# Patient Record
Sex: Female | Born: 1945 | Race: White | Hispanic: No | Marital: Married | Smoking: Former smoker
Health system: Southern US, Community
[De-identification: ages and names within clinical notes are randomized; demographics above are authoritative.]

## PROBLEM LIST (undated history)

## (undated) ENCOUNTER — Emergency Department: Admission: EM | Discharge status: Absent without leave | Payer: Medicare Other | Source: Home / Self Care

## (undated) DIAGNOSIS — D649 Anemia, unspecified: Secondary | ICD-10-CM

## (undated) DIAGNOSIS — Z8701 Personal history of pneumonia (recurrent): Secondary | ICD-10-CM

## (undated) DIAGNOSIS — R42 Dizziness and giddiness: Secondary | ICD-10-CM

## (undated) DIAGNOSIS — R011 Cardiac murmur, unspecified: Secondary | ICD-10-CM

## (undated) DIAGNOSIS — E119 Type 2 diabetes mellitus without complications: Secondary | ICD-10-CM

## (undated) DIAGNOSIS — E7800 Pure hypercholesterolemia, unspecified: Secondary | ICD-10-CM

## (undated) DIAGNOSIS — K589 Irritable bowel syndrome without diarrhea: Secondary | ICD-10-CM

## (undated) DIAGNOSIS — R51 Headache: Secondary | ICD-10-CM

## (undated) DIAGNOSIS — Z923 Personal history of irradiation: Secondary | ICD-10-CM

## (undated) DIAGNOSIS — H8109 Meniere's disease, unspecified ear: Secondary | ICD-10-CM

## (undated) DIAGNOSIS — K449 Diaphragmatic hernia without obstruction or gangrene: Secondary | ICD-10-CM

## (undated) DIAGNOSIS — C3412 Malignant neoplasm of upper lobe, left bronchus or lung: Secondary | ICD-10-CM

## (undated) DIAGNOSIS — Z9221 Personal history of antineoplastic chemotherapy: Secondary | ICD-10-CM

## (undated) DIAGNOSIS — G4730 Sleep apnea, unspecified: Secondary | ICD-10-CM

## (undated) DIAGNOSIS — M51369 Other intervertebral disc degeneration, lumbar region without mention of lumbar back pain or lower extremity pain: Secondary | ICD-10-CM

## (undated) DIAGNOSIS — K8020 Calculus of gallbladder without cholecystitis without obstruction: Secondary | ICD-10-CM

## (undated) DIAGNOSIS — Z9889 Other specified postprocedural states: Secondary | ICD-10-CM

## (undated) DIAGNOSIS — J45909 Unspecified asthma, uncomplicated: Secondary | ICD-10-CM

## (undated) DIAGNOSIS — K219 Gastro-esophageal reflux disease without esophagitis: Secondary | ICD-10-CM

## (undated) DIAGNOSIS — K5790 Diverticulosis of intestine, part unspecified, without perforation or abscess without bleeding: Secondary | ICD-10-CM

## (undated) DIAGNOSIS — J449 Chronic obstructive pulmonary disease, unspecified: Secondary | ICD-10-CM

## (undated) DIAGNOSIS — R112 Nausea with vomiting, unspecified: Secondary | ICD-10-CM

## (undated) DIAGNOSIS — M1990 Unspecified osteoarthritis, unspecified site: Secondary | ICD-10-CM

## (undated) DIAGNOSIS — M5136 Other intervertebral disc degeneration, lumbar region: Secondary | ICD-10-CM

## (undated) DIAGNOSIS — Z8489 Family history of other specified conditions: Secondary | ICD-10-CM

## (undated) DIAGNOSIS — J479 Bronchiectasis, uncomplicated: Secondary | ICD-10-CM

## (undated) DIAGNOSIS — R531 Weakness: Secondary | ICD-10-CM

## (undated) DIAGNOSIS — R519 Headache, unspecified: Secondary | ICD-10-CM

## (undated) DIAGNOSIS — G8929 Other chronic pain: Secondary | ICD-10-CM

## (undated) DIAGNOSIS — Z87442 Personal history of urinary calculi: Secondary | ICD-10-CM

## (undated) HISTORY — DX: Diverticulosis of intestine, part unspecified, without perforation or abscess without bleeding: K57.90

## (undated) HISTORY — DX: Sleep apnea, unspecified: G47.30

## (undated) HISTORY — PX: JOINT REPLACEMENT: SHX530

## (undated) HISTORY — DX: Pure hypercholesterolemia, unspecified: E78.00

## (undated) HISTORY — DX: Other chronic pain: G89.29

## (undated) HISTORY — PX: BREAST CYST ASPIRATION: SHX578

## (undated) HISTORY — DX: Meniere's disease, unspecified ear: H81.09

## (undated) HISTORY — PX: BREAST SURGERY: SHX581

## (undated) HISTORY — DX: Unspecified asthma, uncomplicated: J45.909

## (undated) HISTORY — PX: ABDOMINAL HYSTERECTOMY: SHX81

## (undated) HISTORY — DX: Headache, unspecified: R51.9

## (undated) HISTORY — PX: EXCISIONAL HEMORRHOIDECTOMY: SHX1541

## (undated) HISTORY — DX: Gastro-esophageal reflux disease without esophagitis: K21.9

## (undated) HISTORY — PX: RIGHT OOPHORECTOMY: SHX2359

## (undated) HISTORY — DX: Unspecified osteoarthritis, unspecified site: M19.90

## (undated) HISTORY — DX: Bronchiectasis, uncomplicated: J47.9

## (undated) HISTORY — PX: CHOLECYSTECTOMY: SHX55

## (undated) HISTORY — PX: BLADDER SURGERY: SHX569

## (undated) HISTORY — PX: APPENDECTOMY: SHX54

## (undated) HISTORY — DX: Headache: R51

## (undated) HISTORY — DX: Diaphragmatic hernia without obstruction or gangrene: K44.9

## (undated) HISTORY — PX: BACK SURGERY: SHX140

## (undated) HISTORY — PX: LUMBAR LAMINECTOMY: SHX95

## (undated) HISTORY — DX: Dizziness and giddiness: R42

## (undated) MED FILL — Dexamethasone Sodium Phosphate Inj 100 MG/10ML: INTRAMUSCULAR | Qty: 1 | Status: AC

---
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
## 1986-03-14 HISTORY — PX: BACK SURGERY: SHX140

## 1988-03-14 HISTORY — PX: REDUCTION MAMMAPLASTY: SUR839

## 2004-01-16 ENCOUNTER — Ambulatory Visit: Payer: Self-pay | Admitting: Internal Medicine

## 2004-03-31 ENCOUNTER — Ambulatory Visit: Payer: Self-pay | Admitting: Urology

## 2004-05-04 ENCOUNTER — Ambulatory Visit: Payer: Self-pay | Admitting: Internal Medicine

## 2005-05-05 ENCOUNTER — Ambulatory Visit: Payer: Self-pay | Admitting: Internal Medicine

## 2005-05-20 ENCOUNTER — Ambulatory Visit: Payer: Self-pay | Admitting: Urology

## 2005-08-17 ENCOUNTER — Emergency Department: Payer: Self-pay | Admitting: Unknown Physician Specialty

## 2005-08-17 ENCOUNTER — Other Ambulatory Visit: Payer: Self-pay

## 2005-10-12 ENCOUNTER — Ambulatory Visit: Payer: Self-pay | Admitting: Pain Medicine

## 2005-10-21 ENCOUNTER — Ambulatory Visit: Payer: Self-pay | Admitting: Pain Medicine

## 2006-02-22 ENCOUNTER — Ambulatory Visit: Payer: Self-pay | Admitting: Internal Medicine

## 2006-05-09 ENCOUNTER — Ambulatory Visit: Payer: Self-pay | Admitting: Internal Medicine

## 2007-07-13 ENCOUNTER — Inpatient Hospital Stay: Payer: Self-pay | Admitting: Specialist

## 2007-07-13 ENCOUNTER — Other Ambulatory Visit: Payer: Self-pay

## 2007-08-23 ENCOUNTER — Ambulatory Visit: Payer: Self-pay | Admitting: Internal Medicine

## 2007-10-16 ENCOUNTER — Ambulatory Visit: Payer: Self-pay | Admitting: Gastroenterology

## 2007-12-13 ENCOUNTER — Ambulatory Visit: Payer: Self-pay | Admitting: Gynecologic Oncology

## 2008-01-01 ENCOUNTER — Ambulatory Visit: Payer: Self-pay | Admitting: Gynecologic Oncology

## 2008-01-04 ENCOUNTER — Ambulatory Visit: Payer: Self-pay | Admitting: General Practice

## 2008-01-09 ENCOUNTER — Ambulatory Visit: Payer: Self-pay | Admitting: Gynecologic Oncology

## 2008-01-13 ENCOUNTER — Ambulatory Visit: Payer: Self-pay | Admitting: Gynecologic Oncology

## 2008-01-16 ENCOUNTER — Ambulatory Visit: Payer: Self-pay | Admitting: Gynecologic Oncology

## 2008-01-22 ENCOUNTER — Ambulatory Visit: Payer: Self-pay | Admitting: Gynecologic Oncology

## 2008-02-12 ENCOUNTER — Ambulatory Visit: Payer: Self-pay | Admitting: Gynecologic Oncology

## 2008-02-26 ENCOUNTER — Ambulatory Visit: Payer: Self-pay | Admitting: Gynecologic Oncology

## 2008-09-09 ENCOUNTER — Ambulatory Visit: Payer: Self-pay | Admitting: Internal Medicine

## 2009-01-31 ENCOUNTER — Emergency Department: Payer: Self-pay | Admitting: Emergency Medicine

## 2009-07-15 ENCOUNTER — Ambulatory Visit: Payer: Self-pay | Admitting: Urology

## 2009-07-28 ENCOUNTER — Ambulatory Visit: Payer: Self-pay | Admitting: Unknown Physician Specialty

## 2009-09-30 ENCOUNTER — Ambulatory Visit: Payer: Self-pay | Admitting: Internal Medicine

## 2010-03-16 ENCOUNTER — Ambulatory Visit: Payer: Self-pay | Admitting: Internal Medicine

## 2010-04-14 DEATH — deceased

## 2010-09-05 ENCOUNTER — Emergency Department: Payer: Self-pay | Admitting: Internal Medicine

## 2010-10-20 ENCOUNTER — Ambulatory Visit: Payer: Self-pay | Admitting: Internal Medicine

## 2011-01-14 ENCOUNTER — Ambulatory Visit: Payer: Self-pay | Admitting: Internal Medicine

## 2011-07-05 ENCOUNTER — Ambulatory Visit: Payer: Self-pay | Admitting: Specialist

## 2011-09-21 ENCOUNTER — Ambulatory Visit: Payer: Self-pay | Admitting: Internal Medicine

## 2011-09-21 ENCOUNTER — Emergency Department: Payer: Self-pay | Admitting: Unknown Physician Specialty

## 2011-09-22 LAB — BASIC METABOLIC PANEL
Anion Gap: 9 (ref 7–16)
BUN: 20 mg/dL — ABNORMAL HIGH (ref 7–18)
Calcium, Total: 9.2 mg/dL (ref 8.5–10.1)
Chloride: 106 mmol/L (ref 98–107)
Co2: 29 mmol/L (ref 21–32)
Creatinine: 0.62 mg/dL (ref 0.60–1.30)
EGFR (African American): >60
EGFR (Non-African Amer.): >60
Glucose: 105 mg/dL — ABNORMAL HIGH (ref 65–99)
Osmolality: 290 (ref 275–301)
Potassium: 4.7 mmol/L (ref 3.5–5.1)
Sodium: 144 mmol/L (ref 136–145)

## 2011-09-22 LAB — CBC
HCT: 37 % (ref 35.0–47.0)
HGB: 12.2 g/dL (ref 12.0–16.0)
MCH: 30.6 pg (ref 26.0–34.0)
MCHC: 32.9 g/dL (ref 32.0–36.0)
MCV: 93 fL (ref 80–100)
Platelet: 241 10*3/uL (ref 150–440)
RBC: 3.98 10*6/uL (ref 3.80–5.20)
RDW: 13.4 % (ref 11.5–14.5)
WBC: 4.4 10*3/uL (ref 3.6–11.0)

## 2011-09-22 LAB — LIPASE, BLOOD: Lipase: 223 U/L (ref 73–393)

## 2011-09-22 LAB — HEPATIC FUNCTION PANEL A (ARMC)
Albumin: 3.8 g/dL (ref 3.4–5.0)
Alkaline Phosphatase: 103 U/L (ref 50–136)
Bilirubin, Direct: <0.1 mg/dL (ref 0.00–0.20)
Bilirubin,Total: 0.2 mg/dL (ref 0.2–1.0)
SGOT(AST): 22 U/L (ref 15–37)
SGPT (ALT): 20 U/L
Total Protein: 6.4 g/dL (ref 6.4–8.2)

## 2011-09-22 LAB — TROPONIN I: Troponin-I: <0.02 ng/mL

## 2011-09-22 LAB — CK TOTAL AND CKMB (NOT AT ARMC)
CK, Total: 65 U/L (ref 21–215)
CK-MB: 1.1 ng/mL (ref 0.5–3.6)

## 2011-10-17 ENCOUNTER — Ambulatory Visit: Payer: Self-pay | Admitting: Gastroenterology

## 2011-10-21 ENCOUNTER — Ambulatory Visit: Payer: Self-pay | Admitting: Internal Medicine

## 2011-12-28 ENCOUNTER — Ambulatory Visit: Payer: Self-pay | Admitting: Gastroenterology

## 2012-01-10 ENCOUNTER — Ambulatory Visit: Payer: Self-pay | Admitting: Gastroenterology

## 2012-01-11 ENCOUNTER — Telehealth: Payer: Self-pay | Admitting: Internal Medicine

## 2012-01-11 LAB — PATHOLOGY REPORT

## 2012-01-11 NOTE — Telephone Encounter (Signed)
You can schedule her for 11/10 at 12:00 to discuss.  Thanks.

## 2012-01-11 NOTE — Telephone Encounter (Signed)
Appointment should be nov 13 @ 12

## 2012-01-11 NOTE — Telephone Encounter (Signed)
Spoke with Pt and scheduled her for 01/23/12 at 12:00  because 01/22/12 is a Sunday. She was fine with that.

## 2012-01-11 NOTE — Telephone Encounter (Signed)
Pt called wanting to get appointment with dr Lorin Picket She had a kidney left ultra sound, level for stomach  Cancer were elvated (labs) colonoscopy done yesterday Dr Kathryne Hitch (at hospital [facility]) order these test Please advise

## 2012-01-23 ENCOUNTER — Encounter: Payer: Self-pay | Admitting: Internal Medicine

## 2012-01-23 ENCOUNTER — Ambulatory Visit (INDEPENDENT_AMBULATORY_CARE_PROVIDER_SITE_OTHER): Payer: Medicare Other | Admitting: Internal Medicine

## 2012-01-23 VITALS — BP 113/73 | HR 73 | Temp 98.5°F | Ht 63.0 in | Wt 143.8 lb

## 2012-01-23 DIAGNOSIS — R350 Frequency of micturition: Secondary | ICD-10-CM

## 2012-01-23 DIAGNOSIS — R51 Headache: Secondary | ICD-10-CM

## 2012-01-23 DIAGNOSIS — K219 Gastro-esophageal reflux disease without esophagitis: Secondary | ICD-10-CM

## 2012-01-23 DIAGNOSIS — J479 Bronchiectasis, uncomplicated: Secondary | ICD-10-CM

## 2012-01-23 DIAGNOSIS — G8929 Other chronic pain: Secondary | ICD-10-CM

## 2012-01-23 DIAGNOSIS — G4730 Sleep apnea, unspecified: Secondary | ICD-10-CM

## 2012-01-23 DIAGNOSIS — R519 Headache, unspecified: Secondary | ICD-10-CM

## 2012-01-23 DIAGNOSIS — E7800 Pure hypercholesterolemia, unspecified: Secondary | ICD-10-CM

## 2012-01-23 DIAGNOSIS — M5030 Other cervical disc degeneration, unspecified cervical region: Secondary | ICD-10-CM

## 2012-01-23 LAB — POCT URINALYSIS DIPSTICK
Bilirubin, UA: NEGATIVE
Blood, UA: NEGATIVE
Glucose, UA: NEGATIVE
Ketones, UA: NEGATIVE
Leukocytes, UA: NEGATIVE
Nitrite, UA: NEGATIVE
Protein, UA: NEGATIVE
Spec Grav, UA: 1.02
Urobilinogen, UA: 0.2
pH, UA: 6.5

## 2012-01-23 NOTE — Patient Instructions (Addendum)
It was nice seeing you today.  I am sorry you have not been feeling well.  I am going to talk to Dr Doylene Canning and will let you know what he says.

## 2012-01-24 ENCOUNTER — Telehealth: Payer: Self-pay | Admitting: *Deleted

## 2012-01-24 NOTE — Telephone Encounter (Signed)
Opened in error

## 2012-01-24 NOTE — Progress Notes (Signed)
Left message for patient to return call.

## 2012-01-30 NOTE — Progress Notes (Signed)
Called and gave lab results to patient.  

## 2012-02-01 ENCOUNTER — Telehealth: Payer: Self-pay | Admitting: Internal Medicine

## 2012-02-01 DIAGNOSIS — R109 Unspecified abdominal pain: Secondary | ICD-10-CM

## 2012-02-01 DIAGNOSIS — R772 Abnormality of alphafetoprotein: Secondary | ICD-10-CM

## 2012-02-01 DIAGNOSIS — Z139 Encounter for screening, unspecified: Secondary | ICD-10-CM

## 2012-02-01 DIAGNOSIS — K838 Other specified diseases of biliary tract: Secondary | ICD-10-CM

## 2012-02-01 DIAGNOSIS — R935 Abnormal findings on diagnostic imaging of other abdominal regions, including retroperitoneum: Secondary | ICD-10-CM

## 2012-02-01 NOTE — Telephone Encounter (Signed)
Called patient to let her know we could not perform one of the labs she needed and that she can come by and get the lab form from up front.

## 2012-02-01 NOTE — Telephone Encounter (Signed)
Needs ct scheduled.

## 2012-02-02 ENCOUNTER — Other Ambulatory Visit: Payer: Medicare Other

## 2012-02-05 ENCOUNTER — Encounter: Payer: Self-pay | Admitting: Internal Medicine

## 2012-02-05 DIAGNOSIS — J479 Bronchiectasis, uncomplicated: Secondary | ICD-10-CM | POA: Insufficient documentation

## 2012-02-05 DIAGNOSIS — M5030 Other cervical disc degeneration, unspecified cervical region: Secondary | ICD-10-CM | POA: Insufficient documentation

## 2012-02-05 DIAGNOSIS — K219 Gastro-esophageal reflux disease without esophagitis: Secondary | ICD-10-CM | POA: Insufficient documentation

## 2012-02-05 DIAGNOSIS — G8929 Other chronic pain: Secondary | ICD-10-CM | POA: Insufficient documentation

## 2012-02-05 DIAGNOSIS — G4730 Sleep apnea, unspecified: Secondary | ICD-10-CM | POA: Insufficient documentation

## 2012-02-05 DIAGNOSIS — E7800 Pure hypercholesterolemia, unspecified: Secondary | ICD-10-CM | POA: Insufficient documentation

## 2012-02-05 NOTE — Assessment & Plan Note (Signed)
Followed at the [facility] clinic.

## 2012-02-05 NOTE — Progress Notes (Signed)
Subjective:    Patient ID: Jennifer Hodges, female    DOB: [birth year], 66 y.o.   MRN: [id number]  HPI 66 year old female with past history of hiatal hernia, esophageal ulceration, asthma, sleep apnea and mild bronchiectasis who comes in today as a work in to discuss a recent GI evaluation.  Back in July, she was having intermittent nausea, vomiting, etc.  Abdominal ultrasound was performed and revealed dilated common bile duct.  They suggested a possible ERCP.  She was referred to GI.  Saw [employer].  MRCP 10/17/11 revealed mild distension of biliary system most likely from prior cholecystectomy.  No obstructing abnormality.  She did have an indeterminate left renal lesion.  Renal ultrasound recommended.  CEA normal.  AFP slightly elevated - 11. Had a normal renal ultrasound 10/13.  Colonoscopy 01/10/12 revealed diverticulosis in the sigmoid colon and in the descending colon - otherwise normal.  Recommended repeat colonoscopy 10 years.  10/31/11 CXR normal.  Liver panel wnl.  She was referred here for evaluation of the slightly elevated AFP.  Repeat AFP - 11.5.  Bowels are doing better.  No significant abdominal pain currently.  She has noticed some decreased energy and some fatigue.  Eating and drinking ok.    Past Medical History  Diagnosis Date  . Hypercholesterolemia   . Sleep apnea   . Bronchiectasis     mild  . Asthma   . GERD (gastroesophageal reflux disease)     EGD 8/09- non bleeding erosive gastritis, documentd esophageal ulcerations.   . Diverticulosis   . Hiatal hernia   . Osteoarthritis     lumbar disc disease, left hip  . Chronic headaches     [facility]    Review of Systems Patient denies any headache, lightheadedness or dizziness.  No significant sinus or allergy symptoms.   No chest pain, tightness or palpitations.  No increased shortness of breath, cough or congestion.  No nausea or vomiting currently.  No significant abdominal pain or cramping  currently.  Bowels vary.  No urine change.        Objective:   Physical Exam Filed Vitals:   01/23/12 1159  BP: 113/73  Pulse: 73  Temp: 98.5 F (72.48 C)   66 year old female in no acute distress.   HEENT:  Nares - clear.  OP- without lesions or erythema.  NECK:  Supple, nontender.    HEART:  Appears to be regular. LUNGS:  Without crackles or wheezing audible.  Respirations even and unlabored.   RADIAL PULSE:  Equal bilaterally.  ABDOMEN:  Soft, nontender.  No audible abdominal bruit.  GU:  Normal external genitalia.  Vaginal vault without lesions.  Could not appreciate any adnexal masses or tenderness.    EXTREMITIES:  No increased edema to be present.                    Assessment & Plan:  ELEVATED AFP.  Nonspecific finding.  CEA normal.  Recent abdominal ultrasound, MRCP and colonoscopy as outlined.  Discussed with Dr Doylene Canning.  Will obtain CT abdomen given the intermittent abdominal pain, nausea, vomiting, elevated AFP and dilated common bile duct.  Also check CA 19-9.  If normal, will recheck AFP in 6 months.  Follow.  Currently symptoms have improved.    CARDIOVASCULAR.  Recent stress test 08/03/11 - EF 62% with no evidence of ischemia.  ECHO 08/04/11 revealed EF 60%, mild RAE and mild TR.  Sees Dr Lady Gary.  Currently asymptomatic.   GU/GYN.  Pelvic today.  She is s/p surgery for vaginal prolapse.  Also had a bladder suspension.  Sees Dr Achilles Dunk and GYN.  Follow.    HEALTH MAINTENANCE.  Physical 09/20/11.  GI as outlined.  S/P hysterectomy.  Mammogram 10/20/10 - Birads II.  Obtain latest mammo.  If not done for 2013 - will need to schedule.

## 2012-02-05 NOTE — Assessment & Plan Note (Signed)
CPAP.  Nasal cannula.  Follow.    

## 2012-02-05 NOTE — Assessment & Plan Note (Signed)
Has neck, shoulder and arm pain.  C-spine MRI revealed multilevel degenerative disc disease.  Takes flexeril and tramadol prn.  Follow.    

## 2012-02-05 NOTE — Assessment & Plan Note (Signed)
On Protonix.  Symptoms controlled currently.  Follow.  EGD 8/09 revealed non bleeding erosive gastritis with gastric mucosal abnormality.   

## 2012-02-05 NOTE — Assessment & Plan Note (Signed)
Low cholesterol diet and exercise.  Check lipid panel with next fasting labs.    

## 2012-02-05 NOTE — Assessment & Plan Note (Signed)
Mild.  Sees Dr Fleming.  CXR recently - no acute abnormality.    

## 2012-02-06 ENCOUNTER — Telehealth: Payer: Self-pay | Admitting: Internal Medicine

## 2012-02-06 NOTE — Telephone Encounter (Signed)
Patient wanting results from her mammogram.

## 2012-02-06 NOTE — Telephone Encounter (Signed)
Notify pt her CA 19-9 lab test is normal.  Ok.

## 2012-02-07 ENCOUNTER — Telehealth: Payer: Self-pay | Admitting: Internal Medicine

## 2012-02-07 NOTE — Telephone Encounter (Signed)
Called and gave lab results to patient.  

## 2012-02-07 NOTE — Telephone Encounter (Signed)
Notify pt we received a copy of her mammogram that was done 10/21/11.  You had said she called wanting results.  mammo ok.

## 2012-02-07 NOTE — Telephone Encounter (Signed)
Informed patient of mammogram results

## 2012-02-13 ENCOUNTER — Encounter: Payer: Self-pay | Admitting: Internal Medicine

## 2012-02-13 ENCOUNTER — Ambulatory Visit: Payer: Self-pay | Admitting: Internal Medicine

## 2012-02-15 ENCOUNTER — Telehealth: Payer: Self-pay | Admitting: Internal Medicine

## 2012-02-15 NOTE — Telephone Encounter (Signed)
Received CT report.  Pt notified of persistent biliary distention - similar to previous findings on MRCP.  Discussed with GI.  Dr Marva Panda to review scan and they will schedule a follow up appt for pt.  Pt notified and comfortable with this plan.

## 2012-02-24 ENCOUNTER — Encounter: Payer: Self-pay | Admitting: Internal Medicine

## 2012-03-14 HISTORY — PX: CARDIAC CATHETERIZATION: SHX172

## 2012-04-10 ENCOUNTER — Other Ambulatory Visit: Payer: Self-pay | Admitting: *Deleted

## 2012-04-12 MED ORDER — CYCLOBENZAPRINE HCL 10 MG PO TABS: 10.0000 mg | ORAL_TABLET | Freq: Every evening | ORAL | Status: DC | PRN

## 2012-05-02 ENCOUNTER — Ambulatory Visit: Payer: Self-pay | Admitting: Gastroenterology

## 2012-05-09 ENCOUNTER — Other Ambulatory Visit: Payer: Self-pay

## 2012-05-09 ENCOUNTER — Telehealth: Payer: Self-pay

## 2012-05-09 ENCOUNTER — Encounter: Payer: Self-pay | Admitting: Internal Medicine

## 2012-05-09 DIAGNOSIS — K869 Disease of pancreas, unspecified: Secondary | ICD-10-CM

## 2012-05-10 ENCOUNTER — Encounter (HOSPITAL_COMMUNITY): Payer: Self-pay | Admitting: Pharmacy Technician

## 2012-05-10 NOTE — Telephone Encounter (Signed)
Pt has been notified and meds reviewed pt will call after reviewing instructions received in the mail

## 2012-05-23 ENCOUNTER — Encounter (HOSPITAL_COMMUNITY): Payer: Self-pay | Admitting: *Deleted

## 2012-05-31 ENCOUNTER — Ambulatory Visit (HOSPITAL_COMMUNITY)
Admission: RE | Admit: 2012-05-31 | Discharge: 2012-05-31 | Disposition: A | Discharge status: Home / Self Care | Payer: Medicare Other | Source: Ambulatory Visit | Attending: Gastroenterology | Admitting: Gastroenterology

## 2012-05-31 ENCOUNTER — Encounter (HOSPITAL_COMMUNITY)
Admission: RE | Disposition: A | Discharge status: Home / Self Care | Payer: Self-pay | Source: Ambulatory Visit | Attending: Gastroenterology

## 2012-05-31 ENCOUNTER — Ambulatory Visit (HOSPITAL_COMMUNITY): Payer: Medicare Other | Admitting: Anesthesiology

## 2012-05-31 ENCOUNTER — Encounter (HOSPITAL_COMMUNITY): Payer: Self-pay | Admitting: *Deleted

## 2012-05-31 ENCOUNTER — Encounter (HOSPITAL_COMMUNITY): Payer: Self-pay | Admitting: Anesthesiology

## 2012-05-31 DIAGNOSIS — R935 Abnormal findings on diagnostic imaging of other abdominal regions, including retroperitoneum: Secondary | ICD-10-CM | POA: Insufficient documentation

## 2012-05-31 DIAGNOSIS — K869 Disease of pancreas, unspecified: Secondary | ICD-10-CM

## 2012-05-31 DIAGNOSIS — Z9089 Acquired absence of other organs: Secondary | ICD-10-CM | POA: Insufficient documentation

## 2012-05-31 DIAGNOSIS — K838 Other specified diseases of biliary tract: Secondary | ICD-10-CM | POA: Insufficient documentation

## 2012-05-31 DIAGNOSIS — K219 Gastro-esophageal reflux disease without esophagitis: Secondary | ICD-10-CM | POA: Insufficient documentation

## 2012-05-31 DIAGNOSIS — G4730 Sleep apnea, unspecified: Secondary | ICD-10-CM | POA: Insufficient documentation

## 2012-05-31 DIAGNOSIS — R933 Abnormal findings on diagnostic imaging of other parts of digestive tract: Secondary | ICD-10-CM

## 2012-05-31 DIAGNOSIS — E7800 Pure hypercholesterolemia, unspecified: Secondary | ICD-10-CM | POA: Insufficient documentation

## 2012-05-31 HISTORY — PX: EUS: SHX5427

## 2012-05-31 SURGERY — UPPER ENDOSCOPIC ULTRASOUND (EUS) LINEAR
Anesthesia: Monitor Anesthesia Care

## 2012-05-31 MED ORDER — LACTATED RINGERS IV SOLN
INTRAVENOUS | Status: DC | PRN
  Administered 2012-05-31: 12:00:00 via INTRAVENOUS

## 2012-05-31 MED ORDER — SODIUM CHLORIDE 0.9 % IV SOLN: INTRAVENOUS | Status: DC

## 2012-05-31 MED ORDER — KETAMINE HCL 10 MG/ML IJ SOLN
INTRAMUSCULAR | Status: DC | PRN
  Administered 2012-05-31 (×2): 25 mg via INTRAVENOUS

## 2012-05-31 MED ORDER — PROPOFOL 10 MG/ML IV BOLUS
INTRAVENOUS | Status: DC | PRN
  Administered 2012-05-31: 50 mg via INTRAVENOUS

## 2012-05-31 MED ORDER — PROPOFOL 10 MG/ML IV EMUL
INTRAVENOUS | Status: DC | PRN
  Administered 2012-05-31: 300 ug/kg/min via INTRAVENOUS

## 2012-05-31 MED ORDER — DEXAMETHASONE SODIUM PHOSPHATE 10 MG/ML IJ SOLN
INTRAMUSCULAR | Status: DC | PRN
  Administered 2012-05-31: 10 mg via INTRAVENOUS

## 2012-05-31 MED ORDER — LACTATED RINGERS IV SOLN
INTRAVENOUS | Status: DC
  Administered 2012-05-31: 12:00:00 via INTRAVENOUS

## 2012-05-31 MED ORDER — BUTAMBEN-TETRACAINE-BENZOCAINE 2-2-14 % EX AERO
INHALATION_SPRAY | CUTANEOUS | Status: DC | PRN
  Administered 2012-05-31: 2 via TOPICAL

## 2012-05-31 NOTE — Transfer of Care (Signed)
Immediate Anesthesia Transfer of Care Note  Patient: Jennifer Hodges  Procedure(s) Performed: Procedure(s): UPPER ENDOSCOPIC ULTRASOUND (EUS) LINEAR (N/A)  Patient Location: PACU  Anesthesia Type:MAC  Level of Consciousness: awake, alert , patient cooperative and responds to stimulation  Airway & Oxygen Therapy: Patient Spontanous Breathing and Patient connected to nasal cannula oxygen  Post-op Assessment: Report given to PACU RN, Post -op Vital signs reviewed and stable and Patient moving all extremities X 4  Post vital signs: Reviewed and stable  Complications: No apparent anesthesia complications

## 2012-05-31 NOTE — Op Note (Signed)
[facility] [address] [place], [address]   ENDOSCOPIC ULTRASOUND PROCEDURE REPORT  PATIENT: Jennifer Hodges, Jennifer Hodges  MR#: [id number] BIRTHDATE: Mar 12, 1946  GENDER: Female ENDOSCOPIST: Rachael Fee, MD REFERRED BY:  Barnetta Chapel, MD  at [facility] PROCEDURE DATE:  05/31/2012 PROCEDURE:   Upper EUS ASA CLASS:      Class II INDICATIONS:   remote open cholecystectomy; elevated AFP recent; outside imaging suggests dilated CBD (normal LFTs) and "tiny soft tissue density in the pancreatic head/ampulla cannot be excluded". MEDICATIONS: MAC sedation, administered by CRNA  DESCRIPTION OF PROCEDURE:   After the risks benefits and alternatives of the procedure were  explained, informed consent was obtained. The patient was then placed in the left, lateral, decubitus postion and IV sedation was administered. Throughout the procedure, the patients blood pressure, pulse and oxygen saturations were monitored continuously.  Under direct visualization, the Pentax Radial EUS [id number]  endoscope was introduced through the mouth  and advanced to the second portion of the duodenum .  Water was used as necessary to provide an acoustic interface.  Upon completion of the imaging, water was removed and the patient was sent to the recovery room in satisfactory condition.    Endoscopic findings: 1. Normal UGI tract  EUS findings: 1. Pancreatic parenchyma was normal; no discrete masses or signs of chronic pancreatitis 2. CBD was slightly dilated (up to 8mm), contained no filling defects 3. Main pancreatic duct was normal; non-dilated 4. Pancreatic divisim was excluded on this examination 5. Gallbladder surgically absent 6. Limited views of liver, spleen, portal and splenic vessels were all normal  Impression: Normal pancreas, normal post-cholecystectomy bile duct. Will communicate these findings with Dr.  Marva Panda.  _______________________________ eSigned:  Rachael Fee, MD 05/31/2012 1:04 PM

## 2012-05-31 NOTE — Preoperative (Signed)
Beta Blockers   Reason not to administer Beta Blockers:Not Applicable, not on home BB 

## 2012-05-31 NOTE — Anesthesia Postprocedure Evaluation (Signed)
Anesthesia Post Note  Patient: Jennifer Hodges  Procedure(s) Performed: Procedure(s) (LRB): UPPER ENDOSCOPIC ULTRASOUND (EUS) LINEAR (N/A)  Anesthesia type: MAC  Patient location: PACU  Post pain: Pain level controlled  Post assessment: Post-op Vital signs reviewed  Last Vitals: BP 125/97  Pulse 74  Temp(Src) 36.4 C (Oral)  Resp 16  Ht 5\' 2"  (1.575 m)  Wt 150 lb (68.04 kg)  BMI 27.43 kg/m2  SpO2 97%  Post vital signs: Reviewed  Level of consciousness: awake  Complications: No apparent anesthesia complications.

## 2012-05-31 NOTE — Anesthesia Preprocedure Evaluation (Addendum)
Anesthesia Evaluation  Patient identified by MRN, date of birth, ID band Patient awake    Reviewed: Allergy & Precautions, H&P , NPO status , Patient's Chart, lab work & pertinent test results  Airway Mallampati: II TM Distance: >3 FB Neck ROM: Full    Dental  (+) Dental Advisory Given   Pulmonary asthma , sleep apnea , former smoker,  breath sounds clear to auscultation        Cardiovascular negative cardio ROS  Rhythm:Regular Rate:Normal     Neuro/Psych  Headaches,  Neuromuscular disease negative psych ROS   GI/Hepatic Neg liver ROS, hiatal hernia, GERD-  Medicated,  Endo/Other  negative endocrine ROS  Renal/GU negative Renal ROS     Musculoskeletal negative musculoskeletal ROS (+)   Abdominal   Peds  Hematology negative hematology ROS (+)   Anesthesia Other Findings   Reproductive/Obstetrics negative OB ROS                          Anesthesia Physical Anesthesia Plan  ASA: II  Anesthesia Plan: MAC   Post-op Pain Management:    Induction: Intravenous  Airway Management Planned: Nasal Cannula and Simple Face Mask  Additional Equipment:   Intra-op Plan:   Post-operative Plan:   Informed Consent: I have reviewed the patients History and Physical, chart, labs and discussed the procedure including the risks, benefits and alternatives for the proposed anesthesia with the patient or authorized representative who has indicated his/her understanding and acceptance.   Dental advisory given  Plan Discussed with: CRNA  Anesthesia Plan Comments:         Anesthesia Quick Evaluation

## 2012-05-31 NOTE — H&P (Signed)
  HPI: This is a woman with elevatd AFP who has undergone multiple imaging tests. Outside MRI, CT scan shows dilated CBD (normal liver tests) and "tiny soft tissue density in teh pancreatic hea/ampulla cannot be excluded"    Past Medical History  Diagnosis Date  . Hypercholesterolemia   . Bronchiectasis     mild  . Asthma   . GERD (gastroesophageal reflux disease)     EGD 8/09- non bleeding erosive gastritis, documentd esophageal ulcerations.   . Diverticulosis   . Hiatal hernia   . Sleep apnea     cpap asked to bring mask and tubing  . Chronic headaches      [facility]  . Osteoarthritis     lumbar disc disease, left hip    Past Surgical History  Procedure Laterality Date  . Appendectomy    . Cholecystectomy    . Breast surgery      bilateral cysts removed  . Abdominal hysterectomy  age 89  . Right oophorectomy    . Excisional hemorrhoidectomy    . Lumbar laminectomy    . Back surgery      Current Facility-Administered Medications  Medication Dose Route Frequency Provider Last Rate Last Dose  . 0.9 %  sodium chloride infusion   Intravenous Continuous Rachael Fee, MD      . lactated ringers infusion   Intravenous Continuous Rachael Fee, MD        Allergies as of 05/09/2012 - Review Complete 02/05/2012  Allergen Reaction Noted  . Aspirin Other (See Comments) 01/23/2012  . Clarithromycin Nausea And Vomiting 01/23/2012  . Codeine Nausea And Vomiting 01/23/2012  . Darvon (propoxyphene hcl) Nausea And Vomiting 01/23/2012  . Demerol (meperidine) Nausea And Vomiting 01/23/2012  . Morphine and related Nausea And Vomiting 01/23/2012  . Talwin (pentazocine) Nausea And Vomiting 01/23/2012    Family History  Problem Relation Age of Onset  . Heart disease Mother     s/p stent  . Hypertension Mother   . Hypercholesterolemia Mother   . Diabetes Father   . Stomach cancer      uncle  . Breast cancer Neg Hx     History   Social History   . Marital Status: Married    Spouse Name: N/A    Number of Children: N/A  . Years of Education: N/A   Occupational History  . Not on file.   Social History Main Topics  . Smoking status: Former Smoker    Quit date: 06/13/2007  . Smokeless tobacco: Never Used  . Alcohol Use: No  . Drug Use: No  . Sexually Active: Not on file   Other Topics Concern  . Not on file   Social History Narrative  . No narrative on file      Physical Exam: BP 117/67  Pulse 74  Temp(Src) 98.4 F (36.9 C) (Oral)  Resp 13  Ht 5\' 2"  (1.575 m)  Wt 150 lb (68.04 kg)  BMI 27.43 kg/m2  SpO2 99% Constitutional: generally well-appearing Psychiatric: alert and oriented x3 Abdomen: soft, nontender, nondistended, no obvious ascites, no peritoneal signs, normal bowel sounds     Assessment and plan: 66 y.o. female with dilated CBD, normal LFTs  EUS today

## 2012-06-01 ENCOUNTER — Encounter (HOSPITAL_COMMUNITY): Payer: Self-pay | Admitting: Gastroenterology

## 2012-06-07 ENCOUNTER — Other Ambulatory Visit: Payer: Self-pay | Admitting: Neurosurgery

## 2012-06-07 DIAGNOSIS — M5030 Other cervical disc degeneration, unspecified cervical region: Secondary | ICD-10-CM

## 2012-06-07 DIAGNOSIS — M5412 Radiculopathy, cervical region: Secondary | ICD-10-CM

## 2012-06-07 DIAGNOSIS — M4802 Spinal stenosis, cervical region: Secondary | ICD-10-CM

## 2012-06-11 ENCOUNTER — Ambulatory Visit
Admission: RE | Admit: 2012-06-11 | Discharge: 2012-06-11 | Disposition: A | Discharge status: Home / Self Care | Payer: Medicare Other | Source: Ambulatory Visit | Attending: Neurosurgery | Admitting: Neurosurgery

## 2012-06-11 DIAGNOSIS — M4802 Spinal stenosis, cervical region: Secondary | ICD-10-CM

## 2012-06-11 DIAGNOSIS — M5030 Other cervical disc degeneration, unspecified cervical region: Secondary | ICD-10-CM

## 2012-06-11 DIAGNOSIS — M5412 Radiculopathy, cervical region: Secondary | ICD-10-CM

## 2012-06-18 ENCOUNTER — Telehealth: Payer: Self-pay | Admitting: Internal Medicine

## 2012-06-18 DIAGNOSIS — R5383 Other fatigue: Secondary | ICD-10-CM

## 2012-06-18 DIAGNOSIS — E7800 Pure hypercholesterolemia, unspecified: Secondary | ICD-10-CM

## 2012-06-18 NOTE — Telephone Encounter (Signed)
Patient wanting thyroid blood work done. Can an order be put into the system.

## 2012-06-18 NOTE — Telephone Encounter (Signed)
Please advise 

## 2012-06-19 NOTE — Telephone Encounter (Signed)
Need to know a reason why she wants thyroid checked.  What symptoms is she having? Need to know so that I can give a diagnosis code for order.  Also notify her she needs her cholesterol checked as well.  Let me know reason and I can place order.

## 2012-06-19 NOTE — Telephone Encounter (Signed)
I have ordered the labs for this pt.  Please schedule her a lab appt time.  Will need to fast for labs.  Thanks.

## 2012-06-19 NOTE — Telephone Encounter (Signed)
Patient notified and appointment scheduled. 

## 2012-06-19 NOTE — Telephone Encounter (Signed)
Called patient and her reason for her thyroid check is that when she woke up one morning she took her temperature up under her arm and her temperature was 94 and she read in a book that if your temperature is below 96 that your thyroid needs to be checked. She also has been exercising and has gained weight in the last couple months, she was 134 and is now 154. Her symptoms are that she has no energy and her nails are splitting down the middle. Also she is going bald at the top of her head, she said her whole body is cold all the time. Patient was informed that she needs her cholesterol checked and she is fine with that lab being done to.

## 2012-06-21 ENCOUNTER — Other Ambulatory Visit (INDEPENDENT_AMBULATORY_CARE_PROVIDER_SITE_OTHER): Payer: Medicare Other

## 2012-06-21 DIAGNOSIS — R5381 Other malaise: Secondary | ICD-10-CM

## 2012-06-21 DIAGNOSIS — E7800 Pure hypercholesterolemia, unspecified: Secondary | ICD-10-CM

## 2012-06-21 DIAGNOSIS — R5383 Other fatigue: Secondary | ICD-10-CM

## 2012-06-21 LAB — TSH: TSH: 1.84 u[IU]/mL (ref 0.35–5.50)

## 2012-06-21 LAB — CBC WITH DIFFERENTIAL/PLATELET
Basophils Absolute: 0 10*3/uL (ref 0.0–0.1)
Basophils Relative: 0.6 % (ref 0.0–3.0)
Eosinophils Absolute: 0.1 10*3/uL (ref 0.0–0.7)
Eosinophils Relative: 2.4 % (ref 0.0–5.0)
HCT: 39.9 % (ref 36.0–46.0)
Hemoglobin: 13.3 g/dL (ref 12.0–15.0)
Lymphocytes Relative: 37.8 % (ref 12.0–46.0)
Lymphs Abs: 1.7 10*3/uL (ref 0.7–4.0)
MCHC: 33.3 g/dL (ref 30.0–36.0)
MCV: 91.6 fl (ref 78.0–100.0)
Monocytes Absolute: 0.2 10*3/uL (ref 0.1–1.0)
Monocytes Relative: 5.7 % (ref 3.0–12.0)
Neutro Abs: 2.3 10*3/uL (ref 1.4–7.7)
Neutrophils Relative %: 53.5 % (ref 43.0–77.0)
Platelets: 253 10*3/uL (ref 150.0–400.0)
RBC: 4.35 Mil/uL (ref 3.87–5.11)
RDW: 13.9 % (ref 11.5–14.6)
WBC: 4.4 10*3/uL — ABNORMAL LOW (ref 4.5–10.5)

## 2012-06-21 LAB — COMPREHENSIVE METABOLIC PANEL
ALT: 25 U/L (ref 0–35)
AST: 28 U/L (ref 0–37)
Albumin: 4.1 g/dL (ref 3.5–5.2)
Alkaline Phosphatase: 99 U/L (ref 39–117)
BUN: 13 mg/dL (ref 6–23)
CO2: 27 mEq/L (ref 19–32)
Calcium: 9.3 mg/dL (ref 8.4–10.5)
Chloride: 105 mEq/L (ref 96–112)
Creatinine, Ser: 0.6 mg/dL (ref 0.4–1.2)
GFR: 105.61 mL/min (ref >60.00)
Glucose, Bld: 89 mg/dL (ref 70–99)
Potassium: 3.9 mEq/L (ref 3.5–5.1)
Sodium: 140 mEq/L (ref 135–145)
Total Bilirubin: 0.6 mg/dL (ref 0.3–1.2)
Total Protein: 6.9 g/dL (ref 6.0–8.3)

## 2012-06-21 LAB — LIPID PANEL
Cholesterol: 227 mg/dL — ABNORMAL HIGH (ref 0–200)
HDL: 63.6 mg/dL (ref >39.00)
Total CHOL/HDL Ratio: 4
Triglycerides: 107 mg/dL (ref 0.0–149.0)
VLDL: 21.4 mg/dL (ref 0.0–40.0)

## 2012-06-21 LAB — LDL CHOLESTEROL, DIRECT: Direct LDL: 137 mg/dL

## 2012-06-22 ENCOUNTER — Other Ambulatory Visit: Payer: Medicare Other

## 2012-07-11 DIAGNOSIS — R339 Retention of urine, unspecified: Secondary | ICD-10-CM | POA: Insufficient documentation

## 2012-07-11 DIAGNOSIS — N993 Prolapse of vaginal vault after hysterectomy: Secondary | ICD-10-CM | POA: Insufficient documentation

## 2012-07-19 ENCOUNTER — Telehealth: Payer: Self-pay | Admitting: Internal Medicine

## 2012-07-19 NOTE — Telephone Encounter (Signed)
Pt has 2 abscessed teeth, one worse than the other.  Dentist is going to do an implant.  Pt has evaluation w/ Dentist 6/28 with Dr. Donell Beers.  Pt has osteopenia and is asking about her previous bone density, thinks Dr. Donell Beers will need to know those results.  Pt asking if she will need another bone density and needs a referral for this before her implant is performed.

## 2012-07-20 NOTE — Telephone Encounter (Signed)
Pt aware.

## 2012-07-20 NOTE — Telephone Encounter (Signed)
Notify pt that I have called [facility] to request a copy of her last bone density.

## 2012-08-13 ENCOUNTER — Other Ambulatory Visit: Payer: Self-pay | Admitting: Internal Medicine

## 2012-08-13 NOTE — Telephone Encounter (Signed)
Okay to refill? 

## 2012-09-18 ENCOUNTER — Ambulatory Visit: Payer: Self-pay | Admitting: Gastroenterology

## 2012-09-21 ENCOUNTER — Telehealth: Payer: Self-pay | Admitting: Internal Medicine

## 2012-09-21 DIAGNOSIS — E2839 Other primary ovarian failure: Secondary | ICD-10-CM

## 2012-09-21 NOTE — Telephone Encounter (Signed)
Pt informed that Amber will be contacting her with the appt for her bone density test. Pt also informed to contact [facility] to schedule Mammo after 10/20/12. And pt states that she has had Chicken pox & would like the Shingles Vaccine Rx sent to [facility].

## 2012-09-21 NOTE — Telephone Encounter (Signed)
rx sent in for zostavax

## 2012-09-21 NOTE — Telephone Encounter (Signed)
I ordered her bone density.  She can call and schedule her mammogram - just needs to be after 10/21/11.  Regarding shingles vaccine - has she ever had chicken pox.  I can write rx as long as no contraindication.

## 2012-09-21 NOTE — Telephone Encounter (Signed)
Pt is needing mammo and bone density scheduled for [facility] and is wanting to get rx for shingles vaccine to have done.

## 2012-10-01 ENCOUNTER — Other Ambulatory Visit: Payer: Self-pay | Admitting: *Deleted

## 2012-10-01 NOTE — Telephone Encounter (Signed)
Pt states she went to [facility] last week and was told they had not received anything from [place] for her shingles vaccine.  Please send rx for shingles vaccine again.  Advised pt we had sent this on 7/11.  Pt asking to please contact her to let her know that this has been again.

## 2012-10-01 NOTE — Telephone Encounter (Signed)
Ok for shingles vaccine as long as no contraindication (confirm she is not living with anyone who is immunosuppressed - i.e, receiving chemo, etc.)   If not, ok

## 2012-10-01 NOTE — Telephone Encounter (Signed)
rx sent to [facility]

## 2012-10-01 NOTE — Telephone Encounter (Signed)
I could not find where it was sent

## 2012-10-01 NOTE — Telephone Encounter (Signed)
Pt has no contraindications-needs Rx sent to [facility]

## 2012-10-23 ENCOUNTER — Ambulatory Visit: Payer: Self-pay | Admitting: Internal Medicine

## 2012-10-23 LAB — HM MAMMOGRAPHY

## 2012-10-26 ENCOUNTER — Other Ambulatory Visit: Payer: Self-pay | Admitting: Internal Medicine

## 2012-10-26 NOTE — Telephone Encounter (Signed)
Okay to refill? 

## 2012-11-07 ENCOUNTER — Ambulatory Visit (INDEPENDENT_AMBULATORY_CARE_PROVIDER_SITE_OTHER): Payer: Medicare Other | Admitting: Internal Medicine

## 2012-11-07 ENCOUNTER — Encounter: Payer: Self-pay | Admitting: Internal Medicine

## 2012-11-07 VITALS — BP 110/80 | HR 75 | Temp 98.6°F | Ht 62.5 in | Wt 161.2 lb

## 2012-11-07 DIAGNOSIS — M5030 Other cervical disc degeneration, unspecified cervical region: Secondary | ICD-10-CM

## 2012-11-07 DIAGNOSIS — M810 Age-related osteoporosis without current pathological fracture: Secondary | ICD-10-CM

## 2012-11-07 DIAGNOSIS — K219 Gastro-esophageal reflux disease without esophagitis: Secondary | ICD-10-CM

## 2012-11-07 DIAGNOSIS — R933 Abnormal findings on diagnostic imaging of other parts of digestive tract: Secondary | ICD-10-CM

## 2012-11-07 DIAGNOSIS — J479 Bronchiectasis, uncomplicated: Secondary | ICD-10-CM

## 2012-11-07 DIAGNOSIS — K5900 Constipation, unspecified: Secondary | ICD-10-CM

## 2012-11-07 DIAGNOSIS — G8929 Other chronic pain: Secondary | ICD-10-CM

## 2012-11-07 DIAGNOSIS — Z780 Asymptomatic menopausal state: Secondary | ICD-10-CM

## 2012-11-07 DIAGNOSIS — G4730 Sleep apnea, unspecified: Secondary | ICD-10-CM

## 2012-11-07 DIAGNOSIS — E7800 Pure hypercholesterolemia, unspecified: Secondary | ICD-10-CM

## 2012-11-07 DIAGNOSIS — E559 Vitamin D deficiency, unspecified: Secondary | ICD-10-CM

## 2012-11-07 DIAGNOSIS — R51 Headache: Secondary | ICD-10-CM

## 2012-11-07 LAB — CBC WITH DIFFERENTIAL/PLATELET
Basophils Absolute: 0 10*3/uL (ref 0.0–0.1)
Basophils Relative: 0.6 % (ref 0.0–3.0)
Eosinophils Absolute: 0.1 10*3/uL (ref 0.0–0.7)
Eosinophils Relative: 2.2 % (ref 0.0–5.0)
HCT: 38.2 % (ref 36.0–46.0)
Hemoglobin: 13 g/dL (ref 12.0–15.0)
Lymphocytes Relative: 38 % (ref 12.0–46.0)
Lymphs Abs: 1.5 10*3/uL (ref 0.7–4.0)
MCHC: 34.1 g/dL (ref 30.0–36.0)
MCV: 91 fl (ref 78.0–100.0)
Monocytes Absolute: 0.3 10*3/uL (ref 0.1–1.0)
Monocytes Relative: 6.9 % (ref 3.0–12.0)
Neutro Abs: 2.1 10*3/uL (ref 1.4–7.7)
Neutrophils Relative %: 52.3 % (ref 43.0–77.0)
Platelets: 246 10*3/uL (ref 150.0–400.0)
RBC: 4.2 Mil/uL (ref 3.87–5.11)
RDW: 12.8 % (ref 11.5–14.6)
WBC: 4 10*3/uL — ABNORMAL LOW (ref 4.5–10.5)

## 2012-11-08 LAB — LIPID PANEL
Cholesterol: 215 mg/dL — ABNORMAL HIGH (ref 0–200)
HDL: 59.5 mg/dL (ref >39.00)
Total CHOL/HDL Ratio: 4
Triglycerides: 72 mg/dL (ref 0.0–149.0)
VLDL: 14.4 mg/dL (ref 0.0–40.0)

## 2012-11-08 LAB — BASIC METABOLIC PANEL
BUN: 15 mg/dL (ref 6–23)
CO2: 27 mEq/L (ref 19–32)
Calcium: 9.5 mg/dL (ref 8.4–10.5)
Chloride: 107 mEq/L (ref 96–112)
Creatinine, Ser: 0.6 mg/dL (ref 0.4–1.2)
GFR: 105.49 mL/min (ref >60.00)
Glucose, Bld: 89 mg/dL (ref 70–99)
Potassium: 4.1 mEq/L (ref 3.5–5.1)
Sodium: 141 mEq/L (ref 135–145)

## 2012-11-08 LAB — HEPATIC FUNCTION PANEL
ALT: 14 U/L (ref 0–35)
AST: 27 U/L (ref 0–37)
Albumin: 4.4 g/dL (ref 3.5–5.2)
Alkaline Phosphatase: 81 U/L (ref 39–117)
Bilirubin, Direct: 0.1 mg/dL (ref 0.0–0.3)
Total Bilirubin: 0.7 mg/dL (ref 0.3–1.2)
Total Protein: 7 g/dL (ref 6.0–8.3)

## 2012-11-08 LAB — TSH: TSH: 1.99 u[IU]/mL (ref 0.35–5.50)

## 2012-11-08 LAB — VITAMIN D 25 HYDROXY (VIT D DEFICIENCY, FRACTURES): Vit D, 25-Hydroxy: 41 ng/mL (ref 30–89)

## 2012-11-09 LAB — LDL CHOLESTEROL, DIRECT: Direct LDL: 147.5 mg/dL

## 2012-11-12 ENCOUNTER — Encounter: Payer: Self-pay | Admitting: Internal Medicine

## 2012-11-12 DIAGNOSIS — M810 Age-related osteoporosis without current pathological fracture: Secondary | ICD-10-CM | POA: Insufficient documentation

## 2012-11-12 NOTE — Assessment & Plan Note (Signed)
Check bone density.  Has declined treatment in the past.  Check vitamin D level.     

## 2012-11-12 NOTE — Assessment & Plan Note (Signed)
CPAP.  Nasal cannula.  Follow.

## 2012-11-12 NOTE — Progress Notes (Signed)
Subjective:    Patient ID: Jennifer Hodges, female    DOB: [birth year], 67 y.o.   MRN: [id number]  HPI 67 year old female with past history of hiatal hernia, esophageal ulceration, asthma, sleep apnea and mild bronchiectasis who comes in today to follow up on these issues as well as for a complete physical exam. In July 2013,  she was having intermittent nausea, vomiting, etc.  Abdominal ultrasound was performed and revealed dilated common bile duct.  They suggested a possible ERCP.  She was referred to GI.  Saw [facility].  MRCP 10/17/11 revealed mild distension of biliary system most likely from prior cholecystectomy.  No obstructing abnormality.  She did have an indeterminate left renal lesion.  Renal ultrasound recommended.  CEA normal.  AFP slightly elevated - 11. Had a normal renal ultrasound 10/13.  Colonoscopy 01/10/12 revealed diverticulosis in the sigmoid colon and in the descending colon - otherwise normal.  Recommended repeat colonoscopy 10 years.  10/31/11 CXR normal.  Liver panel wnl.  Bowels are doing better.  No significant abdominal pain currently.  She has noticed some decreased energy and some fatigue.  Eating and drinking ok.  She has continued to be followed by GI for elevated AFP.  States she just had f/u ultrasound that was ok.  Recommended f/u labs in 3 months and f/u with GI in 6 months.  States she is still having spasms with her bowel movements.  Takes hyoscyamine prn.  Helps.  She also reports pain in her arms.  Saw Dr Samara Deist.  Wearing gloves.  Recommended no surgery.  Gloves helping.  Still seeing Dr Neale Burly for her headaches.  On topamax.  Helps.  Has f/u next month.  She just had a stress test and f/u with Dr Lady Gary.  Heart checked out fine.  Feels better since being off Amitiza.  Taking miralax now.  Overall she feels things are stable.    Past Medical History  Diagnosis Date  . Hypercholesterolemia   . Bronchiectasis     mild  . Asthma   . GERD (gastroesophageal reflux  disease)     EGD 8/09- non bleeding erosive gastritis, documentd esophageal ulcerations.   . Diverticulosis   . Hiatal hernia   . Sleep apnea     cpap asked to bring mask and tubing  . Chronic headaches      followed by [facility]  . Osteoarthritis     lumbar disc disease, left hip    Current Outpatient Prescriptions on File Prior to Visit  Medication Sig Dispense Refill  . ASTRAGALUS PO Take 400 mg by mouth daily.      . Biotin 1 MG CAPS Take 1 mg by mouth daily.      . Calcium Citrate (CITRACAL PO) Take 1 tablet by mouth daily.      . Coenzyme Q10 (CO Q 10) 100 MG CAPS Take 100 mg by mouth daily.      . cyclobenzaprine (FLEXERIL) 10 MG tablet TAKE 1 TABLET AT BEDTIME AS NEEDED  90 tablet  0  . fish oil-omega-3 fatty acids 1000 MG capsule Take 1 g by mouth daily.      . folic acid (FOLVITE) 400 MCG tablet Take 400 mcg by mouth daily.      . Garlic 1000 MG CAPS Take 1,000 mg by mouth daily.      . hyoscyamine (LEVSIN, ANASPAZ) 0.125 MG tablet Take 0.125 mg by mouth every 4 (four) hours as needed for cramping.      Marland Kitchen  Lysine HCl (L-FORMULA LYSINE HCL) 500 MG TABS Take 500 mg by mouth daily.      . magnesium oxide (MAG-OX) 400 MG tablet Take 750 mg by mouth daily.       . Misc Natural Products (DETOX PO) Take 1 capsule by mouth daily.      . pantoprazole (PROTONIX) 40 MG tablet Take 40 mg by mouth daily.      Marland Kitchen pyridOXINE (VITAMIN B-6) 100 MG tablet Take 100 mg by mouth daily.      . Red Yeast Rice 600 MG CAPS Take by mouth daily.      . riboflavin (VITAMIN B-2) 100 MG TABS Take 200 mg by mouth daily.      Marland Kitchen terbinafine (LAMISIL) 250 MG tablet Take 250 mg by mouth daily.      Marland Kitchen topiramate (TOPAMAX) 25 MG tablet Take 75 mg by mouth at bedtime.      . traMADol (ULTRAM) 50 MG tablet Take 50 mg by mouth every 6 (six) hours as needed for pain.      . vitamin B-12 (CYANOCOBALAMIN) 1000 MCG tablet Take 1,000 mcg by mouth daily.      . vitamin C (ASCORBIC ACID) 500 MG tablet Take  500 mg by mouth 3 (three) times daily.      . vitamin E (VITAMIN E) 400 UNIT capsule Take 400 Units by mouth daily.       No current facility-administered medications on file prior to visit.    Review of Systems Patient denies any headache, lightheadedness or dizziness.  No sinus or allergy symptoms.  No chest pain, tightness or palpitations.  No increased shortness of breath, cough or congestion.  No nausea or vomiting currently.  No acid reflux.  No significant abdominal pain or cramping currently.  Bowels vary.  No urine change.   Arm pain as outlined.  On topamax for headaches.  On miralax.       Objective:   Physical Exam  Filed Vitals:   11/07/12 0929  BP: 110/80  Pulse: 75  Temp: 98.6 F (59 C)   67 year old female in no acute distress.   HEENT:  Nares- clear.  Oropharynx - without lesions. NECK:  Supple.  Nontender.  No audible bruit.  HEART:  Appears to be regular. LUNGS:  No crackles or wheezing audible.  Respirations even and unlabored.  RADIAL PULSE:  Equal bilaterally.    BREASTS:  No nipple discharge or nipple retraction present.  Could not appreciate any distinct nodules or axillary adenopathy.  ABDOMEN:  Soft, nontender.  Bowel sounds present and normal.  No audible abdominal bruit.   EXTREMITIES:  No increased edema present.  DP pulses palpable and equal bilaterally.           Assessment & Plan:  ELEVATED AFP.  Nonspecific finding.  CEA normal.  Recent abdominal ultrasound, MRCP and colonoscopy as outlined.  Discussed with Dr Doylene Canning.  CT abdomen revealed persistent biliary duct distension.  CA 19-9 normal.  Following with GI.  They are following ultrasound and AFP.  Obtain latest records.  Currently symptoms have improved.    CARDIOVASCULAR.  Recent stress test per pt report with no evidence of ischemia.  ECHO 08/04/11 revealed EF 60%, mild RAE and mild TR.  Sees Dr Lady Gary.  Currently asymptomatic.   GU/GYN.  She is s/p surgery for vaginal prolapse.  Also had a  bladder suspension.  Sees Dr Achilles Dunk and GYN.  Follow.    HEALTH MAINTENANCE.  Physical  today.  GI as outlined.  S/P hysterectomy.  Mammogram 10/23/12 - Birads II.

## 2012-11-12 NOTE — Assessment & Plan Note (Signed)
Has neck, shoulder and arm pain.  C-spine MRI revealed multilevel degenerative disc disease.  Takes flexeril and tramadol prn.  Follow.

## 2012-11-12 NOTE — Assessment & Plan Note (Signed)
Followed at the [facility].  On topamax.

## 2012-11-12 NOTE — Assessment & Plan Note (Signed)
Mild.  Sees Dr Meredeth Ide.  CXR recently - no acute abnormality.

## 2012-11-12 NOTE — Assessment & Plan Note (Signed)
On Protonix.  Symptoms controlled currently.  Follow.  EGD 8/09 revealed non bleeding erosive gastritis with gastric mucosal abnormality.   

## 2012-11-12 NOTE — Assessment & Plan Note (Signed)
Low cholesterol diet and exercise.  Check lipid panel with next fasting labs.    

## 2012-11-13 ENCOUNTER — Encounter: Payer: Self-pay | Admitting: *Deleted

## 2012-11-26 ENCOUNTER — Ambulatory Visit: Payer: Self-pay | Admitting: Cardiology

## 2012-12-18 ENCOUNTER — Telehealth: Payer: Self-pay | Admitting: *Deleted

## 2012-12-18 NOTE — Telephone Encounter (Signed)
Had dome SOB yesterday [place]. Currently wheezing & snoring with her CPAP on because she has been lying on her back. The pain just come & go about once a day. Feel like a electrical pain. Pt okay to wait for an appt with you. Please advise when you would like to see her.

## 2012-12-18 NOTE — Telephone Encounter (Signed)
See if she can come in 12/21/12 at 11:30.  If any problems before - eval.

## 2012-12-18 NOTE — Telephone Encounter (Signed)
Need to confirm specifically what symptoms she is currently having.  Any sob or other acute problems?  If acute issues, needs eval today.  I also need Dr America Brown records and cath report to confirm normal.  If no acute issues, can schedule an appt with me.

## 2012-12-18 NOTE — Telephone Encounter (Signed)
Pt states that she had a heart cath last month by Dr. Lady Gary & was told that her arteries were all clear now. Still C/O  Chest pain & heart pain. Was told that she needed to let you know. She is scheduled to see Dr. Mayo Ao on 10/23. Please advise

## 2012-12-18 NOTE — Telephone Encounter (Signed)
Aptt scheduled & pt notified. Records requested from Dr. Lady Gary office also

## 2012-12-19 NOTE — Telephone Encounter (Signed)
Dr. Lady Gary records requested & received. Will hold for appt

## 2012-12-21 ENCOUNTER — Encounter: Payer: Self-pay | Admitting: Internal Medicine

## 2012-12-21 ENCOUNTER — Encounter (INDEPENDENT_AMBULATORY_CARE_PROVIDER_SITE_OTHER): Payer: Self-pay

## 2012-12-21 ENCOUNTER — Ambulatory Visit (INDEPENDENT_AMBULATORY_CARE_PROVIDER_SITE_OTHER): Payer: Medicare Other | Admitting: Internal Medicine

## 2012-12-21 VITALS — BP 106/58 | HR 84 | Temp 98.9°F | Resp 12 | Wt 160.0 lb

## 2012-12-21 DIAGNOSIS — M810 Age-related osteoporosis without current pathological fracture: Secondary | ICD-10-CM

## 2012-12-21 DIAGNOSIS — G4730 Sleep apnea, unspecified: Secondary | ICD-10-CM

## 2012-12-21 DIAGNOSIS — E7800 Pure hypercholesterolemia, unspecified: Secondary | ICD-10-CM

## 2012-12-21 DIAGNOSIS — K219 Gastro-esophageal reflux disease without esophagitis: Secondary | ICD-10-CM

## 2012-12-21 DIAGNOSIS — R51 Headache: Secondary | ICD-10-CM

## 2012-12-21 DIAGNOSIS — M5030 Other cervical disc degeneration, unspecified cervical region: Secondary | ICD-10-CM

## 2012-12-21 DIAGNOSIS — J479 Bronchiectasis, uncomplicated: Secondary | ICD-10-CM

## 2012-12-21 DIAGNOSIS — G8929 Other chronic pain: Secondary | ICD-10-CM

## 2012-12-24 ENCOUNTER — Encounter: Payer: Self-pay | Admitting: Internal Medicine

## 2012-12-24 NOTE — Progress Notes (Signed)
Subjective:    Patient ID: Jennifer Hodges, female    DOB: 02/25/1946, 67 y.o.   MRN: [id number]  Chest Pain   67 year old female with past history of hiatal hernia, esophageal ulceration, asthma, sleep apnea and mild bronchiectasis who comes in today as a work in to discuss her persistent chest pain.  To review her previous w/up :  An abdominal ultrasound was performed and revealed dilated common bile duct.  They suggested a possible ERCP.  She was referred to GI.  Saw [facility].  MRCP 10/17/11 revealed mild distension of biliary system most likely from prior cholecystectomy.  No obstructing abnormality.  She did have an indeterminate left renal lesion.  Renal ultrasound recommended.  CEA normal.  AFP slightly elevated - 11. Had a normal renal ultrasound 10/13.  Colonoscopy 01/10/12 revealed diverticulosis in the sigmoid colon and in the descending colon - otherwise normal.  Recommended repeat colonoscopy 10 years.  10/31/11 CXR normal.  Liver panel wnl.  Bowels are doing better.  She felt the amitiza was causing some of her problems and reported feeling better off the amitiza.  No significant abdominal pain currently.    Eating and drinking ok.  She has continued to be followed by GI for elevated AFP.  States she just had f/u ultrasound that was ok.  States she is still having spasms with her bowel movements.  Takes hyoscyamine prn.  Helps.  She also reports pain in her arms.  Saw Dr Samara Deist.  Wearing gloves.  Recommended no surgery.  Gloves helping.  Still seeing Dr Neale Burly for her headaches.  On topamax.  Helps.  She just had a stress test and f/u with Dr Lady Gary.  Was continuing to have chest discomfort and Dr Lady Gary subsequently performed a heart catheterization.  Arteries per Dr America Brown report were normal.  They did not feel her chest discomfort was coming from a cardiac standpoint.  She states the dull aching she was having in her chest has resolved.  She noticed a few days ago - an "electrical" sensation  across her chest.  Lasted for a brief period and resolved.  No pain now.  Still seeing Dr Meredeth Ide for her pulmonary issues and her sleep apnea.     Past Medical History  Diagnosis Date  . Hypercholesterolemia   . Bronchiectasis     mild  . Asthma   . GERD (gastroesophageal reflux disease)     EGD 8/09- non bleeding erosive gastritis, documentd esophageal ulcerations.   . Diverticulosis   . Hiatal hernia   . Sleep apnea     cpap asked to bring mask and tubing  . Chronic headaches      followed by [facility]  . Osteoarthritis     lumbar disc disease, left hip    Current Outpatient Prescriptions on File Prior to Visit  Medication Sig Dispense Refill  . ASTRAGALUS PO Take 400 mg by mouth daily.      . Biotin 1 MG CAPS Take 1 mg by mouth daily.      . Boric Acid CRYS 3 mg by Does not apply route.      . Calcium Citrate (CITRACAL PO) Take 1 tablet by mouth daily.      . Cholecalciferol (VITAMIN D PO) Take 800 Units by mouth.      . Coenzyme Q10 (CO Q 10) 100 MG CAPS Take 100 mg by mouth daily.      . cyclobenzaprine (FLEXERIL) 10 MG tablet TAKE 1  TABLET AT BEDTIME AS NEEDED  90 tablet  0  . fish oil-omega-3 fatty acids 1000 MG capsule Take 1 g by mouth daily.      . folic acid (FOLVITE) 400 MCG tablet Take 400 mcg by mouth daily.      . Garlic 1000 MG CAPS Take 1,000 mg by mouth daily.      . Glucos-Chondroit-Hyaluron-MSM (GLUCOSAMINE CHONDROITIN JOINT PO) Take by mouth.      . hyoscyamine (LEVSIN, ANASPAZ) 0.125 MG tablet Take 0.125 mg by mouth every 4 (four) hours as needed for cramping.      Marland Kitchen Lysine HCl (L-FORMULA LYSINE HCL) 500 MG TABS Take 500 mg by mouth daily.      . magnesium oxide (MAG-OX) 400 MG tablet Take 750 mg by mouth daily.       Marland Kitchen MELATONIN PO Take by mouth.      . Misc Natural Products (DETOX PO) Take 1 capsule by mouth daily.      . Multiple Vitamins-Minerals (ONE-A-DAY WOMENS 50 PLUS PO) Take by mouth.      . pantoprazole (PROTONIX) 40 MG tablet  Take 40 mg by mouth daily.      . promethazine (PHENERGAN) 25 MG tablet Take 25 mg by mouth every 6 (six) hours as needed for nausea.      Marland Kitchen pyridOXINE (VITAMIN B-6) 100 MG tablet Take 100 mg by mouth daily.      . Red Yeast Rice 600 MG CAPS Take by mouth daily.      . riboflavin (VITAMIN B-2) 100 MG TABS Take 200 mg by mouth daily.      Marland Kitchen topiramate (TOPAMAX) 25 MG tablet Take 75 mg by mouth at bedtime.      . traMADol (ULTRAM) 50 MG tablet Take 50 mg by mouth every 6 (six) hours as needed for pain.      . vitamin B-12 (CYANOCOBALAMIN) 1000 MCG tablet Take 1,000 mcg by mouth daily.      . vitamin C (ASCORBIC ACID) 500 MG tablet Take 500 mg by mouth 3 (three) times daily.      . vitamin E (VITAMIN E) 400 UNIT capsule Take 400 Units by mouth daily.      . Vitamin Mixture (ESTER-C PO) Take 1,000 mg by mouth 3 (three) times daily.       No current facility-administered medications on file prior to visit.    Review of Systems  Cardiovascular: Positive for chest pain.  Patient denies any lightheadedness or dizziness.  No sinus or allergy symptoms.  No chest pain, tightness or palpitations No increased sob.  Breathing stable.  Still having issues with her CPAP.  States the nasal cannula irritates.  Plans to discuss with Dr Meredeth Ide.  The chest aching has resolved.  See above.  Still with headaches.  Plans to discuss with her neurologist.  Eating and drinking well.  Her bowel symptoms appear to be better.  See above.      Objective:   Physical Exam  Filed Vitals:   12/21/12 1124  BP: 106/58  Pulse: 84  Temp: 98.9 F (37.2 C)  Resp: 12   67 year old female in no acute distress.   HEENT:  Nares- clear.  Oropharynx - without lesions. NECK:  Supple.  Nontender.  No audible bruit.  HEART:  Appears to be regular. LUNGS:  No crackles or wheezing audible.  Respirations even and unlabored.  RADIAL PULSE:  Equal bilaterally.    ABDOMEN:  Soft, nontender.  Bowel  sounds present and normal.  No  audible abdominal bruit.   EXTREMITIES:  No increased edema present.  DP pulses palpable and equal bilaterally.           Assessment & Plan:  ELEVATED AFP.  Nonspecific finding.  CEA normal.  Recent abdominal ultrasound, MRCP and colonoscopy as outlined.  Discussed with Dr Doylene Canning.  CT abdomen revealed persistent biliary duct distension.  CA 19-9 normal.  Following with GI.  They are following ultrasound and AFP.  Obtain latest records.  Currently symptoms have improved.    CARDIOVASCULAR.  Recent stress test per pt report with no evidence of ischemia.  ECHO 08/04/11 revealed EF 60%, mild RAE and mild TR.  Cardiac catheterization with normal coronaries.    GU/GYN.  She is s/p surgery for vaginal prolapse.  Also had a bladder suspension.  Sees Dr Achilles Dunk and GYN.  Follow.    HEALTH MAINTENANCE.  GI as outlined.  S/P hysterectomy.  Mammogram 10/23/12 - Birads II.   Physical last visit.    I spent 45 minutes with the patient and more than 50% of the time was spent in consultation regarding the above.

## 2012-12-30 ENCOUNTER — Encounter: Payer: Self-pay | Admitting: Internal Medicine

## 2012-12-30 NOTE — Assessment & Plan Note (Signed)
Has neck, shoulder and arm pain.  C-spine MRI revealed multilevel degenerative disc disease.  Takes flexeril and tramadol prn.  Follow.  We discussed decreasing her dose of tramadol and using tylenol.  Follow.

## 2012-12-30 NOTE — Assessment & Plan Note (Signed)
Low cholesterol diet and exercise.  Check lipid panel with next fasting labs.    

## 2012-12-30 NOTE — Assessment & Plan Note (Signed)
CPAP.  Nasal cannula - still having issues.  Plans to discuss with Dr Meredeth Ide.

## 2012-12-30 NOTE — Assessment & Plan Note (Signed)
Mild.  Sees Dr Meredeth Ide.  CXR recently - no acute abnormality.   Breathing stable.

## 2012-12-30 NOTE — Assessment & Plan Note (Signed)
On Protonix.  Symptoms controlled currently.  Follow.  EGD 8/09 revealed non bleeding erosive gastritis with gastric mucosal abnormality.   

## 2012-12-30 NOTE — Assessment & Plan Note (Signed)
Check bone density.  Has declined treatment in the past.  Check vitamin D level.     

## 2012-12-30 NOTE — Assessment & Plan Note (Signed)
Followed at the [facility].  On topamax.

## 2013-01-11 ENCOUNTER — Other Ambulatory Visit: Payer: Self-pay | Admitting: Internal Medicine

## 2013-01-11 NOTE — Telephone Encounter (Signed)
Refilled flexeril #90 with no refills.

## 2013-01-11 NOTE — Telephone Encounter (Signed)
Refill

## 2013-02-25 ENCOUNTER — Telehealth: Payer: Self-pay | Admitting: Internal Medicine

## 2013-02-25 DIAGNOSIS — M542 Cervicalgia: Secondary | ICD-10-CM

## 2013-02-25 DIAGNOSIS — M25519 Pain in unspecified shoulder: Secondary | ICD-10-CM

## 2013-02-25 NOTE — Telephone Encounter (Signed)
She has problems taking multiple pain medications.  She has flexeril.  If she does not want to take tramadol, then would suggest tylenol with the muscle relaxer.  If increased pain and problems with this, then can refer her to Dr Yves Dill for pain management to see if any other means to control her pain (i.e., injections, etc.).

## 2013-02-25 NOTE — Telephone Encounter (Signed)
Pt states she is on tramadol and she wants to come off of it due to studies showing it may cause Alzheimer's.  States she heard there is a new medication that starts with an "H" that she would like to try, or another medication that Dr. Lorin Picket recommends.  States she is almost out of tramadol.

## 2013-02-25 NOTE — Telephone Encounter (Signed)
Please advise 

## 2013-02-26 NOTE — Telephone Encounter (Signed)
Referral placed for Dr Yves Dill

## 2013-02-26 NOTE — Telephone Encounter (Signed)
LMTCB

## 2013-02-26 NOTE — Telephone Encounter (Signed)
Pt states that she has already tried taking Tylenol with her muscle relaxer & hasn't noticed any improvements. She is concerned about some mild memory issues & feels that they are related to taking the Tramadol. She would like to proceed with referral to Dr. Yves Dill (will be out of the Tramadol soon). Pt also states that she never received an appt for her bone density either (she believes that she is overdue)

## 2013-03-12 ENCOUNTER — Encounter: Payer: Self-pay | Admitting: Emergency Medicine

## 2013-03-14 HISTORY — PX: CATARACT EXTRACTION W/ INTRAOCULAR LENS IMPLANT: SHX1309

## 2013-03-15 DIAGNOSIS — IMO0002 Reserved for concepts with insufficient information to code with codable children: Secondary | ICD-10-CM | POA: Diagnosis not present

## 2013-03-15 DIAGNOSIS — M1710 Unilateral primary osteoarthritis, unspecified knee: Secondary | ICD-10-CM | POA: Diagnosis not present

## 2013-03-15 DIAGNOSIS — M25569 Pain in unspecified knee: Secondary | ICD-10-CM | POA: Diagnosis not present

## 2013-03-18 DIAGNOSIS — IMO0002 Reserved for concepts with insufficient information to code with codable children: Secondary | ICD-10-CM | POA: Diagnosis not present

## 2013-03-18 DIAGNOSIS — M1710 Unilateral primary osteoarthritis, unspecified knee: Secondary | ICD-10-CM | POA: Diagnosis not present

## 2013-03-20 ENCOUNTER — Other Ambulatory Visit: Payer: Self-pay | Admitting: Internal Medicine

## 2013-03-20 NOTE — Telephone Encounter (Signed)
LMTCB

## 2013-03-20 NOTE — Telephone Encounter (Signed)
Per last phone message, she was concerned about continuing to take tramadol.  Wanted a referral to Dr Sharlet Salina.  Is she seeing Dr Sharlet Salina now.  He takes over prescribing pain meds - if she is seeing him.  Thanks.

## 2013-03-20 NOTE — Telephone Encounter (Signed)
Pt states that her appt with Dr. Sharlet Salina was moved to 04/02/13. She does not have enough Tramadol to last that long & she is out of the Flexeril also.

## 2013-03-20 NOTE — Telephone Encounter (Signed)
Okay to refill? 

## 2013-03-20 NOTE — Telephone Encounter (Signed)
Can refill tramadol #60 with no refills and flexeril #30 with no refills.  She hs two local pharmacies.  Did not know where to send this.  Will do 30 day supply and see if he changes her medication.  This will get her through to his appt.

## 2013-03-20 NOTE — Telephone Encounter (Signed)
The patient is needing her  traMADol (ULTRAM) 50 MG tablet  and Flexeril called to [facility] in [facility]

## 2013-03-21 ENCOUNTER — Other Ambulatory Visit: Payer: Self-pay | Admitting: *Deleted

## 2013-03-21 NOTE — Telephone Encounter (Signed)
LMTCB-needing to know which pharmacy to send medications to.

## 2013-03-22 ENCOUNTER — Other Ambulatory Visit: Payer: Self-pay | Admitting: *Deleted

## 2013-03-22 ENCOUNTER — Telehealth: Payer: Self-pay | Admitting: Internal Medicine

## 2013-03-22 MED ORDER — TRAMADOL HCL 50 MG PO TABS: 50.0000 mg | ORAL_TABLET | Freq: Four times a day (QID) | ORAL | Status: DC | PRN

## 2013-03-22 MED ORDER — CYCLOBENZAPRINE HCL 10 MG PO TABS: 10.0000 mg | ORAL_TABLET | Freq: Every evening | ORAL | Status: DC | PRN

## 2013-03-22 NOTE — Telephone Encounter (Signed)
Rx were faxed after speaking with pt. I LMTCB yesterday to confirm which pharmacy she wanted Rx's sent to.

## 2013-03-22 NOTE — Telephone Encounter (Signed)
I spoke with pt after she called back to office this morning & she asked that Rx are sent to [place]. Rx's faxed

## 2013-03-22 NOTE — Telephone Encounter (Signed)
Pt states she has been calling all week to get scripts for flexeril and tramadol sent to [place].

## 2013-03-25 DIAGNOSIS — H269 Unspecified cataract: Secondary | ICD-10-CM | POA: Diagnosis not present

## 2013-03-29 ENCOUNTER — Ambulatory Visit: Payer: Self-pay | Admitting: Gastroenterology

## 2013-03-29 DIAGNOSIS — R7989 Other specified abnormal findings of blood chemistry: Secondary | ICD-10-CM | POA: Diagnosis not present

## 2013-03-29 DIAGNOSIS — Z9089 Acquired absence of other organs: Secondary | ICD-10-CM | POA: Diagnosis not present

## 2013-04-02 DIAGNOSIS — M5030 Other cervical disc degeneration, unspecified cervical region: Secondary | ICD-10-CM | POA: Diagnosis not present

## 2013-04-02 DIAGNOSIS — M5412 Radiculopathy, cervical region: Secondary | ICD-10-CM | POA: Diagnosis not present

## 2013-04-02 DIAGNOSIS — M47812 Spondylosis without myelopathy or radiculopathy, cervical region: Secondary | ICD-10-CM | POA: Diagnosis not present

## 2013-04-02 DIAGNOSIS — M4802 Spinal stenosis, cervical region: Secondary | ICD-10-CM | POA: Diagnosis not present

## 2013-04-04 DIAGNOSIS — R933 Abnormal findings on diagnostic imaging of other parts of digestive tract: Secondary | ICD-10-CM | POA: Diagnosis not present

## 2013-04-04 DIAGNOSIS — K589 Irritable bowel syndrome without diarrhea: Secondary | ICD-10-CM | POA: Diagnosis not present

## 2013-04-22 DIAGNOSIS — G43719 Chronic migraine without aura, intractable, without status migrainosus: Secondary | ICD-10-CM | POA: Diagnosis not present

## 2013-04-23 ENCOUNTER — Encounter: Payer: Self-pay | Admitting: Internal Medicine

## 2013-04-23 ENCOUNTER — Ambulatory Visit (INDEPENDENT_AMBULATORY_CARE_PROVIDER_SITE_OTHER): Payer: Medicare Other | Admitting: Internal Medicine

## 2013-04-23 ENCOUNTER — Encounter (INDEPENDENT_AMBULATORY_CARE_PROVIDER_SITE_OTHER): Payer: Self-pay

## 2013-04-23 VITALS — BP 100/60 | HR 81 | Temp 98.0°F | Ht 62.5 in | Wt 164.5 lb

## 2013-04-23 DIAGNOSIS — J479 Bronchiectasis, uncomplicated: Secondary | ICD-10-CM

## 2013-04-23 DIAGNOSIS — R772 Abnormality of alphafetoprotein: Secondary | ICD-10-CM

## 2013-04-23 DIAGNOSIS — R519 Headache, unspecified: Secondary | ICD-10-CM

## 2013-04-23 DIAGNOSIS — R51 Headache: Secondary | ICD-10-CM

## 2013-04-23 DIAGNOSIS — R799 Abnormal finding of blood chemistry, unspecified: Secondary | ICD-10-CM

## 2013-04-23 DIAGNOSIS — G8929 Other chronic pain: Secondary | ICD-10-CM

## 2013-04-23 DIAGNOSIS — M5030 Other cervical disc degeneration, unspecified cervical region: Secondary | ICD-10-CM

## 2013-04-23 DIAGNOSIS — K219 Gastro-esophageal reflux disease without esophagitis: Secondary | ICD-10-CM

## 2013-04-23 DIAGNOSIS — E7800 Pure hypercholesterolemia, unspecified: Secondary | ICD-10-CM

## 2013-04-23 DIAGNOSIS — G4730 Sleep apnea, unspecified: Secondary | ICD-10-CM | POA: Diagnosis not present

## 2013-04-23 DIAGNOSIS — M810 Age-related osteoporosis without current pathological fracture: Secondary | ICD-10-CM

## 2013-04-28 ENCOUNTER — Telehealth: Payer: Self-pay | Admitting: Internal Medicine

## 2013-04-28 ENCOUNTER — Encounter: Payer: Self-pay | Admitting: Internal Medicine

## 2013-04-28 DIAGNOSIS — R772 Abnormality of alphafetoprotein: Secondary | ICD-10-CM | POA: Insufficient documentation

## 2013-04-28 NOTE — Assessment & Plan Note (Signed)
Mild.  Sees Dr [name].  CXR recently - no acute abnormality.   Breathing stable.  Recently treated for bronchitis.  Follow.

## 2013-04-28 NOTE — Assessment & Plan Note (Signed)
CPAP.  Nasal cannula - still having issues.  Followed by Dr [name].

## 2013-04-28 NOTE — Assessment & Plan Note (Signed)
On Protonix.  Symptoms controlled currently.  Follow.  EGD 8/09 revealed non bleeding erosive gastritis with gastric mucosal abnormality.

## 2013-04-28 NOTE — Assessment & Plan Note (Signed)
Has neck, shoulder and arm pain.  C-spine MRI revealed multilevel degenerative disc disease.  Takes flexeril and tramadol prn.  Follow.  Now followed by Dr Sharlet Salina.  Did not tolerate gabapentin.  Has f/u planned with Dr Sharlet Salina next week.

## 2013-04-28 NOTE — Assessment & Plan Note (Signed)
Check bone density.  Has declined treatment in the past.  Check vitamin D level.

## 2013-04-28 NOTE — Progress Notes (Signed)
Subjective:    Patient ID: Jennifer Hodges, female    DOB: 26-Sep-1945, 68 y.o.   MRN: [id number]  HPI 68 year old female with past history of hiatal hernia, esophageal ulceration, asthma, sleep apnea and mild bronchiectasis who comes in today for a scheduled follow up.   In July 2013,  she was having intermittent nausea, vomiting, etc.  Abdominal ultrasound was performed and revealed dilated common bile duct.  They suggested a possible ERCP.  She was referred to GI.  Saw [facility].  MRCP 10/17/11 revealed mild distension of biliary system most likely from prior cholecystectomy.  No obstructing abnormality.  She did have an indeterminate left renal lesion.  Renal ultrasound recommended.  CEA normal.  AFP slightly elevated - 11. Had a normal renal ultrasound 10/13.  Colonoscopy 01/10/12 revealed diverticulosis in the sigmoid colon and in the descending colon - otherwise normal.  Recommended repeat colonoscopy 10 years.  Bowels are doing better.  No significant abdominal pain currently.  She just saw GI.  They are following her AFP.  Last check stable.  Following her every six months.   Eating and drinking ok.   She also reports pain in her arms.  Saw Dr Corrin Parker.  Wearing gloves.  Recommended no surgery.  Gloves helping.  Still seeing Dr Domingo Cocking for her headaches.  On topamax.  Helps with the headaches.  Is concerned regarding some of the side effects of the Topamax.  He just decreased her dose to two per day.  She just had a stress test and f/u with Dr Ubaldo Glassing.  Heart checked out fine.  Recently evaluated at [facility] and diagnosed with bronchitis.  States CXR ok.  Better now.  Also seeing ortho for her right knee.  States was told had gout and arthritis.  Using biofreeze and some cream topically.  Helps some.  Seeing Dr Sharlet Salina for her neck and back pain.  Did not tolerate the gabapentin.  Planning to follow up with him next week. He is now prescribing her pain meds.   He recommended physical therapy.  She has  started back riding her bike.     Past Medical History  Diagnosis Date  . Hypercholesterolemia   . Bronchiectasis     mild  . Asthma   . GERD (gastroesophageal reflux disease)     EGD 8/09- non bleeding erosive gastritis, documentd esophageal ulcerations.   . Diverticulosis   . Hiatal hernia   . Sleep apnea     cpap asked to bring mask and tubing  . Chronic headaches      followed by [facility]  . Osteoarthritis     lumbar disc disease, left hip    Current Outpatient Prescriptions on File Prior to Visit  Medication Sig Dispense Refill  . ASTRAGALUS PO Take 400 mg by mouth daily.      . Biotin 1 MG CAPS Take 1 mg by mouth daily.      . Boric Acid CRYS 3 mg by Does not apply route.      . Calcium Citrate (CITRACAL PO) Take 1 tablet by mouth daily.      . Cholecalciferol (VITAMIN D PO) Take 800 Units by mouth.      . Coenzyme Q10 (CO Q 10) 100 MG CAPS Take 100 mg by mouth daily.      . cyclobenzaprine (FLEXERIL) 10 MG tablet Take 1 tablet (10 mg total) by mouth at bedtime as needed for muscle spasms.  30 tablet  0  .  ELDERBERRY PO Take 600 mg by mouth daily.      . fish oil-omega-3 fatty acids 1000 MG capsule Take 1 g by mouth daily.      . Garlic 6063 MG CAPS Take 1,000 mg by mouth daily.      . Glucos-Chondroit-Hyaluron-MSM (GLUCOSAMINE CHONDROITIN JOINT PO) Take by mouth.      . hyoscyamine (LEVSIN, ANASPAZ) 0.125 MG tablet Take 0.125 mg by mouth every 4 (four) hours as needed for cramping.      Marland Kitchen Lysine HCl (L-FORMULA LYSINE HCL) 500 MG TABS Take 500 mg by mouth daily.      . magnesium oxide (MAG-OX) 400 MG tablet Take 750 mg by mouth daily.       Marland Kitchen MELATONIN PO Take by mouth.      . Multiple Vitamins-Minerals (ONE-A-DAY WOMENS 50 PLUS PO) Take by mouth.      Jonna Coup Leaf Extract 500 MG CAPS Take 1 capsule by mouth daily.      . pantoprazole (PROTONIX) 40 MG tablet Take 40 mg by mouth daily.      . promethazine (PHENERGAN) 25 MG tablet Take 25 mg by mouth every  6 (six) hours as needed for nausea.      Marland Kitchen pyridOXINE (VITAMIN B-6) 100 MG tablet Take 100 mg by mouth daily.      . Red Yeast Rice 600 MG CAPS Take by mouth daily.      Marland Kitchen topiramate (TOPAMAX) 25 MG tablet Take 75 mg by mouth at bedtime.      . traMADol (ULTRAM) 50 MG tablet Take 1 tablet (50 mg total) by mouth every 6 (six) hours as needed.  60 tablet  0  . vitamin B-12 (CYANOCOBALAMIN) 1000 MCG tablet Take 1,000 mcg by mouth daily.      . vitamin E (VITAMIN E) 400 UNIT capsule Take 400 Units by mouth daily.      . Vitamin Mixture (ESTER-C PO) Take 1,000 mg by mouth 3 (three) times daily.       No current facility-administered medications on file prior to visit.    Review of Systems Patient denies any lightheadedness or dizziness.  Being followed by Dr Domingo Cocking for her headaches. No sinus or allergy symptoms.  No chest pain, tightness or palpitations.  No increased shortness of breath, cough or congestion.  No nausea or vomiting currently.  No acid reflux.  No significant abdominal pain or cramping currently.  Bowels vary.  No urine change.   Arm pain as outlined.  On topamax for headaches.  Cough and congestion better.  Seeing ortho for her right knee.  Being followed by Dr Sharlet Salina for her neck and back.       Objective:   Physical Exam  Filed Vitals:   04/23/13 1106  BP: 100/60  Pulse: 81  Temp: 98 F (62.77 C)   68 year old female in no acute distress.   HEENT:  Nares- clear.  Oropharynx - without lesions. NECK:  Supple.  Nontender.  No audible bruit.  HEART:  Appears to be regular. LUNGS:  No crackles or wheezing audible.  Respirations even and unlabored.  RADIAL PULSE:  Equal bilaterally. ABDOMEN:  Soft, nontender.  Bowel sounds present and normal.  No audible abdominal bruit.   EXTREMITIES:  No increased edema present.  DP pulses palpable and equal bilaterally.           Assessment & Plan:  ELEVATED AFP.  Nonspecific finding.  CEA normal.  Had abdominal ultrasound, MRCP  and  colonoscopy as outlined.  Discussed with Dr Oliva Bustard.  CT abdomen revealed persistent biliary duct distension.  CA 19-9 normal.  Following with GI.  They are following ultrasound and AFP.  States AFP just checked and stable.  Obtain records.      CARDIOVASCULAR.  Recent stress test per pt report with no evidence of ischemia.  ECHO 08/04/11 revealed EF 60%, mild RAE and mild TR.  Sees Dr Ubaldo Glassing.  Currently asymptomatic.   GU/GYN.  She is s/p surgery for vaginal prolapse.  Also had a bladder suspension.  Sees Dr [name] and GYN.  Follow.    HEALTH MAINTENANCE.  Physical 11/07/12.  GI as outlined.  S/P hysterectomy.  Mammogram 10/23/12 - Birads II.   I spent more than 40 minutes with the patient and more than 50% of the time was spent in consultation regarding the above.

## 2013-04-28 NOTE — Assessment & Plan Note (Signed)
Being followed by GI.  Just checked.  Stable.  Following every six months.

## 2013-04-28 NOTE — Telephone Encounter (Signed)
Need the last GI note and labs from [place].  Saw [facility].  States was just evaluated within the last few weeks.   Thanks.

## 2013-04-28 NOTE — Assessment & Plan Note (Signed)
Followed at the headache clinic.  On topamax.  Concern over possible side effects of topamax.  Dr Domingo Cocking just decreased the dose of topamax.  Follow.

## 2013-04-28 NOTE — Assessment & Plan Note (Signed)
Low cholesterol diet and exercise.  Check lipid panel with next fasting labs.

## 2013-04-29 NOTE — Telephone Encounter (Signed)
RECORDS REQUESTED

## 2013-05-01 NOTE — Telephone Encounter (Signed)
Reviewed.  Up to date with AFP check and ultrasound.  Saw GI 1/15.  They are planning for repeat in 6 months.

## 2013-05-01 NOTE — Telephone Encounter (Signed)
In your folder 

## 2013-05-02 DIAGNOSIS — N951 Menopausal and female climacteric states: Secondary | ICD-10-CM | POA: Diagnosis not present

## 2013-05-05 DIAGNOSIS — J069 Acute upper respiratory infection, unspecified: Secondary | ICD-10-CM | POA: Diagnosis not present

## 2013-05-13 DIAGNOSIS — H2510 Age-related nuclear cataract, unspecified eye: Secondary | ICD-10-CM | POA: Diagnosis not present

## 2013-05-20 ENCOUNTER — Encounter: Payer: Self-pay | Admitting: *Deleted

## 2013-05-20 DIAGNOSIS — M4802 Spinal stenosis, cervical region: Secondary | ICD-10-CM | POA: Diagnosis not present

## 2013-05-20 DIAGNOSIS — M47812 Spondylosis without myelopathy or radiculopathy, cervical region: Secondary | ICD-10-CM | POA: Diagnosis not present

## 2013-05-20 DIAGNOSIS — M5412 Radiculopathy, cervical region: Secondary | ICD-10-CM | POA: Diagnosis not present

## 2013-05-21 DIAGNOSIS — H02409 Unspecified ptosis of unspecified eyelid: Secondary | ICD-10-CM | POA: Diagnosis not present

## 2013-05-24 DIAGNOSIS — J479 Bronchiectasis, uncomplicated: Secondary | ICD-10-CM | POA: Diagnosis not present

## 2013-05-24 DIAGNOSIS — G4733 Obstructive sleep apnea (adult) (pediatric): Secondary | ICD-10-CM | POA: Diagnosis not present

## 2013-05-28 DIAGNOSIS — E559 Vitamin D deficiency, unspecified: Secondary | ICD-10-CM | POA: Insufficient documentation

## 2013-05-28 DIAGNOSIS — I10 Essential (primary) hypertension: Secondary | ICD-10-CM | POA: Insufficient documentation

## 2013-05-28 DIAGNOSIS — IMO0002 Reserved for concepts with insufficient information to code with codable children: Secondary | ICD-10-CM | POA: Insufficient documentation

## 2013-07-03 DIAGNOSIS — M5137 Other intervertebral disc degeneration, lumbosacral region: Secondary | ICD-10-CM | POA: Diagnosis not present

## 2013-07-03 DIAGNOSIS — M47817 Spondylosis without myelopathy or radiculopathy, lumbosacral region: Secondary | ICD-10-CM | POA: Diagnosis not present

## 2013-07-03 DIAGNOSIS — IMO0002 Reserved for concepts with insufficient information to code with codable children: Secondary | ICD-10-CM | POA: Diagnosis not present

## 2013-07-25 ENCOUNTER — Encounter: Payer: Self-pay | Admitting: Internal Medicine

## 2013-07-25 ENCOUNTER — Ambulatory Visit (INDEPENDENT_AMBULATORY_CARE_PROVIDER_SITE_OTHER): Payer: Medicare Other | Admitting: Internal Medicine

## 2013-07-25 ENCOUNTER — Encounter: Payer: Self-pay | Admitting: *Deleted

## 2013-07-25 VITALS — BP 110/60 | HR 82 | Temp 98.5°F | Ht 62.5 in | Wt 170.2 lb

## 2013-07-25 DIAGNOSIS — E7800 Pure hypercholesterolemia, unspecified: Secondary | ICD-10-CM

## 2013-07-25 DIAGNOSIS — G8929 Other chronic pain: Secondary | ICD-10-CM

## 2013-07-25 DIAGNOSIS — R002 Palpitations: Secondary | ICD-10-CM | POA: Insufficient documentation

## 2013-07-25 DIAGNOSIS — R5383 Other fatigue: Secondary | ICD-10-CM | POA: Diagnosis not present

## 2013-07-25 DIAGNOSIS — M5030 Other cervical disc degeneration, unspecified cervical region: Secondary | ICD-10-CM

## 2013-07-25 DIAGNOSIS — R0781 Pleurodynia: Secondary | ICD-10-CM

## 2013-07-25 DIAGNOSIS — J479 Bronchiectasis, uncomplicated: Secondary | ICD-10-CM

## 2013-07-25 DIAGNOSIS — Z79899 Other long term (current) drug therapy: Secondary | ICD-10-CM | POA: Diagnosis not present

## 2013-07-25 DIAGNOSIS — G4730 Sleep apnea, unspecified: Secondary | ICD-10-CM

## 2013-07-25 DIAGNOSIS — R079 Chest pain, unspecified: Secondary | ICD-10-CM | POA: Diagnosis not present

## 2013-07-25 DIAGNOSIS — R5381 Other malaise: Secondary | ICD-10-CM | POA: Diagnosis not present

## 2013-07-25 DIAGNOSIS — M810 Age-related osteoporosis without current pathological fracture: Secondary | ICD-10-CM

## 2013-07-25 DIAGNOSIS — K219 Gastro-esophageal reflux disease without esophagitis: Secondary | ICD-10-CM

## 2013-07-25 DIAGNOSIS — Z13220 Encounter for screening for lipoid disorders: Secondary | ICD-10-CM

## 2013-07-25 DIAGNOSIS — R51 Headache: Secondary | ICD-10-CM

## 2013-07-25 DIAGNOSIS — R772 Abnormality of alphafetoprotein: Secondary | ICD-10-CM

## 2013-07-25 DIAGNOSIS — R799 Abnormal finding of blood chemistry, unspecified: Secondary | ICD-10-CM | POA: Diagnosis not present

## 2013-07-25 DIAGNOSIS — I10 Essential (primary) hypertension: Secondary | ICD-10-CM | POA: Diagnosis not present

## 2013-07-25 DIAGNOSIS — IMO0002 Reserved for concepts with insufficient information to code with codable children: Secondary | ICD-10-CM | POA: Diagnosis not present

## 2013-07-25 LAB — COMPREHENSIVE METABOLIC PANEL
ALT: 16 U/L (ref 0–35)
AST: 23 U/L (ref 0–37)
Albumin: 4.1 g/dL (ref 3.5–5.2)
Alkaline Phosphatase: 84 U/L (ref 39–117)
BUN: 12 mg/dL (ref 6–23)
CO2: 29 mEq/L (ref 19–32)
Calcium: 9.6 mg/dL (ref 8.4–10.5)
Chloride: 105 mEq/L (ref 96–112)
Creatinine, Ser: 0.6 mg/dL (ref 0.4–1.2)
GFR: 113.99 mL/min (ref >60.00)
Glucose, Bld: 80 mg/dL (ref 70–99)
Potassium: 4.5 mEq/L (ref 3.5–5.1)
Sodium: 140 mEq/L (ref 135–145)
Total Bilirubin: 0.7 mg/dL (ref 0.2–1.2)
Total Protein: 6.2 g/dL (ref 6.0–8.3)

## 2013-07-25 LAB — CBC WITH DIFFERENTIAL/PLATELET
Basophils Absolute: 0 10*3/uL (ref 0.0–0.1)
Basophils Relative: 0.5 % (ref 0.0–3.0)
Eosinophils Absolute: 0.1 10*3/uL (ref 0.0–0.7)
Eosinophils Relative: 3.1 % (ref 0.0–5.0)
HCT: 37.8 % (ref 36.0–46.0)
Hemoglobin: 12.9 g/dL (ref 12.0–15.0)
Lymphocytes Relative: 29.8 % (ref 12.0–46.0)
Lymphs Abs: 1.4 10*3/uL (ref 0.7–4.0)
MCHC: 34.1 g/dL (ref 30.0–36.0)
MCV: 92.4 fl (ref 78.0–100.0)
Monocytes Absolute: 0.3 10*3/uL (ref 0.1–1.0)
Monocytes Relative: 6.7 % (ref 3.0–12.0)
Neutro Abs: 2.8 10*3/uL (ref 1.4–7.7)
Neutrophils Relative %: 59.9 % (ref 43.0–77.0)
Platelets: 239 10*3/uL (ref 150.0–400.0)
RBC: 4.09 Mil/uL (ref 3.87–5.11)
RDW: 13.3 % (ref 11.5–15.5)
WBC: 4.7 10*3/uL (ref 4.0–10.5)

## 2013-07-25 LAB — LIPID PANEL
Cholesterol: 201 mg/dL — ABNORMAL HIGH (ref 0–200)
HDL: 56.6 mg/dL (ref >39.00)
LDL Cholesterol: 125 mg/dL — ABNORMAL HIGH (ref 0–99)
Total CHOL/HDL Ratio: 4
Triglycerides: 98 mg/dL (ref 0.0–149.0)
VLDL: 19.6 mg/dL (ref 0.0–40.0)

## 2013-07-25 LAB — CK: Total CK: 48 U/L (ref 7–177)

## 2013-07-25 LAB — SEDIMENTATION RATE: Sed Rate: 15 mm/hr (ref 0–22)

## 2013-07-25 LAB — C-REACTIVE PROTEIN: CRP: <0.5 mg/dL (ref 0.5–20.0)

## 2013-07-25 LAB — TSH: TSH: 2.46 u[IU]/mL (ref 0.35–4.50)

## 2013-07-25 NOTE — Progress Notes (Signed)
Pre visit review using our clinic review tool, if applicable. No additional management support is needed unless otherwise documented below in the visit note. 

## 2013-07-26 LAB — RHEUMATOID FACTOR: Rhuematoid fact SerPl-aCnc: <10 IU/mL (ref <=14)

## 2013-07-26 LAB — ANA: Anti Nuclear Antibody(ANA): NEGATIVE

## 2013-07-29 ENCOUNTER — Encounter: Payer: Self-pay | Admitting: *Deleted

## 2013-08-01 DIAGNOSIS — IMO0002 Reserved for concepts with insufficient information to code with codable children: Secondary | ICD-10-CM | POA: Diagnosis not present

## 2013-08-01 DIAGNOSIS — R339 Retention of urine, unspecified: Secondary | ICD-10-CM | POA: Diagnosis not present

## 2013-08-01 DIAGNOSIS — M5412 Radiculopathy, cervical region: Secondary | ICD-10-CM | POA: Diagnosis not present

## 2013-08-01 DIAGNOSIS — M5030 Other cervical disc degeneration, unspecified cervical region: Secondary | ICD-10-CM | POA: Diagnosis not present

## 2013-08-01 DIAGNOSIS — N993 Prolapse of vaginal vault after hysterectomy: Secondary | ICD-10-CM | POA: Diagnosis not present

## 2013-08-01 DIAGNOSIS — N3946 Mixed incontinence: Secondary | ICD-10-CM | POA: Diagnosis not present

## 2013-08-01 DIAGNOSIS — M5416 Radiculopathy, lumbar region: Secondary | ICD-10-CM | POA: Insufficient documentation

## 2013-08-01 DIAGNOSIS — M47817 Spondylosis without myelopathy or radiculopathy, lumbosacral region: Secondary | ICD-10-CM | POA: Diagnosis not present

## 2013-08-04 ENCOUNTER — Encounter: Payer: Self-pay | Admitting: Internal Medicine

## 2013-08-04 DIAGNOSIS — R0781 Pleurodynia: Secondary | ICD-10-CM | POA: Insufficient documentation

## 2013-08-04 NOTE — Assessment & Plan Note (Signed)
CPAP.  Nasal cannula - still having issues.  Followed by Dr [name].   Offered autotitration and further evaluation.  She declines.

## 2013-08-04 NOTE — Assessment & Plan Note (Signed)
Mild.  Sees Dr [name].  CXR recently - no acute abnormality.   Breathing stable.  Follow.

## 2013-08-04 NOTE — Assessment & Plan Note (Signed)
Being followed by GI.  Last checked - stable.  Following every six months.

## 2013-08-04 NOTE — Assessment & Plan Note (Signed)
On Protonix.  Symptoms controlled currently.  Follow.  EGD 8/09 revealed non bleeding erosive gastritis with gastric mucosal abnormality.

## 2013-08-04 NOTE — Assessment & Plan Note (Signed)
Has neck, shoulder and arm pain.  C-spine MRI revealed multilevel degenerative disc disease.  Takes flexeril and tramadol prn.  Follow.  Now followed by Dr Sharlet Salina.  Did not tolerate gabapentin.  Has f/u planned with Dr Sharlet Salina next week.

## 2013-08-04 NOTE — Assessment & Plan Note (Signed)
Followed at the headache clinic.  On topamax.   Follow.

## 2013-08-04 NOTE — Assessment & Plan Note (Addendum)
Bone density 05/02/13 - osteopenia.  Vitamin D level checked 8/14 - wnl.

## 2013-08-04 NOTE — Progress Notes (Signed)
Subjective:    Patient ID: Jennifer Hodges, female    DOB: 1945-04-02, 68 y.o.   MRN: [id number]  HPI 68 year old female with past history of hiatal hernia, esophageal ulceration, asthma, sleep apnea and mild bronchiectasis who comes in today for a scheduled follow up.   In July 2013,  she was having intermittent nausea, vomiting, etc.  Abdominal ultrasound was performed and revealed dilated common bile duct.  They suggested a possible ERCP.  She was referred to GI.  Saw [facility].  MRCP 10/17/11 revealed mild distension of biliary system most likely from prior cholecystectomy.  No obstructing abnormality.  She did have an indeterminate left renal lesion.  Renal ultrasound recommended.  CEA normal.  AFP slightly elevated - 11. Had a normal renal ultrasound 10/13.  Colonoscopy 01/10/12 revealed diverticulosis in the sigmoid colon and in the descending colon - otherwise normal.  Recommended repeat colonoscopy 10 years.  Bowels are doing better.  No significant abdominal pain currently.  Seeing GI.  They are following her AFP.  Last check stable.  Following her every six months.   Eating and drinking ok.   Still seeing Dr Domingo Cocking for her headaches.  On topamax.  Helps with the headaches.  She just had a stress test and f/u with Dr Ubaldo Glassing.  Heart checked out fine.   Seeing Dr Sharlet Salina for her neck and back pain.  Did not tolerate the gabapentin.  Is s/p injection.  Has f/u next week.  Has sleep apnea.  Using CPAP.  Discussed autotitration.  She declines.       Past Medical History  Diagnosis Date  . Hypercholesterolemia   . Bronchiectasis     mild  . Asthma   . GERD (gastroesophageal reflux disease)     EGD 8/09- non bleeding erosive gastritis, documentd esophageal ulcerations.   . Diverticulosis   . Hiatal hernia   . Sleep apnea     cpap asked to bring mask and tubing  . Chronic headaches      followed by [facility]  . Osteoarthritis     lumbar disc disease, left hip    Current  Outpatient Prescriptions on File Prior to Visit  Medication Sig Dispense Refill  . Cholecalciferol (VITAMIN D PO) Take 800 Units by mouth.      . Coenzyme Q10 (CO Q 10) 100 MG CAPS Take 100 mg by mouth daily.      . cyclobenzaprine (FLEXERIL) 10 MG tablet Take 1 tablet (10 mg total) by mouth at bedtime as needed for muscle spasms.  30 tablet  0  . fish oil-omega-3 fatty acids 1000 MG capsule Take 1 g by mouth daily.      . Garlic 9470 MG CAPS Take 1,000 mg by mouth daily.      . Glucos-Chondroit-Hyaluron-MSM (GLUCOSAMINE CHONDROITIN JOINT PO) Take by mouth.      . hyoscyamine (LEVSIN, ANASPAZ) 0.125 MG tablet Take 0.125 mg by mouth every 4 (four) hours as needed for cramping.      . Multiple Vitamins-Minerals (ONE-A-DAY WOMENS 50 PLUS PO) Take by mouth.      . pantoprazole (PROTONIX) 40 MG tablet Take 40 mg by mouth daily.      . promethazine (PHENERGAN) 25 MG tablet Take 25 mg by mouth every 6 (six) hours as needed for nausea.      Marland Kitchen pyridOXINE (VITAMIN B-6) 100 MG tablet Take 100 mg by mouth daily.      . Red Yeast Rice 600 MG  CAPS Take by mouth daily.      Marland Kitchen topiramate (TOPAMAX) 25 MG tablet Take 75 mg by mouth at bedtime.      . traMADol (ULTRAM) 50 MG tablet Take 1 tablet (50 mg total) by mouth every 6 (six) hours as needed.  60 tablet  0  . vitamin B-12 (CYANOCOBALAMIN) 1000 MCG tablet Take 1,000 mcg by mouth daily.      . vitamin E (VITAMIN E) 400 UNIT capsule Take 400 Units by mouth daily.      . Vitamin Mixture (ESTER-C PO) Take 1,000 mg by mouth 3 (three) times daily.       No current facility-administered medications on file prior to visit.    Review of Systems Patient denies any lightheadedness or dizziness.  Being followed by Dr Domingo Cocking for her headaches. No sinus or allergy symptoms.  No chest pain, tightness or palpitations.  No increased shortness of breath, cough or congestion.  No nausea or vomiting currently.  No acid reflux.  No significant abdominal pain or cramping  currently.  Bowels vary.  No urine change.  On topamax for headaches.  Being followed by Dr Sharlet Salina for her neck and back.  Using CPAP.       Objective:   Physical Exam  Filed Vitals:   07/25/13 1011  BP: 110/60  Pulse: 82  Temp: 98.5 F (36.9 C)   Blood pressure recheck:  64/33  68 year old female in no acute distress.   HEENT:  Nares- clear.  Oropharynx - without lesions. NECK:  Supple.  Nontender.  No audible bruit.  HEART:  Appears to be regular. LUNGS:  No crackles or wheezing audible.  Respirations even and unlabored.  RADIAL PULSE:  Equal bilaterally. ABDOMEN:  Soft, nontender.  Bowel sounds present and normal.  No audible abdominal bruit.   EXTREMITIES:  No increased edema present.  DP pulses palpable and equal bilaterally.           Assessment & Plan:  ELEVATED AFP.  Nonspecific finding.  CEA normal.  Had abdominal ultrasound, MRCP and colonoscopy as outlined.  Discussed with Dr Oliva Bustard.  CT abdomen revealed persistent biliary duct distension.  CA 19-9 normal.  Following with GI.  They are following ultrasound and AFP.  States AFP last checked and stable.      CARDIOVASCULAR.  Recent stress test per pt report with no evidence of ischemia.  ECHO 08/04/11 revealed EF 60%, mild RAE and mild TR.  Sees Dr Ubaldo Glassing.  Currently asymptomatic.   GU/GYN.  She is s/p surgery for vaginal prolapse.  Also had a bladder suspension.  Sees Dr [name] and GYN.  Follow.    HEALTH MAINTENANCE.  Physical 11/07/12.  GI as outlined.  S/P hysterectomy.  Mammogram 10/23/12 - Birads II.   I spent more than 25 minutes with the patient and more than 50% of the time was spent in consultation regarding the above.

## 2013-08-04 NOTE — Assessment & Plan Note (Signed)
Low cholesterol diet and exercise.  Check lipid panel with next fasting labs.

## 2013-08-04 NOTE — Assessment & Plan Note (Signed)
Described persistent rib pain.  Check esr and rheumatoid panel.

## 2013-08-15 DIAGNOSIS — M5412 Radiculopathy, cervical region: Secondary | ICD-10-CM | POA: Diagnosis not present

## 2013-08-15 DIAGNOSIS — M5030 Other cervical disc degeneration, unspecified cervical region: Secondary | ICD-10-CM | POA: Diagnosis not present

## 2013-08-20 ENCOUNTER — Encounter: Payer: Self-pay | Admitting: Internal Medicine

## 2013-09-03 DIAGNOSIS — K219 Gastro-esophageal reflux disease without esophagitis: Secondary | ICD-10-CM | POA: Diagnosis not present

## 2013-09-03 DIAGNOSIS — R799 Abnormal finding of blood chemistry, unspecified: Secondary | ICD-10-CM | POA: Diagnosis not present

## 2013-09-03 DIAGNOSIS — K589 Irritable bowel syndrome without diarrhea: Secondary | ICD-10-CM | POA: Diagnosis not present

## 2013-09-03 DIAGNOSIS — K838 Other specified diseases of biliary tract: Secondary | ICD-10-CM | POA: Diagnosis not present

## 2013-09-05 ENCOUNTER — Ambulatory Visit: Payer: Self-pay | Admitting: Gastroenterology

## 2013-09-05 ENCOUNTER — Telehealth: Payer: Self-pay | Admitting: Internal Medicine

## 2013-09-05 DIAGNOSIS — R935 Abnormal findings on diagnostic imaging of other abdominal regions, including retroperitoneum: Secondary | ICD-10-CM | POA: Diagnosis not present

## 2013-09-05 DIAGNOSIS — R799 Abnormal finding of blood chemistry, unspecified: Secondary | ICD-10-CM | POA: Diagnosis not present

## 2013-09-05 NOTE — Telephone Encounter (Signed)
Pt notified that we have not received a copy of labs & she needs to contact Dr. Gustavo Lah.

## 2013-09-05 NOTE — Telephone Encounter (Signed)
The patient had labs done at Dr. Nicki Reaper office and they were to fax the results over to Dr. Nicki Reaper .She stated that they were cancer markers and she is wanting Dr. Nicki Reaper to give her the results.

## 2013-09-19 DIAGNOSIS — H52229 Regular astigmatism, unspecified eye: Secondary | ICD-10-CM | POA: Diagnosis not present

## 2013-09-19 DIAGNOSIS — M5030 Other cervical disc degeneration, unspecified cervical region: Secondary | ICD-10-CM | POA: Diagnosis not present

## 2013-09-19 DIAGNOSIS — IMO0002 Reserved for concepts with insufficient information to code with codable children: Secondary | ICD-10-CM | POA: Diagnosis not present

## 2013-09-19 DIAGNOSIS — M1610 Unilateral primary osteoarthritis, unspecified hip: Secondary | ICD-10-CM | POA: Diagnosis not present

## 2013-09-19 DIAGNOSIS — M169 Osteoarthritis of hip, unspecified: Secondary | ICD-10-CM | POA: Diagnosis not present

## 2013-09-19 DIAGNOSIS — H2510 Age-related nuclear cataract, unspecified eye: Secondary | ICD-10-CM | POA: Diagnosis not present

## 2013-09-19 DIAGNOSIS — H524 Presbyopia: Secondary | ICD-10-CM | POA: Diagnosis not present

## 2013-09-19 DIAGNOSIS — M5412 Radiculopathy, cervical region: Secondary | ICD-10-CM | POA: Diagnosis not present

## 2013-09-19 DIAGNOSIS — H5200 Hypermetropia, unspecified eye: Secondary | ICD-10-CM | POA: Diagnosis not present

## 2013-09-19 DIAGNOSIS — M47817 Spondylosis without myelopathy or radiculopathy, lumbosacral region: Secondary | ICD-10-CM | POA: Diagnosis not present

## 2013-10-01 ENCOUNTER — Ambulatory Visit: Payer: Self-pay | Admitting: Physical Medicine and Rehabilitation

## 2013-10-01 DIAGNOSIS — M5137 Other intervertebral disc degeneration, lumbosacral region: Secondary | ICD-10-CM | POA: Diagnosis not present

## 2013-10-07 ENCOUNTER — Other Ambulatory Visit: Payer: Self-pay | Admitting: *Deleted

## 2013-10-07 ENCOUNTER — Telehealth: Payer: Self-pay | Admitting: *Deleted

## 2013-10-07 DIAGNOSIS — R51 Headache: Secondary | ICD-10-CM

## 2013-10-07 MED ORDER — PANTOPRAZOLE SODIUM 40 MG PO TBEC: 40.0000 mg | DELAYED_RELEASE_TABLET | Freq: Every day | ORAL | Status: DC

## 2013-10-07 NOTE — Telephone Encounter (Signed)
Pt.notified

## 2013-10-07 NOTE — Telephone Encounter (Signed)
Pt called requesting that Dr. Nicki Reaper would call and discontinue migraine study with Dr. Orie Rout in [place] & refer her to someone locally. Pt reports that her symptoms are not changing much, seems to be increasing recently.

## 2013-10-07 NOTE — Telephone Encounter (Signed)
I have placed the order for the referral to a local neurologist, but she will need to call and take herself out of the study.

## 2013-10-09 DIAGNOSIS — M1610 Unilateral primary osteoarthritis, unspecified hip: Secondary | ICD-10-CM | POA: Diagnosis not present

## 2013-10-09 DIAGNOSIS — M169 Osteoarthritis of hip, unspecified: Secondary | ICD-10-CM | POA: Diagnosis not present

## 2013-10-18 DIAGNOSIS — M5137 Other intervertebral disc degeneration, lumbosacral region: Secondary | ICD-10-CM | POA: Diagnosis not present

## 2013-10-18 DIAGNOSIS — M1610 Unilateral primary osteoarthritis, unspecified hip: Secondary | ICD-10-CM | POA: Diagnosis not present

## 2013-10-18 DIAGNOSIS — IMO0002 Reserved for concepts with insufficient information to code with codable children: Secondary | ICD-10-CM | POA: Diagnosis not present

## 2013-10-18 DIAGNOSIS — M5412 Radiculopathy, cervical region: Secondary | ICD-10-CM | POA: Diagnosis not present

## 2013-10-21 DIAGNOSIS — H2510 Age-related nuclear cataract, unspecified eye: Secondary | ICD-10-CM | POA: Diagnosis not present

## 2013-10-23 DIAGNOSIS — IMO0002 Reserved for concepts with insufficient information to code with codable children: Secondary | ICD-10-CM | POA: Diagnosis not present

## 2013-10-23 DIAGNOSIS — M5137 Other intervertebral disc degeneration, lumbosacral region: Secondary | ICD-10-CM | POA: Diagnosis not present

## 2013-10-29 ENCOUNTER — Ambulatory Visit: Payer: Self-pay | Admitting: Internal Medicine

## 2013-10-29 DIAGNOSIS — Z1231 Encounter for screening mammogram for malignant neoplasm of breast: Secondary | ICD-10-CM | POA: Diagnosis not present

## 2013-10-29 LAB — HM MAMMOGRAPHY: HM Mammogram: NEGATIVE

## 2013-10-30 DIAGNOSIS — G44229 Chronic tension-type headache, not intractable: Secondary | ICD-10-CM | POA: Diagnosis not present

## 2013-10-30 DIAGNOSIS — M5481 Occipital neuralgia: Secondary | ICD-10-CM | POA: Insufficient documentation

## 2013-10-30 DIAGNOSIS — G44221 Chronic tension-type headache, intractable: Secondary | ICD-10-CM | POA: Insufficient documentation

## 2013-10-30 DIAGNOSIS — G43019 Migraine without aura, intractable, without status migrainosus: Secondary | ICD-10-CM | POA: Diagnosis not present

## 2013-10-30 DIAGNOSIS — M531 Cervicobrachial syndrome: Secondary | ICD-10-CM | POA: Diagnosis not present

## 2013-10-31 ENCOUNTER — Encounter: Payer: Self-pay | Admitting: Internal Medicine

## 2013-11-03 DIAGNOSIS — J3489 Other specified disorders of nose and nasal sinuses: Secondary | ICD-10-CM | POA: Diagnosis not present

## 2013-11-05 DIAGNOSIS — J479 Bronchiectasis, uncomplicated: Secondary | ICD-10-CM | POA: Diagnosis not present

## 2013-11-05 DIAGNOSIS — G4733 Obstructive sleep apnea (adult) (pediatric): Secondary | ICD-10-CM | POA: Diagnosis not present

## 2013-11-05 DIAGNOSIS — J310 Chronic rhinitis: Secondary | ICD-10-CM | POA: Diagnosis not present

## 2013-11-05 DIAGNOSIS — R059 Cough, unspecified: Secondary | ICD-10-CM | POA: Diagnosis not present

## 2013-11-05 DIAGNOSIS — R05 Cough: Secondary | ICD-10-CM | POA: Diagnosis not present

## 2013-11-07 DIAGNOSIS — J301 Allergic rhinitis due to pollen: Secondary | ICD-10-CM | POA: Diagnosis not present

## 2013-11-07 DIAGNOSIS — J342 Deviated nasal septum: Secondary | ICD-10-CM | POA: Diagnosis not present

## 2013-11-07 DIAGNOSIS — L0201 Cutaneous abscess of face: Secondary | ICD-10-CM | POA: Diagnosis not present

## 2013-11-07 DIAGNOSIS — L03211 Cellulitis of face: Secondary | ICD-10-CM | POA: Diagnosis not present

## 2013-11-11 ENCOUNTER — Ambulatory Visit (INDEPENDENT_AMBULATORY_CARE_PROVIDER_SITE_OTHER): Payer: Medicare Other | Admitting: Internal Medicine

## 2013-11-11 ENCOUNTER — Encounter: Payer: Self-pay | Admitting: Internal Medicine

## 2013-11-11 VITALS — BP 110/70 | HR 77 | Temp 98.1°F | Ht 62.0 in | Wt 167.0 lb

## 2013-11-11 DIAGNOSIS — J479 Bronchiectasis, uncomplicated: Secondary | ICD-10-CM | POA: Diagnosis not present

## 2013-11-11 DIAGNOSIS — E7800 Pure hypercholesterolemia, unspecified: Secondary | ICD-10-CM

## 2013-11-11 DIAGNOSIS — M810 Age-related osteoporosis without current pathological fracture: Secondary | ICD-10-CM

## 2013-11-11 DIAGNOSIS — R799 Abnormal finding of blood chemistry, unspecified: Secondary | ICD-10-CM

## 2013-11-11 DIAGNOSIS — E162 Hypoglycemia, unspecified: Secondary | ICD-10-CM | POA: Diagnosis not present

## 2013-11-11 DIAGNOSIS — R51 Headache: Secondary | ICD-10-CM | POA: Diagnosis not present

## 2013-11-11 DIAGNOSIS — J3489 Other specified disorders of nose and nasal sinuses: Secondary | ICD-10-CM

## 2013-11-11 DIAGNOSIS — K219 Gastro-esophageal reflux disease without esophagitis: Secondary | ICD-10-CM

## 2013-11-11 DIAGNOSIS — R519 Headache, unspecified: Secondary | ICD-10-CM

## 2013-11-11 DIAGNOSIS — Z01810 Encounter for preprocedural cardiovascular examination: Secondary | ICD-10-CM | POA: Diagnosis not present

## 2013-11-11 DIAGNOSIS — G8929 Other chronic pain: Secondary | ICD-10-CM

## 2013-11-11 DIAGNOSIS — R772 Abnormality of alphafetoprotein: Secondary | ICD-10-CM

## 2013-11-11 DIAGNOSIS — M5030 Other cervical disc degeneration, unspecified cervical region: Secondary | ICD-10-CM

## 2013-11-11 DIAGNOSIS — G4730 Sleep apnea, unspecified: Secondary | ICD-10-CM

## 2013-11-11 LAB — LIPID PANEL
Cholesterol: 216 mg/dL — ABNORMAL HIGH (ref 0–200)
HDL: 51.9 mg/dL (ref >39.00)
LDL Cholesterol: 141 mg/dL — ABNORMAL HIGH (ref 0–99)
NonHDL: 164.1
Total CHOL/HDL Ratio: 4
Triglycerides: 114 mg/dL (ref 0.0–149.0)
VLDL: 22.8 mg/dL (ref 0.0–40.0)

## 2013-11-11 LAB — COMPREHENSIVE METABOLIC PANEL
ALT: 22 U/L (ref 0–35)
AST: 25 U/L (ref 0–37)
Albumin: 4 g/dL (ref 3.5–5.2)
Alkaline Phosphatase: 82 U/L (ref 39–117)
BUN: 14 mg/dL (ref 6–23)
CO2: 27 mEq/L (ref 19–32)
Calcium: 9.5 mg/dL (ref 8.4–10.5)
Chloride: 107 mEq/L (ref 96–112)
Creatinine, Ser: 0.8 mg/dL (ref 0.4–1.2)
GFR: 74.39 mL/min (ref >60.00)
Glucose, Bld: 83 mg/dL (ref 70–99)
Potassium: 4.6 mEq/L (ref 3.5–5.1)
Sodium: 141 mEq/L (ref 135–145)
Total Bilirubin: 0.6 mg/dL (ref 0.2–1.2)
Total Protein: 6.9 g/dL (ref 6.0–8.3)

## 2013-11-11 LAB — HEMOGLOBIN A1C: Hgb A1c MFr Bld: 5.7 % (ref 4.6–6.5)

## 2013-11-11 LAB — VITAMIN D 25 HYDROXY (VIT D DEFICIENCY, FRACTURES): VITD: 31.39 ng/mL (ref 30.00–100.00)

## 2013-11-11 NOTE — Progress Notes (Signed)
Pre visit review using our clinic review tool, if applicable. No additional management support is needed unless otherwise documented below in the visit note. 

## 2013-11-12 ENCOUNTER — Encounter: Payer: Self-pay | Admitting: *Deleted

## 2013-11-12 ENCOUNTER — Encounter: Payer: Self-pay | Admitting: Internal Medicine

## 2013-11-12 DIAGNOSIS — J3489 Other specified disorders of nose and nasal sinuses: Secondary | ICD-10-CM | POA: Insufficient documentation

## 2013-11-12 NOTE — Assessment & Plan Note (Signed)
Using CPAP.  Had suggested using humidifier.

## 2013-11-12 NOTE — Assessment & Plan Note (Signed)
On Protonix.  Symptoms controlled currently.  Follow.  EGD 8/09 revealed non bleeding erosive gastritis with gastric mucosal abnormality.

## 2013-11-12 NOTE — Assessment & Plan Note (Signed)
Low cholesterol diet and exercise.  Check lipid panel with next fasting labs.

## 2013-11-12 NOTE — Progress Notes (Addendum)
Subjective:    Patient ID: Jennifer Hodges, female    DOB: [birth year], 68 y.o.   MRN: [id number]  HPI 68 year old female with past history of hiatal hernia, esophageal ulceration, asthma, sleep apnea and mild bronchiectasis who comes in today to follow up on these issues as well as for a complete physical exam.   In July 2013,  she was having intermittent nausea, vomiting, etc.  Abdominal ultrasound was performed and revealed dilated common bile duct.  They suggested a possible ERCP.  She was referred to GI.  Saw [facility].  MRCP 10/17/11 revealed mild distension of biliary system most likely from prior cholecystectomy.  No obstructing abnormality.  She did have an indeterminate left renal lesion.  Renal ultrasound recommended.  CEA normal.  AFP slightly elevated - 11. Had a normal renal ultrasound 10/13.  Colonoscopy 01/10/12 revealed diverticulosis in the sigmoid colon and in the descending colon - otherwise normal. Recommended repeat colonoscopy 10 years.  Reports bowels are doing better.  No abdominal pain currently.  Seeing GI.  They are following her AFP.  Last check stable.  Have been following her every six months.  Per last note, since stable and [place] unrevealing (EUS negative), will repeat a f/u AFP and ultrasound. If stable, then check yearly.   Eating and drinking well  Now seeing Dr Manuella Ghazi for her headaches.  On topamax.  Helps with the headaches. Started magnesium.  She had a stress test and f/u with Dr Ubaldo Glassing.  Heart checked out fine.   Seeing Dr Sharlet Salina for her neck and back pain.  Did not tolerate the gabapentin.  Is s/p injection of her hip.  Hip is much better.   Has sleep apnea.  Using CPAP.  She has recently seen Dr Pryor Ochoa for persistent nasal lesion.  On Bactrim now.  Using bactroban.  Improved.  Planning for cataract surgery - right 12/24/13 and left 01/07/14.  Dr George Ina wants EKG today.       Past Medical History  Diagnosis Date  . Hypercholesterolemia   . Bronchiectasis      mild  . Asthma   . GERD (gastroesophageal reflux disease)     EGD 8/09- non bleeding erosive gastritis, documentd esophageal ulcerations.   . Diverticulosis   . Hiatal hernia   . Sleep apnea     cpap asked to bring mask and tubing  . Chronic headaches      followed by [facility]  . Osteoarthritis     lumbar disc disease, left hip    Current Outpatient Prescriptions on File Prior to Visit  Medication Sig Dispense Refill  . Cholecalciferol (VITAMIN D PO) Take 800 Units by mouth.      . Coenzyme Q10 (CO Q 10) 100 MG CAPS Take 100 mg by mouth daily.      . cyclobenzaprine (FLEXERIL) 10 MG tablet Take 1 tablet (10 mg total) by mouth at bedtime as needed for muscle spasms.  30 tablet  0  . fish oil-omega-3 fatty acids 1000 MG capsule Take 1 g by mouth daily.      . Garlic 3016 MG CAPS Take 2 mg by mouth daily.       . Glucos-Chondroit-Hyaluron-MSM (GLUCOSAMINE CHONDROITIN JOINT PO) Take by mouth.      . hyoscyamine (LEVSIN, ANASPAZ) 0.125 MG tablet Take 0.125 mg by mouth every 4 (four) hours as needed for cramping.      . Multiple Vitamins-Minerals (ONE-A-DAY WOMENS 50 PLUS PO) Take by mouth.      Marland Kitchen  pantoprazole (PROTONIX) 40 MG tablet Take 1 tablet (40 mg total) by mouth daily.  90 tablet  2  . promethazine (PHENERGAN) 25 MG tablet Take 25 mg by mouth every 6 (six) hours as needed for nausea.      Marland Kitchen pyridOXINE (VITAMIN B-6) 100 MG tablet Take 100 mg by mouth daily.      Marland Kitchen topiramate (TOPAMAX) 25 MG tablet Take 25 mg by mouth at bedtime.       . traMADol (ULTRAM) 50 MG tablet Take 1 tablet (50 mg total) by mouth every 6 (six) hours as needed.  60 tablet  0  . vitamin B-12 (CYANOCOBALAMIN) 1000 MCG tablet Take 1,000 mcg by mouth daily.      . vitamin E (VITAMIN E) 400 UNIT capsule Take 400 Units by mouth daily.      . Vitamin Mixture (ESTER-C PO) Take 1,000 mg by mouth 3 (three) times daily.       No current facility-administered medications on file prior to visit.     Review of Systems Patient denies any lightheadedness or dizziness.  Now seeing Dr Manuella Ghazi for her headaches. No sinus or allergy symptoms.  No chest pain, tightness or palpitations.  No increased shortness of breath, cough or congestion.  No nausea or vomiting currently.  No acid reflux.  No abdominal pain or cramping currently.  Bowels doing better.  No urine change.  On topamax for headaches.  Just started magnesium.  Being followed by Dr Sharlet Salina for her neck and back.  S/p hip injection.  Hip better. Using CPAP.  On abx now for persistent nasal lesion.       Objective:   Physical Exam  Filed Vitals:   11/11/13 1029  BP: 110/70  Pulse: 77  Temp: 98.1 F (41.83 C)   68 year old female in no acute distress.   HEENT:  Nares- clear.  Oropharynx - without lesions. NECK:  Supple.  Nontender.  No audible bruit.  HEART:  Appears to be regular. LUNGS:  No crackles or wheezing audible.  Respirations even and unlabored.  RADIAL PULSE:  Equal bilaterally.    BREASTS:  No nipple discharge or nipple retraction present.  Could not appreciate any distinct nodules or axillary adenopathy.  ABDOMEN:  Soft, nontender.  Bowel sounds present and normal.  No audible abdominal bruit.  GU:  Not performed.    EXTREMITIES:  No increased edema present.  DP pulses palpable and equal bilaterally.        Assessment & Plan:  ELEVATED AFP.  Nonspecific finding.  CEA normal.  Had abdominal ultrasound, MRCP and colonoscopy as outlined.  Discussed with Dr Oliva Bustard.  CT abdomen revealed persistent biliary duct distension.  CA 19-9 normal.  Following with GI.  EUS ok.  They are following ultrasound and AFP.  States AFP last checked and stable.  See above.       CARDIOVASCULAR.  Relatively recent stress test per pt report with no evidence of ischemia.  ECHO 08/04/11 revealed EF 60%, mild RAE and mild TR.  Sees Dr Ubaldo Glassing.  Currently asymptomatic.  Dr George Ina wanted EKG today.  EKG revealed SR with TWI in v2.  No acute ischemic  changes.  Pt currently with no cardiac symptoms.  Feels she is at low risk from cardiac standpoint to proceed with cataract surgery.  Will need close intra op and post op monitoring of heart rate and blood pressure to avoid extremes.    GU/GYN.  She is s/p surgery for vaginal  prolapse.  Also had a bladder suspension.  Sees Dr [name] and GYN.  Follow.    HEALTH MAINTENANCE.  Physical today.  GI as outlined.  S/P hysterectomy.  Mammogram 10/23/12 - Birads II.  Schedule f/u mammogram.    I spent more than 25 minutes with the patient and more than 50% of the time was spent in consultation regarding the above.    ADDENDUM:  Reviewed chart.  Pt had mammogram 10/29/13 - Birads I.

## 2013-11-12 NOTE — Assessment & Plan Note (Signed)
Being treated with bactrim.  Has had topical bactroban.  Seeing Dr Pryor Ochoa.  Is better.

## 2013-11-12 NOTE — Assessment & Plan Note (Signed)
Mild.  Sees Dr [name].  Last CXR - no acute abnormality.   Breathing stable.  Follow.

## 2013-11-12 NOTE — Assessment & Plan Note (Signed)
Was followed at the headache clinic.  On topamax.  Now seeing Dr Manuella Ghazi.  Recommend she start magnesium.  Follow.

## 2013-11-12 NOTE — Assessment & Plan Note (Signed)
Has neck, shoulder and arm pain.  C-spine MRI revealed multilevel degenerative disc disease.  Takes flexeril and tramadol prn.  Follow.  Now followed by Dr Sharlet Salina.  Did not tolerate gabapentin.

## 2013-11-12 NOTE — Assessment & Plan Note (Signed)
Being followed by GI.  Last checked - stable.  Has been ollowed every six months.  If remains stable, will change to yearly checks per GI.  Extensive w/up as outlined.

## 2013-11-19 DIAGNOSIS — J3489 Other specified disorders of nose and nasal sinuses: Secondary | ICD-10-CM | POA: Diagnosis not present

## 2013-11-25 DIAGNOSIS — M5137 Other intervertebral disc degeneration, lumbosacral region: Secondary | ICD-10-CM | POA: Diagnosis not present

## 2013-11-25 DIAGNOSIS — M5412 Radiculopathy, cervical region: Secondary | ICD-10-CM | POA: Diagnosis not present

## 2013-11-25 DIAGNOSIS — IMO0002 Reserved for concepts with insufficient information to code with codable children: Secondary | ICD-10-CM | POA: Diagnosis not present

## 2013-11-29 DIAGNOSIS — M5137 Other intervertebral disc degeneration, lumbosacral region: Secondary | ICD-10-CM | POA: Diagnosis not present

## 2013-11-29 DIAGNOSIS — IMO0002 Reserved for concepts with insufficient information to code with codable children: Secondary | ICD-10-CM | POA: Diagnosis not present

## 2013-12-12 DIAGNOSIS — H2513 Age-related nuclear cataract, bilateral: Secondary | ICD-10-CM | POA: Diagnosis not present

## 2013-12-24 ENCOUNTER — Ambulatory Visit: Payer: Self-pay | Admitting: Ophthalmology

## 2013-12-24 DIAGNOSIS — Z8711 Personal history of peptic ulcer disease: Secondary | ICD-10-CM | POA: Diagnosis not present

## 2013-12-24 DIAGNOSIS — E119 Type 2 diabetes mellitus without complications: Secondary | ICD-10-CM | POA: Diagnosis not present

## 2013-12-24 DIAGNOSIS — Z79899 Other long term (current) drug therapy: Secondary | ICD-10-CM | POA: Diagnosis not present

## 2013-12-24 DIAGNOSIS — Z881 Allergy status to other antibiotic agents status: Secondary | ICD-10-CM | POA: Diagnosis not present

## 2013-12-24 DIAGNOSIS — I499 Cardiac arrhythmia, unspecified: Secondary | ICD-10-CM | POA: Diagnosis not present

## 2013-12-24 DIAGNOSIS — G4730 Sleep apnea, unspecified: Secondary | ICD-10-CM | POA: Diagnosis not present

## 2013-12-24 DIAGNOSIS — K219 Gastro-esophageal reflux disease without esophagitis: Secondary | ICD-10-CM | POA: Diagnosis not present

## 2013-12-24 DIAGNOSIS — H2511 Age-related nuclear cataract, right eye: Secondary | ICD-10-CM | POA: Diagnosis not present

## 2013-12-24 DIAGNOSIS — Z886 Allergy status to analgesic agent status: Secondary | ICD-10-CM | POA: Diagnosis not present

## 2013-12-24 DIAGNOSIS — H269 Unspecified cataract: Secondary | ICD-10-CM | POA: Diagnosis not present

## 2013-12-24 DIAGNOSIS — Z888 Allergy status to other drugs, medicaments and biological substances status: Secondary | ICD-10-CM | POA: Diagnosis not present

## 2013-12-24 DIAGNOSIS — Z87891 Personal history of nicotine dependence: Secondary | ICD-10-CM | POA: Diagnosis not present

## 2013-12-24 DIAGNOSIS — M1990 Unspecified osteoarthritis, unspecified site: Secondary | ICD-10-CM | POA: Diagnosis not present

## 2013-12-30 DIAGNOSIS — M5136 Other intervertebral disc degeneration, lumbar region: Secondary | ICD-10-CM | POA: Diagnosis not present

## 2013-12-30 DIAGNOSIS — M4726 Other spondylosis with radiculopathy, lumbar region: Secondary | ICD-10-CM | POA: Diagnosis not present

## 2013-12-30 DIAGNOSIS — M5416 Radiculopathy, lumbar region: Secondary | ICD-10-CM | POA: Diagnosis not present

## 2014-01-02 DIAGNOSIS — H2512 Age-related nuclear cataract, left eye: Secondary | ICD-10-CM | POA: Diagnosis not present

## 2014-01-07 ENCOUNTER — Ambulatory Visit: Payer: Self-pay | Admitting: Ophthalmology

## 2014-01-07 DIAGNOSIS — E119 Type 2 diabetes mellitus without complications: Secondary | ICD-10-CM | POA: Diagnosis not present

## 2014-01-07 DIAGNOSIS — Z8711 Personal history of peptic ulcer disease: Secondary | ICD-10-CM | POA: Diagnosis not present

## 2014-01-07 DIAGNOSIS — Z885 Allergy status to narcotic agent status: Secondary | ICD-10-CM | POA: Diagnosis not present

## 2014-01-07 DIAGNOSIS — Z886 Allergy status to analgesic agent status: Secondary | ICD-10-CM | POA: Diagnosis not present

## 2014-01-07 DIAGNOSIS — G4730 Sleep apnea, unspecified: Secondary | ICD-10-CM | POA: Diagnosis not present

## 2014-01-07 DIAGNOSIS — H2512 Age-related nuclear cataract, left eye: Secondary | ICD-10-CM | POA: Diagnosis not present

## 2014-01-07 DIAGNOSIS — K219 Gastro-esophageal reflux disease without esophagitis: Secondary | ICD-10-CM | POA: Diagnosis not present

## 2014-01-07 DIAGNOSIS — H269 Unspecified cataract: Secondary | ICD-10-CM | POA: Diagnosis not present

## 2014-02-14 DIAGNOSIS — G5601 Carpal tunnel syndrome, right upper limb: Secondary | ICD-10-CM | POA: Diagnosis not present

## 2014-02-14 DIAGNOSIS — G5602 Carpal tunnel syndrome, left upper limb: Secondary | ICD-10-CM | POA: Diagnosis not present

## 2014-03-08 DIAGNOSIS — J0100 Acute maxillary sinusitis, unspecified: Secondary | ICD-10-CM | POA: Diagnosis not present

## 2014-03-10 DIAGNOSIS — J4520 Mild intermittent asthma, uncomplicated: Secondary | ICD-10-CM | POA: Diagnosis not present

## 2014-03-10 DIAGNOSIS — R05 Cough: Secondary | ICD-10-CM | POA: Diagnosis not present

## 2014-03-10 DIAGNOSIS — J310 Chronic rhinitis: Secondary | ICD-10-CM | POA: Diagnosis not present

## 2014-03-10 DIAGNOSIS — G4733 Obstructive sleep apnea (adult) (pediatric): Secondary | ICD-10-CM | POA: Diagnosis not present

## 2014-03-24 ENCOUNTER — Ambulatory Visit (INDEPENDENT_AMBULATORY_CARE_PROVIDER_SITE_OTHER): Payer: Medicare Other | Admitting: Internal Medicine

## 2014-03-24 ENCOUNTER — Encounter: Payer: Self-pay | Admitting: Internal Medicine

## 2014-03-24 VITALS — BP 112/70 | HR 80 | Temp 98.5°F | Ht 62.0 in | Wt 177.2 lb

## 2014-03-24 DIAGNOSIS — J329 Chronic sinusitis, unspecified: Secondary | ICD-10-CM | POA: Diagnosis not present

## 2014-03-24 DIAGNOSIS — M79604 Pain in right leg: Secondary | ICD-10-CM | POA: Diagnosis not present

## 2014-03-24 DIAGNOSIS — M79601 Pain in right arm: Secondary | ICD-10-CM

## 2014-03-24 NOTE — Progress Notes (Signed)
Pre visit review using our clinic review tool, if applicable. No additional management support is needed unless otherwise documented below in the visit note. 

## 2014-03-30 ENCOUNTER — Encounter: Payer: Self-pay | Admitting: Internal Medicine

## 2014-03-30 DIAGNOSIS — J329 Chronic sinusitis, unspecified: Secondary | ICD-10-CM | POA: Insufficient documentation

## 2014-03-30 DIAGNOSIS — M79601 Pain in right arm: Secondary | ICD-10-CM | POA: Insufficient documentation

## 2014-03-30 DIAGNOSIS — M79604 Pain in right leg: Secondary | ICD-10-CM | POA: Insufficient documentation

## 2014-03-30 NOTE — Progress Notes (Signed)
Subjective:    Patient ID: Jennifer Hodges, female    DOB: 08-04-1945, 69 y.o.   MRN: [id number]  Hip Pain   69 year old female with past history of hiatal hernia, esophageal ulceration, asthma, sleep apnea and mild bronchiectasis who comes in today to discuss increased hip and leg pain.  Seeing Dr Sharlet Salina for her neck and back pain.  Did not tolerate the gabapentin.  Is s/p injection of her hip.  States has bone on bone.  Has had to stop walking.  Is riding her bike.  Now seeing Rachelle Hora.  Having problems with her right hand and arm.  Due to see Dr Rudene Christians.  We discussed the need for ortho to evaluate her hip.  she also recently was diagnosed with sinus infection.  Treated with zpak.  Better.       Past Medical History  Diagnosis Date  . Hypercholesterolemia   . Bronchiectasis     mild  . Asthma   . GERD (gastroesophageal reflux disease)     EGD 8/09- non bleeding erosive gastritis, documentd esophageal ulcerations.   . Diverticulosis   . Hiatal hernia   . Sleep apnea     cpap asked to bring mask and tubing  . Chronic headaches      followed by [facility]  . Osteoarthritis     lumbar disc disease, left hip    Current Outpatient Prescriptions on File Prior to Visit  Medication Sig Dispense Refill  . bacitracin ointment Apply 1 application topically 3 (three) times daily. Apply inside nose    . Cholecalciferol (VITAMIN D PO) Take 800 Units by mouth.    . Coenzyme Q10 (CO Q 10) 100 MG CAPS Take 100 mg by mouth daily.    . cyclobenzaprine (FLEXERIL) 10 MG tablet Take 1 tablet (10 mg total) by mouth at bedtime as needed for muscle spasms. 30 tablet 0  . fish oil-omega-3 fatty acids 1000 MG capsule Take 1 g by mouth daily.    . Garlic 8127 MG CAPS Take 2 mg by mouth daily.     . Glucos-Chondroit-Hyaluron-MSM (GLUCOSAMINE CHONDROITIN JOINT PO) Take by mouth.    . hyoscyamine (LEVSIN, ANASPAZ) 0.125 MG tablet Take 0.125 mg by mouth every 4 (four) hours as needed for  cramping.    . Multiple Vitamins-Minerals (ONE-A-DAY WOMENS 50 PLUS PO) Take by mouth.    . pantoprazole (PROTONIX) 40 MG tablet Take 1 tablet (40 mg total) by mouth daily. 90 tablet 2  . promethazine (PHENERGAN) 25 MG tablet Take 25 mg by mouth every 6 (six) hours as needed for nausea.    Marland Kitchen pyridOXINE (VITAMIN B-6) 100 MG tablet Take 100 mg by mouth daily.    Marland Kitchen topiramate (TOPAMAX) 25 MG tablet Take 25 mg by mouth at bedtime.     . traMADol (ULTRAM) 50 MG tablet Take 1 tablet (50 mg total) by mouth every 6 (six) hours as needed. 60 tablet 0  . vitamin B-12 (CYANOCOBALAMIN) 1000 MCG tablet Take 1,000 mcg by mouth daily.    . vitamin E (VITAMIN E) 400 UNIT capsule Take 400 Units by mouth daily.    . Vitamin Mixture (ESTER-C PO) Take 1,000 mg by mouth 3 (three) times daily.     No current facility-administered medications on file prior to visit.    Review of Systems Recently diagnosed with sinus infection.  Treated.  Better.  No acid reflux.  Being followed by Dr Sharlet Salina for her neck and back.  S/p hip injection.  Hip bothering her more now.  Seeing ortho for her right arm pain.  Planning to f/u with Dr Rudene Christians.       Objective:   Physical Exam  Filed Vitals:   03/24/14 1409  BP: 112/70  Pulse: 80  Temp: 98.5 F (28.63 C)   69 year old female in no acute distress.  NECK:  Supple.  Nontender.  No audible bruit.  HEART:  Appears to be regular. LUNGS:  No crackles or wheezing audible.  Respirations even and unlabored.  RADIAL PULSE:  Equal bilaterally.  ABDOMEN:  Soft, nontender.  Bowel sounds present and normal.  No audible abdominal bruit.    EXTREMITIES:  No increased edema present.  DP pulses palpable and equal bilaterally.        Assessment & Plan:  1. Right leg pain Having pain in her right hip and leg.  Seeing ortho for her arm. Has bone on bone.  Has been seeing Dr Sharlet Salina.  Will f/u with ortho.    2. Right arm pain Seeing ortho.    3. Sinusitis, unspecified chronicity,  unspecified location Better after treatment.    HEALTH MAINTENANCE.  Physical 11/11/13.  GI as outlined.  S/P hysterectomy.  Mammogram 10/29/13 - Birads I.     I spent 15 minutes with the patient and more than 50% of the time was spent in consultation regarding the above.

## 2014-04-10 DIAGNOSIS — G5602 Carpal tunnel syndrome, left upper limb: Secondary | ICD-10-CM | POA: Diagnosis not present

## 2014-04-10 DIAGNOSIS — M1612 Unilateral primary osteoarthritis, left hip: Secondary | ICD-10-CM | POA: Diagnosis not present

## 2014-04-10 DIAGNOSIS — G5601 Carpal tunnel syndrome, right upper limb: Secondary | ICD-10-CM | POA: Diagnosis not present

## 2014-04-11 DIAGNOSIS — B078 Other viral warts: Secondary | ICD-10-CM | POA: Diagnosis not present

## 2014-04-11 DIAGNOSIS — L538 Other specified erythematous conditions: Secondary | ICD-10-CM | POA: Diagnosis not present

## 2014-04-11 DIAGNOSIS — L821 Other seborrheic keratosis: Secondary | ICD-10-CM | POA: Diagnosis not present

## 2014-04-21 DIAGNOSIS — Z961 Presence of intraocular lens: Secondary | ICD-10-CM | POA: Diagnosis not present

## 2014-04-21 DIAGNOSIS — M1612 Unilateral primary osteoarthritis, left hip: Secondary | ICD-10-CM | POA: Diagnosis not present

## 2014-04-21 DIAGNOSIS — M25552 Pain in left hip: Secondary | ICD-10-CM | POA: Diagnosis not present

## 2014-04-23 ENCOUNTER — Ambulatory Visit: Payer: Self-pay | Admitting: Orthopedic Surgery

## 2014-04-23 DIAGNOSIS — I10 Essential (primary) hypertension: Secondary | ICD-10-CM | POA: Diagnosis not present

## 2014-04-23 DIAGNOSIS — M79662 Pain in left lower leg: Secondary | ICD-10-CM | POA: Diagnosis not present

## 2014-04-23 DIAGNOSIS — M1612 Unilateral primary osteoarthritis, left hip: Secondary | ICD-10-CM | POA: Diagnosis not present

## 2014-04-23 DIAGNOSIS — Z01812 Encounter for preprocedural laboratory examination: Secondary | ICD-10-CM | POA: Diagnosis not present

## 2014-05-01 ENCOUNTER — Inpatient Hospital Stay: Payer: Self-pay | Admitting: Orthopedic Surgery

## 2014-05-01 DIAGNOSIS — J439 Emphysema, unspecified: Secondary | ICD-10-CM | POA: Diagnosis present

## 2014-05-01 DIAGNOSIS — I10 Essential (primary) hypertension: Secondary | ICD-10-CM | POA: Diagnosis present

## 2014-05-01 DIAGNOSIS — M5410 Radiculopathy, site unspecified: Secondary | ICD-10-CM | POA: Diagnosis present

## 2014-05-01 DIAGNOSIS — M5136 Other intervertebral disc degeneration, lumbar region: Secondary | ICD-10-CM | POA: Diagnosis present

## 2014-05-01 DIAGNOSIS — Z9049 Acquired absence of other specified parts of digestive tract: Secondary | ICD-10-CM | POA: Diagnosis present

## 2014-05-01 DIAGNOSIS — M1612 Unilateral primary osteoarthritis, left hip: Secondary | ICD-10-CM | POA: Diagnosis not present

## 2014-05-01 DIAGNOSIS — Z4732 Aftercare following explantation of hip joint prosthesis: Secondary | ICD-10-CM | POA: Diagnosis not present

## 2014-05-01 DIAGNOSIS — G4730 Sleep apnea, unspecified: Secondary | ICD-10-CM | POA: Diagnosis present

## 2014-05-01 DIAGNOSIS — Z90721 Acquired absence of ovaries, unilateral: Secondary | ICD-10-CM | POA: Diagnosis present

## 2014-05-01 DIAGNOSIS — E559 Vitamin D deficiency, unspecified: Secondary | ICD-10-CM | POA: Diagnosis present

## 2014-05-01 DIAGNOSIS — D62 Acute posthemorrhagic anemia: Secondary | ICD-10-CM | POA: Diagnosis not present

## 2014-05-01 DIAGNOSIS — E780 Pure hypercholesterolemia: Secondary | ICD-10-CM | POA: Diagnosis present

## 2014-05-01 DIAGNOSIS — I378 Other nonrheumatic pulmonary valve disorders: Secondary | ICD-10-CM | POA: Diagnosis present

## 2014-05-01 DIAGNOSIS — M169 Osteoarthritis of hip, unspecified: Secondary | ICD-10-CM | POA: Diagnosis not present

## 2014-05-01 DIAGNOSIS — Z90710 Acquired absence of both cervix and uterus: Secondary | ICD-10-CM | POA: Diagnosis not present

## 2014-05-01 DIAGNOSIS — G43909 Migraine, unspecified, not intractable, without status migrainosus: Secondary | ICD-10-CM | POA: Diagnosis present

## 2014-05-01 DIAGNOSIS — I059 Rheumatic mitral valve disease, unspecified: Secondary | ICD-10-CM | POA: Diagnosis present

## 2014-05-01 DIAGNOSIS — E119 Type 2 diabetes mellitus without complications: Secondary | ICD-10-CM | POA: Diagnosis present

## 2014-05-01 DIAGNOSIS — Z8619 Personal history of other infectious and parasitic diseases: Secondary | ICD-10-CM | POA: Diagnosis not present

## 2014-05-01 DIAGNOSIS — K219 Gastro-esophageal reflux disease without esophagitis: Secondary | ICD-10-CM | POA: Diagnosis present

## 2014-05-01 DIAGNOSIS — I209 Angina pectoris, unspecified: Secondary | ICD-10-CM | POA: Diagnosis present

## 2014-05-01 DIAGNOSIS — J45909 Unspecified asthma, uncomplicated: Secondary | ICD-10-CM | POA: Diagnosis present

## 2014-05-01 DIAGNOSIS — Z90410 Acquired total absence of pancreas: Secondary | ICD-10-CM | POA: Diagnosis present

## 2014-05-01 DIAGNOSIS — Z86010 Personal history of colonic polyps: Secondary | ICD-10-CM | POA: Diagnosis not present

## 2014-05-01 DIAGNOSIS — Z96642 Presence of left artificial hip joint: Secondary | ICD-10-CM | POA: Diagnosis not present

## 2014-05-01 DIAGNOSIS — M109 Gout, unspecified: Secondary | ICD-10-CM | POA: Diagnosis present

## 2014-05-01 DIAGNOSIS — K589 Irritable bowel syndrome without diarrhea: Secondary | ICD-10-CM | POA: Diagnosis present

## 2014-05-01 DIAGNOSIS — M792 Neuralgia and neuritis, unspecified: Secondary | ICD-10-CM | POA: Diagnosis present

## 2014-05-01 HISTORY — PX: TOTAL HIP ARTHROPLASTY: SHX124

## 2014-05-02 ENCOUNTER — Encounter: Payer: Self-pay | Admitting: *Deleted

## 2014-05-06 DIAGNOSIS — I10 Essential (primary) hypertension: Secondary | ICD-10-CM | POA: Diagnosis not present

## 2014-05-06 DIAGNOSIS — M109 Gout, unspecified: Secondary | ICD-10-CM | POA: Diagnosis not present

## 2014-05-06 DIAGNOSIS — M5136 Other intervertebral disc degeneration, lumbar region: Secondary | ICD-10-CM | POA: Diagnosis not present

## 2014-05-06 DIAGNOSIS — Z471 Aftercare following joint replacement surgery: Secondary | ICD-10-CM | POA: Diagnosis not present

## 2014-05-06 DIAGNOSIS — J45909 Unspecified asthma, uncomplicated: Secondary | ICD-10-CM | POA: Diagnosis not present

## 2014-05-06 DIAGNOSIS — M792 Neuralgia and neuritis, unspecified: Secondary | ICD-10-CM | POA: Diagnosis not present

## 2014-05-08 ENCOUNTER — Ambulatory Visit: Payer: Self-pay | Admitting: Orthopedic Surgery

## 2014-05-08 DIAGNOSIS — R2242 Localized swelling, mass and lump, left lower limb: Secondary | ICD-10-CM | POA: Diagnosis not present

## 2014-05-08 DIAGNOSIS — M5136 Other intervertebral disc degeneration, lumbar region: Secondary | ICD-10-CM | POA: Diagnosis not present

## 2014-05-08 DIAGNOSIS — Z471 Aftercare following joint replacement surgery: Secondary | ICD-10-CM | POA: Diagnosis not present

## 2014-05-08 DIAGNOSIS — M1711 Unilateral primary osteoarthritis, right knee: Secondary | ICD-10-CM | POA: Diagnosis not present

## 2014-05-08 DIAGNOSIS — M7989 Other specified soft tissue disorders: Secondary | ICD-10-CM | POA: Diagnosis not present

## 2014-05-08 DIAGNOSIS — M109 Gout, unspecified: Secondary | ICD-10-CM | POA: Diagnosis not present

## 2014-05-08 DIAGNOSIS — M792 Neuralgia and neuritis, unspecified: Secondary | ICD-10-CM | POA: Diagnosis not present

## 2014-05-08 DIAGNOSIS — I10 Essential (primary) hypertension: Secondary | ICD-10-CM | POA: Diagnosis not present

## 2014-05-08 DIAGNOSIS — J45909 Unspecified asthma, uncomplicated: Secondary | ICD-10-CM | POA: Diagnosis not present

## 2014-05-08 DIAGNOSIS — Z96642 Presence of left artificial hip joint: Secondary | ICD-10-CM | POA: Diagnosis not present

## 2014-05-08 DIAGNOSIS — M79605 Pain in left leg: Secondary | ICD-10-CM | POA: Diagnosis not present

## 2014-05-09 DIAGNOSIS — I10 Essential (primary) hypertension: Secondary | ICD-10-CM | POA: Diagnosis not present

## 2014-05-09 DIAGNOSIS — M5136 Other intervertebral disc degeneration, lumbar region: Secondary | ICD-10-CM | POA: Diagnosis not present

## 2014-05-09 DIAGNOSIS — J45909 Unspecified asthma, uncomplicated: Secondary | ICD-10-CM | POA: Diagnosis not present

## 2014-05-09 DIAGNOSIS — M792 Neuralgia and neuritis, unspecified: Secondary | ICD-10-CM | POA: Diagnosis not present

## 2014-05-09 DIAGNOSIS — Z471 Aftercare following joint replacement surgery: Secondary | ICD-10-CM | POA: Diagnosis not present

## 2014-05-09 DIAGNOSIS — M109 Gout, unspecified: Secondary | ICD-10-CM | POA: Diagnosis not present

## 2014-05-13 DIAGNOSIS — I10 Essential (primary) hypertension: Secondary | ICD-10-CM | POA: Diagnosis not present

## 2014-05-13 DIAGNOSIS — Z471 Aftercare following joint replacement surgery: Secondary | ICD-10-CM | POA: Diagnosis not present

## 2014-05-13 DIAGNOSIS — M792 Neuralgia and neuritis, unspecified: Secondary | ICD-10-CM | POA: Diagnosis not present

## 2014-05-13 DIAGNOSIS — M109 Gout, unspecified: Secondary | ICD-10-CM | POA: Diagnosis not present

## 2014-05-13 DIAGNOSIS — J45909 Unspecified asthma, uncomplicated: Secondary | ICD-10-CM | POA: Diagnosis not present

## 2014-05-13 DIAGNOSIS — M5136 Other intervertebral disc degeneration, lumbar region: Secondary | ICD-10-CM | POA: Diagnosis not present

## 2014-05-14 DIAGNOSIS — M5136 Other intervertebral disc degeneration, lumbar region: Secondary | ICD-10-CM | POA: Diagnosis not present

## 2014-05-14 DIAGNOSIS — I10 Essential (primary) hypertension: Secondary | ICD-10-CM | POA: Diagnosis not present

## 2014-05-14 DIAGNOSIS — M109 Gout, unspecified: Secondary | ICD-10-CM | POA: Diagnosis not present

## 2014-05-14 DIAGNOSIS — M792 Neuralgia and neuritis, unspecified: Secondary | ICD-10-CM | POA: Diagnosis not present

## 2014-05-14 DIAGNOSIS — J45909 Unspecified asthma, uncomplicated: Secondary | ICD-10-CM | POA: Diagnosis not present

## 2014-05-14 DIAGNOSIS — Z471 Aftercare following joint replacement surgery: Secondary | ICD-10-CM | POA: Diagnosis not present

## 2014-05-16 DIAGNOSIS — M109 Gout, unspecified: Secondary | ICD-10-CM | POA: Diagnosis not present

## 2014-05-16 DIAGNOSIS — M5136 Other intervertebral disc degeneration, lumbar region: Secondary | ICD-10-CM | POA: Diagnosis not present

## 2014-05-16 DIAGNOSIS — I10 Essential (primary) hypertension: Secondary | ICD-10-CM | POA: Diagnosis not present

## 2014-05-16 DIAGNOSIS — M792 Neuralgia and neuritis, unspecified: Secondary | ICD-10-CM | POA: Diagnosis not present

## 2014-05-16 DIAGNOSIS — M79672 Pain in left foot: Secondary | ICD-10-CM | POA: Diagnosis not present

## 2014-05-16 DIAGNOSIS — J45909 Unspecified asthma, uncomplicated: Secondary | ICD-10-CM | POA: Diagnosis not present

## 2014-05-16 DIAGNOSIS — Z471 Aftercare following joint replacement surgery: Secondary | ICD-10-CM | POA: Diagnosis not present

## 2014-05-18 ENCOUNTER — Emergency Department: Payer: Self-pay | Admitting: Emergency Medicine

## 2014-05-18 DIAGNOSIS — Z96641 Presence of right artificial hip joint: Secondary | ICD-10-CM | POA: Diagnosis not present

## 2014-05-18 DIAGNOSIS — Z79899 Other long term (current) drug therapy: Secondary | ICD-10-CM | POA: Diagnosis not present

## 2014-05-18 DIAGNOSIS — R600 Localized edema: Secondary | ICD-10-CM | POA: Diagnosis not present

## 2014-05-18 DIAGNOSIS — M7989 Other specified soft tissue disorders: Secondary | ICD-10-CM | POA: Diagnosis not present

## 2014-05-18 DIAGNOSIS — M79605 Pain in left leg: Secondary | ICD-10-CM | POA: Diagnosis not present

## 2014-05-18 DIAGNOSIS — G8918 Other acute postprocedural pain: Secondary | ICD-10-CM | POA: Diagnosis not present

## 2014-05-19 DIAGNOSIS — M5136 Other intervertebral disc degeneration, lumbar region: Secondary | ICD-10-CM | POA: Diagnosis not present

## 2014-05-19 DIAGNOSIS — I10 Essential (primary) hypertension: Secondary | ICD-10-CM | POA: Diagnosis not present

## 2014-05-19 DIAGNOSIS — M792 Neuralgia and neuritis, unspecified: Secondary | ICD-10-CM | POA: Diagnosis not present

## 2014-05-19 DIAGNOSIS — Z471 Aftercare following joint replacement surgery: Secondary | ICD-10-CM | POA: Diagnosis not present

## 2014-05-19 DIAGNOSIS — J45909 Unspecified asthma, uncomplicated: Secondary | ICD-10-CM | POA: Diagnosis not present

## 2014-05-19 DIAGNOSIS — M109 Gout, unspecified: Secondary | ICD-10-CM | POA: Diagnosis not present

## 2014-05-20 ENCOUNTER — Telehealth: Payer: Self-pay | Admitting: Internal Medicine

## 2014-05-20 NOTE — Telephone Encounter (Signed)
fyi

## 2014-05-20 NOTE — Telephone Encounter (Signed)
[facility] Patient Name: Jennifer Hodges DOB: [birth year] Initial Comment Caller states she has abdominal cramping and vomiting, concerned about food poisoning from chicken Nurse Assessment Nurse: Donalynn Furlong, RN, Myna Hidalgo Date/Time Eilene Ghazi Time): 05/20/2014 9:42:10 AM Confirm and document reason for call. If symptomatic, describe symptoms. ---Caller states she has abdominal cramping and vomiting, concerned about food poisoning from chicken Has the patient traveled out of the country within the last 30 days? ---No Does the patient require triage? ---Yes Related visit to physician within the last 2 weeks? ---Yes Does the PT have any chronic conditions? (i.e. diabetes, asthma, etc.) ---Yes List chronic conditions. ---asthma, back pain, Guidelines Guideline Title Affirmed Question Affirmed Notes Vomiting [1] SEVERE vomiting (e.g., 6 or more times/day, vomits everything) BUT [2] hydrated (all triage questions negative) Final Disposition User [place], RN, Myna Hidalgo

## 2014-05-20 NOTE — Telephone Encounter (Signed)
Please check on pt later today.  If still non stop vomiting, needs evaluation.  Either to urgent care (or ER if other acute symptoms).

## 2014-05-21 DIAGNOSIS — M792 Neuralgia and neuritis, unspecified: Secondary | ICD-10-CM | POA: Diagnosis not present

## 2014-05-21 DIAGNOSIS — Z471 Aftercare following joint replacement surgery: Secondary | ICD-10-CM | POA: Diagnosis not present

## 2014-05-21 DIAGNOSIS — J45909 Unspecified asthma, uncomplicated: Secondary | ICD-10-CM | POA: Diagnosis not present

## 2014-05-21 DIAGNOSIS — M109 Gout, unspecified: Secondary | ICD-10-CM | POA: Diagnosis not present

## 2014-05-21 DIAGNOSIS — M5136 Other intervertebral disc degeneration, lumbar region: Secondary | ICD-10-CM | POA: Diagnosis not present

## 2014-05-21 DIAGNOSIS — I10 Essential (primary) hypertension: Secondary | ICD-10-CM | POA: Diagnosis not present

## 2014-05-21 NOTE — Telephone Encounter (Signed)
Spoke with pt, she states she is not vomiting however she is nauseated.  Further states she was not seen yesterday.

## 2014-05-21 NOTE — Telephone Encounter (Signed)
Per note, it appears she was concerned about food poisoning.  [facility] [employer] apparently gave her advice.  It appears doing better now.  Just having some nausea.  No vomiting.  Bland foods.  Stay hydrated.  Let us know if symptoms persist or worsen.

## 2014-05-22 DIAGNOSIS — M722 Plantar fascial fibromatosis: Secondary | ICD-10-CM | POA: Diagnosis not present

## 2014-05-22 DIAGNOSIS — M7752 Other enthesopathy of left foot: Secondary | ICD-10-CM | POA: Diagnosis not present

## 2014-05-23 DIAGNOSIS — M109 Gout, unspecified: Secondary | ICD-10-CM | POA: Diagnosis not present

## 2014-05-23 DIAGNOSIS — J45909 Unspecified asthma, uncomplicated: Secondary | ICD-10-CM | POA: Diagnosis not present

## 2014-05-23 DIAGNOSIS — M792 Neuralgia and neuritis, unspecified: Secondary | ICD-10-CM | POA: Diagnosis not present

## 2014-05-23 DIAGNOSIS — I10 Essential (primary) hypertension: Secondary | ICD-10-CM | POA: Diagnosis not present

## 2014-05-23 DIAGNOSIS — Z471 Aftercare following joint replacement surgery: Secondary | ICD-10-CM | POA: Diagnosis not present

## 2014-05-23 DIAGNOSIS — M5136 Other intervertebral disc degeneration, lumbar region: Secondary | ICD-10-CM | POA: Diagnosis not present

## 2014-05-26 ENCOUNTER — Other Ambulatory Visit: Payer: Self-pay | Admitting: Internal Medicine

## 2014-05-27 ENCOUNTER — Encounter: Payer: Self-pay | Admitting: Internal Medicine

## 2014-05-27 ENCOUNTER — Ambulatory Visit (INDEPENDENT_AMBULATORY_CARE_PROVIDER_SITE_OTHER): Payer: Medicare Other | Admitting: Internal Medicine

## 2014-05-27 VITALS — BP 110/60 | HR 79 | Temp 98.3°F | Ht 62.0 in | Wt 170.4 lb

## 2014-05-27 DIAGNOSIS — M5030 Other cervical disc degeneration, unspecified cervical region: Secondary | ICD-10-CM | POA: Diagnosis not present

## 2014-05-27 DIAGNOSIS — Z471 Aftercare following joint replacement surgery: Secondary | ICD-10-CM | POA: Diagnosis not present

## 2014-05-27 DIAGNOSIS — I10 Essential (primary) hypertension: Secondary | ICD-10-CM | POA: Diagnosis not present

## 2014-05-27 DIAGNOSIS — M810 Age-related osteoporosis without current pathological fracture: Secondary | ICD-10-CM | POA: Diagnosis not present

## 2014-05-27 DIAGNOSIS — M109 Gout, unspecified: Secondary | ICD-10-CM | POA: Diagnosis not present

## 2014-05-27 DIAGNOSIS — E7800 Pure hypercholesterolemia, unspecified: Secondary | ICD-10-CM

## 2014-05-27 DIAGNOSIS — Z0000 Encounter for general adult medical examination without abnormal findings: Secondary | ICD-10-CM

## 2014-05-27 DIAGNOSIS — K219 Gastro-esophageal reflux disease without esophagitis: Secondary | ICD-10-CM

## 2014-05-27 DIAGNOSIS — J479 Bronchiectasis, uncomplicated: Secondary | ICD-10-CM

## 2014-05-27 DIAGNOSIS — R197 Diarrhea, unspecified: Secondary | ICD-10-CM

## 2014-05-27 DIAGNOSIS — R0781 Pleurodynia: Secondary | ICD-10-CM

## 2014-05-27 DIAGNOSIS — J45909 Unspecified asthma, uncomplicated: Secondary | ICD-10-CM | POA: Diagnosis not present

## 2014-05-27 DIAGNOSIS — M79673 Pain in unspecified foot: Secondary | ICD-10-CM

## 2014-05-27 DIAGNOSIS — M792 Neuralgia and neuritis, unspecified: Secondary | ICD-10-CM | POA: Diagnosis not present

## 2014-05-27 DIAGNOSIS — M5136 Other intervertebral disc degeneration, lumbar region: Secondary | ICD-10-CM | POA: Diagnosis not present

## 2014-05-27 MED ORDER — FIRST-DUKES MOUTHWASH MT SUSP: OROMUCOSAL | Status: DC

## 2014-05-27 NOTE — Progress Notes (Signed)
Pre visit review using our clinic review tool, if applicable. No additional management support is needed unless otherwise documented below in the visit note. 

## 2014-05-30 ENCOUNTER — Telehealth: Payer: Self-pay | Admitting: *Deleted

## 2014-05-30 DIAGNOSIS — J45909 Unspecified asthma, uncomplicated: Secondary | ICD-10-CM | POA: Diagnosis not present

## 2014-05-30 DIAGNOSIS — K219 Gastro-esophageal reflux disease without esophagitis: Secondary | ICD-10-CM

## 2014-05-30 DIAGNOSIS — M109 Gout, unspecified: Secondary | ICD-10-CM | POA: Diagnosis not present

## 2014-05-30 DIAGNOSIS — M5136 Other intervertebral disc degeneration, lumbar region: Secondary | ICD-10-CM | POA: Diagnosis not present

## 2014-05-30 DIAGNOSIS — I10 Essential (primary) hypertension: Secondary | ICD-10-CM | POA: Diagnosis not present

## 2014-05-30 DIAGNOSIS — M792 Neuralgia and neuritis, unspecified: Secondary | ICD-10-CM | POA: Diagnosis not present

## 2014-05-30 DIAGNOSIS — J479 Bronchiectasis, uncomplicated: Secondary | ICD-10-CM

## 2014-05-30 DIAGNOSIS — E7800 Pure hypercholesterolemia, unspecified: Secondary | ICD-10-CM

## 2014-05-30 DIAGNOSIS — Z471 Aftercare following joint replacement surgery: Secondary | ICD-10-CM | POA: Diagnosis not present

## 2014-05-30 NOTE — Telephone Encounter (Signed)
Pt coming in Monday what labs and dx?

## 2014-05-31 NOTE — Telephone Encounter (Signed)
Order placed for labs.

## 2014-06-01 DIAGNOSIS — M79673 Pain in unspecified foot: Secondary | ICD-10-CM | POA: Insufficient documentation

## 2014-06-01 DIAGNOSIS — R197 Diarrhea, unspecified: Secondary | ICD-10-CM | POA: Insufficient documentation

## 2014-06-01 DIAGNOSIS — Z0000 Encounter for general adult medical examination without abnormal findings: Secondary | ICD-10-CM | POA: Insufficient documentation

## 2014-06-01 NOTE — Assessment & Plan Note (Signed)
C-spine MRI revealed multilevel DDD.  Seeing Dr Sharlet Salina.

## 2014-06-01 NOTE — Assessment & Plan Note (Signed)
Better.  Appetite better.  Bowels back to normal.  Good bowel movement this am.  Follow.

## 2014-06-01 NOTE — Assessment & Plan Note (Signed)
Seeing podiatry.  Better with ice and heat.

## 2014-06-01 NOTE — Assessment & Plan Note (Signed)
Bone density 05/02/13 - osteopenia.  Recheck vitamin D level.

## 2014-06-01 NOTE — Assessment & Plan Note (Signed)
Physical 11/11/13.  mammogram 10/29/13 - Birads I.

## 2014-06-01 NOTE — Assessment & Plan Note (Signed)
On protonix.  Symptoms controlled.   

## 2014-06-01 NOTE — Assessment & Plan Note (Signed)
Low cholesterol diet and exercise.  Follow lipid panel.   

## 2014-06-01 NOTE — Assessment & Plan Note (Signed)
No pain reported.

## 2014-06-01 NOTE — Progress Notes (Signed)
Patient ID: Jennifer Hodges, female   DOB: 1945-05-14, 69 y.o.   MRN: [id number]   Subjective:    Patient ID: Jennifer Hodges, female    DOB: 10/19/1945, 69 y.o.   MRN: [id number]  HPI  Patient here for a scheduled follow up.  Recently had what she felt was food poisoning.  Bowels are back to normal now.  Good bowel movement this am.  Saw ortho.  S/p cortisone injection - right knee. Saw Dr Cleda Mccreedy - heel pain.  Doing ice water bath and alternating heat.  She is trying to watch her cholesterol.  No cardiac symptoms with increased activity or exertion.  Breathing stable.  Appetite back now.    Past Medical History  Diagnosis Date  . Hypercholesterolemia   . Bronchiectasis     mild  . Asthma   . GERD (gastroesophageal reflux disease)     EGD 8/09- non bleeding erosive gastritis, documentd esophageal ulcerations.   . Diverticulosis   . Hiatal hernia   . Sleep apnea     cpap asked to bring mask and tubing  . Chronic headaches      followed by [facility]  . Osteoarthritis     lumbar disc disease, left hip    Current Outpatient Prescriptions on File Prior to Visit  Medication Sig Dispense Refill  . Cholecalciferol (VITAMIN D PO) Take 800 Units by mouth.    . Coenzyme Q10 (CO Q 10) 100 MG CAPS Take 100 mg by mouth daily.    . cyclobenzaprine (FLEXERIL) 10 MG tablet Take 1 tablet (10 mg total) by mouth at bedtime as needed for muscle spasms. 30 tablet 0  . fish oil-omega-3 fatty acids 1000 MG capsule Take 1 g by mouth daily.    . Garlic 6789 MG CAPS Take 2 mg by mouth daily.     . Glucos-Chondroit-Hyaluron-MSM (GLUCOSAMINE CHONDROITIN JOINT PO) Take by mouth.    . hyoscyamine (LEVSIN, ANASPAZ) 0.125 MG tablet Take 0.125 mg by mouth every 4 (four) hours as needed for cramping.    . Multiple Vitamins-Minerals (ONE-A-DAY WOMENS 50 PLUS PO) Take by mouth.    . pantoprazole (PROTONIX) 40 MG tablet TAKE 1 TABLET DAILY 90 tablet 1  . promethazine (PHENERGAN) 25 MG tablet Take 25  mg by mouth every 6 (six) hours as needed for nausea.    Marland Kitchen pyridOXINE (VITAMIN B-6) 100 MG tablet Take 100 mg by mouth daily.    Marland Kitchen topiramate (TOPAMAX) 25 MG tablet Take 25 mg by mouth at bedtime.     . traMADol (ULTRAM) 50 MG tablet Take 1 tablet (50 mg total) by mouth every 6 (six) hours as needed. 60 tablet 0  . vitamin B-12 (CYANOCOBALAMIN) 1000 MCG tablet Take 1,000 mcg by mouth daily.    . vitamin E (VITAMIN E) 400 UNIT capsule Take 400 Units by mouth daily.    . Vitamin Mixture (ESTER-C PO) Take 1,000 mg by mouth 3 (three) times daily.     No current facility-administered medications on file prior to visit.    Review of Systems  Constitutional: Negative for unexpected weight change.       Appetite back now.  Feels better.    HENT: Negative for congestion and sinus pressure.   Respiratory: Negative for cough, chest tightness and shortness of breath.   Cardiovascular: Negative for chest pain, palpitations and leg swelling.  Gastrointestinal: Negative for nausea, vomiting, abdominal pain and diarrhea.  Musculoskeletal: Negative for joint swelling.  Skin: Negative for  color change and rash.  Neurological: Negative for dizziness, light-headedness and headaches.       Objective:    Physical Exam  Constitutional: She appears well-developed and well-nourished. No distress.  HENT:  Nose: Nose normal.  Mouth/Throat: Oropharynx is clear and moist.  Neck: Neck supple. No thyromegaly present.  Cardiovascular: Normal rate and regular rhythm.   Pulmonary/Chest: Breath sounds normal. No respiratory distress. She has no wheezes.  Abdominal: Soft. Bowel sounds are normal. There is no tenderness.  Musculoskeletal: She exhibits no edema or tenderness.  Lymphadenopathy:    She has no cervical adenopathy.  Skin: No rash noted. No erythema.    BP 110/60 mmHg  Pulse 79  Temp(Src) 98.3 F (36.8 C) (Oral)  Ht 5\' 2"  (1.575 m)  Wt 170 lb 6 oz (77.282 kg)  BMI 31.15 kg/m2  SpO2 95% Wt  Readings from Last 3 Encounters:  05/27/14 170 lb 6 oz (77.282 kg)  03/24/14 177 lb 4 oz (80.4 kg)  11/11/13 167 lb (75.751 kg)     Lab Results  Component Value Date   WBC 4.7 07/25/2013   HGB 12.9 07/25/2013   HCT 37.8 07/25/2013   PLT 239.0 07/25/2013   GLUCOSE 83 11/11/2013   CHOL 216* 11/11/2013   TRIG 114.0 11/11/2013   HDL 51.90 11/11/2013   LDLDIRECT 147.5 11/07/2012   LDLCALC 141* 11/11/2013   ALT 22 11/11/2013   AST 25 11/11/2013   NA 141 11/11/2013   K 4.6 11/11/2013   CL 107 11/11/2013   CREATININE 0.8 11/11/2013   BUN 14 11/11/2013   CO2 27 11/11/2013   TSH 2.46 07/25/2013   HGBA1C 5.7 11/11/2013       Assessment & Plan:   Problem List Items Addressed This Visit    Bronchiectasis    Mild.  Sees Dr [name]. Breathing stable.        Degenerative disc disease, cervical    C-spine MRI revealed multilevel DDD.  Seeing Dr Sharlet Salina.        Relevant Medications   predniSONE (DELTASONE) 5 MG tablet   oxyCODONE (OXY IR/ROXICODONE) 5 MG immediate release tablet   Diarrhea    Better.  Appetite better.  Bowels back to normal.  Good bowel movement this am.  Follow.        GERD (gastroesophageal reflux disease) - Primary    On protonix.  Symptoms controlled.        Health care maintenance    Physical 11/11/13.  mammogram 10/29/13 - Birads I.        Heel pain    Seeing podiatry.  Better with ice and heat.        Hypercholesterolemia    Low cholesterol diet and exercise.  Follow lipid panel.        Osteoporosis    Bone density 05/02/13 - osteopenia.  Recheck vitamin D level.        Rib pain    No pain reported.          I spent 25 minutes with the patient and more than 50% of the time was spent in consultation regarding the above.     Einar Pheasant, MD

## 2014-06-01 NOTE — Assessment & Plan Note (Signed)
Mild.  Sees Dr [name]. Breathing stable.

## 2014-06-02 ENCOUNTER — Other Ambulatory Visit (INDEPENDENT_AMBULATORY_CARE_PROVIDER_SITE_OTHER): Payer: Medicare Other

## 2014-06-02 DIAGNOSIS — E7800 Pure hypercholesterolemia, unspecified: Secondary | ICD-10-CM

## 2014-06-02 DIAGNOSIS — K219 Gastro-esophageal reflux disease without esophagitis: Secondary | ICD-10-CM

## 2014-06-02 DIAGNOSIS — J479 Bronchiectasis, uncomplicated: Secondary | ICD-10-CM

## 2014-06-02 LAB — LIPID PANEL
Cholesterol: 171 mg/dL (ref 0–200)
HDL: 49.7 mg/dL (ref >39.00)
LDL Cholesterol: 92 mg/dL (ref 0–99)
NonHDL: 121.3
Total CHOL/HDL Ratio: 3
Triglycerides: 145 mg/dL (ref 0.0–149.0)
VLDL: 29 mg/dL (ref 0.0–40.0)

## 2014-06-02 LAB — COMPREHENSIVE METABOLIC PANEL
ALT: 16 U/L (ref 0–35)
AST: 19 U/L (ref 0–37)
Albumin: 3.9 g/dL (ref 3.5–5.2)
Alkaline Phosphatase: 82 U/L (ref 39–117)
BUN: 12 mg/dL (ref 6–23)
CO2: 29 mEq/L (ref 19–32)
Calcium: 9.4 mg/dL (ref 8.4–10.5)
Chloride: 106 mEq/L (ref 96–112)
Creatinine, Ser: 0.6 mg/dL (ref 0.40–1.20)
GFR: 105 mL/min (ref >60.00)
Glucose, Bld: 93 mg/dL (ref 70–99)
Potassium: 4.9 mEq/L (ref 3.5–5.1)
Sodium: 141 mEq/L (ref 135–145)
Total Bilirubin: 0.5 mg/dL (ref 0.2–1.2)
Total Protein: 5.9 g/dL — ABNORMAL LOW (ref 6.0–8.3)

## 2014-06-02 LAB — CBC WITH DIFFERENTIAL/PLATELET
Basophils Absolute: 0 10*3/uL (ref 0.0–0.1)
Basophils Relative: 0.6 % (ref 0.0–3.0)
Eosinophils Absolute: 0.1 10*3/uL (ref 0.0–0.7)
Eosinophils Relative: 2.4 % (ref 0.0–5.0)
HCT: 37.4 % (ref 36.0–46.0)
Hemoglobin: 12.3 g/dL (ref 12.0–15.0)
Lymphocytes Relative: 34.7 % (ref 12.0–46.0)
Lymphs Abs: 1.6 10*3/uL (ref 0.7–4.0)
MCHC: 32.9 g/dL (ref 30.0–36.0)
MCV: 89.8 fl (ref 78.0–100.0)
Monocytes Absolute: 0.3 10*3/uL (ref 0.1–1.0)
Monocytes Relative: 6.9 % (ref 3.0–12.0)
Neutro Abs: 2.5 10*3/uL (ref 1.4–7.7)
Neutrophils Relative %: 55.4 % (ref 43.0–77.0)
Platelets: 271 10*3/uL (ref 150.0–400.0)
RBC: 4.16 Mil/uL (ref 3.87–5.11)
RDW: 14.4 % (ref 11.5–15.5)
WBC: 4.6 10*3/uL (ref 4.0–10.5)

## 2014-06-03 ENCOUNTER — Encounter: Payer: Self-pay | Admitting: *Deleted

## 2014-06-03 DIAGNOSIS — I10 Essential (primary) hypertension: Secondary | ICD-10-CM | POA: Diagnosis not present

## 2014-06-03 DIAGNOSIS — M109 Gout, unspecified: Secondary | ICD-10-CM | POA: Diagnosis not present

## 2014-06-03 DIAGNOSIS — M792 Neuralgia and neuritis, unspecified: Secondary | ICD-10-CM | POA: Diagnosis not present

## 2014-06-03 DIAGNOSIS — J45909 Unspecified asthma, uncomplicated: Secondary | ICD-10-CM | POA: Diagnosis not present

## 2014-06-03 DIAGNOSIS — Z471 Aftercare following joint replacement surgery: Secondary | ICD-10-CM | POA: Diagnosis not present

## 2014-06-03 DIAGNOSIS — M5136 Other intervertebral disc degeneration, lumbar region: Secondary | ICD-10-CM | POA: Diagnosis not present

## 2014-06-04 ENCOUNTER — Other Ambulatory Visit: Payer: Medicare Other

## 2014-06-06 DIAGNOSIS — M5136 Other intervertebral disc degeneration, lumbar region: Secondary | ICD-10-CM | POA: Diagnosis not present

## 2014-06-06 DIAGNOSIS — M792 Neuralgia and neuritis, unspecified: Secondary | ICD-10-CM | POA: Diagnosis not present

## 2014-06-06 DIAGNOSIS — M109 Gout, unspecified: Secondary | ICD-10-CM | POA: Diagnosis not present

## 2014-06-06 DIAGNOSIS — Z471 Aftercare following joint replacement surgery: Secondary | ICD-10-CM | POA: Diagnosis not present

## 2014-06-06 DIAGNOSIS — I10 Essential (primary) hypertension: Secondary | ICD-10-CM | POA: Diagnosis not present

## 2014-06-06 DIAGNOSIS — J45909 Unspecified asthma, uncomplicated: Secondary | ICD-10-CM | POA: Diagnosis not present

## 2014-06-10 DIAGNOSIS — M792 Neuralgia and neuritis, unspecified: Secondary | ICD-10-CM | POA: Diagnosis not present

## 2014-06-10 DIAGNOSIS — M5136 Other intervertebral disc degeneration, lumbar region: Secondary | ICD-10-CM | POA: Diagnosis not present

## 2014-06-10 DIAGNOSIS — M109 Gout, unspecified: Secondary | ICD-10-CM | POA: Diagnosis not present

## 2014-06-10 DIAGNOSIS — I10 Essential (primary) hypertension: Secondary | ICD-10-CM | POA: Diagnosis not present

## 2014-06-10 DIAGNOSIS — Z471 Aftercare following joint replacement surgery: Secondary | ICD-10-CM | POA: Diagnosis not present

## 2014-06-10 DIAGNOSIS — J45909 Unspecified asthma, uncomplicated: Secondary | ICD-10-CM | POA: Diagnosis not present

## 2014-06-11 DIAGNOSIS — Z96642 Presence of left artificial hip joint: Secondary | ICD-10-CM | POA: Diagnosis not present

## 2014-06-12 ENCOUNTER — Telehealth: Payer: Self-pay | Admitting: *Deleted

## 2014-06-12 DIAGNOSIS — J45909 Unspecified asthma, uncomplicated: Secondary | ICD-10-CM | POA: Diagnosis not present

## 2014-06-12 DIAGNOSIS — Z471 Aftercare following joint replacement surgery: Secondary | ICD-10-CM | POA: Diagnosis not present

## 2014-06-12 DIAGNOSIS — M109 Gout, unspecified: Secondary | ICD-10-CM | POA: Diagnosis not present

## 2014-06-12 DIAGNOSIS — M792 Neuralgia and neuritis, unspecified: Secondary | ICD-10-CM | POA: Diagnosis not present

## 2014-06-12 DIAGNOSIS — I10 Essential (primary) hypertension: Secondary | ICD-10-CM | POA: Diagnosis not present

## 2014-06-12 DIAGNOSIS — M5136 Other intervertebral disc degeneration, lumbar region: Secondary | ICD-10-CM | POA: Diagnosis not present

## 2014-06-12 NOTE — Telephone Encounter (Signed)
Pt called requesting a referral to [facility]umat[facility], Dr Jefm Bryant, for [place].  Please advise

## 2014-06-16 DIAGNOSIS — Z471 Aftercare following joint replacement surgery: Secondary | ICD-10-CM | POA: Diagnosis not present

## 2014-06-16 DIAGNOSIS — M109 Gout, unspecified: Secondary | ICD-10-CM | POA: Diagnosis not present

## 2014-06-16 DIAGNOSIS — I10 Essential (primary) hypertension: Secondary | ICD-10-CM | POA: Diagnosis not present

## 2014-06-16 DIAGNOSIS — M5136 Other intervertebral disc degeneration, lumbar region: Secondary | ICD-10-CM | POA: Diagnosis not present

## 2014-06-16 DIAGNOSIS — J45909 Unspecified asthma, uncomplicated: Secondary | ICD-10-CM | POA: Diagnosis not present

## 2014-06-16 DIAGNOSIS — M792 Neuralgia and neuritis, unspecified: Secondary | ICD-10-CM | POA: Diagnosis not present

## 2014-06-16 NOTE — Telephone Encounter (Signed)
The patient called back asking for the referral to be placed for her to see Dr.Kernodle.  I explained the msg that was on the encounter, but the patient is hoping for more information.

## 2014-06-16 NOTE — Telephone Encounter (Signed)
Spoke with pt states Dr Rudene Christians who performed her left hip replacement in Feb, 2016 says she has severe Osteoporosis and she needed to start Reclast therapy and PCP would need to refer pt to Rheumatology.  Please advise

## 2014-06-16 NOTE — Telephone Encounter (Signed)
Left message on VM to return call

## 2014-06-16 NOTE — Telephone Encounter (Signed)
Her last bone density did not reveal osteoporosis (reveals osteopenia).  Would hold on reclast at this time.  Continue weight bearing exercise (i.e., walking) and calcium with vitamin D.

## 2014-06-17 ENCOUNTER — Other Ambulatory Visit: Payer: Self-pay | Admitting: Internal Medicine

## 2014-06-17 DIAGNOSIS — M810 Age-related osteoporosis without current pathological fracture: Secondary | ICD-10-CM

## 2014-06-17 NOTE — Telephone Encounter (Signed)
I have completed as much of the prior authorization form as I can .  She will need a urinalysis for me to finish the form.  She can come in and leave a sample.  I can send this over to rheumatology for prior authorization.  Not sure if they will cover, since bone density does not show osteoporosis.  Schedule for her to come in for urinalysis to complete form.

## 2014-06-17 NOTE — Telephone Encounter (Signed)
Pt scheduled 4.7.16 at 10:45 am for UA

## 2014-06-17 NOTE — Progress Notes (Signed)
Order placed for urinalysis

## 2014-06-18 DIAGNOSIS — M792 Neuralgia and neuritis, unspecified: Secondary | ICD-10-CM | POA: Diagnosis not present

## 2014-06-18 DIAGNOSIS — Z471 Aftercare following joint replacement surgery: Secondary | ICD-10-CM | POA: Diagnosis not present

## 2014-06-18 DIAGNOSIS — M5136 Other intervertebral disc degeneration, lumbar region: Secondary | ICD-10-CM | POA: Diagnosis not present

## 2014-06-18 DIAGNOSIS — J45909 Unspecified asthma, uncomplicated: Secondary | ICD-10-CM | POA: Diagnosis not present

## 2014-06-18 DIAGNOSIS — M109 Gout, unspecified: Secondary | ICD-10-CM | POA: Diagnosis not present

## 2014-06-18 DIAGNOSIS — I10 Essential (primary) hypertension: Secondary | ICD-10-CM | POA: Diagnosis not present

## 2014-06-19 ENCOUNTER — Telehealth: Payer: Self-pay | Admitting: *Deleted

## 2014-06-19 ENCOUNTER — Other Ambulatory Visit (INDEPENDENT_AMBULATORY_CARE_PROVIDER_SITE_OTHER): Payer: Medicare Other

## 2014-06-19 DIAGNOSIS — M810 Age-related osteoporosis without current pathological fracture: Secondary | ICD-10-CM | POA: Diagnosis not present

## 2014-06-19 DIAGNOSIS — M7752 Other enthesopathy of left foot: Secondary | ICD-10-CM | POA: Diagnosis not present

## 2014-06-19 DIAGNOSIS — M722 Plantar fascial fibromatosis: Secondary | ICD-10-CM | POA: Diagnosis not present

## 2014-06-19 LAB — URINALYSIS, ROUTINE W REFLEX MICROSCOPIC
Bilirubin Urine: NEGATIVE
Hgb urine dipstick: NEGATIVE
Ketones, ur: NEGATIVE
Leukocytes, UA: NEGATIVE
Nitrite: NEGATIVE
RBC / HPF: NONE SEEN (ref 0–?)
Specific Gravity, Urine: <=1.005 — AB (ref 1.000–1.030)
Total Protein, Urine: NEGATIVE
Urine Glucose: NEGATIVE
Urobilinogen, UA: 0.2 (ref 0.0–1.0)
pH: 7.5 (ref 5.0–8.0)

## 2014-06-19 NOTE — Telephone Encounter (Signed)
Pt called states Dr Caryl Comes, Podiatry, wants ok from Dr Nicki Reaper to prescribe Lamisil for foot fungus.  Please advise

## 2014-06-19 NOTE — Telephone Encounter (Signed)
Need to confirm that she is not taking cholesterol medication.  Also, need to inform her that Lamisil can affect her liver. She will need liver panel checked regularly while on the medication.

## 2014-06-20 NOTE — Telephone Encounter (Signed)
Spoke with pt, advised of MDs message.  She states she is not on Cholesterol medication.  Pt verbalized understanding and stated she would call Dr Caryl Comes and make him aware.

## 2014-06-24 DIAGNOSIS — J45909 Unspecified asthma, uncomplicated: Secondary | ICD-10-CM | POA: Diagnosis not present

## 2014-06-24 DIAGNOSIS — M5136 Other intervertebral disc degeneration, lumbar region: Secondary | ICD-10-CM | POA: Diagnosis not present

## 2014-06-24 DIAGNOSIS — I10 Essential (primary) hypertension: Secondary | ICD-10-CM | POA: Diagnosis not present

## 2014-06-24 DIAGNOSIS — Z471 Aftercare following joint replacement surgery: Secondary | ICD-10-CM | POA: Diagnosis not present

## 2014-06-24 DIAGNOSIS — M792 Neuralgia and neuritis, unspecified: Secondary | ICD-10-CM | POA: Diagnosis not present

## 2014-06-24 DIAGNOSIS — M109 Gout, unspecified: Secondary | ICD-10-CM | POA: Diagnosis not present

## 2014-06-25 ENCOUNTER — Telehealth: Payer: Self-pay | Admitting: Internal Medicine

## 2014-06-25 NOTE — Telephone Encounter (Signed)
Completed form for reclast as requested.  Placed in your box.

## 2014-06-26 DIAGNOSIS — I10 Essential (primary) hypertension: Secondary | ICD-10-CM | POA: Diagnosis not present

## 2014-06-26 DIAGNOSIS — M5136 Other intervertebral disc degeneration, lumbar region: Secondary | ICD-10-CM | POA: Diagnosis not present

## 2014-06-26 DIAGNOSIS — J45909 Unspecified asthma, uncomplicated: Secondary | ICD-10-CM | POA: Diagnosis not present

## 2014-06-26 DIAGNOSIS — M792 Neuralgia and neuritis, unspecified: Secondary | ICD-10-CM | POA: Diagnosis not present

## 2014-06-26 DIAGNOSIS — M109 Gout, unspecified: Secondary | ICD-10-CM | POA: Diagnosis not present

## 2014-06-26 DIAGNOSIS — Z471 Aftercare following joint replacement surgery: Secondary | ICD-10-CM | POA: Diagnosis not present

## 2014-07-01 ENCOUNTER — Telehealth: Payer: Self-pay

## 2014-07-01 DIAGNOSIS — M5136 Other intervertebral disc degeneration, lumbar region: Secondary | ICD-10-CM | POA: Diagnosis not present

## 2014-07-01 DIAGNOSIS — Z471 Aftercare following joint replacement surgery: Secondary | ICD-10-CM | POA: Diagnosis not present

## 2014-07-01 DIAGNOSIS — M109 Gout, unspecified: Secondary | ICD-10-CM | POA: Diagnosis not present

## 2014-07-01 DIAGNOSIS — J45909 Unspecified asthma, uncomplicated: Secondary | ICD-10-CM | POA: Diagnosis not present

## 2014-07-01 DIAGNOSIS — M792 Neuralgia and neuritis, unspecified: Secondary | ICD-10-CM | POA: Diagnosis not present

## 2014-07-01 DIAGNOSIS — I10 Essential (primary) hypertension: Secondary | ICD-10-CM | POA: Diagnosis not present

## 2014-07-01 NOTE — Telephone Encounter (Signed)
The patient called and is hoping to get an update on her reclasp (spelling?)

## 2014-07-01 NOTE — Telephone Encounter (Signed)
Reclast referral from was faxed to [facility] on 06/25/14. I refaxed it again today asking for a status. Pt notified

## 2014-07-02 NOTE — Telephone Encounter (Signed)
Form was faxed on 4/13 & 4/19 (pt notified)

## 2014-07-03 DIAGNOSIS — M792 Neuralgia and neuritis, unspecified: Secondary | ICD-10-CM | POA: Diagnosis not present

## 2014-07-03 DIAGNOSIS — M109 Gout, unspecified: Secondary | ICD-10-CM | POA: Diagnosis not present

## 2014-07-03 DIAGNOSIS — J45909 Unspecified asthma, uncomplicated: Secondary | ICD-10-CM | POA: Diagnosis not present

## 2014-07-03 DIAGNOSIS — M5136 Other intervertebral disc degeneration, lumbar region: Secondary | ICD-10-CM | POA: Diagnosis not present

## 2014-07-03 DIAGNOSIS — I10 Essential (primary) hypertension: Secondary | ICD-10-CM | POA: Diagnosis not present

## 2014-07-03 DIAGNOSIS — Z471 Aftercare following joint replacement surgery: Secondary | ICD-10-CM | POA: Diagnosis not present

## 2014-07-05 NOTE — Op Note (Signed)
PATIENT NAME:  Jennifer Hodges, Jennifer Hodges MR#:  [id number] DATE OF BIRTH:  [birth year]  DATE OF PROCEDURE:  01/07/2014  PREOPERATIVE DIAGNOSIS:  Nuclear sclerotic cataract of the left eye.   POSTOPERATIVE DIAGNOSIS:  Nuclear sclerotic cataract of the left eye.   OPERATIVE PROCEDURE:  Cataract extraction by phacoemulsification with implant of intraocular lens to left eye.   SURGEON:  Livingston Diones. Tyeasha Ebbs, MD  ANESTHESIA:  1. Managed anesthesia care.  2. Topical tetracaine drops followed by 2% Xylocaine jelly applied in the preoperative holding area.   COMPLICATIONS:  None.   TECHNIQUE:   Stop and chop.  DESCRIPTION OF PROCEDURE:  The patient was examined and consented in the preoperative holding area where the aforementioned topical anesthesia was applied to the left eye and then brought back to the Operating Room where the left eye was prepped and draped in the usual sterile ophthalmic fashion and a lid speculum was placed. A paracentesis was created with the side port blade and the anterior chamber was filled with viscoelastic. A near clear corneal incision was performed with the steel keratome. A continuous curvilinear capsulorrhexis was performed with a cystotome followed by the capsulorrhexis forceps. Hydrodissection and hydrodelineation were carried out with BSS on a blunt cannula. The lens was removed in a stop and chop technique and the remaining cortical material was removed with the irrigation-aspiration handpiece. The capsular bag was inflated with viscoelastic and the Tecnis ZCB00 27.0-diopter lens, serial number [id number], was placed in the capsular bag without complication. The remaining viscoelastic was removed from the eye with the irrigation-aspiration handpiece. The wounds were hydrated. The anterior chamber was flushed with Miostat and the eye was inflated to physiologic pressure. 0.1 mL of cefuroxime concentration 10 mg/mL was placed in the anterior chamber. The wounds were found to be water  tight. The eye was dressed with Vigamox. The patient was given protective glasses to wear throughout the day and a shield with which to sleep tonight. The patient was also given drops with which to begin a drop regimen today and will follow up with me in one day.    ____________________________ Livingston Diones. Joydan Gretzinger, MD wlp:nb D: 01/07/2014 15:39:53 ET T: 01/08/2014 03:53:44 ET JOB#: [id number]  cc: Robbert Langlinais L. Maudine Kluesner, MD, <Dictator> Livingston Diones Roisin Mones MD ELECTRONICALLY SIGNED 01/08/2014 14:13

## 2014-07-05 NOTE — Op Note (Signed)
PATIENT NAME:  Jennifer Hodges, Jennifer Hodges I MR#:  [id number] DATE OF BIRTH:  03/02/1946  DATE OF PROCEDURE:  12/24/2013  PREOPERATIVE DIAGNOSIS: Visually significant cataract of the right eye.   POSTOPERATIVE DIAGNOSIS: Visually significant cataract of the right eye.   OPERATIVE PROCEDURE: Cataract extraction by phacoemulsification with implant of intraocular lens to right eye.   SURGEON: Birder Robson, MD.   ANESTHESIA:  1. Managed anesthesia care.  2. Topical tetracaine drops followed by 2% Xylocaine jelly applied in the preoperative holding area.   COMPLICATIONS: None.   TECHNIQUE:  Stop and chop.   DESCRIPTION OF PROCEDURE: The patient was examined and consented in the preoperative holding area where the aforementioned topical anesthesia was applied to the right eye and then brought back to the Operating Room where the right eye was prepped and draped in the usual sterile ophthalmic fashion and a lid speculum was placed. A paracentesis was created with the side port blade and the anterior chamber was filled with viscoelastic. A near clear corneal incision was performed with the steel keratome. A continuous curvilinear capsulorrhexis was performed with a cystotome followed by the capsulorrhexis forceps. Hydrodissection and hydrodelineation were carried out with BSS on a blunt cannula. The lens was removed in a stop and chop technique and the remaining cortical material was removed with the irrigation-aspiration handpiece. The capsular bag was inflated with viscoelastic and the Tecnis ZCB00 27.0-diopter lens, serial number [id number] was placed in the capsular bag without complication. The remaining viscoelastic was removed from the eye with the irrigation-aspiration handpiece. The wounds were hydrated. The anterior chamber was flushed with Miostat and the eye was inflated to physiologic pressure. 0.1 mL of cefuroxime concentration 10 mg/mL was placed in the anterior chamber. The wounds were found to be  water tight. The eye was dressed with Vigamox. The patient was given protective glasses to wear throughout the day and a shield with which to sleep tonight. The patient was also given drops with which to begin a drop regimen today and will follow-up with me in one day.    ____________________________ Livingston Diones. Leanne Sisler, MD wlp:JT D: 12/24/2013 21:42:13 ET T: 12/25/2013 00:10:06 ET JOB#: [id number]  cc: Alexey Rhoads L. Nusaiba Guallpa, MD, <Dictator> Livingston Diones Carneshia Raker MD ELECTRONICALLY SIGNED 12/25/2013 10:19

## 2014-07-06 DIAGNOSIS — M5136 Other intervertebral disc degeneration, lumbar region: Secondary | ICD-10-CM | POA: Diagnosis not present

## 2014-07-06 DIAGNOSIS — Z471 Aftercare following joint replacement surgery: Secondary | ICD-10-CM | POA: Diagnosis not present

## 2014-07-06 DIAGNOSIS — M792 Neuralgia and neuritis, unspecified: Secondary | ICD-10-CM | POA: Diagnosis not present

## 2014-07-06 DIAGNOSIS — M109 Gout, unspecified: Secondary | ICD-10-CM | POA: Diagnosis not present

## 2014-07-07 LAB — SURGICAL PATHOLOGY

## 2014-07-13 NOTE — Op Note (Signed)
PATIENT NAME:  Jennifer Hodges, Jennifer Hodges MR#:  [id number] DATE OF BIRTH:  Aug 05, 1945  DATE OF PROCEDURE:  05/01/2014  PREOPERATIVE DIAGNOSIS: Left hip severe osteoarthritis.   POSTOPERATIVE DIAGNOSIS:  Left hip severe osteoarthritis.  PROCEDURE: Left total hip replacement.   ANESTHESIA: Spinal.   SURGEON: [name], M.D.   DESCRIPTION OF PROCEDURE: The patient was brought to the Operating Room and after adequate anesthesia was obtained, the right leg was placed on a well-padded table, left leg in the Medacta attachment. After this C-arm was brought in and good visualization of the hip could be obtained. After prepping and draping in the usual sterile fashion, appropriate patient identification and timeout procedure were completed. Direct anterior approach was made centered over the TFL and greater trochanter. The incision was carried down through the skin and subcutaneous tissue with hemostasis being achieved with electrocautery.  The fascia of the tensor was identified and retracted laterally. The deep fascia was incised and the lateral femoral vessels ligated. The anterior capsule was exposed and a capsulotomy created, femoral head was removed without difficulty after femoral neck cut and it showed significant central wear consistent with preoperative findings.  The acetabulum was quite sclerotic. Reaming was carried out to 52 mm, at which point there was good bleeding bone and a 52 mm cup fit well and a 52 mm cup was impacted into place and was quite stable. The leg was externally rotated and sequentially broached to a size 4 and a size 3 cemented stem was prepared with a distal cement restrictor brushing of the canal irrigation and drying of the canal, bone cement was injected down the canal with pressurization and a three AMI cemented stem was inserted held until the cement was hard, excess cement having been removed. The trials were made off of this and an S 28 head to go with the liner for the 52 mm cup  were assembled and impacted onto the stem. The hip was reduced and was stable with appropriate leg length. The wound was thoroughly irrigated with Betadine solution and then closed with running Quill suture for the deep fascia, a subcutaneous drain, 2-0 Quill subcutaneously and skin staples with 30 mL 0.25% Sensorcaine infiltrated for postoperative analgesia.   ESTIMATED BLOOD LOSS: 350.   COMPLICATIONS: None.   SPECIMENS: Removed femoral head.     ____________________________ Laurene Footman, MD mjm:at D: 05/01/2014 20:14:39 ET T: 05/01/2014 21:02:43 ET JOB#: [id number]  cc: Laurene Footman, MD, <Dictator> Laurene Footman MD ELECTRONICALLY SIGNED 05/02/2014 7:12

## 2014-07-13 NOTE — Discharge Summary (Signed)
PATIENT NAME:  Jennifer Hodges, Jennifer Hodges MR#:  [id number] DATE OF BIRTH:  [birth year]  DATE OF ADMISSION:  05/01/2014 DATE OF DISCHARGE:  05/05/2014  ADMITTING DIAGNOSIS: Left hip severe osteoarthritis.   DISCHARGE DIAGNOSIS: Left hip severe osteoarthritis.   PROCEDURE: Left total hip replacement.   ANESTHESIA: Spinal.   SURGEON: Laurene Footman, M.D.   ESTIMATED BLOOD LOSS: 350 mL.   COMPLICATIONS: None.   SPECIMEN: Removed femoral head.   HISTORY: The patient is a 69 year old female who has had significant left hip pain for many years. She can manage socks and shoes with mild difficulty. She can sit in a chair for one half hour. She can transfer from sitting to standing with use of arms. She can manage stairs by other methods. She is unable to pick up an object from the floor. She does not require aid. She can carry with significant limits. She is able to drive a car. She does not require gait aide.   PHYSICAL EXAMINATION:  GENERAL:  No apparent distress. Normal affect. Well-developed.  EYES: Pupils were equal, round, and reactive to light.  LYMPHATIC: No adenopathy.  CARDIOVASCULAR: Regular rate and rhythm.  RESPIRATORY: Clear and nonlabored breathing.  HEENT: Normal.  VASCULAR: No edema.  NEUROLOGIC AND PSYCHIATRIC:  Normal mood.  MUSCULOSKELETAL: Normal except for left hip pain with internal rotation. Limited hip internal rotation when compared to the right hip. She has a positive Trendelenburg test. She is 1 cm short on the left leg. She has normal muscle strength in the left lower extremity.   HOSPITAL COURSE: The patient was admitted to the hospital on 05/01/2014. She had surgery that same day and was brought to the orthopedic floor from the PACU in stable condition. On postoperative day 1, the patient had an acute postoperative blood loss anemia with a hemoglobin of 11.4. Vital signs are stable. The patient had good progress throughout the first day with physical therapy. On  postoperative day 2, 3, and 4 the patient continued to progress with physical therapy and by postoperative day 4, she was stable and ready for discharge to home with home health PT.   CONDITION AT DISCHARGE: Stable.   DISCHARGE INSTRUCTIONS: The patient can increase weight-bearing activity as tolerated. Thigh-high TED hose on both legs, remove at nighttime and replace when arising the next morning. The patient may resume a regular diet as tolerated. Take a deep breath every hour. The patient needs to call [facility] for any redness, warmth, or drainage along the incision site. She is to call for any calf pain or lower extremity pain or weakness. She needs to keep incision site clean and dry. Call [place] for appointment in 2 weeks. She will continue with home health physical therapy at home. The patient will continue with postoperative medications as listed in the discharge instructions.    ____________________________ T. Rachelle Hora, PA-C tcg:mc D: 05/05/2014 14:07:57 ET T: 05/05/2014 14:33:46 ET JOB#: [id number]  cc: T. Rachelle Hora, PA-C, <Dictator> Duanne Guess [place] ELECTRONICALLY SIGNED 05/06/2014 12:18

## 2014-07-22 ENCOUNTER — Encounter
Admission: RE | Admit: 2014-07-22 | Discharge: 2014-07-22 | Disposition: A | Discharge status: Home / Self Care | Payer: Medicare Other | Source: Ambulatory Visit | Attending: Orthopedic Surgery | Admitting: Orthopedic Surgery

## 2014-07-22 DIAGNOSIS — Z01812 Encounter for preprocedural laboratory examination: Secondary | ICD-10-CM | POA: Insufficient documentation

## 2014-07-22 DIAGNOSIS — K449 Diaphragmatic hernia without obstruction or gangrene: Secondary | ICD-10-CM | POA: Diagnosis not present

## 2014-07-22 DIAGNOSIS — K219 Gastro-esophageal reflux disease without esophagitis: Secondary | ICD-10-CM | POA: Diagnosis not present

## 2014-07-22 DIAGNOSIS — E780 Pure hypercholesterolemia: Secondary | ICD-10-CM | POA: Insufficient documentation

## 2014-07-22 DIAGNOSIS — K5790 Diverticulosis of intestine, part unspecified, without perforation or abscess without bleeding: Secondary | ICD-10-CM | POA: Diagnosis not present

## 2014-07-22 LAB — BASIC METABOLIC PANEL
Anion gap: 8 (ref 5–15)
BUN: 17 mg/dL (ref 6–20)
CO2: 27 mmol/L (ref 22–32)
Calcium: 9 mg/dL (ref 8.9–10.3)
Chloride: 105 mmol/L (ref 101–111)
Creatinine, Ser: 0.44 mg/dL (ref 0.44–1.00)
GFR calc Af Amer: >60 mL/min (ref >60)
GFR calc non Af Amer: >60 mL/min (ref >60)
Glucose, Bld: 91 mg/dL (ref 65–99)
Potassium: 4.3 mmol/L (ref 3.5–5.1)
Sodium: 140 mmol/L (ref 135–145)

## 2014-07-22 LAB — URINALYSIS COMPLETE WITH MICROSCOPIC (ARMC ONLY)
Bacteria, UA: NONE SEEN
Bilirubin Urine: NEGATIVE
Glucose, UA: NEGATIVE mg/dL
Hgb urine dipstick: NEGATIVE
Ketones, ur: NEGATIVE mg/dL
Leukocytes, UA: NEGATIVE
Nitrite: NEGATIVE
Protein, ur: NEGATIVE mg/dL
RBC / HPF: NONE SEEN RBC/hpf (ref 0–5)
Specific Gravity, Urine: 1.013 (ref 1.005–1.030)
Squamous Epithelial / LPF: NONE SEEN
pH: 6 (ref 5.0–8.0)

## 2014-07-22 LAB — CBC
HCT: 37 % (ref 35.0–47.0)
Hemoglobin: 12.3 g/dL (ref 12.0–16.0)
MCH: 29.6 pg (ref 26.0–34.0)
MCHC: 33.2 g/dL (ref 32.0–36.0)
MCV: 89.3 fL (ref 80.0–100.0)
Platelets: 234 10*3/uL (ref 150–440)
RBC: 4.15 MIL/uL (ref 3.80–5.20)
RDW: 14.3 % (ref 11.5–14.5)
WBC: 4 10*3/uL (ref 3.6–11.0)

## 2014-07-22 LAB — SURGICAL PCR SCREEN
MRSA, PCR: NEGATIVE
Staphylococcus aureus: NEGATIVE

## 2014-07-22 LAB — TYPE AND SCREEN
ABO/RH(D): AB POS
Antibody Screen: NEGATIVE

## 2014-07-22 LAB — PROTIME-INR
INR: 0.92
Prothrombin Time: 12.6 seconds (ref 11.4–15.0)

## 2014-07-22 LAB — APTT: aPTT: 29 seconds (ref 24–36)

## 2014-07-22 LAB — SEDIMENTATION RATE: Sed Rate: 1 mm/hr (ref 0–30)

## 2014-07-22 NOTE — Patient Instructions (Addendum)
  Your procedure is scheduled on: Tuesday Aug 05, 2014 Report to Same Day Surgery. To find out your arrival time please call [phone] between 1PM - 3PM on Monday May 23,2016.  Remember: Instructions that are not followed completely may result in serious medical risk, up to and including death, or upon the discretion of your surgeon and anesthesiologist your surgery may need to be rescheduled.    __x__ 1. Do not eat food or drink liquids after midnight. No gum chewing or hard candies.     __x__ 2. No Alcohol for 24 hours before or after surgery.   ____ 3. Bring all medications with you on the day of surgery if instructed.    __x__ 4. Notify your doctor if there is any change in your medical condition     (cold, fever, infections).     Do not wear jewelry, make-up, hairpins, clips or nail polish.  Do not wear lotions, powders, or perfumes. You may wear deodorant.  Do not shave 48 hours prior to surgery. Men may shave face and neck.  Do not bring valuables to the hospital.    [facility] is not responsible for any belongings or valuables.               Contacts, dentures or bridgework may not be worn into surgery.  Leave your suitcase in the car. After surgery it may be brought to your room.  For patients admitted to the hospital, discharge time is determined by your                treatment team.   Patients discharged the day of surgery will not be allowed to drive home.   Please read over the following fact sheets that you were given:   [facility] Preparing for Surgery  MRSA _x___ Take these medicines the morning of surgery with A SIP OF WATER:    1. Protonix  2. Magnesium    ____ Fleet Enema (as directed)   __x__ Use CHG Soap as directed  ____ Use inhalers on the day of surgery  ____ Stop metformin 2 days prior to surgery    ____ Take 1/2 of usual insulin dose the night before surgery and none on the morning of surgery.   ____ Stop Coumadin/Plavix/aspirin on does  not apply.  __x__ Stop Anti-inflammatories on does not apply.  Pt can take tramadol or tylenol for pain.   ____ Stop supplements until after surgery.    __x__ Bring C-Pap to the hospital.

## 2014-07-28 DIAGNOSIS — B351 Tinea unguium: Secondary | ICD-10-CM | POA: Diagnosis not present

## 2014-07-29 LAB — ABO/RH: ABO/RH(D): AB POS

## 2014-07-29 MED ORDER — FENTANYL CITRATE (PF) 100 MCG/2ML IJ SOLN
INTRAMUSCULAR | Status: AC
  Filled 2014-07-29: qty 2

## 2014-08-05 ENCOUNTER — Encounter
Admission: RE | Disposition: A | Discharge status: Home / Self Care | Payer: Self-pay | Source: Ambulatory Visit | Attending: Orthopedic Surgery

## 2014-08-05 ENCOUNTER — Inpatient Hospital Stay: Payer: Medicare Other | Admitting: Anesthesiology

## 2014-08-05 ENCOUNTER — Encounter: Payer: Self-pay | Admitting: *Deleted

## 2014-08-05 ENCOUNTER — Inpatient Hospital Stay
Admission: RE | Admit: 2014-08-05 | Discharge: 2014-08-08 | DRG: 470 | Disposition: A | Discharge status: Home / Self Care | Payer: Medicare Other | Source: Ambulatory Visit | Attending: Orthopedic Surgery | Admitting: Orthopedic Surgery

## 2014-08-05 ENCOUNTER — Inpatient Hospital Stay: Payer: Medicare Other

## 2014-08-05 DIAGNOSIS — J45909 Unspecified asthma, uncomplicated: Secondary | ICD-10-CM | POA: Diagnosis present

## 2014-08-05 DIAGNOSIS — Z96641 Presence of right artificial hip joint: Secondary | ICD-10-CM | POA: Diagnosis not present

## 2014-08-05 DIAGNOSIS — M810 Age-related osteoporosis without current pathological fracture: Secondary | ICD-10-CM | POA: Diagnosis not present

## 2014-08-05 DIAGNOSIS — G4730 Sleep apnea, unspecified: Secondary | ICD-10-CM | POA: Diagnosis present

## 2014-08-05 DIAGNOSIS — E780 Pure hypercholesterolemia: Secondary | ICD-10-CM | POA: Diagnosis present

## 2014-08-05 DIAGNOSIS — Z96649 Presence of unspecified artificial hip joint: Secondary | ICD-10-CM

## 2014-08-05 DIAGNOSIS — M1611 Unilateral primary osteoarthritis, right hip: Secondary | ICD-10-CM | POA: Diagnosis not present

## 2014-08-05 DIAGNOSIS — Z419 Encounter for procedure for purposes other than remedying health state, unspecified: Secondary | ICD-10-CM

## 2014-08-05 DIAGNOSIS — M1610 Unilateral primary osteoarthritis, unspecified hip: Secondary | ICD-10-CM | POA: Diagnosis present

## 2014-08-05 DIAGNOSIS — Z471 Aftercare following joint replacement surgery: Secondary | ICD-10-CM | POA: Diagnosis not present

## 2014-08-05 DIAGNOSIS — K219 Gastro-esophageal reflux disease without esophagitis: Secondary | ICD-10-CM | POA: Diagnosis present

## 2014-08-05 HISTORY — PX: TOTAL HIP ARTHROPLASTY: SHX124

## 2014-08-05 LAB — CREATININE, SERUM
Creatinine, Ser: 0.46 mg/dL (ref 0.44–1.00)
GFR calc Af Amer: >60 mL/min (ref >60)
GFR calc non Af Amer: >60 mL/min (ref >60)

## 2014-08-05 LAB — CBC
HCT: 32.4 % — ABNORMAL LOW (ref 35.0–47.0)
Hemoglobin: 10.6 g/dL — ABNORMAL LOW (ref 12.0–16.0)
MCH: 29.2 pg (ref 26.0–34.0)
MCHC: 32.9 g/dL (ref 32.0–36.0)
MCV: 88.9 fL (ref 80.0–100.0)
Platelets: 189 10*3/uL (ref 150–440)
RBC: 3.64 MIL/uL — ABNORMAL LOW (ref 3.80–5.20)
RDW: 14.3 % (ref 11.5–14.5)
WBC: 6.5 10*3/uL (ref 3.6–11.0)

## 2014-08-05 LAB — GLUCOSE, CAPILLARY: Glucose-Capillary: 87 mg/dL (ref 65–99)

## 2014-08-05 SURGERY — ARTHROPLASTY, HIP, TOTAL, ANTERIOR APPROACH
Anesthesia: Spinal | Site: Hip | Laterality: Right | Wound class: Clean

## 2014-08-05 MED ORDER — DOCUSATE SODIUM 100 MG PO CAPS
100.0000 mg | ORAL_CAPSULE | Freq: Two times a day (BID) | ORAL | Status: DC
  Administered 2014-08-05 – 2014-08-08 (×6): 100 mg via ORAL
  Filled 2014-08-05 (×6): qty 1

## 2014-08-05 MED ORDER — NEOMYCIN-POLYMYXIN B GU 40-200000 IR SOLN
Status: DC | PRN
  Administered 2014-08-05: 4 mL

## 2014-08-05 MED ORDER — MEPERIDINE HCL 25 MG/ML IJ SOLN
25.0000 mg | INTRAMUSCULAR | Status: DC | PRN
Start: 2014-08-05 — End: 2014-08-08
  Administered 2014-08-05 – 2014-08-06 (×3): 25 mg via INTRAVENOUS
  Filled 2014-08-05 (×3): qty 1

## 2014-08-05 MED ORDER — SODIUM CHLORIDE 0.9 % IV SOLN
INTRAVENOUS | Status: DC
  Administered 2014-08-05 – 2014-08-06 (×3): via INTRAVENOUS

## 2014-08-05 MED ORDER — MAGNESIUM OXIDE 400 (241.3 MG) MG PO TABS
400.0000 mg | ORAL_TABLET | Freq: Two times a day (BID) | ORAL | Status: DC
  Administered 2014-08-05 – 2014-08-08 (×6): 400 mg via ORAL
  Filled 2014-08-05 (×6): qty 1

## 2014-08-05 MED ORDER — KETAMINE HCL 50 MG/ML IJ SOLN
INTRAMUSCULAR | Status: DC | PRN
  Administered 2014-08-05 (×2): 50 mg via INTRAMUSCULAR

## 2014-08-05 MED ORDER — PROPOFOL INFUSION 10 MG/ML OPTIME
INTRAVENOUS | Status: DC | PRN
  Administered 2014-08-05: 30 ug/kg/min via INTRAVENOUS

## 2014-08-05 MED ORDER — LACTATED RINGERS IV SOLN
INTRAVENOUS | Status: DC
  Administered 2014-08-05 (×3): via INTRAVENOUS

## 2014-08-05 MED ORDER — MAGNESIUM HYDROXIDE 400 MG/5ML PO SUSP
30.0000 mL | Freq: Every day | ORAL | Status: DC | PRN
  Administered 2014-08-07: 30 mL via ORAL
  Filled 2014-08-05 (×2): qty 30

## 2014-08-05 MED ORDER — ONDANSETRON HCL 4 MG/2ML IJ SOLN: 4.0000 mg | Freq: Once | INTRAMUSCULAR | Status: DC | PRN

## 2014-08-05 MED ORDER — MIDAZOLAM HCL 5 MG/5ML IJ SOLN
INTRAMUSCULAR | Status: DC | PRN
  Administered 2014-08-05: 2 mg via INTRAVENOUS

## 2014-08-05 MED ORDER — BUPIVACAINE-EPINEPHRINE (PF) 0.25% -1:200000 IJ SOLN
INTRAMUSCULAR | Status: AC
  Filled 2014-08-05: qty 30

## 2014-08-05 MED ORDER — FOLIC ACID 1 MG PO TABS
0.5000 mg | ORAL_TABLET | Freq: Every day | ORAL | Status: DC
  Administered 2014-08-06 – 2014-08-08 (×3): 0.5 mg via ORAL
  Filled 2014-08-05 (×3): qty 1

## 2014-08-05 MED ORDER — ONDANSETRON HCL 4 MG/2ML IJ SOLN
4.0000 mg | Freq: Four times a day (QID) | INTRAMUSCULAR | Status: DC | PRN
  Administered 2014-08-05: 4 mg via INTRAVENOUS
  Filled 2014-08-05: qty 2

## 2014-08-05 MED ORDER — FOLIC ACID 800 MCG PO TABS
400.0000 ug | ORAL_TABLET | ORAL | Status: DC
  Filled 2014-08-05: qty 0.5

## 2014-08-05 MED ORDER — FENTANYL CITRATE (PF) 100 MCG/2ML IJ SOLN
INTRAMUSCULAR | Status: AC
  Filled 2014-08-05: qty 2

## 2014-08-05 MED ORDER — MENTHOL 3 MG MT LOZG
1.0000 | LOZENGE | OROMUCOSAL | Status: DC | PRN
  Filled 2014-08-05: qty 9

## 2014-08-05 MED ORDER — VITAMIN B-12 1000 MCG PO TABS
1000.0000 ug | ORAL_TABLET | Freq: Every day | ORAL | Status: DC
  Administered 2014-08-06 – 2014-08-08 (×3): 1000 ug via ORAL
  Filled 2014-08-05 (×4): qty 1

## 2014-08-05 MED ORDER — TOPIRAMATE 100 MG PO TABS
50.0000 mg | ORAL_TABLET | Freq: Every day | ORAL | Status: DC
  Administered 2014-08-05: 100 mg via ORAL
  Administered 2014-08-06: 50 mg via ORAL
  Administered 2014-08-07: 21:00:00 via ORAL
  Filled 2014-08-05 (×3): qty 1

## 2014-08-05 MED ORDER — HYDROMORPHONE HCL 1 MG/ML IJ SOLN
INTRAMUSCULAR | Status: AC
  Filled 2014-08-05: qty 1

## 2014-08-05 MED ORDER — BISACODYL 10 MG RE SUPP: 10.0000 mg | Freq: Every day | RECTAL | Status: DC | PRN

## 2014-08-05 MED ORDER — CEFAZOLIN SODIUM-DEXTROSE 2-3 GM-% IV SOLR
INTRAVENOUS | Status: AC
  Administered 2014-08-05: 2 g via INTRAVENOUS
  Filled 2014-08-05: qty 50

## 2014-08-05 MED ORDER — PANTOPRAZOLE SODIUM 40 MG PO TBEC
40.0000 mg | DELAYED_RELEASE_TABLET | Freq: Every day | ORAL | Status: DC
  Administered 2014-08-06 – 2014-08-08 (×3): 40 mg via ORAL
  Filled 2014-08-05 (×3): qty 1

## 2014-08-05 MED ORDER — CEFAZOLIN SODIUM-DEXTROSE 2-3 GM-% IV SOLR: 2.0000 g | Freq: Once | INTRAVENOUS | Status: DC

## 2014-08-05 MED ORDER — MAGNESIUM GLUCONATE 500 MG PO TABS
500.0000 mg | ORAL_TABLET | Freq: Two times a day (BID) | ORAL | Status: DC
  Filled 2014-08-05 (×2): qty 1

## 2014-08-05 MED ORDER — VITAMIN E 180 MG (400 UNIT) PO CAPS
400.0000 [IU] | ORAL_CAPSULE | Freq: Every day | ORAL | Status: DC
  Administered 2014-08-06 – 2014-08-08 (×3): 400 [IU] via ORAL
  Filled 2014-08-05 (×4): qty 1

## 2014-08-05 MED ORDER — OXYCODONE HCL 5 MG PO TABS
5.0000 mg | ORAL_TABLET | ORAL | Status: DC | PRN
  Administered 2014-08-05 – 2014-08-06 (×4): 5 mg via ORAL
  Administered 2014-08-06: 10 mg via ORAL
  Administered 2014-08-06: 5 mg via ORAL
  Administered 2014-08-06 – 2014-08-07 (×4): 10 mg via ORAL
  Administered 2014-08-07: 5 mg via ORAL
  Administered 2014-08-07: 10 mg via ORAL
  Administered 2014-08-07 – 2014-08-08 (×2): 5 mg via ORAL
  Filled 2014-08-05 (×2): qty 1
  Filled 2014-08-05 (×2): qty 2
  Filled 2014-08-05 (×2): qty 1
  Filled 2014-08-05: qty 2
  Filled 2014-08-05: qty 1
  Filled 2014-08-05 (×3): qty 2
  Filled 2014-08-05 (×2): qty 1
  Filled 2014-08-05: qty 2
  Filled 2014-08-05: qty 1

## 2014-08-05 MED ORDER — BUPIVACAINE HCL (PF) 0.5 % IJ SOLN
INTRAMUSCULAR | Status: DC | PRN
  Administered 2014-08-05: 3 mL

## 2014-08-05 MED ORDER — ONDANSETRON HCL 4 MG PO TABS
4.0000 mg | ORAL_TABLET | Freq: Four times a day (QID) | ORAL | Status: DC | PRN
  Administered 2014-08-07: 4 mg via ORAL
  Filled 2014-08-05: qty 1

## 2014-08-05 MED ORDER — VITAMIN B-6 50 MG PO TABS
100.0000 mg | ORAL_TABLET | Freq: Every day | ORAL | Status: DC
  Administered 2014-08-06 – 2014-08-08 (×3): 100 mg via ORAL
  Filled 2014-08-05: qty 1
  Filled 2014-08-05 (×3): qty 2

## 2014-08-05 MED ORDER — OMEGA-3 FATTY ACIDS 1000 MG PO CAPS
1.0000 g | ORAL_CAPSULE | ORAL | Status: DC
  Administered 2014-08-06: 1 g via ORAL
  Filled 2014-08-05 (×6): qty 1

## 2014-08-05 MED ORDER — METOCLOPRAMIDE HCL 5 MG/ML IJ SOLN
10.0000 mg | Freq: Four times a day (QID) | INTRAMUSCULAR | Status: AC
  Administered 2014-08-05 – 2014-08-06 (×4): 10 mg via INTRAVENOUS
  Filled 2014-08-05 (×4): qty 2

## 2014-08-05 MED ORDER — PROPOFOL 10 MG/ML IV BOLUS
INTRAVENOUS | Status: DC | PRN
  Administered 2014-08-05: 20 mg via INTRAVENOUS

## 2014-08-05 MED ORDER — MORPHINE SULFATE 2 MG/ML IJ SOLN
2.0000 mg | INTRAMUSCULAR | Status: DC | PRN
  Administered 2014-08-05: 2 mg via INTRAVENOUS
  Filled 2014-08-05: qty 1

## 2014-08-05 MED ORDER — TERBINAFINE HCL 250 MG PO TABS
250.0000 mg | ORAL_TABLET | ORAL | Status: DC
  Administered 2014-08-06 – 2014-08-08 (×3): 250 mg via ORAL
  Filled 2014-08-05 (×5): qty 1

## 2014-08-05 MED ORDER — CEFAZOLIN SODIUM 1-5 GM-% IV SOLN
1.0000 g | Freq: Four times a day (QID) | INTRAVENOUS | Status: AC
  Administered 2014-08-05 (×2): 1 g via INTRAVENOUS
  Filled 2014-08-05 (×6): qty 50

## 2014-08-05 MED ORDER — CALCIUM CARBONATE ANTACID 500 MG PO CHEW
1.0000 | CHEWABLE_TABLET | ORAL | Status: DC
  Administered 2014-08-06 – 2014-08-08 (×3): 200 mg via ORAL
  Filled 2014-08-05 (×3): qty 1

## 2014-08-05 MED ORDER — PHENOL 1.4 % MT LIQD
1.0000 | OROMUCOSAL | Status: DC | PRN
  Filled 2014-08-05: qty 177

## 2014-08-05 MED ORDER — HYDROMORPHONE HCL 1 MG/ML IJ SOLN
0.2500 mg | INTRAMUSCULAR | Status: DC | PRN
  Administered 2014-08-05 (×4): 0.25 mg via INTRAVENOUS

## 2014-08-05 MED ORDER — HYDROMORPHONE HCL 1 MG/ML IJ SOLN
INTRAMUSCULAR | Status: AC
Start: 2014-08-05 — End: 2014-08-05
  Filled 2014-08-05: qty 1

## 2014-08-05 MED ORDER — SODIUM CHLORIDE 0.9 % IV SOLN
10000.0000 ug | INTRAVENOUS | Status: DC | PRN
  Administered 2014-08-05 (×5): 100 ug via INTRAVENOUS

## 2014-08-05 MED ORDER — PROMETHAZINE HCL 25 MG PO TABS
25.0000 mg | ORAL_TABLET | Freq: Four times a day (QID) | ORAL | Status: DC | PRN
  Administered 2014-08-05: 25 mg via ORAL
  Filled 2014-08-05: qty 1

## 2014-08-05 MED ORDER — HYOSCYAMINE SULFATE 0.125 MG PO TABS
0.1250 mg | ORAL_TABLET | ORAL | Status: DC | PRN
  Filled 2014-08-05: qty 1

## 2014-08-05 MED ORDER — ACETAMINOPHEN 325 MG PO TABS
650.0000 mg | ORAL_TABLET | Freq: Four times a day (QID) | ORAL | Status: DC | PRN
Start: 2014-08-05 — End: 2014-08-08

## 2014-08-05 MED ORDER — ACETAMINOPHEN 650 MG RE SUPP: 650.0000 mg | Freq: Four times a day (QID) | RECTAL | Status: DC | PRN

## 2014-08-05 MED ORDER — TRANEXAMIC ACID 1000 MG/10ML IV SOLN
1000.0000 mg | INTRAVENOUS | Status: AC
  Administered 2014-08-05: 1000 mg via INTRAVENOUS
  Filled 2014-08-05: qty 10

## 2014-08-05 MED ORDER — CYCLOBENZAPRINE HCL 10 MG PO TABS: 10.0000 mg | ORAL_TABLET | Freq: Every evening | ORAL | Status: DC | PRN

## 2014-08-05 MED ORDER — ENOXAPARIN SODIUM 40 MG/0.4ML ~~LOC~~ SOLN
40.0000 mg | SUBCUTANEOUS | Status: DC
  Administered 2014-08-06 – 2014-08-07 (×2): 40 mg via SUBCUTANEOUS
  Filled 2014-08-05 (×2): qty 0.4

## 2014-08-05 MED ORDER — BUPIVACAINE-EPINEPHRINE 0.25% -1:200000 IJ SOLN
INTRAMUSCULAR | Status: DC | PRN
  Administered 2014-08-05: 30 mL

## 2014-08-05 MED ORDER — MAGNESIUM CITRATE PO SOLN
1.0000 | Freq: Once | ORAL | Status: AC | PRN
Start: 2014-08-05 — End: 2014-08-05
  Filled 2014-08-05: qty 296

## 2014-08-05 SURGICAL SUPPLY — 53 items
BLADE SAW 1/2 (BLADE) ×2 IMPLANT
BNDG COHESIVE 6X5 TAN STRL LF (GAUZE/BANDAGES/DRESSINGS) ×4 IMPLANT
CANISTER SUCT 1200ML W/VALVE (MISCELLANEOUS) ×2 IMPLANT
CANISTER SUCT 3000ML (MISCELLANEOUS) ×2 IMPLANT
CAPT HIP TOTAL 3 ×2 IMPLANT
CATH FOL LEG HOLDER (MISCELLANEOUS) ×2 IMPLANT
CATH TRAY 16F METER LATEX (MISCELLANEOUS) ×2 IMPLANT
CEMENT BONE 1-PACK (Cement) ×4 IMPLANT
CHLORAPREP W/TINT 26ML (MISCELLANEOUS) ×2 IMPLANT
DRAPE C-ARM XRAY 36X54 (DRAPES) ×2 IMPLANT
DRAPE INCISE IOBAN 66X60 STRL (DRAPES) ×2 IMPLANT
DRAPE POUCH INSTRU U-SHP 10X18 (DRAPES) ×2 IMPLANT
DRAPE SHEET LG 3/4 BI-LAMINATE (DRAPES) ×6 IMPLANT
DRAPE TABLE BACK 80X90 (DRAPES) ×2 IMPLANT
ELECT BLADE 6.5 EXT (BLADE) ×2 IMPLANT
GAUZE PETRO XEROFOAM 1X8 (MISCELLANEOUS) ×2 IMPLANT
GAUZE SPONGE 4X4 12PLY STRL (GAUZE/BANDAGES/DRESSINGS) ×2 IMPLANT
GLOVE BIOGEL PI IND STRL 9 (GLOVE) ×2 IMPLANT
GLOVE BIOGEL PI INDICATOR 9 (GLOVE) ×2
GLOVE SURG ORTHO 9.0 STRL STRW (GLOVE) ×4 IMPLANT
GOWN SPECIALTY ULTRA XL (MISCELLANEOUS) ×2 IMPLANT
GOWN STRL REUS W/ TWL LRG LVL3 (GOWN DISPOSABLE) ×1 IMPLANT
GOWN STRL REUS W/TWL LRG LVL3 (GOWN DISPOSABLE) ×1
HEMOVAC 400CC 10FR (MISCELLANEOUS) ×2 IMPLANT
HEMOVAC 400ML (MISCELLANEOUS) ×2
HOOD PEEL AWAY FACE SHEILD DIS (HOOD) ×2 IMPLANT
KIT DRAIN HEMOVAC JP 7FR 400ML (MISCELLANEOUS) ×1 IMPLANT
MAT BLUE FLOOR 46X72 FLO (MISCELLANEOUS) ×2 IMPLANT
NDL SAFETY 18GX1.5 (NEEDLE) ×2 IMPLANT
NEEDLE SPNL 18GX3.5 QUINCKE PK (NEEDLE) ×2 IMPLANT
NS IRRIG 1000ML POUR BTL (IV SOLUTION) ×2 IMPLANT
PACK HIP COMPR (MISCELLANEOUS) ×2 IMPLANT
PAD ABD DERMACEA PRESS 5X9 (GAUZE/BANDAGES/DRESSINGS) ×4 IMPLANT
PRESSURIZER FEM CANAL M (MISCELLANEOUS) ×2 IMPLANT
RESTRICTOR CEMENT (MISCELLANEOUS) ×2 IMPLANT
STAPLER SKIN PROX 35W (STAPLE) ×2 IMPLANT
STRAP SAFETY BODY (MISCELLANEOUS) ×4 IMPLANT
SUT DVC 2 QUILL PDO  T11 36X36 (SUTURE)
SUT DVC 2 QUILL PDO T11 36X36 (SUTURE) IMPLANT
SUT DVC QUILL MONODERM 30X30 (SUTURE) ×2 IMPLANT
SUT ETHIBOND NAB CT1 #1 30IN (SUTURE) ×2 IMPLANT
SUT SILK 0 (SUTURE)
SUT SILK 0 30XBRD TIE 6 (SUTURE) IMPLANT
SUT VIC AB 1 CT1 36 (SUTURE) IMPLANT
SYR 20CC LL (SYRINGE) ×2 IMPLANT
SYR 30ML LL (SYRINGE) ×2 IMPLANT
TAPE MICROFOAM 4IN (TAPE) ×2 IMPLANT
TUBE KAMVAC SUCTION (TUBING) ×2 IMPLANT
WATER STERILE IRR 1000ML POUR (IV SOLUTION) IMPLANT
WAVEGUIDE EIGR WIDE/ANG 102521 (MISCELLANEOUS) ×2 IMPLANT
acetabular shell versafitcup dm ×2 IMPLANT
dm liner 28 mm ×2 IMPLANT
femoral head size L 28mm offset +3.5 ×2 IMPLANT

## 2014-08-05 NOTE — Anesthesia Postprocedure Evaluation (Signed)
  Anesthesia Post-op Note  Patient: Jennifer Hodges  Procedure(s) Performed: Procedure(s): TOTAL HIP ARTHROPLASTY ANTERIOR APPROACH (Right)  Anesthesia type:Spinal  Patient location: PACU  Post pain: Pain level controlled  Post assessment: Post-op Vital signs reviewed, Patient's Cardiovascular Status Stable, Respiratory Function Stable, Patent Airway and No signs of Nausea or vomiting  Post vital signs: Reviewed and stable  Last Vitals:  Filed Vitals:   08/05/14 0646  BP: 124/64  Pulse: 77  Temp: 36.7 C  Resp: 18    Level of consciousness: awake, alert  and patient cooperative  Complications: No apparent anesthesia complications

## 2014-08-05 NOTE — H&P (Signed)
Reviewed paper H+P, will be scanned into chart. No changes noted.  

## 2014-08-05 NOTE — Progress Notes (Signed)
Right TKR with Dr. Rudene Christians. POD 0. Patient has had nausea & vomiting since surgery. Spoke to Dr. Rudene Christians regarding problem and he prescribed a course of reglan.  Patient states that the pain meds that have been prescribed cause her nausea & vomiting, not a true allergy. Difficulty controlling pain most of the afternoon. Administered Demerol and patient states improvement & she is resting quietly. VSS this afternoon. Hemovac & foley in place & patent. Continue to monitor patient for pain and nausea.

## 2014-08-05 NOTE — Op Note (Signed)
08/05/2014  9:42 AM  PATIENT:  Jennifer Hodges  69 y.o. female  PRE-OPERATIVE DIAGNOSIS:  OA  POST-OPERATIVE DIAGNOSIS:  OA   PROCEDURE:  Procedure(s): TOTAL HIP ARTHROPLASTY ANTERIOR APPROACH (Right)  SURGEON: Laurene Footman, MD  ASSISTANTS: None  ANESTHESIA:   spinal  EBL:  Total I/O In: 1400 [I.V.:1400] Out: 300 [Urine:100; Blood:200]  BLOOD ADMINISTERED:none  DRAINS: (2) Hemovact drain(s) in the Subcutaneous with  Suction Open   LOCAL MEDICATIONS USED:  MARCAINE     SPECIMEN:  Source of Specimen:  Right femoral head  DISPOSITION OF SPECIMEN:  PATHOLOGY  COUNTS:  YES  TOURNIQUET:  * No tourniquets in log *  IMPLANTS: Medacta 2 cemented stem AMIS, L femoral head with 50 mm versifit DM with matching liner  DICTATION: .Dragon Dictation   The patient was brought to the operating room and after spinal anesthesia was obtained the patient was placed on the operative table with the ipsilateral foot into the Medacta attachment, contralateral leg on a well-padded table. C-arm was brought in and preop template x-ray taken. After prepping and draping in usual sterile fashion appropriate patient identification and timeout procedures were completed. Anterior approach to the hip was obtained and centered over the greater trochanter and TFL muscle. The subcutaneous tissue was incised hemostasis being achieved by electrocautery. TFL fascia was incised and the muscle retracted laterally deep retractor placed. The lateral femoral circumflex vessels were identified and ligated. The anterior capsule was exposed and a capsulotomy performed. The neck was identified and a femoral neck cut carried out with a saw. The head was removed without difficulty and showed sclerotic femoral head and acetabulum. Reaming was carried out to 50 mm and a 50 mm cup trial gave appropriate tightness to the acetabular component a 50 cup was impacted into position. The leg was then externally rotated and  ischiofemoral and patellofemoral releases carried out. The femur was sequentially broached to a size 3.. The  2 cemented stem was inserted along with a L 28 mm head and 50 mm liner. The hip was reduced and was stable the wound was thoroughly irrigated with a dilute Betadine solution. The deep fascia view. Using a heavy Quill after infiltration of 30 cc of quarter percent Sensorcaine with epinephrine. Subcutaneous drains were then inserted to a Quill to close the skin with skin staples Xeroform 4 x 4's ABDs and tape patient was sent to recovery in stable condition complications none specimen removed femoral head issue comes stable. iMplants: Medacta cemented to AMIS stem with an L 28 mm head, liner and 50 DM cup   PLAN OF CARE: Admit to inpatient

## 2014-08-05 NOTE — Anesthesia Preprocedure Evaluation (Signed)
Anesthesia Evaluation  Patient identified by MRN, date of birth, ID band Patient awake    Reviewed: Allergy & Precautions, NPO status , Patient's Chart, lab work & pertinent test results  Airway Mallampati: II  TM Distance: >3 FB Neck ROM: Full    Dental  (+) Implants   Pulmonary asthma (last inhaler 1 yr ago) , sleep apnea and Continuous Positive Airway Pressure Ventilation , former smoker (quit x 7 yrs),          Cardiovascular + Valvular Problems/Murmurs (tricsupid regurg)     Neuro/Psych  Headaches,  Neuromuscular disease (s/p fall at 69 yrs old)    GI/Hepatic hiatal hernia, GERD-  Medicated and Poorly Controlled,  Endo/Other  diabetes (no meds)  Renal/GU      Musculoskeletal   Abdominal   Peds  Hematology   Anesthesia Other Findings   Reproductive/Obstetrics                             Anesthesia Physical Anesthesia Plan  ASA: III  Anesthesia Plan: Spinal   Post-op Pain Management:    Induction: Intravenous  Airway Management Planned:   Additional Equipment:   Intra-op Plan:   Post-operative Plan:   Informed Consent: I have reviewed the patients History and Physical, chart, labs and discussed the procedure including the risks, benefits and alternatives for the proposed anesthesia with the patient or authorized representative who has indicated his/her understanding and acceptance.     Plan Discussed with:   Anesthesia Plan Comments:         Anesthesia Quick Evaluation

## 2014-08-05 NOTE — Anesthesia Procedure Notes (Addendum)
Date/Time: 08/05/2014 7:21 AM Performed by: Nelda Marseille Pre-anesthesia Checklist: Patient identified, Emergency Drugs available, Suction available, Patient being monitored and Timeout performed Patient Re-evaluated:Patient Re-evaluated prior to inductionOxygen Delivery Method: Simple face mask    Spinal Patient location during procedure: OR Start time: 08/05/2014 7:59 AM End time: 08/05/2014 7:20 AM Staffing Resident/CRNA: Nelda Marseille Performed by: resident/CRNA  Preanesthetic Checklist Completed: patient identified, site marked, surgical consent, pre-op evaluation, timeout performed, IV checked, risks and benefits discussed and monitors and equipment checked Spinal Block Patient position: sitting Prep: Betadine Patient monitoring: heart rate, continuous pulse ox and blood pressure Approach: midline Location: L3-4 Injection technique: single-shot Needle Needle type: Whitacre  Needle gauge: 24 G Needle length: 9 cm Needle insertion depth: 4 cm Catheter type: closed end flexible Assessment Sensory level: T10 Additional Notes No parathesias, no blood return, + csf return.  Inject 52m 0.5% Marcaine with epi wash.  Needle and introducer out intact.  No complaints from patient.  Block successful.  Pt claimed in pre-op that she had chronic back pain that hurts her everyday.  She wanted spinal anesthesia in this case as she had spinal anesthesia for her L hip replacement in Feb and liked it and prefers it.

## 2014-08-05 NOTE — Transfer of Care (Signed)
Immediate Anesthesia Transfer of Care Note  Patient: Jennifer Hodges  Procedure(s) Performed: Procedure(s): TOTAL HIP ARTHROPLASTY ANTERIOR APPROACH (Right)  Patient Location: PACU  Anesthesia Type:Spinal  Level of Consciousness: sedated  Airway & Oxygen Therapy: Patient Spontanous Breathing and Patient connected to face mask oxygen  Post-op Assessment: Report given to RN  Post vital signs: stable  Last Vitals:  Filed Vitals:   08/05/14 0646  BP: 124/64  Pulse: 77  Temp: 36.7 C  Resp: 18    Complications: No apparent anesthesia complications

## 2014-08-06 ENCOUNTER — Encounter: Payer: Self-pay | Admitting: Orthopedic Surgery

## 2014-08-06 MED ORDER — OMEGA-3-ACID ETHYL ESTERS 1 G PO CAPS
1.0000 g | ORAL_CAPSULE | Freq: Every day | ORAL | Status: DC
  Administered 2014-08-07 – 2014-08-08 (×2): 1 g via ORAL
  Filled 2014-08-06 (×2): qty 1

## 2014-08-06 NOTE — Progress Notes (Signed)
Pt. Alert and oriented. VSS. Pain controlled with meds per MAR. Foley patent and will be d/c'd this morning. Pills whole with water. Pt. On CLD. No c/o nausea throughout the night. Will advance diet if pt. Tolerates breakfast. Ice pack to right hip. Neurochecks WDL. Rested quietly throughout the night.

## 2014-08-06 NOTE — Progress Notes (Signed)
   Subjective: 1 Day Post-Op Procedure(s) (LRB): TOTAL HIP ARTHROPLASTY ANTERIOR APPROACH (Right) Patient reports pain as 8 on 0-10 scale.   Patient is having problems with nausea/vomiting We will start therapy today.  Plan is to go home   Objective: Vital signs in last 24 hours: Temp:  [96.1 F (35.6 C)-99.2 F (37.3 C)] 99.2 F (37.3 C) (05/25 0340) Pulse Rate:  [53-119] 87 (05/25 0340) Resp:  [3-21] 18 (05/25 0340) BP: (99-156)/(52-91) 124/59 mmHg (05/25 0340) SpO2:  [92 %-100 %] 92 % (05/25 0340) Weight:  [81.647 kg (180 lb)] 81.647 kg (180 lb) (05/24 1210)  Intake/Output from previous day: 05/24 0701 - 05/25 0700 In: 2873 [P.O.:120; I.V.:2663] Out: 8675 [Urine:3225; Emesis/NG output:675; Drains:115; Blood:200] Intake/Output this shift:     Recent Labs  08/05/14 1224  HGB 10.6*    Recent Labs  08/05/14 1224  WBC 6.5  RBC 3.64*  HCT 32.4*  PLT 189    Recent Labs  08/05/14 1224  CREATININE 0.46   No results for input(s): LABPT, INR in the last 72 hours.  EXAM General - Patient is Alert, Appropriate and Oriented Extremity - Neurologically intact Neurovascular intact Sensation intact distally Intact pulses distally Dorsiflexion/Plantar flexion intact Dressing - dressing C/D/I Motor Function - intact, moving foot and toes well on exam.   Past Medical History  Diagnosis Date  . Hypercholesterolemia   . Bronchiectasis     mild  . Asthma   . GERD (gastroesophageal reflux disease)     EGD 8/09- non bleeding erosive gastritis, documentd esophageal ulcerations.   . Diverticulosis   . Hiatal hernia   . Sleep apnea     cpap asked to bring mask and tubing  . Osteoarthritis     lumbar disc disease, left hip  . Chronic headaches      followed by Headache [facility] migraines    Assessment/Plan:   1 Day Post-Op Procedure(s) (LRB): TOTAL HIP ARTHROPLASTY ANTERIOR APPROACH (Right) Active Problems:   Primary osteoarthritis of hip  Estimated body mass  index is 32.91 kg/(m^2) as calculated from the following:   Height as of 07/22/14: '5\' 2"'$  (1.575 m).   Weight as of this encounter: 81.647 kg (180 lb). Advance diet Up with therapy  Continue with current nausea medications  DVT Prophylaxis - Lovenox Weight-Bearing as tolerated to Right leg D/C O2 and Pulse OX and try on Room Air  T. Rachelle Hora, PA-C [place] 08/06/2014, 7:28 AM

## 2014-08-06 NOTE — Evaluation (Signed)
Occupational Therapy Evaluation Patient Details Name: Jennifer Hodges MRN: [id number] DOB: 07/19/1945 Today's Date: 08/06/2014    History of Present Illness Pt is a 69 y.o. female s/p R anterior THA 08/05/14.  Pt with post-op nausea/vomiting.   Clinical Impression   Pt is 69 year old female s/p R THA(anterior)  who lives at home with her husband. Pt was independent in all ADLs prior to surgery and is eager to return to PLOF.  Pt is currently limited in functional ADLs due to pain, decreased ROM, nausea and vomiting.  Pt requires minimal assist for LB dressing and bathing skills due to pain and decreased AROM of R LE  and would benefit from continued skilled OT services for education in assistive devices, functional mobility, and education in recommendations for home modifications to increase safety and prevent falls.  Pt has a transfer tub bench in place for bathing and bedside commode over toilet.  Pt most likely will not need any further OT services after discharge.  Pt given handout on where to purchase reacher and sock aid.     Follow Up Recommendations  No OT follow up    Equipment Recommendations   (pt has a transfer tub bench and BSC over toilet at home)    Recommendations for Other Services       Precautions / Restrictions Precautions Precautions: Fall Restrictions Weight Bearing Restrictions: Yes RLE Weight Bearing: Weight bearing as tolerated      Mobility Bed Mobility Overal bed mobility: Needs Assistance Bed Mobility: Supine to Sit     Supine to sit: Min assist;Mod assist     General bed mobility comments: assist for R LE and to scoot to edge of bed  Transfers Overall transfer level: Needs assistance Equipment used: Rolling walker (2 wheeled) Transfers: Sit to/from [facility] Sit to Stand: Min assist Stand pivot transfers: Min assist (transfer bed to commode)       General transfer comment: decreased stance time R LE    Balance                                             ADL                                         General ADL Comments: Pt able to complete LB dressing skills sitting EOB with reacher and sock aid with min assist and cues wtih no LOB and able to scoot self to edge and up in bed using rails to pull herself up.  She stated that she had a home made reacher using tongs and a shower ring  and a LH shoe horn to assist with dressing after last hip surgery and never saw an OT after hip surgery in Feb.     Vision     Perception     Praxis      Pertinent Vitals/Pain Pain Assessment: 0-10 Pain Score: 8  Pain Location: R hip  Pain Descriptors / Indicators: Sharp;Sore;Shooting Pain Intervention(s): Limited activity within patient's tolerance;Monitored during session;Premedicated before session;Repositioned     Hand Dominance Right   Extremity/Trunk Assessment Upper Extremity Assessment Upper Extremity Assessment: Overall WFL for tasks assessed   Lower Extremity Assessment Lower Extremity Assessment: Defer to PT evaluation RLE Deficits / Details: R  hip flexion at least 2+/5 d/t pain; R knee flexion and extension at least 3+/5 d/t pain; R DF at least 4/5 RLE: Unable to fully assess due to pain       Communication Communication Communication: No difficulties   Cognition Arousal/Alertness: Awake/alert Behavior During Therapy: WFL for tasks assessed/performed Overall Cognitive Status: Within Functional Limits for tasks assessed                     General Comments       Exercises       Shoulder Instructions      Home Living Family/patient expects to be discharged to:: Private residence Living Arrangements: Spouse/significant other Available Help at Discharge: Family   Home Access: Stairs to enter Technical brewer of Steps: 2 Entrance Stairs-Rails:  (holds onto [employer]) Home Layout: One level     Bathroom Shower/Tub: Tub/shower  unit Shower/tub characteristics: Architectural technologist: Standard Bathroom Accessibility: No (pt states she has to walk in sideways with walker due to narrow door to bathroom)   Home Equipment: Walker - 2 wheels;Cane - single point;Bedside commode;Shower seat (home made reacher using tongs and shower curtain ring and used LH shoe horn as dressing aide)          Prior Functioning/Environment Level of Independence: Independent             OT Diagnosis: Generalized weakness;Acute pain   OT Problem List: Decreased strength;Decreased range of motion;Decreased activity tolerance;Pain;Decreased knowledge of use of DME or AE   OT Treatment/Interventions: Self-care/ADL training;DME and/or AE instruction    OT Goals(Current goals can be found in the care plan section) Acute Rehab OT Goals Patient Stated Goal: To go home OT Goal Formulation: With patient Time For Goal Achievement: 08/20/14 Potential to Achieve Goals: Good  OT Frequency: Min 1X/week   Barriers to D/C:            Co-evaluation              End of Session Equipment Utilized During Treatment:  (reacher and sock aid)  Activity Tolerance: Patient limited by pain Patient left: in bed;with call bell/phone within reach;with bed alarm set   Time: 8478-4128 OT Time Calculation (min): 33 min Charges:  OT General Charges $OT Visit: 1 Procedure OT Evaluation $Initial OT Evaluation Tier I: 1 Procedure OT Treatments $Self Care/Home Management : 23-37 mins G-Codes:    [name] Aug 23, 2014, 11:28 AM    Chrys Racer, OTR/L ascom [phone]

## 2014-08-06 NOTE — Care Management Note (Signed)
Case Management Note  Patient Details  Name: Jennifer Hodges MRN: [id number] Date of Birth: 1945-06-23  Subjective/Objective:  POD 1 following a right total hip arthroplasty. RNCM met with pt and role explained. Pt lives at home with spouse. PT recommends home with home health PT. Pt prefers [name]. Referral sent to [facility] for home PT. Patient reports she has all needed DME, including a walker from a previous surgery. She uses [facility], [place] [phone]. Prescription called in per MD order for pricing :Lovenox/enoxaparin 40 mg SQ QD x 2 weeks, no refills.              Action/Plan:   Expected Discharge Date:                  Expected Discharge Plan:  [place]  In-House Referral:     Discharge planning Services  CM Consult  Post Acute Care Choice:  Home Health Choice offered to:  Patient  DME Arranged:    DME Agency:     HH Arranged:  PT HH Agency:  [place]  Status of Service:  In process, will continue to follow  Medicare Important Message Given:  Yes Date Medicare IM Given:  08/06/14 Medicare IM give by:  Orvan July Date Additional Medicare IM Given:    Additional Medicare Important Message give by:     If discussed at [place] of Stay Meetings, dates discussed:    Additional Comments:  Jolly Mango, RN 08/06/2014, 10:56 AM

## 2014-08-06 NOTE — Evaluation (Signed)
Physical Therapy Evaluation Patient Details Name: Jennifer Hodges MRN: [id number] DOB: 08-20-1945 Today's Date: 08/06/2014   History of Present Illness  Pt is a 69 y.o. female s/p R anterior THA 08/05/14.  Pt with post-op nausea/vomiting.  Clinical Impression  Currently pt demonstrates impairments with pain, R LE strength, balance, and limitations with functional mobility.  Prior to admission, pt was independent ambulating without AD (of note, pt with h/o L anterior THA February 2016).  Pt lives with her husband in 1 level home with 2 steps to enter with banister to hold onto.  Currently pt is min to mod assist supine to sit and min assist with transfers and ambulation 10 feet with RW.  Pt would benefit from skilled PT to address above noted impairments and functional limitations.  Recommend pt discharge to home with HHPT and assist of husband as needed when medically appropriate.     Follow Up Recommendations Home health PT;Supervision - Intermittent    Equipment Recommendations       Recommendations for Other Services       Precautions / Restrictions Precautions Precautions: Fall Restrictions Weight Bearing Restrictions: Yes RLE Weight Bearing: Weight bearing as tolerated  R anterior hip precautions     Mobility  Bed Mobility Overal bed mobility: Needs Assistance Bed Mobility: Supine to Sit     Supine to sit: Min assist;Mod assist     General bed mobility comments: assist for R LE and to scoot to edge of bed  Transfers Overall transfer level: Needs assistance Equipment used: Rolling walker (2 wheeled) Transfers: Sit to/from [employer] Sit to Stand: Min assist Stand pivot transfers: Min assist (transfer bed to commode)       General transfer comment: decreased stance time R LE  Ambulation/Gait Ambulation/Gait assistance: Min assist Ambulation Distance (Feet): 10 Feet Assistive device: Rolling walker (2 wheeled)       General Gait Details:  antalgic; decreased stance time R LE  Stairs            Wheelchair Mobility    Modified Rankin (Stroke Patients Only)       Balance                                             Pertinent Vitals/Pain Pain Assessment: 0-10 Pain Score: 9  Pain Location: R hip pain Pain Descriptors / Indicators: Sharp;Shooting Pain Intervention(s): Limited activity within patient's tolerance;Monitored during session;Repositioned;RN gave pain meds during session  Vitals: at rest HR 94 bpm; O2 90% on room air; after activity HR 86 bpm; O2 92% on room air.     Home Living Family/patient expects to be discharged to:: Private residence Living Arrangements: Spouse/significant other Available Help at Discharge: Family   Home Access: Stairs to enter Entrance Stairs-Rails:  (holds onto [employer]) Technical brewer of Steps: 2 Home Layout: One level Home Equipment: Environmental consultant - 2 wheels;Cane - single point;Bedside commode;Shower seat      Prior Function Level of Independence: Independent               Hand Dominance        Extremity/Trunk Assessment   Upper Extremity Assessment: Overall WFL for tasks assessed           Lower Extremity Assessment: RLE deficits/detail (L LE WFL) RLE Deficits / Details: R hip flexion at least 2+/5 d/t pain; R knee flexion and  extension at least 3+/5 d/t pain; R DF at least 4/5       Communication   Communication: No difficulties  Cognition Arousal/Alertness: Awake/alert Behavior During Therapy: WFL for tasks assessed/performed Overall Cognitive Status: Within Functional Limits for tasks assessed                      General Comments  Pt requesting to go to the bathroom as soon as possible (foley already removed).  Pt agreeable to PT session.  Pt reports R knee has ongoing gout issues so it is painful (limiting ex's performed during session) and had post-op nausea/vomiting (pt reports d/t pain medications she was  given).  Pt also reports h/o back surgeries and back pain with certain positions/activities.    Exercises   Treatment:  Performed sitting exercises x 10 reps B LE's:  LAQ's (AROM R; AROM L); marching/hip flexion (AAROM R; AROM L).  Also performed semi-supine x10 B LE ankle pumps and glute squeezes (x3 second holds).  Pt required vc's and tactile cues for correct technique with exercises.       Assessment/Plan    PT Assessment Patient needs continued PT services  PT Diagnosis Difficulty walking;Acute pain   PT Problem List Decreased strength;Decreased activity tolerance;Decreased balance;Decreased mobility;Decreased knowledge of use of DME;Decreased knowledge of precautions;Pain  PT Treatment Interventions DME instruction;Gait training;Stair training;Functional mobility training;Therapeutic activities;Therapeutic exercise;Balance training;Patient/family education   PT Goals (Current goals can be found in the Care Plan section) Acute Rehab PT Goals Patient Stated Goal: To go home PT Goal Formulation: With patient Time For Goal Achievement: 08/20/14 Potential to Achieve Goals: Good    Frequency BID   Barriers to discharge        Co-evaluation               End of Session Equipment Utilized During Treatment: Gait belt Activity Tolerance: Patient limited by pain Patient left: in chair;with call bell/phone within reach;with chair alarm set;with nursing/sitter in room;with SCD's reapplied (B heels elevated via towel roll)           Time: 2902-1115 PT Time Calculation (min) (ACUTE ONLY): 25 min   Charges:   PT Evaluation $Initial PT Evaluation Tier I: 1 Procedure PT Treatments $Therapeutic Exercise: 8-22 mins   PT G CodesLeitha Bleak 14-Aug-2014, 9:26 AM Leitha Bleak, [place]

## 2014-08-06 NOTE — Progress Notes (Signed)
Physical Therapy Treatment Patient Details Name: Jennifer Hodges MRN: [id number] DOB: 1945/07/28 Today's Date: 08/06/2014    History of Present Illness Pt is a 69 y.o. female s/p R anterior THA 08/05/14.  Pt with post-op nausea/vomiting.    PT Comments    Pt progressing with ambulation distance with RW this afternoon.  Will continue to progress pt per pt tolerance.  Anticipate with continued progress, pt will be able to discharge home with support of her husband.  Follow Up Recommendations  Home health PT;Supervision - Intermittent     Equipment Recommendations       Recommendations for Other Services       Precautions / Restrictions Precautions Precautions: Fall;Anterior Hip Restrictions Weight Bearing Restrictions: Yes RLE Weight Bearing: Weight bearing as tolerated    Mobility  Bed Mobility Overal bed mobility: Needs Assistance Bed Mobility: Sit to Supine       Sit to supine: Min assist   General bed mobility comments: for R LE  Transfers Overall transfer level: Needs assistance Equipment used: Rolling walker (2 wheeled) Transfers: Sit to/from [facility] Sit to Stand: Min guard Stand pivot transfers: Min guard       General transfer comment: decreased stance time R LE  Ambulation/Gait Ambulation/Gait assistance: Min guard Ambulation Distance (Feet): 40 Feet Assistive device: Rolling walker (2 wheeled)       General Gait Details: antalgic; decreased stance time R LE   Stairs            Wheelchair Mobility    Modified Rankin (Stroke Patients Only)       Balance                                    Cognition Arousal/Alertness: Awake/alert Behavior During Therapy: WFL for tasks assessed/performed Overall Cognitive Status: Within Functional Limits for tasks assessed                      Exercises  Performed x10 B LE semi-supine therapeutic exercise:  Quad sets with 3 second holds (AROM R; AROM L);  hip Adduction isometrics x3 seconds (pillow between thighs) (AROM B LE's); ankle pumps (AROM B LE's); gluteal sets x3 second holds (AROM B); heelslides (AAROM R; AROM L); SAQ's (AAROM R; AROM L); hip abduction/adduction (AAROM R; AROM L).  Pt required vc's and tactile cues for correct technique with ex's.    General Comments  Pt agreeable to PT session.      Pertinent Vitals/Pain Pain Assessment: 0-10 Pain Score: 8  Pain Location: R hip Pain Descriptors / Indicators: Sharp;Shooting;Sore Pain Intervention(s): Limited activity within patient's tolerance;Monitored during session;Repositioned  Vitals: at rest HR 87 bpm; O2 93% on room air; after activity HR 91 bpm; O2 92% on room air.     Home Living Family/patient expects to be discharged to:: Private residence Living Arrangements: Spouse/significant other Available Help at Discharge: Family   Home Access: Stairs to enter   Home Layout: One level Home Equipment: Environmental consultant - 2 wheels;Cane - single point;Bedside commode;Shower seat (home made reacher using tongs and shower curtain ring and used LH shoe horn as dressing aide)      Prior Function Level of Independence: Independent          PT Goals (current goals can now be found in the care plan section) Acute Rehab PT Goals Patient Stated Goal: To go home PT Goal Formulation: With patient  Time For Goal Achievement: 08/20/14 Potential to Achieve Goals: Good Progress towards PT goals: Progressing toward goals    Frequency  BID    PT Plan Current plan remains appropriate    Co-evaluation             End of Session Equipment Utilized During Treatment: Gait belt Activity Tolerance: Patient limited by pain Patient left: in bed;with call bell/phone within reach;with bed alarm set;with SCD's reapplied (B heels elevated via pillow)     Time: 1355-1419 PT Time Calculation (min) (ACUTE ONLY): 24 min  Charges:  $Gait Training: 8-22 mins $Therapeutic Exercise: 8-22 mins                     G CodesLeitha Bleak 08-07-2014, 2:34 PM Leitha Bleak, [place]

## 2014-08-06 NOTE — Progress Notes (Signed)
Clinical Social Worker (CSW) received SNF consult. PT is recommending home health. RN Case Manager aware of above. Please reconsult if future social work needs arise. CSW signing off.   Hiliary Osorto Morgan, LCSWA [phone] 

## 2014-08-07 ENCOUNTER — Telehealth: Payer: Self-pay | Admitting: Internal Medicine

## 2014-08-07 LAB — SURGICAL PATHOLOGY

## 2014-08-07 MED ORDER — RIVAROXABAN 10 MG PO TABS
10.0000 mg | ORAL_TABLET | Freq: Every day | ORAL | Status: DC
  Administered 2014-08-08: 10 mg via ORAL
  Filled 2014-08-07: qty 1

## 2014-08-07 NOTE — Telephone Encounter (Signed)
Noted. Thanks.

## 2014-08-07 NOTE — Telephone Encounter (Signed)
I spoke with patient & could not find any documentation that anyone tried to call her, She was [place] check another office that may have tried to call her also.

## 2014-08-07 NOTE — Care Management Note (Signed)
Case Management Note  Patient Details  Name: Jennifer Hodges MRN: [id number] Date of Birth: 19-Nov-1945  Subjective/Objective:   Patient requesting home with xarelto.  Updated Rachelle Hora, PA. He will discuss with Dr. Rudene Christians and speak further with patient.                Action/Plan: It is anticipated patient will discharge tomorrow.   Expected Discharge Date:   08/07/2014               Expected Discharge Plan:  [place]  In-House Referral:     Discharge planning Services  CM Consult  Post Acute Care Choice:  Home Health Choice offered to:  Patient  DME Arranged:    DME Agency:     HH Arranged:  PT HH Agency:  [facility]  Status of Service:  In process, will continue to follow  Medicare Important Message Given:  Yes Date Medicare IM Given:  08/06/14 Medicare IM give by:  Orvan July Date Additional Medicare IM Given:    Additional Medicare Important Message give by:     If discussed at [place] of Stay Meetings, dates discussed:    Additional Comments:  Jolly Mango, RN 08/07/2014, 10:18 AM

## 2014-08-07 NOTE — Telephone Encounter (Signed)
Pt called from her hospital room stating that her husband told her that our office called her. When he told her the number to call back she realized it wasn't the correct number and is wanting to know if Dr. Nicki Reaper did try to get a hold of her. She is in room #104 at [facility] 08/07/14 maf

## 2014-08-07 NOTE — Progress Notes (Signed)
   Subjective: 2 Days Post-Op Procedure(s) (LRB): TOTAL HIP ARTHROPLASTY ANTERIOR APPROACH (Right) Patient reports pain as mild Patient is not having any problems or complaints. N/V resolved We will continue therapy today.  Plan is to go home   Objective: Vital signs in last 24 hours: Temp:  [98.2 F (36.8 C)-100.1 F (37.8 C)] 98.2 F (36.8 C) (05/26 0727) Pulse Rate:  [82-102] 102 (05/26 0727) Resp:  [17-19] 18 (05/26 0727) BP: (128-152)/(59-71) 138/67 mmHg (05/26 0727) SpO2:  [87 %-94 %] 92 % (05/26 0727)  Intake/Output from previous day: 05/25 0701 - 05/26 0700 In: 2006.7 [P.O.:240; I.V.:1766.7] Out: 1675 [Urine:1625; Drains:50] Intake/Output this shift:     Recent Labs  08/05/14 1224  HGB 10.6*    Recent Labs  08/05/14 1224  WBC 6.5  RBC 3.64*  HCT 32.4*  PLT 189    Recent Labs  08/05/14 1224  CREATININE 0.46   No results for input(s): LABPT, INR in the last 72 hours.  EXAM General - Patient is Alert, Appropriate and Oriented Extremity - Neurologically intact Neurovascular intact Sensation intact distally Intact pulses distally Dorsiflexion/Plantar flexion intact Dressing - dressing C/D/I, dressing changed to honeycomb. Hemovac removed Motor Function - intact, moving foot and toes well on exam.   Past Medical History  Diagnosis Date  . Hypercholesterolemia   . Bronchiectasis     mild  . Asthma   . GERD (gastroesophageal reflux disease)     EGD 8/09- non bleeding erosive gastritis, documentd esophageal ulcerations.   . Diverticulosis   . Hiatal hernia   . Sleep apnea     cpap asked to bring mask and tubing  . Osteoarthritis     lumbar disc disease, left hip  . Chronic headaches      followed by [facility] migraines    Assessment/Plan:   2 Days Post-Op Procedure(s) (LRB): TOTAL HIP ARTHROPLASTY ANTERIOR APPROACH (Right) Active Problems:   Primary osteoarthritis of hip  Estimated body mass index is 32.91 kg/(m^2) as calculated  from the following:   Height as of this encounter: '5\' 2"'$  (1.575 m).   Weight as of this encounter: 81.647 kg (180 lb). Advance diet Up with therapy  Plan on discharge to home with home health tomorrow  DVT Prophylaxis - Lovenox Weight-Bearing as tolerated to Right leg D/C O2 and Pulse OX and try on Room Air  T. Rachelle Hora, PA-C [place] 08/07/2014, 7:38 AM

## 2014-08-07 NOTE — Progress Notes (Signed)
Physical Therapy Treatment Patient Details Name: Jennifer Hodges MRN: [id number] DOB: 09-06-1945 Today's Date: 08/07/2014    History of Present Illness Pt is a 69 y.o. female s/p R anterior THA 08/05/14.    PT Comments    Pt progressing well with functional mobility and SBA with ambulation 240 feet with RW this AM.  Plan to trial stairs this PM.  Follow Up Recommendations  Home health PT;Supervision - Intermittent     Equipment Recommendations       Recommendations for Other Services       Precautions / Restrictions Precautions Precautions: Fall;Anterior Hip Restrictions Weight Bearing Restrictions: Yes RLE Weight Bearing: Weight bearing as tolerated    Mobility  Bed Mobility                  Transfers Overall transfer level: Needs assistance Equipment used: Rolling walker (2 wheeled) Transfers: Sit to/from [employer] Transfers Sit to Stand: Supervision Stand pivot transfers: Supervision (transfer to commode over toilet)       General transfer comment: decreased stance time R LE  Ambulation/Gait Ambulation/Gait assistance: Supervision Ambulation Distance (Feet): 240 Feet (20 feet to bathroom) Assistive device: Rolling walker (2 wheeled)       General Gait Details: antalgic; decreased stance time R LE   Stairs            Wheelchair Mobility    Modified Rankin (Stroke Patients Only)       Balance                                    Cognition Arousal/Alertness: Awake/alert Behavior During Therapy: WFL for tasks assessed/performed Overall Cognitive Status: Within Functional Limits for tasks assessed                      Exercises   Performed sitting exercises x 15 reps B LE's:  Heel/toe raises (AROM R; AROM L); LAQ's (AROM R; AROM L); marching/hip flexion (AAROM R; AROM L); also performed semi supine B LE x15 quad sets and glute squeezes x3 second holds (AROM B).  Pt required vc's and tactile cues for  correct technique with exercises.     General Comments  Pt agreeable to PT and requesting to toilet prior to ambulation.  Nursing cleared pt for participation in PT.      Pertinent Vitals/Pain Pain Assessment: 0-10 Pain Score: 7  Pain Location: R hip Pain Descriptors / Indicators: Aching;Sore;Tender Pain Intervention(s): Limited activity within patient's tolerance;Monitored during session;Premedicated before session;Repositioned    Home Living                      Prior Function            PT Goals (current goals can now be found in the care plan section) Acute Rehab PT Goals Patient Stated Goal: To go home PT Goal Formulation: With patient Time For Goal Achievement: 08/20/14 Potential to Achieve Goals: Good Progress towards PT goals: Progressing toward goals    Frequency  BID    PT Plan Current plan remains appropriate    Co-evaluation             End of Session Equipment Utilized During Treatment: Gait belt Activity Tolerance: Patient tolerated treatment well Patient left: in chair;with call bell/phone within reach;with chair alarm set;with SCD's reapplied (B heels elevated via pillow)     Time: 1010-1034 PT  Time Calculation (min) (ACUTE ONLY): 24 min  Charges:  $Gait Training: 8-22 mins $Therapeutic Exercise: 8-22 mins                    G CodesLeitha Bleak 2014-08-30, 10:45 AM Leitha Bleak, [place]

## 2014-08-07 NOTE — Progress Notes (Signed)
Physical Therapy Treatment Patient Details Name: Jennifer Hodges MRN: [id number] DOB: [birth year] Today's Date: 08/07/2014    History of Present Illness Pt is a 69 y.o. female s/p R anterior THA 08/05/14.    PT Comments    Pt continues to progress well with physical therapy and was able to navigate stairs (CGA) and ambulate 240 feet with RW (SBA) this afternoon.  Anticipate with continued progress pt will be able to discharge home with support of husband, use of RW, and HHPT.  Follow Up Recommendations  Home health PT;Supervision - Intermittent     Equipment Recommendations       Recommendations for Other Services       Precautions / Restrictions Precautions Precautions: Fall;Anterior Hip Restrictions Weight Bearing Restrictions: Yes RLE Weight Bearing: Weight bearing as tolerated    Mobility  Bed Mobility                  Transfers Overall transfer level: Needs assistance Equipment used: Rolling walker (2 wheeled) Transfers: Sit to/from [employer] Transfers Sit to Stand: Supervision Stand pivot transfers: Supervision       General transfer comment: decreased stance time R LE  Ambulation/Gait Ambulation/Gait assistance: Supervision Ambulation Distance (Feet): 240 Feet (plus 50 feet) Assistive device: Rolling walker (2 wheeled)       General Gait Details: antalgic; decreased stance time R LE; pt initially with difficulty advancing R LE/clearing floor with foot but improved with vc's and repetition; decreased cadence but steady   Stairs Stairs: Yes Stairs assistance: Min guard Stair Management: Two rails Number of Stairs: 4 General stair comments: step to; simulated rails like holding onto banisters at home  Wheelchair Mobility    Modified Rankin (Stroke Patients Only)       Balance                                    Cognition Arousal/Alertness: Awake/alert Behavior During Therapy: WFL for tasks  assessed/performed Overall Cognitive Status: Within Functional Limits for tasks assessed                      Exercises      General Comments  Pt reports only taking one tablet for pain medication prior to therapy so she would not be too sleepy for therapy session.  Pt agreeable to PT session.      Pertinent Vitals/Pain Pain Assessment: 0-10 Pain Score: 8  Pain Location: R hip Pain Descriptors / Indicators: Aching;Sore;Tender Pain Intervention(s): Limited activity within patient's tolerance;Monitored during session;Premedicated before session;Repositioned;Patient requesting pain meds-RN notified  Vitals: at rest HR 102 bpm; O2 91% on room air; after activity HR 110 bpm; O2 95% on room air.     Home Living                      Prior Function            PT Goals (current goals can now be found in the care plan section) Acute Rehab PT Goals Patient Stated Goal: To go home PT Goal Formulation: With patient Time For Goal Achievement: 08/20/14 Potential to Achieve Goals: Good Progress towards PT goals: Progressing toward goals    Frequency  BID    PT Plan Current plan remains appropriate    Co-evaluation             End of Session Equipment Utilized During Treatment:  Gait belt Activity Tolerance: Patient tolerated treatment well Patient left: in chair;with call bell/phone within reach;with chair alarm set;with SCD's reapplied (B heels elevated via pillow)     Time: 1323-1350 PT Time Calculation (min) (ACUTE ONLY): 27 min  Charges:  $Gait Training: 23-37 mins                    G CodesLeitha Bleak 08/09/14, 2:03 PM Leitha Bleak, [place]

## 2014-08-07 NOTE — Progress Notes (Signed)
Patient tolerating po pain medication.  Ambulates one assistance with minimal assistance.  Plan to discharge home tomorrow with [facility] HHPT.  Met goals of positive bowel movement and voiding.

## 2014-08-08 MED ORDER — ONDANSETRON HCL 4 MG PO TABS: 4.0000 mg | ORAL_TABLET | Freq: Four times a day (QID) | ORAL | Status: DC | PRN

## 2014-08-08 MED ORDER — RIVAROXABAN 10 MG PO TABS: 10.0000 mg | ORAL_TABLET | Freq: Every day | ORAL | Status: DC

## 2014-08-08 MED ORDER — OXYCODONE HCL 5 MG PO TABS: 5.0000 mg | ORAL_TABLET | ORAL | Status: DC | PRN

## 2014-08-08 NOTE — Progress Notes (Signed)
   Subjective: 3 Days Post-Op Procedure(s) (LRB): TOTAL HIP ARTHROPLASTY ANTERIOR APPROACH (Right) Patient reports pain as mild Patient is not having any problems or complaints. N/V resolved We will continue therapy today.  Plan is to go home today with HHPT  Objective: Vital signs in last 24 hours: Temp:  [98 F (36.7 C)-98.8 F (37.1 C)] 98 F (36.7 C) (05/26 2040) Pulse Rate:  [81-102] 81 (05/26 2040) Resp:  [18] 18 (05/26 2040) BP: (108-138)/(55-67) 108/55 mmHg (05/26 2040) SpO2:  [92 %-97 %] 96 % (05/26 2040)  Intake/Output from previous day: 05/26 0701 - 05/27 0700 In: 600 [P.O.:600] Out: 200 [Urine:200] Intake/Output this shift:     Recent Labs  08/05/14 1224  HGB 10.6*    Recent Labs  08/05/14 1224  WBC 6.5  RBC 3.64*  HCT 32.4*  PLT 189    Recent Labs  08/05/14 1224  CREATININE 0.46   No results for input(s): LABPT, INR in the last 72 hours.  EXAM General - Patient is Alert, Appropriate and Oriented Extremity - Neurologically intact Neurovascular intact Sensation intact distally Intact pulses distally Dorsiflexion/Plantar flexion intact Dressing - dressing C/D/I,  Motor Function - intact, moving foot and toes well on exam.   Past Medical History  Diagnosis Date  . Hypercholesterolemia   . Bronchiectasis     mild  . Asthma   . GERD (gastroesophageal reflux disease)     EGD 8/09- non bleeding erosive gastritis, documentd esophageal ulcerations.   . Diverticulosis   . Hiatal hernia   . Sleep apnea     cpap asked to bring mask and tubing  . Osteoarthritis     lumbar disc disease, left hip  . Chronic headaches      followed by [facility] migraines    Assessment/Plan:   3 Days Post-Op Procedure(s) (LRB): TOTAL HIP ARTHROPLASTY ANTERIOR APPROACH (Right) Active Problems:   Primary osteoarthritis of hip  Estimated body mass index is 32.91 kg/(m^2) as calculated from the following:   Height as of this encounter: '5\' 2"'$  (1.575  m).   Weight as of this encounter: 81.647 kg (180 lb). Advance diet Up with therapy  Plan on discharge to home with home health PT today  DVT Prophylaxis - xarelto Weight-Bearing as tolerated to Right leg D/C O2 and Pulse OX and try on Room Air  T. Rachelle Hora, PA-C [place] 08/08/2014, 7:22 AM

## 2014-08-08 NOTE — Progress Notes (Signed)
Patient ambulates with walker one assist supervision. Pain controlled with Oxycodone. Patient complaints of nausea/vomitting giving Zofran. VSS.

## 2014-08-08 NOTE — Progress Notes (Signed)
Physical Therapy Treatment Patient Details Name: Jennifer Hodges MRN: [id number] DOB: September 04, 1945 Today's Date: 08/08/2014    History of Present Illness Pt is a 69 y.o. female s/p R anterior THA 08/05/14.    PT Comments    Pt modified independent with transfers and gait with RW and supervision with stairs.  Pt's husband present for entire session and both pt and pt's husband report no questions or concerns for discharge home.  Pt appears safe to discharge home with RW and intermittent assist from husband (assist for stairs) and HHPT.  Follow Up Recommendations  Home health PT;Supervision - Intermittent (assist for stairs)     Equipment Recommendations       Recommendations for Other Services       Precautions / Restrictions Precautions Precautions: Fall;Anterior Hip Restrictions Weight Bearing Restrictions: No RLE Weight Bearing: Weight bearing as tolerated    Mobility  Bed Mobility               General bed mobility comments: Pt refused to get in/out of hospital bed (d/t hospital bed being too painful/uncomfortable; pt reports home bed is much more comfortable); pt reports husband can assist her as needed with bed mobility.  Transfers Overall transfer level: Modified independent Equipment used: Rolling walker (2 wheeled) Transfers: Sit to/from [facility] Sit to Stand: Modified independent (Device/Increase time) Stand pivot transfers: Modified independent (Device/Increase time)          Ambulation/Gait Ambulation/Gait assistance: Modified independent (Device/Increase time) Ambulation Distance (Feet): 260 Feet Assistive device: Rolling walker (2 wheeled)       General Gait Details: antalgic but steady   Stairs Stairs: Yes Stairs assistance: Supervision Stair Management: Two rails Number of Stairs: 4 General stair comments: step to; simulated rails like holding onto banisters at home  Wheelchair Mobility    Modified Rankin (Stroke  Patients Only)       Balance                                    Cognition Arousal/Alertness: Awake/alert Behavior During Therapy: WFL for tasks assessed/performed Overall Cognitive Status: Within Functional Limits for tasks assessed                      Exercises   Performed x10 B LE semi-supine therapeutic exercise:  Quad sets with 3 second holds (AROM R; AROM L); hip Adduction isometrics x3 seconds (pillow between thighs) (AROM B LE's); gluteal sets x3 second holds (AROM B); heelslides (AAROM R; AROM L); SAQ's (AROM R; AROM L); hip abduction/adduction (AAROM R; AROM L); also performed sitting B LE hip flexion (AAROM R; AROM L) x10 reps.  Pt required vc's and tactile cues for correct technique with ex's.     General Comments  Pt's husband present for entire session.  Pt agreeable to PT session and ready to discharge home.      Pertinent Vitals/Pain Pain Assessment: 0-10 Pain Score: 6  Pain Location: R hip Pain Descriptors / Indicators: Aching;Sore;Tender Pain Intervention(s): Limited activity within patient's tolerance;Monitored during session;Premedicated before session;Repositioned  Vitals stable and WFL throughout treatment session.     Home Living                      Prior Function            PT Goals (current goals can now be found in the care plan  section) Acute Rehab PT Goals Patient Stated Goal: To go home PT Goal Formulation: With patient Time For Goal Achievement: 08/20/14 Potential to Achieve Goals: Good Progress towards PT goals: Goals met/education completed, patient discharged from PT    Frequency  BID    PT Plan Current plan remains appropriate    Co-evaluation             End of Session Equipment Utilized During Treatment: Gait belt Activity Tolerance: Patient tolerated treatment well Patient left: in chair;with call bell/phone within reach;with family/visitor present     Time: 0920-0944 PT Time  Calculation (min) (ACUTE ONLY): 24 min  Charges:  $Gait Training: 8-22 mins $Therapeutic Exercise: 8-22 mins                    G CodesLeitha Bleak 08-22-14, 11:08 AM Leitha Bleak, [place]

## 2014-08-08 NOTE — Discharge Summary (Signed)
Physician Discharge Summary  Patient ID: Jennifer Hodges MRN: [id number] DOB/AGE: [birth year] 69 y.o.  Admit date: 08/05/2014 Discharge date: 08/08/2014  Admission Diagnoses:  OA   Discharge Diagnoses: Patient Active Problem List   Diagnosis Date Noted  . Primary osteoarthritis of hip 08/05/2014  . Heel pain 06/01/2014  . Diarrhea 06/01/2014  . Health care maintenance 06/01/2014  . Right leg pain 03/30/2014  . Right arm pain 03/30/2014  . Sinusitis 03/30/2014  . Internal nasal lesion 11/12/2013  . Rib pain 08/04/2013  . Elevated AFP 04/28/2013  . Osteoporosis 11/12/2012  . Nonspecific (abnormal) findings on radiological and other examination of gastrointestinal tract 05/31/2012  . Bronchiectasis 02/05/2012  . Sleep apnea 02/05/2012  . Degenerative disc disease, cervical 02/05/2012  . Hypercholesterolemia 02/05/2012  . GERD (gastroesophageal reflux disease) 02/05/2012  . Chronic headaches 02/05/2012    Past Medical History  Diagnosis Date  . Hypercholesterolemia   . Bronchiectasis     mild  . Asthma   . GERD (gastroesophageal reflux disease)     EGD 8/09- non bleeding erosive gastritis, documentd esophageal ulcerations.   . Diverticulosis   . Hiatal hernia   . Sleep apnea     cpap asked to bring mask and tubing  . Osteoarthritis     lumbar disc disease, left hip  . Chronic headaches      followed by [facility] migraines     Transfusion: None   Consultants (if any):   none  Discharged Condition: Improved  Hospital Course: Jennifer Hodges is an 69 y.o. female who was admitted 08/05/2014 with a diagnosis of right hip osteoarthritis and went to the operating room on 08/05/2014 and underwent the above named procedures. Patient tolerated the procedure well. She was brought to the orthopedic floor for the PACU in stable condition. On postop day 1 patient was having moderate nausea and moderate pain. Pain was improved with oxycodone and nausea was improved with  Zofran. On postop day 2 and 3 patient made Progress in physical therapy. She had no nausea or vomiting. Pain was well controlled. She was stable and ready for discharge to home with home health physical therapy by postoperative day 3.    Surgeries: Procedure(s): TOTAL HIP ARTHROPLASTY ANTERIOR APPROACH on 08/05/2014 Patient tolerated the surgery well. Taken to PACU where she was stabilized and then transferred to the orthopedic floor.  Started on Lovenox  30 mg every 12 hours Foot pumps applied bilaterally at 80 mm. Heels elevated on bed with rolled towels. No evidence of DVT. Negative Homan. Physical therapy started on day #1 for gait training and transfer. OT started day #1 for ADL and assisted devices.  Patient's IV , foley and hemovac was d/c on day #1.  On postop day 3 patient was switched from Lovenox to xarelto at patient's request  Implants: IMPLANTS: Medacta 2 cemented stem AMIS, L femoral head with 50 mm versifit DM with matching liner   She was given perioperative antibiotics:  Anti-infectives    Start     Dose/Rate Route Frequency Ordered Stop   08/05/14 1200  ceFAZolin (ANCEF) IVPB 1 g/50 mL premix     1 g 100 mL/hr over 30 Minutes Intravenous Every 6 hours 08/05/14 1130 08/06/14 0559   08/05/14 1145  terbinafine (LAMISIL) tablet 250 mg     250 mg Oral BH-each morning 08/05/14 1130     08/05/14 0615  ceFAZolin (ANCEF) IVPB 2 g/50 mL premix  Status:  Discontinued     2 g  100 mL/hr over 30 Minutes Intravenous  Once 08/05/14 0607 08/05/14 1128   08/05/14 0600  ceFAZolin (ANCEF) 2-3 GM-% IVPB SOLR    Comments:  LEWIS, CINDY: cabinet override      08/05/14 0600 08/05/14 0724    .  She was given sequential compression devices, early ambulation, and Lovenox followed by Xarelto for DVT prophylaxis.  She benefited maximally from the hospital stay and there were no complications.    Recent vital signs:  Filed Vitals:   08/07/14 2040  BP: 108/55  Pulse: 81  Temp: 98 F  (36.7 C)  Resp: 18    Recent laboratory studies:  Lab Results  Component Value Date   HGB 10.6* 08/05/2014   HGB 12.3 07/22/2014   HGB 12.3 06/02/2014   Lab Results  Component Value Date   WBC 6.5 08/05/2014   PLT 189 08/05/2014   Lab Results  Component Value Date   INR 0.92 07/22/2014   Lab Results  Component Value Date   NA 140 07/22/2014   K 4.3 07/22/2014   CL 105 07/22/2014   CO2 27 07/22/2014   BUN 17 07/22/2014   CREATININE 0.46 08/05/2014   GLUCOSE 91 07/22/2014    Discharge Medications:     Medication List    TAKE these medications        ASTRAGALUS PO  Take 400 mg by mouth every morning.     calcium carbonate 500 MG chewable tablet  Commonly known as:  TUMS - dosed in mg elemental calcium  Chew 1 tablet by mouth every morning.     CHROMIUM PICOLATE PO  Take 1 tablet by mouth every morning.     Co Q 10 100 MG Caps  Take 100 mg by mouth every morning.     cyclobenzaprine 10 MG tablet  Commonly known as:  FLEXERIL  Take 1 tablet (10 mg total) by mouth at bedtime as needed for muscle spasms.     ESTER-C PO  Take 1,000 mg by mouth every morning.     FIRST-DUKES MOUTHWASH Susp  5-10cc's swish and spit tid     fish oil-omega-3 fatty acids 1000 MG capsule  Take 1 g by mouth every morning.     folic acid 003 MCG tablet  Commonly known as:  FOLVITE  Take 400 mcg by mouth every morning.     Garlic 7048 MG Caps  Take 2 mg by mouth every morning.     GLUCOSAMINE CHONDROITIN JOINT PO  Take by mouth.     hyoscyamine 0.125 MG tablet  Commonly known as:  LEVSIN, ANASPAZ  Take 0.125 mg by mouth every 4 (four) hours as needed for cramping.     magnesium gluconate 500 MG tablet  Commonly known as:  MAGONATE  Take 500 mg by mouth 2 (two) times daily.     Olive Leaf Extract 250 MG Caps  Take by mouth every morning.     ondansetron 4 MG tablet  Commonly known as:  ZOFRAN  Take 1 tablet (4 mg total) by mouth every 6 (six) hours as needed for  nausea.     ONE-A-DAY WOMENS 50 PLUS PO  Take by mouth.     oxyCODONE 5 MG immediate release tablet  Commonly known as:  Oxy IR/ROXICODONE     oxyCODONE 5 MG immediate release tablet  Commonly known as:  Oxy IR/ROXICODONE  Take 1-2 tablets (5-10 mg total) by mouth every 4 (four) hours as needed for breakthrough pain.  pantoprazole 40 MG tablet  Commonly known as:  PROTONIX  TAKE 1 TABLET DAILY     predniSONE 5 MG tablet  Commonly known as:  DELTASONE  12 day 5 mg taper [id number]     promethazine 25 MG tablet  Commonly known as:  PHENERGAN  Take 25 mg by mouth every 6 (six) hours as needed for nausea.     pyridOXINE 100 MG tablet  Commonly known as:  VITAMIN B-6  Take 100 mg by mouth daily.     rivaroxaban 10 MG Tabs tablet  Commonly known as:  XARELTO  Take 1 tablet (10 mg total) by mouth daily.     terbinafine 250 MG tablet  Commonly known as:  LAMISIL  Take 250 mg by mouth every morning.     topiramate 25 MG tablet  Commonly known as:  TOPAMAX  Take 50 mg by mouth at bedtime.     traMADol 50 MG tablet  Commonly known as:  ULTRAM  Take 1 tablet (50 mg total) by mouth every 6 (six) hours as needed.     vitamin B-12 1000 MCG tablet  Commonly known as:  CYANOCOBALAMIN  Take 1,000 mcg by mouth daily.     VITAMIN D PO  Take 800 Units by mouth.     vitamin E 400 UNIT capsule  Generic drug:  vitamin E  Take 400 Units by mouth daily.        Diagnostic Studies: Dg Hip Operative Unilat With Pelvis Right  08/05/2014   EXAM: OPERATIVE right HIP (WITH PELVIS IF PERFORMED)  VIEWS  TECHNIQUE: Fluoroscopic spot image(s) were submitted for interpretation post-operatively.  FLUOROSCOPY TIME:  Fluoroscopy Time:  0 minutes 25 seconds  Number of Acquired Images:  1  COMPARISON:  None.  FINDINGS: Right hip replacement with good anatomic alignment on one view.  IMPRESSION: Right hip replacement.   Electronically Signed   By: Marcello Moores  Register   On: 08/05/2014 09:36   Dg  Hip Unilat With Pelvis 2-3 Views Right  08/05/2014   CLINICAL DATA:  Postop right hip replacement  EXAM: RIGHT HIP (WITH PELVIS) 2-3 VIEWS  COMPARISON:  Left hip films of 05/01/2014  FINDINGS: A new right hip replacement is present with the femoral and acetabular components in good position. No complicating features are seen. Some air is noted in the soft tissues postoperatively.  IMPRESSION: Right total hip replacement in good position. No complicating features.   Electronically Signed   By: Ivar Drape M.D.   On: 08/05/2014 10:01    Disposition:       Discharge Instructions    Diet - low sodium heart healthy    Complete by:  As directed      Increase activity slowly    Complete by:  As directed            Follow-up Information    Follow up with MENZ,MICHAEL, MD In 2 weeks.   Specialty:  Orthopedic Surgery   Why:  For staple removal and skin check   Contact information:   [place] [id number] [phone]        Signed: Feliberto Gottron 08/08/2014, 7:30 AM

## 2014-08-08 NOTE — Plan of Care (Signed)
Problem: Acute Rehab PT Goals(only PT should resolve) Goal: Pt Will Go Supine/Side To Sit Outcome: Adequate for Discharge Pt's husband reports he can assist pt (pt declining to perform bed mobility in hospital bed).

## 2014-08-08 NOTE — Care Management Note (Signed)
Case Management Note  Patient Details  Name: Jennifer Hodges MRN: [id number] Date of Birth: 07/25/1945  Subjective/Objective:    Discharging today to home with home PT through [facility]. [facility] notified of discharge. [facility] updated. Pt has previously been on Xarelto and denies issues obtaining prescription.                Action/Plan:   Expected Discharge Date:   08/08/2014               Expected Discharge Plan:  [place]  In-House Referral:     Discharge planning Services  CM Consult  Post Acute Care Choice:  Home Health Choice offered to:  Patient  DME Arranged:    DME Agency:     HH Arranged:  PT HH Agency:  [facility]  Status of Service:  In process, will continue to follow  Medicare Important Message Given:  Yes Date Medicare IM Given:  08/06/14 Medicare IM give by:  [place] July Date Additional Medicare IM Given:    Additional Medicare Important Message give by:     If discussed at [place] of Stay Meetings, dates discussed:    Additional Comments:  Jolly Mango, RN 08/08/2014, 8:53 AM

## 2014-08-08 NOTE — Discharge Instructions (Signed)
°ANTERIOR APPROACH TOTAL HIP REPLACEMENT POSTOPERATIVE DIRECTIONS ° ° °Hip Rehabilitation, Guidelines Following Surgery  °The results of a hip operation are greatly improved after range of motion and muscle strengthening exercises. Follow all safety measures which are given to protect your hip. If any of these exercises cause increased pain or swelling in your joint, decrease the amount until you are comfortable again. Then slowly increase the exercises. Call your caregiver if you have problems or questions.  ° °HOME CARE INSTRUCTIONS  °Remove items at home which could result in a fall. This includes throw rugs or furniture in walking pathways.  °· ICE to the affected hip every three hours for 30 minutes at a time and then as needed for pain and swelling.  Continue to use ice on the hip for pain and swelling from surgery. You may notice swelling that will progress down to the foot and ankle.  This is normal after surgery.  Elevate the leg when you are not up walking on it.   °· Continue to use the breathing machine which will help keep your temperature down.  It is common for your temperature to cycle up and down following surgery, especially at night when you are not up moving around and exerting yourself.  The breathing machine keeps your lungs expanded and your temperature down. °· Do not place pillow under knee, focus on keeping the knee straight while resting ° °DIET °You may resume your previous home diet once your are discharged from the hospital. ° °DRESSING / WOUND CARE / SHOWERING °Keep dressing clean and dry. Change dressing only as needed. You may shower after your first follow up appointment with [facility]. ° °ACTIVITY °Walk with your walker as instructed. °Use walker as long as suggested by your caregivers. °Avoid periods of inactivity such as sitting longer than an hour when not asleep. This helps prevent blood clots.  °You may resume a sexual relationship in one month or when given the OK  by your doctor.  °You may return to work once you are cleared by your doctor.  °Do not drive a car for 6 weeks or until released by you surgeon.  °Do not drive while taking narcotics. ° °WEIGHT BEARING °Weight bearing as tolerated. Use walker/cane as needed for at least 4 weeks post op. ° °POSTOPERATIVE CONSTIPATION PROTOCOL °Constipation - defined medically as fewer than three stools per week and severe constipation as less than one stool per week. ° °One of the most common issues patients have following surgery is constipation.  Even if you have a regular bowel pattern at home, your normal regimen is likely to be disrupted due to multiple reasons following surgery.  Combination of anesthesia, postoperative narcotics, change in appetite and fluid intake all can affect your bowels.  In order to avoid complications following surgery, here are some recommendations in order to help you during your recovery period. ° °Colace (docusate) - Pick up an over-the-counter form of Colace or another stool softener and take twice a day as long as you are requiring postoperative pain medications.  Take with a full glass of water daily.  If you experience loose stools or diarrhea, hold the colace until you stool forms back up.  If your symptoms do not get better within 1 week or if they get worse, check with your doctor. ° °Dulcolax (bisacodyl) - Pick up over-the-counter and take as directed by the product packaging as needed to assist with the movement of your bowels.  Take with a   full glass of water.  Use this product as needed if not relieved by Colace only.   MiraLax (polyethylene glycol) - Pick up over-the-counter to have on hand.  MiraLax is a solution that will increase the amount of water in your bowels to assist with bowel movements.  Take as directed and can mix with a glass of water, juice, soda, coffee, or tea.  Take if you go more than two days without a movement. Do not use MiraLax more than once per day. Call your  doctor if you are still constipated or irregular after using this medication for 7 days in a row.  If you continue to have problems with postoperative constipation, please contact the office for further assistance and recommendations.  If you experience "the worst abdominal pain ever" or develop nausea or vomiting, please contact the office immediatly for further recommendations for treatment.  ITCHING  If you experience itching with your medications, try taking only a single pain pill, or even half a pain pill at a time.  You can also use Benadryl over the counter for itching or also to help with sleep.   TED HOSE STOCKINGS Wear the elastic stockings on both legs for six weeks following surgery during the day but you may remove then at night for sleeping.  MEDICATIONS See your medication summary on the After Visit Summary that the nursing staff will review with you prior to discharge.  You may have some home medications which will be placed on hold until you complete the course of blood thinner medication.  It is important for you to complete the blood thinner medication as prescribed by your surgeon.  Continue your approved medications as instructed at time of discharge.  PRECAUTIONS If you experience chest pain or shortness of breath - call 911 immediately for transfer to the hospital emergency department.  If you develop a fever greater that 101 F, purulent drainage from wound, increased redness or drainage from wound, foul odor from the wound/dressing, or calf pain - CONTACT YOUR SURGEON.                                                   FOLLOW-UP APPOINTMENTS Make sure you keep all of your appointments after your operation with your surgeon and caregivers. You should call the office at the above phone number and make an appointment for approximately two weeks after the date of your surgery or on the date instructed by your surgeon outlined in the "After Visit Summary".  RANGE OF MOTION  AND STRENGTHENING EXERCISES  These exercises are designed to help you keep full movement of your hip joint. Follow your caregiver's or physical therapist's instructions. Perform all exercises about fifteen times, three times per day or as directed. Exercise both hips, even if you have had only one joint replacement. These exercises can be done on a training (exercise) mat, on the floor, on a table or on a bed. Use whatever works the best and is most comfortable for you. Use music or television while you are exercising so that the exercises are a pleasant break in your day. This will make your life better with the exercises acting as a break in routine you can look forward to.  Lying on your back, slowly slide your foot toward your buttocks, raising your knee up off the floor. Then  slowly slide your foot back down until your leg is straight again.  Lying on your back spread your legs as far apart as you can without causing discomfort.  Lying on your side, raise your upper leg and foot straight up from the floor as far as is comfortable. Slowly lower the leg and repeat.  Lying on your back, tighten up the muscle in the front of your thigh (quadriceps muscles). You can do this by keeping your leg straight and trying to raise your heel off the floor. This helps strengthen the largest muscle supporting your knee.  Lying on your back, tighten up the muscles of your buttocks both with the legs straight and with the knee bent at a comfortable angle while keeping your heel on the floor.   IF YOU ARE TRANSFERRED TO A SKILLED REHAB FACILITY If the patient is transferred to a skilled rehab facility following release from the hospital, a list of the current medications will be sent to the facility for the patient to continue.  When discharged from the skilled rehab facility, please have the facility set up the patient's [place] prior to being released. Also, the skilled facility will be responsible  for providing the patient with their medications at time of release from the facility to include their pain medication, the muscle relaxants, and their blood thinner medication. If the patient is still at the rehab facility at time of the two week follow up appointment, the skilled rehab facility will also need to assist the patient in arranging follow up appointment in our office and any transportation needs.  MAKE SURE YOU:  Understand these instructions.  Get help right away if you are not doing well or get worse.    Pick up stool softner and laxative for home use following surgery while on pain medications. Continue to use ice for pain and swelling after surgery. Do not use any lotions or creams on the incision until instructed by your surgeon.

## 2014-08-08 NOTE — Progress Notes (Signed)
Patient discharged home today with [facility]. Patient discharge & rx instructions given. Belongings packed. VSS at time of discharge.

## 2014-08-09 DIAGNOSIS — Z87891 Personal history of nicotine dependence: Secondary | ICD-10-CM | POA: Diagnosis not present

## 2014-08-09 DIAGNOSIS — Z471 Aftercare following joint replacement surgery: Secondary | ICD-10-CM | POA: Diagnosis not present

## 2014-08-09 DIAGNOSIS — Z6832 Body mass index (BMI) 32.0-32.9, adult: Secondary | ICD-10-CM | POA: Diagnosis not present

## 2014-08-09 DIAGNOSIS — J45909 Unspecified asthma, uncomplicated: Secondary | ICD-10-CM | POA: Diagnosis not present

## 2014-08-09 DIAGNOSIS — M5186 Other intervertebral disc disorders, lumbar region: Secondary | ICD-10-CM | POA: Diagnosis not present

## 2014-08-09 DIAGNOSIS — Z96643 Presence of artificial hip joint, bilateral: Secondary | ICD-10-CM | POA: Diagnosis not present

## 2014-08-13 ENCOUNTER — Other Ambulatory Visit: Payer: Self-pay | Admitting: Internal Medicine

## 2014-08-13 ENCOUNTER — Telehealth: Payer: Self-pay | Admitting: Internal Medicine

## 2014-08-13 DIAGNOSIS — Z6832 Body mass index (BMI) 32.0-32.9, adult: Secondary | ICD-10-CM | POA: Diagnosis not present

## 2014-08-13 DIAGNOSIS — Z87891 Personal history of nicotine dependence: Secondary | ICD-10-CM | POA: Diagnosis not present

## 2014-08-13 DIAGNOSIS — M1711 Unilateral primary osteoarthritis, right knee: Secondary | ICD-10-CM | POA: Diagnosis not present

## 2014-08-13 DIAGNOSIS — Z471 Aftercare following joint replacement surgery: Secondary | ICD-10-CM | POA: Diagnosis not present

## 2014-08-13 DIAGNOSIS — J45909 Unspecified asthma, uncomplicated: Secondary | ICD-10-CM | POA: Diagnosis not present

## 2014-08-13 DIAGNOSIS — M5186 Other intervertebral disc disorders, lumbar region: Secondary | ICD-10-CM | POA: Diagnosis not present

## 2014-08-13 DIAGNOSIS — Z96643 Presence of artificial hip joint, bilateral: Secondary | ICD-10-CM | POA: Diagnosis not present

## 2014-08-13 MED ORDER — NYSTATIN 100000 UNIT/ML MT SUSP: OROMUCOSAL | Status: DC

## 2014-08-13 MED ORDER — FLUCONAZOLE 150 MG PO TABS: 150.0000 mg | ORAL_TABLET | Freq: Once | ORAL | Status: DC

## 2014-08-13 NOTE — Telephone Encounter (Signed)
Pt request rx for yeast infection. Pt has had recent bilateral hip surg. Pt has tried OTC meds with no success. Please advise pt/ac

## 2014-08-13 NOTE — Progress Notes (Signed)
Order placed for nystatin suspension as directed and diflucan '150mg'$  x 1 - per phone note request.

## 2014-08-13 NOTE — Telephone Encounter (Signed)
Pt came in asking for rx for yeast infection. Pt recently had bilateral hip surg and meds have given her a yeast infection. Pt. Has tried over the counter treatment with no success.Marland KitchenAC

## 2014-08-13 NOTE — Telephone Encounter (Signed)
I have sent in rx for diflucan '150mg'$  x 1 for the vaginal infection and nystatin suspension for her mouth.

## 2014-08-13 NOTE — Telephone Encounter (Signed)
Pt states that it is vaginal yeast infection (cottage cheese type), and she has tried [employer] OTC with no relief. And she has noticed a white patch/film on tongue too.

## 2014-08-13 NOTE — Telephone Encounter (Signed)
Pt.notified

## 2014-08-13 NOTE — Telephone Encounter (Signed)
Where is she having problems with yeast?  Vaginal yeast infection, under her breast, inguinal area, etc?

## 2014-08-13 NOTE — Telephone Encounter (Signed)
LMTCB for additional information

## 2014-08-15 DIAGNOSIS — M5186 Other intervertebral disc disorders, lumbar region: Secondary | ICD-10-CM | POA: Diagnosis not present

## 2014-08-15 DIAGNOSIS — Z6832 Body mass index (BMI) 32.0-32.9, adult: Secondary | ICD-10-CM | POA: Diagnosis not present

## 2014-08-15 DIAGNOSIS — J45909 Unspecified asthma, uncomplicated: Secondary | ICD-10-CM | POA: Diagnosis not present

## 2014-08-15 DIAGNOSIS — Z471 Aftercare following joint replacement surgery: Secondary | ICD-10-CM | POA: Diagnosis not present

## 2014-08-15 DIAGNOSIS — Z87891 Personal history of nicotine dependence: Secondary | ICD-10-CM | POA: Diagnosis not present

## 2014-08-15 DIAGNOSIS — Z96643 Presence of artificial hip joint, bilateral: Secondary | ICD-10-CM | POA: Diagnosis not present

## 2014-08-16 DIAGNOSIS — J45909 Unspecified asthma, uncomplicated: Secondary | ICD-10-CM | POA: Diagnosis not present

## 2014-08-16 DIAGNOSIS — Z6832 Body mass index (BMI) 32.0-32.9, adult: Secondary | ICD-10-CM | POA: Diagnosis not present

## 2014-08-16 DIAGNOSIS — Z96643 Presence of artificial hip joint, bilateral: Secondary | ICD-10-CM | POA: Diagnosis not present

## 2014-08-16 DIAGNOSIS — Z87891 Personal history of nicotine dependence: Secondary | ICD-10-CM | POA: Diagnosis not present

## 2014-08-16 DIAGNOSIS — Z471 Aftercare following joint replacement surgery: Secondary | ICD-10-CM | POA: Diagnosis not present

## 2014-08-16 DIAGNOSIS — M5186 Other intervertebral disc disorders, lumbar region: Secondary | ICD-10-CM | POA: Diagnosis not present

## 2014-08-18 DIAGNOSIS — Z471 Aftercare following joint replacement surgery: Secondary | ICD-10-CM | POA: Diagnosis not present

## 2014-08-18 DIAGNOSIS — Z6832 Body mass index (BMI) 32.0-32.9, adult: Secondary | ICD-10-CM | POA: Diagnosis not present

## 2014-08-18 DIAGNOSIS — Z87891 Personal history of nicotine dependence: Secondary | ICD-10-CM | POA: Diagnosis not present

## 2014-08-18 DIAGNOSIS — Z96643 Presence of artificial hip joint, bilateral: Secondary | ICD-10-CM | POA: Diagnosis not present

## 2014-08-18 DIAGNOSIS — J45909 Unspecified asthma, uncomplicated: Secondary | ICD-10-CM | POA: Diagnosis not present

## 2014-08-18 DIAGNOSIS — M5186 Other intervertebral disc disorders, lumbar region: Secondary | ICD-10-CM | POA: Diagnosis not present

## 2014-08-20 DIAGNOSIS — Z87891 Personal history of nicotine dependence: Secondary | ICD-10-CM | POA: Diagnosis not present

## 2014-08-20 DIAGNOSIS — Z96643 Presence of artificial hip joint, bilateral: Secondary | ICD-10-CM | POA: Diagnosis not present

## 2014-08-20 DIAGNOSIS — M5186 Other intervertebral disc disorders, lumbar region: Secondary | ICD-10-CM | POA: Diagnosis not present

## 2014-08-20 DIAGNOSIS — Z6832 Body mass index (BMI) 32.0-32.9, adult: Secondary | ICD-10-CM | POA: Diagnosis not present

## 2014-08-20 DIAGNOSIS — Z471 Aftercare following joint replacement surgery: Secondary | ICD-10-CM | POA: Diagnosis not present

## 2014-08-20 DIAGNOSIS — J45909 Unspecified asthma, uncomplicated: Secondary | ICD-10-CM | POA: Diagnosis not present

## 2014-08-22 DIAGNOSIS — Z6832 Body mass index (BMI) 32.0-32.9, adult: Secondary | ICD-10-CM | POA: Diagnosis not present

## 2014-08-22 DIAGNOSIS — J45909 Unspecified asthma, uncomplicated: Secondary | ICD-10-CM | POA: Diagnosis not present

## 2014-08-22 DIAGNOSIS — Z96643 Presence of artificial hip joint, bilateral: Secondary | ICD-10-CM | POA: Diagnosis not present

## 2014-08-22 DIAGNOSIS — Z87891 Personal history of nicotine dependence: Secondary | ICD-10-CM | POA: Diagnosis not present

## 2014-08-22 DIAGNOSIS — Z471 Aftercare following joint replacement surgery: Secondary | ICD-10-CM | POA: Diagnosis not present

## 2014-08-22 DIAGNOSIS — M5186 Other intervertebral disc disorders, lumbar region: Secondary | ICD-10-CM | POA: Diagnosis not present

## 2014-08-25 DIAGNOSIS — Z96643 Presence of artificial hip joint, bilateral: Secondary | ICD-10-CM | POA: Diagnosis not present

## 2014-08-25 DIAGNOSIS — Z87891 Personal history of nicotine dependence: Secondary | ICD-10-CM | POA: Diagnosis not present

## 2014-08-25 DIAGNOSIS — Z6832 Body mass index (BMI) 32.0-32.9, adult: Secondary | ICD-10-CM | POA: Diagnosis not present

## 2014-08-25 DIAGNOSIS — M5186 Other intervertebral disc disorders, lumbar region: Secondary | ICD-10-CM | POA: Diagnosis not present

## 2014-08-25 DIAGNOSIS — J45909 Unspecified asthma, uncomplicated: Secondary | ICD-10-CM | POA: Diagnosis not present

## 2014-08-25 DIAGNOSIS — Z471 Aftercare following joint replacement surgery: Secondary | ICD-10-CM | POA: Diagnosis not present

## 2014-08-26 DIAGNOSIS — Z471 Aftercare following joint replacement surgery: Secondary | ICD-10-CM | POA: Diagnosis not present

## 2014-08-26 DIAGNOSIS — J45909 Unspecified asthma, uncomplicated: Secondary | ICD-10-CM | POA: Diagnosis not present

## 2014-08-26 DIAGNOSIS — M5186 Other intervertebral disc disorders, lumbar region: Secondary | ICD-10-CM | POA: Diagnosis not present

## 2014-08-26 DIAGNOSIS — Z6832 Body mass index (BMI) 32.0-32.9, adult: Secondary | ICD-10-CM | POA: Diagnosis not present

## 2014-08-26 DIAGNOSIS — Z96643 Presence of artificial hip joint, bilateral: Secondary | ICD-10-CM | POA: Diagnosis not present

## 2014-08-26 DIAGNOSIS — Z87891 Personal history of nicotine dependence: Secondary | ICD-10-CM | POA: Diagnosis not present

## 2014-08-27 DIAGNOSIS — Z87891 Personal history of nicotine dependence: Secondary | ICD-10-CM | POA: Diagnosis not present

## 2014-08-27 DIAGNOSIS — Z6832 Body mass index (BMI) 32.0-32.9, adult: Secondary | ICD-10-CM | POA: Diagnosis not present

## 2014-08-27 DIAGNOSIS — Z471 Aftercare following joint replacement surgery: Secondary | ICD-10-CM | POA: Diagnosis not present

## 2014-08-27 DIAGNOSIS — J45909 Unspecified asthma, uncomplicated: Secondary | ICD-10-CM | POA: Diagnosis not present

## 2014-08-27 DIAGNOSIS — M5186 Other intervertebral disc disorders, lumbar region: Secondary | ICD-10-CM | POA: Diagnosis not present

## 2014-08-27 DIAGNOSIS — Z96643 Presence of artificial hip joint, bilateral: Secondary | ICD-10-CM | POA: Diagnosis not present

## 2014-09-01 DIAGNOSIS — Z96643 Presence of artificial hip joint, bilateral: Secondary | ICD-10-CM | POA: Diagnosis not present

## 2014-09-01 DIAGNOSIS — J45909 Unspecified asthma, uncomplicated: Secondary | ICD-10-CM | POA: Diagnosis not present

## 2014-09-01 DIAGNOSIS — M5186 Other intervertebral disc disorders, lumbar region: Secondary | ICD-10-CM | POA: Diagnosis not present

## 2014-09-01 DIAGNOSIS — Z6832 Body mass index (BMI) 32.0-32.9, adult: Secondary | ICD-10-CM | POA: Diagnosis not present

## 2014-09-01 DIAGNOSIS — Z87891 Personal history of nicotine dependence: Secondary | ICD-10-CM | POA: Diagnosis not present

## 2014-09-01 DIAGNOSIS — Z471 Aftercare following joint replacement surgery: Secondary | ICD-10-CM | POA: Diagnosis not present

## 2014-09-03 DIAGNOSIS — M5186 Other intervertebral disc disorders, lumbar region: Secondary | ICD-10-CM | POA: Diagnosis not present

## 2014-09-03 DIAGNOSIS — Z471 Aftercare following joint replacement surgery: Secondary | ICD-10-CM | POA: Diagnosis not present

## 2014-09-03 DIAGNOSIS — Z6832 Body mass index (BMI) 32.0-32.9, adult: Secondary | ICD-10-CM | POA: Diagnosis not present

## 2014-09-03 DIAGNOSIS — J45909 Unspecified asthma, uncomplicated: Secondary | ICD-10-CM | POA: Diagnosis not present

## 2014-09-03 DIAGNOSIS — Z96643 Presence of artificial hip joint, bilateral: Secondary | ICD-10-CM | POA: Diagnosis not present

## 2014-09-03 DIAGNOSIS — Z87891 Personal history of nicotine dependence: Secondary | ICD-10-CM | POA: Diagnosis not present

## 2014-09-05 DIAGNOSIS — Z6832 Body mass index (BMI) 32.0-32.9, adult: Secondary | ICD-10-CM | POA: Diagnosis not present

## 2014-09-05 DIAGNOSIS — Z96643 Presence of artificial hip joint, bilateral: Secondary | ICD-10-CM | POA: Diagnosis not present

## 2014-09-05 DIAGNOSIS — M5186 Other intervertebral disc disorders, lumbar region: Secondary | ICD-10-CM | POA: Diagnosis not present

## 2014-09-05 DIAGNOSIS — J45909 Unspecified asthma, uncomplicated: Secondary | ICD-10-CM | POA: Diagnosis not present

## 2014-09-05 DIAGNOSIS — Z87891 Personal history of nicotine dependence: Secondary | ICD-10-CM | POA: Diagnosis not present

## 2014-09-05 DIAGNOSIS — Z471 Aftercare following joint replacement surgery: Secondary | ICD-10-CM | POA: Diagnosis not present

## 2014-09-09 DIAGNOSIS — J310 Chronic rhinitis: Secondary | ICD-10-CM | POA: Diagnosis not present

## 2014-09-09 DIAGNOSIS — J479 Bronchiectasis, uncomplicated: Secondary | ICD-10-CM | POA: Diagnosis not present

## 2014-09-10 DIAGNOSIS — N993 Prolapse of vaginal vault after hysterectomy: Secondary | ICD-10-CM | POA: Diagnosis not present

## 2014-09-10 DIAGNOSIS — N3946 Mixed incontinence: Secondary | ICD-10-CM | POA: Diagnosis not present

## 2014-09-10 DIAGNOSIS — R339 Retention of urine, unspecified: Secondary | ICD-10-CM | POA: Diagnosis not present

## 2014-09-23 DIAGNOSIS — M8580 Other specified disorders of bone density and structure, unspecified site: Secondary | ICD-10-CM | POA: Diagnosis not present

## 2014-09-23 DIAGNOSIS — M150 Primary generalized (osteo)arthritis: Secondary | ICD-10-CM | POA: Diagnosis not present

## 2014-09-24 DIAGNOSIS — Z96641 Presence of right artificial hip joint: Secondary | ICD-10-CM | POA: Diagnosis not present

## 2014-10-28 DIAGNOSIS — B351 Tinea unguium: Secondary | ICD-10-CM | POA: Diagnosis not present

## 2014-10-30 ENCOUNTER — Telehealth: Payer: Self-pay | Admitting: Internal Medicine

## 2014-10-30 NOTE — Telephone Encounter (Signed)
This is a new complaint for her.  I would not order an MRI without seeing pt.  If she is having this much of a change, needs evaluation before 11/14/14.  I would recommend evaluation today - acute care, [facility] or ER (pending complaints)

## 2014-10-30 NOTE — Telephone Encounter (Signed)
Pt is having pain waist down to spine. Pt states she's never had that type of pain before it feels like she's going down to the floor. Pt wants to know should she have an MRI done before Monday? Thank You!

## 2014-10-30 NOTE — Telephone Encounter (Signed)
Please advise?  I don't see an order for an MRI, has a follow up appt with you on 9/2.

## 2014-10-31 NOTE — Telephone Encounter (Signed)
Left a message to have patient return my call.

## 2014-11-14 ENCOUNTER — Encounter: Payer: Self-pay | Admitting: Internal Medicine

## 2014-11-14 ENCOUNTER — Ambulatory Visit (INDEPENDENT_AMBULATORY_CARE_PROVIDER_SITE_OTHER): Payer: Medicare Other | Admitting: Internal Medicine

## 2014-11-14 VITALS — BP 100/62 | HR 75 | Temp 98.1°F | Ht 62.0 in | Wt 171.2 lb

## 2014-11-14 DIAGNOSIS — M810 Age-related osteoporosis without current pathological fracture: Secondary | ICD-10-CM

## 2014-11-14 DIAGNOSIS — G4730 Sleep apnea, unspecified: Secondary | ICD-10-CM

## 2014-11-14 DIAGNOSIS — R102 Pelvic and perineal pain: Secondary | ICD-10-CM

## 2014-11-14 DIAGNOSIS — E7800 Pure hypercholesterolemia, unspecified: Secondary | ICD-10-CM

## 2014-11-14 DIAGNOSIS — Z0000 Encounter for general adult medical examination without abnormal findings: Secondary | ICD-10-CM

## 2014-11-14 DIAGNOSIS — Z1239 Encounter for other screening for malignant neoplasm of breast: Secondary | ICD-10-CM | POA: Diagnosis not present

## 2014-11-14 DIAGNOSIS — R772 Abnormality of alphafetoprotein: Secondary | ICD-10-CM

## 2014-11-14 DIAGNOSIS — J479 Bronchiectasis, uncomplicated: Secondary | ICD-10-CM

## 2014-11-14 DIAGNOSIS — K219 Gastro-esophageal reflux disease without esophagitis: Secondary | ICD-10-CM

## 2014-11-14 LAB — BASIC METABOLIC PANEL
BUN: 11 mg/dL (ref 6–23)
CO2: 30 mEq/L (ref 19–32)
Calcium: 9.8 mg/dL (ref 8.4–10.5)
Chloride: 105 mEq/L (ref 96–112)
Creatinine, Ser: 0.54 mg/dL (ref 0.40–1.20)
GFR: 118.43 mL/min (ref >60.00)
Glucose, Bld: 83 mg/dL (ref 70–99)
Potassium: 5.1 mEq/L (ref 3.5–5.1)
Sodium: 144 mEq/L (ref 135–145)

## 2014-11-14 LAB — CBC WITH DIFFERENTIAL/PLATELET
Basophils Absolute: 0 10*3/uL (ref 0.0–0.1)
Basophils Relative: 0.5 % (ref 0.0–3.0)
Eosinophils Absolute: 0.1 10*3/uL (ref 0.0–0.7)
Eosinophils Relative: 1.7 % (ref 0.0–5.0)
HCT: 41.4 % (ref 36.0–46.0)
Hemoglobin: 13.8 g/dL (ref 12.0–15.0)
Lymphocytes Relative: 40.2 % (ref 12.0–46.0)
Lymphs Abs: 1.5 10*3/uL (ref 0.7–4.0)
MCHC: 33.3 g/dL (ref 30.0–36.0)
MCV: 89.2 fl (ref 78.0–100.0)
Monocytes Absolute: 0.3 10*3/uL (ref 0.1–1.0)
Monocytes Relative: 7.1 % (ref 3.0–12.0)
Neutro Abs: 1.9 10*3/uL (ref 1.4–7.7)
Neutrophils Relative %: 50.5 % (ref 43.0–77.0)
Platelets: 259 10*3/uL (ref 150.0–400.0)
RBC: 4.64 Mil/uL (ref 3.87–5.11)
RDW: 14.8 % (ref 11.5–15.5)
WBC: 3.8 10*3/uL — ABNORMAL LOW (ref 4.0–10.5)

## 2014-11-14 LAB — LIPID PANEL
Cholesterol: 191 mg/dL (ref 0–200)
HDL: 49.8 mg/dL (ref >39.00)
LDL Cholesterol: 117 mg/dL — ABNORMAL HIGH (ref 0–99)
NonHDL: 140.96
Total CHOL/HDL Ratio: 4
Triglycerides: 120 mg/dL (ref 0.0–149.0)
VLDL: 24 mg/dL (ref 0.0–40.0)

## 2014-11-14 LAB — HEPATIC FUNCTION PANEL
ALT: 12 U/L (ref 0–35)
AST: 19 U/L (ref 0–37)
Albumin: 4.5 g/dL (ref 3.5–5.2)
Alkaline Phosphatase: 120 U/L — ABNORMAL HIGH (ref 39–117)
Bilirubin, Direct: 0.1 mg/dL (ref 0.0–0.3)
Total Bilirubin: 0.5 mg/dL (ref 0.2–1.2)
Total Protein: 6.7 g/dL (ref 6.0–8.3)

## 2014-11-14 LAB — VITAMIN D 25 HYDROXY (VIT D DEFICIENCY, FRACTURES): VITD: 25.53 ng/mL — ABNORMAL LOW (ref 30.00–100.00)

## 2014-11-14 LAB — TSH: TSH: 2.53 u[IU]/mL (ref 0.35–4.50)

## 2014-11-14 NOTE — Progress Notes (Signed)
Pre-visit discussion using our clinic review tool. No additional management support is needed unless otherwise documented below in the visit note.  

## 2014-11-14 NOTE — Progress Notes (Signed)
Patient ID: Jennifer Hodges, female   DOB: 06/18/1945, 69 y.o.   MRN: [id number]   Subjective:    Patient ID: Jennifer Hodges, female    DOB: Feb 16, 1946, 69 y.o.   MRN: [id number]  HPI  Patient here to follow up on her current medical issues and for her exam.  She had right knee surgery 08/05/14.  Following with Dr Rudene Christians.  Is riding her bike one mile and walking on the treadmill one mile when she exercises.  Has f/u in 12/2014.  Has stenosis of her back - per pt.  May need to f/u with Dr Sharlet Salina.  Ortho to evaluate.  No cardiac symptoms with increased activity or exertion.  No sob.  Eating and drinking well.  Bowels stable.  Some intermittent pelvic pain.  Flares intermittently.  Only has occurred a few times.  No pain now.  Discussed further w/up.  She wants to monitor for now.     Past Medical History  Diagnosis Date  . Hypercholesterolemia   . Bronchiectasis     mild  . Asthma   . GERD (gastroesophageal reflux disease)     EGD 8/09- non bleeding erosive gastritis, documentd esophageal ulcerations.   . Diverticulosis   . Hiatal hernia   . Sleep apnea     cpap asked to bring mask and tubing  . Osteoarthritis     lumbar disc disease, left hip  . Chronic headaches      followed by [facility]   Past Surgical History  Procedure Laterality Date  . Appendectomy    . Cholecystectomy    . Abdominal hysterectomy  age 80  . Right oophorectomy    . Excisional hemorrhoidectomy    . Lumbar laminectomy    . Eus N/A 05/31/2012    Procedure: UPPER ENDOSCOPIC ULTRASOUND (EUS) LINEAR;  Surgeon: [name], MD;  Location: [facility];  Service: Endoscopy;  Laterality: N/A;  . Total hip arthroplasty Left 05/01/2014    Dr. [name]  . Breast surgery Bilateral     cyst removed and reduction  . Back surgery      4th lumbar fusion  . Bladder surgery N/A     with vaginal wall repair  . Cataract extraction w/ intraocular lens implant Bilateral 2015  . Total hip arthroplasty  Right 08/05/2014    Procedure: TOTAL HIP ARTHROPLASTY ANTERIOR APPROACH;  Surgeon: [name], MD;  Location: [facility];  Service: Orthopedics;  Laterality: Right;   Family History  Problem Relation Age of Onset  . Heart disease Mother     s/p stent  . Hypertension Mother   . Hypercholesterolemia Mother   . Diabetes Father   . Stomach cancer      uncle  . Breast cancer Neg Hx    Social History   Social History  . Marital Status: Married    Spouse Name: N/A  . Number of Children: N/A  . Years of Education: N/A   Social History Main Topics  . Smoking status: Former Smoker    Quit date: 06/13/2007  . Smokeless tobacco: Never Used  . Alcohol Use: No  . Drug Use: No  . Sexual Activity: Yes    Birth Control/ Protection: Surgical   Other Topics Concern  . None   Social History Narrative    Outpatient Encounter Prescriptions as of 11/14/2014  Medication Sig  . ASTRAGALUS PO Take 400 mg by mouth every morning.  . cetirizine (ZYRTEC) 10 MG tablet Take 10 mg  by mouth daily.  . Cholecalciferol (VITAMIN D PO) Take 800 Units by mouth.  . Chromium Picolinate (CHROMIUM PICOLATE PO) Take 1 tablet by mouth every morning.  . Coenzyme Q10 (CO Q 10) 100 MG CAPS Take 100 mg by mouth every morning.   . cyclobenzaprine (FLEXERIL) 10 MG tablet Take 1 tablet (10 mg total) by mouth at bedtime as needed for muscle spasms.  . fish oil-omega-3 fatty acids 1000 MG capsule Take 1 g by mouth every morning.   . folic acid (FOLVITE) 676 MCG tablet Take 400 mcg by mouth every morning.  . Garlic 1950 MG CAPS Take 2 mg by mouth every morning.   . hyoscyamine (LEVSIN, ANASPAZ) 0.125 MG tablet Take 0.125 mg by mouth every 4 (four) hours as needed for cramping.  . magnesium gluconate (MAGONATE) 500 MG tablet Take 500 mg by mouth 2 (two) times daily.  . Multiple Vitamins-Minerals (ONE-A-DAY WOMENS 50 PLUS PO) Take by mouth.  Jonna Coup Leaf Extract 250 MG CAPS Take by mouth every morning.  . pantoprazole  (PROTONIX) 40 MG tablet TAKE 1 TABLET DAILY (Patient taking differently: daily prn)  . pyridOXINE (VITAMIN B-6) 100 MG tablet Take 100 mg by mouth daily.  Marland Kitchen topiramate (TOPAMAX) 25 MG tablet Take 50 mg by mouth at bedtime.   . traMADol (ULTRAM) 50 MG tablet Take 1 tablet (50 mg total) by mouth every 6 (six) hours as needed.  . vitamin B-12 (CYANOCOBALAMIN) 1000 MCG tablet Take 1,000 mcg by mouth daily.  . vitamin E (VITAMIN E) 400 UNIT capsule Take 400 Units by mouth daily.  . Vitamin Mixture (ESTER-C PO) Take 1,000 mg by mouth every morning.   . rivaroxaban (XARELTO) 10 MG TABS tablet Take 1 tablet (10 mg total) by mouth daily.  . [DISCONTINUED] calcium carbonate (TUMS - DOSED IN MG ELEMENTAL CALCIUM) 500 MG chewable tablet Chew 1 tablet by mouth every morning.  . [DISCONTINUED] Diphenhyd-Hydrocort-Nystatin (FIRST-DUKES MOUTHWASH) SUSP 5-10cc's swish and spit tid (Patient not taking: Reported on 08/05/2014)  . [DISCONTINUED] fluconazole (DIFLUCAN) 150 MG tablet Take 1 tablet (150 mg total) by mouth once.  . [DISCONTINUED] Glucos-Chondroit-Hyaluron-MSM (GLUCOSAMINE CHONDROITIN JOINT PO) Take by mouth.  . [DISCONTINUED] nystatin (MYCOSTATIN) 100000 UNIT/ML suspension 5 mls - Swish and spit tid  . [DISCONTINUED] ondansetron (ZOFRAN) 4 MG tablet Take 1 tablet (4 mg total) by mouth every 6 (six) hours as needed for nausea.  . [DISCONTINUED] oxyCODONE (OXY IR/ROXICODONE) 5 MG immediate release tablet   . [DISCONTINUED] oxyCODONE (OXY IR/ROXICODONE) 5 MG immediate release tablet Take 1-2 tablets (5-10 mg total) by mouth every 4 (four) hours as needed for breakthrough pain.  . [DISCONTINUED] predniSONE (DELTASONE) 5 MG tablet 12 day 5 mg taper [id number]  . [DISCONTINUED] promethazine (PHENERGAN) 25 MG tablet Take 25 mg by mouth every 6 (six) hours as needed for nausea.  . [DISCONTINUED] terbinafine (LAMISIL) 250 MG tablet Take 250 mg by mouth every morning.   No facility-administered encounter  medications on file as of 11/14/2014.    Review of Systems  Constitutional: Negative for appetite change and unexpected weight change.  HENT: Negative for congestion and sinus pressure.   Eyes: Negative for pain and visual disturbance.  Respiratory: Negative for cough, chest tightness and shortness of breath.   Cardiovascular: Negative for chest pain, palpitations and leg swelling.  Gastrointestinal: Negative for nausea, vomiting, abdominal pain and diarrhea.  Genitourinary: Negative for dysuria and difficulty urinating.  Musculoskeletal:       S/p knee surgery.  Seeing  Dr Rudene Christians.    Skin: Negative for color change and rash.  Neurological: Negative for dizziness, light-headedness and headaches.  Hematological: Negative for adenopathy. Does not bruise/bleed easily.  Psychiatric/Behavioral: Negative for dysphoric mood and agitation.       Objective:    Physical Exam  Constitutional: She is oriented to person, place, and time. She appears well-developed and well-nourished. No distress.  HENT:  Nose: Nose normal.  Mouth/Throat: Oropharynx is clear and moist.  Eyes: Right eye exhibits no discharge. Left eye exhibits no discharge. No scleral icterus.  Neck: Neck supple. No thyromegaly present.  Cardiovascular: Normal rate and regular rhythm.   Pulmonary/Chest: Breath sounds normal. No accessory muscle usage. No tachypnea. No respiratory distress. She has no decreased breath sounds. She has no wheezes. She has no rhonchi. Right breast exhibits no inverted nipple, no mass, no nipple discharge and no tenderness (no axillary adenopathy). Left breast exhibits no inverted nipple, no mass, no nipple discharge and no tenderness (no axilarry adenopathy).  Abdominal: Soft. Bowel sounds are normal. There is no tenderness.  Musculoskeletal: She exhibits no edema or tenderness.  Lymphadenopathy:    She has no cervical adenopathy.  Neurological: She is alert and oriented to person, place, and time.    Skin: Skin is warm. No rash noted. No erythema.  Psychiatric: She has a normal mood and affect. Her behavior is normal.    BP 100/62 mmHg  Pulse 75  Temp(Src) 98.1 F (36.7 C) (Oral)  Ht '5\' 2"'$  (1.575 m)  Wt 171 lb 4 oz (77.678 kg)  BMI 31.31 kg/m2  SpO2 96% Wt Readings from Last 3 Encounters:  11/14/14 171 lb 4 oz (77.678 kg)  08/05/14 180 lb (81.647 kg)  07/22/14 170 lb (77.111 kg)     Lab Results  Component Value Date   WBC 3.8* 11/14/2014   HGB 13.8 11/14/2014   HCT 41.4 11/14/2014   PLT 259.0 11/14/2014   GLUCOSE 83 11/14/2014   CHOL 191 11/14/2014   TRIG 120.0 11/14/2014   HDL 49.80 11/14/2014   LDLDIRECT 147.5 11/07/2012   LDLCALC 117* 11/14/2014   ALT 12 11/14/2014   AST 19 11/14/2014   NA 144 11/14/2014   K 5.1 11/14/2014   CL 105 11/14/2014   CREATININE 0.54 11/14/2014   BUN 11 11/14/2014   CO2 30 11/14/2014   TSH 2.53 11/14/2014   INR 0.92 07/22/2014   HGBA1C 5.7 11/11/2013       Assessment & Plan:   Problem List Items Addressed This Visit    Bronchiectasis    Mild.  Breathing stable.  Sees Dr [name].        Elevated AFP    Being followed by GI.  Last check - stable.  Has been followed regularly.  Has had extensive w/up.        GERD (gastroesophageal reflux disease)    Symptoms controlled on protonix.       Health care maintenance    Physical today 11/14/14.  Mammogram 10/29/13 - Birads I.  Schedule f/u mammogram.  Colonoscopy 01/10/12 - diverticulosis.        Hypercholesterolemia    Low cholesterol diet and exercise.  Follow lipid panel.       Relevant Orders   Lipid panel (Completed)   Osteoporosis   Relevant Orders   Vit D  25 hydroxy (rtn osteoporosis monitoring) (Completed)   Pelvic pain in female    Previous intermittent pelvic pain.  No pain now.  Discussed further w/up.  She  wants to monitor.  Will notify me if persistent.        Sleep apnea    Using CPAP.       Relevant Orders   CBC with Differential/Platelet  (Completed)   TSH (Completed)   Hepatic function panel (Completed)   Basic metabolic panel (Completed)    Other Visit Diagnoses    Screening breast examination    -  Primary    Relevant Orders    MM DIGITAL SCREENING BILATERAL        Einar Pheasant, MD

## 2014-11-15 ENCOUNTER — Other Ambulatory Visit: Payer: Self-pay | Admitting: Internal Medicine

## 2014-11-15 DIAGNOSIS — R748 Abnormal levels of other serum enzymes: Secondary | ICD-10-CM

## 2014-11-15 DIAGNOSIS — D72819 Decreased white blood cell count, unspecified: Secondary | ICD-10-CM

## 2014-11-15 NOTE — Progress Notes (Signed)
Order placed for f/u labs.  

## 2014-11-16 DIAGNOSIS — R102 Pelvic and perineal pain: Secondary | ICD-10-CM | POA: Insufficient documentation

## 2014-11-16 NOTE — Assessment & Plan Note (Signed)
Being followed by GI.  Last check - stable.  Has been followed regularly.  Has had extensive w/up.

## 2014-11-16 NOTE — Assessment & Plan Note (Signed)
Using CPAP 

## 2014-11-16 NOTE — Assessment & Plan Note (Signed)
Mild.  Breathing stable.  Sees Dr [name].

## 2014-11-16 NOTE — Assessment & Plan Note (Signed)
Previous intermittent pelvic pain.  No pain now.  Discussed further w/up.  She wants to monitor.  Will notify me if persistent.

## 2014-11-16 NOTE — Assessment & Plan Note (Signed)
Physical today 11/14/14.  Mammogram 10/29/13 - Birads I.  Schedule f/u mammogram.  Colonoscopy 01/10/12 - diverticulosis.

## 2014-11-16 NOTE — Assessment & Plan Note (Signed)
Symptoms controlled on protonix.

## 2014-11-16 NOTE — Assessment & Plan Note (Signed)
Low cholesterol diet and exercise.  Follow lipid panel.   

## 2014-11-20 ENCOUNTER — Encounter: Payer: Self-pay | Admitting: *Deleted

## 2014-11-20 ENCOUNTER — Other Ambulatory Visit: Payer: Self-pay | Admitting: Internal Medicine

## 2014-11-24 ENCOUNTER — Ambulatory Visit
Admission: RE | Admit: 2014-11-24 | Discharge: 2014-11-24 | Disposition: A | Discharge status: Home / Self Care | Payer: Medicare Other | Source: Ambulatory Visit | Attending: Internal Medicine | Admitting: Internal Medicine

## 2014-11-24 ENCOUNTER — Other Ambulatory Visit: Payer: Self-pay | Admitting: Internal Medicine

## 2014-11-24 DIAGNOSIS — Z1239 Encounter for other screening for malignant neoplasm of breast: Secondary | ICD-10-CM

## 2014-11-24 DIAGNOSIS — Z1231 Encounter for screening mammogram for malignant neoplasm of breast: Secondary | ICD-10-CM | POA: Insufficient documentation

## 2014-12-16 ENCOUNTER — Other Ambulatory Visit (INDEPENDENT_AMBULATORY_CARE_PROVIDER_SITE_OTHER): Payer: Medicare Other

## 2014-12-16 DIAGNOSIS — R748 Abnormal levels of other serum enzymes: Secondary | ICD-10-CM

## 2014-12-16 DIAGNOSIS — D72819 Decreased white blood cell count, unspecified: Secondary | ICD-10-CM | POA: Diagnosis not present

## 2014-12-16 LAB — CBC WITH DIFFERENTIAL/PLATELET
Basophils Absolute: 0 10*3/uL (ref 0.0–0.1)
Basophils Relative: 0.6 % (ref 0.0–3.0)
Eosinophils Absolute: 0.1 10*3/uL (ref 0.0–0.7)
Eosinophils Relative: 2.1 % (ref 0.0–5.0)
HCT: 39.1 % (ref 36.0–46.0)
Hemoglobin: 13.2 g/dL (ref 12.0–15.0)
Lymphocytes Relative: 44.3 % (ref 12.0–46.0)
Lymphs Abs: 1.8 10*3/uL (ref 0.7–4.0)
MCHC: 33.8 g/dL (ref 30.0–36.0)
MCV: 89 fl (ref 78.0–100.0)
Monocytes Absolute: 0.3 10*3/uL (ref 0.1–1.0)
Monocytes Relative: 7.5 % (ref 3.0–12.0)
Neutro Abs: 1.8 10*3/uL (ref 1.4–7.7)
Neutrophils Relative %: 45.5 % (ref 43.0–77.0)
Platelets: 235 10*3/uL (ref 150.0–400.0)
RBC: 4.39 Mil/uL (ref 3.87–5.11)
RDW: 13.9 % (ref 11.5–15.5)
WBC: 4 10*3/uL (ref 4.0–10.5)

## 2014-12-16 LAB — POTASSIUM: Potassium: 4.3 mEq/L (ref 3.5–5.1)

## 2014-12-16 LAB — ALKALINE PHOSPHATASE: Alkaline Phosphatase: 106 U/L (ref 39–117)

## 2014-12-22 ENCOUNTER — Encounter: Payer: Self-pay | Admitting: Internal Medicine

## 2014-12-22 ENCOUNTER — Ambulatory Visit (INDEPENDENT_AMBULATORY_CARE_PROVIDER_SITE_OTHER): Payer: Medicare Other | Admitting: Internal Medicine

## 2014-12-22 VITALS — BP 110/70 | HR 102 | Temp 98.5°F | Resp 18 | Ht 62.0 in | Wt 168.4 lb

## 2014-12-22 DIAGNOSIS — M542 Cervicalgia: Secondary | ICD-10-CM | POA: Diagnosis not present

## 2014-12-22 DIAGNOSIS — M5030 Other cervical disc degeneration, unspecified cervical region: Secondary | ICD-10-CM

## 2014-12-22 DIAGNOSIS — G4730 Sleep apnea, unspecified: Secondary | ICD-10-CM | POA: Diagnosis not present

## 2014-12-22 DIAGNOSIS — R51 Headache: Secondary | ICD-10-CM

## 2014-12-22 DIAGNOSIS — K219 Gastro-esophageal reflux disease without esophagitis: Secondary | ICD-10-CM

## 2014-12-22 DIAGNOSIS — G8929 Other chronic pain: Secondary | ICD-10-CM

## 2014-12-22 NOTE — Progress Notes (Signed)
Patient ID: Jennifer Hodges, female   DOB: [birth year], 69 y.o.   MRN: [id number]   Subjective:    Patient ID: Jennifer Hodges, female    DOB: June 05, 1945, 69 y.o.   MRN: [id number]  HPI  Patient with past history of hypercholesterolemia, GERD (with esophagitis) and recent bone density revealing osteopenia.  She comes in today as a work in with concerns regarding increased neck pain.  States has discussed with Dr Rudene Christians.  Has been told she has spinal stenosis.  Has limited rom in her neck.  Increased pain in her shoulders and back.  Using a walker for support.  She is aching.  Hard for her to concentrate.  Also reports pain in her knees.  Right knee worse than the left.  States has discussed with Dr Rudene Christians.  Reports he had wanted her to hold on seeing Dr Sharlet Salina until the f/u with Dr Rudene Christians.  Has f/u 01/05/15.  Has seen Dr Luiz Ochoa for her neck previously.     Past Medical History  Diagnosis Date  . Hypercholesterolemia   . Bronchiectasis (HCC)     mild  . Asthma   . GERD (gastroesophageal reflux disease)     EGD 8/09- non bleeding erosive gastritis, documentd esophageal ulcerations.   . Diverticulosis   . Hiatal hernia   . Sleep apnea     cpap asked to bring mask and tubing  . Osteoarthritis     lumbar disc disease, left hip  . Chronic headaches      followed by [facility]   Past Surgical History  Procedure Laterality Date  . Appendectomy    . Cholecystectomy    . Abdominal hysterectomy  age 35  . Right oophorectomy    . Excisional hemorrhoidectomy    . Lumbar laminectomy    . Eus N/A 05/31/2012    Procedure: UPPER ENDOSCOPIC ULTRASOUND (EUS) LINEAR;  Surgeon: [name], MD;  Location: [facility];  Service: Endoscopy;  Laterality: N/A;  . Total hip arthroplasty Left 05/01/2014    Dr. [name]  . Breast surgery Bilateral     cyst removed and reduction  . Back surgery      4th lumbar fusion  . Bladder surgery N/A     with vaginal wall repair  . Cataract  extraction w/ intraocular lens implant Bilateral 2015  . Total hip arthroplasty Right 08/05/2014    Procedure: TOTAL HIP ARTHROPLASTY ANTERIOR APPROACH;  Surgeon: [name], MD;  Location: [facility];  Service: Orthopedics;  Laterality: Right;  . Reduction mammaplasty  1990   Family History  Problem Relation Age of Onset  . Heart disease Mother     s/p stent  . Hypertension Mother   . Hypercholesterolemia Mother   . Diabetes Father   . Stomach cancer      uncle  . Breast cancer Maternal Aunt    Social History   Social History  . Marital Status: Married    Spouse Name: N/A  . Number of Children: N/A  . Years of Education: N/A   Social History Main Topics  . Smoking status: Former Smoker    Quit date: 06/13/2007  . Smokeless tobacco: Never Used  . Alcohol Use: No  . Drug Use: No  . Sexual Activity: Yes    Birth Control/ Protection: Surgical   Other Topics Concern  . None   Social History Narrative    Outpatient Encounter Prescriptions as of 12/22/2014  Medication Sig  . ASTRAGALUS PO Take  400 mg by mouth every morning.  . cetirizine (ZYRTEC) 10 MG tablet Take 10 mg by mouth daily.  . Cholecalciferol (VITAMIN D PO) Take 800 Units by mouth.  . Chromium Picolinate (CHROMIUM PICOLATE PO) Take 1 tablet by mouth every morning.  . Coenzyme Q10 (CO Q 10) 100 MG CAPS Take 100 mg by mouth every morning.   . cyclobenzaprine (FLEXERIL) 10 MG tablet Take 1 tablet (10 mg total) by mouth at bedtime as needed for muscle spasms.  . fish oil-omega-3 fatty acids 1000 MG capsule Take 1 g by mouth every morning.   . folic acid (FOLVITE) 161 MCG tablet Take 400 mcg by mouth every morning.  . Garlic 0960 MG CAPS Take 2 mg by mouth every morning.   . hyoscyamine (LEVSIN, ANASPAZ) 0.125 MG tablet Take 0.125 mg by mouth every 4 (four) hours as needed for cramping.  . magnesium gluconate (MAGONATE) 500 MG tablet Take 500 mg by mouth 2 (two) times daily.  . Multiple Vitamins-Minerals (ONE-A-DAY  WOMENS 50 PLUS PO) Take by mouth.  Jonna Coup Leaf Extract 250 MG CAPS Take by mouth every morning.  . pantoprazole (PROTONIX) 40 MG tablet TAKE 1 TABLET DAILY  . pyridOXINE (VITAMIN B-6) 100 MG tablet Take 100 mg by mouth daily.  Marland Kitchen topiramate (TOPAMAX) 25 MG tablet Take 50 mg by mouth at bedtime.   . traMADol (ULTRAM) 50 MG tablet Take 1 tablet (50 mg total) by mouth every 6 (six) hours as needed.  . vitamin B-12 (CYANOCOBALAMIN) 1000 MCG tablet Take 1,000 mcg by mouth daily.  . vitamin E (VITAMIN E) 400 UNIT capsule Take 400 Units by mouth daily.  . Vitamin Mixture (ESTER-C PO) Take 1,000 mg by mouth every morning.   . rivaroxaban (XARELTO) 10 MG TABS tablet Take 1 tablet (10 mg total) by mouth daily.   No facility-administered encounter medications on file as of 12/22/2014.    Review of Systems  Constitutional: Negative for appetite change and unexpected weight change.  HENT: Negative for congestion and sinus pressure.   Respiratory: Negative for cough, chest tightness and shortness of breath.   Cardiovascular: Negative for chest pain, palpitations and leg swelling.  Gastrointestinal: Negative for nausea, vomiting, abdominal pain and diarrhea.  Genitourinary: Negative for dysuria and difficulty urinating.  Musculoskeletal:       Increased neck and back pain.  Increased pain in her neck that extends into her back and shoulders.  Some arm pain.  Limited rom.  Pain with rotation of her head.  Not driving.    Skin: Negative for color change and rash.  Neurological: Negative for tremors and syncope.       Neck pain extends up to the base of her skull.    Psychiatric/Behavioral: Negative for dysphoric mood.       Increased frustration with the pain.        Objective:    Physical Exam  Constitutional: She appears well-developed and well-nourished. No distress.  HENT:  Nose: Nose normal.  Mouth/Throat: Oropharynx is clear and moist.  Neck: No thyromegaly present.  Increased pain to  palpation over the base of the neck.  Increased pain with rotation of her head - looking to the right and left.  Also with looking up and down.  Limited rom.    Cardiovascular: Normal rate and regular rhythm.   Pulmonary/Chest: Breath sounds normal. No respiratory distress. She has no wheezes.  Abdominal: Soft. Bowel sounds are normal. There is no tenderness.  Musculoskeletal: She exhibits  no edema or tenderness.  Motor strength appears to be equal in both arms.    Lymphadenopathy:    She has no cervical adenopathy.  Skin: No rash noted. No erythema.  Psychiatric: She has a normal mood and affect. Her behavior is normal.    BP 110/70 mmHg  Pulse 102  Temp(Src) 98.5 F (36.9 C) (Oral)  Resp 18  Ht '5\' 2"'$  (1.575 m)  Wt 168 lb 6 oz (76.374 kg)  BMI 30.79 kg/m2  SpO2 95% Wt Readings from Last 3 Encounters:  12/22/14 168 lb 6 oz (76.374 kg)  11/14/14 171 lb 4 oz (77.678 kg)  08/05/14 180 lb (81.647 kg)     Lab Results  Component Value Date   WBC 4.0 12/16/2014   HGB 13.2 12/16/2014   HCT 39.1 12/16/2014   PLT 235.0 12/16/2014   GLUCOSE 83 11/14/2014   CHOL 191 11/14/2014   TRIG 120.0 11/14/2014   HDL 49.80 11/14/2014   LDLDIRECT 147.5 11/07/2012   LDLCALC 117* 11/14/2014   ALT 12 11/14/2014   AST 19 11/14/2014   NA 144 11/14/2014   K 4.3 12/16/2014   CL 105 11/14/2014   CREATININE 0.54 11/14/2014   BUN 11 11/14/2014   CO2 30 11/14/2014   TSH 2.53 11/14/2014   INR 0.92 07/22/2014   HGBA1C 5.7 11/11/2013       Assessment & Plan:   Problem List Items Addressed This Visit    Chronic headaches    Headaches have been evaluated.  Saw Dr Manuella Ghazi.  See his note for details.  On topamax.  No acute change in headache.  Feel some of her issues now related to her neck.  Treat as outlined.        Degenerative disc disease, cervical    C-spine MRI revealed multilevel DDD.  Has seen Dr Sharlet Salina.  With increased neck, shoulder pain now.  Increased pain.  I feel this is affecting  her in other ways (her concentration, etc).  Discussed at length with her today.  We discussed referral back to Dr Sharlet Salina or Dr Luiz Ochoa.  She reports Dr Rudene Christians wanted to see her back prior to referral.  Called Dr Rudene Christians office to discuss treatment and earlier appt.  Medrol dose pack given to pt.       GERD (gastroesophageal reflux disease)    Controlled on protonix.  Avoid antiinflammatories.       Neck pain - Primary    Neck pain as outlined.  Radiates into her shoulders.  Increased pain.  States has spinal stenosis.  Will give her a trial of medrol dose pack - 6 day taper.  Called Dr Rudene Christians office.  Left message.  Needs further w/up.  Discuss with them regarding f/u and possible f/u scan.  May need to see Dr Luiz Ochoa again.  Pt agrees with the plan.        Sleep apnea    CPAP.  Avoid sedating medications.            Einar Pheasant, MD

## 2014-12-22 NOTE — Progress Notes (Signed)
Pre-visit discussion using our clinic review tool. No additional management support is needed unless otherwise documented below in the visit note.  

## 2014-12-25 ENCOUNTER — Encounter: Payer: Self-pay | Admitting: Internal Medicine

## 2014-12-25 DIAGNOSIS — M542 Cervicalgia: Secondary | ICD-10-CM | POA: Insufficient documentation

## 2014-12-25 NOTE — Assessment & Plan Note (Signed)
Neck pain as outlined.  Radiates into her shoulders.  Increased pain.  States has spinal stenosis.  Will give her a trial of medrol dose pack - 6 day taper.  Called Dr Rudene Christians office.  Left message.  Needs further w/up.  Discuss with them regarding f/u and possible f/u scan.  May need to see Dr Luiz Ochoa again.  Pt agrees with the plan.

## 2014-12-26 ENCOUNTER — Telehealth: Payer: Self-pay | Admitting: Internal Medicine

## 2014-12-26 NOTE — Telephone Encounter (Signed)
Notify pt that I did call Dr Rudene Christians office and he wants to see her Monday 12/29/14 at 10:45.  Let me know if any problems.    Dr Nicki Reaper

## 2014-12-26 NOTE — Telephone Encounter (Signed)
Pt.notified

## 2014-12-28 ENCOUNTER — Encounter: Payer: Self-pay | Admitting: Internal Medicine

## 2014-12-28 NOTE — Assessment & Plan Note (Signed)
Headaches have been evaluated.  Saw Dr Manuella Ghazi.  See his note for details.  On topamax.  No acute change in headache.  Feel some of her issues now related to her neck.  Treat as outlined.

## 2014-12-28 NOTE — Assessment & Plan Note (Signed)
C-spine MRI revealed multilevel DDD.  Has seen Dr Sharlet Salina.  With increased neck, shoulder pain now.  Increased pain.  I feel this is affecting her in other ways (her concentration, etc).  Discussed at length with her today.  We discussed referral back to Dr Sharlet Salina or Dr Luiz Ochoa.  She reports Dr Rudene Christians wanted to see her back prior to referral.  Called Dr Rudene Christians office to discuss treatment and earlier appt.  Medrol dose pack given to pt.

## 2014-12-28 NOTE — Assessment & Plan Note (Signed)
Controlled on protonix.  Avoid antiinflammatories.

## 2014-12-28 NOTE — Assessment & Plan Note (Signed)
CPAP.  Avoid sedating medications.

## 2014-12-30 ENCOUNTER — Telehealth: Payer: Self-pay | Admitting: Internal Medicine

## 2014-12-30 DIAGNOSIS — M542 Cervicalgia: Secondary | ICD-10-CM

## 2014-12-30 NOTE — Telephone Encounter (Signed)
I have placed the order for the referral.  Pt with increased neck pain.  Has been told - spinal stenosis.  Increased pain and problems now.  Needs appt asap.  Did she say why she did not see Dr Rudene Christians.

## 2014-12-30 NOTE — Telephone Encounter (Signed)
Order placed for neurosurgery referral 

## 2015-01-01 DIAGNOSIS — Z6830 Body mass index (BMI) 30.0-30.9, adult: Secondary | ICD-10-CM | POA: Diagnosis not present

## 2015-01-01 DIAGNOSIS — M4712 Other spondylosis with myelopathy, cervical region: Secondary | ICD-10-CM | POA: Diagnosis not present

## 2015-01-02 ENCOUNTER — Other Ambulatory Visit: Payer: Self-pay | Admitting: Neurosurgery

## 2015-01-02 DIAGNOSIS — M4712 Other spondylosis with myelopathy, cervical region: Secondary | ICD-10-CM

## 2015-01-13 ENCOUNTER — Ambulatory Visit
Admission: RE | Admit: 2015-01-13 | Discharge: 2015-01-13 | Disposition: A | Discharge status: Home / Self Care | Payer: Medicare Other | Source: Ambulatory Visit | Attending: Neurosurgery | Admitting: Neurosurgery

## 2015-01-13 DIAGNOSIS — M4802 Spinal stenosis, cervical region: Secondary | ICD-10-CM | POA: Diagnosis not present

## 2015-01-13 DIAGNOSIS — M4712 Other spondylosis with myelopathy, cervical region: Secondary | ICD-10-CM | POA: Insufficient documentation

## 2015-01-19 DIAGNOSIS — M4712 Other spondylosis with myelopathy, cervical region: Secondary | ICD-10-CM | POA: Diagnosis not present

## 2015-01-19 DIAGNOSIS — Z6831 Body mass index (BMI) 31.0-31.9, adult: Secondary | ICD-10-CM | POA: Diagnosis not present

## 2015-01-22 DIAGNOSIS — M1711 Unilateral primary osteoarthritis, right knee: Secondary | ICD-10-CM | POA: Diagnosis not present

## 2015-02-02 ENCOUNTER — Other Ambulatory Visit: Payer: Self-pay | Admitting: Neurosurgery

## 2015-02-10 ENCOUNTER — Telehealth: Payer: Self-pay | Admitting: *Deleted

## 2015-02-10 NOTE — Telephone Encounter (Signed)
Patient stated that she needed a approval from Dr. Nicki Reaper to have her neck surgery at [facility]. Surgery Date will be 02/24/16.

## 2015-02-11 NOTE — Telephone Encounter (Signed)
Spoke with patient , she will come at 1130.

## 2015-02-11 NOTE — Telephone Encounter (Signed)
I can see her at 10:30 tomorrow (02/12/15) - block 30 minute spot.  See if can come in then.

## 2015-02-12 ENCOUNTER — Encounter: Payer: Self-pay | Admitting: Internal Medicine

## 2015-02-12 ENCOUNTER — Ambulatory Visit (INDEPENDENT_AMBULATORY_CARE_PROVIDER_SITE_OTHER): Payer: Medicare Other | Admitting: Internal Medicine

## 2015-02-12 VITALS — BP 110/70 | HR 83 | Temp 98.4°F | Resp 18 | Ht 62.0 in | Wt 172.2 lb

## 2015-02-12 DIAGNOSIS — K219 Gastro-esophageal reflux disease without esophagitis: Secondary | ICD-10-CM | POA: Diagnosis not present

## 2015-02-12 DIAGNOSIS — J479 Bronchiectasis, uncomplicated: Secondary | ICD-10-CM | POA: Diagnosis not present

## 2015-02-12 DIAGNOSIS — R772 Abnormality of alphafetoprotein: Secondary | ICD-10-CM

## 2015-02-12 DIAGNOSIS — Z01818 Encounter for other preprocedural examination: Secondary | ICD-10-CM

## 2015-02-12 DIAGNOSIS — G4730 Sleep apnea, unspecified: Secondary | ICD-10-CM

## 2015-02-12 DIAGNOSIS — E7800 Pure hypercholesterolemia, unspecified: Secondary | ICD-10-CM

## 2015-02-12 DIAGNOSIS — R0789 Other chest pain: Secondary | ICD-10-CM

## 2015-02-12 DIAGNOSIS — M5030 Other cervical disc degeneration, unspecified cervical region: Secondary | ICD-10-CM

## 2015-02-12 DIAGNOSIS — J069 Acute upper respiratory infection, unspecified: Secondary | ICD-10-CM

## 2015-02-12 MED ORDER — AMOXICILLIN 875 MG PO TABS: 875.0000 mg | ORAL_TABLET | Freq: Two times a day (BID) | ORAL | Status: DC

## 2015-02-12 NOTE — Patient Instructions (Signed)
Saline nasal spray - flush nose at least 2-3x/day  mucinex in the am and robitussin in the evening.

## 2015-02-12 NOTE — Progress Notes (Signed)
Patient ID: Jennifer Hodges, female   DOB: 1945-07-29, 69 y.o.   MRN: [id number]   Subjective:    Patient ID: Jennifer Hodges, female    DOB: 01/29/1946, 69 y.o.   MRN: [id number]  HPI  Patient with past history of hypercholesterolemia, GERD, bronchiectasis and OA.  She comes in today to discuss upcoming neck surgery.  Her for pre op evaluation.  Has had persistent issues with her neck.  Surgery is planned for 02/24/15 at [facility].  She has noticed over the last few days some increased cough and minimal chest congestion. No sinus pressure.  No fever.  Some minimal nausea.  No vomiting.  Bowels stable.  Taking tylenol.  Has noticed some tightness in her chest since this started.  No other chest pain.  Sees cardiology - Dr Ubaldo Glassing.     Past Medical History  Diagnosis Date  . Hypercholesterolemia   . Bronchiectasis (HCC)     mild  . Asthma   . GERD (gastroesophageal reflux disease)     EGD 8/09- non bleeding erosive gastritis, documentd esophageal ulcerations.   . Diverticulosis   . Hiatal hernia   . Sleep apnea     cpap asked to bring mask and tubing  . Osteoarthritis     lumbar disc disease, left hip  . Chronic headaches      followed by [facility] migraines   Past Surgical History  Procedure Laterality Date  . Appendectomy    . Cholecystectomy    . Abdominal hysterectomy  age 63  . Right oophorectomy    . Excisional hemorrhoidectomy    . Lumbar laminectomy    . Eus N/A 05/31/2012    Procedure: UPPER ENDOSCOPIC ULTRASOUND (EUS) LINEAR;  Surgeon: [name], MD;  Location: [facility];  Service: Endoscopy;  Laterality: N/A;  . Total hip arthroplasty Left 05/01/2014    Dr. [name]  . Breast surgery Bilateral     cyst removed and reduction  . Back surgery      4th lumbar fusion  . Bladder surgery N/A     with vaginal wall repair  . Cataract extraction w/ intraocular lens implant Bilateral 2015  . Total hip arthroplasty Right 08/05/2014    Procedure: TOTAL HIP  ARTHROPLASTY ANTERIOR APPROACH;  Surgeon: [name], MD;  Location: [facility] ORS;  Service: Orthopedics;  Laterality: Right;  . Reduction mammaplasty  1990   Family History  Problem Relation Age of Onset  . Heart disease Mother     s/p stent  . Hypertension Mother   . Hypercholesterolemia Mother   . Diabetes Father   . Stomach cancer      uncle  . Breast cancer Maternal Aunt    Social History   Social History  . Marital Status: Married    Spouse Name: N/A  . Number of Children: N/A  . Years of Education: N/A   Social History Main Topics  . Smoking status: Former Smoker    Quit date: 06/13/2007  . Smokeless tobacco: Never Used  . Alcohol Use: No  . Drug Use: No  . Sexual Activity: Yes    Birth Control/ Protection: Surgical   Other Topics Concern  . None   Social History Narrative    Outpatient Encounter Prescriptions as of 02/12/2015  Medication Sig  . ASTRAGALUS PO Take 400 mg by mouth every morning.  Marland Kitchen CALCIUM-IRON-VIT D-VIT K PO Take by mouth.  . cetirizine (ZYRTEC) 10 MG tablet Take 10 mg by mouth daily.  Marland Kitchen  Cholecalciferol (VITAMIN D PO) Take 800 Units by mouth.  . Chromium Picolinate (CHROMIUM PICOLATE PO) Take 1 tablet by mouth every morning.  . Coenzyme Q10 (CO Q 10) 100 MG CAPS Take 100 mg by mouth every morning.   . cyclobenzaprine (FLEXERIL) 10 MG tablet Take 1 tablet (10 mg total) by mouth at bedtime as needed for muscle spasms.  . fish oil-omega-3 fatty acids 1000 MG capsule Take 1 g by mouth every morning.   . folic acid (FOLVITE) 427 MCG tablet Take 400 mcg by mouth every morning.  . Garlic 0623 MG CAPS Take 2 mg by mouth every morning.   . hyoscyamine (LEVSIN, ANASPAZ) 0.125 MG tablet Take 0.125 mg by mouth every 4 (four) hours as needed for cramping.  Marland Kitchen levocarnitine (CARNITOR) 250 MG capsule Take 250 mg by mouth daily.  . magnesium gluconate (MAGONATE) 500 MG tablet Take 500 mg by mouth 2 (two) times daily.  . Multiple Vitamins-Minerals (ONE-A-DAY  WOMENS 50 PLUS PO) Take by mouth.  Jonna Coup Leaf Extract 250 MG CAPS Take by mouth every morning.  . pantoprazole (PROTONIX) 40 MG tablet TAKE 1 TABLET DAILY  . pyridOXINE (VITAMIN B-6) 100 MG tablet Take 100 mg by mouth daily.  Marland Kitchen topiramate (TOPAMAX) 25 MG tablet Take 50 mg by mouth at bedtime.   . traMADol (ULTRAM) 50 MG tablet Take 1 tablet (50 mg total) by mouth every 6 (six) hours as needed.  . vitamin B-12 (CYANOCOBALAMIN) 1000 MCG tablet Take 1,000 mcg by mouth daily.  . vitamin E (VITAMIN E) 400 UNIT capsule Take 400 Units by mouth daily.  . Vitamin Mixture (ESTER-C PO) Take 1,000 mg by mouth every morning.   Marland Kitchen amoxicillin (AMOXIL) 875 MG tablet Take 1 tablet (875 mg total) by mouth 2 (two) times daily.  . [DISCONTINUED] rivaroxaban (XARELTO) 10 MG TABS tablet Take 1 tablet (10 mg total) by mouth daily.   No facility-administered encounter medications on file as of 02/12/2015.    Review of Systems  Constitutional: Negative for appetite change and unexpected weight change.  HENT: Positive for congestion. Negative for sinus pressure.   Eyes: Negative for pain and discharge.  Respiratory: Positive for cough and chest tightness. Negative for shortness of breath.   Cardiovascular: Negative for palpitations and leg swelling.  Gastrointestinal: Negative for nausea, abdominal pain and diarrhea.  Genitourinary: Negative for dysuria and difficulty urinating.  Musculoskeletal: Positive for neck pain.       Chronic right knee pain.   Skin: Negative for color change and rash.  Neurological: Negative for dizziness, light-headedness and headaches.  Hematological: Negative for adenopathy. Does not bruise/bleed easily.  Psychiatric/Behavioral: Negative for dysphoric mood and agitation.       Objective:    Physical Exam  Constitutional: She appears well-developed and well-nourished. No distress.  HENT:  Nose: Nose normal.  Mouth/Throat: Oropharynx is clear and moist.  Eyes: Conjunctivae  are normal. Right eye exhibits no discharge. Left eye exhibits no discharge.  Neck: Neck supple. No thyromegaly present.  Cardiovascular: Normal rate and regular rhythm.   Pulmonary/Chest: Breath sounds normal. No respiratory distress. She has no wheezes.  Abdominal: Soft. Bowel sounds are normal. There is no tenderness.  Musculoskeletal: She exhibits no edema or tenderness.  Lymphadenopathy:    She has no cervical adenopathy.  Skin: No rash noted. No erythema.  Psychiatric: She has a normal mood and affect. Her behavior is normal.    BP 110/70 mmHg  Pulse 83  Temp(Src) 98.4 F (36.9  C) (Oral)  Resp 18  Ht '5\' 2"'$  (1.575 m)  Wt 172 lb 4 oz (78.132 kg)  BMI 31.50 kg/m2  SpO2 95% Wt Readings from Last 3 Encounters:  02/12/15 172 lb 4 oz (78.132 kg)  01/13/15 167 lb (75.751 kg)  12/22/14 168 lb 6 oz (76.374 kg)     Lab Results  Component Value Date   WBC 4.0 12/16/2014   HGB 13.2 12/16/2014   HCT 39.1 12/16/2014   PLT 235.0 12/16/2014   GLUCOSE 83 11/14/2014   CHOL 191 11/14/2014   TRIG 120.0 11/14/2014   HDL 49.80 11/14/2014   LDLDIRECT 147.5 11/07/2012   LDLCALC 117* 11/14/2014   ALT 12 11/14/2014   AST 19 11/14/2014   NA 144 11/14/2014   K 4.3 12/16/2014   CL 105 11/14/2014   CREATININE 0.54 11/14/2014   BUN 11 11/14/2014   CO2 30 11/14/2014   TSH 2.53 11/14/2014   INR 0.92 07/22/2014   HGBA1C 5.7 11/11/2013    Mr Cervical Spine Wo Contrast  01/13/2015  CLINICAL DATA:  No known injury.  Neck pain. EXAM: MRI CERVICAL SPINE WITHOUT CONTRAST TECHNIQUE: Multiplanar, multisequence MR imaging of the cervical spine was performed. No intravenous contrast was administered. COMPARISON:  None. FINDINGS: The cervical cord is normal in size and signal. Vertebral body heights are maintained. There is severe degenerative disc disease with disc height loss at C4-5, C5-6 and C6-7. The cervical spine is normal in lordotic alignment. No static listhesis. Bone marrow signal is normal.  Cerebellar tonsils are normal in position. C2-3: No significant disc bulge. No neural foraminal stenosis. No central canal stenosis. Mild right facet arthropathy. C3-4: Mild broad-based disc bulge. Severe left facet arthropathy and left uncovertebral degenerative changes resulting in moderate left foraminal stenosis. No central canal stenosis. C4-5: Mild broad-based disc osteophyte complex. Bilateral uncovertebral degenerative changes resulting in moderate right and mild left foraminal stenosis. Mild spinal stenosis. C5-6: Mild broad-based disc osteophyte complex. Left uncovertebral degenerative change resulting in severe left foraminal stenosis. No right foraminal stenosis. Mild spinal stenosis. C6-7: Mild broad-based disc osteophyte complex with a small central disc protrusion. Moderate left foraminal stenosis. No central canal stenosis. C7-T1: No significant disc bulge. No neural foraminal stenosis. No central canal stenosis. IMPRESSION: 1. Evaluation is somewhat limited secondary to patient motion degrading image quality. Cervical spine spondylosis most significant at C3-4, C4-5, C5-6 and C6-7 as described above. Electronically Signed   By: Kathreen Devoid   On: 01/13/2015 13:09       Assessment & Plan:   Problem List Items Addressed This Visit    Bronchiectasis ([place])    Mild.  Some increased cough and congestion as outlined.  Treat with saline nasal spray as outlined.  Robitussin as directed.  Given history and upcoming surgery, treat infection.  Amoxicillin as directed.  Take probiotic.  Follow.        Degenerative disc disease, cervical    C-spine MRI as outlined in previous note.  Has seen Dr Sharlet Salina.  Now seeing neurosurgery.  Surgery planned for 02/24/15.  Will need to see cardiology prior to surgery.  Sees Dr Ubaldo Glassing.  Arrange appointment.  EKG today revealed SR with no acute ischemic changes.  Will need close intraop and post op monitoring of heart rate and blood pressure to avoid extremes.  Will  also need to have congestion/infection clear prior to surgery.       Elevated AFP    Followed by GI.  GERD (gastroesophageal reflux disease)    Continue protonix.  Upper symptoms controlled.  Avoid antiiflammatories.        Hypercholesterolemia    Low cholesterol diet and exercise.  Follow lipid panel.       Sleep apnea    Continue CPAP.        URI (upper respiratory infection)    Cough and congestion as outlined.  Has history of mild bronchiectasis.  Upcoming surgery.  Ongoing symptoms.  Treat with saline nasal spray and robitussin as directed.  Amoxicillin as outlined.  Follow.  Will need clear prior to surgery.        Other Visit Diagnoses    Pre-operative clearance    -  Primary    Relevant Orders    EKG 12-Lead (Completed)    Ambulatory referral to Cardiology    Chest tightness        Relevant Orders    EKG 12-Lead (Completed)    Ambulatory referral to Cardiology        Einar Pheasant, MD

## 2015-02-12 NOTE — Progress Notes (Signed)
Pre-visit discussion using our clinic review tool. No additional management support is needed unless otherwise documented below in the visit note.  

## 2015-02-13 ENCOUNTER — Telehealth: Payer: Self-pay | Admitting: Internal Medicine

## 2015-02-13 NOTE — Telephone Encounter (Signed)
Pt called to say that she is now using [place]  In [place].Marland Kitchen

## 2015-02-13 NOTE — Telephone Encounter (Signed)
Changed.

## 2015-02-15 ENCOUNTER — Encounter: Payer: Self-pay | Admitting: Internal Medicine

## 2015-02-15 DIAGNOSIS — J069 Acute upper respiratory infection, unspecified: Secondary | ICD-10-CM | POA: Insufficient documentation

## 2015-02-15 NOTE — Assessment & Plan Note (Signed)
Continue protonix.  Upper symptoms controlled.  Avoid antiiflammatories.

## 2015-02-15 NOTE — Assessment & Plan Note (Signed)
Followed by GI

## 2015-02-15 NOTE — Assessment & Plan Note (Signed)
C-spine MRI as outlined in previous note.  Has seen Dr Sharlet Salina.  Now seeing neurosurgery.  Surgery planned for 02/24/15.  Will need to see cardiology prior to surgery.  Sees Dr Ubaldo Glassing.  Arrange appointment.  EKG today revealed SR with no acute ischemic changes.  Will need close intraop and post op monitoring of heart rate and blood pressure to avoid extremes.  Will also need to have congestion/infection clear prior to surgery.

## 2015-02-15 NOTE — Assessment & Plan Note (Signed)
Mild.  Some increased cough and congestion as outlined.  Treat with saline nasal spray as outlined.  Robitussin as directed.  Given history and upcoming surgery, treat infection.  Amoxicillin as directed.  Take probiotic.  Follow.

## 2015-02-15 NOTE — Assessment & Plan Note (Signed)
Cough and congestion as outlined.  Has history of mild bronchiectasis.  Upcoming surgery.  Ongoing symptoms.  Treat with saline nasal spray and robitussin as directed.  Amoxicillin as outlined.  Follow.  Will need clear prior to surgery.

## 2015-02-15 NOTE — Assessment & Plan Note (Signed)
Low cholesterol diet and exercise.  Follow lipid panel.   

## 2015-02-15 NOTE — Assessment & Plan Note (Signed)
Continue CPAP.  

## 2015-02-16 ENCOUNTER — Other Ambulatory Visit: Payer: Self-pay | Admitting: Internal Medicine

## 2015-02-17 DIAGNOSIS — I10 Essential (primary) hypertension: Secondary | ICD-10-CM | POA: Diagnosis not present

## 2015-02-17 DIAGNOSIS — E7800 Pure hypercholesterolemia, unspecified: Secondary | ICD-10-CM | POA: Diagnosis not present

## 2015-02-17 DIAGNOSIS — R0789 Other chest pain: Secondary | ICD-10-CM | POA: Diagnosis not present

## 2015-02-18 ENCOUNTER — Encounter (HOSPITAL_COMMUNITY)
Admission: RE | Admit: 2015-02-18 | Discharge: 2015-02-18 | Disposition: A | Discharge status: Home / Self Care | Payer: Medicare Other | Source: Ambulatory Visit | Attending: Neurosurgery | Admitting: Neurosurgery

## 2015-02-18 ENCOUNTER — Encounter (HOSPITAL_COMMUNITY): Payer: Self-pay

## 2015-02-18 ENCOUNTER — Encounter (HOSPITAL_COMMUNITY)
Admission: RE | Admit: 2015-02-18 | Discharge: 2015-02-18 | Disposition: A | Discharge status: Home / Self Care | Payer: Medicare Other | Source: Ambulatory Visit | Attending: Anesthesiology | Admitting: Anesthesiology

## 2015-02-18 DIAGNOSIS — J479 Bronchiectasis, uncomplicated: Secondary | ICD-10-CM | POA: Insufficient documentation

## 2015-02-18 DIAGNOSIS — Z79899 Other long term (current) drug therapy: Secondary | ICD-10-CM | POA: Diagnosis not present

## 2015-02-18 DIAGNOSIS — Z01818 Encounter for other preprocedural examination: Secondary | ICD-10-CM | POA: Diagnosis not present

## 2015-02-18 DIAGNOSIS — R51 Headache: Secondary | ICD-10-CM | POA: Diagnosis not present

## 2015-02-18 DIAGNOSIS — J449 Chronic obstructive pulmonary disease, unspecified: Secondary | ICD-10-CM | POA: Diagnosis not present

## 2015-02-18 DIAGNOSIS — K219 Gastro-esophageal reflux disease without esophagitis: Secondary | ICD-10-CM | POA: Diagnosis not present

## 2015-02-18 DIAGNOSIS — R059 Cough, unspecified: Secondary | ICD-10-CM

## 2015-02-18 DIAGNOSIS — J45909 Unspecified asthma, uncomplicated: Secondary | ICD-10-CM | POA: Diagnosis not present

## 2015-02-18 DIAGNOSIS — G4733 Obstructive sleep apnea (adult) (pediatric): Secondary | ICD-10-CM | POA: Diagnosis not present

## 2015-02-18 DIAGNOSIS — R05 Cough: Secondary | ICD-10-CM | POA: Diagnosis not present

## 2015-02-18 DIAGNOSIS — Z01812 Encounter for preprocedural laboratory examination: Secondary | ICD-10-CM | POA: Insufficient documentation

## 2015-02-18 DIAGNOSIS — Z87891 Personal history of nicotine dependence: Secondary | ICD-10-CM | POA: Insufficient documentation

## 2015-02-18 DIAGNOSIS — M4722 Other spondylosis with radiculopathy, cervical region: Secondary | ICD-10-CM | POA: Diagnosis not present

## 2015-02-18 DIAGNOSIS — E7800 Pure hypercholesterolemia, unspecified: Secondary | ICD-10-CM | POA: Insufficient documentation

## 2015-02-18 DIAGNOSIS — R509 Fever, unspecified: Secondary | ICD-10-CM | POA: Insufficient documentation

## 2015-02-18 DIAGNOSIS — E119 Type 2 diabetes mellitus without complications: Secondary | ICD-10-CM | POA: Insufficient documentation

## 2015-02-18 HISTORY — DX: Weakness: R53.1

## 2015-02-18 HISTORY — DX: Nausea with vomiting, unspecified: R11.2

## 2015-02-18 HISTORY — DX: Chronic obstructive pulmonary disease, unspecified: J44.9

## 2015-02-18 HISTORY — DX: Other specified postprocedural states: Z98.890

## 2015-02-18 HISTORY — DX: Calculus of gallbladder without cholecystitis without obstruction: K80.20

## 2015-02-18 HISTORY — DX: Family history of other specified conditions: Z84.89

## 2015-02-18 HISTORY — DX: Type 2 diabetes mellitus without complications: E11.9

## 2015-02-18 HISTORY — DX: Personal history of pneumonia (recurrent): Z87.01

## 2015-02-18 LAB — CBC
HCT: 41.8 % (ref 36.0–46.0)
Hemoglobin: 13.8 g/dL (ref 12.0–15.0)
MCH: 30.6 pg (ref 26.0–34.0)
MCHC: 33 g/dL (ref 30.0–36.0)
MCV: 92.7 fL (ref 78.0–100.0)
Platelets: 264 10*3/uL (ref 150–400)
RBC: 4.51 MIL/uL (ref 3.87–5.11)
RDW: 13.4 % (ref 11.5–15.5)
WBC: 6.5 10*3/uL (ref 4.0–10.5)

## 2015-02-18 LAB — BASIC METABOLIC PANEL
Anion gap: 8 (ref 5–15)
BUN: 8 mg/dL (ref 6–20)
CO2: 29 mmol/L (ref 22–32)
Calcium: 9.5 mg/dL (ref 8.9–10.3)
Chloride: 103 mmol/L (ref 101–111)
Creatinine, Ser: 0.55 mg/dL (ref 0.44–1.00)
GFR calc Af Amer: >60 mL/min (ref >60)
GFR calc non Af Amer: >60 mL/min (ref >60)
Glucose, Bld: 104 mg/dL — ABNORMAL HIGH (ref 65–99)
Potassium: 4.3 mmol/L (ref 3.5–5.1)
Sodium: 140 mmol/L (ref 135–145)

## 2015-02-18 LAB — SURGICAL PCR SCREEN
MRSA, PCR: NEGATIVE
Staphylococcus aureus: NEGATIVE

## 2015-02-18 NOTE — Pre-Procedure Instructions (Signed)
Jennifer Hodges  02/18/2015      [facility] - [place], [place] [address] [place] [address] Phone: [phone] Fax: [phone]  [id number] - [place], [place] - [facility] [address] AT [place] [facility] [place] [address] Phone: [phone] Fax: [phone]    Your procedure is scheduled on Tues, Dec 13 @ 2:30 PM  Report to [facility] Admitting at 11:30 AM  Call this number if you have problems the morning of surgery:  [phone]   Remember:  Do not eat food or drink liquids after midnight.  Take these medicines the morning of surgery with A SIP OF WATER Mucinex(Guaifenesin),Protonix(Pantoprazole),and Tramadol(Ultram-if needed)               Stop taking your CO Q 10 along with any Vitamins or Herbal Medications. No Goody's,BC's,Aleve,Aspirin,Ibuprofen,Motrin,Advil,or Fish Oil.   Do not wear jewelry, make-up or nail polish.  Do not wear lotions, powders, or perfumes.  You may wear deodorant.  Do not shave 48 hours prior to surgery.    Do not bring valuables to the hospital.  [facility] is not responsible for any belongings or valuables.  Contacts, dentures or bridgework may not be worn into surgery.  Leave your suitcase in the car.  After surgery it may be brought to your room.  For patients admitted to the hospital, discharge time will be determined by your treatment team.  Patients discharged the day of surgery will not be allowed to drive home.   Special instructions:  [place] - Preparing for Surgery  Before surgery, you can play an important role.  Because skin is not sterile, your skin needs to be as free of germs as possible.  You can reduce the number of germs on you skin by washing with CHG (chlorahexidine gluconate) soap before surgery.  CHG is an antiseptic cleaner which kills germs and bonds with the skin to continue killing germs even after  washing.  Please DO NOT use if you have an allergy to CHG or antibacterial soaps.  If your skin becomes reddened/irritated stop using the CHG and inform your nurse when you arrive at Short Stay.  Do not shave (including legs and underarms) for at least 48 hours prior to the first CHG shower.  You may shave your face.  Please follow these instructions carefully:   1.  Shower with CHG Soap the night before surgery and the                                morning of Surgery.  2.  If you choose to wash your hair, wash your hair first as usual with your       normal shampoo.  3.  After you shampoo, rinse your hair and body thoroughly to remove the                      Shampoo.  4.  Use CHG as you would any other liquid soap.  You can apply chg directly       to the skin and wash gently with scrungie or a clean washcloth.  5.  Apply the CHG Soap to your body ONLY FROM THE NECK DOWN.        Do not use on open wounds or open sores.  Avoid contact with your eyes,       ears, mouth and genitals (private parts).  Wash genitals (private parts)       with your normal soap.  6.  Wash thoroughly, paying special attention to the area where your surgery        will be performed.  7.  Thoroughly rinse your body with warm water from the neck down.  8.  DO NOT shower/wash with your normal soap after using and rinsing off       the CHG Soap.  9.  Pat yourself dry with a clean towel.            10.  Wear clean pajamas.            11.  Place clean sheets on your bed the night of your first shower and do not        sleep with pets.  Day of Surgery  Do not apply any lotions/deoderants the morning of surgery.  Please wear clean clothes to the hospital/surgery center.    Please read over the following fact sheets that you were given. Pain Booklet, Coughing and Deep Breathing, MRSA Information and Surgical Site Infection Prevention

## 2015-02-18 NOTE — Progress Notes (Signed)
PCP - Dr. Einar Pheasant Cardiologist - Dr. Ubaldo Glassing  EKG - 02/12/15 CXR - 02/18/15  Echo - 2014 Stress test - 2014 Cardiac Cath - 2014  Patient states that she has recently had a cough with a fever and was started on Amoxicillin on 12/1 for a 10 day course.    Patient denies chest pain and shortness of breath at PAT appointment.

## 2015-02-19 ENCOUNTER — Telehealth: Payer: Self-pay | Admitting: Internal Medicine

## 2015-02-19 ENCOUNTER — Encounter: Payer: Self-pay | Admitting: Nurse Practitioner

## 2015-02-19 ENCOUNTER — Ambulatory Visit (INDEPENDENT_AMBULATORY_CARE_PROVIDER_SITE_OTHER): Payer: Medicare Other | Admitting: Nurse Practitioner

## 2015-02-19 VITALS — BP 116/72 | HR 76 | Temp 97.6°F | Resp 14 | Ht 62.0 in | Wt 175.2 lb

## 2015-02-19 DIAGNOSIS — J479 Bronchiectasis, uncomplicated: Secondary | ICD-10-CM

## 2015-02-19 DIAGNOSIS — R55 Syncope and collapse: Secondary | ICD-10-CM

## 2015-02-19 LAB — HEMOGLOBIN A1C
Hgb A1c MFr Bld: 5.7 % — ABNORMAL HIGH (ref 4.8–5.6)
Mean Plasma Glucose: 117 mg/dL

## 2015-02-19 NOTE — Assessment & Plan Note (Signed)
New onset. Quick onset with quick resolution. Informed Dr. Bethanne Ginger nurse Jeannene Patella and she would let him know. The symptoms were not reproduced on exam today and her neuro exam is stable. She is using her rolling walker just in case of another episode. EKG was NSR and reviewed all available office notes since 02/12/15. She does report worsening radiculopathy of left side. Will forward notes to Myra Gianotti, PA-C for consideration pre-surgery.   Asked pt to not repeat the motions of neck extension and forward reaching and to let us know if pre-syncope happens again.

## 2015-02-19 NOTE — Assessment & Plan Note (Signed)
Improved

## 2015-02-19 NOTE — Progress Notes (Signed)
Anesthesia Chart Review: Patient is a 68 year old female scheduled for C4-5, C5-6, C6-7 ACDF on 02/24/15 by Dr. Kathyrn Sheriff.   History includes former smoker (quit '09), post-operative N/V (Morphine in particular), hypercholesterolemia, bronchiectasis, asthma, GERD, hiatal hernia, chronic headaches, weakness (right side), DM2 (no longer requiring meds; A1C normal), COPD, OSA with CPAP, cholecystectomy, appendectomy, hysterectomy, reduction mammaplasty, back surgery, bilateral THA '16. BMI is consistent with obesity. She was started on Amoxicillin for URI on 02/12/15.   PCP is Dr. Einar Pheasant. She had a work-in appointment today with Lorane Gell, NP for evaluation of syncope/near syncope this morning. She looked up to place a dish on the top shelf, felt a wave of pressure, nausea, had occipital headache and tunnel vision. Symptoms came on fast and went away quickly without intervention. Neuro exam was stable. Symptoms were not reproducible on her visit. Morey Hummingbird did notify Dr. Bethanne Ginger nurse of today's episode in case he felt like he needed to re-evaluate her again prior to surgery. Patient was asked not to repeat motions of neck extension and forward reaching for now and to call if recurrent symptoms. She did report that her cold symptoms were improving.  Cardiologist is Dr. Ubaldo Glassing, seen 02/17/15 for pre-operative evaluation. He felt patient was low risk from a cardiac standpoint. Recommend routine cardiac monitoring.   Meds include Amoxicillin X 10 days (started 02/12/15), Levsin, magnesium gluconate, Protonix, Topamax, tramadol.   02/12/15 EKG: NSR.  11/26/12 Cardiac cath [facility]): Normal coronaries. No LV global or regional wall motion abnormalities.   10/16/12 Stress echo:  Normal treadmill ECG without evidence of ischemia or dysrhythmia. Excellent exercise tolerance for age. Normal stress echocardiographic images without evidence of ischemia.  01/13/15 C-spine MRI: IMPRESSION: 1. Evaluation is somewhat  limited secondary to patient motion degrading image quality. Cervical spine spondylosis most significant at C3-4, C4-5, C5-6 and C6-7  02/18/15 CXR: IMPRESSION: No edema or consolidation.  Preoperative labs noted. A1C 5.7.   Reviewed above with anesthesiologist Dr. Kalman Shan including this morning's evaluation for near syncope. Dr. Bethanne Ginger office has also been notified by Flonnie Overman, NP. If no new/recurrent issues and URI symptoms continue to improve then would anticipate that she could proceed as planned.  George Hugh [facility] Short Stay Center/Anesthesiology Phone [phone] 02/19/2015 3:52 PM

## 2015-02-19 NOTE — Patient Instructions (Signed)
We will contact you if Dr. Ubaldo Glassing wants to see you before next week. Don't look up or reach up in the meantime and call us back if you have the symptoms again.

## 2015-02-19 NOTE — Telephone Encounter (Signed)
Patient has appointment with you at 1130. thanks

## 2015-02-19 NOTE — Progress Notes (Signed)
Patient ID: Jennifer Hodges, female    DOB: 20-Apr-1945  Age: 69 y.o. MRN: [id number]  CC: Loss of Consciousness   HPI JAIMI Hodges presents for CC of syncopal episode this morning.   1) Just before 9:30 am this morning  Fixed breakfast then was unloading dishes and placing some on the top shelf of her pantry.  Looked up and wash reaching up- felt a wave of pressure, vision went into tunnel vision, denies hearing changes. The headache was occipital then went to front of head and then went away. She reports this was fast and went away quickly without intervention. This did concern her and she did have some nausea which has now resolved. She has been using her walker today for balance in case of recurrence.   Nerve pain woke her up around 5 am with radiculopathy to fingers. Pt reports all fingers and down her arm. Pain has been worsening in the left arm over the past 3 days and is intermittent.   2) Cold symptoms- she saw Dr. Nicki Reaper on 02/12/15 with a cough and chest tightness. Today she feels improved.   History Laverle has a past medical history of Hypercholesterolemia; Bronchiectasis ([place]); Asthma; GERD (gastroesophageal reflux disease); Diverticulosis; Hiatal hernia; Osteoarthritis; Chronic headaches; PONV (postoperative nausea and vomiting); Family history of adverse reaction to anesthesia; COPD (chronic obstructive pulmonary disease) ([place]); History of pneumonia; Diabetes mellitus without complication ([place]); Sleep apnea; Weakness of right side of body; and Gall stones.   She has past surgical history that includes Appendectomy; Cholecystectomy; Abdominal hysterectomy (age 26); Right oophorectomy; Excisional hemorrhoidectomy; Lumbar laminectomy; EUS (N/A, 05/31/2012); Total hip arthroplasty (Left, 05/01/2014); Breast surgery (Bilateral); Back surgery; Bladder surgery (N/A); Cataract extraction w/ intraocular lens implant (Bilateral, 2015); Total hip arthroplasty (Right, 08/05/2014); Reduction  mammaplasty (1990); and Cardiac catheterization (2014).   Her family history includes Breast cancer in her maternal aunt; Diabetes in her father; Heart disease in her mother; Hypercholesterolemia in her mother; Hypertension in her mother; Stomach cancer in an other family member.She reports that she quit smoking about 7 years ago. She has never used smokeless tobacco. She reports that she does not drink alcohol or use illicit drugs.  Outpatient Prescriptions Prior to Visit  Medication Sig Dispense Refill  . acetaminophen (TYLENOL) 500 MG tablet Take 500 mg by mouth every 8 (eight) hours as needed for mild pain or moderate pain.    Marland Kitchen amoxicillin (AMOXIL) 875 MG tablet Take 1 tablet (875 mg total) by mouth 2 (two) times daily. 20 tablet 0  . Cholecalciferol (VITAMIN D PO) Take 800 Units by mouth.    . Chromium Picolinate (CHROMIUM PICOLATE PO) Take 1 tablet by mouth every morning.    . Coenzyme Q10 (CO Q 10) 100 MG CAPS Take 100 mg by mouth every morning.     . Garlic 1856 MG CAPS Take 2 capsules by mouth every morning.     Marland Kitchen guaiFENesin (MUCINEX) 600 MG 12 hr tablet Take 600 mg by mouth daily.    . hyoscyamine (LEVSIN, ANASPAZ) 0.125 MG tablet Take 0.125 mg by mouth every 4 (four) hours as needed for cramping.    . magnesium gluconate (MAGONATE) 500 MG tablet Take 500 mg by mouth 2 (two) times daily.    . pantoprazole (PROTONIX) 40 MG tablet TAKE 1 TABLET DAILY 90 tablet 3  . topiramate (TOPAMAX) 25 MG tablet Take 25 mg by mouth at bedtime.     . traMADol (ULTRAM) 50 MG tablet Take 1 tablet (50  mg total) by mouth every 6 (six) hours as needed. 60 tablet 0   No facility-administered medications prior to visit.    ROS Review of Systems  Constitutional: Negative for fever, chills, diaphoresis and fatigue.  Respiratory: Negative for chest tightness, shortness of breath and wheezing.   Cardiovascular: Negative for chest pain, palpitations and leg swelling.  Gastrointestinal: Negative for nausea,  vomiting and diarrhea.  Skin: Negative for rash.  Neurological: Negative for dizziness, weakness, numbness and headaches.       Pre-syncope  Psychiatric/Behavioral: The patient is not nervous/anxious.     Objective:  BP 116/72 mmHg  Pulse 76  Temp(Src) 97.6 F (36.4 C)  Resp 14  Ht '5\' 2"'$  (1.575 m)  Wt 175 lb 3.2 oz (79.47 kg)  BMI 32.04 kg/m2  SpO2 95%  Physical Exam  Constitutional: She is oriented to person, place, and time. She appears well-developed and well-nourished. No distress.  HENT:  Head: Normocephalic and atraumatic.  Right Ear: External ear normal.  Left Ear: External ear normal.  Cardiovascular: Normal rate, regular rhythm and normal heart sounds.  Exam reveals no gallop and no friction rub.   No murmur heard. Pulmonary/Chest: Effort normal and breath sounds normal. No respiratory distress. She has no wheezes. She has no rales. She exhibits no tenderness.  Abdominal: There is no CVA tenderness.  Neurological: She is alert and oriented to person, place, and time. No cranial nerve deficit. She exhibits normal muscle tone. Coordination normal.  Symptoms were not reproducible with neck extension and forward reaching  Skin: Skin is warm and dry. No rash noted. She is not diaphoretic.  Psychiatric: She has a normal mood and affect. Her behavior is normal. Judgment and thought content normal.    Assessment & Plan:   Maycee was seen today for loss of consciousness.  Diagnoses and all orders for this visit:  Bronchiectasis without complication ([place])  I am having Ms. Yakubov maintain her hyoscyamine, topiramate, Garlic, Co Q 10, Cholecalciferol (VITAMIN D PO), traMADol, Chromium Picolinate (CHROMIUM PICOLATE PO), magnesium gluconate, amoxicillin, pantoprazole, acetaminophen, and guaiFENesin.  No orders of the defined types were placed in this encounter.     Follow-up: Return if symptoms worsen or fail to improve.

## 2015-02-19 NOTE — Telephone Encounter (Signed)
Guidelines    Guideline Title Affirmed Question Affirmed Notes  Fainting [1] Age > 50 years AND [2] now alert and feels fine    Final Disposition User   Go to ED Now (or PCP triage) Orvan Seen, RN, [place]    Referrals  REFERRED TO PCP OFFICE   Disagree/Comply: Comply

## 2015-02-19 NOTE — Progress Notes (Signed)
Pre visit review using our clinic review tool, if applicable. No additional management support is needed unless otherwise documented below in the visit note. 

## 2015-02-24 ENCOUNTER — Telehealth: Payer: Self-pay | Admitting: *Deleted

## 2015-02-24 ENCOUNTER — Inpatient Hospital Stay (HOSPITAL_COMMUNITY): Payer: Medicare Other | Admitting: Certified Registered"

## 2015-02-24 ENCOUNTER — Encounter (HOSPITAL_COMMUNITY)
Admission: RE | Disposition: A | Discharge status: Home / Self Care | Payer: Self-pay | Source: Ambulatory Visit | Attending: Neurosurgery

## 2015-02-24 ENCOUNTER — Inpatient Hospital Stay (HOSPITAL_COMMUNITY): Payer: Medicare Other

## 2015-02-24 ENCOUNTER — Inpatient Hospital Stay (HOSPITAL_COMMUNITY)
Admission: RE | Admit: 2015-02-24 | Discharge: 2015-02-25 | DRG: 473 | Disposition: A | Discharge status: Home / Self Care | Payer: Medicare Other | Source: Ambulatory Visit | Attending: Neurosurgery | Admitting: Neurosurgery

## 2015-02-24 ENCOUNTER — Inpatient Hospital Stay (HOSPITAL_COMMUNITY): Payer: Medicare Other | Admitting: Vascular Surgery

## 2015-02-24 ENCOUNTER — Encounter (HOSPITAL_COMMUNITY): Payer: Self-pay | Admitting: Certified Registered"

## 2015-02-24 DIAGNOSIS — Z87891 Personal history of nicotine dependence: Secondary | ICD-10-CM

## 2015-02-24 DIAGNOSIS — E119 Type 2 diabetes mellitus without complications: Secondary | ICD-10-CM | POA: Diagnosis present

## 2015-02-24 DIAGNOSIS — Z419 Encounter for procedure for purposes other than remedying health state, unspecified: Secondary | ICD-10-CM

## 2015-02-24 DIAGNOSIS — M1990 Unspecified osteoarthritis, unspecified site: Secondary | ICD-10-CM | POA: Diagnosis present

## 2015-02-24 DIAGNOSIS — E7800 Pure hypercholesterolemia, unspecified: Secondary | ICD-10-CM | POA: Diagnosis present

## 2015-02-24 DIAGNOSIS — K219 Gastro-esophageal reflux disease without esophagitis: Secondary | ICD-10-CM | POA: Diagnosis present

## 2015-02-24 DIAGNOSIS — M4322 Fusion of spine, cervical region: Secondary | ICD-10-CM | POA: Diagnosis not present

## 2015-02-24 DIAGNOSIS — J449 Chronic obstructive pulmonary disease, unspecified: Secondary | ICD-10-CM | POA: Diagnosis present

## 2015-02-24 DIAGNOSIS — M4802 Spinal stenosis, cervical region: Secondary | ICD-10-CM | POA: Diagnosis present

## 2015-02-24 DIAGNOSIS — M4722 Other spondylosis with radiculopathy, cervical region: Principal | ICD-10-CM | POA: Diagnosis present

## 2015-02-24 DIAGNOSIS — G4730 Sleep apnea, unspecified: Secondary | ICD-10-CM | POA: Diagnosis present

## 2015-02-24 DIAGNOSIS — Z96643 Presence of artificial hip joint, bilateral: Secondary | ICD-10-CM | POA: Diagnosis present

## 2015-02-24 HISTORY — PX: ANTERIOR CERVICAL DECOMP/DISCECTOMY FUSION: SHX1161

## 2015-02-24 LAB — GLUCOSE, CAPILLARY
Glucose-Capillary: 145 mg/dL — ABNORMAL HIGH (ref 65–99)
Glucose-Capillary: 186 mg/dL — ABNORMAL HIGH (ref 65–99)

## 2015-02-24 SURGERY — ANTERIOR CERVICAL DECOMPRESSION/DISCECTOMY FUSION 3 LEVELS
Anesthesia: General | Site: Spine Cervical

## 2015-02-24 MED ORDER — ACETAMINOPHEN 160 MG/5ML PO SOLN
325.0000 mg | ORAL | Status: DC | PRN
  Filled 2015-02-24: qty 20.3

## 2015-02-24 MED ORDER — SODIUM CHLORIDE 0.9 % IR SOLN
Status: DC | PRN
  Administered 2015-02-24: 12:00:00

## 2015-02-24 MED ORDER — SUFENTANIL CITRATE 50 MCG/ML IV SOLN
INTRAVENOUS | Status: AC
  Filled 2015-02-24: qty 1

## 2015-02-24 MED ORDER — PHENOL 1.4 % MT LIQD: 1.0000 | OROMUCOSAL | Status: DC | PRN

## 2015-02-24 MED ORDER — SODIUM CHLORIDE 0.9 % IV SOLN: 250.0000 mL | INTRAVENOUS | Status: DC

## 2015-02-24 MED ORDER — MAGNESIUM GLUCONATE 500 MG PO TABS: 500.0000 mg | ORAL_TABLET | Freq: Two times a day (BID) | ORAL | Status: DC

## 2015-02-24 MED ORDER — PHENYLEPHRINE 40 MCG/ML (10ML) SYRINGE FOR IV PUSH (FOR BLOOD PRESSURE SUPPORT)
PREFILLED_SYRINGE | INTRAVENOUS | Status: AC
  Filled 2015-02-24: qty 10

## 2015-02-24 MED ORDER — ROCURONIUM BROMIDE 100 MG/10ML IV SOLN
INTRAVENOUS | Status: DC | PRN
  Administered 2015-02-24: 40 mg via INTRAVENOUS

## 2015-02-24 MED ORDER — FENTANYL CITRATE (PF) 100 MCG/2ML IJ SOLN
25.0000 ug | INTRAMUSCULAR | Status: DC | PRN
  Administered 2015-02-24: 50 ug via INTRAVENOUS
  Administered 2015-02-24: 25 ug via INTRAVENOUS

## 2015-02-24 MED ORDER — SODIUM CHLORIDE 0.9 % IJ SOLN
3.0000 mL | Freq: Two times a day (BID) | INTRAMUSCULAR | Status: DC
  Administered 2015-02-24: 3 mL via INTRAVENOUS

## 2015-02-24 MED ORDER — SODIUM CHLORIDE 0.9 % IV SOLN
INTRAVENOUS | Status: DC
  Administered 2015-02-25: 07:00:00 via INTRAVENOUS

## 2015-02-24 MED ORDER — DEXAMETHASONE SODIUM PHOSPHATE 10 MG/ML IJ SOLN
INTRAMUSCULAR | Status: AC
  Administered 2015-02-24: 10 mg via INTRAVENOUS
  Filled 2015-02-24: qty 1

## 2015-02-24 MED ORDER — PANTOPRAZOLE SODIUM 40 MG PO TBEC
40.0000 mg | DELAYED_RELEASE_TABLET | Freq: Every day | ORAL | Status: DC
  Administered 2015-02-25: 40 mg via ORAL
  Filled 2015-02-24: qty 1

## 2015-02-24 MED ORDER — LIDOCAINE HCL (CARDIAC) 20 MG/ML IV SOLN
INTRAVENOUS | Status: DC | PRN
  Administered 2015-02-24: 70 mg via INTRAVENOUS

## 2015-02-24 MED ORDER — ACETAMINOPHEN 325 MG PO TABS
650.0000 mg | ORAL_TABLET | ORAL | Status: DC | PRN
  Administered 2015-02-25: 650 mg via ORAL
  Filled 2015-02-24: qty 2

## 2015-02-24 MED ORDER — DIPHENHYDRAMINE HCL 50 MG/ML IJ SOLN
INTRAMUSCULAR | Status: DC | PRN
  Administered 2015-02-24: 10 mg via INTRAVENOUS

## 2015-02-24 MED ORDER — MIDAZOLAM HCL 2 MG/2ML IJ SOLN
INTRAMUSCULAR | Status: AC
  Filled 2015-02-24: qty 2

## 2015-02-24 MED ORDER — SUFENTANIL CITRATE 50 MCG/ML IV SOLN
INTRAVENOUS | Status: DC | PRN
  Administered 2015-02-24: 10 ug via INTRAVENOUS
  Administered 2015-02-24: 5 ug via INTRAVENOUS
  Administered 2015-02-24 (×2): 10 ug via INTRAVENOUS

## 2015-02-24 MED ORDER — OXYCODONE-ACETAMINOPHEN 5-325 MG PO TABS
1.0000 | ORAL_TABLET | ORAL | Status: DC | PRN
  Filled 2015-02-24: qty 2

## 2015-02-24 MED ORDER — TOPIRAMATE 25 MG PO TABS
25.0000 mg | ORAL_TABLET | Freq: Every day | ORAL | Status: DC
  Filled 2015-02-24: qty 1

## 2015-02-24 MED ORDER — ACETAMINOPHEN 650 MG RE SUPP: 650.0000 mg | RECTAL | Status: DC | PRN

## 2015-02-24 MED ORDER — CEFAZOLIN SODIUM-DEXTROSE 2-3 GM-% IV SOLR
2.0000 g | Freq: Three times a day (TID) | INTRAVENOUS | Status: AC
Start: 2015-02-24 — End: 2015-02-25
  Administered 2015-02-24 – 2015-02-25 (×2): 2 g via INTRAVENOUS
  Filled 2015-02-24 (×2): qty 50

## 2015-02-24 MED ORDER — PROMETHAZINE HCL 25 MG/ML IJ SOLN: 6.2500 mg | Freq: Once | INTRAMUSCULAR | Status: DC

## 2015-02-24 MED ORDER — FENTANYL CITRATE (PF) 100 MCG/2ML IJ SOLN
INTRAMUSCULAR | Status: AC
  Administered 2015-02-24: 25 ug via INTRAVENOUS
  Filled 2015-02-24: qty 2

## 2015-02-24 MED ORDER — HYOSCYAMINE SULFATE 0.125 MG PO TABS
0.1250 mg | ORAL_TABLET | ORAL | Status: DC | PRN
  Filled 2015-02-24: qty 1

## 2015-02-24 MED ORDER — PROMETHAZINE HCL 25 MG/ML IJ SOLN
INTRAMUSCULAR | Status: AC
  Administered 2015-02-24: 6.25 mg via INTRAVENOUS
  Filled 2015-02-24: qty 1

## 2015-02-24 MED ORDER — MENTHOL 3 MG MT LOZG: 1.0000 | LOZENGE | OROMUCOSAL | Status: DC | PRN

## 2015-02-24 MED ORDER — SURGIFOAM 100 EX MISC
CUTANEOUS | Status: DC | PRN
  Administered 2015-02-24: 13:00:00 via TOPICAL

## 2015-02-24 MED ORDER — SENNA 8.6 MG PO TABS
1.0000 | ORAL_TABLET | Freq: Two times a day (BID) | ORAL | Status: DC
  Administered 2015-02-25: 8.6 mg via ORAL
  Filled 2015-02-24 (×2): qty 1

## 2015-02-24 MED ORDER — SODIUM CHLORIDE 0.9 % IJ SOLN: 3.0000 mL | INTRAMUSCULAR | Status: DC | PRN

## 2015-02-24 MED ORDER — HYDROMORPHONE HCL 1 MG/ML IJ SOLN
0.5000 mg | INTRAMUSCULAR | Status: DC | PRN
  Administered 2015-02-24 – 2015-02-25 (×5): 1 mg via INTRAVENOUS
  Filled 2015-02-24 (×4): qty 1

## 2015-02-24 MED ORDER — ROCURONIUM BROMIDE 50 MG/5ML IV SOLN
INTRAVENOUS | Status: AC
  Filled 2015-02-24: qty 2

## 2015-02-24 MED ORDER — ACETAMINOPHEN 10 MG/ML IV SOLN
INTRAVENOUS | Status: AC
  Administered 2015-02-24: 1000 mg via INTRAVENOUS
  Filled 2015-02-24: qty 100

## 2015-02-24 MED ORDER — CEFAZOLIN SODIUM-DEXTROSE 2-3 GM-% IV SOLR
2.0000 g | INTRAVENOUS | Status: AC
  Administered 2015-02-24: 2 g via INTRAVENOUS
  Filled 2015-02-24: qty 50

## 2015-02-24 MED ORDER — LIDOCAINE-EPINEPHRINE 1 %-1:100000 IJ SOLN
INTRAMUSCULAR | Status: DC | PRN
  Administered 2015-02-24: 3 mL

## 2015-02-24 MED ORDER — PHENYLEPHRINE HCL 10 MG/ML IJ SOLN
INTRAMUSCULAR | Status: DC | PRN
  Administered 2015-02-24: 40 ug via INTRAVENOUS

## 2015-02-24 MED ORDER — VITAMIN D 1000 UNITS PO TABS: 1000.0000 [IU] | ORAL_TABLET | Freq: Every day | ORAL | Status: DC

## 2015-02-24 MED ORDER — THROMBIN 5000 UNITS EX SOLR
OROMUCOSAL | Status: DC | PRN
  Administered 2015-02-24: 13:00:00 via TOPICAL

## 2015-02-24 MED ORDER — PROMETHAZINE HCL 25 MG/ML IJ SOLN
6.2500 mg | Freq: Once | INTRAMUSCULAR | Status: AC
  Administered 2015-02-24: 6.25 mg via INTRAVENOUS

## 2015-02-24 MED ORDER — ACETAMINOPHEN 325 MG PO TABS: 325.0000 mg | ORAL_TABLET | ORAL | Status: DC | PRN

## 2015-02-24 MED ORDER — GUAIFENESIN ER 600 MG PO TB12
600.0000 mg | ORAL_TABLET | Freq: Every day | ORAL | Status: DC
  Filled 2015-02-24: qty 1

## 2015-02-24 MED ORDER — GARLIC 1000 MG PO CAPS: 2.0000 | ORAL_CAPSULE | ORAL | Status: DC

## 2015-02-24 MED ORDER — OXYCODONE HCL 5 MG PO TABS: 5.0000 mg | ORAL_TABLET | Freq: Once | ORAL | Status: DC | PRN

## 2015-02-24 MED ORDER — CO Q 10 100 MG PO CAPS: 100.0000 mg | ORAL_CAPSULE | ORAL | Status: DC

## 2015-02-24 MED ORDER — PROPOFOL 10 MG/ML IV BOLUS
INTRAVENOUS | Status: AC
  Filled 2015-02-24: qty 20

## 2015-02-24 MED ORDER — BUPIVACAINE HCL 0.5 % IJ SOLN
INTRAMUSCULAR | Status: DC | PRN
  Administered 2015-02-24: 3 mL

## 2015-02-24 MED ORDER — CHROMIUM PICOLATE 1000 MCG PO TABS: ORAL_TABLET | ORAL | Status: DC

## 2015-02-24 MED ORDER — LACTATED RINGERS IV SOLN
INTRAVENOUS | Status: DC
  Administered 2015-02-24 (×2): via INTRAVENOUS

## 2015-02-24 MED ORDER — PROPOFOL 10 MG/ML IV BOLUS
INTRAVENOUS | Status: DC | PRN
  Administered 2015-02-24: 140 mg via INTRAVENOUS

## 2015-02-24 MED ORDER — ONDANSETRON HCL 4 MG/2ML IJ SOLN
4.0000 mg | INTRAMUSCULAR | Status: DC | PRN
  Administered 2015-02-24 – 2015-02-25 (×3): 4 mg via INTRAVENOUS
  Filled 2015-02-24 (×3): qty 2

## 2015-02-24 MED ORDER — DOCUSATE SODIUM 100 MG PO CAPS
100.0000 mg | ORAL_CAPSULE | Freq: Two times a day (BID) | ORAL | Status: DC
  Filled 2015-02-24 (×2): qty 1

## 2015-02-24 MED ORDER — ONDANSETRON HCL 4 MG/2ML IJ SOLN
INTRAMUSCULAR | Status: AC
  Filled 2015-02-24: qty 2

## 2015-02-24 MED ORDER — ONDANSETRON HCL 4 MG/2ML IJ SOLN
INTRAMUSCULAR | Status: DC | PRN
  Administered 2015-02-24: 4 mg via INTRAVENOUS

## 2015-02-24 MED ORDER — BISACODYL 10 MG RE SUPP: 10.0000 mg | Freq: Every day | RECTAL | Status: DC | PRN

## 2015-02-24 MED ORDER — MIDAZOLAM HCL 5 MG/5ML IJ SOLN
INTRAMUSCULAR | Status: DC | PRN
  Administered 2015-02-24: 2 mg via INTRAVENOUS

## 2015-02-24 MED ORDER — DIAZEPAM 5 MG PO TABS
5.0000 mg | ORAL_TABLET | Freq: Four times a day (QID) | ORAL | Status: DC | PRN
  Administered 2015-02-24 – 2015-02-25 (×2): 5 mg via ORAL
  Filled 2015-02-24 (×2): qty 1

## 2015-02-24 MED ORDER — OXYCODONE HCL 5 MG/5ML PO SOLN: 5.0000 mg | Freq: Once | ORAL | Status: DC | PRN

## 2015-02-24 SURGICAL SUPPLY — 62 items
BAG DECANTER FOR FLEXI CONT (MISCELLANEOUS) ×2 IMPLANT
BENZOIN TINCTURE PRP APPL 2/3 (GAUZE/BANDAGES/DRESSINGS) IMPLANT
BLADE CLIPPER SURG (BLADE) IMPLANT
BLADE SURG 11 STRL SS (BLADE) ×2 IMPLANT
BUR MATCHSTICK NEURO 3.0 LAGG (BURR) ×2 IMPLANT
CAGE EXPANSE 8X6X6 (Cage) ×6 IMPLANT
CAGE PEEK 5X14X11 (Cage) ×1 IMPLANT
CAGE PEEK 6X14X11 (Cage) ×4 IMPLANT
CAGE SPNL 11X14X5XRADOPQ (Cage) ×1 IMPLANT
CANISTER SUCT 3000ML PPV (MISCELLANEOUS) ×2 IMPLANT
DECANTER SPIKE VIAL GLASS SM (MISCELLANEOUS) ×2 IMPLANT
DERMABOND ADVANCED (GAUZE/BANDAGES/DRESSINGS) ×1
DERMABOND ADVANCED .7 DNX12 (GAUZE/BANDAGES/DRESSINGS) ×1 IMPLANT
DRAIN CHANNEL 10M FLAT 3/4 FLT (DRAIN) IMPLANT
DRAPE C-ARM 42X72 X-RAY (DRAPES) ×4 IMPLANT
DRAPE LAPAROTOMY 100X72 PEDS (DRAPES) ×2 IMPLANT
DRAPE MICROSCOPE LEICA (MISCELLANEOUS) ×2 IMPLANT
DRAPE POUCH INSTRU U-SHP 10X18 (DRAPES) ×2 IMPLANT
DRAPE PROXIMA HALF (DRAPES) IMPLANT
DRSG OPSITE POSTOP 3X4 (GAUZE/BANDAGES/DRESSINGS) ×2 IMPLANT
DRSG TEGADERM 4X4.75 (GAUZE/BANDAGES/DRESSINGS) ×8 IMPLANT
DURAPREP 6ML APPLICATOR 50/CS (WOUND CARE) ×2 IMPLANT
ELECT COATED BLADE 2.86 ST (ELECTRODE) ×2 IMPLANT
ELECT REM PT RETURN 9FT ADLT (ELECTROSURGICAL) ×2
ELECTRODE REM PT RTRN 9FT ADLT (ELECTROSURGICAL) ×1 IMPLANT
EVACUATOR SILICONE 100CC (DRAIN) IMPLANT
GAUZE SPONGE 4X4 16PLY XRAY LF (GAUZE/BANDAGES/DRESSINGS) IMPLANT
GLOVE BIOGEL PI IND STRL 7.5 (GLOVE) ×1 IMPLANT
GLOVE BIOGEL PI INDICATOR 7.5 (GLOVE) ×1
GLOVE ECLIPSE 7.0 STRL STRAW (GLOVE) ×2 IMPLANT
GLOVE EXAM NITRILE LRG STRL (GLOVE) IMPLANT
GLOVE EXAM NITRILE MD LF STRL (GLOVE) IMPLANT
GLOVE EXAM NITRILE XL STR (GLOVE) IMPLANT
GLOVE EXAM NITRILE XS STR PU (GLOVE) IMPLANT
GOWN STRL REUS W/ TWL LRG LVL3 (GOWN DISPOSABLE) ×2 IMPLANT
GOWN STRL REUS W/ TWL XL LVL3 (GOWN DISPOSABLE) IMPLANT
GOWN STRL REUS W/TWL 2XL LVL3 (GOWN DISPOSABLE) IMPLANT
GOWN STRL REUS W/TWL LRG LVL3 (GOWN DISPOSABLE) ×2
GOWN STRL REUS W/TWL XL LVL3 (GOWN DISPOSABLE)
HEMOSTAT POWDER KIT SURGIFOAM (HEMOSTASIS) ×2 IMPLANT
KIT BASIN OR (CUSTOM PROCEDURE TRAY) ×2 IMPLANT
KIT ROOM TURNOVER OR (KITS) ×2 IMPLANT
LIQUID BAND (GAUZE/BANDAGES/DRESSINGS) ×2 IMPLANT
NEEDLE HYPO 25X1 1.5 SAFETY (NEEDLE) ×2 IMPLANT
NEEDLE SPNL 22GX3.5 QUINCKE BK (NEEDLE) ×2 IMPLANT
NS IRRIG 1000ML POUR BTL (IV SOLUTION) ×2 IMPLANT
PACK LAMINECTOMY NEURO (CUSTOM PROCEDURE TRAY) ×2 IMPLANT
PAD ARMBOARD 7.5X6 YLW CONV (MISCELLANEOUS) ×6 IMPLANT
PLATE 3 60XNS SPNE CVD ANT T (Plate) ×1 IMPLANT
PLATE 3 ATLANTIS TRANS (Plate) ×1 IMPLANT
RUBBERBAND STERILE (MISCELLANEOUS) ×4 IMPLANT
SCREW SELF TAP VAR 4.0X13 (Screw) ×16 IMPLANT
SPONGE INTESTINAL PEANUT (DISPOSABLE) ×2 IMPLANT
SPONGE SURGIFOAM ABS GEL 100 (HEMOSTASIS) ×2 IMPLANT
STRIP CLOSURE SKIN 1/2X4 (GAUZE/BANDAGES/DRESSINGS) IMPLANT
SUT ETHILON 3 0 FSL (SUTURE) IMPLANT
SUT VIC AB 3-0 SH 8-18 (SUTURE) ×2 IMPLANT
SUT VICRYL 3-0 RB1 18 ABS (SUTURE) ×2 IMPLANT
TAPE CLOTH 2X10 TAN LF (GAUZE/BANDAGES/DRESSINGS) ×2 IMPLANT
TOWEL OR 17X24 6PK STRL BLUE (TOWEL DISPOSABLE) ×2 IMPLANT
TOWEL OR 17X26 10 PK STRL BLUE (TOWEL DISPOSABLE) ×2 IMPLANT
WATER STERILE IRR 1000ML POUR (IV SOLUTION) ×2 IMPLANT

## 2015-02-24 NOTE — Progress Notes (Signed)
Utilization review completed.  

## 2015-02-24 NOTE — Anesthesia Procedure Notes (Signed)
Procedure Name: Intubation Date/Time: 02/24/2015 11:07 AM Performed by: Melina Copa, Deepika Decatur R Pre-anesthesia Checklist: Patient identified, Emergency Drugs available, Suction available, Patient being monitored and Timeout performed Patient Re-evaluated:Patient Re-evaluated prior to inductionOxygen Delivery Method: Circle system utilized Preoxygenation: Pre-oxygenation with 100% oxygen Intubation Type: IV induction Ventilation: Mask ventilation without difficulty Laryngoscope Size: Mac and 3 Grade View: Grade II Tube type: Oral Tube size: 7.5 mm Number of attempts: 1 Airway Equipment and Method: Stylet Placement Confirmation: ETT inserted through vocal cords under direct vision and positive ETCO2 Secured at: 21 cm Tube secured with: Tape Dental Injury: Teeth and Oropharynx as per pre-operative assessment

## 2015-02-24 NOTE — Telephone Encounter (Signed)
Thank you.  Keep [place] posted.

## 2015-02-24 NOTE — Telephone Encounter (Signed)
Patient's husband wanted to [facility] Dr.Scott that his wife's neck surgery went well today, she's currently in recovery doing well.

## 2015-02-24 NOTE — Progress Notes (Signed)
RN offered pt to get up and walk, but pt refused stating "I hurt too much". Will continue to monitor.  Fredrich Romans, RN

## 2015-02-24 NOTE — Op Note (Signed)
PREOP DIAGNOSIS: Cervical Spondylosis with radiculopathy, C4-5, C5-6, C6-7  POSTOP DIAGNOSIS: Same  PROCEDURE: 1. Discectomy at C4-5, C5-6, C6-7 for decompression of spinal cord and exiting nerve roots  2. Placement of intervertebral biomechanical device, Medtronic PTC graft, 29m @ C4-5, 561m@ C5-6, 30m71m C6-7 3. Placement of anterior instrumentation consisting of interbody plate and screws spanning C4-C7 with Atlantis translational plate, 33m64m Use of non-structural bone allograft - Medtronic Expanse 5. Arthrodesis C4-5, C5-6, C6-7, anterior interbody technique  6. Use of intraoperative microscope  SURGEON: Dr. NeelConsuella Lose  ASSISTANT: Dr. KyleAshok Pall  ANESTHESIA: General Endotracheal  EBL: 100cc  SPECIMENS: None  DRAINS: None  COMPLICATIONS: None immediate  CONDITION: Hemodynamically stable to PACU  HISTORY: Jennifer Hodges 69 y57. woman initially seen in the outpatient neurosurgery clinic complaining of neck and right greater than left arm pain and weakness. MRI was done which demonstrated multilevel cervical spondylosis including foraminal and mild central stenosis at C4-5, C5-6, and C6-7. She attempted a reasonable course of conservative treatments which was unsuccessful. She therefore elected to proceed with surgical decompression and fusion at the affected levels. The risks and benefits of the surgery were explained in detail the patient and her family. After all questions were answered informed consent was obtained.  PROCEDURE IN DETAIL: The patient was brought to the operating room and transferred to the operative table. After induction of general anesthesia, the patient was positioned on the operative table in the supine position with all pressure points meticulously padded. The skin of the neck was then prepped and draped in the usual sterile fashion.  After timeout was conducted, the skin was infiltrated with local anesthetic. Skin incision was then  made sharply and Bovie electrocautery was used to dissect the subcutaneous tissue until the platysma was identified. The platysma was then divided and undermined. The sternocleidomastoid muscle was then identified and, utilizing natural fascial planes in the neck, the prevertebral fascia was identified and the carotid sheath was retracted laterally and the trachea and esophagus retracted medially. Again using Xray, the correct disc spaces were identified. Bovie electrocautery was used to dissect in the subperiosteal plane and elevate the bilateral longus coli muscles. Self-retaining retractors were then placed. At this point, the microscope was draped and brought into the field, and the remainder of the case was done under the microscope using microdissecting technique.  The C6-7 disc space was incised sharply and rongeurs were use to initially complete a discectomy. The disc space was noted to be significantly collapsed. The high-speed drill was then used to complete discectomy until the posterior annulus was identified and removed and the posterior longitudinal ligament was identified. Using a nerve hook, the PLL was elevated, and Kerrison rongeurs were used to remove the posterior longitudinal ligament and the ventral thecal sac was identified. Using a combination of curettes and rongeurs, complete decompression of the thecal sac and exiting nerve roots at this level was completed, and verified using micro-nerve hook.  At this point, a 30mm 75merbody cage was sized and packed with morcellized bone allograft. This was then inserted and tapped into place.  Attention was then turned to the C5-6 level. In a similar fashion, discectomy was completed initially with curettes and rongeurs, and completed with the drill. This space was also significantly collapsed. The PLL was again identified, elevated and incised. Using Kerrison rongeurs, decompression of the spinal cord and exiting roots at the C5-6 level was  completed and confirmed with a dissector.  A 35m interbody cage was then sized and filled with bone allograft, and tapped into place. Position of the interbody devices was then confirmed with fluoroscopy.  Attention was then turned to the C4-5 level. In a similar fashion, discectomy was completed initially with curettes and rongeurs, and completed with the drill. The PLL was again identified, elevated and incised. Using Kerrison rongeurs, decompression of the spinal cord and exiting roots at the C4-5 level was completed and confirmed with a dissector. A 652minterbody cage was then sized and filled with bone allograft, and tapped into place.  After placement of the intervertebral devices, the 6041mnterior cervical plate was selected, and placed across the interspaces. Using a high-speed drill, the cortex of the cervical vertebral bodies was punctured, and screws inserted in the C4, C5, C6, C7 levels. Final fluoroscopic images in AP and lateral projections were taken to confirm good hardware placement.  At this point, after all counts were verified to be correct, meticulous hemostasis was secured using a combination of bipolar electrocautery and passive hemostatics. The platysma muscle was then closed using interrupted 3-0 Vicryl sutures, and the skin was closed with a interrupted subcuticular stitch and dermabond. Sterile dressings were then applied and the drapes removed.  The patient tolerated the procedure well and was extubated in the room and taken to the postanesthesia care unit in stable condition. At the end of the case all sponge, needle, cottonoid, and instrument counts were correct.

## 2015-02-24 NOTE — Telephone Encounter (Signed)
FYI

## 2015-02-24 NOTE — Transfer of Care (Signed)
Immediate Anesthesia Transfer of Care Note  Patient: Jennifer Hodges  Procedure(s) Performed: Procedure(s) with comments: CERVICAL FOUR-FIVE, CERVICAL FIVE-SIX, CERVICAL SIX-SEVEN ANTERIOR CERVICAL DECOMPRESSION/DISCECTOMY FUSION  (N/A) - C45 C56 C67 anterior cervical decompression with fusion interbody prosthesis plating and bonegraft  Patient Location: PACU  Anesthesia Type:General  Level of Consciousness: awake and patient cooperative  Airway & Oxygen Therapy: Patient Spontanous Breathing and Patient connected to nasal cannula oxygen  Post-op Assessment: Report given to RN, Post -op Vital signs reviewed and stable and Patient moving all extremities  Post vital signs: Reviewed and stable  Last Vitals:  Filed Vitals:   02/24/15 0841  BP: 127/66  Pulse: 69  Temp: 36.3 C  Resp: 16    Complications: No apparent anesthesia complications

## 2015-02-24 NOTE — Anesthesia Preprocedure Evaluation (Signed)
Anesthesia Evaluation  Patient identified by MRN, date of birth, ID band Patient awake    Reviewed: Allergy & Precautions, NPO status , Patient's Chart, lab work & pertinent test results  History of Anesthesia Complications (+) PONV and history of anesthetic complications  Airway Mallampati: II  TM Distance: >3 FB Neck ROM: Full    Dental  (+) Teeth Intact   Pulmonary neg shortness of breath, asthma , sleep apnea and Continuous Positive Airway Pressure Ventilation , COPD,  COPD inhaler, Recent URI , Resolved, former smoker,    breath sounds clear to auscultation       Cardiovascular negative cardio ROS   Rhythm:Regular     Neuro/Psych  Headaches,  Neuromuscular disease negative psych ROS   GI/Hepatic Neg liver ROS, hiatal hernia, GERD  Medicated and Controlled,  Endo/Other  diabetes  Renal/GU negative Renal ROS     Musculoskeletal  (+) Arthritis ,   Abdominal   Peds  Hematology negative hematology ROS (+)   Anesthesia Other Findings   Reproductive/Obstetrics                             Anesthesia Physical Anesthesia Plan  ASA: III  Anesthesia Plan: General   Post-op Pain Management:    Induction: Intravenous  Airway Management Planned: Oral ETT  Additional Equipment: None  Intra-op Plan:   Post-operative Plan: Extubation in OR  Informed Consent: I have reviewed the patients History and Physical, chart, labs and discussed the procedure including the risks, benefits and alternatives for the proposed anesthesia with the patient or authorized representative who has indicated his/her understanding and acceptance.   Dental advisory given  Plan Discussed with: CRNA and Surgeon  Anesthesia Plan Comments:         Anesthesia Quick Evaluation

## 2015-02-24 NOTE — H&P (Signed)
CC:  Neck and arm pain and weakness  HPI: Jennifer Hodges is a 69 year old woman I'm seeing for neck and arm pain. She has seen Dr. Luiz Ochoa about 2 years ago. At that time she was seen for neck pain with an MRI demonstrating multilevel cervical spondylosis and stenosis which was managed conservatively. She says that over the last 2 years, her symptoms have been progressively worsening in terms of pain in the neck. She does have some radiation of the pain up into the back of her head, as well as down into her lower back. She also describes pain which runs down into her arms. She describes some weakness on the right more than the left, including grip strength weakness, and dropping things that she is holding. She does also complain of imbalance although she has not fallen. She is unsure whether the imbalance is related to her hip osteoarthritis (status post bilateral hip replacements). She does also have a history of bladder symptoms, and has apparently seen urology. These symptoms have not changed recently.   Of note, the patient has had a course of physical therapy for 12 weeks, including compliance with a developed home exercise program. She says unfortunately this has not really helped with her pain, and it has continued to progress. She has also undergone cervical epidural steroid injections with Dr. Sharlet Salina, but says that the pain in her neck was only relieved for about a month or so before it returned.   PMH: Past Medical History  Diagnosis Date  . Hypercholesterolemia   . Bronchiectasis (H[facility])     mild  . Asthma   . GERD (gastroesophageal reflux disease)     EGD 8/09- non bleeding erosive gastritis, documentd esophageal ulcerations.   . Diverticulosis   . Hiatal hernia   . Osteoarthritis     lumbar disc disease, left hip  . Chronic headaches      followed by [facility] migraines  . PONV (postoperative nausea and vomiting)     "Only with last hip and I believe it was due to the morphine"    . Family history of adverse reaction to anesthesia     PONV  . COPD (chronic obstructive pulmonary disease) ([place])   . History of pneumonia   . Diabetes mellitus without complication [facility])     "doctor says I no longer have diabetes"    . Sleep apnea     cpap asked to bring mask and tubing  . Weakness of right side of body   . Gall stones     history of    PSH: Past Surgical History  Procedure Laterality Date  . Appendectomy    . Cholecystectomy    . Abdominal hysterectomy  age 37  . Right oophorectomy    . Excisional hemorrhoidectomy    . Lumbar laminectomy    . Eus N/A 05/31/2012    Procedure: UPPER ENDOSCOPIC ULTRASOUND (EUS) LINEAR;  Surgeon: [name], MD;  Location: [facility] END[facility];  Service: Endoscopy;  Laterality: N/A;  . Total hip arthroplasty Left 05/01/2014    Dr. [name]  . Breast surgery Bilateral     cyst removed and reduction  . Back surgery      4th lumbar fusion  . Bladder surgery N/A     with vaginal wall repair  . Cataract extraction w/ intraocular lens implant Bilateral 2015  . Total hip arthroplasty Right 08/05/2014    Procedure: TOTAL HIP ARTHROPLASTY ANTERIOR APPROACH;  Surgeon: [name], MD;  Location:  [facility] ORS;  Service: Orthopedics;  Laterality: Right;  . Reduction mammaplasty  1990  . Cardiac catheterization  2014    SH: Social History  Substance Use Topics  . Smoking status: Former Smoker    Quit date: 06/13/2007  . Smokeless tobacco: Never Used  . Alcohol Use: No    MEDS: Prior to Admission medications   Medication Sig Start Date End Date Taking? Authorizing Provider  acetaminophen (TYLENOL) 500 MG tablet Take 500 mg by mouth every 8 (eight) hours as needed for mild pain or moderate pain.   Yes Historical Provider, MD  amoxicillin (AMOXIL) 875 MG tablet Take 1 tablet (875 mg total) by mouth 2 (two) times daily. 02/12/15  Yes Einar Pheasant, MD  Cholecalciferol (VITAMIN D PO) Take 800 Units by mouth.   Yes Historical Provider,  MD  Chromium Picolinate (CHROMIUM PICOLATE PO) Take 1 tablet by mouth every morning.   Yes Historical Provider, MD  Coenzyme Q10 (CO Q 10) 100 MG CAPS Take 100 mg by mouth every morning.    Yes Historical Provider, MD  Garlic 3976 MG CAPS Take 2 capsules by mouth every morning.    Yes Historical Provider, MD  guaiFENesin (MUCINEX) 600 MG 12 hr tablet Take 600 mg by mouth daily.   Yes Historical Provider, MD  hyoscyamine (LEVSIN, ANASPAZ) 0.125 MG tablet Take 0.125 mg by mouth every 4 (four) hours as needed for cramping.   Yes Historical Provider, MD  magnesium gluconate (MAGONATE) 500 MG tablet Take 500 mg by mouth 2 (two) times daily.   Yes Historical Provider, MD  pantoprazole (PROTONIX) 40 MG tablet TAKE 1 TABLET DAILY 02/16/15  Yes Einar Pheasant, MD  topiramate (TOPAMAX) 25 MG tablet Take 25 mg by mouth at bedtime.    Yes Historical Provider, MD  traMADol (ULTRAM) 50 MG tablet Take 1 tablet (50 mg total) by mouth every 6 (six) hours as needed. 03/20/13  Yes Einar Pheasant, MD    ALLERGY: Allergies  Allergen Reactions  . Aspirin Hives and Other (See Comments)    Difficulty breathing  . Morphine And Related Nausea And Vomiting and Swelling  . Adhesive [Tape] Other (See Comments)    Took top layer of skin off when removed.  . Clarithromycin Nausea And Vomiting  . Codeine Nausea And Vomiting  . Darvon [Propoxyphene Hcl] Nausea And Vomiting  . Demerol [Meperidine] Nausea And Vomiting  . Talwin [Pentazocine] Nausea And Vomiting    ROS: ROS  NEUROLOGIC EXAM: Awake, alert, oriented Memory and concentration grossly intact Speech fluent, appropriate CN grossly intact Motor exam: Upper Extremities Deltoid Bicep Tricep Grip  Right 5/5 5/5 4+/5 4+/5  Left 5/5 5/5 5/5 4+/5    4+/5 bilateral finger abduction  Lower Extremity IP Quad PF DF EHL  Right 5/5 5/5 5/5 5/5 5/5  Left 5/5 5/5 5/5 5/5 5/5   Sensation grossly intact to LT  IMGAING: MRI of the cervical spine was reviewed. This  demonstrates straightening of the normal cervical lordosis. Primary findings are seen at C4 C5 where there is mild central stenosis, with moderate right and mild left foraminal stenosis. At C5 C6 there is also seen mild central stenosis, and severe left-sided foraminal stenosis. At C6 C7 there is moderate to severe left-sided foraminal stenosis with minimal central stenosis.  IMPRESSION: 69 year old woman with continued neck and bilateral arm pain despite an attempt at conservative treatments. She has stenosis from C4 C5 down to C6 C7.  PLAN: We will plan on proceeding with ACDF at  C4 C5, C5 C6, C6 C7   The risks of surgery were discussed in detail with the patient which include but are not limited to spinal cord injury which may result in hand, leg, and bowel dysfunction, postoperative dysphagia, dysphonia, neck hematoma, or subsequent surgery for epidural hematoma. The risk of CSF leak was also discussed. In addition, I explained to him that after spinal fusion surgery, there is a risk of adjacent level disease requiring future surgical intervention. The patient understood our discussion as well as the risks of the surgery and is willing to proceed. All questions were answered.

## 2015-02-24 NOTE — Progress Notes (Signed)
Pt arrived to room 5c09 from PACU.  Pt has nausea and vomiting, Zofran given.  Husband is at bedside.  Safety measures in place.  Will continue to monitor.   Fredrich Romans, RN

## 2015-02-24 NOTE — Progress Notes (Signed)
Pt had a syncope episode this past week where she almost blacked out.  Pt saw Dr Nicki Reaper [facility]) after this and said it had to do with her looking up and reaching up.  None since.

## 2015-02-25 LAB — GLUCOSE, CAPILLARY: Glucose-Capillary: 138 mg/dL — ABNORMAL HIGH (ref 65–99)

## 2015-02-25 MED ORDER — ONDANSETRON HCL 4 MG PO TABS: 4.0000 mg | ORAL_TABLET | Freq: Three times a day (TID) | ORAL | Status: DC | PRN

## 2015-02-25 MED ORDER — OXYCODONE-ACETAMINOPHEN 5-325 MG PO TABS: 1.0000 | ORAL_TABLET | ORAL | Status: DC | PRN

## 2015-02-25 MED ORDER — DIAZEPAM 5 MG PO TABS: 5.0000 mg | ORAL_TABLET | Freq: Four times a day (QID) | ORAL | Status: DC | PRN

## 2015-02-25 NOTE — Progress Notes (Signed)
Pt ambulated to Bedside commode, tolerated well. Will continue to monitor.   Fredrich Romans, RN

## 2015-02-25 NOTE — Progress Notes (Signed)
Patient requested percocet prior to discharge, RN took percocet out of packaging in room, patient then stated she wanted tylenol instead. RN wasted 2 percocet with Vilinda Blanks, RN.

## 2015-02-25 NOTE — Progress Notes (Signed)
RN discussed discharge instructions with patient. RN discussed hydration r/t clearing throat of phelgm, new medications including percocet, valium, zofran, RN also discussed advancing diet, s/sx of infection, symptoms to report to MD, constipation prevention. MD aware of patient's clear liquid diet d/t sore throat and nausea.  Patient and husband vocalized understanding of all instructions. IV removed, patient is now getting dressed with nurse tech [place] in room. Patient states all narcotics cause her stomach to be uncomfortable, patient educated on protonix (patient states she takes this medication at home), taking medication with full meal. Patient states she would like to use percocet medication at home. Patient to be escorted by nurse tech to husband's car. Will continue to monitor until discharge.

## 2015-02-25 NOTE — Progress Notes (Signed)
Patient stated to RN that she did not feel comfortable going home. RN discussed discharge with patient, offered to contact MD to cancel discharge until tomorrow. Patient stated she did not want MD contacted, stated she wants to go home to rest in her own bed, then she will be fine. RN notified charge Therapist, sports. Patient states she wants to be discharged. Patient discharged home with husband. Patient got in car with minimal assistance from nurse tech per nurse tech.

## 2015-02-25 NOTE — Discharge Summary (Addendum)
  Physician Discharge Summary  Patient ID: Jennifer Hodges MRN: [id number] DOB/AGE: [birth year] 69 y.o.  Admit date: 02/24/2015 Discharge date: 02/25/2015  Admission Diagnoses: Cervical spondylosis with radiculopathy, C4-5, C5-6, C6-7  Discharge Diagnoses: Same Active Problems:   Cervical spondylosis with radiculopathy   Discharged Condition: Stable  Hospital Course:  Mrs. Jennifer Hodges is a 69 y.o. female electively admitted after ACDF. Postoperatively, the patient was at neurologic baseline, reporting relief of bilateral arm pain and improvement in posterior neck pain. She was tolerating diet, ambulating independently, voiding normally, with pain controlled with oral medication.  Treatments: Surgery - ACDF C4-5, C5-6, C6-7  Discharge Exam: Blood pressure 118/50, pulse 84, temperature 98.3 F (36.8 C), temperature source Oral, resp. rate 20, height '5\' 2"'$  (1.575 m), weight 84.732 kg (186 lb 12.8 oz), SpO2 91 %. Awake, alert, oriented Speech fluent, appropriate CN grossly intact Good strength, minimal bilateral tricep/right grip weakness. Stable from preop Wound c/d/i  Disposition: 01-Home or Self Care     Medication List    STOP taking these medications        traMADol 50 MG tablet  Commonly known as:  ULTRAM      TAKE these medications        acetaminophen 500 MG tablet  Commonly known as:  TYLENOL  Take 500 mg by mouth every 8 (eight) hours as needed for mild pain or moderate pain.     amoxicillin 875 MG tablet  Commonly known as:  AMOXIL  Take 1 tablet (875 mg total) by mouth 2 (two) times daily.     CHROMIUM PICOLATE PO  Take 1 tablet by mouth every morning.     Co Q 10 100 MG Caps  Take 100 mg by mouth every morning.     diazepam 5 MG tablet  Commonly known as:  VALIUM  Take 1 tablet (5 mg total) by mouth every 6 (six) hours as needed for muscle spasms.     Garlic 7829 MG Caps  Take 2 capsules by mouth every morning.     guaiFENesin 600 MG 12 hr  tablet  Commonly known as:  MUCINEX  Take 600 mg by mouth daily.     hyoscyamine 0.125 MG tablet  Commonly known as:  LEVSIN, ANASPAZ  Take 0.125 mg by mouth every 4 (four) hours as needed for cramping.     magnesium gluconate 500 MG tablet  Commonly known as:  MAGONATE  Take 500 mg by mouth 2 (two) times daily.     ondansetron 4 MG tablet  Commonly known as:  ZOFRAN  Take 1 tablet (4 mg total) by mouth every 8 (eight) hours as needed for nausea or vomiting.     oxyCODONE-acetaminophen 5-325 MG tablet  Commonly known as:  PERCOCET/ROXICET  Take 1 tablet by mouth every 4 (four) hours as needed for moderate pain.     pantoprazole 40 MG tablet  Commonly known as:  PROTONIX  TAKE 1 TABLET DAILY     topiramate 25 MG tablet  Commonly known as:  TOPAMAX  Take 25 mg by mouth at bedtime.     VITAMIN D PO  Take 800 Units by mouth.       Follow-up Information    Follow up with [facility], Isaul Landi, C, MD In 2 weeks.   Specialty:  Neurosurgery   Contact information:   [address] [place] [address] [phone]       Signed: [name], Loletha Grayer 02/25/2015, 11:26 AM

## 2015-02-25 NOTE — Progress Notes (Signed)
Pt ambulated 30 feet from bed to hallway and in the room, pt tolerated well.  Will continue to monitor.   Fredrich Romans, RN

## 2015-02-25 NOTE — Care Management Note (Signed)
Case Management Note  Patient Details  Name: Jennifer Hodges MRN: [id number] Date of Birth: 08-06-1945  Subjective/Objective:  Patient admitted for a C4-7 ACDF. Patient is from home with her spouse.                  Action/Plan: Plan is to discharge home today with self care. No further needs per CM.  Expected Discharge Date:                  Expected Discharge Plan:  Home/Self Care  In-House Referral:     Discharge planning Services     Post Acute Care Choice:    Choice offered to:     DME Arranged:    DME Agency:     HH Arranged:    [place] Agency:     Status of Service:  Completed, signed off  Medicare Important Message Given:    Date Medicare IM Given:    Medicare IM give by:    Date Additional Medicare IM Given:    Additional Medicare Important Message give by:     If discussed at [place] of Stay Meetings, dates discussed:    Additional Comments:  Pollie Friar, RN 02/25/2015, 11:52 AM

## 2015-02-25 NOTE — Discharge Instructions (Signed)
Can shower starting tomorrow. Remove the transparent dressing tomorrow. Walk as much as tolerated at home. No lifting > 10lbs, no lifting over the shoulders.  Anterior Cervical Diskectomy and Fusion Anterior cervical diskectomy and fusion is a surgery that is done on the neck (cervical spine) to take pressure off of the nerves or the spinal cord. It is performed through the front (anterior) part of the neck. During this surgery, the damaged disk that is causing pain, numbness, or weakness is removed. The area where the disk was removed is filled with a plastic spacer implant, a bone graft, or both. These implants and bone grafts take pressure off of the nerves and spinal cord by making more room for the nerves to leave the spine. LET [facility] CARE PROVIDER KNOW ABOUT:  Any allergies you have.  All medicines you are taking, including vitamins, herbs, eye drops, creams, and over-the-counter medicines.  Previous problems you or members of your family have had with the use of anesthetics.  Any blood disorders you have.  Previous surgeries you have had.  Any medical conditions you may have. RISKS AND COMPLICATIONS Generally, this is a safe procedure. However, problems may occur, including:  Infection.  Bleeding with the possible need for blood transfusion.  Injury to surrounding structures, including nerves.  Leakage of fluid from the brain or spinal cord (cerebrospinal fluid).  Blood clots.  Temporary breathing difficulties after surgery. BEFORE THE PROCEDURE  Follow your health care provider's instructions about eating or drinking restrictions.  Ask your health care provider about:  Changing or stopping your regular medicines. This is especially important if you are taking diabetes medicines or blood thinners.  Taking medicines such as aspirin and ibuprofen. These medicines can thin your blood. Do not take these medicines before your procedure if your health care provider  instructs you not to.  You may be given antibiotic medicines to help prevent infection.  Your incision site may be marked on your neck. PROCEDURE  An IV tube will be inserted into one of your veins.  You will be given one or more of the following:  A medicine that helps you relax (sedative).  A medicine that makes you fall asleep (general anesthetic).  A breathing tube will be placed.  Your neck will be cleaned with a germ-killing solution (antiseptic).  Your surgeon will make an incision on the front of your neck, usually within a skinfold line.  Your neck muscles will be spread apart, and the damaged disk and bone spurs will be removed.  The area where the disk was removed will be filled with a small plastic spacer implant, a bone graft, or both.  Your surgeon may put metal plates and screws (hardware) in your neck. This helps to stabilize the surgical site and keep implants and bone grafts in place. The hardware reduces motion at the surgical site so your bones can grow together (fuse). This provides extra support to your neck.  The incision will be closed with stitches (sutures).  A bandage (dressing) will be applied to cover the incision. The procedure may vary among health care providers and hospitals. AFTER THE PROCEDURE  Your blood pressure, heart rate, breathing rate, and blood oxygen level will be monitored often until the medicines you were given have worn off.  You will be monitored for any signs of complications from the procedure, such as:  Too much bleeding from the incision site.  A buildup of blood under your skin at the surgical site.  Difficulty breathing.  You may continue to receive antibiotics.  You can start to eat as soon as you feel comfortable.  You may be given a neck brace to wear after surgery. This brace limits your neck movement while your bones are fusing together. Follow your health care provider's instructions about how often and how  long you need to wear this.   This information is not intended to replace advice given to you by your health care provider. Make sure you discuss any questions you have with your health care provider.   Document Released: 02/16/2009 Document Revised: 03/21/2014 Document Reviewed: 02/16/2009 Elsevier Interactive Patient Education [employer].

## 2015-02-26 ENCOUNTER — Encounter (HOSPITAL_COMMUNITY): Payer: Self-pay | Admitting: Neurosurgery

## 2015-02-26 NOTE — Anesthesia Postprocedure Evaluation (Signed)
Anesthesia Post Note  Patient: Jennifer Hodges  Procedure(s) Performed: Procedure(s) (LRB): CERVICAL FOUR-FIVE, CERVICAL FIVE-SIX, CERVICAL SIX-SEVEN ANTERIOR CERVICAL DECOMPRESSION/DISCECTOMY FUSION  (N/A)  Patient location during evaluation: PACU Anesthesia Type: General Level of consciousness: awake Pain management: pain level controlled Vital Signs Assessment: post-procedure vital signs reviewed and stable Respiratory status: spontaneous breathing Cardiovascular status: stable Postop Assessment: no signs of nausea or vomiting Anesthetic complications: no    Last Vitals:  Filed Vitals:   02/25/15 0502 02/25/15 1016  BP: 128/65 118/50  Pulse: 93 84  Temp: 36.8 C 36.8 C  Resp: 20 20    Last Pain:  Filed Vitals:   02/25/15 1143  PainSc: 7                  Lemont Sitzmann

## 2015-03-02 DIAGNOSIS — G4733 Obstructive sleep apnea (adult) (pediatric): Secondary | ICD-10-CM | POA: Diagnosis not present

## 2015-03-02 DIAGNOSIS — J479 Bronchiectasis, uncomplicated: Secondary | ICD-10-CM | POA: Diagnosis not present

## 2015-03-03 ENCOUNTER — Other Ambulatory Visit: Payer: Self-pay | Admitting: Internal Medicine

## 2015-03-04 ENCOUNTER — Encounter: Payer: Self-pay | Admitting: Family Medicine

## 2015-03-04 ENCOUNTER — Ambulatory Visit (INDEPENDENT_AMBULATORY_CARE_PROVIDER_SITE_OTHER): Payer: Medicare Other | Admitting: Family Medicine

## 2015-03-04 VITALS — BP 126/82 | HR 86 | Temp 98.5°F | Ht 62.0 in | Wt 170.6 lb

## 2015-03-04 DIAGNOSIS — J029 Acute pharyngitis, unspecified: Secondary | ICD-10-CM | POA: Diagnosis not present

## 2015-03-04 DIAGNOSIS — J479 Bronchiectasis, uncomplicated: Secondary | ICD-10-CM

## 2015-03-04 DIAGNOSIS — R350 Frequency of micturition: Secondary | ICD-10-CM

## 2015-03-04 DIAGNOSIS — R197 Diarrhea, unspecified: Secondary | ICD-10-CM | POA: Diagnosis not present

## 2015-03-04 DIAGNOSIS — G8929 Other chronic pain: Secondary | ICD-10-CM

## 2015-03-04 DIAGNOSIS — R059 Cough, unspecified: Secondary | ICD-10-CM

## 2015-03-04 DIAGNOSIS — R05 Cough: Secondary | ICD-10-CM | POA: Diagnosis not present

## 2015-03-04 DIAGNOSIS — R51 Headache: Secondary | ICD-10-CM

## 2015-03-04 DIAGNOSIS — R519 Headache, unspecified: Secondary | ICD-10-CM

## 2015-03-04 LAB — POCT URINALYSIS DIPSTICK
Bilirubin, UA: NEGATIVE
Glucose, UA: NEGATIVE
Ketones, UA: NEGATIVE
Nitrite, UA: NEGATIVE
Protein, UA: NEGATIVE
Spec Grav, UA: 1.01
Urobilinogen, UA: 1
pH, UA: 5.5

## 2015-03-04 LAB — URINALYSIS, MICROSCOPIC ONLY: RBC / HPF: NONE SEEN (ref 0–?)

## 2015-03-04 LAB — POCT RAPID STREP A (OFFICE): Rapid Strep A Screen: NEGATIVE

## 2015-03-04 MED ORDER — FLUTICASONE PROPIONATE 50 MCG/ACT NA SUSP: 2.0000 | Freq: Every day | NASAL | Status: DC

## 2015-03-04 NOTE — Assessment & Plan Note (Signed)
Patient with urinary frequency and urgency. UA does have findings consistent with UTI. Benign abdominal exam. Patient just started on amoxicillin. We'll send urine culture and micro-. She is given return precautions.

## 2015-03-04 NOTE — Assessment & Plan Note (Addendum)
Patient with chronic history of headaches. Current headache was gradual in onset and has no associated neurological symptoms. Suspect this is related to her chronic headaches. Could potentially also be related to her neck and the recent surgery that she had. She does not have any midline discomfort on exam. She has no fevers. She is neurologically intact. She appears to be well healing from her surgery. Discussed treatment of her headaches and neck pain with tramadol as she's been taking this since his surgery. Advised that if her neck pain got worse she should call the surgeon's office. Advised that if she developed any worsening headache, sudden onset headache, or neurological deficit she should be evaluated immediately. Given return precautions.

## 2015-03-04 NOTE — Assessment & Plan Note (Addendum)
Patient with persistent cough and postnasal drip. Suspect most of this is likely related to allergic symptoms, though with cough productive of sputum and mild shortness of breath at night while wearing CPAP machine due to postnasal drip and mucus accumulation the question is, is this related to her bronchiectasis. We will obtain a chest x-ray to evaluate. She has no chest pain or exertional symptoms. Vital signs are stable with normal oxygenation and heart rate making PE unlikely cause. Her lung sounds are clear. Oxygenation is normal. Her vital signs are stable. Doubt PNA or bronchitis as cause. She is breathing normally in the office. Had a long discussion about options for treatment of her postnasal drip. She's currently on amoxicillin. I advised Flonase, antihistamine, or Singulair and she declined all of these. She will continue nasal saline. She needs to eliminate the allergen in the home that is causing her symptoms.She is already on antibiotics for potential infection.  She's given return precautions.

## 2015-03-04 NOTE — Progress Notes (Signed)
Patient ID: DAMALI BROADFOOT, female   DOB: 08-05-1945, 69 y.o.   MRN: [id number]  Tommi Rumps, MD Phone: [phone]  AMICA HARRON is a 69 y.o. female who presents today for same-day visit.  Patient presents today with a variety of complaints. She notes she has had postnasal drainage since the beginning of this month. She notes some sinus pressure as well. And sore throat. She was started on amoxicillin at the beginning of the month for 10 days. She finished this just prior to her cervical spine surgery. Her symptoms have not improved. She was prescribed amoxicillin yesterday as well by her PCP to cover for possible infection. She notes she has had allergies and been on multiple nasal steroids, oral antihistamines, and Singulair. Nothing has worked. She additionally notes that she has so much drainage that she has been vomiting up a small amount of phlegm. She also notes she is coughing up green mucus and has to clear out her throat persistently. She notes that the phlegm collects in her CPAP machine and makes it hard to breathe at night. She notes she does have minimal shortness of breath with that. She is not short of breath with exertion. Denies orthopnea. She has no chest pain. She notes she only has trouble breathing when her CPAP gets clogged. She additionally notes posterior headaches. These are chronic in nature and have recurred in the last 3 days. They're gradual in onset. She has not had any numbness, weakness, or vision changes. She has not had any fevers. She notes her neck hurts when she tries to lay back and she has to support her head since the surgery, it does not bother her at other times. She also notes that she has had some urinary frequency and urgency. She denies dysuria. She has not had any vaginal discharge. She has also been having diarrhea since surgery. She notes she gets a cramping discomfort in her central abdomen with this that improves with a bowel movement. She has no  radiation of her abdominal cramping. She she's had a hysterectomy and an appendectomy and a cholecystectomy.  PMH: Former smoker.   ROS see history of present illness  Objective  Physical Exam Filed Vitals:   03/04/15 1305  BP: 126/82  Pulse: 86  Temp: 98.5 F (36.9 C)    Physical Exam  Constitutional: She is well-developed, well-nourished, and in no distress.  HENT:  Head: Normocephalic and atraumatic.  Right Ear: External ear normal.  Left Ear: External ear normal.  Normal TMs bilaterally, mild posterior oropharyngeal erythema, no exudates, no swelling of the throat  Eyes: Conjunctivae are normal. Pupils are equal, round, and reactive to light.  Neck: Neck supple.  No midline neck tenderness, there is muscular spasm and tenderness in her neck, there is no erythema, she has a well-healing anterior scar from recent surgery  Cardiovascular: Normal rate, regular rhythm and normal heart sounds.  Exam reveals no gallop and no friction rub.   No murmur heard. Pulmonary/Chest: Effort normal and breath sounds normal. No respiratory distress. She has no wheezes. She has no rales.  Abdominal: Soft. Bowel sounds are normal. She exhibits no distension. There is no tenderness. There is no rebound and no guarding.  Lymphadenopathy:    She has no cervical adenopathy.  Neurological: She is alert.  CN 2-12 intact, 5/5 strength in bilateral biceps, triceps, grip, quads, hamstrings, plantar and dorsiflexion, sensation to light touch intact in bilateral UE and LE, normal gait, 2+ patellar reflexes  Skin: Skin is warm and dry. She is not diaphoretic.     Assessment/Plan: Please see individual problem list.  Bronchiectasis Patient with persistent cough and postnasal drip. Suspect most of this is likely related to allergic symptoms, though with cough productive of sputum and mild shortness of breath at night while wearing CPAP machine due to postnasal drip and mucus accumulation the question is,  is this related to her bronchiectasis. We will obtain a chest x-ray to evaluate. She has no chest pain or exertional symptoms. Vital signs are stable with normal oxygenation and heart rate making PE unlikely cause. Her lung sounds are clear. Oxygenation is normal. Her vital signs are stable. Doubt PNA or bronchitis as cause. She is breathing normally in the office. Had a long discussion about options for treatment of her postnasal drip. She's currently on amoxicillin. I advised Flonase, antihistamine, or Singulair and she declined all of these. She will continue nasal saline. She needs to eliminate the allergen in the home that is causing her symptoms.She is already on antibiotics for potential infection.  She's given return precautions.  Diarrhea Patient has had persistent diarrhea and abdominal cramping since she was hospitalized for neck surgery. She has benign abdominal exam today. She is afebrile. Appears well hydrated.  She does not have an appendix, uterus, or gallbladder. Given that she was in the hospital we will obtain C. difficile stool studies to evaluate for this. Advised stay well hydrated. Will continue to monitor. Given return precautions.  Urinary frequency Patient with urinary frequency and urgency. UA does have findings consistent with UTI. Benign abdominal exam. Patient just started on amoxicillin. We'll send urine culture and micro-. She is given return precautions.  Chronic headaches Patient with chronic history of headaches. Current headache was gradual in onset and has no associated neurological symptoms. Suspect this is related to her chronic headaches. Could potentially also be related to her neck and the recent surgery that she had. She does not have any midline discomfort on exam. She has no fevers. She is neurologically intact. She appears to be well healing from her surgery. Discussed treatment of her headaches and neck pain with tramadol as she's been taking this since his  surgery. Advised that if her neck pain got worse she should call the surgeon's office. Advised that if she developed any worsening headache, sudden onset headache, or neurological deficit she should be evaluated immediately. Given return precautions.    Orders Placed This Encounter  Procedures  . Stool C-Diff Toxin Assay  . Urine culture  . DG Chest 2 View    Standing Status: Future     Number of Occurrences:      Standing Expiration Date: 05/04/2016    Order Specific Question:  Reason for Exam (SYMPTOM  OR DIAGNOSIS REQUIRED)    Answer:  cough productive of sputum, mild dyspnea    Order Specific Question:  Preferred imaging location?    Answer:  [facility]  . Urine Microscopic Only  . POCT Urinalysis Dipstick  . POCT rapid strep A    Meds ordered this encounter  Medications  . DISCONTD: fluticasone (FLONASE) 50 MCG/ACT nasal spray    Sig: Place 2 sprays into both nostrils daily.    Dispense:  16 g    Refill:  6    Dragon voice recognition software was used during the dictation process of this note. If any phrases or words seem inappropriate it is likely secondary to the translation process being inefficient.  Tommi Rumps

## 2015-03-04 NOTE — Patient Instructions (Signed)
Nice to meet you. Her drainage is likely related to an allergic component. You have declined treatment for this. He needed to eliminate the allergens from your living area. We'll obtain a chest x-ray to evaluate your cough. We will send her urine for culture. Please continue the amoxicillin. Please continue the tramadol for your headache. If your pain gets worse he'll let the surgeon know. If you develop fever, sudden onset headache, worsening headache, numbness, weakness, vision changes, abdominal pain, blood in her stool, nausea, vomiting, fever, or any new or changing symptoms please seek medical attention.

## 2015-03-04 NOTE — Progress Notes (Signed)
Pre visit review using our clinic review tool, if applicable. No additional management support is needed unless otherwise documented below in the visit note. 

## 2015-03-04 NOTE — Assessment & Plan Note (Addendum)
Patient has had persistent diarrhea and abdominal cramping since she was hospitalized for neck surgery. She has benign abdominal exam today. She is afebrile. Appears well hydrated.  She does not have an appendix, uterus, or gallbladder. Given that she was in the hospital we will obtain C. difficile stool studies to evaluate for this. Advised stay well hydrated. Will continue to monitor. Given return precautions.

## 2015-03-05 LAB — URINE CULTURE
Colony Count: NO GROWTH
Organism ID, Bacteria: NO GROWTH

## 2015-03-09 ENCOUNTER — Encounter: Payer: Self-pay | Admitting: Emergency Medicine

## 2015-03-09 ENCOUNTER — Emergency Department: Payer: Medicare Other

## 2015-03-09 ENCOUNTER — Emergency Department
Admission: EM | Admit: 2015-03-09 | Discharge: 2015-03-09 | Disposition: A | Discharge status: Home / Self Care | Payer: Medicare Other | Attending: Emergency Medicine | Admitting: Emergency Medicine

## 2015-03-09 DIAGNOSIS — Z792 Long term (current) use of antibiotics: Secondary | ICD-10-CM | POA: Diagnosis not present

## 2015-03-09 DIAGNOSIS — F419 Anxiety disorder, unspecified: Secondary | ICD-10-CM | POA: Diagnosis not present

## 2015-03-09 DIAGNOSIS — R51 Headache: Secondary | ICD-10-CM | POA: Insufficient documentation

## 2015-03-09 DIAGNOSIS — E119 Type 2 diabetes mellitus without complications: Secondary | ICD-10-CM | POA: Insufficient documentation

## 2015-03-09 DIAGNOSIS — R300 Dysuria: Secondary | ICD-10-CM | POA: Insufficient documentation

## 2015-03-09 DIAGNOSIS — Z87891 Personal history of nicotine dependence: Secondary | ICD-10-CM | POA: Diagnosis not present

## 2015-03-09 DIAGNOSIS — Z79899 Other long term (current) drug therapy: Secondary | ICD-10-CM | POA: Insufficient documentation

## 2015-03-09 DIAGNOSIS — R935 Abnormal findings on diagnostic imaging of other abdominal regions, including retroperitoneum: Secondary | ICD-10-CM | POA: Diagnosis not present

## 2015-03-09 LAB — BASIC METABOLIC PANEL
Anion gap: 7 (ref 5–15)
BUN: 8 mg/dL (ref 6–20)
CO2: 28 mmol/L (ref 22–32)
Calcium: 9.3 mg/dL (ref 8.9–10.3)
Chloride: 104 mmol/L (ref 101–111)
Creatinine, Ser: 0.44 mg/dL (ref 0.44–1.00)
GFR calc Af Amer: >60 mL/min (ref >60)
GFR calc non Af Amer: >60 mL/min (ref >60)
Glucose, Bld: 106 mg/dL — ABNORMAL HIGH (ref 65–99)
Potassium: 4 mmol/L (ref 3.5–5.1)
Sodium: 139 mmol/L (ref 135–145)

## 2015-03-09 LAB — CBC
HCT: 37.3 % (ref 35.0–47.0)
Hemoglobin: 12.4 g/dL (ref 12.0–16.0)
MCH: 30.2 pg (ref 26.0–34.0)
MCHC: 33.2 g/dL (ref 32.0–36.0)
MCV: 91 fL (ref 80.0–100.0)
Platelets: 397 10*3/uL (ref 150–440)
RBC: 4.09 MIL/uL (ref 3.80–5.20)
RDW: 13.7 % (ref 11.5–14.5)
WBC: 7.2 10*3/uL (ref 3.6–11.0)

## 2015-03-09 LAB — URINALYSIS COMPLETE WITH MICROSCOPIC (ARMC ONLY)
Bacteria, UA: NONE SEEN
Bilirubin Urine: NEGATIVE
Glucose, UA: NEGATIVE mg/dL
Hgb urine dipstick: NEGATIVE
Ketones, ur: NEGATIVE mg/dL
Leukocytes, UA: NEGATIVE
Nitrite: NEGATIVE
Protein, ur: NEGATIVE mg/dL
RBC / HPF: NONE SEEN RBC/hpf (ref 0–5)
Specific Gravity, Urine: 1.005 (ref 1.005–1.030)
pH: 6 (ref 5.0–8.0)

## 2015-03-09 MED ORDER — IOHEXOL 300 MG/ML  SOLN
100.0000 mL | Freq: Once | INTRAMUSCULAR | Status: AC | PRN
  Administered 2015-03-09: 100 mL via INTRAVENOUS

## 2015-03-09 MED ORDER — IOHEXOL 240 MG/ML SOLN
25.0000 mL | Freq: Once | INTRAMUSCULAR | Status: AC | PRN
  Administered 2015-03-09: 25 mL via ORAL

## 2015-03-09 MED ORDER — ACETAMINOPHEN 325 MG PO TABS
650.0000 mg | ORAL_TABLET | Freq: Once | ORAL | Status: AC
  Administered 2015-03-09: 650 mg via ORAL

## 2015-03-09 MED ORDER — ONDANSETRON HCL 4 MG/2ML IJ SOLN
4.0000 mg | Freq: Once | INTRAMUSCULAR | Status: AC
  Administered 2015-03-09: 4 mg via INTRAVENOUS

## 2015-03-09 NOTE — Discharge Instructions (Signed)

## 2015-03-09 NOTE — ED Notes (Signed)
Pt states she is having bowel movements from her urethra , had prolapse surgery 6 years ago. Burning and pain with urination, pt also c/o headache.

## 2015-03-09 NOTE — ED Provider Notes (Signed)
[facility] Emergency Department Provider Note  ____________________________________________  Time seen: On arrival  I have reviewed the triage vital signs and the nursing notes.   HISTORY  Chief Complaint Dysuria and Headache    HPI Jennifer Hodges is a 69 y.o. female who presents with concerns aboutpossibly having stool come out of her vagina. She reports that after she urinates she wipes and sometimes notes some stool. She has a history of bladder surgery which she is unable to provide significant details on. She denies fevers chills. She had a urinalysis by her PCP which was unremarkable and did not grow any bacteria. She feels well otherwise     Past Medical History  Diagnosis Date  . Hypercholesterolemia   . Bronchiectasis (HCC)     mild  . Asthma   . GERD (gastroesophageal reflux disease)     EGD 8/09- non bleeding erosive gastritis, documentd esophageal ulcerations.   . Diverticulosis   . Hiatal hernia   . Osteoarthritis     lumbar disc disease, left hip  . Chronic headaches      followed by [facility] migraines  . PONV (postoperative nausea and vomiting)     "Only with last hip and I believe it was due to the morphine"   . Family history of adverse reaction to anesthesia     PONV  . COPD (chronic obstructive pulmonary disease) ([place])   . History of pneumonia   . Diabetes mellitus without complication [facility])     "doctor says I no longer have diabetes"    . Sleep apnea     cpap asked to bring mask and tubing  . Weakness of right side of body   . Gall stones     history of    Patient Active Problem List   Diagnosis Date Noted  . Urinary frequency 03/04/2015  . Cervical spondylosis with radiculopathy 02/24/2015  . Pre-syncope 02/19/2015  . URI (upper respiratory infection) 02/15/2015  . Neck pain 12/25/2014  . Pelvic pain in female 11/16/2014  . Primary osteoarthritis of hip 08/05/2014  . Heel pain 06/01/2014  . Diarrhea  06/01/2014  . Health care maintenance 06/01/2014  . Right leg pain 03/30/2014  . Right arm pain 03/30/2014  . Sinusitis 03/30/2014  . Internal nasal lesion 11/12/2013  . Rib pain 08/04/2013  . Elevated AFP 04/28/2013  . Osteoporosis 11/12/2012  . Nonspecific (abnormal) findings on radiological and other examination of gastrointestinal tract 05/31/2012  . Bronchiectasis ([place]) 02/05/2012  . Sleep apnea 02/05/2012  . Degenerative disc disease, cervical 02/05/2012  . Hypercholesterolemia 02/05/2012  . GERD (gastroesophageal reflux disease) 02/05/2012  . Chronic headaches 02/05/2012    Past Surgical History  Procedure Laterality Date  . Appendectomy    . Cholecystectomy    . Abdominal hysterectomy  age 54  . Right oophorectomy    . Excisional hemorrhoidectomy    . Lumbar laminectomy    . Eus N/A 05/31/2012    Procedure: UPPER ENDOSCOPIC ULTRASOUND (EUS) LINEAR;  Surgeon: [name], MD;  Location: [facility];  Service: Endoscopy;  Laterality: N/A;  . Total hip arthroplasty Left 05/01/2014    Dr. [name]  . Breast surgery Bilateral     cyst removed and reduction  . Back surgery      4th lumbar fusion  . Bladder surgery N/A     with vaginal wall repair  . Cataract extraction w/ intraocular lens implant Bilateral 2015  . Total hip arthroplasty Right 08/05/2014  Procedure: TOTAL HIP ARTHROPLASTY ANTERIOR APPROACH;  Surgeon: [name], MD;  Location: [facility] ORS;  Service: Orthopedics;  Laterality: Right;  . Reduction mammaplasty  1990  . Cardiac catheterization  2014  . Anterior cervical decomp/discectomy fusion N/A 02/24/2015    Procedure: CERVICAL FOUR-FIVE, CERVICAL FIVE-SIX, CERVICAL SIX-SEVEN ANTERIOR CERVICAL DECOMPRESSION/DISCECTOMY FUSION ;  Surgeon: [name], MD;  Location: [facility];  Service: Neurosurgery;  Laterality: N/A;  C45 C56 C67 anterior cervical decompression with fusion interbody prosthesis plating and bonegraft    Current Outpatient  Rx  Name  Route  Sig  Dispense  Refill  . acetaminophen (TYLENOL) 500 MG tablet   Oral   Take 500 mg by mouth every 8 (eight) hours as needed for mild pain or moderate pain.         Marland Kitchen amoxicillin (AMOXIL) 875 MG tablet      TAKE 1 TABLET BY MOUTH TWICE DAILY   20 tablet   0   . Cholecalciferol (VITAMIN D PO)   Oral   Take 800 Units by mouth.         . Coenzyme Q10 (CO Q 10) 100 MG CAPS   Oral   Take 100 mg by mouth every morning.          . diazepam (VALIUM) 5 MG tablet   Oral   Take 1 tablet (5 mg total) by mouth every 6 (six) hours as needed for muscle spasms.   30 tablet   0   . Garlic 2563 MG CAPS   Oral   Take 2 capsules by mouth every morning.          Marland Kitchen guaiFENesin (MUCINEX) 600 MG 12 hr tablet   Oral   Take 600 mg by mouth daily.         . ondansetron (ZOFRAN) 4 MG tablet   Oral   Take 1 tablet (4 mg total) by mouth every 8 (eight) hours as needed for nausea or vomiting.   20 tablet   0   . pantoprazole (PROTONIX) 40 MG tablet      TAKE 1 TABLET DAILY   90 tablet   3   . topiramate (TOPAMAX) 25 MG tablet   Oral   Take 25 mg by mouth at bedtime.            Allergies Aspirin; Morphine and related; Adhesive; Clarithromycin; Codeine; Darvon; Demerol; and Talwin  Family History  Problem Relation Age of Onset  . Heart disease Mother     s/p stent  . Hypertension Mother   . Hypercholesterolemia Mother   . Diabetes Father   . Stomach cancer      uncle  . Breast cancer Maternal Aunt     Social History Social History  Substance Use Topics  . Smoking status: Former Smoker    Quit date: 06/13/2007  . Smokeless tobacco: Never Used  . Alcohol Use: No    Review of Systems  Constitutional: Negative for fever. Eyes: Negative for visual changes. ENT: Negative for sore throat Cardiovascular: Negative for chest pain. Respiratory: Negative for shortness of breath. Gastrointestinal: Negative for abdominal pain, vomiting and  diarrhea. Genitourinary: No dysuria Musculoskeletal: Negative for back pain. Skin: Negative for rash. Neurological: Negative for headaches or focal weakness Psychiatric: Mild anxiety    ____________________________________________   PHYSICAL EXAM:  VITAL SIGNS: ED Triage Vitals  Enc Vitals Group     BP 03/09/15 1020 134/50 mmHg     Pulse Rate 03/09/15 1020 72  Resp 03/09/15 1020 18     Temp 03/09/15 1020 98.3 F (36.8 C)     Temp Source 03/09/15 1020 Oral     SpO2 03/09/15 1020 98 %     Weight 03/09/15 1020 170 lb (77.111 kg)     Height 03/09/15 1020 '5\' 2"'$  (1.575 m)     Head Cir --      Peak Flow --      Pain Score 03/09/15 1021 8     Pain Loc --      Pain Edu? --      Excl. in [place]? --     Constitutional: Alert and oriented. Well appearing and in no distress. Eyes: Conjunctivae are normal.  ENT   Head: Normocephalic and atraumatic.   Mouth/Throat: Mucous membranes are moist. Cardiovascular: Normal rate, regular rhythm. Normal and symmetric distal pulses are present in all extremities.  Respiratory: Normal respiratory effort without tachypnea nor retractions.  Gastrointestinal: Soft and non-tender in all quadrants. No distention. There is no CVA tenderness. Genitourinary: No evidence of fistula, no evidence of stool in the vaginal canal no abnormalities noted Musculoskeletal: Nontender with normal range of motion in all extremities. . Neurologic:  Normal speech and language. No gross focal neurologic deficits are appreciated. Skin:  Skin is warm, dry and intact. No rash noted. Psychiatric: Mood and affect are normal. Patient exhibits appropriate insight and judgment.  ____________________________________________    LABS (pertinent positives/negatives)  Labs Reviewed  BASIC METABOLIC PANEL - Abnormal; Notable for the following:    Glucose, Bld 106 (*)    All other components within normal limits  URINALYSIS COMPLETEWITH MICROSCOPIC ([facility] ONLY) -  Abnormal; Notable for the following:    Color, Urine STRAW (*)    APPearance CLEAR (*)    Squamous Epithelial / LPF 0-5 (*)    All other components within normal limits  CBC    ____________________________________________   EKG  None  ____________________________________________    RADIOLOGY I have personally reviewed any xrays that were ordered on this patient: CT abdomen and pelvis limited by artifact from hip replacements although no acute abdomen on a noted  ____________________________________________   PROCEDURES  Procedure(s) performed: none  Critical Care performed: none  ____________________________________________   INITIAL IMPRESSION / ASSESSMENT AND PLAN / ED COURSE  Pertinent labs & imaging results that were available during my care of the patient were reviewed by me and considered in my medical decision making (see chart for details).  No evidence of abnormalities on pelvic exam, CT abdomen pelvis is unremarkable. Labs are benign. All vitals are normal. The patient follow-up with gynecology as I have not found any abnormalities today. Return precautions discussed with patient  ____________________________________________   FINAL CLINICAL IMPRESSION(S) / ED DIAGNOSES  Final diagnoses:  Dysuria     Lavonia Drafts, MD 03/10/15 1515

## 2015-03-10 ENCOUNTER — Telehealth: Payer: Self-pay | Admitting: *Deleted

## 2015-03-10 NOTE — Telephone Encounter (Signed)
Patient was seen in the emergency department on 03/09/15. Patient had bowel coming from her vagina. Patient was advised to follow up with her PCP within a week. Please advise a place on Dr. Bary Leriche schedule.

## 2015-03-10 NOTE — Telephone Encounter (Signed)
Patient can only be seen on mondays and due to holiday i scheduled on 03/23/15 at 845

## 2015-03-11 ENCOUNTER — Other Ambulatory Visit: Payer: Self-pay | Admitting: Family Medicine

## 2015-03-11 DIAGNOSIS — R197 Diarrhea, unspecified: Secondary | ICD-10-CM | POA: Diagnosis not present

## 2015-03-12 LAB — C. DIFFICILE GDH AND TOXIN A/B
C. difficile GDH: NOT DETECTED
C. difficile Toxin A/B: NOT DETECTED

## 2015-03-18 DIAGNOSIS — Z6831 Body mass index (BMI) 31.0-31.9, adult: Secondary | ICD-10-CM | POA: Diagnosis not present

## 2015-03-18 DIAGNOSIS — M4712 Other spondylosis with myelopathy, cervical region: Secondary | ICD-10-CM | POA: Diagnosis not present

## 2015-03-23 ENCOUNTER — Encounter: Payer: Self-pay | Admitting: Internal Medicine

## 2015-03-23 ENCOUNTER — Ambulatory Visit (INDEPENDENT_AMBULATORY_CARE_PROVIDER_SITE_OTHER): Payer: Medicare Other | Admitting: Internal Medicine

## 2015-03-23 ENCOUNTER — Ambulatory Visit: Payer: Medicare Other | Admitting: Family Medicine

## 2015-03-23 VITALS — BP 120/69 | HR 77 | Temp 98.3°F | Resp 18 | Ht 62.0 in | Wt 173.2 lb

## 2015-03-23 DIAGNOSIS — R0789 Other chest pain: Secondary | ICD-10-CM | POA: Diagnosis not present

## 2015-03-23 DIAGNOSIS — R159 Full incontinence of feces: Secondary | ICD-10-CM

## 2015-03-23 DIAGNOSIS — M4722 Other spondylosis with radiculopathy, cervical region: Secondary | ICD-10-CM

## 2015-03-23 DIAGNOSIS — K219 Gastro-esophageal reflux disease without esophagitis: Secondary | ICD-10-CM | POA: Diagnosis not present

## 2015-03-23 NOTE — Progress Notes (Signed)
Pre-visit discussion using our clinic review tool. No additional management support is needed unless otherwise documented below in the visit note.  

## 2015-03-23 NOTE — Progress Notes (Signed)
Patient ID: Jennifer Hodges, female   DOB: 31-Jan-1946, 70 y.o.   MRN: [id number]   Subjective:    Patient ID: Jennifer Hodges, female    DOB: 12-Dec-1945, 70 y.o.   MRN: [id number]  HPI  Patient with past history of hypercholesterolemia, GERD and recent neck surgery.  She comes in today for an ER follow up.  She was recently seen with concerns regarding noticing stool passage with urination.  See ER note for details.  She is still noticing this intermittently.  Last noticed last week.  Had small bowel movement this am.  Has been having some issues with some constipation.   Regulates this with some home remedies.  No abdominal pain or cramping.  She has previously had vaginal wall surgery and bladder surgery (By Dr Rayford Halsted and Dr [name]).  She also reports persistent increased acid reflux.  Taking protonix.  This has worsened.  Is eating.  No vomiting.  Notices some chest pressure.  She relates to GERD and to her constipation.     Past Medical History  Diagnosis Date  . Hypercholesterolemia   . Bronchiectasis (HCC)     mild  . Asthma   . GERD (gastroesophageal reflux disease)     EGD 8/09- non bleeding erosive gastritis, documentd esophageal ulcerations.   . Diverticulosis   . Hiatal hernia   . Osteoarthritis     lumbar disc disease, left hip  . Chronic headaches      followed by [facility] migraines  . PONV (postoperative nausea and vomiting)     "Only with last hip and I believe it was due to the morphine"   . Family history of adverse reaction to anesthesia     PONV  . COPD (chronic obstructive pulmonary disease) ([place])   . History of pneumonia   . Diabetes mellitus without complication [facility])     "doctor says I no longer have diabetes"    . Sleep apnea     cpap asked to bring mask and tubing  . Weakness of right side of body   . Gall stones     history of   Past Surgical History  Procedure Laterality Date  . Appendectomy    . Cholecystectomy    . Abdominal hysterectomy  age 39    . Right oophorectomy    . Excisional hemorrhoidectomy    . Lumbar laminectomy    . Eus N/A 05/31/2012    Procedure: UPPER ENDOSCOPIC ULTRASOUND (EUS) LINEAR;  Surgeon: [name], MD;  Location: [facility];  Service: Endoscopy;  Laterality: N/A;  . Total hip arthroplasty Left 05/01/2014    Dr. [name]  . Breast surgery Bilateral     cyst removed and reduction  . Back surgery      4th lumbar fusion  . Bladder surgery N/A     with vaginal wall repair  . Cataract extraction w/ intraocular lens implant Bilateral 2015  . Total hip arthroplasty Right 08/05/2014    Procedure: TOTAL HIP ARTHROPLASTY ANTERIOR APPROACH;  Surgeon: [name], MD;  Location: [facility] ORS;  Service: Orthopedics;  Laterality: Right;  . Reduction mammaplasty  1990  . Cardiac catheterization  2014  . Anterior cervical decomp/discectomy fusion N/A 02/24/2015    Procedure: CERVICAL FOUR-FIVE, CERVICAL FIVE-SIX, CERVICAL SIX-SEVEN ANTERIOR CERVICAL DECOMPRESSION/DISCECTOMY FUSION ;  Surgeon: [name], MD;  Location: [facility] [facility];  Service: Neurosurgery;  Laterality: N/A;  C45 C56 C67 anterior cervical decompression with fusion interbody prosthesis plating and bonegraft  Family History  Problem Relation Age of Onset  . Heart disease Mother     s/p stent  . Hypertension Mother   . Hypercholesterolemia Mother   . Diabetes Father   . Stomach cancer      uncle  . Breast cancer Maternal Aunt    Social History   Social History  . Marital Status: Married    Spouse Name: N/A  . Number of Children: N/A  . Years of Education: N/A   Social History Main Topics  . Smoking status: Former Smoker    Quit date: 06/13/2007  . Smokeless tobacco: Never Used  . Alcohol Use: No  . Drug Use: No  . Sexual Activity: Yes    Birth Control/ Protection: Surgical   Other Topics Concern  . None   Social History Narrative    Outpatient Encounter Prescriptions as of 03/23/2015  Medication Sig  . acetaminophen  (TYLENOL) 500 MG tablet Take 500 mg by mouth every 8 (eight) hours as needed for mild pain or moderate pain.  . Cholecalciferol (VITAMIN D PO) Take 800 Units by mouth.  . Coenzyme Q10 (CO Q 10) 100 MG CAPS Take 100 mg by mouth every morning.   . Garlic 2542 MG CAPS Take 2 capsules by mouth every morning.   Marland Kitchen guaiFENesin (MUCINEX) 600 MG 12 hr tablet Take 600 mg by mouth daily.  . ondansetron (ZOFRAN) 4 MG tablet Take 1 tablet (4 mg total) by mouth every 8 (eight) hours as needed for nausea or vomiting.  . pantoprazole (PROTONIX) 40 MG tablet Take 1 tablet (40 mg total) by mouth 2 (two) times daily before a meal.  . [DISCONTINUED] pantoprazole (PROTONIX) 40 MG tablet TAKE 1 TABLET DAILY  . [DISCONTINUED] amoxicillin (AMOXIL) 875 MG tablet TAKE 1 TABLET BY MOUTH TWICE DAILY  . [DISCONTINUED] diazepam (VALIUM) 5 MG tablet Take 1 tablet (5 mg total) by mouth every 6 (six) hours as needed for muscle spasms.  . [DISCONTINUED] topiramate (TOPAMAX) 25 MG tablet Take 25 mg by mouth at bedtime.    No facility-administered encounter medications on file as of 03/23/2015.    Review of Systems  Constitutional: Negative for appetite change and unexpected weight change.  HENT: Negative for congestion and sinus pressure.   Respiratory: Positive for chest tightness. Negative for cough and shortness of breath.   Gastrointestinal: Positive for constipation. Negative for nausea, vomiting, abdominal pain and diarrhea.       Increased acid reflux as outlined.    Genitourinary: Negative for dysuria.       Noticed passage of stool with urination.    Musculoskeletal: Negative for back pain and joint swelling.       Doing well s/p neck surgery.    Skin: Negative for color change and rash.  Neurological: Negative for dizziness, light-headedness and headaches.  Psychiatric/Behavioral: Negative for dysphoric mood and agitation.       Objective:    Physical Exam  Constitutional: She appears well-developed and  well-nourished. No distress.  Neck: Neck supple.  Well healed incision site.   Cardiovascular: Normal rate and regular rhythm.   Pulmonary/Chest: Breath sounds normal. No respiratory distress. She has no wheezes.  Abdominal: Soft. Bowel sounds are normal. There is no tenderness.  Genitourinary:  Had pelvic exam in ER.  Desires not to repeat today.   Musculoskeletal: She exhibits no edema or tenderness.  Lymphadenopathy:    She has no cervical adenopathy.  Skin: No rash noted. No erythema.  Psychiatric: She has a  normal mood and affect. Her behavior is normal.    BP 120/69 mmHg  Pulse 77  Temp(Src) 98.3 F (36.8 C) (Oral)  Resp 18  Ht '5\' 2"'$  (1.575 m)  Wt 173 lb 4 oz (78.586 kg)  BMI 31.68 kg/m2  SpO2 96% Wt Readings from Last 3 Encounters:  03/23/15 173 lb 4 oz (78.586 kg)  03/09/15 170 lb (77.111 kg)  03/04/15 170 lb 9.6 oz (77.384 kg)     Lab Results  Component Value Date   WBC 7.2 03/09/2015   HGB 12.4 03/09/2015   HCT 37.3 03/09/2015   PLT 397 03/09/2015   GLUCOSE 106* 03/09/2015   CHOL 191 11/14/2014   TRIG 120.0 11/14/2014   HDL 49.80 11/14/2014   LDLDIRECT 147.5 11/07/2012   LDLCALC 117* 11/14/2014   ALT 12 11/14/2014   AST 19 11/14/2014   NA 139 03/09/2015   K 4.0 03/09/2015   CL 104 03/09/2015   CREATININE 0.44 03/09/2015   BUN 8 03/09/2015   CO2 28 03/09/2015   TSH 2.53 11/14/2014   INR 0.92 07/22/2014   HGBA1C 5.7* 02/18/2015    Ct Abdomen Pelvis W Contrast  03/09/2015  CLINICAL DATA:  Patient states is having bowel movement from urethra history of prolapse surgery 6 years ago EXAM: CT ABDOMEN AND PELVIS WITH CONTRAST TECHNIQUE: Multidetector CT imaging of the abdomen and pelvis was performed using the standard protocol following bolus administration of intravenous contrast. CONTRAST:  117m OMNIPAQUE IOHEXOL 300 MG/ML  SOLN COMPARISON:  CT scan 02/13/2012 FINDINGS: Sagittal images of the spine shows diffuse osteopenia. Disc space flattening with  vacuum disc phenomenon at L4-L5 level. Mild intrahepatic biliary ductal dilatation probable postcholecystectomy. The lung bases are unremarkable. Small hiatal hernia. No focal hepatic mass. The pancreas, spleen and adrenal glands are unremarkable. Kidneys are symmetrical in size and enhancement. No hydronephrosis or hydroureter. Atherosclerotic calcifications of abdominal aorta and iliac arteries. No aortic aneurysm. No small bowel obstruction. No ascites or free air. No adenopathy. Some colonic stool noted in transverse colon descending colon and rectosigmoid colon. Degenerative changes are noted pubic symphysis. Extensive metallic artifact are noted within pelvis from bilateral hip prosthesis. There is limited visualization of the bladder and urethra. The urinary bladder and base of the urethra are obscured. No pelvic ascites or adenopathy. Degenerative changes pubic symphysis. Mild degenerative changes bilateral SI joints. There is no pericecal inflammation. The patient is status post appendectomy. No mesenteric or pelvic fluid collection. Delayed renal images shows bilateral renal symmetrical excretion. Bilateral visualized proximal ureter is unremarkable. The patient is status post hysterectomy. IMPRESSION: 1. Extensive metallic artifacts from bilateral hip prosthesis limits evaluation of the pelvis. The urinary bladder and base of urethra are obscured by metallic artifacts. 2. No small bowel or colonic obstruction. No mesenteric or pelvic fluid collection. 3. No hydronephrosis or hydroureter. 4. No pericecal inflammation.  Status post appendectomy. 5. Status post hysterectomy. Electronically Signed   By: LLahoma CrockerM.D.   On: 03/09/2015 15:00       Assessment & Plan:   Problem List Items Addressed This Visit    Cervical spondylosis with radiculopathy    S/p neck surgery.  Doing well.        Chest tightness    Occurs when she is sitting.  She associates with increased reflux and constipation.  Does  not feel from cardiac source.  Declines EKG.  Increase protonix as outlined.  Refer to GI.        GERD (gastroesophageal reflux  disease) - Primary    Persistent increased reflux.  On protonix.  Increased protonix to bid.  Refer to GI given persistent worsening symptoms despite medication for question of need for EGD.        Relevant Medications   pantoprazole (PROTONIX) 40 MG tablet   Other Relevant Orders   Ambulatory referral to Gastroenterology   Stool incontinence    Has noticed stool passage through her vagina when she urinates.  See ER note for details.  They performed pelvic exam and per her report found no stool in the vaginal vault.  CT as outlined.  Declined pelvic exam today.  Request referral to gyn.  Has a remote history of vaginal wall surgery and bladder surgery.        Relevant Orders   Ambulatory referral to Gynecology       Einar Pheasant, MD

## 2015-03-23 NOTE — Patient Instructions (Signed)
Take protonix twice a day.  Take one 30 minutes before breakfast and 30 minutes before your evening meal.

## 2015-03-24 ENCOUNTER — Encounter: Payer: Self-pay | Admitting: Internal Medicine

## 2015-03-24 DIAGNOSIS — R0789 Other chest pain: Secondary | ICD-10-CM | POA: Insufficient documentation

## 2015-03-24 DIAGNOSIS — R159 Full incontinence of feces: Secondary | ICD-10-CM | POA: Insufficient documentation

## 2015-03-24 MED ORDER — PANTOPRAZOLE SODIUM 40 MG PO TBEC: 40.0000 mg | DELAYED_RELEASE_TABLET | Freq: Two times a day (BID) | ORAL | Status: DC

## 2015-03-24 NOTE — Assessment & Plan Note (Signed)
Has noticed stool passage through her vagina when she urinates.  See ER note for details.  They performed pelvic exam and per her report found no stool in the vaginal vault.  CT as outlined.  Declined pelvic exam today.  Request referral to gyn.  Has a remote history of vaginal wall surgery and bladder surgery.

## 2015-03-24 NOTE — Assessment & Plan Note (Signed)
Occurs when she is sitting.  She associates with increased reflux and constipation.  Does not feel from cardiac source.  Declines EKG.  Increase protonix as outlined.  Refer to GI.

## 2015-03-24 NOTE — Assessment & Plan Note (Signed)
S/p neck surgery.  Doing well.

## 2015-03-24 NOTE — Assessment & Plan Note (Signed)
Persistent increased reflux.  On protonix.  Increased protonix to bid.  Refer to GI given persistent worsening symptoms despite medication for question of need for EGD.

## 2015-03-26 DIAGNOSIS — K581 Irritable bowel syndrome with constipation: Secondary | ICD-10-CM | POA: Diagnosis not present

## 2015-03-30 ENCOUNTER — Ambulatory Visit: Payer: Medicare Other | Admitting: Internal Medicine

## 2015-04-02 DIAGNOSIS — N828 Other female genital tract fistulae: Secondary | ICD-10-CM | POA: Diagnosis not present

## 2015-04-06 ENCOUNTER — Ambulatory Visit (INDEPENDENT_AMBULATORY_CARE_PROVIDER_SITE_OTHER): Payer: Medicare Other

## 2015-04-06 VITALS — BP 118/66 | HR 93 | Temp 98.1°F | Resp 14 | Ht 62.0 in | Wt 174.8 lb

## 2015-04-06 DIAGNOSIS — Z0000 Encounter for general adult medical examination without abnormal findings: Secondary | ICD-10-CM | POA: Diagnosis not present

## 2015-04-06 DIAGNOSIS — Z23 Encounter for immunization: Secondary | ICD-10-CM

## 2015-04-06 DIAGNOSIS — Z1159 Encounter for screening for other viral diseases: Secondary | ICD-10-CM | POA: Diagnosis not present

## 2015-04-06 NOTE — Progress Notes (Signed)
Subjective:   Jennifer Hodges is a 70 y.o. female who presents for an Initial Medicare Annual Wellness Visit.  Review of Systems    No ROS.  Medicare Wellness Visit.  Cardiac Risk Factors include: advanced age (>69mn, >>45women)     Objective:    Today's Vitals   04/06/15 1124  BP: 118/66  Pulse: 93  Temp: 98.1 F (36.7 C)  TempSrc: Oral  Resp: 14  Height: '5\' 2"'$  (1.575 m)  Weight: 174 lb 12.8 oz (79.289 kg)  SpO2: 97%    Current Medications (verified) Outpatient Encounter Prescriptions as of 04/06/2015  Medication Sig  . Cholecalciferol (VITAMIN D PO) Take 800 Units by mouth.  . Coenzyme Q10 (CO Q 10) 100 MG CAPS Take 100 mg by mouth every morning.   . Garlic 11638MG CAPS Take 2 capsules by mouth every morning.   . pantoprazole (PROTONIX) 40 MG tablet Take 1 tablet (40 mg total) by mouth 2 (two) times daily before a meal.  . acetaminophen (TYLENOL) 500 MG tablet Take 500 mg by mouth every 8 (eight) hours as needed for mild pain or moderate pain. Reported on 04/06/2015  . guaiFENesin (MUCINEX) 600 MG 12 hr tablet Take 600 mg by mouth daily. Reported on 04/06/2015  . ondansetron (ZOFRAN) 4 MG tablet Take 1 tablet (4 mg total) by mouth every 8 (eight) hours as needed for nausea or vomiting. (Patient not taking: Reported on 04/06/2015)   No facility-administered encounter medications on file as of 04/06/2015.    Allergies (verified) Aspirin; Morphine and related; Adhesive; Clarithromycin; Codeine; Darvon; Demerol; and Talwin   History: Past Medical History  Diagnosis Date  . Hypercholesterolemia   . Bronchiectasis (HCC)     mild  . Asthma   . GERD (gastroesophageal reflux disease)     EGD 8/09- non bleeding erosive gastritis, documentd esophageal ulcerations.   . Diverticulosis   . Hiatal hernia   . Osteoarthritis     lumbar disc disease, left hip  . Chronic headaches      followed by [facility] migraines  . PONV (postoperative nausea and vomiting)     "Only  with last hip and I believe it was due to the morphine"   . Family history of adverse reaction to anesthesia     PONV  . COPD (chronic obstructive pulmonary disease) ([facility]   . History of pneumonia   . Diabetes mellitus without complication ([facility]     "doctor says I no longer have diabetes"    . Sleep apnea     cpap asked to bring mask and tubing  . Weakness of right side of body   . Gall stones     history of   Past Surgical History  Procedure Laterality Date  . Appendectomy    . Cholecystectomy    . Abdominal hysterectomy  age 70 . Right oophorectomy    . Excisional hemorrhoidectomy    . Lumbar laminectomy    . Eus N/A 05/31/2012    Procedure: UPPER ENDOSCOPIC ULTRASOUND (EUS) LINEAR;  Surgeon: DMilus Banister MD;  Location: [facility];  Service: Endoscopy;  Laterality: N/A;  . Total hip arthroplasty Left 05/01/2014    Dr. MRevonda Humphrey . Breast surgery Bilateral     cyst removed and reduction  . Back surgery      4th lumbar fusion  . Bladder surgery N/A     with vaginal wall repair  . Cataract extraction w/ intraocular lens implant Bilateral  2015  . Total hip arthroplasty Right 08/05/2014    Procedure: TOTAL HIP ARTHROPLASTY ANTERIOR APPROACH;  Surgeon: [name], MD;  Location: [facility] ORS;  Service: Orthopedics;  Laterality: Right;  . Reduction mammaplasty  1990  . Cardiac catheterization  2014  . Anterior cervical decomp/discectomy fusion N/A 02/24/2015    Procedure: CERVICAL FOUR-FIVE, CERVICAL FIVE-SIX, CERVICAL SIX-SEVEN ANTERIOR CERVICAL DECOMPRESSION/DISCECTOMY FUSION ;  Surgeon: [name], MD;  Location: [place] [facility];  Service: Neurosurgery;  Laterality: N/A;  C45 C56 C67 anterior cervical decompression with fusion interbody prosthesis plating and bonegraft   Family History  Problem Relation Age of Onset  . Heart disease Mother     s/p stent  . Hypertension Mother   . Hypercholesterolemia Mother   . Diabetes Father   . Stomach cancer      uncle  .  Breast cancer Maternal Aunt    Social History   Occupational History  . Not on file.   Social History Main Topics  . Smoking status: Former Smoker    Quit date: 06/13/2007  . Smokeless tobacco: Never Used  . Alcohol Use: No  . Drug Use: No  . Sexual Activity: No    Tobacco Counseling Counseling given: Not Answered   Activities of Daily Living In your present state of health, do you have any difficulty performing the following activities: 04/06/2015 02/24/2015  Hearing? N N  Vision? N N  Difficulty concentrating or making decisions? N N  Walking or climbing stairs? Y Y  Dressing or bathing? N Y  Doing errands, shopping? N N  Preparing Food and eating ? N -  Using the Toilet? N -  In the past six months, have you accidently leaked urine? Y -  Do you have problems with loss of bowel control? N -  Managing your Medications? N -  Managing your Finances? N -  Housekeeping or managing your Housekeeping? N -    Immunizations and Health Maintenance Immunization History  Administered Date(s) Administered  . Pneumococcal Conjugate-13 04/06/2015   Health Maintenance Due  Topic Date Due  . Samul Dada  04/15/1964    Patient Care Team: Einar Pheasant, MD as PCP - General (Internal Medicine)  Indicate any recent Medical Services you may have received from other than [facility] providers in the past year (date may be approximate).     Assessment:   This is a routine wellness examination for Jennifer Hodges.  The goal of the wellness visit is to assist the patient how to close the gaps in care and create a preventative care plan for the patient.   Osteoporosis risk reviewed.  Medications reviewed; taking without issues or barriers.   Safety issues reviewed; smoke detectors in the home. No firearms in the home. Wears seatbelts when driving or riding with others. No violence in the home.  No identified risk were noted; The patient was oriented x 3; appropriate in dress and manner  and no objective failures at ADL's or IADL's.   CPAP; wears every night as appropriate.    Bronchiectas w/o ac-stable and followed by Dr. [name] Brochiectasis, unspecified-stable and followed by Dr. [name]  Patient Concerns:  None at this time.  Follow up with PCP as needed.   Hearing/Vision screen Hearing Screening Comments: Passes the whisper test Vision Screening Comments: Followed by [facility], Dr. Stephenie Acres Wears glasses Bilateral cataracts removed Annual visits Last OV 08/2014  Dietary issues and exercise activities discussed: Current Exercise Habits:: The patient does not participate in regular exercise at  present  Goals    . Increase physical activity     Do standing exercises for the hip, as demonstrated, as tolerated.  Education provided. Walk and use the exercise bike as often as possible.      Depression Screen PHQ 2/9 Scores 04/06/2015 11/14/2014 05/27/2014 12/21/2012  PHQ - 2 Score 0 0 0 0    Fall Risk Fall Risk  04/06/2015 11/14/2014 05/27/2014 12/21/2012  Falls in the past year? Yes No No No  Number falls in past yr: 1 - - -  Injury with Fall? No - - -  Follow up Education provided;Falls prevention discussed - - -    Cognitive Function: MMSE - Mini Mental State Exam 04/06/2015  Orientation to time 5  Orientation to Place 5  Registration 3  Attention/ Calculation 5  Recall 3  Language- name 2 objects 2  Language- repeat 1  Language- follow 3 step command 3  Language- read & follow direction 1  Write a sentence 1  Copy design 1  Total score 30    Screening Tests Health Maintenance  Topic Date Due  . TETANUS/TDAP  04/15/1964  . INFLUENZA VACCINE  06/11/2105 (Originally 10/13/2014)  . PNA vac Low Risk Adult (2 of 2 - PPSV23) 04/05/2016  . MAMMOGRAM  11/23/2016  . COLONOSCOPY  01/09/2022  . DEXA SCAN  Completed  . ZOSTAVAX  Completed  . Hepatitis C Screening  Completed      Plan:   End of life planning; Advance aging; Advanced  directives discussed. No Copy of HCPOA/Living.  Educational material provided.  Plans to discuss with husband and follow up with PCP.  During the course of the visit, Jennifer Hodges was educated and counseled about the following appropriate screening and preventive services:   Vaccines to include Pneumoccal, Influenza, Hepatitis B, Td, Zostavax, HCV  Electrocardiogram  Cardiovascular disease screening  Colorectal cancer screening  Bone density screening  Diabetes screening  Glaucoma screening  Mammography/PAP  Nutrition counseling  Smoking cessation counseling  Patient Instructions (the written plan) were given to the patient.    Varney Biles, LPN   1/62/4469    Reviewed above information.  Agree with plan.   Dr Nicki Reaper

## 2015-04-06 NOTE — Patient Instructions (Addendum)
Jennifer Hodges,  Thank you for taking time to come for your Medicare Wellness Visit.  I appreciate your ongoing commitment to your health goals. Please review the following plan we discussed and let me know if I can assist you in the future.  Follow up with Dr. Nicki Reaper as needed.  Fall Prevention in the Home  Falls can cause injuries. They can happen to people of all ages. There are many things you can do to make your home safe and to help prevent falls.  WHAT CAN I DO ON THE OUTSIDE OF MY HOME?  Regularly fix the edges of walkways and driveways and fix any cracks.  Remove anything that might make you trip as you walk through a door, such as a raised step or threshold.  Trim any bushes or trees on the path to your home.  Use bright outdoor lighting.  Clear any walking paths of anything that might make someone trip, such as rocks or tools.  Regularly check to see if handrails are loose or broken. Make sure that both sides of any steps have handrails.  Any raised decks and porches should have guardrails on the edges.  Have any leaves, snow, or ice cleared regularly.  Use sand or salt on walking paths during winter.  Clean up any spills in your garage right away. This includes oil or grease spills. WHAT CAN I DO IN THE BATHROOM?   Use night lights.  Install grab bars by the toilet and in the tub and shower. Do not use towel bars as grab bars.  Use non-skid mats or decals in the tub or shower.  If you need to sit down in the shower, use a plastic, non-slip stool.  Keep the floor dry. Clean up any water that spills on the floor as soon as it happens.  Remove soap buildup in the tub or shower regularly.  Attach bath mats securely with double-sided non-slip rug tape.  Do not have throw rugs and other things on the floor that can make you trip. WHAT CAN I DO IN THE BEDROOM?  Use night lights.  Make sure that you have a light by your bed that is easy to reach.  Do not use any  sheets or blankets that are too big for your bed. They should not hang down onto the floor.  Have a firm chair that has side arms. You can use this for support while you get dressed.  Do not have throw rugs and other things on the floor that can make you trip. WHAT CAN I DO IN THE KITCHEN?  Clean up any spills right away.  Avoid walking on wet floors.  Keep items that you use a lot in easy-to-reach places.  If you need to reach something above you, use a strong step stool that has a grab bar.  Keep electrical cords out of the way.  Do not use floor polish or wax that makes floors slippery. If you must use wax, use non-skid floor wax.  Do not have throw rugs and other things on the floor that can make you trip. WHAT CAN I DO WITH MY STAIRS?  Do not leave any items on the stairs.  Make sure that there are handrails on both sides of the stairs and use them. Fix handrails that are broken or loose. Make sure that handrails are as long as the stairways.  Check any carpeting to make sure that it is firmly attached to the stairs. Fix any carpet  that is loose or worn.  Avoid having throw rugs at the top or bottom of the stairs. If you do have throw rugs, attach them to the floor with carpet tape.  Make sure that you have a light switch at the top of the stairs and the bottom of the stairs. If you do not have them, ask someone to add them for you. WHAT ELSE CAN I DO TO HELP PREVENT FALLS?  Wear shoes that:  Do not have high heels.  Have rubber bottoms.  Are comfortable and fit you well.  Are closed at the toe. Do not wear sandals.  If you use a stepladder:  Make sure that it is fully opened. Do not climb a closed stepladder.  Make sure that both sides of the stepladder are locked into place.  Ask someone to hold it for you, if possible.  Clearly mark and make sure that you can see:  Any grab bars or handrails.  First and last steps.  Where the edge of each step  is.  Use tools that help you move around (mobility aids) if they are needed. These include:  Canes.  Walkers.  Scooters.  Crutches.  Turn on the lights when you go into a dark area. Replace any light bulbs as soon as they burn out.  Set up your furniture so you have a clear path. Avoid moving your furniture around.  If any of your floors are uneven, fix them.  If there are any pets around you, be aware of where they are.  Review your medicines with your doctor. Some medicines can make you feel dizzy. This can increase your chance of falling. Ask your doctor what other things that you can do to help prevent falls.   This information is not intended to replace advice given to you by your health care provider. Make sure you discuss any questions you have with your health care provider.   Document Released: 12/25/2008 Document Revised: 07/15/2014 Document Reviewed: 04/04/2014 Elsevier Interactive Patient Education [employer].

## 2015-04-07 ENCOUNTER — Encounter: Payer: Self-pay | Admitting: *Deleted

## 2015-04-07 ENCOUNTER — Encounter: Payer: Self-pay | Admitting: Obstetrics and Gynecology

## 2015-04-07 LAB — HEPATITIS C ANTIBODY: HCV Ab: NEGATIVE

## 2015-04-10 ENCOUNTER — Telehealth: Payer: Self-pay | Admitting: *Deleted

## 2015-04-10 NOTE — Telephone Encounter (Signed)
Patient requested a call from Dr. Nicki Reaper, she would not list reason for call. She stated "this is between the doctor and no one else". Contact  [phone]

## 2015-04-10 NOTE — Telephone Encounter (Signed)
Please let her know that I am seeing pts and need information prior to calling.  Need to confirm nothing acute going on.

## 2015-04-10 NOTE — Telephone Encounter (Signed)
Called pt no able to leave voicemail

## 2015-04-13 ENCOUNTER — Telehealth: Payer: Self-pay

## 2015-04-13 NOTE — Telephone Encounter (Signed)
Called pt No Voicemail Setup

## 2015-04-13 NOTE — Telephone Encounter (Signed)
FYI: Pt states that she received a pneumonia vaccine in Right arm near shoulder, pt states that its sore at the bone x1week, and cannot lift arm. Pt states that she had to take oxycodone to help with the pain. Please advise

## 2015-04-13 NOTE — Telephone Encounter (Signed)
LMOMTCB

## 2015-04-13 NOTE — Telephone Encounter (Signed)
If she is still having problems, we can look at her arm.  Is it red, swollen, ?  I can see her on Thursday in an open spot.  If feel needs evaluation earlier, can see if anyone else could look at her arm.

## 2015-04-14 NOTE — Telephone Encounter (Signed)
LMOMTCB

## 2015-04-16 ENCOUNTER — Encounter: Payer: Self-pay | Admitting: Internal Medicine

## 2015-04-16 ENCOUNTER — Ambulatory Visit (INDEPENDENT_AMBULATORY_CARE_PROVIDER_SITE_OTHER): Payer: Medicare Other | Admitting: Internal Medicine

## 2015-04-16 VITALS — BP 147/69 | HR 84 | Temp 98.5°F | Resp 18 | Ht 62.0 in | Wt 176.8 lb

## 2015-04-16 DIAGNOSIS — M79601 Pain in right arm: Secondary | ICD-10-CM | POA: Diagnosis not present

## 2015-04-16 MED ORDER — OXYCODONE-ACETAMINOPHEN 5-325 MG PO TABS: 1.0000 | ORAL_TABLET | Freq: Every evening | ORAL | Status: DC | PRN

## 2015-04-16 NOTE — Progress Notes (Signed)
Patient ID: Jennifer Hodges, female   DOB: [birth year], 70 y.o.   MRN: [id number]   Subjective:    Patient ID: Jennifer Hodges, female    DOB: [birth year], 70 y.o.   MRN: [id number]  HPI  Patient here as a work in with persistent arm soreness s/p pneumonia injection.  States received pneumonia shot two weeks ago.  States was concerned when shot was given about location.  Also reported increased pain with injection.  Her arm has been sore since the injection.  States she is unable to lift her arm.  Has been taking tylenol.  Had some oxycodone left over from her previous surgery.  Taking this at night, to try and get sleep.  States was swollen.  Swelling is better now.  No increased erythema.     Past Medical History  Diagnosis Date  . Hypercholesterolemia   . Bronchiectasis (HCC)     mild  . Asthma   . GERD (gastroesophageal reflux disease)     EGD 8/09- non bleeding erosive gastritis, documentd esophageal ulcerations.   . Diverticulosis   . Hiatal hernia   . Osteoarthritis     lumbar disc disease, left hip  . Chronic headaches      followed by [facility] migraines  . PONV (postoperative nausea and vomiting)     "Only with last hip and I believe it was due to the morphine"   . Family history of adverse reaction to anesthesia     PONV  . COPD (chronic obstructive pulmonary disease) ([place])   . History of pneumonia   . Diabetes mellitus without complication [facility])     "doctor says I no longer have diabetes"    . Sleep apnea     cpap asked to bring mask and tubing  . Weakness of right side of body   . Gall stones     history of   Past Surgical History  Procedure Laterality Date  . Appendectomy    . Cholecystectomy    . Abdominal hysterectomy  age 71  . Right oophorectomy    . Excisional hemorrhoidectomy    . Lumbar laminectomy    . Eus N/A 05/31/2012    Procedure: UPPER ENDOSCOPIC ULTRASOUND (EUS) LINEAR;  Surgeon: [name], MD;  Location: [facility];  Service: Endoscopy;   Laterality: N/A;  . Total hip arthroplasty Left 05/01/2014    Dr. [name]  . Breast surgery Bilateral     cyst removed and reduction  . Back surgery      4th lumbar fusion  . Bladder surgery N/A     with vaginal wall repair  . Cataract extraction w/ intraocular lens implant Bilateral 2015  . Total hip arthroplasty Right 08/05/2014    Procedure: TOTAL HIP ARTHROPLASTY ANTERIOR APPROACH;  Surgeon: [name], MD;  Location: [facility] ORS;  Service: Orthopedics;  Laterality: Right;  . Reduction mammaplasty  1990  . Cardiac catheterization  2014  . Anterior cervical decomp/discectomy fusion N/A 02/24/2015    Procedure: CERVICAL FOUR-FIVE, CERVICAL FIVE-SIX, CERVICAL SIX-SEVEN ANTERIOR CERVICAL DECOMPRESSION/DISCECTOMY FUSION ;  Surgeon: [name], MD;  Location: [place] [facility];  Service: Neurosurgery;  Laterality: N/A;  C45 C56 C67 anterior cervical decompression with fusion interbody prosthesis plating and bonegraft   Family History  Problem Relation Age of Onset  . Heart disease Mother     s/p stent  . Hypertension Mother   . Hypercholesterolemia Mother   . Diabetes Father   . Stomach cancer  uncle  . Breast cancer Maternal Aunt    Social History   Social History  . Marital Status: Married    Spouse Name: N/A  . Number of Children: N/A  . Years of Education: N/A   Social History Main Topics  . Smoking status: Former Smoker    Quit date: 06/13/2007  . Smokeless tobacco: Never Used  . Alcohol Use: No  . Drug Use: No  . Sexual Activity: No   Other Topics Concern  . None   Social History Narrative    Outpatient Encounter Prescriptions as of 04/16/2015  Medication Sig  . acetaminophen (TYLENOL) 500 MG tablet Take 500 mg by mouth every 8 (eight) hours as needed for mild pain or moderate pain. Reported on 04/06/2015  . Cholecalciferol (VITAMIN D PO) Take 800 Units by mouth.  . Coenzyme Q10 (CO Q 10) 100 MG CAPS Take 100 mg by mouth every morning.   . Garlic 2992  MG CAPS Take 2 capsules by mouth every morning.   Marland Kitchen guaiFENesin (MUCINEX) 600 MG 12 hr tablet Take 600 mg by mouth daily. Reported on 04/06/2015  . ondansetron (ZOFRAN) 4 MG tablet Take 1 tablet (4 mg total) by mouth every 8 (eight) hours as needed for nausea or vomiting.  . pantoprazole (PROTONIX) 40 MG tablet Take 1 tablet (40 mg total) by mouth 2 (two) times daily before a meal.  . oxyCODONE-acetaminophen (ROXICET) 5-325 MG tablet Take 1 tablet by mouth at bedtime as needed for severe pain.   No facility-administered encounter medications on file as of 04/16/2015.    Review of Systems  Constitutional: Negative for fever and appetite change.  HENT: Negative for congestion and sinus pressure.   Respiratory: Negative for cough and shortness of breath.   Musculoskeletal:       Increased shoulder pain as outlined.  Right shoulder.  Unable to lift arm.  Increased pain with lifting arm.  Unable to left arm.    Skin: Negative for color change and rash.       Swelling has improved.         Objective:    Physical Exam  Constitutional: She appears well-developed and well-nourished. No distress.  Neck: Neck supple.  Cardiovascular: Normal rate and regular rhythm.   Pulmonary/Chest: Breath sounds normal. No respiratory distress. She has no wheezes.  Abdominal: Bowel sounds are normal.  Musculoskeletal:  Increased pain to palpation - just above deltoid - right shoulder.  No increased soft tissue swelling.  Increased pain with attempts at abduction and lifting the arm. No increased erythema.    Lymphadenopathy:    She has no cervical adenopathy.  Skin: No rash noted. No erythema.    BP 147/69 mmHg  Pulse 84  Temp(Src) 98.5 F (36.9 C) (Oral)  Resp 18  Ht '5\' 2"'$  (1.575 m)  Wt 176 lb 12 oz (80.173 kg)  BMI 32.32 kg/m2  SpO2 96% Wt Readings from Last 3 Encounters:  04/18/15 176 lb (79.833 kg)  04/16/15 176 lb 12 oz (80.173 kg)  04/06/15 174 lb 12.8 oz (79.289 kg)     Lab Results    Component Value Date   WBC 7.2 03/09/2015   HGB 12.4 03/09/2015   HCT 37.3 03/09/2015   PLT 397 03/09/2015   GLUCOSE 106* 03/09/2015   CHOL 191 11/14/2014   TRIG 120.0 11/14/2014   HDL 49.80 11/14/2014   LDLDIRECT 147.5 11/07/2012   LDLCALC 117* 11/14/2014   ALT 12 11/14/2014   AST 19 11/14/2014  NA 139 03/09/2015   K 4.0 03/09/2015   CL 104 03/09/2015   CREATININE 0.44 03/09/2015   BUN 8 03/09/2015   CO2 28 03/09/2015   TSH 2.53 11/14/2014   INR 0.92 07/22/2014   HGBA1C 5.7* 02/18/2015        Assessment & Plan:   Problem List Items Addressed This Visit    Right arm pain - Primary    Right arm and shoulder pain - persistent and worsening.  Unable to lift arm.  Unable to take antiinflammatories.  Took some oxycodone left over from her previous surgery.  Tolerated this.  Just takes at night to help her sleep.  She is out now.  Will refill short term, just to help with pain to help her sleep.  Use tylenol as directed twice a day.  Refer to ortho for evaluation and further treatment.        Relevant Orders   Ambulatory referral to Orthopedic Surgery       Einar Pheasant, MD

## 2015-04-16 NOTE — Progress Notes (Signed)
Pre-visit discussion using our clinic review tool. No additional management support is needed unless otherwise documented below in the visit note.  

## 2015-04-18 ENCOUNTER — Emergency Department: Payer: Medicare Other

## 2015-04-18 ENCOUNTER — Encounter: Payer: Self-pay | Admitting: Emergency Medicine

## 2015-04-18 ENCOUNTER — Emergency Department
Admission: EM | Admit: 2015-04-18 | Discharge: 2015-04-18 | Disposition: A | Discharge status: Home / Self Care | Payer: Medicare Other | Attending: Emergency Medicine | Admitting: Emergency Medicine

## 2015-04-18 DIAGNOSIS — E785 Hyperlipidemia, unspecified: Secondary | ICD-10-CM | POA: Diagnosis not present

## 2015-04-18 DIAGNOSIS — Z79899 Other long term (current) drug therapy: Secondary | ICD-10-CM | POA: Insufficient documentation

## 2015-04-18 DIAGNOSIS — M79621 Pain in right upper arm: Secondary | ICD-10-CM | POA: Diagnosis not present

## 2015-04-18 DIAGNOSIS — M7551 Bursitis of right shoulder: Secondary | ICD-10-CM | POA: Diagnosis not present

## 2015-04-18 DIAGNOSIS — E119 Type 2 diabetes mellitus without complications: Secondary | ICD-10-CM | POA: Insufficient documentation

## 2015-04-18 DIAGNOSIS — Z87891 Personal history of nicotine dependence: Secondary | ICD-10-CM | POA: Diagnosis not present

## 2015-04-18 DIAGNOSIS — M19011 Primary osteoarthritis, right shoulder: Secondary | ICD-10-CM | POA: Diagnosis not present

## 2015-04-18 DIAGNOSIS — M25511 Pain in right shoulder: Secondary | ICD-10-CM | POA: Diagnosis present

## 2015-04-18 MED ORDER — MELOXICAM 7.5 MG PO TABS: 7.5000 mg | ORAL_TABLET | Freq: Every day | ORAL | Status: DC

## 2015-04-18 NOTE — ED Provider Notes (Signed)
[facility] Emergency Department Provider Note  ____________________________________________  Time seen: Approximately 11:48 AM  I have reviewed the triage vital signs and the nursing notes.   HISTORY  Chief Complaint Arm Pain    HPI Jennifer Hodges is a 70 y.o. female patient complain of superior lateral humeral head pain secondary to injections given 3 weeks ago. Patient states she's taking Tylenol and recently started Percocets with no relief. Patient states she has decreased range of motion with abduction and overhead reaching. She is rating her pain as a 10 over 10. Patient denies any erythema. Patient points to the lateral humeral head as source of pain and also state this is the injection site. Patient denies any fever. Patient state her PCP twice for this complaint since the injection. Patient is also using heating pad with some mild relief.   Past Medical History  Diagnosis Date  . Hypercholesterolemia   . Bronchiectasis (HCC)     mild  . Asthma   . GERD (gastroesophageal reflux disease)     EGD 8/09- non bleeding erosive gastritis, documentd esophageal ulcerations.   . Diverticulosis   . Hiatal hernia   . Osteoarthritis     lumbar disc disease, left hip  . Chronic headaches      followed by [facility] migraines  . PONV (postoperative nausea and vomiting)     "Only with last hip and I believe it was due to the morphine"   . Family history of adverse reaction to anesthesia     PONV  . COPD (chronic obstructive pulmonary disease) ([place])   . History of pneumonia   . Diabetes mellitus without complication [facility])     "doctor says I no longer have diabetes"    . Sleep apnea     cpap asked to bring mask and tubing  . Weakness of right side of body   . Gall stones     history of    Patient Active Problem List   Diagnosis Date Noted  . Chest tightness 03/24/2015  . Stool incontinence 03/24/2015  . Urinary frequency 03/04/2015  . Cervical  spondylosis with radiculopathy 02/24/2015  . Pre-syncope 02/19/2015  . URI (upper respiratory infection) 02/15/2015  . Neck pain 12/25/2014  . Pelvic pain in female 11/16/2014  . Primary osteoarthritis of hip 08/05/2014  . Heel pain 06/01/2014  . Diarrhea 06/01/2014  . Health care maintenance 06/01/2014  . Right leg pain 03/30/2014  . Right arm pain 03/30/2014  . Sinusitis 03/30/2014  . Internal nasal lesion 11/12/2013  . Rib pain 08/04/2013  . Elevated AFP 04/28/2013  . Osteoporosis 11/12/2012  . Nonspecific (abnormal) findings on radiological and other examination of gastrointestinal tract 05/31/2012  . Bronchiectasis ([place]) 02/05/2012  . Sleep apnea 02/05/2012  . Degenerative disc disease, cervical 02/05/2012  . Hypercholesterolemia 02/05/2012  . GERD (gastroesophageal reflux disease) 02/05/2012  . Chronic headaches 02/05/2012    Past Surgical History  Procedure Laterality Date  . Appendectomy    . Cholecystectomy    . Abdominal hysterectomy  age 40  . Right oophorectomy    . Excisional hemorrhoidectomy    . Lumbar laminectomy    . Eus N/A 05/31/2012    Procedure: UPPER ENDOSCOPIC ULTRASOUND (EUS) LINEAR;  Surgeon: [name], MD;  Location: [facility] ENDOSCOPY;  Service: Endoscopy;  Laterality: N/A;  . Total hip arthroplasty Left 05/01/2014    Dr. [name]  . Breast surgery Bilateral     cyst removed and reduction  .  Back surgery      4th lumbar fusion  . Bladder surgery N/A     with vaginal wall repair  . Cataract extraction w/ intraocular lens implant Bilateral 2015  . Total hip arthroplasty Right 08/05/2014    Procedure: TOTAL HIP ARTHROPLASTY ANTERIOR APPROACH;  Surgeon: [name], MD;  Location: [facility] ORS;  Service: Orthopedics;  Laterality: Right;  . Reduction mammaplasty  1990  . Cardiac catheterization  2014  . Anterior cervical decomp/discectomy fusion N/A 02/24/2015    Procedure: CERVICAL FOUR-FIVE, CERVICAL FIVE-SIX, CERVICAL SIX-SEVEN ANTERIOR  CERVICAL DECOMPRESSION/DISCECTOMY FUSION ;  Surgeon: [name], MD;  Location: [place] [facility]UR[facility] ORS;  Service: Neurosurgery;  Laterality: N/A;  C45 C56 C67 anterior cervical decompression with fusion interbody prosthesis plating and bonegraft    Current Outpatient Rx  Name  Route  Sig  Dispense  Refill  . acetaminophen (TYLENOL) 500 MG tablet   Oral   Take 500 mg by mouth every 8 (eight) hours as needed for mild pain or moderate pain. Reported on 04/06/2015         . Cholecalciferol (VITAMIN D PO)   Oral   Take 800 Units by mouth.         . Coenzyme Q10 (CO Q 10) 100 MG CAPS   Oral   Take 100 mg by mouth every morning.          . Garlic 6433 MG CAPS   Oral   Take 2 capsules by mouth every morning.          Marland Kitchen guaiFENesin (MUCINEX) 600 MG 12 hr tablet   Oral   Take 600 mg by mouth daily. Reported on 04/06/2015         . meloxicam (MOBIC) 7.5 MG tablet   Oral   Take 1 tablet (7.5 mg total) by mouth daily.   10 tablet   0   . ondansetron (ZOFRAN) 4 MG tablet   Oral   Take 1 tablet (4 mg total) by mouth every 8 (eight) hours as needed for nausea or vomiting.   20 tablet   0   . oxyCODONE-acetaminophen (ROXICET) 5-325 MG tablet   Oral   Take 1 tablet by mouth at bedtime as needed for severe pain.   15 tablet   0   . pantoprazole (PROTONIX) 40 MG tablet   Oral   Take 1 tablet (40 mg total) by mouth 2 (two) times daily before a meal.   60 tablet   1     Allergies Aspirin; Morphine and related; Adhesive; Clarithromycin; Codeine; Darvon; Demerol; and Talwin  Family History  Problem Relation Age of Onset  . Heart disease Mother     s/p stent  . Hypertension Mother   . Hypercholesterolemia Mother   . Diabetes Father   . Stomach cancer      uncle  . Breast cancer Maternal Aunt     Social History Social History  Substance Use Topics  . Smoking status: Former Smoker    Quit date: 06/13/2007  . Smokeless tobacco: Never Used  . Alcohol Use: No     Review of Systems Constitutional: No fever/chills Eyes: No visual changes. ENT: No sore throat. Cardiovascular: Denies chest pain. Respiratory: Denies shortness of breath. Gastrointestinal: No abdominal pain.  No nausea, no vomiting.  No diarrhea.  No constipation. Genitourinary: Negative for dysuria. Musculoskeletal: Right arm pain Skin: Negative for rash. Neurological: Negative for headaches, focal weakness or numbness. Endocrine:Hyperlipidemia Allergic/Immunilogical: The medication list 10-point ROS otherwise negative.  ____________________________________________   PHYSICAL EXAM:  VITAL SIGNS: ED Triage Vitals  Enc Vitals Group     BP 04/18/15 1133 125/3 mmHg     Pulse Rate 04/18/15 1133 88     Resp 04/18/15 1133 18     Temp 04/18/15 1133 98 F (36.7 C)     Temp Source 04/18/15 1133 Oral     SpO2 04/18/15 1133 97 %     Weight 04/18/15 1133 176 lb (79.833 kg)     Height 04/18/15 1133 '5\' 2"'$  (1.575 m)     Head Cir --      Peak Flow --      Pain Score 04/18/15 1135 10     Pain Loc --      Pain Edu? --      Excl. in [place]? --     Constitutional: Alert and oriented. Well appearing and in no acute distress. Eyes: Conjunctivae are normal. PERRL. EOMI. Head: Atraumatic. Nose: No congestion/rhinnorhea. Mouth/Throat: Mucous membranes are moist.  Oropharynx non-erythematous. Neck: No stridor.  No cervical spine tenderness to palpation. Hematological/Lymphatic/Immunilogical: No cervical lymphadenopathy. Cardiovascular: Normal rate, regular rhythm. Grossly normal heart sounds.  Good peripheral circulation. Respiratory: Normal respiratory effort.  No retractions. Lungs CTAB. Gastrointestinal: Soft and nontender. No distention. No abdominal bruits. No CVA tenderness. Musculoskeletal: No obvious deformity of the right upper extremity. No erythema or edema. Patient has some moderate guarding palpation of the lateral humeral head.  No joint effusions. Neurologic:  Normal  speech and language. No gross focal neurologic deficits are appreciated. No gait instability. Skin:  Skin is warm, dry and intact. No rash noted. Psychiatric: Mood and affect are normal. Speech and behavior are normal.  ____________________________________________   LABS (all labs ordered are listed, but only abnormal results are displayed)  Labs Reviewed - No data to display ____________________________________________  EKG   ____________________________________________  RADIOLOGY  X-rays show fusion of the before meals joint. I, Sable Feil, personally viewed and evaluated these images (plain radiographs) as part of my medical decision making, as well as reviewing the written report by the radiologist.  ____________________________________________   PROCEDURES  Procedure(s) performed: None  Critical Care performed: No  ____________________________________________   INITIAL IMPRESSION / ASSESSMENT AND PLAN / ED COURSE  Pertinent labs & imaging results that were available during my care of the patient were reviewed by me and considered in my medical decision making (see chart for details).  Right shoulder bursitis and arthritis. Patient placed in this arm sling for comfort. Patient given prescription for Mobic and advised follow-up with scheduled appointment with Dr. Mack Guise orthopedic surgeon. ____________________________________________   FINAL CLINICAL IMPRESSION(S) / ED DIAGNOSES  Final diagnoses:  Bursitis of right shoulder  Primary osteoarthritis of right shoulder      Sable Feil, PA-C 04/18/15 1245  Lisa Roca, MD 04/18/15 [phone]

## 2015-04-18 NOTE — ED Notes (Signed)
Placed on pain meds, and not working

## 2015-04-18 NOTE — Discharge Instructions (Signed)
Bursitis Bursitis is inflammation and irritation of a bursa, which is one of the small, fluid-filled sacs that cushion and protect the moving parts of your body. These sacs are located between bones and muscles, muscle attachments, or skin areas next to bones. A bursa protects these structures from the wear and tear that results from frequent movement. An inflamed bursa causes pain and swelling. Fluid may build up inside the sac. Bursitis is most common near joints, especially the knees, elbows, hips, and shoulders. CAUSES Bursitis can be caused by:   Injury from:  A direct blow, like falling on your knee or elbow.  Overuse of a joint (repetitive stress).  Infection. This can happen if bacteria gets into a bursa through a cut or scrape near a joint.  Diseases that cause joint inflammation, such as gout and rheumatoid arthritis. RISK FACTORS You may be at risk for bursitis if you:   Have a job or hobby that involves a lot of repetitive stress on your joints.  Have a condition that weakens your body's defense system (immune system), such as diabetes, cancer, or HIV.  Lift and reach overhead often.  Kneel or lean on hard surfaces often.  Run or walk often. SIGNS AND SYMPTOMS The most common signs and symptoms of bursitis are:  Pain that gets worse when you move the affected body part or put weight on it.  Inflammation.  Stiffness. Other signs and symptoms may include:  Redness.  Tenderness.  Warmth.  Pain that continues after rest.  Fever and chills. This may occur in bursitis caused by infection. DIAGNOSIS Bursitis may be diagnosed by:   Medical history and physical exam.  MRI.  A procedure to drain fluid from the bursa with a needle (aspiration). The fluid may be checked for signs of infection or gout.  Blood tests to rule out other causes of inflammation. TREATMENT  Bursitis can usually be treated at home with rest, ice, compression, and elevation (RICE). For  mild bursitis, RICE treatment may be all you need. Other treatments may include:  Nonsteroidal anti-inflammatory drugs (NSAIDs) to treat pain and inflammation.  Corticosteroids to fight inflammation. You may have these drugs injected into and around the area of bursitis.  Aspiration of bursitis fluid to relieve pain and improve movement.  Antibiotic medicine to treat an infected bursa.  A splint, brace, or walking aid.  Physical therapy if you continue to have pain or limited movement.  Surgery to remove a damaged or infected bursa. This may be needed if you have a very bad case of bursitis or if other treatments have not worked. HOME CARE INSTRUCTIONS   Take medicines only as directed by your health care provider.  If you were prescribed an antibiotic medicine, finish it all even if you start to feel better.  Rest the affected area as directed by your health care provider.  Keep the area elevated.  Avoid activities that make pain worse.  Apply ice to the injured area:  Place ice in a plastic bag.  Place a towel between your skin and the bag.  Leave the ice on for 20 minutes, 2-3 times a day.  Use splints, braces, pads, or walking aids as directed by your health care provider.  Keep all follow-up visits as directed by your health care provider. This is important. PREVENTION   Wear knee pads if you kneel often.  Wear sturdy running or walking shoes that fit you well.  Take regular breaks from repetitive activity.  Warm  up by stretching before doing any strenuous activity.  Maintain a healthy weight or lose weight as recommended by your health care provider. Ask your health care provider if you need help.  Exercise regularly. Start any new physical activity gradually. SEEK MEDICAL CARE IF:   Your bursitis is not responding to treatment or home care.  You have a fever.  You have chills.   This information is not intended to replace advice given to you by your  health care provider. Make sure you discuss any questions you have with your health care provider.   Document Released: 02/26/2000 Document Revised: 11/19/2014 Document Reviewed: 05/20/2013 Elsevier Interactive Patient Education 2016 Elsevier Inc.  Osteoarthritis Osteoarthritis is a disease that causes soreness and inflammation of a joint. It occurs when the cartilage at the affected joint wears down. Cartilage acts as a cushion, covering the ends of bones where they meet to form a joint. Osteoarthritis is the most common form of arthritis. It often occurs in older people. The joints affected most often by this condition include those in the:  Ends of the fingers.  Thumbs.  Neck.  Lower back.  Knees.  Hips. CAUSES  Over time, the cartilage that covers the ends of bones begins to wear away. This causes bone to rub on bone, producing pain and stiffness in the affected joints.  RISK FACTORS Certain factors can increase your chances of having osteoarthritis, including:  Older age.  Excessive body weight.  Overuse of joints.  Previous joint injury. SIGNS AND SYMPTOMS   Pain, swelling, and stiffness in the joint.  Over time, the joint may lose its normal shape.  Small deposits of bone (osteophytes) may grow on the edges of the joint.  Bits of bone or cartilage can break off and float inside the joint space. This may cause more pain and damage. DIAGNOSIS  Your health care provider will do a physical exam and ask about your symptoms. Various tests may be ordered, such as:  X-rays of the affected joint.  Blood tests to rule out other types of arthritis. Additional tests may be used to diagnose your condition. TREATMENT  Goals of treatment are to control pain and improve joint function. Treatment plans may include:  A prescribed exercise program that allows for rest and joint relief.  A weight control plan.  Pain relief techniques, such as:  Properly applied heat and  cold.  Electric pulses delivered to nerve endings under the skin (transcutaneous electrical nerve stimulation [TENS]).  Massage.  Certain nutritional supplements.  Medicines to control pain, such as:  Acetaminophen.  Nonsteroidal anti-inflammatory drugs (NSAIDs), such as naproxen.  Narcotic or central-acting agents, such as tramadol.  Corticosteroids. These can be given orally or as an injection.  Surgery to reposition the bones and relieve pain (osteotomy) or to remove loose pieces of bone and cartilage. Joint replacement may be needed in advanced states of osteoarthritis. HOME CARE INSTRUCTIONS   Take medicines only as directed by your health care provider.  Maintain a healthy weight. Follow your health care provider's instructions for weight control. This may include dietary instructions.  Exercise as directed. Your health care provider can recommend specific types of exercise. These may include:  Strengthening exercises. These are done to strengthen the muscles that support joints affected by arthritis. They can be performed with weights or with exercise bands to add resistance.  Aerobic activities. These are exercises, such as brisk walking or low-impact aerobics, that get your heart pumping.  Range-of-motion activities. These  keep your joints limber.  Balance and agility exercises. These help you maintain daily living skills.  Rest your affected joints as directed by your health care provider.  Keep all follow-up visits as directed by your health care provider. SEEK MEDICAL CARE IF:   Your skin turns red.  You develop a rash in addition to your joint pain.  You have worsening joint pain.  You have a fever along with joint or muscle aches. SEEK IMMEDIATE MEDICAL CARE IF:  You have a significant loss of weight or appetite.  You have night sweats. [place] of Arthritis and Musculoskeletal and Skin Diseases:  www.niams.SouthExposed.es  [employer] on Aging: http://kim-miller.com/  American College of Rheumatology: www.rheumatology.org   This information is not intended to replace advice given to you by your health care provider. Make sure you discuss any questions you have with your health care provider.   Document Released: 02/28/2005 Document Revised: 03/21/2014 Document Reviewed: 11/05/2012 Elsevier Interactive Patient Education [employer].

## 2015-04-19 ENCOUNTER — Encounter: Payer: Self-pay | Admitting: Internal Medicine

## 2015-04-19 NOTE — Assessment & Plan Note (Signed)
Right arm and shoulder pain - persistent and worsening.  Unable to lift arm.  Unable to take antiinflammatories.  Took some oxycodone left over from her previous surgery.  Tolerated this.  Just takes at night to help her sleep.  She is out now.  Will refill short term, just to help with pain to help her sleep.  Use tylenol as directed twice a day.  Refer to ortho for evaluation and further treatment.

## 2015-04-22 DIAGNOSIS — M5412 Radiculopathy, cervical region: Secondary | ICD-10-CM | POA: Diagnosis not present

## 2015-04-22 DIAGNOSIS — M7541 Impingement syndrome of right shoulder: Secondary | ICD-10-CM | POA: Diagnosis not present

## 2015-04-23 ENCOUNTER — Ambulatory Visit: Payer: Medicare Other | Attending: Orthopedic Surgery | Admitting: Physical Therapy

## 2015-04-23 ENCOUNTER — Encounter: Payer: Self-pay | Admitting: Physical Therapy

## 2015-04-23 DIAGNOSIS — M25511 Pain in right shoulder: Secondary | ICD-10-CM | POA: Diagnosis not present

## 2015-04-23 DIAGNOSIS — M25619 Stiffness of unspecified shoulder, not elsewhere classified: Secondary | ICD-10-CM

## 2015-04-23 DIAGNOSIS — M7580 Other shoulder lesions, unspecified shoulder: Secondary | ICD-10-CM | POA: Insufficient documentation

## 2015-04-23 DIAGNOSIS — M6281 Muscle weakness (generalized): Secondary | ICD-10-CM

## 2015-04-23 DIAGNOSIS — M25611 Stiffness of right shoulder, not elsewhere classified: Secondary | ICD-10-CM | POA: Insufficient documentation

## 2015-04-23 NOTE — Therapy (Signed)
[facility] [address] [place], [place], [address] Phone: [phone]   Fax:  [phone]  Physical Therapy Evaluation  Patient Details  Name: Jennifer Hodges MRN: [id number] Date of Birth: 04-Feb-1946 Referring Provider: Dr. Mack Guise  Encounter Date: 04/23/2015      PT End of Session - 04/23/15 1207    Visit Number 1   Number of Visits 8   Authorization - Visit Number 1   Authorization - Number of Visits 10   PT Start Time 9735   PT Stop Time 0945   PT Time Calculation (min) 64 min   Activity Tolerance Patient tolerated treatment well;Patient limited by pain   Behavior During Therapy [facility] for tasks assessed/performed      Past Medical History  Diagnosis Date  . Hypercholesterolemia   . Bronchiectasis (HCC)     mild  . Asthma   . GERD (gastroesophageal reflux disease)     EGD 8/09- non bleeding erosive gastritis, documentd esophageal ulcerations.   . Diverticulosis   . Hiatal hernia   . Osteoarthritis     lumbar disc disease, left hip  . Chronic headaches      followed by [facility] migraines  . PONV (postoperative nausea and vomiting)     "Only with last hip and I believe it was due to the morphine"   . Family history of adverse reaction to anesthesia     PONV  . COPD (chronic obstructive pulmonary disease) ([place])   . History of pneumonia   . Diabetes mellitus without complication [facility])     "doctor says I no longer have diabetes"    . Sleep apnea     cpap asked to bring mask and tubing  . Weakness of right side of body   . Gall stones     history of    Past Surgical History  Procedure Laterality Date  . Appendectomy    . Cholecystectomy    . Abdominal hysterectomy  age 25  . Right oophorectomy    . Excisional hemorrhoidectomy    . Lumbar laminectomy    . Eus N/A 05/31/2012    Procedure: UPPER ENDOSCOPIC ULTRASOUND (EUS) LINEAR;  Surgeon: [name], MD;  Location: [facility];  Service: Endoscopy;   Laterality: N/A;  . Total hip arthroplasty Left 05/01/2014    Dr. [name]  . Breast surgery Bilateral     cyst removed and reduction  . Back surgery      4th lumbar fusion  . Bladder surgery N/A     with vaginal wall repair  . Cataract extraction w/ intraocular lens implant Bilateral 2015  . Total hip arthroplasty Right 08/05/2014    Procedure: TOTAL HIP ARTHROPLASTY ANTERIOR APPROACH;  Surgeon: [name], MD;  Location: [facility] ORS;  Service: Orthopedics;  Laterality: Right;  . Reduction mammaplasty  1990  . Cardiac catheterization  2014  . Anterior cervical decomp/discectomy fusion N/A 02/24/2015    Procedure: CERVICAL FOUR-FIVE, CERVICAL FIVE-SIX, CERVICAL SIX-SEVEN ANTERIOR CERVICAL DECOMPRESSION/DISCECTOMY FUSION ;  Surgeon: [name], MD;  Location: [place] [facility];  Service: Neurosurgery;  Laterality: N/A;  C45 C56 C67 anterior cervical decompression with fusion interbody prosthesis plating and bonegraft    There were no vitals filed for this visit.  Visit Diagnosis:  Pain in joint of right shoulder  Decreased range of motion (ROM) of shoulder  Muscle weakness  Shoulder joint stiffness, right      Subjective Assessment - 04/23/15 1148  Subjective Pt presents to PT with R shoulder pain currently at 5/10. Pt describes pain as a sore feelnig with pain radiating down to biceps region with flexion/abd. Pt states she just started driving today and has started sleeping better with new meds..   Pertinent History Pt has had B THA   Limitations House hold activities   Diagnostic tests XRAY   Patient Stated Goals decrease pain and be able to complete household activities.   Currently in Pain? Yes   Pain Score 5    Pain Location Shoulder   Pain Orientation Right   Pain Descriptors / Indicators Sore;Radiating   Pain Type Acute pain   Pain Radiating Towards Biceps region   Pain Onset 1 to 4 weeks ago   Pain Frequency Constant          Objectives: Grade I  AP/Inf Mobilizations to R shoulder 2 x 30 sec. Distraction of R shoulder 4 x 20 sec.    Shoudler MMT L flex: 4/5. R flex: 3/5 with pain under arm. L abd: 4/5 with L neck pain. R abd: 3/5.  Shoulder AROM:  L flex: 150 deg. R flex: 114 deg. L abd: 157 deg. R abd: 94 deg.  PROM shoulder (Pt was guarded with PROM):  R flex: 110 deg. R abd: 70 deg.  Pt had positive Neer's and Hawkin's Kennedy.      PT Education - 04/23/15 1205    Education provided Yes   Education Details see above handout. Pt was also given pulleys for AAROM flexion and abduction.   Person(s) Educated Patient   Methods Explanation;Demonstration;Handout   Comprehension Verbalized understanding;Returned demonstration             PT Long Term Goals - 04/23/15 1229    PT LONG TERM GOAL #1   Title Pt will decrease her QuickDash score to <50% in order to decrease pain to sleep through the night.   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   Baseline AROM 114 deg   Time 4   Period Weeks   Status New   PT LONG TERM GOAL #3   Title Pt will increase R shoulder flex to 4/5 in order to perform household tasks which require repetitive motions like sweeping.    Baseline R shoulder flex 3/5   Time 4   Period Weeks   Status New   PT LONG TERM GOAL #4   Title Pt. able to sleep on R shoulder with no c/o pain to improve rest/ pain-free mobility.    Time 4   Period Weeks   Status New            Plan - 04/23/15 1208    Clinical Impression Statement Pt is a pleasant 70 y/o female who presents to PT with R shoulder pain. Pt explained that on 1/23 she went to get a pneumonia shot and she has been experiencing that same pain since then. Pt states MD told her she has bursitis in R shoulder which is what is causing impingment symptoms. Pt states she was in severe pain on Sunday, 2/4,  and went to the emergency room where she was given a sling (she did not bring to session). Pt currently describes pain as sore and radiating down biceps regions with movement. Pt  reports she was not able to sleep last week but sleeping has improved with meloxicam. Pt is able to sleep on L side/back but avoids sleepnig on R side due to increase in pain. Pt states she is still able to wash dishes and take a bath with increased use of L UE. Pt reports trouble with dusting and sweeping floor. Shoudler MMT: L flex: 4/5. R flex: 3/5 with pain under arm. L abd: 4/5 with L neck pain. R abd: 3/5. Shoulder AROM: L flex: 150 deg. R flex: 114 deg. L abd: 157 deg. R abd: 94 deg. PROM shoulder (Pt was guarded with PROM): R flex: 110 deg. R abd: 70 deg. Pt had positive Neer's and Hawkin's Kennedy. Pt will benefit from skilled PT to increase ROM and strength in order to complete household tasks and return to prior level of function.   Pt will benefit from skilled therapeutic intervention in order to improve on the following deficits Decreased endurance;Decreased mobility;Decreased strength;Decreased range of motion;Hypomobility;Impaired flexibility;Impaired UE functional use;Improper body mechanics;Postural dysfunction;Pain   Rehab Potential Good   PT Frequency 2x / week   PT Duration 4 weeks   PT Treatment/Interventions ADLs/Self Care Home Management;Cryotherapy;Electrical Stimulation;Moist Heat;Ultrasound;Functional mobility training;Therapeutic activities;Therapeutic exercise;Patient/family education;Manual techniques;Passive range of motion;Dry needling;Iontophoresis '4mg'$ /ml Dexamethasone   PT Next Visit Plan increase ROM/strengthening.   PT Home Exercise Plan see above handout   Consulted and Agree with Plan of Care Patient          G-Codes - 05/02/15 1320    Functional Assessment Tool Used QuickDASH/ clinical impression/ pain/ ROM   Functional Limitation Carrying, moving and handling objects   Carrying, Moving  and Handling Objects Current Status (X5284) At least 40 percent but less than 60 percent impaired, limited or restricted   Carrying, Moving and Handling Objects Goal Status (X3244) At least 1 percent but less than 20 percent impaired, limited or restricted       Problem List Patient Active Problem List   Diagnosis Date Noted  . Chest tightness 03/24/2015  . Stool incontinence 03/24/2015  . Urinary frequency 03/04/2015  . Cervical spondylosis with radiculopathy 02/24/2015  . Pre-syncope 02/19/2015  . URI (upper respiratory infection) 02/15/2015  . Neck pain 12/25/2014  . Pelvic pain in female 11/16/2014  . Primary osteoarthritis of hip 08/05/2014  . Heel pain 06/01/2014  . Diarrhea 06/01/2014  . Health care maintenance 06/01/2014  . Right leg pain 03/30/2014  . Right arm pain 03/30/2014  . Sinusitis 03/30/2014  . Internal nasal lesion 11/12/2013  . Rib pain 08/04/2013  . Elevated AFP 04/28/2013  . Osteoporosis 11/12/2012  . Nonspecific (abnormal) findings on radiological and other examination of gastrointestinal tract 05/31/2012  . Bronchiectasis ([place]) 02/05/2012  . Sleep apnea 02/05/2012  . Degenerative disc disease, cervical 02/05/2012  . Hypercholesterolemia 02/05/2012  . GERD (gastroesophageal reflux disease) 02/05/2012  . Chronic headaches 02/05/2012   Pura Spice, PT, DPT # [id number]   04/24/2015, 1:21 PM  [place] [facility] [address] [place], [place], [address] Phone: [phone]   Fax:  [phone]  Name: Jennifer Hodges MRN: [id number] Date of Birth: 1945-10-31

## 2015-04-27 ENCOUNTER — Telehealth: Payer: Self-pay | Admitting: Internal Medicine

## 2015-04-27 DIAGNOSIS — M25551 Pain in right hip: Secondary | ICD-10-CM

## 2015-04-27 NOTE — Telephone Encounter (Signed)
Please advise, she already has a referral in place due to right arm, would this be for the same doctor? Or needs separate referral?

## 2015-04-27 NOTE — Telephone Encounter (Signed)
Order placed for referral.  Someone will be contacting her with an appt date and time.

## 2015-04-27 NOTE — Telephone Encounter (Signed)
My understanding is that this would be a separate appt.  Need to know if she has seen ortho already for her arm.  Per ER note was to see Dr Mack Guise.  Per note, wants to see Dr Marry Guan for her hip.  Need more info.  Is she able to walk, increased pain, etc.  I can refer to Dr Clydell Hakim office.  Is she ok to see Dr  Clydell Hakim PA.  Will be able to get a faster appt with PA.

## 2015-04-27 NOTE — Telephone Encounter (Signed)
Pt called about needing a referral to Dr Marry Guan. Pt almost fell last night right knee and hip was going another way it was like it was out of socket. Call pt @ [phone]. Pt has an appt on 05/04/15 with Dr Nicki Reaper. Thank you!

## 2015-04-27 NOTE — Telephone Encounter (Signed)
Spoke with Mrs. Jennifer Hodges, She has seen Dr. Delton Coombes her right arm, they have set her up with Physical therapy and she has gone once already and will go again tomorrow.   She needs the referral for Dr. Marry Guan as Dr. Rudene Christians, when he did her hip replacement in the past said that her leg may have issues in the future where the lower bone will go one way and then the upper bone goes the other direction and it will come out of socket.  That is what happened last night per the patient.  She is able to walk, just hurt when it happened.  She is willing to see Dr. Clydell Hakim PA.   Please advise.

## 2015-04-28 ENCOUNTER — Ambulatory Visit: Payer: Medicare Other | Admitting: Physical Therapy

## 2015-04-28 DIAGNOSIS — M25511 Pain in right shoulder: Secondary | ICD-10-CM | POA: Diagnosis not present

## 2015-04-28 DIAGNOSIS — M6281 Muscle weakness (generalized): Secondary | ICD-10-CM | POA: Diagnosis not present

## 2015-04-28 DIAGNOSIS — M7580 Other shoulder lesions, unspecified shoulder: Secondary | ICD-10-CM | POA: Diagnosis not present

## 2015-04-28 DIAGNOSIS — M25611 Stiffness of right shoulder, not elsewhere classified: Secondary | ICD-10-CM | POA: Diagnosis not present

## 2015-04-28 DIAGNOSIS — M25619 Stiffness of unspecified shoulder, not elsewhere classified: Secondary | ICD-10-CM

## 2015-04-29 ENCOUNTER — Encounter: Payer: Self-pay | Admitting: Physical Therapy

## 2015-04-29 NOTE — Therapy (Signed)
[place] [facility] [address] [place], [place], [address] Phone: [phone]   Fax:  [phone]  Physical Therapy Treatment  Patient Details  Name: Jennifer Hodges MRN: [id number] Date of Birth: Jul 29, 1945 Referring Provider: Dr. Mack Guise  Encounter Date: 04/28/2015      PT End of Session - 04/29/15 0909    Visit Number 2   Number of Visits 8   Authorization - Visit Number 2   Authorization - Number of Visits 10   PT Start Time 7106   PT Stop Time 1541   PT Time Calculation (min) 70 min   Activity Tolerance Patient tolerated treatment well   Behavior During Therapy [facility] for tasks assessed/performed      Past Medical History  Diagnosis Date  . Hypercholesterolemia   . Bronchiectasis (HCC)     mild  . Asthma   . GERD (gastroesophageal reflux disease)     EGD 8/09- non bleeding erosive gastritis, documentd esophageal ulcerations.   . Diverticulosis   . Hiatal hernia   . Osteoarthritis     lumbar disc disease, left hip  . Chronic headaches      followed by [facility] migraines  . PONV (postoperative nausea and vomiting)     "Only with last hip and I believe it was due to the morphine"   . Family history of adverse reaction to anesthesia     PONV  . COPD (chronic obstructive pulmonary disease) ([place])   . History of pneumonia   . Diabetes mellitus without complication [facility])     "doctor says I no longer have diabetes"    . Sleep apnea     cpap asked to bring mask and tubing  . Weakness of right side of body   . Gall stones     history of    Past Surgical History  Procedure Laterality Date  . Appendectomy    . Cholecystectomy    . Abdominal hysterectomy  age 25  . Right oophorectomy    . Excisional hemorrhoidectomy    . Lumbar laminectomy    . Eus N/A 05/31/2012    Procedure: UPPER ENDOSCOPIC ULTRASOUND (EUS) LINEAR;  Surgeon: [name], MD;  Location: [facility];  Service: Endoscopy;  Laterality: N/A;  .  Total hip arthroplasty Left 05/01/2014    Dr. [name]  . Breast surgery Bilateral     cyst removed and reduction  . Back surgery      4th lumbar fusion  . Bladder surgery N/A     with vaginal wall repair  . Cataract extraction w/ intraocular lens implant Bilateral 2015  . Total hip arthroplasty Right 08/05/2014    Procedure: TOTAL HIP ARTHROPLASTY ANTERIOR APPROACH;  Surgeon: [name], MD;  Location: [facility] ORS;  Service: Orthopedics;  Laterality: Right;  . Reduction mammaplasty  1990  . Cardiac catheterization  2014  . Anterior cervical decomp/discectomy fusion N/A 02/24/2015    Procedure: CERVICAL FOUR-FIVE, CERVICAL FIVE-SIX, CERVICAL SIX-SEVEN ANTERIOR CERVICAL DECOMPRESSION/DISCECTOMY FUSION ;  Surgeon: [name], MD;  Location: [place] [facility];  Service: Neurosurgery;  Laterality: N/A;  C45 C56 C67 anterior cervical decompression with fusion interbody prosthesis plating and bonegraft    There were no vitals filed for this visit.  Visit Diagnosis:  Pain in joint of right shoulder  Decreased range of motion (ROM) of shoulder  Muscle weakness  Shoulder joint stiffness, right      Subjective Assessment - 04/29/15 0905    Subjective Pt  reports 2/10 R shoulder pain but states she is feeling a lot better today. Pt also reports 7/10 knee pain.   Pertinent History Pt has had B THA   Limitations House hold activities   Diagnostic tests XRAY   Patient Stated Goals decrease pain and be able to complete household activities.   Currently in Pain? Yes   Pain Score 2    Pain Location Shoulder   Pain Orientation Right   Pain Type Acute pain   Pain Radiating Towards Biceps   Pain Onset 1 to 4 weeks ago   Pain Frequency Constant   Multiple Pain Sites Yes   Pain Score 7   Pain Location Knee   Pain Orientation Right;Left   Pain Frequency Intermittent      Objectives: UBE 2 mins forward/2 mins backwards (warm-up/no charge). Manual: STM to R bicep/UT(tender and tightness  noted here). AP/PA/Inf mobs (grade II/III) x 2 for 20 seconds each, pt was able to tolerate mobs better today. Distraction x 4 for 30 seconds. PROM R shoulder flexion and abduction x 4 for 30 seconds. Ther ex: Shoulder flexion and abduction with 1# 2 x 10 with verbal and tactile cueing to relax shoulders and keep elbows straight. Bicep curls with 2# 2 x 10. Nautilus: tricep extension/scapular retraction with 10# 2 x 10 with verbal and tactile cueing for good form.  Pt response to treatment for medical necessity: Pt benefits from skilled PT to increase strength in order to improve functional mobility and complete daily tasks with ease. Pt benefits from manual techniques to increase pain free R shoulder ROM and return to prior level of function.        PT Education - 04/29/15 0908    Education provided Yes   Education Details Pt was re-educated on how to perform scapular retraction, sidelying ER, and pendulum.   Person(s) Educated Patient   Methods Explanation;Demonstration;Handout   Comprehension Verbalized understanding;Returned demonstration             PT Long Term Goals - 04/23/15 1229    PT LONG TERM GOAL #1   Title Pt will decrease her QuickDash score to <50% in order to decrease pain to sleep through the night.   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   Baseline AROM 114 deg   Time 4   Period Weeks   Status New   PT LONG TERM GOAL #3   Title Pt will increase R shoulder flex to 4/5 in order to perform household tasks which require repetitive motions like sweeping.    Baseline R shoulder flex 3/5   Time 4   Period Weeks   Status New   PT LONG TERM GOAL #4   Title Pt. able to sleep on R shoulder with no c/o pain to improve rest/ pain-free mobility.    Time 4   Period Weeks   Status New            Plan - 04/29/15 0909     Clinical Impression Statement Pt shows progress towards goal of decreasing R shoulder pain. Pt was having trouble with certain exercises on HEP and was re-educated on scapular retraction, sidelying ER, and pendulum. Tenderness noted at R biceps region with STM. Pt reports severe knee pain with pendulum and was instructed  on keeps the knee behind the toes and to sway body back and forth instead of bending at the knees. Pt demonstrated good posture with shoulder flex and abd with cueing to keep shoulder/UT relaxed.    Pt will benefit from skilled therapeutic intervention in order to improve on the following deficits Decreased endurance;Decreased mobility;Decreased strength;Decreased range of motion;Hypomobility;Impaired flexibility;Impaired UE functional use;Improper body mechanics;Postural dysfunction;Pain   Rehab Potential Good   PT Frequency 2x / week   PT Duration 4 weeks   PT Treatment/Interventions ADLs/Self Care Home Management;Cryotherapy;Electrical Stimulation;Moist Heat;Ultrasound;Functional mobility training;Therapeutic activities;Therapeutic exercise;Patient/family education;Manual techniques;Passive range of motion;Dry needling;Iontophoresis '4mg'$ /ml Dexamethasone   PT Next Visit Plan increase ROM/strengthening   PT Home Exercise Plan see above handout   Consulted and Agree with Plan of Care Patient        Problem List Patient Active Problem List   Diagnosis Date Noted  . Chest tightness 03/24/2015  . Stool incontinence 03/24/2015  . Urinary frequency 03/04/2015  . Cervical spondylosis with radiculopathy 02/24/2015  . Pre-syncope 02/19/2015  . URI (upper respiratory infection) 02/15/2015  . Neck pain 12/25/2014  . Pelvic pain in female 11/16/2014  . Primary osteoarthritis of hip 08/05/2014  . Heel pain 06/01/2014  . Diarrhea 06/01/2014  . Health care maintenance 06/01/2014  . Right leg pain 03/30/2014  . Right arm pain 03/30/2014  . Sinusitis 03/30/2014  . Internal nasal  lesion 11/12/2013  . Rib pain 08/04/2013  . Elevated AFP 04/28/2013  . Osteoporosis 11/12/2012  . Nonspecific (abnormal) findings on radiological and other examination of gastrointestinal tract 05/31/2012  . Bronchiectasis ([place]) 02/05/2012  . Sleep apnea 02/05/2012  . Degenerative disc disease, cervical 02/05/2012  . Hypercholesterolemia 02/05/2012  . GERD (gastroesophageal reflux disease) 02/05/2012  . Chronic headaches 02/05/2012    Georgina Pillion, SPT 04/29/2015, 9:20 AM  [place] [facility] [address] [place], [place], [address] Phone: [phone]   Fax:  [phone]  Name: Jennifer Hodges MRN: [id number] Date of Birth: 1945/05/08

## 2015-04-30 ENCOUNTER — Ambulatory Visit: Payer: Medicare Other | Admitting: Physical Therapy

## 2015-04-30 ENCOUNTER — Encounter: Payer: Self-pay | Admitting: Physical Therapy

## 2015-04-30 DIAGNOSIS — M25511 Pain in right shoulder: Secondary | ICD-10-CM | POA: Diagnosis not present

## 2015-04-30 DIAGNOSIS — M6281 Muscle weakness (generalized): Secondary | ICD-10-CM

## 2015-04-30 DIAGNOSIS — M25619 Stiffness of unspecified shoulder, not elsewhere classified: Secondary | ICD-10-CM

## 2015-04-30 DIAGNOSIS — M7580 Other shoulder lesions, unspecified shoulder: Secondary | ICD-10-CM | POA: Diagnosis not present

## 2015-04-30 DIAGNOSIS — M25611 Stiffness of right shoulder, not elsewhere classified: Secondary | ICD-10-CM | POA: Diagnosis not present

## 2015-04-30 NOTE — Therapy (Signed)
[facility] [address] [place], [place], [address] Phone: [phone]   Fax:  [phone]  Physical Therapy Treatment  Patient Details  Name: Jennifer Hodges MRN: [id number] Date of Birth: 1945-12-18 Referring Provider: Dr. Mack Guise  Encounter Date: 04/30/2015      PT End of Session - 04/30/15 1511    Visit Number 3   Number of Visits 8   Authorization - Visit Number 3   Authorization - Number of Visits 10   PT Start Time 5638   Activity Tolerance Patient tolerated treatment well   Behavior During Therapy [facility] for tasks assessed/performed      Past Medical History  Diagnosis Date  . Hypercholesterolemia   . Bronchiectasis (HCC)     mild  . Asthma   . GERD (gastroesophageal reflux disease)     EGD 8/09- non bleeding erosive gastritis, documentd esophageal ulcerations.   . Diverticulosis   . Hiatal hernia   . Osteoarthritis     lumbar disc disease, left hip  . Chronic headaches      followed by [facility] migraines  . PONV (postoperative nausea and vomiting)     "Only with last hip and I believe it was due to the morphine"   . Family history of adverse reaction to anesthesia     PONV  . COPD (chronic obstructive pulmonary disease) ([place])   . History of pneumonia   . Diabetes mellitus without complication [facility])     "doctor says I no longer have diabetes"    . Sleep apnea     cpap asked to bring mask and tubing  . Weakness of right side of body   . Gall stones     history of    Past Surgical History  Procedure Laterality Date  . Appendectomy    . Cholecystectomy    . Abdominal hysterectomy  age 34  . Right oophorectomy    . Excisional hemorrhoidectomy    . Lumbar laminectomy    . Eus N/A 05/31/2012    Procedure: UPPER ENDOSCOPIC ULTRASOUND (EUS) LINEAR;  Surgeon: [name], MD;  Location: [facility];  Service: Endoscopy;  Laterality: N/A;  . Total hip arthroplasty Left 05/01/2014    Dr. [name]  . Breast surgery Bilateral     cyst removed and reduction  . Back surgery      4th lumbar fusion  . Bladder surgery N/A     with vaginal wall repair  . Cataract extraction w/ intraocular lens implant Bilateral 2015  . Total hip arthroplasty Right 08/05/2014    Procedure: TOTAL HIP ARTHROPLASTY ANTERIOR APPROACH;  Surgeon: [name], MD;  Location: [facility] ORS;  Service: Orthopedics;  Laterality: Right;  . Reduction mammaplasty  1990  . Cardiac catheterization  2014  . Anterior cervical decomp/discectomy fusion N/A 02/24/2015    Procedure: CERVICAL FOUR-FIVE, CERVICAL FIVE-SIX, CERVICAL SIX-SEVEN ANTERIOR CERVICAL DECOMPRESSION/DISCECTOMY FUSION ;  Surgeon: [name], MD;  Location: [place] [facility];  Service: Neurosurgery;  Laterality: N/A;  C45 C56 C67 anterior cervical decompression with fusion interbody prosthesis plating and bonegraft    There were no vitals filed for this visit.  Visit Diagnosis:  No diagnosis found.      Subjective Assessment - 04/30/15 1500    Subjective Pt reports 4/10 shoulder pain.    Pertinent History Pt has had B THA   Limitations House hold activities   Diagnostic tests XRAY   Patient Stated Goals decrease pain and  be able to complete household activities.   Pain Onset 1 to 4 weeks ago       Objective: UBE: 2 mins forward and 2 mins backwards. (no charge/warm-up) Manual: STM to R shoulder and UT. AP/PA/Inf mobilizations of R shoulder (grade II/III) x 2 for 30 seconds. Distraction x 4 for 30 seconds. Ther ex: Nautilus: Tricep extension #10-2 x 10/Shoulder extension #20- 2 x 8/Scapular retraction #10-2 x 10/Bicep curls #10-2 x 15. Overhead reaching on shelf with 8 cones x 2. ER wit yellow tb x 15 with verbal cueing to decrease wrist compensation and keep elbow at side.   Pt response to treatment for medical necessity: Pt will benefit from skilled PT to increase shoulder strength in order to complete daily tasks with ease such as putting dishes  away. 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       PT Education - 04/29/15 0908    Education provided Yes   Education Details Pt was re-educated on how to perform scapular retraction, sidelying ER, and pendulum.   Person(s) Educated Patient   Methods Explanation;Demonstration;Handout   Comprehension Verbalized understanding;Returned demonstration             PT Long Term Goals - 04/23/15 1229    PT LONG TERM GOAL #1   Title Pt will decrease her QuickDash score to <50% in order to decrease pain to sleep through the night.   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   Baseline AROM 114 deg   Time 4   Period Weeks   Status New   PT LONG TERM GOAL #3   Title Pt will increase R shoulder flex to 4/5 in order to perform household tasks which require repetitive motions like sweeping.    Baseline R shoulder flex 3/5   Time 4   Period Weeks   Status New   PT LONG TERM GOAL #4   Title Pt. able to sleep on R shoulder with no c/o pain to improve rest/ pain-free mobility.    Time 4   Period Weeks   Status New               Plan - 04/29/15 0909    Clinical Impression Statement Pt shows progress towards goal of decreasing R shoulder pain. Pt was having trouble with certain exercises on HEP and was re-educated on scapular retraction, sidelying ER, and pendulum. Tenderness noted at R biceps region with STM. Pt reports severe knee pain with pendulum and was instructed on keeps the knee behind the toes and to sway body back and forth instead of bending at the knees. Pt demonstrated good posture with shoulder flex and abd with cueing to keep shoulder/UT relaxed.    Pt will benefit from skilled therapeutic intervention in order to improve on the following deficits Decreased endurance;Decreased  mobility;Decreased strength;Decreased range of motion;Hypomobility;Impaired flexibility;Impaired UE functional use;Improper body mechanics;Postural dysfunction;Pain   Rehab Potential Good   PT Frequency 2x / week   PT Duration 4 weeks   PT Treatment/Interventions ADLs/Self Care Home Management;Cryotherapy;Electrical Stimulation;Moist Heat;Ultrasound;Functional mobility training;Therapeutic activities;Therapeutic exercise;Patient/family education;Manual techniques;Passive range of motion;Dry needling;Iontophoresis '4mg'$ /ml Dexamethasone   PT Next Visit Plan increase ROM/strengthening   PT Home Exercise Plan see above handout  Consulted and Agree with Plan of Care Patient        Problem List Patient Active Problem List   Diagnosis Date Noted  . Chest tightness 03/24/2015  . Stool incontinence 03/24/2015  . Urinary frequency 03/04/2015  . Cervical spondylosis with radiculopathy 02/24/2015  . Pre-syncope 02/19/2015  . URI (upper respiratory infection) 02/15/2015  . Neck pain 12/25/2014  . Pelvic pain in female 11/16/2014  . Primary osteoarthritis of hip 08/05/2014  . Heel pain 06/01/2014  . Diarrhea 06/01/2014  . Health care maintenance 06/01/2014  . Right leg pain 03/30/2014  . Right arm pain 03/30/2014  . Sinusitis 03/30/2014  . Internal nasal lesion 11/12/2013  . Rib pain 08/04/2013  . Elevated AFP 04/28/2013  . Osteoporosis 11/12/2012  . Nonspecific (abnormal) findings on radiological and other examination of gastrointestinal tract 05/31/2012  . Bronchiectasis ([place]) 02/05/2012  . Sleep apnea 02/05/2012  . Degenerative disc disease, cervical 02/05/2012  . Hypercholesterolemia 02/05/2012  . GERD (gastroesophageal reflux disease) 02/05/2012  . Chronic headaches 02/05/2012    Georgina Pillion, SPT 04/30/2015, 3:13 PM  [place] [facility] [address]. [place], [place], [address] Phone: [phone]   Fax:  [phone]  Name: Jennifer Hodges MRN: [id number] Date of Birth: 12-Jan-1946

## 2015-05-04 ENCOUNTER — Encounter: Payer: Self-pay | Admitting: Internal Medicine

## 2015-05-04 ENCOUNTER — Ambulatory Visit (INDEPENDENT_AMBULATORY_CARE_PROVIDER_SITE_OTHER): Payer: Medicare Other | Admitting: Internal Medicine

## 2015-05-04 VITALS — BP 120/70 | HR 82 | Temp 98.6°F | Resp 18 | Ht 62.0 in | Wt 176.5 lb

## 2015-05-04 DIAGNOSIS — Z96641 Presence of right artificial hip joint: Secondary | ICD-10-CM | POA: Diagnosis not present

## 2015-05-04 DIAGNOSIS — K219 Gastro-esophageal reflux disease without esophagitis: Secondary | ICD-10-CM | POA: Diagnosis not present

## 2015-05-04 DIAGNOSIS — R159 Full incontinence of feces: Secondary | ICD-10-CM | POA: Diagnosis not present

## 2015-05-04 DIAGNOSIS — M1711 Unilateral primary osteoarthritis, right knee: Secondary | ICD-10-CM | POA: Diagnosis not present

## 2015-05-04 DIAGNOSIS — G4730 Sleep apnea, unspecified: Secondary | ICD-10-CM

## 2015-05-04 DIAGNOSIS — M25561 Pain in right knee: Secondary | ICD-10-CM | POA: Diagnosis not present

## 2015-05-04 DIAGNOSIS — E7800 Pure hypercholesterolemia, unspecified: Secondary | ICD-10-CM

## 2015-05-04 DIAGNOSIS — R739 Hyperglycemia, unspecified: Secondary | ICD-10-CM

## 2015-05-04 DIAGNOSIS — Z0000 Encounter for general adult medical examination without abnormal findings: Secondary | ICD-10-CM

## 2015-05-04 DIAGNOSIS — M4722 Other spondylosis with radiculopathy, cervical region: Secondary | ICD-10-CM

## 2015-05-04 DIAGNOSIS — R0789 Other chest pain: Secondary | ICD-10-CM

## 2015-05-04 DIAGNOSIS — M25551 Pain in right hip: Secondary | ICD-10-CM | POA: Diagnosis not present

## 2015-05-04 DIAGNOSIS — M25562 Pain in left knee: Secondary | ICD-10-CM

## 2015-05-04 NOTE — Progress Notes (Signed)
Pre-visit discussion using our clinic review tool. No additional management support is needed unless otherwise documented below in the visit note.  

## 2015-05-04 NOTE — Progress Notes (Signed)
Patient ID: Jennifer Hodges, female   DOB: 1945/04/10, 70 y.o.   MRN: [id number]   Subjective:    Patient ID: Jennifer Hodges, female    DOB: [birth year], 70 y.o.   MRN: [id number]  HPI  Patient with past history of hypercholesterolemia, GERD and OA.  She comes in today for a scheduled follow up.  Since her visit to the ER, she has not had any further episodes of stool from her vagina.  She saw Dr Kenton Kingfisher.  He referred her to urogyn.  She is scheduled to see them next week.  She is walking.  Also rides her bike.  Her knees have been an issue.  She is due to see Dr Rudene Christians today.  We discussed importance of staying active.  No cardiac symptoms reported with increased activity or exertion.  She does report some chest discomfort since starting mobic.  She has cut back to one a day and has noticed this is better.  Discussed cardiac evaluation.  She declined.  States she knows is coming from the mobic.  Breathing stable.  Shoulder is better.  Saw ortho.  Going to PT.  Increased rom - right shoulder.  Some constipation.  On no narcotic medication now.  She is adjusting her bowel regimen - which helps.  Desires no further intervention at this time.     Past Medical History  Diagnosis Date  . Hypercholesterolemia   . Bronchiectasis (HCC)     mild  . Asthma   . GERD (gastroesophageal reflux disease)     EGD 8/09- non bleeding erosive gastritis, documentd esophageal ulcerations.   . Diverticulosis   . Hiatal hernia   . Osteoarthritis     lumbar disc disease, left hip  . Chronic headaches      followed by [facility] migraines  . PONV (postoperative nausea and vomiting)     "Only with last hip and I believe it was due to the morphine"   . Family history of adverse reaction to anesthesia     PONV  . COPD (chronic obstructive pulmonary disease) ([place])   . History of pneumonia   . Diabetes mellitus without complication [facility])     "doctor says I no longer have diabetes"    . Sleep apnea     cpap asked to  bring mask and tubing  . Weakness of right side of body   . Gall stones     history of   Past Surgical History  Procedure Laterality Date  . Appendectomy    . Cholecystectomy    . Abdominal hysterectomy  age 85  . Right oophorectomy    . Excisional hemorrhoidectomy    . Lumbar laminectomy    . Eus N/A 05/31/2012    Procedure: UPPER ENDOSCOPIC ULTRASOUND (EUS) LINEAR;  Surgeon: [name], MD;  Location: [facility];  Service: Endoscopy;  Laterality: N/A;  . Total hip arthroplasty Left 05/01/2014    Dr. [name]  . Breast surgery Bilateral     cyst removed and reduction  . Back surgery      4th lumbar fusion  . Bladder surgery N/A     with vaginal wall repair  . Cataract extraction w/ intraocular lens implant Bilateral 2015  . Total hip arthroplasty Right 08/05/2014    Procedure: TOTAL HIP ARTHROPLASTY ANTERIOR APPROACH;  Surgeon: [name], MD;  Location: [facility] ORS;  Service: Orthopedics;  Laterality: Right;  . Reduction mammaplasty  1990  . Cardiac catheterization  2014  .  Anterior cervical decomp/discectomy fusion N/A 02/24/2015    Procedure: CERVICAL FOUR-FIVE, CERVICAL FIVE-SIX, CERVICAL SIX-SEVEN ANTERIOR CERVICAL DECOMPRESSION/DISCECTOMY FUSION ;  Surgeon: [name], MD;  Location: [place] [facility];  Service: Neurosurgery;  Laterality: N/A;  C45 C56 C67 anterior cervical decompression with fusion interbody prosthesis plating and bonegraft   Family History  Problem Relation Age of Onset  . Heart disease Mother     s/p stent  . Hypertension Mother   . Hypercholesterolemia Mother   . Diabetes Father   . Stomach cancer      uncle  . Breast cancer Maternal Aunt    Social History   Social History  . Marital Status: Married    Spouse Name: N/A  . Number of Children: N/A  . Years of Education: N/A   Social History Main Topics  . Smoking status: Former Smoker    Quit date: 06/13/2007  . Smokeless tobacco: Never Used  . Alcohol Use: No  . Drug Use: No   . Sexual Activity: No   Other Topics Concern  . None   Social History Narrative    Outpatient Encounter Prescriptions as of 05/04/2015  Medication Sig  . acetaminophen (TYLENOL) 500 MG tablet Take 500 mg by mouth every 8 (eight) hours as needed for mild pain or moderate pain. Reported on 04/06/2015  . Cholecalciferol (VITAMIN D PO) Take 800 Units by mouth.  . Coenzyme Q10 (CO Q 10) 100 MG CAPS Take 100 mg by mouth every morning.   . Garlic 0973 MG CAPS Take 2 capsules by mouth every morning.   Marland Kitchen guaiFENesin (MUCINEX) 600 MG 12 hr tablet Take 600 mg by mouth daily. Reported on 04/06/2015  . meloxicam (MOBIC) 7.5 MG tablet Take 1 tablet (7.5 mg total) by mouth daily.  . ondansetron (ZOFRAN) 4 MG tablet Take 1 tablet (4 mg total) by mouth every 8 (eight) hours as needed for nausea or vomiting.  Marland Kitchen oxyCODONE-acetaminophen (ROXICET) 5-325 MG tablet Take 1 tablet by mouth at bedtime as needed for severe pain.  . pantoprazole (PROTONIX) 40 MG tablet Take 1 tablet (40 mg total) by mouth 2 (two) times daily before a meal.  . traMADol (ULTRAM) 50 MG tablet Take 40 mg by mouth once.  . [DISCONTINUED] LINZESS 145 MCG CAPS capsule    No facility-administered encounter medications on file as of 05/04/2015.    Review of Systems  Constitutional: Negative for appetite change and unexpected weight change.  HENT: Negative for congestion and sinus pressure.   Respiratory: Negative for cough, chest tightness and shortness of breath.   Cardiovascular: Negative for chest pain, palpitations and leg swelling.  Gastrointestinal: Positive for constipation. Negative for nausea, vomiting, abdominal pain and diarrhea.  Genitourinary: Negative for dysuria and difficulty urinating.  Musculoskeletal:       Shoulder is better.  Going to PT.  Increased knee pain.   Skin: Negative for color change and rash.  Neurological: Negative for dizziness, light-headedness and headaches.  Psychiatric/Behavioral: Negative for  dysphoric mood and agitation.       Objective:    Physical Exam  Constitutional: She appears well-developed and well-nourished. No distress.  HENT:  Nose: Nose normal.  Mouth/Throat: Oropharynx is clear and moist.  Eyes: Conjunctivae are normal. Right eye exhibits no discharge. Left eye exhibits no discharge.  Neck: Neck supple. No thyromegaly present.  Cardiovascular: Normal rate and regular rhythm.   Pulmonary/Chest: Breath sounds normal. No respiratory distress. She has no wheezes.  Abdominal: Soft. Bowel sounds are normal. There  is no tenderness.  Musculoskeletal: She exhibits no edema or tenderness.  Lymphadenopathy:    She has no cervical adenopathy.  Skin: No rash noted. No erythema.  Psychiatric: She has a normal mood and affect. Her behavior is normal.    BP 120/70 mmHg  Pulse 82  Temp(Src) 98.6 F (37 C) (Oral)  Resp 18  Ht '5\' 2"'$  (1.575 m)  Wt 176 lb 8 oz (80.06 kg)  BMI 32.27 kg/m2  SpO2 96% Wt Readings from Last 3 Encounters:  05/04/15 176 lb 8 oz (80.06 kg)  04/18/15 176 lb (79.833 kg)  04/16/15 176 lb 12 oz (80.173 kg)     Lab Results  Component Value Date   WBC 7.2 03/09/2015   HGB 12.4 03/09/2015   HCT 37.3 03/09/2015   PLT 397 03/09/2015   GLUCOSE 106* 03/09/2015   CHOL 191 11/14/2014   TRIG 120.0 11/14/2014   HDL 49.80 11/14/2014   LDLDIRECT 147.5 11/07/2012   LDLCALC 117* 11/14/2014   ALT 12 11/14/2014   AST 19 11/14/2014   NA 139 03/09/2015   K 4.0 03/09/2015   CL 104 03/09/2015   CREATININE 0.44 03/09/2015   BUN 8 03/09/2015   CO2 28 03/09/2015   TSH 2.53 11/14/2014   INR 0.92 07/22/2014   HGBA1C 5.7* 02/18/2015    Dg Shoulder Right  04/18/2015  CLINICAL DATA:  Right upper arm pain for 3 weeks. No definite injury. EXAM: RIGHT SHOULDER - 2+ VIEW COMPARISON:  None. FINDINGS: There is no evidence of fracture or dislocation. There appears to be fusion of the right acromioclavicular joint which may be due to degenerative change. Soft  tissues are unremarkable. IMPRESSION: Probable fusion of the right acromioclavicular joint due to degenerative disease. No acute abnormality seen in the right shoulder. Electronically Signed   By: Marijo Conception, M.D.   On: 04/18/2015 12:24       Assessment & Plan:   Problem List Items Addressed This Visit    Cervical spondylosis with radiculopathy    S/p surgery.  Doing well.        Chest tightness    She relates to taking the mobic.  Discussed with her regarding further cardiac w/up.  She declines.  States is better since decreasing mobic.  Follow.        GERD (gastroesophageal reflux disease)    On protonix.  Taking mobic.  Since cutting back chest discomfort better.  Would try to avoid antiinflammatories.        Health care maintenance - Primary   Hypercholesterolemia    Low cholesterol diet and exercise.  Follow lipid panel.        Relevant Orders   Hepatic function panel   Lipid panel   Knee pain    Following with Dr Rudene Christians.        Sleep apnea    Continue CPAP.        Stool incontinence    Had noticed passage of stool through her vagina.  See previous note for details.  Saw gyn - Dr Kenton Kingfisher.  Was referred to urogyn.  Has appt next week.  No further episodes.         Other Visit Diagnoses    Hyperglycemia        Relevant Orders    Hemoglobin V7C    Basic metabolic panel        Einar Pheasant, MD

## 2015-05-05 ENCOUNTER — Ambulatory Visit: Payer: Medicare Other | Admitting: Physical Therapy

## 2015-05-05 ENCOUNTER — Encounter: Payer: Self-pay | Admitting: Physical Therapy

## 2015-05-05 DIAGNOSIS — M25511 Pain in right shoulder: Secondary | ICD-10-CM | POA: Diagnosis not present

## 2015-05-05 DIAGNOSIS — M6281 Muscle weakness (generalized): Secondary | ICD-10-CM | POA: Diagnosis not present

## 2015-05-05 DIAGNOSIS — M25611 Stiffness of right shoulder, not elsewhere classified: Secondary | ICD-10-CM

## 2015-05-05 DIAGNOSIS — M25619 Stiffness of unspecified shoulder, not elsewhere classified: Secondary | ICD-10-CM

## 2015-05-05 DIAGNOSIS — M7580 Other shoulder lesions, unspecified shoulder: Secondary | ICD-10-CM | POA: Diagnosis not present

## 2015-05-05 NOTE — Therapy (Cosign Needed)
[place] [facility] [address] [place], [place], [address] Phone: [phone]   Fax:  [phone]  Physical Therapy Treatment  Patient Details  Name: Jennifer Hodges MRN: [id number] Date of Birth: 02/23/1946 Referring Provider: Dr. Mack Guise  Encounter Date: 05/05/2015      PT End of Session - 05/05/15 1035    Visit Number 4   Number of Visits 8   Authorization - Visit Number 4   Authorization - Number of Visits 10   PT Start Time 8099   PT Stop Time 1038   PT Time Calculation (min) 52 min   Activity Tolerance Patient tolerated treatment well   Behavior During Therapy [facility] for tasks assessed/performed      Past Medical History  Diagnosis Date  . Hypercholesterolemia   . Bronchiectasis (HCC)     mild  . Asthma   . GERD (gastroesophageal reflux disease)     EGD 8/09- non bleeding erosive gastritis, documentd esophageal ulcerations.   . Diverticulosis   . Hiatal hernia   . Osteoarthritis     lumbar disc disease, left hip  . Chronic headaches      followed by [facility] migraines  . PONV (postoperative nausea and vomiting)     "Only with last hip and I believe it was due to the morphine"   . Family history of adverse reaction to anesthesia     PONV  . COPD (chronic obstructive pulmonary disease) ([place])   . History of pneumonia   . Diabetes mellitus without complication [facility])     "doctor says I no longer have diabetes"    . Sleep apnea     cpap asked to bring mask and tubing  . Weakness of right side of body   . Gall stones     history of    Past Surgical History  Procedure Laterality Date  . Appendectomy    . Cholecystectomy    . Abdominal hysterectomy  age 70  . Right oophorectomy    . Excisional hemorrhoidectomy    . Lumbar laminectomy    . Eus N/A 05/31/2012    Procedure: UPPER ENDOSCOPIC ULTRASOUND (EUS) LINEAR;  Surgeon: [name], MD;  Location: [facility];  Service: Endoscopy;  Laterality: N/A;  .  Total hip arthroplasty Left 05/01/2014    Dr. [name]  . Breast surgery Bilateral     cyst removed and reduction  . Back surgery      4th lumbar fusion  . Bladder surgery N/A     with vaginal wall repair  . Cataract extraction w/ intraocular lens implant Bilateral 2015  . Total hip arthroplasty Right 08/05/2014    Procedure: TOTAL HIP ARTHROPLASTY ANTERIOR APPROACH;  Surgeon: [name], MD;  Location: [facility] ORS;  Service: Orthopedics;  Laterality: Right;  . Reduction mammaplasty  1990  . Cardiac catheterization  2014  . Anterior cervical decomp/discectomy fusion N/A 02/24/2015    Procedure: CERVICAL FOUR-FIVE, CERVICAL FIVE-SIX, CERVICAL SIX-SEVEN ANTERIOR CERVICAL DECOMPRESSION/DISCECTOMY FUSION ;  Surgeon: [name], MD;  Location: [place] [facility];  Service: Neurosurgery;  Laterality: N/A;  C45 C56 C67 anterior cervical decompression with fusion interbody prosthesis plating and bonegraft    There were no vitals filed for this visit.  Visit Diagnosis:  Pain in joint of right shoulder  Decreased range of motion (ROM) of shoulder  Muscle weakness  Shoulder joint stiffness, right      Subjective Assessment - 05/05/15 1729    Subjective Pt  reports 5/10 R shoulder pain today and has not been able to sleep well the past couple of nights. Pt reports she has been following HEP.   Pertinent History Pt has had B THA   Limitations House hold activities   Diagnostic tests XRAY   Patient Stated Goals decrease pain and be able to complete household activities.   Currently in Pain? Yes   Pain Score 5    Pain Location Shoulder   Pain Orientation Right   Pain Descriptors / Indicators Sore   Pain Type Acute pain   Pain Onset 1 to 4 weeks ago      Objective: UBE: 4 mins forwards and 4 mins backwards (no charge/warm-up). Manual: Distraction x 4 for 30 seconds. AP/Inferior mobilization Grade III x 3 for 30 seconds. STM to R biceps area, pt is tender to touch. Ther ex: Shoulder  flexion/abduction with 2#- 2 x 10 with no UT compensation noted. Bicep curls with 2#- 2 x 10. Nautilus: lat pulldown 20#/tricep extension #10/ER #10/IR #10 all 2 x 10. Verbal and tactile cueing required for IR/ER for proper form. Rhythmic stabilizations in standing with shoulder in 90 flexion x 3 for 30 seconds.  Pt response to treatment for medical necessity: Pt reports decrease in pain after treatment session. Pt will benefit from skilled PT to increase shoulder strength in order to complete daily tasks with ease such as putting dishes away. 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     PT Long Term Goals - 04/23/15 1229    PT LONG TERM GOAL #1   Title Pt will decrease her QuickDash score to <50% in order to decrease pain to sleep through the night.   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   Baseline AROM 114 deg   Time 4   Period Weeks   Status New   PT LONG TERM GOAL #3   Title Pt will increase R shoulder flex to 4/5 in order to perform household tasks which require repetitive motions like sweeping.    Baseline R shoulder flex 3/5   Time 4   Period Weeks   Status New   PT LONG TERM GOAL #4   Title Pt. able to sleep on R shoulder with no c/o pain to improve rest/ pain-free mobility.    Time 4   Period Weeks   Status New            Plan - 05/05/15 1735    Clinical Impression Statement Hypomobility noted with AP/Inf mobilizations of R shoulder and pt is tender to palpation around R shoulder joint/biceps region. Pt reports relief with manual distraction. Pt demostrates good endurance with rhythmic stabilizations (and throughout treatment session) and no reports of increased pain. Pt requires verbal and tactile cueing with Nautilus exercises : triceps extension, IR/ER to keep elbow close to body and to  decrease wrist extension compensation. Pt demonstrates good form with shoulder flexion and abduction with no upper trap compensation noted. Pt continues to progress towards goals of increasing range of motion and strength.     Pt will benefit from skilled therapeutic intervention in order to improve on the following deficits Decreased endurance;Decreased mobility;Decreased strength;Decreased range of motion;Hypomobility;Impaired flexibility;Impaired UE functional use;Improper body mechanics;Postural dysfunction;Pain  Rehab Potential Good   PT Frequency 2x / week   PT Duration 4 weeks   PT Treatment/Interventions ADLs/Self Care Home Management;Cryotherapy;Electrical Stimulation;Moist Heat;Ultrasound;Functional mobility training;Therapeutic activities;Therapeutic exercise;Patient/family education;Manual techniques;Passive range of motion;Dry needling;Iontophoresis '4mg'$ /ml Dexamethasone   PT Next Visit Plan increase ROM/strengthening   PT Home Exercise Plan see above handout   Consulted and Agree with Plan of Care Patient        Problem List Patient Active Problem List   Diagnosis Date Noted  . Chest tightness 03/24/2015  . Stool incontinence 03/24/2015  . Urinary frequency 03/04/2015  . Cervical spondylosis with radiculopathy 02/24/2015  . Pre-syncope 02/19/2015  . URI (upper respiratory infection) 02/15/2015  . Neck pain 12/25/2014  . Pelvic pain in female 11/16/2014  . Primary osteoarthritis of hip 08/05/2014  . Heel pain 06/01/2014  . Diarrhea 06/01/2014  . Health care maintenance 06/01/2014  . Right leg pain 03/30/2014  . Right arm pain 03/30/2014  . Sinusitis 03/30/2014  . Internal nasal lesion 11/12/2013  . Rib pain 08/04/2013  . Elevated AFP 04/28/2013  . Osteoporosis 11/12/2012  . Nonspecific (abnormal) findings on radiological and other examination of gastrointestinal tract 05/31/2012  . Bronchiectasis ([place]) 02/05/2012  . Sleep apnea 02/05/2012  . Degenerative disc  disease, cervical 02/05/2012  . Hypercholesterolemia 02/05/2012  . GERD (gastroesophageal reflux disease) 02/05/2012  . Chronic headaches 02/05/2012   Pura Spice, PT, DPT # [phone]   05/06/2015, 2:15 PM   [facility] [address] [place], [place], [address] Phone: [phone]   Fax:  [phone]  Name: ALEDA MADL MRN: [id number] Date of Birth: 1945-08-25

## 2015-05-07 ENCOUNTER — Ambulatory Visit: Payer: Medicare Other | Admitting: Physical Therapy

## 2015-05-07 ENCOUNTER — Encounter: Payer: Self-pay | Admitting: Physical Therapy

## 2015-05-07 DIAGNOSIS — M6281 Muscle weakness (generalized): Secondary | ICD-10-CM | POA: Diagnosis not present

## 2015-05-07 DIAGNOSIS — M25611 Stiffness of right shoulder, not elsewhere classified: Secondary | ICD-10-CM | POA: Diagnosis not present

## 2015-05-07 DIAGNOSIS — M25511 Pain in right shoulder: Secondary | ICD-10-CM

## 2015-05-07 DIAGNOSIS — M25619 Stiffness of unspecified shoulder, not elsewhere classified: Secondary | ICD-10-CM

## 2015-05-07 DIAGNOSIS — M7580 Other shoulder lesions, unspecified shoulder: Secondary | ICD-10-CM | POA: Diagnosis not present

## 2015-05-07 NOTE — Therapy (Signed)
[place] [facility] [address] [place], [place], [address] Phone: [phone]   Fax:  [phone]  Physical Therapy Treatment  Patient Details  Name: Jennifer Hodges MRN: [id number] Date of Birth: 02/03/1946 Referring Provider: Dr. Mack Guise  Encounter Date: 05/07/2015      PT End of Session - 05/07/15 1133    Visit Number 5   Number of Visits 8   Date for PT Re-Evaluation 05/21/15   Authorization - Visit Number 5   Authorization - Number of Visits 10   PT Start Time 0945   PT Stop Time 1050   PT Time Calculation (min) 65 min   Activity Tolerance Patient tolerated treatment well   Behavior During Therapy [facility] for tasks assessed/performed      Past Medical History  Diagnosis Date  . Hypercholesterolemia   . Bronchiectasis (HCC)     mild  . Asthma   . GERD (gastroesophageal reflux disease)     EGD 8/09- non bleeding erosive gastritis, documentd esophageal ulcerations.   . Diverticulosis   . Hiatal hernia   . Osteoarthritis     lumbar disc disease, left hip  . Chronic headaches      followed by [facility] migraines  . PONV (postoperative nausea and vomiting)     "Only with last hip and I believe it was due to the morphine"   . Family history of adverse reaction to anesthesia     PONV  . COPD (chronic obstructive pulmonary disease) ([place])   . History of pneumonia   . Diabetes mellitus without complication [facility])     "doctor says I no longer have diabetes"    . Sleep apnea     cpap asked to bring mask and tubing  . Weakness of right side of body   . Gall stones     history of    Past Surgical History  Procedure Laterality Date  . Appendectomy    . Cholecystectomy    . Abdominal hysterectomy  age 70  . Right oophorectomy    . Excisional hemorrhoidectomy    . Lumbar laminectomy    . Eus N/A 05/31/2012    Procedure: UPPER ENDOSCOPIC ULTRASOUND (EUS) LINEAR;  Surgeon: [name], MD;  Location: [facility];   Service: Endoscopy;  Laterality: N/A;  . Total hip arthroplasty Left 05/01/2014    Dr. [name]  . Breast surgery Bilateral     cyst removed and reduction  . Back surgery      4th lumbar fusion  . Bladder surgery N/A     with vaginal wall repair  . Cataract extraction w/ intraocular lens implant Bilateral 2015  . Total hip arthroplasty Right 08/05/2014    Procedure: TOTAL HIP ARTHROPLASTY ANTERIOR APPROACH;  Surgeon: [name], MD;  Location: [facility] ORS;  Service: Orthopedics;  Laterality: Right;  . Reduction mammaplasty  1990  . Cardiac catheterization  2014  . Anterior cervical decomp/discectomy fusion N/A 02/24/2015    Procedure: CERVICAL FOUR-FIVE, CERVICAL FIVE-SIX, CERVICAL SIX-SEVEN ANTERIOR CERVICAL DECOMPRESSION/DISCECTOMY FUSION ;  Surgeon: [name], MD;  Location: [place] [facility];  Service: Neurosurgery;  Laterality: N/A;  C45 C56 C67 anterior cervical decompression with fusion interbody prosthesis plating and bonegraft    There were no vitals filed for this visit.  Visit Diagnosis:  Pain in joint of right shoulder  Decreased range of motion (ROM) of shoulder  Muscle weakness  Shoulder joint stiffness, right      Subjective Assessment -  05/07/15 0949    Subjective Pt reports 4/10 R shoulder pain today and feels that she is able to lift her shoulder higher today. Pt reports she is having less trouble with overhead reaching into high cabinets and with ADLs like washing her hair. Pt reports she has been following HEP.   Pertinent History Pt has had B THA   Limitations House hold activities   Diagnostic tests XRAY   Patient Stated Goals decrease pain and be able to complete household activities.   Currently in Pain? Yes   Pain Score 4    Pain Location Shoulder   Pain Onset 1 to 4 weeks ago   Multiple Pain Sites No      Objective: UBE: 4 mins forwards and 4 mins backwards (no charge/warm-up). Manual: Distraction x 3 for 30 seconds. AP/Inferior mobilization  Grade III/IV x 3 for 30 seconds.Ther ex: Shoulder flexion/abduction with 2#- 2 x 10 with no UT compensation noted. Bicep curls with 2#- 2 x 10. Nautilus: lat pulldown 20#/ tricep extension 20#/ rows 20#/ bicep curls 20#/ shoulder extension 20# all 2 x 10. Verbal and tactile cueing required for IR/ER for proper form. Rhythmic stabilizations in standing with shoulder in 90 flexion x 3 for 30 seconds with ball on wall. Pain noted with hand placement on posterior arm. Standing PNF D1 pattern with 1# 2 x 10. Standing PNF D2 pattern 2 x 10. Bilateral ER with yellow TB 2 x 10. PNF patterns and ER with Yellow TB added to HEP and pt given copy.   Pt response to treatment for medical necessity: Pt reports decrease in pain after treatment session. Pt will benefit from skilled PT to increase shoulder strength in order to complete daily tasks with ease such as putting dishes away. Pt will increase shoulder motion in order to be able to reach into high cabinets in the kitchen.       PT Education - 05/07/15 1131    Education provided Yes   Education Details Pt given shoulder PNF D1/D2 patterns and ER with yellow theraband and instructed to make sure to maintain good posture and form of thera ex. With ER with theraband, pt was instructed to keep the elbows to the side.     Person(s) Educated Patient   Methods Explanation;Demonstration;Tactile cues;Verbal cues;Handout   Comprehension Returned demonstration;Verbalized understanding             PT Long Term Goals - 04/23/15 1229    PT LONG TERM GOAL #1   Title Pt will decrease her QuickDash score to <50% in order to decrease pain to sleep through the night.   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   Baseline AROM 114 deg   Time 4   Period Weeks   Status New   PT LONG TERM GOAL #3   Title Pt will increase R  shoulder flex to 4/5 in order to perform household tasks which require repetitive motions like sweeping.    Baseline R shoulder flex 3/5   Time 4   Period Weeks   Status New   PT LONG TERM GOAL #4   Title Pt. able to sleep on R shoulder with no c/o pain to improve rest/ pain-free mobility.    Time 4   Period Weeks  Status New               Plan - 05/07/15 1135    Clinical Impression Statement Hypomobility evident with AP grade IV mobilizations, but no pain noted. Pt performed shoulder PNF D1 and D2 patterns with smooth movements and was able to perform the whole motions. Pt required verbal and tactile cues to keep elbows to her side during rows and tricep extension exercises. Pt had more increased pain with end-range PNF D2 pattern, especially the abduction and ER components. Pt needed verbal cues to decrease her speed in the PNF patterns and bicep curls.    Pt will benefit from skilled therapeutic intervention in order to improve on the following deficits Decreased endurance;Decreased mobility;Decreased strength;Decreased range of motion;Hypomobility;Impaired flexibility;Impaired UE functional use;Improper body mechanics;Postural dysfunction;Pain   Rehab Potential Good   PT Frequency 2x / week   PT Duration 4 weeks   PT Treatment/Interventions ADLs/Self Care Home Management;Cryotherapy;Electrical Stimulation;Moist Heat;Ultrasound;Functional mobility training;Therapeutic activities;Therapeutic exercise;Patient/family education;Manual techniques;Passive range of motion;Dry needling;Iontophoresis '4mg'$ /ml Dexamethasone   PT Next Visit Plan increase ROM/strengthening   PT Home Exercise Plan see above handout   Consulted and Agree with Plan of Care Patient        Problem List Patient Active Problem List   Diagnosis Date Noted  . Chest tightness 03/24/2015  . Stool incontinence 03/24/2015  . Urinary frequency 03/04/2015  . Cervical spondylosis with radiculopathy 02/24/2015  .  Pre-syncope 02/19/2015  . URI (upper respiratory infection) 02/15/2015  . Neck pain 12/25/2014  . Pelvic pain in female 11/16/2014  . Primary osteoarthritis of hip 08/05/2014  . Heel pain 06/01/2014  . Diarrhea 06/01/2014  . Health care maintenance 06/01/2014  . Right leg pain 03/30/2014  . Right arm pain 03/30/2014  . Sinusitis 03/30/2014  . Internal nasal lesion 11/12/2013  . Rib pain 08/04/2013  . Elevated AFP 04/28/2013  . Osteoporosis 11/12/2012  . Nonspecific (abnormal) findings on radiological and other examination of gastrointestinal tract 05/31/2012  . Bronchiectasis ([place]) 02/05/2012  . Sleep apnea 02/05/2012  . Degenerative disc disease, cervical 02/05/2012  . Hypercholesterolemia 02/05/2012  . GERD (gastroesophageal reflux disease) 02/05/2012  . Chronic headaches 02/05/2012    Georgina Pillion, SPT and Dorice Lamas, SPT 05/07/2015, 1:51 PM  [place] [facility] [address] [place], [place], [address] Phone: [phone]   Fax:  [phone]  Name: CYANI KALLSTROM MRN: [id number] Date of Birth: [birth year]

## 2015-05-08 ENCOUNTER — Telehealth: Payer: Self-pay | Admitting: *Deleted

## 2015-05-08 NOTE — Telephone Encounter (Signed)
Faxed request asking for OSCIMIN SL TABS 0.125 MG, this is not on active nor historic med list. Please advise

## 2015-05-08 NOTE — Telephone Encounter (Signed)
I have never prescribed this medication for pt.  Notify pharmacy to contact previous ordering MD.  Thanks.

## 2015-05-11 ENCOUNTER — Encounter: Payer: Self-pay | Admitting: Internal Medicine

## 2015-05-11 DIAGNOSIS — N828 Other female genital tract fistulae: Secondary | ICD-10-CM | POA: Diagnosis not present

## 2015-05-11 DIAGNOSIS — N952 Postmenopausal atrophic vaginitis: Secondary | ICD-10-CM | POA: Diagnosis not present

## 2015-05-11 DIAGNOSIS — M25569 Pain in unspecified knee: Secondary | ICD-10-CM | POA: Insufficient documentation

## 2015-05-11 DIAGNOSIS — R151 Fecal smearing: Secondary | ICD-10-CM | POA: Diagnosis not present

## 2015-05-11 NOTE — Assessment & Plan Note (Signed)
Had noticed passage of stool through her vagina.  See previous note for details.  Saw gyn - Dr Kenton Kingfisher.  Was referred to urogyn.  Has appt next week.  No further episodes.

## 2015-05-11 NOTE — Telephone Encounter (Signed)
We where just working on refills Friday to help [place] [facility], I'm at [employer], that's why I'm forwarding this to you

## 2015-05-11 NOTE — Assessment & Plan Note (Signed)
Low cholesterol diet and exercise.  Follow lipid panel.   

## 2015-05-11 NOTE — Telephone Encounter (Signed)
Thanks,   Spoke with the patient, her GI doctor at the hospital had prescribed it, but she has changed her mind and doesn't want to take it.  thanks

## 2015-05-11 NOTE — Assessment & Plan Note (Signed)
Continue CPAP.  

## 2015-05-11 NOTE — Assessment & Plan Note (Signed)
She relates to taking the mobic.  Discussed with her regarding further cardiac w/up.  She declines.  States is better since decreasing mobic.  Follow.

## 2015-05-11 NOTE — Assessment & Plan Note (Signed)
S/p surgery.  Doing well.

## 2015-05-11 NOTE — Assessment & Plan Note (Signed)
On protonix.  Taking mobic.  Since cutting back chest discomfort better.  Would try to avoid antiinflammatories.

## 2015-05-11 NOTE — Assessment & Plan Note (Signed)
Following with Dr Rudene Christians.

## 2015-05-12 ENCOUNTER — Ambulatory Visit: Payer: Medicare Other | Admitting: Physical Therapy

## 2015-05-12 DIAGNOSIS — M25619 Stiffness of unspecified shoulder, not elsewhere classified: Secondary | ICD-10-CM

## 2015-05-12 DIAGNOSIS — M25611 Stiffness of right shoulder, not elsewhere classified: Secondary | ICD-10-CM | POA: Diagnosis not present

## 2015-05-12 DIAGNOSIS — M7580 Other shoulder lesions, unspecified shoulder: Secondary | ICD-10-CM | POA: Diagnosis not present

## 2015-05-12 DIAGNOSIS — M6281 Muscle weakness (generalized): Secondary | ICD-10-CM

## 2015-05-12 DIAGNOSIS — M25511 Pain in right shoulder: Secondary | ICD-10-CM

## 2015-05-12 NOTE — Therapy (Signed)
[facility] [address] [place], [place], [address] Phone: [phone]   Fax:  [phone]  Physical Therapy Treatment  Patient Details  Name: Jennifer Hodges MRN: [id number] Date of Birth: 06/27/1945 Referring Provider: Dr. Mack Guise  Encounter Date: 05/12/2015      PT End of Session - 05/12/15 0955    Visit Number 6   Number of Visits 8   Date for PT Re-Evaluation 05/21/15   Authorization - Visit Number 6   Authorization - Number of Visits 10   PT Start Time 3220   PT Stop Time 1035   PT Time Calculation (min) 51 min   Activity Tolerance Patient tolerated treatment well   Behavior During Therapy [facility] for tasks assessed/performed      Past Medical History  Diagnosis Date  . Hypercholesterolemia   . Bronchiectasis (HCC)     mild  . Asthma   . GERD (gastroesophageal reflux disease)     EGD 8/09- non bleeding erosive gastritis, documentd esophageal ulcerations.   . Diverticulosis   . Hiatal hernia   . Osteoarthritis     lumbar disc disease, left hip  . Chronic headaches      followed by [facility] migraines  . PONV (postoperative nausea and vomiting)     "Only with last hip and I believe it was due to the morphine"   . Family history of adverse reaction to anesthesia     PONV  . COPD (chronic obstructive pulmonary disease) ([place])   . History of pneumonia   . Diabetes mellitus without complication [facility])     "doctor says I no longer have diabetes"    . Sleep apnea     cpap asked to bring mask and tubing  . Weakness of right side of body   . Gall stones     history of    Past Surgical History  Procedure Laterality Date  . Appendectomy    . Cholecystectomy    . Abdominal hysterectomy  age 27  . Right oophorectomy    . Excisional hemorrhoidectomy    . Lumbar laminectomy    . Eus N/A 05/31/2012    Procedure: UPPER ENDOSCOPIC ULTRASOUND (EUS) LINEAR;  Surgeon: [name], MD;  Location: [facility];   Service: Endoscopy;  Laterality: N/A;  . Total hip arthroplasty Left 05/01/2014    Dr. [name]  . Breast surgery Bilateral     cyst removed and reduction  . Back surgery      4th lumbar fusion  . Bladder surgery N/A     with vaginal wall repair  . Cataract extraction w/ intraocular lens implant Bilateral 2015  . Total hip arthroplasty Right 08/05/2014    Procedure: TOTAL HIP ARTHROPLASTY ANTERIOR APPROACH;  Surgeon: [name], MD;  Location: [facility] ORS;  Service: Orthopedics;  Laterality: Right;  . Reduction mammaplasty  1990  . Cardiac catheterization  2014  . Anterior cervical decomp/discectomy fusion N/A 02/24/2015    Procedure: CERVICAL FOUR-FIVE, CERVICAL FIVE-SIX, CERVICAL SIX-SEVEN ANTERIOR CERVICAL DECOMPRESSION/DISCECTOMY FUSION ;  Surgeon: [name], MD;  Location: [place] [facility]UR[facility] ORS;  Service: Neurosurgery;  Laterality: N/A;  C45 C56 C67 anterior cervical decompression with fusion interbody prosthesis plating and bonegraft    There were no vitals filed for this visit.  Visit Diagnosis:  Pain in joint of right shoulder  Decreased range of motion (ROM) of shoulder  Muscle weakness  Shoulder joint stiffness, right      Subjective Assessment -  05/12/15 0954    Subjective Pt. reports 2/10 R shoulder pain currently and 5/10 R shoulder pain at night time.  Pt. states she is doing better everyday.  Pt. has been up since 6AM to walk on TM at 1.8 mph for 1 hour.     Pertinent History Pt has had B THA   Limitations House hold activities   Diagnostic tests XRAY   Patient Stated Goals decrease pain and be able to complete household activities.   Currently in Pain? Yes   Pain Score 2    Pain Location Shoulder   Pain Orientation Right          Objective: UBE: 4 mins forwards and 4 mins backwards (no charge/warm-up).  Ther ex: Shoulder flexion/abduction with 2# (2 x 10) with no UT compensation noted.  Standing serratus punches 1# 20x each (cuing for posture/ core  activation).  Bicep curls with 3# 25x.  Nautilus: lat pulldown 20#/ tricep extension 20#/ sh. Adduction 20#/ rows 20#/ shoulder extension 20# all 2 x 10.  Rhythmic stabilizations in supine position 3x30 sec. (min. To mod. Resistance).  Supine wt. Wand shoulder flexion/ press-ups/ abd. AAROM 20x each.  Manual:  Supine R shoulder AP/PA/Inferior mobilization Grade III x 3 for 30 seconds each.  STM to R UT/ deltoid/ triceps/ biceps region.   Pt response to treatment for medical necessity: Pt reports decrease in pain after treatment session. Pt will benefit from skilled PT to increase shoulder strength in order to complete daily tasks with ease such as putting dishes away. Pt will increase shoulder motion in order to be able to reach into high cabinets in the kitchen.         PT Long Term Goals - 04/23/15 1229    PT LONG TERM GOAL #1   Title Pt will decrease her QuickDash score to <50% in order to decrease pain to sleep through the night.   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   Baseline AROM 114 deg   Time 4   Period Weeks   Status New   PT LONG TERM GOAL #3   Title Pt will increase R shoulder flex to 4/5 in order to perform household tasks which require repetitive motions like sweeping.    Baseline R shoulder flex 3/5   Time 4   Period Weeks   Status New   PT LONG TERM GOAL #4   Title Pt. able to sleep on R shoulder with no c/o pain to improve rest/ pain-free mobility.    Time 4   Period Weeks   Status New               Plan - 05/12/15 1344    Clinical Impression Statement Minimal c/o R shoulder discomfort with ROM/ strength progression.  L shoulder crepitus and R knee/hip discomfort with prolonged standing and walking tasks in clinic.  R shoulder AROM WFL (all planes).  Good technique with R/L shoulder PNF patterns with 1# wts. in  sitting posture.  Pt. tends to rush through ther.ex. and requires cuing to slow down to promote eccentric muscle control/ strengthening.     Pt will benefit from skilled therapeutic intervention in order to improve on the following deficits Decreased endurance;Decreased mobility;Decreased strength;Decreased range of motion;Hypomobility;Impaired flexibility;Impaired UE functional use;Improper  body mechanics;Postural dysfunction;Pain   Rehab Potential Good   PT Frequency 2x / week   PT Duration 4 weeks   PT Treatment/Interventions ADLs/Self Care Home Management;Cryotherapy;Electrical Stimulation;Moist Heat;Ultrasound;Functional mobility training;Therapeutic activities;Therapeutic exercise;Patient/family education;Manual techniques;Passive range of motion;Dry needling;Iontophoresis '4mg'$ /ml Dexamethasone   PT Next Visit Plan increase ROM/strengthening   PT Home Exercise Plan see above handout   Consulted and Agree with Plan of Care Patient        Problem List Patient Active Problem List   Diagnosis Date Noted  . Knee pain 05/11/2015  . Chest tightness 03/24/2015  . Stool incontinence 03/24/2015  . Urinary frequency 03/04/2015  . Cervical spondylosis with radiculopathy 02/24/2015  . Pre-syncope 02/19/2015  . URI (upper respiratory infection) 02/15/2015  . Neck pain 12/25/2014  . Pelvic pain in female 11/16/2014  . Primary osteoarthritis of hip 08/05/2014  . Heel pain 06/01/2014  . Diarrhea 06/01/2014  . Health care maintenance 06/01/2014  . Right leg pain 03/30/2014  . Right arm pain 03/30/2014  . Sinusitis 03/30/2014  . Internal nasal lesion 11/12/2013  . Rib pain 08/04/2013  . Elevated AFP 04/28/2013  . Osteoporosis 11/12/2012  . Nonspecific (abnormal) findings on radiological and other examination of gastrointestinal tract 05/31/2012  . Bronchiectasis ([place]) 02/05/2012  . Sleep apnea 02/05/2012  . Degenerative disc disease, cervical 02/05/2012  . Hypercholesterolemia 02/05/2012   . GERD (gastroesophageal reflux disease) 02/05/2012  . Chronic headaches 02/05/2012   Pura Spice, PT, DPT # [phone]   05/12/2015, 1:50 PM  [facility] [address] [place], [place], [address] Phone: [phone]   Fax:  [phone]  Name: Jennifer Hodges MRN: [id number] Date of Birth: 1945-04-19

## 2015-05-14 ENCOUNTER — Ambulatory Visit: Payer: Medicare Other | Attending: Orthopedic Surgery | Admitting: Physical Therapy

## 2015-05-14 ENCOUNTER — Encounter: Payer: Self-pay | Admitting: Physical Therapy

## 2015-05-14 DIAGNOSIS — M25511 Pain in right shoulder: Secondary | ICD-10-CM | POA: Diagnosis not present

## 2015-05-14 DIAGNOSIS — M25611 Stiffness of right shoulder, not elsewhere classified: Secondary | ICD-10-CM | POA: Diagnosis not present

## 2015-05-14 DIAGNOSIS — M6281 Muscle weakness (generalized): Secondary | ICD-10-CM | POA: Insufficient documentation

## 2015-05-14 DIAGNOSIS — M25619 Stiffness of unspecified shoulder, not elsewhere classified: Secondary | ICD-10-CM

## 2015-05-14 DIAGNOSIS — M7580 Other shoulder lesions, unspecified shoulder: Secondary | ICD-10-CM | POA: Insufficient documentation

## 2015-05-14 NOTE — Therapy (Signed)
[facility] [address] [place], [place], [address] Phone: [phone]   Fax:  [phone]  Physical Therapy Treatment  Patient Details  Name: Jennifer Hodges MRN: [id number] Date of Birth: 1946/01/02 Referring Provider: Dr. Mack Guise  Encounter Date: 05/14/2015      PT End of Session - 05/14/15 1032    Visit Number 7   Number of Visits 8   Date for PT Re-Evaluation 05/21/15   Authorization - Visit Number 7   Authorization - Number of Visits 10   PT Start Time 0941   PT Stop Time 1032   PT Time Calculation (min) 51 min   Activity Tolerance Patient tolerated treatment well   Behavior During Therapy [facility] for tasks assessed/performed      Past Medical History  Diagnosis Date  . Hypercholesterolemia   . Bronchiectasis (HCC)     mild  . Asthma   . GERD (gastroesophageal reflux disease)     EGD 8/09- non bleeding erosive gastritis, documentd esophageal ulcerations.   . Diverticulosis   . Hiatal hernia   . Osteoarthritis     lumbar disc disease, left hip  . Chronic headaches      followed by [facility] migraines  . PONV (postoperative nausea and vomiting)     "Only with last hip and I believe it was due to the morphine"   . Family history of adverse reaction to anesthesia     PONV  . COPD (chronic obstructive pulmonary disease) ([place])   . History of pneumonia   . Diabetes mellitus without complication [facility])     "doctor says I no longer have diabetes"    . Sleep apnea     cpap asked to bring mask and tubing  . Weakness of right side of body   . Gall stones     history of    Past Surgical History  Procedure Laterality Date  . Appendectomy    . Cholecystectomy    . Abdominal hysterectomy  age 1  . Right oophorectomy    . Excisional hemorrhoidectomy    . Lumbar laminectomy    . Eus N/A 05/31/2012    Procedure: UPPER ENDOSCOPIC ULTRASOUND (EUS) LINEAR;  Surgeon: [name], MD;  Location: [facility];   Service: Endoscopy;  Laterality: N/A;  . Total hip arthroplasty Left 05/01/2014    Dr. [name]  . Breast surgery Bilateral     cyst removed and reduction  . Back surgery      4th lumbar fusion  . Bladder surgery N/A     with vaginal wall repair  . Cataract extraction w/ intraocular lens implant Bilateral 2015  . Total hip arthroplasty Right 08/05/2014    Procedure: TOTAL HIP ARTHROPLASTY ANTERIOR APPROACH;  Surgeon: [name], MD;  Location: [facility] ORS;  Service: Orthopedics;  Laterality: Right;  . Reduction mammaplasty  1990  . Cardiac catheterization  2014  . Anterior cervical decomp/discectomy fusion N/A 02/24/2015    Procedure: CERVICAL FOUR-FIVE, CERVICAL FIVE-SIX, CERVICAL SIX-SEVEN ANTERIOR CERVICAL DECOMPRESSION/DISCECTOMY FUSION ;  Surgeon: [name], MD;  Location: [place] NEURO ORS;  Service: Neurosurgery;  Laterality: N/A;  C45 C56 C67 anterior cervical decompression with fusion interbody prosthesis plating and bonegraft    There were no vitals filed for this visit.  Visit Diagnosis:  Pain in joint of right shoulder  Decreased range of motion (ROM) of shoulder  Muscle weakness  Shoulder joint stiffness, right      Subjective Assessment -  05/14/15 1033    Subjective Pt reports 4/10 upon entering clinic today. Pt reports she is not having difficulty with any ADLs or IADLs. Pt had no questioning concerning HEP and agrees with discharge from PT at this time. .   Pertinent History Pt has had B THA   Limitations House hold activities   Diagnostic tests XRAY   Patient Stated Goals decrease pain and be able to complete household activities.   Currently in Pain? Yes   Pain Score 4    Pain Location Shoulder   Pain Orientation Right   Pain Descriptors / Indicators Dull;Aching   Multiple Pain Sites No         Objective: UBE: 4 mins forwards and 4 mins backwards (no charge/warm-up). Ther ex: Shoulder flexion/abduction with 2# (2 x 10) with no UT compensation  noted. Standing serratus punches 2# 20x each (cuing for posture/ core activation). Bicep curls with 3# 25x. PNF (D1/D2) Patterns in standing 1# Nautilus: lat pulldown 20#/ tricep extension 20#/ sh. Adduction 20#/ rows 20#/ shoulder extension 20# all 2 x 10. Rhythmic stabilizations in supine position 3x30 sec. (min. To mod. Resistance). Supine wt. Wand shoulder flexion/ press-ups/ abd. AAROM 20x each. Reviewed HEP in depth.    Pt response to treatment for medical necessity: Pt reports decrease in pain after treatment session. Pt will benefit from skilled PT to increase shoulder strength in order to complete daily tasks with ease such as putting dishes away. Pt. Will continue with HEP on a consistent basis and instructed to contact PT if any further issues persist.           PT Long Term Goals - 05/14/15 1024    PT LONG TERM GOAL #1   Title Pt will decrease her QuickDash score to <50% in order to decrease pain to sleep through the night.   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   Baseline R shoulder AROM WNL all planes.   Time 4   Period Weeks   Status Achieved   PT LONG TERM GOAL #3   Title Pt will increase R shoulder flex to 4/5 in order to perform household tasks which require repetitive motions like sweeping.    Baseline R shoulder flexion 4/5 MMT, biceps/triceps 4+/5 MMT.   Grip strength: L=48#, R=47#.    Time 4   Period Weeks   Status Achieved   PT LONG TERM GOAL #4   Title Pt. able to sleep on R shoulder with no c/o pain to improve rest/ pain-free mobility.    Time 4   Period Weeks   Status Achieved               Plan - 05/14/15 1036    Clinical Impression Statement Pt demonstrates full AROM of bilateral shoulders. Pt also has 4/5 MMT shoulder strength bilaterally, and demonstrates good form and posture  in exercises such as ER in standing. Pt performs PNF patterns (D1/D2) with good control and is able to perform motions to the full ranges. Pt demonstrates good control with tricep extension exercises and requires minimal cues to keep elbow at her side. All PT goals were met at this time.     Pt will benefit from skilled therapeutic intervention in order to improve on the following deficits Decreased  endurance;Decreased mobility;Decreased strength;Decreased range of motion;Hypomobility;Impaired flexibility;Impaired UE functional use;Improper body mechanics;Postural dysfunction;Pain   Rehab Potential Good   PT Frequency 2x / week   PT Duration 4 weeks   PT Treatment/Interventions ADLs/Self Care Home Management;Cryotherapy;Electrical Stimulation;Moist Heat;Ultrasound;Functional mobility training;Therapeutic activities;Therapeutic exercise;Patient/family education;Manual techniques;Passive range of motion;Dry needling;Iontophoresis '4mg'$ /ml Dexamethasone   PT Next Visit Plan Discharge from PT at this time.    PT Home Exercise Plan see above handout          G-Codes - 05/29/2015 1402    Functional Assessment Tool Used QuickDASH/ clinical impression/ pain/ ROM   Functional Limitation Carrying, moving and handling objects   Carrying, Moving and Handling Objects Current Status [id number]) At least 1 percent but less than 20 percent impaired, limited or restricted   Carrying, Moving and Handling Objects Goal Status ([id number]) At least 1 percent but less than 20 percent impaired, limited or restricted   Carrying, Moving and Handling Objects Discharge Status [phone]) At least 1 percent but less than 20 percent impaired, limited or restricted      Problem List Patient Active Problem List   Diagnosis Date Noted  . Knee pain 05/11/2015  . Chest tightness 03/24/2015  . Stool incontinence 03/24/2015  . Urinary frequency 03/04/2015  . Cervical spondylosis with radiculopathy 02/24/2015  . Pre-syncope 02/19/2015  .  URI (upper respiratory infection) 02/15/2015  . Neck pain 12/25/2014  . Pelvic pain in female 11/16/2014  . Primary osteoarthritis of hip 08/05/2014  . Heel pain 06/01/2014  . Diarrhea 06/01/2014  . Health care maintenance 06/01/2014  . Right leg pain 03/30/2014  . Right arm pain 03/30/2014  . Sinusitis 03/30/2014  . Internal nasal lesion 11/12/2013  . Rib pain 08/04/2013  . Elevated AFP 04/28/2013  . Osteoporosis 11/12/2012  . Nonspecific (abnormal) findings on radiological and other examination of gastrointestinal tract 05/31/2012  . Bronchiectasis ([place]) 02/05/2012  . Sleep apnea 02/05/2012  . Degenerative disc disease, cervical 02/05/2012  . Hypercholesterolemia 02/05/2012  . GERD (gastroesophageal reflux disease) 02/05/2012  . Chronic headaches 02/05/2012   Pura Spice, PT, DPT # [phone]   05/15/2015, 2:04 PM  [facility] [address] [place], [place], [address] Phone: [phone]   Fax:  [phone]  Name: Jennifer Hodges MRN: [id number] Date of Birth: 1945-03-26

## 2015-05-15 ENCOUNTER — Telehealth: Payer: Self-pay | Admitting: *Deleted

## 2015-05-15 NOTE — Telephone Encounter (Signed)
Patient has requested to have a referral to see Dr Rudene Christians for her knee again.  She stated that , after receiving treatment, she still has issues, and was advised by Dr. Rudene Christians to return to him, if her leg was not better in ten days.  Please advise [phone]

## 2015-05-19 ENCOUNTER — Other Ambulatory Visit: Payer: Self-pay

## 2015-05-21 ENCOUNTER — Other Ambulatory Visit: Payer: Self-pay | Admitting: Orthopedic Surgery

## 2015-05-21 DIAGNOSIS — M175 Other unilateral secondary osteoarthritis of knee: Secondary | ICD-10-CM

## 2015-05-25 DIAGNOSIS — Z961 Presence of intraocular lens: Secondary | ICD-10-CM | POA: Diagnosis not present

## 2015-06-01 DIAGNOSIS — J069 Acute upper respiratory infection, unspecified: Secondary | ICD-10-CM | POA: Diagnosis not present

## 2015-06-01 DIAGNOSIS — J3489 Other specified disorders of nose and nasal sinuses: Secondary | ICD-10-CM | POA: Diagnosis not present

## 2015-06-01 DIAGNOSIS — R05 Cough: Secondary | ICD-10-CM | POA: Diagnosis not present

## 2015-06-02 DIAGNOSIS — M7541 Impingement syndrome of right shoulder: Secondary | ICD-10-CM | POA: Diagnosis not present

## 2015-06-02 DIAGNOSIS — M5412 Radiculopathy, cervical region: Secondary | ICD-10-CM | POA: Diagnosis not present

## 2015-06-08 ENCOUNTER — Telehealth: Payer: Self-pay | Admitting: Internal Medicine

## 2015-06-08 NOTE — Telephone Encounter (Signed)
Pt is requesting to stop taking Zofran for stomach cramps. Please advise, thanks

## 2015-06-08 NOTE — Telephone Encounter (Signed)
Pt called about wanting to stop taking the medication of Osciminsi Tab 0.125 mg. Pill that goes under the tough for stomach cramps.  I advised pt that it's not listed on her medication list. Thank you!

## 2015-06-09 NOTE — Telephone Encounter (Signed)
Notified pt. 

## 2015-06-09 NOTE — Telephone Encounter (Signed)
Since GI prescribed and she sees them for her bowel issues, I recommend calling GI and letting them know of the problems she is having.  Let us know if any problems.

## 2015-06-09 NOTE — Telephone Encounter (Signed)
Dr. Nicki Reaper I called Jennifer Hodges to get clarity on what she is currently taking and she stated that she is no longer taking Zofran she stopped it months ago, the drug that she stopped using recently is Hyoscyamine 0.'125mg'$  for stomach cramps. Pt states that it is not working anymore. Please advise, thanks

## 2015-06-09 NOTE — Telephone Encounter (Signed)
Please make sure she is definitely talking about zofran.  zofran is a medication that is usually taken as needed to help with nausea.  This is not a medication that is taken regularly.  I am not sure that I understand the request because I would not think this is something she is taking regularly anyway.   I am ok if she holds zofran.  She also needs to let MD who prescribed know of issues.

## 2015-06-12 ENCOUNTER — Ambulatory Visit
Admission: RE | Admit: 2015-06-12 | Discharge: 2015-06-12 | Disposition: A | Discharge status: Home / Self Care | Payer: Medicare Other | Source: Ambulatory Visit | Attending: Orthopedic Surgery | Admitting: Orthopedic Surgery

## 2015-06-12 DIAGNOSIS — S83281A Other tear of lateral meniscus, current injury, right knee, initial encounter: Secondary | ICD-10-CM | POA: Diagnosis not present

## 2015-06-12 DIAGNOSIS — S83271A Complex tear of lateral meniscus, current injury, right knee, initial encounter: Secondary | ICD-10-CM | POA: Insufficient documentation

## 2015-06-12 DIAGNOSIS — X58XXXA Exposure to other specified factors, initial encounter: Secondary | ICD-10-CM | POA: Insufficient documentation

## 2015-06-12 DIAGNOSIS — M25461 Effusion, right knee: Secondary | ICD-10-CM | POA: Insufficient documentation

## 2015-06-12 DIAGNOSIS — M7121 Synovial cyst of popliteal space [Baker], right knee: Secondary | ICD-10-CM | POA: Diagnosis not present

## 2015-06-12 DIAGNOSIS — M175 Other unilateral secondary osteoarthritis of knee: Secondary | ICD-10-CM

## 2015-06-15 DIAGNOSIS — Z6832 Body mass index (BMI) 32.0-32.9, adult: Secondary | ICD-10-CM | POA: Diagnosis not present

## 2015-06-15 DIAGNOSIS — M4712 Other spondylosis with myelopathy, cervical region: Secondary | ICD-10-CM | POA: Diagnosis not present

## 2015-06-16 DIAGNOSIS — J310 Chronic rhinitis: Secondary | ICD-10-CM | POA: Diagnosis not present

## 2015-06-16 DIAGNOSIS — R05 Cough: Secondary | ICD-10-CM | POA: Diagnosis not present

## 2015-06-16 DIAGNOSIS — J479 Bronchiectasis, uncomplicated: Secondary | ICD-10-CM | POA: Diagnosis not present

## 2015-06-19 DIAGNOSIS — S83271D Complex tear of lateral meniscus, current injury, right knee, subsequent encounter: Secondary | ICD-10-CM | POA: Diagnosis not present

## 2015-06-22 ENCOUNTER — Telehealth: Payer: Self-pay | Admitting: *Deleted

## 2015-06-22 NOTE — Telephone Encounter (Signed)
I do not understand this message.  Does she need medical clearance?  If so, will need an appt.  Also, she sees cardiology and pulmonary and will need their medical clearance/recommendations prior to surgery.

## 2015-06-22 NOTE — Telephone Encounter (Signed)
I have left a VM for patient to return my call.

## 2015-06-22 NOTE — Telephone Encounter (Signed)
Please advise 

## 2015-06-22 NOTE — Telephone Encounter (Signed)
Patient called needing medical clearance for orthoscopic surgery on right knee before April 24 th.

## 2015-06-22 NOTE — Telephone Encounter (Signed)
Voicemail: patient stated that she had her MRI, and was wanting Dr. Nicki Reaper to give the surgeon the okay to give the surgery.  Pt Contact [phone]

## 2015-06-22 NOTE — Telephone Encounter (Signed)
Patient needs an appt for medical clearance.  Can you please schedule prior to her surgery date. thanks

## 2015-06-25 ENCOUNTER — Inpatient Hospital Stay: Admission: RE | Admit: 2015-06-25 | Payer: Medicare Other | Source: Ambulatory Visit

## 2015-06-29 ENCOUNTER — Encounter
Admission: RE | Admit: 2015-06-29 | Discharge: 2015-06-29 | Disposition: A | Discharge status: Home / Self Care | Payer: Medicare Other | Source: Ambulatory Visit | Attending: Orthopedic Surgery | Admitting: Orthopedic Surgery

## 2015-06-29 DIAGNOSIS — X58XXXA Exposure to other specified factors, initial encounter: Secondary | ICD-10-CM | POA: Diagnosis not present

## 2015-06-29 DIAGNOSIS — Z01812 Encounter for preprocedural laboratory examination: Secondary | ICD-10-CM | POA: Diagnosis not present

## 2015-06-29 DIAGNOSIS — M1711 Unilateral primary osteoarthritis, right knee: Secondary | ICD-10-CM | POA: Diagnosis not present

## 2015-06-29 DIAGNOSIS — S83281A Other tear of lateral meniscus, current injury, right knee, initial encounter: Secondary | ICD-10-CM | POA: Insufficient documentation

## 2015-06-29 HISTORY — DX: Cardiac murmur, unspecified: R01.1

## 2015-06-29 LAB — CBC
HCT: 37.5 % (ref 35.0–47.0)
Hemoglobin: 12.8 g/dL (ref 12.0–16.0)
MCH: 30 pg (ref 26.0–34.0)
MCHC: 34 g/dL (ref 32.0–36.0)
MCV: 88.4 fL (ref 80.0–100.0)
Platelets: 250 10*3/uL (ref 150–440)
RBC: 4.25 MIL/uL (ref 3.80–5.20)
RDW: 14.7 % — ABNORMAL HIGH (ref 11.5–14.5)
WBC: 4.1 10*3/uL (ref 3.6–11.0)

## 2015-06-29 LAB — BASIC METABOLIC PANEL
Anion gap: 3 — ABNORMAL LOW (ref 5–15)
BUN: 12 mg/dL (ref 6–20)
CO2: 30 mmol/L (ref 22–32)
Calcium: 9.5 mg/dL (ref 8.9–10.3)
Chloride: 105 mmol/L (ref 101–111)
Creatinine, Ser: 0.56 mg/dL (ref 0.44–1.00)
GFR calc Af Amer: >60 mL/min (ref >60)
GFR calc non Af Amer: >60 mL/min (ref >60)
Glucose, Bld: 87 mg/dL (ref 65–99)
Potassium: 4.2 mmol/L (ref 3.5–5.1)
Sodium: 138 mmol/L (ref 135–145)

## 2015-06-29 NOTE — Patient Instructions (Addendum)
  Your procedure is scheduled on: 07/07/15 Tues Report to Day Surgery.2nd floor medical mall To find out your arrival time please call [phone] between 1PM - 3PM on 07/06/15 mon.  Remember: Instructions that are not followed completely may result in serious medical risk, up to and including death, or upon the discretion of your surgeon and anesthesiologist your surgery may need to be rescheduled.    __x__ 1. Do not eat food or drink liquids after midnight. No gum chewing or hard candies.     ____ 2. No Alcohol for 24 hours before or after surgery.   ____ 3. Bring all medications with you on the day of surgery if instructed.    _x___ 4. Notify your doctor if there is any change in your medical condition     (cold, fever, infections).     Do not wear jewelry, make-up, hairpins, clips or nail polish.  Do not wear lotions, powders, or perfumes. You may wear deodorant.  Do not shave 48 hours prior to surgery. Men may shave face and neck.  Do not bring valuables to the hospital.    [facility] is not responsible for any belongings or valuables.               Contacts, dentures or bridgework may not be worn into surgery.  Leave your suitcase in the car. After surgery it may be brought to your room.  For patients admitted to the hospital, discharge time is determined by your                treatment team.   Patients discharged the day of surgery will not be allowed to drive home.   Please read over the following fact sheets that you were given:      _x___ Take these medicines the morning of surgery with A SIP OF WATER:    1. pantoprazole (PROTONIX) 40 MG tablet  2. traMADol (ULTRAM) 50 MG tablet  3.   4.  5.  6.  ____ Fleet Enema (as directed)   __x__ Use CHG Soap as directed  ____ Use inhalers on the day of surgery  ____ Stop metformin 2 days prior to surgery    ____ Take 1/2 of usual insulin dose the night before surgery and none on the morning of surgery.   ____ Stop  Coumadin/Plavix/aspirin on   _x___ Stop Anti-inflammatories on 06/30/15 may take tylenol as needed   ____ Stop supplements until after surgery.    __x__ Bring C-Pap to the hospital.

## 2015-07-03 ENCOUNTER — Ambulatory Visit: Payer: Medicare Other | Admitting: Internal Medicine

## 2015-07-07 ENCOUNTER — Ambulatory Visit: Payer: Medicare Other | Admitting: Anesthesiology

## 2015-07-07 ENCOUNTER — Ambulatory Visit
Admission: RE | Admit: 2015-07-07 | Discharge: 2015-07-07 | Disposition: A | Discharge status: Home / Self Care | Payer: Medicare Other | Source: Ambulatory Visit | Attending: Orthopedic Surgery | Admitting: Orthopedic Surgery

## 2015-07-07 ENCOUNTER — Encounter: Payer: Self-pay | Admitting: *Deleted

## 2015-07-07 ENCOUNTER — Encounter
Admission: RE | Disposition: A | Discharge status: Home / Self Care | Payer: Self-pay | Source: Ambulatory Visit | Attending: Orthopedic Surgery

## 2015-07-07 DIAGNOSIS — Z818 Family history of other mental and behavioral disorders: Secondary | ICD-10-CM | POA: Insufficient documentation

## 2015-07-07 DIAGNOSIS — M1632 Unilateral osteoarthritis resulting from hip dysplasia, left hip: Secondary | ICD-10-CM | POA: Diagnosis not present

## 2015-07-07 DIAGNOSIS — Z8379 Family history of other diseases of the digestive system: Secondary | ICD-10-CM | POA: Insufficient documentation

## 2015-07-07 DIAGNOSIS — Z888 Allergy status to other drugs, medicaments and biological substances status: Secondary | ICD-10-CM | POA: Diagnosis not present

## 2015-07-07 DIAGNOSIS — E7800 Pure hypercholesterolemia, unspecified: Secondary | ICD-10-CM | POA: Insufficient documentation

## 2015-07-07 DIAGNOSIS — Z90710 Acquired absence of both cervix and uterus: Secondary | ICD-10-CM | POA: Insufficient documentation

## 2015-07-07 DIAGNOSIS — Z9049 Acquired absence of other specified parts of digestive tract: Secondary | ICD-10-CM | POA: Insufficient documentation

## 2015-07-07 DIAGNOSIS — Z96642 Presence of left artificial hip joint: Secondary | ICD-10-CM | POA: Diagnosis not present

## 2015-07-07 DIAGNOSIS — M23251 Derangement of posterior horn of lateral meniscus due to old tear or injury, right knee: Secondary | ICD-10-CM | POA: Diagnosis not present

## 2015-07-07 DIAGNOSIS — Z79891 Long term (current) use of opiate analgesic: Secondary | ICD-10-CM | POA: Diagnosis not present

## 2015-07-07 DIAGNOSIS — Z91048 Other nonmedicinal substance allergy status: Secondary | ICD-10-CM | POA: Diagnosis not present

## 2015-07-07 DIAGNOSIS — M792 Neuralgia and neuritis, unspecified: Secondary | ICD-10-CM | POA: Insufficient documentation

## 2015-07-07 DIAGNOSIS — S83281A Other tear of lateral meniscus, current injury, right knee, initial encounter: Secondary | ICD-10-CM | POA: Diagnosis not present

## 2015-07-07 DIAGNOSIS — Z79899 Other long term (current) drug therapy: Secondary | ICD-10-CM | POA: Insufficient documentation

## 2015-07-07 DIAGNOSIS — Z833 Family history of diabetes mellitus: Secondary | ICD-10-CM | POA: Diagnosis not present

## 2015-07-07 DIAGNOSIS — J45909 Unspecified asthma, uncomplicated: Secondary | ICD-10-CM | POA: Insufficient documentation

## 2015-07-07 DIAGNOSIS — Z821 Family history of blindness and visual loss: Secondary | ICD-10-CM | POA: Insufficient documentation

## 2015-07-07 DIAGNOSIS — M659 Synovitis and tenosynovitis, unspecified: Secondary | ICD-10-CM | POA: Diagnosis not present

## 2015-07-07 DIAGNOSIS — I10 Essential (primary) hypertension: Secondary | ICD-10-CM | POA: Diagnosis not present

## 2015-07-07 DIAGNOSIS — Z8249 Family history of ischemic heart disease and other diseases of the circulatory system: Secondary | ICD-10-CM | POA: Diagnosis not present

## 2015-07-07 DIAGNOSIS — X58XXXA Exposure to other specified factors, initial encounter: Secondary | ICD-10-CM | POA: Insufficient documentation

## 2015-07-07 DIAGNOSIS — K219 Gastro-esophageal reflux disease without esophagitis: Secondary | ICD-10-CM | POA: Diagnosis not present

## 2015-07-07 DIAGNOSIS — Z79890 Hormone replacement therapy (postmenopausal): Secondary | ICD-10-CM | POA: Insufficient documentation

## 2015-07-07 DIAGNOSIS — E559 Vitamin D deficiency, unspecified: Secondary | ICD-10-CM | POA: Insufficient documentation

## 2015-07-07 DIAGNOSIS — M1711 Unilateral primary osteoarthritis, right knee: Secondary | ICD-10-CM | POA: Diagnosis not present

## 2015-07-07 DIAGNOSIS — M179 Osteoarthritis of knee, unspecified: Secondary | ICD-10-CM | POA: Diagnosis not present

## 2015-07-07 DIAGNOSIS — E119 Type 2 diabetes mellitus without complications: Secondary | ICD-10-CM | POA: Diagnosis not present

## 2015-07-07 DIAGNOSIS — Z886 Allergy status to analgesic agent status: Secondary | ICD-10-CM | POA: Diagnosis not present

## 2015-07-07 DIAGNOSIS — S83251A Bucket-handle tear of lateral meniscus, current injury, right knee, initial encounter: Secondary | ICD-10-CM | POA: Diagnosis not present

## 2015-07-07 DIAGNOSIS — M5136 Other intervertebral disc degeneration, lumbar region: Secondary | ICD-10-CM | POA: Insufficient documentation

## 2015-07-07 DIAGNOSIS — Z885 Allergy status to narcotic agent status: Secondary | ICD-10-CM | POA: Insufficient documentation

## 2015-07-07 DIAGNOSIS — Z823 Family history of stroke: Secondary | ICD-10-CM | POA: Diagnosis not present

## 2015-07-07 DIAGNOSIS — M810 Age-related osteoporosis without current pathological fracture: Secondary | ICD-10-CM | POA: Insufficient documentation

## 2015-07-07 DIAGNOSIS — K589 Irritable bowel syndrome without diarrhea: Secondary | ICD-10-CM | POA: Diagnosis not present

## 2015-07-07 DIAGNOSIS — M23241 Derangement of anterior horn of lateral meniscus due to old tear or injury, right knee: Secondary | ICD-10-CM | POA: Diagnosis not present

## 2015-07-07 DIAGNOSIS — J984 Other disorders of lung: Secondary | ICD-10-CM | POA: Insufficient documentation

## 2015-07-07 DIAGNOSIS — M65861 Other synovitis and tenosynovitis, right lower leg: Secondary | ICD-10-CM | POA: Diagnosis not present

## 2015-07-07 DIAGNOSIS — J449 Chronic obstructive pulmonary disease, unspecified: Secondary | ICD-10-CM | POA: Diagnosis not present

## 2015-07-07 DIAGNOSIS — G4730 Sleep apnea, unspecified: Secondary | ICD-10-CM | POA: Diagnosis not present

## 2015-07-07 DIAGNOSIS — Z800 Family history of malignant neoplasm of digestive organs: Secondary | ICD-10-CM | POA: Diagnosis not present

## 2015-07-07 DIAGNOSIS — Z86010 Personal history of colonic polyps: Secondary | ICD-10-CM | POA: Insufficient documentation

## 2015-07-07 HISTORY — PX: KNEE ARTHROSCOPY WITH LATERAL MENISECTOMY: SHX6193

## 2015-07-07 LAB — GLUCOSE, CAPILLARY
Glucose-Capillary: 85 mg/dL (ref 65–99)
Glucose-Capillary: 75 mg/dL (ref 65–99)

## 2015-07-07 SURGERY — ARTHROSCOPY, KNEE, WITH LATERAL MENISCECTOMY
Anesthesia: General | Laterality: Right | Wound class: Clean

## 2015-07-07 MED ORDER — ONDANSETRON HCL 4 MG/2ML IJ SOLN
INTRAMUSCULAR | Status: DC | PRN
  Administered 2015-07-07: 4 mg via INTRAVENOUS

## 2015-07-07 MED ORDER — OXYCODONE HCL 5 MG PO TABS: 5.0000 mg | ORAL_TABLET | Freq: Once | ORAL | Status: DC | PRN

## 2015-07-07 MED ORDER — HYDROCODONE-ACETAMINOPHEN 5-325 MG PO TABS: 1.0000 | ORAL_TABLET | Freq: Four times a day (QID) | ORAL | Status: DC | PRN

## 2015-07-07 MED ORDER — METOCLOPRAMIDE HCL 10 MG PO TABS: 5.0000 mg | ORAL_TABLET | Freq: Three times a day (TID) | ORAL | Status: DC | PRN

## 2015-07-07 MED ORDER — LACTATED RINGERS IV SOLN
INTRAVENOUS | Status: DC
  Administered 2015-07-07: 14:00:00 via INTRAVENOUS

## 2015-07-07 MED ORDER — BUPIVACAINE-EPINEPHRINE (PF) 0.5% -1:200000 IJ SOLN
INTRAMUSCULAR | Status: AC
  Filled 2015-07-07: qty 30

## 2015-07-07 MED ORDER — ONDANSETRON HCL 4 MG/2ML IJ SOLN: 4.0000 mg | Freq: Four times a day (QID) | INTRAMUSCULAR | Status: DC | PRN

## 2015-07-07 MED ORDER — FENTANYL CITRATE (PF) 100 MCG/2ML IJ SOLN: 25.0000 ug | INTRAMUSCULAR | Status: DC | PRN

## 2015-07-07 MED ORDER — HYDROCODONE-ACETAMINOPHEN 5-325 MG PO TABS: 1.0000 | ORAL_TABLET | ORAL | Status: DC | PRN

## 2015-07-07 MED ORDER — OXYCODONE HCL 5 MG/5ML PO SOLN: 5.0000 mg | Freq: Once | ORAL | Status: DC | PRN

## 2015-07-07 MED ORDER — METOCLOPRAMIDE HCL 5 MG/ML IJ SOLN: 5.0000 mg | Freq: Three times a day (TID) | INTRAMUSCULAR | Status: DC | PRN

## 2015-07-07 MED ORDER — SODIUM CHLORIDE 0.9 % IV SOLN: INTRAVENOUS | Status: DC

## 2015-07-07 MED ORDER — GLYCOPYRROLATE 0.2 MG/ML IJ SOLN
INTRAMUSCULAR | Status: DC | PRN
  Administered 2015-07-07: 0.2 mg via INTRAVENOUS

## 2015-07-07 MED ORDER — PROPOFOL 10 MG/ML IV BOLUS
INTRAVENOUS | Status: DC | PRN
  Administered 2015-07-07: 120 mg via INTRAVENOUS

## 2015-07-07 MED ORDER — DEXAMETHASONE SODIUM PHOSPHATE 10 MG/ML IJ SOLN
INTRAMUSCULAR | Status: DC | PRN
  Administered 2015-07-07: 10 mg via INTRAVENOUS

## 2015-07-07 MED ORDER — FENTANYL CITRATE (PF) 100 MCG/2ML IJ SOLN
INTRAMUSCULAR | Status: DC | PRN
  Administered 2015-07-07 (×2): 25 ug via INTRAVENOUS
  Administered 2015-07-07: 50 ug via INTRAVENOUS

## 2015-07-07 MED ORDER — MIDAZOLAM HCL 5 MG/5ML IJ SOLN
INTRAMUSCULAR | Status: DC | PRN
  Administered 2015-07-07: 1 mg via INTRAVENOUS

## 2015-07-07 MED ORDER — ONDANSETRON HCL 4 MG PO TABS: 4.0000 mg | ORAL_TABLET | Freq: Four times a day (QID) | ORAL | Status: DC | PRN

## 2015-07-07 MED ORDER — BUPIVACAINE-EPINEPHRINE (PF) 0.5% -1:200000 IJ SOLN
INTRAMUSCULAR | Status: DC | PRN
Start: 2015-07-07 — End: 2015-07-07
  Administered 2015-07-07: 30 mL

## 2015-07-07 SURGICAL SUPPLY — 25 items
BANDAGE ACE 4X5 VEL STRL LF (GAUZE/BANDAGES/DRESSINGS) ×2 IMPLANT
BANDAGE ELASTIC 4 LF NS (GAUZE/BANDAGES/DRESSINGS) ×2 IMPLANT
BLADE FULL RADIUS 3.5 (BLADE) IMPLANT
BLADE INCISOR PLUS 4.5 (BLADE) IMPLANT
BLADE SHAVER 4.5 DBL SERAT CV (CUTTER) IMPLANT
BLADE SHAVER 4.5X7 STR FR (MISCELLANEOUS) IMPLANT
CHLORAPREP W/TINT 26ML (MISCELLANEOUS) ×2 IMPLANT
CUTTER AGGRESSIVE+ 3.5 (CUTTER) ×2 IMPLANT
GAUZE SPONGE 4X4 12PLY STRL (GAUZE/BANDAGES/DRESSINGS) ×2 IMPLANT
GLOVE SURG ORTHO 9.0 STRL STRW (GLOVE) ×2 IMPLANT
GOWN STRL REUS W/ TWL LRG LVL3 (GOWN DISPOSABLE) ×1 IMPLANT
GOWN STRL REUS W/TWL LRG LVL3 (GOWN DISPOSABLE) ×1
GOWN SURG XXL (GOWNS) ×2 IMPLANT
IV LACTATED RINGER IRRG 3000ML (IV SOLUTION) ×4
IV LR IRRIG 3000ML ARTHROMATIC (IV SOLUTION) ×4 IMPLANT
KIT RM TURNOVER STRD PROC AR (KITS) ×2 IMPLANT
MANIFOLD NEPTUNE II (INSTRUMENTS) ×2 IMPLANT
PACK ARTHROSCOPY KNEE (MISCELLANEOUS) ×2 IMPLANT
SET TUBE SUCT SHAVER OUTFL 24K (TUBING) ×2 IMPLANT
SET TUBE TIP INTRA-ARTICULAR (MISCELLANEOUS) ×2 IMPLANT
SUT ETHILON 4-0 (SUTURE) ×1
SUT ETHILON 4-0 FS2 18XMFL BLK (SUTURE) ×1
SUTURE ETHLN 4-0 FS2 18XMF BLK (SUTURE) ×1 IMPLANT
TUBING ARTHRO INFLOW-ONLY STRL (TUBING) ×2 IMPLANT
WAND HAND CNTRL MULTIVAC 50 (MISCELLANEOUS) ×2 IMPLANT

## 2015-07-07 NOTE — Discharge Instructions (Addendum)
Keep bandage clean and dry. Weightbearing as tolerated on right leg. Aspirin 1 a day 81 mg until walking normally. Pain medicine as directed.   Weight bearing as tolerated to your operative leg using walker. Ice pack x 48 hrs, longer as desired. Keep operative leg elevated 2-3 pillows  AMBULATORY SURGERY  DISCHARGE INSTRUCTIONS   1) The drugs that you were given will stay in your system until tomorrow so for the next 24 hours you should not:  A) Drive an automobile B) Make any legal decisions C) Drink any alcoholic beverage   2) You may resume regular meals tomorrow.  Today it is better to start with liquids and gradually work up to solid foods.  You may eat anything you prefer, but it is better to start with liquids, then soup and crackers, and gradually work up to solid foods.   3) Please notify your doctor immediately if you have any unusual bleeding, trouble breathing, redness and pain at the surgery site, drainage, fever, or pain not relieved by medication.    4) Additional Instructions:        Please contact your physician with any problems or Same Day Surgery at [phone], Monday through Friday 6 am to 4 pm, or [place] [facility] number at [phone].

## 2015-07-07 NOTE — H&P (Signed)
Reviewed paper H+P, will be scanned into chart. No changes noted.  

## 2015-07-07 NOTE — Anesthesia Preprocedure Evaluation (Addendum)
Anesthesia Evaluation  Patient identified by MRN, date of birth, ID band Patient awake    Reviewed: Allergy & Precautions, H&P , NPO status , Patient's Chart, lab work & pertinent test results  History of Anesthesia Complications (+) PONV, Family history of anesthesia reaction and history of anesthetic complications  Airway Mallampati: III  TM Distance: >3 FB Neck ROM: limited    Dental  (+) Poor Dentition, Chipped, Caps   Pulmonary neg shortness of breath, asthma , sleep apnea , COPD, former smoker,    Pulmonary exam normal breath sounds clear to auscultation       Cardiovascular Exercise Tolerance: Good (-) angina(-) Past MI and (-) DOE Normal cardiovascular exam+ Valvular Problems/Murmurs MR  Rhythm:regular Rate:Normal     Neuro/Psych  Headaches,  Neuromuscular disease negative psych ROS   GI/Hepatic Neg liver ROS, hiatal hernia, GERD  Controlled and Medicated,  Endo/Other  diabetes, Type 2  Renal/GU negative Renal ROS  negative genitourinary   Musculoskeletal  (+) Arthritis ,   Abdominal   Peds  Hematology negative hematology ROS (+)   Anesthesia Other Findings Past Medical History:   Hypercholesterolemia                                         Bronchiectasis (HCC)                                           Comment:mild   Asthma                                                       GERD (gastroesophageal reflux disease)                         Comment:EGD 8/09- non bleeding erosive gastritis,               documented esophageal ulcerations.    Diverticulosis                                               Hiatal hernia                                                Osteoarthritis                                                 Comment:lumbar disc disease, left hip   Chronic headaches                                              Comment: followed by Headache Clinc migraines   Family history  of adverse reaction  to anesthes*                Comment:PONV   COPD (chronic obstructive pulmonary disease) (*              History of pneumonia                                         Diabetes mellitus without complication ([place])                   Comment:"doctor says I no longer have diabetes"     Sleep apnea                                                    Comment:cpap asked to bring mask and tubing   Weakness of right side of body                               Gall stones                                                    Comment:history of   Murmur                                                       PONV (postoperative nausea and vomiting)                       Comment:"Only with last hip and I believe it was due to              the morphine"   Past Surgical History:   APPENDECTOMY                                                  CHOLECYSTECTOMY                                               ABDOMINAL HYSTERECTOMY                           age 9       RIGHT OOPHORECTOMY                                            EXCISIONAL HEMORRHOIDECTOMY  LUMBAR LAMINECTOMY                                            EUS                                             N/A 05/31/2012      Comment:Procedure: UPPER ENDOSCOPIC ULTRASOUND (EUS)               LINEAR;  Surgeon: [name], MD;                Location: [facility];  Service: Endoscopy;                Laterality: N/A;   TOTAL HIP ARTHROPLASTY                          Left 05/01/2014     Comment:Dr. [name]   BLADDER SURGERY                                 N/A                Comment:with vaginal wall repair   CATARACT EXTRACTION W/ INTRAOCULAR LENS IMPLANT Bilateral 2015         TOTAL HIP ARTHROPLASTY                          Right 08/05/2014      Comment:Procedure: TOTAL HIP ARTHROPLASTY ANTERIOR               APPROACH;  Surgeon: [name], MD;                Location: [facility] ORS;  Service: Orthopedics;                 Laterality: Right;   CARDIAC CATHETERIZATION                          2014         ANTERIOR CERVICAL DECOMP/DISCECTOMY FUSION      N/A 02/24/2015     Comment:Procedure: CERVICAL FOUR-FIVE, CERVICAL               FIVE-SIX, CERVICAL SIX-SEVEN ANTERIOR CERVICAL               DECOMPRESSION/DISCECTOMY FUSION ;  Surgeon:               [name], MD;  Location: [place] [facility];              Service: Neurosurgery;  Laterality: N/A;  C45               C56 C67 anterior cervical decompression with               fusion interbody prosthesis plating and               bonegraft   BACK SURGERY  Comment:4th lumbar fusion   BREAST SURGERY                                  Bilateral                Comment:cyst removed and reduction   REDUCTION MAMMAPLASTY                            1990         JOINT REPLACEMENT                               Bilateral             BMI    Body Mass Index   31.45 kg/m 2    Patient reports not problems with oxycodone, fentanyl or dilaudid.   Reproductive/Obstetrics negative OB ROS                            Anesthesia Physical Anesthesia Plan  ASA: III  Anesthesia Plan: General LMA   Post-op Pain Management:    Induction:   Airway Management Planned:   Additional Equipment:   Intra-op Plan:   Post-operative Plan:   Informed Consent: I have reviewed the patients History and Physical, chart, labs and discussed the procedure including the risks, benefits and alternatives for the proposed anesthesia with the patient or authorized representative who has indicated his/her understanding and acceptance.   Dental Advisory Given  Plan Discussed with: Anesthesiologist, CRNA and Surgeon  Anesthesia Plan Comments:         Anesthesia Quick Evaluation

## 2015-07-07 NOTE — Progress Notes (Signed)
Removed take aspirin daily from d/c instructions (marked off patient's printed sheet) rx given for [facility] - pt already has tramadol and oxycodone at home, advises she understands not to take but one at the time, advises "I either take oxycodone or tramadol".

## 2015-07-07 NOTE — Anesthesia Procedure Notes (Signed)
Procedure Name: LMA Insertion Performed by: Rolla Plate Pre-anesthesia Checklist: Patient identified, Patient being monitored, Timeout performed, Emergency Drugs available and Suction available Patient Re-evaluated:Patient Re-evaluated prior to inductionOxygen Delivery Method: Circle system utilized Preoxygenation: Pre-oxygenation with 100% oxygen Intubation Type: IV induction Ventilation: Mask ventilation without difficulty LMA: LMA inserted LMA Size: 3.5 Tube type: Oral Number of attempts: 1 Placement Confirmation: positive ETCO2 and breath sounds checked- equal and bilateral Tube secured with: Tape Dental Injury: Teeth and Oropharynx as per pre-operative assessment

## 2015-07-07 NOTE — Transfer of Care (Signed)
Immediate Anesthesia Transfer of Care Note  Patient: Jennifer Hodges  Procedure(s) Performed: Procedure(s): KNEE ARTHROSCOPY WITH LATERAL MENISECTOMY, PARTIAL SYNOVECTOMY (Right)  Patient Location: PACU  Anesthesia Type:General  Level of Consciousness: awake  Airway & Oxygen Therapy: Patient Spontanous Breathing and Patient connected to face mask oxygen  Post-op Assessment: Report given to RN  Post vital signs: Reviewed  Last Vitals:  Filed Vitals:   07/07/15 1314 07/07/15 1507  BP: 133/65 132/77  Pulse: 75 85  Temp: 36.8 C 36.6 C  Resp: 16 14    Complications: No apparent anesthesia complications

## 2015-07-07 NOTE — Op Note (Signed)
07/07/2015  3:03 PM  PATIENT:  Jennifer Hodges  70 y.o. female  PRE-OPERATIVE DIAGNOSIS:  LATERAL MENISCUS TEAR,OSTEOARTHRITIS  POST-OPERATIVE DIAGNOSIS:  LATERAL MENISCUS TEAR,OSTEOARTHRITIS synovitis  PROCEDURE:  Procedure(s): KNEE ARTHROSCOPY WITH LATERAL MENISECTOMY, PARTIAL SYNOVECTOMY (Right)  SURGEON: Laurene Footman, MD  ASSISTANTS: None  ANESTHESIA:   general  EBL:  Total I/O In: 600 [I.V.:600] Out: -   BLOOD ADMINISTERED:none  DRAINS: none   LOCAL MEDICATIONS USED:  MARCAINE     SPECIMEN:  No Specimen  DISPOSITION OF SPECIMEN:  N/A  COUNTS:  YES  TOURNIQUET:    IMPLANTS: None  DICTATION: .Dragon Dictation patient was brought the operating room and after adequate general anesthesia was obtained, the right leg was prepped and draped in the usual sterile fashion after placing a tourniquet arthroscopic leg holder. After patient identification and timeout procedure were completed, an inferior lateral portal was made. Initial inspection revealed significant suprapatellar synovitis with extensive synovitis on the entire area of the suprapatellar region. The patellofemoral joint showed significant degenerative changes with most of the central patella having exposed bone. Coming around medially and inferior medial portal was made and the medial compartment was relatively normal with just mild degenerative change meniscus intact. Centrally anterior cruciate ligament intact. Lateral there is extensive lateral meniscus tear essentially a bucket-handle tear of the anterior horn that was detached laterally and was flipped up towards the notch this was accompanied by a additional tear in the posterior horn of the meniscus meniscal Punch was used then a shaver followed by an ArthroCare wand to remove all the abnormal tear torn lateral meniscus getting back to a stable margin. There was exposed bone in both femoral and tibial condyles. The gutters were free of any loose bodies and after  thorough irrigation of the joint after completing the synovectomy with the motorized shaver all instrumentation was withdrawn. Wounds closed with simple interrupted 4-0 nylon. 20 cc half percent Sensorcaine with epinephrine infiltrated in the areas of portals for postop analgesia. Xeroform 4 x 4 web roll and Ace wrap applied  PLAN OF CARE: Discharge to home after PACU  PATIENT DISPOSITION:  PACU - hemodynamically stable.

## 2015-07-08 ENCOUNTER — Encounter: Payer: Self-pay | Admitting: Orthopedic Surgery

## 2015-07-08 NOTE — Anesthesia Postprocedure Evaluation (Signed)
Anesthesia Post Note  Patient: Jennifer Hodges  Procedure(s) Performed: Procedure(s) (LRB): KNEE ARTHROSCOPY WITH LATERAL MENISECTOMY, PARTIAL SYNOVECTOMY (Right)  Patient location during evaluation: PACU Anesthesia Type: General Level of consciousness: awake and alert Pain management: pain level controlled Vital Signs Assessment: post-procedure vital signs reviewed and stable Respiratory status: spontaneous breathing, nonlabored ventilation, respiratory function stable and patient connected to nasal cannula oxygen Cardiovascular status: blood pressure returned to baseline and stable Postop Assessment: no signs of nausea or vomiting Anesthetic complications: no    Last Vitals:  Filed Vitals:   07/07/15 1622 07/07/15 1646  BP: 130/62 136/62  Pulse: 76 84  Temp: 36.4 C   Resp: 16 16    Last Pain:  Filed Vitals:   07/08/15 0826  PainSc: 5                  [name]

## 2015-08-03 ENCOUNTER — Other Ambulatory Visit: Payer: Medicare Other

## 2015-08-05 ENCOUNTER — Ambulatory Visit: Payer: Medicare Other | Admitting: Internal Medicine

## 2015-08-07 ENCOUNTER — Other Ambulatory Visit: Payer: Self-pay | Admitting: Internal Medicine

## 2015-08-07 DIAGNOSIS — Z1231 Encounter for screening mammogram for malignant neoplasm of breast: Secondary | ICD-10-CM

## 2015-08-24 DIAGNOSIS — R0602 Shortness of breath: Secondary | ICD-10-CM | POA: Diagnosis not present

## 2015-08-24 DIAGNOSIS — I10 Essential (primary) hypertension: Secondary | ICD-10-CM | POA: Diagnosis not present

## 2015-08-24 DIAGNOSIS — R079 Chest pain, unspecified: Secondary | ICD-10-CM | POA: Diagnosis not present

## 2015-08-24 DIAGNOSIS — R42 Dizziness and giddiness: Secondary | ICD-10-CM | POA: Diagnosis not present

## 2015-08-31 DIAGNOSIS — J479 Bronchiectasis, uncomplicated: Secondary | ICD-10-CM | POA: Diagnosis not present

## 2015-08-31 DIAGNOSIS — J310 Chronic rhinitis: Secondary | ICD-10-CM | POA: Diagnosis not present

## 2015-08-31 DIAGNOSIS — G4733 Obstructive sleep apnea (adult) (pediatric): Secondary | ICD-10-CM | POA: Diagnosis not present

## 2015-09-06 DIAGNOSIS — J3489 Other specified disorders of nose and nasal sinuses: Secondary | ICD-10-CM | POA: Diagnosis not present

## 2015-09-21 DIAGNOSIS — R42 Dizziness and giddiness: Secondary | ICD-10-CM | POA: Diagnosis not present

## 2015-09-21 DIAGNOSIS — R0602 Shortness of breath: Secondary | ICD-10-CM | POA: Diagnosis not present

## 2015-09-21 DIAGNOSIS — I6523 Occlusion and stenosis of bilateral carotid arteries: Secondary | ICD-10-CM | POA: Diagnosis not present

## 2015-09-28 DIAGNOSIS — R7303 Prediabetes: Secondary | ICD-10-CM | POA: Diagnosis not present

## 2015-09-28 DIAGNOSIS — G4733 Obstructive sleep apnea (adult) (pediatric): Secondary | ICD-10-CM | POA: Diagnosis not present

## 2015-09-28 DIAGNOSIS — M47812 Spondylosis without myelopathy or radiculopathy, cervical region: Secondary | ICD-10-CM | POA: Diagnosis not present

## 2015-09-28 DIAGNOSIS — M150 Primary generalized (osteo)arthritis: Secondary | ICD-10-CM | POA: Insufficient documentation

## 2015-09-28 DIAGNOSIS — M8949 Other hypertrophic osteoarthropathy, multiple sites: Secondary | ICD-10-CM | POA: Insufficient documentation

## 2015-09-28 DIAGNOSIS — Z8639 Personal history of other endocrine, nutritional and metabolic disease: Secondary | ICD-10-CM | POA: Diagnosis not present

## 2015-09-28 DIAGNOSIS — M159 Polyosteoarthritis, unspecified: Secondary | ICD-10-CM | POA: Insufficient documentation

## 2015-09-28 DIAGNOSIS — Z9989 Dependence on other enabling machines and devices: Secondary | ICD-10-CM | POA: Diagnosis not present

## 2015-09-28 DIAGNOSIS — K219 Gastro-esophageal reflux disease without esophagitis: Secondary | ICD-10-CM | POA: Diagnosis not present

## 2015-09-28 DIAGNOSIS — M47816 Spondylosis without myelopathy or radiculopathy, lumbar region: Secondary | ICD-10-CM | POA: Diagnosis not present

## 2015-10-29 DIAGNOSIS — R339 Retention of urine, unspecified: Secondary | ICD-10-CM | POA: Diagnosis not present

## 2015-10-29 DIAGNOSIS — N3946 Mixed incontinence: Secondary | ICD-10-CM | POA: Diagnosis not present

## 2015-10-29 DIAGNOSIS — N993 Prolapse of vaginal vault after hysterectomy: Secondary | ICD-10-CM | POA: Diagnosis not present

## 2015-11-19 DIAGNOSIS — J340 Abscess, furuncle and carbuncle of nose: Secondary | ICD-10-CM | POA: Diagnosis not present

## 2015-11-24 DIAGNOSIS — J340 Abscess, furuncle and carbuncle of nose: Secondary | ICD-10-CM | POA: Diagnosis not present

## 2015-11-24 DIAGNOSIS — J3489 Other specified disorders of nose and nasal sinuses: Secondary | ICD-10-CM | POA: Diagnosis not present

## 2015-11-24 DIAGNOSIS — G4733 Obstructive sleep apnea (adult) (pediatric): Secondary | ICD-10-CM | POA: Diagnosis not present

## 2015-11-24 DIAGNOSIS — M26609 Unspecified temporomandibular joint disorder, unspecified side: Secondary | ICD-10-CM | POA: Diagnosis not present

## 2015-11-25 ENCOUNTER — Ambulatory Visit: Payer: Medicare Other

## 2015-12-14 DIAGNOSIS — Z6831 Body mass index (BMI) 31.0-31.9, adult: Secondary | ICD-10-CM | POA: Diagnosis not present

## 2015-12-14 DIAGNOSIS — M4712 Other spondylosis with myelopathy, cervical region: Secondary | ICD-10-CM | POA: Diagnosis not present

## 2015-12-16 ENCOUNTER — Other Ambulatory Visit: Payer: Self-pay | Admitting: Internal Medicine

## 2015-12-16 ENCOUNTER — Ambulatory Visit
Admission: RE | Admit: 2015-12-16 | Discharge: 2015-12-16 | Disposition: A | Discharge status: Home / Self Care | Payer: Medicare Other | Source: Ambulatory Visit | Attending: Internal Medicine | Admitting: Internal Medicine

## 2015-12-16 DIAGNOSIS — Z1231 Encounter for screening mammogram for malignant neoplasm of breast: Secondary | ICD-10-CM | POA: Diagnosis not present

## 2015-12-22 DIAGNOSIS — K219 Gastro-esophageal reflux disease without esophagitis: Secondary | ICD-10-CM | POA: Diagnosis not present

## 2015-12-22 DIAGNOSIS — Z8639 Personal history of other endocrine, nutritional and metabolic disease: Secondary | ICD-10-CM | POA: Diagnosis not present

## 2015-12-22 DIAGNOSIS — R7303 Prediabetes: Secondary | ICD-10-CM | POA: Diagnosis not present

## 2015-12-25 DIAGNOSIS — G5601 Carpal tunnel syndrome, right upper limb: Secondary | ICD-10-CM | POA: Diagnosis not present

## 2015-12-25 DIAGNOSIS — G5602 Carpal tunnel syndrome, left upper limb: Secondary | ICD-10-CM | POA: Diagnosis not present

## 2015-12-29 DIAGNOSIS — I6523 Occlusion and stenosis of bilateral carotid arteries: Secondary | ICD-10-CM | POA: Diagnosis not present

## 2015-12-29 DIAGNOSIS — M25511 Pain in right shoulder: Secondary | ICD-10-CM | POA: Diagnosis not present

## 2015-12-29 DIAGNOSIS — G8929 Other chronic pain: Secondary | ICD-10-CM | POA: Diagnosis not present

## 2015-12-29 DIAGNOSIS — K219 Gastro-esophageal reflux disease without esophagitis: Secondary | ICD-10-CM | POA: Diagnosis not present

## 2015-12-29 DIAGNOSIS — E7800 Pure hypercholesterolemia, unspecified: Secondary | ICD-10-CM | POA: Diagnosis not present

## 2016-01-03 DIAGNOSIS — H04123 Dry eye syndrome of bilateral lacrimal glands: Secondary | ICD-10-CM | POA: Diagnosis not present

## 2016-01-03 DIAGNOSIS — K121 Other forms of stomatitis: Secondary | ICD-10-CM | POA: Diagnosis not present

## 2016-01-05 DIAGNOSIS — H10503 Unspecified blepharoconjunctivitis, bilateral: Secondary | ICD-10-CM | POA: Diagnosis not present

## 2016-01-13 DIAGNOSIS — G5601 Carpal tunnel syndrome, right upper limb: Secondary | ICD-10-CM | POA: Diagnosis not present

## 2016-01-25 DIAGNOSIS — G5601 Carpal tunnel syndrome, right upper limb: Secondary | ICD-10-CM | POA: Diagnosis not present

## 2016-01-25 DIAGNOSIS — G5621 Lesion of ulnar nerve, right upper limb: Secondary | ICD-10-CM | POA: Diagnosis not present

## 2016-02-02 ENCOUNTER — Encounter
Admission: RE | Admit: 2016-02-02 | Discharge: 2016-02-02 | Disposition: A | Discharge status: Home / Self Care | Payer: Medicare Other | Source: Ambulatory Visit | Attending: Orthopedic Surgery | Admitting: Orthopedic Surgery

## 2016-02-02 DIAGNOSIS — Z01818 Encounter for other preprocedural examination: Secondary | ICD-10-CM | POA: Diagnosis not present

## 2016-02-02 DIAGNOSIS — G5601 Carpal tunnel syndrome, right upper limb: Secondary | ICD-10-CM | POA: Diagnosis not present

## 2016-02-02 DIAGNOSIS — G5602 Carpal tunnel syndrome, left upper limb: Secondary | ICD-10-CM | POA: Insufficient documentation

## 2016-02-02 HISTORY — DX: Other intervertebral disc degeneration, lumbar region without mention of lumbar back pain or lower extremity pain: M51.369

## 2016-02-02 HISTORY — DX: Irritable bowel syndrome, unspecified: K58.9

## 2016-02-02 HISTORY — DX: Other intervertebral disc degeneration, lumbar region: M51.36

## 2016-02-02 HISTORY — DX: Anemia, unspecified: D64.9

## 2016-02-02 LAB — CBC
HCT: 38.3 % (ref 35.0–47.0)
Hemoglobin: 13 g/dL (ref 12.0–16.0)
MCH: 30.9 pg (ref 26.0–34.0)
MCHC: 33.8 g/dL (ref 32.0–36.0)
MCV: 91.3 fL (ref 80.0–100.0)
Platelets: 214 10*3/uL (ref 150–440)
RBC: 4.2 MIL/uL (ref 3.80–5.20)
RDW: 13.6 % (ref 11.5–14.5)
WBC: 5.1 10*3/uL (ref 3.6–11.0)

## 2016-02-02 LAB — DIFFERENTIAL
Basophils Absolute: 0.1 10*3/uL (ref 0–0.1)
Basophils Relative: 1 %
Eosinophils Absolute: 0.1 10*3/uL (ref 0–0.7)
Eosinophils Relative: 3 %
Lymphocytes Relative: 35 %
Lymphs Abs: 1.8 10*3/uL (ref 1.0–3.6)
Monocytes Absolute: 0.3 10*3/uL (ref 0.2–0.9)
Monocytes Relative: 6 %
Neutro Abs: 2.8 10*3/uL (ref 1.4–6.5)
Neutrophils Relative %: 55 %

## 2016-02-02 NOTE — Patient Instructions (Addendum)
  Your procedure is scheduled on: February 11, 2016 (Thursday) Report to Same Day Surgery 2nd floor [facility] To find out your arrival time please call [phone] between 1PM - 3PM on February 10, 2016 (Wednesday)  Remember: Instructions that are not followed completely may result in serious medical risk, up to and including death, or upon the discretion of your surgeon and anesthesiologist your surgery may need to be rescheduled.    _x___ 1. Do not eat food or drink liquids after midnight. No gum chewing or hard candies.     __x__ 2. No Alcohol for 24 hours before or after surgery.   __x__3. No Smoking for 24 prior to surgery.   ____  4. Bring all medications with you on the day of surgery if instructed.    __x__ 5. Notify your doctor if there is any change in your medical condition     (cold, fever, infections).     Do not wear jewelry, make-up, hairpins, clips or nail polish.  Do not wear lotions, powders, or perfumes. You may wear deodorant.  Do not shave 48 hours prior to surgery. Men may shave face and neck.  Do not bring valuables to the hospital.    [facility] is not responsible for any belongings or valuables.               Contacts, dentures or bridgework may not be worn into surgery.  Leave your suitcase in the car. After surgery it may be brought to your room.  For patients admitted to the hospital, discharge time is determined by your treatment team.   Patients discharged the day of surgery will not be allowed to drive home.    Please read over the following fact sheets that you were given:   [facility] Preparing for Surgery and or MRSA Information   _x___ Take these medicines the morning of surgery with A SIP OF WATER:    1. Pantoprazole  2.  3.  4.  5.  6.  ____Fleets enema or Magnesium Citrate as directed.   _x___ Use CHG Soap or sage wipes as directed on instruction sheet   ____ Use inhalers on the day of surgery and bring to hospital day of  surgery  ____ Stop metformin 2 days prior to surgery    ____ Take 1/2 of usual insulin dose the night before surgery and none on the morning of surgery           __x__ Stop aspirin or coumadin, or plavix (NO ASPIRIN)  x__ Stop Anti-inflammatories such as Advil, Aleve, Ibuprofen, Motrin, Naproxen,          Naprosyn, Goodies powders or aspirin products. Ok to take Tylenol. (Stop Meloxicam now)   _x___ Stop supplements until after surgery.  (Stop Vit E, Vit B-6, Vit S-13, Garlic, Olive Leaf Extract, Omega-3, CO Q 10, and Chromium  Picolinate now)  _x___ Bring C-Pap to the hospital.

## 2016-02-02 NOTE — Pre-Procedure Instructions (Signed)
  Component Name Value Ref Range  Vent Rate (bpm) 82   PR Interval (msec) 128   QRS Interval (msec) 76   QT Interval (msec) 390   QTc (msec) 455   Result Narrative  Normal sinus rhythm Nonspecific T wave abnormalities Abnormal ECG No previous ECGs available I reviewed and concur with this report. Electronically signed DY:NXGZ MD, KEN [phone]) on 08/25/2015 9:27:24 PM  Status Results Details    Office Visit on 08/24/2015 [place]")' [email or web address]/">Encounter Summary

## 2016-02-10 MED ORDER — CEFAZOLIN SODIUM-DEXTROSE 2-4 GM/100ML-% IV SOLN
2.0000 g | Freq: Once | INTRAVENOUS | Status: AC
  Administered 2016-02-11: 2 g via INTRAVENOUS

## 2016-02-11 ENCOUNTER — Ambulatory Visit: Payer: Medicare Other | Admitting: Anesthesiology

## 2016-02-11 ENCOUNTER — Encounter: Payer: Self-pay | Admitting: *Deleted

## 2016-02-11 ENCOUNTER — Encounter
Admission: RE | Disposition: A | Discharge status: Home / Self Care | Payer: Self-pay | Source: Ambulatory Visit | Attending: Orthopedic Surgery

## 2016-02-11 ENCOUNTER — Ambulatory Visit
Admission: RE | Admit: 2016-02-11 | Discharge: 2016-02-11 | Disposition: A | Discharge status: Home / Self Care | Payer: Medicare Other | Source: Ambulatory Visit | Attending: Orthopedic Surgery | Admitting: Orthopedic Surgery

## 2016-02-11 DIAGNOSIS — I10 Essential (primary) hypertension: Secondary | ICD-10-CM | POA: Insufficient documentation

## 2016-02-11 DIAGNOSIS — Z79890 Hormone replacement therapy (postmenopausal): Secondary | ICD-10-CM | POA: Diagnosis not present

## 2016-02-11 DIAGNOSIS — G5621 Lesion of ulnar nerve, right upper limb: Secondary | ICD-10-CM | POA: Diagnosis not present

## 2016-02-11 DIAGNOSIS — E559 Vitamin D deficiency, unspecified: Secondary | ICD-10-CM | POA: Diagnosis not present

## 2016-02-11 DIAGNOSIS — E7800 Pure hypercholesterolemia, unspecified: Secondary | ICD-10-CM | POA: Insufficient documentation

## 2016-02-11 DIAGNOSIS — Z6832 Body mass index (BMI) 32.0-32.9, adult: Secondary | ICD-10-CM | POA: Diagnosis not present

## 2016-02-11 DIAGNOSIS — K219 Gastro-esophageal reflux disease without esophagitis: Secondary | ICD-10-CM | POA: Diagnosis not present

## 2016-02-11 DIAGNOSIS — Z79899 Other long term (current) drug therapy: Secondary | ICD-10-CM | POA: Diagnosis not present

## 2016-02-11 DIAGNOSIS — D649 Anemia, unspecified: Secondary | ICD-10-CM | POA: Diagnosis not present

## 2016-02-11 DIAGNOSIS — G5601 Carpal tunnel syndrome, right upper limb: Secondary | ICD-10-CM | POA: Diagnosis not present

## 2016-02-11 DIAGNOSIS — Z791 Long term (current) use of non-steroidal anti-inflammatories (NSAID): Secondary | ICD-10-CM | POA: Insufficient documentation

## 2016-02-11 DIAGNOSIS — E669 Obesity, unspecified: Secondary | ICD-10-CM | POA: Diagnosis not present

## 2016-02-11 HISTORY — PX: ULNAR NERVE TRANSPOSITION: SHX2595

## 2016-02-11 HISTORY — PX: CARPAL TUNNEL RELEASE: SHX101

## 2016-02-11 SURGERY — CARPAL TUNNEL RELEASE
Anesthesia: General | Site: Wrist | Laterality: Right | Wound class: Clean

## 2016-02-11 MED ORDER — PROMETHAZINE HCL 25 MG/ML IJ SOLN
6.2500 mg | INTRAMUSCULAR | Status: DC | PRN
Start: 2016-02-11 — End: 2016-02-11
  Administered 2016-02-11: 12.5 mg via INTRAVENOUS

## 2016-02-11 MED ORDER — CEFAZOLIN SODIUM-DEXTROSE 2-4 GM/100ML-% IV SOLN
INTRAVENOUS | Status: AC
  Filled 2016-02-11: qty 100

## 2016-02-11 MED ORDER — DEXAMETHASONE SODIUM PHOSPHATE 10 MG/ML IJ SOLN
INTRAMUSCULAR | Status: DC | PRN
  Administered 2016-02-11: 4 mg via INTRAVENOUS

## 2016-02-11 MED ORDER — OXYCODONE HCL 5 MG PO TABS
ORAL_TABLET | ORAL | Status: AC
Start: 2016-02-11 — End: 2016-02-11
  Administered 2016-02-11: 5 mg via ORAL
  Filled 2016-02-11: qty 1

## 2016-02-11 MED ORDER — FENTANYL CITRATE (PF) 100 MCG/2ML IJ SOLN
INTRAMUSCULAR | Status: DC | PRN
  Administered 2016-02-11 (×2): 50 ug via INTRAVENOUS

## 2016-02-11 MED ORDER — OXYCODONE HCL 5 MG PO TABS: 5.0000 mg | ORAL_TABLET | ORAL | 0 refills | Status: DC | PRN

## 2016-02-11 MED ORDER — NEOMYCIN-POLYMYXIN B GU 40-200000 IR SOLN
Status: DC | PRN
  Administered 2016-02-11: 2 mL

## 2016-02-11 MED ORDER — KETAMINE HCL 50 MG/ML IJ SOLN
INTRAMUSCULAR | Status: DC | PRN
  Administered 2016-02-11: 25 mg via INTRAVENOUS

## 2016-02-11 MED ORDER — ONDANSETRON HCL 4 MG/2ML IJ SOLN
INTRAMUSCULAR | Status: DC | PRN
  Administered 2016-02-11: 4 mg via INTRAVENOUS

## 2016-02-11 MED ORDER — PROMETHAZINE HCL 25 MG/ML IJ SOLN
INTRAMUSCULAR | Status: AC
  Administered 2016-02-11: 12.5 mg via INTRAVENOUS
  Filled 2016-02-11: qty 1

## 2016-02-11 MED ORDER — LIDOCAINE HCL (PF) 2 % IJ SOLN
INTRAMUSCULAR | Status: DC | PRN
  Administered 2016-02-11: 50 mg

## 2016-02-11 MED ORDER — FENTANYL CITRATE (PF) 100 MCG/2ML IJ SOLN
25.0000 ug | INTRAMUSCULAR | Status: DC | PRN
  Administered 2016-02-11: 50 ug via INTRAVENOUS
  Administered 2016-02-11 (×2): 25 ug via INTRAVENOUS
  Administered 2016-02-11: 50 ug via INTRAVENOUS

## 2016-02-11 MED ORDER — MIDAZOLAM HCL 5 MG/5ML IJ SOLN
INTRAMUSCULAR | Status: DC | PRN
  Administered 2016-02-11: 1 mg via INTRAVENOUS

## 2016-02-11 MED ORDER — LACTATED RINGERS IV SOLN
INTRAVENOUS | Status: DC
  Administered 2016-02-11: 09:00:00 via INTRAVENOUS

## 2016-02-11 MED ORDER — OXYCODONE HCL 5 MG PO TABS
5.0000 mg | ORAL_TABLET | Freq: Once | ORAL | Status: AC | PRN
  Administered 2016-02-11: 5 mg via ORAL

## 2016-02-11 MED ORDER — PROPOFOL 10 MG/ML IV BOLUS
INTRAVENOUS | Status: DC | PRN
  Administered 2016-02-11: 130 mg via INTRAVENOUS

## 2016-02-11 MED ORDER — NEOMYCIN-POLYMYXIN B GU 40-200000 IR SOLN
Status: AC
  Filled 2016-02-11: qty 2

## 2016-02-11 MED ORDER — BUPIVACAINE HCL 0.5 % IJ SOLN
INTRAMUSCULAR | Status: DC | PRN
  Administered 2016-02-11: 10 mL

## 2016-02-11 MED ORDER — GLYCOPYRROLATE 0.2 MG/ML IJ SOLN
INTRAMUSCULAR | Status: DC | PRN
  Administered 2016-02-11: 0.2 mg via INTRAVENOUS

## 2016-02-11 MED ORDER — FENTANYL CITRATE (PF) 100 MCG/2ML IJ SOLN
INTRAMUSCULAR | Status: AC
  Administered 2016-02-11: 50 ug via INTRAVENOUS
  Filled 2016-02-11: qty 2

## 2016-02-11 MED ORDER — OXYCODONE HCL 5 MG/5ML PO SOLN: 5.0000 mg | Freq: Once | ORAL | Status: AC | PRN

## 2016-02-11 MED ORDER — MEPERIDINE HCL 25 MG/ML IJ SOLN: 6.2500 mg | INTRAMUSCULAR | Status: DC | PRN

## 2016-02-11 MED ORDER — BUPIVACAINE HCL (PF) 0.5 % IJ SOLN
INTRAMUSCULAR | Status: AC
  Filled 2016-02-11: qty 30

## 2016-02-11 MED ORDER — FENTANYL CITRATE (PF) 100 MCG/2ML IJ SOLN
INTRAMUSCULAR | Status: AC
  Administered 2016-02-11: 25 ug via INTRAVENOUS
  Filled 2016-02-11: qty 2

## 2016-02-11 MED ORDER — SODIUM CHLORIDE 0.9 % IJ SOLN
INTRAMUSCULAR | Status: AC
  Filled 2016-02-11: qty 10

## 2016-02-11 SURGICAL SUPPLY — 42 items
BANDAGE ACE 3X5.8 VEL STRL LF (GAUZE/BANDAGES/DRESSINGS) ×3 IMPLANT
BNDG COHESIVE 4X5 TAN STRL (GAUZE/BANDAGES/DRESSINGS) ×3 IMPLANT
BNDG ESMARK 4X12 TAN STRL LF (GAUZE/BANDAGES/DRESSINGS) ×3 IMPLANT
CANISTER SUCT 1200ML W/VALVE (MISCELLANEOUS) ×3 IMPLANT
CHLORAPREP W/TINT 26ML (MISCELLANEOUS) ×3 IMPLANT
CUFF TOURN 18 STER (MISCELLANEOUS) ×3 IMPLANT
CUFF TOURN 24 STER (MISCELLANEOUS) ×3 IMPLANT
ELECT CAUTERY BLADE 6.4 (BLADE) ×3 IMPLANT
ELECT CAUTERY NEEDLE 2.0 MIC (NEEDLE) IMPLANT
ELECT CAUTERY NEEDLE TIP 1.0 (MISCELLANEOUS)
ELECT REM PT RETURN 9FT ADLT (ELECTROSURGICAL) ×3
ELECTRODE CAUTERY NEDL TIP 1.0 (MISCELLANEOUS) IMPLANT
ELECTRODE REM PT RTRN 9FT ADLT (ELECTROSURGICAL) ×2 IMPLANT
GAUZE PETRO XEROFOAM 1X8 (MISCELLANEOUS) ×3 IMPLANT
GAUZE SPONGE 4X4 12PLY STRL (GAUZE/BANDAGES/DRESSINGS) ×3 IMPLANT
GLOVE BIOGEL PI IND STRL 9 (GLOVE) ×2 IMPLANT
GLOVE BIOGEL PI INDICATOR 9 (GLOVE) ×1
GLOVE SURG SYN 9.0  PF PI (GLOVE) ×1
GLOVE SURG SYN 9.0 PF PI (GLOVE) ×2 IMPLANT
GOWN SRG 2XL LVL 4 RGLN SLV (GOWNS) ×2 IMPLANT
GOWN STRL NON-REIN 2XL LVL4 (GOWNS) ×1
GOWN STRL REUS W/ TWL LRG LVL3 (GOWN DISPOSABLE) ×2 IMPLANT
GOWN STRL REUS W/TWL 2XL LVL3 (GOWN DISPOSABLE) ×3 IMPLANT
GOWN STRL REUS W/TWL LRG LVL3 (GOWN DISPOSABLE) ×1
KIT RM TURNOVER STRD PROC AR (KITS) ×3 IMPLANT
NEEDLE FILTER BLUNT 18X 1/2SAF (NEEDLE) ×1
NEEDLE FILTER BLUNT 18X1 1/2 (NEEDLE) ×2 IMPLANT
NS IRRIG 500ML POUR BTL (IV SOLUTION) ×3 IMPLANT
PACK EXTREMITY ARMC (MISCELLANEOUS) ×3 IMPLANT
PAD ABD DERMACEA PRESS 5X9 (GAUZE/BANDAGES/DRESSINGS) ×3 IMPLANT
PAD CAST CTTN 4X4 STRL (SOFTGOODS) ×4 IMPLANT
PADDING CAST COTTON 4X4 STRL (SOFTGOODS) ×2
SPONGE LAP 18X18 5 PK (GAUZE/BANDAGES/DRESSINGS) ×3 IMPLANT
STOCKINETTE IMPERVIOUS 9X36 MD (GAUZE/BANDAGES/DRESSINGS) ×3 IMPLANT
STOCKINETTE STRL 4IN 9604848 (GAUZE/BANDAGES/DRESSINGS) ×3 IMPLANT
STRIP CLOSURE SKIN 1/2X4 (GAUZE/BANDAGES/DRESSINGS) ×3 IMPLANT
SUT ETHILON 4-0 (SUTURE) ×1
SUT ETHILON 4-0 FS2 18XMFL BLK (SUTURE) ×2
SUT VIC AB 2-0 SH 27 (SUTURE) ×1
SUT VIC AB 2-0 SH 27XBRD (SUTURE) ×2 IMPLANT
SUT VIC AB 3-0 PS2 18 (SUTURE) ×3 IMPLANT
SUTURE ETHLN 4-0 FS2 18XMF BLK (SUTURE) ×2 IMPLANT

## 2016-02-11 NOTE — Anesthesia Preprocedure Evaluation (Signed)
Anesthesia Evaluation  Patient identified by MRN, date of birth, ID band Patient awake    Reviewed: Allergy & Precautions, NPO status , Patient's Chart, lab work & pertinent test results  History of Anesthesia Complications Negative for: history of anesthetic complications (severe nausea with morphine)  Airway Mallampati: II  TM Distance: >3 FB Neck ROM: Full    Dental  (+) Implants   Pulmonary asthma , sleep apnea and Continuous Positive Airway Pressure Ventilation , COPD,  COPD inhaler, former smoker,    breath sounds clear to auscultation- rhonchi (-) wheezing  (-) rales    Cardiovascular Exercise Tolerance: Good (-) hypertension(-) CAD and (-) Past MI  Rhythm:Regular Rate:Normal - Systolic murmurs and - Diastolic murmurs    Neuro/Psych  Headaches, negative psych ROS   GI/Hepatic Neg liver ROS, hiatal hernia, GERD  ,  Endo/Other  diabetes  Renal/GU negative Renal ROS     Musculoskeletal  (+) Arthritis ,   Abdominal (+) + obese,   Peds  Hematology  (+) anemia ,   Anesthesia Other Findings Past Medical History: No date: Anemia No date: Asthma     Comment: No Inhalers--Dr. [name] will order as needed No date: Bronchiectasis ([place])     Comment: mild No date: Chronic headaches     Comment:  followed by Headache Clinc migraines No date: COPD (chronic obstructive pulmonary disease) (* No date: DDD (degenerative disc disease), lumbar No date: Diabetes mellitus without complication ([place])     Comment: "doctor says I no longer have diabetes"   No date: Diverticulosis No date: Family history of adverse reaction to anesthes*     Comment: PONV No date: Gall stones     Comment: history of No date: GERD (gastroesophageal reflux disease)     Comment: EGD 8/09- non bleeding erosive gastritis,               documentd esophageal ulcerations.  No date: Hiatal hernia No date: History of pneumonia No date:  Hypercholesterolemia No date: IBS (irritable bowel syndrome) No date: Murmur No date: Osteoarthritis     Comment: lumbar disc disease, left hip No date: PONV (postoperative nausea and vomiting)     Comment: "Only with last hip and I believe it was due               to the morphine"  No date: Sleep apnea     Comment: cpap asked to bring mask and tubing No date: Weakness of right side of body   Reproductive/Obstetrics                             Anesthesia Physical Anesthesia Plan  ASA: III  Anesthesia Plan: General   Post-op Pain Management:    Induction: Intravenous  Airway Management Planned: LMA  Additional Equipment:   Intra-op Plan:   Post-operative Plan:   Informed Consent: I have reviewed the patients History and Physical, chart, labs and discussed the procedure including the risks, benefits and alternatives for the proposed anesthesia with the patient or authorized representative who has indicated his/her understanding and acceptance.   Dental advisory given  Plan Discussed with: Anesthesiologist and CRNA  Anesthesia Plan Comments:         Anesthesia Quick Evaluation

## 2016-02-11 NOTE — Anesthesia Procedure Notes (Signed)
Procedure Name: LMA Insertion Performed by: Rolla Plate Pre-anesthesia Checklist: Patient identified, Patient being monitored, Timeout performed, Emergency Drugs available and Suction available Patient Re-evaluated:Patient Re-evaluated prior to inductionOxygen Delivery Method: Circle system utilized Preoxygenation: Pre-oxygenation with 100% oxygen Intubation Type: IV induction Ventilation: Mask ventilation without difficulty LMA: LMA inserted LMA Size: 3.5 Tube type: Oral Number of attempts: 1 Placement Confirmation: positive ETCO2 and breath sounds checked- equal and bilateral Tube secured with: Tape Dental Injury: Teeth and Oropharynx as per pre-operative assessment

## 2016-02-11 NOTE — Discharge Instructions (Addendum)
Keep dressing clean and dry. Work fingers is much as possible without trying to move elbow pain medicine as directed  AMBULATORY SURGERY  DISCHARGE INSTRUCTIONS   1) The drugs that you were given will stay in your system until tomorrow so for the next 24 hours you should not:  A) Drive an automobile B) Make any legal decisions C) Drink any alcoholic beverage   2) You may resume regular meals tomorrow.  Today it is better to start with liquids and gradually work up to solid foods.  You may eat anything you prefer, but it is better to start with liquids, then soup and crackers, and gradually work up to solid foods.   3) Please notify your doctor immediately if you have any unusual bleeding, trouble breathing, redness and pain at the surgery site, drainage, fever, or pain not relieved by medication.    4) Additional Instructions:        Please contact your physician with any problems or Same Day Surgery at [phone], Monday through Friday 6 am to 4 pm, or [place] [facility] number at [phone].

## 2016-02-11 NOTE — Transfer of Care (Signed)
Immediate Anesthesia Transfer of Care Note  Patient: Jennifer Hodges  Procedure(s) Performed: Procedure(s): CARPAL TUNNEL RELEASE (Right) ULNAR NERVE DECOMPRESSION/TRANSPOSITION (Right)  Patient Location: PACU  Anesthesia Type:General  Level of Consciousness: awake  Airway & Oxygen Therapy: Patient Spontanous Breathing and Patient connected to face mask oxygen  Post-op Assessment: Report given to RN and Post -op Vital signs reviewed and stable  Post vital signs: Reviewed  Last Vitals:  Vitals:   02/11/16 0857 02/11/16 1114  BP: (!) 130/97 (!) 151/87  Pulse: 92 81  Resp: 16 15  Temp: 36.7 C 36.1 C    Last Pain:  Vitals:   02/11/16 0857  TempSrc: Oral         Complications: No apparent anesthesia complications

## 2016-02-11 NOTE — Anesthesia Postprocedure Evaluation (Signed)
Anesthesia Post Note  Patient: Jennifer Hodges  Procedure(s) Performed: Procedure(s) (LRB): CARPAL TUNNEL RELEASE (Right) ULNAR NERVE DECOMPRESSION/TRANSPOSITION (Right)  Patient location during evaluation: PACU Anesthesia Type: General Level of consciousness: awake and alert and oriented Pain management: pain level controlled Vital Signs Assessment: post-procedure vital signs reviewed and stable Respiratory status: spontaneous breathing, nonlabored ventilation and respiratory function stable Cardiovascular status: blood pressure returned to baseline and stable Postop Assessment: no signs of nausea or vomiting Anesthetic complications: no    Last Vitals:  Vitals:   02/11/16 1242 02/11/16 1300  BP: (!) 143/69 132/63  Pulse: 77 84  Resp: 16 16  Temp: 36.3 C     Last Pain:  Vitals:   02/11/16 1315  TempSrc:   PainSc: 5                  Zyree Traynham

## 2016-02-11 NOTE — Op Note (Signed)
02/11/2016  11:18 AM  PATIENT:  Jennifer Hodges  70 y.o. 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  POST-OPERATIVE DIAGNOSIS:  carpal tunnel syndrome of right wris right cubital tunnel  PROCEDURE:  Procedure(s): CARPAL TUNNEL RELEASE (Right) ULNAR NERVE DECOMPRESSION/TRANSPOSITION (Right) subcutaneous transposition  SURGEON: Laurene Footman, MD  ASSISTANTS: None  ANESTHESIA:   general  EBL:  Total I/O In: 500 [I.V.:500] Out: -   BLOOD ADMINISTERED:none  DRAINS: none   LOCAL MEDICATIONS USED:  MARCAINE     SPECIMEN:  No Specimen  DISPOSITION OF SPECIMEN:  N/A  COUNTS:  YES  TOURNIQUET:   47 minutes at 250 mms mercury  IMPLANTS: None  DICTATION: .Dragon Dictation patient brought the operating room and after adequate general anesthesia was obtained, the right arm was prepped and draped in sterile fashion. Appropriate patient identification and timeout procedures were completed and tourniquet was raised. Incision was made for the carpal tunnel first with an incision in line with ring metacarpal subcutaneous tissue spread and the transverse carpal ligament identified and opened. The underlying structures were protected with a hemostat releases carried out proximally and distally to the level of the wrist flexion crease. The median nerve appeared to be decompressed with good vascular blush but without any hourglass constriction present there is mild flexor tenosynovitis present. The wound was irrigated and then closed with simple interrupted 4-0 nylon skin sutures after infiltration of 10 cc half percent Sensorcaine without epinephrine into the subcutaneous tissue. Going to the elbow a Learmonth incision was made on the medial aspect of the elbow. The subcutaneous tissue was spread and the medial epicondyle identified and the ulnar nerve exposed. The nerve was exposed proximally to the level of the intermuscular septum and the septum  was released for subsequent transposition going distally the compression appeared to be just distal to the medial condyle where the nerve went into the FCU muscle is quite large at this level area and appeared to be the cause of compression. The fascia was released distally and a portion of the FCU muscular fascia was released to allow for transposition of the nerve after this was performed the nerve stayed anteriorly through a range of motion the subcutaneous tissues tissue had been elevated off the muscle and 3-0 Vicryl sutures used to hold the fascia to the subcutaneous layer with the nerve transposed anteriorly again checking through range of motion and there is no kinking or pressure at the level of the suture. The wound was then closed with simple interrupted 3-0 Vicryl subcutaneous and cutaneous sleeve followed by 4-0 nylon for the skin. Xeroform 4 x 4's web roll and Ace wrap and splints applied after letting down tourniquet.  PLAN OF CARE: Discharge to home after PACU  PATIENT DISPOSITION:  PACU - hemodynamically stable.

## 2016-02-11 NOTE — H&P (Signed)
Reviewed paper H+P, will be scanned into chart. No changes noted.  

## 2016-02-15 DIAGNOSIS — Z9889 Other specified postprocedural states: Secondary | ICD-10-CM | POA: Diagnosis not present

## 2016-02-29 DIAGNOSIS — Z9989 Dependence on other enabling machines and devices: Secondary | ICD-10-CM | POA: Diagnosis not present

## 2016-02-29 DIAGNOSIS — G4733 Obstructive sleep apnea (adult) (pediatric): Secondary | ICD-10-CM | POA: Diagnosis not present

## 2016-02-29 DIAGNOSIS — J479 Bronchiectasis, uncomplicated: Secondary | ICD-10-CM | POA: Diagnosis not present

## 2016-03-08 DIAGNOSIS — L03012 Cellulitis of left finger: Secondary | ICD-10-CM | POA: Diagnosis not present

## 2016-04-05 ENCOUNTER — Ambulatory Visit: Payer: Medicare Other

## 2016-04-20 DIAGNOSIS — M25561 Pain in right knee: Secondary | ICD-10-CM | POA: Diagnosis not present

## 2016-04-20 DIAGNOSIS — M1711 Unilateral primary osteoarthritis, right knee: Secondary | ICD-10-CM | POA: Diagnosis not present

## 2016-04-20 DIAGNOSIS — G8929 Other chronic pain: Secondary | ICD-10-CM | POA: Diagnosis not present

## 2016-04-27 DIAGNOSIS — Z0000 Encounter for general adult medical examination without abnormal findings: Secondary | ICD-10-CM | POA: Diagnosis not present

## 2016-04-27 DIAGNOSIS — M8589 Other specified disorders of bone density and structure, multiple sites: Secondary | ICD-10-CM | POA: Insufficient documentation

## 2016-05-03 DIAGNOSIS — M810 Age-related osteoporosis without current pathological fracture: Secondary | ICD-10-CM | POA: Diagnosis not present

## 2016-05-06 DIAGNOSIS — M5136 Other intervertebral disc degeneration, lumbar region: Secondary | ICD-10-CM | POA: Diagnosis not present

## 2016-05-06 DIAGNOSIS — M545 Low back pain: Secondary | ICD-10-CM | POA: Diagnosis not present

## 2016-05-09 ENCOUNTER — Other Ambulatory Visit: Payer: Self-pay | Admitting: Orthopedic Surgery

## 2016-05-09 DIAGNOSIS — M9933 Osseous stenosis of neural canal of lumbar region: Secondary | ICD-10-CM

## 2016-05-09 DIAGNOSIS — M1711 Unilateral primary osteoarthritis, right knee: Secondary | ICD-10-CM | POA: Diagnosis not present

## 2016-05-23 ENCOUNTER — Ambulatory Visit
Admission: RE | Admit: 2016-05-23 | Discharge: 2016-05-23 | Disposition: A | Discharge status: Home / Self Care | Payer: Medicare Other | Source: Ambulatory Visit | Attending: Orthopedic Surgery | Admitting: Orthopedic Surgery

## 2016-05-23 DIAGNOSIS — M9933 Osseous stenosis of neural canal of lumbar region: Secondary | ICD-10-CM | POA: Diagnosis present

## 2016-05-23 DIAGNOSIS — M47816 Spondylosis without myelopathy or radiculopathy, lumbar region: Secondary | ICD-10-CM | POA: Diagnosis not present

## 2016-05-23 DIAGNOSIS — M47817 Spondylosis without myelopathy or radiculopathy, lumbosacral region: Secondary | ICD-10-CM | POA: Insufficient documentation

## 2016-05-23 DIAGNOSIS — M5126 Other intervertebral disc displacement, lumbar region: Secondary | ICD-10-CM | POA: Diagnosis not present

## 2016-05-23 DIAGNOSIS — H04123 Dry eye syndrome of bilateral lacrimal glands: Secondary | ICD-10-CM | POA: Diagnosis not present

## 2016-06-08 DIAGNOSIS — M5136 Other intervertebral disc degeneration, lumbar region: Secondary | ICD-10-CM | POA: Diagnosis not present

## 2016-06-08 DIAGNOSIS — M6281 Muscle weakness (generalized): Secondary | ICD-10-CM | POA: Diagnosis not present

## 2016-06-14 DIAGNOSIS — M6281 Muscle weakness (generalized): Secondary | ICD-10-CM | POA: Diagnosis not present

## 2016-06-14 DIAGNOSIS — M5136 Other intervertebral disc degeneration, lumbar region: Secondary | ICD-10-CM | POA: Diagnosis not present

## 2016-06-21 DIAGNOSIS — M5136 Other intervertebral disc degeneration, lumbar region: Secondary | ICD-10-CM | POA: Diagnosis not present

## 2016-06-23 DIAGNOSIS — M25511 Pain in right shoulder: Secondary | ICD-10-CM | POA: Diagnosis not present

## 2016-06-27 ENCOUNTER — Other Ambulatory Visit: Payer: Self-pay | Admitting: Orthopedic Surgery

## 2016-06-27 DIAGNOSIS — M25511 Pain in right shoulder: Secondary | ICD-10-CM

## 2016-07-04 DIAGNOSIS — M1711 Unilateral primary osteoarthritis, right knee: Secondary | ICD-10-CM | POA: Diagnosis not present

## 2016-07-11 ENCOUNTER — Ambulatory Visit
Admission: RE | Admit: 2016-07-11 | Discharge: 2016-07-11 | Disposition: A | Discharge status: Home / Self Care | Payer: Medicare Other | Source: Ambulatory Visit | Attending: Orthopedic Surgery | Admitting: Orthopedic Surgery

## 2016-07-11 DIAGNOSIS — M25411 Effusion, right shoulder: Secondary | ICD-10-CM | POA: Diagnosis not present

## 2016-07-11 DIAGNOSIS — M75121 Complete rotator cuff tear or rupture of right shoulder, not specified as traumatic: Secondary | ICD-10-CM | POA: Insufficient documentation

## 2016-07-11 DIAGNOSIS — M25511 Pain in right shoulder: Secondary | ICD-10-CM

## 2016-07-11 DIAGNOSIS — M19011 Primary osteoarthritis, right shoulder: Secondary | ICD-10-CM | POA: Insufficient documentation

## 2016-07-18 DIAGNOSIS — E7800 Pure hypercholesterolemia, unspecified: Secondary | ICD-10-CM | POA: Diagnosis not present

## 2016-07-19 DIAGNOSIS — M5412 Radiculopathy, cervical region: Secondary | ICD-10-CM | POA: Diagnosis not present

## 2016-07-19 DIAGNOSIS — M75121 Complete rotator cuff tear or rupture of right shoulder, not specified as traumatic: Secondary | ICD-10-CM | POA: Diagnosis not present

## 2016-07-25 DIAGNOSIS — K219 Gastro-esophageal reflux disease without esophagitis: Secondary | ICD-10-CM | POA: Diagnosis not present

## 2016-07-25 DIAGNOSIS — M810 Age-related osteoporosis without current pathological fracture: Secondary | ICD-10-CM | POA: Diagnosis not present

## 2016-07-25 DIAGNOSIS — M150 Primary generalized (osteo)arthritis: Secondary | ICD-10-CM | POA: Diagnosis not present

## 2016-07-25 DIAGNOSIS — R61 Generalized hyperhidrosis: Secondary | ICD-10-CM | POA: Diagnosis not present

## 2016-08-07 DIAGNOSIS — M810 Age-related osteoporosis without current pathological fracture: Secondary | ICD-10-CM | POA: Insufficient documentation

## 2016-08-07 DIAGNOSIS — R61 Generalized hyperhidrosis: Secondary | ICD-10-CM | POA: Insufficient documentation

## 2016-08-12 DIAGNOSIS — B356 Tinea cruris: Secondary | ICD-10-CM | POA: Diagnosis not present

## 2016-08-29 DIAGNOSIS — G4733 Obstructive sleep apnea (adult) (pediatric): Secondary | ICD-10-CM | POA: Diagnosis not present

## 2016-09-09 DIAGNOSIS — I6523 Occlusion and stenosis of bilateral carotid arteries: Secondary | ICD-10-CM | POA: Diagnosis not present

## 2016-09-09 DIAGNOSIS — E7800 Pure hypercholesterolemia, unspecified: Secondary | ICD-10-CM | POA: Diagnosis not present

## 2016-09-09 DIAGNOSIS — I10 Essential (primary) hypertension: Secondary | ICD-10-CM | POA: Diagnosis not present

## 2016-10-06 DIAGNOSIS — N993 Prolapse of vaginal vault after hysterectomy: Secondary | ICD-10-CM | POA: Diagnosis not present

## 2016-10-06 DIAGNOSIS — R339 Retention of urine, unspecified: Secondary | ICD-10-CM | POA: Diagnosis not present

## 2016-10-06 DIAGNOSIS — N3946 Mixed incontinence: Secondary | ICD-10-CM | POA: Diagnosis not present

## 2016-10-24 DIAGNOSIS — M810 Age-related osteoporosis without current pathological fracture: Secondary | ICD-10-CM | POA: Diagnosis not present

## 2016-11-03 ENCOUNTER — Other Ambulatory Visit: Payer: Self-pay | Admitting: Internal Medicine

## 2016-11-03 DIAGNOSIS — J310 Chronic rhinitis: Secondary | ICD-10-CM | POA: Diagnosis not present

## 2016-11-03 DIAGNOSIS — R05 Cough: Secondary | ICD-10-CM | POA: Diagnosis not present

## 2016-11-03 DIAGNOSIS — J479 Bronchiectasis, uncomplicated: Secondary | ICD-10-CM | POA: Diagnosis not present

## 2016-11-03 DIAGNOSIS — G4733 Obstructive sleep apnea (adult) (pediatric): Secondary | ICD-10-CM | POA: Diagnosis not present

## 2016-11-03 DIAGNOSIS — Z1231 Encounter for screening mammogram for malignant neoplasm of breast: Secondary | ICD-10-CM

## 2016-12-05 DIAGNOSIS — M810 Age-related osteoporosis without current pathological fracture: Secondary | ICD-10-CM | POA: Diagnosis not present

## 2016-12-07 DIAGNOSIS — K5909 Other constipation: Secondary | ICD-10-CM | POA: Diagnosis not present

## 2016-12-07 DIAGNOSIS — N393 Stress incontinence (female) (male): Secondary | ICD-10-CM | POA: Diagnosis not present

## 2016-12-07 DIAGNOSIS — N8183 Incompetence or weakening of rectovaginal tissue: Secondary | ICD-10-CM | POA: Diagnosis not present

## 2016-12-07 DIAGNOSIS — R3915 Urgency of urination: Secondary | ICD-10-CM | POA: Diagnosis not present

## 2016-12-19 ENCOUNTER — Ambulatory Visit
Admission: RE | Admit: 2016-12-19 | Discharge: 2016-12-19 | Disposition: A | Discharge status: Home / Self Care | Payer: Medicare Other | Source: Ambulatory Visit | Attending: Internal Medicine | Admitting: Internal Medicine

## 2016-12-19 DIAGNOSIS — Z1231 Encounter for screening mammogram for malignant neoplasm of breast: Secondary | ICD-10-CM | POA: Diagnosis not present

## 2016-12-28 DIAGNOSIS — N3946 Mixed incontinence: Secondary | ICD-10-CM | POA: Diagnosis not present

## 2017-01-04 DIAGNOSIS — L821 Other seborrheic keratosis: Secondary | ICD-10-CM | POA: Diagnosis not present

## 2017-01-04 DIAGNOSIS — L820 Inflamed seborrheic keratosis: Secondary | ICD-10-CM | POA: Diagnosis not present

## 2017-01-04 DIAGNOSIS — X32XXXA Exposure to sunlight, initial encounter: Secondary | ICD-10-CM | POA: Diagnosis not present

## 2017-01-04 DIAGNOSIS — D485 Neoplasm of uncertain behavior of skin: Secondary | ICD-10-CM | POA: Diagnosis not present

## 2017-01-04 DIAGNOSIS — L570 Actinic keratosis: Secondary | ICD-10-CM | POA: Diagnosis not present

## 2017-09-25 ENCOUNTER — Other Ambulatory Visit: Payer: Self-pay | Admitting: Gastroenterology

## 2017-09-25 DIAGNOSIS — R1314 Dysphagia, pharyngoesophageal phase: Secondary | ICD-10-CM

## 2017-09-29 ENCOUNTER — Ambulatory Visit
Admission: RE | Admit: 2017-09-29 | Discharge: 2017-09-29 | Disposition: A | Discharge status: Home / Self Care | Payer: Medicare Other | Source: Ambulatory Visit | Attending: Gastroenterology | Admitting: Gastroenterology

## 2017-09-29 DIAGNOSIS — K219 Gastro-esophageal reflux disease without esophagitis: Secondary | ICD-10-CM | POA: Diagnosis not present

## 2017-09-29 DIAGNOSIS — R1314 Dysphagia, pharyngoesophageal phase: Secondary | ICD-10-CM

## 2017-11-14 ENCOUNTER — Other Ambulatory Visit: Payer: Self-pay | Admitting: Internal Medicine

## 2017-11-14 DIAGNOSIS — Z1231 Encounter for screening mammogram for malignant neoplasm of breast: Secondary | ICD-10-CM

## 2017-12-20 ENCOUNTER — Ambulatory Visit
Admission: RE | Admit: 2017-12-20 | Discharge: 2017-12-20 | Disposition: A | Discharge status: Home / Self Care | Payer: Medicare Other | Source: Ambulatory Visit | Attending: Internal Medicine | Admitting: Internal Medicine

## 2017-12-20 DIAGNOSIS — Z1231 Encounter for screening mammogram for malignant neoplasm of breast: Secondary | ICD-10-CM | POA: Insufficient documentation

## 2018-02-23 ENCOUNTER — Other Ambulatory Visit: Payer: Self-pay

## 2018-02-23 ENCOUNTER — Ambulatory Visit
Admission: EM | Admit: 2018-02-23 | Discharge: 2018-02-23 | Disposition: A | Discharge status: Home / Self Care | Payer: Medicare Other | Attending: Family Medicine | Admitting: Family Medicine

## 2018-02-23 DIAGNOSIS — J069 Acute upper respiratory infection, unspecified: Secondary | ICD-10-CM | POA: Diagnosis not present

## 2018-02-23 DIAGNOSIS — B9789 Other viral agents as the cause of diseases classified elsewhere: Secondary | ICD-10-CM

## 2018-02-23 DIAGNOSIS — R197 Diarrhea, unspecified: Secondary | ICD-10-CM

## 2018-02-23 DIAGNOSIS — B349 Viral infection, unspecified: Secondary | ICD-10-CM | POA: Insufficient documentation

## 2018-02-23 MED ORDER — IPRATROPIUM BROMIDE 0.06 % NA SOLN: 2.0000 | Freq: Four times a day (QID) | NASAL | 0 refills | Status: DC | PRN

## 2018-02-23 MED ORDER — BENZONATATE 100 MG PO CAPS: 100.0000 mg | ORAL_CAPSULE | Freq: Three times a day (TID) | ORAL | 0 refills | Status: DC | PRN

## 2018-02-23 NOTE — Discharge Instructions (Signed)
This is viral.  Your exam is normal.  Rest. Fluids.  Medication as prescribed.  Take care  Dr. Lacinda Axon

## 2018-02-23 NOTE — ED Provider Notes (Signed)
[facility]    CSN: [id number] Arrival date & time: 02/23/18  0865  History   Chief Complaint Chief Complaint  Patient presents with  . Cough   HPI  72 year old female with a complicated history presents with multiple complaints.  Patient reports that she began to not feel well on Saturday.  She states that she was at a party and was sneezed on.  States that she has had rhinorrhea and "low-grade fever".  She has not taken her temperature at home.  She states that she has felt "hot and cold".  Has had some congestion as well but none currently.  Her primary complaint is rhinorrhea.  She is used lozenges with improvement.  Additionally, patient states that she has developed diarrhea today.  Mild cough.  No known exacerbating factors.  No other associated symptoms.  No other complaints.  PMH, Surgical Hx, Family Hx, Social History reviewed and updated as below.  Past Medical History:  Diagnosis Date  . Anemia   . Asthma    No Inhalers--Dr. [name] will order as needed  . Bronchiectasis (HCC)    mild  . Chronic headaches     followed by [facility] migraines  . COPD (chronic obstructive pulmonary disease) ([place])   . DDD (degenerative disc disease), lumbar   . Diabetes mellitus without complication [facility])    "doctor says I no longer have diabetes"    . Diverticulosis   . Family history of adverse reaction to anesthesia    PONV  . Gall stones    history of  . GERD (gastroesophageal reflux disease)    EGD 8/09- non bleeding erosive gastritis, documentd esophageal ulcerations.   . Hiatal hernia   . History of pneumonia   . Hypercholesterolemia   . IBS (irritable bowel syndrome)   . Murmur   . Osteoarthritis    lumbar disc disease, left hip  . PONV (postoperative nausea and vomiting)    "Only with last hip and I believe it was due to the morphine"   . Sleep apnea    cpap asked to bring mask and tubing  . Weakness of right side of body     Patient Active  Problem List   Diagnosis Date Noted  . Knee pain 05/11/2015  . Chest tightness 03/24/2015  . Stool incontinence 03/24/2015  . Urinary frequency 03/04/2015  . Cervical spondylosis with radiculopathy 02/24/2015  . Pre-syncope 02/19/2015  . URI (upper respiratory infection) 02/15/2015  . Neck pain 12/25/2014  . Pelvic pain in female 11/16/2014  . Primary osteoarthritis of hip 08/05/2014  . Heel pain 06/01/2014  . Diarrhea 06/01/2014  . Health care maintenance 06/01/2014  . Right leg pain 03/30/2014  . Right arm pain 03/30/2014  . Sinusitis 03/30/2014  . Internal nasal lesion 11/12/2013  . Rib pain 08/04/2013  . Elevated AFP 04/28/2013  . Osteoporosis 11/12/2012  . Nonspecific (abnormal) findings on radiological and other examination of gastrointestinal tract 05/31/2012  . Bronchiectasis ([place]) 02/05/2012  . Sleep apnea 02/05/2012  . Degenerative disc disease, cervical 02/05/2012  . Hypercholesterolemia 02/05/2012  . GERD (gastroesophageal reflux disease) 02/05/2012  . Chronic headaches 02/05/2012    Past Surgical History:  Procedure Laterality Date  . ABDOMINAL HYSTERECTOMY  age 44  . ANTERIOR CERVICAL DECOMP/DISCECTOMY FUSION N/A 02/24/2015   Procedure: CERVICAL FOUR-FIVE, CERVICAL FIVE-SIX, CERVICAL SIX-SEVEN ANTERIOR CERVICAL DECOMPRESSION/DISCECTOMY FUSION ;  Surgeon: [name], MD;  Location: [place] [facility];  Service: Neurosurgery;  Laterality: N/A;  C45 C56 C67 anterior cervical  decompression with fusion interbody prosthesis plating and bonegraft  . APPENDECTOMY    . BACK SURGERY     4th lumbar fusion  . BLADDER SURGERY N/A    with vaginal wall repair  . BREAST CYST ASPIRATION Bilateral    neg  . BREAST SURGERY Bilateral    cyst removed and reduction  . CARDIAC CATHETERIZATION  2014  . CARPAL TUNNEL RELEASE Right 02/11/2016   Procedure: CARPAL TUNNEL RELEASE;  Surgeon: [name], MD;  Location: [facility] ORS;  Service: Orthopedics;  Laterality: Right;  . CATARACT  EXTRACTION W/ INTRAOCULAR LENS IMPLANT Bilateral 2015  . CHOLECYSTECTOMY    . EUS N/A 05/31/2012   Procedure: UPPER ENDOSCOPIC ULTRASOUND (EUS) LINEAR;  Surgeon: [name], MD;  Location: [facility];  Service: Endoscopy;  Laterality: N/A;  . EXCISIONAL HEMORRHOIDECTOMY    . JOINT REPLACEMENT Bilateral   . KNEE ARTHROSCOPY WITH LATERAL MENISECTOMY Right 07/07/2015   Procedure: KNEE ARTHROSCOPY WITH LATERAL MENISECTOMY, PARTIAL SYNOVECTOMY;  Surgeon: [name], MD;  Location: [facility] ORS;  Service: Orthopedics;  Laterality: Right;  . LUMBAR LAMINECTOMY    . REDUCTION MAMMAPLASTY  1990  . RIGHT OOPHORECTOMY    . TOTAL HIP ARTHROPLASTY Left 05/01/2014   Dr. [name]  . TOTAL HIP ARTHROPLASTY Right 08/05/2014   Procedure: TOTAL HIP ARTHROPLASTY ANTERIOR APPROACH;  Surgeon: [name], MD;  Location: [facility] ORS;  Service: Orthopedics;  Laterality: Right;  . ULNAR NERVE TRANSPOSITION Right 02/11/2016   Procedure: ULNAR NERVE DECOMPRESSION/TRANSPOSITION;  Surgeon: [name], MD;  Location: [facility] ORS;  Service: Orthopedics;  Laterality: Right;    OB History   No obstetric history on file.      Home Medications    Prior to Admission medications   Medication Sig Start Date End Date Taking? Authorizing Provider  acetaminophen (TYLENOL) 500 MG tablet Take 500 mg by mouth every 6 (six) hours as needed for mild pain or moderate pain. Reported on 04/06/2015   Yes [provider]  Chromium Picolinate 200 MCG CAPS Take 1 capsule by mouth daily.    Yes [provider]  Coenzyme Q10 (CO Q 10) 100 MG CAPS Take 100 mg by mouth every morning.    Yes [provider]  cyclobenzaprine (FLEXERIL) 10 MG tablet Take 10 mg by mouth at bedtime.    Yes [provider]  estradiol (ESTRACE) 0.1 MG/GM vaginal cream Place 1 Applicatorful vaginally 2 (two) times a week.    Yes [provider]  folic acid (FOLVITE) 263 MCG tablet Take 400 mcg by mouth daily.   Yes  [provider]  Garlic 7858 MG TBEC Take 2,000 mg by mouth daily.   Yes [provider]  Magnesium 250 MG TABS Take 1 tablet by mouth 2 (two) times daily.   Yes [provider]  Olive Leaf Extract 250 MG CAPS Take 2 capsules by mouth daily.   Yes [provider]  pantoprazole (PROTONIX) 40 MG tablet Take 1 tablet (40 mg total) by mouth 2 (two) times daily before a meal. 03/24/15  Yes Einar Pheasant, MD  Polyethyl Glycol-Propyl Glycol (SYSTANE OP) Apply 1 drop to eye at bedtime.    Yes [provider]  pyridOXINE (VITAMIN B-6) 100 MG tablet Take 100 mg by mouth daily.   Yes [provider]  vitamin B-12 (CYANOCOBALAMIN) 1000 MCG tablet Take 1,000 mcg by mouth daily.   Yes [provider]  vitamin E 400 UNIT capsule Take 400 Units by mouth daily.  Yes [provider]  benzonatate (TESSALON) 100 MG capsule Take 1 capsule (100 mg total) by mouth 3 (three) times daily as needed for cough. 02/23/18   Thersa Salt G, DO  ipratropium (ATROVENT) 0.06 % nasal spray Place 2 sprays into both nostrils 4 (four) times daily as needed for rhinitis. 02/23/18   Coral Spikes, DO    Family History Family History  Problem Relation Age of Onset  . Heart disease Mother        s/p stent  . Hypertension Mother   . Hypercholesterolemia Mother   . Diabetes Father   . Stomach cancer Other        uncle  . Breast cancer Cousin   . Breast cancer Paternal Aunt     Social History Social History   Tobacco Use  . Smoking status: Former Smoker    Last attempt to quit: 06/13/2007    Years since quitting: 10.7  . Smokeless tobacco: Never Used  Substance Use Topics  . Alcohol use: No    Alcohol/week: 0.0 standard drinks  . Drug use: No     Allergies   Aspirin; Morphine and related; Adhesive [tape]; Clarithromycin; Codeine; Darvon [propoxyphene hcl]; Demerol [meperidine]; Flonase [fluticasone propionate]; Simvastatin; and Talwin  [pentazocine]   Review of Systems Review of Systems  Constitutional: Positive for chills and fever.  HENT: Positive for rhinorrhea.   Gastrointestinal: Positive for diarrhea.   Physical Exam Triage Vital Signs ED Triage Vitals  Enc Vitals Group     BP 02/23/18 0958 136/76     Pulse Rate 02/23/18 0958 79     Resp 02/23/18 0958 18     Temp 02/23/18 0958 98.6 F (37 C)     Temp Source 02/23/18 0958 Oral     SpO2 02/23/18 0958 100 %     Weight 02/23/18 0954 170 lb (77.1 kg)     Height 02/23/18 0954 5\' 1"  (1.549 m)     Head Circumference --      Peak Flow --      Pain Score 02/23/18 0954 7     Pain Loc --      Pain Edu? --      Excl. in [place]? --    No data found.  Updated Vital Signs BP 136/76 (BP Location: Left Arm)   Pulse 79   Temp 98.6 F (37 C) (Oral)   Resp 18   Ht 5\' 1"  (1.549 m)   Wt 77.1 kg   SpO2 100%   BMI 32.12 kg/m   Visual Acuity Right Eye Distance:   Left Eye Distance:   Bilateral Distance:    Right Eye Near:   Left Eye Near:    Bilateral Near:     Physical Exam Vitals signs and nursing note reviewed.  Constitutional:      General: She is not in acute distress.    Appearance: Normal appearance. She is not ill-appearing.  HENT:     Head: Normocephalic and atraumatic.     Right Ear: Tympanic membrane normal.     Left Ear: Tympanic membrane normal.     Mouth/Throat:     Pharynx: Oropharynx is clear.  Eyes:     General: No scleral icterus.    Conjunctiva/sclera: Conjunctivae normal.  Cardiovascular:     Rate and Rhythm: Normal rate and regular rhythm.  Pulmonary:     Effort: Pulmonary effort is normal. No respiratory distress.     Breath sounds: No wheezing, rhonchi or rales.  Neurological:  Mental Status: She is alert.  Psychiatric:        Mood and Affect: Mood normal.        Behavior: Behavior normal.    UC Treatments / Results  Labs (all labs ordered are listed, but only abnormal results are displayed) Labs Reviewed - No data  to display  EKG None  Radiology No results found.  Procedures Procedures (including critical care time)  Medications Ordered in UC Medications - No data to display  Initial Impression / Assessment and Plan / UC Course  I have reviewed the triage vital signs and the nursing notes.  Pertinent labs & imaging results that were available during my care of the patient were reviewed by me and considered in my medical decision making (see chart for details).    72 year old female presents with viral URI.  Treating with Atrovent nasal spray and Tessalon Perles.  Final Clinical Impressions(s) / UC Diagnoses   Final diagnoses:  Viral illness     Discharge Instructions     This is viral.  Your exam is normal.  Rest. Fluids.  Medication as prescribed.  Take care  Dr. Lacinda Axon    ED Prescriptions    Medication Sig Dispense Auth. Provider   ipratropium (ATROVENT) 0.06 % nasal spray Place 2 sprays into both nostrils 4 (four) times daily as needed for rhinitis. 15 mL Leni Pankonin G, DO   benzonatate (TESSALON) 100 MG capsule Take 1 capsule (100 mg total) by mouth 3 (three) times daily as needed for cough. 30 capsule Coral Spikes, DO     Controlled Substance Prescriptions [place] Controlled Substance Registry consulted? Not Applicable   Coral Spikes, DO 02/23/18 1044

## 2018-02-23 NOTE — ED Triage Notes (Signed)
Patient complains of cough, congestion, diarrhea. Patient states that she has been having symptoms since Saturday when someone sneezed in her direction.

## 2018-02-28 ENCOUNTER — Ambulatory Visit
Admission: EM | Admit: 2018-02-28 | Discharge: 2018-02-28 | Disposition: A | Discharge status: Home / Self Care | Payer: Medicare Other | Attending: Family Medicine | Admitting: Family Medicine

## 2018-02-28 ENCOUNTER — Encounter: Payer: Self-pay | Admitting: Emergency Medicine

## 2018-02-28 ENCOUNTER — Other Ambulatory Visit: Payer: Self-pay

## 2018-02-28 DIAGNOSIS — H6502 Acute serous otitis media, left ear: Secondary | ICD-10-CM | POA: Insufficient documentation

## 2018-02-28 DIAGNOSIS — R42 Dizziness and giddiness: Secondary | ICD-10-CM

## 2018-02-28 DIAGNOSIS — H6122 Impacted cerumen, left ear: Secondary | ICD-10-CM | POA: Insufficient documentation

## 2018-02-28 MED ORDER — AMOXICILLIN 875 MG PO TABS: 875.0000 mg | ORAL_TABLET | Freq: Two times a day (BID) | ORAL | 0 refills | Status: DC

## 2018-02-28 NOTE — ED Triage Notes (Signed)
Pt c/o left ear pain and dizziness. Started about 3 days ago. Pt has had chills and sweats.

## 2018-02-28 NOTE — ED Provider Notes (Addendum)
[facility]    CSN: [id number] Arrival date & time: 02/28/18  1252     History   Chief Complaint Chief Complaint  Patient presents with  . Otalgia    left    HPI Jennifer Hodges is a 72 y.o. female.   72 yo female with a c/o left ear pain and intermittent dizziness for the past 3 days. Denies any fevers or drainage.   The history is provided by the patient.  Otalgia    Past Medical History:  Diagnosis Date  . Anemia   . Asthma    No Inhalers--Dr. [name] will order as needed  . Bronchiectasis (HCC)    mild  . Chronic headaches     followed by [facility] migraines  . COPD (chronic obstructive pulmonary disease) ([place])   . DDD (degenerative disc disease), lumbar   . Diabetes mellitus without complication [facility])    "doctor says I no longer have diabetes"    . Diverticulosis   . Family history of adverse reaction to anesthesia    PONV  . Gall stones    history of  . GERD (gastroesophageal reflux disease)    EGD 8/09- non bleeding erosive gastritis, documentd esophageal ulcerations.   . Hiatal hernia   . History of pneumonia   . Hypercholesterolemia   . IBS (irritable bowel syndrome)   . Murmur   . Osteoarthritis    lumbar disc disease, left hip  . PONV (postoperative nausea and vomiting)    "Only with last hip and I believe it was due to the morphine"   . Sleep apnea    cpap asked to bring mask and tubing  . Weakness of right side of body     Patient Active Problem List   Diagnosis Date Noted  . Knee pain 05/11/2015  . Chest tightness 03/24/2015  . Stool incontinence 03/24/2015  . Urinary frequency 03/04/2015  . Cervical spondylosis with radiculopathy 02/24/2015  . Pre-syncope 02/19/2015  . URI (upper respiratory infection) 02/15/2015  . Neck pain 12/25/2014  . Pelvic pain in female 11/16/2014  . Primary osteoarthritis of hip 08/05/2014  . Heel pain 06/01/2014  . Diarrhea 06/01/2014  . Health care maintenance 06/01/2014  . Right leg  pain 03/30/2014  . Right arm pain 03/30/2014  . Sinusitis 03/30/2014  . Internal nasal lesion 11/12/2013  . Rib pain 08/04/2013  . Elevated AFP 04/28/2013  . Osteoporosis 11/12/2012  . Nonspecific (abnormal) findings on radiological and other examination of gastrointestinal tract 05/31/2012  . Bronchiectasis ([place]) 02/05/2012  . Sleep apnea 02/05/2012  . Degenerative disc disease, cervical 02/05/2012  . Hypercholesterolemia 02/05/2012  . GERD (gastroesophageal reflux disease) 02/05/2012  . Chronic headaches 02/05/2012    Past Surgical History:  Procedure Laterality Date  . ABDOMINAL HYSTERECTOMY  age 66  . ANTERIOR CERVICAL DECOMP/DISCECTOMY FUSION N/A 02/24/2015   Procedure: CERVICAL FOUR-FIVE, CERVICAL FIVE-SIX, CERVICAL SIX-SEVEN ANTERIOR CERVICAL DECOMPRESSION/DISCECTOMY FUSION ;  Surgeon: [name], MD;  Location: [place] [facility];  Service: Neurosurgery;  Laterality: N/A;  C45 C56 C67 anterior cervical decompression with fusion interbody prosthesis plating and bonegraft  . APPENDECTOMY    . BACK SURGERY     4th lumbar fusion  . BLADDER SURGERY N/A    with vaginal wall repair  . BREAST CYST ASPIRATION Bilateral    neg  . BREAST SURGERY Bilateral    cyst removed and reduction  . CARDIAC CATHETERIZATION  2014  . CARPAL TUNNEL RELEASE Right 02/11/2016   Procedure: CARPAL  TUNNEL RELEASE;  Surgeon: [name], MD;  Location: [facility] ORS;  Service: Orthopedics;  Laterality: Right;  . CATARACT EXTRACTION W/ INTRAOCULAR LENS IMPLANT Bilateral 2015  . CHOLECYSTECTOMY    . EUS N/A 05/31/2012   Procedure: UPPER ENDOSCOPIC ULTRASOUND (EUS) LINEAR;  Surgeon: [name], MD;  Location: [facility];  Service: Endoscopy;  Laterality: N/A;  . EXCISIONAL HEMORRHOIDECTOMY    . JOINT REPLACEMENT Bilateral   . KNEE ARTHROSCOPY WITH LATERAL MENISECTOMY Right 07/07/2015   Procedure: KNEE ARTHROSCOPY WITH LATERAL MENISECTOMY, PARTIAL SYNOVECTOMY;  Surgeon: [name], MD;  Location: [facility]  ORS;  Service: Orthopedics;  Laterality: Right;  . LUMBAR LAMINECTOMY    . REDUCTION MAMMAPLASTY  1990  . RIGHT OOPHORECTOMY    . TOTAL HIP ARTHROPLASTY Left 05/01/2014   Dr. [name]  . TOTAL HIP ARTHROPLASTY Right 08/05/2014   Procedure: TOTAL HIP ARTHROPLASTY ANTERIOR APPROACH;  Surgeon: [name], MD;  Location: [facility] ORS;  Service: Orthopedics;  Laterality: Right;  . ULNAR NERVE TRANSPOSITION Right 02/11/2016   Procedure: ULNAR NERVE DECOMPRESSION/TRANSPOSITION;  Surgeon: [name], MD;  Location: [facility] ORS;  Service: Orthopedics;  Laterality: Right;    OB History   No obstetric history on file.      Home Medications    Prior to Admission medications   Medication Sig Start Date End Date Taking? Authorizing Provider  acetaminophen (TYLENOL) 500 MG tablet Take 500 mg by mouth every 6 (six) hours as needed for mild pain or moderate pain. Reported on 04/06/2015   Yes [provider]  Coenzyme Q10 (CO Q 10) 100 MG CAPS Take 100 mg by mouth every morning.    Yes [provider]  cyclobenzaprine (FLEXERIL) 10 MG tablet Take 10 mg by mouth at bedtime.    Yes [provider]  estradiol (ESTRACE) 0.1 MG/GM vaginal cream Place 1 Applicatorful vaginally 2 (two) times a week.    Yes [provider]  folic acid (FOLVITE) 301 MCG tablet Take 400 mcg by mouth daily.   Yes [provider]  Garlic 6010 MG TBEC Take 2,000 mg by mouth daily.   Yes [provider]  ipratropium (ATROVENT) 0.06 % nasal spray Place 2 sprays into both nostrils 4 (four) times daily as needed for rhinitis. 02/23/18  Yes Cook, Jayce G, DO  Magnesium 250 MG TABS Take 1 tablet by mouth 2 (two) times daily.   Yes [provider]  Olive Leaf Extract 250 MG CAPS Take 2 capsules by mouth daily.   Yes [provider]  pantoprazole (PROTONIX) 40 MG tablet Take 1 tablet (40 mg total) by mouth 2 (two) times daily before a meal. 03/24/15  Yes Einar Pheasant,  MD  Polyethyl Glycol-Propyl Glycol (SYSTANE OP) Apply 1 drop to eye at bedtime.    Yes [provider]  pyridOXINE (VITAMIN B-6) 100 MG tablet Take 100 mg by mouth daily.   Yes [provider]  vitamin B-12 (CYANOCOBALAMIN) 1000 MCG tablet Take 1,000 mcg by mouth daily.   Yes [provider]  vitamin E 400 UNIT capsule Take 400 Units by mouth daily.   Yes [provider]  amoxicillin (AMOXIL) 875 MG tablet Take 1 tablet (875 mg total) by mouth 2 (two) times daily. 02/28/18   Norval Gable, MD  benzonatate (TESSALON) 100 MG capsule Take 1 capsule (100 mg total) by mouth 3 (three) times daily as needed for cough. 02/23/18   Coral Spikes, DO  Chromium Picolinate 200 MCG CAPS Take 1 capsule  by mouth daily.     [provider]  oxycodone (OXY-IR) 5 MG capsule oxycodone 5 mg tablet  1 PO qday    [provider]    Family History Family History  Problem Relation Age of Onset  . Heart disease Mother        s/p stent  . Hypertension Mother   . Hypercholesterolemia Mother   . Diabetes Father   . Stomach cancer Other        uncle  . Breast cancer Cousin   . Breast cancer Paternal Aunt     Social History Social History   Tobacco Use  . Smoking status: Former Smoker    Last attempt to quit: 06/13/2007    Years since quitting: 10.7  . Smokeless tobacco: Never Used  Substance Use Topics  . Alcohol use: No    Alcohol/week: 0.0 standard drinks  . Drug use: No     Allergies   Aspirin; Morphine and related; Adhesive [tape]; Clarithromycin; Codeine; Darvon [propoxyphene hcl]; Demerol [meperidine]; Flonase [fluticasone propionate]; Simvastatin; and Talwin [pentazocine]   Review of Systems Review of Systems  HENT: Positive for ear pain.      Physical Exam Triage Vital Signs ED Triage Vitals [02/28/18 1302]  Enc Vitals Group     BP (!) 164/73     Pulse Rate 81     Resp 18     Temp 98.2 F (36.8 C)     Temp Source Oral      SpO2 98 %     Weight 170 lb (77.1 kg)     Height 5\' 1"  (1.549 m)     Head Circumference      Peak Flow      Pain Score 7     Pain Loc      Pain Edu?      Excl. in [place]?    No data found.  Updated Vital Signs BP (!) 164/73 (BP Location: Left Arm)   Pulse 81   Temp 98.2 F (36.8 C) (Oral)   Resp 18   Ht 5\' 1"  (1.549 m)   Wt 77.1 kg   SpO2 98%   BMI 32.12 kg/m   Visual Acuity Right Eye Distance:   Left Eye Distance:   Bilateral Distance:    Right Eye Near:   Left Eye Near:    Bilateral Near:     Physical Exam Vitals signs and nursing note reviewed.  Constitutional:      General: She is not in acute distress.    Appearance: Normal appearance. She is not ill-appearing, toxic-appearing or diaphoretic.  HENT:     Left Ear: A middle ear effusion is present. There is impacted cerumen. Tympanic membrane is injected and bulging.     Ears:     Comments: TM visualized after cerumen disimpaction Neurological:     Mental Status: She is alert.      UC Treatments / Results  Labs (all labs ordered are listed, but only abnormal results are displayed) Labs Reviewed - No data to display  EKG None  Radiology No results found.  Procedures Ear Cerumen Removal Date/Time: 02/28/2018 2:30 PM Performed by: Norval Gable, MD Authorized by: Norval Gable, MD   Consent:    Consent obtained:  Verbal   Consent given by:  Patient   Risks discussed:  Bleeding, infection, pain, TM perforation, incomplete removal and dizziness   Alternatives discussed:  No treatment Procedure details:    Location:  L ear  Procedure type: irrigation   Post-procedure details:    Inspection:  TM intact   Hearing quality:  Improved   Patient tolerance of procedure:  Tolerated well, no immediate complications   (including critical care time)  Medications Ordered in UC Medications - No data to display  Initial Impression / Assessment and Plan / UC Course  I have reviewed the triage vital  signs and the nursing notes.  Pertinent labs & imaging results that were available during my care of the patient were reviewed by me and considered in my medical decision making (see chart for details).      Final Clinical Impressions(s) / UC Diagnoses   Final diagnoses:  Acute serous otitis media of left ear, recurrence not specified  Impacted cerumen of left ear    ED Prescriptions    Medication Sig Dispense Auth. Provider   amoxicillin (AMOXIL) 875 MG tablet Take 1 tablet (875 mg total) by mouth 2 (two) times daily. 20 tablet Norval Gable, MD     1. diagnosis reviewed with patient 2. rx as per orders above; reviewed possible side effects, interactions, risks and benefits  3. Recommend supportive treatment with otc analgesics prn 4. Follow-up prn if symptoms worsen or don't improve   Controlled Substance Prescriptions [place] Controlled Substance Registry consulted? Not Applicable   Norval Gable, MD 02/28/18 [place], [place], MD 02/28/18 [phone]

## 2018-04-17 ENCOUNTER — Other Ambulatory Visit: Payer: Self-pay

## 2018-04-17 ENCOUNTER — Ambulatory Visit
Admission: EM | Admit: 2018-04-17 | Discharge: 2018-04-17 | Disposition: A | Discharge status: Home / Self Care | Payer: Medicare Other | Attending: Family Medicine | Admitting: Family Medicine

## 2018-04-17 ENCOUNTER — Encounter: Payer: Self-pay | Admitting: Emergency Medicine

## 2018-04-17 DIAGNOSIS — J00 Acute nasopharyngitis [common cold]: Secondary | ICD-10-CM

## 2018-04-17 MED ORDER — CETIRIZINE HCL 5 MG PO TABS: 5.0000 mg | ORAL_TABLET | Freq: Every day | ORAL | 0 refills | Status: DC

## 2018-04-17 MED ORDER — IPRATROPIUM BROMIDE 0.06 % NA SOLN: 2.0000 | Freq: Four times a day (QID) | NASAL | 0 refills | Status: DC | PRN

## 2018-04-17 NOTE — ED Provider Notes (Signed)
[facility]    CSN: [id number] Arrival date & time: 04/17/18  1217  History   Chief Complaint Chief Complaint  Patient presents with  . Cough  . Otalgia  . Sore Throat   HPI  73 year old female presents with the above complaints.  Patient reports that she has had runny nose for the past 2 weeks.  Associated ear pain.  She is had some chills and reported fever.  No documented fever here.  She also reports sore throat.  She has used Tylenol and salt water gargles without resolution.  No known exacerbating factors.  No other associated symptoms.  No other complaints.  PMH, Surgical Hx, Family Hx, Social History reviewed and updated as below.  Past Medical History:  Diagnosis Date  . Anemia   . Asthma    No Inhalers--Dr. [name] will order as needed  . Bronchiectasis (HCC)    mild  . Chronic headaches     followed by [facility] migra[facility]  . COPD (chronic obstructive pulmonary disease) ([place])   . DDD (degenerative disc disease), lumbar   . Diabetes mellitus without complication [facility])    "doctor says I no longer have diabetes"    . Diverticulosis   . Family history of adverse reaction to anesthesia    PONV  . Gall stones    history of  . GERD (gastroesophageal reflux disease)    EGD 8/09- non bleeding erosive gastritis, documentd esophageal ulcerations.   . Hiatal hernia   . History of pneumonia   . Hypercholesterolemia   . IBS (irritable bowel syndrome)   . Murmur   . Osteoarthritis    lumbar disc disease, left hip  . PONV (postoperative nausea and vomiting)    "Only with last hip and I believe it was due to the morphine"   . Sleep apnea    cpap asked to bring mask and tubing  . Weakness of right side of body     Patient Active Problem List   Diagnosis Date Noted  . Knee pain 05/11/2015  . Chest tightness 03/24/2015  . Stool incontinence 03/24/2015  . Urinary frequency 03/04/2015  . Cervical spondylosis with radiculopathy 02/24/2015  .  Pre-syncope 02/19/2015  . URI (upper respiratory infection) 02/15/2015  . Neck pain 12/25/2014  . Pelvic pain in female 11/16/2014  . Primary osteoarthritis of hip 08/05/2014  . Heel pain 06/01/2014  . Diarrhea 06/01/2014  . Health care maintenance 06/01/2014  . Right leg pain 03/30/2014  . Right arm pain 03/30/2014  . Sinusitis 03/30/2014  . Internal nasal lesion 11/12/2013  . Rib pain 08/04/2013  . Elevated AFP 04/28/2013  . Osteoporosis 11/12/2012  . Nonspecific (abnormal) findings on radiological and other examination of gastrointestinal tract 05/31/2012  . Bronchiectasis ([place]) 02/05/2012  . Sleep apnea 02/05/2012  . Degenerative disc disease, cervical 02/05/2012  . Hypercholesterolemia 02/05/2012  . GERD (gastroesophageal reflux disease) 02/05/2012  . Chronic headaches 02/05/2012    Past Surgical History:  Procedure Laterality Date  . ABDOMINAL HYSTERECTOMY  age 68  . ANTERIOR CERVICAL DECOMP/DISCECTOMY FUSION N/A 02/24/2015   Procedure: CERVICAL FOUR-FIVE, CERVICAL FIVE-SIX, CERVICAL SIX-SEVEN ANTERIOR CERVICAL DECOMPRESSION/DISCECTOMY FUSION ;  Surgeon: [name], MD;  Location: [facility];  Service: Neurosurgery;  Laterality: N/A;  C45 C56 C67 anterior cervical decompression with fusion interbody prosthesis plating and bonegraft  . APPENDECTOMY    . BACK SURGERY     4th lumbar fusion  . BLADDER SURGERY N/A    with vaginal wall repair  .  BREAST CYST ASPIRATION Bilateral    neg  . BREAST SURGERY Bilateral    cyst removed and reduction  . CARDIAC CATHETERIZATION  2014  . CARPAL TUNNEL RELEASE Right 02/11/2016   Procedure: CARPAL TUNNEL RELEASE;  Surgeon: [name], MD;  Location: [facility];  Service: Orthopedics;  Laterality: Right;  . CATARACT EXTRACTION W/ INTRAOCULAR LENS IMPLANT Bilateral 2015  . CHOLECYSTECTOMY    . EUS N/A 05/31/2012   Procedure: UPPER ENDOSCOPIC ULTRASOUND (EUS) LINEAR;  Surgeon: [name], MD;  Location: [facility];   Service: Endoscopy;  Laterality: N/A;  . EXCISIONAL HEMORRHOIDECTOMY    . JOINT REPLACEMENT Bilateral   . KNEE ARTHROSCOPY WITH LATERAL MENISECTOMY Right 07/07/2015   Procedure: KNEE ARTHROSCOPY WITH LATERAL MENISECTOMY, PARTIAL SYNOVECTOMY;  Surgeon: [name], MD;  Location: [facility];  Service: Orthopedics;  Laterality: Right;  . LUMBAR LAMINECTOMY    . REDUCTION MAMMAPLASTY  1990  . RIGHT OOPHORECTOMY    . TOTAL HIP ARTHROPLASTY Left 05/01/2014   Dr. [name]  . TOTAL HIP ARTHROPLASTY Right 08/05/2014   Procedure: TOTAL HIP ARTHROPLASTY ANTERIOR APPROACH;  Surgeon: [name], MD;  Location: [facility];  Service: Orthopedics;  Laterality: Right;  . ULNAR NERVE TRANSPOSITION Right 02/11/2016   Procedure: ULNAR NERVE DECOMPRESSION/TRANSPOSITION;  Surgeon: [name], MD;  Location: [facility];  Service: Orthopedics;  Laterality: Right;    OB History   No obstetric history on file.      Home Medications    Prior to Admission medications   Medication Sig Start Date End Date Taking? Authorizing Provider  HYDROcodone-acetaminophen (NORCO/VICODIN) 5-325 MG tablet TK 1 T PO QD 04/04/18  Yes [provider]  acetaminophen (TYLENOL) 500 MG tablet Take 500 mg by mouth every 6 (six) hours as needed for mild pain or moderate pain. Reported on 04/06/2015    [provider]  cetirizine (ZYRTEC) 5 MG tablet Take 1 tablet (5 mg total) by mouth daily. 04/17/18   Coral Spikes, DO  Chromium Picolinate 200 MCG CAPS Take 1 capsule by mouth daily.     [provider]  Coenzyme Q10 (CO Q 10) 100 MG CAPS Take 100 mg by mouth every morning.     [provider]  cyclobenzaprine (FLEXERIL) 10 MG tablet Take 10 mg by mouth at bedtime.     [provider]  estradiol (ESTRACE) 0.1 MG/GM vaginal cream Place 1 Applicatorful vaginally 2 (two) times a week.     [provider]  folic acid (FOLVITE) 664 MCG tablet Take 400 mcg by mouth daily.    [provider]  Garlic 4034 MG TBEC Take 2,000 mg by mouth daily.    [provider]  ipratropium (ATROVENT) 0.06 % nasal spray Place 2 sprays into both nostrils 4 (four) times daily as needed for rhinitis. 04/17/18   Coral Spikes, DO  Magnesium 250 MG TABS Take 1 tablet by mouth 2 (two) times daily.    [provider]  Olive Leaf Extract 250 MG CAPS Take 2 capsules by mouth daily.    [provider]  pantoprazole (PROTONIX) 40 MG tablet Take 1 tablet (40 mg total) by mouth 2 (two) times daily before a meal. 03/24/15   Einar Pheasant, MD  Polyethyl Glycol-Propyl Glycol (SYSTANE OP) Apply 1 drop to eye at bedtime.     [provider]  pyridOXINE (VITAMIN B-6) 100 MG tablet Take 100 mg by mouth daily.    [provider]  vitamin B-12 (CYANOCOBALAMIN)  1000 MCG tablet Take 1,000 mcg by mouth daily.    [provider]  vitamin E 400 UNIT capsule Take 400 Units by mouth daily.    [provider]    Family History Family History  Problem Relation Age of Onset  . Heart disease Mother        s/p stent  . Hypertension Mother   . Hypercholesterolemia Mother   . Diabetes Father   . Stomach cancer Other        uncle  . Breast cancer Cousin   . Breast cancer Paternal Aunt     Social History Social History   Tobacco Use  . Smoking status: Former Smoker    Last attempt to quit: 06/13/2007    Years since quitting: 10.8  . Smokeless tobacco: Never Used  Substance Use Topics  . Alcohol use: No    Alcohol/week: 0.0 standard drinks  . Drug use: No     Allergies   Aspirin; Morphine and related; Adhesive [tape]; Clarithromycin; Codeine; Darvon [propoxyphene hcl]; Demerol [meperidine]; Flonase [fluticasone propionate]; Simvastatin; and Talwin [pentazocine]   Review of Systems Review of Systems Per HPI  Physical Exam Triage Vital Signs ED Triage Vitals  Enc Vitals Group     BP 04/17/18 1237 135/67     Pulse Rate 04/17/18 1237 70      Resp 04/17/18 1237 16     Temp 04/17/18 1237 98.6 F (37 C)     Temp Source 04/17/18 1237 Oral     SpO2 04/17/18 1237 97 %     Weight 04/17/18 1235 180 lb (81.6 kg)     Height 04/17/18 1235 5\' 1"  (1.549 m)     Head Circumference --      Peak Flow --      Pain Score 04/17/18 1235 7     Pain Loc --      Pain Edu? --      Excl. in [place]? --    Updated Vital Signs BP 135/67 (BP Location: Left Arm)   Pulse 70   Temp 98.6 F (37 C) (Oral)   Resp 16   Ht 5\' 1"  (1.549 m)   Wt 81.6 kg   SpO2 97%   BMI 34.01 kg/m   Visual Acuity Right Eye Distance:   Left Eye Distance:   Bilateral Distance:    Right Eye Near:   Left Eye Near:    Bilateral Near:     Physical Exam Vitals signs and nursing note reviewed.  Constitutional:      General: She is not in acute distress.    Appearance: She is well-developed.  HENT:     Head: Normocephalic and atraumatic.     Right Ear: Tympanic membrane normal.     Left Ear: Tympanic membrane normal.     Nose: No rhinorrhea.     Mouth/Throat:     Pharynx: Oropharynx is clear.  Eyes:     General:        Right eye: No discharge.        Left eye: No discharge.     Conjunctiva/sclera: Conjunctivae normal.  Cardiovascular:     Rate and Rhythm: Normal rate and regular rhythm.  Pulmonary:     Effort: Pulmonary effort is normal.     Breath sounds: Normal breath sounds.  Neurological:     Mental Status: She is alert.  Psychiatric:        Mood and Affect: Mood normal.  Behavior: Behavior normal.    UC Treatments / Results  Labs (all labs ordered are listed, but only abnormal results are displayed) Labs Reviewed - No data to display  EKG None  Radiology No results found.  Procedures Procedures (including critical care time)  Medications Ordered in UC Medications - No data to display  Initial Impression / Assessment and Plan / UC Course  I have reviewed the triage vital signs and the nursing notes.  Pertinent labs & imaging  results that were available during my care of the patient were reviewed by me and considered in my medical decision making (see chart for details).    73 year old female presents with rhinitis.  Her exam is essentially unremarkable.  She is well-appearing.  There is no evidence of bacterial infection.  Atrovent nasal spray and Zyrtec as directed.   Final Clinical Impressions(s) / UC Diagnoses   Final diagnoses:  Acute rhinitis     Discharge Instructions     Rest.  Fluids.  Medications as prescribed.  Take care  Dr. Lacinda Axon    ED Prescriptions    Medication Sig Dispense Auth. Provider   ipratropium (ATROVENT) 0.06 % nasal spray Place 2 sprays into both nostrils 4 (four) times daily as needed for rhinitis. 15 mL Romeo Zielinski G, DO   cetirizine (ZYRTEC) 5 MG tablet Take 1 tablet (5 mg total) by mouth daily. 30 tablet Coral Spikes, DO     Controlled Substance Prescriptions [place] Controlled Substance Registry consulted? Not Applicable   Coral Spikes, DO 04/17/18 1745

## 2018-04-17 NOTE — ED Triage Notes (Signed)
Patient c/o runny nose, cough, sore throat and bilateral ear pain for 3 weeks.

## 2018-04-17 NOTE — Discharge Instructions (Signed)
Rest.  Fluids.  Medications as prescribed.  Take care  Dr. Lacinda Axon

## 2018-05-09 ENCOUNTER — Other Ambulatory Visit (HOSPITAL_COMMUNITY): Payer: Self-pay | Admitting: Physical Medicine and Rehabilitation

## 2018-05-09 ENCOUNTER — Other Ambulatory Visit: Payer: Self-pay | Admitting: Physical Medicine and Rehabilitation

## 2018-05-09 ENCOUNTER — Ambulatory Visit
Admission: RE | Admit: 2018-05-09 | Discharge: 2018-05-09 | Disposition: A | Discharge status: Home / Self Care | Payer: Medicare Other | Source: Ambulatory Visit | Attending: Physical Medicine and Rehabilitation | Admitting: Physical Medicine and Rehabilitation

## 2018-05-09 DIAGNOSIS — L539 Erythematous condition, unspecified: Secondary | ICD-10-CM | POA: Diagnosis present

## 2018-05-09 DIAGNOSIS — R600 Localized edema: Secondary | ICD-10-CM | POA: Insufficient documentation

## 2018-05-09 DIAGNOSIS — R609 Edema, unspecified: Secondary | ICD-10-CM

## 2018-09-12 ENCOUNTER — Ambulatory Visit: Admit: 2018-09-12 | Payer: Medicare Other | Admitting: Internal Medicine

## 2018-09-12 SURGERY — ESOPHAGOGASTRODUODENOSCOPY (EGD) WITH PROPOFOL
Anesthesia: General

## 2018-10-02 ENCOUNTER — Ambulatory Visit
Admission: EM | Admit: 2018-10-02 | Discharge: 2018-10-02 | Disposition: A | Discharge status: Home / Self Care | Payer: Medicare Other | Attending: Urgent Care | Admitting: Urgent Care

## 2018-10-02 ENCOUNTER — Encounter: Payer: Self-pay | Admitting: Emergency Medicine

## 2018-10-02 ENCOUNTER — Other Ambulatory Visit: Payer: Self-pay

## 2018-10-02 DIAGNOSIS — B370 Candidal stomatitis: Secondary | ICD-10-CM

## 2018-10-02 DIAGNOSIS — K1370 Unspecified lesions of oral mucosa: Secondary | ICD-10-CM | POA: Diagnosis not present

## 2018-10-02 DIAGNOSIS — A059 Bacterial foodborne intoxication, unspecified: Secondary | ICD-10-CM

## 2018-10-02 MED ORDER — MAGIC MOUTHWASH W/LIDOCAINE: 5.0000 mL | Freq: Four times a day (QID) | ORAL | 0 refills | Status: DC | PRN

## 2018-10-02 NOTE — ED Provider Notes (Signed)
[place], [place]   Name: Jennifer Hodges DOB: 11/21/1945 MRN: [id number] CSN: [id number] PCP: Patient, No Pcp Per  Arrival date and time:  10/02/18 0856  Chief Complaint:  Mouth Lesions and Diarrhea   NOTE: Prior to seeing the patient today, I have reviewed the triage nursing documentation and vital signs. Clinical staff has updated patient's PMH/PSHx, current medication list, and drug allergies/intolerances to ensure comprehensive history available to assist in medical decision making.   History:   HPI: Jennifer Hodges is a 73 y.o. female who presents today with complaints of abdominal pain and mouth sores. She notes that symptoms began after eating bad pork last Monday (09/24/2018). Patient notes that the meat smelled fine, but the color was off. She and her husband decided to eat it anyway. She states, "I ate about half of mine, and he ate the whole chop, but it just didn't taste right". After eating, the patient notes that she developed a "souch stomach" and some nausea. The next day, both she and her husband developed minor abdominal pain and diarrhea. Abdominal pain is described as a "soreness". Stool color described as normal with no observed mucous or blood. Patient denies vomiting. She has been able to eat and drink normally despite her symptoms. She notes minor associated fatigue. She denies any vertiginous symptoms or feelings of presyncope.   Patient last experienced an episode of diarrhea x 4 days ago. Since then, she has been "stopped up". Patient taking MiraLax and stool softeners at this point. 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 During the course of her acute illness, patient developed a "thick bad tasting snail slime" coating inside of her mouth, in addition to pain to the roof of her mouth. She has been using warm salt water and hydrogen peroxide mouth rinses to "toughen the mouth skin", which has minimally improved her symptoms.    Past Medical History:  Diagnosis  Date  . Anemia   . Asthma    No Inhalers--Dr. [name] will order as needed  . Bronchiectasis (HCC)    mild  . Chronic headaches     followed by [facility] migraines  . COPD (chronic obstructive pulmonary disease) ([place])   . DDD (degenerative disc disease), lumbar   . Diabetes mellitus without complication [facility])    "doctor says I no longer have diabetes"    . Diverticulosis   . Family history of adverse reaction to anesthesia    PONV  . Gall stones    history of  . GERD (gastroesophageal reflux disease)    EGD 8/09- non bleeding erosive gastritis, documentd esophageal ulcerations.   . Hiatal hernia   . History of pneumonia   . Hypercholesterolemia   . IBS (irritable bowel syndrome)   . Murmur   . Osteoarthritis    lumbar disc disease, left hip  . PONV (postoperative nausea and vomiting)    "Only with last hip and I believe it was due to the morphine"   . Sleep apnea    cpap asked to bring mask and tubing  . Weakness of right side of body     Past Surgical History:  Procedure Laterality Date  . ABDOMINAL HYSTERECTOMY  age 41  . ANTERIOR CERVICAL DECOMP/DISCECTOMY FUSION N/A 02/24/2015   Procedure: CERVICAL FOUR-FIVE, CERVICAL FIVE-SIX, CERVICAL SIX-SEVEN ANTERIOR CERVICAL DECOMPRESSION/DISCECTOMY FUSION ;  Surgeon: [name], MD;  Location: [facility]O ORS;  Service: Neurosurgery;  Laterality: N/A;  C45 C56 C67 anterior cervical decompression with fusion  interbody prosthesis plating and bonegraft  . APPENDECTOMY    . BACK SURGERY     4th lumbar fusion  . BLADDER SURGERY N/A    with vaginal wall repair  . BREAST CYST ASPIRATION Bilateral    neg  . BREAST SURGERY Bilateral    cyst removed and reduction  . CARDIAC CATHETERIZATION  2014  . CARPAL TUNNEL RELEASE Right 02/11/2016   Procedure: CARPAL TUNNEL RELEASE;  Surgeon: [name], MD;  Location: [facility] ORS;  Service: Orthopedics;  Laterality: Right;  . CATARACT EXTRACTION W/ INTRAOCULAR LENS IMPLANT Bilateral  2015  . CHOLECYSTECTOMY    . EUS N/A 05/31/2012   Procedure: UPPER ENDOSCOPIC ULTRASOUND (EUS) LINEAR;  Surgeon: [name], MD;  Location: [facility] ENDOSCOPY;  Service: Endoscopy;  Laterality: N/A;  . EXCISIONAL HEMORRHOIDECTOMY    . JOINT REPLACEMENT Bilateral   . KNEE ARTHROSCOPY WITH LATERAL MENISECTOMY Right 07/07/2015   Procedure: KNEE ARTHROSCOPY WITH LATERAL MENISECTOMY, PARTIAL SYNOVECTOMY;  Surgeon: [name], MD;  Location: [facility] ORS;  Service: Orthopedics;  Laterality: Right;  . LUMBAR LAMINECTOMY    . REDUCTION MAMMAPLASTY  1990  . RIGHT OOPHORECTOMY    . TOTAL HIP ARTHROPLASTY Left 05/01/2014   Dr. [name]  . TOTAL HIP ARTHROPLASTY Right 08/05/2014   Procedure: TOTAL HIP ARTHROPLASTY ANTERIOR APPROACH;  Surgeon: [name], MD;  Location: [facility] ORS;  Service: Orthopedics;  Laterality: Right;  . ULNAR NERVE TRANSPOSITION Right 02/11/2016   Procedure: ULNAR NERVE DECOMPRESSION/TRANSPOSITION;  Surgeon: [name], MD;  Location: [facility] ORS;  Service: Orthopedics;  Laterality: Right;    Family History  Problem Relation Age of Onset  . Heart disease Mother        s/p stent  . Hypertension Mother   . Hypercholesterolemia Mother   . Diabetes Father   . Stomach cancer Other        uncle  . Breast cancer Cousin   . Breast cancer Paternal Aunt     Social History   Tobacco Use  . Smoking status: Former Smoker    Quit date: 06/13/2007    Years since quitting: 11.3  . Smokeless tobacco: Never Used  Substance Use Topics  . Alcohol use: No    Alcohol/week: 0.0 standard drinks  . Drug use: No    Patient Active Problem List   Diagnosis Date Noted  . Knee pain 05/11/2015  . Chest tightness 03/24/2015  . Stool incontinence 03/24/2015  . Urinary frequency 03/04/2015  . Cervical spondylosis with radiculopathy 02/24/2015  . Pre-syncope 02/19/2015  . URI (upper respiratory infection) 02/15/2015  . Neck pain 12/25/2014  . Pelvic pain in female 11/16/2014  . Primary  osteoarthritis of hip 08/05/2014  . Heel pain 06/01/2014  . Diarrhea 06/01/2014  . Health care maintenance 06/01/2014  . Right leg pain 03/30/2014  . Right arm pain 03/30/2014  . Sinusitis 03/30/2014  . Internal nasal lesion 11/12/2013  . Rib pain 08/04/2013  . Elevated AFP 04/28/2013  . Osteoporosis 11/12/2012  . Nonspecific (abnormal) findings on radiological and other examination of gastrointestinal tract 05/31/2012  . Bronchiectasis ([place]) 02/05/2012  . Sleep apnea 02/05/2012  . Degenerative disc disease, cervical 02/05/2012  . Hypercholesterolemia 02/05/2012  . GERD (gastroesophageal reflux disease) 02/05/2012  . Chronic headaches 02/05/2012    Home Medications:    Current Meds  Medication Sig  . cetirizine (ZYRTEC) 5 MG tablet Take 1 tablet (5 mg total) by mouth daily.  . Coenzyme Q10 (CO Q 10) 100 MG CAPS Take 100 mg by  mouth every morning.   . cyclobenzaprine (FLEXERIL) 10 MG tablet Take 10 mg by mouth at bedtime.   Marland Kitchen estradiol (ESTRACE) 0.1 MG/GM vaginal cream Place 1 Applicatorful vaginally 2 (two) times a week.   . folic acid (FOLVITE) 595 MCG tablet Take 400 mcg by mouth daily.  . Garlic 6387 MG TBEC Take 2,000 mg by mouth daily.  Marland Kitchen HYDROcodone-acetaminophen (NORCO/VICODIN) 5-325 MG tablet TK 1 T PO QD  . ipratropium (ATROVENT) 0.06 % nasal spray Place 2 sprays into both nostrils 4 (four) times daily as needed for rhinitis.  . Magnesium 250 MG TABS Take 1 tablet by mouth 2 (two) times daily.  Jonna Coup Leaf Extract 250 MG CAPS Take 2 capsules by mouth daily.  . pantoprazole (PROTONIX) 40 MG tablet Take 1 tablet (40 mg total) by mouth 2 (two) times daily before a meal.  . pyridOXINE (VITAMIN B-6) 100 MG tablet Take 100 mg by mouth daily.  . vitamin B-12 (CYANOCOBALAMIN) 1000 MCG tablet Take 1,000 mcg by mouth daily.  . vitamin E 400 UNIT capsule Take 400 Units by mouth daily.    Allergies:   Aspirin, Morphine and related, Adhesive [tape], Clarithromycin, Codeine,  Darvon [propoxyphene hcl], Demerol [meperidine], Flonase [fluticasone propionate], Simvastatin, and Talwin [pentazocine]  Review of Systems (ROS): Review of Systems  Constitutional: Positive for fatigue. Negative for chills and fever.  HENT: Positive for mouth sores. Negative for congestion, rhinorrhea, sinus pressure, sinus pain and sore throat.   Respiratory: Negative for cough and shortness of breath.   Cardiovascular: Negative for chest pain and palpitations.  Gastrointestinal: Negative for abdominal pain.       Alternating bowel habits  Musculoskeletal: Negative for back pain, myalgias and neck pain.  Skin: Negative for color change, pallor and rash.  Neurological: Negative for dizziness, syncope, weakness and light-headedness.  All other systems reviewed and are negative.    Vital Signs: Today's Vitals   10/02/18 0916 10/02/18 0920 10/02/18 0942  BP:  125/68   Pulse:  81   Resp:  16   Temp:  98.4 F (36.9 C)   TempSrc:  Oral   SpO2:  96%   Weight: 182 lb (82.6 kg)    Height: 5' 1.5" (1.562 m)    PainSc: 0-No pain  0-No pain    Physical Exam: Physical Exam  Constitutional: She is oriented to person, place, and time and well-developed, well-nourished, and in no distress. She appears not lethargic and not dehydrated.  Non-toxic appearance.  HENT:  Head: Normocephalic and atraumatic.  Mouth/Throat: Mucous membranes are normal. No posterior oropharyngeal edema or posterior oropharyngeal erythema.    Buccal mucosa and tongue with white patches/coating.   Eyes: Pupils are equal, round, and reactive to light. EOM are normal.  Neck: Normal range of motion. Neck supple. No tracheal deviation present.  Cardiovascular: Normal rate, regular rhythm, normal heart sounds and intact distal pulses. Exam reveals no gallop and no friction rub.  No murmur heard. Pulmonary/Chest: Effort normal and breath sounds normal. No respiratory distress. She has no wheezes. She has no rales.   Abdominal: Soft. Bowel sounds are normal. She exhibits no distension. There is generalized abdominal tenderness ("soreness" from "going back and forth between diarrhea and being stopped up").  Lymphadenopathy:    She has no cervical adenopathy.  Neurological: She is alert and oriented to person, place, and time. She appears not lethargic. Gait normal. GCS score is 15.  Skin: Skin is warm and dry. No rash noted. She is not  diaphoretic.  Psychiatric: Mood, memory, affect and judgment normal.  Nursing note and vitals reviewed.   Urgent Care Treatments / Results:   LABS: PLEASE NOTE: all labs that were ordered this encounter are listed, however only abnormal results are displayed. Labs Reviewed - No data to display  EKG: -None  RADIOLOGY: No results found.  PROCEDURES: Procedures  MEDICATIONS RECEIVED THIS VISIT: Medications - No data to display  PERTINENT CLINICAL COURSE NOTES/UPDATES:   Initial Impression / Assessment and Plan / Urgent Care Course:  Pertinent labs & imaging results that were available during my care of the patient were personally reviewed by me and considered in my medical decision making (see lab/imaging section of note for values and interpretations).  Jennifer Hodges is a 73 y.o. female who presents to [facility] today with complaints of mouth sores, abdominal tenderness, and varying bowel habits.   Patient is well appearing overall in clinic today. She does not appear to be in any acute distress. Presenting symptoms (see HPI) and exam as documented above. Bowel habits are variable at this point. No diarrhea in 4 days. Abdomen "sore" from alternating bowel habits; no focal areas of pain noted on exam. Patient now taking MiraLax and stool softeners. Patient encouraged to avoid laxatives unless she has not had a bowel movement in 3 days. Reminded her of diarrhea just a few days ago. Current symptomology consistent with a foodborne gastroenteritis that is  seemingly resolving in this patient. Any recurrence of diarrhea, or the development of fevers, hematochezia, or mucous containing stools in this patient would warrant stool studies to assess for diarrhea of infectious etiology. With symptoms that are resolving, will defer stool testing at this juncture. Patient already on COT (Norco), thus no further interventions for pain needed, as she will continue her current prescriptions. Patient encouraged to ensure adequate hydration using ORS to replace electrolyte losses resulting from acute illness. Husband continues to have diarrhea since. Suggested that he be seen for evaluation and possible stool testing.   Her most distressing symptom today is her mouth sores and coating. Symptoms and appearance consistent with fungal overgrowth (thick white coating). With the associated sore to the roof of her mouth on the RIGHT side, suspect candidal stomatitis. Will treat with magic mouthwash with lidocaine QID PRN x 1 week, then PRN. Patient instructed on use; swish and spit.   Discussed follow up with primary care physician in 1 week for re-evaluation. I have reviewed the follow up and strict return precautions for any new or worsening symptoms. Patient is aware of symptoms that would be deemed urgent/emergent, and would thus require further evaluation either here or in the emergency department. At the time of discharge, she verbalized understanding and consent with the discharge plan as it was reviewed with her. All questions were fielded by provider and/or clinic staff prior to patient discharge.    Final Clinical Impressions / Urgent Care Diagnoses:   Final diagnoses:  Candidal stomatitis  Foodborne gastroenteritis    New Prescriptions:  [place] Controlled Substance Registry consulted? Not Applicable  Meds ordered this encounter  Medications  . magic mouthwash w/lidocaine SOLN    Sig: Take 5 mLs by mouth 4 (four) times daily as needed for mouth pain. SWISH AND  SPIT    Dispense:  150 mL    Refill:  0    Recommended Follow up Care:  Patient encouraged to follow up with the following provider within the specified time frame, or sooner as dictated by  the severity of her symptoms. As always, she was instructed that for any urgent/emergent care needs, she should seek care either here or in the emergency department for more immediate evaluation.  Follow-up Information    PCP In 1 week.   Why: General reassessment of symptoms if not improving        NOTE: This note was prepared using Lobbyist along with smaller Company secretary. Despite my best ability to proofread, there is the potential that transcriptional errors may still occur from this process, and are completely unintentional.     Karen Kitchens, NP 10/03/18 1205

## 2018-10-02 NOTE — Discharge Instructions (Signed)
It was very nice seeing you today in clinic. Thank you for entrusting me with your care.   Please utilize the medications that we discussed. Your prescriptions have been called in to your pharmacy. Increase fluid intake as much as possible. Water is always best, as sugar and caffeine containing fluids can cause you to become dehydrated. Try to incorporate electrolyte enriched fluids, such as Gatorade or Pedialyte, into your daily fluid intake.   Make arrangements to follow up with your regular doctor in 1 week for re-evaluation if not improving. If your symptoms/condition worsens, please seek follow up care either here or in the ER. Please remember, our [place] providers are "right here with you" when you need [place].   Again, it was my pleasure to take care of you today. Thank you for choosing our clinic. I hope that you start to feel better quickly.   Honor Loh, MSN, APRN, FNP-C, CEN Advanced Practice Provider [facility]

## 2018-10-02 NOTE — ED Triage Notes (Signed)
Patient reports eating possibly a bad batch of pork chops two weeks ago.  Patient reports stomach discomfort and diarrhea and mouth sores for 2 weeks.  Patient denies fevers.

## 2018-10-18 ENCOUNTER — Encounter: Payer: Self-pay | Admitting: Emergency Medicine

## 2018-10-18 ENCOUNTER — Other Ambulatory Visit: Payer: Self-pay

## 2018-10-18 ENCOUNTER — Ambulatory Visit
Admission: EM | Admit: 2018-10-18 | Discharge: 2018-10-18 | Disposition: A | Discharge status: Home / Self Care | Payer: Medicare Other | Attending: Family | Admitting: Family

## 2018-10-18 DIAGNOSIS — M4322 Fusion of spine, cervical region: Secondary | ICD-10-CM | POA: Diagnosis not present

## 2018-10-18 DIAGNOSIS — M5136 Other intervertebral disc degeneration, lumbar region: Secondary | ICD-10-CM | POA: Diagnosis not present

## 2018-10-18 DIAGNOSIS — Z8349 Family history of other endocrine, nutritional and metabolic diseases: Secondary | ICD-10-CM | POA: Insufficient documentation

## 2018-10-18 DIAGNOSIS — Z803 Family history of malignant neoplasm of breast: Secondary | ICD-10-CM | POA: Insufficient documentation

## 2018-10-18 DIAGNOSIS — D649 Anemia, unspecified: Secondary | ICD-10-CM | POA: Diagnosis not present

## 2018-10-18 DIAGNOSIS — G4730 Sleep apnea, unspecified: Secondary | ICD-10-CM | POA: Insufficient documentation

## 2018-10-18 DIAGNOSIS — M810 Age-related osteoporosis without current pathological fracture: Secondary | ICD-10-CM | POA: Diagnosis not present

## 2018-10-18 DIAGNOSIS — Z800 Family history of malignant neoplasm of digestive organs: Secondary | ICD-10-CM | POA: Insufficient documentation

## 2018-10-18 DIAGNOSIS — R05 Cough: Secondary | ICD-10-CM | POA: Insufficient documentation

## 2018-10-18 DIAGNOSIS — J209 Acute bronchitis, unspecified: Secondary | ICD-10-CM | POA: Insufficient documentation

## 2018-10-18 DIAGNOSIS — E119 Type 2 diabetes mellitus without complications: Secondary | ICD-10-CM | POA: Diagnosis not present

## 2018-10-18 DIAGNOSIS — Z87891 Personal history of nicotine dependence: Secondary | ICD-10-CM

## 2018-10-18 DIAGNOSIS — Z885 Allergy status to narcotic agent status: Secondary | ICD-10-CM | POA: Insufficient documentation

## 2018-10-18 DIAGNOSIS — H6122 Impacted cerumen, left ear: Secondary | ICD-10-CM | POA: Diagnosis not present

## 2018-10-18 DIAGNOSIS — J029 Acute pharyngitis, unspecified: Secondary | ICD-10-CM

## 2018-10-18 DIAGNOSIS — Z8249 Family history of ischemic heart disease and other diseases of the circulatory system: Secondary | ICD-10-CM | POA: Diagnosis not present

## 2018-10-18 DIAGNOSIS — Z833 Family history of diabetes mellitus: Secondary | ICD-10-CM | POA: Insufficient documentation

## 2018-10-18 DIAGNOSIS — R059 Cough, unspecified: Secondary | ICD-10-CM

## 2018-10-18 DIAGNOSIS — Z9049 Acquired absence of other specified parts of digestive tract: Secondary | ICD-10-CM | POA: Insufficient documentation

## 2018-10-18 DIAGNOSIS — M1610 Unilateral primary osteoarthritis, unspecified hip: Secondary | ICD-10-CM | POA: Diagnosis not present

## 2018-10-18 DIAGNOSIS — Z96643 Presence of artificial hip joint, bilateral: Secondary | ICD-10-CM | POA: Insufficient documentation

## 2018-10-18 DIAGNOSIS — Z888 Allergy status to other drugs, medicaments and biological substances status: Secondary | ICD-10-CM | POA: Insufficient documentation

## 2018-10-18 DIAGNOSIS — K219 Gastro-esophageal reflux disease without esophagitis: Secondary | ICD-10-CM | POA: Diagnosis not present

## 2018-10-18 DIAGNOSIS — M5030 Other cervical disc degeneration, unspecified cervical region: Secondary | ICD-10-CM | POA: Insufficient documentation

## 2018-10-18 DIAGNOSIS — J440 Chronic obstructive pulmonary disease with acute lower respiratory infection: Secondary | ICD-10-CM

## 2018-10-18 DIAGNOSIS — E7800 Pure hypercholesterolemia, unspecified: Secondary | ICD-10-CM | POA: Insufficient documentation

## 2018-10-18 DIAGNOSIS — J0110 Acute frontal sinusitis, unspecified: Secondary | ICD-10-CM | POA: Diagnosis present

## 2018-10-18 DIAGNOSIS — Z886 Allergy status to analgesic agent status: Secondary | ICD-10-CM | POA: Insufficient documentation

## 2018-10-18 DIAGNOSIS — Z20828 Contact with and (suspected) exposure to other viral communicable diseases: Secondary | ICD-10-CM | POA: Diagnosis not present

## 2018-10-18 DIAGNOSIS — Z79899 Other long term (current) drug therapy: Secondary | ICD-10-CM | POA: Insufficient documentation

## 2018-10-18 DIAGNOSIS — Z881 Allergy status to other antibiotic agents status: Secondary | ICD-10-CM | POA: Insufficient documentation

## 2018-10-18 LAB — RAPID STREP SCREEN (MED CTR MEBANE ONLY): Streptococcus, Group A Screen (Direct): NEGATIVE

## 2018-10-18 MED ORDER — DOXYCYCLINE HYCLATE 100 MG PO CAPS: 100.0000 mg | ORAL_CAPSULE | Freq: Two times a day (BID) | ORAL | 0 refills | Status: AC

## 2018-10-18 MED ORDER — BENZONATATE 100 MG PO CAPS: 100.0000 mg | ORAL_CAPSULE | Freq: Three times a day (TID) | ORAL | 0 refills | Status: DC | PRN

## 2018-10-18 MED ORDER — ALBUTEROL SULFATE HFA 108 (90 BASE) MCG/ACT IN AERS: 2.0000 | INHALATION_SPRAY | Freq: Four times a day (QID) | RESPIRATORY_TRACT | 0 refills | Status: DC | PRN

## 2018-10-18 NOTE — ED Triage Notes (Addendum)
Patient reports productive cough and sore throat x 3-4 days. Patient unsure if she has had a fever as she has not checked her temp. Patient also reports left ear pain.

## 2018-10-18 NOTE — Discharge Instructions (Addendum)
Recommend start Doxycycline 100mg  twice a day as directed. May take Tessalon cough pills 1 every 8 hours as needed. May also use Albuterol inhaler 2 puffs every 6 hours as needed for wheezing or shortness of breath. May use Debrox or similar OTC ear wax treatment to help remove wax from left ear canal. Continue to push fluids. Rest. Avoid people and stay home until COVID results are available. If increased difficulty breathing, cough, fever or chest pain occurs, go to the ER ASAP. Otherwise, follow-up pending lab results.

## 2018-10-18 NOTE — ED Provider Notes (Signed)
[facility]    CSN: [id number] Arrival date & time: 10/18/18  1309     History   Chief Complaint Chief Complaint  Patient presents with  . Cough  . Sore Throat    HPI Jennifer Hodges is a 73 y.o. female.   73 year old female presents with sore throat, productive cough, slight nasal congestion and frontal sinus headache and tenderness for the past 4 days. Uncertain if she has had a fever. Also feels fatigued and having shortness of breath. Coughing up green mucus. Denies any GI symptoms. Has history of COPD, asthma, and chronic bronchitis - currently not on any inhalers. Husband also has slight cough but no known exposure to COVID 19. Has been using OTC cough drops with some relief. Other chronic health issues include GERD, irritable bowel, anemia, Osteoporosis, arthritis, hyperlipidemia, sleep apnea and chronic headaches. Currently on Protonix, Flexeril, Tylenol, Reclast IV, Zyrtec, Atrovent nasal spray, B12, Folate, and Estrace cream along with various herbal supplements.   The history is provided by the patient.    Past Medical History:  Diagnosis Date  . Anemia   . Asthma    No Inhalers--Dr. [name] will order as needed  . Bronchiectasis (HCC)    mild  . Chronic headaches     followed by [facility] migraines  . COPD (chronic obstructive pulmonary disease) ([place])   . DDD (degenerative disc disease), lumbar   . Diabetes mellitus without complication [facility])    "doctor says I no longer have diabetes"    . Diverticulosis   . Family history of adverse reaction to anesthesia    PONV  . Gall stones    history of  . GERD (gastroesophageal reflux disease)    EGD 8/09- non bleeding erosive gastritis, documentd esophageal ulcerations.   . Hiatal hernia   . History of pneumonia   . Hypercholesterolemia   . IBS (irritable bowel syndrome)   . Murmur   . Osteoarthritis    lumbar disc disease, left hip  . PONV (postoperative nausea and vomiting)    "Only with last  hip and I believe it was due to the morphine"   . Sleep apnea    cpap asked to bring mask and tubing  . Weakness of right side of body     Patient Active Problem List   Diagnosis Date Noted  . Knee pain 05/11/2015  . Chest tightness 03/24/2015  . Stool incontinence 03/24/2015  . Urinary frequency 03/04/2015  . Cervical spondylosis with radiculopathy 02/24/2015  . Pre-syncope 02/19/2015  . URI (upper respiratory infection) 02/15/2015  . Neck pain 12/25/2014  . Pelvic pain in female 11/16/2014  . Primary osteoarthritis of hip 08/05/2014  . Heel pain 06/01/2014  . Diarrhea 06/01/2014  . Health care maintenance 06/01/2014  . Right leg pain 03/30/2014  . Right arm pain 03/30/2014  . Sinusitis 03/30/2014  . Internal nasal lesion 11/12/2013  . Rib pain 08/04/2013  . Elevated AFP 04/28/2013  . Osteoporosis 11/12/2012  . Nonspecific (abnormal) findings on radiological and other examination of gastrointestinal tract 05/31/2012  . Bronchiectasis ([place]) 02/05/2012  . Sleep apnea 02/05/2012  . Degenerative disc disease, cervical 02/05/2012  . Hypercholesterolemia 02/05/2012  . GERD (gastroesophageal reflux disease) 02/05/2012  . Chronic headaches 02/05/2012    Past Surgical History:  Procedure Laterality Date  . ABDOMINAL HYSTERECTOMY  age 48  . ANTERIOR CERVICAL DECOMP/DISCECTOMY FUSION N/A 02/24/2015   Procedure: CERVICAL FOUR-FIVE, CERVICAL FIVE-SIX, CERVICAL SIX-SEVEN ANTERIOR CERVICAL DECOMPRESSION/DISCECTOMY FUSION ;  Surgeon: [name], MD;  Location: [facility];  Service: Neurosurgery;  Laterality: N/A;  C45 C56 C67 anterior cervical decompression with fusion interbody prosthesis plating and bonegraft  . APPENDECTOMY    . BACK SURGERY     4th lumbar fusion  . BLADDER SURGERY N/A    with vaginal wall repair  . BREAST CYST ASPIRATION Bilateral    neg  . BREAST SURGERY Bilateral    cyst removed and reduction  . CARDIAC CATHETERIZATION  2014  . CARPAL TUNNEL  RELEASE Right 02/11/2016   Procedure: CARPAL TUNNEL RELEASE;  Surgeon: [name], MD;  Location: [facility];  Service: Orthopedics;  Laterality: Right;  . CATARACT EXTRACTION W/ INTRAOCULAR LENS IMPLANT Bilateral 2015  . CHOLECYSTECTOMY    . EUS N/A 05/31/2012   Procedure: UPPER ENDOSCOPIC ULTRASOUND (EUS) LINEAR;  Surgeon: [name], MD;  Location: [facility];  Service: Endoscopy;  Laterality: N/A;  . EXCISIONAL HEMORRHOIDECTOMY    . JOINT REPLACEMENT Bilateral   . KNEE ARTHROSCOPY WITH LATERAL MENISECTOMY Right 07/07/2015   Procedure: KNEE ARTHROSCOPY WITH LATERAL MENISECTOMY, PARTIAL SYNOVECTOMY;  Surgeon: [name], MD;  Location: [facility];  Service: Orthopedics;  Laterality: Right;  . LUMBAR LAMINECTOMY    . REDUCTION MAMMAPLASTY  1990  . RIGHT OOPHORECTOMY    . TOTAL HIP ARTHROPLASTY Left 05/01/2014   Dr. [name]  . TOTAL HIP ARTHROPLASTY Right 08/05/2014   Procedure: TOTAL HIP ARTHROPLASTY ANTERIOR APPROACH;  Surgeon: [name], MD;  Location: [facility];  Service: Orthopedics;  Laterality: Right;  . ULNAR NERVE TRANSPOSITION Right 02/11/2016   Procedure: ULNAR NERVE DECOMPRESSION/TRANSPOSITION;  Surgeon: [name], MD;  Location: [facility];  Service: Orthopedics;  Laterality: Right;    OB History   No obstetric history on file.      Home Medications    Prior to Admission medications   Medication Sig Start Date End Date Taking? Authorizing Provider  acetaminophen (TYLENOL) 500 MG tablet Take 500 mg by mouth every 6 (six) hours as needed for mild pain or moderate pain. Reported on 04/06/2015   Yes [provider]  cetirizine (ZYRTEC) 5 MG tablet Take 1 tablet (5 mg total) by mouth daily. 04/17/18  Yes Cook, Jayce G, DO  Chromium Picolinate 200 MCG CAPS Take 1 capsule by mouth daily.    Yes [provider]  Coenzyme Q10 (CO Q 10) 100 MG CAPS Take 100 mg by mouth every morning.    Yes [provider]  cyclobenzaprine (FLEXERIL) 10 MG  tablet Take 10 mg by mouth at bedtime.    Yes [provider]  estradiol (ESTRACE) 0.1 MG/GM vaginal cream Place 1 Applicatorful vaginally 2 (two) times a week.    Yes [provider]  folic acid (FOLVITE) 161 MCG tablet Take 400 mcg by mouth daily.   Yes [provider]  Garlic 0960 MG TBEC Take 2,000 mg by mouth daily.   Yes [provider]  ipratropium (ATROVENT) 0.06 % nasal spray Place 2 sprays into both nostrils 4 (four) times daily as needed for rhinitis. 04/17/18  Yes Cook, Jayce G, DO  Magnesium 250 MG TABS Take 1 tablet by mouth 2 (two) times daily.   Yes [provider]  pantoprazole (PROTONIX) 40 MG tablet Take 1 tablet (40 mg total) by mouth 2 (two) times daily before a meal. 03/24/15  Yes Einar Pheasant, MD  pyridOXINE (VITAMIN B-6) 100 MG tablet Take 100 mg by mouth daily.   Yes [provider]  vitamin B-12 (CYANOCOBALAMIN) 1000 MCG tablet Take 1,000 mcg by mouth daily.   Yes [provider]  vitamin E 400 UNIT capsule Take 400 Units by mouth daily.   Yes [provider]  Zoledronic Acid (RECLAST IV) Inject into the vein.   Yes [provider]  albuterol (VENTOLIN HFA) 108 (90 Base) MCG/ACT inhaler Inhale 2 puffs into the lungs every 6 (six) hours as needed for wheezing or shortness of breath. 10/18/18   Katy Apo, NP  benzonatate (TESSALON) 100 MG capsule Take 1 capsule (100 mg total) by mouth 3 (three) times daily as needed for cough. 10/18/18   Katy Apo, NP  doxycycline (VIBRAMYCIN) 100 MG capsule Take 1 capsule (100 mg total) by mouth 2 (two) times daily for 7 days. 10/18/18 10/25/18  Katy Apo, NP    Family History Family History  Problem Relation Age of Onset  . Heart disease Mother        s/p stent  . Hypertension Mother   . Hypercholesterolemia Mother   . Diabetes Father   . Stomach cancer Other        uncle  . Breast cancer Cousin   . Breast cancer Paternal Aunt      Social History Social History   Tobacco Use  . Smoking status: Former Smoker    Quit date: 06/13/2007    Years since quitting: 11.3  . Smokeless tobacco: Never Used  Substance Use Topics  . Alcohol use: No    Alcohol/week: 0.0 standard drinks  . Drug use: No     Allergies   Aspirin, Morphine and related, Adhesive [tape], Clarithromycin, Codeine, Darvon [propoxyphene hcl], Demerol [meperidine], Flonase [fluticasone propionate], Simvastatin, and Talwin [pentazocine]   Review of Systems Review of Systems  Constitutional: Positive for fatigue. Negative for activity change, appetite change, chills and diaphoresis.  HENT: Positive for congestion, ear pain (left ear discomfort), postnasal drip, sinus pressure, sinus pain and sore throat. Negative for ear discharge, facial swelling, mouth sores, nosebleeds, rhinorrhea, sneezing and trouble swallowing.   Eyes: Negative for pain, discharge, redness and itching.  Respiratory: Positive for cough, chest tightness and shortness of breath. Negative for wheezing.   Cardiovascular: Negative for chest pain.  Gastrointestinal: Negative for abdominal pain, nausea and vomiting.  Musculoskeletal: Positive for arthralgias, joint swelling and myalgias. Negative for neck pain and neck stiffness.  Skin: Negative for color change, rash and wound.  Allergic/Immunologic: Positive for environmental allergies and food allergies.  Neurological: Positive for light-headedness and headaches. Negative for dizziness, tremors, seizures, syncope, facial asymmetry, weakness and numbness.  Hematological: Negative for adenopathy. Does not bruise/bleed easily.  Psychiatric/Behavioral: Positive for sleep disturbance.     Physical Exam Triage Vital Signs ED Triage Vitals  Enc Vitals Group     BP 10/18/18 1341 132/66     Pulse Rate 10/18/18 1341 74     Resp 10/18/18 1341 18     Temp 10/18/18 1341 98.1 F (36.7 C)     Temp Source 10/18/18 1341 Oral     SpO2 10/18/18  1341 98 %     Weight 10/18/18 1335 179 lb (81.2 kg)     Height 10/18/18 1335 5\' 1"  (1.549 m)     Head Circumference --      Peak Flow --      Pain Score 10/18/18 1335 6     Pain Loc --      Pain Edu? --      Excl. in [place]? --  No data found.  Updated Vital Signs BP 132/66 (BP Location: Right Arm)   Pulse 74   Temp 98.1 F (36.7 C) (Oral)   Resp 18   Ht 5\' 1"  (1.549 m)   Wt 179 lb (81.2 kg)   SpO2 98%   BMI 33.82 kg/m   Visual Acuity Right Eye Distance:   Left Eye Distance:   Bilateral Distance:    Right Eye Near:   Left Eye Near:    Bilateral Near:     Physical Exam Vitals signs and nursing note reviewed.  Constitutional:      General: She is awake. She is not in acute distress.    Appearance: She is well-developed and well-groomed. She is ill-appearing.     Comments: Patient sitting in exam chair in no acute distress but appears ill and tired (slight swelling and redness around her eyes).   HENT:     Head: Normocephalic and atraumatic.     Right Ear: Hearing, tympanic membrane, ear canal and external ear normal. Tympanic membrane is not injected.     Left Ear: Hearing and external ear normal. There is impacted cerumen. Tympanic membrane is not injected.     Ears:     Comments: Some soft yellow cerumen present deep within left ear canal. Still able to visualize TM- no redness.     Nose: Mucosal edema and congestion present. No rhinorrhea.     Right Turbinates: Swollen.     Left Turbinates: Swollen.     Right Sinus: Frontal sinus tenderness present. No maxillary sinus tenderness.     Left Sinus: Frontal sinus tenderness present. No maxillary sinus tenderness.     Mouth/Throat:     Lips: Pink.     Mouth: Mucous membranes are moist.     Pharynx: Uvula midline. Posterior oropharyngeal erythema present. No pharyngeal swelling or oropharyngeal exudate.  Eyes:     General: Allergic shiner present.     Extraocular Movements: Extraocular movements intact.      Conjunctiva/sclera: Conjunctivae normal.  Neck:     Musculoskeletal: Normal range of motion and neck supple. No muscular tenderness.  Cardiovascular:     Rate and Rhythm: Normal rate and regular rhythm.     Heart sounds: Normal heart sounds.  Pulmonary:     Effort: Pulmonary effort is normal. No tachypnea or respiratory distress.     Breath sounds: Normal air entry. Examination of the right-upper field reveals decreased breath sounds and wheezing. Examination of the left-upper field reveals decreased breath sounds and wheezing. Examination of the right-lower field reveals decreased breath sounds. Examination of the left-lower field reveals decreased breath sounds. Decreased breath sounds and wheezing present. No rhonchi or rales.     Comments: Coarse breath sounds in both upper lobes when coughing.  Lymphadenopathy:     Cervical: Cervical adenopathy present.     Right cervical: Superficial cervical adenopathy present.     Left cervical: Superficial cervical adenopathy present.  Skin:    General: Skin is warm and dry.     Capillary Refill: Capillary refill takes less than 2 seconds.     Findings: No rash.  Neurological:     General: No focal deficit present.     Mental Status: She is alert and oriented to person, place, and time.  Psychiatric:        Mood and Affect: Mood normal.        Behavior: Behavior normal. Behavior is cooperative.      UC Treatments / Results  Labs (all labs ordered are listed, but only abnormal results are displayed) Labs Reviewed  RAPID STREP SCREEN ([facility] ONLY)  NOVEL CORONAVIRUS, NAA (HOSPITAL ORDER, SEND-OUT TO REF LAB)  CULTURE, GROUP A STREP [facility])    EKG   Radiology No results found.  Procedures Procedures (including critical care time)  Medications Ordered in UC Medications - No data to display  Initial Impression / Assessment and Plan / UC Course  I have reviewed the triage vital signs and the nursing notes.  Pertinent labs  & imaging results that were available during my care of the patient were reviewed by me and considered in my medical decision making (see chart for details).     Reviewed negative rapid strep test with patient. Discussed possibility of COVID 19. However, with history of recurrent bronchitis with chronic COPD and frequent sinus infections, will treat for possible early sinusitis and bronchitis. May start Doxycycline 100mg  twice a day as directed. May take Tessalon cough pills 1 every 8 hours as needed. May use Albuterol inhaler 2 puffs every 6 hours as needed. Continue to monitor symptoms and push fluids. May use Debrox or similar OTC ear wax treatment to help remove wax from left ear canal. Will have patient return for cerumen removal after patient feels better and COVID test results are negative. Rest. Avoid people and stay home until COVID 19 results are available. If any increased difficulty breathing, chest pain, cough or fever develop, go to the ER ASAP. Otherwise, follow-up pending lab results.  Final Clinical Impressions(s) / UC Diagnoses   Final diagnoses:  Acute non-recurrent frontal sinusitis  COPD with acute bronchitis (HCC)  Cough  Pharyngitis, unspecified etiology  Excessive cerumen in left ear canal     Discharge Instructions     Recommend start Doxycycline 100mg  twice a day as directed. May take Tessalon cough pills 1 every 8 hours as needed. May also use Albuterol inhaler 2 puffs every 6 hours as needed for wheezing or shortness of breath. May use Debrox or similar OTC ear wax treatment to help remove wax from left ear canal. Continue to push fluids. Rest. Avoid people and stay home until COVID results are available. If increased difficulty breathing, cough, fever or chest pain occurs, go to the ER ASAP. Otherwise, follow-up pending lab results.     ED Prescriptions    Medication Sig Dispense Auth. Provider   doxycycline (VIBRAMYCIN) 100 MG capsule Take 1 capsule (100 mg  total) by mouth 2 (two) times daily for 7 days. 14 capsule Katy Apo, NP   benzonatate (TESSALON) 100 MG capsule Take 1 capsule (100 mg total) by mouth 3 (three) times daily as needed for cough. 21 capsule Katy Apo, NP   albuterol (VENTOLIN HFA) 108 (90 Base) MCG/ACT inhaler Inhale 2 puffs into the lungs every 6 (six) hours as needed for wheezing or shortness of breath. 18 g Katy Apo, NP     Controlled Substance Prescriptions [place] Controlled Substance Registry consulted? Not Applicable   Katy Apo, NP 10/18/18 2102

## 2018-10-19 LAB — NOVEL CORONAVIRUS, NAA (HOSP ORDER, SEND-OUT TO REF LAB; TAT 18-24 HRS): SARS-CoV-2, NAA: NOT DETECTED

## 2018-10-21 LAB — CULTURE, GROUP A STREP (THRC)

## 2018-11-08 ENCOUNTER — Other Ambulatory Visit: Payer: Self-pay | Admitting: Specialist

## 2018-11-08 ENCOUNTER — Other Ambulatory Visit (HOSPITAL_COMMUNITY): Payer: Self-pay | Admitting: Specialist

## 2018-11-08 DIAGNOSIS — R911 Solitary pulmonary nodule: Secondary | ICD-10-CM

## 2018-11-20 ENCOUNTER — Other Ambulatory Visit: Payer: Self-pay

## 2018-11-20 ENCOUNTER — Ambulatory Visit
Admission: RE | Admit: 2018-11-20 | Discharge: 2018-11-20 | Disposition: A | Discharge status: Home / Self Care | Payer: Medicare Other | Source: Ambulatory Visit | Attending: Specialist | Admitting: Specialist

## 2018-11-20 DIAGNOSIS — R911 Solitary pulmonary nodule: Secondary | ICD-10-CM | POA: Diagnosis not present

## 2018-11-22 ENCOUNTER — Other Ambulatory Visit (HOSPITAL_COMMUNITY): Payer: Self-pay | Admitting: Specialist

## 2018-11-22 ENCOUNTER — Other Ambulatory Visit: Payer: Self-pay | Admitting: Specialist

## 2018-11-22 DIAGNOSIS — R05 Cough: Secondary | ICD-10-CM

## 2018-11-22 DIAGNOSIS — R911 Solitary pulmonary nodule: Secondary | ICD-10-CM

## 2018-11-22 DIAGNOSIS — R058 Other specified cough: Secondary | ICD-10-CM

## 2018-12-03 ENCOUNTER — Encounter
Admission: RE | Admit: 2018-12-03 | Discharge: 2018-12-03 | Disposition: A | Discharge status: Home / Self Care | Payer: Medicare Other | Source: Ambulatory Visit | Attending: Specialist | Admitting: Specialist

## 2018-12-03 ENCOUNTER — Other Ambulatory Visit: Payer: Self-pay

## 2018-12-03 DIAGNOSIS — R590 Localized enlarged lymph nodes: Secondary | ICD-10-CM | POA: Insufficient documentation

## 2018-12-03 DIAGNOSIS — R05 Cough: Secondary | ICD-10-CM | POA: Diagnosis present

## 2018-12-03 DIAGNOSIS — R911 Solitary pulmonary nodule: Secondary | ICD-10-CM | POA: Diagnosis not present

## 2018-12-03 DIAGNOSIS — J439 Emphysema, unspecified: Secondary | ICD-10-CM | POA: Diagnosis not present

## 2018-12-03 DIAGNOSIS — R058 Other specified cough: Secondary | ICD-10-CM

## 2018-12-03 DIAGNOSIS — I2510 Atherosclerotic heart disease of native coronary artery without angina pectoris: Secondary | ICD-10-CM | POA: Diagnosis not present

## 2018-12-03 DIAGNOSIS — R918 Other nonspecific abnormal finding of lung field: Secondary | ICD-10-CM | POA: Insufficient documentation

## 2018-12-03 LAB — GLUCOSE, CAPILLARY: Glucose-Capillary: 100 mg/dL — ABNORMAL HIGH (ref 70–99)

## 2018-12-03 MED ORDER — FLUDEOXYGLUCOSE F - 18 (FDG) INJECTION
9.0000 | Freq: Once | INTRAVENOUS | Status: AC | PRN
  Administered 2018-12-03: 09:00:00 9.63 via INTRAVENOUS

## 2018-12-04 ENCOUNTER — Other Ambulatory Visit: Payer: Self-pay

## 2018-12-04 ENCOUNTER — Ambulatory Visit
Admission: EM | Admit: 2018-12-04 | Discharge: 2018-12-04 | Disposition: A | Discharge status: Home / Self Care | Payer: Medicare Other | Attending: Family Medicine | Admitting: Family Medicine

## 2018-12-04 DIAGNOSIS — J029 Acute pharyngitis, unspecified: Secondary | ICD-10-CM

## 2018-12-04 DIAGNOSIS — R05 Cough: Secondary | ICD-10-CM

## 2018-12-04 DIAGNOSIS — R053 Chronic cough: Secondary | ICD-10-CM

## 2018-12-04 DIAGNOSIS — H9203 Otalgia, bilateral: Secondary | ICD-10-CM | POA: Diagnosis not present

## 2018-12-04 DIAGNOSIS — H6123 Impacted cerumen, bilateral: Secondary | ICD-10-CM | POA: Diagnosis present

## 2018-12-04 DIAGNOSIS — R490 Dysphonia: Secondary | ICD-10-CM | POA: Diagnosis present

## 2018-12-04 LAB — RAPID STREP SCREEN (MED CTR MEBANE ONLY): Streptococcus, Group A Screen (Direct): NEGATIVE

## 2018-12-04 MED ORDER — CHLORASEPTIC MOUTH PAIN 1.4 % MT LIQD: 2.0000 | OROMUCOSAL | 0 refills | Status: DC | PRN

## 2018-12-04 MED ORDER — AMOXICILLIN-POT CLAVULANATE 875-125 MG PO TABS: 1.0000 | ORAL_TABLET | Freq: Two times a day (BID) | ORAL | 0 refills | Status: AC

## 2018-12-04 NOTE — Discharge Instructions (Addendum)
It was very nice seeing you today in clinic. Thank you for entrusting me with your care.   Rest and make sure you are staying hydrated. May use Tylenol and/or Ibuprofen as needed for pain/fever. Use the antibiotics and throat spray as prescribed.   Follow up with Dr. [name] as already scheduled. If your symptoms/condition worsens, please seek follow up care either here or in the ER. Please remember, our [place] providers are "right here with you" when you need [place].   Again, it was my pleasure to take care of you today. Thank you for choosing our clinic. I hope that you start to feel better quickly.   Honor Loh, MSN, APRN, FNP-C, CEN Advanced Practice Provider [facility]

## 2018-12-04 NOTE — ED Triage Notes (Signed)
Patient complains of sore throat that started 5 days. Patient states that she feels like she has a fever blister on the back of her throat. Reports that she had a pet scan yesterday for possible lung cancer.

## 2018-12-05 NOTE — ED Provider Notes (Signed)
[place], [place]   Name: Jennifer Hodges DOB: [birth year] MRN: [id number] CSN: [id number] PCP: [name], MD  Arrival date and time:  12/04/18 1645  Chief Complaint:  Sore Throat   NOTE: Prior to seeing the patient today, I have reviewed the triage nursing documentation and vital signs. Clinical staff has updated patient's PMH/PSHx, current medication list, and drug allergies/intolerances to ensure comprehensive history available to assist in medical decision making.   History:   HPI: Jennifer Hodges is a 73 y.o. female who presents today with complaints of a month month history of sore throat. Patient states, "I just can't get rid of it". She reports an acute exacerbation over the course of the last 5 days. She has been running low grade temperatures that have been in the 99 range. Yesterday, patient appreciated "spots" on her throat and increasing pain in her BILATERAL ears. Patient denies nausea, vomiting, diarrhea, and abdominal pain. She is able to eat and drink normally; denies dysphagia. She denies headaches and facial pain/pressure. Patient has a chronic cough with associated shortness of breath and wheezing. Patient has been unable to utilize her nocturnal PAP therapy due to cough and wheezing.  She had a PET scan earlier this week to assess for underlying malignancy. Patient will meet with pulmonologist [name], MD) on 12/06/2018 to discuss the results of her imaging. She has not been in close contact with anyone known to be ill. She was tested for SARS-CoV-2 (novel coronavirus) last month. Patient has been using APAP and warm salt water gargles, which intermittently improve symptoms.   Past Medical History:  Diagnosis Date   Anemia    Asthma    No Inhalers--Dr. [name] will order as needed   Bronchiectasis ([place])    mild   Chronic headaches     followed by [facility] migraines   COPD (chronic obstructive pulmonary disease) (HCC)    DDD (degenerative disc disease),  lumbar    Diabetes mellitus without complication [facility])    "doctor says I no longer have diabetes"     Diverticulosis    Family history of adverse reaction to anesthesia    PONV   Gall stones    history of   GERD (gastroesophageal reflux disease)    EGD 8/09- non bleeding erosive gastritis, documentd esophageal ulcerations.    Hiatal hernia    History of pneumonia    Hypercholesterolemia    IBS (irritable bowel syndrome)    Murmur    Osteoarthritis    lumbar disc disease, left hip   PONV (postoperative nausea and vomiting)    "Only with last hip and I believe it was due to the morphine"    Sleep apnea    cpap asked to bring mask and tubing   Weakness of right side of body     Past Surgical History:  Procedure Laterality Date   ABDOMINAL HYSTERECTOMY  age 39   ANTERIOR CERVICAL DECOMP/DISCECTOMY FUSION N/A 02/24/2015   Procedure: CERVICAL FOUR-FIVE, CERVICAL FIVE-SIX, CERVICAL SIX-SEVEN ANTERIOR CERVICAL DECOMPRESSION/DISCECTOMY FUSION ;  Surgeon: [name], MD;  Location: [place] [facility];  Service: Neurosurgery;  Laterality: N/A;  C45 C56 C67 anterior cervical decompression with fusion interbody prosthesis plating and bonegraft   APPENDECTOMY     BACK SURGERY     4th lumbar fusion   BLADDER SURGERY N/A    with vaginal wall repair   BREAST CYST ASPIRATION Bilateral    neg   BREAST SURGERY Bilateral    cyst removed and  reduction   CARDIAC CATHETERIZATION  2014   CARPAL TUNNEL RELEASE Right 02/11/2016   Procedure: CARPAL TUNNEL RELEASE;  Surgeon: [name], MD;  Location: [facility];  Service: Orthopedics;  Laterality: Right;   CATARACT EXTRACTION W/ INTRAOCULAR LENS IMPLANT Bilateral 2015   CHOLECYSTECTOMY     EUS N/A 05/31/2012   Procedure: UPPER ENDOSCOPIC ULTRASOUND (EUS) LINEAR;  Surgeon: [name], MD;  Location: [facility];  Service: Endoscopy;  Laterality: N/A;   EXCISIONAL HEMORRHOIDECTOMY     JOINT REPLACEMENT Bilateral     KNEE ARTHROSCOPY WITH LATERAL MENISECTOMY Right 07/07/2015   Procedure: KNEE ARTHROSCOPY WITH LATERAL MENISECTOMY, PARTIAL SYNOVECTOMY;  Surgeon: [name], MD;  Location: [facility];  Service: Orthopedics;  Laterality: Right;   LUMBAR LAMINECTOMY     REDUCTION MAMMAPLASTY  1990   RIGHT OOPHORECTOMY     TOTAL HIP ARTHROPLASTY Left 05/01/2014   Dr. [name]   TOTAL HIP ARTHROPLASTY Right 08/05/2014   Procedure: TOTAL HIP ARTHROPLASTY ANTERIOR APPROACH;  Surgeon: [name], MD;  Location: [facility];  Service: Orthopedics;  Laterality: Right;   ULNAR NERVE TRANSPOSITION Right 02/11/2016   Procedure: ULNAR NERVE DECOMPRESSION/TRANSPOSITION;  Surgeon: [name], MD;  Location: [facility];  Service: Orthopedics;  Laterality: Right;    Family History  Problem Relation Age of Onset   Heart disease Mother        s/p stent   Hypertension Mother    Hypercholesterolemia Mother    Diabetes Father    Stomach cancer Other        uncle   Breast cancer Cousin    Breast cancer Paternal Aunt     Social History   Tobacco Use   Smoking status: Former Smoker    Quit date: 06/13/2007    Years since quitting: 11.4   Smokeless tobacco: Never Used  Substance Use Topics   Alcohol use: No    Alcohol/week: 0.0 standard drinks   Drug use: No    Patient Active Problem List   Diagnosis Date Noted   Knee pain 05/11/2015   Chest tightness 03/24/2015   Stool incontinence 03/24/2015   Urinary frequency 03/04/2015   Cervical spondylosis with radiculopathy 02/24/2015   Pre-syncope 02/19/2015   URI (upper respiratory infection) 02/15/2015   Neck pain 12/25/2014   Pelvic pain in female 11/16/2014   Primary osteoarthritis of hip 08/05/2014   Heel pain 06/01/2014   Diarrhea 06/01/2014   Health care maintenance 06/01/2014   Right leg pain 03/30/2014   Right arm pain 03/30/2014   Sinusitis 03/30/2014   Internal nasal lesion 11/12/2013   Rib pain 08/04/2013    Elevated AFP 04/28/2013   Osteoporosis 11/12/2012   Nonspecific (abnormal) findings on radiological and other examination of gastrointestinal tract 05/31/2012   Bronchiectasis ([place]) 02/05/2012   Sleep apnea 02/05/2012   Degenerative disc disease, cervical 02/05/2012   Hypercholesterolemia 02/05/2012   GERD (gastroesophageal reflux disease) 02/05/2012   Chronic headaches 02/05/2012    Home Medications:    Current Meds  Medication Sig   acetaminophen (TYLENOL) 500 MG tablet Take 500 mg by mouth every 6 (six) hours as needed for mild pain or moderate pain. Reported on 04/06/2015   albuterol (VENTOLIN HFA) 108 (90 Base) MCG/ACT inhaler Inhale 2 puffs into the lungs every 6 (six) hours as needed for wheezing or shortness of breath.   benzonatate (TESSALON) 100 MG capsule Take 1 capsule (100 mg total) by mouth 3 (three) times daily as needed for cough.   cetirizine (ZYRTEC) 5 MG  tablet Take 1 tablet (5 mg total) by mouth daily.   Chromium Picolinate 200 MCG CAPS Take 1 capsule by mouth daily.    Coenzyme Q10 (CO Q 10) 100 MG CAPS Take 100 mg by mouth every morning.    cyclobenzaprine (FLEXERIL) 10 MG tablet Take 10 mg by mouth at bedtime.    estradiol (ESTRACE) 0.1 MG/GM vaginal cream Place 1 Applicatorful vaginally 2 (two) times a week.    folic acid (FOLVITE) 315 MCG tablet Take 400 mcg by mouth daily.   Garlic 1761 MG TBEC Take 2,000 mg by mouth daily.   ipratropium (ATROVENT) 0.06 % nasal spray Place 2 sprays into both nostrils 4 (four) times daily as needed for rhinitis.   Magnesium 250 MG TABS Take 1 tablet by mouth 2 (two) times daily.   pantoprazole (PROTONIX) 40 MG tablet Take 1 tablet (40 mg total) by mouth 2 (two) times daily before a meal.   pyridOXINE (VITAMIN B-6) 100 MG tablet Take 100 mg by mouth daily.   vitamin B-12 (CYANOCOBALAMIN) 1000 MCG tablet Take 1,000 mcg by mouth daily.   vitamin E 400 UNIT capsule Take 400 Units by mouth daily.   Zoledronic  Acid (RECLAST IV) Inject into the vein.    Allergies:   Aspirin, Morphine and related, Adhesive [tape], Clarithromycin, Codeine, Darvon [propoxyphene hcl], Demerol [meperidine], Flonase [fluticasone propionate], Simvastatin, and Talwin [pentazocine]  Review of Systems (ROS): Review of Systems  Constitutional: Positive for fatigue and fever (low grade; running in 99 range). Negative for chills.  HENT: Positive for ear pain, sore throat and voice change (more hoarse). Negative for congestion, postnasal drip, rhinorrhea, sinus pressure, sinus pain, sneezing and trouble swallowing.   Eyes: Negative for pain, discharge and redness.  Respiratory: Positive for cough (chronic), shortness of breath (chronic) and wheezing (chronic). Negative for chest tightness.        PMH (+) COPD  Cardiovascular: Negative for chest pain and palpitations.  Gastrointestinal: Negative for abdominal pain, diarrhea, nausea and vomiting.  Musculoskeletal: Negative for arthralgias, back pain, myalgias and neck pain.  Skin: Negative for color change, pallor and rash.  Neurological: Negative for dizziness, syncope, weakness and headaches.  Hematological: Positive for adenopathy.     Vital Signs: Today's Vitals   12/04/18 1654 12/04/18 1655 12/04/18 1658 12/04/18 1754  BP:   (!) 142/68   Pulse:   91   Resp:   16   Temp:   98.4 F (36.9 C)   TempSrc:   Oral   SpO2:   95%   Weight:  171 lb 15.3 oz (78 kg)    PainSc: 9    9     Physical Exam: Physical Exam  Constitutional: She is oriented to person, place, and time and well-developed, well-nourished, and in no distress.  Non-toxic appearance. No distress.  HENT:  Head: Normocephalic and atraumatic.  Nose: Nose normal.  Mouth/Throat: Uvula is midline and mucous membranes are normal. Oropharyngeal exudate (white and yellow exudative patches) and posterior oropharyngeal erythema present. No posterior oropharyngeal edema.  BILATERAL ears with EACs occluded by  cerumen. Using docusate and Debrox at home without ability to remove. Has been to ENT several times to have ears disimpacted. Unable to assess TMs.   Eyes: Pupils are equal, round, and reactive to light. Conjunctivae and EOM are normal.  Neck: Normal range of motion. Neck supple. No tracheal deviation present.  Cardiovascular: Normal rate, regular rhythm, normal heart sounds and intact distal pulses. Exam reveals no gallop and no  friction rub.  Pulmonary/Chest: Effort normal. No respiratory distress. She has wheezes (scattered expiratory). She has no rales.  Abdominal: Soft. Bowel sounds are normal. She exhibits no distension. There is no abdominal tenderness.  Musculoskeletal: Normal range of motion.  Lymphadenopathy:       Head (right side): Submandibular adenopathy present.       Head (left side): Submandibular adenopathy present.    She has no cervical adenopathy.       Left: Supraclavicular adenopathy present.  Neurological: She is alert and oriented to person, place, and time. Gait normal.  Skin: Skin is warm and dry. No rash noted. She is not diaphoretic.  Psychiatric: Mood, memory, affect and judgment normal.  Nursing note and vitals reviewed.   Urgent Care Treatments / Results:   LABS: PLEASE NOTE: all labs that were ordered this encounter are listed, however only abnormal results are displayed. Labs Reviewed  RAPID STREP SCREEN ([facility] ONLY)  CULTURE, GROUP A STREP [facility])    EKG: -None  RADIOLOGY: No results found.  PROCEDURES: Procedures  MEDICATIONS RECEIVED THIS VISIT: Medications - No data to display  PERTINENT CLINICAL COURSE NOTES/UPDATES:   Initial Impression / Assessment and Plan / Urgent Care Course:  Pertinent labs & imaging results that were available during my care of the patient were personally reviewed by me and considered in my medical decision making (see lab/imaging section of note for values and interpretations).  Jennifer Hodges is a 73  y.o. female who presents to [facility] today with complaints of Sore Throat   Patient is well appearing overall in clinic today. She does not appear to be in any acute distress. Presenting symptoms (see HPI) and exam as documented above. Cough is chronic. She has been tested recently for SARS-CoV-2 (novel coronavirus) per her report. 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 She meets with PCCM later this week to discuss. She remains unaware of the results at this time. Will defer discussion regarding malignancy to Dr [name], however patient states, "he already told me that he is pretty sure that I have cancer".  Regarding her sore throat, there are exudative patches notes. Rapid streptococcal throat swab (-); reflex culture sent. Pain has acutely worsened over the last 2 weeks. She is having pain in her ears as well.   We attempted cerumen disimpaction. We were able to remove a large amount of impacted cerumen from the LEFT ear, however none from the RIGHT despite the efforts with what we have in the office to use (currettes bending due to hard wax). Patient uses docusate and Debrox at home, thus these interventions were deferred in office as they were likely to be unsuccessful. Discussed follow up with ENT for further evaluation and treatment.   Given sore throat, ear pain, and cough will treat with a 10 day course of Augmentin. Will also send in a prescription for phenol 1.4% spray to help with the associated pain. Discussed supportive care measures at home. Patient to rest as much as possible. She was encouraged to ensure adequate hydration (water and ORS) to prevent dehydration and electrolyte derangements. Patient may use APAP and/or IBU on an as needed basis for pain/fever.   I have reviewed the follow up and strict return precautions for any new or worsening symptoms. Patient is aware of symptoms that would be deemed urgent/emergent, and would thus  require further evaluation either here or in the emergency department. At the time of  discharge, she verbalized understanding and consent with the discharge plan as it was reviewed with her. All questions were fielded by provider and/or clinic staff prior to patient discharge.    Final Clinical Impressions / Urgent Care Diagnoses:   Final diagnoses:  Exudative pharyngitis  Bilateral impacted cerumen  Cough    New Prescriptions:  [place] Controlled Substance Registry consulted? Not Applicable  Meds ordered this encounter  Medications   amoxicillin-clavulanate (AUGMENTIN) 875-125 MG tablet    Sig: Take 1 tablet by mouth 2 (two) times daily for 10 days.    Dispense:  20 tablet    Refill:  0   phenol (CHLORASEPTIC MOUTH PAIN) 1.4 % LIQD    Sig: Use as directed 2 sprays in the mouth or throat as needed for throat irritation / pain.    Dispense:  177 mL    Refill:  0    Recommended Follow up Care:  Patient encouraged to follow up with the following provider within the specified time frame, or sooner as dictated by the severity of her symptoms. As always, she was instructed that for any urgent/emergent care needs, she should seek care either here or in the emergency department for more immediate evaluation.  NOTE: This note was prepared using Lobbyist along with smaller Company secretary. Despite my best ability to proofread, there is the potential that transcriptional errors may still occur from this process, and are completely unintentional.    Karen Kitchens, NP 12/05/18 1419

## 2018-12-07 LAB — CULTURE, GROUP A STREP (THRC)

## 2018-12-09 ENCOUNTER — Encounter: Payer: Self-pay | Admitting: Emergency Medicine

## 2018-12-09 ENCOUNTER — Ambulatory Visit
Admission: EM | Admit: 2018-12-09 | Discharge: 2018-12-09 | Disposition: A | Discharge status: Home / Self Care | Payer: Medicare Other | Attending: Family Medicine | Admitting: Family Medicine

## 2018-12-09 ENCOUNTER — Other Ambulatory Visit: Payer: Self-pay

## 2018-12-09 DIAGNOSIS — J039 Acute tonsillitis, unspecified: Secondary | ICD-10-CM | POA: Diagnosis not present

## 2018-12-09 MED ORDER — HYDROCODONE-ACETAMINOPHEN 5-325 MG PO TABS: ORAL_TABLET | ORAL | 0 refills | Status: DC

## 2018-12-09 NOTE — ED Provider Notes (Signed)
[facility]    CSN: [id number] Arrival date & time: 12/09/18  0856      History   Chief Complaint Chief Complaint  Patient presents with  . Sore Throat    HPI Jennifer Hodges is a 73 y.o. female.   73 yo female seen here 5 days ago and had testing done for sore throat and cough (patient informed of all test results including chest x-ray), presents for follow up on her sore throat. States that overall she seems to be getting better on the antibiotic (augmentin), however last night felt "like my throat was swollen" and still having pain.  Denies any fevers, chills, trouble swallowing or shortness of breath.    Sore Throat    Past Medical History:  Diagnosis Date  . Anemia   . Asthma    No Inhalers--Dr. [name] will order as needed  . Bronchiectasis (HCC)    mild  . Chronic headaches     followed by [facility] migraines  . COPD (chronic obstructive pulmonary disease) ([place])   . DDD (degenerative disc disease), lumbar   . Diabetes mellitus without complication [facility])    "doctor says I no longer have diabetes"    . Diverticulosis   . Family history of adverse reaction to anesthesia    PONV  . Gall stones    history of  . GERD (gastroesophageal reflux disease)    EGD 8/09- non bleeding erosive gastritis, documentd esophageal ulcerations.   . Hiatal hernia   . History of pneumonia   . Hypercholesterolemia   . IBS (irritable bowel syndrome)   . Murmur   . Osteoarthritis    lumbar disc disease, left hip  . PONV (postoperative nausea and vomiting)    "Only with last hip and I believe it was due to the morphine"   . Sleep apnea    cpap asked to bring mask and tubing  . Weakness of right side of body     Patient Active Problem List   Diagnosis Date Noted  . Knee pain 05/11/2015  . Chest tightness 03/24/2015  . Stool incontinence 03/24/2015  . Urinary frequency 03/04/2015  . Cervical spondylosis with radiculopathy 02/24/2015  . Pre-syncope 02/19/2015   . URI (upper respiratory infection) 02/15/2015  . Neck pain 12/25/2014  . Pelvic pain in female 11/16/2014  . Primary osteoarthritis of hip 08/05/2014  . Heel pain 06/01/2014  . Diarrhea 06/01/2014  . Health care maintenance 06/01/2014  . Right leg pain 03/30/2014  . Right arm pain 03/30/2014  . Sinusitis 03/30/2014  . Internal nasal lesion 11/12/2013  . Rib pain 08/04/2013  . Elevated AFP 04/28/2013  . Osteoporosis 11/12/2012  . Nonspecific (abnormal) findings on radiological and other examination of gastrointestinal tract 05/31/2012  . Bronchiectasis ([place]) 02/05/2012  . Sleep apnea 02/05/2012  . Degenerative disc disease, cervical 02/05/2012  . Hypercholesterolemia 02/05/2012  . GERD (gastroesophageal reflux disease) 02/05/2012  . Chronic headaches 02/05/2012    Past Surgical History:  Procedure Laterality Date  . ABDOMINAL HYSTERECTOMY  age 48  . ANTERIOR CERVICAL DECOMP/DISCECTOMY FUSION N/A 02/24/2015   Procedure: CERVICAL FOUR-FIVE, CERVICAL FIVE-SIX, CERVICAL SIX-SEVEN ANTERIOR CERVICAL DECOMPRESSION/DISCECTOMY FUSION ;  Surgeon: [name], MD;  Location: [facility];  Service: Neurosurgery;  Laterality: N/A;  C45 C56 C67 anterior cervical decompression with fusion interbody prosthesis plating and bonegraft  . APPENDECTOMY    . BACK SURGERY     4th lumbar fusion  . BLADDER SURGERY N/A    with vaginal  wall repair  . BREAST CYST ASPIRATION Bilateral    neg  . BREAST SURGERY Bilateral    cyst removed and reduction  . CARDIAC CATHETERIZATION  2014  . CARPAL TUNNEL RELEASE Right 02/11/2016   Procedure: CARPAL TUNNEL RELEASE;  Surgeon: [name], MD;  Location: [facility];  Service: Orthopedics;  Laterality: Right;  . CATARACT EXTRACTION W/ INTRAOCULAR LENS IMPLANT Bilateral 2015  . CHOLECYSTECTOMY    . EUS N/A 05/31/2012   Procedure: UPPER ENDOSCOPIC ULTRASOUND (EUS) LINEAR;  Surgeon: [name], MD;  Location: [facility];  Service: Endoscopy;   Laterality: N/A;  . EXCISIONAL HEMORRHOIDECTOMY    . JOINT REPLACEMENT Bilateral   . KNEE ARTHROSCOPY WITH LATERAL MENISECTOMY Right 07/07/2015   Procedure: KNEE ARTHROSCOPY WITH LATERAL MENISECTOMY, PARTIAL SYNOVECTOMY;  Surgeon: [name], MD;  Location: [facility];  Service: Orthopedics;  Laterality: Right;  . LUMBAR LAMINECTOMY    . REDUCTION MAMMAPLASTY  1990  . RIGHT OOPHORECTOMY    . TOTAL HIP ARTHROPLASTY Left 05/01/2014   Dr. [name]  . TOTAL HIP ARTHROPLASTY Right 08/05/2014   Procedure: TOTAL HIP ARTHROPLASTY ANTERIOR APPROACH;  Surgeon: [name], MD;  Location: [facility];  Service: Orthopedics;  Laterality: Right;  . ULNAR NERVE TRANSPOSITION Right 02/11/2016   Procedure: ULNAR NERVE DECOMPRESSION/TRANSPOSITION;  Surgeon: [name], MD;  Location: [facility];  Service: Orthopedics;  Laterality: Right;    OB History   No obstetric history on file.      Home Medications    Prior to Admission medications   Medication Sig Start Date End Date Taking? Authorizing Provider  acetaminophen (TYLENOL) 500 MG tablet Take 500 mg by mouth every 6 (six) hours as needed for mild pain or moderate pain. Reported on 04/06/2015   Yes [provider]  albuterol (VENTOLIN HFA) 108 (90 Base) MCG/ACT inhaler Inhale 2 puffs into the lungs every 6 (six) hours as needed for wheezing or shortness of breath. 10/18/18  Yes Katy Apo, NP  amoxicillin-clavulanate (AUGMENTIN) 875-125 MG tablet Take 1 tablet by mouth 2 (two) times daily for 10 days. 12/04/18 12/14/18 Yes Karen Kitchens, NP  benzonatate (TESSALON) 100 MG capsule Take 1 capsule (100 mg total) by mouth 3 (three) times daily as needed for cough. 10/18/18  Yes Amyot, Nicholes Stairs, NP  cetirizine (ZYRTEC) 5 MG tablet Take 1 tablet (5 mg total) by mouth daily. 04/17/18  Yes Cook, Jayce G, DO  Chromium Picolinate 200 MCG CAPS Take 1 capsule by mouth daily.    Yes [provider]  Coenzyme Q10 (CO Q 10) 100 MG CAPS Take 100 mg  by mouth every morning.    Yes [provider]  cyclobenzaprine (FLEXERIL) 10 MG tablet Take 10 mg by mouth at bedtime.    Yes [provider]  estradiol (ESTRACE) 0.1 MG/GM vaginal cream Place 1 Applicatorful vaginally 2 (two) times a week.    Yes [provider]  folic acid (FOLVITE) 631 MCG tablet Take 400 mcg by mouth daily.   Yes [provider]  Garlic 4970 MG TBEC Take 2,000 mg by mouth daily.   Yes [provider]  ipratropium (ATROVENT) 0.06 % nasal spray Place 2 sprays into both nostrils 4 (four) times daily as needed for rhinitis. 04/17/18  Yes Cook, Jayce G, DO  Magnesium 250 MG TABS Take 1 tablet by mouth 2 (two) times daily.   Yes [provider]  montelukast (SINGULAIR) 10 MG tablet  11/22/18  Yes [provider]  pantoprazole (PROTONIX) 40 MG tablet Take 1 tablet (40 mg total) by mouth 2 (two) times daily before a meal. 03/24/15  Yes Einar Pheasant, MD  phenol (CHLORASEPTIC MOUTH PAIN) 1.4 % LIQD Use as directed 2 sprays in the mouth or throat as needed for throat irritation / pain. 12/04/18  Yes Karen Kitchens, NP  pyridOXINE (VITAMIN B-6) 100 MG tablet Take 100 mg by mouth daily.   Yes [provider]  vitamin B-12 (CYANOCOBALAMIN) 1000 MCG tablet Take 1,000 mcg by mouth daily.   Yes [provider]  vitamin E 400 UNIT capsule Take 400 Units by mouth daily.   Yes [provider]  Zoledronic Acid (RECLAST IV) Inject into the vein.   Yes [provider]  HYDROcodone-acetaminophen (NORCO/VICODIN) 5-325 MG tablet 1-2 tabs po bid prn 12/09/18   Norval Gable, MD    Family History Family History  Problem Relation Age of Onset  . Heart disease Mother        s/p stent  . Hypertension Mother   . Hypercholesterolemia Mother   . Diabetes Father   . Stomach cancer Other        uncle  . Breast cancer Cousin   . Breast cancer Paternal Aunt     Social History Social History   Tobacco Use   . Smoking status: Former Smoker    Quit date: 06/13/2007    Years since quitting: 11.4  . Smokeless tobacco: Never Used  Substance Use Topics  . Alcohol use: No    Alcohol/week: 0.0 standard drinks  . Drug use: No     Allergies   Aspirin, Morphine and related, Adhesive [tape], Clarithromycin, Codeine, Darvon [propoxyphene hcl], Demerol [meperidine], Flonase [fluticasone propionate], Simvastatin, and Talwin [pentazocine]   Review of Systems Review of Systems   Physical Exam Triage Vital Signs ED Triage Vitals  Enc Vitals Group     BP 12/09/18 0925 115/60     Pulse Rate 12/09/18 0925 85     Resp 12/09/18 0925 20     Temp 12/09/18 0925 98.4 F (36.9 C)     Temp Source 12/09/18 0925 Oral     SpO2 12/09/18 0925 95 %     Weight 12/09/18 0922 171 lb 15.3 oz (78 kg)     Height --      Head Circumference --      Peak Flow --      Pain Score 12/09/18 0922 7     Pain Loc --      Pain Edu? --      Excl. in [place]? --    No data found.  Updated Vital Signs BP 115/60 (BP Location: Left Arm)   Pulse 85   Temp 98.4 F (36.9 C) (Oral)   Resp 20   Wt 78 kg   SpO2 95%   BMI 32.49 kg/m   Visual Acuity Right Eye Distance:   Left Eye Distance:   Bilateral Distance:    Right Eye Near:   Left Eye Near:    Bilateral Near:     Physical Exam Vitals signs and nursing note reviewed.  Constitutional:      General: She is not in acute distress.    Appearance: She is not toxic-appearing or diaphoretic.  HENT:     Mouth/Throat:     Pharynx: Posterior oropharyngeal erythema present. No oropharyngeal exudate.     Tonsils: No tonsillar exudate or tonsillar abscesses. 1+ on the right. 1+ on the left.  Cardiovascular:  Rate and Rhythm: Normal rate.     Pulses: Normal pulses.  Pulmonary:     Effort: Pulmonary effort is normal. No respiratory distress.     Breath sounds: Normal breath sounds. No stridor. No wheezing, rhonchi or rales.  Neurological:     Mental Status: She is  alert.      UC Treatments / Results  Labs (all labs ordered are listed, but only abnormal results are displayed) Labs Reviewed - No data to display  EKG   Radiology No results found.  Procedures Procedures (including critical care time)  Medications Ordered in UC Medications - No data to display  Initial Impression / Assessment and Plan / UC Course  I have reviewed the triage vital signs and the nursing notes.  Pertinent labs & imaging results that were available during my care of the patient were reviewed by me and considered in my medical decision making (see chart for details).      Final Clinical Impressions(s) / UC Diagnoses   Final diagnoses:  Tonsillitis  (improving)   Discharge Instructions     Continue current antibiotic Continue salt water gargles    ED Prescriptions    Medication Sig Dispense Auth. Provider   HYDROcodone-acetaminophen (NORCO/VICODIN) 5-325 MG tablet 1-2 tabs po bid prn 10 tablet Norval Gable, MD      1. diagnosis reviewed with patient 2. rx as per orders above; reviewed possible side effects, interactions, risks and benefits  3. Recommend continue other current treatment  4. Follow-up prn if symptoms worsen or don't improve  I have reviewed the PDMP during this encounter.   Norval Gable, MD 12/09/18 [phone]

## 2018-12-09 NOTE — ED Triage Notes (Signed)
Pt c/o sore throat. She was seen on 12/04/18 for this and states that her throat is more swollen than it was, but the white patches are better. She is still taking the augmentin. She states that her throat kept her up last night and it is hard to breath. Strep, strep culture and COVID testing all negative.

## 2018-12-09 NOTE — Discharge Instructions (Signed)
Continue current antibiotic Continue salt water gargles

## 2018-12-14 ENCOUNTER — Other Ambulatory Visit: Payer: Self-pay

## 2018-12-14 ENCOUNTER — Other Ambulatory Visit
Admission: RE | Admit: 2018-12-14 | Discharge: 2018-12-14 | Disposition: A | Discharge status: Home / Self Care | Payer: Medicare Other | Source: Ambulatory Visit | Attending: Pulmonary Disease | Admitting: Pulmonary Disease

## 2018-12-14 DIAGNOSIS — Z20828 Contact with and (suspected) exposure to other viral communicable diseases: Secondary | ICD-10-CM | POA: Insufficient documentation

## 2018-12-14 DIAGNOSIS — Z01812 Encounter for preprocedural laboratory examination: Secondary | ICD-10-CM | POA: Diagnosis present

## 2018-12-14 LAB — SARS CORONAVIRUS 2 (TAT 6-24 HRS): SARS Coronavirus 2: NEGATIVE

## 2018-12-19 ENCOUNTER — Encounter
Admission: RE | Disposition: A | Discharge status: Home / Self Care | Payer: Self-pay | Source: Home / Self Care | Attending: Pulmonary Disease

## 2018-12-19 ENCOUNTER — Ambulatory Visit: Payer: Medicare Other | Admitting: Anesthesiology

## 2018-12-19 ENCOUNTER — Ambulatory Visit
Admission: RE | Admit: 2018-12-19 | Discharge: 2018-12-19 | Disposition: A | Discharge status: Home / Self Care | Payer: Medicare Other | Attending: Pulmonary Disease | Admitting: Pulmonary Disease

## 2018-12-19 ENCOUNTER — Other Ambulatory Visit (HOSPITAL_COMMUNITY): Payer: Self-pay | Admitting: Pulmonary Disease

## 2018-12-19 ENCOUNTER — Ambulatory Visit
Admission: RE | Admit: 2018-12-19 | Discharge: 2018-12-19 | Disposition: A | Discharge status: Home / Self Care | Payer: Medicare Other | Source: Ambulatory Visit | Attending: Pulmonary Disease | Admitting: Pulmonary Disease

## 2018-12-19 ENCOUNTER — Other Ambulatory Visit: Payer: Self-pay | Admitting: Pulmonary Disease

## 2018-12-19 ENCOUNTER — Other Ambulatory Visit: Payer: Self-pay

## 2018-12-19 ENCOUNTER — Ambulatory Visit: Payer: Medicare Other

## 2018-12-19 DIAGNOSIS — M1612 Unilateral primary osteoarthritis, left hip: Secondary | ICD-10-CM | POA: Diagnosis not present

## 2018-12-19 DIAGNOSIS — Z961 Presence of intraocular lens: Secondary | ICD-10-CM | POA: Insufficient documentation

## 2018-12-19 DIAGNOSIS — I2510 Atherosclerotic heart disease of native coronary artery without angina pectoris: Secondary | ICD-10-CM | POA: Diagnosis not present

## 2018-12-19 DIAGNOSIS — Z800 Family history of malignant neoplasm of digestive organs: Secondary | ICD-10-CM | POA: Insufficient documentation

## 2018-12-19 DIAGNOSIS — C3412 Malignant neoplasm of upper lobe, left bronchus or lung: Secondary | ICD-10-CM | POA: Insufficient documentation

## 2018-12-19 DIAGNOSIS — Z885 Allergy status to narcotic agent status: Secondary | ICD-10-CM | POA: Insufficient documentation

## 2018-12-19 DIAGNOSIS — K449 Diaphragmatic hernia without obstruction or gangrene: Secondary | ICD-10-CM | POA: Insufficient documentation

## 2018-12-19 DIAGNOSIS — R918 Other nonspecific abnormal finding of lung field: Secondary | ICD-10-CM

## 2018-12-19 DIAGNOSIS — Z9842 Cataract extraction status, left eye: Secondary | ICD-10-CM | POA: Diagnosis not present

## 2018-12-19 DIAGNOSIS — Z886 Allergy status to analgesic agent status: Secondary | ICD-10-CM | POA: Insufficient documentation

## 2018-12-19 DIAGNOSIS — Z87891 Personal history of nicotine dependence: Secondary | ICD-10-CM | POA: Insufficient documentation

## 2018-12-19 DIAGNOSIS — Z9841 Cataract extraction status, right eye: Secondary | ICD-10-CM | POA: Insufficient documentation

## 2018-12-19 DIAGNOSIS — G894 Chronic pain syndrome: Secondary | ICD-10-CM | POA: Diagnosis not present

## 2018-12-19 DIAGNOSIS — D649 Anemia, unspecified: Secondary | ICD-10-CM | POA: Diagnosis not present

## 2018-12-19 DIAGNOSIS — Z803 Family history of malignant neoplasm of breast: Secondary | ICD-10-CM | POA: Insufficient documentation

## 2018-12-19 DIAGNOSIS — K219 Gastro-esophageal reflux disease without esophagitis: Secondary | ICD-10-CM | POA: Insufficient documentation

## 2018-12-19 DIAGNOSIS — G4730 Sleep apnea, unspecified: Secondary | ICD-10-CM | POA: Insufficient documentation

## 2018-12-19 DIAGNOSIS — E7800 Pure hypercholesterolemia, unspecified: Secondary | ICD-10-CM | POA: Insufficient documentation

## 2018-12-19 DIAGNOSIS — Z90710 Acquired absence of both cervix and uterus: Secondary | ICD-10-CM | POA: Diagnosis not present

## 2018-12-19 DIAGNOSIS — R519 Headache, unspecified: Secondary | ICD-10-CM | POA: Diagnosis not present

## 2018-12-19 DIAGNOSIS — K589 Irritable bowel syndrome without diarrhea: Secondary | ICD-10-CM | POA: Insufficient documentation

## 2018-12-19 DIAGNOSIS — Z981 Arthrodesis status: Secondary | ICD-10-CM | POA: Insufficient documentation

## 2018-12-19 DIAGNOSIS — M5136 Other intervertebral disc degeneration, lumbar region: Secondary | ICD-10-CM | POA: Insufficient documentation

## 2018-12-19 DIAGNOSIS — I700 Atherosclerosis of aorta: Secondary | ICD-10-CM | POA: Diagnosis not present

## 2018-12-19 DIAGNOSIS — R011 Cardiac murmur, unspecified: Secondary | ICD-10-CM | POA: Diagnosis not present

## 2018-12-19 DIAGNOSIS — Z8249 Family history of ischemic heart disease and other diseases of the circulatory system: Secondary | ICD-10-CM | POA: Insufficient documentation

## 2018-12-19 DIAGNOSIS — Z833 Family history of diabetes mellitus: Secondary | ICD-10-CM | POA: Insufficient documentation

## 2018-12-19 DIAGNOSIS — J449 Chronic obstructive pulmonary disease, unspecified: Secondary | ICD-10-CM | POA: Insufficient documentation

## 2018-12-19 DIAGNOSIS — Z91048 Other nonmedicinal substance allergy status: Secondary | ICD-10-CM | POA: Insufficient documentation

## 2018-12-19 DIAGNOSIS — Z96643 Presence of artificial hip joint, bilateral: Secondary | ICD-10-CM | POA: Diagnosis not present

## 2018-12-19 DIAGNOSIS — J45909 Unspecified asthma, uncomplicated: Secondary | ICD-10-CM | POA: Diagnosis not present

## 2018-12-19 DIAGNOSIS — R590 Localized enlarged lymph nodes: Secondary | ICD-10-CM | POA: Diagnosis not present

## 2018-12-19 DIAGNOSIS — Z888 Allergy status to other drugs, medicaments and biological substances status: Secondary | ICD-10-CM | POA: Insufficient documentation

## 2018-12-19 HISTORY — PX: VIDEO BRONCHOSCOPY WITH ENDOBRONCHIAL ULTRASOUND: SHX6177

## 2018-12-19 LAB — BASIC METABOLIC PANEL
Anion gap: 9 (ref 5–15)
BUN: 8 mg/dL (ref 8–23)
CO2: 26 mmol/L (ref 22–32)
Calcium: 9.2 mg/dL (ref 8.9–10.3)
Chloride: 105 mmol/L (ref 98–111)
Creatinine, Ser: 0.51 mg/dL (ref 0.44–1.00)
GFR calc Af Amer: >60 mL/min (ref >60)
GFR calc non Af Amer: >60 mL/min (ref >60)
Glucose, Bld: 103 mg/dL — ABNORMAL HIGH (ref 70–99)
Potassium: 4.3 mmol/L (ref 3.5–5.1)
Sodium: 140 mmol/L (ref 135–145)

## 2018-12-19 LAB — CBC
HCT: 39.7 % (ref 36.0–46.0)
Hemoglobin: 13.4 g/dL (ref 12.0–15.0)
MCH: 30.5 pg (ref 26.0–34.0)
MCHC: 33.8 g/dL (ref 30.0–36.0)
MCV: 90.2 fL (ref 80.0–100.0)
Platelets: 242 10*3/uL (ref 150–400)
RBC: 4.4 MIL/uL (ref 3.87–5.11)
RDW: 12.4 % (ref 11.5–15.5)
WBC: 9.1 10*3/uL (ref 4.0–10.5)
nRBC: 0 % (ref 0.0–0.2)

## 2018-12-19 LAB — GLUCOSE, CAPILLARY: Glucose-Capillary: 89 mg/dL (ref 70–99)

## 2018-12-19 LAB — PROTIME-INR
INR: 1 (ref 0.8–1.2)
Prothrombin Time: 13 seconds (ref 11.4–15.2)

## 2018-12-19 SURGERY — BRONCHOSCOPY, WITH EBUS
Anesthesia: General | Laterality: Left

## 2018-12-19 MED ORDER — FENTANYL CITRATE (PF) 100 MCG/2ML IJ SOLN: 25.0000 ug | INTRAMUSCULAR | Status: DC | PRN

## 2018-12-19 MED ORDER — SEVOFLURANE IN SOLN
RESPIRATORY_TRACT | Status: AC
  Filled 2018-12-19: qty 250

## 2018-12-19 MED ORDER — SUCCINYLCHOLINE CHLORIDE 20 MG/ML IJ SOLN
INTRAMUSCULAR | Status: DC | PRN
  Administered 2018-12-19: 100 mg via INTRAVENOUS

## 2018-12-19 MED ORDER — FENTANYL CITRATE (PF) 100 MCG/2ML IJ SOLN
INTRAMUSCULAR | Status: AC
  Filled 2018-12-19: qty 2

## 2018-12-19 MED ORDER — PHENYLEPHRINE HCL 0.25 % NA SOLN
1.0000 | Freq: Four times a day (QID) | NASAL | Status: DC | PRN
  Filled 2018-12-19: qty 15

## 2018-12-19 MED ORDER — LIDOCAINE HCL (PF) 2 % IJ SOLN
INTRAMUSCULAR | Status: AC
  Filled 2018-12-19: qty 10

## 2018-12-19 MED ORDER — PROPOFOL 10 MG/ML IV BOLUS
INTRAVENOUS | Status: DC | PRN
  Administered 2018-12-19: 150 mg via INTRAVENOUS

## 2018-12-19 MED ORDER — BUTAMBEN-TETRACAINE-BENZOCAINE 2-2-14 % EX AERO
1.0000 | INHALATION_SPRAY | Freq: Once | CUTANEOUS | Status: DC
  Filled 2018-12-19: qty 20

## 2018-12-19 MED ORDER — FENTANYL CITRATE (PF) 100 MCG/2ML IJ SOLN
INTRAMUSCULAR | Status: DC | PRN
  Administered 2018-12-19 (×2): 25 ug via INTRAVENOUS

## 2018-12-19 MED ORDER — SUCCINYLCHOLINE CHLORIDE 20 MG/ML IJ SOLN
INTRAMUSCULAR | Status: AC
  Filled 2018-12-19: qty 1

## 2018-12-19 MED ORDER — LIDOCAINE HCL URETHRAL/MUCOSAL 2 % EX GEL
1.0000 "application " | Freq: Once | CUTANEOUS | Status: DC
  Filled 2018-12-19: qty 5

## 2018-12-19 MED ORDER — SUGAMMADEX SODIUM 200 MG/2ML IV SOLN
INTRAVENOUS | Status: DC | PRN
  Administered 2018-12-19: 160 mg via INTRAVENOUS

## 2018-12-19 MED ORDER — ROCURONIUM BROMIDE 100 MG/10ML IV SOLN
INTRAVENOUS | Status: DC | PRN
  Administered 2018-12-19: 5 mg via INTRAVENOUS
  Administered 2018-12-19: 15 mg via INTRAVENOUS

## 2018-12-19 MED ORDER — ONDANSETRON HCL 4 MG/2ML IJ SOLN
INTRAMUSCULAR | Status: DC | PRN
  Administered 2018-12-19: 4 mg via INTRAVENOUS

## 2018-12-19 MED ORDER — PROPOFOL 10 MG/ML IV BOLUS
INTRAVENOUS | Status: AC
  Filled 2018-12-19: qty 20

## 2018-12-19 MED ORDER — LACTATED RINGERS IV SOLN
INTRAVENOUS | Status: DC
  Administered 2018-12-19: 50 mL/h via INTRAVENOUS

## 2018-12-19 MED ORDER — ROCURONIUM BROMIDE 50 MG/5ML IV SOLN
INTRAVENOUS | Status: AC
  Filled 2018-12-19: qty 1

## 2018-12-19 MED ORDER — ONDANSETRON HCL 4 MG/2ML IJ SOLN: 4.0000 mg | Freq: Once | INTRAMUSCULAR | Status: DC | PRN

## 2018-12-19 MED ORDER — SUGAMMADEX SODIUM 200 MG/2ML IV SOLN
INTRAVENOUS | Status: AC
  Filled 2018-12-19: qty 2

## 2018-12-19 MED ORDER — ONDANSETRON HCL 4 MG/2ML IJ SOLN
INTRAMUSCULAR | Status: AC
  Filled 2018-12-19: qty 2

## 2018-12-19 MED ORDER — LIDOCAINE HCL (CARDIAC) PF 100 MG/5ML IV SOSY
PREFILLED_SYRINGE | INTRAVENOUS | Status: DC | PRN
  Administered 2018-12-19: 60 mg via INTRAVENOUS

## 2018-12-19 MED ORDER — DEXAMETHASONE SODIUM PHOSPHATE 10 MG/ML IJ SOLN
INTRAMUSCULAR | Status: AC
  Filled 2018-12-19: qty 1

## 2018-12-19 MED ORDER — DEXAMETHASONE SODIUM PHOSPHATE 10 MG/ML IJ SOLN
INTRAMUSCULAR | Status: DC | PRN
  Administered 2018-12-19: 4 mg via INTRAVENOUS

## 2018-12-19 NOTE — H&P (Signed)
Pulmonary Medicine          Date: 12/19/2018,   MRN# [id number] ARIZONA SORN 1946/03/08     AdmissionWeight: 78 kg                 CurrentWeight: 78 kg      CHIEF COMPLAINT:   Lingular spiculated nodule with hilar lymphadenopathy   HISTORY OF PRESENT ILLNESS   This is a pleasant 73 year old female with a history of asthma and bronchiectasis as well as COPD, degenerative disc disease, diabetes, chronic pain syndrome reports chronic pain with migraines as well as joint pains abdominal pain and lower extremity pain, history of dyslipidemia, IBS and sleep apnea, who has been followed by Dr. [name] for the past 20 years in pulmonary clinic and [facility].  She reports that over the last year her dyspnea is gotten worse and had undergone CT chest showing multiple bilateral nodules with spiculated over 2 cm lingular nodule as well as hilar lymphadenopathy and status post PET CT showing avid right paratracheal and hilar lymphadenopathy as well as lingular avid lesion concerning for primary bronchogenic carcinoma.  Patient was referred to me for tissue diagnosis.  We had discussed this with patient and she is agreeable, due to peripheral location of lesion we had decided to undergo electromagnetic navigational bronchoscopy as well as EBUS assisted lymph node biopsies for staging.  Patient has not had any antiplatelet agents or anticoagulation in the last 10 days.  All questions regarding procedure have been answered and complications are outlined as below.   PAST MEDICAL HISTORY   Past Medical History:  Diagnosis Date   Anemia    Asthma    No Inhalers--Dr. [name] will order as needed   Bronchiectasis ([place])    mild   Chronic headaches     followed by [facility] migraines   COPD (chronic obstructive pulmonary disease) (HCC)    DDD (degenerative disc disease), lumbar    Diabetes mellitus without complication [facility])    "doctor says I no longer have diabetes"      Diverticulosis    Family history of adverse reaction to anesthesia    PONV   Gall stones    history of   GERD (gastroesophageal reflux disease)    EGD 8/09- non bleeding erosive gastritis, documentd esophageal ulcerations.    Hiatal hernia    History of pneumonia    Hypercholesterolemia    IBS (irritable bowel syndrome)    Murmur    Osteoarthritis    lumbar disc disease, left hip   PONV (postoperative nausea and vomiting)    "Only with last hip and I believe it was due to the morphine"    Sleep apnea    cpap asked to bring mask and tubing   Weakness of right side of body      SURGICAL HISTORY   Past Surgical History:  Procedure Laterality Date   ABDOMINAL HYSTERECTOMY  age 36   ANTERIOR CERVICAL DECOMP/DISCECTOMY FUSION N/A 02/24/2015   Procedure: CERVICAL FOUR-FIVE, CERVICAL FIVE-SIX, CERVICAL SIX-SEVEN ANTERIOR CERVICAL DECOMPRESSION/DISCECTOMY FUSION ;  Surgeon: [name], MD;  Location: [facility];  Service: Neurosurgery;  Laterality: N/A;  C45 C56 C67 anterior cervical decompression with fusion interbody prosthesis plating and bonegraft   APPENDECTOMY     BACK SURGERY     4th lumbar fusion   BLADDER SURGERY N/A    with vaginal wall repair   BREAST CYST ASPIRATION Bilateral    neg   BREAST SURGERY  Bilateral    cyst removed and reduction   CARDIAC CATHETERIZATION  2014   CARPAL TUNNEL RELEASE Right 02/11/2016   Procedure: CARPAL TUNNEL RELEASE;  Surgeon: [name], MD;  Location: [facility];  Service: Orthopedics;  Laterality: Right;   CATARACT EXTRACTION W/ INTRAOCULAR LENS IMPLANT Bilateral 2015   CHOLECYSTECTOMY     EUS N/A 05/31/2012   Procedure: UPPER ENDOSCOPIC ULTRASOUND (EUS) LINEAR;  Surgeon: [name], MD;  Location: [facility];  Service: Endoscopy;  Laterality: N/A;   EXCISIONAL HEMORRHOIDECTOMY     JOINT REPLACEMENT Bilateral    KNEE ARTHROSCOPY WITH LATERAL MENISECTOMY Right 07/07/2015   Procedure: KNEE  ARTHROSCOPY WITH LATERAL MENISECTOMY, PARTIAL SYNOVECTOMY;  Surgeon: [name], MD;  Location: [facility];  Service: Orthopedics;  Laterality: Right;   LUMBAR LAMINECTOMY     REDUCTION MAMMAPLASTY  1990   RIGHT OOPHORECTOMY     TOTAL HIP ARTHROPLASTY Left 05/01/2014   Dr. [name]   TOTAL HIP ARTHROPLASTY Right 08/05/2014   Procedure: TOTAL HIP ARTHROPLASTY ANTERIOR APPROACH;  Surgeon: [name], MD;  Location: [facility];  Service: Orthopedics;  Laterality: Right;   ULNAR NERVE TRANSPOSITION Right 02/11/2016   Procedure: ULNAR NERVE DECOMPRESSION/TRANSPOSITION;  Surgeon: [name], MD;  Location: [facility];  Service: Orthopedics;  Laterality: Right;     FAMILY HISTORY   Family History  Problem Relation Age of Onset   Heart disease Mother        s/p stent   Hypertension Mother    Hypercholesterolemia Mother    Diabetes Father    Stomach cancer Other        uncle   Breast cancer Cousin    Breast cancer Paternal Aunt      SOCIAL HISTORY   Social History   Tobacco Use   Smoking status: Former Smoker    Quit date: 06/13/2007    Years since quitting: 11.5   Smokeless tobacco: Never Used  Substance Use Topics   Alcohol use: No    Alcohol/week: 0.0 standard drinks   Drug use: No     MEDICATIONS    Home Medication:    Current Medication:  Current Facility-Administered Medications:    butamben-tetracaine-benzocaine (CETACAINE) spray 1 spray, 1 spray, Topical, Once, Atarah Cadogan, MD   lactated ringers infusion, , Intravenous, Continuous, Martha Clan, MD   lidocaine (XYLOCAINE) 2 % jelly 1 application, 1 application, Topical, Once, Mithcell Schumpert, MD   phenylephrine (NEO-SYNEPHRINE) 0.25 % nasal spray 1 spray, 1 spray, Each Nare, Q6H PRN, [name], MD    ALLERGIES   Aspirin, Morphine and related, Adhesive [tape], Clarithromycin, Codeine, Darvon [propoxyphene hcl], Demerol [meperidine], Flonase [fluticasone propionate],  Simvastatin, and Talwin [pentazocine]     REVIEW OF SYSTEMS    Review of Systems:  Gen:  Denies  fever, sweats, chills weigh loss  HEENT: Denies blurred vision, double vision, ear pain, eye pain, hearing loss, nose bleeds, sore throat Cardiac:  No dizziness, chest pain or heaviness, chest tightness,edema Resp:   Denies cough or sputum porduction, shortness of breath,wheezing, hemoptysis,  Gi: Denies swallowing difficulty, stomach pain, nausea or vomiting, diarrhea, constipation, bowel incontinence Gu:  Denies bladder incontinence, burning urine Ext:   Denies Joint pain, stiffness or swelling Skin: Denies  skin rash, easy bruising or bleeding or hives Endoc:  Denies polyuria, polydipsia , polyphagia or weight change Psych:   Denies depression, insomnia or hallucinations   Other:  All other systems negative   VS: BP 126/68    Pulse 84  Temp 98.8 F (37.1 C) (Tympanic)    Resp 16    Ht 5\' 1"  (1.549 m)    Wt 78 kg    SpO2 95%    BMI 32.49 kg/m      PHYSICAL EXAM    GENERAL:NAD, no fevers, chills, no weakness no fatigue HEAD: Normocephalic, atraumatic.  EYES: Pupils equal, round, reactive to light. Extraocular muscles intact. No scleral icterus.  MOUTH: Moist mucosal membrane. Dentition intact. No abscess noted.  EAR, NOSE, THROAT: Clear without exudates. No external lesions.  NECK: Supple. No thyromegaly. No nodules. No JVD.  PULMONARY: Mild bilateral rhonchorous breath sounds CARDIOVASCULAR: S1 and S2. Regular rate and rhythm. No murmurs, rubs, or gallops. No edema. Pedal pulses 2+ bilaterally.  GASTROINTESTINAL: Soft, nontender, nondistended. No masses. Positive bowel sounds. No hepatosplenomegaly.  MUSCULOSKELETAL: No swelling, clubbing, or edema. Range of motion full in all extremities.  NEUROLOGIC: Cranial nerves II through XII are intact. No gross focal neurological deficits. Sensation intact. Reflexes intact.  SKIN: No ulceration, lesions, rashes, or cyanosis. Skin  warm and dry. Turgor intact.  PSYCHIATRIC: Mood, affect within normal limits. The patient is awake, alert and oriented x 3. Insight, judgment intact.       IMAGING    Ct Chest Wo Contrast  Result Date: 11/20/2018 CLINICAL DATA:  73 year old female with history of pneumonia and bronchitis with hemoptysis and productive cough. Wheezing and congestion for the past 1-2 months. EXAM: CT CHEST WITHOUT CONTRAST TECHNIQUE: Multidetector CT imaging of the chest was performed following the standard protocol without IV contrast. COMPARISON:  Chest CT 07/16/2007. FINDINGS: Cardiovascular: Heart size is normal. There is no significant pericardial fluid, thickening or pericardial calcification. There is aortic atherosclerosis, as well as atherosclerosis of the great vessels of the mediastinum and the coronary arteries, including calcified atherosclerotic plaque in the left anterior descending and left circumflex coronary arteries. Mediastinum/Nodes: Enlarged AP window lymph node measuring 1.8 cm in short axis. Enlarged prevascular lymph nodes measuring up to 1.9 cm in short axis. No definite hilar lymphadenopathy confidently identified on today's noncontrast CT examination. Esophagus is unremarkable in appearance. No axillary lymphadenopathy. Lungs/Pleura: In the medial aspect of the left upper lobe (axial image 50 of series 3 and coronal image 60 of series 5) there is a 2.0 x 1.6 x 1.8 cm macrolobulated nodule with slightly spiculated margins in contact with the pleura medially, highly concerning for primary bronchogenic neoplasm. Multiple other small pulmonary nodules are noted throughout the lungs bilaterally, concerning for metastatic disease, largest of which is in the right lower lobe (axial image 97 of series 3) measuring 8 mm. No acute consolidative airspace disease. No pleural effusions. Mild diffuse bronchial wall thickening with mild centrilobular and paraseptal emphysema. Upper Abdomen: Aortic  atherosclerosis. Musculoskeletal: Orthopedic fixation hardware in the lower cervical spine incidentally noted. There are no aggressive appearing lytic or blastic lesions noted in the visualized portions of the skeleton. IMPRESSION: 1. Multiple pulmonary nodules in the lungs bilaterally. Findings could reflect a primary left upper lobe bronchogenic carcinoma with metastatic disease to the lungs, or findings may simply all be reflective of metastasis. This is associated with mediastinal lymphadenopathy, as above. Further evaluation with PET-CT is suggested in the near future for additional diagnostic evaluation and staging purposes. 2. Aortic atherosclerosis, in addition to 2 vessel coronary artery disease. Assessment for potential risk factor modification, dietary therapy or pharmacologic therapy may be warranted, if clinically indicated. 3. Mild diffuse bronchial wall thickening with mild centrilobular and paraseptal emphysema; imaging findings  suggestive of underlying COPD. These results will be called to the ordering clinician or representative by the Radiologist Assistant, and communication documented in the PACS or zVision Dashboard. Aortic Atherosclerosis (ICD10-I70.0) and Emphysema (ICD10-J43.9). Electronically Signed   By: Vinnie Langton M.D.   On: 11/20/2018 08:41   Nm Pet Image Initial (pi) Skull Base To Thigh  Result Date: 12/03/2018 CLINICAL DATA:  Initial treatment strategy for pulmonary nodule. EXAM: NUCLEAR MEDICINE PET SKULL BASE TO THIGH TECHNIQUE: 9.6 mCi F-18 FDG was injected intravenously. Full-ring PET imaging was performed from the skull base to thigh after the radiotracer. CT data was obtained and used for attenuation correction and anatomic localization. Fasting blood glucose: 100 mg/dl COMPARISON:  Chest CT from 11/20/2018 FINDINGS: Mediastinal blood pool activity: SUV max 2.6 Liver activity: SUV max 3.8 NECK: Physiologic muscular activity along the right deltoid muscle left lower neck  strap musculature. Incidental CT findings: Bilateral common carotid atherosclerotic calcification. CHEST: A left upper lobe nodule abutting the mediastinum measures 1.9 by 1.2 cm on image 84/3 with maximum SUV 10.7. Conglomerate AP window adenopathy measuring 5.8 by 2.1 cm on image 80/3, maximum SUV 14.2. Left hilar adenopathy noted with a posterior left hilar lymph node measuring SUV max 12.8, and a smaller anterior hilar node with maximum SUV of 7.6. Left supraclavicular node 0.7 cm in short axis on image 58/3, maximum SUV 4.6. Faintly hypermetabolic right lower paratracheal right pericardial lymph nodes. Subtle A 7 mm pulmonary nodule in the posterior basal segment right lower lobe on image 104/3 is faintly accentuated metabolic activity, maximum SUV 2.7. Other nodules in this vicinity likely also show minimal accentuated metabolic activity. Some of the nodules in the lungs are not clearly hypermetabolic but are below sensitive PET-CT size thresholds. Incidental CT findings: Coronary, aortic arch, and branch vessel atherosclerotic vascular disease. Centrilobular emphysema. ABDOMEN/PELVIS: Minimal accentuation of activity lower rectum, probably physiologic. Mild focal accentuation of activity the hepatic flexure without associated CT abnormality, maximum SUV 5.1. Mild accentuation of activity in the descending colon focally, maximum SUV 5.0. Incidental CT findings: Aortoiliac atherosclerotic vascular disease. SKELETON: No significant abnormal hypermetabolic activity in this region. Incidental CT findings: Bilateral total hip prostheses. Lower cervical plate and screw fixator. IMPRESSION: 1. Hypermetabolic dominant left upper lobe nodule with hypermetabolic left hilar and hypermetabolic conglomerate AP window adenopathy compatible with active malignancy. 2. Low-grade hypermetabolic activity in a left supraclavicular lymph node and in right lower paratracheal and right pericardial lymph nodes, suspicious for but not  absolutely diagnostic of metastatic activity. 3. One of the larger right lower lobe pulmonary nodules has some faintly accentuated metabolic activity, maximum SUV of 2.7. Some of the lung nodules are technically too small to characterize and not measurably hypermetabolic. Mildly accentuated activity focally in the hepatic flexure and distal descending colon, probably physiologic. Correlate with colon screening history in determining whether colonoscopy is warranted. 4. Other imaging findings of potential clinical significance: Aortic Atherosclerosis (ICD10-I70.0) and Emphysema (ICD10-J43.9). Coronary atherosclerosis. Electronically Signed   By: Van Clines M.D.   On: 12/03/2018 10:49   Ct Super D Chest Wo Contrast  Result Date: 12/19/2018 CLINICAL DATA:  73 year old female with history of cough and shortness of breath for the past 2-3 months. EXAM: CT CHEST WITHOUT CONTRAST TECHNIQUE: Multidetector CT imaging of the chest was performed using thin slice collimation for electromagnetic bronchoscopy planning purposes, without intravenous contrast. COMPARISON:  Chest CT 11/20/2018.  PET-CT 12/03/2018. FINDINGS: Cardiovascular: Heart size is normal. There is no significant pericardial fluid, thickening or  pericardial calcification. There is aortic atherosclerosis, as well as atherosclerosis of the great vessels of the mediastinum and the coronary arteries, including calcified atherosclerotic plaque in the left anterior descending, left circumflex and right coronary arteries. Mediastinum/Nodes: Worsening mediastinal lymphadenopathy. Specific examples include a 1.9 cm short axis AP window lymph node (axial image 20 of series 2), previously 1.8 cm. Bulky prevascular lymph nodes measuring 2.0 x 5.9 cm (axial image 18 of series 2), previously 1.9 x 5.0 cm. Borderline enlarged low right paratracheal lymph node measuring 1.2 cm in short axis (previously only 7 mm). Esophagus is unremarkable in appearance. No axillary  lymphadenopathy Lungs/Pleura: Multiple pulmonary nodules are noted in the lungs bilaterally, most of which appear increased in size compared to the prior examination. The largest of these is in the medial aspect of the left upper lobe (axial image 20 of series 2 and coronal image 32 of series 6) measuring 2.1 x 1.7 x 2.3 cm, previously 2.0 x 1.7 x 1.8 cm. Clustered pulmonary nodules in the right lower lobe generally appear increased in size compared to the prior examination, best demonstrated by 9 mm right lower lobe nodule (axial image 40 of series 3), which previously measured 8 mm, and 8 mm right lower lobe nodule (axial image 45 of series 3), previously 6 mm). No acute consolidative airspace disease. No pleural effusions. Upper Abdomen: Aortic atherosclerosis. Musculoskeletal: Orthopedic fixation hardware in the lower cervical spine incidentally noted. There are no aggressive appearing lytic or blastic lesions noted in the visualized portions of the skeleton. IMPRESSION: 1. Progression of disease as evidenced by enlargement of previously noted pulmonary nodules in the lungs bilaterally, as well as progressive mediastinal lymphadenopathy, as detailed above. 2. Aortic atherosclerosis, in addition to two vessel coronary artery disease. Assessment for potential risk factor modification, dietary therapy or pharmacologic therapy may be warranted, if clinically indicated. Aortic Atherosclerosis (ICD10-I70.0). Electronically Signed   By: Vinnie Langton M.D.   On: 12/19/2018 10:18      ASSESSMENT/PLAN   Left lung PET avid spiculated lesion concerning for bronchogenic carcinoma with hilar adenopathy         -Patient evaluated bedside in preoperative area         -Laterality of procedure is been marked on both sides         -Complications of procedure include, pneumothorax/pneumomediastinum that may require chest tube placement and mechanical ventilation, infection, bleeding, and rarely death patient agrees and  would like to proceed         -Details of procedure been discussed with patient all questions have been answered         -Reviewed CT chest as above.     Thank you for allowing me to participate in the care of this patient.   Patient/Family are satisfied with care plan and all questions have been answered.  This document was prepared using Dragon voice recognition software and may include unintentional dictation errors.     [name], M.D.  Division of [place]

## 2018-12-19 NOTE — Anesthesia Procedure Notes (Signed)
Procedure Name: Intubation Date/Time: 12/19/2018 1:25 PM Performed by: Dionne Bucy, CRNA Pre-anesthesia Checklist: Patient identified, Patient being monitored, Timeout performed, Emergency Drugs available and Suction available Patient Re-evaluated:Patient Re-evaluated prior to induction Oxygen Delivery Method: Circle system utilized Preoxygenation: Pre-oxygenation with 100% oxygen Induction Type: IV induction Ventilation: Mask ventilation without difficulty Laryngoscope Size: 3 and McGraph Grade View: Grade I Tube type: Oral Tube size: 8.5 mm Number of attempts: 1 Airway Equipment and Method: Stylet and Video-laryngoscopy Placement Confirmation: ETT inserted through vocal cords under direct vision,  positive ETCO2 and breath sounds checked- equal and bilateral Secured at: 21 cm Tube secured with: Tape Dental Injury: Teeth and Oropharynx as per pre-operative assessment  Difficulty Due To: Difficulty was anticipated and Difficult Airway- due to reduced neck mobility

## 2018-12-19 NOTE — Anesthesia Post-op Follow-up Note (Signed)
Anesthesia QCDR form completed.        

## 2018-12-19 NOTE — Anesthesia Preprocedure Evaluation (Signed)
Anesthesia Evaluation  Patient identified by MRN, date of birth, ID band Patient awake    Reviewed: Allergy & Precautions, H&P , NPO status , Patient's Chart, lab work & pertinent test results, reviewed documented beta blocker date and time   History of Anesthesia Complications (+) PONV, Family history of anesthesia reaction and history of anesthetic complications  Airway Mallampati: III   Neck ROM: full    Dental  (+) Teeth Intact   Pulmonary neg pulmonary ROS, asthma , sleep apnea , COPD,  COPD inhaler, former smoker,      + stridor     Cardiovascular Exercise Tolerance: Poor negative cardio ROS Normal cardiovascular exam+ Valvular Problems/Murmurs AI  Rhythm:regular Rate:Normal     Neuro/Psych  Headaches, negative neurological ROS  negative psych ROS   GI/Hepatic negative GI ROS, Neg liver ROS, hiatal hernia, GERD  Medicated,  Endo/Other  negative endocrine ROSdiabetes, Well Controlled, Type 2, Oral Hypoglycemic Agents  Renal/GU negative Renal ROS  negative genitourinary   Musculoskeletal   Abdominal   Peds  Hematology negative hematology ROS (+) Blood dyscrasia, anemia ,   Anesthesia Other Findings Past Medical History: No date: Anemia No date: Asthma     Comment:  No Inhalers--Dr. [name] will order as needed No date: Bronchiectasis ([place])     Comment:  mild No date: Chronic headaches     Comment:   followed by [facility] migraines No date: COPD (chronic obstructive pulmonary disease) (HCC) No date: DDD (degenerative disc disease), lumbar No date: Diabetes mellitus without complication ([place])     Comment:  "doctor says I no longer have diabetes"   No date: Diverticulosis No date: Family history of adverse reaction to anesthesia     Comment:  PONV No date: Gall stones     Comment:  history of No date: GERD (gastroesophageal reflux disease)     Comment:  EGD 8/09- non bleeding erosive gastritis,  documentd               esophageal ulcerations.  No date: Hiatal hernia No date: History of pneumonia No date: Hypercholesterolemia No date: IBS (irritable bowel syndrome) No date: Murmur No date: Osteoarthritis     Comment:  lumbar disc disease, left hip No date: PONV (postoperative nausea and vomiting)     Comment:  "Only with last hip and I believe it was due to the               morphine"  No date: Sleep apnea     Comment:  cpap asked to bring mask and tubing No date: Weakness of right side of body Past Surgical History: age 73: ABDOMINAL HYSTERECTOMY 02/24/2015: ANTERIOR CERVICAL DECOMP/DISCECTOMY FUSION; N/A     Comment:  Procedure: CERVICAL FOUR-FIVE, CERVICAL FIVE-SIX,               CERVICAL SIX-SEVEN ANTERIOR CERVICAL               DECOMPRESSION/DISCECTOMY FUSION ;  Surgeon: [name], MD;  Location: [facility];  Service:               Neurosurgery;  Laterality: N/A;  C45 C56 C67 anterior               cervical decompression with fusion interbody prosthesis               plating and bonegraft No date: APPENDECTOMY No date: BACK SURGERY  Comment:  4th lumbar fusion No date: BLADDER SURGERY; N/A     Comment:  with vaginal wall repair No date: BREAST CYST ASPIRATION; Bilateral     Comment:  neg No date: BREAST SURGERY; Bilateral     Comment:  cyst removed and reduction 2014: CARDIAC CATHETERIZATION 02/11/2016: CARPAL TUNNEL RELEASE; Right     Comment:  Procedure: CARPAL TUNNEL RELEASE;  Surgeon: [name], MD;  Location: [facility];  Service: Orthopedics;                Laterality: Right; 2015: CATARACT EXTRACTION W/ INTRAOCULAR LENS IMPLANT; Bilateral No date: CHOLECYSTECTOMY 05/31/2012: EUS; N/A     Comment:  Procedure: UPPER ENDOSCOPIC ULTRASOUND (EUS) LINEAR;                Surgeon: [name], MD;  Location: [facility];                Service: Endoscopy;  Laterality: N/A; No date: EXCISIONAL HEMORRHOIDECTOMY No  date: JOINT REPLACEMENT; Bilateral 07/07/2015: KNEE ARTHROSCOPY WITH LATERAL MENISECTOMY; Right     Comment:  Procedure: KNEE ARTHROSCOPY WITH LATERAL MENISECTOMY,               PARTIAL SYNOVECTOMY;  Surgeon: [name], MD;                Location: [facility];  Service: Orthopedics;  Laterality:               Right; No date: LUMBAR LAMINECTOMY 1990: REDUCTION MAMMAPLASTY No date: RIGHT OOPHORECTOMY 05/01/2014: TOTAL HIP ARTHROPLASTY; Left     Comment:  Dr. [name] 08/05/2014: TOTAL HIP ARTHROPLASTY; Right     Comment:  Procedure: TOTAL HIP ARTHROPLASTY ANTERIOR APPROACH;                Surgeon: [name], MD;  Location: [facility];  Service:              Orthopedics;  Laterality: Right; 02/11/2016: ULNAR NERVE TRANSPOSITION; Right     Comment:  Procedure: ULNAR NERVE DECOMPRESSION/TRANSPOSITION;                Surgeon: [name], MD;  Location: [facility];  Service:              Orthopedics;  Laterality: Right; BMI    Body Mass Index: 32.49 kg/m     Reproductive/Obstetrics negative OB ROS                             Anesthesia Physical Anesthesia Plan  ASA: III  Anesthesia Plan: General and General ETT   Post-op Pain Management:    Induction:   PONV Risk Score and Plan:   Airway Management Planned:   Additional Equipment:   Intra-op Plan:   Post-operative Plan:   Informed Consent: I have reviewed the patients History and Physical, chart, labs and discussed the procedure including the risks, benefits and alternatives for the proposed anesthesia with the patient or authorized representative who has indicated his/her understanding and acceptance.     Dental Advisory Given  Plan Discussed with: CRNA  Anesthesia Plan Comments: (Potential need for postop ventilatory support discussed with pt. And accepted.  JA)        Anesthesia Quick Evaluation

## 2018-12-19 NOTE — Procedures (Addendum)
Endobronchial ultrasound procedure note  Electromagnetic navigational bronchoscopy procedure note  Fiberoptic bronchoscopy with bronchoalveolar lavage procedure note  Flexible bronchoscopy was performed on 12/19/18  by : Jennifer Gins MD  assistance by : 1) Jennifer Hodges RT and 2) Jennifer Hodges [facility] staff 3) anesthesia team 4) [employer] staff Jennifer Hodges   Indication for the procedure was :  Pre-procedural H&P. The following assessment was performed on the day of the procedure prior to initiating sedation History:  Chest pain n Dyspnea y Hemoptysis n Cough y Fever n Other pertinent items y - chronic pain syndrome  Examination Vital signs -reviewed as per nursing documentation today Cardiac    Murmurs: n  Rubs : n  Gallop: n Lungs Wheezing: y Rales : y Rhonchi {:y  Other pertinent findings: n   Pre-procedural assessment for Procedural Sedation included: Depth of sedation: As per anesthesia team  ASA Classification:  3 Mallampati airway assessment: 3    Medication list reviewed: y  The patient's interval history was taken and revealed: no new complaints The pre- procedure physical examination revealed: No new findings Refer to prior clinic note for details.  Informed Consent: Informed consent was obtained from:  patient after explanation of procedure and risks, benefits, as well as alternative procedures available.  Explanation of level of sedation and possible transfusion was also provided.    Procedural Preparation: Time out was performed and patient was identified by name and birthdate and procedure to be performed and side for sampling, if any, was specified. Pt was intubated by anesthesia.  The patient was appropriately draped.  Procedure Findings: Bronchoscope was inserted via ETT  without difficulty.  Posterior oropharynx, epiglottis, arytenoids, false cords and vocal cords were not visualized as these were bypassed by endotracheal tube.   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  The distal trachea was normal in circumference and appearance without mucosal, cartilaginous or branching abnormalities.  The main carina was mildly splayed.All right and left lobar airways were visualized to the Subsegmental level.  Sub- sub segmental carinae were identified in all the distal airways.  Secretions were visible in the following airways and appeared to be thickened clearish phlegm.  The mucosa was : Mildly friable  Airways were notable for:        exophytic lesions :no       extrinsic compression in the following distributions: none.       Friable mucosa: Yes worse at left upper lobe       Anthrocotic material /pigmentation: None   Pictorial documentation attached: N/a  Fiberoptic bronchoscopy with BAL   -Full airway inspection was performed with multiple segments noted to have impaction with mucoid phlegm which was evacuated via suctioning.  Left upper lobe BAL was performed with approximately 90 mL infusion and 40 mL's aspirated return with serosanguineous appearance.   Electro magnetic navigational bronchoscopy Post planning and registration phase Jennifer Hodges was advanced to left upper lobe approximately 1.5 cm away from lesion and multiple forcep biopsies were obtained.  Lesional cells were not definitively visualized by [facility] staff however some atypical cells were noted.   Endobronchial ultrasound assisted lymph node biopsies The fiberoptic bronchoscope was removed and the EBUS scope was introduced. Examination began to evaluate for pathologically enlarged lymph nodes starting on the right side progressing to the left.  Station 10 R was initially evaluated to be 0.8 cm and was not biopsied, next station 4R was evaluated and was noted to be 1.9 cm this  was biopsied 4 times, next station 7 was evaluated under ultrasound and found to be 1.4 cm and multiple biopsies were obtained, with lesional cells being noted by [facility]  staff, next station 4 L was evaluated and was also found to be 0.8 cm which was not biopsied, next station 10 L was evaluated under ultrasound and found to be approximately 1.8 cm this was biopsied 4 times and noted to have lesional cells present by [facility] staff.  All lymph node biopsies performed with 21g needle. Lymph node biopsies were sent in cytolite for all stations stations.   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   Specimens obtained included:  Broncho-alveolar lavage site: Left upper lobe sent for cytology                             72m volume infused 447mvolume returned with serosanguineous appearance  Endobronchial biopsy site: Left upper lobe; sent for cytology            Fluoroscopy Used: 19m79m        Pictorial documentation attached: None              Immediate sampling complications included: None  Epinephrine 0 ml was used topically  The bronchoscopy was terminated due to completion of the planned procedure and the bronchoscope was removed.   Total dosage of Lidocaine was 0 mg Total fluoroscopy time was 6 minutes   Estimated Blood loss: 2cc.  Complications included: None immediate    Disposition: Home with family  Follow up with Jennifer Hodges 5 days for result discussion.     Jennifer Hodges  [facility]ennifer Hodges of Pulmonary [facility] Critical Care Medicine

## 2018-12-19 NOTE — Transfer of Care (Signed)
Immediate Anesthesia Transfer of Care Note  Patient: Jennifer Hodges  Procedure(s) Performed: VIDEO BRONCHOSCOPY WITH ENDOBRONCHIAL ULTRASOUND, LEFT, SLEEP APNEA (Left )  Patient Location: PACU  Anesthesia Type:General  Level of Consciousness: sedated  Airway & Oxygen Therapy: Patient Spontanous Breathing and Patient connected to face mask oxygen  Post-op Assessment: Report given to RN and Post -op Vital signs reviewed and stable  Post vital signs: Reviewed and stable  Last Vitals:  Vitals Value Taken Time  BP 147/74 12/19/18 1550  Temp 35.6 C 12/19/18 1549  Pulse 92 12/19/18 1552  Resp 20 12/19/18 1552  SpO2 100 % 12/19/18 1552  Vitals shown include unvalidated device data.  Last Pain:  Vitals:   12/19/18 1549  TempSrc:   PainSc: 0-No pain         Complications: No apparent anesthesia complications

## 2018-12-19 NOTE — Discharge Instructions (Addendum)
° ° ° ° ° ° °  Flexible Bronchoscopy, Care After This sheet gives you information about how to care for yourself after your test. Your doctor may also give you more specific instructions. If you have problems or questions, contact your doctor. Follow these instructions at home: Eating and drinking  Do not eat or drink anything (not even water) for 2 hours after your test, or until your numbing medicine (local anesthetic) wears off.  When your numbness is gone and your cough and gag reflexes have come back, you may: ? Eat only soft foods. ? Slowly drink liquids.  The day after the test, go back to your normal diet. Driving  Do not drive for 24 hours if you were given a medicine to help you relax (sedative).  Do not drive or use heavy machinery while taking prescription pain medicine. General instructions   Take over-the-counter and prescription medicines only as told by your doctor.  Return to your normal activities as told. Ask what activities are safe for you.  Do not use any products that have nicotine or tobacco in them. This includes cigarettes and e-cigarettes. If you need help quitting, ask your doctor.  Keep all follow-up visits as told by your doctor. This is important. It is very important if you had a tissue sample (biopsy) taken. Get help right away if:  You have shortness of breath that gets worse.  You get light-headed.  You feel like you are going to pass out (faint).  You have chest pain.  You cough up: ? More than a little blood. ? More blood than before. Summary  Do not eat or drink anything (not even water) for 2 hours after your test, or until your numbing medicine wears off.  Do not use cigarettes. Do not use e-cigarettes.  Get help right away if you have chest pain. This information is not intended to replace advice given to you by your health care provider. Make sure you discuss any questions you have with your health care provider. Document  Released: 12/26/2008 Document Revised: 02/10/2017 Document Reviewed: 03/18/2016 Elsevier Patient Education  2020 [place]   1) The drugs that you were given will stay in your system until tomorrow so for the next 24 hours you should not:  A) Drive an automobile B) Make any legal decisions C) Drink any alcoholic beverage   2) You may resume regular meals tomorrow.  Today it is better to start with liquids and gradually work up to solid foods.  You may eat anything you prefer, but it is better to start with liquids, then soup and crackers, and gradually work up to solid foods.   3) Please notify your doctor immediately if you have any unusual bleeding, trouble breathing, redness and pain at the surgery site, drainage, fever, or pain not relieved by medication.    4) Additional Instructions:        Please contact your physician with any problems or Same Day Surgery at [phone], Monday through Friday 6 am to 4 pm, or [place] at [facility] number at [phone].

## 2018-12-20 ENCOUNTER — Encounter: Payer: Self-pay | Admitting: Pulmonary Disease

## 2018-12-24 ENCOUNTER — Encounter: Payer: Self-pay | Admitting: *Deleted

## 2018-12-24 ENCOUNTER — Other Ambulatory Visit: Payer: Self-pay | Admitting: Pathology

## 2018-12-24 LAB — SURGICAL PATHOLOGY

## 2018-12-24 LAB — CYTOLOGY - NON PAP

## 2018-12-25 NOTE — Progress Notes (Signed)
  Oncology Nurse Navigator Documentation  Navigator Location: [facility] (12/25/18 1400) Referral Date to RadOnc/MedOnc: 12/24/18 (12/25/18 1400) )Navigator Encounter Type: Introductory Phone Call (12/25/18 1400)   Abnormal Finding Date: 11/20/18 (12/25/18 1400) Confirmed Diagnosis Date: 12/24/18 (12/25/18 1400)                 Treatment Phase: Pre-Tx/Tx Discussion (12/25/18 1400) Barriers/Navigation Needs: Coordination of Care (12/25/18 1400)   Interventions: Coordination of Care (12/25/18 1400)   Coordination of Care: Appts (12/25/18 1400)        Acuity: Level 2-Minimal Needs (1-2 Barriers Identified) (12/25/18 1400)    phone call made to patient to introduce to navigator services and review upcoming appts. Biopsy results reviewed with patient and informed at her appt with Dr. Janese Banks treatment options will be discussed. All questions answered during phone call. Pt verbalized understanding. Nothing further needed at this time.     Time Spent with Patient: 30 (12/25/18 1400)

## 2018-12-26 ENCOUNTER — Other Ambulatory Visit: Payer: Self-pay

## 2018-12-26 NOTE — Anesthesia Postprocedure Evaluation (Signed)
Anesthesia Post Note  Patient: Jennifer Hodges  Procedure(s) Performed: VIDEO BRONCHOSCOPY WITH ENDOBRONCHIAL ULTRASOUND, LEFT, SLEEP APNEA (Left )  Patient location during evaluation: PACU Anesthesia Type: General Level of consciousness: awake and alert Pain management: pain level controlled Vital Signs Assessment: post-procedure vital signs reviewed and stable Respiratory status: spontaneous breathing, nonlabored ventilation, respiratory function stable and patient connected to nasal cannula oxygen Cardiovascular status: blood pressure returned to baseline and stable Postop Assessment: no apparent nausea or vomiting Anesthetic complications: no     Last Vitals:  Vitals:   12/19/18 1619 12/19/18 1631  BP: (!) 136/59 (!) 145/68  Pulse: 93 93  Resp: 15 16  Temp: (!) 36.2 C 36.7 C  SpO2: 92% 93%    Last Pain:  Vitals:   12/21/18 0758  TempSrc:   PainSc: 2                  Molli Barrows

## 2018-12-27 ENCOUNTER — Other Ambulatory Visit: Payer: Self-pay

## 2018-12-27 ENCOUNTER — Inpatient Hospital Stay: Payer: Medicare Other

## 2018-12-27 ENCOUNTER — Inpatient Hospital Stay (HOSPITAL_BASED_OUTPATIENT_CLINIC_OR_DEPARTMENT_OTHER): Payer: Medicare Other | Admitting: Oncology

## 2018-12-27 ENCOUNTER — Encounter: Payer: Self-pay | Admitting: *Deleted

## 2018-12-27 ENCOUNTER — Encounter: Payer: Self-pay | Admitting: Oncology

## 2018-12-27 ENCOUNTER — Inpatient Hospital Stay: Payer: Medicare Other | Attending: Oncology

## 2018-12-27 VITALS — BP 149/72 | HR 93 | Temp 97.6°F | Resp 20 | Ht 62.0 in | Wt 171.5 lb

## 2018-12-27 DIAGNOSIS — R490 Dysphonia: Secondary | ICD-10-CM

## 2018-12-27 DIAGNOSIS — J449 Chronic obstructive pulmonary disease, unspecified: Secondary | ICD-10-CM | POA: Diagnosis not present

## 2018-12-27 DIAGNOSIS — E119 Type 2 diabetes mellitus without complications: Secondary | ICD-10-CM

## 2018-12-27 DIAGNOSIS — Z7189 Other specified counseling: Secondary | ICD-10-CM

## 2018-12-27 DIAGNOSIS — C3412 Malignant neoplasm of upper lobe, left bronchus or lung: Secondary | ICD-10-CM

## 2018-12-27 HISTORY — DX: Malignant neoplasm of upper lobe, left bronchus or lung: C34.12

## 2018-12-27 LAB — COMPREHENSIVE METABOLIC PANEL
ALT: 17 U/L (ref 0–44)
AST: 15 U/L (ref 15–41)
Albumin: 4.5 g/dL (ref 3.5–5.0)
Alkaline Phosphatase: 79 U/L (ref 38–126)
Anion gap: 8 (ref 5–15)
BUN: 8 mg/dL (ref 8–23)
CO2: 27 mmol/L (ref 22–32)
Calcium: 9.4 mg/dL (ref 8.9–10.3)
Chloride: 104 mmol/L (ref 98–111)
Creatinine, Ser: 0.45 mg/dL (ref 0.44–1.00)
GFR calc Af Amer: >60 mL/min (ref >60)
GFR calc non Af Amer: >60 mL/min (ref >60)
Glucose, Bld: 105 mg/dL — ABNORMAL HIGH (ref 70–99)
Potassium: 4.4 mmol/L (ref 3.5–5.1)
Sodium: 139 mmol/L (ref 135–145)
Total Bilirubin: 0.4 mg/dL (ref 0.3–1.2)
Total Protein: 7 g/dL (ref 6.5–8.1)

## 2018-12-27 LAB — CBC WITH DIFFERENTIAL/PLATELET
Abs Immature Granulocytes: 0.04 10*3/uL (ref 0.00–0.07)
Basophils Absolute: 0 10*3/uL (ref 0.0–0.1)
Basophils Relative: 0 %
Eosinophils Absolute: 0.8 10*3/uL — ABNORMAL HIGH (ref 0.0–0.5)
Eosinophils Relative: 8 %
HCT: 40.9 % (ref 36.0–46.0)
Hemoglobin: 13.4 g/dL (ref 12.0–15.0)
Immature Granulocytes: 0 %
Lymphocytes Relative: 16 %
Lymphs Abs: 1.5 10*3/uL (ref 0.7–4.0)
MCH: 29.9 pg (ref 26.0–34.0)
MCHC: 32.8 g/dL (ref 30.0–36.0)
MCV: 91.3 fL (ref 80.0–100.0)
Monocytes Absolute: 0.5 10*3/uL (ref 0.1–1.0)
Monocytes Relative: 5 %
Neutro Abs: 6.5 10*3/uL (ref 1.7–7.7)
Neutrophils Relative %: 71 %
Platelets: 245 10*3/uL (ref 150–400)
RBC: 4.48 MIL/uL (ref 3.87–5.11)
RDW: 12.4 % (ref 11.5–15.5)
WBC: 9.4 10*3/uL (ref 4.0–10.5)
nRBC: 0 % (ref 0.0–0.2)

## 2018-12-27 NOTE — Progress Notes (Signed)
  Oncology Nurse Navigator Documentation  Navigator Location: [facility] (12/27/18 1300)   )Navigator Encounter Type: Initial MedOnc (12/27/18 1300)                         Barriers/Navigation Needs: Coordination of Care;Education (12/27/18 1300) Education: Newly Diagnosed Cancer Education;Understanding Cancer/ Treatment Options (12/27/18 1300) Interventions: Coordination of Care;Education (12/27/18 1300)   Coordination of Care: Appts;Chemo;Radiology (12/27/18 1300) Education Method: Verbal;Written (12/27/18 1300)     met with patient during initial med-onc consultation with Dr. Janese Banks. All questions answered during visit. Pt given resources regarding diagnosis/treatment options as well as supportive services that are available. Reviewed upcoming appts. Contact info given and instructed to call with any further questions or needs. Pt verbalized understanding. Nothing further needed at this time.           Time Spent with Patient: 60 (12/27/18 1300)

## 2018-12-27 NOTE — Progress Notes (Signed)
Tumor Board Documentation  Jennifer Hodges was presented by Dr Janese Banks at our Tumor Board on 12/27/2018, which included representatives from medical oncology, radiation oncology, pathology, radiology, surgical, internal medicine, navigation, pharmacy, genetics, pulmonology, palliative care, research.  Jennifer Hodges currently presents as a new patient, for Jennifer Hodges, for new positive pathology with history of the following treatments: surgical intervention(s), active survellience.  Additionally, we reviewed previous medical and familial history, history of present illness, and recent lab results along with all available histopathologic and imaging studies. The tumor board considered available treatment options and made the following recommendations: Active surveillance, Concurrent chemo-radiation therapy    The following procedures/referrals were also placed: No orders of the defined types were placed in this encounter.   Clinical Trial Status: not discussed   Staging used: Pathologic Stage  AJCC Staging: T: 1 N: 2 M: 0 Group: Stage 3 Adenocarcinoma of lung   National site-specific guidelines NCCN were discussed with respect to the case.  Tumor board is a meeting of clinicians from various specialty areas who evaluate and discuss patients for whom a multidisciplinary approach is being considered. Final determinations in the plan of care are those of the provider(s). The responsibility for follow up of recommendations given during tumor board is that of the provider.   Today's extended care, comprehensive team conference, Jennifer Hodges was not present for the discussion and was not examined.   Multidisciplinary Tumor Board is a multidisciplinary case peer review process.  Decisions discussed in the Multidisciplinary Tumor Board reflect the opinions of the specialists present at the conference without having examined the patient.  Ultimately, treatment and diagnostic decisions rest with the primary provider(s)  and the patient.

## 2018-12-28 ENCOUNTER — Encounter: Payer: Self-pay | Admitting: Oncology

## 2018-12-28 MED ORDER — LORAZEPAM 0.5 MG PO TABS: 0.5000 mg | ORAL_TABLET | Freq: Four times a day (QID) | ORAL | 0 refills | Status: DC | PRN

## 2018-12-28 MED ORDER — LIDOCAINE-PRILOCAINE 2.5-2.5 % EX CREA: TOPICAL_CREAM | CUTANEOUS | 3 refills | Status: DC

## 2018-12-28 MED ORDER — PROCHLORPERAZINE MALEATE 10 MG PO TABS: 10.0000 mg | ORAL_TABLET | Freq: Four times a day (QID) | ORAL | 1 refills | Status: DC | PRN

## 2018-12-28 MED ORDER — DEXAMETHASONE 4 MG PO TABS: 8.0000 mg | ORAL_TABLET | Freq: Every day | ORAL | 1 refills | Status: DC

## 2018-12-28 MED ORDER — ONDANSETRON HCL 8 MG PO TABS: 8.0000 mg | ORAL_TABLET | Freq: Two times a day (BID) | ORAL | 1 refills | Status: DC | PRN

## 2018-12-28 NOTE — Progress Notes (Signed)
START ON PATHWAY REGIMEN - Non-Small Cell Lung     Administer weekly:     Paclitaxel      Carboplatin   **Always confirm dose/schedule in your pharmacy ordering system**  Patient Characteristics: Stage III - Unresectable, PS = 0, 1 AJCC T Category: T1b Current Disease Status: No Distant Mets or Local Recurrence AJCC N Category: N2 AJCC M Category: M0 AJCC 8 Stage Grouping: IIIA ECOG Performance Status: 1 Intent of Therapy: Curative Intent, Discussed with Patient

## 2018-12-28 NOTE — Progress Notes (Signed)
Hematology/Oncology Consult note [facility] [phone] [phone]  Patient Care Team: [name], MD as PCP - General (Family Medicine) Telford Nab, RN as Registered Nurse   Name of the patient: Jennifer Hodges  [id number]  [birth year]    Reason for referral-new diagnosis of lung cancer   Referring physician-Dr. Laural Roes  Date of visit: 12/28/18   History of presenting illness-patient is a 73 year old female who was seen by pulmonary and ENT for ongoing symptoms of bronchitis and possible laryngitis and received antibiotics for the same. She underwent CT chest in September 2020 which showed multiple bilateral lung nodules with associated mediastinal adenopathy and possible primary left upper lobe lung mass.  This was followed by a PET CT scan which showed a left upper lobe lung mass measuring 1.9 x 1.2 cm with an SUV of 10.7.  Conglomerate AP window adenopathy measuring 5.8 x 2.1 cm with an SUV of 14.  She was also noted to have hypermetabolic left hilar adenopathy and left supraclavicular 0.7 cm lymph node with an SUV of 4.6.  Noted to have a 7 mm right lower lobe lung nodule with faint metabolic activity.  No evidence of other distant metastatic disease.  This was followed by a CT super D chest without contrast.  The right lower lobe lung nodules were somewhat larger as compared to September 2020.  Bronchoscopy of the left upper lobe lung mass and the lymph node showed non-small cell lung carcinoma favoring adenocarcinoma.  She has been referred to [place] for further management.  Patient reports that she was a former smoker but quit smoking in 2009.  She does not require any baseline oxygen at home.  She does have a history of sleep apnea and uses CPAP.  She has had ongoing hoarseness of voice over the last couple of months which has not improved.  Also reports on and off cough  ECOG PS- 1  Pain scale- 0   Review of systems- Review of  Systems  Constitutional: Positive for malaise/fatigue. Negative for chills, fever and weight loss.  HENT: Negative for congestion, ear discharge and nosebleeds.        Hoarseness of voice  Eyes: Negative for blurred vision.  Respiratory: Positive for cough. Negative for hemoptysis, sputum production, shortness of breath and wheezing.   Cardiovascular: Negative for chest pain, palpitations, orthopnea and claudication.  Gastrointestinal: Negative for abdominal pain, blood in stool, constipation, diarrhea, heartburn, melena, nausea and vomiting.  Genitourinary: Negative for dysuria, flank pain, frequency, hematuria and urgency.  Musculoskeletal: Negative for back pain, joint pain and myalgias.  Skin: Negative for rash.  Neurological: Negative for dizziness, tingling, focal weakness, seizures, weakness and headaches.  Endo/Heme/Allergies: Does not bruise/bleed easily.  Psychiatric/Behavioral: Negative for depression and suicidal ideas. The patient does not have insomnia.     Allergies  Allergen Reactions   Aspirin Hives and Other (See Comments)    Difficulty breathing   Celebrex [Celecoxib] Shortness Of Breath   Morphine And Related Nausea And Vomiting and Swelling   Adhesive [Tape] Other (See Comments)    Took top layer of skin off when removed.   Clarithromycin Nausea And Vomiting   Codeine Nausea And Vomiting   Darvon [Propoxyphene Hcl] Nausea And Vomiting   Demerol [Meperidine] Nausea And Vomiting   Flonase [Fluticasone Propionate] Other (See Comments)    Fungal infection   Simvastatin Other (See Comments)    "caused ulcers in mouth, and fever"   Talwin [Pentazocine] Nausea And Vomiting  Patient Active Problem List   Diagnosis Date Noted   Goals of care, counseling/discussion 12/27/2018   Malignant neoplasm of upper lobe of left lung ([place]) 12/27/2018   Knee pain 05/11/2015   Chest tightness 03/24/2015   Stool incontinence 03/24/2015   Urinary frequency  03/04/2015   Cervical spondylosis with radiculopathy 02/24/2015   Pre-syncope 02/19/2015   URI (upper respiratory infection) 02/15/2015   Neck pain 12/25/2014   Pelvic pain in female 11/16/2014   Primary osteoarthritis of hip 08/05/2014   Heel pain 06/01/2014   Diarrhea 06/01/2014   Health care maintenance 06/01/2014   Right leg pain 03/30/2014   Right arm pain 03/30/2014   Sinusitis 03/30/2014   Internal nasal lesion 11/12/2013   Rib pain 08/04/2013   Elevated AFP 04/28/2013   Osteoporosis 11/12/2012   Nonspecific (abnormal) findings on radiological and other examination of gastrointestinal tract 05/31/2012   Bronchiectasis ([place]) 02/05/2012   Sleep apnea 02/05/2012   Degenerative disc disease, cervical 02/05/2012   Hypercholesterolemia 02/05/2012   GERD (gastroesophageal reflux disease) 02/05/2012   Chronic headaches 02/05/2012     Past Medical History:  Diagnosis Date   Anemia    Asthma    No Inhalers--Dr. [name] will order as needed   Bronchiectasis ([place])    mild   Chronic headaches     followed by [facility] migraines   COPD (chronic obstructive pulmonary disease) ([place])    DDD (degenerative disc disease), lumbar    Diabetes mellitus without complication ([place])    "doctor says I no longer have diabetes"     Diverticulosis    Family history of adverse reaction to anesthesia    PONV   Gall stones    history of   GERD (gastroesophageal reflux disease)    EGD 8/09- non bleeding erosive gastritis, documentd esophageal ulcerations.    Hiatal hernia    History of pneumonia    Hypercholesterolemia    IBS (irritable bowel syndrome)    Malignant neoplasm of upper lobe of left lung ([place]) 12/27/2018   Murmur    Osteoarthritis    lumbar disc disease, left hip   PONV (postoperative nausea and vomiting)    "Only with last hip and I believe it was due to the morphine"    Sleep apnea    cpap asked to bring mask and tubing    Weakness of right side of body      Past Surgical History:  Procedure Laterality Date   ABDOMINAL HYSTERECTOMY  age 52   ANTERIOR CERVICAL DECOMP/DISCECTOMY FUSION N/A 02/24/2015   Procedure: CERVICAL FOUR-FIVE, CERVICAL FIVE-SIX, CERVICAL SIX-SEVEN ANTERIOR CERVICAL DECOMPRESSION/DISCECTOMY FUSION ;  Surgeon: [name], MD;  Location: [place] [facility];  Service: Neurosurgery;  Laterality: N/A;  C45 C56 C67 anterior cervical decompression with fusion interbody prosthesis plating and bonegraft   APPENDECTOMY     BACK SURGERY     4th lumbar fusion   BLADDER SURGERY N/A    with vaginal wall repair   BREAST CYST ASPIRATION Bilateral    neg   BREAST SURGERY Bilateral    cyst removed and reduction   CARDIAC CATHETERIZATION  2014   CARPAL TUNNEL RELEASE Right 02/11/2016   Procedure: CARPAL TUNNEL RELEASE;  Surgeon: [name], MD;  Location: [facility];  Service: Orthopedics;  Laterality: Right;   CATARACT EXTRACTION W/ INTRAOCULAR LENS IMPLANT Bilateral 2015   CHOLECYSTECTOMY     EUS N/A 05/31/2012   Procedure: UPPER ENDOSCOPIC ULTRASOUND (EUS) LINEAR;  Surgeon: [name], MD;  Location:  [facility];  Service: Endoscopy;  Laterality: N/A;   EXCISIONAL HEMORRHOIDECTOMY     JOINT REPLACEMENT Bilateral    KNEE ARTHROSCOPY WITH LATERAL MENISECTOMY Right 07/07/2015   Procedure: KNEE ARTHROSCOPY WITH LATERAL MENISECTOMY, PARTIAL SYNOVECTOMY;  Surgeon: [name], MD;  Location: [facility];  Service: Orthopedics;  Laterality: Right;   LUMBAR LAMINECTOMY     REDUCTION MAMMAPLASTY  1990   RIGHT OOPHORECTOMY     TOTAL HIP ARTHROPLASTY Left 05/01/2014   Dr. [name]   TOTAL HIP ARTHROPLASTY Right 08/05/2014   Procedure: TOTAL HIP ARTHROPLASTY ANTERIOR APPROACH;  Surgeon: [name], MD;  Location: [facility];  Service: Orthopedics;  Laterality: Right;   ULNAR NERVE TRANSPOSITION Right 02/11/2016   Procedure: ULNAR NERVE DECOMPRESSION/TRANSPOSITION;  Surgeon:  [name], MD;  Location: [facility];  Service: Orthopedics;  Laterality: Right;   VIDEO BRONCHOSCOPY WITH ENDOBRONCHIAL ULTRASOUND Left 12/19/2018   Procedure: VIDEO BRONCHOSCOPY WITH ENDOBRONCHIAL ULTRASOUND, LEFT, SLEEP APNEA;  Surgeon: [name], MD;  Location: [facility];  Service: Thoracic;  Laterality: Left;    Social History   Socioeconomic History   Marital status: Married    Spouse name: Not on file   Number of children: Not on file   Years of education: Not on file   Highest education level: Not on file  Occupational History   Not on file  Social Needs   Financial resource strain: Not on file   Food insecurity    Worry: Not on file    Inability: Not on file   Transportation needs    Medical: Not on file    Non-medical: Not on file  Tobacco Use   Smoking status: Former Smoker    Quit date: 06/13/2007    Years since quitting: 11.5   Smokeless tobacco: Never Used  Substance and Sexual Activity   Alcohol use: No    Alcohol/week: 0.0 standard drinks   Drug use: No   Sexual activity: Never  Lifestyle   Physical activity    Days per week: Not on file    Minutes per session: Not on file   Stress: Not on file  Relationships   Social connections    Talks on phone: Not on file    Gets together: Not on file    Attends religious service: Not on file    Active member of club or organization: Not on file    Attends meetings of clubs or organizations: Not on file    Relationship status: Not on file   Intimate partner violence    Fear of current or ex partner: Not on file    Emotionally abused: Not on file    Physically abused: Not on file    Forced sexual activity: Not on file  Other Topics Concern   Not on file  Social History Narrative   Not on file     Family History  Problem Relation Age of Onset   Heart disease Mother        s/p stent   Hypertension Mother    Hypercholesterolemia Mother    Diabetes Father    Stomach cancer  Other        uncle   Breast cancer Cousin    Breast cancer Paternal Aunt      Current Outpatient Medications:    acetaminophen (TYLENOL) 500 MG tablet, Take 500 mg by mouth every 6 (six) hours as needed for mild pain or moderate pain. Reported on 04/06/2015, Disp: , Rfl:    albuterol (PROVENTIL) (2.5  MG/3ML) 0.083% nebulizer solution, Take 2.5 mg by nebulization every 6 (six) hours as needed for wheezing., Disp: , Rfl:    albuterol (VENTOLIN HFA) 108 (90 Base) MCG/ACT inhaler, Inhale 2 puffs into the lungs every 6 (six) hours as needed for wheezing or shortness of breath., Disp: 18 g, Rfl: 0   benzonatate (TESSALON) 100 MG capsule, Take 1 capsule (100 mg total) by mouth 3 (three) times daily as needed for cough., Disp: 21 capsule, Rfl: 0   Biotin 10000 MCG TABS, Take 10,000 mcg by mouth daily., Disp: , Rfl:    Calcium-Magnesium-Zinc (CAL-MAG-ZINC PO), Take 1 tablet by mouth daily., Disp: , Rfl:    cetirizine (ZYRTEC) 5 MG tablet, Take 1 tablet (5 mg total) by mouth daily. (Patient taking differently: Take 5 mg by mouth daily as needed (runny nose.). ), Disp: 30 tablet, Rfl: 0   CHLORASEPTIC MAX SORE THROAT 1.5-33 % LIQD, Use as directed 1 spray in the mouth or throat as needed (throat irritation.)., Disp: , Rfl:    Cholecalciferol (VITAMIN D3) 25 MCG (1000 UT) CHEW, Chew 5,000 Units by mouth daily., Disp: , Rfl:    Coenzyme Q10 (CO Q 10) 100 MG CAPS, Take 100 mg by mouth daily. , Disp: , Rfl:    Cyanocobalamin (VITAMIN B-12) 2500 MCG SUBL, Place 2,500 mcg under the tongue daily., Disp: , Rfl:    cyclobenzaprine (FLEXERIL) 10 MG tablet, Take 10 mg by mouth at bedtime. , Disp: , Rfl:    DHEA 25 MG CAPS, Take 25 mg by mouth daily., Disp: , Rfl:    ezetimibe (ZETIA) 10 MG tablet, Take 10 mg by mouth at bedtime., Disp: , Rfl:    folic acid (FOLVITE) 401 MCG tablet, Take 400 mcg by mouth 2 (two) times daily. , Disp: , Rfl:    Garlic 0272 MG CAPS, Take 1,000 mg by mouth daily., Disp:  , Rfl:    Histamine Dihydrochloride (AUSTRALIAN DREAM ARTHRITIS EX), Apply 1 application topically 4 (four) times daily as needed (pain.)., Disp: , Rfl:    HYDROcodone-acetaminophen (NORCO/VICODIN) 5-325 MG tablet, 1-2 tabs po bid prn (Patient taking differently: Take 1 tablet by mouth at bedtime. ), Disp: 10 tablet, Rfl: 0   ipratropium (ATROVENT) 0.06 % nasal spray, Place 2 sprays into both nostrils 4 (four) times daily as needed for rhinitis., Disp: 15 mL, Rfl: 0   montelukast (SINGULAIR) 10 MG tablet, Take 10 mg by mouth at bedtime. , Disp: , Rfl:    ondansetron (ZOFRAN-ODT) 4 MG disintegrating tablet, Take 4 mg by mouth every 8 (eight) hours as needed for nausea/vomiting., Disp: , Rfl:    pantoprazole (PROTONIX) 40 MG tablet, Take 1 tablet (40 mg total) by mouth 2 (two) times daily before a meal., Disp: 60 tablet, Rfl: 1   Polyethyl Glycol-Propyl Glycol (SYSTANE) 0.4-0.3 % SOLN, Place 1 drop into both eyes 5 (five) times daily as needed (dry/irritated eyes). , Disp: , Rfl:    pyridOXINE (VITAMIN B-6) 100 MG tablet, Take 100 mg by mouth daily., Disp: , Rfl:    umeclidinium-vilanterol (ANORO ELLIPTA) 62.5-25 MCG/INH AEPB, Inhale into the lungs., Disp: , Rfl:    vitamin E 400 UNIT capsule, Take 400 Units by mouth daily., Disp: , Rfl:    Zoledronic Acid (RECLAST IV), Inject 1 Dose into the vein as directed. ONCE A YEAR, Disp: , Rfl:    Bioflavonoid Products (ESTER C PO), Take 1 tablet by mouth 3 (three) times daily., Disp: , Rfl:    Physical exam:  Vitals:   12/27/18 1120  BP: (!) 149/72  Pulse: 93  Resp: 20  Temp: 97.6 F (36.4 C)  TempSrc: Tympanic  Weight: 171 lb 8 oz (77.8 kg)  Height: 5\' 2"  (1.575 m)   Physical Exam Constitutional:      Comments: Voice appears high-pitched and hoarse.  Patient appears in no acute distress  HENT:     Head: Normocephalic and atraumatic.  Eyes:     Pupils: Pupils are equal, round, and reactive to light.  Neck:     Musculoskeletal:  Normal range of motion.  Cardiovascular:     Rate and Rhythm: Normal rate and regular rhythm.     Heart sounds: Normal heart sounds.  Pulmonary:     Effort: Pulmonary effort is normal.     Breath sounds: Normal breath sounds.  Abdominal:     General: Bowel sounds are normal.     Palpations: Abdomen is soft.  Skin:    General: Skin is warm and dry.  Neurological:     Mental Status: She is alert and oriented to person, place, and time.        CMP Latest Ref Rng & Units 12/27/2018  Glucose 70 - 99 mg/dL 105(H)  BUN 8 - 23 mg/dL 8  Creatinine 0.44 - 1.00 mg/dL 0.45  Sodium 135 - 145 mmol/L 139  Potassium 3.5 - 5.1 mmol/L 4.4  Chloride 98 - 111 mmol/L 104  CO2 22 - 32 mmol/L 27  Calcium 8.9 - 10.3 mg/dL 9.4  Total Protein 6.5 - 8.1 g/dL 7.0  Total Bilirubin 0.3 - 1.2 mg/dL 0.4  Alkaline Phos 38 - 126 U/L 79  AST 15 - 41 U/L 15  ALT 0 - 44 U/L 17   CBC Latest Ref Rng & Units 12/27/2018  WBC 4.0 - 10.5 K/uL 9.4  Hemoglobin 12.0 - 15.0 g/dL 13.4  Hematocrit 36.0 - 46.0 % 40.9  Platelets 150 - 400 K/uL 245    No images are attached to the encounter.  Nm Pet Image Initial (pi) Skull Base To Thigh  Result Date: 12/03/2018 CLINICAL DATA:  Initial treatment strategy for pulmonary nodule. EXAM: NUCLEAR MEDICINE PET SKULL BASE TO THIGH TECHNIQUE: 9.6 mCi F-18 FDG was injected intravenously. Full-ring PET imaging was performed from the skull base to thigh after the radiotracer. CT data was obtained and used for attenuation correction and anatomic localization. Fasting blood glucose: 100 mg/dl COMPARISON:  Chest CT from 11/20/2018 FINDINGS: Mediastinal blood pool activity: SUV max 2.6 Liver activity: SUV max 3.8 NECK: Physiologic muscular activity along the right deltoid muscle left lower neck strap musculature. Incidental CT findings: Bilateral common carotid atherosclerotic calcification. CHEST: A left upper lobe nodule abutting the mediastinum measures 1.9 by 1.2 cm on image 84/3 with  maximum SUV 10.7. Conglomerate AP window adenopathy measuring 5.8 by 2.1 cm on image 80/3, maximum SUV 14.2. Left hilar adenopathy noted with a posterior left hilar lymph node measuring SUV max 12.8, and a smaller anterior hilar node with maximum SUV of 7.6. Left supraclavicular node 0.7 cm in short axis on image 58/3, maximum SUV 4.6. Faintly hypermetabolic right lower paratracheal right pericardial lymph nodes. Subtle A 7 mm pulmonary nodule in the posterior basal segment right lower lobe on image 104/3 is faintly accentuated metabolic activity, maximum SUV 2.7. Other nodules in this vicinity likely also show minimal accentuated metabolic activity. Some of the nodules in the lungs are not clearly hypermetabolic but are below sensitive PET-CT size thresholds. Incidental CT findings: Coronary,  aortic arch, and branch vessel atherosclerotic vascular disease. Centrilobular emphysema. ABDOMEN/PELVIS: Minimal accentuation of activity lower rectum, probably physiologic. Mild focal accentuation of activity the hepatic flexure without associated CT abnormality, maximum SUV 5.1. Mild accentuation of activity in the descending colon focally, maximum SUV 5.0. Incidental CT findings: Aortoiliac atherosclerotic vascular disease. SKELETON: No significant abnormal hypermetabolic activity in this region. Incidental CT findings: Bilateral total hip prostheses. Lower cervical plate and screw fixator. IMPRESSION: 1. Hypermetabolic dominant left upper lobe nodule with hypermetabolic left hilar and hypermetabolic conglomerate AP window adenopathy compatible with active malignancy. 2. Low-grade hypermetabolic activity in a left supraclavicular lymph node and in right lower paratracheal and right pericardial lymph nodes, suspicious for but not absolutely diagnostic of metastatic activity. 3. One of the larger right lower lobe pulmonary nodules has some faintly accentuated metabolic activity, maximum SUV of 2.7. Some of the lung nodules  are technically too small to characterize and not measurably hypermetabolic. Mildly accentuated activity focally in the hepatic flexure and distal descending colon, probably physiologic. Correlate with colon screening history in determining whether colonoscopy is warranted. 4. Other imaging findings of potential clinical significance: Aortic Atherosclerosis (ICD10-I70.0) and Emphysema (ICD10-J43.9). Coronary atherosclerosis. Electronically Signed   By: Van Clines M.D.   On: 12/03/2018 10:49   Dg C-arm 1-60 Min-no Report  Result Date: 12/19/2018 Fluoroscopy was utilized by the requesting physician.  No radiographic interpretation.   Ct Super D Chest Wo Contrast  Result Date: 12/19/2018 CLINICAL DATA:  73 year old female with history of cough and shortness of breath for the past 2-3 months. EXAM: CT CHEST WITHOUT CONTRAST TECHNIQUE: Multidetector CT imaging of the chest was performed using thin slice collimation for electromagnetic bronchoscopy planning purposes, without intravenous contrast. COMPARISON:  Chest CT 11/20/2018.  PET-CT 12/03/2018. FINDINGS: Cardiovascular: Heart size is normal. There is no significant pericardial fluid, thickening or pericardial calcification. There is aortic atherosclerosis, as well as atherosclerosis of the great vessels of the mediastinum and the coronary arteries, including calcified atherosclerotic plaque in the left anterior descending, left circumflex and right coronary arteries. Mediastinum/Nodes: Worsening mediastinal lymphadenopathy. Specific examples include a 1.9 cm short axis AP window lymph node (axial image 20 of series 2), previously 1.8 cm. Bulky prevascular lymph nodes measuring 2.0 x 5.9 cm (axial image 18 of series 2), previously 1.9 x 5.0 cm. Borderline enlarged low right paratracheal lymph node measuring 1.2 cm in short axis (previously only 7 mm). Esophagus is unremarkable in appearance. No axillary lymphadenopathy Lungs/Pleura: Multiple pulmonary  nodules are noted in the lungs bilaterally, most of which appear increased in size compared to the prior examination. The largest of these is in the medial aspect of the left upper lobe (axial image 20 of series 2 and coronal image 32 of series 6) measuring 2.1 x 1.7 x 2.3 cm, previously 2.0 x 1.7 x 1.8 cm. Clustered pulmonary nodules in the right lower lobe generally appear increased in size compared to the prior examination, best demonstrated by 9 mm right lower lobe nodule (axial image 40 of series 3), which previously measured 8 mm, and 8 mm right lower lobe nodule (axial image 45 of series 3), previously 6 mm). No acute consolidative airspace disease. No pleural effusions. Upper Abdomen: Aortic atherosclerosis. Musculoskeletal: Orthopedic fixation hardware in the lower cervical spine incidentally noted. There are no aggressive appearing lytic or blastic lesions noted in the visualized portions of the skeleton. IMPRESSION: 1. Progression of disease as evidenced by enlargement of previously noted pulmonary nodules in the lungs bilaterally, as  well as progressive mediastinal lymphadenopathy, as detailed above. 2. Aortic atherosclerosis, in addition to two vessel coronary artery disease. Assessment for potential risk factor modification, dietary therapy or pharmacologic therapy may be warranted, if clinically indicated. Aortic Atherosclerosis (ICD10-I70.0). Electronically Signed   By: Vinnie Langton M.D.   On: 12/19/2018 10:18    Assessment and plan- Patient is a 73 y.o. female with newly diagnosed adenocarcinoma of the lung stage III T1b N2 M0  I have reviewed PET/CT scan images independently as well as CT scans and discussed findings with the patient.  We reviewed her images at the tumor board as well.  Patient has a left upper lobe primary lung lesion which is about 1.9 cm in size along with hilar and mediastinal adenopathy.  She does have subcentimeter right lower lobe lung lesions which do not have  significant PET uptake likely due to their small size which are suspicious.  However I will continue to watch these nodules closely and treat her with concurrent chemoradiation and stage III lung cancer.  Dr. Donella Stade from radiation oncology was also present at the tumor board and agrees with the treatment plan.  She will be seen him soon to discuss radiation treatment.  I recommend weekly chemotherapy with carbotaxol AUC 2 and Taxol at 45 mg per metered square weekly IV with radiation treatment.  Discussed risks and benefits of chemotherapy including all but not limited to nausea, vomiting, low blood counts, risk of infections and hospitalization.  Risk of infusion reaction peripheral neuropathy associated with Taxol.  Treatment will be given with the potential curative intent.  Plan to repeat CT scans after concurrent chemoradiation is done and if she has partial or stable disease we will move onto maintenance durvalumab at that time.  Hoarseness of voice: Etiology is unclear if patient has laryngeal nerve involvement.  I will touch base with Dr. Richardson Landry and obtain his notes as well.  Continue to monitor  Patient will need to attend chemo class and will need port placement for chemotherapy.  I will tentatively see her back in 2 weeks time to start chemotherapy.  She will need MRI brain to complete her staging work-up.  I will obtain baseline labs today  Cancer Staging Malignant neoplasm of upper lobe of left lung [facility]) Staging form: Lung, AJCC 8th Edition - Clinical stage from 12/27/2018: Stage IIIA (cT1b, cN2, cM0) - Signed by Sindy Guadeloupe, MD on 12/27/2018     Total face to face encounter time for this patient visit was 60 min. >50% of the time was  spent in counseling and coordination of care.     Thank you for this kind referral and the opportunity to participate in the care of this patient   Visit Diagnosis 1. Malignant neoplasm of upper lobe of left lung ([place])   2. Goals of care,  counseling/discussion     Dr. Randa Evens, MD, MPH [facility] [phone] 12/28/2018  4:01 PM

## 2018-12-31 ENCOUNTER — Encounter: Payer: Self-pay | Admitting: Radiation Oncology

## 2018-12-31 ENCOUNTER — Telehealth (INDEPENDENT_AMBULATORY_CARE_PROVIDER_SITE_OTHER): Payer: Self-pay

## 2018-12-31 NOTE — Telephone Encounter (Signed)
Spoke with the patient and she is now scheduled with Dr. Lucky Cowboy for port placement on 01/07/2019 with a 9:30 am arrival time to the [facility]. Patient will do her Covid testing on 01/04/2019 between 12:30-2:30 pm at the [facility]. Pre-procedure instructions were discussed and will be mailed to the patient.

## 2019-01-01 ENCOUNTER — Other Ambulatory Visit: Payer: Self-pay | Admitting: *Deleted

## 2019-01-01 ENCOUNTER — Encounter: Payer: Self-pay | Admitting: Radiation Oncology

## 2019-01-01 ENCOUNTER — Encounter: Payer: Self-pay | Admitting: *Deleted

## 2019-01-01 ENCOUNTER — Ambulatory Visit
Admission: RE | Admit: 2019-01-01 | Discharge: 2019-01-01 | Disposition: A | Discharge status: Home / Self Care | Payer: Medicare Other | Source: Ambulatory Visit | Attending: Radiation Oncology | Admitting: Radiation Oncology

## 2019-01-01 ENCOUNTER — Other Ambulatory Visit: Payer: Self-pay

## 2019-01-01 VITALS — BP 140/74 | HR 90 | Temp 98.0°F | Resp 18 | Wt 159.3 lb

## 2019-01-01 DIAGNOSIS — E119 Type 2 diabetes mellitus without complications: Secondary | ICD-10-CM | POA: Diagnosis not present

## 2019-01-01 DIAGNOSIS — Z79899 Other long term (current) drug therapy: Secondary | ICD-10-CM | POA: Diagnosis not present

## 2019-01-01 DIAGNOSIS — K219 Gastro-esophageal reflux disease without esophagitis: Secondary | ICD-10-CM | POA: Diagnosis not present

## 2019-01-01 DIAGNOSIS — C3412 Malignant neoplasm of upper lobe, left bronchus or lung: Secondary | ICD-10-CM | POA: Diagnosis present

## 2019-01-01 DIAGNOSIS — Z87891 Personal history of nicotine dependence: Secondary | ICD-10-CM | POA: Diagnosis not present

## 2019-01-01 DIAGNOSIS — M5136 Other intervertebral disc degeneration, lumbar region: Secondary | ICD-10-CM | POA: Insufficient documentation

## 2019-01-01 DIAGNOSIS — R011 Cardiac murmur, unspecified: Secondary | ICD-10-CM | POA: Diagnosis not present

## 2019-01-01 DIAGNOSIS — D649 Anemia, unspecified: Secondary | ICD-10-CM | POA: Diagnosis not present

## 2019-01-01 DIAGNOSIS — J45909 Unspecified asthma, uncomplicated: Secondary | ICD-10-CM | POA: Diagnosis not present

## 2019-01-01 DIAGNOSIS — J449 Chronic obstructive pulmonary disease, unspecified: Secondary | ICD-10-CM | POA: Insufficient documentation

## 2019-01-01 DIAGNOSIS — G4730 Sleep apnea, unspecified: Secondary | ICD-10-CM | POA: Diagnosis not present

## 2019-01-01 DIAGNOSIS — E7800 Pure hypercholesterolemia, unspecified: Secondary | ICD-10-CM | POA: Diagnosis not present

## 2019-01-01 DIAGNOSIS — K449 Diaphragmatic hernia without obstruction or gangrene: Secondary | ICD-10-CM | POA: Insufficient documentation

## 2019-01-01 DIAGNOSIS — M1990 Unspecified osteoarthritis, unspecified site: Secondary | ICD-10-CM | POA: Diagnosis not present

## 2019-01-01 MED ORDER — HYDROCODONE-HOMATROPINE 5-1.5 MG/5ML PO SYRP: 5.0000 mL | ORAL_SOLUTION | Freq: Three times a day (TID) | ORAL | 0 refills | Status: DC | PRN

## 2019-01-01 NOTE — Progress Notes (Signed)
  Oncology Nurse Navigator Documentation  Navigator Location: [facility] (01/01/19 1100)   )Navigator Encounter Type: Initial RadOnc (01/01/19 1100)                         Barriers/Navigation Needs: Family Concerns (01/01/19 1100)   Interventions: Coordination of Care (01/01/19 1100)   Coordination of Care: Pathology (01/01/19 1100)         met with patient during initial rad-onc consultation. All questions answered during visit. Reviewed upcoming appts with patient. Also, met pt's daughter in parking lot to provide clarity to treatment plan and answer any remaining questions. Pt's daughter stated that pt stays up at night with cough and was wondering if anything could be called in to help relieve her cough during the night. Per Dr. Janese Banks, pt can try hycodan. Pt has allergy to codeine and confirmed with pt that can take hycodan. Pt states that she only gets nauseated with vomiting when taking higher dosages of codeine. She states that she will be able to tolerate the smaller dosage and being to go to sleep after taking. Pt informed that prescription will be sent into pharmacy. Discussed molecular testing with pt and her daughter. Informed that testing is usually ordered for stage 4 lung cancers and not typically with stage 3 since results will most likely not affect her current treatment plan. Pt's daughter requested that testing be sent to potentially have more options in the future if her cancer progresses. Per Dr. Janese Banks, okay to send limited testing at this time. Encouraged pt's daughter to attend chemo education on Thursday with patient to further clarify pt's chemotherapy regimen. Pt and her daughter verbalized understanding. Nothing further needed at this time.         Time Spent with Patient: 60 (01/01/19 1100)

## 2019-01-01 NOTE — Consult Note (Signed)
NEW PATIENT EVALUATION  Name: Jennifer Hodges  MRN: [id number]  Date:   01/01/2019     DOB: 1946/02/28   This 73 y.o. female patient presents to the clinic for initial evaluation of stage IIIa (.  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 Lung Cancer    Initial Eval    DIAGNOSIS: The encounter diagnosis was Malignant neoplasm of upper lobe of left lung ([place]).   PREVIOUS INVESTIGATIONS:  Pathology report reviewed Imaging reviewed Clinical notes reviewed Case presented at tumor board  HPI: Patient is a 73 year old female heavy smoking history who presented to pulmonology with increasing pulmonary symptoms including increased cough symptoms of bronchitis and possible laryngitis.  She was treated empirically with antibiotic therapy without benefit underwent CT scan of her chest in September 2020 showing bilateral pulmonary nodules and mediastinal adenopathy.  PET/CT confirmed dominant left upper lobe nodule with hypermetabolic activity left hilar hypermetabolic activity and adenopathy in the AP window compatible with malignancy.  She had low-grade hypermetabolic [place] in left supraclavicular lymph node and right lower paratracheal and right pericardial lymph node suspicious but not diagnostic of metastatic disease.  Patient went bronchoscopy left upper lobe lung mass and lymph node showing non-small cell lung cancer favoring adenocarcinoma.  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  She has been seen by medical oncology and was presented at tumor board with recommendation for concurrent chemoradiation therapy.  She still complains of some air hunger also some dysphagia  secondary to neck surgery she had years ago.  PLANNED TREATMENT REGIMEN: Concurrent chemoradiation treating patient has a stage III  PAST MEDICAL HISTORY:  has a past medical history of Anemia, Asthma, Bronchiectasis ([place]), Chronic headaches, COPD (chronic obstructive pulmonary disease) ([place]), DDD (degenerative disc disease), lumbar, Diabetes mellitus without complication ([place]), Diverticulosis, Family history of adverse reaction to anesthesia, Gall stones, GERD (gastroesophageal reflux disease), Hiatal hernia, History of pneumonia, Hypercholesterolemia, IBS (irritable bowel syndrome), Malignant neoplasm of upper lobe of left lung ([place]) (12/27/2018), Murmur, Osteoarthritis, PONV (postoperative nausea and vomiting), Sleep apnea, and Weakness of right side of body.    PAST SURGICAL HISTORY:  Past Surgical History:  Procedure Laterality Date  . ABDOMINAL HYSTERECTOMY  age 31  . ANTERIOR CERVICAL DECOMP/DISCECTOMY FUSION N/A 02/24/2015   Procedure: CERVICAL FOUR-FIVE, CERVICAL FIVE-SIX, CERVICAL SIX-SEVEN ANTERIOR CERVICAL DECOMPRESSION/DISCECTOMY FUSION ;  Surgeon: [name], MD;  Location: [facility];  Service: Neurosurgery;  Laterality: N/A;  C45 C56 C67 anterior cervical decompression with fusion interbody prosthesis plating and bonegraft  . APPENDECTOMY    . BACK SURGERY     4th lumbar fusion  . BLADDER SURGERY N/A    with vaginal wall repair  . BREAST CYST ASPIRATION Bilateral    neg  . BREAST SURGERY Bilateral    cyst removed and reduction  . CARDIAC CATHETERIZATION  2014  . CARPAL TUNNEL RELEASE Right 02/11/2016   Procedure: CARPAL TUNNEL RELEASE;  Surgeon: [name], MD;  Location: [facility];  Service: Orthopedics;  Laterality: Right;  . CATARACT EXTRACTION W/ INTRAOCULAR LENS IMPLANT Bilateral 2015  . CHOLECYSTECTOMY    . EUS N/A 05/31/2012   Procedure: UPPER ENDOSCOPIC ULTRASOUND (EUS) LINEAR;  Surgeon: [name], MD;  Location: [facility];  Service: Endoscopy;   Laterality: N/A;  . EXCISIONAL HEMORRHOIDECTOMY    . JOINT REPLACEMENT Bilateral   . KNEE ARTHROSCOPY WITH LATERAL MENISECTOMY Right 07/07/2015   Procedure: KNEE ARTHROSCOPY WITH LATERAL MENISECTOMY, PARTIAL SYNOVECTOMY;  Surgeon: [name], MD;  Location: [facility];  Service: Orthopedics;  Laterality: Right;  . LUMBAR LAMINECTOMY    . REDUCTION MAMMAPLASTY  1990  . RIGHT OOPHORECTOMY    . TOTAL HIP ARTHROPLASTY Left 05/01/2014   Dr. [name]  . TOTAL HIP ARTHROPLASTY Right 08/05/2014   Procedure: TOTAL HIP ARTHROPLASTY ANTERIOR APPROACH;  Surgeon: [name], MD;  Location: [facility];  Service: Orthopedics;  Laterality: Right;  . ULNAR NERVE TRANSPOSITION Right 02/11/2016   Procedure: ULNAR NERVE DECOMPRESSION/TRANSPOSITION;  Surgeon: [name], MD;  Location: [facility];  Service: Orthopedics;  Laterality: Right;  Marland Kitchen VIDEO BRONCHOSCOPY WITH ENDOBRONCHIAL ULTRASOUND Left 12/19/2018   Procedure: VIDEO BRONCHOSCOPY WITH ENDOBRONCHIAL ULTRASOUND, LEFT, SLEEP APNEA;  Surgeon: [name], MD;  Location: [facility];  Service: Thoracic;  Laterality: Left;    FAMILY HISTORY: family history includes Breast cancer in her cousin and paternal aunt; Diabetes in her father; Heart disease in her mother; Hypercholesterolemia in her mother; Hypertension in her mother; Stomach cancer in an other family member.  SOCIAL HISTORY:  reports that she quit smoking about 11 years ago. She has never used smokeless tobacco. She reports that she does not drink alcohol or use drugs.  ALLERGIES: Aspirin, Celebrex [celecoxib], Morphine and related, Adhesive [tape], Clarithromycin, Codeine, Darvon [propoxyphene hcl], Demerol [meperidine], Flonase [fluticasone propionate], Simvastatin, and Talwin [pentazocine]  MEDICATIONS:  Current Outpatient Medications  Medication Sig Dispense Refill  . acetaminophen (TYLENOL) 500 MG tablet Take 500 mg by mouth every 6 (six) hours as needed for mild pain or moderate pain.  Reported on 04/06/2015    . albuterol (PROVENTIL) (2.5 MG/3ML) 0.083% nebulizer solution Take 2.5 mg by nebulization every 6 (six) hours as needed for wheezing.    Marland Kitchen albuterol (VENTOLIN HFA) 108 (90 Base) MCG/ACT inhaler Inhale 2 puffs into the lungs every 6 (six) hours as needed for wheezing or shortness of breath. 18 g 0  . benzonatate (TESSALON) 100 MG capsule Take 1 capsule (100 mg total) by mouth 3 (three) times daily as needed for cough. 21 capsule 0  . Bioflavonoid Products (ESTER C PO) Take 1 tablet by mouth 3 (three) times daily.    . Biotin 10000 MCG TABS Take 10,000 mcg by mouth daily.    . Calcium-Magnesium-Zinc (CAL-MAG-ZINC PO) Take 1 tablet by mouth daily.    . cetirizine (ZYRTEC) 5 MG tablet Take 1 tablet (5 mg total) by mouth daily. (Patient taking differently: Take 5 mg by mouth daily as needed (runny nose.). ) 30 tablet 0  . CHLORASEPTIC MAX SORE THROAT 1.5-33 % LIQD Use as directed 1 spray in the mouth or throat as needed (throat irritation.).    Marland Kitchen Cholecalciferol (VITAMIN D3) 25 MCG (1000 UT) CHEW Chew 5,000 Units by mouth daily.    . Coenzyme Q10 (CO Q 10) 100 MG CAPS Take 100 mg by mouth daily.     . Cyanocobalamin (VITAMIN B-12) 2500 MCG SUBL Place 2,500 mcg under the tongue daily.    . cyclobenzaprine (FLEXERIL) 10 MG tablet Take 10 mg by mouth at bedtime.     Marland Kitchen dexamethasone (DECADRON) 4 MG tablet Take 2 tablets (8 mg total) by mouth daily. Start the day after chemotherapy for 2 days. 30 tablet 1  . DHEA 25  MG CAPS Take 25 mg by mouth daily.    Marland Kitchen ezetimibe (ZETIA) 10 MG tablet Take 10 mg by mouth at bedtime.    . folic acid (FOLVITE) 347 MCG tablet Take 400 mcg by mouth 2 (two) times daily.     . Garlic 4259 MG CAPS Take 1,000 mg by mouth daily.    Marland Kitchen Histamine Dihydrochloride (AUSTRALIAN DREAM ARTHRITIS EX) Apply 1 application topically 4 (four) times daily as needed (pain.).    Marland Kitchen HYDROcodone-acetaminophen (NORCO/VICODIN) 5-325 MG tablet 1-2 tabs po bid prn (Patient taking  differently: Take 1 tablet by mouth at bedtime. ) 10 tablet 0  . ipratropium (ATROVENT) 0.06 % nasal spray Place 2 sprays into both nostrils 4 (four) times daily as needed for rhinitis. 15 mL 0  . lidocaine-prilocaine (EMLA) cream Apply to affected area once 30 g 3  . LORazepam (ATIVAN) 0.5 MG tablet Take 1 tablet (0.5 mg total) by mouth every 6 (six) hours as needed (Nausea or vomiting). 30 tablet 0  . montelukast (SINGULAIR) 10 MG tablet Take 10 mg by mouth at bedtime.     . ondansetron (ZOFRAN) 8 MG tablet Take 1 tablet (8 mg total) by mouth 2 (two) times daily as needed for refractory nausea / vomiting. Start on day 3 after chemo. 30 tablet 1  . ondansetron (ZOFRAN-ODT) 4 MG disintegrating tablet Take 4 mg by mouth every 8 (eight) hours as needed for nausea/vomiting.    . pantoprazole (PROTONIX) 40 MG tablet Take 1 tablet (40 mg total) by mouth 2 (two) times daily before a meal. 60 tablet 1  . Polyethyl Glycol-Propyl Glycol (SYSTANE) 0.4-0.3 % SOLN Place 1 drop into both eyes 5 (five) times daily as needed (dry/irritated eyes).     . prochlorperazine (COMPAZINE) 10 MG tablet Take 1 tablet (10 mg total) by mouth every 6 (six) hours as needed (Nausea or vomiting). 30 tablet 1  . pyridOXINE (VITAMIN B-6) 100 MG tablet Take 100 mg by mouth daily.    Marland Kitchen umeclidinium-vilanterol (ANORO ELLIPTA) 62.5-25 MCG/INH AEPB Inhale into the lungs.    . vitamin E 400 UNIT capsule Take 400 Units by mouth daily.    . Zoledronic Acid (RECLAST IV) Inject 1 Dose into the vein as directed. ONCE A YEAR     No current facility-administered medications for this encounter.     ECOG PERFORMANCE STATUS:  1 - Symptomatic but completely ambulatory  REVIEW OF SYSTEMS: Except for the minor dysphagia hoarseness Patient denies any weight loss, fatigue, weakness, fever, chills or night sweats. Patient denies any loss of vision, blurred vision. Patient denies any ringing  of the ears or hearing loss. No irregular heartbeat.  Patient denies heart murmur or history of fainting. Patient denies any chest pain or pain radiating to her upper extremities. Patient denies any shortness of breath, difficulty breathing at night, cough or hemoptysis. Patient denies any swelling in the lower legs. Patient denies any nausea vomiting, vomiting of blood, or coffee ground material in the vomitus. Patient denies any stomach pain. Patient states has had normal bowel movements no significant constipation or diarrhea. Patient denies any dysuria, hematuria or significant nocturia. Patient denies any problems walking, swelling in the joints or loss of balance. Patient denies any skin changes, loss of hair or loss of weight. Patient denies any excessive worrying or anxiety or significant depression. Patient denies any problems with insomnia. Patient denies excessive thirst, polyuria, polydipsia. Patient denies any swollen glands, patient denies easy bruising or easy bleeding. Patient denies  any recent infections, allergies or URI. Patient "s visual fields have not changed significantly in recent time.   PHYSICAL EXAM: BP 140/74   Pulse 90   Temp 98 F (36.7 C)   Resp 18   Wt 159 lb 4.8 oz (72.3 kg)   BMI 29.14 kg/m  Well-developed well-nourished patient in NAD. HEENT reveals PERLA, EOMI, discs not visualized.  Oral cavity is clear. No oral mucosal lesions are identified. Neck is clear without evidence of cervical or supraclavicular adenopathy. Lungs are clear to A&P. Cardiac examination is essentially unremarkable with regular rate and rhythm without murmur rub or thrill. Abdomen is benign with no organomegaly or masses noted. Motor sensory and DTR levels are equal and symmetric in the upper and lower extremities. Cranial nerves II through XII are grossly intact. Proprioception is intact. No peripheral adenopathy or edema is identified. No motor or sensory levels are noted. Crude visual fields are within normal range.  LABORATORY DATA: Pathology  report reviewed    RADIOLOGY RESULTS: CT scans PET CT scans reviewed MRI of brain for next week to complete staging   IMPRESSION: Stage IIIa adenocarcinoma of the left lung in 73 year old female for concurrent chemoradiation.  PLAN: At this time I have recommended concurrent chemoradiation therapy would plan on delivering 7000 cGy to areas of hypermetabolic activity in her left chest using IMRT treatment planning and delivery.  I would choose IMRT based on the ability to treat all areas of hypermetabolic activity which are in close proximity to her esophagus heart and spinal cord with dose constraints to minimize toxicity to those regions.  Risks and benefits of treatment including increased cough fatigue skin reaction alteration of blood counts and possible radiation esophagitis all were discussed in detail with the patient.  There will be extra effort by both professional staff as well as technical staff to coordinate and manage concurrent chemoradiation and ensuing side effects during her treatments.  I have personally set up and ordered CT simulation for later this week.  Prior to treatment will review her brain scan to make sure we are dealing with stage III disease rather than stage IV disease.  Patient comprehends my treatment plan well.  Also personally discussed the case with her daughter who also comprehends my treatment plan well.  I would like to take this opportunity to thank you for allowing me to participate in the care of your patient.Noreene Filbert, MD

## 2019-01-02 ENCOUNTER — Other Ambulatory Visit: Payer: Self-pay

## 2019-01-02 ENCOUNTER — Ambulatory Visit
Admission: RE | Admit: 2019-01-02 | Discharge: 2019-01-02 | Disposition: A | Discharge status: Home / Self Care | Payer: Medicare Other | Source: Ambulatory Visit | Attending: Radiation Oncology | Admitting: Radiation Oncology

## 2019-01-02 DIAGNOSIS — C3412 Malignant neoplasm of upper lobe, left bronchus or lung: Secondary | ICD-10-CM | POA: Diagnosis present

## 2019-01-02 NOTE — Patient Instructions (Signed)
Paclitaxel injection What is this medicine? PACLITAXEL (PAK li TAX el) is a chemotherapy drug. It targets fast dividing cells, like cancer cells, and causes these cells to die. This medicine is used to treat ovarian cancer, breast cancer, lung cancer, Kaposi's sarcoma, and other cancers. This medicine may be used for other purposes; ask your health care provider or pharmacist if you have questions. COMMON BRAND NAME(S): Onxol, Taxol What should I tell my health care provider before I take this medicine? They need to know if you have any of these conditions:  history of irregular heartbeat  liver disease  low blood counts, like low white cell, platelet, or red cell counts  lung or breathing disease, like asthma  tingling of the fingers or toes, or other nerve disorder  an unusual or allergic reaction to paclitaxel, alcohol, polyoxyethylated castor oil, other chemotherapy, other medicines, foods, dyes, or preservatives  pregnant or trying to get pregnant  breast-feeding How should I use this medicine? This drug is given as an infusion into a vein. It is administered in a hospital or clinic by a specially trained health care professional. Talk to your pediatrician regarding the use of this medicine in children. Special care may be needed. Overdosage: If you think you have taken too much of this medicine contact a poison control center or emergency room at once. NOTE: This medicine is only for you. Do not share this medicine with others. What if I miss a dose? It is important not to miss your dose. Call your doctor or health care professional if you are unable to keep an appointment. What may interact with this medicine? Do not take this medicine with any of the following medications:  disulfiram  metronidazole This medicine may also interact with the following medications:  antiviral medicines for hepatitis, HIV or AIDS  certain antibiotics like erythromycin and  clarithromycin  certain medicines for fungal infections like ketoconazole and itraconazole  certain medicines for seizures like carbamazepine, phenobarbital, phenytoin  gemfibrozil  nefazodone  rifampin  St. John's wort This list may not describe all possible interactions. Give your health care provider a list of all the medicines, herbs, non-prescription drugs, or dietary supplements you use. Also tell them if you smoke, drink alcohol, or use illegal drugs. Some items may interact with your medicine. What should I watch for while using this medicine? Your condition will be monitored carefully while you are receiving this medicine. You will need important blood work done while you are taking this medicine. This medicine can cause serious allergic reactions. To reduce your risk you will need to take other medicine(s) before treatment with this medicine. If you experience allergic reactions like skin rash, itching or hives, swelling of the face, lips, or tongue, tell your doctor or health care professional right away. In some cases, you may be given additional medicines to help with side effects. Follow all directions for their use. This drug may make you feel generally unwell. This is not uncommon, as chemotherapy can affect healthy cells as well as cancer cells. Report any side effects. Continue your course of treatment even though you feel ill unless your doctor tells you to stop. Call your doctor or health care professional for advice if you get a fever, chills or sore throat, or other symptoms of a cold or flu. Do not treat yourself. This drug decreases your body's ability to fight infections. Try to avoid being around people who are sick. This medicine may increase your risk to bruise  or bleed. Call your doctor or health care professional if you notice any unusual bleeding. Be careful brushing and flossing your teeth or using a toothpick because you may get an infection or bleed more easily.  If you have any dental work done, tell your dentist you are receiving this medicine. Avoid taking products that contain aspirin, acetaminophen, ibuprofen, naproxen, or ketoprofen unless instructed by your doctor. These medicines may hide a fever. Do not become pregnant while taking this medicine. Women should inform their doctor if they wish to become pregnant or think they might be pregnant. There is a potential for serious side effects to an unborn child. Talk to your health care professional or pharmacist for more information. Do not breast-feed an infant while taking this medicine. Men are advised not to father a child while receiving this medicine. This product may contain alcohol. Ask your pharmacist or healthcare provider if this medicine contains alcohol. Be sure to tell all healthcare providers you are taking this medicine. Certain medicines, like metronidazole and disulfiram, can cause an unpleasant reaction when taken with alcohol. The reaction includes flushing, headache, nausea, vomiting, sweating, and increased thirst. The reaction can last from 30 minutes to several hours. What side effects may I notice from receiving this medicine? Side effects that you should report to your doctor or health care professional as soon as possible:  allergic reactions like skin rash, itching or hives, swelling of the face, lips, or tongue  breathing problems  changes in vision  fast, irregular heartbeat  high or low blood pressure  mouth sores  pain, tingling, numbness in the hands or feet  signs of decreased platelets or bleeding - bruising, pinpoint red spots on the skin, black, tarry stools, blood in the urine  signs of decreased red blood cells - unusually weak or tired, feeling faint or lightheaded, falls  signs of infection - fever or chills, cough, sore throat, pain or difficulty passing urine  signs and symptoms of liver injury like dark yellow or brown urine; general ill feeling or  flu-like symptoms; light-colored stools; loss of appetite; nausea; right upper belly pain; unusually weak or tired; yellowing of the eyes or skin  swelling of the ankles, feet, hands  unusually slow heartbeat Side effects that usually do not require medical attention (report to your doctor or health care professional if they continue or are bothersome):  diarrhea  hair loss  loss of appetite  muscle or joint pain  nausea, vomiting  pain, redness, or irritation at site where injected  tiredness This list may not describe all possible side effects. Call your doctor for medical advice about side effects. You may report side effects to FDA at 1-800-FDA-1088. Where should I keep my medicine? This drug is given in a hospital or clinic and will not be stored at home. NOTE: This sheet is a summary. It may not cover all possible information. If you have questions about this medicine, talk to your doctor, pharmacist, or health care provider.  2020 [employer] Standard (2016-11-01 13:14:55) Carboplatin injection What is this medicine? CARBOPLATIN (KAR boe pla tin) is a chemotherapy drug. It targets fast dividing cells, like cancer cells, and causes these cells to die. This medicine is used to treat ovarian cancer and many other cancers. This medicine may be used for other purposes; ask your health care provider or pharmacist if you have questions. COMMON BRAND NAME(S): Paraplatin What should I tell my health care provider before I take this medicine? They need to  know if you have any of these conditions:  blood disorders  hearing problems  kidney disease  recent or ongoing radiation therapy  an unusual or allergic reaction to carboplatin, cisplatin, other chemotherapy, other medicines, foods, dyes, or preservatives  pregnant or trying to get pregnant  breast-feeding How should I use this medicine? This drug is usually given as an infusion into a vein. It is administered in a  hospital or clinic by a specially trained health care professional. Talk to your pediatrician regarding the use of this medicine in children. Special care may be needed. Overdosage: If you think you have taken too much of this medicine contact a poison control center or emergency room at once. NOTE: This medicine is only for you. Do not share this medicine with others. What if I miss a dose? It is important not to miss a dose. Call your doctor or health care professional if you are unable to keep an appointment. What may interact with this medicine?  medicines for seizures  medicines to increase blood counts like filgrastim, pegfilgrastim, sargramostim  some antibiotics like amikacin, gentamicin, neomycin, streptomycin, tobramycin  vaccines Talk to your doctor or health care professional before taking any of these medicines:  acetaminophen  aspirin  ibuprofen  ketoprofen  naproxen This list may not describe all possible interactions. Give your health care provider a list of all the medicines, herbs, non-prescription drugs, or dietary supplements you use. Also tell them if you smoke, drink alcohol, or use illegal drugs. Some items may interact with your medicine. What should I watch for while using this medicine? Your condition will be monitored carefully while you are receiving this medicine. You will need important blood work done while you are taking this medicine. This drug may make you feel generally unwell. This is not uncommon, as chemotherapy can affect healthy cells as well as cancer cells. Report any side effects. Continue your course of treatment even though you feel ill unless your doctor tells you to stop. In some cases, you may be given additional medicines to help with side effects. Follow all directions for their use. Call your doctor or health care professional for advice if you get a fever, chills or sore throat, or other symptoms of a cold or flu. Do not treat  yourself. This drug decreases your body's ability to fight infections. Try to avoid being around people who are sick. This medicine may increase your risk to bruise or bleed. Call your doctor or health care professional if you notice any unusual bleeding. Be careful brushing and flossing your teeth or using a toothpick because you may get an infection or bleed more easily. If you have any dental work done, tell your dentist you are receiving this medicine. Avoid taking products that contain aspirin, acetaminophen, ibuprofen, naproxen, or ketoprofen unless instructed by your doctor. These medicines may hide a fever. Do not become pregnant while taking this medicine. Women should inform their doctor if they wish to become pregnant or think they might be pregnant. There is a potential for serious side effects to an unborn child. Talk to your health care professional or pharmacist for more information. Do not breast-feed an infant while taking this medicine. What side effects may I notice from receiving this medicine? Side effects that you should report to your doctor or health care professional as soon as possible:  allergic reactions like skin rash, itching or hives, swelling of the face, lips, or tongue  signs of infection - fever or  chills, cough, sore throat, pain or difficulty passing urine  signs of decreased platelets or bleeding - bruising, pinpoint red spots on the skin, black, tarry stools, nosebleeds  signs of decreased red blood cells - unusually weak or tired, fainting spells, lightheadedness  breathing problems  changes in hearing  changes in vision  chest pain  high blood pressure  low blood counts - This drug may decrease the number of white blood cells, red blood cells and platelets. You may be at increased risk for infections and bleeding.  nausea and vomiting  pain, swelling, redness or irritation at the injection site  pain, tingling, numbness in the hands or  feet  problems with balance, talking, walking  trouble passing urine or change in the amount of urine Side effects that usually do not require medical attention (report to your doctor or health care professional if they continue or are bothersome):  hair loss  loss of appetite  metallic taste in the mouth or changes in taste This list may not describe all possible side effects. Call your doctor for medical advice about side effects. You may report side effects to FDA at 1-800-FDA-1088. Where should I keep my medicine? This drug is given in a hospital or clinic and will not be stored at home. NOTE: This sheet is a summary. It may not cover all possible information. If you have questions about this medicine, talk to your doctor, pharmacist, or health care provider.  2020 [employer] Standard (2007-06-05 14:38:05)

## 2019-01-03 ENCOUNTER — Inpatient Hospital Stay: Payer: Medicare Other

## 2019-01-03 ENCOUNTER — Inpatient Hospital Stay (HOSPITAL_BASED_OUTPATIENT_CLINIC_OR_DEPARTMENT_OTHER): Payer: Medicare Other | Admitting: Nurse Practitioner

## 2019-01-03 ENCOUNTER — Encounter: Payer: Self-pay | Admitting: *Deleted

## 2019-01-03 ENCOUNTER — Other Ambulatory Visit: Payer: Self-pay

## 2019-01-03 ENCOUNTER — Encounter: Payer: Self-pay | Admitting: Nurse Practitioner

## 2019-01-03 DIAGNOSIS — C3412 Malignant neoplasm of upper lobe, left bronchus or lung: Secondary | ICD-10-CM

## 2019-01-03 NOTE — Progress Notes (Signed)
[place]  [phone] [phone]  Patient Care Team: [name], MD as PCP - General (Family Medicine) Telford Nab, RN as Registered Nurse   Name of the patient: Jennifer Hodges  [id number]  [birth year]   Date of visit: 01/03/19  Diagnosis-stage III non-small cell lung cancer favoring adenocarcinoma of the left lung  Chief complaint/Reason for visit- Initial Meeting for [facility], preparing for starting chemotherapy  Heme/Onc history:  Oncology History  Malignant neoplasm of upper lobe of left lung ([place])  12/27/2018 Initial Diagnosis   Malignant neoplasm of upper lobe of left lung ([place])   12/27/2018 Cancer Staging   Staging form: Lung, AJCC 8th Edition - Clinical stage from 12/27/2018: Stage IIIA (cT1b, cN2, cM0) - Signed by Sindy Guadeloupe, MD on 12/27/2018   12/29/2018 -  Chemotherapy   The patient had PALONOSETRON HCL INJECTION 0.25 MG/5ML, 0.25 mg, Intravenous,  Once, 0 of 4 cycles CARBOplatin (PARAPLATIN) in sodium chloride 0.9 % 100 mL chemo infusion, , Intravenous,  Once, 0 of 4 cycles PACLitaxel (TAXOL) 84 mg in sodium chloride 0.9 % 250 mL chemo infusion (</= 80mg /m2), 45 mg/m2, Intravenous,  Once, 0 of 4 cycles  for chemotherapy treatment.      Interval history-Jennifer Hodges, 73 year old female, newly diagnosed with stage IIIa non-small cell lung cancer, who presents to chemo care clinic today for initial meeting in preparation for starting chemotherapy. I introduced the chemo care clinic and we discussed that the role of the clinic is to assist those who are at an increased risk of emergency room visits and/or complications during the course of chemotherapy treatment. We discussed that the increased risk takes into account factors such as age, performance status, and co-morbidities. We also discussed that for some, this might include barriers to care such as not having a primary care  provider, lack of insurance/transportation, or not being able to afford medications. We discussed that the goal of the program is to help prevent unplanned ER visits and help reduce complications during chemotherapy. We do this by discussing specific risk factors to each individual and identifying ways that we can help improve these risk factors and reduce barriers to care.   We discussed that she was identified as moderate risk of hospitalization and ER usage based on prior hospitalizations and ER visits, Medicare status, history of COPD, and history of diabetes.  She is accompanied today by her daughter who lives in [place].  ECOG FS:1 - Symptomatic but completely ambulatory  Review of systems- Review of Systems  Constitutional: Positive for chills and malaise/fatigue. Negative for fever and weight loss.  HENT: Negative for hearing loss, nosebleeds, sore throat and tinnitus.        Voice changes  Eyes: Negative for blurred vision and double vision.  Respiratory: Negative for cough, hemoptysis, shortness of breath and wheezing.   Cardiovascular: Negative for chest pain, palpitations and leg swelling.  Gastrointestinal: Negative for abdominal pain, blood in stool, constipation, diarrhea, melena, nausea and vomiting.  Genitourinary: Negative for dysuria and urgency.  Musculoskeletal: Negative for back pain, falls, joint pain and myalgias.  Skin: Negative for itching and rash.  Neurological: Negative for dizziness, tingling, sensory change, loss of consciousness, weakness and headaches.  Endo/Heme/Allergies: Negative for environmental allergies. Does not bruise/bleed easily.  Psychiatric/Behavioral: Negative for depression. The patient is not nervous/anxious and does not have insomnia.       Allergies  Allergen Reactions   Aspirin Hives and  Other (See Comments)    Difficulty breathing   Celebrex [Celecoxib] Shortness Of Breath   Morphine And Related Nausea And Vomiting and Swelling    Adhesive [Tape] Other (See Comments)    Took top layer of skin off when removed.   Clarithromycin Nausea And Vomiting   Codeine Nausea And Vomiting   Darvon [Propoxyphene Hcl] Nausea And Vomiting   Demerol [Meperidine] Nausea And Vomiting   Flonase [Fluticasone Propionate] Other (See Comments)    Fungal infection   Simvastatin Other (See Comments)    "caused ulcers in mouth, and fever"   Talwin [Pentazocine] Nausea And Vomiting    Past Medical History:  Diagnosis Date   Anemia    Asthma    No Inhalers--Dr. [name] will order as needed   Bronchiectasis ([place])    mild   Chronic headaches     followed by [facility] migraines   COPD (chronic obstructive pulmonary disease) ([place])    DDD (degenerative disc disease), lumbar    Diabetes mellitus without complication ([place])    "doctor says I no longer have diabetes"     Diverticulosis    Family history of adverse reaction to anesthesia    PONV   Gall stones    history of   GERD (gastroesophageal reflux disease)    EGD 8/09- non bleeding erosive gastritis, documentd esophageal ulcerations.    Hiatal hernia    History of pneumonia    Hypercholesterolemia    IBS (irritable bowel syndrome)    Malignant neoplasm of upper lobe of left lung ([place]) 12/27/2018   Murmur    Osteoarthritis    lumbar disc disease, left hip   PONV (postoperative nausea and vomiting)    "Only with last hip and I believe it was due to the morphine"    Sleep apnea    cpap asked to bring mask and tubing   Weakness of right side of body     Past Surgical History:  Procedure Laterality Date   ABDOMINAL HYSTERECTOMY  age 39   ANTERIOR CERVICAL DECOMP/DISCECTOMY FUSION N/A 02/24/2015   Procedure: CERVICAL FOUR-FIVE, CERVICAL FIVE-SIX, CERVICAL SIX-SEVEN ANTERIOR CERVICAL DECOMPRESSION/DISCECTOMY FUSION ;  Surgeon: [name], MD;  Location: [place] [facility];  Service: Neurosurgery;  Laterality: N/A;  C45 C56 C67 anterior cervical  decompression with fusion interbody prosthesis plating and bonegraft   APPENDECTOMY     BACK SURGERY     4th lumbar fusion   BLADDER SURGERY N/A    with vaginal wall repair   BREAST CYST ASPIRATION Bilateral    neg   BREAST SURGERY Bilateral    cyst removed and reduction   CARDIAC CATHETERIZATION  2014   CARPAL TUNNEL RELEASE Right 02/11/2016   Procedure: CARPAL TUNNEL RELEASE;  Surgeon: [name], MD;  Location: [facility] ORS;  Service: Orthopedics;  Laterality: Right;   CATARACT EXTRACTION W/ INTRAOCULAR LENS IMPLANT Bilateral 2015   CHOLECYSTECTOMY     EUS N/A 05/31/2012   Procedure: UPPER ENDOSCOPIC ULTRASOUND (EUS) LINEAR;  Surgeon: [name], MD;  Location: [facility];  Service: Endoscopy;  Laterality: N/A;   EXCISIONAL HEMORRHOIDECTOMY     JOINT REPLACEMENT Bilateral    KNEE ARTHROSCOPY WITH LATERAL MENISECTOMY Right 07/07/2015   Procedure: KNEE ARTHROSCOPY WITH LATERAL MENISECTOMY, PARTIAL SYNOVECTOMY;  Surgeon: [name], MD;  Location: [facility] ORS;  Service: Orthopedics;  Laterality: Right;   LUMBAR LAMINECTOMY     REDUCTION MAMMAPLASTY  1990   RIGHT OOPHORECTOMY     TOTAL HIP ARTHROPLASTY Left 05/01/2014  Dr. [name]   TOTAL HIP ARTHROPLASTY Right 08/05/2014   Procedure: TOTAL HIP ARTHROPLASTY ANTERIOR APPROACH;  Surgeon: [name], MD;  Location: [facility] ORS;  Service: Orthopedics;  Laterality: Right;   ULNAR NERVE TRANSPOSITION Right 02/11/2016   Procedure: ULNAR NERVE DECOMPRESSION/TRANSPOSITION;  Surgeon: [name], MD;  Location: [facility] ORS;  Service: Orthopedics;  Laterality: Right;   VIDEO BRONCHOSCOPY WITH ENDOBRONCHIAL ULTRASOUND Left 12/19/2018   Procedure: VIDEO BRONCHOSCOPY WITH ENDOBRONCHIAL ULTRASOUND, LEFT, SLEEP APNEA;  Surgeon: [name], MD;  Location: [facility] ORS;  Service: Thoracic;  Laterality: Left;    Social History   Socioeconomic History   Marital status: Married    Spouse name: Not on file   Number of  children: Not on file   Years of education: Not on file   Highest education level: Not on file  Occupational History   Not on file  Social Needs   Financial resource strain: Not on file   Food insecurity    Worry: Not on file    Inability: Not on file   Transportation needs    Medical: Not on file    Non-medical: Not on file  Tobacco Use   Smoking status: Former Smoker    Quit date: 06/13/2007    Years since quitting: 11.5   Smokeless tobacco: Never Used  Substance and Sexual Activity   Alcohol use: No    Alcohol/week: 0.0 standard drinks   Drug use: No   Sexual activity: Never  Lifestyle   Physical activity    Days per week: Not on file    Minutes per session: Not on file   Stress: Not on file  Relationships   Social connections    Talks on phone: Not on file    Gets together: Not on file    Attends religious service: Not on file    Active member of club or organization: Not on file    Attends meetings of clubs or organizations: Not on file    Relationship status: Not on file   Intimate partner violence    Fear of current or ex partner: Not on file    Emotionally abused: Not on file    Physically abused: Not on file    Forced sexual activity: Not on file  Other Topics Concern   Not on file  Social History Narrative   Not on file    Family History  Problem Relation Age of Onset   Heart disease Mother        s/p stent   Hypertension Mother    Hypercholesterolemia Mother    Diabetes Father    Stomach cancer Other        uncle   Breast cancer Cousin    Breast cancer Paternal Aunt      Current Outpatient Medications:    acetaminophen (TYLENOL) 500 MG tablet, Take 500 mg by mouth every 6 (six) hours as needed for mild pain or moderate pain. Reported on 04/06/2015, Disp: , Rfl:    albuterol (PROVENTIL) (2.5 MG/3ML) 0.083% nebulizer solution, Take 2.5 mg by nebulization every 6 (six) hours as needed for wheezing., Disp: , Rfl:     albuterol (VENTOLIN HFA) 108 (90 Base) MCG/ACT inhaler, Inhale 2 puffs into the lungs every 6 (six) hours as needed for wheezing or shortness of breath., Disp: 18 g, Rfl: 0   benzonatate (TESSALON) 100 MG capsule, Take 1 capsule (100 mg total) by mouth 3 (three) times daily as needed for cough., Disp: 21 capsule, Rfl: 0  Bioflavonoid Products (ESTER C PO), Take 1 tablet by mouth 3 (three) times daily., Disp: , Rfl:    Biotin 10000 MCG TABS, Take 10,000 mcg by mouth daily., Disp: , Rfl:    Calcium-Magnesium-Zinc (CAL-MAG-ZINC PO), Take 1 tablet by mouth daily., Disp: , Rfl:    cetirizine (ZYRTEC) 5 MG tablet, Take 1 tablet (5 mg total) by mouth daily. (Patient taking differently: Take 5 mg by mouth daily as needed (runny nose.). ), Disp: 30 tablet, Rfl: 0   CHLORASEPTIC MAX SORE THROAT 1.5-33 % LIQD, Use as directed 1 spray in the mouth or throat as needed (throat irritation.)., Disp: , Rfl:    Cholecalciferol (VITAMIN D3) 25 MCG (1000 UT) CHEW, Chew 5,000 Units by mouth daily., Disp: , Rfl:    Coenzyme Q10 (CO Q 10) 100 MG CAPS, Take 100 mg by mouth daily. , Disp: , Rfl:    Cyanocobalamin (VITAMIN B-12) 2500 MCG SUBL, Place 2,500 mcg under the tongue daily., Disp: , Rfl:    cyclobenzaprine (FLEXERIL) 10 MG tablet, Take 10 mg by mouth at bedtime. , Disp: , Rfl:    dexamethasone (DECADRON) 4 MG tablet, Take 2 tablets (8 mg total) by mouth daily. Start the day after chemotherapy for 2 days., Disp: 30 tablet, Rfl: 1   DHEA 25 MG CAPS, Take 25 mg by mouth daily., Disp: , Rfl:    ezetimibe (ZETIA) 10 MG tablet, Take 10 mg by mouth at bedtime., Disp: , Rfl:    folic acid (FOLVITE) 093 MCG tablet, Take 400 mcg by mouth 2 (two) times daily. , Disp: , Rfl:    Garlic 2355 MG CAPS, Take 1,000 mg by mouth daily., Disp: , Rfl:    Histamine Dihydrochloride (AUSTRALIAN DREAM ARTHRITIS EX), Apply 1 application topically 4 (four) times daily as needed (pain.)., Disp: , Rfl:     HYDROcodone-acetaminophen (NORCO/VICODIN) 5-325 MG tablet, 1-2 tabs po bid prn (Patient taking differently: Take 1 tablet by mouth at bedtime. ), Disp: 10 tablet, Rfl: 0   HYDROcodone-homatropine (HYCODAN) 5-1.5 MG/5ML syrup, Take 5 mLs by mouth 3 (three) times daily as needed for cough., Disp: 120 mL, Rfl: 0   ipratropium (ATROVENT) 0.06 % nasal spray, Place 2 sprays into both nostrils 4 (four) times daily as needed for rhinitis., Disp: 15 mL, Rfl: 0   lidocaine-prilocaine (EMLA) cream, Apply to affected area once, Disp: 30 g, Rfl: 3   LORazepam (ATIVAN) 0.5 MG tablet, Take 1 tablet (0.5 mg total) by mouth every 6 (six) hours as needed (Nausea or vomiting)., Disp: 30 tablet, Rfl: 0   montelukast (SINGULAIR) 10 MG tablet, Take 10 mg by mouth at bedtime. , Disp: , Rfl:    ondansetron (ZOFRAN) 8 MG tablet, Take 1 tablet (8 mg total) by mouth 2 (two) times daily as needed for refractory nausea / vomiting. Start on day 3 after chemo., Disp: 30 tablet, Rfl: 1   ondansetron (ZOFRAN-ODT) 4 MG disintegrating tablet, Take 4 mg by mouth every 8 (eight) hours as needed for nausea/vomiting., Disp: , Rfl:    pantoprazole (PROTONIX) 40 MG tablet, Take 1 tablet (40 mg total) by mouth 2 (two) times daily before a meal., Disp: 60 tablet, Rfl: 1   Polyethyl Glycol-Propyl Glycol (SYSTANE) 0.4-0.3 % SOLN, Place 1 drop into both eyes 5 (five) times daily as needed (dry/irritated eyes). , Disp: , Rfl:    prochlorperazine (COMPAZINE) 10 MG tablet, Take 1 tablet (10 mg total) by mouth every 6 (six) hours as needed (Nausea or vomiting)., Disp:  30 tablet, Rfl: 1   pyridOXINE (VITAMIN B-6) 100 MG tablet, Take 100 mg by mouth daily., Disp: , Rfl:    umeclidinium-vilanterol (ANORO ELLIPTA) 62.5-25 MCG/INH AEPB, Inhale into the lungs., Disp: , Rfl:    vitamin E 400 UNIT capsule, Take 400 Units by mouth daily., Disp: , Rfl:    Zoledronic Acid (RECLAST IV), Inject 1 Dose into the vein as directed. ONCE A YEAR, Disp: ,  Rfl:   Physical exam: There were no vitals filed for this visit. Physical Exam Constitutional:      General: She is not in acute distress.    Comments: Accompanied by daughter  HENT:     Head: Normocephalic and atraumatic.     Mouth/Throat:     Comments: Hoarseness Eyes:     General: No scleral icterus.    Conjunctiva/sclera: Conjunctivae normal.  Pulmonary:     Effort: No respiratory distress.  Abdominal:     Tenderness: There is no guarding.  Musculoskeletal:        General: No swelling or deformity.  Skin:    General: Skin is dry.     Coloration: Skin is not pale.  Neurological:     Mental Status: She is alert and oriented to person, place, and time. Mental status is at baseline.  Psychiatric:        Mood and Affect: Mood normal.        Judgment: Judgment normal.      CMP Latest Ref Rng & Units 12/27/2018  Glucose 70 - 99 mg/dL 105(H)  BUN 8 - 23 mg/dL 8  Creatinine 0.44 - 1.00 mg/dL 0.45  Sodium 135 - 145 mmol/L 139  Potassium 3.5 - 5.1 mmol/L 4.4  Chloride 98 - 111 mmol/L 104  CO2 22 - 32 mmol/L 27  Calcium 8.9 - 10.3 mg/dL 9.4  Total Protein 6.5 - 8.1 g/dL 7.0  Total Bilirubin 0.3 - 1.2 mg/dL 0.4  Alkaline Phos 38 - 126 U/L 79  AST 15 - 41 U/L 15  ALT 0 - 44 U/L 17   CBC Latest Ref Rng & Units 12/27/2018  WBC 4.0 - 10.5 K/uL 9.4  Hemoglobin 12.0 - 15.0 g/dL 13.4  Hematocrit 36.0 - 46.0 % 40.9  Platelets 150 - 400 K/uL 245    No images are attached to the encounter.  Dg C-arm 1-60 Min-no Report  Result Date: 12/19/2018 Fluoroscopy was utilized by the requesting physician.  No radiographic interpretation.   Ct Super D Chest Wo Contrast  Result Date: 12/19/2018 CLINICAL DATA:  73 year old female with history of cough and shortness of breath for the past 2-3 months. EXAM: CT CHEST WITHOUT CONTRAST TECHNIQUE: Multidetector CT imaging of the chest was performed using thin slice collimation for electromagnetic bronchoscopy planning purposes, without  intravenous contrast. COMPARISON:  Chest CT 11/20/2018.  PET-CT 12/03/2018. FINDINGS: Cardiovascular: Heart size is normal. There is no significant pericardial fluid, thickening or pericardial calcification. There is aortic atherosclerosis, as well as atherosclerosis of the great vessels of the mediastinum and the coronary arteries, including calcified atherosclerotic plaque in the left anterior descending, left circumflex and right coronary arteries. Mediastinum/Nodes: Worsening mediastinal lymphadenopathy. Specific examples include a 1.9 cm short axis AP window lymph node (axial image 20 of series 2), previously 1.8 cm. Bulky prevascular lymph nodes measuring 2.0 x 5.9 cm (axial image 18 of series 2), previously 1.9 x 5.0 cm. Borderline enlarged low right paratracheal lymph node measuring 1.2 cm in short axis (previously only 7 mm). Esophagus is unremarkable  in appearance. No axillary lymphadenopathy Lungs/Pleura: Multiple pulmonary nodules are noted in the lungs bilaterally, most of which appear increased in size compared to the prior examination. The largest of these is in the medial aspect of the left upper lobe (axial image 20 of series 2 and coronal image 32 of series 6) measuring 2.1 x 1.7 x 2.3 cm, previously 2.0 x 1.7 x 1.8 cm. Clustered pulmonary nodules in the right lower lobe generally appear increased in size compared to the prior examination, best demonstrated by 9 mm right lower lobe nodule (axial image 40 of series 3), which previously measured 8 mm, and 8 mm right lower lobe nodule (axial image 45 of series 3), previously 6 mm). No acute consolidative airspace disease. No pleural effusions. Upper Abdomen: Aortic atherosclerosis. Musculoskeletal: Orthopedic fixation hardware in the lower cervical spine incidentally noted. There are no aggressive appearing lytic or blastic lesions noted in the visualized portions of the skeleton. IMPRESSION: 1. Progression of disease as evidenced by enlargement of  previously noted pulmonary nodules in the lungs bilaterally, as well as progressive mediastinal lymphadenopathy, as detailed above. 2. Aortic atherosclerosis, in addition to two vessel coronary artery disease. Assessment for potential risk factor modification, dietary therapy or pharmacologic therapy may be warranted, if clinically indicated. Aortic Atherosclerosis (ICD10-I70.0). Electronically Signed   By: Vinnie Langton M.D.   On: 12/19/2018 10:18     Assessment and plan- Patient is a 73 y.o. female who presents to [facility] for initial meeting in preparation for starting chemotherapy for the treatment of lung cancer.   1. Stage IIIa non-small cell lung cancer, favoring adenocarcinoma of the left lung- Managed by Dr. Janese Banks. Plan for concurrent chemo (carbo AUC 2 and taxol 45 mg/m2) with radiation.   2. Chemo Care Clinic/High Risk for ER/Hospitalization during chemotherapy- We discussed the role of the chemo care clinic and identified patient specific risk factors. I discussed that patient was identified as high risk primarily based on: Previous hospitalizations and ER visits, Medicare status, history of COPD and diabetes.  She has a PCP whom she sees regularly.  COPD managed by Dr. [name].  She has a nebulizer at home and denies needing any refills today.  Says that her diabetes has been diet controlled.  Last A1c was 5.7.  We discussed options for accessing care including symptom management clinic, primary care, urgent care, ER.  3. Social Determinants of Health- we discussed that social determinants of health may have significant impacts on health and outcomes for cancer patients.  Today we discussed specific social determinants of performance status, alcohol use, depression, financial needs, food insecurity, housing, interpersonal violence, social connections, stress, tobacco use, and transportation.  After lengthy discussion she declined any specific needs today. We discussed programs  available at the Cancer center including option assistance with performance status, depression, financial insecurity, food insecurity, housing insecurity, lack of social connections, interpersonal violence, alcohol or tobacco use, stress management, and need for transportation.   4. Palliative Care- based on stage of cancer and/or identified needs today, I will refer patient to palliative care for goals of care and advanced care planning.  We also discussed the role of the Symptom Management Clinic at [facility] for acute issues and methods of contacting clinic/provider. She denies needing specific assistance at this time and She will be followed by Telford Nab, RN (Nurse Navigator).   Visit Diagnosis 1. Malignant neoplasm of upper lobe of left lung [facility])    Patient expressed understanding and was in agreement  with this plan. She also understands that She can call clinic at any time with any questions, concerns, or complaints.   A total of (15) minutes of face-to-face time was spent with this patient with greater than 50% of that time in counseling and care-coordination.  Beckey Rutter, DNP, AGNP-C [facility] (work cell) [phone] (office)  CC: Billey Chang, NP

## 2019-01-04 ENCOUNTER — Other Ambulatory Visit
Admission: RE | Admit: 2019-01-04 | Discharge: 2019-01-04 | Disposition: A | Discharge status: Home / Self Care | Payer: Medicare Other | Source: Ambulatory Visit | Attending: Vascular Surgery | Admitting: Vascular Surgery

## 2019-01-04 DIAGNOSIS — Z01812 Encounter for preprocedural laboratory examination: Secondary | ICD-10-CM | POA: Insufficient documentation

## 2019-01-04 DIAGNOSIS — Z20828 Contact with and (suspected) exposure to other viral communicable diseases: Secondary | ICD-10-CM | POA: Diagnosis not present

## 2019-01-04 LAB — SARS CORONAVIRUS 2 (TAT 6-24 HRS): SARS Coronavirus 2: NEGATIVE

## 2019-01-05 ENCOUNTER — Other Ambulatory Visit: Payer: Self-pay | Admitting: Oncology

## 2019-01-05 DIAGNOSIS — C3412 Malignant neoplasm of upper lobe, left bronchus or lung: Secondary | ICD-10-CM

## 2019-01-06 ENCOUNTER — Other Ambulatory Visit (INDEPENDENT_AMBULATORY_CARE_PROVIDER_SITE_OTHER): Payer: Self-pay | Admitting: Nurse Practitioner

## 2019-01-06 MED ORDER — CEFAZOLIN SODIUM-DEXTROSE 2-4 GM/100ML-% IV SOLN
2.0000 g | Freq: Once | INTRAVENOUS | Status: AC
  Administered 2019-01-07: 10:00:00 2 g via INTRAVENOUS

## 2019-01-07 ENCOUNTER — Other Ambulatory Visit: Payer: Self-pay

## 2019-01-07 ENCOUNTER — Encounter
Admission: RE | Disposition: A | Discharge status: Home / Self Care | Payer: Self-pay | Source: Home / Self Care | Attending: Vascular Surgery

## 2019-01-07 ENCOUNTER — Ambulatory Visit
Admission: RE | Admit: 2019-01-07 | Discharge: 2019-01-07 | Disposition: A | Discharge status: Home / Self Care | Payer: Medicare Other | Attending: Vascular Surgery | Admitting: Vascular Surgery

## 2019-01-07 ENCOUNTER — Encounter: Payer: Self-pay | Admitting: Oncology

## 2019-01-07 DIAGNOSIS — E7800 Pure hypercholesterolemia, unspecified: Secondary | ICD-10-CM | POA: Insufficient documentation

## 2019-01-07 DIAGNOSIS — Z885 Allergy status to narcotic agent status: Secondary | ICD-10-CM | POA: Diagnosis not present

## 2019-01-07 DIAGNOSIS — Z881 Allergy status to other antibiotic agents status: Secondary | ICD-10-CM | POA: Insufficient documentation

## 2019-01-07 DIAGNOSIS — Z800 Family history of malignant neoplasm of digestive organs: Secondary | ICD-10-CM | POA: Insufficient documentation

## 2019-01-07 DIAGNOSIS — Z96643 Presence of artificial hip joint, bilateral: Secondary | ICD-10-CM | POA: Insufficient documentation

## 2019-01-07 DIAGNOSIS — Z886 Allergy status to analgesic agent status: Secondary | ICD-10-CM | POA: Insufficient documentation

## 2019-01-07 DIAGNOSIS — Z888 Allergy status to other drugs, medicaments and biological substances status: Secondary | ICD-10-CM | POA: Diagnosis not present

## 2019-01-07 DIAGNOSIS — M1990 Unspecified osteoarthritis, unspecified site: Secondary | ICD-10-CM | POA: Insufficient documentation

## 2019-01-07 DIAGNOSIS — Z803 Family history of malignant neoplasm of breast: Secondary | ICD-10-CM | POA: Insufficient documentation

## 2019-01-07 DIAGNOSIS — Z833 Family history of diabetes mellitus: Secondary | ICD-10-CM | POA: Insufficient documentation

## 2019-01-07 DIAGNOSIS — Z79899 Other long term (current) drug therapy: Secondary | ICD-10-CM | POA: Diagnosis not present

## 2019-01-07 DIAGNOSIS — K589 Irritable bowel syndrome without diarrhea: Secondary | ICD-10-CM | POA: Insufficient documentation

## 2019-01-07 DIAGNOSIS — G4730 Sleep apnea, unspecified: Secondary | ICD-10-CM | POA: Diagnosis not present

## 2019-01-07 DIAGNOSIS — J449 Chronic obstructive pulmonary disease, unspecified: Secondary | ICD-10-CM | POA: Insufficient documentation

## 2019-01-07 DIAGNOSIS — Z87891 Personal history of nicotine dependence: Secondary | ICD-10-CM | POA: Insufficient documentation

## 2019-01-07 DIAGNOSIS — C3490 Malignant neoplasm of unspecified part of unspecified bronchus or lung: Secondary | ICD-10-CM

## 2019-01-07 DIAGNOSIS — Z8249 Family history of ischemic heart disease and other diseases of the circulatory system: Secondary | ICD-10-CM | POA: Diagnosis not present

## 2019-01-07 DIAGNOSIS — K219 Gastro-esophageal reflux disease without esophagitis: Secondary | ICD-10-CM | POA: Diagnosis not present

## 2019-01-07 DIAGNOSIS — C3412 Malignant neoplasm of upper lobe, left bronchus or lung: Secondary | ICD-10-CM | POA: Diagnosis present

## 2019-01-07 HISTORY — PX: PORTA CATH INSERTION: CATH118285

## 2019-01-07 SURGERY — PORTA CATH INSERTION
Anesthesia: Moderate Sedation

## 2019-01-07 MED ORDER — FENTANYL CITRATE (PF) 100 MCG/2ML IJ SOLN: 12.5000 ug | Freq: Once | INTRAMUSCULAR | Status: DC | PRN

## 2019-01-07 MED ORDER — DIPHENHYDRAMINE HCL 50 MG/ML IJ SOLN: 50.0000 mg | Freq: Once | INTRAMUSCULAR | Status: DC | PRN

## 2019-01-07 MED ORDER — MIDAZOLAM HCL 2 MG/ML PO SYRP: 8.0000 mg | ORAL_SOLUTION | Freq: Once | ORAL | Status: DC | PRN

## 2019-01-07 MED ORDER — FENTANYL CITRATE (PF) 100 MCG/2ML IJ SOLN
INTRAMUSCULAR | Status: AC
  Filled 2019-01-07: qty 2

## 2019-01-07 MED ORDER — SODIUM CHLORIDE 0.9 % IV SOLN
Freq: Once | INTRAVENOUS | Status: AC
  Administered 2019-01-07: 11:00:00
  Filled 2019-01-07: qty 80

## 2019-01-07 MED ORDER — MIDAZOLAM HCL 5 MG/5ML IJ SOLN
INTRAMUSCULAR | Status: AC
  Filled 2019-01-07: qty 5

## 2019-01-07 MED ORDER — ONDANSETRON HCL 4 MG/2ML IJ SOLN: 4.0000 mg | Freq: Four times a day (QID) | INTRAMUSCULAR | Status: DC | PRN

## 2019-01-07 MED ORDER — CEFAZOLIN SODIUM-DEXTROSE 2-4 GM/100ML-% IV SOLN
INTRAVENOUS | Status: AC
  Administered 2019-01-07: 10:00:00 2 g via INTRAVENOUS
  Filled 2019-01-07: qty 100

## 2019-01-07 MED ORDER — METHYLPREDNISOLONE SODIUM SUCC 125 MG IJ SOLR: 125.0000 mg | Freq: Once | INTRAMUSCULAR | Status: DC | PRN

## 2019-01-07 MED ORDER — FENTANYL CITRATE (PF) 100 MCG/2ML IJ SOLN
INTRAMUSCULAR | Status: DC | PRN
  Administered 2019-01-07 (×2): 50 ug via INTRAVENOUS

## 2019-01-07 MED ORDER — MIDAZOLAM HCL 2 MG/2ML IJ SOLN
INTRAMUSCULAR | Status: DC | PRN
  Administered 2019-01-07: 1 mg via INTRAVENOUS
  Administered 2019-01-07: 2 mg via INTRAVENOUS

## 2019-01-07 MED ORDER — FAMOTIDINE 20 MG PO TABS: 40.0000 mg | ORAL_TABLET | Freq: Once | ORAL | Status: DC | PRN

## 2019-01-07 MED ORDER — SODIUM CHLORIDE 0.9 % IV SOLN: INTRAVENOUS | Status: DC

## 2019-01-07 SURGICAL SUPPLY — 12 items
DERMABOND ADVANCED (GAUZE/BANDAGES/DRESSINGS) ×1
DERMABOND ADVANCED .7 DNX12 (GAUZE/BANDAGES/DRESSINGS) ×1 IMPLANT
GLIDEWIRE STIFF .35X180X3 HYDR (WIRE) ×2 IMPLANT
KIT PORT POWER 8FR ISP CVUE (Port) ×2 IMPLANT
PACK ANGIOGRAPHY (CUSTOM PROCEDURE TRAY) ×2 IMPLANT
PAD GROUND ADULT SPLIT (MISCELLANEOUS) ×2 IMPLANT
PENCIL ELECTRO HAND CTR (MISCELLANEOUS) ×2 IMPLANT
SHEATH PEEL AWAY 10FRX38 (SHEATH) ×2 IMPLANT
SUT MNCRL AB 4-0 PS2 18 (SUTURE) ×2 IMPLANT
SUT PROLENE 0 CT 1 30 (SUTURE) ×4 IMPLANT
SUT VIC AB 3-0 SH 27 (SUTURE) ×1
SUT VIC AB 3-0 SH 27X BRD (SUTURE) ×1 IMPLANT

## 2019-01-07 NOTE — H&P (Signed)
[facility] History & Physical Update  The patient was interviewed and re-examined.  The patient's previous History and Physical has been reviewed and is unchanged.  There is no change in the plan of care. We plan to proceed with the scheduled procedure.  Leotis Pain, MD  01/07/2019, 8:52 AM

## 2019-01-07 NOTE — Op Note (Signed)
      [facility]       Operative Note  Date: 01/07/2019  Preoperative diagnosis:  1. Lung cancer  Postoperative diagnosis:  Same as above  Procedures: #1. Ultrasound guidance for vascular access to the right internal jugular vein. #2. Fluoroscopic guidance for placement of catheter. #3. Placement of CT compatible Port-A-Cath, right internal jugular vein.  Surgeon: Leotis Pain, MD.   Anesthesia: Local with moderate conscious sedation for approximately 35  minutes using 3 mg of Versed and 100 mcg of Fentanyl  Fluoroscopy time: less than 1 minute  Contrast used: 0  Estimated blood loss: 5 cc  Indication for the procedure:  The patient is a 73 y.o.female with lung cancwer.  The patient needs a Port-A-Cath for durable venous access, chemotherapy, lab draws, and CT scans. We are asked to place this. Risks and benefits were discussed and informed consent was obtained.  Description of procedure: The patient was brought to the vascular and interventional radiology suite.  Moderate conscious sedation was administered throughout the procedure during a face to face encounter with the patient with my supervision of the RN administering medicines and monitoring the patient's vital signs, pulse oximetry, telemetry and mental status throughout from the start of the procedure until the patient was taken to the recovery room. The right neck chest and shoulder were sterilely prepped and draped, and a sterile surgical field was created. Ultrasound was used to help visualize a patent right internal jugular vein. This was then accessed under direct ultrasound guidance without difficulty with the Seldinger needle and a permanent image was recorded. A J-wire was placed. After skin nick and dilatation, the peel-away sheath was then placed over the wire. I then anesthetized an area under the clavicle approximately 1-2 fingerbreadths. A transverse incision was created and an inferior pocket was  created with electrocautery and blunt dissection. The port was then brought onto the field, placed into the pocket and secured to the chest wall with 2 Prolene sutures. The catheter was connected to the port and tunneled from the subclavicular incision to the access site. Fluoroscopic guidance was then used to cut the catheter to an appropriate length. The catheter was then placed through the peel-away sheath and the peel-away sheath was removed. The catheter tip was parked in excellent location under fluorocoscopic guidance in the cavoatrial junction. The pocket was then irrigated with antibiotic impregnated saline and the wound was closed with a running 3-0 Vicryl and a 4-0 Monocryl. The access incision was closed with a single 4-0 Monocryl. The Huber needle was used to withdraw blood and flush the port with heparinized saline. Dermabond was then placed as a dressing. The patient tolerated the procedure well and was taken to the recovery room in stable condition.   Leotis Pain 01/07/2019 11:12 AM   This note was created with Dragon Medical transcription system. Any errors in dictation are purely unintentional.

## 2019-01-08 ENCOUNTER — Encounter: Payer: Self-pay | Admitting: Oncology

## 2019-01-09 ENCOUNTER — Other Ambulatory Visit: Payer: Self-pay

## 2019-01-09 ENCOUNTER — Ambulatory Visit
Admission: RE | Admit: 2019-01-09 | Discharge: 2019-01-09 | Disposition: A | Discharge status: Home / Self Care | Payer: Medicare Other | Source: Ambulatory Visit | Attending: Oncology | Admitting: Oncology

## 2019-01-09 DIAGNOSIS — C3412 Malignant neoplasm of upper lobe, left bronchus or lung: Secondary | ICD-10-CM | POA: Diagnosis present

## 2019-01-09 MED ORDER — GADOBUTROL 1 MMOL/ML IV SOLN
7.5000 mL | Freq: Once | INTRAVENOUS | Status: AC | PRN
  Administered 2019-01-09: 7.5 mL via INTRAVENOUS

## 2019-01-10 ENCOUNTER — Other Ambulatory Visit: Payer: Medicare Other

## 2019-01-10 ENCOUNTER — Other Ambulatory Visit: Payer: Self-pay

## 2019-01-10 ENCOUNTER — Ambulatory Visit: Payer: Medicare Other | Admitting: Oncology

## 2019-01-10 ENCOUNTER — Ambulatory Visit: Payer: Medicare Other

## 2019-01-10 ENCOUNTER — Ambulatory Visit
Admission: RE | Admit: 2019-01-10 | Discharge: 2019-01-10 | Disposition: A | Discharge status: Home / Self Care | Payer: Medicare Other | Source: Ambulatory Visit | Attending: Radiation Oncology | Admitting: Radiation Oncology

## 2019-01-11 ENCOUNTER — Other Ambulatory Visit: Payer: Self-pay

## 2019-01-11 ENCOUNTER — Encounter: Payer: Self-pay | Admitting: Oncology

## 2019-01-11 DIAGNOSIS — G43909 Migraine, unspecified, not intractable, without status migrainosus: Secondary | ICD-10-CM | POA: Insufficient documentation

## 2019-01-11 DIAGNOSIS — J984 Other disorders of lung: Secondary | ICD-10-CM | POA: Insufficient documentation

## 2019-01-11 DIAGNOSIS — L509 Urticaria, unspecified: Secondary | ICD-10-CM | POA: Insufficient documentation

## 2019-01-11 DIAGNOSIS — N63 Unspecified lump in unspecified breast: Secondary | ICD-10-CM | POA: Insufficient documentation

## 2019-01-11 DIAGNOSIS — M109 Gout, unspecified: Secondary | ICD-10-CM | POA: Insufficient documentation

## 2019-01-11 NOTE — Progress Notes (Signed)
Patient stated that she has a question for the physician in reference to her chemo.

## 2019-01-14 ENCOUNTER — Other Ambulatory Visit: Payer: Self-pay | Admitting: Otolaryngology

## 2019-01-14 ENCOUNTER — Ambulatory Visit
Admission: RE | Admit: 2019-01-14 | Discharge: 2019-01-14 | Disposition: A | Discharge status: Home / Self Care | Payer: Medicare Other | Source: Ambulatory Visit | Attending: Radiation Oncology | Admitting: Radiation Oncology

## 2019-01-14 ENCOUNTER — Encounter: Payer: Self-pay | Admitting: *Deleted

## 2019-01-14 ENCOUNTER — Inpatient Hospital Stay: Payer: Medicare Other | Attending: Oncology

## 2019-01-14 ENCOUNTER — Other Ambulatory Visit: Payer: Self-pay

## 2019-01-14 ENCOUNTER — Inpatient Hospital Stay (HOSPITAL_BASED_OUTPATIENT_CLINIC_OR_DEPARTMENT_OTHER): Payer: Medicare Other | Admitting: Oncology

## 2019-01-14 ENCOUNTER — Inpatient Hospital Stay: Payer: Medicare Other

## 2019-01-14 VITALS — BP 119/67 | HR 83 | Temp 98.2°F | Resp 16 | Ht 62.0 in | Wt 168.1 lb

## 2019-01-14 VITALS — BP 135/63 | HR 74 | Temp 98.5°F | Resp 18

## 2019-01-14 DIAGNOSIS — C3412 Malignant neoplasm of upper lobe, left bronchus or lung: Secondary | ICD-10-CM | POA: Diagnosis present

## 2019-01-14 DIAGNOSIS — R309 Painful micturition, unspecified: Secondary | ICD-10-CM | POA: Insufficient documentation

## 2019-01-14 DIAGNOSIS — Z5111 Encounter for antineoplastic chemotherapy: Secondary | ICD-10-CM

## 2019-01-14 DIAGNOSIS — R490 Dysphonia: Secondary | ICD-10-CM

## 2019-01-14 DIAGNOSIS — J3801 Paralysis of vocal cords and larynx, unilateral: Secondary | ICD-10-CM

## 2019-01-14 LAB — CBC WITH DIFFERENTIAL/PLATELET
Abs Immature Granulocytes: 0.05 10*3/uL (ref 0.00–0.07)
Basophils Absolute: 0.1 10*3/uL (ref 0.0–0.1)
Basophils Relative: 1 %
Eosinophils Absolute: 1.5 10*3/uL — ABNORMAL HIGH (ref 0.0–0.5)
Eosinophils Relative: 12 %
HCT: 38.6 % (ref 36.0–46.0)
Hemoglobin: 12.8 g/dL (ref 12.0–15.0)
Immature Granulocytes: 0 %
Lymphocytes Relative: 15 %
Lymphs Abs: 1.8 10*3/uL (ref 0.7–4.0)
MCH: 30.3 pg (ref 26.0–34.0)
MCHC: 33.2 g/dL (ref 30.0–36.0)
MCV: 91.3 fL (ref 80.0–100.0)
Monocytes Absolute: 0.8 10*3/uL (ref 0.1–1.0)
Monocytes Relative: 6 %
Neutro Abs: 7.9 10*3/uL — ABNORMAL HIGH (ref 1.7–7.7)
Neutrophils Relative %: 66 %
Platelets: 238 10*3/uL (ref 150–400)
RBC: 4.23 MIL/uL (ref 3.87–5.11)
RDW: 12.2 % (ref 11.5–15.5)
WBC: 12.1 10*3/uL — ABNORMAL HIGH (ref 4.0–10.5)
nRBC: 0 % (ref 0.0–0.2)

## 2019-01-14 LAB — COMPREHENSIVE METABOLIC PANEL
ALT: 17 U/L (ref 0–44)
AST: 14 U/L — ABNORMAL LOW (ref 15–41)
Albumin: 4 g/dL (ref 3.5–5.0)
Alkaline Phosphatase: 79 U/L (ref 38–126)
Anion gap: 10 (ref 5–15)
BUN: 12 mg/dL (ref 8–23)
CO2: 25 mmol/L (ref 22–32)
Calcium: 8.8 mg/dL — ABNORMAL LOW (ref 8.9–10.3)
Chloride: 104 mmol/L (ref 98–111)
Creatinine, Ser: 0.54 mg/dL (ref 0.44–1.00)
GFR calc Af Amer: >60 mL/min (ref >60)
GFR calc non Af Amer: >60 mL/min (ref >60)
Glucose, Bld: 103 mg/dL — ABNORMAL HIGH (ref 70–99)
Potassium: 4.2 mmol/L (ref 3.5–5.1)
Sodium: 139 mmol/L (ref 135–145)
Total Bilirubin: 0.5 mg/dL (ref 0.3–1.2)
Total Protein: 6.9 g/dL (ref 6.5–8.1)

## 2019-01-14 MED ORDER — DIPHENHYDRAMINE HCL 50 MG/ML IJ SOLN
50.0000 mg | Freq: Once | INTRAMUSCULAR | Status: AC
  Administered 2019-01-14: 50 mg via INTRAVENOUS
  Filled 2019-01-14: qty 1

## 2019-01-14 MED ORDER — HEPARIN SOD (PORK) LOCK FLUSH 100 UNIT/ML IV SOLN
500.0000 [IU] | Freq: Once | INTRAVENOUS | Status: AC
  Administered 2019-01-14: 500 [IU] via INTRAVENOUS
  Filled 2019-01-14: qty 5

## 2019-01-14 MED ORDER — SODIUM CHLORIDE 0.9% FLUSH
10.0000 mL | INTRAVENOUS | Status: DC | PRN
  Administered 2019-01-14: 10 mL via INTRAVENOUS
  Filled 2019-01-14: qty 10

## 2019-01-14 MED ORDER — SODIUM CHLORIDE 0.9 % IV SOLN
20.0000 mg | Freq: Once | INTRAVENOUS | Status: AC
  Administered 2019-01-14: 20 mg via INTRAVENOUS
  Filled 2019-01-14: qty 2

## 2019-01-14 MED ORDER — HEPARIN SOD (PORK) LOCK FLUSH 100 UNIT/ML IV SOLN
500.0000 [IU] | Freq: Once | INTRAVENOUS | Status: DC | PRN
  Filled 2019-01-14: qty 5

## 2019-01-14 MED ORDER — SODIUM CHLORIDE 0.9 % IV SOLN
171.2000 mg | Freq: Once | INTRAVENOUS | Status: AC
  Administered 2019-01-14: 170 mg via INTRAVENOUS
  Filled 2019-01-14: qty 17

## 2019-01-14 MED ORDER — FAMOTIDINE IN NACL 20-0.9 MG/50ML-% IV SOLN
20.0000 mg | Freq: Once | INTRAVENOUS | Status: AC
  Administered 2019-01-14: 20 mg via INTRAVENOUS
  Filled 2019-01-14: qty 50

## 2019-01-14 MED ORDER — PALONOSETRON HCL INJECTION 0.25 MG/5ML
0.2500 mg | Freq: Once | INTRAVENOUS | Status: AC
  Administered 2019-01-14: 0.25 mg via INTRAVENOUS
  Filled 2019-01-14: qty 5

## 2019-01-14 MED ORDER — SODIUM CHLORIDE 0.9 % IV SOLN
45.0000 mg/m2 | Freq: Once | INTRAVENOUS | Status: AC
  Administered 2019-01-14: 84 mg via INTRAVENOUS
  Filled 2019-01-14: qty 14

## 2019-01-14 MED ORDER — SODIUM CHLORIDE 0.9 % IV SOLN
Freq: Once | INTRAVENOUS | Status: AC
  Administered 2019-01-14: 10:00:00 via INTRAVENOUS
  Filled 2019-01-14: qty 250

## 2019-01-14 NOTE — Progress Notes (Signed)
Pt tolerated infusion well. Pt stable at discharge. 

## 2019-01-14 NOTE — Progress Notes (Signed)
  Oncology Nurse Navigator Documentation  Navigator Location: [facility] (01/14/19 1100)   )Navigator Encounter Type: Treatment (01/14/19 1100)                   Treatment Initiated Date: 01/14/19 (01/14/19 1100) Patient Visit Type: MedOnc (01/14/19 1100) Treatment Phase: First Chemo Tx;First Radiation Tx (01/14/19 1100) Barriers/Navigation Needs: No Barriers At This Time;No Needs (01/14/19 1100)   Interventions: None Required (01/14/19 1100)           met with patient prior to receiving first chemo treatment today. All questions answered during visit. Pt made aware that will send most recent results of molecular studies to her daughter via email. Copy of results have been given to patient as well. Pt verbalized understanding. Nothing further needed at this time.           Time Spent with Patient: 30 (01/14/19 1100)

## 2019-01-15 ENCOUNTER — Telehealth: Payer: Self-pay

## 2019-01-15 ENCOUNTER — Other Ambulatory Visit: Payer: Self-pay

## 2019-01-15 ENCOUNTER — Ambulatory Visit
Admission: RE | Admit: 2019-01-15 | Discharge: 2019-01-15 | Disposition: A | Discharge status: Home / Self Care | Payer: Medicare Other | Source: Ambulatory Visit | Attending: Radiation Oncology | Admitting: Radiation Oncology

## 2019-01-15 DIAGNOSIS — C3412 Malignant neoplasm of upper lobe, left bronchus or lung: Secondary | ICD-10-CM | POA: Diagnosis not present

## 2019-01-15 NOTE — Telephone Encounter (Signed)
Telephone call to patient for follow up after receiving first chemo infusion yesterday.  Patient states she did "great".  States feeling good and eating well and drinking plenty of fluids.   Encouraged patient to call for any questions or concerns.

## 2019-01-15 NOTE — Progress Notes (Signed)
Hematology/Oncology Consult note [facility]  [phone] [phone]  Patient Care Team: [name], MD as PCP - General (Family Medicine) Telford Nab, RN as Registered Nurse   Name of the patient: Jennifer Hodges  [id number]  Sep 26, 1945   Date of visit: 01/15/19  Diagnosis- Adenocarcinoma of the lung stage III T1b N2 M0  Chief complaint/ Reason for visit-on treatment assessment prior to cycle 1 of concurrent carbotaxol chemotherapy with radiation weekly  Heme/Onc history: patient is a 73 year old female who was seen by pulmonary and ENT for ongoing symptoms of bronchitis and possible laryngitis and received antibiotics for the same. She underwent CT chest in September 2020 which showed multiple bilateral lung nodules with associated mediastinal adenopathy and possible primary left upper lobe lung mass.  This was followed by a PET CT scan which showed a left upper lobe lung mass measuring 1.9 x 1.2 cm with an SUV of 10.7.  Conglomerate AP window adenopathy measuring 5.8 x 2.1 cm with an SUV of 14.  She was also noted to have hypermetabolic left hilar adenopathy and left supraclavicular 0.7 cm lymph node with an SUV of 4.6.  Noted to have a 7 mm right lower lobe lung nodule with faint metabolic activity.  No evidence of other distant metastatic disease.  This was followed by a CT super D chest without contrast.  The right lower lobe lung nodules were somewhat larger as compared to September 2020.  Bronchoscopy of the left upper lobe lung mass and the lymph node showed non-small cell lung carcinoma favoring adenocarcinoma.  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  EGFR and Ros as well as MET testing is pending.  PD-L1 was 50%  Interval history-patient  reports chronic exertional shortness of breath and nonproductive cough which is essentially remained unchanged.  Continues to have a high-pitched voice.  ECOG PS- 1 Pain scale- 0 Opioid associated constipation- Nno  Review of systems- Review of Systems  Constitutional: Negative for chills, fever, malaise/fatigue and weight loss.  HENT: Negative for congestion, ear discharge and nosebleeds.   Eyes: Negative for blurred vision.  Respiratory: Positive for shortness of breath. Negative for cough, hemoptysis, sputum production and wheezing.   Cardiovascular: Negative for chest pain, palpitations, orthopnea and claudication.  Gastrointestinal: Negative for abdominal pain, blood in stool, constipation, diarrhea, heartburn, melena, nausea and vomiting.  Genitourinary: Negative for dysuria, flank pain, frequency, hematuria and urgency.  Musculoskeletal: Negative for back pain, joint pain and myalgias.  Skin: Negative for rash.  Neurological: Negative for dizziness, tingling, focal weakness, seizures, weakness and headaches.  Endo/Heme/Allergies: Does not bruise/bleed easily.  Psychiatric/Behavioral: Negative for depression and suicidal ideas. The patient does not have insomnia.        Allergies  Allergen Reactions   Aspirin Hives and Other (See Comments)    Difficulty breathing   Celebrex [Celecoxib] Shortness Of Breath   Morphine And Related Nausea And Vomiting and Swelling   Adhesive [Tape] Other (See Comments)    Took top layer of skin off when removed.   Clarithromycin Nausea And Vomiting   Codeine Nausea And Vomiting   Darvon [Propoxyphene Hcl] Nausea And Vomiting   Demerol [Meperidine] Nausea And Vomiting   Flonase [Fluticasone Propionate] Other (See Comments)    Fungal infection   Simvastatin Other (See Comments)    "caused ulcers in  mouth, and fever"   Talwin [Pentazocine] Nausea And Vomiting     Past Medical History:  Diagnosis Date   Anemia    Asthma    No  Inhalers--Dr. [name] will order as needed   Bronchiectasis ([place])    mild   Chronic headaches     followed by [facility] migraines   COPD (chronic obstructive pulmonary disease) (HCC)    DDD (degenerative disc disease), lumbar    Diabetes mellitus without complication [facility])    "doctor says I no longer have diabetes"     Diverticulosis    Family history of adverse reaction to anesthesia    PONV   Gall stones    history of   GERD (gastroesophageal reflux disease)    EGD 8/09- non bleeding erosive gastritis, documentd esophageal ulcerations.    Hiatal hernia    History of pneumonia    Hypercholesterolemia    IBS (irritable bowel syndrome)    Malignant neoplasm of upper lobe of left lung ([place]) 12/27/2018   Murmur    Osteoarthritis    lumbar disc disease, left hip   PONV (postoperative nausea and vomiting)    "Only with last hip and I believe it was due to the morphine"    Sleep apnea    cpap asked to bring mask and tubing   Weakness of right side of body      Past Surgical History:  Procedure Laterality Date   ABDOMINAL HYSTERECTOMY  age 89   ANTERIOR CERVICAL DECOMP/DISCECTOMY FUSION N/A 02/24/2015   Procedure: CERVICAL FOUR-FIVE, CERVICAL FIVE-SIX, CERVICAL SIX-SEVEN ANTERIOR CERVICAL DECOMPRESSION/DISCECTOMY FUSION ;  Surgeon: [name], MD;  Location: [facility];  Service: Neurosurgery;  Laterality: N/A;  C45 C56 C67 anterior cervical decompression with fusion interbody prosthesis plating and bonegraft   APPENDECTOMY     BACK SURGERY     4th lumbar fusion   BLADDER SURGERY N/A    with vaginal wall repair   BREAST CYST ASPIRATION Bilateral    neg   BREAST SURGERY Bilateral    cyst removed and reduction   CARDIAC CATHETERIZATION  2014   CARPAL TUNNEL RELEASE Right 02/11/2016   Procedure: CARPAL TUNNEL RELEASE;  Surgeon: [name], MD;  Location: [facility];  Service: Orthopedics;  Laterality: Right;   CATARACT EXTRACTION W/  INTRAOCULAR LENS IMPLANT Bilateral 2015   CHOLECYSTECTOMY     EUS N/A 05/31/2012   Procedure: UPPER ENDOSCOPIC ULTRASOUND (EUS) LINEAR;  Surgeon: [name], MD;  Location: [facility];  Service: Endoscopy;  Laterality: N/A;   EXCISIONAL HEMORRHOIDECTOMY     JOINT REPLACEMENT Bilateral    KNEE ARTHROSCOPY WITH LATERAL MENISECTOMY Right 07/07/2015   Procedure: KNEE ARTHROSCOPY WITH LATERAL MENISECTOMY, PARTIAL SYNOVECTOMY;  Surgeon: [name], MD;  Location: [facility];  Service: Orthopedics;  Laterality: Right;   LUMBAR LAMINECTOMY     PORTA CATH INSERTION N/A 01/07/2019   Procedure: PORTA CATH INSERTION;  Surgeon: [name], MD;  Location: [facility];  Service: Cardiovascular;  Laterality: N/A;   REDUCTION MAMMAPLASTY  1990   RIGHT OOPHORECTOMY     TOTAL HIP ARTHROPLASTY Left 05/01/2014   Dr. [name]   TOTAL HIP ARTHROPLASTY Right 08/05/2014   Procedure: TOTAL HIP ARTHROPLASTY ANTERIOR APPROACH;  Surgeon: [name], MD;  Location: [facility];  Service: Orthopedics;  Laterality: Right;   ULNAR NERVE TRANSPOSITION Right 02/11/2016   Procedure: ULNAR NERVE DECOMPRESSION/TRANSPOSITION;  Surgeon: [name], MD;  Location: [facility];  Service: Orthopedics;  Laterality: Right;  VIDEO BRONCHOSCOPY WITH ENDOBRONCHIAL ULTRASOUND Left 12/19/2018   Procedure: VIDEO BRONCHOSCOPY WITH ENDOBRONCHIAL ULTRASOUND, LEFT, SLEEP APNEA;  Surgeon: [name], MD;  Location: [facility];  Service: Thoracic;  Laterality: Left;    Social History   Socioeconomic History   Marital status: Married    Spouse name: Not on file   Number of children: Not on file   Years of education: Not on file   Highest education level: Not on file  Occupational History   Not on file  Social Needs   Financial resource strain: Not on file   Food insecurity    Worry: Not on file    Inability: Not on file   Transportation needs    Medical: Not on file    Non-medical: Not on  file  Tobacco Use   Smoking status: Former Smoker    Quit date: 06/13/2007    Years since quitting: 11.6   Smokeless tobacco: Never Used  Substance and Sexual Activity   Alcohol use: No    Alcohol/week: 0.0 standard drinks   Drug use: No   Sexual activity: Never  Lifestyle   Physical activity    Days per week: Not on file    Minutes per session: Not on file   Stress: Not on file  Relationships   Social connections    Talks on phone: Not on file    Gets together: Not on file    Attends religious service: Not on file    Active member of club or organization: Not on file    Attends meetings of clubs or organizations: Not on file    Relationship status: Not on file   Intimate partner violence    Fear of current or ex partner: Not on file    Emotionally abused: Not on file    Physically abused: Not on file    Forced sexual activity: Not on file  Other Topics Concern   Not on file  Social History Narrative   Not on file    Family History  Problem Relation Age of Onset   Heart disease Mother        s/p stent   Hypertension Mother    Hypercholesterolemia Mother    Diabetes Father    Stomach cancer Other        uncle   Breast cancer Cousin    Breast cancer Paternal Aunt      Current Outpatient Medications:    albuterol (PROVENTIL) (2.5 MG/3ML) 0.083% nebulizer solution, Take 2.5 mg by nebulization every 6 (six) hours as needed for wheezing., Disp: , Rfl:    albuterol (VENTOLIN HFA) 108 (90 Base) MCG/ACT inhaler, Inhale 2 puffs into the lungs every 6 (six) hours as needed for wheezing or shortness of breath., Disp: 18 g, Rfl: 0   Bioflavonoid Products (ESTER C PO), Take 1 tablet by mouth 3 (three) times daily., Disp: , Rfl:    Calcium-Magnesium-Zinc (CAL-MAG-ZINC PO), Take 1 tablet by mouth daily., Disp: , Rfl:    cetirizine (ZYRTEC) 5 MG tablet, Take 1 tablet (5 mg total) by mouth daily. (Patient taking differently: Take 5 mg by mouth daily as needed  (runny nose.). ), Disp: 30 tablet, Rfl: 0   CHLORASEPTIC MAX SORE THROAT 1.5-33 % LIQD, Use as directed 1 spray in the mouth or throat as needed (throat irritation.)., Disp: , Rfl:    Cholecalciferol (VITAMIN D3) 25 MCG (1000 UT) CHEW, Chew 5,000 Units by mouth daily., Disp: , Rfl:    Coenzyme Q10 (CO  Q 10) 100 MG CAPS, Take 100 mg by mouth daily. , Disp: , Rfl:    Cyanocobalamin (VITAMIN B-12) 2500 MCG SUBL, Place 2,500 mcg under the tongue daily., Disp: , Rfl:    cyclobenzaprine (FLEXERIL) 10 MG tablet, Take 10 mg by mouth at bedtime. , Disp: , Rfl:    dexamethasone (DECADRON) 4 MG tablet, Take 2 tablets (8 mg total) by mouth daily. Start the day after chemotherapy for 2 days., Disp: 30 tablet, Rfl: 1   DHEA 25 MG CAPS, Take 25 mg by mouth daily., Disp: , Rfl:    ezetimibe (ZETIA) 10 MG tablet, Take 10 mg by mouth at bedtime., Disp: , Rfl:    folic acid (FOLVITE) 683 MCG tablet, Take 400 mcg by mouth 2 (two) times daily. , Disp: , Rfl:    Garlic 4196 MG CAPS, Take 1,000 mg by mouth daily., Disp: , Rfl:    Histamine Dihydrochloride (AUSTRALIAN DREAM ARTHRITIS EX), Apply 1 application topically 4 (four) times daily as needed (pain.)., Disp: , Rfl:    HYDROcodone-acetaminophen (NORCO/VICODIN) 5-325 MG tablet, 1-2 tabs po bid prn (Patient taking differently: Take 1 tablet by mouth at bedtime. ), Disp: 10 tablet, Rfl: 0   HYDROcodone-homatropine (HYCODAN) 5-1.5 MG/5ML syrup, Take 5 mLs by mouth 3 (three) times daily as needed for cough., Disp: 120 mL, Rfl: 0   ipratropium (ATROVENT) 0.06 % nasal spray, Place 2 sprays into both nostrils 4 (four) times daily as needed for rhinitis., Disp: 15 mL, Rfl: 0   lidocaine-prilocaine (EMLA) cream, Apply to affected area once, Disp: 30 g, Rfl: 3   montelukast (SINGULAIR) 10 MG tablet, Take 10 mg by mouth at bedtime. , Disp: , Rfl:    pantoprazole (PROTONIX) 40 MG tablet, Take 1 tablet (40 mg total) by mouth 2 (two) times daily before a meal.,  Disp: 60 tablet, Rfl: 1   Polyethyl Glycol-Propyl Glycol (SYSTANE) 0.4-0.3 % SOLN, Place 1 drop into both eyes 5 (five) times daily as needed (dry/irritated eyes). , Disp: , Rfl:    prochlorperazine (COMPAZINE) 10 MG tablet, TAKE 1 TABLET(10 MG) BY MOUTH EVERY 6 HOURS AS NEEDED FOR NAUSEA OR VOMITING, Disp: 30 tablet, Rfl: 1   pyridOXINE (VITAMIN B-6) 100 MG tablet, Take 100 mg by mouth daily., Disp: , Rfl:    umeclidinium-vilanterol (ANORO ELLIPTA) 62.5-25 MCG/INH AEPB, Inhale into the lungs., Disp: , Rfl:    vitamin E 400 UNIT capsule, Take 400 Units by mouth daily., Disp: , Rfl:    Zoledronic Acid (RECLAST IV), Inject 1 Dose into the vein as directed. ONCE A YEAR, Disp: , Rfl:    LORazepam (ATIVAN) 0.5 MG tablet, Take 1 tablet (0.5 mg total) by mouth every 6 (six) hours as needed (Nausea or vomiting). (Patient not taking: Reported on 01/11/2019), Disp: 30 tablet, Rfl: 0   ondansetron (ZOFRAN) 8 MG tablet, Take 1 tablet (8 mg total) by mouth 2 (two) times daily as needed for refractory nausea / vomiting. Start on day 3 after chemo. (Patient not taking: Reported on 01/11/2019), Disp: 30 tablet, Rfl: 1   ondansetron (ZOFRAN-ODT) 4 MG disintegrating tablet, Take 4 mg by mouth every 8 (eight) hours as needed for nausea/vomiting., Disp: , Rfl:   Physical exam:  Vitals:   01/14/19 0851  BP: 119/67  Pulse: 83  Resp: 16  Temp: 98.2 F (36.8 C)  TempSrc: Tympanic  Weight: 168 lb 1.6 oz (76.2 kg)  Height: _0  (1.575 m)   Physical Exam Constitutional:  General: She is not in acute distress. HENT:     Head: Normocephalic and atraumatic.  Eyes:     Pupils: Pupils are equal, round, and reactive to light.  Neck:     Musculoskeletal: Normal range of motion.  Cardiovascular:     Rate and Rhythm: Normal rate and regular rhythm.     Heart sounds: Normal heart sounds.  Pulmonary:     Effort: Pulmonary effort is normal.     Breath sounds: Normal breath sounds.  Abdominal:      General: Bowel sounds are normal.     Palpations: Abdomen is soft.  Skin:    General: Skin is warm and dry.  Neurological:     Mental Status: She is alert and oriented to person, place, and time.      CMP Latest Ref Rng & Units 01/14/2019  Glucose 70 - 99 mg/dL 103(H)  BUN 8 - 23 mg/dL 12  Creatinine 0.44 - 1.00 mg/dL 0.54  Sodium 135 - 145 mmol/L 139  Potassium 3.5 - 5.1 mmol/L 4.2  Chloride 98 - 111 mmol/L 104  CO2 22 - 32 mmol/L 25  Calcium 8.9 - 10.3 mg/dL 8.8(L)  Total Protein 6.5 - 8.1 g/dL 6.9  Total Bilirubin 0.3 - 1.2 mg/dL 0.5  Alkaline Phos 38 - 126 U/L 79  AST 15 - 41 U/L 14(L)  ALT 0 - 44 U/L 17   CBC Latest Ref Rng & Units 01/14/2019  WBC 4.0 - 10.5 K/uL 12.1(H)  Hemoglobin 12.0 - 15.0 g/dL 12.8  Hematocrit 36.0 - 46.0 % 38.6  Platelets 150 - 400 K/uL 238    No images are attached to the encounter.  Mr Jeri Cos Wo Contrast  Result Date: 01/09/2019 CLINICAL DATA:  Malignant neoplasm of upper lobe of left lung. Additional history provided: Lung cancer diagnosis August 2020, migraines EXAM: MRI HEAD WITHOUT AND WITH CONTRAST TECHNIQUE: Multiplanar, multiecho pulse sequences of the brain and surrounding structures were obtained without and with intravenous contrast. CONTRAST:  7.27m GADAVIST GADOBUTROL 1 MMOL/ML IV SOLN COMPARISON:  Whole-body PET-CT 12/03/2018, brain MRI 01/14/2011 FINDINGS: Brain: There is no convincing evidence of acute infarct. No evidence of intracranial mass. No midline shift or extra-axial fluid collection. No chronic intracranial blood products. No significant white matter disease for age. Cerebral volume is normal for age. No abnormal intracranial enhancement is identified. Vascular: Flow voids maintained within the proximal large arterial vessels. Skull and upper cervical spine: No focal marrow lesion. Partially visualized cervical spinal fusion hardware. Sinuses/Orbits: Visualized orbits demonstrate no acute abnormality. Small right maxillary  sinus mucous retention cyst. No significant mastoid effusion. IMPRESSION: No evidence of acute intracranial abnormality or intracranial metastatic disease. Electronically Signed   By: KKellie SimmeringDO   On: 01/09/2019 20:10   Dg C-arm 1-60 Min-no Report  Result Date: 12/19/2018 Fluoroscopy was utilized by the requesting physician.  No radiographic interpretation.   Ct Super D Chest Wo Contrast  Result Date: 12/19/2018 CLINICAL DATA:  73year old female with history of cough and shortness of breath for the past 2-3 months. EXAM: CT CHEST WITHOUT CONTRAST TECHNIQUE: Multidetector CT imaging of the chest was performed using thin slice collimation for electromagnetic bronchoscopy planning purposes, without intravenous contrast. COMPARISON:  Chest CT 11/20/2018.  PET-CT 12/03/2018. FINDINGS: Cardiovascular: Heart size is normal. There is no significant pericardial fluid, thickening or pericardial calcification. There is aortic atherosclerosis, as well as atherosclerosis of the great vessels of the mediastinum and the coronary arteries, including calcified atherosclerotic plaque in  the left anterior descending, left circumflex and right coronary arteries. Mediastinum/Nodes: Worsening mediastinal lymphadenopathy. Specific examples include a 1.9 cm short axis AP window lymph node (axial image 20 of series 2), previously 1.8 cm. Bulky prevascular lymph nodes measuring 2.0 x 5.9 cm (axial image 18 of series 2), previously 1.9 x 5.0 cm. Borderline enlarged low right paratracheal lymph node measuring 1.2 cm in short axis (previously only 7 mm). Esophagus is unremarkable in appearance. No axillary lymphadenopathy Lungs/Pleura: Multiple pulmonary nodules are noted in the lungs bilaterally, most of which appear increased in size compared to the prior examination. The largest of these is in the medial aspect of the left upper lobe (axial image 20 of series 2 and coronal image 32 of series 6) measuring 2.1 x 1.7 x 2.3 cm,  previously 2.0 x 1.7 x 1.8 cm. Clustered pulmonary nodules in the right lower lobe generally appear increased in size compared to the prior examination, best demonstrated by 9 mm right lower lobe nodule (axial image 40 of series 3), which previously measured 8 mm, and 8 mm right lower lobe nodule (axial image 45 of series 3), previously 6 mm). No acute consolidative airspace disease. No pleural effusions. Upper Abdomen: Aortic atherosclerosis. Musculoskeletal: Orthopedic fixation hardware in the lower cervical spine incidentally noted. There are no aggressive appearing lytic or blastic lesions noted in the visualized portions of the skeleton. IMPRESSION: 1. Progression of disease as evidenced by enlargement of previously noted pulmonary nodules in the lungs bilaterally, as well as progressive mediastinal lymphadenopathy, as detailed above. 2. Aortic atherosclerosis, in addition to two vessel coronary artery disease. Assessment for potential risk factor modification, dietary therapy or pharmacologic therapy may be warranted, if clinically indicated. Aortic Atherosclerosis (ICD10-I70.0). Electronically Signed   By: Vinnie Langton M.D.   On: 12/19/2018 10:18     Assessment and plan- Patient is a 73 y.o. female with stage III adenocarcinoma of the lung T1b N2 M0 here for on treatment assessment prior to cycle 1 of weekly carbotaxol chemotherapy concurrent with radiation  Counts okay to proceed with cycle 1 of weekly carbotaxol chemotherapy today.  Discussed with the patient that she will be getting carboplatin and Taxol on a weekly basis for 7 doses with 7 weeks of radiation.  Discussed risks and benefits of chemotherapy including all but not limited to nausea, vomiting, low blood counts, risk of infections and hospitalization.  Risk of peripheral neuropathy and hair loss associated with Taxol as well as infusion reaction.  Treatment is being given with a curative intent.    Also discussed the results of MRI  brain did not show any evidence of metastatic disease.  I will see her back in 1 week's time with CBC with differential, CMP for cycle 2 and I will be seeing her every alternate cycle following that.   Visit Diagnosis 1. Encounter for antineoplastic chemotherapy   2. Malignant neoplasm of upper lobe of left lung ([place])      Dr. Randa Evens, MD, MPH [facility] [phone] 01/15/2019 8:16 AM

## 2019-01-16 ENCOUNTER — Ambulatory Visit
Admission: RE | Admit: 2019-01-16 | Discharge: 2019-01-16 | Disposition: A | Discharge status: Home / Self Care | Payer: Medicare Other | Source: Ambulatory Visit | Attending: Radiation Oncology | Admitting: Radiation Oncology

## 2019-01-16 ENCOUNTER — Other Ambulatory Visit: Payer: Self-pay

## 2019-01-16 DIAGNOSIS — C3412 Malignant neoplasm of upper lobe, left bronchus or lung: Secondary | ICD-10-CM | POA: Diagnosis not present

## 2019-01-17 ENCOUNTER — Other Ambulatory Visit: Payer: Self-pay

## 2019-01-17 ENCOUNTER — Ambulatory Visit
Admission: RE | Admit: 2019-01-17 | Discharge: 2019-01-17 | Disposition: A | Discharge status: Home / Self Care | Payer: Medicare Other | Source: Ambulatory Visit | Attending: Radiation Oncology | Admitting: Radiation Oncology

## 2019-01-17 DIAGNOSIS — C3412 Malignant neoplasm of upper lobe, left bronchus or lung: Secondary | ICD-10-CM | POA: Diagnosis not present

## 2019-01-18 ENCOUNTER — Ambulatory Visit
Admission: RE | Admit: 2019-01-18 | Discharge: 2019-01-18 | Disposition: A | Discharge status: Home / Self Care | Payer: Medicare Other | Source: Ambulatory Visit | Attending: Radiation Oncology | Admitting: Radiation Oncology

## 2019-01-18 ENCOUNTER — Other Ambulatory Visit: Payer: Self-pay

## 2019-01-18 DIAGNOSIS — C3412 Malignant neoplasm of upper lobe, left bronchus or lung: Secondary | ICD-10-CM | POA: Diagnosis not present

## 2019-01-18 NOTE — Progress Notes (Signed)
Patient pre screened for office appointment, no questions or concerns today. Reminded patient of appointment date and time.

## 2019-01-21 ENCOUNTER — Ambulatory Visit
Admission: RE | Admit: 2019-01-21 | Discharge: 2019-01-21 | Disposition: A | Discharge status: Home / Self Care | Payer: Medicare Other | Source: Ambulatory Visit | Attending: Radiation Oncology | Admitting: Radiation Oncology

## 2019-01-21 ENCOUNTER — Ambulatory Visit
Admission: RE | Admit: 2019-01-21 | Discharge: 2019-01-21 | Disposition: A | Discharge status: Home / Self Care | Payer: Medicare Other | Source: Ambulatory Visit | Attending: Oncology | Admitting: Oncology

## 2019-01-21 ENCOUNTER — Encounter: Payer: Self-pay | Admitting: *Deleted

## 2019-01-21 ENCOUNTER — Inpatient Hospital Stay: Payer: Medicare Other

## 2019-01-21 ENCOUNTER — Other Ambulatory Visit: Payer: Self-pay

## 2019-01-21 ENCOUNTER — Telehealth: Payer: Self-pay | Admitting: *Deleted

## 2019-01-21 ENCOUNTER — Inpatient Hospital Stay (HOSPITAL_BASED_OUTPATIENT_CLINIC_OR_DEPARTMENT_OTHER): Payer: Medicare Other | Admitting: Oncology

## 2019-01-21 VITALS — BP 136/56 | HR 89 | Temp 97.8°F

## 2019-01-21 DIAGNOSIS — R109 Unspecified abdominal pain: Secondary | ICD-10-CM | POA: Insufficient documentation

## 2019-01-21 DIAGNOSIS — C3412 Malignant neoplasm of upper lobe, left bronchus or lung: Secondary | ICD-10-CM

## 2019-01-21 DIAGNOSIS — Z5111 Encounter for antineoplastic chemotherapy: Secondary | ICD-10-CM

## 2019-01-21 LAB — CBC WITH DIFFERENTIAL/PLATELET
Abs Immature Granulocytes: 0.04 10*3/uL (ref 0.00–0.07)
Basophils Absolute: 0 10*3/uL (ref 0.0–0.1)
Basophils Relative: 1 %
Eosinophils Absolute: 1 10*3/uL — ABNORMAL HIGH (ref 0.0–0.5)
Eosinophils Relative: 11 %
HCT: 36.2 % (ref 36.0–46.0)
Hemoglobin: 11.9 g/dL — ABNORMAL LOW (ref 12.0–15.0)
Immature Granulocytes: 1 %
Lymphocytes Relative: 13 %
Lymphs Abs: 1.1 10*3/uL (ref 0.7–4.0)
MCH: 29.8 pg (ref 26.0–34.0)
MCHC: 32.9 g/dL (ref 30.0–36.0)
MCV: 90.7 fL (ref 80.0–100.0)
Monocytes Absolute: 0.4 10*3/uL (ref 0.1–1.0)
Monocytes Relative: 5 %
Neutro Abs: 6 10*3/uL (ref 1.7–7.7)
Neutrophils Relative %: 69 %
Platelets: 288 10*3/uL (ref 150–400)
RBC: 3.99 MIL/uL (ref 3.87–5.11)
RDW: 12.2 % (ref 11.5–15.5)
WBC: 8.5 10*3/uL (ref 4.0–10.5)
nRBC: 0 % (ref 0.0–0.2)

## 2019-01-21 LAB — COMPREHENSIVE METABOLIC PANEL
ALT: 15 U/L (ref 0–44)
AST: 13 U/L — ABNORMAL LOW (ref 15–41)
Albumin: 3.7 g/dL (ref 3.5–5.0)
Alkaline Phosphatase: 65 U/L (ref 38–126)
Anion gap: 7 (ref 5–15)
BUN: 11 mg/dL (ref 8–23)
CO2: 25 mmol/L (ref 22–32)
Calcium: 8.7 mg/dL — ABNORMAL LOW (ref 8.9–10.3)
Chloride: 104 mmol/L (ref 98–111)
Creatinine, Ser: 0.53 mg/dL (ref 0.44–1.00)
GFR calc Af Amer: >60 mL/min (ref >60)
GFR calc non Af Amer: >60 mL/min (ref >60)
Glucose, Bld: 107 mg/dL — ABNORMAL HIGH (ref 70–99)
Potassium: 4.1 mmol/L (ref 3.5–5.1)
Sodium: 136 mmol/L (ref 135–145)
Total Bilirubin: 0.3 mg/dL (ref 0.3–1.2)
Total Protein: 6.7 g/dL (ref 6.5–8.1)

## 2019-01-21 MED ORDER — SODIUM CHLORIDE 0.9 % IV SOLN
171.2000 mg | Freq: Once | INTRAVENOUS | Status: AC
  Administered 2019-01-21: 170 mg via INTRAVENOUS
  Filled 2019-01-21: qty 17

## 2019-01-21 MED ORDER — SODIUM CHLORIDE 0.9 % IV SOLN
45.0000 mg/m2 | Freq: Once | INTRAVENOUS | Status: AC
  Administered 2019-01-21: 84 mg via INTRAVENOUS
  Filled 2019-01-21: qty 14

## 2019-01-21 MED ORDER — HEPARIN SOD (PORK) LOCK FLUSH 100 UNIT/ML IV SOLN
500.0000 [IU] | Freq: Once | INTRAVENOUS | Status: AC
  Administered 2019-01-21: 500 [IU] via INTRAVENOUS
  Filled 2019-01-21: qty 5

## 2019-01-21 MED ORDER — PALONOSETRON HCL INJECTION 0.25 MG/5ML
0.2500 mg | Freq: Once | INTRAVENOUS | Status: AC
  Administered 2019-01-21: 0.25 mg via INTRAVENOUS
  Filled 2019-01-21: qty 5

## 2019-01-21 MED ORDER — SODIUM CHLORIDE 0.9 % IV SOLN
Freq: Once | INTRAVENOUS | Status: AC
  Administered 2019-01-21: 11:00:00 via INTRAVENOUS
  Filled 2019-01-21: qty 250

## 2019-01-21 MED ORDER — SODIUM CHLORIDE 0.9% FLUSH
10.0000 mL | INTRAVENOUS | Status: DC | PRN
  Administered 2019-01-21: 10 mL via INTRAVENOUS
  Filled 2019-01-21: qty 10

## 2019-01-21 MED ORDER — IOHEXOL 300 MG/ML  SOLN
100.0000 mL | Freq: Once | INTRAMUSCULAR | Status: AC | PRN
  Administered 2019-01-21: 100 mL via INTRAVENOUS

## 2019-01-21 MED ORDER — SODIUM CHLORIDE 0.9 % IV SOLN
20.0000 mg | Freq: Once | INTRAVENOUS | Status: AC
  Administered 2019-01-21: 20 mg via INTRAVENOUS
  Filled 2019-01-21: qty 2

## 2019-01-21 MED ORDER — DIPHENHYDRAMINE HCL 50 MG/ML IJ SOLN
50.0000 mg | Freq: Once | INTRAMUSCULAR | Status: AC
  Administered 2019-01-21: 50 mg via INTRAVENOUS
  Filled 2019-01-21: qty 1

## 2019-01-21 MED ORDER — FAMOTIDINE IN NACL 20-0.9 MG/50ML-% IV SOLN
20.0000 mg | Freq: Once | INTRAVENOUS | Status: AC
  Administered 2019-01-21: 20 mg via INTRAVENOUS
  Filled 2019-01-21: qty 50

## 2019-01-21 NOTE — Progress Notes (Signed)
Port left accessed for CT use. Pt educated to report back to clinic to have port deaccessed after CT scan. Pt verbalizes understanding. Pt stable at discharge.

## 2019-01-21 NOTE — Progress Notes (Signed)
  Oncology Nurse Navigator Documentation  Navigator Location: [facility] (01/21/19 1300)   )Navigator Encounter Type: Treatment (01/21/19 1300)                     Patient Visit Type: MedOnc (01/21/19 1300) Treatment Phase: Treatment (01/21/19 1300) Barriers/Navigation Needs: Coordination of Care (01/21/19 1300)   Interventions: Coordination of Care;Referrals (01/21/19 1300) Referrals: (GI) (01/21/19 1300) Coordination of Care: Appts;Radiology (01/21/19 1300)         met with patient during follow up visit with Dr. Janese Banks. All questions answered during visit. Reviewed upcoming appts for STAT abdominal CT today and follow up visit with Stephens November at [facility] tomorrow at 3:30pm. Instructed pt to call with any further questions or needs. Pt verbalized understanding. Nothing further needed at this time.         Time Spent with Patient: 60 (01/21/19 1300)

## 2019-01-21 NOTE — Telephone Encounter (Signed)
Pt was contacted and let her know that the ct scan did not show any diverticulitis so therefore she does need atb. She states that the atb she was talking about is for when she has dental procedures. Dr. Nicola Girt gave her a rx

## 2019-01-22 ENCOUNTER — Encounter: Payer: Self-pay | Admitting: Oncology

## 2019-01-22 ENCOUNTER — Other Ambulatory Visit: Payer: Self-pay

## 2019-01-22 ENCOUNTER — Ambulatory Visit
Admission: RE | Admit: 2019-01-22 | Discharge: 2019-01-22 | Disposition: A | Discharge status: Home / Self Care | Payer: Medicare Other | Source: Ambulatory Visit | Attending: Radiation Oncology | Admitting: Radiation Oncology

## 2019-01-22 DIAGNOSIS — C3412 Malignant neoplasm of upper lobe, left bronchus or lung: Secondary | ICD-10-CM | POA: Diagnosis not present

## 2019-01-22 NOTE — Progress Notes (Signed)
Hematology/Oncology Consult note [facility]  [phone] [phone]  Patient Care Team: [name], MD as PCP - General (Family Medicine) Telford Nab, RN as Registered Nurse   Name of the patient: Jennifer Hodges  [id number]  May 13, 1945   Date of visit: 01/22/19  Diagnosis- Adenocarcinoma of the lung stage III T1b N2 M0  Chief complaint/ Reason for visit-on treatment assessment prior to cycle 2 of weekly carbotaxol chemotherapy concurrent with radiation  Heme/Onc history:  patient is a 73 year old female who was seen by pulmonary and ENT for ongoing symptoms of bronchitis and possible laryngitis and received antibiotics for the same.She underwent CT chest in September 2020 which showed multiple bilateral lung nodules with associated mediastinal adenopathy and possible primary left upper lobe lung mass. This was followed by a PET CT scan which showed a left upper lobe lung mass measuring 1.9 x 1.2 cm with an SUV of 10.7. Conglomerate AP window adenopathy measuring 5.8 x 2.1 cm with an SUV of 14. She was also noted to have hypermetabolic left hilar adenopathy and left supraclavicular 0.7 cm lymph node with an SUV of 4.6. Noted to have a 7 mm right lower lobe lung nodule with faint metabolic activity. No evidence of other distant metastatic disease. This was followed by a CT super D chest without contrast. The right lower lobe lung nodules were somewhat larger as compared to September 2020. Bronchoscopy of the left upper lobe lung mass and the lymph node showed non-small cell lung carcinoma favoring adenocarcinoma.  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  EGFR and Ros as well as MET testing is pending.  PD-L1 was 50%  Interval history-patient  reports having cramping abdominal pain and bloody bowel movements over the last 2 days.  Denies any fever.  ECOG PS- 1 Pain scale- 0 Opioid associated constipation- no  Review of systems- Review of Systems  Constitutional: Positive for malaise/fatigue. Negative for chills, fever and weight loss.  HENT: Negative for congestion, ear discharge and nosebleeds.   Eyes: Negative for blurred vision.  Respiratory: Negative for cough, hemoptysis, sputum production, shortness of breath and wheezing.   Cardiovascular: Negative for chest pain, palpitations, orthopnea and claudication.  Gastrointestinal: Positive for abdominal pain and blood in stool. Negative for constipation, heartburn, melena, nausea and vomiting.  Genitourinary: Negative for dysuria, flank pain, frequency, hematuria and urgency.  Musculoskeletal: Negative for back pain, joint pain and myalgias.  Skin: Negative for rash.  Neurological: Negative for dizziness, tingling, focal weakness, seizures, weakness and headaches.  Endo/Heme/Allergies: Does not bruise/bleed easily.  Psychiatric/Behavioral: Negative for depression and suicidal ideas. The patient does not have insomnia.       Allergies  Allergen Reactions   Aspirin Hives and Other (See Comments)    Difficulty breathing   Celebrex [Celecoxib] Shortness Of Breath   Morphine And Related Nausea And Vomiting and Swelling   Adhesive [Tape] Other (See Comments)    Took top layer of skin off when removed.   Clarithromycin Nausea And Vomiting   Codeine Nausea And Vomiting   Darvon [Propoxyphene Hcl] Nausea And Vomiting   Demerol [Meperidine] Nausea And Vomiting   Flonase [Fluticasone Propionate] Other (See Comments)    Fungal infection   Simvastatin Other (See Comments)    "caused ulcers in mouth, and fever"   Talwin [Pentazocine] Nausea And Vomiting  Past Medical History:  Diagnosis Date   Anemia    Asthma    No Inhalers--Dr. [name] will order as needed    Bronchiectasis ([place])    mild   Chronic headaches     followed by [facility] migraines   COPD (chronic obstructive pulmonary disease) (HCC)    DDD (degenerative disc disease), lumbar    Diabetes mellitus without complication [facility])    "doctor says I no longer have diabetes"     Diverticulosis    Family history of adverse reaction to anesthesia    PONV   Gall stones    history of   GERD (gastroesophageal reflux disease)    EGD 8/09- non bleeding erosive gastritis, documentd esophageal ulcerations.    Hiatal hernia    History of pneumonia    Hypercholesterolemia    IBS (irritable bowel syndrome)    Malignant neoplasm of upper lobe of left lung ([place]) 12/27/2018   Murmur    Osteoarthritis    lumbar disc disease, left hip   PONV (postoperative nausea and vomiting)    "Only with last hip and I believe it was due to the morphine"    Sleep apnea    cpap asked to bring mask and tubing   Weakness of right side of body      Past Surgical History:  Procedure Laterality Date   ABDOMINAL HYSTERECTOMY  age 34   ANTERIOR CERVICAL DECOMP/DISCECTOMY FUSION N/A 02/24/2015   Procedure: CERVICAL FOUR-FIVE, CERVICAL FIVE-SIX, CERVICAL SIX-SEVEN ANTERIOR CERVICAL DECOMPRESSION/DISCECTOMY FUSION ;  Surgeon: [name], MD;  Location: [place] [facility];  Service: Neurosurgery;  Laterality: N/A;  C45 C56 C67 anterior cervical decompression with fusion interbody prosthesis plating and bonegraft   APPENDECTOMY     BACK SURGERY     4th lumbar fusion   BLADDER SURGERY N/A    with vaginal wall repair   BREAST CYST ASPIRATION Bilateral    neg   BREAST SURGERY Bilateral    cyst removed and reduction   CARDIAC CATHETERIZATION  2014   CARPAL TUNNEL RELEASE Right 02/11/2016   Procedure: CARPAL TUNNEL RELEASE;  Surgeon: [name], MD;  Location: [facility];  Service: Orthopedics;  Laterality: Right;   CATARACT EXTRACTION W/ INTRAOCULAR LENS IMPLANT Bilateral 2015    CHOLECYSTECTOMY     EUS N/A 05/31/2012   Procedure: UPPER ENDOSCOPIC ULTRASOUND (EUS) LINEAR;  Surgeon: [name], MD;  Location: [facility];  Service: Endoscopy;  Laterality: N/A;   EXCISIONAL HEMORRHOIDECTOMY     JOINT REPLACEMENT Bilateral    KNEE ARTHROSCOPY WITH LATERAL MENISECTOMY Right 07/07/2015   Procedure: KNEE ARTHROSCOPY WITH LATERAL MENISECTOMY, PARTIAL SYNOVECTOMY;  Surgeon: [name], MD;  Location: [facility];  Service: Orthopedics;  Laterality: Right;   LUMBAR LAMINECTOMY     PORTA CATH INSERTION N/A 01/07/2019   Procedure: PORTA CATH INSERTION;  Surgeon: [name], MD;  Location: [facility];  Service: Cardiovascular;  Laterality: N/A;   REDUCTION MAMMAPLASTY  1990   RIGHT OOPHORECTOMY     TOTAL HIP ARTHROPLASTY Left 05/01/2014   Dr. [name]   TOTAL HIP ARTHROPLASTY Right 08/05/2014   Procedure: TOTAL HIP ARTHROPLASTY ANTERIOR APPROACH;  Surgeon: [name], MD;  Location: [facility];  Service: Orthopedics;  Laterality: Right;   ULNAR NERVE TRANSPOSITION Right 02/11/2016   Procedure: ULNAR NERVE DECOMPRESSION/TRANSPOSITION;  Surgeon: [name], MD;  Location: [facility];  Service: Orthopedics;  Laterality: Right;   VIDEO BRONCHOSCOPY WITH ENDOBRONCHIAL ULTRASOUND Left 12/19/2018   Procedure: VIDEO BRONCHOSCOPY  WITH ENDOBRONCHIAL ULTRASOUND, LEFT, SLEEP APNEA;  Surgeon: [name], MD;  Location: [facility];  Service: Thoracic;  Laterality: Left;    Social History   Socioeconomic History   Marital status: Married    Spouse name: Not on file   Number of children: Not on file   Years of education: Not on file   Highest education level: Not on file  Occupational History   Not on file  Social Needs   Financial resource strain: Not on file   Food insecurity    Worry: Not on file    Inability: Not on file   Transportation needs    Medical: Not on file    Non-medical: Not on file  Tobacco Use   Smoking status: Former  Smoker    Quit date: 06/13/2007    Years since quitting: 11.6   Smokeless tobacco: Never Used  Substance and Sexual Activity   Alcohol use: No    Alcohol/week: 0.0 standard drinks   Drug use: No   Sexual activity: Never  Lifestyle   Physical activity    Days per week: Not on file    Minutes per session: Not on file   Stress: Not on file  Relationships   Social connections    Talks on phone: Not on file    Gets together: Not on file    Attends religious service: Not on file    Active member of club or organization: Not on file    Attends meetings of clubs or organizations: Not on file    Relationship status: Not on file   Intimate partner violence    Fear of current or ex partner: Not on file    Emotionally abused: Not on file    Physically abused: Not on file    Forced sexual activity: Not on file  Other Topics Concern   Not on file  Social History Narrative   Not on file    Family History  Problem Relation Age of Onset   Heart disease Mother        s/p stent   Hypertension Mother    Hypercholesterolemia Mother    Diabetes Father    Stomach cancer Other        uncle   Breast cancer Cousin    Breast cancer Paternal Aunt      Current Outpatient Medications:    albuterol (PROVENTIL) (2.5 MG/3ML) 0.083% nebulizer solution, Take 2.5 mg by nebulization every 6 (six) hours as needed for wheezing., Disp: , Rfl:    albuterol (VENTOLIN HFA) 108 (90 Base) MCG/ACT inhaler, Inhale 2 puffs into the lungs every 6 (six) hours as needed for wheezing or shortness of breath., Disp: 18 g, Rfl: 0   Bioflavonoid Products (ESTER C PO), Take 1 tablet by mouth 3 (three) times daily., Disp: , Rfl:    Calcium-Magnesium-Zinc (CAL-MAG-ZINC PO), Take 1 tablet by mouth daily., Disp: , Rfl:    cetirizine (ZYRTEC) 5 MG tablet, Take 1 tablet (5 mg total) by mouth daily. (Patient taking differently: Take 5 mg by mouth daily as needed (runny nose.). ), Disp: 30 tablet, Rfl: 0    CHLORASEPTIC MAX SORE THROAT 1.5-33 % LIQD, Use as directed 1 spray in the mouth or throat as needed (throat irritation.)., Disp: , Rfl:    Cholecalciferol (VITAMIN D3) 25 MCG (1000 UT) CHEW, Chew 5,000 Units by mouth daily., Disp: , Rfl:    Coenzyme Q10 (CO Q 10) 100 MG CAPS, Take 100 mg by mouth daily. ,  Disp: , Rfl:    Cyanocobalamin (VITAMIN B-12) 2500 MCG SUBL, Place 2,500 mcg under the tongue daily., Disp: , Rfl:    cyclobenzaprine (FLEXERIL) 10 MG tablet, Take 10 mg by mouth at bedtime. , Disp: , Rfl:    dexamethasone (DECADRON) 4 MG tablet, Take 2 tablets (8 mg total) by mouth daily. Start the day after chemotherapy for 2 days., Disp: 30 tablet, Rfl: 1   DHEA 25 MG CAPS, Take 25 mg by mouth daily., Disp: , Rfl:    ezetimibe (ZETIA) 10 MG tablet, Take 10 mg by mouth at bedtime., Disp: , Rfl:    folic acid (FOLVITE) 767 MCG tablet, Take 400 mcg by mouth 2 (two) times daily. , Disp: , Rfl:    Garlic 3419 MG CAPS, Take 1,000 mg by mouth daily., Disp: , Rfl:    Histamine Dihydrochloride (AUSTRALIAN DREAM ARTHRITIS EX), Apply 1 application topically 4 (four) times daily as needed (pain.)., Disp: , Rfl:    HYDROcodone-acetaminophen (NORCO/VICODIN) 5-325 MG tablet, 1-2 tabs po bid prn (Patient taking differently: Take 1 tablet by mouth at bedtime. ), Disp: 10 tablet, Rfl: 0   HYDROcodone-homatropine (HYCODAN) 5-1.5 MG/5ML syrup, Take 5 mLs by mouth 3 (three) times daily as needed for cough., Disp: 120 mL, Rfl: 0   ipratropium (ATROVENT) 0.06 % nasal spray, Place 2 sprays into both nostrils 4 (four) times daily as needed for rhinitis., Disp: 15 mL, Rfl: 0   lidocaine-prilocaine (EMLA) cream, Apply to affected area once, Disp: 30 g, Rfl: 3   LORazepam (ATIVAN) 0.5 MG tablet, Take 1 tablet (0.5 mg total) by mouth every 6 (six) hours as needed (Nausea or vomiting)., Disp: 30 tablet, Rfl: 0   montelukast (SINGULAIR) 10 MG tablet, Take 10 mg by mouth at bedtime. , Disp: , Rfl:     ondansetron (ZOFRAN) 8 MG tablet, Take 1 tablet (8 mg total) by mouth 2 (two) times daily as needed for refractory nausea / vomiting. Start on day 3 after chemo., Disp: 30 tablet, Rfl: 1   ondansetron (ZOFRAN-ODT) 4 MG disintegrating tablet, Take 4 mg by mouth every 8 (eight) hours as needed for nausea/vomiting., Disp: , Rfl:    pantoprazole (PROTONIX) 40 MG tablet, Take 1 tablet (40 mg total) by mouth 2 (two) times daily before a meal., Disp: 60 tablet, Rfl: 1   Polyethyl Glycol-Propyl Glycol (SYSTANE) 0.4-0.3 % SOLN, Place 1 drop into both eyes 5 (five) times daily as needed (dry/irritated eyes). , Disp: , Rfl:    prochlorperazine (COMPAZINE) 10 MG tablet, TAKE 1 TABLET(10 MG) BY MOUTH EVERY 6 HOURS AS NEEDED FOR NAUSEA OR VOMITING, Disp: 30 tablet, Rfl: 1   pyridOXINE (VITAMIN B-6) 100 MG tablet, Take 100 mg by mouth daily., Disp: , Rfl:    umeclidinium-vilanterol (ANORO ELLIPTA) 62.5-25 MCG/INH AEPB, Inhale into the lungs., Disp: , Rfl:    vitamin E 400 UNIT capsule, Take 400 Units by mouth daily., Disp: , Rfl:    Zoledronic Acid (RECLAST IV), Inject 1 Dose into the vein as directed. ONCE A YEAR, Disp: , Rfl:   Physical exam:  Vitals:   01/21/19 0949  BP: (!) 136/56  Pulse: 89  Temp: 97.8 F (36.6 C)  TempSrc: Tympanic   Physical Exam Constitutional:      General: She is not in acute distress. HENT:     Head: Normocephalic and atraumatic.  Eyes:     Pupils: Pupils are equal, round, and reactive to light.  Neck:     Musculoskeletal: Normal  range of motion.  Cardiovascular:     Rate and Rhythm: Normal rate and regular rhythm.     Heart sounds: Normal heart sounds.  Pulmonary:     Effort: Pulmonary effort is normal.     Breath sounds: Normal breath sounds.  Abdominal:     General: Bowel sounds are normal.     Palpations: Abdomen is soft.     Comments: TTP epigastrium and LLQ  Skin:    General: Skin is warm and dry.  Neurological:     Mental Status: She is alert and  oriented to person, place, and time.      CMP Latest Ref Rng & Units 01/21/2019  Glucose 70 - 99 mg/dL 107(H)  BUN 8 - 23 mg/dL 11  Creatinine 0.44 - 1.00 mg/dL 0.53  Sodium 135 - 145 mmol/L 136  Potassium 3.5 - 5.1 mmol/L 4.1  Chloride 98 - 111 mmol/L 104  CO2 22 - 32 mmol/L 25  Calcium 8.9 - 10.3 mg/dL 8.7(L)  Total Protein 6.5 - 8.1 g/dL 6.7  Total Bilirubin 0.3 - 1.2 mg/dL 0.3  Alkaline Phos 38 - 126 U/L 65  AST 15 - 41 U/L 13(L)  ALT 0 - 44 U/L 15   CBC Latest Ref Rng & Units 01/21/2019  WBC 4.0 - 10.5 K/uL 8.5  Hemoglobin 12.0 - 15.0 g/dL 11.9(L)  Hematocrit 36.0 - 46.0 % 36.2  Platelets 150 - 400 K/uL 288    No images are attached to the encounter.  Mr Jeri Cos Wo Contrast  Result Date: 01/09/2019 CLINICAL DATA:  Malignant neoplasm of upper lobe of left lung. Additional history provided: Lung cancer diagnosis August 2020, migraines EXAM: MRI HEAD WITHOUT AND WITH CONTRAST TECHNIQUE: Multiplanar, multiecho pulse sequences of the brain and surrounding structures were obtained without and with intravenous contrast. CONTRAST:  7.70m GADAVIST GADOBUTROL 1 MMOL/ML IV SOLN COMPARISON:  Whole-body PET-CT 12/03/2018, brain MRI 01/14/2011 FINDINGS: Brain: There is no convincing evidence of acute infarct. No evidence of intracranial mass. No midline shift or extra-axial fluid collection. No chronic intracranial blood products. No significant white matter disease for age. Cerebral volume is normal for age. No abnormal intracranial enhancement is identified. Vascular: Flow voids maintained within the proximal large arterial vessels. Skull and upper cervical spine: No focal marrow lesion. Partially visualized cervical spinal fusion hardware. Sinuses/Orbits: Visualized orbits demonstrate no acute abnormality. Small right maxillary sinus mucous retention cyst. No significant mastoid effusion. IMPRESSION: No evidence of acute intracranial abnormality or intracranial metastatic disease.  Electronically Signed   By: KKellie SimmeringDO   On: 01/09/2019 20:10   Ct Abdomen Pelvis W Contrast  Result Date: 01/21/2019 CLINICAL DATA:  Abdominal pain, lung cancer, on chemotherapy EXAM: CT ABDOMEN AND PELVIS WITH CONTRAST TECHNIQUE: Multidetector CT imaging of the abdomen and pelvis was performed using the standard protocol following bolus administration of intravenous contrast. CONTRAST:  1036mOMNIPAQUE IOHEXOL 300 MG/ML  SOLN COMPARISON:  PET-CT dated 12/03/2018 FINDINGS: Lower chest: Pulmonary nodules/metastases in the bilateral lower lobes, measuring up to 8 mm, favored to be mildly progressive. Hepatobiliary: Liver is within normal limits. Status post cholecystectomy. No intrahepatic or extrahepatic ductal dilatation. Pancreas: Within normal limits. Spleen: Within normal limits. Adrenals/Urinary Tract: Adrenal glands are within normal limits. Subcentimeter bilateral renal cysts. Kidneys are otherwise within normal limits. No hydronephrosis. Bladder is within normal limits. Stomach/Bowel: Stomach is within normal limits. No evidence of bowel obstruction. Appendix is not discretely visualized. Mild cecal wall thickening (series 2/image 49), equivocal but possibly  reflecting infectious/inflammatory colitis. Lower sigmoid colonic diverticulosis, without evidence of diverticulitis. Vascular/Lymphatic: No evidence of abdominal aortic aneurysm. Atherosclerotic calcifications of the abdominal aorta and branch vessels. No suspicious abdominopelvic lymphadenopathy. Reproductive: Status post hysterectomy. No adnexal masses. Other: No abdominopelvic ascites. Musculoskeletal: Mild degenerative changes at L1-2 and L4-5. Bilateral hip arthroplasties, without evidence of complication. IMPRESSION: Mild cecal wall thickening, possibly reflecting infectious/inflammatory colitis. No evidence of bowel obstruction. Appendix not discretely visualized. Sigmoid diverticulosis, without evidence of diverticulitis. Pulmonary  nodules/metastases at the lung bases, measuring up to 8 mm, favored to be mildly progressive. Electronically Signed   By: Julian Hy M.D.   On: 01/21/2019 15:07     Assessment and plan- Patient is a 73 y.o. female with stage III adenocarcinoma of the lung T1b N2 M0.  She is here for on treatment assessment prior to cycle 2 of weekly carbotaxol chemotherapy concurrent with radiation  Counts okay to proceed with cycle 2 of weekly carbotaxol chemotherapy today.  She will directly proceed for cycle 3 next week and I will see her back in 2 weeks for cycle 4  Given her symptoms of cramping abdominal pain and occasional blood in her stools I did obtain a CT abdomen pelvis with contrast which did not show any features of acute diverticulitis.  I will therefore hold off on giving her any antibiotics at this time.  CT did show possible cecal inflammation/colitis and I will refer her to [facility] GI for the same.   Patient will be seeing dentist tomorrow for her symptoms of tooth pain and concerns with her crown   Visit Diagnosis 1. Abdominal pain, unspecified abdominal location   2. Encounter for antineoplastic chemotherapy   3. Malignant neoplasm of upper lobe of left lung ([place])      Dr. Randa Evens, MD, MPH [facility] [phone] 01/22/2019 9:05 AM

## 2019-01-23 ENCOUNTER — Other Ambulatory Visit: Payer: Self-pay

## 2019-01-23 ENCOUNTER — Ambulatory Visit
Admission: RE | Admit: 2019-01-23 | Discharge: 2019-01-23 | Disposition: A | Discharge status: Home / Self Care | Payer: Medicare Other | Source: Ambulatory Visit | Attending: Radiation Oncology | Admitting: Radiation Oncology

## 2019-01-23 DIAGNOSIS — C3412 Malignant neoplasm of upper lobe, left bronchus or lung: Secondary | ICD-10-CM | POA: Diagnosis not present

## 2019-01-24 ENCOUNTER — Ambulatory Visit: Payer: Medicare Other | Admitting: Oncology

## 2019-01-24 ENCOUNTER — Other Ambulatory Visit: Payer: Medicare Other

## 2019-01-24 ENCOUNTER — Other Ambulatory Visit: Payer: Self-pay

## 2019-01-24 ENCOUNTER — Ambulatory Visit
Admission: RE | Admit: 2019-01-24 | Discharge: 2019-01-24 | Disposition: A | Discharge status: Home / Self Care | Payer: Medicare Other | Source: Ambulatory Visit | Attending: Radiation Oncology | Admitting: Radiation Oncology

## 2019-01-24 ENCOUNTER — Ambulatory Visit: Payer: Medicare Other

## 2019-01-24 DIAGNOSIS — C3412 Malignant neoplasm of upper lobe, left bronchus or lung: Secondary | ICD-10-CM | POA: Diagnosis not present

## 2019-01-25 ENCOUNTER — Other Ambulatory Visit: Payer: Self-pay

## 2019-01-25 ENCOUNTER — Ambulatory Visit
Admission: RE | Admit: 2019-01-25 | Discharge: 2019-01-25 | Disposition: A | Discharge status: Home / Self Care | Payer: Medicare Other | Source: Ambulatory Visit | Attending: Radiation Oncology | Admitting: Radiation Oncology

## 2019-01-25 DIAGNOSIS — C3412 Malignant neoplasm of upper lobe, left bronchus or lung: Secondary | ICD-10-CM | POA: Diagnosis not present

## 2019-01-28 ENCOUNTER — Ambulatory Visit
Admission: RE | Admit: 2019-01-28 | Discharge: 2019-01-28 | Disposition: A | Discharge status: Home / Self Care | Payer: Medicare Other | Source: Ambulatory Visit | Attending: Radiation Oncology | Admitting: Radiation Oncology

## 2019-01-28 ENCOUNTER — Inpatient Hospital Stay: Payer: Medicare Other

## 2019-01-28 ENCOUNTER — Encounter: Payer: Self-pay | Admitting: Oncology

## 2019-01-28 ENCOUNTER — Encounter: Payer: Self-pay | Admitting: *Deleted

## 2019-01-28 ENCOUNTER — Inpatient Hospital Stay (HOSPITAL_BASED_OUTPATIENT_CLINIC_OR_DEPARTMENT_OTHER): Payer: Medicare Other | Admitting: Oncology

## 2019-01-28 ENCOUNTER — Other Ambulatory Visit: Payer: Self-pay

## 2019-01-28 VITALS — BP 130/55 | HR 73 | Temp 97.5°F | Wt 166.6 lb

## 2019-01-28 VITALS — Resp 18

## 2019-01-28 DIAGNOSIS — C3412 Malignant neoplasm of upper lobe, left bronchus or lung: Secondary | ICD-10-CM | POA: Diagnosis not present

## 2019-01-28 DIAGNOSIS — Z5111 Encounter for antineoplastic chemotherapy: Secondary | ICD-10-CM

## 2019-01-28 LAB — CBC WITH DIFFERENTIAL/PLATELET
Abs Immature Granulocytes: 0.03 10*3/uL (ref 0.00–0.07)
Basophils Absolute: 0 10*3/uL (ref 0.0–0.1)
Basophils Relative: 1 %
Eosinophils Absolute: 0.3 10*3/uL (ref 0.0–0.5)
Eosinophils Relative: 6 %
HCT: 33.7 % — ABNORMAL LOW (ref 36.0–46.0)
Hemoglobin: 11.1 g/dL — ABNORMAL LOW (ref 12.0–15.0)
Immature Granulocytes: 1 %
Lymphocytes Relative: 16 %
Lymphs Abs: 0.9 10*3/uL (ref 0.7–4.0)
MCH: 29.7 pg (ref 26.0–34.0)
MCHC: 32.9 g/dL (ref 30.0–36.0)
MCV: 90.1 fL (ref 80.0–100.0)
Monocytes Absolute: 0.4 10*3/uL (ref 0.1–1.0)
Monocytes Relative: 7 %
Neutro Abs: 3.8 10*3/uL (ref 1.7–7.7)
Neutrophils Relative %: 69 %
Platelets: 246 10*3/uL (ref 150–400)
RBC: 3.74 MIL/uL — ABNORMAL LOW (ref 3.87–5.11)
RDW: 12.5 % (ref 11.5–15.5)
WBC: 5.4 10*3/uL (ref 4.0–10.5)
nRBC: 0 % (ref 0.0–0.2)

## 2019-01-28 LAB — COMPREHENSIVE METABOLIC PANEL
ALT: 17 U/L (ref 0–44)
AST: 14 U/L — ABNORMAL LOW (ref 15–41)
Albumin: 3.6 g/dL (ref 3.5–5.0)
Alkaline Phosphatase: 66 U/L (ref 38–126)
Anion gap: 7 (ref 5–15)
BUN: 14 mg/dL (ref 8–23)
CO2: 23 mmol/L (ref 22–32)
Calcium: 8.7 mg/dL — ABNORMAL LOW (ref 8.9–10.3)
Chloride: 103 mmol/L (ref 98–111)
Creatinine, Ser: 0.31 mg/dL — ABNORMAL LOW (ref 0.44–1.00)
GFR calc Af Amer: >60 mL/min (ref >60)
GFR calc non Af Amer: >60 mL/min (ref >60)
Glucose, Bld: 102 mg/dL — ABNORMAL HIGH (ref 70–99)
Potassium: 4 mmol/L (ref 3.5–5.1)
Sodium: 133 mmol/L — ABNORMAL LOW (ref 135–145)
Total Bilirubin: 0.4 mg/dL (ref 0.3–1.2)
Total Protein: 6.1 g/dL — ABNORMAL LOW (ref 6.5–8.1)

## 2019-01-28 MED ORDER — HEPARIN SOD (PORK) LOCK FLUSH 100 UNIT/ML IV SOLN
500.0000 [IU] | Freq: Once | INTRAVENOUS | Status: AC
  Administered 2019-01-28: 500 [IU] via INTRAVENOUS
  Filled 2019-01-28: qty 5

## 2019-01-28 MED ORDER — FAMOTIDINE IN NACL 20-0.9 MG/50ML-% IV SOLN
20.0000 mg | Freq: Once | INTRAVENOUS | Status: AC
  Administered 2019-01-28: 20 mg via INTRAVENOUS
  Filled 2019-01-28: qty 50

## 2019-01-28 MED ORDER — DIPHENHYDRAMINE HCL 50 MG/ML IJ SOLN
50.0000 mg | Freq: Once | INTRAMUSCULAR | Status: AC
  Administered 2019-01-28: 12:00:00 50 mg via INTRAVENOUS
  Filled 2019-01-28: qty 1

## 2019-01-28 MED ORDER — SODIUM CHLORIDE 0.9 % IV SOLN
20.0000 mg | Freq: Once | INTRAVENOUS | Status: AC
  Administered 2019-01-28: 20 mg via INTRAVENOUS
  Filled 2019-01-28: qty 2

## 2019-01-28 MED ORDER — SODIUM CHLORIDE 0.9% FLUSH
10.0000 mL | INTRAVENOUS | Status: DC | PRN
  Administered 2019-01-28: 11:00:00 10 mL via INTRAVENOUS
  Filled 2019-01-28: qty 10

## 2019-01-28 MED ORDER — PALONOSETRON HCL INJECTION 0.25 MG/5ML
0.2500 mg | Freq: Once | INTRAVENOUS | Status: AC
  Administered 2019-01-28: 12:00:00 0.25 mg via INTRAVENOUS
  Filled 2019-01-28: qty 5

## 2019-01-28 MED ORDER — SODIUM CHLORIDE 0.9 % IV SOLN
171.2000 mg | Freq: Once | INTRAVENOUS | Status: AC
  Administered 2019-01-28: 15:00:00 170 mg via INTRAVENOUS
  Filled 2019-01-28: qty 17

## 2019-01-28 MED ORDER — SODIUM CHLORIDE 0.9 % IV SOLN
45.0000 mg/m2 | Freq: Once | INTRAVENOUS | Status: AC
  Administered 2019-01-28: 84 mg via INTRAVENOUS
  Filled 2019-01-28: qty 14

## 2019-01-28 MED ORDER — HEPARIN SOD (PORK) LOCK FLUSH 100 UNIT/ML IV SOLN: 500.0000 [IU] | Freq: Once | INTRAVENOUS | Status: DC | PRN

## 2019-01-28 MED ORDER — SODIUM CHLORIDE 0.9 % IV SOLN
Freq: Once | INTRAVENOUS | Status: AC
  Administered 2019-01-28: 12:00:00 via INTRAVENOUS
  Filled 2019-01-28: qty 250

## 2019-01-28 NOTE — Progress Notes (Signed)
Patient stated that she had been having some burning when she urinating and a headache. Patient stated that she took Keflex 6 days ago and she thinks this caused for her to have a possible UTI.

## 2019-01-29 ENCOUNTER — Telehealth: Payer: Self-pay

## 2019-01-29 ENCOUNTER — Ambulatory Visit
Admission: RE | Admit: 2019-01-29 | Discharge: 2019-01-29 | Disposition: A | Discharge status: Home / Self Care | Payer: Medicare Other | Source: Ambulatory Visit | Attending: Radiation Oncology | Admitting: Radiation Oncology

## 2019-01-29 ENCOUNTER — Other Ambulatory Visit: Payer: Self-pay

## 2019-01-29 DIAGNOSIS — C3412 Malignant neoplasm of upper lobe, left bronchus or lung: Secondary | ICD-10-CM | POA: Diagnosis not present

## 2019-01-29 NOTE — Progress Notes (Signed)
Hematology/Oncology Consult note [facility]  [phone] [phone]  Patient Care Team: [name], MD as PCP - General (Family Medicine) Telford Nab, RN as Registered Nurse   Name of the patient: Jennifer Hodges  [id number]  [birth year]   Date of visit: 01/29/19  Diagnosis- Adenocarcinoma of the lung stage III T1b N2 M0  Chief complaint/ Reason for visit-on treatment assessment prior to cycle 4 of weekly carbotaxol chemotherapy concurrent with radiation  Heme/Onc history: patient is a 73 year old female who was seen by pulmonary and ENT for ongoing symptoms of bronchitis and possible laryngitis and received antibiotics for the same.She underwent CT chest in September 2020 which showed multiple bilateral lung nodules with associated mediastinal adenopathy and possible primary left upper lobe lung mass. This was followed by a PET CT scan which showed a left upper lobe lung mass measuring 1.9 x 1.2 cm with an SUV of 10.7. Conglomerate AP window adenopathy measuring 5.8 x 2.1 cm with an SUV of 14. She was also noted to have hypermetabolic left hilar adenopathy and left supraclavicular 0.7 cm lymph node with an SUV of 4.6. Noted to have a 7 mm right lower lobe lung nodule with faint metabolic activity. No evidence of other distant metastatic disease. This was followed by a CT super D chest without contrast. The right lower lobe lung nodules were somewhat larger as compared to September 2020. 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 PD-L1 was 50%   Interval history-reports  that bright red blood per rectum as well as diarrhea has stopped.  Has an occasional headache.  Reports having burning urination over the last 1 day.  Denies any fever.  ECOG PS- 1 Pain scale- 0 Opioid associated constipation- no  Review of systems- Review of Systems  Constitutional: Positive for malaise/fatigue. Negative for chills, fever and weight loss.  HENT: Negative for congestion, ear discharge and nosebleeds.   Eyes: Negative for blurred vision.  Respiratory: Negative for cough, hemoptysis, sputum production, shortness of breath and wheezing.   Cardiovascular: Negative for chest pain, palpitations, orthopnea and claudication.  Gastrointestinal: Negative for abdominal pain, blood in stool, constipation, diarrhea, heartburn, melena, nausea and vomiting.  Genitourinary: Negative for dysuria, flank pain, frequency, hematuria and urgency.       Burning urination  Musculoskeletal: Negative for back pain, joint pain and myalgias.  Skin: Negative for rash.  Neurological: Negative for dizziness, tingling, focal weakness, seizures, weakness and headaches.  Endo/Heme/Allergies: Does not bruise/bleed easily.  Psychiatric/Behavioral: Negative for depression and suicidal ideas. The patient does not have insomnia.       Allergies  Allergen Reactions   Aspirin Hives and Other (See Comments)    Difficulty breathing   Celebrex [Celecoxib] Shortness Of Breath   Morphine And Related Nausea And Vomiting and Swelling   Adhesive [Tape] Other (See Comments)    Took top layer of skin off when removed.   Clarithromycin Nausea And Vomiting   Codeine Nausea And Vomiting   Darvon [Propoxyphene Hcl] Nausea And Vomiting   Demerol [Meperidine] Nausea And Vomiting   Flonase [Fluticasone Propionate] Other (See Comments)    Fungal infection   Simvastatin Other (See Comments)    "caused  ulcers in mouth, and fever"   Talwin [Pentazocine] Nausea And Vomiting     Past Medical History:    Diagnosis Date   Anemia    Asthma    No Inhalers--Dr. [name] will order as needed   Bronchiectasis ([place])    mild   Chronic headaches     followed by [facility] migraines   COPD (chronic obstructive pulmonary disease) (HCC)    DDD (degenerative disc disease), lumbar    Diabetes mellitus without complication [facility])    "doctor says I no longer have diabetes"     Diverticulosis    Family history of adverse reaction to anesthesia    PONV   Gall stones    history of   GERD (gastroesophageal reflux disease)    EGD 8/09- non bleeding erosive gastritis, documentd esophageal ulcerations.    Hiatal hernia    History of pneumonia    Hypercholesterolemia    IBS (irritable bowel syndrome)    Malignant neoplasm of upper lobe of left lung ([place]) 12/27/2018   Murmur    Osteoarthritis    lumbar disc disease, left hip   PONV (postoperative nausea and vomiting)    "Only with last hip and I believe it was due to the morphine"    Sleep apnea    cpap asked to bring mask and tubing   Weakness of right side of body      Past Surgical History:  Procedure Laterality Date   ABDOMINAL HYSTERECTOMY  age 23   ANTERIOR CERVICAL DECOMP/DISCECTOMY FUSION N/A 02/24/2015   Procedure: CERVICAL FOUR-FIVE, CERVICAL FIVE-SIX, CERVICAL SIX-SEVEN ANTERIOR CERVICAL DECOMPRESSION/DISCECTOMY FUSION ;  Surgeon: [name], MD;  Location: [facility];  Service: Neurosurgery;  Laterality: N/A;  C45 C56 C67 anterior cervical decompression with fusion interbody prosthesis plating and bonegraft   APPENDECTOMY     BACK SURGERY     4th lumbar fusion   BLADDER SURGERY N/A    with vaginal wall repair   BREAST CYST ASPIRATION Bilateral    neg   BREAST SURGERY Bilateral    cyst removed and reduction   CARDIAC CATHETERIZATION  2014   CARPAL TUNNEL RELEASE Right 02/11/2016   Procedure: CARPAL TUNNEL RELEASE;  Surgeon: [name], MD;  Location: [facility];  Service: Orthopedics;   Laterality: Right;   CATARACT EXTRACTION W/ INTRAOCULAR LENS IMPLANT Bilateral 2015   CHOLECYSTECTOMY     EUS N/A 05/31/2012   Procedure: UPPER ENDOSCOPIC ULTRASOUND (EUS) LINEAR;  Surgeon: [name], MD;  Location: [facility];  Service: Endoscopy;  Laterality: N/A;   EXCISIONAL HEMORRHOIDECTOMY     JOINT REPLACEMENT Bilateral    KNEE ARTHROSCOPY WITH LATERAL MENISECTOMY Right 07/07/2015   Procedure: KNEE ARTHROSCOPY WITH LATERAL MENISECTOMY, PARTIAL SYNOVECTOMY;  Surgeon: [name], MD;  Location: [facility];  Service: Orthopedics;  Laterality: Right;   LUMBAR LAMINECTOMY     PORTA CATH INSERTION N/A 01/07/2019   Procedure: PORTA CATH INSERTION;  Surgeon: [name], MD;  Location: [facility];  Service: Cardiovascular;  Laterality: N/A;   REDUCTION MAMMAPLASTY  1990   RIGHT OOPHORECTOMY     TOTAL HIP ARTHROPLASTY Left 05/01/2014   Dr. [name]   TOTAL HIP ARTHROPLASTY Right 08/05/2014   Procedure: TOTAL HIP ARTHROPLASTY ANTERIOR APPROACH;  Surgeon: [name], MD;  Location: [facility];  Service: Orthopedics;  Laterality: Right;   ULNAR NERVE TRANSPOSITION Right 02/11/2016   Procedure: ULNAR NERVE DECOMPRESSION/TRANSPOSITION;  Surgeon: [name], MD;  Location: [facility];  Service: Orthopedics;  Laterality: Right;   VIDEO BRONCHOSCOPY WITH ENDOBRONCHIAL ULTRASOUND Left 12/19/2018   Procedure: VIDEO BRONCHOSCOPY WITH ENDOBRONCHIAL ULTRASOUND, LEFT, SLEEP APNEA;  Surgeon: [name], MD;  Location: [facility];  Service: Thoracic;  Laterality: Left;    Social History   Socioeconomic History   Marital status: Married    Spouse name: Not on file   Number of children: Not on file   Years of education: Not on file   Highest education level: Not on file  Occupational History   Not on file  Social Needs   Financial resource strain: Not on file   Food insecurity    Worry: Not on file    Inability: Not on file   Transportation needs     Medical: Not on file    Non-medical: Not on file  Tobacco Use   Smoking status: Former Smoker    Quit date: 06/13/2007    Years since quitting: 11.6   Smokeless tobacco: Never Used  Substance and Sexual Activity   Alcohol use: No    Alcohol/week: 0.0 standard drinks   Drug use: No   Sexual activity: Never  Lifestyle   Physical activity    Days per week: Not on file    Minutes per session: Not on file   Stress: Not on file  Relationships   Social connections    Talks on phone: Not on file    Gets together: Not on file    Attends religious service: Not on file    Active member of club or organization: Not on file    Attends meetings of clubs or organizations: Not on file    Relationship status: Not on file   Intimate partner violence    Fear of current or ex partner: Not on file    Emotionally abused: Not on file    Physically abused: Not on file    Forced sexual activity: Not on file  Other Topics Concern   Not on file  Social History Narrative   Not on file    Family History  Problem Relation Age of Onset   Heart disease Mother        s/p stent   Hypertension Mother    Hypercholesterolemia Mother    Diabetes Father    Stomach cancer Other        uncle   Breast cancer Cousin    Breast cancer Paternal Aunt      Current Outpatient Medications:    albuterol (PROVENTIL) (2.5 MG/3ML) 0.083% nebulizer solution, Take 2.5 mg by nebulization every 6 (six) hours as needed for wheezing., Disp: , Rfl:    albuterol (VENTOLIN HFA) 108 (90 Base) MCG/ACT inhaler, Inhale 2 puffs into the lungs every 6 (six) hours as needed for wheezing or shortness of breath., Disp: 18 g, Rfl: 0   Bioflavonoid Products (ESTER C PO), Take 1 tablet by mouth 3 (three) times daily., Disp: , Rfl:    Calcium-Magnesium-Zinc (CAL-MAG-ZINC PO), Take 1 tablet by mouth daily., Disp: , Rfl:    cetirizine (ZYRTEC) 5 MG tablet, Take 1 tablet (5 mg total) by mouth daily. (Patient taking  differently: Take 5 mg by mouth daily as needed (runny nose.). ), Disp: 30 tablet, Rfl: 0   CHLORASEPTIC MAX SORE THROAT 1.5-33 % LIQD, Use as directed 1 spray in the mouth or throat as needed (throat irritation.)., Disp: , Rfl:    Cholecalciferol (VITAMIN D3) 25 MCG (1000 UT) CHEW, Chew 5,000 Units by mouth daily., Disp: , Rfl:  Coenzyme Q10 (CO Q 10) 100 MG CAPS, Take 100 mg by mouth daily. , Disp: , Rfl:    Cyanocobalamin (VITAMIN B-12) 2500 MCG SUBL, Place 2,500 mcg under the tongue daily., Disp: , Rfl:    cyclobenzaprine (FLEXERIL) 10 MG tablet, Take 10 mg by mouth at bedtime. , Disp: , Rfl:    dexamethasone (DECADRON) 4 MG tablet, Take 2 tablets (8 mg total) by mouth daily. Start the day after chemotherapy for 2 days., Disp: 30 tablet, Rfl: 1   DHEA 25 MG CAPS, Take 25 mg by mouth daily., Disp: , Rfl:    ezetimibe (ZETIA) 10 MG tablet, Take 10 mg by mouth at bedtime., Disp: , Rfl:    folic acid (FOLVITE) 016 MCG tablet, Take 400 mcg by mouth 2 (two) times daily. , Disp: , Rfl:    Garlic 5537 MG CAPS, Take 1,000 mg by mouth daily., Disp: , Rfl:    Histamine Dihydrochloride (AUSTRALIAN DREAM ARTHRITIS EX), Apply 1 application topically 4 (four) times daily as needed (pain.)., Disp: , Rfl:    HYDROcodone-acetaminophen (NORCO/VICODIN) 5-325 MG tablet, 1-2 tabs po bid prn (Patient taking differently: Take 1 tablet by mouth at bedtime. ), Disp: 10 tablet, Rfl: 0   HYDROcodone-homatropine (HYCODAN) 5-1.5 MG/5ML syrup, Take 5 mLs by mouth 3 (three) times daily as needed for cough., Disp: 120 mL, Rfl: 0   ipratropium (ATROVENT) 0.06 % nasal spray, Place 2 sprays into both nostrils 4 (four) times daily as needed for rhinitis., Disp: 15 mL, Rfl: 0   lidocaine-prilocaine (EMLA) cream, Apply to affected area once, Disp: 30 g, Rfl: 3   LORazepam (ATIVAN) 0.5 MG tablet, Take 1 tablet (0.5 mg total) by mouth every 6 (six) hours as needed (Nausea or vomiting)., Disp: 30 tablet, Rfl: 0    montelukast (SINGULAIR) 10 MG tablet, Take 10 mg by mouth at bedtime. , Disp: , Rfl:    ondansetron (ZOFRAN) 8 MG tablet, Take 1 tablet (8 mg total) by mouth 2 (two) times daily as needed for refractory nausea / vomiting. Start on day 3 after chemo., Disp: 30 tablet, Rfl: 1   ondansetron (ZOFRAN-ODT) 4 MG disintegrating tablet, Take 4 mg by mouth every 8 (eight) hours as needed for nausea/vomiting., Disp: , Rfl:    pantoprazole (PROTONIX) 40 MG tablet, Take 1 tablet (40 mg total) by mouth 2 (two) times daily before a meal., Disp: 60 tablet, Rfl: 1   Polyethyl Glycol-Propyl Glycol (SYSTANE) 0.4-0.3 % SOLN, Place 1 drop into both eyes 5 (five) times daily as needed (dry/irritated eyes). , Disp: , Rfl:    prochlorperazine (COMPAZINE) 10 MG tablet, TAKE 1 TABLET(10 MG) BY MOUTH EVERY 6 HOURS AS NEEDED FOR NAUSEA OR VOMITING, Disp: 30 tablet, Rfl: 1   pyridOXINE (VITAMIN B-6) 100 MG tablet, Take 100 mg by mouth daily., Disp: , Rfl:    umeclidinium-vilanterol (ANORO ELLIPTA) 62.5-25 MCG/INH AEPB, Inhale into the lungs., Disp: , Rfl:    vitamin E 400 UNIT capsule, Take 400 Units by mouth daily., Disp: , Rfl:    Zoledronic Acid (RECLAST IV), Inject 1 Dose into the vein as directed. ONCE A YEAR, Disp: , Rfl:   Physical exam:  Vitals:   01/28/19 1147  BP: (!) 130/55  Pulse: 73  Temp: (!) 97.5 F (36.4 C)  TempSrc: Tympanic  Weight: 166 lb 9.6 oz (75.6 kg)   Physical Exam Constitutional:      General: She is not in acute distress. HENT:     Head: Normocephalic and  atraumatic.  Eyes:     Pupils: Pupils are equal, round, and reactive to light.  Neck:     Musculoskeletal: Normal range of motion.  Cardiovascular:     Rate and Rhythm: Normal rate and regular rhythm.     Heart sounds: Normal heart sounds.  Pulmonary:     Effort: Pulmonary effort is normal.     Breath sounds: Normal breath sounds.  Abdominal:     General: Bowel sounds are normal. There is no distension.     Palpations:  Abdomen is soft.     Tenderness: There is no abdominal tenderness.  Skin:    General: Skin is warm and dry.  Neurological:     Mental Status: She is alert and oriented to person, place, and time.      CMP Latest Ref Rng & Units 01/28/2019  Glucose 70 - 99 mg/dL 102(H)  BUN 8 - 23 mg/dL 14  Creatinine 0.44 - 1.00 mg/dL 0.31(L)  Sodium 135 - 145 mmol/L 133(L)  Potassium 3.5 - 5.1 mmol/L 4.0  Chloride 98 - 111 mmol/L 103  CO2 22 - 32 mmol/L 23  Calcium 8.9 - 10.3 mg/dL 8.7(L)  Total Protein 6.5 - 8.1 g/dL 6.1(L)  Total Bilirubin 0.3 - 1.2 mg/dL 0.4  Alkaline Phos 38 - 126 U/L 66  AST 15 - 41 U/L 14(L)  ALT 0 - 44 U/L 17   CBC Latest Ref Rng & Units 01/28/2019  WBC 4.0 - 10.5 K/uL 5.4  Hemoglobin 12.0 - 15.0 g/dL 11.1(L)  Hematocrit 36.0 - 46.0 % 33.7(L)  Platelets 150 - 400 K/uL 246    No images are attached to the encounter.  Mr Jeri Cos Wo Contrast  Result Date: 01/09/2019 CLINICAL DATA:  Malignant neoplasm of upper lobe of left lung. Additional history provided: Lung cancer diagnosis August 2020, migraines EXAM: MRI HEAD WITHOUT AND WITH CONTRAST TECHNIQUE: Multiplanar, multiecho pulse sequences of the brain and surrounding structures were obtained without and with intravenous contrast. CONTRAST:  7.36m GADAVIST GADOBUTROL 1 MMOL/ML IV SOLN COMPARISON:  Whole-body PET-CT 12/03/2018, brain MRI 01/14/2011 FINDINGS: Brain: There is no convincing evidence of acute infarct. No evidence of intracranial mass. No midline shift or extra-axial fluid collection. No chronic intracranial blood products. No significant white matter disease for age. Cerebral volume is normal for age. No abnormal intracranial enhancement is identified. Vascular: Flow voids maintained within the proximal large arterial vessels. Skull and upper cervical spine: No focal marrow lesion. Partially visualized cervical spinal fusion hardware. Sinuses/Orbits: Visualized orbits demonstrate no acute abnormality. Small right  maxillary sinus mucous retention cyst. No significant mastoid effusion. IMPRESSION: No evidence of acute intracranial abnormality or intracranial metastatic disease. Electronically Signed   By: KKellie SimmeringDO   On: 01/09/2019 20:10   Ct Abdomen Pelvis W Contrast  Result Date: 01/21/2019 CLINICAL DATA:  Abdominal pain, lung cancer, on chemotherapy EXAM: CT ABDOMEN AND PELVIS WITH CONTRAST TECHNIQUE: Multidetector CT imaging of the abdomen and pelvis was performed using the standard protocol following bolus administration of intravenous contrast. CONTRAST:  1049mOMNIPAQUE IOHEXOL 300 MG/ML  SOLN COMPARISON:  PET-CT dated 12/03/2018 FINDINGS: Lower chest: Pulmonary nodules/metastases in the bilateral lower lobes, measuring up to 8 mm, favored to be mildly progressive. Hepatobiliary: Liver is within normal limits. Status post cholecystectomy. No intrahepatic or extrahepatic ductal dilatation. Pancreas: Within normal limits. Spleen: Within normal limits. Adrenals/Urinary Tract: Adrenal glands are within normal limits. Subcentimeter bilateral renal cysts. Kidneys are otherwise within normal limits. No hydronephrosis. Bladder is  within normal limits. Stomach/Bowel: Stomach is within normal limits. No evidence of bowel obstruction. Appendix is not discretely visualized. Mild cecal wall thickening (series 2/image 49), equivocal but possibly reflecting infectious/inflammatory colitis. Lower sigmoid colonic diverticulosis, without evidence of diverticulitis. Vascular/Lymphatic: No evidence of abdominal aortic aneurysm. Atherosclerotic calcifications of the abdominal aorta and branch vessels. No suspicious abdominopelvic lymphadenopathy. Reproductive: Status post hysterectomy. No adnexal masses. Other: No abdominopelvic ascites. Musculoskeletal: Mild degenerative changes at L1-2 and L4-5. Bilateral hip arthroplasties, without evidence of complication. IMPRESSION: Mild cecal wall thickening, possibly reflecting  infectious/inflammatory colitis. No evidence of bowel obstruction. Appendix not discretely visualized. Sigmoid diverticulosis, without evidence of diverticulitis. Pulmonary nodules/metastases at the lung bases, measuring up to 8 mm, favored to be mildly progressive. Electronically Signed   By: Julian Hy M.D.   On: 01/21/2019 15:07     Assessment and plan- Patient is a 73 y.o. female with stage III adenocarcinoma of the lung T1b N2 M0.   She is here for on treatment assessment prior to cycle 4 of weekly carbotaxol chemotherapy concurrent with radiation  Counts okay to proceed with cycle 4 of weekly carbotaxol chemotherapy today.  She will directly proceed for cycle 5 next week and I will see her back in 2 weeks for cycle 6.  Plan is to do 7 weekly cycles of chemotherapy followed by repeat scans.  Patient was having symptoms of abdominal cramping as well as bloody diarrhea 2 weeks ago.  CT abdomen did not reveal any acute diverticulitis.  She was seen by GI and has been given peppermint oil for possible IBS.  Symptoms have resolved since then.  Continue to monitor  Burning urination: I have asked her to give [place] a sample for urinalysis.  Hold off on empiric antibiotics until then   Visit Diagnosis 1. Encounter for antineoplastic chemotherapy   2. Malignant neoplasm of upper lobe of left lung ([place])      Dr. Randa Evens, MD, MPH [facility] at [facility] [phone] 01/29/2019 1:27 PM

## 2019-01-29 NOTE — Telephone Encounter (Signed)
Called patient to ask her how she was feeling. Patient stated that she was feeling better with her urine. I told patient to let us know if her symptoms got worse so we could collect an urine sample. Patient understood and had no further questions.

## 2019-01-30 ENCOUNTER — Other Ambulatory Visit: Payer: Self-pay | Admitting: *Deleted

## 2019-01-30 ENCOUNTER — Other Ambulatory Visit: Payer: Self-pay

## 2019-01-30 ENCOUNTER — Ambulatory Visit
Admission: RE | Admit: 2019-01-30 | Discharge: 2019-01-30 | Disposition: A | Discharge status: Home / Self Care | Payer: Medicare Other | Source: Ambulatory Visit | Attending: Radiation Oncology | Admitting: Radiation Oncology

## 2019-01-30 DIAGNOSIS — Z5111 Encounter for antineoplastic chemotherapy: Secondary | ICD-10-CM | POA: Diagnosis not present

## 2019-01-30 DIAGNOSIS — C3412 Malignant neoplasm of upper lobe, left bronchus or lung: Secondary | ICD-10-CM | POA: Diagnosis not present

## 2019-01-30 DIAGNOSIS — R300 Dysuria: Secondary | ICD-10-CM

## 2019-01-30 LAB — URINALYSIS, COMPLETE (UACMP) WITH MICROSCOPIC
Bacteria, UA: NONE SEEN
Bilirubin Urine: NEGATIVE
Glucose, UA: NEGATIVE mg/dL
Hgb urine dipstick: NEGATIVE
Ketones, ur: NEGATIVE mg/dL
Leukocytes,Ua: NEGATIVE
Nitrite: NEGATIVE
Protein, ur: NEGATIVE mg/dL
Specific Gravity, Urine: 1.012 (ref 1.005–1.030)
pH: 6 (ref 5.0–8.0)

## 2019-01-31 ENCOUNTER — Telehealth: Payer: Self-pay | Admitting: *Deleted

## 2019-01-31 ENCOUNTER — Ambulatory Visit
Admission: RE | Admit: 2019-01-31 | Discharge: 2019-01-31 | Disposition: A | Discharge status: Home / Self Care | Payer: Medicare Other | Source: Ambulatory Visit | Attending: Radiation Oncology | Admitting: Radiation Oncology

## 2019-01-31 ENCOUNTER — Other Ambulatory Visit: Payer: Self-pay

## 2019-01-31 DIAGNOSIS — C3412 Malignant neoplasm of upper lobe, left bronchus or lung: Secondary | ICD-10-CM | POA: Diagnosis not present

## 2019-01-31 LAB — URINE CULTURE: Culture: <10000 — AB

## 2019-01-31 NOTE — Telephone Encounter (Signed)
Pt found me the lobby prior to her radiation treatment today. Pt asked if she needed an antibiotic to help with her urinary symptoms. Per Dr. Elroy Channel result note, UA was negative and pt can try AZO or pyridium OTC. Pt has been made aware of MD recommendations and advised to call back if symptoms persist. Informed that can repeat UA if needed for persistent symptoms. Pt verbalized understanding.

## 2019-02-01 ENCOUNTER — Other Ambulatory Visit: Payer: Self-pay

## 2019-02-01 ENCOUNTER — Ambulatory Visit
Admission: RE | Admit: 2019-02-01 | Discharge: 2019-02-01 | Disposition: A | Discharge status: Home / Self Care | Payer: Medicare Other | Source: Ambulatory Visit | Attending: Radiation Oncology | Admitting: Radiation Oncology

## 2019-02-01 DIAGNOSIS — C3412 Malignant neoplasm of upper lobe, left bronchus or lung: Secondary | ICD-10-CM | POA: Diagnosis not present

## 2019-02-04 ENCOUNTER — Inpatient Hospital Stay: Payer: Medicare Other

## 2019-02-04 ENCOUNTER — Other Ambulatory Visit: Payer: Self-pay

## 2019-02-04 ENCOUNTER — Ambulatory Visit
Admission: RE | Admit: 2019-02-04 | Discharge: 2019-02-04 | Disposition: A | Discharge status: Home / Self Care | Payer: Medicare Other | Source: Ambulatory Visit | Attending: Radiation Oncology | Admitting: Radiation Oncology

## 2019-02-04 VITALS — BP 121/72 | HR 90 | Temp 97.5°F | Wt 166.4 lb

## 2019-02-04 DIAGNOSIS — Z5111 Encounter for antineoplastic chemotherapy: Secondary | ICD-10-CM | POA: Diagnosis not present

## 2019-02-04 DIAGNOSIS — C3412 Malignant neoplasm of upper lobe, left bronchus or lung: Secondary | ICD-10-CM | POA: Diagnosis not present

## 2019-02-04 DIAGNOSIS — Z95828 Presence of other vascular implants and grafts: Secondary | ICD-10-CM

## 2019-02-04 LAB — CBC WITH DIFFERENTIAL/PLATELET
Abs Immature Granulocytes: 0.02 10*3/uL (ref 0.00–0.07)
Basophils Absolute: 0 10*3/uL (ref 0.0–0.1)
Basophils Relative: 1 %
Eosinophils Absolute: 0.2 10*3/uL (ref 0.0–0.5)
Eosinophils Relative: 4 %
HCT: 34.2 % — ABNORMAL LOW (ref 36.0–46.0)
Hemoglobin: 10.9 g/dL — ABNORMAL LOW (ref 12.0–15.0)
Immature Granulocytes: 1 %
Lymphocytes Relative: 18 %
Lymphs Abs: 0.7 10*3/uL (ref 0.7–4.0)
MCH: 29.5 pg (ref 26.0–34.0)
MCHC: 31.9 g/dL (ref 30.0–36.0)
MCV: 92.4 fL (ref 80.0–100.0)
Monocytes Absolute: 0.3 10*3/uL (ref 0.1–1.0)
Monocytes Relative: 9 %
Neutro Abs: 2.5 10*3/uL (ref 1.7–7.7)
Neutrophils Relative %: 67 %
Platelets: 204 10*3/uL (ref 150–400)
RBC: 3.7 MIL/uL — ABNORMAL LOW (ref 3.87–5.11)
RDW: 13.2 % (ref 11.5–15.5)
WBC: 3.7 10*3/uL — ABNORMAL LOW (ref 4.0–10.5)
nRBC: 0 % (ref 0.0–0.2)

## 2019-02-04 LAB — COMPREHENSIVE METABOLIC PANEL
ALT: 17 U/L (ref 0–44)
AST: 14 U/L — ABNORMAL LOW (ref 15–41)
Albumin: 3.9 g/dL (ref 3.5–5.0)
Alkaline Phosphatase: 65 U/L (ref 38–126)
Anion gap: 4 — ABNORMAL LOW (ref 5–15)
BUN: 14 mg/dL (ref 8–23)
CO2: 27 mmol/L (ref 22–32)
Calcium: 8.7 mg/dL — ABNORMAL LOW (ref 8.9–10.3)
Chloride: 104 mmol/L (ref 98–111)
Creatinine, Ser: 0.51 mg/dL (ref 0.44–1.00)
GFR calc Af Amer: >60 mL/min (ref >60)
GFR calc non Af Amer: >60 mL/min (ref >60)
Glucose, Bld: 124 mg/dL — ABNORMAL HIGH (ref 70–99)
Potassium: 4.2 mmol/L (ref 3.5–5.1)
Sodium: 135 mmol/L (ref 135–145)
Total Bilirubin: 0.5 mg/dL (ref 0.3–1.2)
Total Protein: 6.5 g/dL (ref 6.5–8.1)

## 2019-02-04 MED ORDER — PALONOSETRON HCL INJECTION 0.25 MG/5ML
0.2500 mg | Freq: Once | INTRAVENOUS | Status: AC
  Administered 2019-02-04: 0.25 mg via INTRAVENOUS
  Filled 2019-02-04: qty 5

## 2019-02-04 MED ORDER — SODIUM CHLORIDE 0.9% FLUSH
10.0000 mL | Freq: Once | INTRAVENOUS | Status: AC
  Administered 2019-02-04: 10 mL via INTRAVENOUS
  Filled 2019-02-04: qty 10

## 2019-02-04 MED ORDER — SODIUM CHLORIDE 0.9 % IV SOLN
20.0000 mg | Freq: Once | INTRAVENOUS | Status: AC
  Administered 2019-02-04: 20 mg via INTRAVENOUS
  Filled 2019-02-04: qty 2

## 2019-02-04 MED ORDER — SODIUM CHLORIDE 0.9 % IV SOLN
171.2000 mg | Freq: Once | INTRAVENOUS | Status: AC
  Administered 2019-02-04: 170 mg via INTRAVENOUS
  Filled 2019-02-04: qty 17

## 2019-02-04 MED ORDER — SODIUM CHLORIDE 0.9 % IV SOLN
45.0000 mg/m2 | Freq: Once | INTRAVENOUS | Status: AC
  Administered 2019-02-04: 84 mg via INTRAVENOUS
  Filled 2019-02-04: qty 14

## 2019-02-04 MED ORDER — SODIUM CHLORIDE 0.9 % IV SOLN
Freq: Once | INTRAVENOUS | Status: AC
  Administered 2019-02-04: 11:00:00 via INTRAVENOUS
  Filled 2019-02-04: qty 250

## 2019-02-04 MED ORDER — DIPHENHYDRAMINE HCL 50 MG/ML IJ SOLN
50.0000 mg | Freq: Once | INTRAMUSCULAR | Status: AC
  Administered 2019-02-04: 50 mg via INTRAVENOUS
  Filled 2019-02-04: qty 1

## 2019-02-04 MED ORDER — FAMOTIDINE IN NACL 20-0.9 MG/50ML-% IV SOLN
20.0000 mg | Freq: Once | INTRAVENOUS | Status: AC
  Administered 2019-02-04: 20 mg via INTRAVENOUS
  Filled 2019-02-04: qty 50

## 2019-02-04 MED ORDER — HEPARIN SOD (PORK) LOCK FLUSH 100 UNIT/ML IV SOLN
500.0000 [IU] | Freq: Once | INTRAVENOUS | Status: AC | PRN
  Administered 2019-02-04: 500 [IU]
  Filled 2019-02-04: qty 5

## 2019-02-05 ENCOUNTER — Ambulatory Visit
Admission: RE | Admit: 2019-02-05 | Discharge: 2019-02-05 | Disposition: A | Discharge status: Home / Self Care | Payer: Medicare Other | Source: Ambulatory Visit | Attending: Radiation Oncology | Admitting: Radiation Oncology

## 2019-02-05 ENCOUNTER — Other Ambulatory Visit: Payer: Self-pay | Admitting: Family Medicine

## 2019-02-05 ENCOUNTER — Other Ambulatory Visit: Payer: Self-pay

## 2019-02-05 DIAGNOSIS — Z1231 Encounter for screening mammogram for malignant neoplasm of breast: Secondary | ICD-10-CM

## 2019-02-05 DIAGNOSIS — C3412 Malignant neoplasm of upper lobe, left bronchus or lung: Secondary | ICD-10-CM | POA: Diagnosis not present

## 2019-02-06 ENCOUNTER — Ambulatory Visit
Admission: RE | Admit: 2019-02-06 | Discharge: 2019-02-06 | Disposition: A | Discharge status: Home / Self Care | Payer: Medicare Other | Source: Ambulatory Visit | Attending: Radiation Oncology | Admitting: Radiation Oncology

## 2019-02-06 ENCOUNTER — Other Ambulatory Visit: Payer: Self-pay

## 2019-02-06 DIAGNOSIS — C3412 Malignant neoplasm of upper lobe, left bronchus or lung: Secondary | ICD-10-CM | POA: Diagnosis not present

## 2019-02-11 ENCOUNTER — Ambulatory Visit
Admission: RE | Admit: 2019-02-11 | Discharge: 2019-02-11 | Disposition: A | Discharge status: Home / Self Care | Payer: Medicare Other | Source: Ambulatory Visit | Attending: Radiation Oncology | Admitting: Radiation Oncology

## 2019-02-11 ENCOUNTER — Other Ambulatory Visit: Payer: Self-pay

## 2019-02-11 DIAGNOSIS — C3412 Malignant neoplasm of upper lobe, left bronchus or lung: Secondary | ICD-10-CM | POA: Diagnosis not present

## 2019-02-12 ENCOUNTER — Ambulatory Visit
Admission: RE | Admit: 2019-02-12 | Discharge: 2019-02-12 | Disposition: A | Discharge status: Home / Self Care | Payer: Medicare Other | Source: Ambulatory Visit | Attending: Radiation Oncology | Admitting: Radiation Oncology

## 2019-02-12 ENCOUNTER — Inpatient Hospital Stay (HOSPITAL_BASED_OUTPATIENT_CLINIC_OR_DEPARTMENT_OTHER): Payer: Medicare Other | Admitting: Oncology

## 2019-02-12 ENCOUNTER — Other Ambulatory Visit: Payer: Medicare Other

## 2019-02-12 ENCOUNTER — Other Ambulatory Visit: Payer: Self-pay

## 2019-02-12 ENCOUNTER — Ambulatory Visit: Payer: Medicare Other | Admitting: Oncology

## 2019-02-12 ENCOUNTER — Inpatient Hospital Stay: Payer: Medicare Other

## 2019-02-12 ENCOUNTER — Inpatient Hospital Stay: Payer: Medicare Other | Attending: Oncology

## 2019-02-12 ENCOUNTER — Encounter: Payer: Self-pay | Admitting: Oncology

## 2019-02-12 VITALS — BP 135/69 | HR 93 | Temp 97.3°F | Wt 166.9 lb

## 2019-02-12 DIAGNOSIS — D61810 Antineoplastic chemotherapy induced pancytopenia: Secondary | ICD-10-CM

## 2019-02-12 DIAGNOSIS — C3412 Malignant neoplasm of upper lobe, left bronchus or lung: Secondary | ICD-10-CM

## 2019-02-12 DIAGNOSIS — Z5111 Encounter for antineoplastic chemotherapy: Secondary | ICD-10-CM | POA: Diagnosis not present

## 2019-02-12 LAB — CBC WITH DIFFERENTIAL/PLATELET
Abs Immature Granulocytes: 0.01 10*3/uL (ref 0.00–0.07)
Basophils Absolute: 0 10*3/uL (ref 0.0–0.1)
Basophils Relative: 1 %
Eosinophils Absolute: 0.1 10*3/uL (ref 0.0–0.5)
Eosinophils Relative: 3 %
HCT: 33.8 % — ABNORMAL LOW (ref 36.0–46.0)
Hemoglobin: 10.9 g/dL — ABNORMAL LOW (ref 12.0–15.0)
Immature Granulocytes: 0 %
Lymphocytes Relative: 13 %
Lymphs Abs: 0.4 10*3/uL — ABNORMAL LOW (ref 0.7–4.0)
MCH: 30 pg (ref 26.0–34.0)
MCHC: 32.2 g/dL (ref 30.0–36.0)
MCV: 93.1 fL (ref 80.0–100.0)
Monocytes Absolute: 0.3 10*3/uL (ref 0.1–1.0)
Monocytes Relative: 10 %
Neutro Abs: 2.5 10*3/uL (ref 1.7–7.7)
Neutrophils Relative %: 73 %
Platelets: 143 10*3/uL — ABNORMAL LOW (ref 150–400)
RBC: 3.63 MIL/uL — ABNORMAL LOW (ref 3.87–5.11)
RDW: 14.1 % (ref 11.5–15.5)
WBC: 3.4 10*3/uL — ABNORMAL LOW (ref 4.0–10.5)
nRBC: 0 % (ref 0.0–0.2)

## 2019-02-12 LAB — COMPREHENSIVE METABOLIC PANEL
ALT: 16 U/L (ref 0–44)
AST: 14 U/L — ABNORMAL LOW (ref 15–41)
Albumin: 3.6 g/dL (ref 3.5–5.0)
Alkaline Phosphatase: 63 U/L (ref 38–126)
Anion gap: 5 (ref 5–15)
BUN: 13 mg/dL (ref 8–23)
CO2: 26 mmol/L (ref 22–32)
Calcium: 8.4 mg/dL — ABNORMAL LOW (ref 8.9–10.3)
Chloride: 106 mmol/L (ref 98–111)
Creatinine, Ser: 0.53 mg/dL (ref 0.44–1.00)
GFR calc Af Amer: >60 mL/min (ref >60)
GFR calc non Af Amer: >60 mL/min (ref >60)
Glucose, Bld: 108 mg/dL — ABNORMAL HIGH (ref 70–99)
Potassium: 3.7 mmol/L (ref 3.5–5.1)
Sodium: 137 mmol/L (ref 135–145)
Total Bilirubin: 0.5 mg/dL (ref 0.3–1.2)
Total Protein: 6 g/dL — ABNORMAL LOW (ref 6.5–8.1)

## 2019-02-12 MED ORDER — SODIUM CHLORIDE 0.9 % IV SOLN
45.0000 mg/m2 | Freq: Once | INTRAVENOUS | Status: AC
  Administered 2019-02-12: 84 mg via INTRAVENOUS
  Filled 2019-02-12: qty 14

## 2019-02-12 MED ORDER — FAMOTIDINE IN NACL 20-0.9 MG/50ML-% IV SOLN
20.0000 mg | Freq: Once | INTRAVENOUS | Status: AC
  Administered 2019-02-12: 20 mg via INTRAVENOUS
  Filled 2019-02-12: qty 50

## 2019-02-12 MED ORDER — HEPARIN SOD (PORK) LOCK FLUSH 100 UNIT/ML IV SOLN
500.0000 [IU] | Freq: Once | INTRAVENOUS | Status: DC
  Filled 2019-02-12: qty 5

## 2019-02-12 MED ORDER — SODIUM CHLORIDE 0.9 % IV SOLN
Freq: Once | INTRAVENOUS | Status: AC
  Administered 2019-02-12: 11:00:00 via INTRAVENOUS
  Filled 2019-02-12: qty 250

## 2019-02-12 MED ORDER — DIPHENHYDRAMINE HCL 50 MG/ML IJ SOLN
50.0000 mg | Freq: Once | INTRAMUSCULAR | Status: AC
  Administered 2019-02-12: 50 mg via INTRAVENOUS
  Filled 2019-02-12: qty 1

## 2019-02-12 MED ORDER — SODIUM CHLORIDE 0.9 % IV SOLN
20.0000 mg | Freq: Once | INTRAVENOUS | Status: AC
  Administered 2019-02-12: 20 mg via INTRAVENOUS
  Filled 2019-02-12: qty 2

## 2019-02-12 MED ORDER — SODIUM CHLORIDE 0.9% FLUSH
10.0000 mL | Freq: Once | INTRAVENOUS | Status: AC
  Administered 2019-02-12: 10 mL via INTRAVENOUS
  Filled 2019-02-12: qty 10

## 2019-02-12 MED ORDER — PALONOSETRON HCL INJECTION 0.25 MG/5ML
0.2500 mg | Freq: Once | INTRAVENOUS | Status: AC
  Administered 2019-02-12: 0.25 mg via INTRAVENOUS
  Filled 2019-02-12: qty 5

## 2019-02-12 MED ORDER — HEPARIN SOD (PORK) LOCK FLUSH 100 UNIT/ML IV SOLN
500.0000 [IU] | Freq: Once | INTRAVENOUS | Status: AC | PRN
  Administered 2019-02-12: 500 [IU]

## 2019-02-12 MED ORDER — SODIUM CHLORIDE 0.9 % IV SOLN
171.2000 mg | Freq: Once | INTRAVENOUS | Status: AC
  Administered 2019-02-12: 170 mg via INTRAVENOUS
  Filled 2019-02-12: qty 17

## 2019-02-12 NOTE — Progress Notes (Signed)
Patient stated that she will start therapy for her voice. Her ENT physician will send that referral.

## 2019-02-13 ENCOUNTER — Other Ambulatory Visit (HOSPITAL_COMMUNITY): Payer: Self-pay | Admitting: Gastroenterology

## 2019-02-13 ENCOUNTER — Ambulatory Visit
Admission: RE | Admit: 2019-02-13 | Discharge: 2019-02-13 | Disposition: A | Discharge status: Home / Self Care | Payer: Medicare Other | Source: Ambulatory Visit | Attending: Radiation Oncology | Admitting: Radiation Oncology

## 2019-02-13 ENCOUNTER — Other Ambulatory Visit: Payer: Self-pay

## 2019-02-13 ENCOUNTER — Ambulatory Visit
Admission: RE | Admit: 2019-02-13 | Discharge: 2019-02-13 | Disposition: A | Discharge status: Home / Self Care | Payer: Medicare Other | Source: Ambulatory Visit | Attending: Family Medicine | Admitting: Family Medicine

## 2019-02-13 ENCOUNTER — Other Ambulatory Visit: Payer: Self-pay | Admitting: Gastroenterology

## 2019-02-13 DIAGNOSIS — Z1231 Encounter for screening mammogram for malignant neoplasm of breast: Secondary | ICD-10-CM | POA: Diagnosis not present

## 2019-02-13 DIAGNOSIS — C3412 Malignant neoplasm of upper lobe, left bronchus or lung: Secondary | ICD-10-CM | POA: Diagnosis not present

## 2019-02-13 DIAGNOSIS — R1310 Dysphagia, unspecified: Secondary | ICD-10-CM

## 2019-02-14 ENCOUNTER — Other Ambulatory Visit: Payer: Self-pay

## 2019-02-14 ENCOUNTER — Ambulatory Visit: Payer: Medicare Other | Attending: Otolaryngology | Admitting: Speech Pathology

## 2019-02-14 ENCOUNTER — Ambulatory Visit
Admission: RE | Admit: 2019-02-14 | Discharge: 2019-02-14 | Disposition: A | Discharge status: Home / Self Care | Payer: Medicare Other | Source: Ambulatory Visit | Attending: Radiation Oncology | Admitting: Radiation Oncology

## 2019-02-14 DIAGNOSIS — R490 Dysphonia: Secondary | ICD-10-CM | POA: Diagnosis not present

## 2019-02-14 DIAGNOSIS — C3412 Malignant neoplasm of upper lobe, left bronchus or lung: Secondary | ICD-10-CM | POA: Diagnosis not present

## 2019-02-14 NOTE — Progress Notes (Signed)
Hematology/Oncology Consult note [facility]  [phone] Fax:([phone]  Patient Care Team: [name], MD as PCP - General (Family Medicine) Telford Nab, RN as Registered Nurse   Name of the patient: Jennifer Hodges  [id number]  [birth year]   Date of visit: 02/14/19  Diagnosis- Adenocarcinoma of the lung stage III T1b N2 M0   Chief complaint/ Reason for visit-on treatment assessment prior to cycle 5 of weekly carbotaxol chemotherapy concurrent with radiation  Heme/Onc history: patient is a 73 year old female who was seen by pulmonary and ENT for ongoing symptoms of bronchitis and possible laryngitis and received antibiotics for the same.She underwent CT chest in September 2020 which showed multiple bilateral lung nodules with associated mediastinal adenopathy and possible primary left upper lobe lung mass. This was followed by a PET CT scan which showed a left upper lobe lung mass measuring 1.9 x 1.2 cm with an SUV of 10.7. Conglomerate AP window adenopathy measuring 5.8 x 2.1 cm with an SUV of 14. She was also noted to have hypermetabolic left hilar adenopathy and left supraclavicular 0.7 cm lymph node with an SUV of 4.6. Noted to have a 7 mm right lower lobe lung nodule with faint metabolic activity. No evidence of other distant metastatic disease. This was followed by a CT super D chest without contrast. The right lower lobe lung nodules were somewhat larger as compared to September 2020. 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 PD-L1 was 50%   Interval  history-reports that her bowel movements are regular.  She has not noticed any blood in her stools.  She reports fatigue.  Burning urination has resolved with Azo.  ECOG PS- 1 Pain scale- 0   Review of systems- Review of Systems  Constitutional: Positive for malaise/fatigue. Negative for chills, fever and weight loss.  HENT: Negative for congestion, ear discharge and nosebleeds.   Eyes: Negative for blurred vision.  Respiratory: Negative for cough, hemoptysis, sputum production, shortness of breath and wheezing.   Cardiovascular: Negative for chest pain, palpitations, orthopnea and claudication.  Gastrointestinal: Negative for abdominal pain, blood in stool, constipation, diarrhea, heartburn, melena, nausea and vomiting.  Genitourinary: Negative for dysuria, flank pain, frequency, hematuria and urgency.  Musculoskeletal: Negative for back pain, joint pain and myalgias.  Skin: Negative for rash.  Neurological: Negative for dizziness, tingling, focal weakness, seizures, weakness and headaches.  Endo/Heme/Allergies: Does not bruise/bleed easily.  Psychiatric/Behavioral: Negative for depression and suicidal ideas. The patient does not have insomnia.       Allergies  Allergen Reactions  . Aspirin Hives and Other (See Comments)    Difficulty breathing  . Celebrex [Celecoxib] Shortness Of Breath  . Morphine And Related Nausea And Vomiting and Swelling  . Adhesive [Tape] Other (See Comments)    Took top layer of skin off when removed.  . Clarithromycin Nausea And Vomiting  . Codeine Nausea And Vomiting  . Darvon [Propoxyphene Hcl] Nausea And Vomiting  . Demerol [Meperidine] Nausea And Vomiting  . Flonase [Fluticasone Propionate] Other (See Comments)    Fungal infection  . Simvastatin Other (See Comments)    "caused ulcers in mouth, and fever"  . Talwin [Pentazocine] Nausea And Vomiting  Past Medical History:  Diagnosis Date  . Anemia   . Asthma    No Inhalers--Dr. [name]  will order as needed  . Bronchiectasis (HCC)    mild  . Chronic headaches     followed by [facility] migraines  . COPD (chronic obstructive pulmonary disease) ([place])   . DDD (degenerative disc disease), lumbar   . Diabetes mellitus without complication [facility])    "doctor says I no longer have diabetes"    . Diverticulosis   . Family history of adverse reaction to anesthesia    PONV  . Gall stones    history of  . GERD (gastroesophageal reflux disease)    EGD 8/09- non bleeding erosive gastritis, documentd esophageal ulcerations.   . Hiatal hernia   . History of pneumonia   . Hypercholesterolemia   . IBS (irritable bowel syndrome)   . Malignant neoplasm of upper lobe of left lung ([place]) 12/27/2018  . Murmur   . Osteoarthritis    lumbar disc disease, left hip  . PONV (postoperative nausea and vomiting)    "Only with last hip and I believe it was due to the morphine"   . Sleep apnea    cpap asked to bring mask and tubing  . Weakness of right side of body      Past Surgical History:  Procedure Laterality Date  . ABDOMINAL HYSTERECTOMY  age 30  . ANTERIOR CERVICAL DECOMP/DISCECTOMY FUSION N/A 02/24/2015   Procedure: CERVICAL FOUR-FIVE, CERVICAL FIVE-SIX, CERVICAL SIX-SEVEN ANTERIOR CERVICAL DECOMPRESSION/DISCECTOMY FUSION ;  Surgeon: [name], MD;  Location: [place] [facility];  Service: Neurosurgery;  Laterality: N/A;  C45 C56 C67 anterior cervical decompression with fusion interbody prosthesis plating and bonegraft  . APPENDECTOMY    . BACK SURGERY     4th lumbar fusion  . BLADDER SURGERY N/A    with vaginal wall repair  . BREAST CYST ASPIRATION Bilateral    neg  . BREAST SURGERY Bilateral    cyst removed and reduction  . CARDIAC CATHETERIZATION  2014  . CARPAL TUNNEL RELEASE Right 02/11/2016   Procedure: CARPAL TUNNEL RELEASE;  Surgeon: [name], MD;  Location: [facility];  Service: Orthopedics;  Laterality: Right;  . CATARACT EXTRACTION W/ INTRAOCULAR LENS IMPLANT  Bilateral 2015  . CHOLECYSTECTOMY    . EUS N/A 05/31/2012   Procedure: UPPER ENDOSCOPIC ULTRASOUND (EUS) LINEAR;  Surgeon: [name], MD;  Location: [facility];  Service: Endoscopy;  Laterality: N/A;  . EXCISIONAL HEMORRHOIDECTOMY    . JOINT REPLACEMENT Bilateral   . KNEE ARTHROSCOPY WITH LATERAL MENISECTOMY Right 07/07/2015   Procedure: KNEE ARTHROSCOPY WITH LATERAL MENISECTOMY, PARTIAL SYNOVECTOMY;  Surgeon: [name], MD;  Location: [facility];  Service: Orthopedics;  Laterality: Right;  . LUMBAR LAMINECTOMY    . PORTA CATH INSERTION N/A 01/07/2019   Procedure: PORTA CATH INSERTION;  Surgeon: [name], MD;  Location: [place] [facility];  Service: Cardiovascular;  Laterality: N/A;  . REDUCTION MAMMAPLASTY  1990  . RIGHT OOPHORECTOMY    . TOTAL HIP ARTHROPLASTY Left 05/01/2014   Dr. [name]  . TOTAL HIP ARTHROPLASTY Right 08/05/2014   Procedure: TOTAL HIP ARTHROPLASTY ANTERIOR APPROACH;  Surgeon: [name], MD;  Location: [facility];  Service: Orthopedics;  Laterality: Right;  . ULNAR NERVE TRANSPOSITION Right 02/11/2016   Procedure: ULNAR NERVE DECOMPRESSION/TRANSPOSITION;  Surgeon: [name], MD;  Location: [facility];  Service: Orthopedics;  Laterality: Right;  Marland Kitchen VIDEO BRONCHOSCOPY WITH ENDOBRONCHIAL ULTRASOUND Left 12/19/2018   Procedure: VIDEO BRONCHOSCOPY  WITH ENDOBRONCHIAL ULTRASOUND, LEFT, SLEEP APNEA;  Surgeon: [name], MD;  Location: [facility];  Service: Thoracic;  Laterality: Left;    Social History   Socioeconomic History  . Marital status: Married    Spouse name: Not on file  . Number of children: Not on file  . Years of education: Not on file  . Highest education level: Not on file  Occupational History  . Not on file  Social Needs  . Financial resource strain: Not on file  . Food insecurity    Worry: Not on file    Inability: Not on file  . Transportation needs    Medical: Not on file    Non-medical: Not on file  Tobacco Use  .  Smoking status: Former Smoker    Quit date: 06/13/2007    Years since quitting: 11.6  . Smokeless tobacco: Never Used  Substance and Sexual Activity  . Alcohol use: No    Alcohol/week: 0.0 standard drinks  . Drug use: No  . Sexual activity: Never  Lifestyle  . Physical activity    Days per week: Not on file    Minutes per session: Not on file  . Stress: Not on file  Relationships  . Social Herbalist on phone: Not on file    Gets together: Not on file    Attends religious service: Not on file    Active member of club or organization: Not on file    Attends meetings of clubs or organizations: Not on file    Relationship status: Not on file  . Intimate partner violence    Fear of current or ex partner: Not on file    Emotionally abused: Not on file    Physically abused: Not on file    Forced sexual activity: Not on file  Other Topics Concern  . Not on file  Social History Narrative  . Not on file    Family History  Problem Relation Age of Onset  . Heart disease Mother        s/p stent  . Hypertension Mother   . Hypercholesterolemia Mother   . Diabetes Father   . Stomach cancer Other        uncle  . Breast cancer Cousin   . Breast cancer Paternal Aunt      Current Outpatient Medications:  .  albuterol (PROVENTIL) (2.5 MG/3ML) 0.083% nebulizer solution, Take 2.5 mg by nebulization every 6 (six) hours as needed for wheezing., Disp: , Rfl:  .  albuterol (VENTOLIN HFA) 108 (90 Base) MCG/ACT inhaler, Inhale 2 puffs into the lungs every 6 (six) hours as needed for wheezing or shortness of breath., Disp: 18 g, Rfl: 0 .  Bioflavonoid Products (ESTER C PO), Take 1 tablet by mouth 3 (three) times daily., Disp: , Rfl:  .  Calcium-Magnesium-Zinc (CAL-MAG-ZINC PO), Take 1 tablet by mouth daily., Disp: , Rfl:  .  cetirizine (ZYRTEC) 5 MG tablet, Take 1 tablet (5 mg total) by mouth daily. (Patient taking differently: Take 5 mg by mouth daily as needed (runny nose.). ), Disp:  30 tablet, Rfl: 0 .  CHLORASEPTIC MAX SORE THROAT 1.5-33 % LIQD, Use as directed 1 spray in the mouth or throat as needed (throat irritation.)., Disp: , Rfl:  .  Cholecalciferol (VITAMIN D3) 25 MCG (1000 UT) CHEW, Chew 5,000 Units by mouth daily., Disp: , Rfl:  .  Coenzyme Q10 (CO Q 10) 100 MG CAPS, Take 100 mg by mouth daily. ,  Disp: , Rfl:  .  Cyanocobalamin (VITAMIN B-12) 2500 MCG SUBL, Place 2,500 mcg under the tongue daily., Disp: , Rfl:  .  cyclobenzaprine (FLEXERIL) 10 MG tablet, Take 10 mg by mouth at bedtime. , Disp: , Rfl:  .  dexamethasone (DECADRON) 4 MG tablet, Take 2 tablets (8 mg total) by mouth daily. Start the day after chemotherapy for 2 days., Disp: 30 tablet, Rfl: 1 .  DHEA 25 MG CAPS, Take 25 mg by mouth daily., Disp: , Rfl:  .  ezetimibe (ZETIA) 10 MG tablet, Take 10 mg by mouth at bedtime., Disp: , Rfl:  .  folic acid (FOLVITE) 407 MCG tablet, Take 400 mcg by mouth 2 (two) times daily. , Disp: , Rfl:  .  Garlic 6808 MG CAPS, Take 1,000 mg by mouth daily., Disp: , Rfl:  .  Histamine Dihydrochloride (AUSTRALIAN DREAM ARTHRITIS EX), Apply 1 application topically 4 (four) times daily as needed (pain.)., Disp: , Rfl:  .  HYDROcodone-acetaminophen (NORCO/VICODIN) 5-325 MG tablet, 1-2 tabs po bid prn (Patient taking differently: Take 1 tablet by mouth at bedtime. ), Disp: 10 tablet, Rfl: 0 .  HYDROcodone-homatropine (HYCODAN) 5-1.5 MG/5ML syrup, Take 5 mLs by mouth 3 (three) times daily as needed for cough., Disp: 120 mL, Rfl: 0 .  ipratropium (ATROVENT) 0.06 % nasal spray, Place 2 sprays into both nostrils 4 (four) times daily as needed for rhinitis., Disp: 15 mL, Rfl: 0 .  lidocaine-prilocaine (EMLA) cream, Apply to affected area once, Disp: 30 g, Rfl: 3 .  LORazepam (ATIVAN) 0.5 MG tablet, Take 1 tablet (0.5 mg total) by mouth every 6 (six) hours as needed (Nausea or vomiting)., Disp: 30 tablet, Rfl: 0 .  montelukast (SINGULAIR) 10 MG tablet, Take 10 mg by mouth at bedtime. ,  Disp: , Rfl:  .  ondansetron (ZOFRAN) 8 MG tablet, Take 1 tablet (8 mg total) by mouth 2 (two) times daily as needed for refractory nausea / vomiting. Start on day 3 after chemo., Disp: 30 tablet, Rfl: 1 .  ondansetron (ZOFRAN-ODT) 4 MG disintegrating tablet, Take 4 mg by mouth every 8 (eight) hours as needed for nausea/vomiting., Disp: , Rfl:  .  pantoprazole (PROTONIX) 40 MG tablet, Take 1 tablet (40 mg total) by mouth 2 (two) times daily before a meal., Disp: 60 tablet, Rfl: 1 .  Polyethyl Glycol-Propyl Glycol (SYSTANE) 0.4-0.3 % SOLN, Place 1 drop into both eyes 5 (five) times daily as needed (dry/irritated eyes). , Disp: , Rfl:  .  prochlorperazine (COMPAZINE) 10 MG tablet, TAKE 1 TABLET(10 MG) BY MOUTH EVERY 6 HOURS AS NEEDED FOR NAUSEA OR VOMITING, Disp: 30 tablet, Rfl: 1 .  pyridOXINE (VITAMIN B-6) 100 MG tablet, Take 100 mg by mouth daily., Disp: , Rfl:  .  umeclidinium-vilanterol (ANORO ELLIPTA) 62.5-25 MCG/INH AEPB, Inhale into the lungs., Disp: , Rfl:  .  vitamin E 400 UNIT capsule, Take 400 Units by mouth daily., Disp: , Rfl:  .  Zoledronic Acid (RECLAST IV), Inject 1 Dose into the vein as directed. ONCE A YEAR, Disp: , Rfl:   Physical exam:  Vitals:   02/12/19 1022  BP: 135/69  Pulse: 93  Temp: (!) 97.3 F (36.3 C)  TempSrc: Tympanic  Weight: 166 lb 14.4 oz (75.7 kg)   Physical Exam Constitutional:      General: She is not in acute distress. HENT:     Head: Normocephalic and atraumatic.  Eyes:     Pupils: Pupils are equal, round, and reactive to light.  Neck:     Musculoskeletal: Normal range of motion.  Cardiovascular:     Rate and Rhythm: Normal rate and regular rhythm.     Heart sounds: Normal heart sounds.  Pulmonary:     Effort: Pulmonary effort is normal.     Breath sounds: Normal breath sounds.  Abdominal:     General: Bowel sounds are normal.     Palpations: Abdomen is soft.  Skin:    General: Skin is warm and dry.  Neurological:     Mental Status: She  is alert and oriented to person, place, and time.      CMP Latest Ref Rng & Units 02/12/2019  Glucose 70 - 99 mg/dL 108(H)  BUN 8 - 23 mg/dL 13  Creatinine 0.44 - 1.00 mg/dL 0.53  Sodium 135 - 145 mmol/L 137  Potassium 3.5 - 5.1 mmol/L 3.7  Chloride 98 - 111 mmol/L 106  CO2 22 - 32 mmol/L 26  Calcium 8.9 - 10.3 mg/dL 8.4(L)  Total Protein 6.5 - 8.1 g/dL 6.0(L)  Total Bilirubin 0.3 - 1.2 mg/dL 0.5  Alkaline Phos 38 - 126 U/L 63  AST 15 - 41 U/L 14(L)  ALT 0 - 44 U/L 16   CBC Latest Ref Rng & Units 02/12/2019  WBC 4.0 - 10.5 K/uL 3.4(L)  Hemoglobin 12.0 - 15.0 g/dL 10.9(L)  Hematocrit 36.0 - 46.0 % 33.8(L)  Platelets 150 - 400 K/uL 143(L)    No images are attached to the encounter.  Ct Abdomen Pelvis W Contrast  Result Date: 01/21/2019 CLINICAL DATA:  Abdominal pain, lung cancer, on chemotherapy EXAM: CT ABDOMEN AND PELVIS WITH CONTRAST TECHNIQUE: Multidetector CT imaging of the abdomen and pelvis was performed using the standard protocol following bolus administration of intravenous contrast. CONTRAST:  161m OMNIPAQUE IOHEXOL 300 MG/ML  SOLN COMPARISON:  PET-CT dated 12/03/2018 FINDINGS: Lower chest: Pulmonary nodules/metastases in the bilateral lower lobes, measuring up to 8 mm, favored to be mildly progressive. Hepatobiliary: Liver is within normal limits. Status post cholecystectomy. No intrahepatic or extrahepatic ductal dilatation. Pancreas: Within normal limits. Spleen: Within normal limits. Adrenals/Urinary Tract: Adrenal glands are within normal limits. Subcentimeter bilateral renal cysts. Kidneys are otherwise within normal limits. No hydronephrosis. Bladder is within normal limits. Stomach/Bowel: Stomach is within normal limits. No evidence of bowel obstruction. Appendix is not discretely visualized. Mild cecal wall thickening (series 2/image 49), equivocal but possibly reflecting infectious/inflammatory colitis. Lower sigmoid colonic diverticulosis, without evidence of  diverticulitis. Vascular/Lymphatic: No evidence of abdominal aortic aneurysm. Atherosclerotic calcifications of the abdominal aorta and branch vessels. No suspicious abdominopelvic lymphadenopathy. Reproductive: Status post hysterectomy. No adnexal masses. Other: No abdominopelvic ascites. Musculoskeletal: Mild degenerative changes at L1-2 and L4-5. Bilateral hip arthroplasties, without evidence of complication. IMPRESSION: Mild cecal wall thickening, possibly reflecting infectious/inflammatory colitis. No evidence of bowel obstruction. Appendix not discretely visualized. Sigmoid diverticulosis, without evidence of diverticulitis. Pulmonary nodules/metastases at the lung bases, measuring up to 8 mm, favored to be mildly progressive. Electronically Signed   By: SJulian HyM.D.   On: 01/21/2019 15:07   Mm 3d Screen Breast Bilateral  Result Date: 02/13/2019 CLINICAL DATA:  Screening. EXAM: DIGITAL SCREENING BILATERAL MAMMOGRAM WITH TOMO AND CAD COMPARISON:  Previous exam(s). ACR Breast Density Category b: There are scattered areas of fibroglandular density. FINDINGS: There are no findings suspicious for malignancy. Images were processed with CAD. IMPRESSION: No mammographic evidence of malignancy. A result letter of this screening mammogram will be mailed directly to the patient. RECOMMENDATION: Screening mammogram in one  year. (Code:SM-B-01Y) BI-RADS CATEGORY  1: Negative. Electronically Signed   By: Margarette Canada M.D.   On: 02/13/2019 15:11     Assessment and plan- Patient is a 73 y.o. female  with stage III adenocarcinoma of the lung T1b N2 M0.   She is here for treatment assessment prior to cycle 5 of weekly carbotaxol chemotherapy with radiation  Counts are okay to proceed with cycle 5 of weekly carbotaxol chemotherapy today.  She will directly proceed for cycle 6 next week and I will see her back in 2 weeks for cycle 7.  Plan to repeat scans after concurrent chemoradiation is done and if she has  partial response or stable disease she will proceed with maintenance durvalumab at that time  Mild pancytopenia likely secondary to chemotherapy.  Continue to monitor     Visit Diagnosis 1. Malignant neoplasm of upper lobe of left lung ([place])   2. Encounter for antineoplastic chemotherapy   3. Antineoplastic chemotherapy induced pancytopenia (CODE) (HCC)      Dr. Randa Evens, MD, MPH [facility] [phone] 02/14/2019 11:16 AM

## 2019-02-15 ENCOUNTER — Ambulatory Visit
Admission: RE | Admit: 2019-02-15 | Discharge: 2019-02-15 | Disposition: A | Discharge status: Home / Self Care | Payer: Medicare Other | Source: Ambulatory Visit | Attending: Radiation Oncology | Admitting: Radiation Oncology

## 2019-02-15 ENCOUNTER — Other Ambulatory Visit: Payer: Self-pay

## 2019-02-15 ENCOUNTER — Ambulatory Visit: Payer: Medicare Other | Admitting: Oncology

## 2019-02-15 ENCOUNTER — Encounter: Payer: Self-pay | Admitting: Speech Pathology

## 2019-02-15 ENCOUNTER — Other Ambulatory Visit: Payer: Medicare Other

## 2019-02-15 DIAGNOSIS — C3412 Malignant neoplasm of upper lobe, left bronchus or lung: Secondary | ICD-10-CM | POA: Diagnosis not present

## 2019-02-15 NOTE — Therapy (Signed)
[place] MAIN [facility] SERVICES [address] [place], [place], [address] Phone: [phone]   Fax:  [phone]  Speech Language Pathology Evaluation  Patient Details  Name: Jennifer Hodges MRN: [id number] Date of Birth: 1945-07-02 Referring Provider (SLP): Dr. Pryor Ochoa   Encounter Date: 02/14/2019  End of Session - 02/15/19 1408    Visit Number  1    Number of Visits  17    Date for SLP Re-Evaluation  04/12/19    Authorization Type  Medicare    Authorization Time Period  Start 02/14/2019    Authorization - Visit Number  1    Authorization - Number of Visits  10    SLP Start Time  1300    SLP Stop Time   1400    SLP Time Calculation (min)  60 min    Activity Tolerance  Patient tolerated treatment well       Past Medical History:  Diagnosis Date   Anemia    Asthma    No Inhalers--Dr. [name] will order as needed   Bronchiectasis ([place])    mild   Chronic headaches     followed by [facility] migraines   COPD (chronic obstructive pulmonary disease) ([place])    DDD (degenerative disc disease), lumbar    Diabetes mellitus without complication ([place])    "doctor says I no longer have diabetes"     Diverticulosis    Family history of adverse reaction to anesthesia    PONV   Gall stones    history of   GERD (gastroesophageal reflux disease)    EGD 8/09- non bleeding erosive gastritis, documentd esophageal ulcerations.    Hiatal hernia    History of pneumonia    Hypercholesterolemia    IBS (irritable bowel syndrome)    Malignant neoplasm of upper lobe of left lung ([place]) 12/27/2018   Murmur    Osteoarthritis    lumbar disc disease, left hip   PONV (postoperative nausea and vomiting)    "Only with last hip and I believe it was due to the morphine"    Sleep apnea    cpap asked to bring mask and tubing   Weakness of right side of body     Past Surgical History:  Procedure Laterality Date   ABDOMINAL HYSTERECTOMY   age 73   ANTERIOR CERVICAL DECOMP/DISCECTOMY FUSION N/A 02/24/2015   Procedure: CERVICAL FOUR-FIVE, CERVICAL FIVE-SIX, CERVICAL SIX-SEVEN ANTERIOR CERVICAL DECOMPRESSION/DISCECTOMY FUSION ;  Surgeon: [name], MD;  Location: [place] [facility];  Service: Neurosurgery;  Laterality: N/A;  C45 C56 C67 anterior cervical decompression with fusion interbody prosthesis plating and bonegraft   APPENDECTOMY     BACK SURGERY     4th lumbar fusion   BLADDER SURGERY N/A    with vaginal wall repair   BREAST CYST ASPIRATION Bilateral    neg   BREAST SURGERY Bilateral    cyst removed and reduction   CARDIAC CATHETERIZATION  2014   CARPAL TUNNEL RELEASE Right 02/11/2016   Procedure: CARPAL TUNNEL RELEASE;  Surgeon: [name], MD;  Location: [facility] ORS;  Service: Orthopedics;  Laterality: Right;   CATARACT EXTRACTION W/ INTRAOCULAR LENS IMPLANT Bilateral 2015   CHOLECYSTECTOMY     EUS N/A 05/31/2012   Procedure: UPPER ENDOSCOPIC ULTRASOUND (EUS) LINEAR;  Surgeon: [name], MD;  Location: [facility];  Service: Endoscopy;  Laterality: N/A;   EXCISIONAL HEMORRHOIDECTOMY     JOINT REPLACEMENT Bilateral    KNEE ARTHROSCOPY WITH LATERAL MENISECTOMY Right  07/07/2015   Procedure: KNEE ARTHROSCOPY WITH LATERAL MENISECTOMY, PARTIAL SYNOVECTOMY;  Surgeon: [name], MD;  Location: [facility] ORS;  Service: Orthopedics;  Laterality: Right;   LUMBAR LAMINECTOMY     PORTA CATH INSERTION N/A 01/07/2019   Procedure: PORTA CATH INSERTION;  Surgeon: [name], MD;  Location: [place] [facility];  Service: Cardiovascular;  Laterality: N/A;   REDUCTION MAMMAPLASTY  1990   RIGHT OOPHORECTOMY     TOTAL HIP ARTHROPLASTY Left 05/01/2014   Dr. [name]   TOTAL HIP ARTHROPLASTY Right 08/05/2014   Procedure: TOTAL HIP ARTHROPLASTY ANTERIOR APPROACH;  Surgeon: [name], MD;  Location: [facility] ORS;  Service: Orthopedics;  Laterality: Right;   ULNAR NERVE TRANSPOSITION Right 02/11/2016    Procedure: ULNAR NERVE DECOMPRESSION/TRANSPOSITION;  Surgeon: [name], MD;  Location: [facility];  Service: Orthopedics;  Laterality: Right;   VIDEO BRONCHOSCOPY WITH ENDOBRONCHIAL ULTRASOUND Left 12/19/2018   Procedure: VIDEO BRONCHOSCOPY WITH ENDOBRONCHIAL ULTRASOUND, LEFT, SLEEP APNEA;  Surgeon: [name], MD;  Location: [facility] ORS;  Service: Thoracic;  Laterality: Left;    There were no vitals filed for this visit.      SLP Evaluation [facility] - 02/15/19 0001      SLP Visit Information   SLP Received On  02/14/19    Referring Provider (SLP)  Dr. Pryor Ochoa    Onset Date  01/11/2019    Medical Diagnosis  Dysphonia      Subjective   Subjective   "It's tiring to talk"    Patient/Family Stated Goal  Stronger voice with better vocal quality      Pain Assessment   Currently in Pain?  No/denies      General Information   HPI  Jennifer Hodges is 73 year old woman with vocal quality change since May 2020.  The patient has a history of COPD and recent diagnosis of lung cancer, currently undergoing chemoradiation.  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      Prior Functional Status   Cognitive/Linguistic Baseline  Within functional limits      Oral Motor/Sensory Function   Overall Oral Motor/Sensory Function  Appears within functional limits for tasks assessed      Motor Speech   Overall Motor Speech  Impaired    Respiration  Impaired    Phonation  Low vocal intensity;Hoarse    Resonance  Within functional limits    Articulation  Within functional limitis    Intelligibility  Intelligible    Phonation  Impaired    Vocal Abuses  Habitual Hyperphonia;Habitual Cough/Throat Clear;Vocal Fold Dehydration    Tension Present  Jaw;Neck;Shoulder    Volume  Soft    Pitch  High      Standardized Assessments   Standardized Assessments   Other Assessment   Perceptual Voice Evaluation       Perceptual Voice Evaluation Voice checklist:  Health risks: COPD, lung  cancer, chemoradiation treatment for lung cancer, GERD, allergies, xerostomia   Characteristic voice use: patient is fairly talkative  Environmental risks: dry environment  Misuse: excessive talking, speaking without good breath support  Abuse: throat clearing  Vocal characteristics: breathy, limited voice range, poor vocal projection, excessive pharyngeal resonance, habitual high pitch Maximum phonation time for sustained ah: 5 seconds Average fundamental frequency during sustained ah: 478 Hz (8 STD above average for gender) Habitual pitch: 478 hz Highest dynamic pitch when altering pitch from a low note to a high note: Patient cannot appreciably change pitch Lowest dynamic pitch when altering from a high note  to a low note: Patient cannot appreciably change pitch Highest dynamic pitch in conversational speech: monotone at high Hz Lowest dynamic pitch in conversational speech: monotone at high  Hz Average time patient was able to sustain /s/: 5 seconds Average time patient was able to sustain /z/: 4 seconds s/z ratio : 1.25 Visi-Pitch: Multi-Dimensional Voice Program (MDVP)  MDVP extracts objective quantitative values (Relative Average Perturbation, Shimmer, Voice Turbulence Index, and Noise to Harmonic Ratio) on sustained phonation, which are displayed graphically and numerically in comparison to a built-in normative database.  The patient exhibited values outside the norm for Relative Average Perturbation, Shimmer, Voice Turbulence Index, and Noise to Harmonic Ratio.  Average fundamental frequency was 8 STD above average for age and gender.    SLP Education - 02/15/19 1407    Education Details  Patient instructed in abdominal breathing and supplement vocal tract relaxation exercises (trill).    Person(s) Educated  Patient    Methods  Explanation;Demonstration;Handout    Comprehension  Verbalized understanding;Need further instruction         SLP Long Term Goals - 02/15/19  1411      SLP [place] #1   Title  The patient will be independent for abdominal breathing and breath support exercises.    Time  8    Period  Weeks    Status  New    Target Date  04/12/19      SLP LONG TERM GOAL #2   Title  The patient will minimize vocal tension via resonant voice therapy (or comparable technique) with min SLP cues with 80% accuracy.    Time  8    Period  Weeks    Status  New    Target Date  04/12/19      SLP LONG TERM GOAL #3   Title  The patient will maintain relaxed phonation / oral resonance for paragraph length recitation with 80% accuracy.    Time  8    Period  Weeks    Status  New    Target Date  04/12/19       Plan - 02/15/19 1409    Clinical Impression Statement  This 74 year old woman under the care of Dr. Pryor Ochoa, with vocal cord paralysis in the setting of COPD and lung cancer, is presenting with moderate-severe dysphonia.  The patient demonstrates hoarse vocal quality, clavicular breathing, reduced breath control for speech, excessive pharyngeal resonance, strained/tense phonation, limited pitch range, vocal fatigue, and laryngeal tension. She will benefit from voice therapy for education, to improve breath support, improve tone focus, promote easy flow phonation, and learn techniques to increase loudness and pitch range without strain.    Speech Therapy Frequency  2x / week    Duration  Other (comment)   8 weeks   Treatment/Interventions  Patient/family education;Other (comment)   Voice therapy   Potential to Achieve Goals  Fair    Potential Considerations  Ability to learn/carryover information;Previous level of function;Co-morbidities;Severity of impairments;Cooperation/participation level;Medical prognosis;Family/community support    SLP Home Exercise Plan  Provided    Consulted and Agree with Plan of Care  Patient       Patient will benefit from skilled therapeutic intervention in order to improve the following deficits and impairments:    Dysphonia - Plan: SLP plan of care cert/re-cert    Problem List Patient Active Problem List   Diagnosis Date Noted   Benign breast lumps 01/11/2019   Gout 01/11/2019   Hives 01/11/2019   Lung disease  01/11/2019   Migraine headache 01/11/2019   Goals of care, counseling/discussion 12/27/2018   Malignant neoplasm of upper lobe of left lung ([place]) 12/27/2018   Night sweats 08/07/2016   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   Osteopenia of multiple sites 04/27/2016   Left carpal tunnel syndrome 02/02/2016   Carotid stenosis, asymptomatic, bilateral 12/29/2015   Prediabetes 09/28/2015   Primary osteoarthritis involving multiple joints 09/28/2015   Knee pain 05/11/2015   Chest tightness 03/24/2015   Stool incontinence 03/24/2015   Urinary frequency 03/04/2015   Cervical spondylosis with radiculopathy 02/24/2015   Pre-syncope 02/19/2015   URI (upper respiratory infection) 02/15/2015   Neck pain 12/25/2014   Pelvic pain in female 11/16/2014   Primary osteoarthritis of hip 08/05/2014   Heel pain 06/01/2014   Diarrhea 06/01/2014   Health care maintenance 06/01/2014   Right leg pain 03/30/2014   Right arm pain 03/30/2014   Sinusitis 03/30/2014   Internal nasal lesion 11/12/2013   Other specified disorders of nose and nasal sinuses 11/12/2013   Chronic tension-type headache, intractable 10/30/2013   Intractable migraine without aura and without status migrainosus 10/30/2013   Occipital neuralgia of left side 10/30/2013   IBS (irritable bowel syndrome) 09/03/2013   Rib pain 08/04/2013   Cervical radiculitis 08/01/2013   Lumbar radiculitis 08/01/2013   Palpitations 07/25/2013   Benign essential hypertension 05/28/2013   Neuralgia, neuritis, and radiculitis, unspecified 05/28/2013   Vitamin D deficiency 05/28/2013   Elevated AFP 04/28/2013   Abnormality of alpha-fetoprotein 04/28/2013   Osteoporosis 11/12/2012   Incomplete  emptying of bladder 07/11/2012   Prolapse of vaginal vault after hysterectomy 07/11/2012   Nonspecific (abnormal) findings on radiological and other examination of gastrointestinal tract 05/31/2012   Bronchiectasis ([place]) 02/05/2012   Sleep apnea 02/05/2012   Degenerative disc disease, cervical 02/05/2012   Hypercholesterolemia 02/05/2012   GERD (gastroesophageal reflux disease) 02/05/2012   Chronic headaches 02/05/2012   Leroy Sea, MS/CCC- SLP  Lou Miner 02/15/2019, 2:15 PM  [place] [facility] MAIN [facility] SERVICES [address] [place], [place], [address] Phone: [phone]   Fax:  [phone]  Name: RUFUS CYPERT MRN: [id number] Date of Birth: [birth year]

## 2019-02-16 ENCOUNTER — Encounter: Payer: Self-pay | Admitting: Internal Medicine

## 2019-02-16 ENCOUNTER — Telehealth: Payer: Self-pay | Admitting: Internal Medicine

## 2019-02-16 NOTE — Telephone Encounter (Signed)
Patient called about worsening pain with swallowing-symptom suggestive of radiation esophagitis.  Given instructions-of how to take Carafate; 4 times a day.  Also will prescribe Magic mouthwash.  If not improved recommend call us back on 12/07-for possible evaluation the clinic.   GB

## 2019-02-18 ENCOUNTER — Other Ambulatory Visit: Payer: Self-pay

## 2019-02-18 ENCOUNTER — Ambulatory Visit
Admission: RE | Admit: 2019-02-18 | Discharge: 2019-02-18 | Disposition: A | Discharge status: Home / Self Care | Payer: Medicare Other | Source: Ambulatory Visit | Attending: Radiation Oncology | Admitting: Radiation Oncology

## 2019-02-18 ENCOUNTER — Encounter: Payer: Self-pay | Admitting: *Deleted

## 2019-02-18 DIAGNOSIS — C3412 Malignant neoplasm of upper lobe, left bronchus or lung: Secondary | ICD-10-CM | POA: Diagnosis not present

## 2019-02-19 ENCOUNTER — Inpatient Hospital Stay: Payer: Medicare Other

## 2019-02-19 ENCOUNTER — Ambulatory Visit
Admission: RE | Admit: 2019-02-19 | Discharge: 2019-02-19 | Disposition: A | Discharge status: Home / Self Care | Payer: Medicare Other | Source: Ambulatory Visit | Attending: Radiation Oncology | Admitting: Radiation Oncology

## 2019-02-19 ENCOUNTER — Encounter: Payer: Medicare Other | Admitting: Speech Pathology

## 2019-02-19 ENCOUNTER — Other Ambulatory Visit: Payer: Self-pay

## 2019-02-19 VITALS — BP 113/68 | HR 92 | Temp 97.5°F | Resp 20 | Wt 167.0 lb

## 2019-02-19 DIAGNOSIS — Z5111 Encounter for antineoplastic chemotherapy: Secondary | ICD-10-CM | POA: Diagnosis not present

## 2019-02-19 DIAGNOSIS — C3412 Malignant neoplasm of upper lobe, left bronchus or lung: Secondary | ICD-10-CM

## 2019-02-19 LAB — CBC WITH DIFFERENTIAL/PLATELET
Abs Immature Granulocytes: 0.01 10*3/uL (ref 0.00–0.07)
Basophils Absolute: 0 10*3/uL (ref 0.0–0.1)
Basophils Relative: 0 %
Eosinophils Absolute: 0.1 10*3/uL (ref 0.0–0.5)
Eosinophils Relative: 2 %
HCT: 31.1 % — ABNORMAL LOW (ref 36.0–46.0)
Hemoglobin: 10.4 g/dL — ABNORMAL LOW (ref 12.0–15.0)
Immature Granulocytes: 0 %
Lymphocytes Relative: 18 %
Lymphs Abs: 0.5 10*3/uL — ABNORMAL LOW (ref 0.7–4.0)
MCH: 30.8 pg (ref 26.0–34.0)
MCHC: 33.4 g/dL (ref 30.0–36.0)
MCV: 92 fL (ref 80.0–100.0)
Monocytes Absolute: 0.3 10*3/uL (ref 0.1–1.0)
Monocytes Relative: 9 %
Neutro Abs: 2 10*3/uL (ref 1.7–7.7)
Neutrophils Relative %: 71 %
Platelets: 143 10*3/uL — ABNORMAL LOW (ref 150–400)
RBC: 3.38 MIL/uL — ABNORMAL LOW (ref 3.87–5.11)
RDW: 14.6 % (ref 11.5–15.5)
WBC: 2.9 10*3/uL — ABNORMAL LOW (ref 4.0–10.5)
nRBC: 0 % (ref 0.0–0.2)

## 2019-02-19 LAB — COMPREHENSIVE METABOLIC PANEL
ALT: 16 U/L (ref 0–44)
AST: 14 U/L — ABNORMAL LOW (ref 15–41)
Albumin: 3.8 g/dL (ref 3.5–5.0)
Alkaline Phosphatase: 69 U/L (ref 38–126)
Anion gap: 8 (ref 5–15)
BUN: 16 mg/dL (ref 8–23)
CO2: 25 mmol/L (ref 22–32)
Calcium: 8.8 mg/dL — ABNORMAL LOW (ref 8.9–10.3)
Chloride: 104 mmol/L (ref 98–111)
Creatinine, Ser: 0.37 mg/dL — ABNORMAL LOW (ref 0.44–1.00)
GFR calc Af Amer: >60 mL/min (ref >60)
GFR calc non Af Amer: >60 mL/min (ref >60)
Glucose, Bld: 119 mg/dL — ABNORMAL HIGH (ref 70–99)
Potassium: 3.4 mmol/L — ABNORMAL LOW (ref 3.5–5.1)
Sodium: 137 mmol/L (ref 135–145)
Total Bilirubin: 0.5 mg/dL (ref 0.3–1.2)
Total Protein: 6.3 g/dL — ABNORMAL LOW (ref 6.5–8.1)

## 2019-02-19 MED ORDER — HEPARIN SOD (PORK) LOCK FLUSH 100 UNIT/ML IV SOLN
500.0000 [IU] | Freq: Once | INTRAVENOUS | Status: AC | PRN
  Administered 2019-02-19: 500 [IU]
  Filled 2019-02-19: qty 5

## 2019-02-19 MED ORDER — SODIUM CHLORIDE 0.9 % IV SOLN
45.0000 mg/m2 | Freq: Once | INTRAVENOUS | Status: AC
  Administered 2019-02-19: 84 mg via INTRAVENOUS
  Filled 2019-02-19: qty 14

## 2019-02-19 MED ORDER — SODIUM CHLORIDE 0.9 % IV SOLN
Freq: Once | INTRAVENOUS | Status: AC
  Administered 2019-02-19: 09:00:00 via INTRAVENOUS
  Filled 2019-02-19: qty 250

## 2019-02-19 MED ORDER — SODIUM CHLORIDE 0.9 % IV SOLN
171.2000 mg | Freq: Once | INTRAVENOUS | Status: AC
  Administered 2019-02-19: 11:00:00 170 mg via INTRAVENOUS
  Filled 2019-02-19: qty 17

## 2019-02-19 MED ORDER — SODIUM CHLORIDE 0.9% FLUSH
10.0000 mL | INTRAVENOUS | Status: DC | PRN
  Administered 2019-02-19: 09:00:00 10 mL
  Filled 2019-02-19: qty 10

## 2019-02-19 MED ORDER — PALONOSETRON HCL INJECTION 0.25 MG/5ML
0.2500 mg | Freq: Once | INTRAVENOUS | Status: AC
  Administered 2019-02-19: 0.25 mg via INTRAVENOUS
  Filled 2019-02-19: qty 5

## 2019-02-19 MED ORDER — SODIUM CHLORIDE 0.9 % IV SOLN
20.0000 mg | Freq: Once | INTRAVENOUS | Status: AC
  Administered 2019-02-19: 10:00:00 20 mg via INTRAVENOUS
  Filled 2019-02-19: qty 2

## 2019-02-19 MED ORDER — FAMOTIDINE IN NACL 20-0.9 MG/50ML-% IV SOLN
20.0000 mg | Freq: Once | INTRAVENOUS | Status: AC
  Administered 2019-02-19: 09:00:00 20 mg via INTRAVENOUS
  Filled 2019-02-19: qty 50

## 2019-02-19 MED ORDER — DIPHENHYDRAMINE HCL 50 MG/ML IJ SOLN
50.0000 mg | Freq: Once | INTRAMUSCULAR | Status: AC
  Administered 2019-02-19: 09:00:00 50 mg via INTRAVENOUS
  Filled 2019-02-19: qty 1

## 2019-02-20 ENCOUNTER — Ambulatory Visit
Admission: RE | Admit: 2019-02-20 | Discharge: 2019-02-20 | Disposition: A | Discharge status: Home / Self Care | Payer: Medicare Other | Source: Ambulatory Visit | Attending: Radiation Oncology | Admitting: Radiation Oncology

## 2019-02-20 ENCOUNTER — Other Ambulatory Visit: Payer: Self-pay

## 2019-02-20 ENCOUNTER — Other Ambulatory Visit: Payer: Self-pay | Admitting: Otolaryngology

## 2019-02-20 DIAGNOSIS — R633 Feeding difficulties, unspecified: Secondary | ICD-10-CM

## 2019-02-20 DIAGNOSIS — C3412 Malignant neoplasm of upper lobe, left bronchus or lung: Secondary | ICD-10-CM | POA: Diagnosis not present

## 2019-02-20 DIAGNOSIS — R1312 Dysphagia, oropharyngeal phase: Secondary | ICD-10-CM

## 2019-02-20 DIAGNOSIS — R490 Dysphonia: Secondary | ICD-10-CM

## 2019-02-21 ENCOUNTER — Ambulatory Visit: Payer: Medicare Other | Admitting: Speech Pathology

## 2019-02-21 ENCOUNTER — Ambulatory Visit
Admission: RE | Admit: 2019-02-21 | Discharge: 2019-02-21 | Disposition: A | Discharge status: Home / Self Care | Payer: Medicare Other | Source: Ambulatory Visit | Attending: Radiation Oncology | Admitting: Radiation Oncology

## 2019-02-21 ENCOUNTER — Other Ambulatory Visit: Payer: Self-pay

## 2019-02-21 DIAGNOSIS — R490 Dysphonia: Secondary | ICD-10-CM

## 2019-02-21 DIAGNOSIS — C3412 Malignant neoplasm of upper lobe, left bronchus or lung: Secondary | ICD-10-CM | POA: Diagnosis not present

## 2019-02-22 ENCOUNTER — Encounter: Payer: Self-pay | Admitting: Speech Pathology

## 2019-02-22 ENCOUNTER — Other Ambulatory Visit: Payer: Self-pay

## 2019-02-22 ENCOUNTER — Ambulatory Visit
Admission: RE | Admit: 2019-02-22 | Discharge: 2019-02-22 | Disposition: A | Discharge status: Home / Self Care | Payer: Medicare Other | Source: Ambulatory Visit | Attending: Radiation Oncology | Admitting: Radiation Oncology

## 2019-02-22 DIAGNOSIS — C3412 Malignant neoplasm of upper lobe, left bronchus or lung: Secondary | ICD-10-CM | POA: Diagnosis not present

## 2019-02-22 NOTE — Therapy (Signed)
[place] MAIN [place] [facility] SERVICES [address] [place], [place], [address] Phone: [phone]   Fax:  [phone]  Speech Language Pathology Treatment  Patient Details  Name: Jennifer Hodges MRN: [id number] Date of Birth: 1945/11/14 Referring Provider (SLP): Dr. Pryor Ochoa   Encounter Date: 02/21/2019  End of Session - 02/22/19 1523    Visit Number  2    Number of Visits  17    Date for SLP Re-Evaluation  04/12/19    Authorization Type  Medicare    Authorization Time Period  Start 02/14/2019    Authorization - Visit Number  2    Authorization - Number of Visits  10    SLP Start Time  1300    SLP Stop Time   1350    SLP Time Calculation (min)  50 min    Activity Tolerance  Patient tolerated treatment well       Past Medical History:  Diagnosis Date  . Anemia   . Asthma    No Inhalers--Dr. [name] will order as needed  . Bronchiectasis ([facility])    mild  . Chronic headaches     followed by [facility] migraines  . COPD (chronic obstructive pulmonary disease) ([place])   . DDD (degenerative disc disease), lumbar   . Diabetes mellitus without complication [facility])    "doctor says I no longer have diabetes"    . Diverticulosis   . Family history of adverse reaction to anesthesia    PONV  . Gall stones    history of  . GERD (gastroesophageal reflux disease)    EGD 8/09- non bleeding erosive gastritis, documentd esophageal ulcerations.   . Hiatal hernia   . History of pneumonia   . Hypercholesterolemia   . IBS (irritable bowel syndrome)   . Malignant neoplasm of upper lobe of left lung ([place]) 12/27/2018  . Murmur   . Osteoarthritis    lumbar disc disease, left hip  . PONV (postoperative nausea and vomiting)    "Only with last hip and I believe it was due to the morphine"   . Sleep apnea    cpap asked to bring mask and tubing  . Weakness of right side of body     Past Surgical History:  Procedure Laterality Date  . ABDOMINAL HYSTERECTOMY   age 48  . ANTERIOR CERVICAL DECOMP/DISCECTOMY FUSION N/A 02/24/2015   Procedure: CERVICAL FOUR-FIVE, CERVICAL FIVE-SIX, CERVICAL SIX-SEVEN ANTERIOR CERVICAL DECOMPRESSION/DISCECTOMY FUSION ;  Surgeon: [name], MD;  Location: [place] [facility];  Service: Neurosurgery;  Laterality: N/A;  C45 C56 C67 anterior cervical decompression with fusion interbody prosthesis plating and bonegraft  . APPENDECTOMY    . BACK SURGERY     4th lumbar fusion  . BLADDER SURGERY N/A    with vaginal wall repair  . BREAST CYST ASPIRATION Bilateral    neg  . BREAST SURGERY Bilateral    cyst removed and reduction  . CARDIAC CATHETERIZATION  2014  . CARPAL TUNNEL RELEASE Right 02/11/2016   Procedure: CARPAL TUNNEL RELEASE;  Surgeon: [name], MD;  Location: [facility] OR[facility];  Service: Orthopedics;  Laterality: Right;  . CATARACT EXTRACTION W/ INTRAOCULAR LENS IMPLANT Bilateral 2015  . CHOLECYSTECTOMY    . EUS N/A 05/31/2012   Procedure: UPPER ENDOSCOPIC ULTRASOUND (EUS) LINEAR;  Surgeon: [name], MD;  Location: [facility];  Service: Endoscopy;  Laterality: N/A;  . EXCISIONAL HEMORRHOIDECTOMY    . JOINT REPLACEMENT Bilateral   . KNEE ARTHROSCOPY WITH LATERAL MENISECTOMY Right  07/07/2015   Procedure: KNEE ARTHROSCOPY WITH LATERAL MENISECTOMY, PARTIAL SYNOVECTOMY;  Surgeon: [name], MD;  Location: [facility] OR[facility];  Service: Orthopedics;  Laterality: Right;  . LUMBAR LAMINECTOMY    . PORTA CATH INSERTION N/A 01/07/2019   Procedure: PORTA CATH INSERTION;  Surgeon: [name], MD;  Location: [place] [facility] LAB;  Service: Cardiovascular;  Laterality: N/A;  . REDUCTION MAMMAPLASTY  1990  . RIGHT OOPHORECTOMY    . TOTAL HIP ARTHROPLASTY Left 05/01/2014   Dr. [name]  . TOTAL HIP ARTHROPLASTY Right 08/05/2014   Procedure: TOTAL HIP ARTHROPLASTY ANTERIOR APPROACH;  Surgeon: [name], MD;  Location: [facility];  Service: Orthopedics;  Laterality: Right;  . ULNAR NERVE TRANSPOSITION Right 02/11/2016    Procedure: ULNAR NERVE DECOMPRESSION/TRANSPOSITION;  Surgeon: [name], MD;  Location: [facility];  Service: Orthopedics;  Laterality: Right;  Marland Kitchen VIDEO BRONCHOSCOPY WITH ENDOBRONCHIAL ULTRASOUND Left 12/19/2018   Procedure: VIDEO BRONCHOSCOPY WITH ENDOBRONCHIAL ULTRASOUND, LEFT, SLEEP APNEA;  Surgeon: [name], MD;  Location: [facility] OR[facility];  Service: Thoracic;  Laterality: Left;    There were no vitals filed for this visit.  Subjective Assessment - 02/22/19 1522    Subjective  "I just get so tired talking"            ADULT SLP TREATMENT - 02/22/19 0001      General Information   Behavior/Cognition  Alert;Cooperative;Pleasant mood    HPI  Albirta Rhinehart is 73 year old woman with vocal quality change since May 2020.  The patient has a history of COPD and recent diagnosis of lung cancer, currently undergoing chemoradiation.  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       Treatment Provided   Treatment provided  Cognitive-Linquistic      Pain Assessment   Pain Assessment  No/denies pain      Cognitive-Linquistic Treatment   Treatment focused on  Voice    Skilled Treatment  The patient was provided with written and verbal teaching regarding vocal hygiene.  The patient was provided with written and verbal teaching regarding neck, shoulder, tongue, and throat stretches exercises to promote relaxed phonation. The patient states that talking leaves her exhausted and sore. The patient was provided with written and verbal teaching regarding breath support exercises.  She demonstrates much less clavicular breathing and improved airflow.  Patient instructed in flow phonation through skill level 1: establish airflow release: Unarticulated: significant improvement in duration of airflow.  Articulated (unvoiced): patient demonstrates emerging mastery of maintaining airflow while moving articulators.      Assessment / Recommendations / Plan   Plan  Continue with current plan of  care      Progression Toward Goals   Progression toward goals  Progressing toward goals       SLP Education - 02/22/19 1522    Education Details  Flow phonation, neck, throat, tongue stretches    Person(s) Educated  Patient    Methods  Explanation    Comprehension  Verbalized understanding         SLP Long Term Goals - 02/15/19 1411      SLP LONG TERM GOAL #1   Title  The patient will be independent for abdominal breathing and breath support exercises.    Time  8    Period  Weeks    Status  New    Target Date  04/12/19      SLP LONG TERM GOAL #2   Title  The patient will minimize vocal tension via resonant voice  therapy (or comparable technique) with min SLP cues with 80% accuracy.    Time  8    Period  Weeks    Status  New    Target Date  04/12/19      SLP LONG TERM GOAL #3   Title  The patient will maintain relaxed phonation / oral resonance for paragraph length recitation with 80% accuracy.    Time  8    Period  Weeks    Status  New    Target Date  04/12/19       Plan - 02/22/19 1524    Clinical Impression Statement  The patient is responding well to initial step of flow phonation therapy.  She has improved her duration for unarticulated airflow and demonstrates emerging mastery of maintaining airflow while moving articulators.  The patient has ingrained habit of breath holding while trying to talk, resulting tight hoarse voice and vocal fatigue.  Next week, will introduce airflow with voicing.    Speech Therapy Frequency  2x / week    Duration  Other (comment)    Treatment/Interventions  Patient/family education;Other (comment)    Potential to Achieve Goals  Fair    Potential Considerations  Ability to learn/carryover information;Previous level of function;Co-morbidities;Severity of impairments;Cooperation/participation level;Medical prognosis;Family/community support    SLP Home Exercise Plan  Provided    Consulted and Agree with Plan of Care  Patient        Patient will benefit from skilled therapeutic intervention in order to improve the following deficits and impairments:   Dysphonia    Problem List Patient Active Problem List   Diagnosis Date Noted  . Benign breast lumps 01/11/2019  . Gout 01/11/2019  . Hives 01/11/2019  . Lung disease 01/11/2019  . Migraine headache 01/11/2019  . Goals of care, counseling/discussion 12/27/2018  . Malignant neoplasm of upper lobe of left lung ([place]) 12/27/2018  . Night sweats 08/07/2016  . 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 Osteopenia of multiple sites 04/27/2016  . Left carpal tunnel syndrome 02/02/2016  . Carotid stenosis, asymptomatic, bilateral 12/29/2015  . Prediabetes 09/28/2015  . Primary osteoarthritis involving multiple joints 09/28/2015  . Knee pain 05/11/2015  . Chest tightness 03/24/2015  . Stool incontinence 03/24/2015  . Urinary frequency 03/04/2015  . Cervical spondylosis with radiculopathy 02/24/2015  . Pre-syncope 02/19/2015  . URI (upper respiratory infection) 02/15/2015  . Neck pain 12/25/2014  . Pelvic pain in female 11/16/2014  . Primary osteoarthritis of hip 08/05/2014  . Heel pain 06/01/2014  . Diarrhea 06/01/2014  . Health care maintenance 06/01/2014  . Right leg pain 03/30/2014  . Right arm pain 03/30/2014  . Sinusitis 03/30/2014  . Internal nasal lesion 11/12/2013  . Other specified disorders of nose and nasal sinuses 11/12/2013  . Chronic tension-type headache, intractable 10/30/2013  . Intractable migraine without aura and without status migrainosus 10/30/2013  . Occipital neuralgia of left side 10/30/2013  . IBS (irritable bowel syndrome) 09/03/2013  . Rib pain 08/04/2013  . Cervical radiculitis 08/01/2013  . Lumbar radiculitis 08/01/2013  . Palpitations 07/25/2013  . Benign essential hypertension 05/28/2013  . Neuralgia, neuritis, and radiculitis, unspecified 05/28/2013  . Vitamin D deficiency 05/28/2013  . Elevated AFP 04/28/2013  .  Abnormality of alpha-fetoprotein 04/28/2013  . Osteoporosis 11/12/2012  . Incomplete emptying of bladder 07/11/2012  . Prolapse of vaginal vault after hysterectomy 07/11/2012  . Nonspecific (abnormal) findings on radiological and other examination of gastrointestinal tract 05/31/2012  . Bronchiectasis ([place]) 02/05/2012  . Sleep apnea 02/05/2012  .  Degenerative disc disease, cervical 02/05/2012  . Hypercholesterolemia 02/05/2012  . GERD (gastroesophageal reflux disease) 02/05/2012  . Chronic headaches 02/05/2012   Leroy Sea, MS/CCC- SLP  Lou Miner 02/22/2019, 3:26 PM  [place] MAIN [facility] SERVICES [address] [place], [place], [address] Phone: [phone]   Fax:  [phone]   Name: NIAYA HICKOK MRN: [id number] Date of Birth: 19-Nov-1945

## 2019-02-25 ENCOUNTER — Ambulatory Visit: Admission: RE | Admit: 2019-02-25 | Payer: Medicare Other | Source: Ambulatory Visit

## 2019-02-25 ENCOUNTER — Other Ambulatory Visit: Payer: Self-pay | Admitting: *Deleted

## 2019-02-25 ENCOUNTER — Other Ambulatory Visit: Payer: Self-pay

## 2019-02-25 MED ORDER — AZITHROMYCIN 250 MG PO TABS: ORAL_TABLET | ORAL | 0 refills | Status: DC

## 2019-02-26 ENCOUNTER — Inpatient Hospital Stay: Payer: Medicare Other

## 2019-02-26 ENCOUNTER — Ambulatory Visit: Payer: Medicare Other | Admitting: Oncology

## 2019-02-26 ENCOUNTER — Ambulatory Visit: Payer: Medicare Other

## 2019-02-26 ENCOUNTER — Other Ambulatory Visit: Payer: Medicare Other

## 2019-02-26 ENCOUNTER — Ambulatory Visit: Payer: Medicare Other | Admitting: Speech Pathology

## 2019-02-26 ENCOUNTER — Inpatient Hospital Stay: Payer: Medicare Other | Admitting: Oncology

## 2019-02-26 NOTE — Telephone Encounter (Signed)
Called Mrs. Sferrazza regarding her missed appt this morning. She states that due to a low grade fever of 99-101, a cough, and stomach pains, she was instructed to start taking erythromycin and to stay home a few days from the radiation or infusion team. Dr. Elroy Channel team was not aware.

## 2019-02-27 ENCOUNTER — Ambulatory Visit: Payer: Medicare Other

## 2019-02-28 ENCOUNTER — Ambulatory Visit
Admission: RE | Admit: 2019-02-28 | Discharge: 2019-02-28 | Disposition: A | Discharge status: Home / Self Care | Payer: Medicare Other | Source: Ambulatory Visit | Attending: Radiation Oncology | Admitting: Radiation Oncology

## 2019-02-28 ENCOUNTER — Other Ambulatory Visit: Payer: Medicare Other

## 2019-02-28 ENCOUNTER — Ambulatory Visit: Payer: Medicare Other | Admitting: Speech Pathology

## 2019-02-28 ENCOUNTER — Other Ambulatory Visit: Payer: Self-pay

## 2019-02-28 ENCOUNTER — Ambulatory Visit: Payer: Medicare Other | Admitting: Oncology

## 2019-02-28 DIAGNOSIS — C3412 Malignant neoplasm of upper lobe, left bronchus or lung: Secondary | ICD-10-CM | POA: Diagnosis not present

## 2019-03-01 ENCOUNTER — Ambulatory Visit
Admission: RE | Admit: 2019-03-01 | Discharge: 2019-03-01 | Disposition: A | Discharge status: Home / Self Care | Payer: Medicare Other | Source: Ambulatory Visit | Attending: Radiation Oncology | Admitting: Radiation Oncology

## 2019-03-01 ENCOUNTER — Other Ambulatory Visit: Payer: Self-pay

## 2019-03-01 DIAGNOSIS — C3412 Malignant neoplasm of upper lobe, left bronchus or lung: Secondary | ICD-10-CM | POA: Diagnosis not present

## 2019-03-04 ENCOUNTER — Ambulatory Visit
Admission: RE | Admit: 2019-03-04 | Discharge: 2019-03-04 | Disposition: A | Discharge status: Home / Self Care | Payer: Medicare Other | Source: Ambulatory Visit | Attending: Radiation Oncology | Admitting: Radiation Oncology

## 2019-03-04 ENCOUNTER — Other Ambulatory Visit: Payer: Self-pay

## 2019-03-04 ENCOUNTER — Ambulatory Visit: Payer: Medicare Other | Admitting: Speech Pathology

## 2019-03-04 DIAGNOSIS — R490 Dysphonia: Secondary | ICD-10-CM

## 2019-03-04 DIAGNOSIS — C3412 Malignant neoplasm of upper lobe, left bronchus or lung: Secondary | ICD-10-CM | POA: Diagnosis not present

## 2019-03-05 ENCOUNTER — Ambulatory Visit: Payer: Medicare Other

## 2019-03-05 ENCOUNTER — Ambulatory Visit
Admission: RE | Admit: 2019-03-05 | Discharge: 2019-03-05 | Disposition: A | Discharge status: Home / Self Care | Payer: Medicare Other | Source: Ambulatory Visit | Attending: Radiation Oncology | Admitting: Radiation Oncology

## 2019-03-05 ENCOUNTER — Other Ambulatory Visit: Payer: Self-pay

## 2019-03-05 ENCOUNTER — Encounter: Payer: Self-pay | Admitting: Speech Pathology

## 2019-03-05 DIAGNOSIS — C3412 Malignant neoplasm of upper lobe, left bronchus or lung: Secondary | ICD-10-CM | POA: Diagnosis not present

## 2019-03-05 NOTE — Therapy (Signed)
[place] MAIN [facility] SERVICES [address] [place], [place], [address] Phone: [phone]   Fax:  [phone]  Speech Language Pathology Treatment  Patient Details  Name: Jennifer Hodges MRN: [id number] Date of Birth: 1945-11-29 Referring Provider (SLP): Dr. Pryor Ochoa   Encounter Date: 03/04/2019  End of Session - 03/05/19 1201    Visit Number  3    Number of Visits  17    Date for SLP Re-Evaluation  04/12/19    Authorization Type  Medicare    Authorization Time Period  Start 02/14/2019    Authorization - Visit Number  3    Authorization - Number of Visits  10    SLP Start Time  1400    SLP Stop Time   1450    SLP Time Calculation (min)  50 min    Activity Tolerance  Patient tolerated treatment well       Past Medical History:  Diagnosis Date  . Anemia   . Asthma    No Inhalers--Dr. [name] will order as needed  . Bronchiectasis (HCC)    mild  . Chronic headaches     followed by [facility]ache [facility] migraines  . COPD (chronic obstructive pulmonary disease) ([place])   . DDD (degenerative disc disease), lumbar   . Diabetes mellitus without complication [facility])    "doctor says I no longer have diabetes"    . Diverticulosis   . Family history of adverse reaction to anesthesia    PONV  . Gall stones    history of  . GERD (gastroesophageal reflux disease)    EGD 8/09- non bleeding erosive gastritis, documentd esophageal ulcerations.   . Hiatal hernia   . History of pneumonia   . Hypercholesterolemia   . IBS (irritable bowel syndrome)   . Malignant neoplasm of upper lobe of left lung ([place]) 12/27/2018  . Murmur   . Osteoarthritis    lumbar disc disease, left hip  . PONV (postoperative nausea and vomiting)    "Only with last hip and I believe it was due to the morphine"   . Sleep apnea    cpap asked to bring mask and tubing  . Weakness of right side of body     Past Surgical History:  Procedure Laterality Date  . ABDOMINAL HYSTERECTOMY   age 67  . ANTERIOR CERVICAL DECOMP/DISCECTOMY FUSION N/A 02/24/2015   Procedure: CERVICAL FOUR-FIVE, CERVICAL FIVE-SIX, CERVICAL SIX-SEVEN ANTERIOR CERVICAL DECOMPRESSION/DISCECTOMY FUSION ;  Surgeon: [name], MD;  Location: [place] [facility];  Service: Neurosurgery;  Laterality: N/A;  C45 C56 C67 anterior cervical decompression with fusion interbody prosthesis plating and bonegraft  . APPENDECTOMY    . BACK SURGERY     4th lumbar fusion  . BLADDER SURGERY N/A    with vaginal wall repair  . BREAST CYST ASPIRATION Bilateral    neg  . BREAST SURGERY Bilateral    cyst removed and reduction  . CARDIAC CATHETERIZATION  2014  . CARPAL TUNNEL RELEASE Right 02/11/2016   Procedure: CARPAL TUNNEL RELEASE;  Surgeon: [name], MD;  Location: [facility] ORS;  Service: Orthopedics;  Laterality: Right;  . CATARACT EXTRACTION W/ INTRAOCULAR LENS IMPLANT Bilateral 2015  . CHOLECYSTECTOMY    . EUS N/A 05/31/2012   Procedure: UPPER ENDOSCOPIC ULTRASOUND (EUS) LINEAR;  Surgeon: [name], MD;  [facility]: [facility];  Service: Endoscopy;  Laterality: N/A;  . EXCISIONAL HEMORRHOIDECTOMY    . JOINT REPLACEMENT Bilateral   . KNEE ARTHROSCOPY WITH LATERAL MENISECTOMY Right  07/07/2015   Procedure: KNEE ARTHROSCOPY WITH LATERAL MENISECTOMY, PARTIAL SYNOVECTOMY;  Surgeon: [name], MD;  Location: [facility] ORS;  Service: Orthopedics;  Laterality: Right;  . LUMBAR LAMINECTOMY    . PORTA CATH INSERTION N/A 01/07/2019   Procedure: PORTA CATH INSERTION;  Surgeon: [name], MD;  Location: [place] CV LAB;  Service: Cardiovascular;  Laterality: N/A;  . REDUCTION MAMMAPLASTY  1990  . RIGHT OOPHORECTOMY    . TOTAL HIP ARTHROPLASTY Left 05/01/2014   Dr. [name]  . TOTAL HIP ARTHROPLASTY Right 08/05/2014   Procedure: TOTAL HIP ARTHROPLASTY ANTERIOR APPROACH;  Surgeon: [name], MD;  Location: [facility] ORS;  Service: Orthopedics;  Laterality: Right;  . ULNAR NERVE TRANSPOSITION Right 02/11/2016    Procedure: ULNAR NERVE DECOMPRESSION/TRANSPOSITION;  Surgeon: [name], MD;  Location: [facility] ORS;  Service: Orthopedics;  Laterality: Right;  Marland Kitchen VIDEO BRONCHOSCOPY WITH ENDOBRONCHIAL ULTRASOUND Left 12/19/2018   Procedure: VIDEO BRONCHOSCOPY WITH ENDOBRONCHIAL ULTRASOUND, LEFT, SLEEP APNEA;  Surgeon: [name], MD;  Location: [facility] ORS;  Service: Thoracic;  Laterality: Left;    There were no vitals filed for this visit.  Subjective Assessment - 03/05/19 1200    Subjective  Patient reports that her sisters say "I sound better"            ADULT SLP TREATMENT - 03/05/19 0001      General Information   Behavior/Cognition  Alert;Cooperative;Pleasant mood    HPI  Jennifer Hodges is 73 year old woman with vocal quality change since May 2020.  The patient has a history of COPD and recent diagnosis of lung cancer, currently undergoing chemoradiation.  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       Treatment Provided   Treatment provided  Cognitive-Linquistic      Pain Assessment   Pain Assessment  No/denies pain      Cognitive-Linquistic Treatment   Treatment focused on  Voice    Skilled Treatment  The patient was provided with written and verbal teaching regarding vocal hygiene.  The patient was provided with written and verbal teaching regarding neck, shoulder, tongue, and throat stretches exercises to promote relaxed phonation. The patient states that talking leaves her exhausted and sore. The patient was provided with written and verbal teaching regarding breath support exercises.  She demonstrates much less clavicular breathing and improved airflow.  Patient instructed in flow phonation through skill level 1: establish airflow release: Unarticulated: significant improvement in duration of airflow.  Articulated (unvoiced): patient demonstrates improving mastery of maintaining airflow while moving articulators.  Initiated skill level 2-  Airflow + Voicing:  Unarticulated:  having best response to voiced trill.  Articulated:  having best response to imitating therapist with meaningful phrases.      Assessment / Recommendations / Plan   Plan  Continue with current plan of care      Progression Toward Goals   Progression toward goals  Progressing toward goals       SLP Education - 03/05/19 1201    Education Details  flow phonation         SLP Long Term Goals - 02/15/19 1411      SLP LONG TERM GOAL #1   Title  The patient will be independent for abdominal breathing and breath support exercises.    Time  8    Period  Weeks    Status  New    Target Date  04/12/19      SLP LONG TERM GOAL #2   Title  The  patient will minimize vocal tension via resonant voice therapy (or comparable technique) with min SLP cues with 80% accuracy.    Time  8    Period  Weeks    Status  New    Target Date  04/12/19      SLP LONG TERM GOAL #3   Title  The patient will maintain relaxed phonation / oral resonance for paragraph length recitation with 80% accuracy.    Time  8    Period  Weeks    Status  New    Target Date  04/12/19       Plan - 03/05/19 1202    Clinical Impression Statement  The patient is responding well to flow phonation therapy.  She has improved her duration for unarticulated airflow and demonstrates improving mastery of maintaining airflow while moving articulators.  The patient has ingrained habit of breath holding while trying to talk, resulting tight hoarse voice and vocal fatigue.  Patient demonstrates emerging mastery of voiced airflow.    Speech Therapy Frequency  2x / week    Duration  Other (comment)    Treatment/Interventions  Patient/family education;Other (comment)    Potential to Achieve Goals  Fair    Potential Considerations  Ability to learn/carryover information;Previous level of function;Co-morbidities;Severity of impairments;Cooperation/participation level;Medical prognosis;Family/community support    SLP Home  Exercise Plan  Provided    Consulted and Agree with Plan of Care  Patient       Patient will benefit from skilled therapeutic intervention in order to improve the following deficits and impairments:   Dysphonia    Problem List Patient Active Problem List   Diagnosis Date Noted  . Benign breast lumps 01/11/2019  . Gout 01/11/2019  . Hives 01/11/2019  . Lung disease 01/11/2019  . Migraine headache 01/11/2019  . Goals of care, counseling/discussion 12/27/2018  . Malignant neoplasm of upper lobe of left lung ([place]) 12/27/2018  . Night sweats 08/07/2016  . 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 Osteopenia of multiple sites 04/27/2016  . Left carpal tunnel syndrome 02/02/2016  . Carotid stenosis, asymptomatic, bilateral 12/29/2015  . Prediabetes 09/28/2015  . Primary osteoarthritis involving multiple joints 09/28/2015  . Knee pain 05/11/2015  . Chest tightness 03/24/2015  . Stool incontinence 03/24/2015  . Urinary frequency 03/04/2015  . Cervical spondylosis with radiculopathy 02/24/2015  . Pre-syncope 02/19/2015  . URI (upper respiratory infection) 02/15/2015  . Neck pain 12/25/2014  . Pelvic pain in female 11/16/2014  . Primary osteoarthritis of hip 08/05/2014  . Heel pain 06/01/2014  . Diarrhea 06/01/2014  . Health care maintenance 06/01/2014  . Right leg pain 03/30/2014  . Right arm pain 03/30/2014  . Sinusitis 03/30/2014  . Internal nasal lesion 11/12/2013  . Other specified disorders of nose and nasal sinuses 11/12/2013  . Chronic tension-type headache, intractable 10/30/2013  . Intractable migraine without aura and without status migrainosus 10/30/2013  . Occipital neuralgia of left side 10/30/2013  . IBS (irritable bowel syndrome) 09/03/2013  . Rib pain 08/04/2013  . Cervical radiculitis 08/01/2013  . Lumbar radiculitis 08/01/2013  . Palpitations 07/25/2013  . Benign essential hypertension 05/28/2013  . Neuralgia, neuritis, and radiculitis, unspecified  05/28/2013  . Vitamin D deficiency 05/28/2013  . Elevated AFP 04/28/2013  . Abnormality of alpha-fetoprotein 04/28/2013  . Osteoporosis 11/12/2012  . Incomplete emptying of bladder 07/11/2012  . Prolapse of vaginal vault after hysterectomy 07/11/2012  . Nonspecific (abnormal) findings on radiological and other examination of gastrointestinal tract 05/31/2012  . Bronchiectasis ([place]) 02/05/2012  .  Sleep apnea 02/05/2012  . Degenerative disc disease, cervical 02/05/2012  . Hypercholesterolemia 02/05/2012  . GERD (gastroesophageal reflux disease) 02/05/2012  . Chronic headaches 02/05/2012   Leroy Sea, MS/CCC- SLP  Lou Miner 03/05/2019, 12:03 PM  [place] MAIN [facility] SERVICES [address] [place], [place], [address] Phone: [phone]   Fax:  [phone]   Name: THARON KITCH MRN: [id number] Date of Birth: 1945-09-21

## 2019-03-06 ENCOUNTER — Ambulatory Visit: Payer: Medicare Other | Admitting: Speech Pathology

## 2019-03-06 ENCOUNTER — Ambulatory Visit
Admission: RE | Admit: 2019-03-06 | Discharge: 2019-03-06 | Disposition: A | Discharge status: Home / Self Care | Payer: Medicare Other | Source: Ambulatory Visit | Attending: Gastroenterology | Admitting: Gastroenterology

## 2019-03-06 ENCOUNTER — Ambulatory Visit
Admission: RE | Admit: 2019-03-06 | Discharge: 2019-03-06 | Disposition: A | Discharge status: Home / Self Care | Payer: Medicare Other | Source: Ambulatory Visit | Attending: Radiation Oncology | Admitting: Radiation Oncology

## 2019-03-06 ENCOUNTER — Other Ambulatory Visit: Payer: Self-pay

## 2019-03-06 DIAGNOSIS — R1310 Dysphagia, unspecified: Secondary | ICD-10-CM | POA: Insufficient documentation

## 2019-03-06 DIAGNOSIS — R490 Dysphonia: Secondary | ICD-10-CM | POA: Diagnosis not present

## 2019-03-06 DIAGNOSIS — C3412 Malignant neoplasm of upper lobe, left bronchus or lung: Secondary | ICD-10-CM | POA: Diagnosis not present

## 2019-03-07 ENCOUNTER — Encounter: Payer: Self-pay | Admitting: Speech Pathology

## 2019-03-07 ENCOUNTER — Ambulatory Visit: Payer: Medicare Other

## 2019-03-07 NOTE — Therapy (Signed)
[place] MAIN [facility] SERVICES [address] [place], [place], [address] Phone: [phone]   Fax:  [phone]  Speech Language Pathology Treatment  Patient Details  Name: Jennifer Hodges MRN: [id number] Date of Birth: [birth year] Referring Provider (SLP): Dr. Pryor Ochoa   Encounter Date: 03/06/2019  End of Session - 03/07/19 0823    Visit Number  4    Number of Visits  17    Date for SLP Re-Evaluation  04/12/19    Authorization Type  Medicare    Authorization Time Period  Start 02/14/2019    Authorization - Visit Number  4    Authorization - Number of Visits  10    SLP Start Time  1400    SLP Stop Time   1450    SLP Time Calculation (min)  50 min    Activity Tolerance  Patient tolerated treatment well       Past Medical History:  Diagnosis Date  . Anemia   . Asthma    No Inhalers--Dr. [name] will order as needed  . Bronchiectasis (HCC)    mild  . Chronic headaches     followed by Headache [facility] migraines  . COPD (chronic obstructive pulmonary disease) ([place])   . DDD (degenerative disc disease), lumbar   . Diabetes mellitus without complication [facility])    "doctor says I no longer have diabetes"    . Diverticulosis   . Family history of adverse reaction to anesthesia    PONV  . Gall stones    history of  . GERD (gastroesophageal reflux disease)    EGD 8/09- non bleeding erosive gastritis, documentd esophageal ulcerations.   . Hiatal hernia   . History of pneumonia   . Hypercholesterolemia   . IBS (irritable bowel syndrome)   . Malignant neoplasm of upper lobe of left lung ([place]) 12/27/2018  . Murmur   . Osteoarthritis    lumbar disc disease, left hip  . PONV (postoperative nausea and vomiting)    "Only with last hip and I believe it was due to the morphine"   . Sleep apnea    cpap asked to bring mask and tubing  . Weakness of right side of body     Past Surgical History:  Procedure Laterality Date  . ABDOMINAL HYSTERECTOMY   age 73  . ANTERIOR CERVICAL DECOMP/DISCECTOMY FUSION N/A 02/24/2015   Procedure: CERVICAL FOUR-FIVE, CERVICAL FIVE-SIX, CERVICAL SIX-SEVEN ANTERIOR CERVICAL DECOMPRESSION/DISCECTOMY FUSION ;  Surgeon: [name], MD;  Location: [place] [facility]S;  Service: Neurosurgery;  Laterality: N/A;  C45 C56 C67 anterior cervical decompression with fusion interbody prosthesis plating and bonegraft  . APPENDECTOMY    . BACK SURGERY     4th lumbar fusion  . BLADDER SURGERY N/A    with vaginal wall repair  . BREAST CYST ASPIRATION Bilateral    neg  . BREAST SURGERY Bilateral    cyst removed and reduction  . CARDIAC CATHETERIZATION  2014  . CARPAL TUNNEL RELEASE Right 02/11/2016   Procedure: CARPAL TUNNEL RELEASE;  Surgeon: [name], MD;  Location: [facility] ORS;  Service: Orthopedics;  Laterality: Right;  . CATARACT EXTRACTION W/ INTRAOCULAR LENS IMPLANT Bilateral 2015  . CHOLECYSTECTOMY    . EUS N/A 05/31/2012   Procedure: UPPER ENDOSCOPIC ULTRASOUND (EUS) LINEAR;  Surgeon: [name], MD;  [facility]: [facility];  Service: Endoscopy;  Laterality: N/A;  . EXCISIONAL HEMORRHOIDECTOMY    . JOINT REPLACEMENT Bilateral   . KNEE ARTHROSCOPY WITH LATERAL MENISECTOMY Right  07/07/2015   Procedure: KNEE ARTHROSCOPY WITH LATERAL MENISECTOMY, PARTIAL SYNOVECTOMY;  Surgeon: [name], MD;  Location: [facility] ORS;  Service: Orthopedics;  Laterality: Right;  . LUMBAR LAMINECTOMY    . PORTA CATH INSERTION N/A 01/07/2019   Procedure: PORTA CATH INSERTION;  Surgeon: [name], MD;  Location: [place] CV LAB;  Service: Cardiovascular;  Laterality: N/A;  . REDUCTION MAMMAPLASTY  1990  . RIGHT OOPHORECTOMY    . TOTAL HIP ARTHROPLASTY Left 05/01/2014   Dr. [name]  . TOTAL HIP ARTHROPLASTY Right 08/05/2014   Procedure: TOTAL HIP ARTHROPLASTY ANTERIOR APPROACH;  Surgeon: [name], MD;  Location: [facility]S;  Service: Orthopedics;  Laterality: Right;  . ULNAR NERVE TRANSPOSITION Right 02/11/2016    Procedure: ULNAR NERVE DECOMPRESSION/TRANSPOSITION;  Surgeon: [name], MD;  Location: [facility];  Service: Orthopedics;  Laterality: Right;  Marland Kitchen VIDEO BRONCHOSCOPY WITH ENDOBRONCHIAL ULTRASOUND Left 12/19/2018   Procedure: VIDEO BRONCHOSCOPY WITH ENDOBRONCHIAL ULTRASOUND, LEFT, SLEEP APNEA;  Surgeon: [name], MD;  Location: [facility] ORS;  Service: Thoracic;  Laterality: Left;    There were no vitals filed for this visit.  Subjective Assessment - 03/07/19 0821    Subjective  Patient reports that her sisters say "I sound better"            ADULT SLP TREATMENT - 03/07/19 0001      General Information   Behavior/Cognition  Alert;Cooperative;Pleasant mood    HPI  [name] is 73 year old woman with vocal quality change since May 2020.  The patient has a history of COPD and recent diagnosis of lung cancer, currently undergoing chemoradiation.  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       Treatment Provided   Treatment provided  Cognitive-Linquistic      Pain Assessment   Pain Assessment  No/denies pain      Cognitive-Linquistic Treatment   Treatment focused on  Voice    Skilled Treatment  The patient was provided with written and verbal teaching regarding vocal hygiene.  The patient was provided with written and verbal teaching regarding neck, shoulder, tongue, and throat stretches exercises to promote relaxed phonation. The patient states that talking leaves her exhausted and sore. The patient was provided with written and verbal teaching regarding breath support exercises.  She demonstrates much less clavicular breathing and improved airflow.  Patient instructed in flow phonation through skill level 1: establish airflow release: Unarticulated: significant improvement in duration of airflow.  Articulated (unvoiced): patient demonstrates improving mastery of maintaining airflow while moving articulators.  Initiated skill level 2-  Airflow + Voicing:  Unarticulated:  having best response to voiced trill.  Articulated:  having best response to imitating therapist with meaningful phrases.  Patient instructed in initial steps of resonant voice therapy, doing well with hum and initial /m/ words.      Assessment / Recommendations / Plan   Plan  Continue with current plan of care      Progression Toward Goals   Progression toward goals  Progressing toward goals       SLP Education - 03/07/19 0822    Education Details  flow phonation / resonant voice    Person(s) Educated  Patient    Methods  Explanation;Demonstration;Handout    Comprehension  Verbalized understanding         SLP Long Term Goals - 02/15/19 1411      SLP LONG TERM GOAL #1   Title  The patient will be independent for abdominal breathing and breath support  exercises.    Time  8    Period  Weeks    Status  New    Target Date  04/12/19      SLP LONG TERM GOAL #2   Title  The patient will minimize vocal tension via resonant voice therapy (or comparable technique) with min SLP cues with 80% accuracy.    Time  8    Period  Weeks    Status  New    Target Date  04/12/19      SLP LONG TERM GOAL #3   Title  The patient will maintain relaxed phonation / oral resonance for paragraph length recitation with 80% accuracy.    Time  8    Period  Weeks    Status  New    Target Date  04/12/19       Plan - 03/07/19 1696    Clinical Impression Statement  The patient is responding well to flow phonation therapy and resonant voice therapy.  She has improved her duration for unarticulated airflow and demonstrates improving mastery of maintaining airflow while moving articulators.  The patient has ingrained habit of breath holding while trying to talk, resulting tight hoarse voice and vocal fatigue.  Patient demonstrates emerging mastery of voiced airflow.    Speech Therapy Frequency  2x / week    Duration  Other (comment)    Treatment/Interventions  Patient/family education;Other  (comment)    Potential to Achieve Goals  Fair    Potential Considerations  Ability to learn/carryover information;Previous level of function;Co-morbidities;Severity of impairments;Cooperation/participation level;Medical prognosis;Family/community support    SLP Home Exercise Plan  Provided    Consulted and Agree with Plan of Care  Patient       Patient will benefit from skilled therapeutic intervention in order to improve the following deficits and impairments:   Dysphonia    Problem List Patient Active Problem List   Diagnosis Date Noted  . Benign breast lumps 01/11/2019  . Gout 01/11/2019  . Hives 01/11/2019  . Lung disease 01/11/2019  . Migraine headache 01/11/2019  . Goals of care, counseling/discussion 12/27/2018  . Malignant neoplasm of upper lobe of left lung ([place]) 12/27/2018  . Night sweats 08/07/2016  . 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 Osteopenia of multiple sites 04/27/2016  . Left carpal tunnel syndrome 02/02/2016  . Carotid stenosis, asymptomatic, bilateral 12/29/2015  . Prediabetes 09/28/2015  . Primary osteoarthritis involving multiple joints 09/28/2015  . Knee pain 05/11/2015  . Chest tightness 03/24/2015  . Stool incontinence 03/24/2015  . Urinary frequency 03/04/2015  . Cervical spondylosis with radiculopathy 02/24/2015  . Pre-syncope 02/19/2015  . URI (upper respiratory infection) 02/15/2015  . Neck pain 12/25/2014  . Pelvic pain in female 11/16/2014  . Primary osteoarthritis of hip 08/05/2014  . Heel pain 06/01/2014  . Diarrhea 06/01/2014  . Health care maintenance 06/01/2014  . Right leg pain 03/30/2014  . Right arm pain 03/30/2014  . Sinusitis 03/30/2014  . Internal nasal lesion 11/12/2013  . Other specified disorders of nose and nasal sinuses 11/12/2013  . Chronic tension-type headache, intractable 10/30/2013  . Intractable migraine without aura and without status migrainosus 10/30/2013  . Occipital neuralgia of left side 10/30/2013  .  IBS (irritable bowel syndrome) 09/03/2013  . Rib pain 08/04/2013  . Cervical radiculitis 08/01/2013  . Lumbar radiculitis 08/01/2013  . Palpitations 07/25/2013  . Benign essential hypertension 05/28/2013  . Neuralgia, neuritis, and radiculitis, unspecified 05/28/2013  . Vitamin D deficiency 05/28/2013  . Elevated AFP 04/28/2013  .  Abnormality of alpha-fetoprotein 04/28/2013  . Osteoporosis 11/12/2012  . Incomplete emptying of bladder 07/11/2012  . Prolapse of vaginal vault after hysterectomy 07/11/2012  . Nonspecific (abnormal) findings on radiological and other examination of gastrointestinal tract 05/31/2012  . Bronchiectasis ([place]) 02/05/2012  . Sleep apnea 02/05/2012  . Degenerative disc disease, cervical 02/05/2012  . Hypercholesterolemia 02/05/2012  . GERD (gastroesophageal reflux disease) 02/05/2012  . Chronic headaches 02/05/2012   Leroy Sea, MS/CCC- SLP  Lou Miner 03/07/2019, 8:24 AM  [place] MAIN [facility] SERVICES [address] [place], [place], [address] Phone: [phone]   Fax:  [phone]   Name: STAYSHA TRUBY MRN: [id number] Date of Birth: Aug 06, 1945

## 2019-03-11 ENCOUNTER — Ambulatory Visit: Payer: Medicare Other

## 2019-03-11 ENCOUNTER — Ambulatory Visit
Admission: RE | Admit: 2019-03-11 | Discharge: 2019-03-11 | Disposition: A | Discharge status: Home / Self Care | Payer: Medicare Other | Source: Ambulatory Visit | Attending: Radiation Oncology | Admitting: Radiation Oncology

## 2019-03-11 ENCOUNTER — Other Ambulatory Visit: Payer: Self-pay

## 2019-03-11 DIAGNOSIS — C3412 Malignant neoplasm of upper lobe, left bronchus or lung: Secondary | ICD-10-CM | POA: Diagnosis not present

## 2019-03-12 ENCOUNTER — Other Ambulatory Visit: Payer: Self-pay

## 2019-03-12 ENCOUNTER — Ambulatory Visit
Admission: RE | Admit: 2019-03-12 | Discharge: 2019-03-12 | Disposition: A | Discharge status: Home / Self Care | Payer: Medicare Other | Source: Ambulatory Visit | Attending: Radiation Oncology | Admitting: Radiation Oncology

## 2019-03-12 ENCOUNTER — Encounter: Payer: Self-pay | Admitting: Oncology

## 2019-03-12 ENCOUNTER — Inpatient Hospital Stay (HOSPITAL_BASED_OUTPATIENT_CLINIC_OR_DEPARTMENT_OTHER): Payer: Medicare Other | Admitting: Oncology

## 2019-03-12 ENCOUNTER — Encounter: Payer: Medicare Other | Admitting: Speech Pathology

## 2019-03-12 ENCOUNTER — Inpatient Hospital Stay: Payer: Medicare Other

## 2019-03-12 VITALS — BP 131/80 | HR 70 | Temp 97.1°F | Wt 165.0 lb

## 2019-03-12 DIAGNOSIS — Z95828 Presence of other vascular implants and grafts: Secondary | ICD-10-CM

## 2019-03-12 DIAGNOSIS — C3412 Malignant neoplasm of upper lobe, left bronchus or lung: Secondary | ICD-10-CM

## 2019-03-12 DIAGNOSIS — Z5111 Encounter for antineoplastic chemotherapy: Secondary | ICD-10-CM

## 2019-03-12 DIAGNOSIS — D701 Agranulocytosis secondary to cancer chemotherapy: Secondary | ICD-10-CM | POA: Diagnosis not present

## 2019-03-12 DIAGNOSIS — K208 Other esophagitis without bleeding: Secondary | ICD-10-CM

## 2019-03-12 DIAGNOSIS — T66XXXA Radiation sickness, unspecified, initial encounter: Secondary | ICD-10-CM

## 2019-03-12 DIAGNOSIS — T451X5A Adverse effect of antineoplastic and immunosuppressive drugs, initial encounter: Secondary | ICD-10-CM

## 2019-03-12 LAB — COMPREHENSIVE METABOLIC PANEL
ALT: 17 U/L (ref 0–44)
AST: 17 U/L (ref 15–41)
Albumin: 3.9 g/dL (ref 3.5–5.0)
Alkaline Phosphatase: 77 U/L (ref 38–126)
Anion gap: 7 (ref 5–15)
BUN: 10 mg/dL (ref 8–23)
CO2: 27 mmol/L (ref 22–32)
Calcium: 8.9 mg/dL (ref 8.9–10.3)
Chloride: 107 mmol/L (ref 98–111)
Creatinine, Ser: 0.43 mg/dL — ABNORMAL LOW (ref 0.44–1.00)
GFR calc Af Amer: >60 mL/min (ref >60)
GFR calc non Af Amer: >60 mL/min (ref >60)
Glucose, Bld: 93 mg/dL (ref 70–99)
Potassium: 4.2 mmol/L (ref 3.5–5.1)
Sodium: 141 mmol/L (ref 135–145)
Total Bilirubin: 0.4 mg/dL (ref 0.3–1.2)
Total Protein: 6.4 g/dL — ABNORMAL LOW (ref 6.5–8.1)

## 2019-03-12 LAB — CBC WITH DIFFERENTIAL/PLATELET
Abs Immature Granulocytes: 0.01 10*3/uL (ref 0.00–0.07)
Basophils Absolute: 0 10*3/uL (ref 0.0–0.1)
Basophils Relative: 0 %
Eosinophils Absolute: 0.1 10*3/uL (ref 0.0–0.5)
Eosinophils Relative: 2 %
HCT: 31.4 % — ABNORMAL LOW (ref 36.0–46.0)
Hemoglobin: 10 g/dL — ABNORMAL LOW (ref 12.0–15.0)
Immature Granulocytes: 0 %
Lymphocytes Relative: 21 %
Lymphs Abs: 0.5 10*3/uL — ABNORMAL LOW (ref 0.7–4.0)
MCH: 31.1 pg (ref 26.0–34.0)
MCHC: 31.8 g/dL (ref 30.0–36.0)
MCV: 97.5 fL (ref 80.0–100.0)
Monocytes Absolute: 0.5 10*3/uL (ref 0.1–1.0)
Monocytes Relative: 18 %
Neutro Abs: 1.5 10*3/uL — ABNORMAL LOW (ref 1.7–7.7)
Neutrophils Relative %: 59 %
Platelets: 276 10*3/uL (ref 150–400)
RBC: 3.22 MIL/uL — ABNORMAL LOW (ref 3.87–5.11)
RDW: 18.4 % — ABNORMAL HIGH (ref 11.5–15.5)
WBC: 2.5 10*3/uL — ABNORMAL LOW (ref 4.0–10.5)
nRBC: 0 % (ref 0.0–0.2)

## 2019-03-12 MED ORDER — HEPARIN SOD (PORK) LOCK FLUSH 100 UNIT/ML IV SOLN
500.0000 [IU] | Freq: Once | INTRAVENOUS | Status: AC | PRN
  Administered 2019-03-12: 500 [IU]
  Filled 2019-03-12: qty 5

## 2019-03-12 MED ORDER — SODIUM CHLORIDE 0.9 % IV SOLN
20.0000 mg | Freq: Once | INTRAVENOUS | Status: AC
  Administered 2019-03-12: 12:00:00 20 mg via INTRAVENOUS
  Filled 2019-03-12: qty 2

## 2019-03-12 MED ORDER — PALONOSETRON HCL INJECTION 0.25 MG/5ML
0.2500 mg | Freq: Once | INTRAVENOUS | Status: AC
  Administered 2019-03-12: 0.25 mg via INTRAVENOUS
  Filled 2019-03-12: qty 5

## 2019-03-12 MED ORDER — SODIUM CHLORIDE 0.9% FLUSH
10.0000 mL | Freq: Once | INTRAVENOUS | Status: AC
  Administered 2019-03-12: 10 mL via INTRAVENOUS
  Filled 2019-03-12: qty 10

## 2019-03-12 MED ORDER — SODIUM CHLORIDE 0.9 % IV SOLN
45.0000 mg/m2 | Freq: Once | INTRAVENOUS | Status: AC
  Administered 2019-03-12: 84 mg via INTRAVENOUS
  Filled 2019-03-12: qty 14

## 2019-03-12 MED ORDER — SODIUM CHLORIDE 0.9 % IV SOLN
128.4000 mg | Freq: Once | INTRAVENOUS | Status: AC
  Administered 2019-03-12: 14:00:00 130 mg via INTRAVENOUS
  Filled 2019-03-12: qty 13

## 2019-03-12 MED ORDER — DIPHENHYDRAMINE HCL 50 MG/ML IJ SOLN
50.0000 mg | Freq: Once | INTRAMUSCULAR | Status: AC
  Administered 2019-03-12: 50 mg via INTRAVENOUS
  Filled 2019-03-12: qty 1

## 2019-03-12 MED ORDER — SODIUM CHLORIDE 0.9 % IV SOLN
Freq: Once | INTRAVENOUS | Status: AC
  Filled 2019-03-12: qty 250

## 2019-03-12 MED ORDER — HEPARIN SOD (PORK) LOCK FLUSH 100 UNIT/ML IV SOLN
INTRAVENOUS | Status: AC
  Filled 2019-03-12: qty 5

## 2019-03-12 MED ORDER — FAMOTIDINE IN NACL 20-0.9 MG/50ML-% IV SOLN
20.0000 mg | Freq: Once | INTRAVENOUS | Status: AC
  Administered 2019-03-12: 12:00:00 20 mg via INTRAVENOUS
  Filled 2019-03-12: qty 50

## 2019-03-12 NOTE — Progress Notes (Signed)
Carbo dose decreased from AUC 2 to 1.5

## 2019-03-12 NOTE — Progress Notes (Signed)
Patient stated that she had been having sore throat, therefore, difficult to swallow. Patient stated that she mentioned this to Dr. Baruch Gouty and told her that this was normal due to chemo and radiation.

## 2019-03-14 ENCOUNTER — Ambulatory Visit: Payer: Medicare Other | Admitting: Speech Pathology

## 2019-03-17 NOTE — Progress Notes (Signed)
Hematology/Oncology Consult note [facility]  [phone] [phone]  Patient Care Team: [name], MD as PCP - General (Family Medicine) Telford Nab, RN as Registered Nurse   Name of the patient: Jennifer Hodges  [id number]  07-May-1945   Date of visit: 03/17/19  Diagnosis- Adenocarcinoma of the lung stage III T1b N2 M0  Chief complaint/ Reason for visit-on treatment assessment prior to cycle 7 of weekly carbotaxol chemotherapy concurrent with radiation  Heme/Onc history: patient is a 74 year old female who was seen by pulmonary and ENT for ongoing symptoms of bronchitis and possible laryngitis and received antibiotics for the same.She underwent CT chest in September 2020 which showed multiple bilateral lung nodules with associated mediastinal adenopathy and possible primary left upper lobe lung mass. This was followed by a PET CT scan which showed a left upper lobe lung mass measuring 1.9 x 1.2 cm with an SUV of 10.7. Conglomerate AP window adenopathy measuring 5.8 x 2.1 cm with an SUV of 14. She was also noted to have hypermetabolic left hilar adenopathy and left supraclavicular 0.7 cm lymph node with an SUV of 4.6. Noted to have a 7 mm right lower lobe lung nodule with faint metabolic activity. No evidence of other distant metastatic disease. This was followed by a CT super D chest without contrast. The right lower lobe lung nodules were somewhat larger as compared to September 2020. 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  She was also having symptoms of radiation esophagitis and had a break from radiation treatment for a week.  Today patient does feel better but still continues to have ongoing fatigue.  Reports some 20 swallowing  ECOG PS- 1 Pain scale- 2   Review of systems- Review of Systems  Constitutional: Positive for malaise/fatigue. Negative for chills, fever and weight loss.  HENT: Negative for congestion, ear discharge and nosebleeds.   Eyes: Negative for blurred vision.  Respiratory: Negative for cough, hemoptysis, sputum production, shortness of breath and wheezing.   Cardiovascular: Negative for chest pain, palpitations, orthopnea and claudication.  Gastrointestinal: Negative for abdominal pain, blood in stool, constipation, diarrhea, heartburn, melena, nausea and vomiting.       Difficulty swallowing  Genitourinary: Negative for dysuria, flank pain, frequency, hematuria and urgency.  Musculoskeletal: Negative for back pain, joint pain and myalgias.  Skin: Negative for rash.  Neurological: Negative for dizziness, tingling, focal weakness, seizures, weakness and headaches.  Endo/Heme/Allergies: Does not bruise/bleed easily.  Psychiatric/Behavioral: Negative for depression and suicidal ideas. The patient does not have insomnia.       Allergies  Allergen Reactions  . Aspirin Hives and Other (See Comments)    Difficulty breathing  . Celebrex [Celecoxib] Shortness Of Breath  . Morphine And Related Nausea And Vomiting and Swelling  . Adhesive [Tape] Other (See Comments)    Took top layer of skin off when removed.  . Clarithromycin Nausea And Vomiting  . Codeine Nausea And Vomiting  . Darvon [Propoxyphene Hcl] Nausea And Vomiting  . Demerol [Meperidine] Nausea And Vomiting  . Flonase [Fluticasone Propionate] Other (See Comments)    Fungal  infection  . Simvastatin Other (See Comments)    "caused ulcers in mouth, and fever"  . Talwin  [Pentazocine] Nausea And Vomiting     Past Medical History:  Diagnosis Date  . Anemia   . Asthma    No Inhalers--Dr. [name] will order as needed  . Bronchiectasis (HCC)    mild  . Chronic headaches     followed by [facility] migraines  . COPD (chronic obstructive pulmonary disease) ([place])   . DDD (degenerative disc disease), lumbar   . Diabetes mellitus without complication [facility])    "doctor says I no longer have diabetes"    . Diverticulosis   . Family history of adverse reaction to anesthesia    PONV  . Gall stones    history of  . GERD (gastroesophageal reflux disease)    EGD 8/09- non bleeding erosive gastritis, documentd esophageal ulcerations.   . Hiatal hernia   . History of pneumonia   . Hypercholesterolemia   . IBS (irritable bowel syndrome)   . Malignant neoplasm of upper lobe of left lung ([place]) 12/27/2018  . Murmur   . Osteoarthritis    lumbar disc disease, left hip  . PONV (postoperative nausea and vomiting)    "Only with last hip and I believe it was due to the morphine"   . Sleep apnea    cpap asked to bring mask and tubing  . Weakness of right side of body      Past Surgical History:  Procedure Laterality Date  . ABDOMINAL HYSTERECTOMY  age 59  . ANTERIOR CERVICAL DECOMP/DISCECTOMY FUSION N/A 02/24/2015   Procedure: CERVICAL FOUR-FIVE, CERVICAL FIVE-SIX, CERVICAL SIX-SEVEN ANTERIOR CERVICAL DECOMPRESSION/DISCECTOMY FUSION ;  Surgeon: [name], MD;  Location: [place] [facility];  Service: Neurosurgery;  Laterality: N/A;  C45 C56 C67 anterior cervical decompression with fusion interbody prosthesis plating and bonegraft  . APPENDECTOMY    . BACK SURGERY     4th lumbar fusion  . BLADDER SURGERY N/A    with vaginal wall repair  . BREAST CYST ASPIRATION Bilateral    neg  . BREAST SURGERY Bilateral    cyst removed and reduction  . CARDIAC CATHETERIZATION  2014  . CARPAL TUNNEL RELEASE Right 02/11/2016   Procedure: CARPAL TUNNEL RELEASE;  Surgeon:  [name], MD;  Location: [facility] ORS;  Service: Orthopedics;  Laterality: Right;  . CATARACT EXTRACTION W/ INTRAOCULAR LENS IMPLANT Bilateral 2015  . CHOLECYSTECTOMY    . EUS N/A 05/31/2012   Procedure: UPPER ENDOSCOPIC ULTRASOUND (EUS) LINEAR;  Surgeon: [name], MD;  Location: [facility];  Service: Endoscopy;  Laterality: N/A;  . EXCISIONAL HEMORRHOIDECTOMY    . JOINT REPLACEMENT Bilateral   . KNEE ARTHROSCOPY WITH LATERAL MENISECTOMY Right 07/07/2015   Procedure: KNEE ARTHROSCOPY WITH LATERAL MENISECTOMY, PARTIAL SYNOVECTOMY;  Surgeon: [name], MD;  Location: [facility] ORS;  Service: Orthopedics;  Laterality: Right;  . LUMBAR LAMINECTOMY    . PORTA CATH INSERTION N/A 01/07/2019   Procedure: PORTA CATH INSERTION;  Surgeon: [name], MD;  Location: [place] CV LAB;  Service: Cardiovascular;  Laterality: N/A;  . REDUCTION MAMMAPLASTY  1990  . RIGHT OOPHORECTOMY    . TOTAL HIP ARTHROPLASTY Left 05/01/2014   Dr. [name]  . TOTAL HIP ARTHROPLASTY Right 08/05/2014   Procedure: TOTAL HIP ARTHROPLASTY ANTERIOR APPROACH;  Surgeon: [name], MD;  Location: [facility] ORS;  Service: Orthopedics;  Laterality: Right;  . ULNAR NERVE TRANSPOSITION Right 02/11/2016   Procedure: ULNAR NERVE DECOMPRESSION/TRANSPOSITION;  Surgeon:  [name], MD;  Location: [facility] ORS;  Service: Orthopedics;  Laterality: Right;  Marland Kitchen VIDEO BRONCHOSCOPY WITH ENDOBRONCHIAL ULTRASOUND Left 12/19/2018   Procedure: VIDEO BRONCHOSCOPY WITH ENDOBRONCHIAL ULTRASOUND, LEFT, SLEEP APNEA;  Surgeon: [name], MD;  Location: [facility] ORS;  Service: Thoracic;  Laterality: Left;    Social History   Socioeconomic History  . Marital status: Married    Spouse name: Not on file  . Number of children: Not on file  . Years of education: Not on file  . Highest education level: Not on file  Occupational History  . Not on file  Tobacco Use  . Smoking status: Former Smoker    Quit date: 06/13/2007    Years since  quitting: 11.7  . Smokeless tobacco: Never Used  Substance and Sexual Activity  . Alcohol use: No    Alcohol/week: 0.0 standard drinks  . Drug use: No  . Sexual activity: Never  Other Topics Concern  . Not on file  Social History Narrative  . Not on file   Social Determinants of Health   Financial Resource Strain:   . Difficulty of Paying Living Expenses: Not on file  Food Insecurity:   . Worried About Charity fundraiser in the Last Year: Not on file  . Ran Out of Food in the Last Year: Not on file  Transportation Needs:   . Lack of Transportation (Medical): Not on file  . Lack of Transportation (Non-Medical): Not on file  Physical Activity:   . Days of Exercise per Week: Not on file  . Minutes of Exercise per Session: Not on file  Stress:   . Feeling of Stress : Not on file  Social Connections:   . Frequency of Communication with Friends and Family: Not on file  . Frequency of Social Gatherings with Friends and Family: Not on file  . Attends Religious Services: Not on file  . Active Member of Clubs or Organizations: Not on file  . Attends Archivist Meetings: Not on file  . Marital Status: Not on file  Intimate Partner Violence:   . Fear of Current or Ex-Partner: Not on file  . Emotionally Abused: Not on file  . Physically Abused: Not on file  . Sexually Abused: Not on file    Family History  Problem Relation Age of Onset  . Heart disease Mother        s/p stent  . Hypertension Mother   . Hypercholesterolemia Mother   . Diabetes Father   . Stomach cancer Other        uncle  . Breast cancer Cousin   . Breast cancer Paternal Aunt      Current Outpatient Medications:  .  albuterol (PROVENTIL) (2.5 MG/3ML) 0.083% nebulizer solution, Take 2.5 mg by nebulization every 6 (six) hours as needed for wheezing., Disp: , Rfl:  .  albuterol (VENTOLIN HFA) 108 (90 Base) MCG/ACT inhaler, Inhale 2 puffs into the lungs every 6 (six) hours as needed for wheezing or  shortness of breath., Disp: 18 g, Rfl: 0 .  azithromycin (ZITHROMAX Z-PAK) 250 MG tablet, Follow  Package instructions, Disp: 6 each, Rfl: 0 .  Bioflavonoid Products (ESTER C PO), Take 1 tablet by mouth 3 (three) times daily., Disp: , Rfl:  .  Calcium-Magnesium-Zinc (CAL-MAG-ZINC PO), Take 1 tablet by mouth daily., Disp: , Rfl:  .  cetirizine (ZYRTEC) 5 MG tablet, Take 1 tablet (5 mg total) by mouth daily. (Patient taking differently: Take 5 mg by mouth daily  as needed (runny nose.). ), Disp: 30 tablet, Rfl: 0 .  CHLORASEPTIC MAX SORE THROAT 1.5-33 % LIQD, Use as directed 1 spray in the mouth or throat as needed (throat irritation.)., Disp: , Rfl:  .  Cholecalciferol (VITAMIN D3) 25 MCG (1000 UT) CHEW, Chew 5,000 Units by mouth daily., Disp: , Rfl:  .  Coenzyme Q10 (CO Q 10) 100 MG CAPS, Take 100 mg by mouth daily. , Disp: , Rfl:  .  Cyanocobalamin (VITAMIN B-12) 2500 MCG SUBL, Place 2,500 mcg under the tongue daily., Disp: , Rfl:  .  cyclobenzaprine (FLEXERIL) 10 MG tablet, Take 10 mg by mouth at bedtime. , Disp: , Rfl:  .  dexamethasone (DECADRON) 4 MG tablet, Take 2 tablets (8 mg total) by mouth daily. Start the day after chemotherapy for 2 days., Disp: 30 tablet, Rfl: 1 .  DHEA 25 MG CAPS, Take 25 mg by mouth daily., Disp: , Rfl:  .  ezetimibe (ZETIA) 10 MG tablet, Take 10 mg by mouth at bedtime., Disp: , Rfl:  .  folic acid (FOLVITE) 700 MCG tablet, Take 400 mcg by mouth 2 (two) times daily. , Disp: , Rfl:  .  Garlic 1749 MG CAPS, Take 1,000 mg by mouth daily., Disp: , Rfl:  .  Histamine Dihydrochloride (AUSTRALIAN DREAM ARTHRITIS EX), Apply 1 application topically 4 (four) times daily as needed (pain.)., Disp: , Rfl:  .  HYDROcodone-acetaminophen (NORCO/VICODIN) 5-325 MG tablet, 1-2 tabs po bid prn (Patient taking differently: Take 1 tablet by mouth at bedtime. ), Disp: 10 tablet, Rfl: 0 .  HYDROcodone-homatropine (HYCODAN) 5-1.5 MG/5ML syrup, Take 5 mLs by mouth 3 (three) times daily as  needed for cough., Disp: 120 mL, Rfl: 0 .  ipratropium (ATROVENT) 0.06 % nasal spray, Place 2 sprays into both nostrils 4 (four) times daily as needed for rhinitis., Disp: 15 mL, Rfl: 0 .  lidocaine-prilocaine (EMLA) cream, Apply to affected area once, Disp: 30 g, Rfl: 3 .  LORazepam (ATIVAN) 0.5 MG tablet, Take 1 tablet (0.5 mg total) by mouth every 6 (six) hours as needed (Nausea or vomiting)., Disp: 30 tablet, Rfl: 0 .  magic mouthwash w/lidocaine SOLN, [employer] magic mthwsh  SWISH AND SWALLOW ACUTE OPIOID THERAPY ML BY MOUTH EVERY 6 HOURS AS NEEDED, Disp: , Rfl:  .  Magnesium (V-R MAGNESIUM) 250 MG TABS, Take 1 tablet by mouth daily., Disp: , Rfl:  .  montelukast (SINGULAIR) 10 MG tablet, Take 10 mg by mouth at bedtime. , Disp: , Rfl:  .  Omega-3 Fatty Acids (FISH OIL) 1000 MG CAPS, Take 1 tablet by mouth 1 day or 1 dose., Disp: , Rfl:  .  ondansetron (ZOFRAN) 8 MG tablet, Take 1 tablet (8 mg total) by mouth 2 (two) times daily as needed for refractory nausea / vomiting. Start on day 3 after chemo., Disp: 30 tablet, Rfl: 1 .  ondansetron (ZOFRAN-ODT) 4 MG disintegrating tablet, Take 4 mg by mouth every 8 (eight) hours as needed for nausea/vomiting., Disp: , Rfl:  .  pantoprazole (PROTONIX) 40 MG tablet, Take 1 tablet (40 mg total) by mouth 2 (two) times daily before a meal., Disp: 60 tablet, Rfl: 1 .  Polyethyl Glycol-Propyl Glycol (SYSTANE) 0.4-0.3 % SOLN, Place 1 drop into both eyes 5 (five) times daily as needed (dry/irritated eyes). , Disp: , Rfl:  .  prochlorperazine (COMPAZINE) 10 MG tablet, TAKE 1 TABLET(10 MG) BY MOUTH EVERY 6 HOURS AS NEEDED FOR NAUSEA OR VOMITING, Disp: 30 tablet, Rfl: 1 .  pyridOXINE (VITAMIN B-6) 100 MG tablet, Take 100 mg by mouth daily., Disp: , Rfl:  .  sucralfate (CARAFATE) 1 g tablet, 1 tablet daily., Disp: , Rfl:  .  umeclidinium-vilanterol (ANORO ELLIPTA) 62.5-25 MCG/INH AEPB, Inhale into the lungs., Disp: , Rfl:  .  vitamin E 400 UNIT capsule, Take 400 Units by  mouth daily., Disp: , Rfl:  .  Zoledronic Acid (RECLAST IV), Inject 1 Dose into the vein as directed. ONCE A YEAR, Disp: , Rfl:   Physical exam:  Vitals:   03/12/19 1034  BP: 131/80  Pulse: 70  Temp: (!) 97.1 F (36.2 C)  TempSrc: Tympanic  Weight: 165 lb (74.8 kg)   Physical Exam HENT:     Head: Normocephalic and atraumatic.     Mouth/Throat:     Mouth: Mucous membranes are moist.     Pharynx: Oropharynx is clear.  Eyes:     Pupils: Pupils are equal, round, and reactive to light.  Cardiovascular:     Rate and Rhythm: Normal rate and regular rhythm.     Heart sounds: Normal heart sounds.  Pulmonary:     Effort: Pulmonary effort is normal.     Breath sounds: Normal breath sounds.  Abdominal:     General: Bowel sounds are normal.     Palpations: Abdomen is soft.  Musculoskeletal:     Cervical back: Normal range of motion.  Skin:    General: Skin is warm and dry.  Neurological:     Mental Status: She is alert and oriented to person, place, and time.      CMP Latest Ref Rng & Units 03/12/2019  Glucose 70 - 99 mg/dL 93  BUN 8 - 23 mg/dL 10  Creatinine 0.44 - 1.00 mg/dL 0.43(L)  Sodium 135 - 145 mmol/L 141  Potassium 3.5 - 5.1 mmol/L 4.2  Chloride 98 - 111 mmol/L 107  CO2 22 - 32 mmol/L 27  Calcium 8.9 - 10.3 mg/dL 8.9  Total Protein 6.5 - 8.1 g/dL 6.4(L)  Total Bilirubin 0.3 - 1.2 mg/dL 0.4  Alkaline Phos 38 - 126 U/L 77  AST 15 - 41 U/L 17  ALT 0 - 44 U/L 17   CBC Latest Ref Rng & Units 03/12/2019  WBC 4.0 - 10.5 K/uL 2.5(L)  Hemoglobin 12.0 - 15.0 g/dL 10.0(L)  Hematocrit 36.0 - 46.0 % 31.4(L)  Platelets 150 - 400 K/uL 276    No images are attached to the encounter.  DG ESOPHAGUS W DOUBLE CM (HD)  Result Date: 03/06/2019 CLINICAL DATA:  Dysphagia. EXAM: ESOPHOGRAM/BARIUM SWALLOW TECHNIQUE: Combined double contrast and single contrast examination performed using effervescent crystals, thick barium liquid, and thin barium liquid. FLUOROSCOPY TIME:   Fluoroscopy Time:  3 minutes 6 seconds Radiation Exposure Index (if provided by the fluoroscopic device): 102.5 Number of Acquired Spot Images: 38 COMPARISON:  CT 01/21/2019 in 12/19/2018. FINDINGS: PowerPort catheter noted. Prior cervical spine fusion. Cervical and thoracic esophagus is widely patent. No aspiration. Peristalsis normal. Small sliding hiatal hernia. No reflux. Barium tablet not administered as the patient became slightly nauseous. IMPRESSION: No significant abnormality identified. Electronically Signed   By: Marcello Moores  Register   On: 03/06/2019 08:45     Assessment and plan- Patient is a 74 y.o. female with stage III adenocarcinoma of the lung here for on treatment assessment prior to cycle 7 of weekly carbotaxol chemotherapy   1.  Patient has mild leukopenia with a white count of 2.5 and ANC of 1.5.  She will proceed  with cycle 7 of weekly carbotaxol chemotherapy today which will be her last cycle.  Her counts should improve after chemotherapy is completed.  2.  We will get CT chest abdomen and pelvis with contrast in 2 weeks time and I will see her thereafter to discuss results CT scan start first cycle of maintenance durvalumab  3.  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  Continue to monitor   Visit Diagnosis 1. Malignant neoplasm of upper lobe of left lung ([place])   2. Encounter for antineoplastic chemotherapy   3. Radiation esophagitis      Dr. Randa Evens, MD, MPH [facility] [phone] 03/17/2019 8:03 AM

## 2019-03-18 ENCOUNTER — Telehealth: Payer: Self-pay | Admitting: *Deleted

## 2019-03-18 NOTE — Telephone Encounter (Signed)
Patient called and states tha there MyChart says she needs to have a COVID test done, but no one had discussed this with her. I do not see any documentation regarding this nor any order for it. Please return her call if she is to have COVID testing done

## 2019-03-18 NOTE — Telephone Encounter (Signed)
Called patient back to ask her why and who requested a COVID-19 test on her. Patient stated that she had no idea but that her husband had received a notification for her to get tested. However, Dr. Rao did not request one. Patient stated that her PCP did not request it either since she gave them a call and ask. I told her that what I saw upcoming in her chart was a CT Scan for tomorrow and that she needed to pick up her prep kit at the [place] center since she lives near by. Patient agreed and had no further questions. 

## 2019-03-19 ENCOUNTER — Ambulatory Visit: Admission: RE | Admit: 2019-03-19 | Payer: Medicare Other | Source: Ambulatory Visit

## 2019-03-21 ENCOUNTER — Inpatient Hospital Stay: Payer: Medicare Other | Admitting: Oncology

## 2019-03-21 ENCOUNTER — Inpatient Hospital Stay: Payer: Medicare Other

## 2019-03-21 ENCOUNTER — Encounter: Payer: Self-pay | Admitting: Speech Pathology

## 2019-03-21 ENCOUNTER — Other Ambulatory Visit: Payer: Self-pay

## 2019-03-21 ENCOUNTER — Ambulatory Visit: Payer: Medicare Other | Attending: Otolaryngology | Admitting: Speech Pathology

## 2019-03-21 DIAGNOSIS — R490 Dysphonia: Secondary | ICD-10-CM

## 2019-03-21 NOTE — Therapy (Signed)
[place] MAIN [facility] SERVICES [address] [place], [place], [address] Phone: [phone]   Fax:  [phone]  Speech Language Pathology Treatment  Patient Details  Name: Jennifer Hodges MRN: [id number] Date of Birth: Jun 05, 1945 Referring Provider (SLP): Dr. Pryor Ochoa   Encounter Date: 03/21/2019  End of Session - 03/21/19 1606    Visit Number  5    Number of Visits  17    Date for SLP Re-Evaluation  04/12/19    Authorization Type  Medicare    Authorization Time Period  Start 02/14/2019    Authorization - Visit Number  5    Authorization - Number of Visits  10    SLP Start Time  1400    SLP Stop Time   1450    SLP Time Calculation (min)  50 min    Activity Tolerance  Patient tolerated treatment well       Past Medical History:  Diagnosis Date  . Anemia   . Asthma    No Inhalers--Dr. [name] will order as needed  . Bronchiectasis (HCC)    mild  . Chronic headaches     followed by [facility] migraines  . COPD (chronic obstructive pulmonary disease) ([place])   . DDD (degenerative disc disease), lumbar   . Diabetes mellitus without complication [facility])    "doctor says I no longer have diabetes"    . Diverticulosis   . Family history of adverse reaction to anesthesia    PONV  . Gall stones    history of  . GERD (gastroesophageal reflux disease)    EGD 8/09- non bleeding erosive gastritis, documentd esophageal ulcerations.   . Hiatal hernia   . History of pneumonia   . Hypercholesterolemia   . IBS (irritable bowel syndrome)   . Malignant neoplasm of upper lobe of left lung ([place]) 12/27/2018  . Murmur   . Osteoarthritis    lumbar disc disease, left hip  . PONV (postoperative nausea and vomiting)    "Only with last hip and I believe it was due to the morphine"   . Sleep apnea    cpap asked to bring mask and tubing  . Weakness of right side of body     Past Surgical History:  Procedure Laterality Date  . ABDOMINAL HYSTERECTOMY   age 42  . ANTERIOR CERVICAL DECOMP/DISCECTOMY FUSION N/A 02/24/2015   Procedure: CERVICAL FOUR-FIVE, CERVICAL FIVE-SIX, CERVICAL SIX-SEVEN ANTERIOR CERVICAL DECOMPRESSION/DISCECTOMY FUSION ;  Surgeon: [name], MD;  Location: [place] NEURO ORS;  Service: Neurosurgery;  Laterality: N/A;  C45 C56 C67 anterior cervical decompression with fusion interbody prosthesis plating and bonegraft  . APPENDECTOMY    . BACK SURGERY     4th lumbar fusion  . BLADDER SURGERY N/A    with vaginal wall repair  . BREAST CYST ASPIRATION Bilateral    neg  . BREAST SURGERY Bilateral    cyst removed and reduction  . CARDIAC CATHETERIZATION  2014  . CARPAL TUNNEL RELEASE Right 02/11/2016   Procedure: CARPAL TUNNEL RELEASE;  Surgeon: [name], MD;  Location: [facility] ORS;  Service: Orthopedics;  Laterality: Right;  . CATARACT EXTRACTION W/ INTRAOCULAR LENS IMPLANT Bilateral 2015  . CHOLECYSTECTOMY    . EUS N/A 05/31/2012   Procedure: UPPER ENDOSCOPIC ULTRASOUND (EUS) LINEAR;  Surgeon: [name], MD;  Location: [facility] ENDOSCOPY;  Service: Endoscopy;  Laterality: N/A;  . EXCISIONAL HEMORRHOIDECTOMY    . JOINT REPLACEMENT Bilateral   . KNEE ARTHROSCOPY WITH LATERAL MENISECTOMY Right  07/07/2015   Procedure: KNEE ARTHROSCOPY WITH LATERAL MENISECTOMY, PARTIAL SYNOVECTOMY;  Surgeon: [name], MD;  Location: [facility] ORS;  Service: Orthopedics;  Laterality: Right;  . LUMBAR LAMINECTOMY    . PORTA CATH INSERTION N/A 01/07/2019   Procedure: PORTA CATH INSERTION;  Surgeon: [name], MD;  Location: [place] CV LAB;  Service: Cardiovascular;  Laterality: N/A;  . REDUCTION MAMMAPLASTY  1990  . RIGHT OOPHORECTOMY    . TOTAL HIP ARTHROPLASTY Left 05/01/2014   Dr. [name]  . TOTAL HIP ARTHROPLASTY Right 08/05/2014   Procedure: TOTAL HIP ARTHROPLASTY ANTERIOR APPROACH;  Surgeon: [name], MD;  Location: [facility] ORS;  Service: Orthopedics;  Laterality: Right;  . ULNAR NERVE TRANSPOSITION Right 02/11/2016    Procedure: ULNAR NERVE DECOMPRESSION/TRANSPOSITION;  Surgeon: [name], MD;  Location: [facility] ORS;  Service: Orthopedics;  Laterality: Right;  Marland Kitchen VIDEO BRONCHOSCOPY WITH ENDOBRONCHIAL ULTRASOUND Left 12/19/2018   Procedure: VIDEO BRONCHOSCOPY WITH ENDOBRONCHIAL ULTRASOUND, LEFT, SLEEP APNEA;  Surgeon: [name], MD;  Location: [facility] ORS;  Service: Thoracic;  Laterality: Left;    There were no vitals filed for this visit.  Subjective Assessment - 03/21/19 1605    Subjective  Patient reports that her sisters say "I sound better"            ADULT SLP TREATMENT - 03/21/19 0001      General Information   Behavior/Cognition  Alert;Cooperative;Pleasant mood    HPI  Jennifer Hodges is 74 year old woman with vocal quality change since May 2020.  The patient has a history of COPD and recent diagnosis of lung cancer, currently undergoing chemoradiation.  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       Treatment Provided   Treatment provided  Cognitive-Linquistic      Pain Assessment   Pain Assessment  No/denies pain      Cognitive-Linquistic Treatment   Treatment focused on  Voice    Skilled Treatment  The patient was provided with written and verbal teaching regarding vocal hygiene.  The patient was provided with written and verbal teaching regarding neck, shoulder, tongue, and throat stretches exercises to promote relaxed phonation. The patient states that talking leaves her exhausted and sore. The patient was provided with written and verbal teaching regarding breath support exercises.  She demonstrates much less clavicular breathing and improved airflow.  Patient instructed in flow phonation through skill level 1: establish airflow release: Unarticulated: significant improvement in duration of airflow.  Articulated (unvoiced): patient demonstrates improving mastery of maintaining airflow while moving articulators.  Initiated skill level 2-  Airflow + Voicing:  Unarticulated:  having best response to voiced trill.  Articulated:  having best response to imitating therapist with meaningful phrases.  Patient instructed in initial steps of resonant voice therapy, doing well with hum and initial /m/ words.  Patient able to generate clear vocal quality with "oo" and in imitation of phrases and sentences; partial success in spontaneous setting.      Assessment / Recommendations / Plan   Plan  Continue with current plan of care      Progression Toward Goals   Progression toward goals  Progressing toward goals       SLP Education - 03/21/19 1606    Education Details  flow phonation/resonant voice    Person(s) Educated  Patient    Methods  Explanation    Comprehension  Verbalized understanding         SLP Long Term Goals - 02/15/19 1411  SLP LONG TERM GOAL #1   Title  The patient will be independent for abdominal breathing and breath support exercises.    Time  8    Period  Weeks    Status  New    Target Date  04/12/19      SLP LONG TERM GOAL #2   Title  The patient will minimize vocal tension via resonant voice therapy (or comparable technique) with min SLP cues with 80% accuracy.    Time  8    Period  Weeks    Status  New    Target Date  04/12/19      SLP LONG TERM GOAL #3   Title  The patient will maintain relaxed phonation / oral resonance for paragraph length recitation with 80% accuracy.    Time  8    Period  Weeks    Status  New    Target Date  04/12/19       Plan - 03/21/19 1607    Clinical Impression Statement  The patient is responding well to flow phonation therapy and resonant voice therapy.  She has improved her duration for unarticulated airflow and demonstrates improving mastery of maintaining airflow while moving articulators.  The patient has ingrained habit of breath holding while trying to talk, resulting tight hoarse voice and vocal fatigue.  Patient demonstrates improving mastery of voiced airflow.    Speech  Therapy Frequency  2x / week    Duration  Other (comment)    Treatment/Interventions  Patient/family education;Other (comment)    Potential to Achieve Goals  Fair    Potential Considerations  Ability to learn/carryover information;Previous level of function;Co-morbidities;Severity of impairments;Cooperation/participation level;Medical prognosis;Family/community support    SLP Home Exercise Plan  Provided    Consulted and Agree with Plan of Care  Patient       Patient will benefit from skilled therapeutic intervention in order to improve the following deficits and impairments:   Dysphonia    Problem List Patient Active Problem List   Diagnosis Date Noted  . Benign breast lumps 01/11/2019  . Gout 01/11/2019  . Hives 01/11/2019  . Lung disease 01/11/2019  . Migraine headache 01/11/2019  . Goals of care, counseling/discussion 12/27/2018  . Malignant neoplasm of upper lobe of left lung ([place]) 12/27/2018  . Night sweats 08/07/2016  . 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 Osteopenia of multiple sites 04/27/2016  . Left carpal tunnel syndrome 02/02/2016  . Carotid stenosis, asymptomatic, bilateral 12/29/2015  . Prediabetes 09/28/2015  . Primary osteoarthritis involving multiple joints 09/28/2015  . Knee pain 05/11/2015  . Chest tightness 03/24/2015  . Stool incontinence 03/24/2015  . Urinary frequency 03/04/2015  . Cervical spondylosis with radiculopathy 02/24/2015  . Pre-syncope 02/19/2015  . URI (upper respiratory infection) 02/15/2015  . Neck pain 12/25/2014  . Pelvic pain in female 11/16/2014  . Primary osteoarthritis of hip 08/05/2014  . Heel pain 06/01/2014  . Diarrhea 06/01/2014  . Health care maintenance 06/01/2014  . Right leg pain 03/30/2014  . Right arm pain 03/30/2014  . Sinusitis 03/30/2014  . Internal nasal lesion 11/12/2013  . Other specified disorders of nose and nasal sinuses 11/12/2013  . Chronic tension-type headache, intractable 10/30/2013  .  Intractable migraine without aura and without status migrainosus 10/30/2013  . Occipital neuralgia of left side 10/30/2013  . IBS (irritable bowel syndrome) 09/03/2013  . Rib pain 08/04/2013  . Cervical radiculitis 08/01/2013  . Lumbar radiculitis 08/01/2013  . Palpitations 07/25/2013  . Benign essential hypertension 05/28/2013  .  Neuralgia, neuritis, and radiculitis, unspecified 05/28/2013  . Vitamin D deficiency 05/28/2013  . Elevated AFP 04/28/2013  . Abnormality of alpha-fetoprotein 04/28/2013  . Osteoporosis 11/12/2012  . Incomplete emptying of bladder 07/11/2012  . Prolapse of vaginal vault after hysterectomy 07/11/2012  . Nonspecific (abnormal) findings on radiological and other examination of gastrointestinal tract 05/31/2012  . Bronchiectasis ([place]) 02/05/2012  . Sleep apnea 02/05/2012  . Degenerative disc disease, cervical 02/05/2012  . Hypercholesterolemia 02/05/2012  . GERD (gastroesophageal reflux disease) 02/05/2012  . Chronic headaches 02/05/2012   Leroy Sea, MS/CCC- SLP  Lou Miner 03/21/2019, 4:07 PM  [place] MAIN [facility] SERVICES [address] [place], [place], [address] Phone: [phone]   Fax:  [phone]   Name: Jennifer Hodges MRN: [id number] Date of Birth: 1945/08/20

## 2019-03-22 ENCOUNTER — Other Ambulatory Visit: Payer: Self-pay

## 2019-03-22 ENCOUNTER — Other Ambulatory Visit: Payer: Self-pay | Admitting: Oncology

## 2019-03-22 ENCOUNTER — Ambulatory Visit
Admission: RE | Admit: 2019-03-22 | Discharge: 2019-03-22 | Disposition: A | Discharge status: Home / Self Care | Payer: Medicare Other | Source: Ambulatory Visit | Attending: Oncology | Admitting: Oncology

## 2019-03-22 DIAGNOSIS — C3412 Malignant neoplasm of upper lobe, left bronchus or lung: Secondary | ICD-10-CM | POA: Insufficient documentation

## 2019-03-22 MED ORDER — IOHEXOL 300 MG/ML  SOLN
100.0000 mL | Freq: Once | INTRAMUSCULAR | Status: AC | PRN
  Administered 2019-03-22: 10:00:00 100 mL via INTRAVENOUS

## 2019-03-22 NOTE — Progress Notes (Signed)
Patient is coming in for follow up, she mentions she appetite is not all that great but she still eats. She mentions having trouble coughing she is using her nebulizer says she feels like she is trying to cough up glue and has nasal drainage.

## 2019-03-22 NOTE — Progress Notes (Signed)
DISCONTINUE ON PATHWAY REGIMEN - Non-Small Cell Lung     Administer weekly:     Paclitaxel      Carboplatin   **Always confirm dose/schedule in your pharmacy ordering system**  REASON: Continuation Of Treatment PRIOR TREATMENT: [id number]: Carboplatin AUC=2 + Paclitaxel 45 mg/m2 Weekly During Radiation TREATMENT RESPONSE: Partial Response (PR)  START ON PATHWAY REGIMEN - Non-Small Cell Lung     A cycle is every 14 days:     Durvalumab   **Always confirm dose/schedule in your pharmacy ordering system**  Patient Characteristics: Stage III - Unresectable, PS = 0, 1 AJCC T Category: T1b Current Disease Status: No Distant Mets or Local Recurrence AJCC N Category: N2 AJCC M Category: M0 AJCC 8 Stage Grouping: IIIA ECOG Performance Status: 1 Intent of Therapy: Curative Intent, Discussed with Patient

## 2019-03-25 ENCOUNTER — Inpatient Hospital Stay (HOSPITAL_BASED_OUTPATIENT_CLINIC_OR_DEPARTMENT_OTHER): Payer: Medicare Other | Admitting: Oncology

## 2019-03-25 ENCOUNTER — Inpatient Hospital Stay: Payer: Medicare Other

## 2019-03-25 ENCOUNTER — Other Ambulatory Visit: Payer: Self-pay

## 2019-03-25 ENCOUNTER — Inpatient Hospital Stay: Payer: Medicare Other | Attending: Oncology

## 2019-03-25 VITALS — BP 116/51 | HR 81 | Temp 98.1°F | Resp 16 | Wt 157.0 lb

## 2019-03-25 DIAGNOSIS — C3412 Malignant neoplasm of upper lobe, left bronchus or lung: Secondary | ICD-10-CM

## 2019-03-25 DIAGNOSIS — Z7189 Other specified counseling: Secondary | ICD-10-CM | POA: Diagnosis not present

## 2019-03-25 DIAGNOSIS — Z5112 Encounter for antineoplastic immunotherapy: Secondary | ICD-10-CM | POA: Diagnosis present

## 2019-03-25 DIAGNOSIS — Z95828 Presence of other vascular implants and grafts: Secondary | ICD-10-CM

## 2019-03-25 DIAGNOSIS — Z79899 Other long term (current) drug therapy: Secondary | ICD-10-CM | POA: Insufficient documentation

## 2019-03-25 DIAGNOSIS — Z452 Encounter for adjustment and management of vascular access device: Secondary | ICD-10-CM | POA: Insufficient documentation

## 2019-03-25 LAB — COMPREHENSIVE METABOLIC PANEL
ALT: 15 U/L (ref 0–44)
AST: 16 U/L (ref 15–41)
Albumin: 4 g/dL (ref 3.5–5.0)
Alkaline Phosphatase: 65 U/L (ref 38–126)
Anion gap: 8 (ref 5–15)
BUN: 12 mg/dL (ref 8–23)
CO2: 24 mmol/L (ref 22–32)
Calcium: 8.8 mg/dL — ABNORMAL LOW (ref 8.9–10.3)
Chloride: 105 mmol/L (ref 98–111)
Creatinine, Ser: 0.39 mg/dL — ABNORMAL LOW (ref 0.44–1.00)
GFR calc Af Amer: >60 mL/min (ref >60)
GFR calc non Af Amer: >60 mL/min (ref >60)
Glucose, Bld: 95 mg/dL (ref 70–99)
Potassium: 4 mmol/L (ref 3.5–5.1)
Sodium: 137 mmol/L (ref 135–145)
Total Bilirubin: 0.5 mg/dL (ref 0.3–1.2)
Total Protein: 6.4 g/dL — ABNORMAL LOW (ref 6.5–8.1)

## 2019-03-25 LAB — CBC WITH DIFFERENTIAL/PLATELET
Abs Immature Granulocytes: 0.01 10*3/uL (ref 0.00–0.07)
Basophils Absolute: 0 10*3/uL (ref 0.0–0.1)
Basophils Relative: 1 %
Eosinophils Absolute: 0.1 10*3/uL (ref 0.0–0.5)
Eosinophils Relative: 4 %
HCT: 32.3 % — ABNORMAL LOW (ref 36.0–46.0)
Hemoglobin: 10.3 g/dL — ABNORMAL LOW (ref 12.0–15.0)
Immature Granulocytes: 0 %
Lymphocytes Relative: 18 %
Lymphs Abs: 0.6 10*3/uL — ABNORMAL LOW (ref 0.7–4.0)
MCH: 31.4 pg (ref 26.0–34.0)
MCHC: 31.9 g/dL (ref 30.0–36.0)
MCV: 98.5 fL (ref 80.0–100.0)
Monocytes Absolute: 0.4 10*3/uL (ref 0.1–1.0)
Monocytes Relative: 11 %
Neutro Abs: 2.3 10*3/uL (ref 1.7–7.7)
Neutrophils Relative %: 66 %
Platelets: 235 10*3/uL (ref 150–400)
RBC: 3.28 MIL/uL — ABNORMAL LOW (ref 3.87–5.11)
RDW: 17.8 % — ABNORMAL HIGH (ref 11.5–15.5)
WBC: 3.5 10*3/uL — ABNORMAL LOW (ref 4.0–10.5)
nRBC: 0 % (ref 0.0–0.2)

## 2019-03-25 MED ORDER — SODIUM CHLORIDE 0.9% FLUSH
10.0000 mL | Freq: Once | INTRAVENOUS | Status: AC
  Administered 2019-03-25: 10:00:00 10 mL via INTRAVENOUS
  Filled 2019-03-25: qty 10

## 2019-03-25 MED ORDER — HEPARIN SOD (PORK) LOCK FLUSH 100 UNIT/ML IV SOLN
500.0000 [IU] | Freq: Once | INTRAVENOUS | Status: AC
  Administered 2019-03-25: 500 [IU] via INTRAVENOUS
  Filled 2019-03-25: qty 5

## 2019-03-25 NOTE — Progress Notes (Signed)
Pt states that dr Baruch Gouty said that her lungs are irritated from radiation. She has a lot of wheezing, does cough up clear to light yellow . Makes it hard to sleep.  She has a spot in left groin that she had before in 2019 and has anti fungal cream that helps but needs refill. She thinks she got at urgent care. I suggested that she look and see what med it was and call her PCP for refill

## 2019-03-26 ENCOUNTER — Encounter: Payer: Self-pay | Admitting: Oncology

## 2019-03-26 ENCOUNTER — Other Ambulatory Visit: Payer: Medicare Other

## 2019-03-26 ENCOUNTER — Ambulatory Visit: Payer: Medicare Other

## 2019-03-26 ENCOUNTER — Ambulatory Visit: Payer: Medicare Other | Admitting: Oncology

## 2019-03-26 NOTE — Progress Notes (Signed)
Hematology/Oncology Consult note [facility]  Telephone:([phone] [phone]  Patient Care Team: [name], MD as PCP - General (Family Medicine) Telford Nab, RN as Registered Nurse   Name of the patient: Jennifer Hodges  [id number]  1945/05/22   Date of visit: 03/26/19  Diagnosis- Adenocarcinoma of the lung stage III T1b N2 M0  Chief complaint/ Reason for visit-discuss CT scan results  Heme/Onc history: patient is a 74 year old female who was seen by pulmonary and ENT for ongoing symptoms of bronchitis and possible laryngitis and received antibiotics for the same.She underwent CT chest in September 2020 which showed multiple bilateral lung nodules with associated mediastinal adenopathy and possible primary left upper lobe lung mass. This was followed by a PET CT scan which showed a left upper lobe lung mass measuring 1.9 x 1.2 cm with an SUV of 10.7. Conglomerate AP window adenopathy measuring 5.8 x 2.1 cm with an SUV of 14. She was also noted to have hypermetabolic left hilar adenopathy and left supraclavicular 0.7 cm lymph node with an SUV of 4.6. Noted to have a 7 mm right lower lobe lung nodule with faint metabolic activity. No evidence of other distant metastatic disease. This was followed by a CT super D chest without contrast. The right lower lobe lung nodules were somewhat larger as compared to September 2020. 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  Was also seen by pulmonary today and they are recommending changing her tubing of the CPAP machine to allow for warm air to blow when as cold air seems to trigger more episodes of cough.  She has also been using Mucinex which has been helping her.  Denies any fever.  ECOG PS- 1 Pain scale- 0   Review of systems- Review of Systems  Constitutional: Positive for malaise/fatigue. Negative for chills, fever and weight loss.  HENT: Negative for congestion, ear discharge and nosebleeds.   Eyes: Negative for blurred vision.  Respiratory: Positive for cough. Negative for hemoptysis, sputum production, shortness of breath and wheezing.   Cardiovascular: Negative for chest pain, palpitations, orthopnea and claudication.  Gastrointestinal: Negative for abdominal pain, blood in stool, constipation, diarrhea, heartburn, melena, nausea and vomiting.  Genitourinary: Negative for dysuria, flank pain, frequency, hematuria and urgency.  Musculoskeletal: Negative for back pain, joint pain and myalgias.  Skin: Negative for rash.  Neurological: Negative for dizziness, tingling, focal weakness, seizures, weakness and headaches.  Endo/Heme/Allergies: Does not bruise/bleed easily.  Psychiatric/Behavioral: Negative for depression and suicidal ideas. The patient does not have insomnia.        Allergies  Allergen Reactions  . Aspirin Hives and Other (See Comments)    Difficulty breathing  . Celebrex [Celecoxib] Shortness Of Breath  . Morphine And Related Nausea And Vomiting and Swelling  . Adhesive [Tape] Other (See Comments)    Took top layer of skin off when removed.  . Clarithromycin Nausea And Vomiting  . Codeine Nausea And Vomiting  . Darvon [Propoxyphene Hcl] Nausea And Vomiting  . Demerol [Meperidine] Nausea And Vomiting  . Flonase [Fluticasone Propionate] Other (See Comments)  Fungal infection  . Simvastatin Other (See Comments)    "caused ulcers in mouth, and fever"  .  Talwin [Pentazocine] Nausea And Vomiting     Past Medical History:  Diagnosis Date  . Anemia   . Asthma    No Inhalers--Dr. [name] will order as needed  . Bronchiectasis (HCC)    mild  . Chronic headaches     followed by [facility] migraines  . COPD (chronic obstructive pulmonary disease) ([place])   . DDD (degenerative disc disease), lumbar   . Diabetes mellitus without complication [facility])    "doctor says I no longer have diabetes"    . Diverticulosis   . Family history of adverse reaction to anesthesia    PONV  . Gall stones    history of  . GERD (gastroesophageal reflux disease)    EGD 8/09- non bleeding erosive gastritis, documentd esophageal ulcerations.   . Hiatal hernia   . History of pneumonia   . Hypercholesterolemia   . IBS (irritable bowel syndrome)   . Malignant neoplasm of upper lobe of left lung ([place]) 12/27/2018  . Murmur   . Osteoarthritis    lumbar disc disease, left hip  . PONV (postoperative nausea and vomiting)    "Only with last hip and I believe it was due to the morphine"   . Sleep apnea    cpap asked to bring mask and tubing  . Weakness of right side of body      Past Surgical History:  Procedure Laterality Date  . ABDOMINAL HYSTERECTOMY  age 33  . ANTERIOR CERVICAL DECOMP/DISCECTOMY FUSION N/A 02/24/2015   Procedure: CERVICAL FOUR-FIVE, CERVICAL FIVE-SIX, CERVICAL SIX-SEVEN ANTERIOR CERVICAL DECOMPRESSION/DISCECTOMY FUSION ;  Surgeon: [name], MD;  Location: [facility]S;  Service: Neurosurgery;  Laterality: N/A;  C45 C56 C67 anterior cervical decompression with fusion interbody prosthesis plating and bonegraft  . APPENDECTOMY    . BACK SURGERY     4th lumbar fusion  . BLADDER SURGERY N/A    with vaginal wall repair  . BREAST CYST ASPIRATION Bilateral    neg  . BREAST SURGERY Bilateral    cyst removed and reduction  . CARDIAC CATHETERIZATION  2014  . CARPAL TUNNEL RELEASE Right 02/11/2016   Procedure: CARPAL TUNNEL RELEASE;   Surgeon: [name], MD;  Location: [facility] ORS;  Service: Orthopedics;  Laterality: Right;  . CATARACT EXTRACTION W/ INTRAOCULAR LENS IMPLANT Bilateral 2015  . CHOLECYSTECTOMY    . EUS N/A 05/31/2012   Procedure: UPPER ENDOSCOPIC ULTRASOUND (EUS) LINEAR;  Surgeon: [name], MD;  Location: [facility];  Service: Endoscopy;  Laterality: N/A;  . EXCISIONAL HEMORRHOIDECTOMY    . JOINT REPLACEMENT Bilateral   . KNEE ARTHROSCOPY WITH LATERAL MENISECTOMY Right 07/07/2015   Procedure: KNEE ARTHROSCOPY WITH LATERAL MENISECTOMY, PARTIAL SYNOVECTOMY;  Surgeon: [name], MD;  Location: [facility] ORS;  Service: Orthopedics;  Laterality: Right;  . LUMBAR LAMINECTOMY    . PORTA CATH INSERTION N/A 01/07/2019   Procedure: PORTA CATH INSERTION;  Surgeon: [name], MD;  Location: [facility];  Service: Cardiovascular;  Laterality: N/A;  . REDUCTION MAMMAPLASTY  1990  . RIGHT OOPHORECTOMY    . TOTAL HIP ARTHROPLASTY Left 05/01/2014   Dr. [name]  . TOTAL HIP ARTHROPLASTY Right 08/05/2014   Procedure: TOTAL HIP ARTHROPLASTY ANTERIOR APPROACH;  Surgeon: [name], MD;  Location: [facility] ORS;  Service: Orthopedics;  Laterality: Right;  . ULNAR NERVE TRANSPOSITION Right 02/11/2016   Procedure: ULNAR NERVE DECOMPRESSION/TRANSPOSITION;  Surgeon: [name], MD;  Location: [facility] ORS;  Service: Orthopedics;  Laterality: Right;  Marland Kitchen VIDEO BRONCHOSCOPY WITH ENDOBRONCHIAL ULTRASOUND Left 12/19/2018   Procedure: VIDEO BRONCHOSCOPY WITH ENDOBRONCHIAL ULTRASOUND, LEFT, SLEEP APNEA;  Surgeon: [name], MD;  Location: [facility] ORS;  Service: Thoracic;  Laterality: Left;    Social History   Socioeconomic History  . Marital status: Married    Spouse name: Not on file  . Number of children: Not on file  . Years of education: Not on file  . Highest education level: Not on file  Occupational History  . Not on file  Tobacco Use  . Smoking status: Former Smoker    Quit date: 06/13/2007    Years since  quitting: 11.7  . Smokeless tobacco: Never Used  Substance and Sexual Activity  . Alcohol use: No    Alcohol/week: 0.0 standard drinks  . Drug use: No  . Sexual activity: Never  Other Topics Concern  . Not on file  Social History Narrative  . Not on file   Social Determinants of Health   Financial Resource Strain:   . Difficulty of Paying Living Expenses: Not on file  Food Insecurity:   . Worried About Charity fundraiser in the Last Year: Not on file  . Ran Out of Food in the Last Year: Not on file  Transportation Needs:   . Lack of Transportation (Medical): Not on file  . Lack of Transportation (Non-Medical): Not on file  Physical Activity:   . Days of Exercise per Week: Not on file  . Minutes of Exercise per Session: Not on file  Stress:   . Feeling of Stress : Not on file  Social Connections:   . Frequency of Communication with Friends and Family: Not on file  . Frequency of Social Gatherings with Friends and Family: Not on file  . Attends Religious Services: Not on file  . Active Member of Clubs or Organizations: Not on file  . Attends Archivist Meetings: Not on file  . Marital Status: Not on file  Intimate Partner Violence:   . Fear of Current or Ex-Partner: Not on file  . Emotionally Abused: Not on file  . Physically Abused: Not on file  . Sexually Abused: Not on file    Family History  Problem Relation Age of Onset  . Heart disease Mother        s/p stent  . Hypertension Mother   . Hypercholesterolemia Mother   . Diabetes Father   . Stomach cancer Other        uncle  . Breast cancer Cousin   . Breast cancer Paternal Aunt      Current Outpatient Medications:  .  albuterol (PROVENTIL) (2.5 MG/3ML) 0.083% nebulizer solution, Take 2.5 mg by nebulization every 6 (six) hours as needed for wheezing., Disp: , Rfl:  .  albuterol (VENTOLIN HFA) 108 (90 Base) MCG/ACT inhaler, Inhale 2 puffs into the lungs every 6 (six) hours as needed for wheezing or  shortness of breath., Disp: 18 g, Rfl: 0 .  azithromycin (ZITHROMAX Z-PAK) 250 MG tablet, Follow  Package instructions, Disp: 6 each, Rfl: 0 .  Bioflavonoid Products (ESTER C PO), Take 1 tablet by mouth 3 (three) times daily., Disp: , Rfl:  .  Calcium-Magnesium-Zinc (CAL-MAG-ZINC PO), Take 1 tablet by mouth daily., Disp: , Rfl:  .  cetirizine (ZYRTEC) 5 MG tablet, Take 1 tablet (5 mg total) by mouth daily. (Patient taking differently: Take 5 mg by mouth  daily as needed (runny nose.). ), Disp: 30 tablet, Rfl: 0 .  CHLORASEPTIC MAX SORE THROAT 1.5-33 % LIQD, Use as directed 1 spray in the mouth or throat as needed (throat irritation.)., Disp: , Rfl:  .  Cholecalciferol (VITAMIN D3) 25 MCG (1000 UT) CHEW, Chew 5,000 Units by mouth daily., Disp: , Rfl:  .  Coenzyme Q10 (CO Q 10) 100 MG CAPS, Take 100 mg by mouth daily. , Disp: , Rfl:  .  Cyanocobalamin (VITAMIN B-12) 2500 MCG SUBL, Place 2,500 mcg under the tongue daily., Disp: , Rfl:  .  cyclobenzaprine (FLEXERIL) 10 MG tablet, Take 10 mg by mouth at bedtime. , Disp: , Rfl:  .  DHEA 25 MG CAPS, Take 25 mg by mouth daily., Disp: , Rfl:  .  ezetimibe (ZETIA) 10 MG tablet, Take 10 mg by mouth at bedtime., Disp: , Rfl:  .  folic acid (FOLVITE) 244 MCG tablet, Take 400 mcg by mouth 2 (two) times daily. , Disp: , Rfl:  .  Garlic 0102 MG CAPS, Take 1,000 mg by mouth daily., Disp: , Rfl:  .  Histamine Dihydrochloride (AUSTRALIAN DREAM ARTHRITIS EX), Apply 1 application topically 4 (four) times daily as needed (pain.)., Disp: , Rfl:  .  HYDROcodone-acetaminophen (NORCO/VICODIN) 5-325 MG tablet, 1-2 tabs po bid prn (Patient taking differently: Take 1 tablet by mouth at bedtime. ), Disp: 10 tablet, Rfl: 0 .  HYDROcodone-homatropine (HYCODAN) 5-1.5 MG/5ML syrup, Take 5 mLs by mouth 3 (three) times daily as needed for cough., Disp: 120 mL, Rfl: 0 .  ipratropium (ATROVENT) 0.06 % nasal spray, Place 2 sprays into both nostrils 4 (four) times daily as needed for  rhinitis., Disp: 15 mL, Rfl: 0 .  magic mouthwash w/lidocaine SOLN, [employer] magic mthwsh  SWISH AND SWALLOW ACUTE OPIOID THERAPY ML BY MOUTH EVERY 6 HOURS AS NEEDED, Disp: , Rfl:  .  Magnesium (V-R MAGNESIUM) 250 MG TABS, Take 1 tablet by mouth daily., Disp: , Rfl:  .  montelukast (SINGULAIR) 10 MG tablet, Take 10 mg by mouth at bedtime. , Disp: , Rfl:  .  Omega-3 Fatty Acids (FISH OIL) 1000 MG CAPS, Take 1 tablet by mouth 1 day or 1 dose., Disp: , Rfl:  .  ondansetron (ZOFRAN-ODT) 4 MG disintegrating tablet, Take 4 mg by mouth every 8 (eight) hours as needed for nausea/vomiting., Disp: , Rfl:  .  pantoprazole (PROTONIX) 40 MG tablet, Take 1 tablet (40 mg total) by mouth 2 (two) times daily before a meal., Disp: 60 tablet, Rfl: 1 .  Polyethyl Glycol-Propyl Glycol (SYSTANE) 0.4-0.3 % SOLN, Place 1 drop into both eyes 5 (five) times daily as needed (dry/irritated eyes). , Disp: , Rfl:  .  pyridOXINE (VITAMIN B-6) 100 MG tablet, Take 100 mg by mouth daily., Disp: , Rfl:  .  sucralfate (CARAFATE) 1 g tablet, 1 tablet daily., Disp: , Rfl:  .  umeclidinium-vilanterol (ANORO ELLIPTA) 62.5-25 MCG/INH AEPB, Inhale into the lungs., Disp: , Rfl:  .  vitamin E 400 UNIT capsule, Take 400 Units by mouth daily., Disp: , Rfl:  .  Zoledronic Acid (RECLAST IV), Inject 1 Dose into the vein as directed. ONCE A YEAR, Disp: , Rfl:   Physical exam:  Vitals:   03/25/19 1222  BP: (!) 116/51  Pulse: 81  Resp: 16  Temp: 98.1 F (36.7 C)  TempSrc: Tympanic  Weight: 157 lb (71.2 kg)   Physical Exam Constitutional:      General: She is not in acute distress. HENT:  Head: Normocephalic and atraumatic.  Eyes:     Pupils: Pupils are equal, round, and reactive to light.  Cardiovascular:     Rate and Rhythm: Normal rate and regular rhythm.     Heart sounds: Normal heart sounds.  Pulmonary:     Effort: Pulmonary effort is normal. No respiratory distress.     Breath sounds: Normal breath sounds. No wheezing.    Abdominal:     General: Bowel sounds are normal.     Palpations: Abdomen is soft.  Musculoskeletal:     Cervical back: Normal range of motion.  Skin:    General: Skin is warm and dry.  Neurological:     Mental Status: She is alert and oriented to person, place, and time.      CMP Latest Ref Rng & Units 03/25/2019  Glucose 70 - 99 mg/dL 95  BUN 8 - 23 mg/dL 12  Creatinine 0.44 - 1.00 mg/dL 0.39(L)  Sodium 135 - 145 mmol/L 137  Potassium 3.5 - 5.1 mmol/L 4.0  Chloride 98 - 111 mmol/L 105  CO2 22 - 32 mmol/L 24  Calcium 8.9 - 10.3 mg/dL 8.8(L)  Total Protein 6.5 - 8.1 g/dL 6.4(L)  Total Bilirubin 0.3 - 1.2 mg/dL 0.5  Alkaline Phos 38 - 126 U/L 65  AST 15 - 41 U/L 16  ALT 0 - 44 U/L 15   CBC Latest Ref Rng & Units 03/25/2019  WBC 4.0 - 10.5 K/uL 3.5(L)  Hemoglobin 12.0 - 15.0 g/dL 10.3(L)  Hematocrit 36.0 - 46.0 % 32.3(L)  Platelets 150 - 400 K/uL 235    No images are attached to the encounter.  CT Chest W Contrast  Result Date: 03/22/2019 CLINICAL DATA:  Stage III left-sided lung cancer, treated with radiation therapy and chemotherapy. Pre immunotherapy. Productive cough shortness of breath. COPD. Gastroesophageal reflux disease. EXAM: CT CHEST, ABDOMEN, AND PELVIS WITH CONTRAST TECHNIQUE: Multidetector CT imaging of the chest, abdomen and pelvis was performed following the standard protocol during bolus administration of intravenous contrast. CONTRAST:  132m OMNIPAQUE IOHEXOL 300 MG/ML  SOLN COMPARISON:  Chest CT of 12/19/2018. Most recent abdominopelvic CT of 01/21/2019. FINDINGS: CT CHEST FINDINGS Cardiovascular: Right Port-A-Cath tip high right atrium. Aortic atherosclerosis. Tortuous thoracic aorta. Normal heart size, without pericardial effusion. Multivessel coronary artery atherosclerosis. No central pulmonary embolism, on this non-dedicated study. Mediastinum/Nodes: No supraclavicular adenopathy. Hypoattenuating left thyroid nodule of 8 mm is nonspecific. Low right  paratracheal node of 11 mm on 21/5 103, 1.2 cm on the prior. Nodal versus central left upper lobe lung mass in the region of the AP window measures 4.1 x 2.4 cm on 19/5 103. Compare 4.5 x 2.6 cm on the prior exam (when remeasured). A necrotic left infrahilar node of 1.3 cm is decreased from 1.8 cm on the prior exam (when remeasured). Contrast level in the distal esophagus. Lungs/Pleura: No pleural fluid. Moderate centrilobular emphysema. Lower lobe predominant bronchial wall thickening. Bilateral pulmonary nodules. These are decreased in size. For example: 5 mm right upper lobe pulmonary nodule on 32/504, 7 mm on the prior exam (when remeasured). Anterior left upper lobe pulmonary nodule of 1.6 cm on 49/504, decreased from 1.9 cm on the prior exam (when remeasured). Right lower lobe 5 mm nodule on 99/504, 8 mm on 01/21/2019. Musculoskeletal: No acute osseous abnormality. CT ABDOMEN PELVIS FINDINGS Hepatobiliary: Normal liver. Cholecystectomy. Upper normal common duct caliber at 1.0 cm. Pancreas: Suspect pancreas divisum, with a prominent dorsal duct entering the duodenum on 68/3. No acute inflammation  or duct dilatation. Spleen: Normal in size, without focal abnormality. Adrenals/Urinary Tract: Normal adrenal glands. Too small to characterize lesions in both kidneys. No hydronephrosis. Degraded evaluation of the pelvis, secondary to beam hardening artifact from bilateral hip arthroplasty. Grossly normal urinary bladder. Stomach/Bowel: Normal stomach, without wall thickening. Colonic stool burden suggests constipation. Normal terminal ileum. Normal small bowel. Vascular/Lymphatic: Aortic and branch vessel atherosclerosis. No abdominal and no gross pelvic sidewall adenopathy. Reproductive: Hysterectomy.  No adnexal mass. Other: Moderate pelvic floor laxity. Musculoskeletal: Bilateral hip arthroplasty. Lumbosacral spondylosis with disc bulges including at L5-S1 and L3-4. IMPRESSION: 1. Response to therapy of pulmonary  lesions and thoracic adenopathy. 2. No new or progressive disease. 3. No acute process or evidence of metastatic disease in the abdomen or pelvis. 4. Aortic atherosclerosis (ICD10-I70.0), coronary artery atherosclerosis and emphysema (ICD10-J43.9). 5. Esophageal air fluid level suggests dysmotility or gastroesophageal reflux. 6.  Possible constipation. Electronically Signed   By: Abigail Miyamoto M.D.   On: 03/22/2019 10:24   CT ABDOMEN PELVIS W CONTRAST  Result Date: 03/22/2019 CLINICAL DATA:  Stage III left-sided lung cancer, treated with radiation therapy and chemotherapy. Pre immunotherapy. Productive cough shortness of breath. COPD. Gastroesophageal reflux disease. EXAM: CT CHEST, ABDOMEN, AND PELVIS WITH CONTRAST TECHNIQUE: Multidetector CT imaging of the chest, abdomen and pelvis was performed following the standard protocol during bolus administration of intravenous contrast. CONTRAST:  125m OMNIPAQUE IOHEXOL 300 MG/ML  SOLN COMPARISON:  Chest CT of 12/19/2018. Most recent abdominopelvic CT of 01/21/2019. FINDINGS: CT CHEST FINDINGS Cardiovascular: Right Port-A-Cath tip high right atrium. Aortic atherosclerosis. Tortuous thoracic aorta. Normal heart size, without pericardial effusion. Multivessel coronary artery atherosclerosis. No central pulmonary embolism, on this non-dedicated study. Mediastinum/Nodes: No supraclavicular adenopathy. Hypoattenuating left thyroid nodule of 8 mm is nonspecific. Low right paratracheal node of 11 mm on 21/5 103, 1.2 cm on the prior. Nodal versus central left upper lobe lung mass in the region of the AP window measures 4.1 x 2.4 cm on 19/5 103. Compare 4.5 x 2.6 cm on the prior exam (when remeasured). A necrotic left infrahilar node of 1.3 cm is decreased from 1.8 cm on the prior exam (when remeasured). Contrast level in the distal esophagus. Lungs/Pleura: No pleural fluid. Moderate centrilobular emphysema. Lower lobe predominant bronchial wall thickening. Bilateral pulmonary  nodules. These are decreased in size. For example: 5 mm right upper lobe pulmonary nodule on 32/504, 7 mm on the prior exam (when remeasured). Anterior left upper lobe pulmonary nodule of 1.6 cm on 49/504, decreased from 1.9 cm on the prior exam (when remeasured). Right lower lobe 5 mm nodule on 99/504, 8 mm on 01/21/2019. Musculoskeletal: No acute osseous abnormality. CT ABDOMEN PELVIS FINDINGS Hepatobiliary: Normal liver. Cholecystectomy. Upper normal common duct caliber at 1.0 cm. Pancreas: Suspect pancreas divisum, with a prominent dorsal duct entering the duodenum on 68/3. No acute inflammation or duct dilatation. Spleen: Normal in size, without focal abnormality. Adrenals/Urinary Tract: Normal adrenal glands. Too small to characterize lesions in both kidneys. No hydronephrosis. Degraded evaluation of the pelvis, secondary to beam hardening artifact from bilateral hip arthroplasty. Grossly normal urinary bladder. Stomach/Bowel: Normal stomach, without wall thickening. Colonic stool burden suggests constipation. Normal terminal ileum. Normal small bowel. Vascular/Lymphatic: Aortic and branch vessel atherosclerosis. No abdominal and no gross pelvic sidewall adenopathy. Reproductive: Hysterectomy.  No adnexal mass. Other: Moderate pelvic floor laxity. Musculoskeletal: Bilateral hip arthroplasty. Lumbosacral spondylosis with disc bulges including at L5-S1 and L3-4. IMPRESSION: 1. Response to therapy of pulmonary lesions and thoracic adenopathy. 2.  No new or progressive disease. 3. No acute process or evidence of metastatic disease in the abdomen or pelvis. 4. Aortic atherosclerosis (ICD10-I70.0), coronary artery atherosclerosis and emphysema (ICD10-J43.9). 5. Esophageal air fluid level suggests dysmotility or gastroesophageal reflux. 6.  Possible constipation. Electronically Signed   By: Abigail Miyamoto M.D.   On: 03/22/2019 10:24   DG ESOPHAGUS W DOUBLE CM (HD)  Result Date: 03/06/2019 CLINICAL DATA:  Dysphagia.  EXAM: ESOPHOGRAM/BARIUM SWALLOW TECHNIQUE: Combined double contrast and single contrast examination performed using effervescent crystals, thick barium liquid, and thin barium liquid. FLUOROSCOPY TIME:  Fluoroscopy Time:  3 minutes 6 seconds Radiation Exposure Index (if provided by the fluoroscopic device): 102.5 Number of Acquired Spot Images: 38 COMPARISON:  CT 01/21/2019 in 12/19/2018. FINDINGS: PowerPort catheter noted. Prior cervical spine fusion. Cervical and thoracic esophagus is widely patent. No aspiration. Peristalsis normal. Small sliding hiatal hernia. No reflux. Barium tablet not administered as the patient became slightly nauseous. IMPRESSION: No significant abnormality identified. Electronically Signed   By: Marcello Moores  Register   On: 03/06/2019 08:45     Assessment and plan- Patient is a 74 y.o. female with stage III adenocarcinoma of the lung here to discuss CT scan results and further management  I have reviewed CT chest abdomen pelvis images independently and discussed findings with the patient.  Overall there has been reduction in the size of the primary left lung mass as well as mediastinal and hilar adenopathy.  No new findings of progressive disease.  She has achieved partial response with chemoradiation. I would like to proceed with maintenance durvalumab given every 2 weeks for 1 year at this time.  However given that patient is still having some ongoing side effects from radiation treatment and possible bronchitis I will allow her to recover from this at this time and tentatively see her back in 10 days time to start the first cycle of durvalumab.  Treatment will be given with a curative intent   Visit Diagnosis 1. Malignant neoplasm of upper lobe of left lung ([place])   2. Goals of care, counseling/discussion      Dr. Randa Evens, MD, MPH [facility] [phone] 03/26/2019 1:19 PM

## 2019-03-27 ENCOUNTER — Other Ambulatory Visit: Payer: Self-pay

## 2019-03-27 ENCOUNTER — Ambulatory Visit: Payer: Medicare Other | Admitting: Speech Pathology

## 2019-03-27 ENCOUNTER — Encounter: Payer: Self-pay | Admitting: Speech Pathology

## 2019-03-27 DIAGNOSIS — R490 Dysphonia: Secondary | ICD-10-CM | POA: Diagnosis not present

## 2019-03-27 NOTE — Therapy (Signed)
[place] MAIN [facility] SERVICES [address] [place], [place], [address] Phone: [phone]   Fax:  [phone]  Speech Language Pathology Treatment  Patient Details  Name: Jennifer Hodges MRN: [id number] Date of Birth: 02-18-1946 Referring Provider (SLP): Dr. Pryor Ochoa   Encounter Date: 03/27/2019  End of Session - 03/27/19 1558    Visit Number  6    Number of Visits  17    Date for SLP Re-Evaluation  04/12/19    Authorization Type  Medicare    Authorization Time Period  Start 02/14/2019    Authorization - Visit Number  6    Authorization - Number of Visits  10    SLP Start Time  1100    SLP Stop Time   1150    SLP Time Calculation (min)  50 min    Activity Tolerance  Patient tolerated treatment well       Past Medical History:  Diagnosis Date  . Anemia   . Asthma    No Inhalers--Dr. [name] will order as needed  . Bronchiectasis (HCC)    mild  . Chronic headaches     followed by [facility] migraines  . COPD (chronic obstructive pulmonary disease) ([place])   . DDD (degenerative disc disease), lumbar   . Diabetes mellitus without complication [facility])    "doctor says I no longer have diabetes"    . Diverticulosis   . Family history of adverse reaction to anesthesia    PONV  . Gall stones    history of  . GERD (gastroesophageal reflux disease)    EGD 8/09- non bleeding erosive gastritis, documentd esophageal ulcerations.   . Hiatal hernia   . History of pneumonia   . Hypercholesterolemia   . IBS (irritable bowel syndrome)   . Malignant neoplasm of upper lobe of left lung ([place]) 12/27/2018  . Murmur   . Osteoarthritis    lumbar disc disease, left hip  . PONV (postoperative nausea and vomiting)    "Only with last hip and I believe it was due to the morphine"   . Sleep apnea    cpap asked to bring mask and tubing  . Weakness of right side of body     Past Surgical History:  Procedure Laterality Date  . ABDOMINAL HYSTERECTOMY   age 63  . ANTERIOR CERVICAL DECOMP/DISCECTOMY FUSION N/A 02/24/2015   Procedure: CERVICAL FOUR-FIVE, CERVICAL FIVE-SIX, CERVICAL SIX-SEVEN ANTERIOR CERVICAL DECOMPRESSION/DISCECTOMY FUSION ;  Surgeon: [name], MD;  Location: [place] NEURO ORS;  Service: Neurosurgery;  Laterality: N/A;  C45 C56 C67 anterior cervical decompression with fusion interbody prosthesis plating and bonegraft  . APPENDECTOMY    . BACK SURGERY     4th lumbar fusion  . BLADDER SURGERY N/A    with vaginal wall repair  . BREAST CYST ASPIRATION Bilateral    neg  . BREAST SURGERY Bilateral    cyst removed and reduction  . CARDIAC CATHETERIZATION  2014  . CARPAL TUNNEL RELEASE Right 02/11/2016   Procedure: CARPAL TUNNEL RELEASE;  Surgeon: [name], MD;  Location: [facility] ORS;  Service: Orthopedics;  Laterality: Right;  . CATARACT EXTRACTION W/ INTRAOCULAR LENS IMPLANT Bilateral 2015  . CHOLECYSTECTOMY    . EUS N/A 05/31/2012   Procedure: UPPER ENDOSCOPIC ULTRASOUND (EUS) LINEAR;  Surgeon: [name], MD;  Location: [facility];  Service: Endoscopy;  Laterality: N/A;  . EXCISIONAL HEMORRHOIDECTOMY    . JOINT REPLACEMENT Bilateral   . KNEE ARTHROSCOPY WITH LATERAL MENISECTOMY Right  07/07/2015   Procedure: KNEE ARTHROSCOPY WITH LATERAL MENISECTOMY, PARTIAL SYNOVECTOMY;  Surgeon: [name], MD;  Location: [facility] ORS;  Service: Orthopedics;  Laterality: Right;  . LUMBAR LAMINECTOMY    . PORTA CATH INSERTION N/A 01/07/2019   Procedure: PORTA CATH INSERTION;  Surgeon: [name], MD;  Location: [place] CV LAB;  Service: Cardiovascular;  Laterality: N/A;  . REDUCTION MAMMAPLASTY  1990  . RIGHT OOPHORECTOMY    . TOTAL HIP ARTHROPLASTY Left 05/01/2014   Dr. [name]  . TOTAL HIP ARTHROPLASTY Right 08/05/2014   Procedure: TOTAL HIP ARTHROPLASTY ANTERIOR APPROACH;  Surgeon: [name], MD;  Location: [facility] ORS;  Service: Orthopedics;  Laterality: Right;  . ULNAR NERVE TRANSPOSITION Right 02/11/2016    Procedure: ULNAR NERVE DECOMPRESSION/TRANSPOSITION;  Surgeon: [name], MD;  Location: [facility] ORS;  Service: Orthopedics;  Laterality: Right;  Marland Kitchen VIDEO BRONCHOSCOPY WITH ENDOBRONCHIAL ULTRASOUND Left 12/19/2018   Procedure: VIDEO BRONCHOSCOPY WITH ENDOBRONCHIAL ULTRASOUND, LEFT, SLEEP APNEA;  Surgeon: [name], MD;  Location: [facility] ORS;  Service: Thoracic;  Laterality: Left;    There were no vitals filed for this visit.  Subjective Assessment - 03/27/19 1558    Subjective  Patient reports that her sisters say "I sound better"            ADULT SLP TREATMENT - 03/27/19 0001      General Information   Behavior/Cognition  Alert;Cooperative;Pleasant mood    HPI  Jennifer Hodges is 74 year old woman with vocal quality change since May 2020.  The patient has a history of COPD and recent diagnosis of lung cancer, currently undergoing chemoradiation.  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       Treatment Provided   Treatment provided  Cognitive-Linquistic      Pain Assessment   Pain Assessment  No/denies pain      Cognitive-Linquistic Treatment   Treatment focused on  Voice    Skilled Treatment  The patient was provided with written and verbal teaching regarding vocal hygiene.  The patient was provided with written and verbal teaching regarding neck, shoulder, tongue, and throat stretches exercises to promote relaxed phonation. The patient states that talking leaves her exhausted and sore. The patient was provided with written and verbal teaching regarding breath support exercises.  She demonstrates much less clavicular breathing and improved airflow.  Patient instructed in flow phonation through skill level 1: establish airflow release: Unarticulated: significant improvement in duration of airflow.  Articulated (unvoiced): patient demonstrates improving mastery of maintaining airflow while moving articulators.  Skill level 2-  Airflow + Voicing: Unarticulated:   successful .  Articulated:  having best response to imitating therapist with meaningful phrases.  Patient instructed in initial steps of resonant voice therapy, doing well with hum and initial /m/ words.  Patient able to generate clear vocal quality with "oo" and in imitation of phrases and sentences; improved success in spontaneous setting.      Assessment / Recommendations / Plan   Plan  Continue with current plan of care      Progression Toward Goals   Progression toward goals  Progressing toward goals       SLP Education - 03/27/19 1558    Education Details  flow phonation/resonant voice    Person(s) Educated  Patient    Methods  Explanation    Comprehension  Verbalized understanding         SLP Long Term Goals - 02/15/19 1411      SLP LONG TERM GOAL #1  Title  The patient will be independent for abdominal breathing and breath support exercises.    Time  8    Period  Weeks    Status  New    Target Date  04/12/19      SLP LONG TERM GOAL #2   Title  The patient will minimize vocal tension via resonant voice therapy (or comparable technique) with min SLP cues with 80% accuracy.    Time  8    Period  Weeks    Status  New    Target Date  04/12/19      SLP LONG TERM GOAL #3   Title  The patient will maintain relaxed phonation / oral resonance for paragraph length recitation with 80% accuracy.    Time  8    Period  Weeks    Status  New    Target Date  04/12/19       Plan - 03/27/19 1559    Clinical Impression Statement  The patient is responding well to flow phonation therapy and resonant voice therapy.  She has improved her duration for unarticulated airflow and demonstrates improving mastery of maintaining airflow while moving articulators.  The patient has ingrained habit of breath holding while trying to talk, resulting tight hoarse voice and vocal fatigue.  Patient demonstrates improving mastery of voiced airflow with carryover into speech.    Speech Therapy  Frequency  2x / week    Duration  Other (comment)    Treatment/Interventions  Patient/family education;Other (comment)    Potential to Achieve Goals  Fair    Potential Considerations  Ability to learn/carryover information;Previous level of function;Co-morbidities;Severity of impairments;Cooperation/participation level;Medical prognosis;Family/community support    Consulted and Agree with Plan of Care  Patient       Patient will benefit from skilled therapeutic intervention in order to improve the following deficits and impairments:   Dysphonia    Problem List Patient Active Problem List   Diagnosis Date Noted  . Benign breast lumps 01/11/2019  . Gout 01/11/2019  . Hives 01/11/2019  . Lung disease 01/11/2019  . Migraine headache 01/11/2019  . Goals of care, counseling/discussion 12/27/2018  . Malignant neoplasm of upper lobe of left lung ([place]) 12/27/2018  . Night sweats 08/07/2016  . 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 Osteopenia of multiple sites 04/27/2016  . Left carpal tunnel syndrome 02/02/2016  . Carotid stenosis, asymptomatic, bilateral 12/29/2015  . Prediabetes 09/28/2015  . Primary osteoarthritis involving multiple joints 09/28/2015  . Knee pain 05/11/2015  . Chest tightness 03/24/2015  . Stool incontinence 03/24/2015  . Urinary frequency 03/04/2015  . Cervical spondylosis with radiculopathy 02/24/2015  . Pre-syncope 02/19/2015  . URI (upper respiratory infection) 02/15/2015  . Neck pain 12/25/2014  . Pelvic pain in female 11/16/2014  . Primary osteoarthritis of hip 08/05/2014  . Heel pain 06/01/2014  . Diarrhea 06/01/2014  . Health care maintenance 06/01/2014  . Right leg pain 03/30/2014  . Right arm pain 03/30/2014  . Sinusitis 03/30/2014  . Internal nasal lesion 11/12/2013  . Other specified disorders of nose and nasal sinuses 11/12/2013  . Chronic tension-type headache, intractable 10/30/2013  . Intractable migraine without aura and without status  migrainosus 10/30/2013  . Occipital neuralgia of left side 10/30/2013  . IBS (irritable bowel syndrome) 09/03/2013  . Rib pain 08/04/2013  . Cervical radiculitis 08/01/2013  . Lumbar radiculitis 08/01/2013  . Palpitations 07/25/2013  . Benign essential hypertension 05/28/2013  . Neuralgia, neuritis, and radiculitis, unspecified 05/28/2013  . Vitamin D  deficiency 05/28/2013  . Elevated AFP 04/28/2013  . Abnormality of alpha-fetoprotein 04/28/2013  . Osteoporosis 11/12/2012  . Incomplete emptying of bladder 07/11/2012  . Prolapse of vaginal vault after hysterectomy 07/11/2012  . Nonspecific (abnormal) findings on radiological and other examination of gastrointestinal tract 05/31/2012  . Bronchiectasis ([place]) 02/05/2012  . Sleep apnea 02/05/2012  . Degenerative disc disease, cervical 02/05/2012  . Hypercholesterolemia 02/05/2012  . GERD (gastroesophageal reflux disease) 02/05/2012  . Chronic headaches 02/05/2012   Leroy Sea, MS/CCC- SLP  Lou Miner 03/27/2019, 4:00 PM  [place] MAIN [facility] SERVICES [address] [place], [place], [address] Phone: [phone]   Fax:  [phone]   Name: Jennifer Hodges MRN: [id number] Date of Birth: Oct 01, 1945

## 2019-03-29 ENCOUNTER — Ambulatory Visit: Payer: Medicare Other | Admitting: Speech Pathology

## 2019-03-29 ENCOUNTER — Other Ambulatory Visit: Payer: Self-pay

## 2019-03-29 ENCOUNTER — Encounter: Payer: Self-pay | Admitting: Speech Pathology

## 2019-03-29 DIAGNOSIS — R490 Dysphonia: Secondary | ICD-10-CM

## 2019-03-29 NOTE — Therapy (Signed)
[place] MAIN [facility] SERVICES [address] [place], [place], [address] Phone: [phone]   Fax:  [phone]  Speech Language Pathology Treatment  Patient Details  Name: Jennifer Hodges MRN: [id number] Date of Birth: 1945/12/02 Referring Provider (SLP): Dr. Pryor Ochoa   Encounter Date: 03/29/2019  End of Session - 03/29/19 1155    Visit Number  7    Number of Visits  17    Date for SLP Re-Evaluation  04/12/19    Authorization Type  Medicare    Authorization Time Period  Start 02/14/2019    Authorization - Visit Number  7    Authorization - Number of Visits  10    SLP Start Time  0900    SLP Stop Time   0950    SLP Time Calculation (min)  50 min    Activity Tolerance  Patient tolerated treatment well       Past Medical History:  Diagnosis Date  . Anemia   . Asthma    No Inhalers--Dr. [name] will order as needed  . Bronchiectasis (HCC)    mild  . Chronic headaches     followed by [facility] migraines  . COPD (chronic obstructive pulmonary disease) ([place])   . DDD (degenerative disc disease), lumbar   . Diabetes mellitus without complication [facility])    "doctor says I no longer have diabetes"    . Diverticulosis   . Family history of adverse reaction to anesthesia    PONV  . Gall stones    history of  . GERD (gastroesophageal reflux disease)    EGD 8/09- non bleeding erosive gastritis, documentd esophageal ulcerations.   . Hiatal hernia   . History of pneumonia   . Hypercholesterolemia   . IBS (irritable bowel syndrome)   . Malignant neoplasm of upper lobe of left lung ([place]) 12/27/2018  . Murmur   . Osteoarthritis    lumbar disc disease, left hip  . PONV (postoperative nausea and vomiting)    "Only with last hip and I believe it was due to the morphine"   . Sleep apnea    cpap asked to bring mask and tubing  . Weakness of right side of body     Past Surgical History:  Procedure Laterality Date  . ABDOMINAL HYSTERECTOMY   age 54  . ANTERIOR CERVICAL DECOMP/DISCECTOMY FUSION N/A 02/24/2015   Procedure: CERVICAL FOUR-FIVE, CERVICAL FIVE-SIX, CERVICAL SIX-SEVEN ANTERIOR CERVICAL DECOMPRESSION/DISCECTOMY FUSION ;  Surgeon: [name], MD;  Location: [place] NEURO ORS;  Service: Neurosurgery;  Laterality: N/A;  C45 C56 C67 anterior cervical decompression with fusion interbody prosthesis plating and bonegraft  . APPENDECTOMY    . BACK SURGERY     4th lumbar fusion  . BLADDER SURGERY N/A    with vaginal wall repair  . BREAST CYST ASPIRATION Bilateral    neg  . BREAST SURGERY Bilateral    cyst removed and reduction  . CARDIAC CATHETERIZATION  2014  . CARPAL TUNNEL RELEASE Right 02/11/2016   Procedure: CARPAL TUNNEL RELEASE;  Surgeon: [name], MD;  Location: [facility] ORS;  Service: Orthopedics;  Laterality: Right;  . CATARACT EXTRACTION W/ INTRAOCULAR LENS IMPLANT Bilateral 2015  . CHOLECYSTECTOMY    . EUS N/A 05/31/2012   Procedure: UPPER ENDOSCOPIC ULTRASOUND (EUS) LINEAR;  Surgeon: [name], MD;  Location: [facility];  Service: Endoscopy;  Laterality: N/A;  . EXCISIONAL HEMORRHOIDECTOMY    . JOINT REPLACEMENT Bilateral   . KNEE ARTHROSCOPY WITH LATERAL MENISECTOMY Right  07/07/2015   Procedure: KNEE ARTHROSCOPY WITH LATERAL MENISECTOMY, PARTIAL SYNOVECTOMY;  Surgeon: [name], MD;  Location: [facility] ORS;  Service: Orthopedics;  Laterality: Right;  . LUMBAR LAMINECTOMY    . PORTA CATH INSERTION N/A 01/07/2019   Procedure: PORTA CATH INSERTION;  Surgeon: [name], MD;  Location: [place] CV LAB;  Service: Cardiovascular;  Laterality: N/A;  . REDUCTION MAMMAPLASTY  1990  . RIGHT OOPHORECTOMY    . TOTAL HIP ARTHROPLASTY Left 05/01/2014   Dr. [name]  . TOTAL HIP ARTHROPLASTY Right 08/05/2014   Procedure: TOTAL HIP ARTHROPLASTY ANTERIOR APPROACH;  Surgeon: [name], MD;  Location: [facility] ORS;  Service: Orthopedics;  Laterality: Right;  . ULNAR NERVE TRANSPOSITION Right 02/11/2016    Procedure: ULNAR NERVE DECOMPRESSION/TRANSPOSITION;  Surgeon: [name], MD;  Location: [facility] ORS;  Service: Orthopedics;  Laterality: Right;  Marland Kitchen VIDEO BRONCHOSCOPY WITH ENDOBRONCHIAL ULTRASOUND Left 12/19/2018   Procedure: VIDEO BRONCHOSCOPY WITH ENDOBRONCHIAL ULTRASOUND, LEFT, SLEEP APNEA;  Surgeon: [name], MD;  Location: [facility] ORS;  Service: Thoracic;  Laterality: Left;    There were no vitals filed for this visit.  Subjective Assessment - 03/29/19 1155    Subjective  Patient reports that her sisters say "I sound better"            ADULT SLP TREATMENT - 03/29/19 0001      General Information   Behavior/Cognition  Alert;Cooperative;Pleasant mood    HPI  Jennifer Hodges is 74 year old woman with vocal quality change since May 2020.  The patient has a history of COPD and recent diagnosis of lung cancer, currently undergoing chemoradiation.  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       Treatment Provided   Treatment provided  Cognitive-Linquistic      Pain Assessment   Pain Assessment  No/denies pain      Cognitive-Linquistic Treatment   Treatment focused on  Voice    Skilled Treatment  VOICE THERAPY:  The patient read sentences of increased length to begin the session. Patient was prompted by clinician on one trial (f=27) to use her deep voice, on a longer sentence. Patient utilized self-cueing skills throughout the task and consistently monitored her volume and pitch. On a task with visual cues to elicit language, patient was engaged and required minimal cueing from the clinician to utilize proper airflow. Patient demonstrated an increase in pitch and a decrease in volume during unstructured speech, however, her performance was improved from prior sessions. Patient required more cueing from the clinician during the unstructured speech task. She benefited from producing a low and loud "oo" with clinician model. Patient was reminded the importance of  breathing from the diaphragm and avoiding clavicular breathing.       Assessment / Recommendations / Plan   Plan  Continue with current plan of care      Progression Toward Goals   Progression toward goals  Progressing toward goals       SLP Education - 03/29/19 1155    Education Details  flow phonation/resonant voice, self-monitor    Person(s) Educated  Patient    Methods  Explanation    Comprehension  Verbalized understanding         SLP Long Term Goals - 02/15/19 1411      SLP LONG TERM GOAL #1   Title  The patient will be independent for abdominal breathing and breath support exercises.    Time  8    Period  Weeks    Status  New    Target Date  04/12/19      SLP LONG TERM GOAL #2   Title  The patient will minimize vocal tension via resonant voice therapy (or comparable technique) with min SLP cues with 80% accuracy.    Time  8    Period  Weeks    Status  New    Target Date  04/12/19      SLP LONG TERM GOAL #3   Title  The patient will maintain relaxed phonation / oral resonance for paragraph length recitation with 80% accuracy.    Time  8    Period  Weeks    Status  New    Target Date  04/12/19       Plan - 03/29/19 1156    Clinical Impression Statement  The patient is responding well to flow phonation therapy and resonant voice therapy.  She has improved her duration for unarticulated airflow and demonstrates improving mastery of maintaining airflow while moving articulators.  The patient has ingrained habit of breath holding while trying to talk, resulting tight hoarse voice and vocal fatigue.  Patient demonstrates improving mastery of voiced airflow with carryover into speech.    Speech Therapy Frequency  2x / week    Duration  Other (comment)    Treatment/Interventions  Patient/family education;Other (comment)    Potential to Achieve Goals  Good    Potential Considerations  Ability to learn/carryover information;Previous level of  function;Co-morbidities;Severity of impairments;Cooperation/participation level;Medical prognosis;Family/community support    SLP Home Exercise Plan  Provided    Consulted and Agree with Plan of Care  Patient       Patient will benefit from skilled therapeutic intervention in order to improve the following deficits and impairments:   Dysphonia    Problem List Patient Active Problem List   Diagnosis Date Noted  . Benign breast lumps 01/11/2019  . Gout 01/11/2019  . Hives 01/11/2019  . Lung disease 01/11/2019  . Migraine headache 01/11/2019  . Goals of care, counseling/discussion 12/27/2018  . Malignant neoplasm of upper lobe of left lung ([place]) 12/27/2018  . Night sweats 08/07/2016  . 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 Osteopenia of multiple sites 04/27/2016  . Left carpal tunnel syndrome 02/02/2016  . Carotid stenosis, asymptomatic, bilateral 12/29/2015  . Prediabetes 09/28/2015  . Primary osteoarthritis involving multiple joints 09/28/2015  . Knee pain 05/11/2015  . Chest tightness 03/24/2015  . Stool incontinence 03/24/2015  . Urinary frequency 03/04/2015  . Cervical spondylosis with radiculopathy 02/24/2015  . Pre-syncope 02/19/2015  . URI (upper respiratory infection) 02/15/2015  . Neck pain 12/25/2014  . Pelvic pain in female 11/16/2014  . Primary osteoarthritis of hip 08/05/2014  . Heel pain 06/01/2014  . Diarrhea 06/01/2014  . Health care maintenance 06/01/2014  . Right leg pain 03/30/2014  . Right arm pain 03/30/2014  . Sinusitis 03/30/2014  . Internal nasal lesion 11/12/2013  . Other specified disorders of nose and nasal sinuses 11/12/2013  . Chronic tension-type headache, intractable 10/30/2013  . Intractable migraine without aura and without status migrainosus 10/30/2013  . Occipital neuralgia of left side 10/30/2013  . IBS (irritable bowel syndrome) 09/03/2013  . Rib pain 08/04/2013  . Cervical radiculitis 08/01/2013  . Lumbar radiculitis  08/01/2013  . Palpitations 07/25/2013  . Benign essential hypertension 05/28/2013  . Neuralgia, neuritis, and radiculitis, unspecified 05/28/2013  . Vitamin D deficiency 05/28/2013  . Elevated AFP 04/28/2013  . Abnormality of alpha-fetoprotein 04/28/2013  . Osteoporosis 11/12/2012  . Incomplete emptying of  bladder 07/11/2012  . Prolapse of vaginal vault after hysterectomy 07/11/2012  . Nonspecific (abnormal) findings on radiological and other examination of gastrointestinal tract 05/31/2012  . Bronchiectasis ([place]) 02/05/2012  . Sleep apnea 02/05/2012  . Degenerative disc disease, cervical 02/05/2012  . Hypercholesterolemia 02/05/2012  . GERD (gastroesophageal reflux disease) 02/05/2012  . Chronic headaches 02/05/2012   Leroy Sea, MS/CCC- SLP  Lou Miner 03/29/2019, 11:57 AM  [place] MAIN [facility] SERVICES [address] [place], [place], [address] Phone: [phone]   Fax:  [phone]   Name: ENZA SHONE MRN: [id number] Date of Birth: Jun 12, 1945

## 2019-04-01 ENCOUNTER — Ambulatory Visit: Payer: Medicare Other | Admitting: Speech Pathology

## 2019-04-03 ENCOUNTER — Encounter: Payer: Self-pay | Admitting: Oncology

## 2019-04-03 ENCOUNTER — Other Ambulatory Visit: Payer: Self-pay

## 2019-04-03 NOTE — Progress Notes (Signed)
Patient stated that at this time she is able to cough out what she has on her chest. Patient also stated that she had mentioned to physician about her thyroid and being checked with labs. Patient also stated that she had been dizzy for the past two days and with no energy.

## 2019-04-04 ENCOUNTER — Ambulatory Visit: Payer: Medicare Other | Admitting: Speech Pathology

## 2019-04-04 ENCOUNTER — Other Ambulatory Visit: Payer: Self-pay

## 2019-04-04 ENCOUNTER — Inpatient Hospital Stay: Payer: Medicare Other

## 2019-04-04 ENCOUNTER — Inpatient Hospital Stay (HOSPITAL_BASED_OUTPATIENT_CLINIC_OR_DEPARTMENT_OTHER): Payer: Medicare Other | Admitting: Oncology

## 2019-04-04 VITALS — BP 111/70 | HR 86 | Temp 97.7°F | Wt 166.1 lb

## 2019-04-04 DIAGNOSIS — Z5112 Encounter for antineoplastic immunotherapy: Secondary | ICD-10-CM

## 2019-04-04 DIAGNOSIS — R490 Dysphonia: Secondary | ICD-10-CM | POA: Diagnosis not present

## 2019-04-04 DIAGNOSIS — C3412 Malignant neoplasm of upper lobe, left bronchus or lung: Secondary | ICD-10-CM

## 2019-04-04 LAB — COMPREHENSIVE METABOLIC PANEL
ALT: 18 U/L (ref 0–44)
AST: 20 U/L (ref 15–41)
Albumin: 4 g/dL (ref 3.5–5.0)
Alkaline Phosphatase: 61 U/L (ref 38–126)
Anion gap: 7 (ref 5–15)
BUN: 16 mg/dL (ref 8–23)
CO2: 27 mmol/L (ref 22–32)
Calcium: 9.5 mg/dL (ref 8.9–10.3)
Chloride: 105 mmol/L (ref 98–111)
Creatinine, Ser: 0.51 mg/dL (ref 0.44–1.00)
GFR calc Af Amer: >60 mL/min (ref >60)
GFR calc non Af Amer: >60 mL/min (ref >60)
Glucose, Bld: 94 mg/dL (ref 70–99)
Potassium: 4.1 mmol/L (ref 3.5–5.1)
Sodium: 139 mmol/L (ref 135–145)
Total Bilirubin: 0.5 mg/dL (ref 0.3–1.2)
Total Protein: 6.6 g/dL (ref 6.5–8.1)

## 2019-04-04 LAB — CBC WITH DIFFERENTIAL/PLATELET
Abs Immature Granulocytes: 0 10*3/uL (ref 0.00–0.07)
Basophils Absolute: 0 10*3/uL (ref 0.0–0.1)
Basophils Relative: 1 %
Eosinophils Absolute: 0.1 10*3/uL (ref 0.0–0.5)
Eosinophils Relative: 4 %
HCT: 34.9 % — ABNORMAL LOW (ref 36.0–46.0)
Hemoglobin: 11.3 g/dL — ABNORMAL LOW (ref 12.0–15.0)
Immature Granulocytes: 0 %
Lymphocytes Relative: 18 %
Lymphs Abs: 0.6 10*3/uL — ABNORMAL LOW (ref 0.7–4.0)
MCH: 31.7 pg (ref 26.0–34.0)
MCHC: 32.4 g/dL (ref 30.0–36.0)
MCV: 98 fL (ref 80.0–100.0)
Monocytes Absolute: 0.4 10*3/uL (ref 0.1–1.0)
Monocytes Relative: 11 %
Neutro Abs: 2.4 10*3/uL (ref 1.7–7.7)
Neutrophils Relative %: 66 %
Platelets: 219 10*3/uL (ref 150–400)
RBC: 3.56 MIL/uL — ABNORMAL LOW (ref 3.87–5.11)
RDW: 16.3 % — ABNORMAL HIGH (ref 11.5–15.5)
WBC: 3.6 10*3/uL — ABNORMAL LOW (ref 4.0–10.5)
nRBC: 0 % (ref 0.0–0.2)

## 2019-04-04 LAB — TSH: TSH: 2.227 u[IU]/mL (ref 0.350–4.500)

## 2019-04-04 MED ORDER — SODIUM CHLORIDE 0.9% FLUSH
10.0000 mL | INTRAVENOUS | Status: DC | PRN
  Administered 2019-04-04: 10 mL via INTRAVENOUS
  Filled 2019-04-04: qty 10

## 2019-04-04 MED ORDER — SODIUM CHLORIDE 0.9 % IV SOLN
10.0000 mg/kg | Freq: Once | INTRAVENOUS | Status: AC
  Administered 2019-04-04: 740 mg via INTRAVENOUS
  Filled 2019-04-04: qty 4.8

## 2019-04-04 MED ORDER — HEPARIN SOD (PORK) LOCK FLUSH 100 UNIT/ML IV SOLN
INTRAVENOUS | Status: AC
  Filled 2019-04-04: qty 5

## 2019-04-04 MED ORDER — SODIUM CHLORIDE 0.9 % IV SOLN
Freq: Once | INTRAVENOUS | Status: AC
  Filled 2019-04-04: qty 250

## 2019-04-04 MED ORDER — HEPARIN SOD (PORK) LOCK FLUSH 100 UNIT/ML IV SOLN
500.0000 [IU] | Freq: Once | INTRAVENOUS | Status: AC
  Administered 2019-04-04: 500 [IU] via INTRAVENOUS
  Filled 2019-04-04: qty 5

## 2019-04-05 ENCOUNTER — Encounter: Payer: Self-pay | Admitting: Speech Pathology

## 2019-04-05 NOTE — Progress Notes (Signed)
Hematology/Oncology Consult note [facility]  Telephone:([phone] [phone]  Patient Care Team: [name], MD as PCP - General (Family Medicine) Telford Nab, RN as Registered Nurse   Name of the patient: Jennifer Hodges  [id number]  09-26-1945   Date of visit: 04/05/19  Diagnosis- Adenocarcinoma of the lung stage III T1b N2 M0  Chief complaint/ Reason for visit-on treatment assessment prior to cycle 1 of durvalumab  Heme/Onc history: patient is a 74 year old female who was seen by pulmonary and ENT for ongoing symptoms of bronchitis and possible laryngitis and received antibiotics for the same.She underwent CT chest in September 2020 which showed multiple bilateral lung nodules with associated mediastinal adenopathy and possible primary left upper lobe lung mass. This was followed by a PET CT scan which showed a left upper lobe lung mass measuring 1.9 x 1.2 cm with an SUV of 10.7. Conglomerate AP window adenopathy measuring 5.8 x 2.1 cm with an SUV of 14. She was also noted to have hypermetabolic left hilar adenopathy and left supraclavicular 0.7 cm lymph node with an SUV of 4.6. Noted to have a 7 mm right lower lobe lung nodule with faint metabolic activity. No evidence of other distant metastatic disease. This was followed by a CT super D chest without contrast. The right lower lobe lung nodules were somewhat larger as compared to September 2020. 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  Energy levels are a little better.  ECOG PS- 1 Pain scale- 0   Review of systems- Review of Systems  Constitutional: Positive for malaise/fatigue. Negative for chills, fever and weight loss.  HENT: Negative for congestion, ear discharge and nosebleeds.   Eyes: Negative for blurred vision.  Respiratory: Positive for cough and sputum production. Negative for hemoptysis, shortness of breath and wheezing.   Cardiovascular: Negative for chest pain, palpitations, orthopnea and claudication.  Gastrointestinal: Negative for abdominal pain, blood in stool, constipation, diarrhea, heartburn, melena, nausea and vomiting.  Genitourinary: Negative for dysuria, flank pain, frequency, hematuria and urgency.  Musculoskeletal: Negative for back pain, joint pain and myalgias.  Skin: Negative for rash.  Neurological: Negative for dizziness, tingling, focal weakness, seizures, weakness and headaches.  Endo/Heme/Allergies: Does not bruise/bleed easily.  Psychiatric/Behavioral: Negative for depression and suicidal ideas. The patient does not have insomnia.       Allergies  Allergen Reactions  . Aspirin Hives and Other (See Comments)    Difficulty breathing  . Celebrex [Celecoxib] Shortness Of Breath  . Morphine And Related Nausea And Vomiting and Swelling  . Adhesive [Tape] Other (See Comments)    Took top layer of skin off when removed.  . Clarithromycin Nausea And Vomiting  . Codeine Nausea And Vomiting  . Darvon [Propoxyphene Hcl] Nausea And Vomiting  . Demerol [Meperidine] Nausea And Vomiting  . Flonase [Fluticasone Propionate] Other (See Comments)    Fungal infection  . Simvastatin Other (See Comments)    "caused ulcers in mouth, and fever"  . Talwin [Pentazocine] Nausea And Vomiting     Past Medical History:  Diagnosis Date  . Anemia   .  Asthma    No Inhalers--Dr. [name] will order as needed  . Bronchiectasis (HCC)    mild  . Chronic headaches     followed by  [facility] migraines  . COPD (chronic obstructive pulmonary disease) ([place])   . DDD (degenerative disc disease), lumbar   . Diabetes mellitus without complication [facility])    "doctor says I no longer have diabetes"    . Diverticulosis   . Family history of adverse reaction to anesthesia    PONV  . Gall stones    history of  . GERD (gastroesophageal reflux disease)    EGD 8/09- non bleeding erosive gastritis, documentd esophageal ulcerations.   . Hiatal hernia   . History of pneumonia   . Hypercholesterolemia   . IBS (irritable bowel syndrome)   . Malignant neoplasm of upper lobe of left lung ([place]) 12/27/2018  . Murmur   . Osteoarthritis    lumbar disc disease, left hip  . PONV (postoperative nausea and vomiting)    "Only with last hip and I believe it was due to the morphine"   . Sleep apnea    cpap asked to bring mask and tubing  . Weakness of right side of body      Past Surgical History:  Procedure Laterality Date  . ABDOMINAL HYSTERECTOMY  age 42  . ANTERIOR CERVICAL DECOMP/DISCECTOMY FUSION N/A 02/24/2015   Procedure: CERVICAL FOUR-FIVE, CERVICAL FIVE-SIX, CERVICAL SIX-SEVEN ANTERIOR CERVICAL DECOMPRESSION/DISCECTOMY FUSION ;  Surgeon: [name], MD;  Location: [place];  Service: Neurosurgery;  Laterality: N/A;  C45 C56 C67 anterior cervical decompression with fusion interbody prosthesis plating and bonegraft  . APPENDECTOMY    . BACK SURGERY     4th lumbar fusion  . BLADDER SURGERY N/A    with vaginal wall repair  . BREAST CYST ASPIRATION Bilateral    neg  . BREAST SURGERY Bilateral    cyst removed and reduction  . CARDIAC CATHETERIZATION  2014  . CARPAL TUNNEL RELEASE Right 02/11/2016   Procedure: CARPAL TUNNEL RELEASE;  Surgeon: [name], MD;  Location: [facility];  Service: Orthopedics;  Laterality: Right;  . CATARACT EXTRACTION W/ INTRAOCULAR LENS IMPLANT Bilateral 2015  . CHOLECYSTECTOMY    . EUS N/A 05/31/2012   Procedure: UPPER ENDOSCOPIC  ULTRASOUND (EUS) LINEAR;  Surgeon: [name], MD;  Location: [facility];  Service: Endoscopy;  Laterality: N/A;  . EXCISIONAL HEMORRHOIDECTOMY    . JOINT REPLACEMENT Bilateral   . KNEE ARTHROSCOPY WITH LATERAL MENISECTOMY Right 07/07/2015   Procedure: KNEE ARTHROSCOPY WITH LATERAL MENISECTOMY, PARTIAL SYNOVECTOMY;  Surgeon: [name], MD;  Location: [facility];  Service: Orthopedics;  Laterality: Right;  . LUMBAR LAMINECTOMY    . PORTA CATH INSERTION N/A 01/07/2019   Procedure: PORTA CATH INSERTION;  Surgeon: [name], MD;  Location: [facility];  Service: Cardiovascular;  Laterality: N/A;  . REDUCTION MAMMAPLASTY  1990  . RIGHT OOPHORECTOMY    . TOTAL HIP ARTHROPLASTY Left 05/01/2014   Dr. [name]  . TOTAL HIP ARTHROPLASTY Right 08/05/2014   Procedure: TOTAL HIP ARTHROPLASTY ANTERIOR APPROACH;  Surgeon: [name], MD;  Location: [facility];  Service: Orthopedics;  Laterality: Right;  . ULNAR NERVE TRANSPOSITION Right 02/11/2016   Procedure: ULNAR NERVE DECOMPRESSION/TRANSPOSITION;  Surgeon: [name], MD;  Location: [facility];  Service: Orthopedics;  Laterality: Right;  Marland Kitchen VIDEO BRONCHOSCOPY WITH ENDOBRONCHIAL ULTRASOUND Left 12/19/2018   Procedure: VIDEO BRONCHOSCOPY WITH ENDOBRONCHIAL ULTRASOUND, LEFT, SLEEP APNEA;  Surgeon: [name], MD;  Location: [facility];  Service: Thoracic;  Laterality: Left;    Social History   Socioeconomic History  . Marital status: Married    Spouse name: Not on file  . Number of children: Not on file  . Years of education: Not on file  . Highest education level: Not on file  Occupational History  . Not on file  Tobacco Use  . Smoking status: Former Smoker    Quit date: 06/13/2007    Years since quitting: 11.8  . Smokeless tobacco: Never Used  Substance and Sexual Activity  . Alcohol use: No    Alcohol/week: 0.0 standard drinks  . Drug use: No  . Sexual activity: Never  Other Topics Concern  . Not on file  Social  History Narrative  . Not on file   Social Determinants of Health   Financial Resource Strain:   . Difficulty of Paying Living Expenses: Not on file  Food Insecurity:   . Worried About Charity fundraiser in the Last Year: Not on file  . Ran Out of Food in the Last Year: Not on file  Transportation Needs:   . Lack of Transportation (Medical): Not on file  . Lack of Transportation (Non-Medical): Not on file  Physical Activity:   . Days of Exercise per Week: Not on file  . Minutes of Exercise per Session: Not on file  Stress:   . Feeling of Stress : Not on file  Social Connections:   . Frequency of Communication with Friends and Family: Not on file  . Frequency of Social Gatherings with Friends and Family: Not on file  . Attends Religious Services: Not on file  . Active Member of Clubs or Organizations: Not on file  . Attends Archivist Meetings: Not on file  . Marital Status: Not on file  Intimate Partner Violence:   . Fear of Current or Ex-Partner: Not on file  . Emotionally Abused: Not on file  . Physically Abused: Not on file  . Sexually Abused: Not on file    Family History  Problem Relation Age of Onset  . Heart disease Mother        s/p stent  . Hypertension Mother   . Hypercholesterolemia Mother   . Diabetes Father   . Stomach cancer Other        uncle  . Breast cancer Cousin   . Breast cancer Paternal Aunt      Current Outpatient Medications:  .  Bioflavonoid Products (ESTER C PO), Take 1 tablet by mouth 3 (three) times daily., Disp: , Rfl:  .  Calcium-Magnesium-Zinc (CAL-MAG-ZINC PO), Take 1 tablet by mouth daily., Disp: , Rfl:  .  cetirizine (ZYRTEC) 5 MG tablet, Take 1 tablet (5 mg total) by mouth daily. (Patient taking differently: Take 5 mg by mouth daily as needed (runny nose.). ), Disp: 30 tablet, Rfl: 0 .  CHLORASEPTIC MAX SORE THROAT 1.5-33 % LIQD, Use as directed 1 spray in the mouth or throat as needed (throat irritation.)., Disp: , Rfl:    .  Cholecalciferol (VITAMIN D3) 25 MCG (1000 UT) CHEW, Chew 5,000 Units by mouth daily., Disp: , Rfl:  .  Coenzyme Q10 (CO Q 10) 100 MG CAPS, Take 100 mg by mouth daily. , Disp: , Rfl:  .  Cyanocobalamin (VITAMIN B-12) 2500 MCG SUBL, Place 2,500 mcg under the tongue daily., Disp: , Rfl:  .  cyclobenzaprine (FLEXERIL) 10 MG tablet, Take 10 mg by mouth at bedtime. , Disp: ,  Rfl:  .  DHEA 25 MG CAPS, Take 25 mg by mouth daily., Disp: , Rfl:  .  ezetimibe (ZETIA) 10 MG tablet, Take 10 mg by mouth at bedtime., Disp: , Rfl:  .  folic acid (FOLVITE) 621 MCG tablet, Take 400 mcg by mouth 2 (two) times daily. , Disp: , Rfl:  .  Garlic 3086 MG CAPS, Take 1,000 mg by mouth daily., Disp: , Rfl:  .  Histamine Dihydrochloride (AUSTRALIAN DREAM ARTHRITIS EX), Apply 1 application topically 4 (four) times daily as needed (pain.)., Disp: , Rfl:  .  HYDROcodone-acetaminophen (NORCO/VICODIN) 5-325 MG tablet, 1-2 tabs po bid prn (Patient taking differently: Take 1 tablet by mouth at bedtime. ), Disp: 10 tablet, Rfl: 0 .  HYDROcodone-homatropine (HYCODAN) 5-1.5 MG/5ML syrup, Take 5 mLs by mouth 3 (three) times daily as needed for cough., Disp: 120 mL, Rfl: 0 .  Polyethyl Glycol-Propyl Glycol (SYSTANE) 0.4-0.3 % SOLN, Place 1 drop into both eyes 5 (five) times daily as needed (dry/irritated eyes). , Disp: , Rfl:  .  sucralfate (CARAFATE) 1 g tablet, 1 tablet daily., Disp: , Rfl:  .  vitamin E 400 UNIT capsule, Take 400 Units by mouth daily., Disp: , Rfl:  .  albuterol (PROVENTIL) (2.5 MG/3ML) 0.083% nebulizer solution, Take 2.5 mg by nebulization every 6 (six) hours as needed for wheezing., Disp: , Rfl:  .  albuterol (VENTOLIN HFA) 108 (90 Base) MCG/ACT inhaler, Inhale 2 puffs into the lungs every 6 (six) hours as needed for wheezing or shortness of breath. (Patient not taking: Reported on 04/03/2019), Disp: 18 g, Rfl: 0 .  azithromycin (ZITHROMAX Z-PAK) 250 MG tablet, Follow  Package instructions (Patient not taking:  Reported on 04/03/2019), Disp: 6 each, Rfl: 0 .  ipratropium (ATROVENT) 0.06 % nasal spray, Place 2 sprays into both nostrils 4 (four) times daily as needed for rhinitis. (Patient not taking: Reported on 04/03/2019), Disp: 15 mL, Rfl: 0 .  montelukast (SINGULAIR) 10 MG tablet, Take 10 mg by mouth at bedtime. , Disp: , Rfl:  .  ondansetron (ZOFRAN-ODT) 4 MG disintegrating tablet, Take 4 mg by mouth every 8 (eight) hours as needed for nausea/vomiting., Disp: , Rfl:  .  pantoprazole (PROTONIX) 40 MG tablet, Take 1 tablet (40 mg total) by mouth 2 (two) times daily before a meal. (Patient not taking: Reported on 04/03/2019), Disp: 60 tablet, Rfl: 1 .  pyridOXINE (VITAMIN B-6) 100 MG tablet, Take 100 mg by mouth daily., Disp: , Rfl:  .  umeclidinium-vilanterol (ANORO ELLIPTA) 62.5-25 MCG/INH AEPB, Inhale into the lungs., Disp: , Rfl:  .  Zoledronic Acid (RECLAST IV), Inject 1 Dose into the vein as directed. ONCE A YEAR, Disp: , Rfl:   Physical exam:  Vitals:   04/04/19 0929  BP: 111/70  Pulse: 86  Temp: 97.7 F (36.5 C)  TempSrc: Tympanic  SpO2: 97%  Weight: 166 lb 1.6 oz (75.3 kg)   Physical Exam HENT:     Head: Normocephalic and atraumatic.  Eyes:     Pupils: Pupils are equal, round, and reactive to light.  Cardiovascular:     Rate and Rhythm: Normal rate and regular rhythm.     Heart sounds: Normal heart sounds.  Pulmonary:     Effort: Pulmonary effort is normal.     Breath sounds: Normal breath sounds.  Abdominal:     General: Bowel sounds are normal.     Palpations: Abdomen is soft.  Musculoskeletal:     Cervical back: Normal range of motion.  Skin:    General: Skin is warm and dry.  Neurological:     Mental Status: She is alert and oriented to person, place, and time.      CMP Latest Ref Rng & Units 04/04/2019  Glucose 70 - 99 mg/dL 94  BUN 8 - 23 mg/dL 16  Creatinine 0.44 - 1.00 mg/dL 0.51  Sodium 135 - 145 mmol/L 139  Potassium 3.5 - 5.1 mmol/L 4.1  Chloride 98 - 111  mmol/L 105  CO2 22 - 32 mmol/L 27  Calcium 8.9 - 10.3 mg/dL 9.5  Total Protein 6.5 - 8.1 g/dL 6.6  Total Bilirubin 0.3 - 1.2 mg/dL 0.5  Alkaline Phos 38 - 126 U/L 61  AST 15 - 41 U/L 20  ALT 0 - 44 U/L 18   CBC Latest Ref Rng & Units 04/04/2019  WBC 4.0 - 10.5 K/uL 3.6(L)  Hemoglobin 12.0 - 15.0 g/dL 11.3(L)  Hematocrit 36.0 - 46.0 % 34.9(L)  Platelets 150 - 400 K/uL 219    No images are attached to the encounter.  CT Chest W Contrast  Result Date: 03/22/2019 CLINICAL DATA:  Stage III left-sided lung cancer, treated with radiation therapy and chemotherapy. Pre immunotherapy. Productive cough shortness of breath. COPD. Gastroesophageal reflux disease. EXAM: CT CHEST, ABDOMEN, AND PELVIS WITH CONTRAST TECHNIQUE: Multidetector CT imaging of the chest, abdomen and pelvis was performed following the standard protocol during bolus administration of intravenous contrast. CONTRAST:  140m OMNIPAQUE IOHEXOL 300 MG/ML  SOLN COMPARISON:  Chest CT of 12/19/2018. Most recent abdominopelvic CT of 01/21/2019. FINDINGS: CT CHEST FINDINGS Cardiovascular: Right Port-A-Cath tip high right atrium. Aortic atherosclerosis. Tortuous thoracic aorta. Normal heart size, without pericardial effusion. Multivessel coronary artery atherosclerosis. No central pulmonary embolism, on this non-dedicated study. Mediastinum/Nodes: No supraclavicular adenopathy. Hypoattenuating left thyroid nodule of 8 mm is nonspecific. Low right paratracheal node of 11 mm on 21/5 103, 1.2 cm on the prior. Nodal versus central left upper lobe lung mass in the region of the AP window measures 4.1 x 2.4 cm on 19/5 103. Compare 4.5 x 2.6 cm on the prior exam (when remeasured). A necrotic left infrahilar node of 1.3 cm is decreased from 1.8 cm on the prior exam (when remeasured). Contrast level in the distal esophagus. Lungs/Pleura: No pleural fluid. Moderate centrilobular emphysema. Lower lobe predominant bronchial wall thickening. Bilateral pulmonary  nodules. These are decreased in size. For example: 5 mm right upper lobe pulmonary nodule on 32/504, 7 mm on the prior exam (when remeasured). Anterior left upper lobe pulmonary nodule of 1.6 cm on 49/504, decreased from 1.9 cm on the prior exam (when remeasured). Right lower lobe 5 mm nodule on 99/504, 8 mm on 01/21/2019. Musculoskeletal: No acute osseous abnormality. CT ABDOMEN PELVIS FINDINGS Hepatobiliary: Normal liver. Cholecystectomy. Upper normal common duct caliber at 1.0 cm. Pancreas: Suspect pancreas divisum, with a prominent dorsal duct entering the duodenum on 68/3. No acute inflammation or duct dilatation. Spleen: Normal in size, without focal abnormality. Adrenals/Urinary Tract: Normal adrenal glands. Too small to characterize lesions in both kidneys. No hydronephrosis. Degraded evaluation of the pelvis, secondary to beam hardening artifact from bilateral hip arthroplasty. Grossly normal urinary bladder. Stomach/Bowel: Normal stomach, without wall thickening. Colonic stool burden suggests constipation. Normal terminal ileum. Normal small bowel. Vascular/Lymphatic: Aortic and branch vessel atherosclerosis. No abdominal and no gross pelvic sidewall adenopathy. Reproductive: Hysterectomy.  No adnexal mass. Other: Moderate pelvic floor laxity. Musculoskeletal: Bilateral hip arthroplasty. Lumbosacral spondylosis with disc bulges including at L5-S1 and L3-4. IMPRESSION:  1. Response to therapy of pulmonary lesions and thoracic adenopathy. 2. No new or progressive disease. 3. No acute process or evidence of metastatic disease in the abdomen or pelvis. 4. Aortic atherosclerosis (ICD10-I70.0), coronary artery atherosclerosis and emphysema (ICD10-J43.9). 5. Esophageal air fluid level suggests dysmotility or gastroesophageal reflux. 6.  Possible constipation. Electronically Signed   By: Abigail Miyamoto M.D.   On: 03/22/2019 10:24   CT ABDOMEN PELVIS W CONTRAST  Result Date: 03/22/2019 CLINICAL DATA:  Stage III  left-sided lung cancer, treated with radiation therapy and chemotherapy. Pre immunotherapy. Productive cough shortness of breath. COPD. Gastroesophageal reflux disease. EXAM: CT CHEST, ABDOMEN, AND PELVIS WITH CONTRAST TECHNIQUE: Multidetector CT imaging of the chest, abdomen and pelvis was performed following the standard protocol during bolus administration of intravenous contrast. CONTRAST:  146m OMNIPAQUE IOHEXOL 300 MG/ML  SOLN COMPARISON:  Chest CT of 12/19/2018. Most recent abdominopelvic CT of 01/21/2019. FINDINGS: CT CHEST FINDINGS Cardiovascular: Right Port-A-Cath tip high right atrium. Aortic atherosclerosis. Tortuous thoracic aorta. Normal heart size, without pericardial effusion. Multivessel coronary artery atherosclerosis. No central pulmonary embolism, on this non-dedicated study. Mediastinum/Nodes: No supraclavicular adenopathy. Hypoattenuating left thyroid nodule of 8 mm is nonspecific. Low right paratracheal node of 11 mm on 21/5 103, 1.2 cm on the prior. Nodal versus central left upper lobe lung mass in the region of the AP window measures 4.1 x 2.4 cm on 19/5 103. Compare 4.5 x 2.6 cm on the prior exam (when remeasured). A necrotic left infrahilar node of 1.3 cm is decreased from 1.8 cm on the prior exam (when remeasured). Contrast level in the distal esophagus. Lungs/Pleura: No pleural fluid. Moderate centrilobular emphysema. Lower lobe predominant bronchial wall thickening. Bilateral pulmonary nodules. These are decreased in size. For example: 5 mm right upper lobe pulmonary nodule on 32/504, 7 mm on the prior exam (when remeasured). Anterior left upper lobe pulmonary nodule of 1.6 cm on 49/504, decreased from 1.9 cm on the prior exam (when remeasured). Right lower lobe 5 mm nodule on 99/504, 8 mm on 01/21/2019. Musculoskeletal: No acute osseous abnormality. CT ABDOMEN PELVIS FINDINGS Hepatobiliary: Normal liver. Cholecystectomy. Upper normal common duct caliber at 1.0 cm. Pancreas: Suspect  pancreas divisum, with a prominent dorsal duct entering the duodenum on 68/3. No acute inflammation or duct dilatation. Spleen: Normal in size, without focal abnormality. Adrenals/Urinary Tract: Normal adrenal glands. Too small to characterize lesions in both kidneys. No hydronephrosis. Degraded evaluation of the pelvis, secondary to beam hardening artifact from bilateral hip arthroplasty. Grossly normal urinary bladder. Stomach/Bowel: Normal stomach, without wall thickening. Colonic stool burden suggests constipation. Normal terminal ileum. Normal small bowel. Vascular/Lymphatic: Aortic and branch vessel atherosclerosis. No abdominal and no gross pelvic sidewall adenopathy. Reproductive: Hysterectomy.  No adnexal mass. Other: Moderate pelvic floor laxity. Musculoskeletal: Bilateral hip arthroplasty. Lumbosacral spondylosis with disc bulges including at L5-S1 and L3-4. IMPRESSION: 1. Response to therapy of pulmonary lesions and thoracic adenopathy. 2. No new or progressive disease. 3. No acute process or evidence of metastatic disease in the abdomen or pelvis. 4. Aortic atherosclerosis (ICD10-I70.0), coronary artery atherosclerosis and emphysema (ICD10-J43.9). 5. Esophageal air fluid level suggests dysmotility or gastroesophageal reflux. 6.  Possible constipation. Electronically Signed   By: KAbigail MiyamotoM.D.   On: 03/22/2019 10:24     Assessment and plan- Patient is a 74y.o. female with stage III adenocarcinoma of the lung here for on treatment assessment prior to cycle 1 of maintenance durvalumab  Scans after concurrent chemoradiation showed partial response.  Counts are  okay to proceed with cycle 1 of maintenance durvalumab today.  Her hemoglobin is improved from 10.3-11.3.  She has mild leukopenia which we will continue to monitor.  Her baseline TSH is normal.  Discussed that she will need durvalumab IV every 2 weeks for a year.  Discussed risks and benefits of durvalumab including all but not limited to  autoimmune side effect such as autoimmune hepatitis, colitis, thyroiditis and need to monitor kidney and liver functions as well as skin rash.  Patient understands and agrees to proceed as planned.  Treatment is being given with a curative intent.  I will see her back in 2 weeks time for cycle 2.  Cough/sputum production: Symptomatically better with conservative measures and suspect this is secondary to recent radiation.  Continue to monitor   Visit Diagnosis 1. Encounter for antineoplastic immunotherapy   2. Malignant neoplasm of upper lobe of left lung ([place])      Dr. Randa Evens, MD, MPH [facility] [phone] 04/05/2019 12:08 PM

## 2019-04-05 NOTE — Therapy (Signed)
[place] MAIN [facility] Main SERVICES [address] [place], [place], [address] Phone: [phone]   Fax:  [phone]  Speech Language Pathology Treatment  Patient Details  Name: Jennifer Hodges MRN: [id number] Date of Birth: 11-05-1945 Referring Provider (SLP): Dr. Pryor Ochoa   Encounter Date: 04/04/2019  End of Session - 04/05/19 0853    Visit Number  8    Number of Visits  17    Date for SLP Re-Evaluation  04/12/19    Authorization Type  Medicare    Authorization Time Period  Start 02/14/2019    Authorization - Visit Number  8    Authorization - Number of Visits  10    SLP Start Time  1400    SLP Stop Time   1450    SLP Time Calculation (min)  50 min    Activity Tolerance  Patient tolerated treatment well       Past Medical History:  Diagnosis Date  . Anemia   . Asthma    No Inhalers--Dr. [name] will order as needed  . Bronchiectasis (HCC)    mild  . Chronic headaches     followed by [facility] migraines  . COPD (chronic obstructive pulmonary disease) ([place])   . DDD (degenerative disc disease), lumbar   . Diabetes mellitus without complication [facility])    "doctor says I no longer have diabetes"    . Diverticulosis   . Family history of adverse reaction to anesthesia    PONV  . Gall stones    history of  . GERD (gastroesophageal reflux disease)    EGD 8/09- non bleeding erosive gastritis, documentd esophageal ulcerations.   . Hiatal hernia   . History of pneumonia   . Hypercholesterolemia   . IBS (irritable bowel syndrome)   . Malignant neoplasm of upper lobe of left lung ([place]) 12/27/2018  . Murmur   . Osteoarthritis    lumbar disc disease, left hip  . PONV (postoperative nausea and vomiting)    "Only with last hip and I believe it was due to the morphine"   . Sleep apnea    cpap asked to bring mask and tubing  . Weakness of right side of body     Past Surgical History:  Procedure Laterality Date  . ABDOMINAL HYSTERECTOMY   age 48  . ANTERIOR CERVICAL DECOMP/DISCECTOMY FUSION N/A 02/24/2015   Procedure: CERVICAL FOUR-FIVE, CERVICAL FIVE-SIX, CERVICAL SIX-SEVEN ANTERIOR CERVICAL DECOMPRESSION/DISCECTOMY FUSION ;  Surgeon: [name], MD;  Location: [place] NEURO ORS;  Service: Neurosurgery;  Laterality: N/A;  C45 C56 C67 anterior cervical decompression with fusion interbody prosthesis plating and bonegraft  . APPENDECTOMY    . BACK SURGERY     4th lumbar fusion  . BLADDER SURGERY N/A    with vaginal wall repair  . BREAST CYST ASPIRATION Bilateral    neg  . BREAST SURGERY Bilateral    cyst removed and reduction  . CARDIAC CATHETERIZATION  2014  . CARPAL TUNNEL RELEASE Right 02/11/2016   Procedure: CARPAL TUNNEL RELEASE;  Surgeon: [name], MD;  Location: [facility] ORS;  Service: Orthopedics;  Laterality: Right;  . CATARACT EXTRACTION W/ INTRAOCULAR LENS IMPLANT Bilateral 2015  . CHOLECYSTECTOMY    . EUS N/A 05/31/2012   Procedure: UPPER ENDOSCOPIC ULTRASOUND (EUS) LINEAR;  Surgeon: [name], MD;  Location: [facility];  Service: Endoscopy;  Laterality: N/A;  . EXCISIONAL HEMORRHOIDECTOMY    . JOINT REPLACEMENT Bilateral   . KNEE ARTHROSCOPY WITH LATERAL MENISECTOMY Right  07/07/2015   Procedure: KNEE ARTHROSCOPY WITH LATERAL MENISECTOMY, PARTIAL SYNOVECTOMY;  Surgeon: [name], MD;  Location: [facility] ORS;  Service: Orthopedics;  Laterality: Right;  . LUMBAR LAMINECTOMY    . PORTA CATH INSERTION N/A 01/07/2019   Procedure: PORTA CATH INSERTION;  Surgeon: [name], MD;  Location: [place] CV LAB;  Service: Cardiovascular;  Laterality: N/A;  . REDUCTION MAMMAPLASTY  1990  . RIGHT OOPHORECTOMY    . TOTAL HIP ARTHROPLASTY Left 05/01/2014   Dr. [name]  . TOTAL HIP ARTHROPLASTY Right 08/05/2014   Procedure: TOTAL HIP ARTHROPLASTY ANTERIOR APPROACH;  Surgeon: [name], MD;  Location: [facility] ORS;  Service: Orthopedics;  Laterality: Right;  . ULNAR NERVE TRANSPOSITION Right 02/11/2016    Procedure: ULNAR NERVE DECOMPRESSION/TRANSPOSITION;  Surgeon: [name], MD;  Location: [facility] ORS;  Service: Orthopedics;  Laterality: Right;  Marland Kitchen VIDEO BRONCHOSCOPY WITH ENDOBRONCHIAL ULTRASOUND Left 12/19/2018   Procedure: VIDEO BRONCHOSCOPY WITH ENDOBRONCHIAL ULTRASOUND, LEFT, SLEEP APNEA;  Surgeon: [name], MD;  Location: [facility] ORS;  Service: Thoracic;  Laterality: Left;    There were no vitals filed for this visit.  Subjective Assessment - 04/05/19 0852    Subjective  Patient was engaged in the session.            ADULT SLP TREATMENT - 04/05/19 0001      General Information   Behavior/Cognition  Alert;Cooperative;Pleasant mood    HPI  Jennifer Hodges is 74 year old woman with vocal quality change since May 2020.  The patient has a history of COPD and recent diagnosis of lung cancer, currently undergoing chemoradiation.  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       Treatment Provided   Treatment provided  Cognitive-Linquistic      Pain Assessment   Pain Assessment  No/denies pain      Cognitive-Linquistic Treatment   Treatment focused on  Voice    Skilled Treatment  Patient was asked to read 50 sentences with alternating word emphasis and required multiple cues to decrease pitch and increase volume. Patient was educated on difference between emphasizing a word and raising pitch. Patient was asked to name 3 items in a category and prompted to discuss her choices. Patient complained multiple times that there was something blocking her throat during this task, however, it was resolved after several deep abdominal breathing exercises and patient did not bring up the issue again. Lastly, patient was asked questions designed to evoke emotional response to practice maintenance of an appropriate pitch and volume. Noted mild diplophonia on 50% of evoked responses. Using the flow phonation technique to produce a voiced vowel "oo", patient was able to improve vocal  quality across tasks.       Assessment / Recommendations / Plan   Plan  Continue with current plan of care      Progression Toward Goals   Progression toward goals  Progressing toward goals       SLP Education - 04/05/19 0852    Education Details  Flow phonation/resonant voice, self-monitor    Person(s) Educated  Patient    Methods  Explanation    Comprehension  Verbalized understanding         SLP Long Term Goals - 02/15/19 1411      SLP LONG TERM GOAL #1   Title  The patient will be independent for abdominal breathing and breath support exercises.    Time  8    Period  Weeks    Status  New  Target Date  04/12/19      SLP LONG TERM GOAL #2   Title  The patient will minimize vocal tension via resonant voice therapy (or comparable technique) with min SLP cues with 80% accuracy.    Time  8    Period  Weeks    Status  New    Target Date  04/12/19      SLP LONG TERM GOAL #3   Title  The patient will maintain relaxed phonation / oral resonance for paragraph length recitation with 80% accuracy.    Time  8    Period  Weeks    Status  New    Target Date  04/12/19       Plan - 04/05/19 0853    Clinical Impression Statement  Patient is responding well to therapy. Patient demonstrated improvement in maintenance of airflow today during speech tasks. Patient demonstrated mild breath holding but responded to reminders to relax and use abdominal breathing techniques. In spontaneous speech, the patient demonstrated a general increase in volume compared to previous sessions. Patient demonstrated more self-awareness of the sound of her voice, especially vocal fatigue.    Speech Therapy Frequency  2x / week    Duration  Other (comment)    Treatment/Interventions  Patient/family education;Other (comment)    Potential to Achieve Goals  Good    Potential Considerations  Ability to learn/carryover information;Previous level of function;Co-morbidities;Severity of  impairments;Cooperation/participation level;Medical prognosis;Family/community support    SLP Home Exercise Plan  Provided    Consulted and Agree with Plan of Care  Patient       Patient will benefit from skilled therapeutic intervention in order to improve the following deficits and impairments:   Dysphonia    Problem List Patient Active Problem List   Diagnosis Date Noted  . Benign breast lumps 01/11/2019  . Gout 01/11/2019  . Hives 01/11/2019  . Lung disease 01/11/2019  . Migraine headache 01/11/2019  . Goals of care, counseling/discussion 12/27/2018  . Malignant neoplasm of upper lobe of left lung ([place]) 12/27/2018  . Night sweats 08/07/2016  . 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 Osteopenia of multiple sites 04/27/2016  . Left carpal tunnel syndrome 02/02/2016  . Carotid stenosis, asymptomatic, bilateral 12/29/2015  . Prediabetes 09/28/2015  . Primary osteoarthritis involving multiple joints 09/28/2015  . Knee pain 05/11/2015  . Chest tightness 03/24/2015  . Stool incontinence 03/24/2015  . Urinary frequency 03/04/2015  . Cervical spondylosis with radiculopathy 02/24/2015  . Pre-syncope 02/19/2015  . URI (upper respiratory infection) 02/15/2015  . Neck pain 12/25/2014  . Pelvic pain in female 11/16/2014  . Primary osteoarthritis of hip 08/05/2014  . Heel pain 06/01/2014  . Diarrhea 06/01/2014  . Health care maintenance 06/01/2014  . Right leg pain 03/30/2014  . Right arm pain 03/30/2014  . Sinusitis 03/30/2014  . Internal nasal lesion 11/12/2013  . Other specified disorders of nose and nasal sinuses 11/12/2013  . Chronic tension-type headache, intractable 10/30/2013  . Intractable migraine without aura and without status migrainosus 10/30/2013  . Occipital neuralgia of left side 10/30/2013  . IBS (irritable bowel syndrome) 09/03/2013  . Rib pain 08/04/2013  . Cervical radiculitis 08/01/2013  . Lumbar radiculitis 08/01/2013  . Palpitations 07/25/2013  .  Benign essential hypertension 05/28/2013  . Neuralgia, neuritis, and radiculitis, unspecified 05/28/2013  . Vitamin D deficiency 05/28/2013  . Elevated AFP 04/28/2013  . Abnormality of alpha-fetoprotein 04/28/2013  . Osteoporosis 11/12/2012  . Incomplete emptying of bladder 07/11/2012  . Prolapse of vaginal  vault after hysterectomy 07/11/2012  . Nonspecific (abnormal) findings on radiological and other examination of gastrointestinal tract 05/31/2012  . Bronchiectasis ([place]) 02/05/2012  . Sleep apnea 02/05/2012  . Degenerative disc disease, cervical 02/05/2012  . Hypercholesterolemia 02/05/2012  . GERD (gastroesophageal reflux disease) 02/05/2012  . Chronic headaches 02/05/2012   Leroy Sea, MS/CCC- SLP  Lou Miner 04/05/2019, 8:54 AM  [place] MAIN [address] [facility] SERVICES [address] [place], [place], [address] Phone: [phone]   Fax:  [phone]   Name: Jennifer Hodges MRN: [id number] Date of Birth: 25-Feb-1946

## 2019-04-08 ENCOUNTER — Ambulatory Visit: Payer: Medicare Other | Admitting: Speech Pathology

## 2019-04-08 ENCOUNTER — Other Ambulatory Visit: Payer: Self-pay

## 2019-04-08 DIAGNOSIS — R490 Dysphonia: Secondary | ICD-10-CM | POA: Diagnosis not present

## 2019-04-09 ENCOUNTER — Encounter: Payer: Self-pay | Admitting: Speech Pathology

## 2019-04-09 NOTE — Therapy (Signed)
[place] MAIN [facility] SERVICES [address] [place], [place], [address] Phone: [phone]   Fax:  [phone]  Speech Language Pathology Treatment  Patient Details  Name: Jennifer Hodges MRN: [id number] Date of Birth: Nov 03, 1945 Referring Provider (SLP): Dr. Pryor Ochoa   Encounter Date: 04/08/2019  End of Session - 04/09/19 1003    Visit Number  9    Number of Visits  17    Date for SLP Re-Evaluation  04/12/19    Authorization Type  Medicare    Authorization Time Period  Start 02/14/2019    Authorization - Visit Number  9    Authorization - Number of Visits  10    SLP Start Time  1500    SLP Stop Time   1550    SLP Time Calculation (min)  50 min    Activity Tolerance  Patient tolerated treatment well       Past Medical History:  Diagnosis Date  . Anemia   . Asthma    No Inhalers--Dr. [name] will order as needed  . Bronchiectasis (HCC)    mild  . Chronic headaches     followed by [facility] migraines  . COPD (chronic obstructive pulmonary disease) ([place])   . DDD (degenerative disc disease), lumbar   . Diabetes mellitus without complication [facility])    "doctor says I no longer have diabetes"    . Diverticulosis   . Family history of adverse reaction to anesthesia    PONV  . Gall stones    history of  . GERD (gastroesophageal reflux disease)    EGD 8/09- non bleeding erosive gastritis, documentd esophageal ulcerations.   . Hiatal hernia   . History of pneumonia   . Hypercholesterolemia   . IBS (irritable bowel syndrome)   . Malignant neoplasm of upper lobe of left lung ([place]) 12/27/2018  . Murmur   . Osteoarthritis    lumbar disc disease, left hip  . PONV (postoperative nausea and vomiting)    "Only with last hip and I believe it was due to the morphine"   . Sleep apnea    cpap asked to bring mask and tubing  . Weakness of right side of body     Past Surgical History:  Procedure Laterality Date  . ABDOMINAL HYSTERECTOMY   age 73  . ANTERIOR CERVICAL DECOMP/DISCECTOMY FUSION N/A 02/24/2015   Procedure: CERVICAL FOUR-FIVE, CERVICAL FIVE-SIX, CERVICAL SIX-SEVEN ANTERIOR CERVICAL DECOMPRESSION/DISCECTOMY FUSION ;  Surgeon: [name], MD;  Location: [place] NEURO ORS;  Service: Neurosurgery;  Laterality: N/A;  C45 C56 C67 anterior cervical decompression with fusion interbody prosthesis plating and bonegraft  . APPENDECTOMY    . BACK SURGERY     4th lumbar fusion  . BLADDER SURGERY N/A    with vaginal wall repair  . BREAST CYST ASPIRATION Bilateral    neg  . BREAST SURGERY Bilateral    cyst removed and reduction  . CARDIAC CATHETERIZATION  2014  . CARPAL TUNNEL RELEASE Right 02/11/2016   Procedure: CARPAL TUNNEL RELEASE;  Surgeon: [name], MD;  Location: [facility] ORS;  Service: Orthopedics;  Laterality: Right;  . CATARACT EXTRACTION W/ INTRAOCULAR LENS IMPLANT Bilateral 2015  . CHOLECYSTECTOMY    . EUS N/A 05/31/2012   Procedure: UPPER ENDOSCOPIC ULTRASOUND (EUS) LINEAR;  Surgeon: [name], MD;  [facility]: [facility];  Service: Endoscopy;  Laterality: N/A;  . EXCISIONAL HEMORRHOIDECTOMY    . JOINT REPLACEMENT Bilateral   . KNEE ARTHROSCOPY WITH LATERAL MENISECTOMY Right  07/07/2015   Procedure: KNEE ARTHROSCOPY WITH LATERAL MENISECTOMY, PARTIAL SYNOVECTOMY;  Surgeon: [name], MD;  Location: [facility] ORS;  Service: Orthopedics;  Laterality: Right;  . LUMBAR LAMINECTOMY    . PORTA CATH INSERTION N/A 01/07/2019   Procedure: PORTA CATH INSERTION;  Surgeon: [name], MD;  Location: [place] CV LAB;  Service: Cardiovascular;  Laterality: N/A;  . REDUCTION MAMMAPLASTY  1990  . RIGHT OOPHORECTOMY    . TOTAL HIP ARTHROPLASTY Left 05/01/2014   Dr. [name]  . TOTAL HIP ARTHROPLASTY Right 08/05/2014   Procedure: TOTAL HIP ARTHROPLASTY ANTERIOR APPROACH;  Surgeon: [name], MD;  Location: [facility] ORS;  Service: Orthopedics;  Laterality: Right;  . ULNAR NERVE TRANSPOSITION Right 02/11/2016    Procedure: ULNAR NERVE DECOMPRESSION/TRANSPOSITION;  Surgeon: [name], MD;  Location: [facility] ORS;  Service: Orthopedics;  Laterality: Right;  Marland Kitchen VIDEO BRONCHOSCOPY WITH ENDOBRONCHIAL ULTRASOUND Left 12/19/2018   Procedure: VIDEO BRONCHOSCOPY WITH ENDOBRONCHIAL ULTRASOUND, LEFT, SLEEP APNEA;  Surgeon: [name], MD;  Location: [facility] ORS;  Service: Thoracic;  Laterality: Left;    There were no vitals filed for this visit.  Subjective Assessment - 04/09/19 1002    Subjective  Patient was engaged in the session.            ADULT SLP TREATMENT - 04/09/19 0001      General Information   Behavior/Cognition  Alert;Cooperative;Pleasant mood    HPI  Jennifer Hodges is 74 year old woman with vocal quality change since May 2020.  The patient has a history of COPD and recent diagnosis of lung cancer, currently undergoing chemoradiation.  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       Treatment Provided   Treatment provided  Cognitive-Linquistic      Pain Assessment   Pain Assessment  No/denies pain      Cognitive-Linquistic Treatment   Treatment focused on  Voice    Skilled Treatment  Patient was educated on the importance on relaxation in the larynx. Patient again complained of residue in the vocal tract and feels she is unable to clear it. When reminded to relax the extrinsic/intrinsic laryngeal musculature, patient no longer complains of this "full" feeling but does continue to clear her throat. Given maximal visual and verbal cues to relax the laryngeal musculature and speak with a strong voice on short sentences, patient maintained a strong voice without diplophonia on 70% of utterances during a spontaneous speech task. On a reading task, patient read 10 paragraphs with minimal cues to use her loud voice. Diplophonia noted on 20% of reading paragraphs. Patient reported laughing helps relax her throat.       Assessment / Recommendations / Plan   Plan  Continue with  current plan of care      Progression Toward Goals   Progression toward goals  Progressing toward goals       SLP Education - 04/09/19 1002    Education Details  Relaxed phonation    Person(s) Educated  Patient    Methods  Explanation    Comprehension  Verbalized understanding         SLP Long Term Goals - 02/15/19 1411      SLP LONG TERM GOAL #1   Title  The patient will be independent for abdominal breathing and breath support exercises.    Time  8    Period  Weeks    Status  New    Target Date  04/12/19      SLP LONG TERM GOAL #  2   Title  The patient will minimize vocal tension via resonant voice therapy (or comparable technique) with min SLP cues with 80% accuracy.    Time  8    Period  Weeks    Status  New    Target Date  04/12/19      SLP LONG TERM GOAL #3   Title  The patient will maintain relaxed phonation / oral resonance for paragraph length recitation with 80% accuracy.    Time  8    Period  Weeks    Status  New    Target Date  04/12/19       Plan - 04/09/19 1004    Clinical Impression Statement  Patient is responding well to therapy and reports that family members and friends are telling her that her voice is louder. Patient demonstrated improvement in maintenance of airflow today during speech tasks. In spontaneous speech, the patient demonstrated a general increase in volume compared to previous sessions. Patient demonstrated more self-awareness of the sound of her voice, especially vocal fatigue.    Speech Therapy Frequency  2x / week    Duration  Other (comment)    Treatment/Interventions  Patient/family education;Other (comment)    Potential Considerations  Ability to learn/carryover information;Previous level of function;Co-morbidities;Severity of impairments;Cooperation/participation level;Medical prognosis;Family/community support    SLP Home Exercise Plan  Provided    Consulted and Agree with Plan of Care  Patient       Patient will benefit  from skilled therapeutic intervention in order to improve the following deficits and impairments:   Dysphonia    Problem List Patient Active Problem List   Diagnosis Date Noted  . Benign breast lumps 01/11/2019  . Gout 01/11/2019  . Hives 01/11/2019  . Lung disease 01/11/2019  . Migraine headache 01/11/2019  . Goals of care, counseling/discussion 12/27/2018  . Malignant neoplasm of upper lobe of left lung ([place]) 12/27/2018  . Night sweats 08/07/2016  . 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 Osteopenia of multiple sites 04/27/2016  . Left carpal tunnel syndrome 02/02/2016  . Carotid stenosis, asymptomatic, bilateral 12/29/2015  . Prediabetes 09/28/2015  . Primary osteoarthritis involving multiple joints 09/28/2015  . Knee pain 05/11/2015  . Chest tightness 03/24/2015  . Stool incontinence 03/24/2015  . Urinary frequency 03/04/2015  . Cervical spondylosis with radiculopathy 02/24/2015  . Pre-syncope 02/19/2015  . URI (upper respiratory infection) 02/15/2015  . Neck pain 12/25/2014  . Pelvic pain in female 11/16/2014  . Primary osteoarthritis of hip 08/05/2014  . Heel pain 06/01/2014  . Diarrhea 06/01/2014  . Health care maintenance 06/01/2014  . Right leg pain 03/30/2014  . Right arm pain 03/30/2014  . Sinusitis 03/30/2014  . Internal nasal lesion 11/12/2013  . Other specified disorders of nose and nasal sinuses 11/12/2013  . Chronic tension-type headache, intractable 10/30/2013  . Intractable migraine without aura and without status migrainosus 10/30/2013  . Occipital neuralgia of left side 10/30/2013  . IBS (irritable bowel syndrome) 09/03/2013  . Rib pain 08/04/2013  . Cervical radiculitis 08/01/2013  . Lumbar radiculitis 08/01/2013  . Palpitations 07/25/2013  . Benign essential hypertension 05/28/2013  . Neuralgia, neuritis, and radiculitis, unspecified 05/28/2013  . Vitamin D deficiency 05/28/2013  . Elevated AFP 04/28/2013  . Abnormality of alpha-fetoprotein  04/28/2013  . Osteoporosis 11/12/2012  . Incomplete emptying of bladder 07/11/2012  . Prolapse of vaginal vault after hysterectomy 07/11/2012  . Nonspecific (abnormal) findings on radiological and other examination of gastrointestinal tract 05/31/2012  . Bronchiectasis ([place]) 02/05/2012  .  Sleep apnea 02/05/2012  . Degenerative disc disease, cervical 02/05/2012  . Hypercholesterolemia 02/05/2012  . GERD (gastroesophageal reflux disease) 02/05/2012  . Chronic headaches 02/05/2012   Leroy Sea, MS/CCC- SLP  Lou Miner 04/09/2019, 10:05 AM  [place] MAIN [facility] SERVICES [address] [place], [place], [address] Phone: [phone]   Fax:  [phone]   Name: Jennifer Hodges MRN: [id number] Date of Birth: 10-11-1945

## 2019-04-11 ENCOUNTER — Ambulatory Visit: Payer: Medicare Other | Admitting: Speech Pathology

## 2019-04-11 ENCOUNTER — Other Ambulatory Visit: Payer: Self-pay

## 2019-04-11 DIAGNOSIS — R490 Dysphonia: Secondary | ICD-10-CM | POA: Diagnosis not present

## 2019-04-12 ENCOUNTER — Telehealth: Payer: Self-pay | Admitting: *Deleted

## 2019-04-12 ENCOUNTER — Encounter: Payer: Self-pay | Admitting: Speech Pathology

## 2019-04-12 NOTE — Therapy (Signed)
[place] MAIN [facility] SERVICES [address] [place], [place], [address] Phone: [phone]   Fax:  [phone]  Speech Language Pathology Treatment/Progress Note  Speech Therapy Progress Note   Dates of reporting period  02/14/2019   to   04/11/2019   Patient Details  Name: Jennifer Hodges MRN: [id number] Date of Birth: 10-05-1945 Referring Provider (SLP): Dr. Pryor Ochoa   Encounter Date: 04/11/2019  End of Session - 04/12/19 1129    Visit Number  10    Number of Visits  17    Date for SLP Re-Evaluation  04/12/19    Authorization Type  Medicare    Authorization Time Period  Start 02/14/2019    Authorization - Visit Number  10    Authorization - Number of Visits  10    SLP Start Time  1400    SLP Stop Time   1450    SLP Time Calculation (min)  50 min    Activity Tolerance  Patient tolerated treatment well       Past Medical History:  Diagnosis Date  . Anemia   . Asthma    No Inhalers--Dr. [name] will order as needed  . Bronchiectasis (HCC)    mild  . Chronic headaches     followed by [facility] migraines  . COPD (chronic obstructive pulmonary disease) ([place])   . DDD (degenerative disc disease), lumbar   . Diabetes mellitus without complication [facility])    "doctor says I no longer have diabetes"    . Diverticulosis   . Family history of adverse reaction to anesthesia    PONV  . Gall stones    history of  . GERD (gastroesophageal reflux disease)    EGD 8/09- non bleeding erosive gastritis, documentd esophageal ulcerations.   . Hiatal hernia   . History of pneumonia   . Hypercholesterolemia   . IBS (irritable bowel syndrome)   . Malignant neoplasm of upper lobe of left lung ([place]) 12/27/2018  . Murmur   . Osteoarthritis    lumbar disc disease, left hip  . PONV (postoperative nausea and vomiting)    "Only with last hip and I believe it was due to the morphine"   . Sleep apnea    cpap asked to bring mask and tubing  . Weakness  of right side of body     Past Surgical History:  Procedure Laterality Date  . ABDOMINAL HYSTERECTOMY  age 62  . ANTERIOR CERVICAL DECOMP/DISCECTOMY FUSION N/A 02/24/2015   Procedure: CERVICAL FOUR-FIVE, CERVICAL FIVE-SIX, CERVICAL SIX-SEVEN ANTERIOR CERVICAL DECOMPRESSION/DISCECTOMY FUSION ;  Surgeon: [name], MD;  Location: [place] [facility]URO ORS;  Service: Neurosurgery;  Laterality: N/A;  C45 C56 C67 anterior cervical decompression with fusion interbody prosthesis plating and bonegraft  . APPENDECTOMY    . BACK SURGERY     4th lumbar fusion  . BLADDER SURGERY N/A    with vaginal wall repair  . BREAST CYST ASPIRATION Bilateral    neg  . BREAST SURGERY Bilateral    cyst removed and reduction  . CARDIAC CATHETERIZATION  2014  . CARPAL TUNNEL RELEASE Right 02/11/2016   Procedure: CARPAL TUNNEL RELEASE;  Surgeon: [name], MD;  Location: [facility] ORS;  Service: Orthopedics;  Laterality: Right;  . CATARACT EXTRACTION W/ INTRAOCULAR LENS IMPLANT Bilateral 2015  . CHOLECYSTECTOMY    . EUS N/A 05/31/2012   Procedure: UPPER ENDOSCOPIC ULTRASOUND (EUS) LINEAR;  Surgeon: [name], MD;  [facility]: [facility];  Service: Endoscopy;  Laterality:  N/A;  . EXCISIONAL HEMORRHOIDECTOMY    . JOINT REPLACEMENT Bilateral   . KNEE ARTHROSCOPY WITH LATERAL MENISECTOMY Right 07/07/2015   Procedure: KNEE ARTHROSCOPY WITH LATERAL MENISECTOMY, PARTIAL SYNOVECTOMY;  Surgeon: [name], MD;  Location: [facility] ORS;  Service: Orthopedics;  Laterality: Right;  . LUMBAR LAMINECTOMY    . PORTA CATH INSERTION N/A 01/07/2019   Procedure: PORTA CATH INSERTION;  Surgeon: [name], MD;  Location: [place] CV LAB;  Service: Cardiovascular;  Laterality: N/A;  . REDUCTION MAMMAPLASTY  1990  . RIGHT OOPHORECTOMY    . TOTAL HIP ARTHROPLASTY Left 05/01/2014   Dr. [name]  . TOTAL HIP ARTHROPLASTY Right 08/05/2014   Procedure: TOTAL HIP ARTHROPLASTY ANTERIOR APPROACH;  Surgeon: [name], MD;  Location:  [facility] ORS;  Service: Orthopedics;  Laterality: Right;  . ULNAR NERVE TRANSPOSITION Right 02/11/2016   Procedure: ULNAR NERVE DECOMPRESSION/TRANSPOSITION;  Surgeon: [name], MD;  Location: [facility] ORS;  Service: Orthopedics;  Laterality: Right;  Marland Kitchen VIDEO BRONCHOSCOPY WITH ENDOBRONCHIAL ULTRASOUND Left 12/19/2018   Procedure: VIDEO BRONCHOSCOPY WITH ENDOBRONCHIAL ULTRASOUND, LEFT, SLEEP APNEA;  Surgeon: [name], MD;  Location: [facility] ORS;  Service: Thoracic;  Laterality: Left;    There were no vitals filed for this visit.  Subjective Assessment - 04/12/19 1129    Subjective  Patient was engaged in the session.            ADULT SLP TREATMENT - 04/12/19 0001      General Information   Behavior/Cognition  Alert;Cooperative;Pleasant mood    HPI  Jennifer Hodges is 74 year old woman with vocal quality change since May 2020.  The patient has a history of COPD and recent diagnosis of lung cancer, currently undergoing chemoradiation.  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       Treatment Provided   Treatment provided  Cognitive-Linquistic      Pain Assessment   Pain Assessment  No/denies pain      Cognitive-Linquistic Treatment   Treatment focused on  Voice    Skilled Treatment  The patient was provided with written and verbal teaching regarding vocal hygiene.  The patient was provided with written and verbal teaching regarding neck, shoulder, tongue, and throat stretches exercises to promote relaxed phonation. The patient states that talking leaves her exhausted and sore. The patient was provided with written and verbal teaching regarding breath support exercises.  She demonstrates much less clavicular breathing and improved airflow.  Patient instructed in flow phonation through skill level 1: establish airflow release: Unarticulated: significant improvement in duration of airflow.  Articulated (unvoiced): patient demonstrates improving mastery of maintaining airflow  while moving articulators.  Skill level 2-  Airflow + Voicing: Unarticulated:  successful .  Articulated:  having best response to imitating therapist with meaningful phrases.  Patient instructed in initial steps of resonant voice therapy, doing well with hum and initial /m/ words.  Patient able to generate lower pitched/clear vocal quality with "oo" and in reading with 50% accuracy and conversation with 35% accuracy.  The patient felt she was having a more difficult time lowering her pitch.      Assessment / Recommendations / Plan   Plan  Continue with current plan of care      Progression Toward Goals   Progression toward goals  Progressing toward goals       SLP Education - 04/12/19 1129    Education Details  Relaxed phonation, lower pitch    Person(s) Educated  Patient    Methods  Explanation    Comprehension  Verbalized understanding         SLP Long Term Goals - 04/12/19 1132      SLP LONG TERM GOAL #1   Title  The patient will be independent for abdominal breathing and breath support exercises.    Status  Partially Met      SLP LONG TERM GOAL #2   Title  The patient will minimize vocal tension via resonant voice therapy (or comparable technique) with min SLP cues with 80% accuracy.    Status  Partially Met      SLP LONG TERM GOAL #3   Title  The patient will maintain relaxed phonation / oral resonance for paragraph length recitation with 80% accuracy.    Status  Partially Met       Plan - 04/12/19 1130    Clinical Impression Statement  Patient is responding well to therapy and reports that family members and friends are telling her that her voice is louder. Patient demonstrated improvement in maintenance of airflow today during speech tasks. She had more difficulty lowering her pitch for optimal vocal quality.  In spontaneous speech, the patient demonstrated a general increase in volume compared to previous sessions. Patient demonstrated more self-awareness of the sound of  her voice, especially vocal fatigue.    Speech Therapy Frequency  2x / week    Duration  Other (comment)    Treatment/Interventions  Patient/family education;Other (comment)    Potential Considerations  Ability to learn/carryover information;Previous level of function;Co-morbidities;Severity of impairments;Cooperation/participation level;Medical prognosis;Family/community support    SLP Home Exercise Plan  Provided    Consulted and Agree with Plan of Care  Patient       Patient will benefit from skilled therapeutic intervention in order to improve the following deficits and impairments:   Dysphonia    Problem List Patient Active Problem List   Diagnosis Date Noted  . Benign breast lumps 01/11/2019  . Gout 01/11/2019  . Hives 01/11/2019  . Lung disease 01/11/2019  . Migraine headache 01/11/2019  . Goals of care, counseling/discussion 12/27/2018  . Malignant neoplasm of upper lobe of left lung ([place]) 12/27/2018  . Night sweats 08/07/2016  . 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 Osteopenia of multiple sites 04/27/2016  . Left carpal tunnel syndrome 02/02/2016  . Carotid stenosis, asymptomatic, bilateral 12/29/2015  . Prediabetes 09/28/2015  . Primary osteoarthritis involving multiple joints 09/28/2015  . Knee pain 05/11/2015  . Chest tightness 03/24/2015  . Stool incontinence 03/24/2015  . Urinary frequency 03/04/2015  . Cervical spondylosis with radiculopathy 02/24/2015  . Pre-syncope 02/19/2015  . URI (upper respiratory infection) 02/15/2015  . Neck pain 12/25/2014  . Pelvic pain in female 11/16/2014  . Primary osteoarthritis of hip 08/05/2014  . Heel pain 06/01/2014  . Diarrhea 06/01/2014  . Health care maintenance 06/01/2014  . Right leg pain 03/30/2014  . Right arm pain 03/30/2014  . Sinusitis 03/30/2014  . Internal nasal lesion 11/12/2013  . Other specified disorders of nose and nasal sinuses 11/12/2013  . Chronic tension-type headache, intractable 10/30/2013   . Intractable migraine without aura and without status migrainosus 10/30/2013  . Occipital neuralgia of left side 10/30/2013  . IBS (irritable bowel syndrome) 09/03/2013  . Rib pain 08/04/2013  . Cervical radiculitis 08/01/2013  . Lumbar radiculitis 08/01/2013  . Palpitations 07/25/2013  . Benign essential hypertension 05/28/2013  . Neuralgia, neuritis, and radiculitis, unspecified 05/28/2013  . Vitamin D deficiency 05/28/2013  . Elevated AFP 04/28/2013  . Abnormality of  alpha-fetoprotein 04/28/2013  . Osteoporosis 11/12/2012  . Incomplete emptying of bladder 07/11/2012  . Prolapse of vaginal vault after hysterectomy 07/11/2012  . Nonspecific (abnormal) findings on radiological and other examination of gastrointestinal tract 05/31/2012  . Bronchiectasis ([place]) 02/05/2012  . Sleep apnea 02/05/2012  . Degenerative disc disease, cervical 02/05/2012  . Hypercholesterolemia 02/05/2012  . GERD (gastroesophageal reflux disease) 02/05/2012  . Chronic headaches 02/05/2012   Leroy Sea, MS/CCC- SLP  Lou Miner 04/12/2019, 11:33 AM  [place] MAIN [facility] SERVICES [address] [place], [place], [address] Phone: [phone]   Fax:  [phone]   Name: Jennifer Hodges MRN: [id number] Date of Birth: January 12, 1946

## 2019-04-12 NOTE — Telephone Encounter (Signed)
Patient went to see Dr Ellison Hughs today and was told that her lungs are clear, but she is coughing up green phlegm and he told her to let Dr Janese Banks know this.

## 2019-04-13 ENCOUNTER — Other Ambulatory Visit: Payer: Self-pay | Admitting: Oncology

## 2019-04-13 MED ORDER — AMOXICILLIN-POT CLAVULANATE 875-125 MG PO TABS: 1.0000 | ORAL_TABLET | Freq: Two times a day (BID) | ORAL | 0 refills | Status: DC

## 2019-04-16 ENCOUNTER — Encounter: Payer: Self-pay | Admitting: Speech Pathology

## 2019-04-16 ENCOUNTER — Ambulatory Visit: Payer: Medicare Other | Attending: Otolaryngology | Admitting: Speech Pathology

## 2019-04-16 ENCOUNTER — Other Ambulatory Visit: Payer: Self-pay

## 2019-04-16 DIAGNOSIS — R490 Dysphonia: Secondary | ICD-10-CM | POA: Diagnosis not present

## 2019-04-16 NOTE — Therapy (Signed)
[place] MAIN [facility] SERVICES [address] [place], [place], [address] Phone: [phone]   Fax:  [phone]  Speech Language Pathology Treatment  Patient Details  Name: Jennifer Hodges MRN: [id number] Date of Birth: 15-Sep-1945 Referring Provider (SLP): Dr. Pryor Ochoa   Encounter Date: 04/16/2019  End of Session - 04/16/19 1618    Visit Number  11    Number of Visits  17    Date for SLP Re-Evaluation  05/14/19    Authorization Type  Medicare    Authorization Time Period  Start 04/16/2019    Authorization - Visit Number  1    Authorization - Number of Visits  10    SLP Start Time  0200    SLP Stop Time   0250    SLP Time Calculation (min)  50 min    Activity Tolerance  Patient tolerated treatment well       Past Medical History:  Diagnosis Date  . Anemia   . Asthma    No Inhalers--Dr. [name] will order as needed  . Bronchiectasis (HCC)    mild  . Chronic headaches     followed by [facility] migraines  . COPD (chronic obstructive pulmonary disease) ([place])   . DDD (degenerative disc disease), lumbar   . Diabetes mellitus without complication [facility])    "doctor says I no longer have diabetes"    . Diverticulosis   . Family history of adverse reaction to anesthesia    PONV  . Gall stones    history of  . GERD (gastroesophageal reflux disease)    EGD 8/09- non bleeding erosive gastritis, documentd esophageal ulcerations.   . Hiatal hernia   . History of pneumonia   . Hypercholesterolemia   . IBS (irritable bowel syndrome)   . Malignant neoplasm of upper lobe of left lung ([place]) 12/27/2018  . Murmur   . Osteoarthritis    lumbar disc disease, left hip  . PONV (postoperative nausea and vomiting)    "Only with last hip and I believe it was due to the morphine"   . Sleep apnea    cpap asked to bring mask and tubing  . Weakness of right side of body     Past Surgical History:  Procedure Laterality Date  . ABDOMINAL HYSTERECTOMY   age 1  . ANTERIOR CERVICAL DECOMP/DISCECTOMY FUSION N/A 02/24/2015   Procedure: CERVICAL FOUR-FIVE, CERVICAL FIVE-SIX, CERVICAL SIX-SEVEN ANTERIOR CERVICAL DECOMPRESSION/DISCECTOMY FUSION ;  Surgeon: [name], MD;  Location: [place] NEURO ORS;  Service: Neurosurgery;  Laterality: N/A;  C45 C56 C67 anterior cervical decompression with fusion interbody prosthesis plating and bonegraft  . APPENDECTOMY    . BACK SURGERY     4th lumbar fusion  . BLADDER SURGERY N/A    with vaginal wall repair  . BREAST CYST ASPIRATION Bilateral    neg  . BREAST SURGERY Bilateral    cyst removed and reduction  . CARDIAC CATHETERIZATION  2014  . CARPAL TUNNEL RELEASE Right 02/11/2016   Procedure: CARPAL TUNNEL RELEASE;  Surgeon: [name], MD;  Location: [facility] ORS;  Service: Orthopedics;  Laterality: Right;  . CATARACT EXTRACTION W/ INTRAOCULAR LENS IMPLANT Bilateral 2015  . CHOLECYSTECTOMY    . EUS N/A 05/31/2012   Procedure: UPPER ENDOSCOPIC ULTRASOUND (EUS) LINEAR;  Surgeon: [name], MD;  Location: [facility];  Service: Endoscopy;  Laterality: N/A;  . EXCISIONAL HEMORRHOIDECTOMY    . JOINT REPLACEMENT Bilateral   . KNEE ARTHROSCOPY WITH LATERAL MENISECTOMY Right  07/07/2015   Procedure: KNEE ARTHROSCOPY WITH LATERAL MENISECTOMY, PARTIAL SYNOVECTOMY;  Surgeon: [name], MD;  Location: [facility] ORS;  Service: Orthopedics;  Laterality: Right;  . LUMBAR LAMINECTOMY    . PORTA CATH INSERTION N/A 01/07/2019   Procedure: PORTA CATH INSERTION;  Surgeon: [name], MD;  Location: [place] CV LAB;  Service: Cardiovascular;  Laterality: N/A;  . REDUCTION MAMMAPLASTY  1990  . RIGHT OOPHORECTOMY    . TOTAL HIP ARTHROPLASTY Left 05/01/2014   Dr. [name]  . TOTAL HIP ARTHROPLASTY Right 08/05/2014   Procedure: TOTAL HIP ARTHROPLASTY ANTERIOR APPROACH;  Surgeon: [name], MD;  Location: [facility] ORS;  Service: Orthopedics;  Laterality: Right;  . ULNAR NERVE TRANSPOSITION Right 02/11/2016    Procedure: ULNAR NERVE DECOMPRESSION/TRANSPOSITION;  Surgeon: [name], MD;  Location: [facility] ORS;  Service: Orthopedics;  Laterality: Right;  Marland Kitchen VIDEO BRONCHOSCOPY WITH ENDOBRONCHIAL ULTRASOUND Left 12/19/2018   Procedure: VIDEO BRONCHOSCOPY WITH ENDOBRONCHIAL ULTRASOUND, LEFT, SLEEP APNEA;  Surgeon: [name], MD;  Location: [facility] ORS;  Service: Thoracic;  Laterality: Left;    There were no vitals filed for this visit.  Subjective Assessment - 04/16/19 1615    Subjective  Patient was engaged in the session but complained of exhaustion.             SLP Education - 04/16/19 1617    Education Details  Relaxed phonation    Person(s) Educated  Patient    Methods  Explanation    Comprehension  Verbalized understanding         SLP Long Term Goals - 04/17/19 4098      SLP LONG TERM GOAL #1   Title  The patient will be independent for abdominal breathing and breath support exercises.    Time  4    Period  Weeks    Status  Partially Met    Target Date  05/14/19      SLP LONG TERM GOAL #2   Title  The patient will minimize vocal tension via resonant voice therapy (or comparable technique) with min SLP cues with 80% accuracy.    Time  4    Period  Weeks    Status  Partially Met    Target Date  05/14/19      SLP LONG TERM GOAL #3   Title  The patient will maintain relaxed phonation / oral resonance for paragraph length recitation with 80% accuracy.    Time  4    Period  Weeks    Status  Partially Met    Target Date  05/14/19       Plan - 04/16/19 1619    Clinical Impression Statement  Patient is responding well to therapy and reports that family members and friends are telling her that her voice is louder. Patient demonstrated improvement in maintenance of airflow today during speech tasks. She had more difficulty lowering her pitch for optimal vocal quality.  In spontaneous speech, the patient demonstrated a general increase in volume compared to previous sessions.  Patient demonstrated more self-awareness of the sound of her voice, especially vocal fatigue.Patient reported a bout of bronchitis which is causing her to have difficulty with speaking loudly/causing fatigue.    Speech Therapy Frequency  2x / week    Duration  Other (comment)    Treatment/Interventions  Patient/family education;Other (comment)    Potential Considerations  Ability to learn/carryover information;Previous level of function;Co-morbidities;Severity of impairments;Cooperation/participation level;Medical prognosis;Family/community support    SLP Home Exercise Plan  Provided  Consulted and Agree with Plan of Care  Patient       Patient will benefit from skilled therapeutic intervention in order to improve the following deficits and impairments:   Dysphonia - Plan: SLP plan of care cert/re-cert    Problem List Patient Active Problem List   Diagnosis Date Noted  . Benign breast lumps 01/11/2019  . Gout 01/11/2019  . Hives 01/11/2019  . Lung disease 01/11/2019  . Migraine headache 01/11/2019  . Goals of care, counseling/discussion 12/27/2018  . Malignant neoplasm of upper lobe of left lung ([place]) 12/27/2018  . Night sweats 08/07/2016  . 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 Osteopenia of multiple sites 04/27/2016  . Left carpal tunnel syndrome 02/02/2016  . Carotid stenosis, asymptomatic, bilateral 12/29/2015  . Prediabetes 09/28/2015  . Primary osteoarthritis involving multiple joints 09/28/2015  . Knee pain 05/11/2015  . Chest tightness 03/24/2015  . Stool incontinence 03/24/2015  . Urinary frequency 03/04/2015  . Cervical spondylosis with radiculopathy 02/24/2015  . Pre-syncope 02/19/2015  . URI (upper respiratory infection) 02/15/2015  . Neck pain 12/25/2014  . Pelvic pain in female 11/16/2014  . Primary osteoarthritis of hip 08/05/2014  . Heel pain 06/01/2014  . Diarrhea 06/01/2014  . Health care maintenance 06/01/2014  . Right leg pain 03/30/2014  .  Right arm pain 03/30/2014  . Sinusitis 03/30/2014  . Internal nasal lesion 11/12/2013  . Other specified disorders of nose and nasal sinuses 11/12/2013  . Chronic tension-type headache, intractable 10/30/2013  . Intractable migraine without aura and without status migrainosus 10/30/2013  . Occipital neuralgia of left side 10/30/2013  . IBS (irritable bowel syndrome) 09/03/2013  . Rib pain 08/04/2013  . Cervical radiculitis 08/01/2013  . Lumbar radiculitis 08/01/2013  . Palpitations 07/25/2013  . Benign essential hypertension 05/28/2013  . Neuralgia, neuritis, and radiculitis, unspecified 05/28/2013  . Vitamin D deficiency 05/28/2013  . Elevated AFP 04/28/2013  . Abnormality of alpha-fetoprotein 04/28/2013  . Osteoporosis 11/12/2012  . Incomplete emptying of bladder 07/11/2012  . Prolapse of vaginal vault after hysterectomy 07/11/2012  . Nonspecific (abnormal) findings on radiological and other examination of gastrointestinal tract 05/31/2012  . Bronchiectasis ([place]) 02/05/2012  . Sleep apnea 02/05/2012  . Degenerative disc disease, cervical 02/05/2012  . Hypercholesterolemia 02/05/2012  . GERD (gastroesophageal reflux disease) 02/05/2012  . Chronic headaches 02/05/2012    Lou Miner 04/17/2019, 8:29 AM  [place] MAIN [facility] SERVICES [address] [place], [place], [address] Phone: [phone]   Fax:  [phone]   Name: SARAHJANE MATHERLY MRN: [id number] Date of Birth: [birth year]

## 2019-04-17 ENCOUNTER — Encounter: Payer: Self-pay | Admitting: Radiation Oncology

## 2019-04-17 ENCOUNTER — Ambulatory Visit
Admission: RE | Admit: 2019-04-17 | Discharge: 2019-04-17 | Disposition: A | Discharge status: Home / Self Care | Payer: Medicare Other | Source: Ambulatory Visit | Attending: Radiation Oncology | Admitting: Radiation Oncology

## 2019-04-17 ENCOUNTER — Other Ambulatory Visit: Payer: Self-pay

## 2019-04-17 VITALS — BP 125/67 | HR 81 | Temp 96.9°F | Resp 18 | Wt 167.7 lb

## 2019-04-17 DIAGNOSIS — C3412 Malignant neoplasm of upper lobe, left bronchus or lung: Secondary | ICD-10-CM | POA: Insufficient documentation

## 2019-04-17 DIAGNOSIS — Z923 Personal history of irradiation: Secondary | ICD-10-CM | POA: Diagnosis not present

## 2019-04-17 NOTE — Progress Notes (Signed)
Radiation Oncology Follow up Note  Name: Jennifer Hodges   Date:   04/17/2019 MRN:  [id number] DOB: 22-Mar-1945    This 74 y.o. female presents to the clinic today for 1 month follow-up status post concurrent chemoradiation therapy for stage IIIa (T1b N2 M0) non-small cell lung cancer favoring adenocarcinoma.Marland Kitchen  REFERRING PROVIDER: [name], MD  HPI: Patient is a 74 year old female now at 1 month having completed concurrent chemoradiation therapy for stage IIIa non-small cell lung cancer seen today in routine follow-up she is improving she states she is having no problems with breathing cough hemoptysis or chest tightness..  She is currently onmaintenance durvalumab and tolerating that well.  She had a recent CT scan showing good response to therapy with reduction pulmonary lesions and thoracic adenopathy.  COMPLICATIONS OF TREATMENT: none  FOLLOW UP COMPLIANCE: keeps appointments   PHYSICAL EXAM:  BP 125/67 (BP Location: Left Arm, Patient Position: Sitting)   Pulse 81   Temp (!) 96.9 F (36.1 C) (Tympanic)   Resp 18   Wt 167 lb 11.2 oz (76.1 kg)   BMI 30.67 kg/m  Well-developed well-nourished patient in NAD. HEENT reveals PERLA, EOMI, discs not visualized.  Oral cavity is clear. No oral mucosal lesions are identified. Neck is clear without evidence of cervical or supraclavicular adenopathy. Lungs are clear to A&P. Cardiac examination is essentially unremarkable with regular rate and rhythm without murmur rub or thrill. Abdomen is benign with no organomegaly or masses noted. Motor sensory and DTR levels are equal and symmetric in the upper and lower extremities. Cranial nerves II through XII are grossly intact. Proprioception is intact. No peripheral adenopathy or edema is identified. No motor or sensory levels are noted. Crude visual fields are within normal range.  RADIOLOGY RESULTS: CT scan reviewed compatible with above-stated findings  PLAN: Present time patient is doing well.   She is currently onmaintenance durvalumab and tolerating that well.  She is complaining of some side pain not sure the etiology that nothing on CT scan would account for that.  She is also asking questions about some alternative therapy have asked her to redirect that to Dr. Janese Banks.  I have asked to see her back in 3 to 4 months for follow-up.  Patient knows to call with any concerns.  I would like to take this opportunity to thank you for allowing me to participate in the care of your patient.Noreene Filbert, MD

## 2019-04-18 ENCOUNTER — Inpatient Hospital Stay: Payer: Medicare Other | Attending: Oncology

## 2019-04-18 ENCOUNTER — Encounter: Payer: Self-pay | Admitting: Oncology

## 2019-04-18 ENCOUNTER — Inpatient Hospital Stay: Payer: Medicare Other

## 2019-04-18 ENCOUNTER — Inpatient Hospital Stay (HOSPITAL_BASED_OUTPATIENT_CLINIC_OR_DEPARTMENT_OTHER): Payer: Medicare Other | Admitting: Oncology

## 2019-04-18 ENCOUNTER — Other Ambulatory Visit: Payer: Self-pay

## 2019-04-18 VITALS — BP 126/56 | HR 84 | Temp 98.2°F | Wt 168.0 lb

## 2019-04-18 DIAGNOSIS — D72819 Decreased white blood cell count, unspecified: Secondary | ICD-10-CM | POA: Diagnosis not present

## 2019-04-18 DIAGNOSIS — T451X5A Adverse effect of antineoplastic and immunosuppressive drugs, initial encounter: Secondary | ICD-10-CM | POA: Diagnosis not present

## 2019-04-18 DIAGNOSIS — Z95828 Presence of other vascular implants and grafts: Secondary | ICD-10-CM

## 2019-04-18 DIAGNOSIS — D6481 Anemia due to antineoplastic chemotherapy: Secondary | ICD-10-CM | POA: Diagnosis not present

## 2019-04-18 DIAGNOSIS — J209 Acute bronchitis, unspecified: Secondary | ICD-10-CM | POA: Insufficient documentation

## 2019-04-18 DIAGNOSIS — C3412 Malignant neoplasm of upper lobe, left bronchus or lung: Secondary | ICD-10-CM | POA: Insufficient documentation

## 2019-04-18 DIAGNOSIS — Z5112 Encounter for antineoplastic immunotherapy: Secondary | ICD-10-CM

## 2019-04-18 LAB — CBC WITH DIFFERENTIAL/PLATELET
Abs Immature Granulocytes: 0.01 10*3/uL (ref 0.00–0.07)
Basophils Absolute: 0 10*3/uL (ref 0.0–0.1)
Basophils Relative: 0 %
Eosinophils Absolute: 0.2 10*3/uL (ref 0.0–0.5)
Eosinophils Relative: 4 %
HCT: 32.8 % — ABNORMAL LOW (ref 36.0–46.0)
Hemoglobin: 10.7 g/dL — ABNORMAL LOW (ref 12.0–15.0)
Immature Granulocytes: 0 %
Lymphocytes Relative: 22 %
Lymphs Abs: 0.8 10*3/uL (ref 0.7–4.0)
MCH: 32.3 pg (ref 26.0–34.0)
MCHC: 32.6 g/dL (ref 30.0–36.0)
MCV: 99.1 fL (ref 80.0–100.0)
Monocytes Absolute: 0.3 10*3/uL (ref 0.1–1.0)
Monocytes Relative: 10 %
Neutro Abs: 2.2 10*3/uL (ref 1.7–7.7)
Neutrophils Relative %: 64 %
Platelets: 165 10*3/uL (ref 150–400)
RBC: 3.31 MIL/uL — ABNORMAL LOW (ref 3.87–5.11)
RDW: 14.2 % (ref 11.5–15.5)
WBC: 3.5 10*3/uL — ABNORMAL LOW (ref 4.0–10.5)
nRBC: 0 % (ref 0.0–0.2)

## 2019-04-18 LAB — COMPREHENSIVE METABOLIC PANEL
ALT: 17 U/L (ref 0–44)
AST: 19 U/L (ref 15–41)
Albumin: 4.1 g/dL (ref 3.5–5.0)
Alkaline Phosphatase: 60 U/L (ref 38–126)
Anion gap: 8 (ref 5–15)
BUN: 13 mg/dL (ref 8–23)
CO2: 26 mmol/L (ref 22–32)
Calcium: 9.2 mg/dL (ref 8.9–10.3)
Chloride: 103 mmol/L (ref 98–111)
Creatinine, Ser: 0.48 mg/dL (ref 0.44–1.00)
GFR calc Af Amer: >60 mL/min (ref >60)
GFR calc non Af Amer: >60 mL/min (ref >60)
Glucose, Bld: 95 mg/dL (ref 70–99)
Potassium: 4 mmol/L (ref 3.5–5.1)
Sodium: 137 mmol/L (ref 135–145)
Total Bilirubin: 0.5 mg/dL (ref 0.3–1.2)
Total Protein: 6.4 g/dL — ABNORMAL LOW (ref 6.5–8.1)

## 2019-04-18 MED ORDER — SODIUM CHLORIDE 0.9% FLUSH
10.0000 mL | Freq: Once | INTRAVENOUS | Status: AC
  Administered 2019-04-18: 10 mL via INTRAVENOUS
  Filled 2019-04-18: qty 10

## 2019-04-18 MED ORDER — SODIUM CHLORIDE 0.9 % IV SOLN
Freq: Once | INTRAVENOUS | Status: AC
  Filled 2019-04-18: qty 250

## 2019-04-18 MED ORDER — HEPARIN SOD (PORK) LOCK FLUSH 100 UNIT/ML IV SOLN
500.0000 [IU] | Freq: Once | INTRAVENOUS | Status: AC | PRN
  Administered 2019-04-18: 500 [IU]
  Filled 2019-04-18: qty 5

## 2019-04-18 MED ORDER — HEPARIN SOD (PORK) LOCK FLUSH 100 UNIT/ML IV SOLN
INTRAVENOUS | Status: AC
  Filled 2019-04-18: qty 5

## 2019-04-18 MED ORDER — SODIUM CHLORIDE 0.9 % IV SOLN
10.0000 mg/kg | Freq: Once | INTRAVENOUS | Status: AC
  Administered 2019-04-18: 740 mg via INTRAVENOUS
  Filled 2019-04-18: qty 10

## 2019-04-18 NOTE — Progress Notes (Signed)
Patient was diagnosed with bronchitis on 04/13/2019 and was given antibiotic. Patient stated that she continues to have a productive cough with phlegm. Patient also stated that she has had body aches and low grade fever.

## 2019-04-22 NOTE — Progress Notes (Signed)
Hematology/Oncology Consult note [facility]  [phone] [phone]  Patient Care Team: [name], MD as PCP - General (Family Medicine) Telford Nab, RN as Registered Nurse   Name of the patient: Jennifer Hodges  [id number]  06-07-1945   Date of visit: 04/22/19  Diagnosis- Adenocarcinoma of the lung stage III T1b N2 M0  Chief complaint/ Reason for visit-on treatment assessment prior to cycle 2 of durvalumab  Heme/Onc history: patient is a 74 year old female who was seen by pulmonary and ENT for ongoing symptoms of bronchitis and possible laryngitis and received antibiotics for the same.She underwent CT chest in September 2020 which showed multiple bilateral lung nodules with associated mediastinal adenopathy and possible primary left upper lobe lung mass. This was followed by a PET CT scan which showed a left upper lobe lung mass measuring 1.9 x 1.2 cm with an SUV of 10.7. Conglomerate AP window adenopathy measuring 5.8 x 2.1 cm with an SUV of 14. She was also noted to have hypermetabolic left hilar adenopathy and left supraclavicular 0.7 cm lymph node with an SUV of 4.6. Noted to have a 7 mm right lower lobe lung nodule with faint metabolic activity. No evidence of other distant metastatic disease. This was followed by a CT super D chest without contrast. The right lower lobe lung nodules were somewhat larger as compared to September 2020. 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  Reports ongoing fatigue denies other complaints  ECOG PS- 1 Pain scale- 0 Opioid associated constipation- no  Review of systems- Review of Systems  Constitutional: Positive for malaise/fatigue. Negative for chills, fever and weight loss.  HENT: Negative for congestion, ear discharge and nosebleeds.   Eyes: Negative for blurred vision.  Respiratory: Positive for cough and sputum production. Negative for hemoptysis, shortness of breath and wheezing.   Cardiovascular: Negative for chest pain, palpitations, orthopnea and claudication.  Gastrointestinal: Negative for abdominal pain, blood in stool, constipation, diarrhea, heartburn, melena, nausea and vomiting.  Genitourinary: Negative for dysuria, flank pain, frequency, hematuria and urgency.  Musculoskeletal: Negative for back pain, joint pain and myalgias.  Skin: Negative for rash.  Neurological: Negative for dizziness, tingling, focal weakness, seizures, weakness and headaches.  Endo/Heme/Allergies: Does not bruise/bleed easily.  Psychiatric/Behavioral: Negative for depression and suicidal ideas. The patient does not have insomnia.        Allergies  Allergen Reactions  . Aspirin Hives and Other (See Comments)    Difficulty breathing  . Celebrex [Celecoxib] Shortness Of Breath  . Morphine And Related Nausea And Vomiting and Swelling  . Adhesive [Tape] Other (See Comments)    Took top layer of skin off when removed.  . Clarithromycin Nausea And Vomiting  . Codeine Nausea And Vomiting  . Darvon [Propoxyphene Hcl] Nausea And Vomiting  . Demerol [Meperidine] Nausea And Vomiting  . Flonase [Fluticasone Propionate] Other (See Comments)    Fungal infection  . Simvastatin Other (See Comments)    "caused ulcers in mouth, and fever"  . Talwin [Pentazocine] Nausea And Vomiting  Past Medical History:  Diagnosis Date  . Anemia   . Asthma    No  Inhalers--Dr. [name] will order as needed  . Bronchiectasis (HCC)    mild  . Chronic headaches     followed by [facility] migraines  . COPD (chronic obstructive pulmonary disease) ([place])   . DDD (degenerative disc disease), lumbar   . Diabetes mellitus without complication [facility])    "doctor says I no longer have diabetes"    . Diverticulosis   . Family history of adverse reaction to anesthesia    PONV  . Gall stones    history of  . GERD (gastroesophageal reflux disease)    EGD 8/09- non bleeding erosive gastritis, documentd esophageal ulcerations.   . Hiatal hernia   . History of pneumonia   . Hypercholesterolemia   . IBS (irritable bowel syndrome)   . Malignant neoplasm of upper lobe of left lung ([place]) 12/27/2018  . Murmur   . Osteoarthritis    lumbar disc disease, left hip  . PONV (postoperative nausea and vomiting)    "Only with last hip and I believe it was due to the morphine"   . Sleep apnea    cpap asked to bring mask and tubing  . Weakness of right side of body      Past Surgical History:  Procedure Laterality Date  . ABDOMINAL HYSTERECTOMY  age 43  . ANTERIOR CERVICAL DECOMP/DISCECTOMY FUSION N/A 02/24/2015   Procedure: CERVICAL FOUR-FIVE, CERVICAL FIVE-SIX, CERVICAL SIX-SEVEN ANTERIOR CERVICAL DECOMPRESSION/DISCECTOMY FUSION ;  Surgeon: [name], MD;  Location: [place] [facility];  Service: Neurosurgery;  Laterality: N/A;  C45 C56 C67 anterior cervical decompression with fusion interbody prosthesis plating and bonegraft  . APPENDECTOMY    . BACK SURGERY     4th lumbar fusion  . BLADDER SURGERY N/A    with vaginal wall repair  . BREAST CYST ASPIRATION Bilateral    neg  . BREAST SURGERY Bilateral    cyst removed and reduction  . CARDIAC CATHETERIZATION  2014  . CARPAL TUNNEL RELEASE Right 02/11/2016   Procedure: CARPAL TUNNEL RELEASE;  Surgeon: [name], MD;  Location: [facility];  Service: Orthopedics;  Laterality: Right;  . CATARACT EXTRACTION W/  INTRAOCULAR LENS IMPLANT Bilateral 2015  . CHOLECYSTECTOMY    . EUS N/A 05/31/2012   Procedure: UPPER ENDOSCOPIC ULTRASOUND (EUS) LINEAR;  Surgeon: [name], MD;  Location: [facility];  Service: Endoscopy;  Laterality: N/A;  . EXCISIONAL HEMORRHOIDECTOMY    . JOINT REPLACEMENT Bilateral   . KNEE ARTHROSCOPY WITH LATERAL MENISECTOMY Right 07/07/2015   Procedure: KNEE ARTHROSCOPY WITH LATERAL MENISECTOMY, PARTIAL SYNOVECTOMY;  Surgeon: [name], MD;  Location: [facility];  Service: Orthopedics;  Laterality: Right;  . LUMBAR LAMINECTOMY    . PORTA CATH INSERTION N/A 01/07/2019   Procedure: PORTA CATH INSERTION;  Surgeon: [name], MD;  Location: [place] [facility];  Service: Cardiovascular;  Laterality: N/A;  . REDUCTION MAMMAPLASTY  1990  . RIGHT OOPHORECTOMY    . TOTAL HIP ARTHROPLASTY Left 05/01/2014   Dr. [name]  . TOTAL HIP ARTHROPLASTY Right 08/05/2014   Procedure: TOTAL HIP ARTHROPLASTY ANTERIOR APPROACH;  Surgeon: [name], MD;  Location: [facility];  Service: Orthopedics;  Laterality: Right;  . ULNAR NERVE TRANSPOSITION Right 02/11/2016   Procedure: ULNAR NERVE DECOMPRESSION/TRANSPOSITION;  Surgeon: [name], MD;  Location: [facility];  Service: Orthopedics;  Laterality: Right;  Marland Kitchen VIDEO BRONCHOSCOPY WITH ENDOBRONCHIAL ULTRASOUND Left 12/19/2018   Procedure: VIDEO BRONCHOSCOPY  WITH ENDOBRONCHIAL ULTRASOUND, LEFT, SLEEP APNEA;  Surgeon: [name], MD;  Location: [facility];  Service: Thoracic;  Laterality: Left;    Social History   Socioeconomic History  . Marital status: Married    Spouse name: Not on file  . Number of children: Not on file  . Years of education: Not on file  . Highest education level: Not on file  Occupational History  . Not on file  Tobacco Use  . Smoking status: Former Smoker    Quit date: 06/13/2007    Years since quitting: 11.8  . Smokeless tobacco: Never Used  Substance and Sexual Activity  . Alcohol use: No     Alcohol/week: 0.0 standard drinks  . Drug use: No  . Sexual activity: Never  Other Topics Concern  . Not on file  Social History Narrative  . Not on file   Social Determinants of Health   Financial Resource Strain:   . Difficulty of Paying Living Expenses: Not on file  Food Insecurity:   . Worried About Charity fundraiser in the Last Year: Not on file  . Ran Out of Food in the Last Year: Not on file  Transportation Needs:   . Lack of Transportation (Medical): Not on file  . Lack of Transportation (Non-Medical): Not on file  Physical Activity:   . Days of Exercise per Week: Not on file  . Minutes of Exercise per Session: Not on file  Stress:   . Feeling of Stress : Not on file  Social Connections:   . Frequency of Communication with Friends and Family: Not on file  . Frequency of Social Gatherings with Friends and Family: Not on file  . Attends Religious Services: Not on file  . Active Member of Clubs or Organizations: Not on file  . Attends Archivist Meetings: Not on file  . Marital Status: Not on file  Intimate Partner Violence:   . Fear of Current or Ex-Partner: Not on file  . Emotionally Abused: Not on file  . Physically Abused: Not on file  . Sexually Abused: Not on file    Family History  Problem Relation Age of Onset  . Heart disease Mother        s/p stent  . Hypertension Mother   . Hypercholesterolemia Mother   . Diabetes Father   . Stomach cancer Other        uncle  . Breast cancer Cousin   . Breast cancer Paternal Aunt      Current Outpatient Medications:  .  albuterol (PROVENTIL) (2.5 MG/3ML) 0.083% nebulizer solution, Take 2.5 mg by nebulization every 6 (six) hours as needed for wheezing., Disp: , Rfl:  .  albuterol (VENTOLIN HFA) 108 (90 Base) MCG/ACT inhaler, Inhale 2 puffs into the lungs every 6 (six) hours as needed for wheezing or shortness of breath., Disp: 18 g, Rfl: 0 .  Bioflavonoid Products (ESTER C PO), Take 1 tablet by mouth 3  (three) times daily., Disp: , Rfl:  .  Calcium-Magnesium-Zinc (CAL-MAG-ZINC PO), Take 1 tablet by mouth daily., Disp: , Rfl:  .  cetirizine (ZYRTEC) 5 MG tablet, Take 1 tablet (5 mg total) by mouth daily. (Patient taking differently: Take 5 mg by mouth daily as needed (runny nose.). ), Disp: 30 tablet, Rfl: 0 .  CHLORASEPTIC MAX SORE THROAT 1.5-33 % LIQD, Use as directed 1 spray in the mouth or throat as needed (throat irritation.)., Disp: , Rfl:  .  Cholecalciferol (VITAMIN D3) 25 MCG (  1000 UT) CHEW, Chew 5,000 Units by mouth daily., Disp: , Rfl:  .  Coenzyme Q10 (CO Q 10) 100 MG CAPS, Take 100 mg by mouth daily. , Disp: , Rfl:  .  Cyanocobalamin (VITAMIN B-12) 2500 MCG SUBL, Place 2,500 mcg under the tongue daily., Disp: , Rfl:  .  cyclobenzaprine (FLEXERIL) 10 MG tablet, Take 10 mg by mouth at bedtime. , Disp: , Rfl:  .  DHEA 25 MG CAPS, Take 25 mg by mouth daily., Disp: , Rfl:  .  ezetimibe (ZETIA) 10 MG tablet, Take 10 mg by mouth at bedtime., Disp: , Rfl:  .  folic acid (FOLVITE) 694 MCG tablet, Take 400 mcg by mouth 2 (two) times daily. , Disp: , Rfl:  .  Garlic 8546 MG CAPS, Take 1,000 mg by mouth daily., Disp: , Rfl:  .  Histamine Dihydrochloride (AUSTRALIAN DREAM ARTHRITIS EX), Apply 1 application topically 4 (four) times daily as needed (pain.)., Disp: , Rfl:  .  HYDROcodone-acetaminophen (NORCO/VICODIN) 5-325 MG tablet, 1-2 tabs po bid prn (Patient taking differently: Take 1 tablet by mouth at bedtime. ), Disp: 10 tablet, Rfl: 0 .  HYDROcodone-homatropine (HYCODAN) 5-1.5 MG/5ML syrup, Take 5 mLs by mouth 3 (three) times daily as needed for cough., Disp: 120 mL, Rfl: 0 .  ipratropium (ATROVENT) 0.06 % nasal spray, Place 2 sprays into both nostrils 4 (four) times daily as needed for rhinitis., Disp: 15 mL, Rfl: 0 .  montelukast (SINGULAIR) 10 MG tablet, Take 10 mg by mouth at bedtime. , Disp: , Rfl:  .  ondansetron (ZOFRAN-ODT) 4 MG disintegrating tablet, Take 4 mg by mouth every 8 (eight)  hours as needed for nausea/vomiting., Disp: , Rfl:  .  pantoprazole (PROTONIX) 40 MG tablet, Take 1 tablet (40 mg total) by mouth 2 (two) times daily before a meal., Disp: 60 tablet, Rfl: 1 .  Polyethyl Glycol-Propyl Glycol (SYSTANE) 0.4-0.3 % SOLN, Place 1 drop into both eyes 5 (five) times daily as needed (dry/irritated eyes). , Disp: , Rfl:  .  pyridOXINE (VITAMIN B-6) 100 MG tablet, Take 100 mg by mouth daily., Disp: , Rfl:  .  sucralfate (CARAFATE) 1 g tablet, 1 tablet daily., Disp: , Rfl:  .  umeclidinium-vilanterol (ANORO ELLIPTA) 62.5-25 MCG/INH AEPB, Inhale into the lungs., Disp: , Rfl:  .  vitamin E 400 UNIT capsule, Take 400 Units by mouth daily., Disp: , Rfl:  .  Zoledronic Acid (RECLAST IV), Inject 1 Dose into the vein as directed. ONCE A YEAR, Disp: , Rfl:   Physical exam:  Vitals:   04/18/19 1109  BP: (!) 126/56  Pulse: 84  Temp: 98.2 F (36.8 C)  TempSrc: Tympanic  SpO2: 98%  Weight: 168 lb (76.2 kg)   Physical Exam HENT:     Head: Normocephalic and atraumatic.  Eyes:     Pupils: Pupils are equal, round, and reactive to light.  Cardiovascular:     Rate and Rhythm: Normal rate and regular rhythm.     Heart sounds: Normal heart sounds.  Pulmonary:     Effort: Pulmonary effort is normal.     Breath sounds: Normal breath sounds. No wheezing or rhonchi.  Abdominal:     General: Bowel sounds are normal.     Palpations: Abdomen is soft.  Musculoskeletal:     Cervical back: Normal range of motion.  Skin:    General: Skin is warm and dry.  Neurological:     Mental Status: She is alert and oriented to person, place,  and time.      CMP Latest Ref Rng & Units 04/18/2019  Glucose 70 - 99 mg/dL 95  BUN 8 - 23 mg/dL 13  Creatinine 0.44 - 1.00 mg/dL 0.48  Sodium 135 - 145 mmol/L 137  Potassium 3.5 - 5.1 mmol/L 4.0  Chloride 98 - 111 mmol/L 103  CO2 22 - 32 mmol/L 26  Calcium 8.9 - 10.3 mg/dL 9.2  Total Protein 6.5 - 8.1 g/dL 6.4(L)  Total Bilirubin 0.3 - 1.2 mg/dL  0.5  Alkaline Phos 38 - 126 U/L 60  AST 15 - 41 U/L 19  ALT 0 - 44 U/L 17   CBC Latest Ref Rng & Units 04/18/2019  WBC 4.0 - 10.5 K/uL 3.5(L)  Hemoglobin 12.0 - 15.0 g/dL 10.7(L)  Hematocrit 36.0 - 46.0 % 32.8(L)  Platelets 150 - 400 K/uL 165      Assessment and plan- Patient is a 74 y.o. female with stage III adenocarcinoma of the lung here for on treatment assessment prior to cycle 2 of maintenance durvalumab  Counts okay to proceed with cycle 2 of maintenance durvalumab today.  Her scans after concurrent chemoradiation showed partial response.  She will be receiving durvalumab every 2 years for up to 1 year.  I will plan to repeat scans after about 6 cycles.  I will see her back in 2 weeks time for cycle 3  Anemia and leukopenia: Likely secondary to prior chemotherapy.  Stable continue to monitor  Acute bronchitis: Patient seems to be having a protracted course of bronchitis with productive sputum production.  She is seeing Dr. [name] from pulmonary as well.  Continue to monitor   Visit Diagnosis 1. Malignant neoplasm of upper lobe of left lung ([place])   2. Encounter for antineoplastic immunotherapy   3. Antineoplastic chemotherapy induced anemia      Dr. Randa Evens, MD, MPH [facility] [phone] 04/22/2019 8:06 AM

## 2019-04-23 ENCOUNTER — Encounter: Payer: Self-pay | Admitting: Speech Pathology

## 2019-04-23 ENCOUNTER — Ambulatory Visit: Payer: Medicare Other | Admitting: Speech Pathology

## 2019-04-23 ENCOUNTER — Other Ambulatory Visit: Payer: Self-pay

## 2019-04-23 DIAGNOSIS — R490 Dysphonia: Secondary | ICD-10-CM

## 2019-04-23 NOTE — Therapy (Signed)
[place] MAIN [facility] SERVICES [address] [place], [place], [address] Phone: [phone]   Fax:  [phone]  Speech Language Pathology Treatment/Discharge Summary  Patient Details  Name: Jennifer Hodges MRN: [id number] Date of Birth: 1946/02/07 Referring Provider (SLP): Dr. Pryor Ochoa   Encounter Date: 04/23/2019  End of Session - 04/23/19 1628    Visit Number  12    Number of Visits  17    Date for SLP Re-Evaluation  05/14/19    Authorization Type  Medicare    Authorization Time Period  Start 04/16/2019    Authorization - Visit Number  2    Authorization - Number of Visits  10    SLP Start Time  0230    SLP Stop Time   0300    SLP Time Calculation (min)  30 min    Activity Tolerance  Patient tolerated treatment well       Past Medical History:  Diagnosis Date  . Anemia   . Asthma    No Inhalers--Dr. [name] will order as needed  . Bronchiectasis (HCC)    mild  . Chronic headaches     followed by [facility] migraines  . COPD (chronic obstructive pulmonary disease) ([place])   . DDD (degenerative disc disease), lumbar   . Diabetes mellitus without complication [facility])    "doctor says I no longer have diabetes"    . Diverticulosis   . Family history of adverse reaction to anesthesia    PONV  . Gall stones    history of  . GERD (gastroesophageal reflux disease)    EGD 8/09- non bleeding erosive gastritis, documentd esophageal ulcerations.   . Hiatal hernia   . History of pneumonia   . Hypercholesterolemia   . IBS (irritable bowel syndrome)   . Malignant neoplasm of upper lobe of left lung ([place]) 12/27/2018  . Murmur   . Osteoarthritis    lumbar disc disease, left hip  . PONV (postoperative nausea and vomiting)    "Only with last hip and I believe it was due to the morphine"   . Sleep apnea    cpap asked to bring mask and tubing  . Weakness of right side of body     Past Surgical History:  Procedure Laterality Date  .  ABDOMINAL HYSTERECTOMY  age 31  . ANTERIOR CERVICAL DECOMP/DISCECTOMY FUSION N/A 02/24/2015   Procedure: CERVICAL FOUR-FIVE, CERVICAL FIVE-SIX, CERVICAL SIX-SEVEN ANTERIOR CERVICAL DECOMPRESSION/DISCECTOMY FUSION ;  Surgeon: [name], MD;  Location: [place];  Service: Neurosurgery;  Laterality: N/A;  C45 C56 C67 anterior cervical decompression with fusion interbody prosthesis plating and bonegraft  . APPENDECTOMY    . BACK SURGERY     4th lumbar fusion  . BLADDER SURGERY N/A    with vaginal wall repair  . BREAST CYST ASPIRATION Bilateral    neg  . BREAST SURGERY Bilateral    cyst removed and reduction  . CARDIAC CATHETERIZATION  2014  . CARPAL TUNNEL RELEASE Right 02/11/2016   Procedure: CARPAL TUNNEL RELEASE;  Surgeon: [name], MD;  Location: [facility] ORS;  Service: Orthopedics;  Laterality: Right;  . CATARACT EXTRACTION W/ INTRAOCULAR LENS IMPLANT Bilateral 2015  . CHOLECYSTECTOMY    . EUS N/A 05/31/2012   Procedure: UPPER ENDOSCOPIC ULTRASOUND (EUS) LINEAR;  Surgeon: [name], MD;  Location: [facility];  Service: Endoscopy;  Laterality: N/A;  . EXCISIONAL HEMORRHOIDECTOMY    . JOINT REPLACEMENT Bilateral   . KNEE ARTHROSCOPY WITH LATERAL MENISECTOMY  Right 07/07/2015   Procedure: KNEE ARTHROSCOPY WITH LATERAL MENISECTOMY, PARTIAL SYNOVECTOMY;  Surgeon: [name], MD;  Location: [facility] ORS;  Service: Orthopedics;  Laterality: Right;  . LUMBAR LAMINECTOMY    . PORTA CATH INSERTION N/A 01/07/2019   Procedure: PORTA CATH INSERTION;  Surgeon: [name], MD;  Location: [place] CV LAB;  Service: Cardiovascular;  Laterality: N/A;  . REDUCTION MAMMAPLASTY  1990  . RIGHT OOPHORECTOMY    . TOTAL HIP ARTHROPLASTY Left 05/01/2014   Dr. [name]  . TOTAL HIP ARTHROPLASTY Right 08/05/2014   Procedure: TOTAL HIP ARTHROPLASTY ANTERIOR APPROACH;  Surgeon: [name], MD;  Location: [facility] ORS;  Service: Orthopedics;  Laterality: Right;  . ULNAR NERVE TRANSPOSITION  Right 02/11/2016   Procedure: ULNAR NERVE DECOMPRESSION/TRANSPOSITION;  Surgeon: [name], MD;  Location: [facility] ORS;  Service: Orthopedics;  Laterality: Right;  Marland Kitchen VIDEO BRONCHOSCOPY WITH ENDOBRONCHIAL ULTRASOUND Left 12/19/2018   Procedure: VIDEO BRONCHOSCOPY WITH ENDOBRONCHIAL ULTRASOUND, LEFT, SLEEP APNEA;  Surgeon: [name], MD;  Location: [facility] ORS;  Service: Thoracic;  Laterality: Left;    There were no vitals filed for this visit.  Subjective Assessment - 04/23/19 1625    Subjective  Patient complains of a cough secondary to bronchitis with green mucus, costochondritis.    Currently in Pain?  Yes            ADULT SLP TREATMENT - 04/23/19 0001      Treatment Provided   Treatment provided  Cognitive-Linquistic      Cognitive-Linquistic Treatment   Treatment focused on  Voice    Skilled Treatment The patient attended 11 sessions for treatment and management of dysphonia. Patient was educated on flow phonation, non-voiced speech, proper breath support for speech tasks, etc. As a result of this treatment, patient is able to identify when her voice is straining and when she is using proper breath techniques. Patient is also aware of when she is holding her breath. Due to current medical condition(s), patient is unable to consistently provide enough breath support to phonate at a louder volume, however, patient was educated on the value and importance of vocal rest. Patient performs exceedingly well on structured speech tasks but still struggles to maintain appropriate volume on unstructured speech tasks. During the final session, patient and clinician reviewed airflow techniques and the patient received feedback on accuracy of non-voiced phrases of increased length. Patient was advised to rest her voice as much as possible, and avoid yelling and other vocally abusive behaviors.      Assessment / Recommendations / Plan   Plan  Discharge SLP treatment due to medical issues.      Progression Toward Goals   Progression toward goals  Goals met, education completed, patient discharged from SLP       SLP Education - 04/23/19 1627    Education Details  relaxed phonation. avoiding straining. advised vocal rest whe possible.    Person(s) Educated  Patient    Methods  Explanation    Comprehension  Verbalized understanding         SLP Long Term Goals - 04/24/19 0909      SLP LONG TERM GOAL #1   Title  The patient will be independent for abdominal breathing and breath support exercises.    Status  Achieved      SLP LONG TERM GOAL #2   Title  The patient will minimize vocal tension via resonant voice therapy (or comparable technique) with min SLP cues with 80% accuracy.  Status  Partially Met      SLP LONG TERM GOAL #3   Title  The patient will maintain relaxed phonation / oral resonance for paragraph length recitation with 80% accuracy.    Status  Partially Met       Plan - 04/23/19 1629    Clinical Impression Statement Patient reports that family members can hear her better than they used to. However, due to patient's current condition, speech therapy is exhausting. Patient has been noted to feel fatigued and exhausted during recent sessions. Due to the patient's current course of treatment for cancer, in addition to her bronchitis and the cold weather, patient is having a very difficult time breathing. Creating an ample amount of airflow to project her voice is extremely difficult for the patient. Patient was in agreement with clinician on decision to discharge, and advised to come back when she is feeling up to it.   Speech Therapy Frequency  Other (comment)   discharge   Duration  --   discharge   Treatment/Interventions  Patient/family education;Other (comment)    Potential to Achieve Goals  Good    Potential Considerations  Ability to learn/carryover information;Previous level of function;Co-morbidities;Severity of impairments;Cooperation/participation  level;Medical prognosis;Family/community support    SLP Home Exercise Plan  Provided    Consulted and Agree with Plan of Care  Patient       Patient will benefit from skilled therapeutic intervention in order to improve the following deficits and impairments:   Dysphonia    Problem List Patient Active Problem List   Diagnosis Date Noted  . Benign breast lumps 01/11/2019  . Gout 01/11/2019  . Hives 01/11/2019  . Lung disease 01/11/2019  . Migraine headache 01/11/2019  . Goals of care, counseling/discussion 12/27/2018  . Malignant neoplasm of upper lobe of left lung ([place]) 12/27/2018  . Night sweats 08/07/2016  . 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 Osteopenia of multiple sites 04/27/2016  . Left carpal tunnel syndrome 02/02/2016  . Carotid stenosis, asymptomatic, bilateral 12/29/2015  . Prediabetes 09/28/2015  . Primary osteoarthritis involving multiple joints 09/28/2015  . Knee pain 05/11/2015  . Chest tightness 03/24/2015  . Stool incontinence 03/24/2015  . Urinary frequency 03/04/2015  . Cervical spondylosis with radiculopathy 02/24/2015  . Pre-syncope 02/19/2015  . URI (upper respiratory infection) 02/15/2015  . Neck pain 12/25/2014  . Pelvic pain in female 11/16/2014  . Primary osteoarthritis of hip 08/05/2014  . Heel pain 06/01/2014  . Diarrhea 06/01/2014  . Health care maintenance 06/01/2014  . Right leg pain 03/30/2014  . Right arm pain 03/30/2014  . Sinusitis 03/30/2014  . Internal nasal lesion 11/12/2013  . Other specified disorders of nose and nasal sinuses 11/12/2013  . Chronic tension-type headache, intractable 10/30/2013  . Intractable migraine without aura and without status migrainosus 10/30/2013  . Occipital neuralgia of left side 10/30/2013  . IBS (irritable bowel syndrome) 09/03/2013  . Rib pain 08/04/2013  . Cervical radiculitis 08/01/2013  . Lumbar radiculitis 08/01/2013  . Palpitations 07/25/2013  . Benign essential hypertension  05/28/2013  . Neuralgia, neuritis, and radiculitis, unspecified 05/28/2013  . Vitamin D deficiency 05/28/2013  . Elevated AFP 04/28/2013  . Abnormality of alpha-fetoprotein 04/28/2013  . Osteoporosis 11/12/2012  . Incomplete emptying of bladder 07/11/2012  . Prolapse of vaginal vault after hysterectomy 07/11/2012  . Nonspecific (abnormal) findings on radiological and other examination of gastrointestinal tract 05/31/2012  . Bronchiectasis ([place]) 02/05/2012  . Sleep apnea 02/05/2012  . Degenerative disc disease, cervical 02/05/2012  . Hypercholesterolemia  02/05/2012  . GERD (gastroesophageal reflux disease) 02/05/2012  . Chronic headaches 02/05/2012    Lou Miner 04/24/2019, 9:10 AM  [place] MAIN [facility] SERVICES [address] [place], [place], [address] Phone: [phone]   Fax:  [phone]   Name: HANI PATNODE MRN: [id number] Date of Birth: Apr 21, 1945

## 2019-04-25 ENCOUNTER — Ambulatory Visit: Payer: Medicare Other | Admitting: Speech Pathology

## 2019-04-25 ENCOUNTER — Telehealth: Payer: Self-pay | Admitting: *Deleted

## 2019-04-25 NOTE — Telephone Encounter (Signed)
Can you get in touch with Dr. Leane Platt office from pulmonary and ask if they have any recommendations first?

## 2019-04-25 NOTE — Telephone Encounter (Signed)
Patient called and reports that her cough is back and getting worse, she is coughing up green sputum and it is getting darker green every day. She is asking if she should have another prescription sent in. Please advise

## 2019-04-25 NOTE — Telephone Encounter (Signed)
Called Dr. Gust Brooms office and had to leave a voicemail to his nurse Caryl Pina.

## 2019-04-26 NOTE — Telephone Encounter (Signed)
Caryl Pina from Dr. Gust Brooms office stated that they had contacted the patient and prescribed her antibiotics.

## 2019-04-30 ENCOUNTER — Telehealth: Payer: Self-pay | Admitting: Oncology

## 2019-04-30 ENCOUNTER — Encounter: Payer: Medicare Other | Admitting: Speech Pathology

## 2019-04-30 NOTE — Telephone Encounter (Signed)
Patient phoned on this date stating that she would like to reschedule her appt for 05-02-19 as there was the possibility of bad weather on 05-02-19. Appts rescheduled for 05-07-19.

## 2019-05-02 ENCOUNTER — Inpatient Hospital Stay: Payer: Medicare Other

## 2019-05-02 ENCOUNTER — Inpatient Hospital Stay: Payer: Medicare Other | Admitting: Oncology

## 2019-05-02 ENCOUNTER — Encounter: Payer: Medicare Other | Admitting: Speech Pathology

## 2019-05-06 ENCOUNTER — Telehealth: Payer: Self-pay | Admitting: *Deleted

## 2019-05-06 NOTE — Telephone Encounter (Signed)
Called pt to let her know that we did not get shipment of drug to be able to give her the treatment of durvalumab. We will need to r.s to her next week. She is agreeable. She states that she has been having HA and when she gets up-head feels like it is spinning. Seh sw dr Ubaldo Glassing and he has ordered several labs to be done, and scheduling her for carotid arteries to see if it is worse with blockage. She last had it done  2017and it was some blockage and trying to work up with her balance issues and Ha and things spinning. I told her scheduler would call tom. To get new appt

## 2019-05-07 ENCOUNTER — Inpatient Hospital Stay: Payer: Medicare Other

## 2019-05-07 ENCOUNTER — Telehealth: Payer: Self-pay | Admitting: Oncology

## 2019-05-07 ENCOUNTER — Inpatient Hospital Stay: Payer: Medicare Other | Admitting: Oncology

## 2019-05-07 ENCOUNTER — Encounter: Payer: Medicare Other | Admitting: Speech Pathology

## 2019-05-07 NOTE — Telephone Encounter (Signed)
Writer was informed that the lab did not have patient's medication and that patient is aware of this. Appts for 05-07-19 have been cancelled and rescheduled for 05-14-19. Writer phoned patient and left voice mail informing of new appts.

## 2019-05-08 ENCOUNTER — Ambulatory Visit
Admission: RE | Admit: 2019-05-08 | Discharge: 2019-05-08 | Disposition: A | Discharge status: Home / Self Care | Payer: Medicare Other | Source: Ambulatory Visit | Attending: Oncology | Admitting: Oncology

## 2019-05-08 ENCOUNTER — Telehealth: Payer: Self-pay | Admitting: *Deleted

## 2019-05-08 ENCOUNTER — Other Ambulatory Visit: Payer: Self-pay

## 2019-05-08 DIAGNOSIS — C3490 Malignant neoplasm of unspecified part of unspecified bronchus or lung: Secondary | ICD-10-CM

## 2019-05-08 DIAGNOSIS — R42 Dizziness and giddiness: Secondary | ICD-10-CM

## 2019-05-08 MED ORDER — GADOBUTROL 1 MMOL/ML IV SOLN
7.0000 mL | Freq: Once | INTRAVENOUS | Status: AC | PRN
  Administered 2019-05-08: 18:00:00 7 mL via INTRAVENOUS

## 2019-05-08 NOTE — Telephone Encounter (Signed)
MRI scheduled for 05/08/19 at 5pm. Pt made [facility]. Informed that will be notified with results once available. Informed to keep appt as scheduled with Dr. Janese Banks on 05/14/19. Pt verbalized understanding. Nothing further needed.

## 2019-05-08 NOTE — Telephone Encounter (Signed)
Orders entered and will notify pt with appts once scheduled.

## 2019-05-08 NOTE — Telephone Encounter (Signed)
MRI brain first and then she can be seen by me or at [facility]

## 2019-05-08 NOTE — Telephone Encounter (Signed)
Patient called stating that her dizziness and headaches are worse and it has been going on for 2 weeks now. She feels she is going to fall when she gets up. She states that Dr Ubaldo Glassing did some labs and said he was going to do some other tests, but she has not heard from his office about it. She is asking for something to be done. Please advise.

## 2019-05-09 ENCOUNTER — Encounter: Payer: Medicare Other | Admitting: Speech Pathology

## 2019-05-10 ENCOUNTER — Telehealth: Payer: Self-pay | Admitting: *Deleted

## 2019-05-10 MED ORDER — MECLIZINE HCL 25 MG PO TABS: 25.0000 mg | ORAL_TABLET | Freq: Four times a day (QID) | ORAL | 0 refills | Status: DC | PRN

## 2019-05-10 NOTE — Telephone Encounter (Signed)
Pt called in to report that dizziness is getting worse and wants to know if Dr. Janese Banks has any further recommendations. States that she is concerned it may related to meniere's disease since her mom and other relatives have been diagnosed with it.   Per Dr. Janese Banks, pt can start meclizine 25mg  q6hours PRN and to follow up with Dr. Richardson Landry for further management. Pt made aware that MRI brain was negative.   Pt verbalized understanding. Nothing further needed at this time.

## 2019-05-13 ENCOUNTER — Encounter: Payer: Self-pay | Admitting: Oncology

## 2019-05-13 ENCOUNTER — Other Ambulatory Visit: Payer: Self-pay

## 2019-05-13 NOTE — Progress Notes (Signed)
Patient stated that she has had headaches, nausea, dizziness and that she would see Dr. Pryor Ochoa on Wednesday. Patient denied vomiting, fever, chills, diarrhea or constipation.

## 2019-05-14 ENCOUNTER — Inpatient Hospital Stay (HOSPITAL_BASED_OUTPATIENT_CLINIC_OR_DEPARTMENT_OTHER): Payer: Medicare Other | Admitting: Oncology

## 2019-05-14 ENCOUNTER — Inpatient Hospital Stay: Payer: Medicare Other

## 2019-05-14 ENCOUNTER — Other Ambulatory Visit: Payer: Self-pay

## 2019-05-14 ENCOUNTER — Inpatient Hospital Stay: Payer: Medicare Other | Attending: Oncology

## 2019-05-14 VITALS — BP 127/76 | HR 72 | Temp 97.7°F | Wt 166.5 lb

## 2019-05-14 DIAGNOSIS — C3490 Malignant neoplasm of unspecified part of unspecified bronchus or lung: Secondary | ICD-10-CM

## 2019-05-14 DIAGNOSIS — Z5112 Encounter for antineoplastic immunotherapy: Secondary | ICD-10-CM

## 2019-05-14 DIAGNOSIS — C3412 Malignant neoplasm of upper lobe, left bronchus or lung: Secondary | ICD-10-CM | POA: Diagnosis present

## 2019-05-14 DIAGNOSIS — R42 Dizziness and giddiness: Secondary | ICD-10-CM

## 2019-05-14 DIAGNOSIS — Z79899 Other long term (current) drug therapy: Secondary | ICD-10-CM | POA: Insufficient documentation

## 2019-05-14 DIAGNOSIS — Z95828 Presence of other vascular implants and grafts: Secondary | ICD-10-CM

## 2019-05-14 LAB — CBC WITH DIFFERENTIAL/PLATELET
Abs Immature Granulocytes: 0.01 10*3/uL (ref 0.00–0.07)
Basophils Absolute: 0 10*3/uL (ref 0.0–0.1)
Basophils Relative: 0 %
Eosinophils Absolute: 0.1 10*3/uL (ref 0.0–0.5)
Eosinophils Relative: 2 %
HCT: 35.5 % — ABNORMAL LOW (ref 36.0–46.0)
Hemoglobin: 11.4 g/dL — ABNORMAL LOW (ref 12.0–15.0)
Immature Granulocytes: 0 %
Lymphocytes Relative: 19 %
Lymphs Abs: 0.6 10*3/uL — ABNORMAL LOW (ref 0.7–4.0)
MCH: 31 pg (ref 26.0–34.0)
MCHC: 32.1 g/dL (ref 30.0–36.0)
MCV: 96.5 fL (ref 80.0–100.0)
Monocytes Absolute: 0.3 10*3/uL (ref 0.1–1.0)
Monocytes Relative: 10 %
Neutro Abs: 2.2 10*3/uL (ref 1.7–7.7)
Neutrophils Relative %: 69 %
Platelets: 209 10*3/uL (ref 150–400)
RBC: 3.68 MIL/uL — ABNORMAL LOW (ref 3.87–5.11)
RDW: 12.3 % (ref 11.5–15.5)
WBC: 3.2 10*3/uL — ABNORMAL LOW (ref 4.0–10.5)
nRBC: 0 % (ref 0.0–0.2)

## 2019-05-14 LAB — CORTISOL: Cortisol, Plasma: 8.6 ug/dL

## 2019-05-14 LAB — COMPREHENSIVE METABOLIC PANEL
ALT: 14 U/L (ref 0–44)
AST: 17 U/L (ref 15–41)
Albumin: 4 g/dL (ref 3.5–5.0)
Alkaline Phosphatase: 69 U/L (ref 38–126)
Anion gap: 10 (ref 5–15)
BUN: 12 mg/dL (ref 8–23)
CO2: 25 mmol/L (ref 22–32)
Calcium: 8.8 mg/dL — ABNORMAL LOW (ref 8.9–10.3)
Chloride: 104 mmol/L (ref 98–111)
Creatinine, Ser: 0.41 mg/dL — ABNORMAL LOW (ref 0.44–1.00)
GFR calc Af Amer: >60 mL/min (ref >60)
GFR calc non Af Amer: >60 mL/min (ref >60)
Glucose, Bld: 97 mg/dL (ref 70–99)
Potassium: 4.1 mmol/L (ref 3.5–5.1)
Sodium: 139 mmol/L (ref 135–145)
Total Bilirubin: 0.5 mg/dL (ref 0.3–1.2)
Total Protein: 6.5 g/dL (ref 6.5–8.1)

## 2019-05-14 LAB — TSH: TSH: 2.447 u[IU]/mL (ref 0.350–4.500)

## 2019-05-14 MED ORDER — SODIUM CHLORIDE 0.9% FLUSH
10.0000 mL | Freq: Once | INTRAVENOUS | Status: AC
  Administered 2019-05-14: 10 mL via INTRAVENOUS
  Filled 2019-05-14: qty 10

## 2019-05-14 MED ORDER — HEPARIN SOD (PORK) LOCK FLUSH 100 UNIT/ML IV SOLN
500.0000 [IU] | Freq: Once | INTRAVENOUS | Status: AC
  Administered 2019-05-14: 500 [IU] via INTRAVENOUS
  Filled 2019-05-14: qty 5

## 2019-05-14 MED ORDER — SODIUM CHLORIDE 0.9 % IV SOLN
Freq: Once | INTRAVENOUS | Status: AC
  Filled 2019-05-14: qty 250

## 2019-05-14 MED ORDER — HEPARIN SOD (PORK) LOCK FLUSH 100 UNIT/ML IV SOLN
500.0000 [IU] | Freq: Once | INTRAVENOUS | Status: DC | PRN
  Filled 2019-05-14: qty 5

## 2019-05-14 MED ORDER — SODIUM CHLORIDE 0.9 % IV SOLN
10.0000 mg/kg | Freq: Once | INTRAVENOUS | Status: AC
  Administered 2019-05-14: 740 mg via INTRAVENOUS
  Filled 2019-05-14: qty 10

## 2019-05-15 LAB — ACTH: C206 ACTH: 11.7 pg/mL (ref 7.2–63.3)

## 2019-05-15 NOTE — Progress Notes (Signed)
Hematology/Oncology Consult note [facility]  [phone] [phone]  Patient Care Team: [name], MD as PCP - General (Family Medicine) Telford Nab, RN as Registered Nurse   Name of the patient: Jennifer Hodges  [id number]  [birth year]   Date of visit: 05/15/19  Diagnosis- Adenocarcinoma of the lung stage III T1b N2 M0   Chief complaint/ Reason for visit-on treatment assessment prior to cycle 3 of maintenance durvalumab  Heme/Onc history: patient is a 74 year old female who was seen by pulmonary and ENT for ongoing symptoms of bronchitis and possible laryngitis and received antibiotics for the same.She underwent CT chest in September 2020 which showed multiple bilateral lung nodules with associated mediastinal adenopathy and possible primary left upper lobe lung mass. This was followed by a PET CT scan which showed a left upper lobe lung mass measuring 1.9 x 1.2 cm with an SUV of 10.7. Conglomerate AP window adenopathy measuring 5.8 x 2.1 cm with an SUV of 14. She was also noted to have hypermetabolic left hilar adenopathy and left supraclavicular 0.7 cm lymph node with an SUV of 4.6. Noted to have a 7 mm right lower lobe lung nodule with faint metabolic activity. No evidence of other distant metastatic disease. This was followed by a CT super D chest without contrast. The right lower lobe lung nodules were somewhat larger as compared to September 2020. 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  She has been started on meclizine and will be seeing ENT soon.  Reports that cough and sputum production is overall better  ECOG PS- 1 Pain scale- 0   Review of systems- Review of Systems  Constitutional: Positive for malaise/fatigue. Negative for chills, fever and weight loss.  HENT: Negative for congestion, ear discharge and nosebleeds.   Eyes: Negative for blurred vision.  Respiratory: Negative for cough, hemoptysis, sputum production, shortness of breath and wheezing.   Cardiovascular: Negative for chest pain, palpitations, orthopnea and claudication.  Gastrointestinal: Negative for abdominal pain, blood in stool, constipation, diarrhea, heartburn, melena, nausea and vomiting.  Genitourinary: Negative for dysuria, flank pain, frequency, hematuria and urgency.  Musculoskeletal: Negative for back pain, joint pain and myalgias.  Skin: Negative for rash.  Neurological: Positive for dizziness and headaches. Negative for tingling, focal weakness, seizures and weakness.  Endo/Heme/Allergies: Does not bruise/bleed easily.  Psychiatric/Behavioral: Negative for depression and suicidal ideas. The patient does not have insomnia.        Allergies  Allergen Reactions  . Aspirin Hives and Other (See Comments)    Difficulty breathing  . Celebrex [Celecoxib] Shortness Of Breath  . Morphine And Related Nausea And Vomiting and Swelling  . Adhesive [Tape] Other (See Comments)    Took top layer of skin off when removed.  . Clarithromycin Nausea And Vomiting  . Codeine Nausea And Vomiting  . Darvon [Propoxyphene Hcl] Nausea And Vomiting  . Demerol [Meperidine] Nausea And Vomiting  . Flonase [Fluticasone Propionate] Other (See Comments)    Fungal infection  . Simvastatin Other (See Comments)    "caused ulcers in mouth, and  fever"  . Talwin [Pentazocine] Nausea And Vomiting     Past Medical History:  Diagnosis  Date  . Anemia   . Asthma    No Inhalers--Dr. [name] will order as needed  . Bronchiectasis (HCC)    mild  . Chronic headaches     followed by [facility] migraines  . COPD (chronic obstructive pulmonary disease) ([place])   . DDD (degenerative disc disease), lumbar   . Diabetes mellitus without complication [facility])    "doctor says I no longer have diabetes"    . Diverticulosis   . Family history of adverse reaction to anesthesia    PONV  . Gall stones    history of  . GERD (gastroesophageal reflux disease)    EGD 8/09- non bleeding erosive gastritis, documentd esophageal ulcerations.   . Hiatal hernia   . History of pneumonia   . Hypercholesterolemia   . IBS (irritable bowel syndrome)   . Malignant neoplasm of upper lobe of left lung ([place]) 12/27/2018  . Murmur   . Osteoarthritis    lumbar disc disease, left hip  . PONV (postoperative nausea and vomiting)    "Only with last hip and I believe it was due to the morphine"   . Sleep apnea    cpap asked to bring mask and tubing  . Weakness of right side of body      Past Surgical History:  Procedure Laterality Date  . ABDOMINAL HYSTERECTOMY  age 73  . ANTERIOR CERVICAL DECOMP/DISCECTOMY FUSION N/A 02/24/2015   Procedure: CERVICAL FOUR-FIVE, CERVICAL FIVE-SIX, CERVICAL SIX-SEVEN ANTERIOR CERVICAL DECOMPRESSION/DISCECTOMY FUSION ;  Surgeon: [name], MD;  Location: [place] NEURO ORS;  Service: Neurosurgery;  Laterality: N/A;  C45 C56 C67 anterior cervical decompression with fusion interbody prosthesis plating and bonegraft  . APPENDECTOMY    . BACK SURGERY     4th lumbar fusion  . BLADDER SURGERY N/A    with vaginal wall repair  . BREAST CYST ASPIRATION Bilateral    neg  . BREAST SURGERY Bilateral    cyst removed and reduction  . CARDIAC CATHETERIZATION  2014  . CARPAL TUNNEL RELEASE Right 02/11/2016   Procedure: CARPAL TUNNEL RELEASE;  Surgeon: [name], MD;  Location: [facility] ORS;  Service: Orthopedics;  Laterality:  Right;  . CATARACT EXTRACTION W/ INTRAOCULAR LENS IMPLANT Bilateral 2015  . CHOLECYSTECTOMY    . EUS N/A 05/31/2012   Procedure: UPPER ENDOSCOPIC ULTRASOUND (EUS) LINEAR;  Surgeon: [name], MD;  Location: [facility];  Service: Endoscopy;  Laterality: N/A;  . EXCISIONAL HEMORRHOIDECTOMY    . JOINT REPLACEMENT Bilateral   . KNEE ARTHROSCOPY WITH LATERAL MENISECTOMY Right 07/07/2015   Procedure: KNEE ARTHROSCOPY WITH LATERAL MENISECTOMY, PARTIAL SYNOVECTOMY;  Surgeon: [name], MD;  Location: [facility] ORS;  Service: Orthopedics;  Laterality: Right;  . LUMBAR LAMINECTOMY    . PORTA CATH INSERTION N/A 01/07/2019   Procedure: PORTA CATH INSERTION;  Surgeon: [name], MD;  Location: [place] CV LAB;  Service: Cardiovascular;  Laterality: N/A;  . REDUCTION MAMMAPLASTY  1990  . RIGHT OOPHORECTOMY    . TOTAL HIP ARTHROPLASTY Left 05/01/2014   Dr. [name]  . TOTAL HIP ARTHROPLASTY Right 08/05/2014   Procedure: TOTAL HIP ARTHROPLASTY ANTERIOR APPROACH;  Surgeon: [name], MD;  Location: [facility] ORS;  Service: Orthopedics;  Laterality: Right;  . ULNAR NERVE TRANSPOSITION Right 02/11/2016   Procedure: ULNAR NERVE DECOMPRESSION/TRANSPOSITION;  Surgeon: [name], MD;  Location: [facility] ORS;  Service: Orthopedics;  Laterality: Right;  .  VIDEO BRONCHOSCOPY WITH ENDOBRONCHIAL ULTRASOUND Left 12/19/2018   Procedure: VIDEO BRONCHOSCOPY WITH ENDOBRONCHIAL ULTRASOUND, LEFT, SLEEP APNEA;  Surgeon: [name], MD;  Location: [facility] ORS;  Service: Thoracic;  Laterality: Left;    Social History   Socioeconomic History  . Marital status: Married    Spouse name: Not on file  . Number of children: Not on file  . Years of education: Not on file  . Highest education level: Not on file  Occupational History  . Not on file  Tobacco Use  . Smoking status: Former Smoker    Quit date: 06/13/2007    Years since quitting: 11.9  . Smokeless tobacco: Never Used  Substance and Sexual Activity    . Alcohol use: No    Alcohol/week: 0.0 standard drinks  . Drug use: No  . Sexual activity: Never  Other Topics Concern  . Not on file  Social History Narrative  . Not on file   Social Determinants of Health   Financial Resource Strain:   . Difficulty of Paying Living Expenses: Not on file  Food Insecurity:   . Worried About Charity fundraiser in the Last Year: Not on file  . Ran Out of Food in the Last Year: Not on file  Transportation Needs:   . Lack of Transportation (Medical): Not on file  . Lack of Transportation (Non-Medical): Not on file  Physical Activity:   . Days of Exercise per Week: Not on file  . Minutes of Exercise per Session: Not on file  Stress:   . Feeling of Stress : Not on file  Social Connections:   . Frequency of Communication with Friends and Family: Not on file  . Frequency of Social Gatherings with Friends and Family: Not on file  . Attends Religious Services: Not on file  . Active Member of Clubs or Organizations: Not on file  . Attends Archivist Meetings: Not on file  . Marital Status: Not on file  Intimate Partner Violence:   . Fear of Current or Ex-Partner: Not on file  . Emotionally Abused: Not on file  . Physically Abused: Not on file  . Sexually Abused: Not on file    Family History  Problem Relation Age of Onset  . Heart disease Mother        s/p stent  . Hypertension Mother   . Hypercholesterolemia Mother   . Diabetes Father   . Stomach cancer Other        uncle  . Breast cancer Cousin   . Breast cancer Paternal Aunt      Current Outpatient Medications:  .  Bioflavonoid Products (ESTER C PO), Take 1 tablet by mouth 3 (three) times daily., Disp: , Rfl:  .  Calcium-Magnesium-Zinc (CAL-MAG-ZINC PO), Take 1 tablet by mouth daily., Disp: , Rfl:  .  cetirizine (ZYRTEC) 5 MG tablet, Take 1 tablet (5 mg total) by mouth daily. (Patient taking differently: Take 5 mg by mouth daily as needed (runny nose.). ), Disp: 30 tablet,  Rfl: 0 .  CHLORASEPTIC MAX SORE THROAT 1.5-33 % LIQD, Use as directed 1 spray in the mouth or throat as needed (throat irritation.)., Disp: , Rfl:  .  Cholecalciferol (VITAMIN D3) 25 MCG (1000 UT) CHEW, Chew 5,000 Units by mouth daily., Disp: , Rfl:  .  Coenzyme Q10 (CO Q 10) 100 MG CAPS, Take 100 mg by mouth daily. , Disp: , Rfl:  .  Cyanocobalamin (VITAMIN B-12) 2500 MCG SUBL, Place 2,500 mcg under  the tongue daily., Disp: , Rfl:  .  cyclobenzaprine (FLEXERIL) 10 MG tablet, Take 10 mg by mouth at bedtime. , Disp: , Rfl:  .  Ensure (ENSURE), Take 237 mLs by mouth 2 (two) times daily between meals., Disp: , Rfl:  .  ezetimibe (ZETIA) 10 MG tablet, Take 10 mg by mouth at bedtime., Disp: , Rfl:  .  folic acid (FOLVITE) 749 MCG tablet, Take 400 mcg by mouth 2 (two) times daily. , Disp: , Rfl:  .  Garlic 4496 MG CAPS, Take 1,000 mg by mouth daily., Disp: , Rfl:  .  Histamine Dihydrochloride (AUSTRALIAN DREAM ARTHRITIS EX), Apply 1 application topically 4 (four) times daily as needed (pain.)., Disp: , Rfl:  .  HYDROcodone-acetaminophen (NORCO/VICODIN) 5-325 MG tablet, 1-2 tabs po bid prn (Patient taking differently: Take 1 tablet by mouth at bedtime. ), Disp: 10 tablet, Rfl: 0 .  HYDROcodone-homatropine (HYCODAN) 5-1.5 MG/5ML syrup, Take 5 mLs by mouth 3 (three) times daily as needed for cough., Disp: 120 mL, Rfl: 0 .  montelukast (SINGULAIR) 10 MG tablet, Take 10 mg by mouth at bedtime. , Disp: , Rfl:  .  Polyethyl Glycol-Propyl Glycol (SYSTANE) 0.4-0.3 % SOLN, Place 1 drop into both eyes 5 (five) times daily as needed (dry/irritated eyes). , Disp: , Rfl:  .  pyridOXINE (VITAMIN B-6) 100 MG tablet, Take 100 mg by mouth daily., Disp: , Rfl:  .  sucralfate (CARAFATE) 1 g tablet, 1 tablet daily., Disp: , Rfl:  .  vitamin E 400 UNIT capsule, Take 400 Units by mouth daily., Disp: , Rfl:  .  albuterol (PROVENTIL) (2.5 MG/3ML) 0.083% nebulizer solution, Take 2.5 mg by nebulization every 6 (six) hours as  needed for wheezing., Disp: , Rfl:  .  albuterol (VENTOLIN HFA) 108 (90 Base) MCG/ACT inhaler, Inhale 2 puffs into the lungs every 6 (six) hours as needed for wheezing or shortness of breath. (Patient not taking: Reported on 05/13/2019), Disp: 18 g, Rfl: 0 .  DHEA 25 MG CAPS, Take 25 mg by mouth daily., Disp: , Rfl:  .  ipratropium (ATROVENT) 0.06 % nasal spray, Place 2 sprays into both nostrils 4 (four) times daily as needed for rhinitis. (Patient not taking: Reported on 05/13/2019), Disp: 15 mL, Rfl: 0 .  meclizine (ANTIVERT) 25 MG tablet, Take 1 tablet (25 mg total) by mouth every 6 (six) hours as needed for dizziness. (Patient not taking: Reported on 05/13/2019), Disp: 30 tablet, Rfl: 0 .  ondansetron (ZOFRAN-ODT) 4 MG disintegrating tablet, Take 4 mg by mouth every 8 (eight) hours as needed for nausea/vomiting., Disp: , Rfl:  .  umeclidinium-vilanterol (ANORO ELLIPTA) 62.5-25 MCG/INH AEPB, Inhale into the lungs., Disp: , Rfl:  .  Zoledronic Acid (RECLAST IV), Inject 1 Dose into the vein as directed. ONCE A YEAR, Disp: , Rfl:   Physical exam:  Vitals:   05/14/19 0921  BP: 127/76  Pulse: 72  Temp: 97.7 F (36.5 C)  TempSrc: Tympanic  Weight: 166 lb 8 oz (75.5 kg)   Physical Exam Constitutional:      General: She is not in acute distress.    Comments: Sitting in a wheelchair  HENT:     Head: Normocephalic and atraumatic.  Eyes:     Pupils: Pupils are equal, round, and reactive to light.  Cardiovascular:     Rate and Rhythm: Normal rate and regular rhythm.     Heart sounds: Normal heart sounds.  Pulmonary:     Effort: Pulmonary effort is normal.  Breath sounds: Normal breath sounds.  Abdominal:     General: Bowel sounds are normal.     Palpations: Abdomen is soft.  Musculoskeletal:     Cervical back: Normal range of motion.  Skin:    General: Skin is warm and dry.  Neurological:     Mental Status: She is alert and oriented to person, place, and time.      CMP Latest Ref Rng  & Units 05/14/2019  Glucose 70 - 99 mg/dL 97  BUN 8 - 23 mg/dL 12  Creatinine 0.44 - 1.00 mg/dL 0.41(L)  Sodium 135 - 145 mmol/L 139  Potassium 3.5 - 5.1 mmol/L 4.1  Chloride 98 - 111 mmol/L 104  CO2 22 - 32 mmol/L 25  Calcium 8.9 - 10.3 mg/dL 8.8(L)  Total Protein 6.5 - 8.1 g/dL 6.5  Total Bilirubin 0.3 - 1.2 mg/dL 0.5  Alkaline Phos 38 - 126 U/L 69  AST 15 - 41 U/L 17  ALT 0 - 44 U/L 14   CBC Latest Ref Rng & Units 05/14/2019  WBC 4.0 - 10.5 K/uL 3.2(L)  Hemoglobin 12.0 - 15.0 g/dL 11.4(L)  Hematocrit 36.0 - 46.0 % 35.5(L)  Platelets 150 - 400 K/uL 209    No images are attached to the encounter.  MR Brain W Wo Contrast  Addendum Date: 05/09/2019   ADDENDUM REPORT: 05/09/2019 20:03 ADDENDUM: Clinical request for specific comment about the pituitary gland. Of course this was evaluated on the previous study, but specifically the pituitary gland itself is normal in size, shape and enhancement characteristics. The infundibulum and hypothalamus are normal. No edema or enhancement. Electronically Signed   By: Nelson Chimes M.D.   On: 05/09/2019 20:03   Result Date: 05/09/2019 CLINICAL DATA:  Dizziness.  History of lung cancer. EXAM: MRI HEAD WITHOUT AND WITH CONTRAST TECHNIQUE: Multiplanar, multiecho pulse sequences of the brain and surrounding structures were obtained without and with intravenous contrast. CONTRAST:  22m GADAVIST GADOBUTROL 1 MMOL/ML IV SOLN COMPARISON:  01/09/2019 FINDINGS: Brain: Diffusion imaging does not show any acute or subacute infarction or other cause of restricted diffusion. The brain has normal appearance for age without evidence of atrophy, old or acute small or large vessel infarction, primary or metastatic mass lesion, hemorrhage, hydrocephalus or extra-axial collection. After contrast administration, no abnormal enhancement occurs. Vascular: Major vessels at the base of the brain show flow. Skull and upper cervical spine: Negative Sinuses/Orbits: Clear/normal.   Previous bilateral lens implants. Other: None IMPRESSION: Normal study for age. No evidence of metastatic disease. Abnormality seen to explain the clinical presentation. Electronically Signed: By: MNelson ChimesM.D. On: 05/08/2019 17:41     Assessment and plan- Patient is a 73y.o. female with stage III adenocarcinoma of the lung.  She is here for on treatment assessment prior to cycle 3 of maintenance durvalumab  Counts okay to proceed with maintenance durvalumab cycle 3 today.  I will see her back in 2 weeks for cycle 4  Headache/dizziness: Possibly secondary to inner ear etiology.  She will be seeing ENT soon.  We did get an MRI brain did not show any acute pathology.  I specifically also look for any concern for hypophysitis including checking ACTH and cortisol levels which are unremarkable.  I therefore do not think that her symptoms are related to lung cancer.  If no inner ear etiology discernible neurology referral would be appropriate at that time.  She will continue as needed meclizine for now   Visit Diagnosis 1. Encounter  for antineoplastic immunotherapy   2. Malignant neoplasm of upper lobe of left lung (HCC)   3. Vertigo      Dr. Randa Evens, MD, MPH [facility] at [facility] [phone] 05/15/2019 4:37 PM

## 2019-05-24 ENCOUNTER — Encounter: Payer: Self-pay | Admitting: Oncology

## 2019-05-28 ENCOUNTER — Encounter: Payer: Self-pay | Admitting: Oncology

## 2019-05-28 ENCOUNTER — Inpatient Hospital Stay: Payer: Medicare Other

## 2019-05-28 ENCOUNTER — Other Ambulatory Visit: Payer: Self-pay

## 2019-05-28 ENCOUNTER — Inpatient Hospital Stay (HOSPITAL_BASED_OUTPATIENT_CLINIC_OR_DEPARTMENT_OTHER): Payer: Medicare Other | Admitting: Oncology

## 2019-05-28 VITALS — BP 124/68 | HR 72 | Temp 96.4°F | Resp 16 | Wt 166.9 lb

## 2019-05-28 DIAGNOSIS — C3412 Malignant neoplasm of upper lobe, left bronchus or lung: Secondary | ICD-10-CM

## 2019-05-28 DIAGNOSIS — Z5112 Encounter for antineoplastic immunotherapy: Secondary | ICD-10-CM | POA: Diagnosis not present

## 2019-05-28 DIAGNOSIS — Z5111 Encounter for antineoplastic chemotherapy: Secondary | ICD-10-CM | POA: Diagnosis not present

## 2019-05-28 LAB — CBC WITH DIFFERENTIAL/PLATELET
Abs Immature Granulocytes: 0.03 10*3/uL (ref 0.00–0.07)
Basophils Absolute: 0 10*3/uL (ref 0.0–0.1)
Basophils Relative: 0 %
Eosinophils Absolute: 0 10*3/uL (ref 0.0–0.5)
Eosinophils Relative: 0 %
HCT: 36.7 % (ref 36.0–46.0)
Hemoglobin: 12.2 g/dL (ref 12.0–15.0)
Immature Granulocytes: 0 %
Lymphocytes Relative: 10 %
Lymphs Abs: 0.8 10*3/uL (ref 0.7–4.0)
MCH: 30.8 pg (ref 26.0–34.0)
MCHC: 33.2 g/dL (ref 30.0–36.0)
MCV: 92.7 fL (ref 80.0–100.0)
Monocytes Absolute: 0.3 10*3/uL (ref 0.1–1.0)
Monocytes Relative: 4 %
Neutro Abs: 6.5 10*3/uL (ref 1.7–7.7)
Neutrophils Relative %: 86 %
Platelets: 256 10*3/uL (ref 150–400)
RBC: 3.96 MIL/uL (ref 3.87–5.11)
RDW: 11.9 % (ref 11.5–15.5)
WBC: 7.6 10*3/uL (ref 4.0–10.5)
nRBC: 0 % (ref 0.0–0.2)

## 2019-05-28 LAB — COMPREHENSIVE METABOLIC PANEL
ALT: 22 U/L (ref 0–44)
AST: 22 U/L (ref 15–41)
Albumin: 4.5 g/dL (ref 3.5–5.0)
Alkaline Phosphatase: 79 U/L (ref 38–126)
Anion gap: 10 (ref 5–15)
BUN: 15 mg/dL (ref 8–23)
CO2: 24 mmol/L (ref 22–32)
Calcium: 9.6 mg/dL (ref 8.9–10.3)
Chloride: 104 mmol/L (ref 98–111)
Creatinine, Ser: 0.5 mg/dL (ref 0.44–1.00)
GFR calc Af Amer: >60 mL/min (ref >60)
GFR calc non Af Amer: >60 mL/min (ref >60)
Glucose, Bld: 127 mg/dL — ABNORMAL HIGH (ref 70–99)
Potassium: 4.3 mmol/L (ref 3.5–5.1)
Sodium: 138 mmol/L (ref 135–145)
Total Bilirubin: 0.5 mg/dL (ref 0.3–1.2)
Total Protein: 7.3 g/dL (ref 6.5–8.1)

## 2019-05-28 MED ORDER — SODIUM CHLORIDE 0.9 % IV SOLN
10.0000 mg/kg | Freq: Once | INTRAVENOUS | Status: AC
  Administered 2019-05-28: 740 mg via INTRAVENOUS
  Filled 2019-05-28: qty 10

## 2019-05-28 MED ORDER — SODIUM CHLORIDE 0.9% FLUSH
10.0000 mL | INTRAVENOUS | Status: DC | PRN
  Administered 2019-05-28: 10 mL via INTRAVENOUS
  Filled 2019-05-28: qty 10

## 2019-05-28 MED ORDER — HEPARIN SOD (PORK) LOCK FLUSH 100 UNIT/ML IV SOLN
500.0000 [IU] | Freq: Once | INTRAVENOUS | Status: AC
  Administered 2019-05-28: 500 [IU] via INTRAVENOUS
  Filled 2019-05-28: qty 5

## 2019-05-28 MED ORDER — SODIUM CHLORIDE 0.9 % IV SOLN
Freq: Once | INTRAVENOUS | Status: AC
  Filled 2019-05-28: qty 250

## 2019-05-28 MED ORDER — HEPARIN SOD (PORK) LOCK FLUSH 100 UNIT/ML IV SOLN
500.0000 [IU] | Freq: Once | INTRAVENOUS | Status: DC | PRN
  Filled 2019-05-28: qty 5

## 2019-05-28 NOTE — Progress Notes (Signed)
Pt here for treatment. Still hoarse and she has book for a vitamin she wants to take

## 2019-05-30 NOTE — Progress Notes (Signed)
Hematology/Oncology Consult note [facility]  Telephone:([phone] [phone]  Patient Care Team: [name], MD as PCP - General (Family Medicine) Telford Nab, RN as Registered Nurse   Name of the patient: Jennifer Hodges  [id number]  23-Nov-1945   Date of visit: 05/30/19  Diagnosis- Adenocarcinoma of the lung stage III T1b N2 M0  Chief complaint/ Reason for visit-on treatment assessment prior to cycle 4 of maintenance durvalumab  Heme/Onc history: patient is a 74 year old female who was seen by pulmonary and ENT for ongoing symptoms of bronchitis and possible laryngitis and received antibiotics for the same.She underwent CT chest in September 2020 which showed multiple bilateral lung nodules with associated mediastinal adenopathy and possible primary left upper lobe lung mass. This was followed by a PET CT scan which showed a left upper lobe lung mass measuring 1.9 x 1.2 cm with an SUV of 10.7. Conglomerate AP window adenopathy measuring 5.8 x 2.1 cm with an SUV of 14. She was also noted to have hypermetabolic left hilar adenopathy and left supraclavicular 0.7 cm lymph node with an SUV of 4.6. Noted to have a 7 mm right lower lobe lung nodule with faint metabolic activity. No evidence of other distant metastatic disease. This was followed by a CT super D chest without contrast. The right lower lobe lung nodules were somewhat larger as compared to September 2020. 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  HENT: Negative for congestion, ear discharge and nosebleeds.   Eyes: Negative for blurred vision.  Respiratory: Negative for cough, hemoptysis, sputum production, shortness of breath and wheezing.   Cardiovascular: Negative for chest pain, palpitations, orthopnea and claudication.  Gastrointestinal: Negative for abdominal pain, blood in stool, constipation, diarrhea, heartburn, melena, nausea and vomiting.  Genitourinary: Negative for dysuria, flank pain, frequency, hematuria and urgency.  Musculoskeletal: Negative for back pain, joint pain and myalgias.  Skin: Negative for rash.  Neurological: Positive for dizziness (vertigo) and headaches. Negative for tingling, focal weakness, seizures and weakness.  Endo/Heme/Allergies: Does not bruise/bleed easily.  Psychiatric/Behavioral: Negative for depression and suicidal ideas. The patient does not have insomnia.       Allergies  Allergen Reactions  . Aspirin Hives and Other (See Comments)    Difficulty breathing  . Celebrex [Celecoxib] Shortness Of Breath  . Morphine And Related Nausea And Vomiting and Swelling  . Adhesive [Tape] Other (See Comments)    Took top layer of skin off when removed.  . Clarithromycin Nausea And Vomiting  . Codeine Nausea And Vomiting  . Darvon [Propoxyphene Hcl] Nausea And Vomiting  . Demerol [Meperidine] Nausea And Vomiting  . Flonase [Fluticasone Propionate] Other (See Comments)    Fungal infection  . Simvastatin Other (See Comments)    "caused ulcers in mouth, and fever"  . Talwin [Pentazocine] Nausea And Vomiting     Past Medical History:  Diagnosis Date  .  Anemia   . Asthma    No Inhalers--Dr. [name] will order as needed  . Bronchiectasis  (HCC)    mild  . Chronic headaches     followed by [facility] migraines  . COPD (chronic obstructive pulmonary disease) ([place])   . DDD (degenerative disc disease), lumbar   . Diabetes mellitus without complication [facility])    "doctor says I no longer have diabetes"    . Diverticulosis   . Family history of adverse reaction to anesthesia    PONV  . Gall stones    history of  . GERD (gastroesophageal reflux disease)    EGD 8/09- non bleeding erosive gastritis, documentd esophageal ulcerations.   . Hiatal hernia   . History of pneumonia   . Hypercholesterolemia   . IBS (irritable bowel syndrome)   . Malignant neoplasm of upper lobe of left lung ([place]) 12/27/2018  . Murmur   . Osteoarthritis    lumbar disc disease, left hip  . PONV (postoperative nausea and vomiting)    "Only with last hip and I believe it was due to the morphine"   . Sleep apnea    cpap asked to bring mask and tubing  . Vertigo   . Weakness of right side of body      Past Surgical History:  Procedure Laterality Date  . ABDOMINAL HYSTERECTOMY  age 46  . ANTERIOR CERVICAL DECOMP/DISCECTOMY FUSION N/A 02/24/2015   Procedure: CERVICAL FOUR-FIVE, CERVICAL FIVE-SIX, CERVICAL SIX-SEVEN ANTERIOR CERVICAL DECOMPRESSION/DISCECTOMY FUSION ;  Surgeon: [name], MD;  Location: [place] [facility];  Service: Neurosurgery;  Laterality: N/A;  C45 C56 C67 anterior cervical decompression with fusion interbody prosthesis plating and bonegraft  . APPENDECTOMY    . BACK SURGERY     4th lumbar fusion  . BLADDER SURGERY N/A    with vaginal wall repair  . BREAST CYST ASPIRATION Bilateral    neg  . BREAST SURGERY Bilateral    cyst removed and reduction  . CARDIAC CATHETERIZATION  2014  . CARPAL TUNNEL RELEASE Right 02/11/2016   Procedure: CARPAL TUNNEL RELEASE;  Surgeon: [name], MD;  Location: [facility];  Service: Orthopedics;  Laterality: Right;  . CATARACT EXTRACTION W/ INTRAOCULAR LENS IMPLANT Bilateral 2015  .  CHOLECYSTECTOMY    . EUS N/A 05/31/2012   Procedure: UPPER ENDOSCOPIC ULTRASOUND (EUS) LINEAR;  Surgeon: [name], MD;  Location: [facility];  Service: Endoscopy;  Laterality: N/A;  . EXCISIONAL HEMORRHOIDECTOMY    . JOINT REPLACEMENT Bilateral   . KNEE ARTHROSCOPY WITH LATERAL MENISECTOMY Right 07/07/2015   Procedure: KNEE ARTHROSCOPY WITH LATERAL MENISECTOMY, PARTIAL SYNOVECTOMY;  Surgeon: [name], MD;  Location: [facility];  Service: Orthopedics;  Laterality: Right;  . LUMBAR LAMINECTOMY    . PORTA CATH INSERTION N/A 01/07/2019   Procedure: PORTA CATH INSERTION;  Surgeon: [name], MD;  Location: [facility];  Service: Cardiovascular;  Laterality: N/A;  . REDUCTION MAMMAPLASTY  1990  . RIGHT OOPHORECTOMY    . TOTAL HIP ARTHROPLASTY Left 05/01/2014   Dr. [name]  . TOTAL HIP ARTHROPLASTY Right 08/05/2014   Procedure: TOTAL HIP ARTHROPLASTY ANTERIOR APPROACH;  Surgeon: [name], MD;  Location: [facility];  Service: Orthopedics;  Laterality: Right;  . ULNAR NERVE TRANSPOSITION Right 02/11/2016   Procedure: ULNAR NERVE DECOMPRESSION/TRANSPOSITION;  Surgeon: [name], MD;  Location: [facility];  Service: Orthopedics;  Laterality: Right;  Marland Kitchen VIDEO BRONCHOSCOPY WITH ENDOBRONCHIAL ULTRASOUND Left 12/19/2018   Procedure: VIDEO BRONCHOSCOPY WITH ENDOBRONCHIAL ULTRASOUND, LEFT,  SLEEP APNEA;  Surgeon: [name], MD;  Location: [facility];  Service: Thoracic;  Laterality: Left;    Social History   Socioeconomic History  . Marital status: Married    Spouse name: Not on file  . Number of children: Not on file  . Years of education: Not on file  . Highest education level: Not on file  Occupational History  . Not on file  Tobacco Use  . Smoking status: Former Smoker    Quit date: 06/13/2007    Years since quitting: 11.9  . Smokeless tobacco: Never Used  Substance and Sexual Activity  . Alcohol use: No    Alcohol/week: 0.0 standard drinks  . Drug use: No  .  Sexual activity: Never  Other Topics Concern  . Not on file  Social History Narrative  . Not on file   Social Determinants of Health   Financial Resource Strain:   . Difficulty of Paying Living Expenses:   Food Insecurity:   . Worried About Charity fundraiser in the Last Year:   . Arboriculturist in the Last Year:   Transportation Needs:   . Film/video editor (Medical):   Marland Kitchen Lack of Transportation (Non-Medical):   Physical Activity:   . Days of Exercise per Week:   . Minutes of Exercise per Session:   Stress:   . Feeling of Stress :   Social Connections:   . Frequency of Communication with Friends and Family:   . Frequency of Social Gatherings with Friends and Family:   . Attends Religious Services:   . Active Member of Clubs or Organizations:   . Attends Archivist Meetings:   Marland Kitchen Marital Status:   Intimate Partner Violence:   . Fear of Current or Ex-Partner:   . Emotionally Abused:   Marland Kitchen Physically Abused:   . Sexually Abused:     Family History  Problem Relation Age of Onset  . Heart disease Mother        s/p stent  . Hypertension Mother   . Hypercholesterolemia Mother   . Diabetes Father   . Stomach cancer Other        uncle  . Breast cancer Cousin   . Breast cancer Paternal Aunt      Current Outpatient Medications:  .  albuterol (PROVENTIL) (2.5 MG/3ML) 0.083% nebulizer solution, Take 2.5 mg by nebulization every 6 (six) hours as needed for wheezing., Disp: , Rfl:  .  albuterol (VENTOLIN HFA) 108 (90 Base) MCG/ACT inhaler, Inhale 2 puffs into the lungs every 6 (six) hours as needed for wheezing or shortness of breath., Disp: 18 g, Rfl: 0 .  Bioflavonoid Products (ESTER C PO), Take 1 tablet by mouth 3 (three) times daily., Disp: , Rfl:  .  Calcium-Magnesium-Zinc (CAL-MAG-ZINC PO), Take 1 tablet by mouth daily., Disp: , Rfl:  .  cetirizine (ZYRTEC) 5 MG tablet, Take 1 tablet (5 mg total) by mouth daily. (Patient taking differently: Take 5 mg by  mouth daily as needed (runny nose.). ), Disp: 30 tablet, Rfl: 0 .  CHLORASEPTIC MAX SORE THROAT 1.5-33 % LIQD, Use as directed 1 spray in the mouth or throat as needed (throat irritation.)., Disp: , Rfl:  .  Cholecalciferol (EQL VITAMIN D3) 25 MCG (1000 UT) capsule, Take 4,000 Units by mouth daily., Disp: , Rfl:  .  Coenzyme Q10 (CO Q 10) 100 MG CAPS, Take 100 mg by mouth daily. , Disp: , Rfl:  .  Cyanocobalamin (VITAMIN B-12)  2500 MCG SUBL, Place 2,500 mcg under the tongue daily., Disp: , Rfl:  .  cyclobenzaprine (FLEXERIL) 10 MG tablet, Take 10 mg by mouth at bedtime. , Disp: , Rfl:  .  DHEA 25 MG CAPS, Take 25 mg by mouth daily., Disp: , Rfl:  .  Ensure (ENSURE), Take 237 mLs by mouth 2 (two) times daily between meals., Disp: , Rfl:  .  ezetimibe (ZETIA) 10 MG tablet, Take 10 mg by mouth at bedtime., Disp: , Rfl:  .  folic acid (FOLVITE) 932 MCG tablet, Take 400 mcg by mouth 2 (two) times daily. , Disp: , Rfl:  .  Garlic 3557 MG CAPS, Take 1,000 mg by mouth daily., Disp: , Rfl:  .  Histamine Dihydrochloride (AUSTRALIAN DREAM ARTHRITIS EX), Apply 1 application topically 4 (four) times daily as needed (pain.)., Disp: , Rfl:  .  HYDROcodone-acetaminophen (NORCO/VICODIN) 5-325 MG tablet, 1-2 tabs po bid prn (Patient taking differently: Take 1 tablet by mouth at bedtime. ), Disp: 10 tablet, Rfl: 0 .  HYDROcodone-homatropine (HYCODAN) 5-1.5 MG/5ML syrup, Take 5 mLs by mouth 3 (three) times daily as needed for cough., Disp: 120 mL, Rfl: 0 .  ipratropium (ATROVENT) 0.06 % nasal spray, Place 2 sprays into both nostrils 4 (four) times daily as needed for rhinitis., Disp: 15 mL, Rfl: 0 .  meclizine (ANTIVERT) 25 MG tablet, Take 1 tablet (25 mg total) by mouth every 6 (six) hours as needed for dizziness., Disp: 30 tablet, Rfl: 0 .  Polyethyl Glycol-Propyl Glycol (SYSTANE) 0.4-0.3 % SOLN, Place 1 drop into both eyes 5 (five) times daily as needed (dry/irritated eyes). , Disp: , Rfl:  .  pyridOXINE (VITAMIN  B-6) 100 MG tablet, Take 100 mg by mouth daily., Disp: , Rfl:  .  Selenium 200 MCG CAPS, Take 200 mcg by mouth daily., Disp: , Rfl:  .  sucralfate (CARAFATE) 1 g tablet, 1 tablet daily., Disp: , Rfl:  .  umeclidinium-vilanterol (ANORO ELLIPTA) 62.5-25 MCG/INH AEPB, Inhale 1 puff into the lungs as needed. , Disp: , Rfl:  .  vitamin E 400 UNIT capsule, Take 400 Units by mouth daily., Disp: , Rfl:  .  montelukast (SINGULAIR) 10 MG tablet, Take 10 mg by mouth at bedtime. , Disp: , Rfl:  .  ondansetron (ZOFRAN-ODT) 4 MG disintegrating tablet, Take 4 mg by mouth every 8 (eight) hours as needed for nausea/vomiting., Disp: , Rfl:  .  Zoledronic Acid (RECLAST IV), Inject 1 Dose into the vein as directed. ONCE A YEAR, Disp: , Rfl:   Physical exam:  Vitals:   05/28/19 0953  BP: 124/68  Pulse: 72  Resp: 16  Temp: (!) 96.4 F (35.8 C)  TempSrc: Tympanic  Weight: 166 lb 14.4 oz (75.7 kg)   Physical Exam HENT:     Head: Normocephalic and atraumatic.  Eyes:     Pupils: Pupils are equal, round, and reactive to light.  Cardiovascular:     Rate and Rhythm: Normal rate and regular rhythm.     Heart sounds: Normal heart sounds.  Pulmonary:     Effort: Pulmonary effort is normal.     Breath sounds: Normal breath sounds.  Abdominal:     General: Bowel sounds are normal.     Palpations: Abdomen is soft.  Musculoskeletal:     Cervical back: Normal range of motion.  Skin:    General: Skin is warm and dry.  Neurological:     Mental Status: She is alert and oriented to person, place,  and time.      CMP Latest Ref Rng & Units 05/28/2019  Glucose 70 - 99 mg/dL 127(H)  BUN 8 - 23 mg/dL 15  Creatinine 0.44 - 1.00 mg/dL 0.50  Sodium 135 - 145 mmol/L 138  Potassium 3.5 - 5.1 mmol/L 4.3  Chloride 98 - 111 mmol/L 104  CO2 22 - 32 mmol/L 24  Calcium 8.9 - 10.3 mg/dL 9.6  Total Protein 6.5 - 8.1 g/dL 7.3  Total Bilirubin 0.3 - 1.2 mg/dL 0.5  Alkaline Phos 38 - 126 U/L 79  AST 15 - 41 U/L 22  ALT 0 -  44 U/L 22   CBC Latest Ref Rng & Units 05/28/2019  WBC 4.0 - 10.5 K/uL 7.6  Hemoglobin 12.0 - 15.0 g/dL 12.2  Hematocrit 36.0 - 46.0 % 36.7  Platelets 150 - 400 K/uL 256    No images are attached to the encounter.  MR Brain W Wo Contrast  Addendum Date: 05/09/2019   ADDENDUM REPORT: 05/09/2019 20:03 ADDENDUM: Clinical request for specific comment about the pituitary gland. Of course this was evaluated on the previous study, but specifically the pituitary gland itself is normal in size, shape and enhancement characteristics. The infundibulum and hypothalamus are normal. No edema or enhancement. Electronically Signed   By: Nelson Chimes M.D.   On: 05/09/2019 20:03   Result Date: 05/09/2019 CLINICAL DATA:  Dizziness.  History of lung cancer. EXAM: MRI HEAD WITHOUT AND WITH CONTRAST TECHNIQUE: Multiplanar, multiecho pulse sequences of the brain and surrounding structures were obtained without and with intravenous contrast. CONTRAST:  69m GADAVIST GADOBUTROL 1 MMOL/ML IV SOLN COMPARISON:  01/09/2019 FINDINGS: Brain: Diffusion imaging does not show any acute or subacute infarction or other cause of restricted diffusion. The brain has normal appearance for age without evidence of atrophy, old or acute small or large vessel infarction, primary or metastatic mass lesion, hemorrhage, hydrocephalus or extra-axial collection. After contrast administration, no abnormal enhancement occurs. Vascular: Major vessels at the base of the brain show flow. Skull and upper cervical spine: Negative Sinuses/Orbits: Clear/normal.  Previous bilateral lens implants. Other: None IMPRESSION: Normal study for age. No evidence of metastatic disease. Abnormality seen to explain the clinical presentation. Electronically Signed: By: MNelson ChimesM.D. On: 05/08/2019 17:41     Assessment and plan- Patient is a 74y.o. female with stage III adenocarcinoma of the lung.    Patient is here for on treatment assessment prior to cycle 4 of  maintenance durvalumab  Counts are okay to proceed with cycle 4 of maintenance durvalumab today.  I will see her back in 2 weeks for cycle 5.  Plan to get repeat scans after cycle 6.  Headache/dizziness: She has been following up with ENT and etiology is likely vertigo secondary to inner ear pathology for which she is undergoing tilt table for therapeutic use as well. Continue prn meclizine   Visit Diagnosis 1. Encounter for antineoplastic chemotherapy   2. [place]      Dr. ARanda Evens MD, MPH [id number]/18/2021 8:05 PM

## 2019-06-11 ENCOUNTER — Other Ambulatory Visit: Payer: Self-pay

## 2019-06-11 ENCOUNTER — Inpatient Hospital Stay: Payer: Medicare Other

## 2019-06-11 ENCOUNTER — Inpatient Hospital Stay (HOSPITAL_BASED_OUTPATIENT_CLINIC_OR_DEPARTMENT_OTHER): Payer: Medicare Other | Admitting: Oncology

## 2019-06-11 ENCOUNTER — Encounter: Payer: Self-pay | Admitting: Oncology

## 2019-06-11 VITALS — BP 126/65 | HR 77 | Temp 97.3°F | Resp 16 | Ht 62.0 in | Wt 161.0 lb

## 2019-06-11 DIAGNOSIS — R42 Dizziness and giddiness: Secondary | ICD-10-CM | POA: Diagnosis not present

## 2019-06-11 DIAGNOSIS — Z5112 Encounter for antineoplastic immunotherapy: Secondary | ICD-10-CM | POA: Diagnosis not present

## 2019-06-11 DIAGNOSIS — C3412 Malignant neoplasm of upper lobe, left bronchus or lung: Secondary | ICD-10-CM | POA: Diagnosis not present

## 2019-06-11 DIAGNOSIS — G43019 Migraine without aura, intractable, without status migrainosus: Secondary | ICD-10-CM | POA: Diagnosis not present

## 2019-06-11 LAB — CBC WITH DIFFERENTIAL/PLATELET
Abs Immature Granulocytes: 0 10*3/uL (ref 0.00–0.07)
Basophils Absolute: 0 10*3/uL (ref 0.0–0.1)
Basophils Relative: 1 %
Eosinophils Absolute: 0.1 10*3/uL (ref 0.0–0.5)
Eosinophils Relative: 2 %
HCT: 35.9 % — ABNORMAL LOW (ref 36.0–46.0)
Hemoglobin: 12.3 g/dL (ref 12.0–15.0)
Immature Granulocytes: 0 %
Lymphocytes Relative: 18 %
Lymphs Abs: 0.7 10*3/uL (ref 0.7–4.0)
MCH: 31.4 pg (ref 26.0–34.0)
MCHC: 34.3 g/dL (ref 30.0–36.0)
MCV: 91.6 fL (ref 80.0–100.0)
Monocytes Absolute: 0.3 10*3/uL (ref 0.1–1.0)
Monocytes Relative: 9 %
Neutro Abs: 2.7 10*3/uL (ref 1.7–7.7)
Neutrophils Relative %: 70 %
Platelets: 199 10*3/uL (ref 150–400)
RBC: 3.92 MIL/uL (ref 3.87–5.11)
RDW: 12.3 % (ref 11.5–15.5)
WBC: 3.7 10*3/uL — ABNORMAL LOW (ref 4.0–10.5)
nRBC: 0 % (ref 0.0–0.2)

## 2019-06-11 LAB — COMPREHENSIVE METABOLIC PANEL
ALT: 18 U/L (ref 0–44)
AST: 20 U/L (ref 15–41)
Albumin: 4.3 g/dL (ref 3.5–5.0)
Alkaline Phosphatase: 76 U/L (ref 38–126)
Anion gap: 8 (ref 5–15)
BUN: 12 mg/dL (ref 8–23)
CO2: 26 mmol/L (ref 22–32)
Calcium: 8.8 mg/dL — ABNORMAL LOW (ref 8.9–10.3)
Chloride: 103 mmol/L (ref 98–111)
Creatinine, Ser: 0.49 mg/dL (ref 0.44–1.00)
GFR calc Af Amer: >60 mL/min (ref >60)
GFR calc non Af Amer: >60 mL/min (ref >60)
Glucose, Bld: 98 mg/dL (ref 70–99)
Potassium: 3.8 mmol/L (ref 3.5–5.1)
Sodium: 137 mmol/L (ref 135–145)
Total Bilirubin: 0.5 mg/dL (ref 0.3–1.2)
Total Protein: 6.7 g/dL (ref 6.5–8.1)

## 2019-06-11 LAB — VITAMIN B12: Vitamin B-12: 1008 pg/mL — ABNORMAL HIGH (ref 180–914)

## 2019-06-11 LAB — VITAMIN D 25 HYDROXY (VIT D DEFICIENCY, FRACTURES): Vit D, 25-Hydroxy: 44.82 ng/mL (ref 30–100)

## 2019-06-11 MED ORDER — MECLIZINE HCL 25 MG PO TABS: 25.0000 mg | ORAL_TABLET | Freq: Four times a day (QID) | ORAL | 0 refills | Status: DC | PRN

## 2019-06-11 MED ORDER — HEPARIN SOD (PORK) LOCK FLUSH 100 UNIT/ML IV SOLN
INTRAVENOUS | Status: AC
  Filled 2019-06-11: qty 5

## 2019-06-11 MED ORDER — SODIUM CHLORIDE 0.9 % IV SOLN
Freq: Once | INTRAVENOUS | Status: AC
  Filled 2019-06-11: qty 250

## 2019-06-11 MED ORDER — SODIUM CHLORIDE 0.9% FLUSH
10.0000 mL | Freq: Once | INTRAVENOUS | Status: AC
  Administered 2019-06-11: 10 mL via INTRAVENOUS
  Filled 2019-06-11: qty 10

## 2019-06-11 MED ORDER — SODIUM CHLORIDE 0.9 % IV SOLN
10.0000 mg/kg | Freq: Once | INTRAVENOUS | Status: AC
  Administered 2019-06-11: 740 mg via INTRAVENOUS
  Filled 2019-06-11: qty 10

## 2019-06-11 MED ORDER — HEPARIN SOD (PORK) LOCK FLUSH 100 UNIT/ML IV SOLN
500.0000 [IU] | Freq: Once | INTRAVENOUS | Status: AC | PRN
  Administered 2019-06-11: 500 [IU]
  Filled 2019-06-11: qty 5

## 2019-06-11 NOTE — Progress Notes (Signed)
Pt in wheelchair today she is having vertigo. She is also having family issues with her daughter who wants pt to give a step son her burial plot because he has cancer and does not look good for him. Pt. Can't afford to do it. She is sad and causing stress for her.

## 2019-06-13 ENCOUNTER — Telehealth: Payer: Self-pay

## 2019-06-13 NOTE — Telephone Encounter (Signed)
Per Dr. Janese Banks, lab results were faxed to Dr. Trena Platt office.

## 2019-06-13 NOTE — Telephone Encounter (Signed)
-----   Message from Sindy Guadeloupe, MD sent at 06/12/2019  9:59 AM EDT ----- Please send vit D and b12 results to Dr. Manuella Ghazi [facility]

## 2019-06-13 NOTE — Progress Notes (Signed)
Hematology/Oncology Consult note [facility]  Telephone:([phone] [phone]  Patient Care Team: [name], MD as PCP - General (Family Medicine) Telford Nab, RN as Registered Nurse   Name of the patient: Jennifer Hodges  [id number]  [birth year]   Date of visit: 06/13/19  Diagnosis- Adenocarcinoma of the lung stage III T1b N2 M0  Chief complaint/ Reason for visit-on treatment assessment prior to cycle 5 of maintenance durvalumab  Heme/Onc history: patient is a 74 year old female who was seen by pulmonary and ENT for ongoing symptoms of bronchitis and possible laryngitis and received antibiotics for the same.She underwent CT chest in September 2020 which showed multiple bilateral lung nodules with associated mediastinal adenopathy and possible primary left upper lobe lung mass. This was followed by a PET CT scan which showed a left upper lobe lung mass measuring 1.9 x 1.2 cm with an SUV of 10.7. Conglomerate AP window adenopathy measuring 5.8 x 2.1 cm with an SUV of 14. She was also noted to have hypermetabolic left hilar adenopathy and left supraclavicular 0.7 cm lymph node with an SUV of 4.6. Noted to have a 7 mm right lower lobe lung nodule with faint metabolic activity. No evidence of other distant metastatic disease. This was followed by a CT super D chest without contrast. The right lower lobe lung nodules were somewhat larger as compared to September 2020. 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  Has mild fatigue but denies other complaints at this time  ECOG PS- 1 Pain scale- 0   Review of systems- Review of Systems  Constitutional: Positive for malaise/fatigue. Negative for chills, fever and weight loss.  HENT: Negative for congestion, ear discharge and nosebleeds.   Eyes: Negative for blurred vision.  Respiratory: Negative for cough, hemoptysis, sputum production, shortness of breath and wheezing.   Cardiovascular: Negative for chest pain, palpitations, orthopnea and claudication.  Gastrointestinal: Negative for abdominal pain, blood in stool, constipation, diarrhea, heartburn, melena, nausea and vomiting.  Genitourinary: Negative for dysuria, flank pain, frequency, hematuria and urgency.  Musculoskeletal: Negative for back pain, joint pain and myalgias.  Skin: Negative for rash.  Neurological: Negative for dizziness (Vertigo), tingling, focal weakness, seizures, weakness and headaches.  Endo/Heme/Allergies: Does not bruise/bleed easily.  Psychiatric/Behavioral: Negative for depression and suicidal ideas. The patient does not have insomnia.        Allergies  Allergen Reactions  . Aspirin Hives and Other (See Comments)    Difficulty breathing  . Celebrex [Celecoxib] Shortness Of Breath  . Morphine And Related Nausea And Vomiting and Swelling  . Adhesive [Tape] Other (See Comments)    Took top layer of skin off when removed.  . Clarithromycin Nausea And Vomiting  . Codeine Nausea And Vomiting  . Darvon [Propoxyphene Hcl] Nausea And Vomiting  . Demerol [Meperidine] Nausea And Vomiting  . Flonase [Fluticasone Propionate] Other (See Comments)    Fungal infection  . Simvastatin Other (See Comments)    "caused ulcers in mouth, and fever"  . Talwin [Pentazocine] Nausea And Vomiting     Past Medical History:  Diagnosis Date  .  Anemia   . Asthma    No Inhalers--Dr. [name] will order as needed  . Bronchiectasis (HCC)     mild  . Chronic headaches     followed by [facility] migraines  . COPD (chronic obstructive pulmonary disease) ([place])   . DDD (degenerative disc disease), lumbar   . Diabetes mellitus without complication [facility])    "doctor says I no longer have diabetes"    . Diverticulosis   . Family history of adverse reaction to anesthesia    PONV  . Gall stones    history of  . GERD (gastroesophageal reflux disease)    EGD 8/09- non bleeding erosive gastritis, documentd esophageal ulcerations.   . Hiatal hernia   . History of pneumonia   . Hypercholesterolemia   . IBS (irritable bowel syndrome)   . Malignant neoplasm of upper lobe of left lung ([place]) 12/27/2018  . Murmur   . Osteoarthritis    lumbar disc disease, left hip  . PONV (postoperative nausea and vomiting)    "Only with last hip and I believe it was due to the morphine"   . Sleep apnea    cpap asked to bring mask and tubing  . Vertigo   . Weakness of right side of body      Past Surgical History:  Procedure Laterality Date  . ABDOMINAL HYSTERECTOMY  age 19  . ANTERIOR CERVICAL DECOMP/DISCECTOMY FUSION N/A 02/24/2015   Procedure: CERVICAL FOUR-FIVE, CERVICAL FIVE-SIX, CERVICAL SIX-SEVEN ANTERIOR CERVICAL DECOMPRESSION/DISCECTOMY FUSION ;  Surgeon: [name], MD;  Location: [facility];  Service: Neurosurgery;  Laterality: N/A;  C45 C56 C67 anterior cervical decompression with fusion interbody prosthesis plating and bonegraft  . APPENDECTOMY    . BACK SURGERY     4th lumbar fusion  . BLADDER SURGERY N/A    with vaginal wall repair  . BREAST CYST ASPIRATION Bilateral    neg  . BREAST SURGERY Bilateral    cyst removed and reduction  . CARDIAC CATHETERIZATION  2014  . CARPAL TUNNEL RELEASE Right 02/11/2016   Procedure: CARPAL TUNNEL RELEASE;  Surgeon: [name], MD;  Location: [facility] ORS;  Service: Orthopedics;  Laterality: Right;  . CATARACT EXTRACTION W/ INTRAOCULAR LENS IMPLANT Bilateral 2015  .  CHOLECYSTECTOMY    . EUS N/A 05/31/2012   Procedure: UPPER ENDOSCOPIC ULTRASOUND (EUS) LINEAR;  Surgeon: [name], MD;  Location: [facility];  Service: Endoscopy;  Laterality: N/A;  . EXCISIONAL HEMORRHOIDECTOMY    . JOINT REPLACEMENT Bilateral   . KNEE ARTHROSCOPY WITH LATERAL MENISECTOMY Right 07/07/2015   Procedure: KNEE ARTHROSCOPY WITH LATERAL MENISECTOMY, PARTIAL SYNOVECTOMY;  Surgeon: [name], MD;  Location: [facility] ORS;  Service: Orthopedics;  Laterality: Right;  . LUMBAR LAMINECTOMY    . PORTA CATH INSERTION N/A 01/07/2019   Procedure: PORTA CATH INSERTION;  Surgeon: [name], MD;  Location: [facility] CV LAB;  Service: Cardiovascular;  Laterality: N/A;  . REDUCTION MAMMAPLASTY  1990  . RIGHT OOPHORECTOMY    . TOTAL HIP ARTHROPLASTY Left 05/01/2014   Dr. [name]  . TOTAL HIP ARTHROPLASTY Right 08/05/2014   Procedure: TOTAL HIP ARTHROPLASTY ANTERIOR APPROACH;  Surgeon: [name], MD;  Location: [facility] ORS;  Service: Orthopedics;  Laterality: Right;  . ULNAR NERVE TRANSPOSITION Right 02/11/2016   Procedure: ULNAR NERVE DECOMPRESSION/TRANSPOSITION;  Surgeon: [name], MD;  Location: [facility] ORS;  Service: Orthopedics;  Laterality: Right;  Marland Kitchen VIDEO BRONCHOSCOPY WITH ENDOBRONCHIAL ULTRASOUND Left 12/19/2018   Procedure: VIDEO BRONCHOSCOPY WITH ENDOBRONCHIAL ULTRASOUND, LEFT,  SLEEP APNEA;  Surgeon: [name], MD;  Location: [facility] ORS;  Service: Thoracic;  Laterality: Left;    Social History   Socioeconomic History  . Marital status: Married    Spouse name: Not on file  . Number of children: Not on file  . Years of education: Not on file  . Highest education level: Not on file  Occupational History  . Not on file  Tobacco Use  . Smoking status: Former Smoker    Quit date: 06/13/2007    Years since quitting: 12.0  . Smokeless tobacco: Never Used  Substance and Sexual Activity  . Alcohol use: No    Alcohol/week: 0.0 standard drinks  . Drug use: No  .  Sexual activity: Never  Other Topics Concern  . Not on file  Social History Narrative  . Not on file   Social Determinants of Health   Financial Resource Strain:   . Difficulty of Paying Living Expenses:   Food Insecurity:   . Worried About Charity fundraiser in the Last Year:   . Arboriculturist in the Last Year:   Transportation Needs:   . Film/video editor (Medical):   Marland Kitchen Lack of Transportation (Non-Medical):   Physical Activity:   . Days of Exercise per Week:   . Minutes of Exercise per Session:   Stress:   . Feeling of Stress :   Social Connections:   . Frequency of Communication with Friends and Family:   . Frequency of Social Gatherings with Friends and Family:   . Attends Religious Services:   . Active Member of Clubs or Organizations:   . Attends Archivist Meetings:   Marland Kitchen Marital Status:   Intimate Partner Violence:   . Fear of Current or Ex-Partner:   . Emotionally Abused:   Marland Kitchen Physically Abused:   . Sexually Abused:     Family History  Problem Relation Age of Onset  . Heart disease Mother        s/p stent  . Hypertension Mother   . Hypercholesterolemia Mother   . Diabetes Father   . Stomach cancer Other        uncle  . Breast cancer Cousin   . Breast cancer Paternal Aunt      Current Outpatient Medications:  .  albuterol (PROVENTIL) (2.5 MG/3ML) 0.083% nebulizer solution, Take 2.5 mg by nebulization every 6 (six) hours as needed for wheezing., Disp: , Rfl:  .  albuterol (VENTOLIN HFA) 108 (90 Base) MCG/ACT inhaler, Inhale 2 puffs into the lungs every 6 (six) hours as needed for wheezing or shortness of breath., Disp: 18 g, Rfl: 0 .  Bioflavonoid Products (ESTER C PO), Take 1 tablet by mouth 3 (three) times daily., Disp: , Rfl:  .  Calcium-Magnesium-Zinc (CAL-MAG-ZINC PO), Take 1 tablet by mouth daily., Disp: , Rfl:  .  cetirizine (ZYRTEC) 5 MG tablet, Take 1 tablet (5 mg total) by mouth daily. (Patient taking differently: Take 5 mg by  mouth daily as needed (runny nose.). ), Disp: 30 tablet, Rfl: 0 .  CHLORASEPTIC MAX SORE THROAT 1.5-33 % LIQD, Use as directed 1 spray in the mouth or throat as needed (throat irritation.)., Disp: , Rfl:  .  Cholecalciferol (EQL VITAMIN D3) 25 MCG (1000 UT) capsule, Take 4,000 Units by mouth daily., Disp: , Rfl:  .  CHROMIUM PO, Take 1 capsule by mouth daily., Disp: , Rfl:  .  Coenzyme Q10 (CO Q 10) 100 MG CAPS, Take 100  mg by mouth daily. , Disp: , Rfl:  .  Cyanocobalamin (VITAMIN B-12) 2500 MCG SUBL, Place 2,500 mcg under the tongue daily., Disp: , Rfl:  .  cyclobenzaprine (FLEXERIL) 10 MG tablet, Take 10 mg by mouth at bedtime. , Disp: , Rfl:  .  DHEA 25 MG CAPS, Take 25 mg by mouth daily., Disp: , Rfl:  .  Ensure (ENSURE), Take 237 mLs by mouth 2 (two) times daily between meals., Disp: , Rfl:  .  ezetimibe (ZETIA) 10 MG tablet, Take 10 mg by mouth at bedtime., Disp: , Rfl:  .  folic acid (FOLVITE) 314 MCG tablet, Take 400 mcg by mouth 2 (two) times daily. , Disp: , Rfl:  .  Garlic 9702 MG CAPS, Take 1,000 mg by mouth daily., Disp: , Rfl:  .  Histamine Dihydrochloride (AUSTRALIAN DREAM ARTHRITIS EX), Apply 1 application topically 4 (four) times daily as needed (pain.)., Disp: , Rfl:  .  HYDROcodone-acetaminophen (NORCO/VICODIN) 5-325 MG tablet, 1-2 tabs po bid prn (Patient taking differently: Take 1 tablet by mouth at bedtime. ), Disp: 10 tablet, Rfl: 0 .  HYDROcodone-homatropine (HYCODAN) 5-1.5 MG/5ML syrup, Take 5 mLs by mouth 3 (three) times daily as needed for cough., Disp: 120 mL, Rfl: 0 .  ipratropium (ATROVENT) 0.06 % nasal spray, Place 2 sprays into both nostrils 4 (four) times daily as needed for rhinitis., Disp: 15 mL, Rfl: 0 .  meclizine (ANTIVERT) 25 MG tablet, Take 1 tablet (25 mg total) by mouth every 6 (six) hours as needed for dizziness., Disp: 30 tablet, Rfl: 0 .  montelukast (SINGULAIR) 10 MG tablet, Take 10 mg by mouth at bedtime. , Disp: , Rfl:  .  NON FORMULARY, Take 1  capsule by mouth daily., Disp: , Rfl:  .  ondansetron (ZOFRAN-ODT) 4 MG disintegrating tablet, Take 4 mg by mouth every 8 (eight) hours as needed for nausea/vomiting., Disp: , Rfl:  .  Polyethyl Glycol-Propyl Glycol (SYSTANE) 0.4-0.3 % SOLN, Place 1 drop into both eyes 5 (five) times daily as needed (dry/irritated eyes). , Disp: , Rfl:  .  pyridOXINE (VITAMIN B-6) 100 MG tablet, Take 100 mg by mouth daily., Disp: , Rfl:  .  Selenium 200 MCG CAPS, Take 200 mcg by mouth daily., Disp: , Rfl:  .  sucralfate (CARAFATE) 1 g tablet, 1 tablet daily., Disp: , Rfl:  .  umeclidinium-vilanterol (ANORO ELLIPTA) 62.5-25 MCG/INH AEPB, Inhale 1 puff into the lungs as needed. , Disp: , Rfl:  .  vitamin E 400 UNIT capsule, Take 400 Units by mouth daily., Disp: , Rfl:  .  Zoledronic Acid (RECLAST IV), Inject 1 Dose into the vein as directed. ONCE A YEAR, Disp: , Rfl:   Physical exam:  Vitals:   06/11/19 1000  BP: 126/65  Pulse: 77  Resp: 16  Temp: (!) 97.3 F (36.3 C)  TempSrc: Tympanic  Weight: 161 lb (73 kg)  Height: '5\' 2"'$  (1.575 m)   Physical Exam Constitutional:      General: She is not in acute distress. HENT:     Head: Normocephalic and atraumatic.  Eyes:     Pupils: Pupils are equal, round, and reactive to light.  Cardiovascular:     Rate and Rhythm: Normal rate and regular rhythm.     Heart sounds: Normal heart sounds.  Pulmonary:     Effort: Pulmonary effort is normal.     Breath sounds: Normal breath sounds.  Abdominal:     General: Bowel sounds are normal.  Palpations: Abdomen is soft.  Musculoskeletal:     Cervical back: Normal range of motion.  Skin:    General: Skin is warm and dry.  Neurological:     Mental Status: She is alert and oriented to person, place, and time.      CMP Latest Ref Rng & Units 06/11/2019  Glucose 70 - 99 mg/dL 98  BUN 8 - 23 mg/dL 12  Creatinine 0.44 - 1.00 mg/dL 0.49  Sodium 135 - 145 mmol/L 137  Potassium 3.5 - 5.1 mmol/L 3.8  Chloride 98 -  111 mmol/L 103  CO2 22 - 32 mmol/L 26  Calcium 8.9 - 10.3 mg/dL 8.8(L)  Total Protein 6.5 - 8.1 g/dL 6.7  Total Bilirubin 0.3 - 1.2 mg/dL 0.5  Alkaline Phos 38 - 126 U/L 76  AST 15 - 41 U/L 20  ALT 0 - 44 U/L 18   CBC Latest Ref Rng & Units 06/11/2019  WBC 4.0 - 10.5 K/uL 3.7(L)  Hemoglobin 12.0 - 15.0 g/dL 12.3  Hematocrit 36.0 - 46.0 % 35.9(L)  Platelets 150 - 400 K/uL 199      Assessment and plan- Patient is a 74 y.o. female with stage III adenocarcinoma of the lung.  She is here for on treatment assessment prior to cycle 5 of maintenance durvalumab  Counts okay to proceed with cycle 5 of maintenance durvalumab today.  She is tolerating treatment well without any significant side effect such as diarrhea or worsening shortness of breath.  I will see her back in 2 weeks for cycle 5.  Plan to get scans after cycle 6.  Vertigo: Has been longstanding issue.  MRI brain does not show any evidence of metastatic disease.  She will continue as needed meclizine and follow-up with ENT as well.   Visit Diagnosis 1. Malignant neoplasm of upper lobe of left lung ([place])   2. Intractable migraine without aura and without status migrainosus   3. Vertigo      Dr. Randa Evens, MD, MPH [facility] [phone] 06/13/2019 5:16 PM

## 2019-06-18 NOTE — Progress Notes (Signed)
Pharmacist Chemotherapy Monitoring - Follow Up Assessment    I verify that I have reviewed each item in the below checklist:  . Regimen for the patient is scheduled for the appropriate day and plan matches scheduled date. Marland Kitchen Appropriate non-routine labs are ordered dependent on drug ordered. . If applicable, additional medications reviewed and ordered per protocol based on lifetime cumulative doses and/or treatment regimen.   Plan for follow-up and/or issues identified: No . I-vent associated with next due treatment: No . MD and/or nursing notified: No  Jennifer Hodges K 06/18/2019 1:19 PM

## 2019-06-25 ENCOUNTER — Inpatient Hospital Stay: Payer: Medicare Other | Attending: Oncology

## 2019-06-25 ENCOUNTER — Encounter: Payer: Self-pay | Admitting: Oncology

## 2019-06-25 ENCOUNTER — Inpatient Hospital Stay: Payer: Medicare Other

## 2019-06-25 ENCOUNTER — Other Ambulatory Visit: Payer: Self-pay

## 2019-06-25 ENCOUNTER — Inpatient Hospital Stay (HOSPITAL_BASED_OUTPATIENT_CLINIC_OR_DEPARTMENT_OTHER): Payer: Medicare Other | Admitting: Oncology

## 2019-06-25 VITALS — BP 113/66 | HR 77 | Temp 96.5°F | Resp 18 | Wt 161.0 lb

## 2019-06-25 DIAGNOSIS — C3412 Malignant neoplasm of upper lobe, left bronchus or lung: Secondary | ICD-10-CM | POA: Diagnosis present

## 2019-06-25 DIAGNOSIS — Z5112 Encounter for antineoplastic immunotherapy: Secondary | ICD-10-CM | POA: Insufficient documentation

## 2019-06-25 DIAGNOSIS — R42 Dizziness and giddiness: Secondary | ICD-10-CM | POA: Diagnosis not present

## 2019-06-25 DIAGNOSIS — Z95828 Presence of other vascular implants and grafts: Secondary | ICD-10-CM

## 2019-06-25 LAB — CBC WITH DIFFERENTIAL/PLATELET
Abs Immature Granulocytes: 0.01 10*3/uL (ref 0.00–0.07)
Basophils Absolute: 0 10*3/uL (ref 0.0–0.1)
Basophils Relative: 0 %
Eosinophils Absolute: 0.1 10*3/uL (ref 0.0–0.5)
Eosinophils Relative: 3 %
HCT: 36.7 % (ref 36.0–46.0)
Hemoglobin: 12.5 g/dL (ref 12.0–15.0)
Immature Granulocytes: 0 %
Lymphocytes Relative: 18 %
Lymphs Abs: 0.8 10*3/uL (ref 0.7–4.0)
MCH: 30.9 pg (ref 26.0–34.0)
MCHC: 34.1 g/dL (ref 30.0–36.0)
MCV: 90.8 fL (ref 80.0–100.0)
Monocytes Absolute: 0.3 10*3/uL (ref 0.1–1.0)
Monocytes Relative: 7 %
Neutro Abs: 3 10*3/uL (ref 1.7–7.7)
Neutrophils Relative %: 72 %
Platelets: 236 10*3/uL (ref 150–400)
RBC: 4.04 MIL/uL (ref 3.87–5.11)
RDW: 12.2 % (ref 11.5–15.5)
WBC: 4.2 10*3/uL (ref 4.0–10.5)
nRBC: 0 % (ref 0.0–0.2)

## 2019-06-25 LAB — COMPREHENSIVE METABOLIC PANEL
ALT: 18 U/L (ref 0–44)
AST: 20 U/L (ref 15–41)
Albumin: 4.2 g/dL (ref 3.5–5.0)
Alkaline Phosphatase: 79 U/L (ref 38–126)
Anion gap: 11 (ref 5–15)
BUN: 16 mg/dL (ref 8–23)
CO2: 26 mmol/L (ref 22–32)
Calcium: 9.1 mg/dL (ref 8.9–10.3)
Chloride: 100 mmol/L (ref 98–111)
Creatinine, Ser: 0.46 mg/dL (ref 0.44–1.00)
GFR calc Af Amer: >60 mL/min (ref >60)
GFR calc non Af Amer: >60 mL/min (ref >60)
Glucose, Bld: 129 mg/dL — ABNORMAL HIGH (ref 70–99)
Potassium: 3.9 mmol/L (ref 3.5–5.1)
Sodium: 137 mmol/L (ref 135–145)
Total Bilirubin: 0.6 mg/dL (ref 0.3–1.2)
Total Protein: 7 g/dL (ref 6.5–8.1)

## 2019-06-25 MED ORDER — SODIUM CHLORIDE 0.9% FLUSH
10.0000 mL | Freq: Once | INTRAVENOUS | Status: AC
  Administered 2019-06-25: 10 mL via INTRAVENOUS
  Filled 2019-06-25: qty 10

## 2019-06-25 MED ORDER — HEPARIN SOD (PORK) LOCK FLUSH 100 UNIT/ML IV SOLN
INTRAVENOUS | Status: AC
  Filled 2019-06-25: qty 5

## 2019-06-25 MED ORDER — HEPARIN SOD (PORK) LOCK FLUSH 100 UNIT/ML IV SOLN
500.0000 [IU] | Freq: Once | INTRAVENOUS | Status: AC | PRN
  Administered 2019-06-25: 500 [IU]
  Filled 2019-06-25: qty 5

## 2019-06-25 MED ORDER — SODIUM CHLORIDE 0.9 % IV SOLN
Freq: Once | INTRAVENOUS | Status: AC
  Filled 2019-06-25: qty 250

## 2019-06-25 MED ORDER — SODIUM CHLORIDE 0.9 % IV SOLN
10.0000 mg/kg | Freq: Once | INTRAVENOUS | Status: AC
  Administered 2019-06-25: 740 mg via INTRAVENOUS
  Filled 2019-06-25: qty 10

## 2019-06-25 NOTE — Progress Notes (Signed)
Patient is here for follow up she is doing well no complaints  

## 2019-06-28 NOTE — Progress Notes (Signed)
Hematology/Oncology Consult note [facility]  [phone] [phone]  Patient Care Team: [name], MD as PCP - General (Family Medicine) Telford Nab, RN as Registered Nurse   Name of the patient: Jennifer Hodges  [id number]  [birth year]   Date of visit: 06/28/19  Diagnosis- Adenocarcinoma of the lung stage III T1b N2 M0  Chief complaint/ Reason for visit-on treatment assessment prior to cycle 6 of maintenance durvalumab  Heme/Onc history: patient is a 74 year old female who was seen by pulmonary and ENT for ongoing symptoms of bronchitis and possible laryngitis and received antibiotics for the same.She underwent CT chest in September 2020 which showed multiple bilateral lung nodules with associated mediastinal adenopathy and possible primary left upper lobe lung mass. This was followed by a PET CT scan which showed a left upper lobe lung mass measuring 1.9 x 1.2 cm with an SUV of 10.7. Conglomerate AP window adenopathy measuring 5.8 x 2.1 cm with an SUV of 14. She was also noted to have hypermetabolic left hilar adenopathy and left supraclavicular 0.7 cm lymph node with an SUV of 4.6. Noted to have a 7 mm right lower lobe lung nodule with faint metabolic activity. No evidence of other distant metastatic disease. This was followed by a CT super D chest without contrast. The right lower lobe lung nodules were somewhat larger as compared to September 2020. 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  She continues to take as needed meclizine.  She will also be seen for vestibular physical therapy at [facility].  Other than that she is tolerating immunotherapy well without any significant side effects  ECOG PS- 1 Pain scale- 0   Review of systems- Review of Systems  Constitutional: Positive for malaise/fatigue. Negative for chills, fever and weight loss.  HENT: Negative for congestion, ear discharge and nosebleeds.   Eyes: Negative for blurred vision.  Respiratory: Negative for cough, hemoptysis, sputum production, shortness of breath and wheezing.   Cardiovascular: Negative for chest pain, palpitations, orthopnea and claudication.  Gastrointestinal: Negative for abdominal pain, blood in stool, constipation, diarrhea, heartburn, melena, nausea and vomiting.  Genitourinary: Negative for dysuria, flank pain, frequency, hematuria and urgency.  Musculoskeletal: Negative for back pain, joint pain and myalgias.  Skin: Negative for rash.  Neurological: Negative for dizziness, tingling, focal weakness, seizures, weakness and headaches.       Vertigo  Endo/Heme/Allergies: Does not bruise/bleed easily.  Psychiatric/Behavioral: Negative for depression and suicidal ideas. The patient does not have insomnia.       Allergies  Allergen Reactions  . Aspirin Hives and Other (See Comments)    Difficulty breathing  . Celebrex [Celecoxib] Shortness Of Breath  . Morphine And Related Nausea And Vomiting and Swelling  . Adhesive [Tape] Other (See Comments)    Took top layer of skin off when removed.  . Clarithromycin Nausea And Vomiting  . Codeine Nausea And Vomiting  . Darvon [Propoxyphene Hcl] Nausea And Vomiting  . Demerol [Meperidine] Nausea And Vomiting  . Flonase [Fluticasone Propionate] Other (See Comments)    Fungal infection  . Simvastatin Other (See Comments)    "  caused ulcers in mouth, and fever"  . Talwin [Pentazocine] Nausea And Vomiting     Past Medical  History:  Diagnosis Date  . Anemia   . Asthma    No Inhalers--Dr. [name] will order as needed  . Bronchiectasis (HCC)    mild  . Chronic headaches     followed by [facility] migraines  . COPD (chronic obstructive pulmonary disease) ([place])   . DDD (degenerative disc disease), lumbar   . Diabetes mellitus without complication [facility])    "doctor says I no longer have diabetes"    . Diverticulosis   . Family history of adverse reaction to anesthesia    PONV  . Gall stones    history of  . GERD (gastroesophageal reflux disease)    EGD 8/09- non bleeding erosive gastritis, documentd esophageal ulcerations.   . Hiatal hernia   . History of pneumonia   . Hypercholesterolemia   . IBS (irritable bowel syndrome)   . Malignant neoplasm of upper lobe of left lung ([place]) 12/27/2018  . Murmur   . Osteoarthritis    lumbar disc disease, left hip  . PONV (postoperative nausea and vomiting)    "Only with last hip and I believe it was due to the morphine"   . Sleep apnea    cpap asked to bring mask and tubing  . Vertigo   . Weakness of right side of body      Past Surgical History:  Procedure Laterality Date  . ABDOMINAL HYSTERECTOMY  age 74  . ANTERIOR CERVICAL DECOMP/DISCECTOMY FUSION N/A 02/24/2015   Procedure: CERVICAL FOUR-FIVE, CERVICAL FIVE-SIX, CERVICAL SIX-SEVEN ANTERIOR CERVICAL DECOMPRESSION/DISCECTOMY FUSION ;  Surgeon: [name], MD;  Location: [place] [facility] OR[facility];  Service: Neurosurgery;  Laterality: N/A;  C45 C56 C67 anterior cervical decompression with fusion interbody prosthesis plating and bonegraft  . APPENDECTOMY    . BACK SURGERY     4th lumbar fusion  . BLADDER SURGERY N/A    with vaginal wall repair  . BREAST CYST ASPIRATION Bilateral    neg  . BREAST SURGERY Bilateral    cyst removed and reduction  . CARDIAC CATHETERIZATION  2014  . CARPAL TUNNEL RELEASE Right 02/11/2016   Procedure: CARPAL TUNNEL RELEASE;  Surgeon: [name], MD;  Location: [facility] ORS;   Service: Orthopedics;  Laterality: Right;  . CATARACT EXTRACTION W/ INTRAOCULAR LENS IMPLANT Bilateral 2015  . CHOLECYSTECTOMY    . EUS N/A 05/31/2012   Procedure: UPPER ENDOSCOPIC ULTRASOUND (EUS) LINEAR;  Surgeon: [name], MD;  Location: [facility] END[facility];  Service: Endoscopy;  Laterality: N/A;  . EXCISIONAL HEMORRHOIDECTOMY    . JOINT REPLACEMENT Bilateral   . KNEE ARTHROSCOPY WITH LATERAL MENISECTOMY Right 07/07/2015   Procedure: KNEE ARTHROSCOPY WITH LATERAL MENISECTOMY, PARTIAL SYNOVECTOMY;  Surgeon: [name], MD;  Location: [facility] ORS;  Service: Orthopedics;  Laterality: Right;  . LUMBAR LAMINECTOMY    . PORTA CATH INSERTION N/A 01/07/2019   Procedure: PORTA CATH INSERTION;  Surgeon: [name], MD;  Location: [place] CV LAB;  Service: Cardiovascular;  Laterality: N/A;  . REDUCTION MAMMAPLASTY  1990  . RIGHT OOPHORECTOMY    . TOTAL HIP ARTHROPLASTY Left 05/01/2014   Dr. [name]  . TOTAL HIP ARTHROPLASTY Right 08/05/2014   Procedure: TOTAL HIP ARTHROPLASTY ANTERIOR APPROACH;  Surgeon: [name], MD;  Location: [facility] ORS;  Service: Orthopedics;  Laterality: Right;  . ULNAR NERVE TRANSPOSITION Right 02/11/2016   Procedure: ULNAR NERVE DECOMPRESSION/TRANSPOSITION;  Surgeon: [name], MD;  Location: [facility]  ORS;  Service: Orthopedics;  Laterality: Right;  Marland Kitchen VIDEO BRONCHOSCOPY WITH ENDOBRONCHIAL ULTRASOUND Left 12/19/2018   Procedure: VIDEO BRONCHOSCOPY WITH ENDOBRONCHIAL ULTRASOUND, LEFT, SLEEP APNEA;  Surgeon: [name], MD;  Location: [facility] ORS;  Service: Thoracic;  Laterality: Left;    Social History   Socioeconomic History  . Marital status: Married    Spouse name: Not on file  . Number of children: Not on file  . Years of education: Not on file  . Highest education level: Not on file  Occupational History  . Not on file  Tobacco Use  . Smoking status: Former Smoker    Quit date: 06/13/2007    Years since quitting: 12.0  . Smokeless tobacco: Never Used   Substance and Sexual Activity  . Alcohol use: No    Alcohol/week: 0.0 standard drinks  . Drug use: No  . Sexual activity: Never  Other Topics Concern  . Not on file  Social History Narrative  . Not on file   Social Determinants of Health   Financial Resource Strain:   . Difficulty of Paying Living Expenses:   Food Insecurity:   . Worried About Charity fundraiser in the Last Year:   . Arboriculturist in the Last Year:   Transportation Needs:   . Film/video editor (Medical):   Marland Kitchen Lack of Transportation (Non-Medical):   Physical Activity:   . Days of Exercise per Week:   . Minutes of Exercise per Session:   Stress:   . Feeling of Stress :   Social Connections:   . Frequency of Communication with Friends and Family:   . Frequency of Social Gatherings with Friends and Family:   . Attends Religious Services:   . Active Member of Clubs or Organizations:   . Attends Archivist Meetings:   Marland Kitchen Marital Status:   Intimate Partner Violence:   . Fear of Current or Ex-Partner:   . Emotionally Abused:   Marland Kitchen Physically Abused:   . Sexually Abused:     Family History  Problem Relation Age of Onset  . Heart disease Mother        s/p stent  . Hypertension Mother   . Hypercholesterolemia Mother   . Diabetes Father   . Stomach cancer Other        uncle  . Breast cancer Cousin   . Breast cancer Paternal Aunt      Current Outpatient Medications:  .  albuterol (PROVENTIL) (2.5 MG/3ML) 0.083% nebulizer solution, Take 2.5 mg by nebulization every 6 (six) hours as needed for wheezing., Disp: , Rfl:  .  albuterol (VENTOLIN HFA) 108 (90 Base) MCG/ACT inhaler, Inhale 2 puffs into the lungs every 6 (six) hours as needed for wheezing or shortness of breath., Disp: 18 g, Rfl: 0 .  Bioflavonoid Products (ESTER C PO), Take 1 tablet by mouth 3 (three) times daily., Disp: , Rfl:  .  Calcium-Magnesium-Zinc (CAL-MAG-ZINC PO), Take 1 tablet by mouth daily., Disp: , Rfl:  .  cetirizine  (ZYRTEC) 5 MG tablet, Take 1 tablet (5 mg total) by mouth daily. (Patient taking differently: Take 5 mg by mouth daily as needed (runny nose.). ), Disp: 30 tablet, Rfl: 0 .  CHLORASEPTIC MAX SORE THROAT 1.5-33 % LIQD, Use as directed 1 spray in the mouth or throat as needed (throat irritation.)., Disp: , Rfl:  .  Cholecalciferol (EQL VITAMIN D3) 25 MCG (1000 UT) capsule, Take 4,000 Units by mouth daily., Disp: , Rfl:  .  CHROMIUM PO, Take 1 capsule by mouth daily., Disp: , Rfl:  .  Coenzyme Q10 (CO Q 10) 100 MG CAPS, Take 100 mg by mouth daily. , Disp: , Rfl:  .  Cyanocobalamin (VITAMIN B-12) 2500 MCG SUBL, Place 2,500 mcg under the tongue daily., Disp: , Rfl:  .  cyclobenzaprine (FLEXERIL) 10 MG tablet, Take 10 mg by mouth at bedtime. , Disp: , Rfl:  .  DHEA 25 MG CAPS, Take 25 mg by mouth daily., Disp: , Rfl:  .  Ensure (ENSURE), Take 237 mLs by mouth 2 (two) times daily between meals., Disp: , Rfl:  .  ezetimibe (ZETIA) 10 MG tablet, Take 10 mg by mouth at bedtime., Disp: , Rfl:  .  folic acid (FOLVITE) 791 MCG tablet, Take 400 mcg by mouth 2 (two) times daily. , Disp: , Rfl:  .  Garlic 5056 MG CAPS, Take 1,000 mg by mouth daily., Disp: , Rfl:  .  Histamine Dihydrochloride (AUSTRALIAN DREAM ARTHRITIS EX), Apply 1 application topically 4 (four) times daily as needed (pain.)., Disp: , Rfl:  .  HYDROcodone-acetaminophen (NORCO/VICODIN) 5-325 MG tablet, 1-2 tabs po bid prn (Patient taking differently: Take 1 tablet by mouth at bedtime. ), Disp: 10 tablet, Rfl: 0 .  HYDROcodone-homatropine (HYCODAN) 5-1.5 MG/5ML syrup, Take 5 mLs by mouth 3 (three) times daily as needed for cough., Disp: 120 mL, Rfl: 0 .  ipratropium (ATROVENT) 0.06 % nasal spray, Place 2 sprays into both nostrils 4 (four) times daily as needed for rhinitis., Disp: 15 mL, Rfl: 0 .  meclizine (ANTIVERT) 25 MG tablet, Take 1 tablet (25 mg total) by mouth every 6 (six) hours as needed for dizziness., Disp: 30 tablet, Rfl: 0 .   montelukast (SINGULAIR) 10 MG tablet, Take 10 mg by mouth at bedtime. , Disp: , Rfl:  .  NON FORMULARY, Take 1 capsule by mouth daily., Disp: , Rfl:  .  ondansetron (ZOFRAN-ODT) 4 MG disintegrating tablet, Take 4 mg by mouth every 8 (eight) hours as needed for nausea/vomiting., Disp: , Rfl:  .  Polyethyl Glycol-Propyl Glycol (SYSTANE) 0.4-0.3 % SOLN, Place 1 drop into both eyes 5 (five) times daily as needed (dry/irritated eyes). , Disp: , Rfl:  .  pyridOXINE (VITAMIN B-6) 100 MG tablet, Take 100 mg by mouth daily., Disp: , Rfl:  .  Selenium 200 MCG CAPS, Take 200 mcg by mouth daily., Disp: , Rfl:  .  sucralfate (CARAFATE) 1 g tablet, 1 tablet daily., Disp: , Rfl:  .  umeclidinium-vilanterol (ANORO ELLIPTA) 62.5-25 MCG/INH AEPB, Inhale 1 puff into the lungs as needed. , Disp: , Rfl:  .  vitamin E 400 UNIT capsule, Take 400 Units by mouth daily., Disp: , Rfl:  .  Zoledronic Acid (RECLAST IV), Inject 1 Dose into the vein as directed. ONCE A YEAR, Disp: , Rfl:   Physical exam:  Vitals:   06/25/19 1343  BP: 113/66  Pulse: 77  Resp: 18  Temp: (!) 96.5 F (35.8 C)  TempSrc: Tympanic  SpO2: 99%  Weight: 161 lb (73 kg)   Physical Exam Constitutional:      General: She is not in acute distress. HENT:     Head: Normocephalic and atraumatic.  Eyes:     Pupils: Pupils are equal, round, and reactive to light.  Cardiovascular:     Rate and Rhythm: Normal rate and regular rhythm.     Heart sounds: Normal heart sounds.  Pulmonary:     Effort: Pulmonary effort is normal.  Breath sounds: Normal breath sounds.  Abdominal:     General: Bowel sounds are normal.     Palpations: Abdomen is soft.  Musculoskeletal:     Cervical back: Normal range of motion.  Skin:    General: Skin is warm and dry.  Neurological:     Mental Status: She is alert and oriented to person, place, and time.      CMP Latest Ref Rng & Units 06/25/2019  Glucose 70 - 99 mg/dL 129(H)  BUN 8 - 23 mg/dL 16  Creatinine  0.44 - 1.00 mg/dL 0.46  Sodium 135 - 145 mmol/L 137  Potassium 3.5 - 5.1 mmol/L 3.9  Chloride 98 - 111 mmol/L 100  CO2 22 - 32 mmol/L 26  Calcium 8.9 - 10.3 mg/dL 9.1  Total Protein 6.5 - 8.1 g/dL 7.0  Total Bilirubin 0.3 - 1.2 mg/dL 0.6  Alkaline Phos 38 - 126 U/L 79  AST 15 - 41 U/L 20  ALT 0 - 44 U/L 18   CBC Latest Ref Rng & Units 06/25/2019  WBC 4.0 - 10.5 K/uL 4.2  Hemoglobin 12.0 - 15.0 g/dL 12.5  Hematocrit 36.0 - 46.0 % 36.7  Platelets 150 - 400 K/uL 236      Assessment and plan- Patient is a 74 y.o. female  with stage III adenocarcinoma of the lung.    She is here for on treatment assessment prior to cycle 6 of maintenance durvalumab  Counts okay to proceed with cycle 6 of maintenance durvalumab today.  Overall patient is tolerating treatment well without any significant side effects.  I will see her back in 2 weeks for cycle 7.  Plan to get repeat CT chest abdomen and pelvis with contrast In 1 week's time.  Patient did receive her first dose of Covid vaccine which she has tolerated well without any significant side effects.  Vertigo: She continues to see ENT for this and is on as needed meclizine   Visit Diagnosis 1. Encounter for antineoplastic immunotherapy   2. Malignant neoplasm of upper lobe of left lung (HCC)   3. Vertigo      Dr. Randa Evens, MD, MPH [facility] at [facility] [phone] 06/28/2019 10:38 AM

## 2019-07-02 ENCOUNTER — Ambulatory Visit
Admission: RE | Admit: 2019-07-02 | Discharge: 2019-07-02 | Disposition: A | Discharge status: Home / Self Care | Payer: Medicare Other | Source: Ambulatory Visit | Attending: Oncology | Admitting: Oncology

## 2019-07-02 ENCOUNTER — Other Ambulatory Visit: Payer: Self-pay

## 2019-07-02 DIAGNOSIS — C3412 Malignant neoplasm of upper lobe, left bronchus or lung: Secondary | ICD-10-CM | POA: Insufficient documentation

## 2019-07-02 MED ORDER — IOHEXOL 300 MG/ML  SOLN
100.0000 mL | Freq: Once | INTRAMUSCULAR | Status: AC | PRN
  Administered 2019-07-02: 100 mL via INTRAVENOUS

## 2019-07-02 NOTE — Progress Notes (Signed)

## 2019-07-09 ENCOUNTER — Inpatient Hospital Stay: Payer: Medicare Other

## 2019-07-09 ENCOUNTER — Encounter: Payer: Self-pay | Admitting: Oncology

## 2019-07-09 ENCOUNTER — Other Ambulatory Visit: Payer: Self-pay

## 2019-07-09 ENCOUNTER — Inpatient Hospital Stay (HOSPITAL_BASED_OUTPATIENT_CLINIC_OR_DEPARTMENT_OTHER): Payer: Medicare Other | Admitting: Oncology

## 2019-07-09 VITALS — BP 113/72 | HR 72 | Resp 18

## 2019-07-09 VITALS — BP 119/74 | HR 78 | Temp 96.9°F | Resp 20 | Wt 163.7 lb

## 2019-07-09 DIAGNOSIS — C3412 Malignant neoplasm of upper lobe, left bronchus or lung: Secondary | ICD-10-CM

## 2019-07-09 DIAGNOSIS — Z5112 Encounter for antineoplastic immunotherapy: Secondary | ICD-10-CM

## 2019-07-09 LAB — COMPREHENSIVE METABOLIC PANEL WITH GFR
ALT: 18 U/L (ref 0–44)
AST: 20 U/L (ref 15–41)
Albumin: 4.4 g/dL (ref 3.5–5.0)
Alkaline Phosphatase: 77 U/L (ref 38–126)
Anion gap: 10 (ref 5–15)
BUN: 10 mg/dL (ref 8–23)
CO2: 27 mmol/L (ref 22–32)
Calcium: 9.1 mg/dL (ref 8.9–10.3)
Chloride: 100 mmol/L (ref 98–111)
Creatinine, Ser: 0.47 mg/dL (ref 0.44–1.00)
GFR calc Af Amer: >60 mL/min
GFR calc non Af Amer: >60 mL/min
Glucose, Bld: 97 mg/dL (ref 70–99)
Potassium: 4.1 mmol/L (ref 3.5–5.1)
Sodium: 137 mmol/L (ref 135–145)
Total Bilirubin: 0.6 mg/dL (ref 0.3–1.2)
Total Protein: 7 g/dL (ref 6.5–8.1)

## 2019-07-09 LAB — CBC WITH DIFFERENTIAL/PLATELET
Abs Immature Granulocytes: 0 10*3/uL (ref 0.00–0.07)
Basophils Absolute: 0 10*3/uL (ref 0.0–0.1)
Basophils Relative: 0 %
Eosinophils Absolute: 0.1 10*3/uL (ref 0.0–0.5)
Eosinophils Relative: 2 %
HCT: 37.4 % (ref 36.0–46.0)
Hemoglobin: 12.6 g/dL (ref 12.0–15.0)
Immature Granulocytes: 0 %
Lymphocytes Relative: 18 %
Lymphs Abs: 0.8 10*3/uL (ref 0.7–4.0)
MCH: 30.6 pg (ref 26.0–34.0)
MCHC: 33.7 g/dL (ref 30.0–36.0)
MCV: 90.8 fL (ref 80.0–100.0)
Monocytes Absolute: 0.4 10*3/uL (ref 0.1–1.0)
Monocytes Relative: 9 %
Neutro Abs: 3.1 10*3/uL (ref 1.7–7.7)
Neutrophils Relative %: 71 %
Platelets: 226 10*3/uL (ref 150–400)
RBC: 4.12 MIL/uL (ref 3.87–5.11)
RDW: 12.4 % (ref 11.5–15.5)
WBC: 4.4 10*3/uL (ref 4.0–10.5)
nRBC: 0 % (ref 0.0–0.2)

## 2019-07-09 MED ORDER — SODIUM CHLORIDE 0.9 % IV SOLN
Freq: Once | INTRAVENOUS | Status: AC
  Filled 2019-07-09: qty 250

## 2019-07-09 MED ORDER — HEPARIN SOD (PORK) LOCK FLUSH 100 UNIT/ML IV SOLN
INTRAVENOUS | Status: AC
  Filled 2019-07-09: qty 5

## 2019-07-09 MED ORDER — SODIUM CHLORIDE 0.9% FLUSH
10.0000 mL | Freq: Once | INTRAVENOUS | Status: AC
  Administered 2019-07-09: 10 mL via INTRAVENOUS
  Filled 2019-07-09: qty 10

## 2019-07-09 MED ORDER — SODIUM CHLORIDE 0.9 % IV SOLN
10.0000 mg/kg | Freq: Once | INTRAVENOUS | Status: AC
  Administered 2019-07-09: 10:00:00 740 mg via INTRAVENOUS
  Filled 2019-07-09: qty 10

## 2019-07-09 MED ORDER — HEPARIN SOD (PORK) LOCK FLUSH 100 UNIT/ML IV SOLN
500.0000 [IU] | Freq: Once | INTRAVENOUS | Status: AC | PRN
  Administered 2019-07-09: 500 [IU]
  Filled 2019-07-09: qty 5

## 2019-07-09 NOTE — Progress Notes (Signed)
Patient here today for follow up and treatment consideration regarding lung cancer. Patient has questions regarding recent CT scan. Patient reports dizziness.has improved.

## 2019-07-12 NOTE — Progress Notes (Signed)
Hematology/Oncology Consult note [facility]  Telephone:([phone] [phone]  Patient Care Team: [name], MD as PCP - General (Family Medicine) Telford Nab, RN as Registered Nurse   Name of the patient: Jennifer Hodges  [id number]  August 27, 1945   Date of visit: 07/12/19  Diagnosis- Adenocarcinoma of the lung stage III T1b N2 M0  Chief complaint/ Reason for visit-on treatment assessment prior to cycle 7 of maintenance durvalumab  Heme/Onc history: patient is a 74 year old female who was seen by pulmonary and ENT for ongoing symptoms of bronchitis and possible laryngitis and received antibiotics for the same.She underwent CT chest in September 2020 which showed multiple bilateral lung nodules with associated mediastinal adenopathy and possible primary left upper lobe lung mass. This was followed by a PET CT scan which showed a left upper lobe lung mass measuring 1.9 x 1.2 cm with an SUV of 10.7. Conglomerate AP window adenopathy measuring 5.8 x 2.1 cm with an SUV of 14. She was also noted to have hypermetabolic left hilar adenopathy and left supraclavicular 0.7 cm lymph node with an SUV of 4.6. Noted to have a 7 mm right lower lobe lung nodule with faint metabolic activity. No evidence of other distant metastatic disease. This was followed by a CT super D chest without contrast. The right lower lobe lung nodules were somewhat larger as compared to September 2020. 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  She  still has issues with ongoing vertigo for which she sees ENT.  She continues to remain on meclizine.  Denies any nausea vomiting or diarrhea.  ECOG PS- 1 Pain scale- 0  Review of systems- Review of Systems  Constitutional: Positive for malaise/fatigue. Negative for chills, fever and weight loss.  HENT: Negative for congestion, ear discharge and nosebleeds.   Eyes: Negative for blurred vision.  Respiratory: Negative for cough, hemoptysis, sputum production, shortness of breath and wheezing.   Cardiovascular: Negative for chest pain, palpitations, orthopnea and claudication.  Gastrointestinal: Negative for abdominal pain, blood in stool, constipation, diarrhea, heartburn, melena, nausea and vomiting.  Genitourinary: Negative for dysuria, flank pain, frequency, hematuria and urgency.  Musculoskeletal: Negative for back pain, joint pain and myalgias.  Skin: Negative for rash.  Neurological: Positive for dizziness. Negative for tingling, focal weakness, seizures, weakness and headaches.  Endo/Heme/Allergies: Does not bruise/bleed easily.  Psychiatric/Behavioral: Negative for depression and suicidal ideas. The patient does not have insomnia.        Allergies  Allergen Reactions  . Aspirin Hives and Other (See Comments)    Difficulty breathing  . Celebrex [Celecoxib] Shortness Of Breath  . Morphine And Related Nausea And Vomiting and Swelling  . Adhesive [Tape] Other (See Comments)    Took top layer of skin off when removed.  . Clarithromycin Nausea And Vomiting  . Codeine Nausea And Vomiting  . Darvon [Propoxyphene Hcl] Nausea And Vomiting  . Demerol [Meperidine] Nausea And Vomiting  . Flonase [Fluticasone Propionate] Other (See Comments)    Fungal infection  . Simvastatin Other (See Comments)    "caused ulcers in mouth, and fever"  . Talwin [Pentazocine] Nausea And Vomiting  Past Medical History:  Diagnosis Date  . Anemia   . Asthma    No Inhalers--Dr. [name] will order  as needed  . Bronchiectasis (HCC)    mild  . Chronic headaches     followed by [facility] migraines  . COPD (chronic obstructive pulmonary disease) ([place])   . DDD (degenerative disc disease), lumbar   . Diabetes mellitus without complication [facility])    "doctor says I no longer have diabetes"    . Diverticulosis   . Family history of adverse reaction to anesthesia    PONV  . Gall stones    history of  . GERD (gastroesophageal reflux disease)    EGD 8/09- non bleeding erosive gastritis, documentd esophageal ulcerations.   . Hiatal hernia   . History of pneumonia   . Hypercholesterolemia   . IBS (irritable bowel syndrome)   . Malignant neoplasm of upper lobe of left lung ([place]) 12/27/2018  . Murmur   . Osteoarthritis    lumbar disc disease, left hip  . PONV (postoperative nausea and vomiting)    "Only with last hip and I believe it was due to the morphine"   . Sleep apnea    cpap asked to bring mask and tubing  . Vertigo   . Weakness of right side of body      Past Surgical History:  Procedure Laterality Date  . ABDOMINAL HYSTERECTOMY  age 31  . ANTERIOR CERVICAL DECOMP/DISCECTOMY FUSION N/A 02/24/2015   Procedure: CERVICAL FOUR-FIVE, CERVICAL FIVE-SIX, CERVICAL SIX-SEVEN ANTERIOR CERVICAL DECOMPRESSION/DISCECTOMY FUSION ;  Surgeon: [name], MD;  Location: [place] [facility];  Service: Neurosurgery;  Laterality: N/A;  C45 C56 C67 anterior cervical decompression with fusion interbody prosthesis plating and bonegraft  . APPENDECTOMY    . BACK SURGERY     4th lumbar fusion  . BLADDER SURGERY N/A    with vaginal wall repair  . BREAST CYST ASPIRATION Bilateral    neg  . BREAST SURGERY Bilateral    cyst removed and reduction  . CARDIAC CATHETERIZATION  2014  . CARPAL TUNNEL RELEASE Right 02/11/2016   Procedure: CARPAL TUNNEL RELEASE;  Surgeon: [name], MD;  Location: [facility] ORS;  Service: Orthopedics;  Laterality: Right;  . CATARACT EXTRACTION W/ INTRAOCULAR LENS  IMPLANT Bilateral 2015  . CHOLECYSTECTOMY    . EUS N/A 05/31/2012   Procedure: UPPER ENDOSCOPIC ULTRASOUND (EUS) LINEAR;  Surgeon: [name], MD;  Location: [facility];  Service: Endoscopy;  Laterality: N/A;  . EXCISIONAL HEMORRHOIDECTOMY    . JOINT REPLACEMENT Bilateral   . KNEE ARTHROSCOPY WITH LATERAL MENISECTOMY Right 07/07/2015   Procedure: KNEE ARTHROSCOPY WITH LATERAL MENISECTOMY, PARTIAL SYNOVECTOMY;  Surgeon: [name], MD;  Location: [facility] ORS;  Service: Orthopedics;  Laterality: Right;  . LUMBAR LAMINECTOMY    . PORTA CATH INSERTION N/A 01/07/2019   Procedure: PORTA CATH INSERTION;  Surgeon: [name], MD;  Location: [place] CV [facility];  Service: Cardiovascular;  Laterality: N/A;  . REDUCTION MAMMAPLASTY  1990  . RIGHT OOPHORECTOMY    . TOTAL HIP ARTHROPLASTY Left 05/01/2014   Dr. [name]  . TOTAL HIP ARTHROPLASTY Right 08/05/2014   Procedure: TOTAL HIP ARTHROPLASTY ANTERIOR APPROACH;  Surgeon: [name], MD;  Location: [facility] ORS;  Service: Orthopedics;  Laterality: Right;  . ULNAR NERVE TRANSPOSITION Right 02/11/2016   Procedure: ULNAR NERVE DECOMPRESSION/TRANSPOSITION;  Surgeon: [name], MD;  Location: [facility] ORS;  Service: Orthopedics;  Laterality: Right;  Marland Kitchen VIDEO BRONCHOSCOPY WITH ENDOBRONCHIAL ULTRASOUND Left 12/19/2018  Procedure: VIDEO BRONCHOSCOPY WITH ENDOBRONCHIAL ULTRASOUND, LEFT, SLEEP APNEA;  Surgeon: [name], MD;  Location: [facility] ORS;  Service: Thoracic;  Laterality: Left;    Social History   Socioeconomic History  . Marital status: Married    Spouse name: Not on file  . Number of children: Not on file  . Years of education: Not on file  . Highest education level: Not on file  Occupational History  . Not on file  Tobacco Use  . Smoking status: Former Smoker    Quit date: 06/13/2007    Years since quitting: 12.0  . Smokeless tobacco: Never Used  Substance and Sexual Activity  . Alcohol use: No    Alcohol/week: 0.0 standard  drinks  . Drug use: No  . Sexual activity: Never  Other Topics Concern  . Not on file  Social History Narrative  . Not on file   Social Determinants of Health   Financial Resource Strain:   . Difficulty of Paying Living Expenses:   Food Insecurity:   . Worried About Charity fundraiser in the Last Year:   . Arboriculturist in the Last Year:   Transportation Needs:   . Film/video editor (Medical):   Marland Kitchen Lack of Transportation (Non-Medical):   Physical Activity:   . Days of Exercise per Week:   . Minutes of Exercise per Session:   Stress:   . Feeling of Stress :   Social Connections:   . Frequency of Communication with Friends and Family:   . Frequency of Social Gatherings with Friends and Family:   . Attends Religious Services:   . Active Member of Clubs or Organizations:   . Attends Archivist Meetings:   Marland Kitchen Marital Status:   Intimate Partner Violence:   . Fear of Current or Ex-Partner:   . Emotionally Abused:   Marland Kitchen Physically Abused:   . Sexually Abused:     Family History  Problem Relation Age of Onset  . Heart disease Mother        s/p stent  . Hypertension Mother   . Hypercholesterolemia Mother   . Diabetes Father   . Stomach cancer Other        uncle  . Breast cancer Cousin   . Breast cancer Paternal Aunt      Current Outpatient Medications:  .  albuterol (PROVENTIL) (2.5 MG/3ML) 0.083% nebulizer solution, Take 2.5 mg by nebulization every 6 (six) hours as needed for wheezing., Disp: , Rfl:  .  albuterol (VENTOLIN HFA) 108 (90 Base) MCG/ACT inhaler, Inhale 2 puffs into the lungs every 6 (six) hours as needed for wheezing or shortness of breath., Disp: 18 g, Rfl: 0 .  Bioflavonoid Products (ESTER C PO), Take 1 tablet by mouth 3 (three) times daily., Disp: , Rfl:  .  Calcium-Magnesium-Zinc (CAL-MAG-ZINC PO), Take 1 tablet by mouth daily., Disp: , Rfl:  .  cetirizine (ZYRTEC) 5 MG tablet, Take 1 tablet (5 mg total) by mouth daily. (Patient taking  differently: Take 5 mg by mouth daily as needed (runny nose.). ), Disp: 30 tablet, Rfl: 0 .  CHLORASEPTIC MAX SORE THROAT 1.5-33 % LIQD, Use as directed 1 spray in the mouth or throat as needed (throat irritation.)., Disp: , Rfl:  .  Cholecalciferol (EQL VITAMIN D3) 25 MCG (1000 UT) capsule, Take 4,000 Units by mouth daily., Disp: , Rfl:  .  CHROMIUM PO, Take 1 capsule by mouth daily., Disp: , Rfl:  .  Coenzyme Q10 (CO  Q 10) 100 MG CAPS, Take 100 mg by mouth daily. , Disp: , Rfl:  .  Cyanocobalamin (VITAMIN B-12) 2500 MCG SUBL, Place 2,500 mcg under the tongue daily., Disp: , Rfl:  .  cyclobenzaprine (FLEXERIL) 10 MG tablet, Take 10 mg by mouth at bedtime. , Disp: , Rfl:  .  DHEA 25 MG CAPS, Take 25 mg by mouth daily., Disp: , Rfl:  .  Ensure (ENSURE), Take 237 mLs by mouth 2 (two) times daily between meals., Disp: , Rfl:  .  ezetimibe (ZETIA) 10 MG tablet, Take 10 mg by mouth at bedtime., Disp: , Rfl:  .  folic acid (FOLVITE) 761 MCG tablet, Take 400 mcg by mouth 2 (two) times daily. , Disp: , Rfl:  .  Garlic 9509 MG CAPS, Take 1,000 mg by mouth daily., Disp: , Rfl:  .  Histamine Dihydrochloride (AUSTRALIAN DREAM ARTHRITIS EX), Apply 1 application topically 4 (four) times daily as needed (pain.)., Disp: , Rfl:  .  HYDROcodone-acetaminophen (NORCO/VICODIN) 5-325 MG tablet, 1-2 tabs po bid prn (Patient taking differently: Take 1 tablet by mouth at bedtime. ), Disp: 10 tablet, Rfl: 0 .  HYDROcodone-homatropine (HYCODAN) 5-1.5 MG/5ML syrup, Take 5 mLs by mouth 3 (three) times daily as needed for cough., Disp: 120 mL, Rfl: 0 .  ipratropium (ATROVENT) 0.06 % nasal spray, Place 2 sprays into both nostrils 4 (four) times daily as needed for rhinitis., Disp: 15 mL, Rfl: 0 .  meclizine (ANTIVERT) 25 MG tablet, Take 1 tablet (25 mg total) by mouth every 6 (six) hours as needed for dizziness., Disp: 30 tablet, Rfl: 0 .  montelukast (SINGULAIR) 10 MG tablet, Take 10 mg by mouth at bedtime. , Disp: , Rfl:  .   NON FORMULARY, Take 1 capsule by mouth daily., Disp: , Rfl:  .  ondansetron (ZOFRAN-ODT) 4 MG disintegrating tablet, Take 4 mg by mouth every 8 (eight) hours as needed for nausea/vomiting., Disp: , Rfl:  .  Polyethyl Glycol-Propyl Glycol (SYSTANE) 0.4-0.3 % SOLN, Place 1 drop into both eyes 5 (five) times daily as needed (dry/irritated eyes). , Disp: , Rfl:  .  pyridOXINE (VITAMIN B-6) 100 MG tablet, Take 100 mg by mouth daily., Disp: , Rfl:  .  Selenium 200 MCG CAPS, Take 200 mcg by mouth daily., Disp: , Rfl:  .  sucralfate (CARAFATE) 1 g tablet, 1 tablet daily., Disp: , Rfl:  .  umeclidinium-vilanterol (ANORO ELLIPTA) 62.5-25 MCG/INH AEPB, Inhale 1 puff into the lungs as needed. , Disp: , Rfl:  .  vitamin E 400 UNIT capsule, Take 400 Units by mouth daily., Disp: , Rfl:  .  Zoledronic Acid (RECLAST IV), Inject 1 Dose into the vein as directed. ONCE A YEAR, Disp: , Rfl:   Physical exam:  Vitals:   07/09/19 0903 07/09/19 0909  BP:  119/74  Pulse: 78   Resp: 20   Temp: (!) 96.9 F (36.1 C)   TempSrc: Tympanic   SpO2: 99%   Weight: 163 lb 11.2 oz (74.3 kg)    Physical Exam Constitutional:      General: She is not in acute distress. HENT:     Head: Normocephalic and atraumatic.  Eyes:     Pupils: Pupils are equal, round, and reactive to light.  Cardiovascular:     Rate and Rhythm: Normal rate and regular rhythm.     Heart sounds: Normal heart sounds.  Pulmonary:     Effort: Pulmonary effort is normal.     Breath sounds: Normal  breath sounds.  Abdominal:     General: Bowel sounds are normal.     Palpations: Abdomen is soft.  Musculoskeletal:     Cervical back: Normal range of motion.  Skin:    General: Skin is warm and dry.  Neurological:     Mental Status: She is alert and oriented to person, place, and time.      CMP Latest Ref Rng & Units 07/09/2019  Glucose 70 - 99 mg/dL 97  BUN 8 - 23 mg/dL 10  Creatinine 0.44 - 1.00 mg/dL 0.47  Sodium 135 - 145 mmol/L 137    Potassium 3.5 - 5.1 mmol/L 4.1  Chloride 98 - 111 mmol/L 100  CO2 22 - 32 mmol/L 27  Calcium 8.9 - 10.3 mg/dL 9.1  Total Protein 6.5 - 8.1 g/dL 7.0  Total Bilirubin 0.3 - 1.2 mg/dL 0.6  Alkaline Phos 38 - 126 U/L 77  AST 15 - 41 U/L 20  ALT 0 - 44 U/L 18   CBC Latest Ref Rng & Units 07/09/2019  WBC 4.0 - 10.5 K/uL 4.4  Hemoglobin 12.0 - 15.0 g/dL 12.6  Hematocrit 36.0 - 46.0 % 37.4  Platelets 150 - 400 K/uL 226    No images are attached to the encounter.  CT Chest W Contrast  Result Date: 07/02/2019 CLINICAL DATA:  Lung cancer.  Restaging. EXAM: CT CHEST, ABDOMEN, AND PELVIS WITH CONTRAST TECHNIQUE: Multidetector CT imaging of the chest, abdomen and pelvis was performed following the standard protocol during bolus administration of intravenous contrast. CONTRAST:  145m OMNIPAQUE IOHEXOL 300 MG/ML  SOLN COMPARISON:  03/22/2019 FINDINGS: CT CHEST FINDINGS Cardiovascular: The heart size is normal. No substantial pericardial effusion. Coronary artery calcification is evident. Atherosclerotic calcification is noted in the wall of the thoracic aorta. Right Port-A-Cath tip is positioned at the SVC/RA junction. Mediastinum/Nodes: 11 mm short axis low right paratracheal node is stable. There is no hilar lymphadenopathy. The esophagus has normal imaging features. There is no axillary lymphadenopathy. Lungs/Pleura: Centrilobular emphsyema noted. 5 mm anterior right upper lobe pulmonary nodule identified on the previous study has resolved in the interval. Anterior left upper lobe pulmonary nodule measured previously at 1.6 cm now measures 1.1 cm (48/4). 5 mm right lower lobe nodule identified on the previous study has nearly resolved measuring 1-2 mm today. There is a new ill-defined irregular opacity in the posterior left lower lobe measuring 2.6 x 2.0 cm on image 81/4. Similar sub solid nodular opacity in the medial left upper lobe visible on 70/4, measuring 1.6 x 1.4 cm. Musculoskeletal: No worrisome  lytic or sclerotic osseous abnormality. CT ABDOMEN PELVIS FINDINGS Hepatobiliary: No suspicious focal abnormality within the liver parenchyma. Intra and extrahepatic biliary duct dilatation again noted, similar to prior. Common bile duct in the head of pancreas is 7 mm in diameter, stable. Pancreas: Pancreas divisum anatomy evident.  Otherwise unremarkable. Spleen: No splenomegaly. No focal mass lesion. Adrenals/Urinary Tract: No adrenal nodule or mass. Right kidney unremarkable. Tiny hypodensities in the left kidney are too small to characterize but likely benign. No evidence for hydroureter. Distal ureters and bladder obscured by beam hardening artifact from bilateral hip replacement. Stomach/Bowel: Stomach is unremarkable. No gastric wall thickening. No evidence of outlet obstruction. Duodenum is normally positioned as is the ligament of Treitz. No small bowel wall thickening. No small bowel dilatation. The terminal ileum is normal. The appendix is not visualized, but there is no edema or inflammation in the region of the cecum. No gross colonic mass. No  colonic wall thickening. Distal sigmoid colon and rectum obscured by artifact. Vascular/Lymphatic: There is abdominal aortic atherosclerosis without aneurysm. There is no gastrohepatic or hepatoduodenal ligament lymphadenopathy. No retroperitoneal or mesenteric lymphadenopathy. Pelvic sidewalls obscured by artifact. Reproductive: Obscured by artifact. Other: No intraperitoneal free fluid. Musculoskeletal: Status post bilateral hip replacement. No worrisome lytic or sclerotic osseous abnormality. IMPRESSION: 1. Continued further decrease in size of previously identified bilateral index pulmonary nodules. 2. New areas of ill-defined/irregular consolidative opacity in the left lung. Infectious/inflammatory etiology suspected. Close follow-up recommended. 3. No evidence for metastatic disease in the abdomen/pelvis. 4.  Emphysema (ICD10-J43.9) and Aortic  Atherosclerosis (ICD10-170.0) Electronically Signed   By: Misty Stanley M.D.   On: 07/02/2019 16:42   CT Abdomen Pelvis W Contrast  Result Date: 07/02/2019 CLINICAL DATA:  Lung cancer.  Restaging. EXAM: CT CHEST, ABDOMEN, AND PELVIS WITH CONTRAST TECHNIQUE: Multidetector CT imaging of the chest, abdomen and pelvis was performed following the standard protocol during bolus administration of intravenous contrast. CONTRAST:  118m OMNIPAQUE IOHEXOL 300 MG/ML  SOLN COMPARISON:  03/22/2019 FINDINGS: CT CHEST FINDINGS Cardiovascular: The heart size is normal. No substantial pericardial effusion. Coronary artery calcification is evident. Atherosclerotic calcification is noted in the wall of the thoracic aorta. Right Port-A-Cath tip is positioned at the SVC/RA junction. Mediastinum/Nodes: 11 mm short axis low right paratracheal node is stable. There is no hilar lymphadenopathy. The esophagus has normal imaging features. There is no axillary lymphadenopathy. Lungs/Pleura: Centrilobular emphsyema noted. 5 mm anterior right upper lobe pulmonary nodule identified on the previous study has resolved in the interval. Anterior left upper lobe pulmonary nodule measured previously at 1.6 cm now measures 1.1 cm (48/4). 5 mm right lower lobe nodule identified on the previous study has nearly resolved measuring 1-2 mm today. There is a new ill-defined irregular opacity in the posterior left lower lobe measuring 2.6 x 2.0 cm on image 81/4. Similar sub solid nodular opacity in the medial left upper lobe visible on 70/4, measuring 1.6 x 1.4 cm. Musculoskeletal: No worrisome lytic or sclerotic osseous abnormality. CT ABDOMEN PELVIS FINDINGS Hepatobiliary: No suspicious focal abnormality within the liver parenchyma. Intra and extrahepatic biliary duct dilatation again noted, similar to prior. Common bile duct in the head of pancreas is 7 mm in diameter, stable. Pancreas: Pancreas divisum anatomy evident.  Otherwise unremarkable. Spleen:  No splenomegaly. No focal mass lesion. Adrenals/Urinary Tract: No adrenal nodule or mass. Right kidney unremarkable. Tiny hypodensities in the left kidney are too small to characterize but likely benign. No evidence for hydroureter. Distal ureters and bladder obscured by beam hardening artifact from bilateral hip replacement. Stomach/Bowel: Stomach is unremarkable. No gastric wall thickening. No evidence of outlet obstruction. Duodenum is normally positioned as is the ligament of Treitz. No small bowel wall thickening. No small bowel dilatation. The terminal ileum is normal. The appendix is not visualized, but there is no edema or inflammation in the region of the cecum. No gross colonic mass. No colonic wall thickening. Distal sigmoid colon and rectum obscured by artifact. Vascular/Lymphatic: There is abdominal aortic atherosclerosis without aneurysm. There is no gastrohepatic or hepatoduodenal ligament lymphadenopathy. No retroperitoneal or mesenteric lymphadenopathy. Pelvic sidewalls obscured by artifact. Reproductive: Obscured by artifact. Other: No intraperitoneal free fluid. Musculoskeletal: Status post bilateral hip replacement. No worrisome lytic or sclerotic osseous abnormality. IMPRESSION: 1. Continued further decrease in size of previously identified bilateral index pulmonary nodules. 2. New areas of ill-defined/irregular consolidative opacity in the left lung. Infectious/inflammatory etiology suspected. Close follow-up recommended. 3. No evidence  for metastatic disease in the abdomen/pelvis. 4.  Emphysema (ICD10-J43.9) and Aortic Atherosclerosis (ICD10-170.0) Electronically Signed   By: Misty Stanley M.D.   On: 07/02/2019 16:42     Assessment and plan- Patient is a 74 y.o. female with stage III adenocarcinoma of the lung.  She is here for on treatment assessment prior to cycle 7 of maintenance durvalumab  Counts okay to proceed with cycle 7 of maintenance durvalumab today.I have reviewed CT chest  abdomen pelvis images independently and discussed findings with the patient.  Overall patient has stable disease at this time with no concerning findings of recurrence or metastases.  Her primary left upper  lobe lung nodule which was previously 1.6 cm is now 1.1 cm.  She previously had bulky mediastinal and hilar adenopathy which has essentially resolved and there is an isolated 11 mm right paratracheal node which is stable.  Right lower lobe nodule previously 5 mm is nearly resolved at this time.  She does have an ill-defined about city in the left lower lobe measuring about 2.6 x 2.0 cm and it is unclear if this is infectious versus malignant etiology.  Patient does not have any signs and symptoms of pneumonia and I will hold off on prescribing antibiotics at this time and keep an eye on these areas with a close follow-up CT in about 2 to 2-1/2 months.  Patient will continue durvalumab at this time and I will see her back in 2 weeks for cycle 8   Visit Diagnosis 1. Malignant neoplasm of upper lobe of left lung ([place])   2. Encounter for antineoplastic immunotherapy      Dr. Randa Evens, MD, MPH [facility] [phone] 07/12/2019 7:57 AM

## 2019-07-16 NOTE — Progress Notes (Signed)
Pharmacist Chemotherapy Monitoring - Follow Up Assessment    I verify that I have reviewed each item in the below checklist:  . Regimen for the patient is scheduled for the appropriate day and plan matches scheduled date. Marland Kitchen Appropriate non-routine labs are ordered dependent on drug ordered. . If applicable, additional medications reviewed and ordered per protocol based on lifetime cumulative doses and/or treatment regimen.   Plan for follow-up and/or issues identified: No . I-vent associated with next due treatment: No . MD and/or nursing notified: No  Jennifer Hodges 07/16/2019 8:35 AM

## 2019-07-19 ENCOUNTER — Encounter: Payer: Self-pay | Admitting: Physical Therapy

## 2019-07-19 ENCOUNTER — Ambulatory Visit: Payer: Medicare Other | Attending: Otolaryngology | Admitting: Physical Therapy

## 2019-07-19 ENCOUNTER — Other Ambulatory Visit: Payer: Self-pay

## 2019-07-19 DIAGNOSIS — R42 Dizziness and giddiness: Secondary | ICD-10-CM | POA: Insufficient documentation

## 2019-07-19 DIAGNOSIS — H8111 Benign paroxysmal vertigo, right ear: Secondary | ICD-10-CM | POA: Insufficient documentation

## 2019-07-19 DIAGNOSIS — R2681 Unsteadiness on feet: Secondary | ICD-10-CM | POA: Insufficient documentation

## 2019-07-19 NOTE — Therapy (Signed)
[facility] [address] [place], [place], [address] Phone: [phone]   Fax:  [phone]  Physical Therapy Evaluation  Patient Details  Name: Jennifer Hodges MRN: [id number] Date of Birth: [birth year] Referring Provider (PT): Dr. Pryor Ochoa   Encounter Date: 07/19/2019  PT End of Session - 07/19/19 0955    Visit Number  1    Number of Visits  9    Date for PT Re-Evaluation  09/13/19    Authorization Type  Initial Eval 07/19/19 FOTO    PT Start Time  0951    PT Stop Time  1110    PT Time Calculation (min)  79 min    Activity Tolerance  Other (comment)   limited secondary to nausea and vomiting      Past Medical History:  Diagnosis Date  . Anemia   . Asthma    No Inhalers--Dr. [name] will order as needed  . Bronchiectasis (HCC)    mild  . Chronic headaches     followed by [facility] migraines  . COPD (chronic obstructive pulmonary disease) ([place])   . DDD (degenerative disc disease), lumbar   . Diabetes mellitus without complication [facility])    "doctor says I no longer have diabetes"    . Diverticulosis   . Family history of adverse reaction to anesthesia    PONV  . Gall stones    history of  . GERD (gastroesophageal reflux disease)    EGD 8/09- non bleeding erosive gastritis, documentd esophageal ulcerations.   . Hiatal hernia   . History of pneumonia   . Hypercholesterolemia   . IBS (irritable bowel syndrome)   . Malignant neoplasm of upper lobe of left lung ([place]) 12/27/2018  . Murmur   . Osteoarthritis    lumbar disc disease, left hip  . PONV (postoperative nausea and vomiting)    "Only with last hip and I believe it was due to the morphine"   . Sleep apnea    cpap asked to bring mask and tubing  . Vertigo   . Weakness of right side of body     Past Surgical History:  Procedure Laterality Date  . ABDOMINAL HYSTERECTOMY  age 28  . ANTERIOR CERVICAL DECOMP/DISCECTOMY FUSION N/A 02/24/2015   Procedure: CERVICAL  FOUR-FIVE, CERVICAL FIVE-SIX, CERVICAL SIX-SEVEN ANTERIOR CERVICAL DECOMPRESSION/DISCECTOMY FUSION ;  Surgeon: [name], MD;  Location: [place] [facility]UR[facility] ORS;  Service: Neurosurgery;  Laterality: N/A;  C45 C56 C67 anterior cervical decompression with fusion interbody prosthesis plating and bonegraft  . APPENDECTOMY    . BACK SURGERY     4th lumbar fusion  . BLADDER SURGERY N/A    with vaginal wall repair  . BREAST CYST ASPIRATION Bilateral    neg  . BREAST SURGERY Bilateral    cyst removed and reduction  . CARDIAC CATHETERIZATION  2014  . CARPAL TUNNEL RELEASE Right 02/11/2016   Procedure: CARPAL TUNNEL RELEASE;  Surgeon: [name], MD;  Location: [facility] ORS;  Service: Orthopedics;  Laterality: Right;  . CATARACT EXTRACTION W/ INTRAOCULAR LENS IMPLANT Bilateral 2015  . CHOLECYSTECTOMY    . EUS N/A 05/31/2012   Procedure: UPPER ENDOSCOPIC ULTRASOUND (EUS) LINEAR;  Surgeon: [name], MD;  Location: [facility];  Service: Endoscopy;  Laterality: N/A;  . EXCISIONAL HEMORRHOIDECTOMY    . JOINT REPLACEMENT Bilateral   . KNEE ARTHROSCOPY WITH LATERAL MENISECTOMY Right 07/07/2015   Procedure: KNEE ARTHROSCOPY WITH LATERAL MENISECTOMY, PARTIAL SYNOVECTOMY;  Surgeon: [name], MD;  Location:  [facility] ORS;  Service: Orthopedics;  Laterality: Right;  . LUMBAR LAMINECTOMY    . PORTA CATH INSERTION N/A 01/07/2019   Procedure: PORTA CATH INSERTION;  Surgeon: [name], MD;  Location: [facility] CV LAB;  Service: Cardiovascular;  Laterality: N/A;  . REDUCTION MAMMAPLASTY  1990  . RIGHT OOPHORECTOMY    . TOTAL HIP ARTHROPLASTY Left 05/01/2014   Dr. [name]  . TOTAL HIP ARTHROPLASTY Right 08/05/2014   Procedure: TOTAL HIP ARTHROPLASTY ANTERIOR APPROACH;  Surgeon: [name], MD;  Location: [facility] ORS;  Service: Orthopedics;  Laterality: Right;  . ULNAR NERVE TRANSPOSITION Right 02/11/2016   Procedure: ULNAR NERVE DECOMPRESSION/TRANSPOSITION;  Surgeon: [name], MD;  Location: [facility]  ORS;  Service: Orthopedics;  Laterality: Right;  Marland Kitchen VIDEO BRONCHOSCOPY WITH ENDOBRONCHIAL ULTRASOUND Left 12/19/2018   Procedure: VIDEO BRONCHOSCOPY WITH ENDOBRONCHIAL ULTRASOUND, LEFT, SLEEP APNEA;  Surgeon: [name], MD;  Location: [facility] ORS;  Service: Thoracic;  Laterality: Left;    There were no vitals filed for this visit.    [facility] PT Assessment - 07/19/19 1315      Assessment   Medical Diagnosis  dizziness and giddiness    Referring Provider (PT)  Dr. Pryor Ochoa    Prior Therapy  patient reports treatment for BPPV but states she was not able to complete      Precautions   Precautions  Fall      Restrictions   Weight Bearing Restrictions  No      Balance Screen   Has the patient fallen in the past 6 months  No    Has the patient had a decrease in activity level because of a fear of falling?   Yes    Is the patient reluctant to leave their home because of a fear of falling?   No      Home Film/video editor residence    Living Arrangements  Spouse/significant other    Available Help at Discharge  Family    Type of [place] to enter    Entrance Stairs-Number of Steps  [place] - 2 wheels;[place] Knudsen - single point      Prior Function   Level of Independence  Independent with household mobility with device;Independent with community mobility with device      Cognition   Overall Cognitive Status  Within Functional Limits for tasks assessed        VESTIBULAR AND BALANCE EVALUATION  HISTORY:  Subjective history of current problem: History obtained from patient as well as medical record. Patient reports that she has had dizzy spells in the past, but this was more than her usual symptoms. Patient reports she began to have dizziness again last year and then patient began to have symptoms of vertigo, nausea and vomiting in February 2021 when she woke up in the middle of the night with a bad headache and she tried  to lift her head off the pillow.  Patient states she was nauseous and could not sleep on her left side at all. Patient reports that she was been seen by Dr. Pryor Ochoa and Dr. 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 Patient received Epley maneuver at ENT office for left BPPV in March. Patient states the treatments helped but it is still there. Patient had VNG study on 06/19/2019  that revealed abnormal smooth pursuits, saccades and all OPK's. 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 Patient reports that Dr. Pryor Ochoa felt that the chemotherapy drugs might have caused the inner ear damage and balance difficulties. Patient describes her dizziness as vertigo, unsteadiness and lightheadedness. Patient reports that she always has a certain amount of dizziness now and that it is worse at times. Patient reports that bending over, looking up, rolling over especially onto her left side, quick movements all bring on her symptoms and that Meclizine and staying still help to decrease her symptoms some. Patient reports that if she looks down she will fall. Patient reports that she cannot sleep on her left side due to dizziness. Patient reports that she veers usually to the left when she walks. Patient reports that she has not been driving due to her dizziness symptoms. Patient sees Dr. Manuella Ghazi, neurologist. Patient has history of migraines, chronic tension headaches and left sided occipital neuralgia. Patient has family history of migraines and dizziness. Patient reports she always has a dull headache and different things can make it worse such as noise and bright light. She takes an injection once a month for migrianes and patient states that helps a lot. Patient reports the last full blown migraine that she has had was when she had the brain MRI on 05/08/2019  from the noise of the machine. Patient reports she gets a repeat MRI every 3-4 months. 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 Patient states that she received chemotherapy last year as well and she has 2 more chemotherapy treatments but they are doing the immunotherapy to try to build up her immune system first. Patient finished her last radiation treatment in December and is now getting infusion therapy. Patient reports that the cancer pressed on nerves in her neck that affect her voice and that she had speech therapy, which patient states helped her a lot. Patient states that Dr. Kathyrn Sheriff plans on doing surgery for her voice to help improve her voice.   Progression of symptoms: better History of similar episodes: pt states she has had dizziness episodes in the past but this last time has been much worse.  Patient denies falls in past six months. Patient has had shots in both knees can get every 4 months and operated on left knee for torn meniscus.   Auditory complaints (tinnitus, pain, drainage): tinnnitus-buzzing and ringing in left ear; denies drainage had ear wax removal  Vision (last eye exam, diplopia, recent changes): patient wears glasses   EXAMINATION  SOMATOSENSORY:  Any N & T in extremities or weakness: sometimes in both arms and legs  COORDINATION: Deferred testing this date  MUSCULOSKELETAL SCREEN: Cervical Spine ROM: AROM cervical spine WFL  Gait: Patient arrives ambulating without AD. Patient ambulates with slow cadence with fair scanning of visual environment.   Balance: Deferred secondary to positive Dix-Hallpike test; patient indicates that she has difficulty with head turns, quick turns and uneven surfaces.   POSTURAL CONTROL TESTS:  Clinical Test of Sensory Interaction for Balance (CTSIB): deferred  OCULOMOTOR / VESTIBULAR TESTING: Oculomotor Exam- Room Light  Normal Abnormal Comments  Ocular Alignment N    Ocular ROM N    Spontaneous Nystagmus N     Gaze evoked Nystagmus  Abn Mild at end range which could be age appropriate  Smooth Pursuit  Abn Saccadic movements  Saccades  Abn Slow hypometric saccades  VOR   Deferred secondary to positive  DH  VOR Cancellation N    Left Head Impulse   Deferred secondary to positive DH  Right Head Impulse   Deferred secondary to positive DH    BPPV TESTS:  Symptoms Duration Intensity Nystagmus  Left Dix-Hallpike None  N/A None observed  Right Dix-Hallpike Vertigo, Nausea About 30 seconds 5/10 Upbeating nystagmus, difficult to observe but appeared to be right beating as patient had difficulty keeping eyes open     FUNCTIONAL OUTCOME MEASURES:  Results Comments  DHI 92/100 Severe perception of handicap; in need of intervention  ABC Scale 13.3% High falls risk; in need of intervention  DGI deferred   FOTO 45/100 Given the patient's risk adjustment variables, like-patients nationally had a FS score of 53/100 at intake   Canalith Repositioning Maneuver: On mat table, performed right Dix-Hallpike testing and it was positive with right upbeating nystagmus of short duration without latency. It was difficult to observe if there was a right beating torsional component or not secondary to patient closing her eyes. Attempted to performed right canalith repositioning maneuver (CRT). However, patient began to have nausea and needed to sit up. Patient had nausea and vomiting and was unable to tolerate Epley maneuver this date. Will try again next session.    PT Education - 07/19/19 1116    Education Details  discussed BPPV, plan of care and goals; provided [email or web address] BPPV handout    Person(s) Educated  Patient    Methods  Explanation;Handout    Comprehension  Verbalized understanding       PT Short Term Goals - 07/19/19 1332      PT SHORT TERM GOAL #1   Title  Patient will be independent in home exercise program to improve strength/mobility for better functional independence with ADLs and for  self-management.    Time  4    Period  Weeks    Status  New    Target Date  08/16/19        PT Long Term Goals - 07/19/19 1327      PT LONG TERM GOAL #1   Title  Patient will reduce falls risk as indicated by Activities Specific Balance Confidence Scale (ABC) >67%.    Baseline  scored 13.3% on 07/19/2019    Time  8    Period  Weeks    Status  New    Target Date  09/13/19      PT LONG TERM GOAL #2   Title  Patient will reduce perceived disability to moderate levels as indicated by <70 on Dizziness Handicap Inventory [facility]).    Baseline  scored 92/100 on 07/19/19    Time  8    Period  Weeks    Status  New    Target Date  09/13/19      PT LONG TERM GOAL #3   Title  Patient will have improved functional status as evidenced by an increase of 10 points or more on the FOTO score.    Baseline  scored 45/100 on 07/19/19    Time  8    Period  Weeks    Status  New    Target Date  09/13/19      PT LONG TERM GOAL #4   Title  Patient will have improved functional status as evidenced by an increase of 10 points or more on the FOTO score.    Target Date  09/13/19             Plan - 07/19/19 1117  Clinical Impression Statement  Patient presents with concerns of difficulties with balance, walking, dizziness and vertigo. Patient with potential indicators of peripheral and central signs with vestibular testing. Patient with positive right Dix-Hallpike test but was unable to tolerate Epley maneuver this date due to vertigo, nausea and vomiting. Patient is at high risk for falls as inidcated by ABC scale score of 13.3%. Patient scored severe perception of handicap on [place] rated 92/100 and 45/100 on FOTO. Patient would benefit from PT services to address functional deficits, subjective symptoms and goals as set on plan of care.    Personal Factors and Comorbidities  Past/Current Experience;Comorbidity 3+    Comorbidities  intractable migraine, left sided occipital neuralgia, COPD, sleep apnea,  lung CA, chronic tension headaches    Examination-Activity Limitations  Bed Mobility;Bend;Reach Overhead;Locomotion Level;Stairs    Examination-Participation Restrictions  Cleaning;Driving;Yard Work    Merchant navy officer  Evolving/Moderate complexity    Rehab Potential  Fair    PT Frequency  1x / week    PT Duration  8 weeks    PT Treatment/Interventions  Canalith Repostioning;Gait training;Stair training;Therapeutic activities;Therapeutic exercise;Balance training;Neuromuscular re-education;Vestibular;Patient/family education    Consulted and Agree with Plan of Care  Patient       Patient will benefit from skilled therapeutic intervention in order to improve the following deficits and impairments:  Decreased balance, Difficulty walking, Dizziness  Visit Diagnosis: Dizziness and giddiness  BPPV (benign paroxysmal positional vertigo), right  Unsteadiness on feet     Problem List Patient Active Problem List   Diagnosis Date Noted  . Benign breast lumps 01/11/2019  . Gout 01/11/2019  . Hives 01/11/2019  . Lung disease 01/11/2019  . Migraine headache 01/11/2019  . Goals of care, counseling/discussion 12/27/2018  . 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 Night sweats 08/07/2016  . 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 Osteopenia of multiple sites 04/27/2016  . Left carpal tunnel syndrome 02/02/2016  . Carotid stenosis, asymptomatic, bilateral 12/29/2015  . Prediabetes 09/28/2015  . Primary osteoarthritis involving multiple joints 09/28/2015  . Knee pain 05/11/2015  . Chest tightness 03/24/2015  . Stool incontinence 03/24/2015  . Urinary frequency 03/04/2015  . Cervical spondylosis with radiculopathy 02/24/2015  . Pre-syncope 02/19/2015  . URI (upper respiratory infection) 02/15/2015  . Neck pain 12/25/2014  . Pelvic pain in female 11/16/2014  . Primary osteoarthritis of hip 08/05/2014  . Heel pain 06/01/2014  . Diarrhea  06/01/2014  . Health care maintenance 06/01/2014  . Right leg pain 03/30/2014  . Right arm pain 03/30/2014  . Sinusitis 03/30/2014  . Internal nasal lesion 11/12/2013  . Other specified disorders of nose and nasal sinuses 11/12/2013  . Chronic tension-type headache, intractable 10/30/2013  . Intractable migraine without aura and without status migrainosus 10/30/2013  . Occipital neuralgia of left side 10/30/2013  . IBS (irritable bowel syndrome) 09/03/2013  . Rib pain 08/04/2013  . Cervical radiculitis 08/01/2013  . Lumbar radiculitis 08/01/2013  . Palpitations 07/25/2013  . Benign essential hypertension 05/28/2013  . Neuralgia, neuritis, and radiculitis, unspecified 05/28/2013  . Vitamin D deficiency 05/28/2013  . Elevated AFP 04/28/2013  . Abnormality of alpha-fetoprotein 04/28/2013  . Osteoporosis 11/12/2012  . Incomplete emptying of bladder 07/11/2012  . Prolapse of vaginal vault after hysterectomy 07/11/2012  . Nonspecific (abnormal) findings on radiological and other examination of gastrointestinal tract 05/31/2012  . Bronchiectasis ([place]) 02/05/2012  . Sleep apnea 02/05/2012  . Degenerative disc disease, cervical 02/05/2012  . Hypercholesterolemia 02/05/2012  . GERD (  gastroesophageal reflux disease) 02/05/2012  . Chronic headaches 02/05/2012   Lady Deutscher PT, DPT [phone] Lady Deutscher 07/19/2019, 2:32 PM  [facility] [address] [place], [place], [address] Phone: [phone]   Fax:  [phone]  Name: Jennifer Hodges MRN: [id number] Date of Birth: [birth year]

## 2019-07-23 ENCOUNTER — Inpatient Hospital Stay: Payer: Medicare Other | Attending: Oncology

## 2019-07-23 ENCOUNTER — Other Ambulatory Visit: Payer: Self-pay

## 2019-07-23 ENCOUNTER — Inpatient Hospital Stay: Payer: Medicare Other

## 2019-07-23 ENCOUNTER — Inpatient Hospital Stay (HOSPITAL_BASED_OUTPATIENT_CLINIC_OR_DEPARTMENT_OTHER): Payer: Medicare Other | Admitting: Oncology

## 2019-07-23 ENCOUNTER — Encounter: Payer: Self-pay | Admitting: Oncology

## 2019-07-23 VITALS — BP 129/70 | HR 75 | Temp 95.5°F | Resp 16 | Ht 62.0 in | Wt 161.0 lb

## 2019-07-23 DIAGNOSIS — D72819 Decreased white blood cell count, unspecified: Secondary | ICD-10-CM | POA: Diagnosis not present

## 2019-07-23 DIAGNOSIS — C3412 Malignant neoplasm of upper lobe, left bronchus or lung: Secondary | ICD-10-CM | POA: Insufficient documentation

## 2019-07-23 DIAGNOSIS — C770 Secondary and unspecified malignant neoplasm of lymph nodes of head, face and neck: Secondary | ICD-10-CM | POA: Diagnosis not present

## 2019-07-23 DIAGNOSIS — Z5112 Encounter for antineoplastic immunotherapy: Secondary | ICD-10-CM

## 2019-07-23 DIAGNOSIS — Z79899 Other long term (current) drug therapy: Secondary | ICD-10-CM | POA: Diagnosis not present

## 2019-07-23 DIAGNOSIS — Z95828 Presence of other vascular implants and grafts: Secondary | ICD-10-CM

## 2019-07-23 DIAGNOSIS — J449 Chronic obstructive pulmonary disease, unspecified: Secondary | ICD-10-CM | POA: Insufficient documentation

## 2019-07-23 DIAGNOSIS — D649 Anemia, unspecified: Secondary | ICD-10-CM | POA: Insufficient documentation

## 2019-07-23 DIAGNOSIS — R42 Dizziness and giddiness: Secondary | ICD-10-CM | POA: Diagnosis not present

## 2019-07-23 LAB — CBC WITH DIFFERENTIAL/PLATELET
Abs Immature Granulocytes: 0.01 10*3/uL (ref 0.00–0.07)
Basophils Absolute: 0 10*3/uL (ref 0.0–0.1)
Basophils Relative: 0 %
Eosinophils Absolute: 0.1 10*3/uL (ref 0.0–0.5)
Eosinophils Relative: 2 %
HCT: 35.8 % — ABNORMAL LOW (ref 36.0–46.0)
Hemoglobin: 12.3 g/dL (ref 12.0–15.0)
Immature Granulocytes: 0 %
Lymphocytes Relative: 21 %
Lymphs Abs: 0.8 10*3/uL (ref 0.7–4.0)
MCH: 30.8 pg (ref 26.0–34.0)
MCHC: 34.4 g/dL (ref 30.0–36.0)
MCV: 89.7 fL (ref 80.0–100.0)
Monocytes Absolute: 0.4 10*3/uL (ref 0.1–1.0)
Monocytes Relative: 10 %
Neutro Abs: 2.5 10*3/uL (ref 1.7–7.7)
Neutrophils Relative %: 67 %
Platelets: 245 10*3/uL (ref 150–400)
RBC: 3.99 MIL/uL (ref 3.87–5.11)
RDW: 12.5 % (ref 11.5–15.5)
WBC: 3.7 10*3/uL — ABNORMAL LOW (ref 4.0–10.5)
nRBC: 0 % (ref 0.0–0.2)

## 2019-07-23 LAB — COMPREHENSIVE METABOLIC PANEL
ALT: 17 U/L (ref 0–44)
AST: 20 U/L (ref 15–41)
Albumin: 4.1 g/dL (ref 3.5–5.0)
Alkaline Phosphatase: 77 U/L (ref 38–126)
Anion gap: 9 (ref 5–15)
BUN: 11 mg/dL (ref 8–23)
CO2: 27 mmol/L (ref 22–32)
Calcium: 9.1 mg/dL (ref 8.9–10.3)
Chloride: 102 mmol/L (ref 98–111)
Creatinine, Ser: 0.48 mg/dL (ref 0.44–1.00)
GFR calc Af Amer: >60 mL/min (ref >60)
GFR calc non Af Amer: >60 mL/min (ref >60)
Glucose, Bld: 93 mg/dL (ref 70–99)
Potassium: 4.2 mmol/L (ref 3.5–5.1)
Sodium: 138 mmol/L (ref 135–145)
Total Bilirubin: 0.7 mg/dL (ref 0.3–1.2)
Total Protein: 6.9 g/dL (ref 6.5–8.1)

## 2019-07-23 MED ORDER — HEPARIN SOD (PORK) LOCK FLUSH 100 UNIT/ML IV SOLN
500.0000 [IU] | Freq: Once | INTRAVENOUS | Status: AC | PRN
  Administered 2019-07-23: 500 [IU]
  Filled 2019-07-23: qty 5

## 2019-07-23 MED ORDER — SODIUM CHLORIDE 0.9 % IV SOLN
10.0000 mg/kg | Freq: Once | INTRAVENOUS | Status: AC
  Administered 2019-07-23: 740 mg via INTRAVENOUS
  Filled 2019-07-23: qty 10

## 2019-07-23 MED ORDER — SODIUM CHLORIDE 0.9% FLUSH
10.0000 mL | INTRAVENOUS | Status: DC | PRN
  Administered 2019-07-23: 10 mL via INTRAVENOUS
  Filled 2019-07-23: qty 10

## 2019-07-23 MED ORDER — SODIUM CHLORIDE 0.9 % IV SOLN
Freq: Once | INTRAVENOUS | Status: AC
  Filled 2019-07-23: qty 250

## 2019-07-23 MED ORDER — HEPARIN SOD (PORK) LOCK FLUSH 100 UNIT/ML IV SOLN
INTRAVENOUS | Status: AC
  Filled 2019-07-23: qty 5

## 2019-07-24 ENCOUNTER — Other Ambulatory Visit: Payer: Self-pay

## 2019-07-24 ENCOUNTER — Ambulatory Visit: Payer: Medicare Other | Admitting: Physical Therapy

## 2019-07-24 ENCOUNTER — Encounter: Payer: Self-pay | Admitting: Physical Therapy

## 2019-07-24 DIAGNOSIS — R42 Dizziness and giddiness: Secondary | ICD-10-CM

## 2019-07-24 DIAGNOSIS — R2681 Unsteadiness on feet: Secondary | ICD-10-CM

## 2019-07-24 DIAGNOSIS — H8111 Benign paroxysmal vertigo, right ear: Secondary | ICD-10-CM

## 2019-07-25 NOTE — Therapy (Signed)
[facility] [address] [place], [place], [address] Phone: [phone]   Fax:  [phone]  Physical Therapy Treatment  Patient Details  Name: Jennifer Hodges MRN: [id number] Date of Birth: 1945-12-05 Referring Provider (PT): Dr. Pryor Ochoa   Encounter Date: 07/24/2019  PT End of Session - 07/24/19 1442    Visit Number  2    Number of Visits  9    Date for PT Re-Evaluation  09/13/19    Authorization Type  Initial Eval 07/19/19 FOTO    PT Start Time  1438    PT Stop Time  1506    PT Time Calculation (min)  28 min    Activity Tolerance  Other (comment)   limited secondary to nausea and vomiting      Past Medical History:  Diagnosis Date  . Anemia   . Asthma    No Inhalers--Dr. [name] will order as needed  . Bronchiectasis (HCC)    mild  . Chronic headaches     followed by [facility] migraines  . COPD (chronic obstructive pulmonary disease) ([place])   . DDD (degenerative disc disease), lumbar   . Diabetes mellitus without complication [facility])    "doctor says I no longer have diabetes"    . Diverticulosis   . Family history of adverse reaction to anesthesia    PONV  . Gall stones    history of  . GERD (gastroesophageal reflux disease)    EGD 8/09- non bleeding erosive gastritis, documentd esophageal ulcerations.   . Hiatal hernia   . History of pneumonia   . Hypercholesterolemia   . IBS (irritable bowel syndrome)   . Malignant neoplasm of upper lobe of left lung ([place]) 12/27/2018  . Murmur   . Osteoarthritis    lumbar disc disease, left hip  . PONV (postoperative nausea and vomiting)    "Only with last hip and I believe it was due to the morphine"   . Sleep apnea    cpap asked to bring mask and tubing  . Vertigo   . Weakness of right side of body     Past Surgical History:  Procedure Laterality Date  . ABDOMINAL HYSTERECTOMY  age 71  . ANTERIOR CERVICAL DECOMP/DISCECTOMY FUSION N/A 02/24/2015   Procedure: CERVICAL  FOUR-FIVE, CERVICAL FIVE-SIX, CERVICAL SIX-SEVEN ANTERIOR CERVICAL DECOMPRESSION/DISCECTOMY FUSION ;  Surgeon: [name], MD;  Location: [place] [facility];  Service: Neurosurgery;  Laterality: N/A;  C45 C56 C67 anterior cervical decompression with fusion interbody prosthesis plating and bonegraft  . APPENDECTOMY    . BACK SURGERY     4th lumbar fusion  . BLADDER SURGERY N/A    with vaginal wall repair  . BREAST CYST ASPIRATION Bilateral    neg  . BREAST SURGERY Bilateral    cyst removed and reduction  . CARDIAC CATHETERIZATION  2014  . CARPAL TUNNEL RELEASE Right 02/11/2016   Procedure: CARPAL TUNNEL RELEASE;  Surgeon: [name], MD;  Location: [facility] ORS;  Service: Orthopedics;  Laterality: Right;  . CATARACT EXTRACTION W/ INTRAOCULAR LENS IMPLANT Bilateral 2015  . CHOLECYSTECTOMY    . EUS N/A 05/31/2012   Procedure: UPPER ENDOSCOPIC ULTRASOUND (EUS) LINEAR;  Surgeon: [name], MD;  Location: [facility];  Service: Endoscopy;  Laterality: N/A;  . EXCISIONAL HEMORRHOIDECTOMY    . JOINT REPLACEMENT Bilateral   . KNEE ARTHROSCOPY WITH LATERAL MENISECTOMY Right 07/07/2015   Procedure: KNEE ARTHROSCOPY WITH LATERAL MENISECTOMY, PARTIAL SYNOVECTOMY;  Surgeon: [name], MD;  Location:  [facility] ORS;  Service: Orthopedics;  Laterality: Right;  . LUMBAR LAMINECTOMY    . PORTA CATH INSERTION N/A 01/07/2019   Procedure: PORTA CATH INSERTION;  Surgeon: [name], MD;  Location: [place] CV LAB;  Service: Cardiovascular;  Laterality: N/A;  . REDUCTION MAMMAPLASTY  1990  . RIGHT OOPHORECTOMY    . TOTAL HIP ARTHROPLASTY Left 05/01/2014   Dr. [name]  . TOTAL HIP ARTHROPLASTY Right 08/05/2014   Procedure: TOTAL HIP ARTHROPLASTY ANTERIOR APPROACH;  Surgeon: [name], MD;  Location: [facility] ORS;  Service: Orthopedics;  Laterality: Right;  . ULNAR NERVE TRANSPOSITION Right 02/11/2016   Procedure: ULNAR NERVE DECOMPRESSION/TRANSPOSITION;  Surgeon: [name], MD;  Location: [facility]  ORS;  Service: Orthopedics;  Laterality: Right;  Marland Kitchen VIDEO BRONCHOSCOPY WITH ENDOBRONCHIAL ULTRASOUND Left 12/19/2018   Procedure: VIDEO BRONCHOSCOPY WITH ENDOBRONCHIAL ULTRASOUND, LEFT, SLEEP APNEA;  Surgeon: [name], MD;  Location: [facility] ORS;  Service: Thoracic;  Laterality: Left;    There were no vitals filed for this visit.  Subjective Assessment - 07/25/19 1430    Subjective  Patient states that she has continued to have dizziness.    Pertinent History  History obtained from patient as well as medical record. Patient reports that she has had dizzy spells in the past, but this was more than her usual symptoms. Patient reports she began to have dizziness again last year and then patient began to have symptoms of vertigo, nausea and vomiting in February 2021 when she woke up in the middle of the night with a bad headache and she tried to lift her head off the pillow.  Patient states she was nauseous and could not sleep on her left side at all. Patient reports that she was been seen by Dr. Pryor Ochoa and Dr. 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 Patient received Epley maneuver at ENT office for left BPPV in March. Patient states the treatments helped but it is still there. Patient had VNG study on 06/19/2019 that revealed abnormal smooth pursuits, saccades and all OPK's. 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 Patient reports that Dr. Pryor Ochoa felt that the chemotherapy drugs might have caused the inner ear damage and balance difficulties. Patient describes her dizziness as vertigo, unsteadiness and lightheadedness. Patient reports that she always has a certain amount of dizziness now and that it is worse at times. Patient reports that bending over, looking up, rolling over especially onto her left side, quick movements all  bring on her symptoms and that Meclizine and staying still help to decrease her symptoms some. Patient reports that if she looks down she will fall. Patient reports that she cannot sleep on her left side due to dizziness. Patient reports that she veers usually to the left when she walks. Patient reports that she has not been driving due to her dizziness symptoms. Patient sees Dr. Manuella Ghazi, neurologist. Patient has history of migraines, chronic tension headaches and left sided occipital neuralgia. Patient has family history of migraines and dizziness. Patient reports she always has a dull headache and different things can make it worse such as noise and bright light. She takes an injection once a month for migrianes and patient states that helps a lot. Patient reports the last full blown migraine that she has had was when she had the brain MRI on 05/08/2019 from the noise of the machine.  Patient reports she gets a repeat MRI every 3-4 months. 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    Diagnostic tests  MRI brain- normal study for age    Patient Stated Goals  decreased dizziness, improved balance, improved walking        Canalith Repositioning Maneuver:  On mat table, performed right Dix-Hallpike testing and it was positive with upbeating nystagmus of short duration without latency, but did not observe right torsional component. Patient reports 5/10 vertigo. Performed right canalith repositioning maneuver (CRT). Repeated right CRT for a total of 3 maneuvers with retesting between maneuvers with 30-60 holds in each treatment position. Patient denied vertigo and did not observe vertigo with the second and third maneuvers however performed Epley to try to ensure clearance of particles.  Patient reports to clinic with continued dizziness symptoms. Patient with positive right Dix-Hallpike test and performed Epley maneuvers to try to clear particles. Will plan on retesting Dix-Hallpike next visit and  performing Epley maneuvers if indicated. Once Dix-Hallpike tests are negative will beging VOR X 1 and vestibular and balance exercises. Patient would benefit from PT services in order to decrease patient's symptoms, address goals and to try to reduce patient's falls risk.    PT Education - 07/25/19 1430    Education Details  discussed plan of care and Epley maneuver    Person(s) Educated  Patient    Methods  Explanation    Comprehension  Verbalized understanding       PT Short Term Goals - 07/19/19 1332      PT SHORT TERM GOAL #1   Title  Patient will be independent in home exercise program to improve strength/mobility for better functional independence with ADLs and for self-management.    Time  4    Period  Weeks    Status  New    Target Date  08/16/19        PT Long Term Goals - 07/19/19 1327      PT LONG TERM GOAL #1   Title  Patient will reduce falls risk as indicated by Activities Specific Balance Confidence Scale (ABC) >67%.    Baseline  scored 13.3% on 07/19/2019    Time  8    Period  Weeks    Status  New    Target Date  09/13/19      PT LONG TERM GOAL #2   Title  Patient will reduce perceived disability to moderate levels as indicated by <70 on Dizziness Handicap Inventory [facility]).    Baseline  scored 92/100 on 07/19/19    Time  8    Period  Weeks    Status  New    Target Date  09/13/19      PT LONG TERM GOAL #3   Title  Patient will have improved functional status as evidenced by an increase of 10 points or more on the FOTO score.    Baseline  scored 45/100 on 07/19/19    Time  8    Period  Weeks    Status  New    Target Date  09/13/19      PT LONG TERM GOAL #4   Title  Patient will have improved functional status as evidenced by an increase of 10 points or more on the FOTO score.    Target Date  09/13/19            Plan - 07/25/19 1429    Clinical Impression Statement  Patient reports to clinic  with continued dizziness symptoms. Patient with positive  right Dix-Hallpike test and performed Epley maneuvers to try to clear particles. Will plan on retesting Dix-Hallpike next visit and performing Epley maneuvers if indicated. Once Dix-Hallpike tests are negative will beging VOR X 1 and vestibular and balance exercises. Patient would benefit from PT services in order to decrease patient's symptoms, address goals and to try to reduce patient's falls risk.    Personal Factors and Comorbidities  Past/Current Experience;Comorbidity 3+    Comorbidities  intractable migraine, left sided occipital neuralgia, COPD, sleep apnea, lung CA, chronic tension headaches    Examination-Activity Limitations  Bed Mobility;Bend;Reach Overhead;Locomotion Level;Stairs    Examination-Participation Restrictions  Cleaning;Driving;Yard Work    [employer]  Evolving/Moderate complexity    Rehab Potential  Fair    PT Frequency  1x / week    PT Duration  8 weeks    PT Treatment/Interventions  Canalith Repostioning;Gait training;Stair training;Therapeutic activities;Therapeutic exercise;Balance training;Neuromuscular re-education;Vestibular;Patient/family education    Consulted and Agree with Plan of Care  Patient       Patient will benefit from skilled therapeutic intervention in order to improve the following deficits and impairments:  Decreased balance, Difficulty walking, Dizziness  Visit Diagnosis: Dizziness and giddiness  BPPV (benign paroxysmal positional vertigo), right  Unsteadiness on feet     Problem List Patient Active Problem List   Diagnosis Date Noted  . Benign breast lumps 01/11/2019  . Gout 01/11/2019  . Hives 01/11/2019  . Lung disease 01/11/2019  . Migraine headache 01/11/2019  . Goals of care, counseling/discussion 12/27/2018  . Malignant neoplasm of upper lobe of left lung ([place]) 12/27/2018  . Night sweats 08/07/2016  . 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 Osteopenia of multiple sites 04/27/2016  . Left carpal  tunnel syndrome 02/02/2016  . Carotid stenosis, asymptomatic, bilateral 12/29/2015  . Prediabetes 09/28/2015  . Primary osteoarthritis involving multiple joints 09/28/2015  . Knee pain 05/11/2015  . Chest tightness 03/24/2015  . Stool incontinence 03/24/2015  . Urinary frequency 03/04/2015  . Cervical spondylosis with radiculopathy 02/24/2015  . Pre-syncope 02/19/2015  . URI (upper respiratory infection) 02/15/2015  . Neck pain 12/25/2014  . Pelvic pain in female 11/16/2014  . Primary osteoarthritis of hip 08/05/2014  . Heel pain 06/01/2014  . Diarrhea 06/01/2014  . Health care maintenance 06/01/2014  . Right leg pain 03/30/2014  . Right arm pain 03/30/2014  . Sinusitis 03/30/2014  . Internal nasal lesion 11/12/2013  . Other specified disorders of nose and nasal sinuses 11/12/2013  . Chronic tension-type headache, intractable 10/30/2013  . Intractable migraine without aura and without status migrainosus 10/30/2013  . Occipital neuralgia of left side 10/30/2013  . IBS (irritable bowel syndrome) 09/03/2013  . Rib pain 08/04/2013  . Cervical radiculitis 08/01/2013  . Lumbar radiculitis 08/01/2013  . Palpitations 07/25/2013  . Benign essential hypertension 05/28/2013  . Neuralgia, neuritis, and radiculitis, unspecified 05/28/2013  . Vitamin D deficiency 05/28/2013  . Elevated AFP 04/28/2013  . Abnormality of alpha-fetoprotein 04/28/2013  . Osteoporosis 11/12/2012  . Incomplete emptying of bladder 07/11/2012  . Prolapse of vaginal vault after hysterectomy 07/11/2012  . Nonspecific (abnormal) findings on radiological and other examination of gastrointestinal tract 05/31/2012  . Bronchiectasis ([place]) 02/05/2012  . Sleep apnea 02/05/2012  . Degenerative disc disease, cervical 02/05/2012  . Hypercholesterolemia 02/05/2012  . GERD (gastroesophageal reflux disease) 02/05/2012  . Chronic headaches 02/05/2012   Lady Deutscher PT, DPT [phone] Lady Deutscher 07/25/2019, 2:31 PM  [place]  [facility] [address] [place], [place], [address] Phone: [phone]   Fax:  [phone]  Name: Jennifer Hodges MRN: [id number] Date of Birth: April 28, 1945

## 2019-07-25 NOTE — Progress Notes (Signed)
Hematology/Oncology Consult note [facility]  [phone] [phone]  Patient Care Team: [name], MD as PCP - General (Family Medicine) Telford Nab, RN as Registered Nurse   Name of the patient: Jennifer Hodges  [id number]  May 06, 1945   Date of visit: 07/25/19  Diagnosis- Adenocarcinoma of the lung stage III T1b N2 M0   Chief complaint/ Reason for visit-on treatment assessment prior to cycle 8 of maintenance durvalumab  Heme/Onc history: patient is a 74 year old female who was seen by pulmonary and ENT for ongoing symptoms of bronchitis and possible laryngitis and received antibiotics for the same.She underwent CT chest in September 2020 which showed multiple bilateral lung nodules with associated mediastinal adenopathy and possible primary left upper lobe lung mass. This was followed by a PET CT scan which showed a left upper lobe lung mass measuring 1.9 x 1.2 cm with an SUV of 10.7. Conglomerate AP window adenopathy measuring 5.8 x 2.1 cm with an SUV of 14. She was also noted to have hypermetabolic left hilar adenopathy and left supraclavicular 0.7 cm lymph node with an SUV of 4.6. Noted to have a 7 mm right lower lobe lung nodule with faint metabolic activity. No evidence of other distant metastatic disease. This was followed by a CT super D chest without contrast. The right lower lobe lung nodules were somewhat larger as compared to September 2020. 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   Appetite and weight have remained stable.  Has ongoing problems of vertigo for which she sees ENT and neurology.  ECOG PS- 1 Pain scale- 0   Review of systems- Review of Systems  Constitutional: Negative for chills, fever, malaise/fatigue and weight loss.  HENT: Negative for congestion, ear discharge and nosebleeds.   Eyes: Negative for blurred vision.  Respiratory: Negative for cough, hemoptysis, sputum production, shortness of breath and wheezing.   Cardiovascular: Negative for chest pain, palpitations, orthopnea and claudication.  Gastrointestinal: Negative for abdominal pain, blood in stool, constipation, diarrhea, heartburn, melena, nausea and vomiting.  Genitourinary: Negative for dysuria, flank pain, frequency, hematuria and urgency.  Musculoskeletal: Negative for back pain, joint pain and myalgias.  Skin: Negative for rash.  Neurological: Negative for dizziness, tingling, focal weakness, seizures, weakness and headaches.  Endo/Heme/Allergies: Does not bruise/bleed easily.  Psychiatric/Behavioral: Negative for depression and suicidal ideas. The patient does not have insomnia.        Allergies  Allergen Reactions  . Aspirin Hives and Other (See Comments)    Difficulty breathing  . Celebrex [Celecoxib] Shortness Of Breath  . Morphine And Related Nausea And Vomiting and Swelling  . Adhesive [Tape] Other (See Comments)    Took top layer of skin off when removed.  . Clarithromycin Nausea And Vomiting  . Codeine Nausea And Vomiting  . Darvon [Propoxyphene Hcl] Nausea And Vomiting  . Demerol [Meperidine] Nausea And Vomiting  . Flonase [Fluticasone Propionate] Other (See Comments)    Fungal infection  . Simvastatin Other (See Comments)    "caused ulcers in mouth, and fever"  . Talwin [Pentazocine] Nausea And Vomiting     Past Medical History:  Diagnosis Date  . Anemia   .  Asthma    No Inhalers--Dr. [name] will order as needed  . Bronchiectasis (HCC)    mild  .  Chronic headaches     followed by [facility] migraines  . COPD (chronic obstructive pulmonary disease) ([place])   . DDD (degenerative disc disease), lumbar   . Diabetes mellitus without complication [facility])    "doctor says I no longer have diabetes"    . Diverticulosis   . Family history of adverse reaction to anesthesia    PONV  . Gall stones    history of  . GERD (gastroesophageal reflux disease)    EGD 8/09- non bleeding erosive gastritis, documentd esophageal ulcerations.   . Hiatal hernia   . History of pneumonia   . Hypercholesterolemia   . IBS (irritable bowel syndrome)   . Malignant neoplasm of upper lobe of left lung ([place]) 12/27/2018  . Murmur   . Osteoarthritis    lumbar disc disease, left hip  . PONV (postoperative nausea and vomiting)    "Only with last hip and I believe it was due to the morphine"   . Sleep apnea    cpap asked to bring mask and tubing  . Vertigo   . Weakness of right side of body      Past Surgical History:  Procedure Laterality Date  . ABDOMINAL HYSTERECTOMY  age 41  . ANTERIOR CERVICAL DECOMP/DISCECTOMY FUSION N/A 02/24/2015   Procedure: CERVICAL FOUR-FIVE, CERVICAL FIVE-SIX, CERVICAL SIX-SEVEN ANTERIOR CERVICAL DECOMPRESSION/DISCECTOMY FUSION ;  Surgeon: [name], MD;  Location: [place] [facility];  Service: Neurosurgery;  Laterality: N/A;  C45 C56 C67 anterior cervical decompression with fusion interbody prosthesis plating and bonegraft  . APPENDECTOMY    . BACK SURGERY     4th lumbar fusion  . BLADDER SURGERY N/A    with vaginal wall repair  . BREAST CYST ASPIRATION Bilateral    neg  . BREAST SURGERY Bilateral    cyst removed and reduction  . CARDIAC CATHETERIZATION  2014  . CARPAL TUNNEL RELEASE Right 02/11/2016   Procedure: CARPAL TUNNEL RELEASE;  Surgeon: [name], MD;  Location: [facility];  Service: Orthopedics;  Laterality: Right;  . CATARACT EXTRACTION W/ INTRAOCULAR LENS IMPLANT Bilateral 2015  . CHOLECYSTECTOMY    . EUS  N/A 05/31/2012   Procedure: UPPER ENDOSCOPIC ULTRASOUND (EUS) LINEAR;  Surgeon: [name], MD;  Location: [facility];  Service: Endoscopy;  Laterality: N/A;  . EXCISIONAL HEMORRHOIDECTOMY    . JOINT REPLACEMENT Bilateral   . KNEE ARTHROSCOPY WITH LATERAL MENISECTOMY Right 07/07/2015   Procedure: KNEE ARTHROSCOPY WITH LATERAL MENISECTOMY, PARTIAL SYNOVECTOMY;  Surgeon: [name], MD;  Location: [facility];  Service: Orthopedics;  Laterality: Right;  . LUMBAR LAMINECTOMY    . PORTA CATH INSERTION N/A 01/07/2019   Procedure: PORTA CATH INSERTION;  Surgeon: [name], MD;  Location: [place] [facility];  Service: Cardiovascular;  Laterality: N/A;  . REDUCTION MAMMAPLASTY  1990  . RIGHT OOPHORECTOMY    . TOTAL HIP ARTHROPLASTY Left 05/01/2014   Dr. [name]  . TOTAL HIP ARTHROPLASTY Right 08/05/2014   Procedure: TOTAL HIP ARTHROPLASTY ANTERIOR APPROACH;  Surgeon: [name], MD;  Location: [facility];  Service: Orthopedics;  Laterality: Right;  . ULNAR NERVE TRANSPOSITION Right 02/11/2016   Procedure: ULNAR NERVE DECOMPRESSION/TRANSPOSITION;  Surgeon: [name], MD;  Location: [facility];  Service: Orthopedics;  Laterality: Right;  Marland Kitchen VIDEO BRONCHOSCOPY WITH ENDOBRONCHIAL ULTRASOUND Left 12/19/2018   Procedure: VIDEO BRONCHOSCOPY WITH ENDOBRONCHIAL ULTRASOUND, LEFT, SLEEP APNEA;  Surgeon:  [name], MD;  Location: [facility];  Service: Thoracic;  Laterality: Left;    Social History   Socioeconomic History  . Marital status: Married    Spouse name: Not on file  . Number of children: Not on file  . Years of education: Not on file  . Highest education level: Not on file  Occupational History  . Not on file  Tobacco Use  . Smoking status: Former Smoker    Quit date: 06/13/2007    Years since quitting: 12.1  . Smokeless tobacco: Never Used  Substance and Sexual Activity  . Alcohol use: No    Alcohol/week: 0.0 standard drinks  . Drug use: No  . Sexual activity: Never    Other Topics Concern  . Not on file  Social History Narrative  . Not on file   Social Determinants of Health   Financial Resource Strain:   . Difficulty of Paying Living Expenses:   Food Insecurity:   . Worried About Charity fundraiser in the Last Year:   . Arboriculturist in the Last Year:   Transportation Needs:   . Film/video editor (Medical):   Marland Kitchen Lack of Transportation (Non-Medical):   Physical Activity:   . Days of Exercise per Week:   . Minutes of Exercise per Session:   Stress:   . Feeling of Stress :   Social Connections:   . Frequency of Communication with Friends and Family:   . Frequency of Social Gatherings with Friends and Family:   . Attends Religious Services:   . Active Member of Clubs or Organizations:   . Attends Archivist Meetings:   Marland Kitchen Marital Status:   Intimate Partner Violence:   . Fear of Current or Ex-Partner:   . Emotionally Abused:   Marland Kitchen Physically Abused:   . Sexually Abused:     Family History  Problem Relation Age of Onset  . Heart disease Mother        s/p stent  . Hypertension Mother   . Hypercholesterolemia Mother   . Diabetes Father   . Stomach cancer Other        uncle  . Breast cancer Cousin   . Breast cancer Paternal Aunt      Current Outpatient Medications:  .  albuterol (PROVENTIL) (2.5 MG/3ML) 0.083% nebulizer solution, Take 2.5 mg by nebulization every 6 (six) hours as needed for wheezing., Disp: , Rfl:  .  albuterol (VENTOLIN HFA) 108 (90 Base) MCG/ACT inhaler, Inhale 2 puffs into the lungs every 6 (six) hours as needed for wheezing or shortness of breath., Disp: 18 g, Rfl: 0 .  Bioflavonoid Products (ESTER C PO), Take 1 tablet by mouth 3 (three) times daily., Disp: , Rfl:  .  Calcium-Magnesium-Zinc (CAL-MAG-ZINC PO), Take 1 tablet by mouth daily., Disp: , Rfl:  .  cetirizine (ZYRTEC) 5 MG tablet, Take 1 tablet (5 mg total) by mouth daily. (Patient taking differently: Take 5 mg by mouth daily as needed  (runny nose.). ), Disp: 30 tablet, Rfl: 0 .  CHLORASEPTIC MAX SORE THROAT 1.5-33 % LIQD, Use as directed 1 spray in the mouth or throat as needed (throat irritation.)., Disp: , Rfl:  .  Cholecalciferol (EQL VITAMIN D3) 25 MCG (1000 UT) capsule, Take 4,000 Units by mouth daily., Disp: , Rfl:  .  CHROMIUM PO, Take 1 capsule by mouth daily., Disp: , Rfl:  .  Coenzyme Q10 (CO Q 10) 100 MG CAPS, Take 100 mg by mouth  daily. , Disp: , Rfl:  .  Cyanocobalamin (VITAMIN B-12) 2500 MCG SUBL, Place 2,500 mcg under the tongue daily., Disp: , Rfl:  .  cyclobenzaprine (FLEXERIL) 10 MG tablet, Take 10 mg by mouth at bedtime. , Disp: , Rfl:  .  DHEA 25 MG CAPS, Take 25 mg by mouth daily., Disp: , Rfl:  .  Ensure (ENSURE), Take 237 mLs by mouth 2 (two) times daily between meals., Disp: , Rfl:  .  ezetimibe (ZETIA) 10 MG tablet, Take 10 mg by mouth at bedtime., Disp: , Rfl:  .  folic acid (FOLVITE) 179 MCG tablet, Take 400 mcg by mouth 2 (two) times daily. , Disp: , Rfl:  .  Garlic 1505 MG CAPS, Take 1,000 mg by mouth daily., Disp: , Rfl:  .  Histamine Dihydrochloride (AUSTRALIAN DREAM ARTHRITIS EX), Apply 1 application topically 4 (four) times daily as needed (pain.)., Disp: , Rfl:  .  HYDROcodone-acetaminophen (NORCO/VICODIN) 5-325 MG tablet, 1-2 tabs po bid prn (Patient taking differently: Take 1 tablet by mouth at bedtime. ), Disp: 10 tablet, Rfl: 0 .  HYDROcodone-homatropine (HYCODAN) 5-1.5 MG/5ML syrup, Take 5 mLs by mouth 3 (three) times daily as needed for cough., Disp: 120 mL, Rfl: 0 .  ipratropium (ATROVENT) 0.06 % nasal spray, Place 2 sprays into both nostrils 4 (four) times daily as needed for rhinitis., Disp: 15 mL, Rfl: 0 .  meclizine (ANTIVERT) 25 MG tablet, Take 1 tablet (25 mg total) by mouth every 6 (six) hours as needed for dizziness., Disp: 30 tablet, Rfl: 0 .  montelukast (SINGULAIR) 10 MG tablet, Take 10 mg by mouth at bedtime. , Disp: , Rfl:  .  NON FORMULARY, Take 1 capsule by mouth daily.,  Disp: , Rfl:  .  ondansetron (ZOFRAN-ODT) 4 MG disintegrating tablet, Take 4 mg by mouth every 8 (eight) hours as needed for nausea/vomiting., Disp: , Rfl:  .  Polyethyl Glycol-Propyl Glycol (SYSTANE) 0.4-0.3 % SOLN, Place 1 drop into both eyes 5 (five) times daily as needed (dry/irritated eyes). , Disp: , Rfl:  .  pyridOXINE (VITAMIN B-6) 100 MG tablet, Take 100 mg by mouth daily., Disp: , Rfl:  .  Selenium 200 MCG CAPS, Take 200 mcg by mouth daily., Disp: , Rfl:  .  sucralfate (CARAFATE) 1 g tablet, 1 tablet 3 (three) times daily as needed. , Disp: , Rfl:  .  umeclidinium-vilanterol (ANORO ELLIPTA) 62.5-25 MCG/INH AEPB, Inhale 1 puff into the lungs as needed. , Disp: , Rfl:  .  vitamin E 400 UNIT capsule, Take 400 Units by mouth daily., Disp: , Rfl:  .  Zoledronic Acid (RECLAST IV), Inject 1 Dose into the vein as directed. ONCE A YEAR, Disp: , Rfl:   Physical exam:  Vitals:   07/23/19 0933  BP: 129/70  Pulse: 75  Resp: 16  Temp: (!) 95.5 F (35.3 C)  TempSrc: Tympanic  Weight: 161 lb (73 kg)  Height: '5\' 2"'$  (1.575 m)   Physical Exam HENT:     Head: Normocephalic and atraumatic.  Eyes:     Pupils: Pupils are equal, round, and reactive to light.  Cardiovascular:     Rate and Rhythm: Normal rate and regular rhythm.     Heart sounds: Normal heart sounds.  Pulmonary:     Effort: Pulmonary effort is normal.     Breath sounds: Normal breath sounds.  Abdominal:     General: Bowel sounds are normal.     Palpations: Abdomen is soft.  Musculoskeletal:  Cervical back: Normal range of motion.  Skin:    General: Skin is warm and dry.  Neurological:     Mental Status: She is alert and oriented to person, place, and time.      CMP Latest Ref Rng & Units 07/23/2019  Glucose 70 - 99 mg/dL 93  BUN 8 - 23 mg/dL 11  Creatinine 0.44 - 1.00 mg/dL 0.48  Sodium 135 - 145 mmol/L 138  Potassium 3.5 - 5.1 mmol/L 4.2  Chloride 98 - 111 mmol/L 102  CO2 22 - 32 mmol/L 27  Calcium 8.9 - 10.3  mg/dL 9.1  Total Protein 6.5 - 8.1 g/dL 6.9  Total Bilirubin 0.3 - 1.2 mg/dL 0.7  Alkaline Phos 38 - 126 U/L 77  AST 15 - 41 U/L 20  ALT 0 - 44 U/L 17   CBC Latest Ref Rng & Units 07/23/2019  WBC 4.0 - 10.5 K/uL 3.7(L)  Hemoglobin 12.0 - 15.0 g/dL 12.3  Hematocrit 36.0 - 46.0 % 35.8(L)  Platelets 150 - 400 K/uL 245    No images are attached to the encounter.  CT Chest W Contrast  Result Date: 07/02/2019 CLINICAL DATA:  Lung cancer.  Restaging. EXAM: CT CHEST, ABDOMEN, AND PELVIS WITH CONTRAST TECHNIQUE: Multidetector CT imaging of the chest, abdomen and pelvis was performed following the standard protocol during bolus administration of intravenous contrast. CONTRAST:  177m OMNIPAQUE IOHEXOL 300 MG/ML  SOLN COMPARISON:  03/22/2019 FINDINGS: CT CHEST FINDINGS Cardiovascular: The heart size is normal. No substantial pericardial effusion. Coronary artery calcification is evident. Atherosclerotic calcification is noted in the wall of the thoracic aorta. Right Port-A-Cath tip is positioned at the SVC/RA junction. Mediastinum/Nodes: 11 mm short axis low right paratracheal node is stable. There is no hilar lymphadenopathy. The esophagus has normal imaging features. There is no axillary lymphadenopathy. Lungs/Pleura: Centrilobular emphsyema noted. 5 mm anterior right upper lobe pulmonary nodule identified on the previous study has resolved in the interval. Anterior left upper lobe pulmonary nodule measured previously at 1.6 cm now measures 1.1 cm (48/4). 5 mm right lower lobe nodule identified on the previous study has nearly resolved measuring 1-2 mm today. There is a new ill-defined irregular opacity in the posterior left lower lobe measuring 2.6 x 2.0 cm on image 81/4. Similar sub solid nodular opacity in the medial left upper lobe visible on 70/4, measuring 1.6 x 1.4 cm. Musculoskeletal: No worrisome lytic or sclerotic osseous abnormality. CT ABDOMEN PELVIS FINDINGS Hepatobiliary: No suspicious focal  abnormality within the liver parenchyma. Intra and extrahepatic biliary duct dilatation again noted, similar to prior. Common bile duct in the head of pancreas is 7 mm in diameter, stable. Pancreas: Pancreas divisum anatomy evident.  Otherwise unremarkable. Spleen: No splenomegaly. No focal mass lesion. Adrenals/Urinary Tract: No adrenal nodule or mass. Right kidney unremarkable. Tiny hypodensities in the left kidney are too small to characterize but likely benign. No evidence for hydroureter. Distal ureters and bladder obscured by beam hardening artifact from bilateral hip replacement. Stomach/Bowel: Stomach is unremarkable. No gastric wall thickening. No evidence of outlet obstruction. Duodenum is normally positioned as is the ligament of Treitz. No small bowel wall thickening. No small bowel dilatation. The terminal ileum is normal. The appendix is not visualized, but there is no edema or inflammation in the region of the cecum. No gross colonic mass. No colonic wall thickening. Distal sigmoid colon and rectum obscured by artifact. Vascular/Lymphatic: There is abdominal aortic atherosclerosis without aneurysm. There is no gastrohepatic or hepatoduodenal ligament lymphadenopathy. No  retroperitoneal or mesenteric lymphadenopathy. Pelvic sidewalls obscured by artifact. Reproductive: Obscured by artifact. Other: No intraperitoneal free fluid. Musculoskeletal: Status post bilateral hip replacement. No worrisome lytic or sclerotic osseous abnormality. IMPRESSION: 1. Continued further decrease in size of previously identified bilateral index pulmonary nodules. 2. New areas of ill-defined/irregular consolidative opacity in the left lung. Infectious/inflammatory etiology suspected. Close follow-up recommended. 3. No evidence for metastatic disease in the abdomen/pelvis. 4.  Emphysema (ICD10-J43.9) and Aortic Atherosclerosis (ICD10-170.0) Electronically Signed   By: Misty Stanley M.D.   On: 07/02/2019 16:42   CT Abdomen  Pelvis W Contrast  Result Date: 07/02/2019 CLINICAL DATA:  Lung cancer.  Restaging. EXAM: CT CHEST, ABDOMEN, AND PELVIS WITH CONTRAST TECHNIQUE: Multidetector CT imaging of the chest, abdomen and pelvis was performed following the standard protocol during bolus administration of intravenous contrast. CONTRAST:  185m OMNIPAQUE IOHEXOL 300 MG/ML  SOLN COMPARISON:  03/22/2019 FINDINGS: CT CHEST FINDINGS Cardiovascular: The heart size is normal. No substantial pericardial effusion. Coronary artery calcification is evident. Atherosclerotic calcification is noted in the wall of the thoracic aorta. Right Port-A-Cath tip is positioned at the SVC/RA junction. Mediastinum/Nodes: 11 mm short axis low right paratracheal node is stable. There is no hilar lymphadenopathy. The esophagus has normal imaging features. There is no axillary lymphadenopathy. Lungs/Pleura: Centrilobular emphsyema noted. 5 mm anterior right upper lobe pulmonary nodule identified on the previous study has resolved in the interval. Anterior left upper lobe pulmonary nodule measured previously at 1.6 cm now measures 1.1 cm (48/4). 5 mm right lower lobe nodule identified on the previous study has nearly resolved measuring 1-2 mm today. There is a new ill-defined irregular opacity in the posterior left lower lobe measuring 2.6 x 2.0 cm on image 81/4. Similar sub solid nodular opacity in the medial left upper lobe visible on 70/4, measuring 1.6 x 1.4 cm. Musculoskeletal: No worrisome lytic or sclerotic osseous abnormality. CT ABDOMEN PELVIS FINDINGS Hepatobiliary: No suspicious focal abnormality within the liver parenchyma. Intra and extrahepatic biliary duct dilatation again noted, similar to prior. Common bile duct in the head of pancreas is 7 mm in diameter, stable. Pancreas: Pancreas divisum anatomy evident.  Otherwise unremarkable. Spleen: No splenomegaly. No focal mass lesion. Adrenals/Urinary Tract: No adrenal nodule or mass. Right kidney  unremarkable. Tiny hypodensities in the left kidney are too small to characterize but likely benign. No evidence for hydroureter. Distal ureters and bladder obscured by beam hardening artifact from bilateral hip replacement. Stomach/Bowel: Stomach is unremarkable. No gastric wall thickening. No evidence of outlet obstruction. Duodenum is normally positioned as is the ligament of Treitz. No small bowel wall thickening. No small bowel dilatation. The terminal ileum is normal. The appendix is not visualized, but there is no edema or inflammation in the region of the cecum. No gross colonic mass. No colonic wall thickening. Distal sigmoid colon and rectum obscured by artifact. Vascular/Lymphatic: There is abdominal aortic atherosclerosis without aneurysm. There is no gastrohepatic or hepatoduodenal ligament lymphadenopathy. No retroperitoneal or mesenteric lymphadenopathy. Pelvic sidewalls obscured by artifact. Reproductive: Obscured by artifact. Other: No intraperitoneal free fluid. Musculoskeletal: Status post bilateral hip replacement. No worrisome lytic or sclerotic osseous abnormality. IMPRESSION: 1. Continued further decrease in size of previously identified bilateral index pulmonary nodules. 2. New areas of ill-defined/irregular consolidative opacity in the left lung. Infectious/inflammatory etiology suspected. Close follow-up recommended. 3. No evidence for metastatic disease in the abdomen/pelvis. 4.  Emphysema (ICD10-J43.9) and Aortic Atherosclerosis (ICD10-170.0) Electronically Signed   By: EMisty StanleyM.D.   On: 07/02/2019 16:42  Assessment and plan- Patient is a 74 y.o. female with stage III adenocarcinoma of the lung.   She is here for on treatment assessment prior to cycle 8 of maintenance durvalumab  Counts okay to proceed with cycle 8 of maintenance durvalumab today.  She is tolerating it well without any significant side effects.  I will see her back in 2 weeks time for cycle 9  Vertigo:  Continue as needed meclizine.  She also sees ENT and neurology.     Visit Diagnosis 1. Malignant neoplasm of upper lobe of left lung ([place])   2. Encounter for antineoplastic immunotherapy      Dr. Randa Evens, MD, MPH [facility] [phone] 07/25/2019 3:28 PM

## 2019-07-26 ENCOUNTER — Ambulatory Visit: Payer: Medicare Other

## 2019-07-30 NOTE — Progress Notes (Signed)
Pharmacist Chemotherapy Monitoring - Follow Up Assessment    I verify that I have reviewed each item in the below checklist:  . Regimen for the patient is scheduled for the appropriate day and plan matches scheduled date. Marland Kitchen Appropriate non-routine labs are ordered dependent on drug ordered. . If applicable, additional medications reviewed and ordered per protocol based on lifetime cumulative doses and/or treatment regimen.   Plan for follow-up and/or issues identified: Yes . I-vent associated with next due treatment: No . MD and/or nursing notified: Yes -need orders and tsh  [name] 07/30/2019 8:32 AM

## 2019-08-02 ENCOUNTER — Ambulatory Visit: Payer: Medicare Other | Admitting: Physical Therapy

## 2019-08-02 ENCOUNTER — Encounter: Payer: Self-pay | Admitting: Physical Therapy

## 2019-08-02 ENCOUNTER — Other Ambulatory Visit: Payer: Self-pay

## 2019-08-02 DIAGNOSIS — R42 Dizziness and giddiness: Secondary | ICD-10-CM

## 2019-08-02 DIAGNOSIS — R2681 Unsteadiness on feet: Secondary | ICD-10-CM

## 2019-08-02 NOTE — Therapy (Signed)
[place] [facility] [address] [place], [place], [address] Phone: [phone]   Fax:  [phone]  Physical Therapy Treatment  Patient Details  Name: Jennifer Hodges MRN: [id number] Date of Birth: Jun 13, 1945 Referring Provider (PT): Dr. Pryor Ochoa   Encounter Date: 08/02/2019  PT End of Session - 08/02/19 1008    Visit Number  3    Number of Visits  9    Date for PT Re-Evaluation  09/13/19    Authorization Type  Initial Eval 07/19/19 FOTO    PT Start Time  1007    PT Stop Time  1046    PT Time Calculation (min)  39 min    Activity Tolerance  Patient tolerated treatment well    Behavior During Therapy  [facility] for tasks assessed/performed       Past Medical History:  Diagnosis Date  . Anemia   . Asthma    No Inhalers--Dr. [name] will order as needed  . Bronchiectasis (HCC)    mild  . Chronic headaches     followed by [facility] migraines  . COPD (chronic obstructive pulmonary disease) ([place])   . DDD (degenerative disc disease), lumbar   . Diabetes mellitus without complication [facility])    "doctor says I no longer have diabetes"    . Diverticulosis   . Family history of adverse reaction to anesthesia    PONV  . Gall stones    history of  . GERD (gastroesophageal reflux disease)    EGD 8/09- non bleeding erosive gastritis, documentd esophageal ulcerations.   . Hiatal hernia   . History of pneumonia   . Hypercholesterolemia   . IBS (irritable bowel syndrome)   . Malignant neoplasm of upper lobe of left lung ([place]) 12/27/2018  . Murmur   . Osteoarthritis    lumbar disc disease, left hip  . PONV (postoperative nausea and vomiting)    "Only with last hip and I believe it was due to the morphine"   . Sleep apnea    cpap asked to bring mask and tubing  . Vertigo   . Weakness of right side of body     Past Surgical History:  Procedure Laterality Date  . ABDOMINAL HYSTERECTOMY  age 74  . ANTERIOR CERVICAL DECOMP/DISCECTOMY FUSION  N/A 02/24/2015   Procedure: CERVICAL FOUR-FIVE, CERVICAL FIVE-SIX, CERVICAL SIX-SEVEN ANTERIOR CERVICAL DECOMPRESSION/DISCECTOMY FUSION ;  Surgeon: [name], MD;  Location: [place] [facility];  Service: Neurosurgery;  Laterality: N/A;  C45 C56 C67 anterior cervical decompression with fusion interbody prosthesis plating and bonegraft  . APPENDECTOMY    . BACK SURGERY     4th lumbar fusion  . BLADDER SURGERY N/A    with vaginal wall repair  . BREAST CYST ASPIRATION Bilateral    neg  . BREAST SURGERY Bilateral    cyst removed and reduction  . CARDIAC CATHETERIZATION  2014  . CARPAL TUNNEL RELEASE Right 02/11/2016   Procedure: CARPAL TUNNEL RELEASE;  Surgeon: [name], MD;  Location: [facility] ORS;  Service: Orthopedics;  Laterality: Right;  . CATARACT EXTRACTION W/ INTRAOCULAR LENS IMPLANT Bilateral 2015  . CHOLECYSTECTOMY    . EUS N/A 05/31/2012   Procedure: UPPER ENDOSCOPIC ULTRASOUND (EUS) LINEAR;  Surgeon: [name], MD;  Location: [facility];  Service: Endoscopy;  Laterality: N/A;  . EXCISIONAL HEMORRHOIDECTOMY    . JOINT REPLACEMENT Bilateral   . KNEE ARTHROSCOPY WITH LATERAL MENISECTOMY Right 07/07/2015   Procedure: KNEE ARTHROSCOPY WITH LATERAL MENISECTOMY, PARTIAL SYNOVECTOMY;  Surgeon: [name], MD;  Location: [facility] ORS;  Service: Orthopedics;  Laterality: Right;  . LUMBAR LAMINECTOMY    . PORTA CATH INSERTION N/A 01/07/2019   Procedure: PORTA CATH INSERTION;  Surgeon: [name], MD;  [facility];  Service: Cardiovascular;  Laterality: N/A;  . REDUCTION MAMMAPLASTY  1990  . RIGHT OOPHORECTOMY    . TOTAL HIP ARTHROPLASTY Left 05/01/2014   Dr. [name]  . TOTAL HIP ARTHROPLASTY Right 08/05/2014   Procedure: TOTAL HIP ARTHROPLASTY ANTERIOR APPROACH;  Surgeon: [name], MD;  Location: [facility] ORS;  Service: Orthopedics;  Laterality: Right;  . ULNAR NERVE TRANSPOSITION Right 02/11/2016   Procedure: ULNAR NERVE DECOMPRESSION/TRANSPOSITION;   Surgeon: [name], MD;  Location: [facility] ORS;  Service: Orthopedics;  Laterality: Right;  Marland Kitchen VIDEO BRONCHOSCOPY WITH ENDOBRONCHIAL ULTRASOUND Left 12/19/2018   Procedure: VIDEO BRONCHOSCOPY WITH ENDOBRONCHIAL ULTRASOUND, LEFT, SLEEP APNEA;  Surgeon: [name], MD;  Location: [facility] ORS;  Service: Thoracic;  Laterality: Left;    There were no vitals filed for this visit.  Subjective Assessment - 08/02/19 1104    Subjective  Patient reports that she has not had any further episodes of vertigo since last week. Patient reports that she did have dizziness though. Patient states she was working on trying to take down a popcorn ceiling in her bathroom and she was looking up a lot and this caused some dizziness and states her neck is sore today.    Pertinent History  History obtained from patient as well as medical record. Patient reports that she has had dizzy spells in the past, but this was more than her usual symptoms. Patient reports she began to have dizziness again last year and then patient began to have symptoms of vertigo, nausea and vomiting in February 2021 when she woke up in the middle of the night with a bad headache and she tried to lift her head off the pillow.  Patient states she was nauseous and could not sleep on her left side at all. Patient reports that she was been seen by Dr. Pryor Ochoa and Dr. 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 Patient received Epley maneuver at ENT office for left BPPV in March. Patient states the treatments helped but it is still there. Patient had VNG study on 06/19/2019 that revealed abnormal smooth pursuits, saccades and all OPK's. 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 Patient reports that Dr. Pryor Ochoa felt that the chemotherapy drugs might have caused the inner ear damage and balance  difficulties. Patient describes her dizziness as vertigo, unsteadiness and lightheadedness. Patient reports that she always has a certain amount of dizziness now and that it is worse at times. Patient reports that bending over, looking up, rolling over especially onto her left side, quick movements all bring on her symptoms and that Meclizine and staying still help to decrease her symptoms some. Patient reports that if she looks down she will fall. Patient reports that she cannot sleep on her left side due to dizziness. Patient reports that she veers usually to the left when she walks. Patient reports that she has not been driving due to her dizziness symptoms. Patient sees Dr. Manuella Ghazi, neurologist. Patient has history of migraines, chronic tension headaches and left sided occipital neuralgia. Patient has family history of migraines and dizziness. Patient reports she always has a dull headache  and different things can make it worse such as noise and bright light. She takes an injection once a month for migrianes and patient states that helps a lot. Patient reports the last full blown migraine that she has had was when she had the brain MRI on 05/08/2019 from the noise of the machine. Patient reports she gets a repeat MRI every 3-4 months. 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    Diagnostic tests  MRI brain- normal study for age    Patient Stated Goals  decreased dizziness, improved balance, improved walking      Patient reports that she has not had any further episodes of vertigo since last week. Patient reports that she did have dizziness though. Patient states she was working on trying to take down a popcorn ceiling in her bathroom and she was looking up a lot and this caused some dizziness and states her neck is sore today.     Neuromuscular Re-education:  [facility] PT Assessment - 08/02/19 1031      Standardized Balance Assessment   Standardized Balance Assessment  Dynamic Gait Index       Dynamic Gait Index   Level Surface  Mild Impairment    Change in Gait Speed  Mild Impairment    Gait with Horizontal Head Turns  Moderate Impairment    Gait with Vertical Head Turns  Mild Impairment    Gait and Pivot Turn  Mild Impairment    Step Over Obstacle  Moderate Impairment    Step Around Obstacles  Normal    Steps  Moderate Impairment    Total Score  14        Neuromuscular Re-education:  Dix-Hallpike: Performed left and right Dix-Hallpike tests and both were negative with patient denying vertigo and no nystagmus observed.  VOR X 1 exercise:  Demonstrated and educated as to VOR X1.  Patient performed VOR X 1 horizontal in sitting 1 rep of 30 seconds and 1 rep of 1 minute with verbal cues for technique.  Patient reports the target is staying in focus and reports no dizziness.   Non-compliant/ Firm Surface: On firm surface, patient performed feet together progressions (progressed to feet together) and semi-tandem progressions with alternating lead leg with and without  horizontal and vertical head turns with CGA.  Patient performed narrow BOS with body turns/twist at waist with CGA.  Patient reports no dizziness, but states she feels off-balance.   Performed DGI and patient scored 14/24. Patient had mild imbalance with right turning.   Practiced HEP standing in corner with chair in front for safety and discussed safety with HEP.     PT Education - 08/02/19 1054    Education Details  issued feet together and semi-tandem progressions with vertical and horizontal head turns and feet together with twisting at waist for HEP; discussed and demonstrated how to perform HEP in corner with chair in front for safety    Person(s) Educated  Patient    Methods  Handout;Verbal cues;Demonstration;Explanation    Comprehension  Returned demonstration;Verbalized understanding       PT Short Term Goals - 07/19/19 1332      PT SHORT TERM GOAL #1   Title  Patient will be independent in  home exercise program to improve strength/mobility for better functional independence with ADLs and for self-management.    Time  4    Period  Weeks    Status  New    Target Date  08/16/19  PT Long Term Goals - 07/19/19 1327      PT LONG TERM GOAL #1   Title  Patient will reduce falls risk as indicated by Activities Specific Balance Confidence Scale (ABC) >67%.    Baseline  scored 13.3% on 07/19/2019    Time  8    Period  Weeks    Status  New    Target Date  09/13/19      PT LONG TERM GOAL #2   Title  Patient will reduce perceived disability to moderate levels as indicated by <70 on Dizziness Handicap Inventory [facility]).    Baseline  scored 92/100 on 07/19/19    Time  8    Period  Weeks    Status  New    Target Date  09/13/19      PT LONG TERM GOAL #3   Title  Patient will have improved functional status as evidenced by an increase of 10 points or more on the FOTO score.    Baseline  scored 45/100 on 07/19/19    Time  8    Period  Weeks    Status  New    Target Date  09/13/19      PT LONG TERM GOAL #4   Title  Patient will have improved functional status as evidenced by an increase of 10 points or more on the FOTO score.    Target Date  09/13/19            Plan - 08/02/19 1100    Clinical Impression Statement  Patient with bilateral negative Dix-Hallpike tests this date. Patient reports that she has not had any vertigo this past week but reports she has had dizziness and imbalance. Patient denied dizziness during session until the last 5-10 minutes where patient reports that she felt mild sway, but patient reports that she felt imbalance when performing standing activities. Patient challenged by feet together and semi-tandem stance with head turning and body turns. Patient issued HEP. Patient denied dizziness with VOR exercise. Patient would benefit from continued PT services to further address balance deficits, goals and functional deficits.    Personal Factors and  Comorbidities  Past/Current Experience;Comorbidity 3+    Comorbidities  intractable migraine, left sided occipital neuralgia, COPD, sleep apnea, lung CA, chronic tension headaches    Examination-Activity Limitations  Bed Mobility;Bend;Reach Overhead;Locomotion Level;Stairs    Examination-Participation Restrictions  Cleaning;Driving;Yard Work    [employer]  Evolving/Moderate complexity    Rehab Potential  Fair    PT Frequency  1x / week    PT Duration  8 weeks    PT Treatment/Interventions  Canalith Repostioning;Gait training;Stair training;Therapeutic activities;Therapeutic exercise;Balance training;Neuromuscular re-education;Vestibular;Patient/family education    Consulted and Agree with Plan of Care  Patient       Patient will benefit from skilled therapeutic intervention in order to improve the following deficits and impairments:  Decreased balance, Difficulty walking, Dizziness  Visit Diagnosis: Dizziness and giddiness  Unsteadiness on feet     Problem List Patient Active Problem List   Diagnosis Date Noted  . Benign breast lumps 01/11/2019  . Gout 01/11/2019  . Hives 01/11/2019  . Lung disease 01/11/2019  . Migraine headache 01/11/2019  . Goals of care, counseling/discussion 12/27/2018  . Malignant neoplasm of upper lobe of left lung ([place]) 12/27/2018  . Night sweats 08/07/2016  . 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 Osteopenia of multiple sites 04/27/2016  . Left carpal tunnel syndrome 02/02/2016  . Carotid stenosis, asymptomatic, bilateral 12/29/2015  .  Prediabetes 09/28/2015  . Primary osteoarthritis involving multiple joints 09/28/2015  . Knee pain 05/11/2015  . Chest tightness 03/24/2015  . Stool incontinence 03/24/2015  . Urinary frequency 03/04/2015  . Cervical spondylosis with radiculopathy 02/24/2015  . Pre-syncope 02/19/2015  . URI (upper respiratory infection) 02/15/2015  . Neck pain 12/25/2014  . Pelvic pain in female  11/16/2014  . Primary osteoarthritis of hip 08/05/2014  . Heel pain 06/01/2014  . Diarrhea 06/01/2014  . Health care maintenance 06/01/2014  . Right leg pain 03/30/2014  . Right arm pain 03/30/2014  . Sinusitis 03/30/2014  . Internal nasal lesion 11/12/2013  . Other specified disorders of nose and nasal sinuses 11/12/2013  . Chronic tension-type headache, intractable 10/30/2013  . Intractable migraine without aura and without status migrainosus 10/30/2013  . Occipital neuralgia of left side 10/30/2013  . IBS (irritable bowel syndrome) 09/03/2013  . Rib pain 08/04/2013  . Cervical radiculitis 08/01/2013  . Lumbar radiculitis 08/01/2013  . Palpitations 07/25/2013  . Benign essential hypertension 05/28/2013  . Neuralgia, neuritis, and radiculitis, unspecified 05/28/2013  . Vitamin D deficiency 05/28/2013  . Elevated AFP 04/28/2013  . Abnormality of alpha-fetoprotein 04/28/2013  . Osteoporosis 11/12/2012  . Incomplete emptying of bladder 07/11/2012  . Prolapse of vaginal vault after hysterectomy 07/11/2012  . Nonspecific (abnormal) findings on radiological and other examination of gastrointestinal tract 05/31/2012  . Bronchiectasis ([place]) 02/05/2012  . Sleep apnea 02/05/2012  . Degenerative disc disease, cervical 02/05/2012  . Hypercholesterolemia 02/05/2012  . GERD (gastroesophageal reflux disease) 02/05/2012  . Chronic headaches 02/05/2012   Lady Deutscher PT, DPT [phone] Lady Deutscher 08/02/2019, 11:04 AM  [place] [facility] [address] [place], [place], [address] Phone: [phone]   Fax:  [phone]  Name: DEENA SHAUB MRN: [id number] Date of Birth: 1946-02-06

## 2019-08-05 ENCOUNTER — Encounter: Payer: Self-pay | Admitting: Oncology

## 2019-08-05 NOTE — Progress Notes (Signed)
Patient called assessed/ pre- screened for appoinment  with oncologist. No concerns or other complaints.

## 2019-08-06 ENCOUNTER — Other Ambulatory Visit: Payer: Self-pay

## 2019-08-06 ENCOUNTER — Inpatient Hospital Stay (HOSPITAL_BASED_OUTPATIENT_CLINIC_OR_DEPARTMENT_OTHER): Payer: Medicare Other | Admitting: Oncology

## 2019-08-06 ENCOUNTER — Inpatient Hospital Stay: Payer: Medicare Other

## 2019-08-06 VITALS — BP 114/60 | HR 68 | Resp 18

## 2019-08-06 VITALS — HR 74 | Temp 98.8°F | Resp 16 | Ht 62.0 in | Wt 162.5 lb

## 2019-08-06 DIAGNOSIS — C3412 Malignant neoplasm of upper lobe, left bronchus or lung: Secondary | ICD-10-CM

## 2019-08-06 DIAGNOSIS — Z95828 Presence of other vascular implants and grafts: Secondary | ICD-10-CM

## 2019-08-06 DIAGNOSIS — Z5112 Encounter for antineoplastic immunotherapy: Secondary | ICD-10-CM | POA: Diagnosis not present

## 2019-08-06 LAB — CBC WITH DIFFERENTIAL/PLATELET
Abs Immature Granulocytes: 0.02 10*3/uL (ref 0.00–0.07)
Basophils Absolute: 0 10*3/uL (ref 0.0–0.1)
Basophils Relative: 0 %
Eosinophils Absolute: 0.1 10*3/uL (ref 0.0–0.5)
Eosinophils Relative: 3 %
HCT: 34.5 % — ABNORMAL LOW (ref 36.0–46.0)
Hemoglobin: 11.6 g/dL — ABNORMAL LOW (ref 12.0–15.0)
Immature Granulocytes: 1 %
Lymphocytes Relative: 24 %
Lymphs Abs: 0.8 10*3/uL (ref 0.7–4.0)
MCH: 30.4 pg (ref 26.0–34.0)
MCHC: 33.6 g/dL (ref 30.0–36.0)
MCV: 90.6 fL (ref 80.0–100.0)
Monocytes Absolute: 0.4 10*3/uL (ref 0.1–1.0)
Monocytes Relative: 11 %
Neutro Abs: 2.1 10*3/uL (ref 1.7–7.7)
Neutrophils Relative %: 61 %
Platelets: 218 10*3/uL (ref 150–400)
RBC: 3.81 MIL/uL — ABNORMAL LOW (ref 3.87–5.11)
RDW: 13.2 % (ref 11.5–15.5)
WBC: 3.4 10*3/uL — ABNORMAL LOW (ref 4.0–10.5)
nRBC: 0 % (ref 0.0–0.2)

## 2019-08-06 LAB — COMPREHENSIVE METABOLIC PANEL
ALT: 14 U/L (ref 0–44)
AST: 18 U/L (ref 15–41)
Albumin: 4 g/dL (ref 3.5–5.0)
Alkaline Phosphatase: 74 U/L (ref 38–126)
Anion gap: 7 (ref 5–15)
BUN: 13 mg/dL (ref 8–23)
CO2: 29 mmol/L (ref 22–32)
Calcium: 9 mg/dL (ref 8.9–10.3)
Chloride: 102 mmol/L (ref 98–111)
Creatinine, Ser: 0.53 mg/dL (ref 0.44–1.00)
GFR calc Af Amer: >60 mL/min (ref >60)
GFR calc non Af Amer: >60 mL/min (ref >60)
Glucose, Bld: 99 mg/dL (ref 70–99)
Potassium: 4.2 mmol/L (ref 3.5–5.1)
Sodium: 138 mmol/L (ref 135–145)
Total Bilirubin: 0.6 mg/dL (ref 0.3–1.2)
Total Protein: 6.6 g/dL (ref 6.5–8.1)

## 2019-08-06 LAB — TSH: TSH: 2.461 u[IU]/mL (ref 0.350–4.500)

## 2019-08-06 MED ORDER — HEPARIN SOD (PORK) LOCK FLUSH 100 UNIT/ML IV SOLN
500.0000 [IU] | Freq: Once | INTRAVENOUS | Status: AC | PRN
  Administered 2019-08-06: 500 [IU]
  Filled 2019-08-06: qty 5

## 2019-08-06 MED ORDER — SODIUM CHLORIDE 0.9% FLUSH
10.0000 mL | INTRAVENOUS | Status: DC | PRN
  Administered 2019-08-06: 10 mL via INTRAVENOUS
  Filled 2019-08-06: qty 10

## 2019-08-06 MED ORDER — SODIUM CHLORIDE 0.9 % IV SOLN
Freq: Once | INTRAVENOUS | Status: AC
  Filled 2019-08-06: qty 250

## 2019-08-06 MED ORDER — SODIUM CHLORIDE 0.9 % IV SOLN
10.0000 mg/kg | Freq: Once | INTRAVENOUS | Status: AC
  Administered 2019-08-06: 740 mg via INTRAVENOUS
  Filled 2019-08-06: qty 10

## 2019-08-06 MED ORDER — HEPARIN SOD (PORK) LOCK FLUSH 100 UNIT/ML IV SOLN
INTRAVENOUS | Status: AC
  Filled 2019-08-06: qty 5

## 2019-08-06 MED ORDER — SODIUM CHLORIDE 0.9% FLUSH
10.0000 mL | INTRAVENOUS | Status: DC | PRN
  Filled 2019-08-06: qty 10

## 2019-08-06 NOTE — Progress Notes (Signed)
Pt has been having cough more and she has productive cough and green sputum she does not feel that she had a fever but was sweating and hot-but thought it was from to tearing popcorn ceiling down, painting and moving furniture

## 2019-08-09 ENCOUNTER — Encounter: Payer: Self-pay | Admitting: Physical Therapy

## 2019-08-09 ENCOUNTER — Other Ambulatory Visit: Payer: Self-pay

## 2019-08-09 ENCOUNTER — Ambulatory Visit: Payer: Medicare Other | Admitting: Physical Therapy

## 2019-08-09 DIAGNOSIS — R2681 Unsteadiness on feet: Secondary | ICD-10-CM

## 2019-08-09 DIAGNOSIS — R42 Dizziness and giddiness: Secondary | ICD-10-CM

## 2019-08-09 NOTE — Therapy (Addendum)
[place] [facility] [address] [place], [place], [address] Phone: [phone]   Fax:  [phone]  Physical Therapy Treatment  Patient Details  Name: Jennifer Hodges MRN: [id number] Date of Birth: November 03, 1945 Referring Provider (PT): Dr. Pryor Ochoa   Encounter Date: 08/09/2019  PT End of Session - 08/09/19 0934    Visit Number  4    Number of Visits  9    Date for PT Re-Evaluation  09/13/19    Authorization Type  Initial Eval 07/19/19 FOTO    PT Start Time  0930    PT Stop Time  1013    PT Time Calculation (min)  43 min    Activity Tolerance  Patient tolerated treatment well    Behavior During Therapy  [facility] for tasks assessed/performed       Past Medical History:  Diagnosis Date  . Anemia   . Asthma    No Inhalers--Dr. [name] will order as needed  . Bronchiectasis (HCC)    mild  . Chronic headaches     followed by [facility] migraines  . COPD (chronic obstructive pulmonary disease) ([place])   . DDD (degenerative disc disease), lumbar   . Diabetes mellitus without complication [facility])    "doctor says I no longer have diabetes"    . Diverticulosis   . Family history of adverse reaction to anesthesia    PONV  . Gall stones    history of  . GERD (gastroesophageal reflux disease)    EGD 8/09- non bleeding erosive gastritis, documentd esophageal ulcerations.   . Hiatal hernia   . History of pneumonia   . Hypercholesterolemia   . IBS (irritable bowel syndrome)   . Malignant neoplasm of upper lobe of left lung ([place]) 12/27/2018  . Murmur   . Osteoarthritis    lumbar disc disease, left hip  . PONV (postoperative nausea and vomiting)    "Only with last hip and I believe it was due to the morphine"   . Sleep apnea    cpap asked to bring mask and tubing  . Vertigo   . Weakness of right side of body     Past Surgical History:  Procedure Laterality Date  . ABDOMINAL HYSTERECTOMY  age 21  . ANTERIOR CERVICAL DECOMP/DISCECTOMY FUSION  N/A 02/24/2015   Procedure: CERVICAL FOUR-FIVE, CERVICAL FIVE-SIX, CERVICAL SIX-SEVEN ANTERIOR CERVICAL DECOMPRESSION/DISCECTOMY FUSION ;  Surgeon: [name], MD;  Location: [place] [facility] ORS;  Service: Neurosurgery;  Laterality: N/A;  C45 C56 C67 anterior cervical decompression with fusion interbody prosthesis plating and bonegraft  . APPENDECTOMY    . BACK SURGERY     4th lumbar fusion  . BLADDER SURGERY N/A    with vaginal wall repair  . BREAST CYST ASPIRATION Bilateral    neg  . BREAST SURGERY Bilateral    cyst removed and reduction  . CARDIAC CATHETERIZATION  2014  . CARPAL TUNNEL RELEASE Right 02/11/2016   Procedure: CARPAL TUNNEL RELEASE;  Surgeon: [name], MD;  Location: [facility] ORS;  Service: Orthopedics;  Laterality: Right;  . CATARACT EXTRACTION W/ INTRAOCULAR LENS IMPLANT Bilateral 2015  . CHOLECYSTECTOMY    . EUS N/A 05/31/2012   Procedure: UPPER ENDOSCOPIC ULTRASOUND (EUS) LINEAR;  Surgeon: [name], MD;  Location: [facility];  Service: Endoscopy;  Laterality: N/A;  . EXCISIONAL HEMORRHOIDECTOMY    . JOINT REPLACEMENT Bilateral   . KNEE ARTHROSCOPY WITH LATERAL MENISECTOMY Right 07/07/2015   Procedure: KNEE ARTHROSCOPY WITH LATERAL MENISECTOMY, PARTIAL SYNOVECTOMY;  Surgeon: [name], MD;  Location: [facility] ORS;  Service: Orthopedics;  Laterality: Right;  . LUMBAR LAMINECTOMY    . PORTA CATH INSERTION N/A 01/07/2019   Procedure: PORTA CATH INSERTION;  Surgeon: [name], MD;  Location: [place] CV LAB;  Service: Cardiovascular;  Laterality: N/A;  . REDUCTION MAMMAPLASTY  1990  . RIGHT OOPHORECTOMY    . TOTAL HIP ARTHROPLASTY Left 05/01/2014   Dr. [name]  . TOTAL HIP ARTHROPLASTY Right 08/05/2014   Procedure: TOTAL HIP ARTHROPLASTY ANTERIOR APPROACH;  Surgeon: [name], MD;  Location: [facility] ORS;  Service: Orthopedics;  Laterality: Right;  . ULNAR NERVE TRANSPOSITION Right 02/11/2016   Procedure: ULNAR NERVE DECOMPRESSION/TRANSPOSITION;   Surgeon: [name], MD;  Location: [facility] ORS;  Service: Orthopedics;  Laterality: Right;  Marland Kitchen VIDEO BRONCHOSCOPY WITH ENDOBRONCHIAL ULTRASOUND Left 12/19/2018   Procedure: VIDEO BRONCHOSCOPY WITH ENDOBRONCHIAL ULTRASOUND, LEFT, SLEEP APNEA;  Surgeon: [name], MD;  Location: [facility] ORS;  Service: Thoracic;  Laterality: Left;    There were no vitals filed for this visit.  Subjective Assessment - 08/09/19 0932    Subjective  Patient reports that she did 10 minutes on her exercise bike and 10 minutes walking on a treadmill on [place] and that she did well but was tired. Patient reports she did good with the home exercise program standing in the corner and used the walker in front of her. Patient reports she can get floaty feeling at times with quick movements, but denies episodes of vertigo.    Pertinent History  History obtained from patient as well as medical record. Patient reports that she has had dizzy spells in the past, but this was more than her usual symptoms. Patient reports she began to have dizziness again last year and then patient began to have symptoms of vertigo, nausea and vomiting in February 2021 when she woke up in the middle of the night with a bad headache and she tried to lift her head off the pillow.  Patient states she was nauseous and could not sleep on her left side at all. Patient reports that she was been seen by Dr. Pryor Ochoa and Dr. 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 Patient received Epley maneuver at ENT office for left BPPV in March. Patient states the treatments helped but it is still there. Patient had VNG study on 06/19/2019 that revealed abnormal smooth pursuits, saccades and all OPK's. 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 Patient reports that Dr. Pryor Ochoa felt that the chemotherapy drugs  might have caused the inner ear damage and balance difficulties. Patient describes her dizziness as vertigo, unsteadiness and lightheadedness. Patient reports that she always has a certain amount of dizziness now and that it is worse at times. Patient reports that bending over, looking up, rolling over especially onto her left side, quick movements all bring on her symptoms and that Meclizine and staying still help to decrease her symptoms some. Patient reports that if she looks down she will fall. Patient reports that she cannot sleep on her left side due to dizziness. Patient reports that she veers usually to the left when she walks. Patient reports that she has not been driving due to her dizziness symptoms. Patient sees Dr. Manuella Ghazi, neurologist. Patient has history of migraines, chronic tension headaches and left sided occipital neuralgia. Patient has family history of migraines and  dizziness. Patient reports she always has a dull headache and different things can make it worse such as noise and bright light. She takes an injection once a month for migrianes and patient states that helps a lot. Patient reports the last full blown migraine that she has had was when she had the brain MRI on 05/08/2019 from the noise of the machine. Patient reports she gets a repeat MRI every 3-4 months. 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    Diagnostic tests  MRI brain- normal study for age    Patient Stated Goals  decreased dizziness, improved balance, improved walking    Pain Location  --   neck, shoulders and spine and knees   Pain Descriptors / Indicators  Aching    Pain Type  Chronic pain      Neuromuscular Re-education:  Airex pad:  On Airex pad, patient performed feet together progressions and semi-tandem progressions with alternating lead leg with and without horizontal and vertical head turns with CGA. Patient performed multiple 30 second reps of each.  Provided verbal cuing for foot placement.  Patient denied dizziness with these activities, but reports challenging for her balance.  Added progression of standing on pillow for HEP.  Mini-squats: Patient performed 15 reps mini-squats with 5 second holds with verbal cues to maintain upright posture on firm surface and then repeated 15 reps each foot on Airex pad. Issued for HEP.  Slow Marching: Attempted to perform on firm surface slow marching with 5 second holds but this was causing discomfort in her legs, left greater than right and therefore deferred.  Patient appeared to have buckling of left leg that she was able to self-correct using bar for support.    Step Taps: On firm surface, patient performed alternating step taps on 6" wooden step with CGA. Performed 15 reps each foot.  Repeated activity while standing on Airex pad. Performed 15 reps each foot.  Tandem walking: Patient performed tandem walking 8' times multiple reps 6-8. Patient initially stepped off line several times and was taking smaller steps and turning her foot more sideways, but improved with practice.   Patient motivated to session. Patient required more rest breaks this session secondary to fatigue. Reviewed HEP and patient able to progress to standing on pillow while doing feet together and semi-tandem progressions with head turns for HEP. Patient challenged by slow marching activity and this was added to HEP. Continuing to work on progressions of standing balance activities per patient tolerance. Patient would benefit from continued PT services to further address goals and functional deficits.    PT Education - 08/09/19 0934    Education Details  reviewed HEP and discussed foot placement/technique; issued mini-squats for HEP    Person(s) Educated  Patient    Methods  Explanation;Handout;Demonstration;Verbal cues    Comprehension  Verbalized understanding;Returned demonstration       PT Short Term Goals - 07/19/19 1332      PT SHORT TERM GOAL #1   Title   Patient will be independent in home exercise program to improve strength/mobility for better functional independence with ADLs and for self-management.    Time  4    Period  Weeks    Status  New    Target Date  08/16/19        PT Long Term Goals - 07/19/19 1327      PT LONG TERM GOAL #1   Title  Patient will reduce falls risk as indicated by Activities Specific  Balance Confidence Scale (ABC) >67%.    Baseline  scored 13.3% on 07/19/2019    Time  8    Period  Weeks    Status  New    Target Date  09/13/19      PT LONG TERM GOAL #2   Title  Patient will reduce perceived disability to moderate levels as indicated by <70 on Dizziness Handicap Inventory [facility]).    Baseline  scored 92/100 on 07/19/19    Time  8    Period  Weeks    Status  New    Target Date  09/13/19      PT LONG TERM GOAL #3   Title  Patient will have improved functional status as evidenced by an increase of 10 points or more on the FOTO score.    Baseline  scored 45/100 on 07/19/19    Time  8    Period  Weeks    Status  New    Target Date  09/13/19      PT LONG TERM GOAL #4   Title  Patient will have improved functional status as evidenced by an increase of 10 points or more on the FOTO score.    Target Date  09/13/19            Plan - 08/09/19 1151    Clinical Impression Statement  Patient motivated to session. Patient required more rest breaks this session secondary to fatigue. Reviewed HEP and patient able to progress to standing on pillow while doing feet together and semi-tandem progressions with head turns for HEP. Patient challenged by slow marching activity and this was added to HEP. Continuing to work on progressions of standing balance activities per patient tolerance. Patient would benefit from continued PT services to further address goals and functional deficits.    Personal Factors and Comorbidities  Past/Current Experience;Comorbidity 3+;Time since onset of injury/illness/exacerbation     Comorbidities  intractable migraine, left sided occipital neuralgia, COPD, sleep apnea, lung CA, chronic tension headaches    Examination-Activity Limitations  Bed Mobility;Bend;Reach Overhead;Locomotion Level;Stairs    Examination-Participation Restrictions  Cleaning;Driving;Yard Work    [employer]  Evolving/Moderate complexity    Rehab Potential  Fair    PT Frequency  1x / week    PT Duration  8 weeks    PT Treatment/Interventions  Canalith Repostioning;Gait training;Stair training;Therapeutic activities;Therapeutic exercise;Balance training;Neuromuscular re-education;Vestibular;Patient/family education    Consulted and Agree with Plan of Care  Patient       Patient will benefit from skilled therapeutic intervention in order to improve the following deficits and impairments:  Decreased balance, Difficulty walking, Dizziness  Visit Diagnosis: Dizziness and giddiness  Unsteadiness on feet     Problem List Patient Active Problem List   Diagnosis Date Noted  . Benign breast lumps 01/11/2019  . Gout 01/11/2019  . Hives 01/11/2019  . Lung disease 01/11/2019  . Migraine headache 01/11/2019  . Goals of care, counseling/discussion 12/27/2018  . Malignant neoplasm of upper lobe of left lung ([place]) 12/27/2018  . Night sweats 08/07/2016  . 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 Osteopenia of multiple sites 04/27/2016  . Left carpal tunnel syndrome 02/02/2016  . Carotid stenosis, asymptomatic, bilateral 12/29/2015  . Prediabetes 09/28/2015  . Primary osteoarthritis involving multiple joints 09/28/2015  . Knee pain 05/11/2015  . Chest tightness 03/24/2015  . Stool incontinence 03/24/2015  . Urinary frequency 03/04/2015  . Cervical spondylosis with radiculopathy 02/24/2015  . Pre-syncope 02/19/2015  . URI (upper respiratory infection) 02/15/2015  .  Neck pain 12/25/2014  . Pelvic pain in female 11/16/2014  . Primary osteoarthritis of hip 08/05/2014  . Heel  pain 06/01/2014  . Diarrhea 06/01/2014  . Health care maintenance 06/01/2014  . Right leg pain 03/30/2014  . Right arm pain 03/30/2014  . Sinusitis 03/30/2014  . Internal nasal lesion 11/12/2013  . Other specified disorders of nose and nasal sinuses 11/12/2013  . Chronic tension-type headache, intractable 10/30/2013  . Intractable migraine without aura and without status migrainosus 10/30/2013  . Occipital neuralgia of left side 10/30/2013  . IBS (irritable bowel syndrome) 09/03/2013  . Rib pain 08/04/2013  . Cervical radiculitis 08/01/2013  . Lumbar radiculitis 08/01/2013  . Palpitations 07/25/2013  . Benign essential hypertension 05/28/2013  . Neuralgia, neuritis, and radiculitis, unspecified 05/28/2013  . Vitamin D deficiency 05/28/2013  . Elevated AFP 04/28/2013  . Abnormality of alpha-fetoprotein 04/28/2013  . Osteoporosis 11/12/2012  . Incomplete emptying of bladder 07/11/2012  . Prolapse of vaginal vault after hysterectomy 07/11/2012  . Nonspecific (abnormal) findings on radiological and other examination of gastrointestinal tract 05/31/2012  . Bronchiectasis ([place]) 02/05/2012  . Sleep apnea 02/05/2012  . Degenerative disc disease, cervical 02/05/2012  . Hypercholesterolemia 02/05/2012  . GERD (gastroesophageal reflux disease) 02/05/2012  . Chronic headaches 02/05/2012   Lady Deutscher PT, DPT [phone] Lady Deutscher 08/09/2019, 11:53 AM  [facility] [address] [place], [place], [address] Phone: [phone]   Fax:  [phone]  Name: YANNIS GUMBS MRN: [id number] Date of Birth: 04-Oct-1945

## 2019-08-09 NOTE — Progress Notes (Signed)
Hematology/Oncology Consult note [facility]  Telephone:([phone] [phone]  Patient Care Team: [name], MD as PCP - General (Family Medicine) Telford Nab, RN as Registered Nurse   Name of the patient: Jennifer Hodges  [id number]  1946-01-22   Date of visit: 08/09/19  Diagnosis- Adenocarcinoma of the lung stage III T1b N2 M0  Chief complaint/ Reason for visit-on treatment assessment prior to cycle 9 of maintenance durvalumab  Heme/Onc history: patient is a 74 year old female who was seen by pulmonary and ENT for ongoing symptoms of bronchitis and possible laryngitis and received antibiotics for the same.She underwent CT chest in September 2020 which showed multiple bilateral lung nodules with associated mediastinal adenopathy and possible primary left upper lobe lung mass. This was followed by a PET CT scan which showed a left upper lobe lung mass measuring 1.9 x 1.2 cm with an SUV of 10.7. Conglomerate AP window adenopathy measuring 5.8 x 2.1 cm with an SUV of 14. She was also noted to have hypermetabolic left hilar adenopathy and left supraclavicular 0.7 cm lymph node with an SUV of 4.6. Noted to have a 7 mm right lower lobe lung nodule with faint metabolic activity. No evidence of other distant metastatic disease. This was followed by a CT super D chest without contrast. The right lower lobe lung nodules were somewhat larger as compared to September 2020. 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  She still has  her ongoing symptoms of vertigo to but has not had any falls.  Energy and appetite are stable.  Denies any shortness of breath.  Cough has remained stable  ECOG PS- 1 Pain scale- 0  Review of systems- Review of Systems  Constitutional: Negative for chills, fever, malaise/fatigue and weight loss.  HENT: Negative for congestion, ear discharge and nosebleeds.   Eyes: Negative for blurred vision.  Respiratory: Negative for cough, hemoptysis, sputum production, shortness of breath and wheezing.   Cardiovascular: Negative for chest pain, palpitations, orthopnea and claudication.  Gastrointestinal: Negative for abdominal pain, blood in stool, constipation, diarrhea, heartburn, melena, nausea and vomiting.  Genitourinary: Negative for dysuria, flank pain, frequency, hematuria and urgency.  Musculoskeletal: Negative for back pain, joint pain and myalgias.  Skin: Negative for rash.  Neurological: Negative for dizziness, tingling, focal weakness, seizures, weakness and headaches.  Endo/Heme/Allergies: Does not bruise/bleed easily.  Psychiatric/Behavioral: Negative for depression and suicidal ideas. The patient does not have insomnia.        Allergies  Allergen Reactions  . Aspirin Hives and Other (See Comments)    Difficulty breathing  . Celebrex [Celecoxib] Shortness Of Breath  . Morphine And Related Nausea And Vomiting and Swelling  . Adhesive [Tape] Other (See Comments)    Took top layer of skin off when removed.  . Clarithromycin Nausea And Vomiting  . Codeine Nausea And Vomiting  . Darvon [Propoxyphene Hcl] Nausea And Vomiting  . Demerol [Meperidine] Nausea And Vomiting  . Flonase [Fluticasone Propionate] Other (See Comments)    Fungal infection  . Simvastatin Other (See Comments)    "caused ulcers in mouth, and fever"  . Talwin [Pentazocine] Nausea And Vomiting  Past Medical History:  Diagnosis Date  . Anemia   . Asthma    No Inhalers--Dr. [name] will order as needed  .  Bronchiectasis (HCC)    mild  . Chronic headaches     followed by [facility] migraines  . COPD (chronic obstructive pulmonary disease) ([place])   . DDD (degenerative disc disease), lumbar   . Diabetes mellitus without complication [facility])    "doctor says I no longer have diabetes"    . Diverticulosis   . Family history of adverse reaction to anesthesia    PONV  . Gall stones    history of  . GERD (gastroesophageal reflux disease)    EGD 8/09- non bleeding erosive gastritis, documentd esophageal ulcerations.   . Hiatal hernia   . History of pneumonia   . Hypercholesterolemia   . IBS (irritable bowel syndrome)   . Malignant neoplasm of upper lobe of left lung ([place]) 12/27/2018  . Murmur   . Osteoarthritis    lumbar disc disease, left hip  . PONV (postoperative nausea and vomiting)    "Only with last hip and I believe it was due to the morphine"   . Sleep apnea    cpap asked to bring mask and tubing  . Vertigo   . Weakness of right side of body      Past Surgical History:  Procedure Laterality Date  . ABDOMINAL HYSTERECTOMY  age 12  . ANTERIOR CERVICAL DECOMP/DISCECTOMY FUSION N/A 02/24/2015   Procedure: CERVICAL FOUR-FIVE, CERVICAL FIVE-SIX, CERVICAL SIX-SEVEN ANTERIOR CERVICAL DECOMPRESSION/DISCECTOMY FUSION ;  Surgeon: [name], MD;  Location: [facility];  Service: Neurosurgery;  Laterality: N/A;  C45 C56 C67 anterior cervical decompression with fusion interbody prosthesis plating and bonegraft  . APPENDECTOMY    . BACK SURGERY     4th lumbar fusion  . BLADDER SURGERY N/A    with vaginal wall repair  . BREAST CYST ASPIRATION Bilateral    neg  . BREAST SURGERY Bilateral    cyst removed and reduction  . CARDIAC CATHETERIZATION  2014  . CARPAL TUNNEL RELEASE Right 02/11/2016   Procedure: CARPAL TUNNEL RELEASE;  Surgeon: [name], MD;  Location: [facility] ORS;  Service: Orthopedics;  Laterality: Right;  . CATARACT EXTRACTION W/ INTRAOCULAR LENS IMPLANT Bilateral  2015  . CHOLECYSTECTOMY    . EUS N/A 05/31/2012   Procedure: UPPER ENDOSCOPIC ULTRASOUND (EUS) LINEAR;  Surgeon: [name], MD;  Location: [facility];  Service: Endoscopy;  Laterality: N/A;  . EXCISIONAL HEMORRHOIDECTOMY    . JOINT REPLACEMENT Bilateral   . KNEE ARTHROSCOPY WITH LATERAL MENISECTOMY Right 07/07/2015   Procedure: KNEE ARTHROSCOPY WITH LATERAL MENISECTOMY, PARTIAL SYNOVECTOMY;  Surgeon: [name], MD;  Location: [facility] ORS;  Service: Orthopedics;  Laterality: Right;  . LUMBAR LAMINECTOMY    . PORTA CATH INSERTION N/A 01/07/2019   Procedure: PORTA CATH INSERTION;  Surgeon: [name], MD;  Location: [facility];  Service: Cardiovascular;  Laterality: N/A;  . REDUCTION MAMMAPLASTY  1990  . RIGHT OOPHORECTOMY    . TOTAL HIP ARTHROPLASTY Left 05/01/2014   Dr. [name]  . TOTAL HIP ARTHROPLASTY Right 08/05/2014   Procedure: TOTAL HIP ARTHROPLASTY ANTERIOR APPROACH;  Surgeon: [name], MD;  Location: [facility] ORS;  Service: Orthopedics;  Laterality: Right;  . ULNAR NERVE TRANSPOSITION Right 02/11/2016   Procedure: ULNAR NERVE DECOMPRESSION/TRANSPOSITION;  Surgeon: [name], MD;  Location: [facility];  Service: Orthopedics;  Laterality: Right;  Marland Kitchen VIDEO BRONCHOSCOPY WITH ENDOBRONCHIAL ULTRASOUND Left 12/19/2018  Procedure: VIDEO BRONCHOSCOPY WITH ENDOBRONCHIAL ULTRASOUND, LEFT, SLEEP APNEA;  Surgeon: [name], MD;  Location: [facility] ORS;  Service: Thoracic;  Laterality: Left;    Social History   Socioeconomic History  . Marital status: Married    Spouse name: Not on file  . Number of children: Not on file  . Years of education: Not on file  . Highest education level: Not on file  Occupational History  . Not on file  Tobacco Use  . Smoking status: Former Smoker    Quit date: 06/13/2007    Years since quitting: 12.1  . Smokeless tobacco: Never Used  Substance and Sexual Activity  . Alcohol use: No    Alcohol/week: 0.0 standard drinks  . Drug use:  No  . Sexual activity: Never  Other Topics Concern  . Not on file  Social History Narrative  . Not on file   Social Determinants of Health   Financial Resource Strain:   . Difficulty of Paying Living Expenses:   Food Insecurity:   . Worried About Charity fundraiser in the Last Year:   . Arboriculturist in the Last Year:   Transportation Needs:   . Film/video editor (Medical):   Marland Kitchen Lack of Transportation (Non-Medical):   Physical Activity:   . Days of Exercise per Week:   . Minutes of Exercise per Session:   Stress:   . Feeling of Stress :   Social Connections:   . Frequency of Communication with Friends and Family:   . Frequency of Social Gatherings with Friends and Family:   . Attends Religious Services:   . Active Member of Clubs or Organizations:   . Attends Archivist Meetings:   Marland Kitchen Marital Status:   Intimate Partner Violence:   . Fear of Current or Ex-Partner:   . Emotionally Abused:   Marland Kitchen Physically Abused:   . Sexually Abused:     Family History  Problem Relation Age of Onset  . Heart disease Mother        s/p stent  . Hypertension Mother   . Hypercholesterolemia Mother   . Diabetes Father   . Stomach cancer Other        uncle  . Breast cancer Cousin   . Breast cancer Paternal Aunt      Current Outpatient Medications:  .  albuterol (PROVENTIL) (2.5 MG/3ML) 0.083% nebulizer solution, Take 2.5 mg by nebulization every 6 (six) hours as needed for wheezing., Disp: , Rfl:  .  albuterol (VENTOLIN HFA) 108 (90 Base) MCG/ACT inhaler, Inhale 2 puffs into the lungs every 6 (six) hours as needed for wheezing or shortness of breath., Disp: 18 g, Rfl: 0 .  Bioflavonoid Products (ESTER C PO), Take 1 tablet by mouth 3 (three) times daily., Disp: , Rfl:  .  Calcium-Magnesium-Zinc (CAL-MAG-ZINC PO), Take 1 tablet by mouth daily., Disp: , Rfl:  .  cetirizine (ZYRTEC) 5 MG tablet, Take 1 tablet (5 mg total) by mouth daily. (Patient taking differently: Take 5 mg  by mouth daily as needed (runny nose.). ), Disp: 30 tablet, Rfl: 0 .  CHLORASEPTIC MAX SORE THROAT 1.5-33 % LIQD, Use as directed 1 spray in the mouth or throat as needed (throat irritation.)., Disp: , Rfl:  .  Cholecalciferol (EQL VITAMIN D3) 25 MCG (1000 UT) capsule, Take 4,000 Units by mouth daily., Disp: , Rfl:  .  CHROMIUM PO, Take 1 capsule by mouth daily., Disp: , Rfl:  .  Coenzyme Q10 (CO  Q 10) 100 MG CAPS, Take 100 mg by mouth daily. , Disp: , Rfl:  .  Cyanocobalamin (VITAMIN B-12) 2500 MCG SUBL, Place 2,500 mcg under the tongue daily., Disp: , Rfl:  .  cyclobenzaprine (FLEXERIL) 10 MG tablet, Take 10 mg by mouth at bedtime. , Disp: , Rfl:  .  DHEA 25 MG CAPS, Take 25 mg by mouth daily., Disp: , Rfl:  .  ezetimibe (ZETIA) 10 MG tablet, Take 10 mg by mouth at bedtime., Disp: , Rfl:  .  folic acid (FOLVITE) 250 MCG tablet, Take 400 mcg by mouth 2 (two) times daily. , Disp: , Rfl:  .  Garlic 0370 MG CAPS, Take 1,000 mg by mouth daily., Disp: , Rfl:  .  Histamine Dihydrochloride (AUSTRALIAN DREAM ARTHRITIS EX), Apply 1 application topically 4 (four) times daily as needed (pain.)., Disp: , Rfl:  .  HYDROcodone-acetaminophen (NORCO/VICODIN) 5-325 MG tablet, 1-2 tabs po bid prn (Patient taking differently: Take 1 tablet by mouth at bedtime. ), Disp: 10 tablet, Rfl: 0 .  HYDROcodone-homatropine (HYCODAN) 5-1.5 MG/5ML syrup, Take 5 mLs by mouth 3 (three) times daily as needed for cough., Disp: 120 mL, Rfl: 0 .  ipratropium (ATROVENT) 0.06 % nasal spray, Place 2 sprays into both nostrils 4 (four) times daily as needed for rhinitis., Disp: 15 mL, Rfl: 0 .  meclizine (ANTIVERT) 25 MG tablet, Take 1 tablet (25 mg total) by mouth every 6 (six) hours as needed for dizziness., Disp: 30 tablet, Rfl: 0 .  montelukast (SINGULAIR) 10 MG tablet, Take 10 mg by mouth at bedtime. , Disp: , Rfl:  .  NON FORMULARY, Take 1 capsule by mouth daily., Disp: , Rfl:  .  ondansetron (ZOFRAN-ODT) 4 MG disintegrating tablet,  Take 4 mg by mouth every 8 (eight) hours as needed for nausea/vomiting., Disp: , Rfl:  .  Polyethyl Glycol-Propyl Glycol (SYSTANE) 0.4-0.3 % SOLN, Place 1 drop into both eyes 5 (five) times daily as needed (dry/irritated eyes). , Disp: , Rfl:  .  pyridOXINE (VITAMIN B-6) 100 MG tablet, Take 100 mg by mouth daily., Disp: , Rfl:  .  Selenium 200 MCG CAPS, Take 200 mcg by mouth daily., Disp: , Rfl:  .  sucralfate (CARAFATE) 1 g tablet, 1 tablet 3 (three) times daily as needed. , Disp: , Rfl:  .  umeclidinium-vilanterol (ANORO ELLIPTA) 62.5-25 MCG/INH AEPB, Inhale 1 puff into the lungs as needed. , Disp: , Rfl:  .  vitamin E 400 UNIT capsule, Take 400 Units by mouth daily., Disp: , Rfl:  .  Zoledronic Acid (RECLAST IV), Inject 1 Dose into the vein as directed. ONCE A YEAR, Disp: , Rfl:  .  Ensure (ENSURE), Take 237 mLs by mouth 2 (two) times daily between meals., Disp: , Rfl:   Physical exam:  Vitals:   08/06/19 0927  Pulse: 74  Resp: 16  Temp: 98.8 F (37.1 C)  TempSrc: Oral  Weight: 162 lb 8 oz (73.7 kg)  Height: _0  (1.575 m)   Physical Exam Constitutional:      General: She is not in acute distress. Cardiovascular:     Rate and Rhythm: Normal rate and regular rhythm.     Heart sounds: Normal heart sounds.  Pulmonary:     Effort: Pulmonary effort is normal.     Breath sounds: Normal breath sounds.  Abdominal:     General: Bowel sounds are normal.     Palpations: Abdomen is soft.  Musculoskeletal:     Cervical back:  Normal range of motion.  Skin:    General: Skin is warm and dry.  Neurological:     Mental Status: She is alert and oriented to person, place, and time.      CMP Latest Ref Rng & Units 08/06/2019  Glucose 70 - 99 mg/dL 99  BUN 8 - 23 mg/dL 13  Creatinine 0.44 - 1.00 mg/dL 0.53  Sodium 135 - 145 mmol/L 138  Potassium 3.5 - 5.1 mmol/L 4.2  Chloride 98 - 111 mmol/L 102  CO2 22 - 32 mmol/L 29  Calcium 8.9 - 10.3 mg/dL 9.0  Total Protein 6.5 - 8.1 g/dL 6.6   Total Bilirubin 0.3 - 1.2 mg/dL 0.6  Alkaline Phos 38 - 126 U/L 74  AST 15 - 41 U/L 18  ALT 0 - 44 U/L 14   CBC Latest Ref Rng & Units 08/06/2019  WBC 4.0 - 10.5 K/uL 3.4(L)  Hemoglobin 12.0 - 15.0 g/dL 11.6(L)  Hematocrit 36.0 - 46.0 % 34.5(L)  Platelets 150 - 400 K/uL 218      Assessment and plan- Patient is a 74 y.o. female with stage III adenocarcinoma of the lung. She is here for on treatment assessment prior to cycle 9 of maintenance durvalumab  Counts okay to proceed with cycle 9 of maintenance durvalumab today.  I will see her back in 2 weeks for cycle 10.  She has mild leukopenia and anemia which waxes and wanes but overall stable. Differential mainly shows lymphopenia.  Continue to monitor  Vertigo- being followed by neurology and ENT   Visit Diagnosis 1. Encounter for antineoplastic immunotherapy   2. Malignant neoplasm of upper lobe of left lung ([place])      Dr. Randa Evens, MD, MPH [facility] at [facility] [phone] 08/09/2019 9:08 AM

## 2019-08-13 ENCOUNTER — Telehealth: Payer: Self-pay | Admitting: Oncology

## 2019-08-13 NOTE — Telephone Encounter (Signed)
Patient phoned on this date and stated that her husband would have a COVID screen on 08-19-19 for surgery on 08-21-19 and would need to be quarantined on 08-20-19. Patient stated that she needed to reschedule. Appts rescheduled for 08-26-19.

## 2019-08-16 ENCOUNTER — Encounter: Payer: Self-pay | Admitting: Physical Therapy

## 2019-08-16 ENCOUNTER — Ambulatory Visit: Payer: Medicare Other | Attending: Otolaryngology | Admitting: Physical Therapy

## 2019-08-16 ENCOUNTER — Other Ambulatory Visit: Payer: Self-pay

## 2019-08-16 DIAGNOSIS — R42 Dizziness and giddiness: Secondary | ICD-10-CM

## 2019-08-16 DIAGNOSIS — R2681 Unsteadiness on feet: Secondary | ICD-10-CM

## 2019-08-16 DIAGNOSIS — H8111 Benign paroxysmal vertigo, right ear: Secondary | ICD-10-CM | POA: Diagnosis present

## 2019-08-16 NOTE — Therapy (Signed)
[facility] [address] [place], [place], [address] Phone: [phone]   Fax:  [phone]  Physical Therapy Treatment  Patient Details  Name: Jennifer Hodges MRN: [id number] Date of Birth: 1945-10-26 Referring Provider (PT): Dr. Pryor Ochoa   Encounter Date: 08/16/2019  PT End of Session - 08/16/19 1136    Visit Number  5    Number of Visits  9    Date for PT Re-Evaluation  09/13/19    Authorization Type  Initial Eval 07/19/19 FOTO    PT Start Time  1014    PT Stop Time  1103    PT Time Calculation (min)  49 min    Equipment Utilized During Treatment  Gait belt    Activity Tolerance  Patient tolerated treatment well    Behavior During Therapy  [facility] for tasks assessed/performed       Past Medical History:  Diagnosis Date  . Anemia   . Asthma    No Inhalers--Dr. [name] will order as needed  . Bronchiectasis (HCC)    mild  . Chronic headaches     followed by [facility] migraines  . COPD (chronic obstructive pulmonary disease) ([place])   . DDD (degenerative disc disease), lumbar   . Diabetes mellitus without complication [facility])    "doctor says I no longer have diabetes"    . Diverticulosis   . Family history of adverse reaction to anesthesia    PONV  . Gall stones    history of  . GERD (gastroesophageal reflux disease)    EGD 8/09- non bleeding erosive gastritis, documentd esophageal ulcerations.   . Hiatal hernia   . History of pneumonia   . Hypercholesterolemia   . IBS (irritable bowel syndrome)   . Malignant neoplasm of upper lobe of left lung ([place]) 12/27/2018  . Murmur   . Osteoarthritis    lumbar disc disease, left hip  . PONV (postoperative nausea and vomiting)    "Only with last hip and I believe it was due to the morphine"   . Sleep apnea    cpap asked to bring mask and tubing  . Vertigo   . Weakness of right side of body     Past Surgical History:  Procedure Laterality Date  . ABDOMINAL HYSTERECTOMY  age 63   . ANTERIOR CERVICAL DECOMP/DISCECTOMY FUSION N/A 02/24/2015   Procedure: CERVICAL FOUR-FIVE, CERVICAL FIVE-SIX, CERVICAL SIX-SEVEN ANTERIOR CERVICAL DECOMPRESSION/DISCECTOMY FUSION ;  Surgeon: [name], MD;  Location: [facility];  Service: Neurosurgery;  Laterality: N/A;  C45 C56 C67 anterior cervical decompression with fusion interbody prosthesis plating and bonegraft  . APPENDECTOMY    . BACK SURGERY     4th lumbar fusion  . BLADDER SURGERY N/A    with vaginal wall repair  . BREAST CYST ASPIRATION Bilateral    neg  . BREAST SURGERY Bilateral    cyst removed and reduction  . CARDIAC CATHETERIZATION  2014  . CARPAL TUNNEL RELEASE Right 02/11/2016   Procedure: CARPAL TUNNEL RELEASE;  Surgeon: [name], MD;  Location: [facility] ORS;  Service: Orthopedics;  Laterality: Right;  . CATARACT EXTRACTION W/ INTRAOCULAR LENS IMPLANT Bilateral 2015  . CHOLECYSTECTOMY    . EUS N/A 05/31/2012   Procedure: UPPER ENDOSCOPIC ULTRASOUND (EUS) LINEAR;  Surgeon: [name], MD;  Location: [facility];  Service: Endoscopy;  Laterality: N/A;  . EXCISIONAL HEMORRHOIDECTOMY    . JOINT REPLACEMENT Bilateral   . KNEE ARTHROSCOPY WITH LATERAL MENISECTOMY Right 07/07/2015  Procedure: KNEE ARTHROSCOPY WITH LATERAL MENISECTOMY, PARTIAL SYNOVECTOMY;  Surgeon: [name], MD;  Location: [facility] ORS;  Service: Orthopedics;  Laterality: Right;  . LUMBAR LAMINECTOMY    . PORTA CATH INSERTION N/A 01/07/2019   Procedure: PORTA CATH INSERTION;  Surgeon: [name], MD;  Location: [facility];  Service: Cardiovascular;  Laterality: N/A;  . REDUCTION MAMMAPLASTY  1990  . RIGHT OOPHORECTOMY    . TOTAL HIP ARTHROPLASTY Left 05/01/2014   Dr. [name]  . TOTAL HIP ARTHROPLASTY Right 08/05/2014   Procedure: TOTAL HIP ARTHROPLASTY ANTERIOR APPROACH;  Surgeon: [name], MD;  Location: [facility] ORS;  Service: Orthopedics;  Laterality: Right;  . ULNAR NERVE TRANSPOSITION Right 02/11/2016   Procedure:  ULNAR NERVE DECOMPRESSION/TRANSPOSITION;  Surgeon: [name], MD;  Location: [facility] ORS;  Service: Orthopedics;  Laterality: Right;  Marland Kitchen VIDEO BRONCHOSCOPY WITH ENDOBRONCHIAL ULTRASOUND Left 12/19/2018   Procedure: VIDEO BRONCHOSCOPY WITH ENDOBRONCHIAL ULTRASOUND, LEFT, SLEEP APNEA;  Surgeon: [name], MD;  Location: [facility] ORS;  Service: Thoracic;  Laterality: Left;    There were no vitals filed for this visit.  Subjective Assessment - 08/16/19 1015    Subjective  Patient reports that she did her home exercise program and got a small cushion which she reports was more challenging but states she was able to do the exercises safely in the corner with her walker in front of her.    Pertinent History  History obtained from patient as well as medical record. Patient reports that she has had dizzy spells in the past, but this was more than her usual symptoms. Patient reports she began to have dizziness again last year and then patient began to have symptoms of vertigo, nausea and vomiting in February 2021 when she woke up in the middle of the night with a bad headache and she tried to lift her head off the pillow.  Patient states she was nauseous and could not sleep on her left side at all. Patient reports that she was been seen by Dr. Pryor Ochoa and Dr. 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 Patient received Epley maneuver at ENT office for left BPPV in March. Patient states the treatments helped but it is still there. Patient had VNG study on 06/19/2019 that revealed abnormal smooth pursuits, saccades and all OPK's. 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 Patient reports that Dr. Pryor Ochoa felt that the chemotherapy drugs might have caused the inner ear damage and balance difficulties. Patient describes her dizziness as vertigo,  unsteadiness and lightheadedness. Patient reports that she always has a certain amount of dizziness now and that it is worse at times. Patient reports that bending over, looking up, rolling over especially onto her left side, quick movements all bring on her symptoms and that Meclizine and staying still help to decrease her symptoms some. Patient reports that if she looks down she will fall. Patient reports that she cannot sleep on her left side due to dizziness. Patient reports that she veers usually to the left when she walks. Patient reports that she has not been driving due to her dizziness symptoms. Patient sees Dr. Manuella Ghazi, neurologist. Patient has history of migraines, chronic tension headaches and left sided occipital neuralgia. Patient has family history of migraines and dizziness. Patient reports she always has a dull headache and different things can make it worse such as  noise and bright light. She takes an injection once a month for migrianes and patient states that helps a lot. Patient reports the last full blown migraine that she has had was when she had the brain MRI on 05/08/2019 from the noise of the machine. Patient reports she gets a repeat MRI every 3-4 months. 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    Diagnostic tests  MRI brain- normal study for age    Patient Stated Goals  decreased dizziness, improved balance, improved walking       Neuromuscular Re-education:  Airex balance beam: On Airex balance beam, performed sideways stance static holds. Then performed sidestepping 5' times multiple reps about 8 with CGA.  On firm surface and then on Airex balance beam, performed tandem walking 5' times multiple reps with CGA.  Cone tapping: On firm surface, patient performed foot tapping to cones in series of one, two and then three targets as called out by therapist.  In standing on Airex pad, patient performed foot tapping to cones in series of one, two and then three cones  as called out by therapist with CGA.  Patient occasionally touching // bar for support due to losses of balance.  Step Ups: Patient performed step ups and back down on 6" wooden step without upper extremity support 15 reps with CGA. Patient able to perform without touching // bar. Then stood on Airex pad and performed step ups and back down on 6" wooden step without upper extremity support 15 reps with CGA.  Step Taps: On firm surface, patient performed alternating step taps on basketball placed on floor with CGA. Patient performed 15 taps each foot. Patient had one loss of balance where patient needed to touch // bar to regain balance.   Patient required fewer rest breaks this date and demonstrated good improvement with balance activities. Patient able to progress to tandem walking on Airex balance beam and step taps to basketball. Patient challenged by cone tapping while standing on Airex pad. Patient did well with step ups on 6" step without UEs support. Will plan on trying half foam roll and ball circles next session. Patient would benefit from continued PT services to further address balance deficits and goals.   PT Education - 08/16/19 1136    Education Details  exercise technique; discussed HEP    Person(s) Educated  Patient    Methods  Explanation;Verbal cues    Comprehension  Verbalized understanding       PT Short Term Goals - 08/16/19 1148      PT SHORT TERM GOAL #1   Title  Patient will be independent in home exercise program to improve strength/mobility for better functional independence with ADLs and for self-management.    Time  4    Period  Weeks    Status  Achieved    Target Date  08/16/19        PT Long Term Goals - 08/16/19 1148      PT LONG TERM GOAL #1   Title  Patient will reduce falls risk as indicated by Activities Specific Balance Confidence Scale (ABC) >67%.    Baseline  scored 13.3% on 07/19/2019    Time  8    Period  Weeks    Status  On-going      PT  LONG TERM GOAL #2   Title  Patient will reduce perceived disability to moderate levels as indicated by <70 on Dizziness Handicap Inventory [facility]).    Baseline  scored 92/100 on 07/19/19    Time  8    Period  Weeks    Status  On-going      PT LONG TERM GOAL #3   Title  Patient will have improved functional status as evidenced by an increase of 10 points or more on the FOTO score.    Baseline  scored 45/100 on 07/19/19    Time  8    Period  Weeks    Status  On-going            Plan - 08/16/19 1148    Clinical Impression Statement  Patient required fewer rest breaks this date and demonstrated good improvement with balance activities. Patient able to progress to tandem walking on Airex balance beam and step taps to basketball. Patient challenged by cone tapping while standing on Airex pad. Patient did well with step ups on 6" step without UEs support. Will plan on trying half foam roll and ball circles next session. Patient would benefit from continued PT services to further address balance deficits and goals.    Personal Factors and Comorbidities  Past/Current Experience;Comorbidity 3+;Time since onset of injury/illness/exacerbation    Comorbidities  intractable migraine, left sided occipital neuralgia, COPD, sleep apnea, lung CA, chronic tension headaches    Examination-Activity Limitations  Bed Mobility;Bend;Reach Overhead;Locomotion Level;Stairs    Examination-Participation Restrictions  Cleaning;Driving;Yard Work    [employer]  Evolving/Moderate complexity    Rehab Potential  Fair    PT Frequency  1x / week    PT Duration  8 weeks    PT Treatment/Interventions  Canalith Repostioning;Gait training;Stair training;Therapeutic activities;Therapeutic exercise;Balance training;Neuromuscular re-education;Vestibular;Patient/family education    Consulted and Agree with Plan of Care  Patient       Patient will benefit from skilled therapeutic intervention in order to  improve the following deficits and impairments:  Decreased balance, Difficulty walking, Dizziness  Visit Diagnosis: Dizziness and giddiness  Unsteadiness on feet     Problem List Patient Active Problem List   Diagnosis Date Noted  . Benign breast lumps 01/11/2019  . Gout 01/11/2019  . Hives 01/11/2019  . Lung disease 01/11/2019  . Migraine headache 01/11/2019  . Goals of care, counseling/discussion 12/27/2018  . Malignant neoplasm of upper lobe of left lung ([place]) 12/27/2018  . Night sweats 08/07/2016  . 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 Osteopenia of multiple sites 04/27/2016  . Left carpal tunnel syndrome 02/02/2016  . Carotid stenosis, asymptomatic, bilateral 12/29/2015  . Prediabetes 09/28/2015  . Primary osteoarthritis involving multiple joints 09/28/2015  . Knee pain 05/11/2015  . Chest tightness 03/24/2015  . Stool incontinence 03/24/2015  . Urinary frequency 03/04/2015  . Cervical spondylosis with radiculopathy 02/24/2015  . Pre-syncope 02/19/2015  . URI (upper respiratory infection) 02/15/2015  . Neck pain 12/25/2014  . Pelvic pain in female 11/16/2014  . Primary osteoarthritis of hip 08/05/2014  . Heel pain 06/01/2014  . Diarrhea 06/01/2014  . Health care maintenance 06/01/2014  . Right leg pain 03/30/2014  . Right arm pain 03/30/2014  . Sinusitis 03/30/2014  . Internal nasal lesion 11/12/2013  . Other specified disorders of nose and nasal sinuses 11/12/2013  . Chronic tension-type headache, intractable 10/30/2013  . Intractable migraine without aura and without status migrainosus 10/30/2013  . Occipital neuralgia of left side 10/30/2013  . IBS (irritable bowel syndrome) 09/03/2013  . Rib pain 08/04/2013  . Cervical radiculitis 08/01/2013  . Lumbar radiculitis 08/01/2013  . Palpitations 07/25/2013  . Benign essential hypertension 05/28/2013  .  Neuralgia, neuritis, and radiculitis, unspecified 05/28/2013  . Vitamin D deficiency 05/28/2013  .  Elevated AFP 04/28/2013  . Abnormality of alpha-fetoprotein 04/28/2013  . Osteoporosis 11/12/2012  . Incomplete emptying of bladder 07/11/2012  . Prolapse of vaginal vault after hysterectomy 07/11/2012  . Nonspecific (abnormal) findings on radiological and other examination of gastrointestinal tract 05/31/2012  . Bronchiectasis ([place]) 02/05/2012  . Sleep apnea 02/05/2012  . Degenerative disc disease, cervical 02/05/2012  . Hypercholesterolemia 02/05/2012  . GERD (gastroesophageal reflux disease) 02/05/2012  . Chronic headaches 02/05/2012   Lady Deutscher PT, DPT [phone] Lady Deutscher 08/16/2019, 11:49 AM  [facility] [address] [place], [place], [address] Phone: [phone]   Fax:  [phone]  Name: DEKLYNN CHARLET MRN: [id number] Date of Birth: 03-28-1945

## 2019-08-20 ENCOUNTER — Other Ambulatory Visit: Payer: Medicare Other

## 2019-08-20 ENCOUNTER — Ambulatory Visit: Payer: Medicare Other | Admitting: Oncology

## 2019-08-23 ENCOUNTER — Encounter: Payer: Self-pay | Admitting: Physical Therapy

## 2019-08-23 ENCOUNTER — Other Ambulatory Visit: Payer: Self-pay

## 2019-08-23 ENCOUNTER — Ambulatory Visit: Payer: Medicare Other | Admitting: Physical Therapy

## 2019-08-23 DIAGNOSIS — R2681 Unsteadiness on feet: Secondary | ICD-10-CM

## 2019-08-23 DIAGNOSIS — R42 Dizziness and giddiness: Secondary | ICD-10-CM | POA: Diagnosis not present

## 2019-08-23 DIAGNOSIS — H8111 Benign paroxysmal vertigo, right ear: Secondary | ICD-10-CM

## 2019-08-23 NOTE — Therapy (Signed)
[facility] [address] [place], [place], [address] Phone: [phone]   Fax:  [phone]  Physical Therapy Treatment  Patient Details  Name: Jennifer Hodges MRN: [id number] Date of Birth: 05/02/1945 Referring Provider (PT): Dr. Pryor Ochoa   Encounter Date: 08/23/2019   PT End of Session - 08/23/19 1017    Visit Number 6    Number of Visits 9    Date for PT Re-Evaluation 09/13/19    Authorization Type Initial Eval 07/19/19 FOTO    PT Start Time 1014    PT Stop Time 1056    PT Time Calculation (min) 42 min    Equipment Utilized During Treatment Gait belt    Activity Tolerance Patient tolerated treatment well    Behavior During Therapy [facility] for tasks assessed/performed           Past Medical History:  Diagnosis Date  . Anemia   . Asthma    No Inhalers--Dr. [name] will order as needed  . Bronchiectasis (HCC)    mild  . Chronic headaches     followed by [facility] migraines  . COPD (chronic obstructive pulmonary disease) ([place])   . DDD (degenerative disc disease), lumbar   . Diabetes mellitus without complication [facility])    "doctor says I no longer have diabetes"    . Diverticulosis   . Family history of adverse reaction to anesthesia    PONV  . Gall stones    history of  . GERD (gastroesophageal reflux disease)    EGD 8/09- non bleeding erosive gastritis, documentd esophageal ulcerations.   . Hiatal hernia   . History of pneumonia   . Hypercholesterolemia   . IBS (irritable bowel syndrome)   . Malignant neoplasm of upper lobe of left lung ([place]) 12/27/2018  . Murmur   . Osteoarthritis    lumbar disc disease, left hip  . PONV (postoperative nausea and vomiting)    "Only with last hip and I believe it was due to the morphine"   . Sleep apnea    cpap asked to bring mask and tubing  . Vertigo   . Weakness of right side of body     Past Surgical History:  Procedure Laterality Date  . ABDOMINAL HYSTERECTOMY  age 39   . ANTERIOR CERVICAL DECOMP/DISCECTOMY FUSION N/A 02/24/2015   Procedure: CERVICAL FOUR-FIVE, CERVICAL FIVE-SIX, CERVICAL SIX-SEVEN ANTERIOR CERVICAL DECOMPRESSION/DISCECTOMY FUSION ;  Surgeon: [name], MD;  Location: [place] [facility] OR[facility];  Service: Neurosurgery;  Laterality: N/A;  C45 C56 C67 anterior cervical decompression with fusion interbody prosthesis plating and bonegraft  . APPENDECTOMY    . BACK SURGERY     4th lumbar fusion  . BLADDER SURGERY N/A    with vaginal wall repair  . BREAST CYST ASPIRATION Bilateral    neg  . BREAST SURGERY Bilateral    cyst removed and reduction  . CARDIAC CATHETERIZATION  2014  . CARPAL TUNNEL RELEASE Right 02/11/2016   Procedure: CARPAL TUNNEL RELEASE;  Surgeon: [name], MD;  Location: [facility] ORS;  Service: Orthopedics;  Laterality: Right;  . CATARACT EXTRACTION W/ INTRAOCULAR LENS IMPLANT Bilateral 2015  . CHOLECYSTECTOMY    . EUS N/A 05/31/2012   Procedure: UPPER ENDOSCOPIC ULTRASOUND (EUS) LINEAR;  Surgeon: [name], MD;  Location: [facility] END[facility];  Service: Endoscopy;  Laterality: N/A;  . EXCISIONAL HEMORRHOIDECTOMY    . JOINT REPLACEMENT Bilateral   . KNEE ARTHROSCOPY WITH LATERAL MENISECTOMY Right 07/07/2015   Procedure: KNEE ARTHROSCOPY WITH  LATERAL MENISECTOMY, PARTIAL SYNOVECTOMY;  Surgeon: [name], MD;  Location: [facility] ORS;  Service: Orthopedics;  Laterality: Right;  . LUMBAR LAMINECTOMY    . PORTA CATH INSERTION N/A 01/07/2019   Procedure: PORTA CATH INSERTION;  Surgeon: [name], MD;  Location: [place] CV LAB;  Service: Cardiovascular;  Laterality: N/A;  . REDUCTION MAMMAPLASTY  1990  . RIGHT OOPHORECTOMY    . TOTAL HIP ARTHROPLASTY Left 05/01/2014   Dr. [name]  . TOTAL HIP ARTHROPLASTY Right 08/05/2014   Procedure: TOTAL HIP ARTHROPLASTY ANTERIOR APPROACH;  Surgeon: [name], MD;  Location: [facility] ORS;  Service: Orthopedics;  Laterality: Right;  . ULNAR NERVE TRANSPOSITION Right 02/11/2016   Procedure:  ULNAR NERVE DECOMPRESSION/TRANSPOSITION;  Surgeon: [name], MD;  Location: [facility] ORS;  Service: Orthopedics;  Laterality: Right;  Marland Kitchen VIDEO BRONCHOSCOPY WITH ENDOBRONCHIAL ULTRASOUND Left 12/19/2018   Procedure: VIDEO BRONCHOSCOPY WITH ENDOBRONCHIAL ULTRASOUND, LEFT, SLEEP APNEA;  Surgeon: [name], MD;  Location: [facility] ORS;  Service: Thoracic;  Laterality: Left;    There were no vitals filed for this visit.   Subjective Assessment - 08/23/19 1016    Subjective Patient reports that she got a little water in her left ear when she was washing her hair and that it has got her equilibrium off a little bit. Patient denies vertigo.    Pertinent History History obtained from patient as well as medical record. Patient reports that she has had dizzy spells in the past, but this was more than her usual symptoms. Patient reports she began to have dizziness again last year and then patient began to have symptoms of vertigo, nausea and vomiting in February 2021 when she woke up in the middle of the night with a bad headache and she tried to lift her head off the pillow.  Patient states she was nauseous and could not sleep on her left side at all. Patient reports that she was been seen by Dr. Pryor Ochoa and Dr. 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 Patient received Epley maneuver at ENT office for left BPPV in March. Patient states the treatments helped but it is still there. Patient had VNG study on 06/19/2019 that revealed abnormal smooth pursuits, saccades and all OPK's. 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 Patient reports that Dr. Pryor Ochoa felt that the chemotherapy drugs might have caused the inner ear damage and balance difficulties. Patient describes her dizziness as vertigo, unsteadiness and lightheadedness. Patient reports  that she always has a certain amount of dizziness now and that it is worse at times. Patient reports that bending over, looking up, rolling over especially onto her left side, quick movements all bring on her symptoms and that Meclizine and staying still help to decrease her symptoms some. Patient reports that if she looks down she will fall. Patient reports that she cannot sleep on her left side due to dizziness. Patient reports that she veers usually to the left when she walks. Patient reports that she has not been driving due to her dizziness symptoms. Patient sees Dr. Manuella Ghazi, neurologist. Patient has history of migraines, chronic tension headaches and left sided occipital neuralgia. Patient has family history of migraines and dizziness. Patient reports she always has a dull headache and different things can make it worse such as noise and bright light. She takes an injection once a month for migrianes  and patient states that helps a lot. Patient reports the last full blown migraine that she has had was when she had the brain MRI on 05/08/2019 from the noise of the machine. Patient reports she gets a repeat MRI every 3-4 months. 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    Diagnostic tests MRI brain- normal study for age    Patient Stated Goals decreased dizziness, improved balance, improved walking          Neuromuscular Re-education:  1/2 Foam Roll:  On 1/2 foam roll with flat side down and then round side down worked on static holds for 3-5 minutes each. Worked on 1/2 foam roll flat side down static holds with and without horizontal head turns.  Worked on 1/2 foam roll flat side down side-stepping L/R multiple reps.  Patient requiring assistance ranging from [place] to [place].  Patient demonstrated good ankle and hip stretegies.  Patient initially using reaching for support on // bars as primary strategy for loss of balance.  Ball tapping: On firm surface, patient performed alternating  foot tapping to basketball 10 reps each foot. Then, repeated activity standing on Airex pad, 10 reps each foot.  On Airex pad, patient performed 5 reps each foot forward/backward ball rolls multiple reps each foot.  Patient required CGA.   Star Pattern: Patient stood at center of star floor pattern and worked on tapping points of the star with one foot and then would switch to the other foot. Patient then worked on alternating feet tapping two targets. Patient did well with this activity with no loss of balance with CGA.   Wooden balance beam: On firm surface and then on wooden balance beam, patient performed tandem walking multiple reps times 6' each.  On wooden balance beam, patient performed static sideways stance with horizontal head turns and then sidestepping left/right without head turns multiple reps times 6'each.   Patient motivated to session. Patient demonstrated good ankle and hip strategies with half foam roll activities. Patient challenged by wooden balance beam activities and forward/backward ball rolls activities. Patient improving with alternating tapping activities on firm and foam surfaces with decreased use of UEs. Will plan on repeating functional outcome measures next session and assessing progress towards goals.     PT Education - 08/23/19 1213    Education Details discussed plan of care; exercise technique    Person(s) Educated Patient    Methods Explanation    Comprehension Verbalized understanding            PT Short Term Goals - 08/16/19 1148      PT SHORT TERM GOAL #1   Title Patient will be independent in home exercise program to improve strength/mobility for better functional independence with ADLs and for self-management.    Time 4    Period Weeks    Status Achieved    Target Date 08/16/19             PT Long Term Goals - 08/16/19 1148      PT LONG TERM GOAL #1   Title Patient will reduce falls risk as indicated by Activities Specific Balance  Confidence Scale (ABC) >67%.    Baseline scored 13.3% on 07/19/2019    Time 8    Period Weeks    Status On-going      PT LONG TERM GOAL #2   Title Patient will reduce perceived disability to moderate levels as indicated by <70 on Dizziness Handicap Inventory [facility]).    Baseline  scored 92/100 on 07/19/19    Time 8    Period Weeks    Status On-going      PT LONG TERM GOAL #3   Title Patient will have improved functional status as evidenced by an increase of 10 points or more on the FOTO score.    Baseline scored 45/100 on 07/19/19    Time 8    Period Weeks    Status On-going                 Plan - 08/23/19 1252    Clinical Impression Statement Patient motivated to session. Patient demonstrated good ankle and hip strategies with half foam roll activities. Patient challenged by wooden balance beam activities and forward/backward ball rolls activities. Patient improving with alternating tapping activities on firm and foam surfaces with decreased use of UEs. Will plan on repeating functional outcome measures next session and assessing progress towards goals.    Personal Factors and Comorbidities Past/Current Experience;Comorbidity 3+;Time since onset of injury/illness/exacerbation    Comorbidities intractable migraine, left sided occipital neuralgia, COPD, sleep apnea, lung CA, chronic tension headaches    Examination-Activity Limitations Bed Mobility;Bend;Reach Overhead;Locomotion Level;Stairs    Examination-Participation Restrictions Cleaning;Driving;Yard Work    [employer] Evolving/Moderate complexity    Rehab Potential Fair    PT Frequency 1x / week    PT Duration 8 weeks    PT Treatment/Interventions Canalith Repostioning;Gait training;Stair training;Therapeutic activities;Therapeutic exercise;Balance training;Neuromuscular re-education;Vestibular;Patient/family education    Consulted and Agree with Plan of Care Patient           Patient will benefit from  skilled therapeutic intervention in order to improve the following deficits and impairments:  Decreased balance, Difficulty walking, Dizziness  Visit Diagnosis: Dizziness and giddiness  Unsteadiness on feet  BPPV (benign paroxysmal positional vertigo), right     Problem List Patient Active Problem List   Diagnosis Date Noted  . Benign breast lumps 01/11/2019  . Gout 01/11/2019  . Hives 01/11/2019  . Lung disease 01/11/2019  . Migraine headache 01/11/2019  . Goals of care, counseling/discussion 12/27/2018  . Malignant neoplasm of upper lobe of left lung ([place]) 12/27/2018  . Night sweats 08/07/2016  . 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 Osteopenia of multiple sites 04/27/2016  . Left carpal tunnel syndrome 02/02/2016  . Carotid stenosis, asymptomatic, bilateral 12/29/2015  . Prediabetes 09/28/2015  . Primary osteoarthritis involving multiple joints 09/28/2015  . Knee pain 05/11/2015  . Chest tightness 03/24/2015  . Stool incontinence 03/24/2015  . Urinary frequency 03/04/2015  . Cervical spondylosis with radiculopathy 02/24/2015  . Pre-syncope 02/19/2015  . URI (upper respiratory infection) 02/15/2015  . Neck pain 12/25/2014  . Pelvic pain in female 11/16/2014  . Primary osteoarthritis of hip 08/05/2014  . Heel pain 06/01/2014  . Diarrhea 06/01/2014  . Health care maintenance 06/01/2014  . Right leg pain 03/30/2014  . Right arm pain 03/30/2014  . Sinusitis 03/30/2014  . Internal nasal lesion 11/12/2013  . Other specified disorders of nose and nasal sinuses 11/12/2013  . Chronic tension-type headache, intractable 10/30/2013  . Intractable migraine without aura and without status migrainosus 10/30/2013  . Occipital neuralgia of left side 10/30/2013  . IBS (irritable bowel syndrome) 09/03/2013  . Rib pain 08/04/2013  . Cervical radiculitis 08/01/2013  . Lumbar radiculitis 08/01/2013  . Palpitations 07/25/2013  . Benign essential hypertension 05/28/2013  .  Neuralgia, neuritis, and radiculitis, unspecified 05/28/2013  . Vitamin D deficiency 05/28/2013  . Elevated AFP 04/28/2013  . Abnormality of alpha-fetoprotein  04/28/2013  . Osteoporosis 11/12/2012  . Incomplete emptying of bladder 07/11/2012  . Prolapse of vaginal vault after hysterectomy 07/11/2012  . Nonspecific (abnormal) findings on radiological and other examination of gastrointestinal tract 05/31/2012  . Bronchiectasis ([place]) 02/05/2012  . Sleep apnea 02/05/2012  . Degenerative disc disease, cervical 02/05/2012  . Hypercholesterolemia 02/05/2012  . GERD (gastroesophageal reflux disease) 02/05/2012  . Chronic headaches 02/05/2012   Lady Deutscher PT, DPT [phone] Lady Deutscher 08/23/2019, 12:52 PM  [facility] [address] [place], [place], [address] Phone: [phone]   Fax:  [phone]  Name: Jennifer Hodges MRN: [id number] Date of Birth: 07-Sep-1945

## 2019-08-26 ENCOUNTER — Encounter: Payer: Self-pay | Admitting: Oncology

## 2019-08-26 ENCOUNTER — Inpatient Hospital Stay: Payer: Medicare Other | Attending: Oncology

## 2019-08-26 ENCOUNTER — Inpatient Hospital Stay (HOSPITAL_BASED_OUTPATIENT_CLINIC_OR_DEPARTMENT_OTHER): Payer: Medicare Other | Admitting: Oncology

## 2019-08-26 ENCOUNTER — Inpatient Hospital Stay: Payer: Medicare Other | Admitting: Oncology

## 2019-08-26 ENCOUNTER — Other Ambulatory Visit: Payer: Self-pay

## 2019-08-26 ENCOUNTER — Inpatient Hospital Stay: Payer: Medicare Other

## 2019-08-26 VITALS — BP 117/74 | HR 78 | Temp 97.3°F | Resp 18 | Wt 161.9 lb

## 2019-08-26 DIAGNOSIS — R42 Dizziness and giddiness: Secondary | ICD-10-CM | POA: Diagnosis not present

## 2019-08-26 DIAGNOSIS — R221 Localized swelling, mass and lump, neck: Secondary | ICD-10-CM | POA: Insufficient documentation

## 2019-08-26 DIAGNOSIS — K1230 Oral mucositis (ulcerative), unspecified: Secondary | ICD-10-CM | POA: Diagnosis not present

## 2019-08-26 DIAGNOSIS — C3412 Malignant neoplasm of upper lobe, left bronchus or lung: Secondary | ICD-10-CM

## 2019-08-26 DIAGNOSIS — Z5112 Encounter for antineoplastic immunotherapy: Secondary | ICD-10-CM

## 2019-08-26 LAB — CBC WITH DIFFERENTIAL/PLATELET
Abs Immature Granulocytes: 0.01 10*3/uL (ref 0.00–0.07)
Basophils Absolute: 0 10*3/uL (ref 0.0–0.1)
Basophils Relative: 0 %
Eosinophils Absolute: 0.1 10*3/uL (ref 0.0–0.5)
Eosinophils Relative: 3 %
HCT: 37.7 % (ref 36.0–46.0)
Hemoglobin: 12.4 g/dL (ref 12.0–15.0)
Immature Granulocytes: 0 %
Lymphocytes Relative: 20 %
Lymphs Abs: 0.7 10*3/uL (ref 0.7–4.0)
MCH: 30.2 pg (ref 26.0–34.0)
MCHC: 32.9 g/dL (ref 30.0–36.0)
MCV: 91.7 fL (ref 80.0–100.0)
Monocytes Absolute: 0.4 10*3/uL (ref 0.1–1.0)
Monocytes Relative: 10 %
Neutro Abs: 2.5 10*3/uL (ref 1.7–7.7)
Neutrophils Relative %: 67 %
Platelets: 220 10*3/uL (ref 150–400)
RBC: 4.11 MIL/uL (ref 3.87–5.11)
RDW: 13.2 % (ref 11.5–15.5)
WBC: 3.7 10*3/uL — ABNORMAL LOW (ref 4.0–10.5)
nRBC: 0 % (ref 0.0–0.2)

## 2019-08-26 LAB — COMPREHENSIVE METABOLIC PANEL
ALT: 16 U/L (ref 0–44)
AST: 22 U/L (ref 15–41)
Albumin: 4.3 g/dL (ref 3.5–5.0)
Alkaline Phosphatase: 80 U/L (ref 38–126)
Anion gap: 10 (ref 5–15)
BUN: 12 mg/dL (ref 8–23)
CO2: 28 mmol/L (ref 22–32)
Calcium: 9.2 mg/dL (ref 8.9–10.3)
Chloride: 101 mmol/L (ref 98–111)
Creatinine, Ser: 0.47 mg/dL (ref 0.44–1.00)
GFR calc Af Amer: >60 mL/min (ref >60)
GFR calc non Af Amer: >60 mL/min (ref >60)
Glucose, Bld: 98 mg/dL (ref 70–99)
Potassium: 5 mmol/L (ref 3.5–5.1)
Sodium: 139 mmol/L (ref 135–145)
Total Bilirubin: 0.7 mg/dL (ref 0.3–1.2)
Total Protein: 7 g/dL (ref 6.5–8.1)

## 2019-08-26 NOTE — Progress Notes (Signed)
Patient here for oncology follow-up appointment, expresses concerns of oral bleeding on last week, no energy and swollen neck lump, MD aware.

## 2019-08-27 ENCOUNTER — Inpatient Hospital Stay: Payer: Medicare Other

## 2019-08-27 VITALS — BP 123/63 | HR 78 | Temp 98.1°F | Resp 18

## 2019-08-27 DIAGNOSIS — C3412 Malignant neoplasm of upper lobe, left bronchus or lung: Secondary | ICD-10-CM

## 2019-08-27 DIAGNOSIS — Z5112 Encounter for antineoplastic immunotherapy: Secondary | ICD-10-CM | POA: Diagnosis not present

## 2019-08-27 MED ORDER — HEPARIN SOD (PORK) LOCK FLUSH 100 UNIT/ML IV SOLN
INTRAVENOUS | Status: AC
  Filled 2019-08-27: qty 5

## 2019-08-27 MED ORDER — HEPARIN SOD (PORK) LOCK FLUSH 100 UNIT/ML IV SOLN
500.0000 [IU] | Freq: Once | INTRAVENOUS | Status: AC | PRN
  Administered 2019-08-27: 500 [IU]
  Filled 2019-08-27: qty 5

## 2019-08-27 MED ORDER — SODIUM CHLORIDE 0.9 % IV SOLN
Freq: Once | INTRAVENOUS | Status: AC
  Filled 2019-08-27: qty 250

## 2019-08-27 MED ORDER — SODIUM CHLORIDE 0.9 % IV SOLN
10.0000 mg/kg | Freq: Once | INTRAVENOUS | Status: AC
  Administered 2019-08-27: 740 mg via INTRAVENOUS
  Filled 2019-08-27: qty 10

## 2019-08-27 NOTE — Progress Notes (Signed)
Hematology/Oncology Consult note [facility]  Telephone:([phone] [phone]  Patient Care Team: [name], MD as PCP - General (Family Medicine) Telford Nab, RN as Registered Nurse   Name of the patient: Jennifer Hodges  [id number]  01-25-1946   Date of visit: 08/27/19  Diagnosis- Adenocarcinoma of the lung stage III T1b N2 M0  Chief complaint/ Reason for visit-on treatment assessment prior to cycle 10 of maintenance durvalumab  Heme/Onc history: patient is a 74 year old female who was seen by pulmonary and ENT for ongoing symptoms of bronchitis and possible laryngitis and received antibiotics for the same.She underwent CT chest in September 2020 which showed multiple bilateral lung nodules with associated mediastinal adenopathy and possible primary left upper lobe lung mass. This was followed by a PET CT scan which showed a left upper lobe lung mass measuring 1.9 x 1.2 cm with an SUV of 10.7. Conglomerate AP window adenopathy measuring 5.8 x 2.1 cm with an SUV of 14. She was also noted to have hypermetabolic left hilar adenopathy and left supraclavicular 0.7 cm lymph node with an SUV of 4.6. Noted to have a 7 mm right lower lobe lung nodule with faint metabolic activity. No evidence of other distant metastatic disease. This was followed by a CT super D chest without contrast. The right lower lobe lung nodules were somewhat larger as compared to September 2020. 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  She also reports swelling along the midline/left side of her neck which has been causing her some pain especially when she swallows.  Reports ongoing fatigue denies other complaints  ECOG PS- 1 Pain scale- 0 Opioid associated constipation- no  Review of systems- Review of Systems  Constitutional: Positive for malaise/fatigue. Negative for chills, fever and weight loss.  HENT: Negative for congestion, ear discharge and nosebleeds.        Neck swelling  Eyes: Negative for blurred vision.  Respiratory: Negative for cough, hemoptysis, sputum production, shortness of breath and wheezing.   Cardiovascular: Negative for chest pain, palpitations, orthopnea and claudication.  Gastrointestinal: Negative for abdominal pain, blood in stool, constipation, diarrhea, heartburn, melena, nausea and vomiting.  Genitourinary: Negative for dysuria, flank pain, frequency, hematuria and urgency.  Musculoskeletal: Negative for back pain, joint pain and myalgias.  Skin: Negative for rash.  Neurological: Negative for dizziness, tingling, focal weakness, seizures, weakness and headaches.  Endo/Heme/Allergies: Does not bruise/bleed easily.  Psychiatric/Behavioral: Negative for depression and suicidal ideas. The patient does not have insomnia.       Allergies  Allergen Reactions  . Aspirin Hives and Other (See Comments)    Difficulty breathing  . Celebrex [Celecoxib] Shortness Of Breath  . Morphine And Related Nausea And Vomiting and Swelling  . Adhesive [Tape] Other (See Comments)    Took top layer of skin off when removed.  . Clarithromycin Nausea And Vomiting  . Codeine Nausea And Vomiting  . Darvon [Propoxyphene Hcl] Nausea And Vomiting  . Demerol [Meperidine] Nausea And Vomiting  . Flonase [Fluticasone Propionate] Other (See Comments)    Fungal infection  .  Simvastatin Other (See Comments)    "caused ulcers in mouth, and fever"  . Talwin  [Pentazocine] Nausea And Vomiting     Past Medical History:  Diagnosis Date  . Anemia   . Asthma    No Inhalers--Dr. [name] will order as needed  . Bronchiectasis (HCC)    mild  . Chronic headaches     followed by [facility] migraines  . COPD (chronic obstructive pulmonary disease) ([place])   . DDD (degenerative disc disease), lumbar   . Diabetes mellitus without complication [facility])    "doctor says I no longer have diabetes"    . Diverticulosis   . Family history of adverse reaction to anesthesia    PONV  . Gall stones    history of  . GERD (gastroesophageal reflux disease)    EGD 8/09- non bleeding erosive gastritis, documentd esophageal ulcerations.   . Hiatal hernia   . History of pneumonia   . Hypercholesterolemia   . IBS (irritable bowel syndrome)   . Malignant neoplasm of upper lobe of left lung ([place]) 12/27/2018  . Murmur   . Osteoarthritis    lumbar disc disease, left hip  . PONV (postoperative nausea and vomiting)    "Only with last hip and I believe it was due to the morphine"   . Sleep apnea    cpap asked to bring mask and tubing  . Vertigo   . Weakness of right side of body      Past Surgical History:  Procedure Laterality Date  . ABDOMINAL HYSTERECTOMY  age 27  . ANTERIOR CERVICAL DECOMP/DISCECTOMY FUSION N/A 02/24/2015   Procedure: CERVICAL FOUR-FIVE, CERVICAL FIVE-SIX, CERVICAL SIX-SEVEN ANTERIOR CERVICAL DECOMPRESSION/DISCECTOMY FUSION ;  Surgeon: [name], MD;  Location: [place] NEURO ORS;  Service: Neurosurgery;  Laterality: N/A;  C45 C56 C67 anterior cervical decompression with fusion interbody prosthesis plating and bonegraft  . APPENDECTOMY    . BACK SURGERY     4th lumbar fusion  . BLADDER SURGERY N/A    with vaginal wall repair  . BREAST CYST ASPIRATION Bilateral    neg  . BREAST SURGERY Bilateral    cyst removed and reduction  . CARDIAC CATHETERIZATION  2014  . CARPAL TUNNEL RELEASE Right 02/11/2016   Procedure: CARPAL TUNNEL  RELEASE;  Surgeon: [name], MD;  Location: [facility] ORS;  Service: Orthopedics;  Laterality: Right;  . CATARACT EXTRACTION W/ INTRAOCULAR LENS IMPLANT Bilateral 2015  . CHOLECYSTECTOMY    . EUS N/A 05/31/2012   Procedure: UPPER ENDOSCOPIC ULTRASOUND (EUS) LINEAR;  Surgeon: [name], MD;  Location: [facility];  Service: Endoscopy;  Laterality: N/A;  . EXCISIONAL HEMORRHOIDECTOMY    . JOINT REPLACEMENT Bilateral   . KNEE ARTHROSCOPY WITH LATERAL MENISECTOMY Right 07/07/2015   Procedure: KNEE ARTHROSCOPY WITH LATERAL MENISECTOMY, PARTIAL SYNOVECTOMY;  Surgeon: [name], MD;  Location: [facility] ORS;  Service: Orthopedics;  Laterality: Right;  . LUMBAR LAMINECTOMY    . PORTA CATH INSERTION N/A 01/07/2019   Procedure: PORTA CATH INSERTION;  Surgeon: [name], MD;  Location: [place] CV LAB;  Service: Cardiovascular;  Laterality: N/A;  . REDUCTION MAMMAPLASTY  1990  . RIGHT OOPHORECTOMY    . TOTAL HIP ARTHROPLASTY Left 05/01/2014   Dr. [name]  . TOTAL HIP ARTHROPLASTY Right 08/05/2014   Procedure: TOTAL HIP ARTHROPLASTY ANTERIOR APPROACH;  Surgeon: [name], MD;  Location: [facility] ORS;  Service: Orthopedics;  Laterality: Right;  . ULNAR NERVE TRANSPOSITION Right 02/11/2016   Procedure: ULNAR NERVE DECOMPRESSION/TRANSPOSITION;  Surgeon: [name], MD;  Location: [facility] ORS;  Service: Orthopedics;  Laterality: Right;  Marland Kitchen VIDEO BRONCHOSCOPY WITH ENDOBRONCHIAL ULTRASOUND Left 12/19/2018   Procedure: VIDEO BRONCHOSCOPY WITH ENDOBRONCHIAL ULTRASOUND, LEFT, SLEEP APNEA;  Surgeon: [name], MD;  Location: [facility] ORS;  Service: Thoracic;  Laterality: Left;    Social History   Socioeconomic History  . Marital status: Married    Spouse name: Not on file  . Number of children: Not on file  . Years of education: Not on file  . Highest education level: Not on file  Occupational History  . Not on file  Tobacco Use  . Smoking status: Former Smoker    Quit date: 06/13/2007     Years since quitting: 12.2  . Smokeless tobacco: Never Used  Vaping Use  . Vaping Use: Never used  Substance and Sexual Activity  . Alcohol use: No    Alcohol/week: 0.0 standard drinks  . Drug use: No  . Sexual activity: Never  Other Topics Concern  . Not on file  Social History Narrative  . Not on file   Social Determinants of Health   Financial Resource Strain:   . Difficulty of Paying Living Expenses:   Food Insecurity:   . Worried About Charity fundraiser in the Last Year:   . Arboriculturist in the Last Year:   Transportation Needs:   . Film/video editor (Medical):   Marland Kitchen Lack of Transportation (Non-Medical):   Physical Activity:   . Days of Exercise per Week:   . Minutes of Exercise per Session:   Stress:   . Feeling of Stress :   Social Connections:   . Frequency of Communication with Friends and Family:   . Frequency of Social Gatherings with Friends and Family:   . Attends Religious Services:   . Active Member of Clubs or Organizations:   . Attends Archivist Meetings:   Marland Kitchen Marital Status:   Intimate Partner Violence:   . Fear of Current or Ex-Partner:   . Emotionally Abused:   Marland Kitchen Physically Abused:   . Sexually Abused:     Family History  Problem Relation Age of Onset  . Heart disease Mother        s/p stent  . Hypertension Mother   . Hypercholesterolemia Mother   . Diabetes Father   . Stomach cancer Other        uncle  . Breast cancer Cousin   . Breast cancer Paternal Aunt      Current Outpatient Medications:  .  albuterol (PROVENTIL) (2.5 MG/3ML) 0.083% nebulizer solution, Take 2.5 mg by nebulization every 6 (six) hours as needed for wheezing., Disp: , Rfl:  .  albuterol (VENTOLIN HFA) 108 (90 Base) MCG/ACT inhaler, Inhale 2 puffs into the lungs every 6 (six) hours as needed for wheezing or shortness of breath., Disp: 18 g, Rfl: 0 .  Bioflavonoid Products (ESTER C PO), Take 1 tablet by mouth 3 (three) times daily., Disp: , Rfl:  .   Calcium-Magnesium-Zinc (CAL-MAG-ZINC PO), Take 1 tablet by mouth daily., Disp: , Rfl:  .  cetirizine (ZYRTEC) 5 MG tablet, Take 1 tablet (5 mg total) by mouth daily. (Patient taking differently: Take 5 mg by mouth daily as needed (runny nose.). ), Disp: 30 tablet, Rfl: 0 .  CHLORASEPTIC MAX SORE THROAT 1.5-33 % LIQD, Use as directed 1 spray in the mouth or throat as needed (throat irritation.)., Disp: , Rfl:  .  Cholecalciferol (EQL VITAMIN D3)  25 MCG (1000 UT) capsule, Take 4,000 Units by mouth daily., Disp: , Rfl:  .  CHROMIUM PO, Take 1 capsule by mouth daily., Disp: , Rfl:  .  Coenzyme Q10 (CO Q 10) 100 MG CAPS, Take 100 mg by mouth daily. , Disp: , Rfl:  .  Cyanocobalamin (VITAMIN B-12) 2500 MCG SUBL, Place 2,500 mcg under the tongue daily., Disp: , Rfl:  .  cyclobenzaprine (FLEXERIL) 10 MG tablet, Take 10 mg by mouth at bedtime. , Disp: , Rfl:  .  DHEA 25 MG CAPS, Take 25 mg by mouth daily., Disp: , Rfl:  .  Ensure (ENSURE), Take 237 mLs by mouth 2 (two) times daily between meals., Disp: , Rfl:  .  Erenumab-aooe (AIMOVIG) 140 MG/ML SOAJ, Aimovig Autoinjector 140 mg/mL subcutaneous auto-injector, Disp: , Rfl:  .  ezetimibe (ZETIA) 10 MG tablet, Take 10 mg by mouth at bedtime., Disp: , Rfl:  .  folic acid (FOLVITE) 229 MCG tablet, Take 400 mcg by mouth 2 (two) times daily. , Disp: , Rfl:  .  Garlic 7989 MG CAPS, Take 1,000 mg by mouth daily., Disp: , Rfl:  .  Histamine Dihydrochloride (AUSTRALIAN DREAM ARTHRITIS EX), Apply 1 application topically 4 (four) times daily as needed (pain.)., Disp: , Rfl:  .  HYDROcodone-acetaminophen (NORCO/VICODIN) 5-325 MG tablet, 1-2 tabs po bid prn (Patient taking differently: Take 1 tablet by mouth at bedtime. ), Disp: 10 tablet, Rfl: 0 .  HYDROcodone-homatropine (HYCODAN) 5-1.5 MG/5ML syrup, Take 5 mLs by mouth 3 (three) times daily as needed for cough., Disp: 120 mL, Rfl: 0 .  ipratropium (ATROVENT) 0.06 % nasal spray, Place 2 sprays into both nostrils 4  (four) times daily as needed for rhinitis., Disp: 15 mL, Rfl: 0 .  NON FORMULARY, Take 1 capsule by mouth daily., Disp: , Rfl:  .  ondansetron (ZOFRAN-ODT) 4 MG disintegrating tablet, Take 4 mg by mouth every 8 (eight) hours as needed for nausea/vomiting., Disp: , Rfl:  .  Polyethyl Glycol-Propyl Glycol (SYSTANE) 0.4-0.3 % SOLN, Place 1 drop into both eyes 5 (five) times daily as needed (dry/irritated eyes). , Disp: , Rfl:  .  pyridOXINE (VITAMIN B-6) 100 MG tablet, Take 100 mg by mouth daily., Disp: , Rfl:  .  Selenium 200 MCG CAPS, Take 200 mcg by mouth daily., Disp: , Rfl:  .  sucralfate (CARAFATE) 1 g tablet, 1 tablet 3 (three) times daily as needed. , Disp: , Rfl:  .  vitamin E 400 UNIT capsule, Take 400 Units by mouth daily., Disp: , Rfl:  .  Zoledronic Acid (RECLAST IV), Inject 1 Dose into the vein as directed. ONCE A YEAR, Disp: , Rfl:  .  meclizine (ANTIVERT) 25 MG tablet, Take 1 tablet (25 mg total) by mouth every 6 (six) hours as needed for dizziness. (Patient not taking: Reported on 08/26/2019), Disp: 30 tablet, Rfl: 0 .  montelukast (SINGULAIR) 10 MG tablet, Take 10 mg by mouth at bedtime.  (Patient not taking: Reported on 08/26/2019), Disp: , Rfl:  .  umeclidinium-vilanterol (ANORO ELLIPTA) 62.5-25 MCG/INH AEPB, Inhale 1 puff into the lungs as needed.  (Patient not taking: Reported on 08/26/2019), Disp: , Rfl:   Physical exam:  Vitals:   08/26/19 1050  BP: 117/74  Pulse: 78  Resp: 18  Temp: (!) 97.3 F (36.3 C)  TempSrc: Tympanic  SpO2: 95%  Weight: 161 lb 14.4 oz (73.4 kg)   Physical Exam Constitutional:      General: She is not in acute  distress. Neck:     Comments: There is a small diffuse soft tissue swelling noted just lateral to the midline of the neck on the left side.  There is no palpable mass or superficial infection Cardiovascular:     Rate and Rhythm: Normal rate and regular rhythm.     Heart sounds: Normal heart sounds.  Pulmonary:     Effort: Pulmonary  effort is normal.     Breath sounds: Normal breath sounds.  Abdominal:     General: Bowel sounds are normal.     Palpations: Abdomen is soft.  Skin:    General: Skin is warm and dry.  Neurological:     Mental Status: She is alert and oriented to person, place, and time.      CMP Latest Ref Rng & Units 08/26/2019  Glucose 70 - 99 mg/dL 98  BUN 8 - 23 mg/dL 12  Creatinine 0.44 - 1.00 mg/dL 0.47  Sodium 135 - 145 mmol/L 139  Potassium 3.5 - 5.1 mmol/L 5.0  Chloride 98 - 111 mmol/L 101  CO2 22 - 32 mmol/L 28  Calcium 8.9 - 10.3 mg/dL 9.2  Total Protein 6.5 - 8.1 g/dL 7.0  Total Bilirubin 0.3 - 1.2 mg/dL 0.7  Alkaline Phos 38 - 126 U/L 80  AST 15 - 41 U/L 22  ALT 0 - 44 U/L 16   CBC Latest Ref Rng & Units 08/26/2019  WBC 4.0 - 10.5 K/uL 3.7(L)  Hemoglobin 12.0 - 15.0 g/dL 12.4  Hematocrit 36 - 46 % 37.7  Platelets 150 - 400 K/uL 220      Assessment and plan- Patient is a 74 y.o. female with stage III adenocarcinoma of the lung.   She is here for on treatment assessment prior to cycle 10 of maintenance durvalumab  Counts okay to proceed with cycle 10 of maintenance durvalumab today.  I will see her back in 2 weeks with CBC with differential CMP for cycle 11.  Neck swelling: There is a mild diffuse soft tissue swelling just lateral to the midline of the neck.  Etiology is unclear.  I will proceed with an ultrasound neck at this time.  She will also get in touch with Dr. Richardson Landry for an ENT evaluation   Visit Diagnosis 1. Neck swelling   2. Encounter for antineoplastic immunotherapy   3. Malignant neoplasm of upper lobe of left lung ([place])      Dr. Randa Evens, MD, MPH [facility] at [facility] [phone] 08/27/2019 1:10 PM

## 2019-08-29 ENCOUNTER — Ambulatory Visit
Admission: RE | Admit: 2019-08-29 | Discharge: 2019-08-29 | Disposition: A | Discharge status: Home / Self Care | Payer: Medicare Other | Source: Ambulatory Visit | Attending: Radiation Oncology | Admitting: Radiation Oncology

## 2019-08-29 ENCOUNTER — Other Ambulatory Visit: Payer: Self-pay

## 2019-08-29 VITALS — BP 129/69 | HR 78 | Temp 97.0°F | Resp 20 | Wt 163.6 lb

## 2019-08-29 DIAGNOSIS — Z923 Personal history of irradiation: Secondary | ICD-10-CM | POA: Insufficient documentation

## 2019-08-29 DIAGNOSIS — C3412 Malignant neoplasm of upper lobe, left bronchus or lung: Secondary | ICD-10-CM

## 2019-08-29 DIAGNOSIS — R221 Localized swelling, mass and lump, neck: Secondary | ICD-10-CM | POA: Diagnosis not present

## 2019-08-29 NOTE — Progress Notes (Signed)
Radiation Oncology Follow up Note  Name: Jennifer Hodges   Date:   08/29/2019 MRN:  [id number] DOB: 1945-07-13    This 74 y.o. female presents to the clinic today for 18-month follow-up status post concurrent chemoradiation therapy for stage IIIa (T1b N2 M0) non-small cell lung cancer favoring adenocarcinoma.Marland Kitchen  REFERRING PROVIDER: [name], MD  HPI: Patient is a 74 year old female now out 5 months having completed concurrent chemoradiation for stage IIIa non-small cell lung cancer.  She is seen today in routine follow-up doing fairly well..  She is currently on maintenance durvalumab which she is tolerating well.  She has developed some neck swelling is found to have possible salivary gland stones which is being worked up by ENT and she is currently under treatment with antibiotic therapy and symptom management.  She specifically [place] cough hemoptysis or chest tightness.  She recently had a CT scan back in April showing further decrease in size and previous identified bilateral index pulmonary nodules.  She does have some consolidative opacity in the left lung which is going to be followed.  COMPLICATIONS OF TREATMENT: none  FOLLOW UP COMPLIANCE: keeps appointments   PHYSICAL EXAM:  BP 129/69 (BP Location: Right Wrist, Patient Position: Sitting, Cuff Size: Small)   Pulse 78   Temp (!) 97 F (36.1 C) (Tympanic)   Resp 20   Wt 163 lb 9.6 oz (74.2 kg)   BMI 29.92 kg/m  Well-developed well-nourished patient in NAD. HEENT reveals PERLA, EOMI, discs not visualized.  Oral cavity is clear. No oral mucosal lesions are identified. Neck is clear without evidence of cervical or supraclavicular adenopathy. Lungs are clear to A&P. Cardiac examination is essentially unremarkable with regular rate and rhythm without murmur rub or thrill. Abdomen is benign with no organomegaly or masses noted. Motor sensory and DTR levels are equal and symmetric in the upper and lower extremities. Cranial nerves II  through XII are grossly intact. Proprioception is intact. No peripheral adenopathy or edema is identified. No motor or sensory levels are noted. Crude visual fields are within normal range.  RADIOLOGY RESULTS: CT scan reviewed compatible with above-stated findings  PLAN: Present time patient is doing well continues on maintenance immunotherapy under medical oncology's direction.  He she also is under management and pain control for probable salivary stones.  I am overall pleased with her progress current performance and results by CT criteria.  I have asked to see her back in 6 months for follow-up.  I will then start once year follow-up visits.  Patient knows to call at anytime with any concerns.  I would like to take this opportunity to thank you for allowing me to participate in the care of your patient.Noreene Filbert, MD

## 2019-08-30 ENCOUNTER — Encounter: Payer: Self-pay | Admitting: Physical Therapy

## 2019-08-30 ENCOUNTER — Ambulatory Visit: Payer: Medicare Other | Admitting: Physical Therapy

## 2019-08-30 DIAGNOSIS — R42 Dizziness and giddiness: Secondary | ICD-10-CM | POA: Diagnosis not present

## 2019-08-30 DIAGNOSIS — R2681 Unsteadiness on feet: Secondary | ICD-10-CM

## 2019-08-30 NOTE — Therapy (Addendum)
[place] [facility] [address] [place], [place], [address] Phone: [phone]   Fax:  [phone]  Physical Therapy Treatment/Discharge Summary Dates of Hodges: 07/19/2019-08/30/2019 Total Number of Visits: 7  Patient Details  Name: Jennifer Hodges MRN: [id number] Date of Birth: 1946/02/07 Referring Provider (PT): Dr. Pryor Ochoa   Encounter Date: 08/30/2019   PT End of Session - 08/30/19 1024    Visit Number 7    Number of Visits 9    Date for PT Re-Evaluation 09/13/19    Authorization Type Initial Eval 07/19/19 FOTO    PT Start Time 1014    PT Stop Time 1100    PT Time Calculation (min) 46 min    Equipment Utilized During Treatment Gait belt    Activity Tolerance Patient tolerated treatment well    Behavior During Therapy [facility] for tasks assessed/performed           Past Medical History:  Diagnosis Date  . Anemia   . Asthma    No Inhalers--Dr. [name] will order as needed  . Bronchiectasis (HCC)    mild  . Chronic headaches     followed by [facility] migraines  . COPD (chronic obstructive pulmonary disease) ([place])   . DDD (degenerative disc disease), lumbar   . Diabetes mellitus without complication [facility])    "doctor says I no longer have diabetes"    . Diverticulosis   . Family history of adverse reaction to anesthesia    PONV  . Gall stones    history of  . GERD (gastroesophageal reflux disease)    EGD 8/09- non bleeding erosive gastritis, documentd esophageal ulcerations.   . Hiatal hernia   . History of pneumonia   . Hypercholesterolemia   . IBS (irritable bowel syndrome)   . Malignant neoplasm of upper lobe of left lung ([place]) 12/27/2018  . Murmur   . Osteoarthritis    lumbar disc disease, left hip  . PONV (postoperative nausea and vomiting)    "Only with last hip and I believe it was due to the morphine"   . Sleep apnea    cpap asked to bring mask and tubing  . Vertigo   . Weakness of right side of body     Past  Surgical History:  Procedure Laterality Date  . ABDOMINAL HYSTERECTOMY  age 47  . ANTERIOR CERVICAL DECOMP/DISCECTOMY FUSION N/A 02/24/2015   Procedure: CERVICAL FOUR-FIVE, CERVICAL FIVE-SIX, CERVICAL SIX-SEVEN ANTERIOR CERVICAL DECOMPRESSION/DISCECTOMY FUSION ;  Surgeon: [name], MD;  Location: [facility];  Hodges: Neurosurgery;  Laterality: N/A;  C45 C56 C67 anterior cervical decompression with fusion interbody prosthesis plating and bonegraft  . APPENDECTOMY    . BACK SURGERY     4th lumbar fusion  . BLADDER SURGERY N/A    with vaginal wall repair  . BREAST CYST ASPIRATION Bilateral    neg  . BREAST SURGERY Bilateral    cyst removed and reduction  . CARDIAC CATHETERIZATION  2014  . CARPAL TUNNEL RELEASE Right 02/11/2016   Procedure: CARPAL TUNNEL RELEASE;  Surgeon: [name], MD;  Location: [facility] ORS;  Hodges: Orthopedics;  Laterality: Right;  . CATARACT EXTRACTION W/ INTRAOCULAR LENS IMPLANT Bilateral 2015  . CHOLECYSTECTOMY    . EUS N/A 05/31/2012   Procedure: UPPER ENDOSCOPIC ULTRASOUND (EUS) LINEAR;  Surgeon: [name], MD;  Location: [facility];  Hodges: Endoscopy;  Laterality: N/A;  . EXCISIONAL HEMORRHOIDECTOMY    . JOINT REPLACEMENT Bilateral   . KNEE ARTHROSCOPY WITH  LATERAL MENISECTOMY Right 07/07/2015   Procedure: KNEE ARTHROSCOPY WITH LATERAL MENISECTOMY, PARTIAL SYNOVECTOMY;  Surgeon: [name], MD;  Location: [facility] ORS;  Hodges: Orthopedics;  Laterality: Right;  . LUMBAR LAMINECTOMY    . PORTA CATH INSERTION N/A 01/07/2019   Procedure: PORTA CATH INSERTION;  Surgeon: [name], MD;  Location: [facility] LAB;  Hodges: Cardiovascular;  Laterality: N/A;  . REDUCTION MAMMAPLASTY  1990  . RIGHT OOPHORECTOMY    . TOTAL HIP ARTHROPLASTY Left 05/01/2014   Dr. [name]  . TOTAL HIP ARTHROPLASTY Right 08/05/2014   Procedure: TOTAL HIP ARTHROPLASTY ANTERIOR APPROACH;  Surgeon: [name], MD;  Location: [facility] ORS;  Hodges: Orthopedics;   Laterality: Right;  . ULNAR NERVE TRANSPOSITION Right 02/11/2016   Procedure: ULNAR NERVE DECOMPRESSION/TRANSPOSITION;  Surgeon: [name], MD;  Location: [facility] ORS;  Hodges: Orthopedics;  Laterality: Right;  Marland Kitchen VIDEO BRONCHOSCOPY WITH ENDOBRONCHIAL ULTRASOUND Left 12/19/2018   Procedure: VIDEO BRONCHOSCOPY WITH ENDOBRONCHIAL ULTRASOUND, LEFT, SLEEP APNEA;  Surgeon: [name], MD;  Location: [facility] ORS;  Hodges: Thoracic;  Laterality: Left;    There were no vitals filed for this visit.   Subjective Assessment - 08/30/19 1020    Subjective Patient states that her neck was hurting this past week and she had appointments including an infusion therapy. Patient she states she only got to do her exercises twice. Patient states she is a little fatigued.    Pertinent History History obtained from patient as well as medical record. Patient reports that she has had dizzy spells in the past, but this was more than her usual symptoms. Patient reports she began to have dizziness again last year and then patient began to have symptoms of vertigo, nausea and vomiting in February 2021 when she woke up in the middle of the night with a bad headache and she tried to lift her head off the pillow.  Patient states she was nauseous and could not sleep on her left side at all. Patient reports that she was been seen by Dr. Pryor Ochoa and Dr. 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 Patient received Epley maneuver at ENT office for left BPPV in March. Patient states the treatments helped but it is still there. Patient had VNG study on 06/19/2019 that revealed abnormal smooth pursuits, saccades and all OPK's. 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 Patient reports that Dr. Pryor Ochoa felt that the chemotherapy drugs might have caused the inner ear damage  and balance difficulties. Patient describes her dizziness as vertigo, unsteadiness and lightheadedness. Patient reports that she always has a certain amount of dizziness now and that it is worse at times. Patient reports that bending over, looking up, rolling over especially onto her left side, quick movements all bring on her symptoms and that Meclizine and staying still help to decrease her symptoms some. Patient reports that if she looks down she will fall. Patient reports that she cannot sleep on her left side due to dizziness. Patient reports that she veers usually to the left when she walks. Patient reports that she has not been driving due to her dizziness symptoms. Patient sees Dr. Manuella Ghazi, neurologist. Patient has history of migraines, chronic tension headaches and left sided occipital neuralgia. Patient has family history of migraines and dizziness. Patient reports she always has a dull headache and different things can make it worse such  as noise and bright light. She takes an injection once a month for migrianes and patient states that helps a lot. Patient reports the last full blown migraine that she has had was when she had the brain MRI on 05/08/2019 from the noise of the machine. Patient reports she gets a repeat MRI every 3-4 months. 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    Diagnostic tests MRI brain- normal study for age    Patient Stated Goals decreased dizziness, improved balance, improved walking              [facility] PT Assessment - 08/30/19 1044      Standardized Balance Assessment   Standardized Balance Assessment Dynamic Gait Index      Dynamic Gait Index   Level Surface Mild Impairment    Change in Gait Speed Normal    Gait with Horizontal Head Turns Mild Impairment    Gait with Vertical Head Turns Normal    Gait and Pivot Turn Normal    Step Over Obstacle Mild Impairment    Step Around Obstacles Normal    Steps Moderate Impairment    Total Score 19           Physical Performance: Patient performed the DGI test and scored 19/24.  FUNCTIONAL OUTCOME MEASURES:  Results Comments  DHI 28/100 Low perception of handicap  ABC Scale 45% Falls risk; in need of intervention  DGI 19/24 Falls risk; in need of intervention  FOTO 52/100 in need of intervention    Neuromuscular Re-education:  Airex balance beam:  On Airex balance beam, performed sideways stepping 5' times multiple reps with CGA. On Airex balance beam, performed tandem walking 5' times multiple reps with CGA.  Patient performed ambulation without AD with random head turns 150' reps with CGA.   Patient reports that the home exercise program is helping her ankles, hips, legs and back and helping with her balance. Patient reports if she is having a day where she feels off-balance she will use the [facility] or RW. Patient reports that she moves too quickly sometimes and states she is trying to move a little slower. Patient states that she has been driving, walking in her house and doing all of her housework now. Patient reports she is limited at times due to chronic body pains in hips, back and shoulders.       PT Education - 08/30/19 1246    Education Details discussed outcome measure testing and compared to prior testing; discussed goals and plan of care and discharge plans    Person(s) Educated Patient    Methods Explanation    Comprehension Verbalized understanding            PT Short Term Goals - 08/16/19 1148      PT SHORT TERM GOAL #1   Title Patient will be independent in home exercise program to improve strength/mobility for better functional independence with ADLs and for self-management.    Time 4    Period Weeks    Status Achieved    Target Date 08/16/19             PT Long Term Goals - 08/30/19 1038      PT LONG TERM GOAL #1   Title Patient will reduce falls risk as indicated by Activities Specific Balance Confidence Scale (ABC) >67%.    Baseline scored 13.3% on  07/19/2019; scored 45% on 08/30/19    Time 8    Period Weeks  Status Partially Met      PT LONG TERM GOAL #2   Title Patient will reduce perceived disability to moderate levels as indicated by <70 on Dizziness Handicap Inventory [facility]).    Baseline scored 92/100 on 07/19/19; scored 28/100 on 08/30/19    Time 8    Period Weeks    Status Achieved      PT LONG TERM GOAL #3   Title Patient will have improved functional status as evidenced by an increase of 10 points or more on the FOTO score.    Baseline scored 45/100 on 07/19/19; scored 52/100 on 08/30/19    Time 8    Period Weeks    Status Partially Met                 Plan - 08/30/19 1256    Clinical Impression Statement Patient reports that she has not been having dizziness but states she still has some sensation of feeling off balance. Patient met 1/1 short term goals. Patient met 1/3 long term and partially met 2/3 long term goals. Patient improved from 13% to 45% on the ABC scale and 45/100 to 52/100 on the FOTO survey. Patient improved from 14/24 to 19/24 on the DGI and improved from 92/100 to 28/100 on the [facility] indicating low perception of handicap. Patient has demonstrated good improvements in decreased dizziness and improved balance. Patient reports that she feels ready for discharge and would like to continue to do her home exercise program and walking upon discharge. Will discharge patient from PT services at this time with plan to have patient continue to perform HEP.    Personal Factors and Comorbidities Past/Current Experience;Comorbidity 3+;Time since onset of injury/illness/exacerbation    Comorbidities intractable migraine, left sided occipital neuralgia, COPD, sleep apnea, lung CA, chronic tension headaches    Examination-Activity Limitations Bed Mobility;Bend;Reach Overhead;Locomotion Level;Stairs    Examination-Participation Restrictions Cleaning;Driving;Yard Work    Conservation officer, historic buildings Evolving/Moderate  complexity    Rehab Potential Fair    PT Frequency 1x / week    PT Duration 8 weeks    PT Treatment/Interventions Canalith Repostioning;Gait training;Stair training;Therapeutic activities;Therapeutic exercise;Balance training;Neuromuscular re-education;Vestibular;Patient/family education    Consulted and Agree with Plan of Care Patient           Patient will benefit from skilled therapeutic intervention in order to improve the following deficits and impairments:  Decreased balance, Difficulty walking, Dizziness  Visit Diagnosis: Dizziness and giddiness  Unsteadiness on feet     Problem List Patient Active Problem List   Diagnosis Date Noted  . Benign breast lumps 01/11/2019  . Gout 01/11/2019  . Hives 01/11/2019  . Lung disease 01/11/2019  . Migraine headache 01/11/2019  . Goals of care, counseling/discussion 12/27/2018  . 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 Night sweats 08/07/2016  . 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 Osteopenia of multiple sites 04/27/2016  . Left carpal tunnel syndrome 02/02/2016  . Carotid stenosis, asymptomatic, bilateral 12/29/2015  . Prediabetes 09/28/2015  . Primary osteoarthritis involving multiple joints 09/28/2015  . Knee pain 05/11/2015  . Chest tightness 03/24/2015  . Stool incontinence 03/24/2015  . Urinary frequency 03/04/2015  . Cervical spondylosis with radiculopathy 02/24/2015  . Pre-syncope 02/19/2015  . URI (upper respiratory infection) 02/15/2015  . Neck pain 12/25/2014  . Pelvic pain in female 11/16/2014  . Primary osteoarthritis of hip 08/05/2014  . Heel pain 06/01/2014  . Diarrhea 06/01/2014  . Health care maintenance 06/01/2014  . Right leg  pain 03/30/2014  . Right arm pain 03/30/2014  . Sinusitis 03/30/2014  . Internal nasal lesion 11/12/2013  . Other specified disorders of nose and nasal sinuses 11/12/2013  . Chronic tension-type headache, intractable 10/30/2013  . Intractable  migraine without aura and without status migrainosus 10/30/2013  . Occipital neuralgia of left side 10/30/2013  . IBS (irritable bowel syndrome) 09/03/2013  . Rib pain 08/04/2013  . Cervical radiculitis 08/01/2013  . Lumbar radiculitis 08/01/2013  . Palpitations 07/25/2013  . Benign essential hypertension 05/28/2013  . Neuralgia, neuritis, and radiculitis, unspecified 05/28/2013  . Vitamin D deficiency 05/28/2013  . Elevated AFP 04/28/2013  . Abnormality of alpha-fetoprotein 04/28/2013  . Osteoporosis 11/12/2012  . Incomplete emptying of bladder 07/11/2012  . Prolapse of vaginal vault after hysterectomy 07/11/2012  . Nonspecific (abnormal) findings on radiological and other examination of gastrointestinal tract 05/31/2012  . Bronchiectasis ([place]) 02/05/2012  . Sleep apnea 02/05/2012  . Degenerative disc disease, cervical 02/05/2012  . Hypercholesterolemia 02/05/2012  . GERD (gastroesophageal reflux disease) 02/05/2012  . Chronic headaches 02/05/2012   Lady Deutscher PT, DPT [phone] Lady Deutscher 08/30/2019, 1:08 PM  [facility] [address] [place], [place], [address] Phone: [phone]   Fax:  [phone]  Name: Jennifer Hodges MRN: [id number] Date of Birth: Jul 17, 1945

## 2019-09-05 ENCOUNTER — Other Ambulatory Visit: Payer: Self-pay

## 2019-09-05 ENCOUNTER — Ambulatory Visit
Admission: RE | Admit: 2019-09-05 | Discharge: 2019-09-05 | Disposition: A | Discharge status: Home / Self Care | Payer: Medicare Other | Source: Ambulatory Visit | Attending: Oncology | Admitting: Oncology

## 2019-09-05 DIAGNOSIS — C3412 Malignant neoplasm of upper lobe, left bronchus or lung: Secondary | ICD-10-CM | POA: Diagnosis present

## 2019-09-05 DIAGNOSIS — Z5112 Encounter for antineoplastic immunotherapy: Secondary | ICD-10-CM | POA: Insufficient documentation

## 2019-09-10 ENCOUNTER — Encounter: Payer: Self-pay | Admitting: Oncology

## 2019-09-10 ENCOUNTER — Inpatient Hospital Stay: Payer: Medicare Other

## 2019-09-10 ENCOUNTER — Inpatient Hospital Stay (HOSPITAL_BASED_OUTPATIENT_CLINIC_OR_DEPARTMENT_OTHER): Payer: Medicare Other | Admitting: Oncology

## 2019-09-10 ENCOUNTER — Other Ambulatory Visit: Payer: Self-pay

## 2019-09-10 VITALS — BP 121/67 | HR 67 | Temp 98.1°F | Resp 18 | Wt 158.9 lb

## 2019-09-10 DIAGNOSIS — C3412 Malignant neoplasm of upper lobe, left bronchus or lung: Secondary | ICD-10-CM

## 2019-09-10 DIAGNOSIS — R42 Dizziness and giddiness: Secondary | ICD-10-CM | POA: Diagnosis not present

## 2019-09-10 DIAGNOSIS — K1230 Oral mucositis (ulcerative), unspecified: Secondary | ICD-10-CM

## 2019-09-10 DIAGNOSIS — Z5112 Encounter for antineoplastic immunotherapy: Secondary | ICD-10-CM

## 2019-09-10 LAB — CBC WITH DIFFERENTIAL/PLATELET
Abs Immature Granulocytes: 0.04 10*3/uL (ref 0.00–0.07)
Basophils Absolute: 0 10*3/uL (ref 0.0–0.1)
Basophils Relative: 0 %
Eosinophils Absolute: 0.1 10*3/uL (ref 0.0–0.5)
Eosinophils Relative: 2 %
HCT: 36.1 % (ref 36.0–46.0)
Hemoglobin: 12.1 g/dL (ref 12.0–15.0)
Immature Granulocytes: 1 %
Lymphocytes Relative: 12 %
Lymphs Abs: 0.7 10*3/uL (ref 0.7–4.0)
MCH: 30.6 pg (ref 26.0–34.0)
MCHC: 33.5 g/dL (ref 30.0–36.0)
MCV: 91.4 fL (ref 80.0–100.0)
Monocytes Absolute: 0.4 10*3/uL (ref 0.1–1.0)
Monocytes Relative: 7 %
Neutro Abs: 4.4 10*3/uL (ref 1.7–7.7)
Neutrophils Relative %: 78 %
Platelets: 226 10*3/uL (ref 150–400)
RBC: 3.95 MIL/uL (ref 3.87–5.11)
RDW: 13.1 % (ref 11.5–15.5)
WBC: 5.6 10*3/uL (ref 4.0–10.5)
nRBC: 0 % (ref 0.0–0.2)

## 2019-09-10 LAB — COMPREHENSIVE METABOLIC PANEL
ALT: 18 U/L (ref 0–44)
AST: 18 U/L (ref 15–41)
Albumin: 4 g/dL (ref 3.5–5.0)
Alkaline Phosphatase: 75 U/L (ref 38–126)
Anion gap: 7 (ref 5–15)
BUN: 13 mg/dL (ref 8–23)
CO2: 28 mmol/L (ref 22–32)
Calcium: 9 mg/dL (ref 8.9–10.3)
Chloride: 102 mmol/L (ref 98–111)
Creatinine, Ser: 0.47 mg/dL (ref 0.44–1.00)
GFR calc Af Amer: >60 mL/min (ref >60)
GFR calc non Af Amer: >60 mL/min (ref >60)
Glucose, Bld: 97 mg/dL (ref 70–99)
Potassium: 4.2 mmol/L (ref 3.5–5.1)
Sodium: 137 mmol/L (ref 135–145)
Total Bilirubin: 0.7 mg/dL (ref 0.3–1.2)
Total Protein: 6.8 g/dL (ref 6.5–8.1)

## 2019-09-10 MED ORDER — HEPARIN SOD (PORK) LOCK FLUSH 100 UNIT/ML IV SOLN
INTRAVENOUS | Status: AC
  Filled 2019-09-10: qty 5

## 2019-09-10 MED ORDER — HEPARIN SOD (PORK) LOCK FLUSH 100 UNIT/ML IV SOLN
500.0000 [IU] | Freq: Once | INTRAVENOUS | Status: AC | PRN
  Administered 2019-09-10: 500 [IU]
  Filled 2019-09-10: qty 5

## 2019-09-10 MED ORDER — SODIUM CHLORIDE 0.9 % IV SOLN
Freq: Once | INTRAVENOUS | Status: AC
  Filled 2019-09-10: qty 250

## 2019-09-10 MED ORDER — SODIUM CHLORIDE 0.9 % IV SOLN
10.0000 mg/kg | Freq: Once | INTRAVENOUS | Status: AC
  Administered 2019-09-10: 740 mg via INTRAVENOUS
  Filled 2019-09-10: qty 10

## 2019-09-10 NOTE — Progress Notes (Signed)
Patient here for oncology follow-up appointment, expresses concerns of tongue burning and consistent neck swelling, with fatigue.

## 2019-09-10 NOTE — Addendum Note (Signed)
Addended by: Randa Evens C on: 09/10/2019 10:46 AM   Modules accepted: Orders

## 2019-09-10 NOTE — Progress Notes (Signed)
Hematology/Oncology Consult note [facility]  Telephone:([phone] [phone]  Patient Care Team: [name], MD as PCP - General (Family Medicine) Telford Nab, RN as Registered Nurse   Name of the patient: Jennifer Hodges  [id number]  02-05-1946   Date of visit: 09/10/19  Diagnosis- Adenocarcinoma of the lung stage III T1b N2 M0  Chief complaint/ Reason for visit-on treatment assessment prior to cycle 11 of maintenance durvalumab  Heme/Onc history: patient is a 74 year old female who was seen by pulmonary and ENT for ongoing symptoms of bronchitis and possible laryngitis and received antibiotics for the same.She underwent CT chest in September 2020 which showed multiple bilateral lung nodules with associated mediastinal adenopathy and possible primary left upper lobe lung mass. This was followed by a PET CT scan which showed a left upper lobe lung mass measuring 1.9 x 1.2 cm with an SUV of 10.7. Conglomerate AP window adenopathy measuring 5.8 x 2.1 cm with an SUV of 14. She was also noted to have hypermetabolic left hilar adenopathy and left supraclavicular 0.7 cm lymph node with an SUV of 4.6. Noted to have a 7 mm right lower lobe lung nodule with faint metabolic activity. No evidence of other distant metastatic disease. This was followed by a CT super D chest without contrast. The right lower lobe lung nodules were somewhat larger as compared to September 2020. 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  Maintenance durvalumab started in January 2021 after scan showed partial response.   Interval history-patient still reports swelling in the left side of her neck.  Reports burning sensation in her tongue and some pain with swallowing  ECOG PS- 1 Pain scale- 6 Opioid associated constipation- no  Review of systems- Review of Systems  Constitutional: Negative for chills, fever, malaise/fatigue and weight loss.  HENT: Negative for congestion, ear discharge and nosebleeds.        Burning sensation in the tongue  Eyes: Negative for blurred vision.  Respiratory: Negative for cough, hemoptysis, sputum production, shortness of breath and wheezing.   Cardiovascular: Negative for chest pain, palpitations, orthopnea and claudication.  Gastrointestinal: Negative for abdominal pain, blood in stool, constipation, diarrhea, heartburn, melena, nausea and vomiting.       Pain during swallowing and left neck swelling  Genitourinary: Negative for dysuria, flank pain, frequency, hematuria and urgency.  Musculoskeletal: Negative for back pain, joint pain and myalgias.  Skin: Negative for rash.  Neurological: Negative for dizziness, tingling, focal weakness, seizures, weakness and headaches.  Endo/Heme/Allergies: Does not bruise/bleed easily.  Psychiatric/Behavioral: Negative for depression and suicidal ideas. The patient does not have insomnia.      Allergies  Allergen Reactions  . Aspirin Hives and Other (See Comments)    Difficulty breathing  . Celebrex [Celecoxib] Shortness Of Breath  . Morphine And Related Nausea And Vomiting and Swelling  . Adhesive [Tape] Other (See Comments)    Took top layer of skin off when removed.  . Clarithromycin Nausea And Vomiting  . Codeine Nausea And Vomiting  . Darvon [Propoxyphene Hcl] Nausea And Vomiting  . Demerol [Meperidine] Nausea And Vomiting  . Flonase [  Fluticasone Propionate] Other (See Comments)    Fungal infection    . Simvastatin Other (See Comments)    "caused ulcers in mouth, and fever"  . Talwin [Pentazocine] Nausea And Vomiting     Past Medical History:  Diagnosis Date  . Anemia   . Asthma    No Inhalers--Dr. [name] will order as needed  . Bronchiectasis (HCC)    mild  . Chronic headaches     followed by [facility] migraines  . COPD (chronic obstructive pulmonary disease) ([place])   . DDD (degenerative disc disease), lumbar   . Diabetes mellitus without complication [facility])    "doctor says I no longer have diabetes"    . Diverticulosis   . Family history of adverse reaction to anesthesia    PONV  . Gall stones    history of  . GERD (gastroesophageal reflux disease)    EGD 8/09- non bleeding erosive gastritis, documentd esophageal ulcerations.   . Hiatal hernia   . History of pneumonia   . Hypercholesterolemia   . IBS (irritable bowel syndrome)   . Malignant neoplasm of upper lobe of left lung ([place]) 12/27/2018  . Murmur   . Osteoarthritis    lumbar disc disease, left hip  . PONV (postoperative nausea and vomiting)    "Only with last hip and I believe it was due to the morphine"   . Sleep apnea    cpap asked to bring mask and tubing  . Vertigo   . Weakness of right side of body      Past Surgical History:  Procedure Laterality Date  . ABDOMINAL HYSTERECTOMY  age 29  . ANTERIOR CERVICAL DECOMP/DISCECTOMY FUSION N/A 02/24/2015   Procedure: CERVICAL FOUR-FIVE, CERVICAL FIVE-SIX, CERVICAL SIX-SEVEN ANTERIOR CERVICAL DECOMPRESSION/DISCECTOMY FUSION ;  Surgeon: [name], MD;  Location: [place] [facility];  Service: Neurosurgery;  Laterality: N/A;  C45 C56 C67 anterior cervical decompression with fusion interbody prosthesis plating and bonegraft  . APPENDECTOMY    . BACK SURGERY     4th lumbar fusion  . BLADDER SURGERY N/A    with vaginal wall repair  . BREAST CYST ASPIRATION Bilateral    neg  . BREAST SURGERY Bilateral    cyst removed and reduction  . CARDIAC  CATHETERIZATION  2014  . CARPAL TUNNEL RELEASE Right 02/11/2016   Procedure: CARPAL TUNNEL RELEASE;  Surgeon: [name], MD;  Location: [facility] ORS;  Service: Orthopedics;  Laterality: Right;  . CATARACT EXTRACTION W/ INTRAOCULAR LENS IMPLANT Bilateral 2015  . CHOLECYSTECTOMY    . EUS N/A 05/31/2012   Procedure: UPPER ENDOSCOPIC ULTRASOUND (EUS) LINEAR;  Surgeon: [name], MD;  Location: [facility];  Service: Endoscopy;  Laterality: N/A;  . EXCISIONAL HEMORRHOIDECTOMY    . JOINT REPLACEMENT Bilateral   . KNEE ARTHROSCOPY WITH LATERAL MENISECTOMY Right 07/07/2015   Procedure: KNEE ARTHROSCOPY WITH LATERAL MENISECTOMY, PARTIAL SYNOVECTOMY;  Surgeon: [name], MD;  Location: [facility] OR[facility];  Service: Orthopedics;  Laterality: Right;  . LUMBAR LAMINECTOMY    . PORTA CATH INSERTION N/A 01/07/2019   Procedure: PORTA CATH INSERTION;  Surgeon: [name], MD;  Location: [place] [facility];  Service: Cardiovascular;  Laterality: N/A;  . REDUCTION MAMMAPLASTY  1990  . RIGHT OOPHORECTOMY    . TOTAL HIP ARTHROPLASTY Left 05/01/2014   Dr. [name]  . TOTAL HIP ARTHROPLASTY Right 08/05/2014   Procedure: TOTAL HIP ARTHROPLASTY ANTERIOR APPROACH;  Surgeon: [name], MD;  Location: [facility] OR[facility];  Service: Orthopedics;  Laterality: Right;  .  ULNAR NERVE TRANSPOSITION Right 02/11/2016   Procedure: ULNAR NERVE DECOMPRESSION/TRANSPOSITION;  Surgeon: [name], MD;  Location: [facility] OR[facility];  Service: Orthopedics;  Laterality: Right;  Marland Kitchen VIDEO BRONCHOSCOPY WITH ENDOBRONCHIAL ULTRASOUND Left 12/19/2018   Procedure: VIDEO BRONCHOSCOPY WITH ENDOBRONCHIAL ULTRASOUND, LEFT, SLEEP APNEA;  Surgeon: [name], MD;  Location: [facility] ORS;  Service: Thoracic;  Laterality: Left;    Social History   Socioeconomic History  . Marital status: Married    Spouse name: Not on file  . Number of children: Not on file  . Years of education: Not on file  . Highest education level: Not on file  Occupational History    . Not on file  Tobacco Use  . Smoking status: Former Smoker    Quit date: 06/13/2007    Years since quitting: 12.2  . Smokeless tobacco: Never Used  Vaping Use  . Vaping Use: Never used  Substance and Sexual Activity  . Alcohol use: No    Alcohol/week: 0.0 standard drinks  . Drug use: No  . Sexual activity: Never  Other Topics Concern  . Not on file  Social History Narrative  . Not on file   Social Determinants of Health   Financial Resource Strain:   . Difficulty of Paying Living Expenses:   Food Insecurity:   . Worried About Charity fundraiser in the Last Year:   . Arboriculturist in the Last Year:   Transportation Needs:   . Film/video editor (Medical):   Marland Kitchen Lack of Transportation (Non-Medical):   Physical Activity:   . Days of Exercise per Week:   . Minutes of Exercise per Session:   Stress:   . Feeling of Stress :   Social Connections:   . Frequency of Communication with Friends and Family:   . Frequency of Social Gatherings with Friends and Family:   . Attends Religious Services:   . Active Member of Clubs or Organizations:   . Attends Archivist Meetings:   Marland Kitchen Marital Status:   Intimate Partner Violence:   . Fear of Current or Ex-Partner:   . Emotionally Abused:   Marland Kitchen Physically Abused:   . Sexually Abused:     Family History  Problem Relation Age of Onset  . Heart disease Mother        s/p stent  . Hypertension Mother   . Hypercholesterolemia Mother   . Diabetes Father   . Stomach cancer Other        uncle  . Breast cancer Cousin   . Breast cancer Paternal Aunt      Current Outpatient Medications:  .  albuterol (PROVENTIL) (2.5 MG/3ML) 0.083% nebulizer solution, Take 2.5 mg by nebulization every 6 (six) hours as needed for wheezing., Disp: , Rfl:  .  albuterol (VENTOLIN HFA) 108 (90 Base) MCG/ACT inhaler, Inhale 2 puffs into the lungs every 6 (six) hours as needed for wheezing or shortness of breath., Disp: 18 g, Rfl: 0 .   Bioflavonoid Products (ESTER C PO), Take 1 tablet by mouth 3 (three) times daily., Disp: , Rfl:  .  Calcium-Magnesium-Zinc (CAL-MAG-ZINC PO), Take 1 tablet by mouth daily., Disp: , Rfl:  .  cetirizine (ZYRTEC) 5 MG tablet, Take 1 tablet (5 mg total) by mouth daily. (Patient taking differently: Take 5 mg by mouth daily as needed (runny nose.). ), Disp: 30 tablet, Rfl: 0 .  CHLORASEPTIC MAX SORE THROAT 1.5-33 % LIQD, Use as directed 1 spray in the mouth or throat as  needed (throat irritation.)., Disp: , Rfl:  .  Cholecalciferol (EQL VITAMIN D3) 25 MCG (1000 UT) capsule, Take 4,000 Units by mouth daily., Disp: , Rfl:  .  CHROMIUM PO, Take 1 capsule by mouth daily., Disp: , Rfl:  .  Coenzyme Q10 (CO Q 10) 100 MG CAPS, Take 100 mg by mouth daily. , Disp: , Rfl:  .  Cyanocobalamin (VITAMIN B-12) 2500 MCG SUBL, Place 2,500 mcg under the tongue daily., Disp: , Rfl:  .  cyclobenzaprine (FLEXERIL) 10 MG tablet, Take 10 mg by mouth at bedtime. , Disp: , Rfl:  .  DHEA 25 MG CAPS, Take 25 mg by mouth daily., Disp: , Rfl:  .  Ensure (ENSURE), Take 237 mLs by mouth 2 (two) times daily between meals., Disp: , Rfl:  .  Erenumab-aooe (AIMOVIG) 140 MG/ML SOAJ, Aimovig Autoinjector 140 mg/mL subcutaneous auto-injector, Disp: , Rfl:  .  ezetimibe (ZETIA) 10 MG tablet, Take 10 mg by mouth at bedtime., Disp: , Rfl:  .  folic acid (FOLVITE) 492 MCG tablet, Take 400 mcg by mouth 2 (two) times daily. , Disp: , Rfl:  .  Garlic 0100 MG CAPS, Take 1,000 mg by mouth daily., Disp: , Rfl:  .  Histamine Dihydrochloride (AUSTRALIAN DREAM ARTHRITIS EX), Apply 1 application topically 4 (four) times daily as needed (pain.)., Disp: , Rfl:  .  HYDROcodone-acetaminophen (NORCO/VICODIN) 5-325 MG tablet, 1-2 tabs po bid prn (Patient taking differently: Take 1 tablet by mouth at bedtime. ), Disp: 10 tablet, Rfl: 0 .  HYDROcodone-homatropine (HYCODAN) 5-1.5 MG/5ML syrup, Take 5 mLs by mouth 3 (three) times daily as needed for cough., Disp:  120 mL, Rfl: 0 .  ipratropium (ATROVENT) 0.06 % nasal spray, Place 2 sprays into both nostrils 4 (four) times daily as needed for rhinitis., Disp: 15 mL, Rfl: 0 .  lidocaine-prilocaine (EMLA) cream, lidocaine-prilocaine 2.5 %-2.5 % topical cream, Disp: , Rfl:  .  montelukast (SINGULAIR) 10 MG tablet, Take 10 mg by mouth at bedtime. , Disp: , Rfl:  .  NON FORMULARY, Take 1 capsule by mouth daily., Disp: , Rfl:  .  ondansetron (ZOFRAN-ODT) 4 MG disintegrating tablet, Take 4 mg by mouth every 8 (eight) hours as needed for nausea/vomiting., Disp: , Rfl:  .  Polyethyl Glycol-Propyl Glycol (SYSTANE) 0.4-0.3 % SOLN, Place 1 drop into both eyes 5 (five) times daily as needed (dry/irritated eyes). , Disp: , Rfl:  .  pyridOXINE (VITAMIN B-6) 100 MG tablet, Take 100 mg by mouth daily., Disp: , Rfl:  .  Selenium 200 MCG CAPS, Take 200 mcg by mouth daily., Disp: , Rfl:  .  sucralfate (CARAFATE) 1 g tablet, 1 tablet 3 (three) times daily as needed. , Disp: , Rfl:  .  umeclidinium-vilanterol (ANORO ELLIPTA) 62.5-25 MCG/INH AEPB, Inhale 1 puff into the lungs as needed. , Disp: , Rfl:  .  vitamin E 400 UNIT capsule, Take 400 Units by mouth daily., Disp: , Rfl:  .  Zoledronic Acid (RECLAST IV), Inject 1 Dose into the vein as directed. ONCE A YEAR, Disp: , Rfl:  .  meclizine (ANTIVERT) 25 MG tablet, Take 1 tablet (25 mg total) by mouth every 6 (six) hours as needed for dizziness. (Patient not taking: Reported on 09/10/2019), Disp: 30 tablet, Rfl: 0  Physical exam:  Vitals:   09/10/19 1022  BP: 121/67  Pulse: 67  Resp: 18  Temp: 98.1 F (36.7 C)  TempSrc: Oral  SpO2: 100%  Weight: 158 lb 14.4 oz (72.1 kg)  Physical Exam HENT:     Head: Normocephalic and atraumatic.     Mouth/Throat:     Comments: No evidence of thrush.  Mild redness noted over the tongue and possible mucositis over the inner aspect of the buccal mucosa Cardiovascular:     Rate and Rhythm: Normal rate and regular rhythm.     Heart  sounds: Normal heart sounds.  Pulmonary:     Effort: Pulmonary effort is normal.     Breath sounds: Normal breath sounds.  Lymphadenopathy:     Comments: No palpable left neck mass or cervical adenopathy  Skin:    General: Skin is warm and dry.  Neurological:     Mental Status: She is alert and oriented to person, place, and time.      CMP Latest Ref Rng & Units 09/10/2019  Glucose 70 - 99 mg/dL 97  BUN 8 - 23 mg/dL 13  Creatinine 0.44 - 1.00 mg/dL 0.47  Sodium 135 - 145 mmol/L 137  Potassium 3.5 - 5.1 mmol/L 4.2  Chloride 98 - 111 mmol/L 102  CO2 22 - 32 mmol/L 28  Calcium 8.9 - 10.3 mg/dL 9.0  Total Protein 6.5 - 8.1 g/dL 6.8  Total Bilirubin 0.3 - 1.2 mg/dL 0.7  Alkaline Phos 38 - 126 U/L 75  AST 15 - 41 U/L 18  ALT 0 - 44 U/L 18   CBC Latest Ref Rng & Units 09/10/2019  WBC 4.0 - 10.5 K/uL 5.6  Hemoglobin 12.0 - 15.0 g/dL 12.1  Hematocrit 36 - 46 % 36.1  Platelets 150 - 400 K/uL 226    No images are attached to the encounter.  US Soft Tissue Head/Neck  Result Date: 09/06/2019 CLINICAL DATA:  Left lateral neck soft tissue swelling EXAM: ULTRASOUND OF HEAD/NECK SOFT TISSUES TECHNIQUE: Ultrasound examination of the head and neck soft tissues was performed in the area of clinical concern. COMPARISON:  07/02/2019 CT FINDINGS: Superficial soft tissue ultrasound performed of the left lateral neck area of concern. No significant underlying soft tissue mass, cyst, bulky adenopathy, fluid collection, or abscess. Small benign lymph nodes noted. IMPRESSION: No significant left lateral neck soft tissue abnormality by ultrasound. Electronically Signed   By: Jerilynn Mages.  Shick M.D.   On: 09/06/2019 09:29     Assessment and plan- Patient is a 74 y.o. female with stage III adenocarcinoma of the lung.    She is here for on treatment assessment prior to cycle 11 of maintenance durvalumab  Counts okay to proceed with cycle 11 of maintenance durvalumab today.  I will see her back in 2 weeks for  cycle 12.  We are monitoring her TSH every 3 months and last one from May 2021 was normal.  Plan to get repeat scans after next cycle.  Vertigo: Likely secondary to inner ear pathology.  She follows up with ENT and neurology.  She is on as needed meclizine  Neck swelling: Ultrasound showed no abnormality.  She was seen by Dr. Richardson Landry from ENT and he may consider doing ultrasound of the neck since her symptoms persist  Mucositis: Etiology unclear.  It appears mild.  No thrush.  I will give her a prescription for lidocaine swish and swallow.   Visit Diagnosis 1. Encounter for antineoplastic immunotherapy   2. Malignant neoplasm of upper lobe of left lung ([place])   3. Mucositis   4. Vertigo      Dr. Randa Evens, MD, MPH [facility] [phone] 09/10/2019 10:22 AM

## 2019-09-11 ENCOUNTER — Telehealth: Payer: Self-pay | Admitting: Oncology

## 2019-09-11 NOTE — Telephone Encounter (Signed)
Please call her and see when she can come

## 2019-09-11 NOTE — Telephone Encounter (Signed)
Patient phoned on this date and stated that her husband would be having a procedure on 09-25-19 and that she would need to stay home with him and quarantine on 09-24-19. Appts rescheduled from 09-24-19 to 09-26-19.

## 2019-09-11 NOTE — Telephone Encounter (Signed)
Pt called to report that she cannot come to scheduled treatment on 7/13 her husband will need to quarantine due to a procedure he is having so she needs to reschedule.

## 2019-09-19 ENCOUNTER — Telehealth: Payer: Self-pay | Admitting: *Deleted

## 2019-09-19 ENCOUNTER — Other Ambulatory Visit: Payer: Self-pay | Admitting: Otolaryngology

## 2019-09-19 DIAGNOSIS — R070 Pain in throat: Secondary | ICD-10-CM

## 2019-09-19 NOTE — Telephone Encounter (Signed)
Pt wanted to let Dr. Janese Banks know that Dr. Pryor Ochoa has order for her to have CT soft tissue neck when she is scheduled to have CT chest, abd, and pelvis on 7/27.

## 2019-09-19 NOTE — Telephone Encounter (Signed)
Ok to do that 

## 2019-09-24 ENCOUNTER — Ambulatory Visit: Payer: Medicare Other | Admitting: Oncology

## 2019-09-24 ENCOUNTER — Ambulatory Visit: Payer: Medicare Other

## 2019-09-24 ENCOUNTER — Other Ambulatory Visit: Payer: Medicare Other

## 2019-09-26 ENCOUNTER — Inpatient Hospital Stay: Payer: Medicare Other | Attending: Oncology

## 2019-09-26 ENCOUNTER — Inpatient Hospital Stay: Payer: Medicare Other

## 2019-09-26 ENCOUNTER — Encounter: Payer: Self-pay | Admitting: Oncology

## 2019-09-26 ENCOUNTER — Other Ambulatory Visit: Payer: Self-pay

## 2019-09-26 ENCOUNTER — Inpatient Hospital Stay (HOSPITAL_BASED_OUTPATIENT_CLINIC_OR_DEPARTMENT_OTHER): Payer: Medicare Other | Admitting: Oncology

## 2019-09-26 VITALS — BP 122/56 | HR 73 | Temp 96.3°F | Resp 18 | Wt 158.5 lb

## 2019-09-26 DIAGNOSIS — R490 Dysphonia: Secondary | ICD-10-CM | POA: Diagnosis not present

## 2019-09-26 DIAGNOSIS — Z5112 Encounter for antineoplastic immunotherapy: Secondary | ICD-10-CM

## 2019-09-26 DIAGNOSIS — C3412 Malignant neoplasm of upper lobe, left bronchus or lung: Secondary | ICD-10-CM

## 2019-09-26 DIAGNOSIS — Z79899 Other long term (current) drug therapy: Secondary | ICD-10-CM | POA: Insufficient documentation

## 2019-09-26 DIAGNOSIS — M4722 Other spondylosis with radiculopathy, cervical region: Secondary | ICD-10-CM

## 2019-09-26 LAB — CBC WITH DIFFERENTIAL/PLATELET
Abs Immature Granulocytes: 0.01 10*3/uL (ref 0.00–0.07)
Basophils Absolute: 0 10*3/uL (ref 0.0–0.1)
Basophils Relative: 0 %
Eosinophils Absolute: 0.1 10*3/uL (ref 0.0–0.5)
Eosinophils Relative: 2 %
HCT: 36.5 % (ref 36.0–46.0)
Hemoglobin: 12.2 g/dL (ref 12.0–15.0)
Immature Granulocytes: 0 %
Lymphocytes Relative: 17 %
Lymphs Abs: 0.9 10*3/uL (ref 0.7–4.0)
MCH: 30.6 pg (ref 26.0–34.0)
MCHC: 33.4 g/dL (ref 30.0–36.0)
MCV: 91.5 fL (ref 80.0–100.0)
Monocytes Absolute: 0.4 10*3/uL (ref 0.1–1.0)
Monocytes Relative: 8 %
Neutro Abs: 3.8 10*3/uL (ref 1.7–7.7)
Neutrophils Relative %: 73 %
Platelets: 225 10*3/uL (ref 150–400)
RBC: 3.99 MIL/uL (ref 3.87–5.11)
RDW: 13.1 % (ref 11.5–15.5)
WBC: 5.2 10*3/uL (ref 4.0–10.5)
nRBC: 0 % (ref 0.0–0.2)

## 2019-09-26 LAB — COMPREHENSIVE METABOLIC PANEL
ALT: 24 U/L (ref 0–44)
AST: 20 U/L (ref 15–41)
Albumin: 3.8 g/dL (ref 3.5–5.0)
Alkaline Phosphatase: 67 U/L (ref 38–126)
Anion gap: 8 (ref 5–15)
BUN: 12 mg/dL (ref 8–23)
CO2: 28 mmol/L (ref 22–32)
Calcium: 8.5 mg/dL — ABNORMAL LOW (ref 8.9–10.3)
Chloride: 101 mmol/L (ref 98–111)
Creatinine, Ser: 0.57 mg/dL (ref 0.44–1.00)
GFR calc Af Amer: >60 mL/min (ref >60)
GFR calc non Af Amer: >60 mL/min (ref >60)
Glucose, Bld: 87 mg/dL (ref 70–99)
Potassium: 4 mmol/L (ref 3.5–5.1)
Sodium: 137 mmol/L (ref 135–145)
Total Bilirubin: 0.5 mg/dL (ref 0.3–1.2)
Total Protein: 6.5 g/dL (ref 6.5–8.1)

## 2019-09-26 MED ORDER — SODIUM CHLORIDE 0.9 % IV SOLN
Freq: Once | INTRAVENOUS | Status: AC
  Filled 2019-09-26: qty 250

## 2019-09-26 MED ORDER — SODIUM CHLORIDE 0.9% FLUSH
10.0000 mL | Freq: Once | INTRAVENOUS | Status: AC
  Administered 2019-09-26: 10 mL via INTRAVENOUS
  Filled 2019-09-26: qty 10

## 2019-09-26 MED ORDER — HEPARIN SOD (PORK) LOCK FLUSH 100 UNIT/ML IV SOLN
INTRAVENOUS | Status: AC
  Filled 2019-09-26: qty 5

## 2019-09-26 MED ORDER — HEPARIN SOD (PORK) LOCK FLUSH 100 UNIT/ML IV SOLN
500.0000 [IU] | Freq: Once | INTRAVENOUS | Status: AC | PRN
  Administered 2019-09-26: 500 [IU]
  Filled 2019-09-26: qty 5

## 2019-09-26 MED ORDER — SODIUM CHLORIDE 0.9 % IV SOLN
10.0000 mg/kg | Freq: Once | INTRAVENOUS | Status: AC
  Administered 2019-09-26: 740 mg via INTRAVENOUS
  Filled 2019-09-26: qty 10

## 2019-09-26 NOTE — Progress Notes (Signed)
Hematology/Oncology Consult note [facility]  [phone] [phone]  Patient Care Team: [name], MD as PCP - General (Family Medicine) Telford Nab, RN as Registered Nurse   Name of the patient: Jennifer Hodges  [id number]  July 05, 1945   Date of visit: 09/26/19  Diagnosis- Adenocarcinoma of the lung stage III T1b N2 M0   Chief complaint/ Reason for visit-on treatment assessment prior to cycle 12 of maintenance durvalumab  Heme/Onc history: patient is a 74 year old female who was seen by pulmonary and ENT for ongoing symptoms of bronchitis and possible laryngitis and received antibiotics for the same.She underwent CT chest in September 2020 which showed multiple bilateral lung nodules with associated mediastinal adenopathy and possible primary left upper lobe lung mass. This was followed by a PET CT scan which showed a left upper lobe lung mass measuring 1.9 x 1.2 cm with an SUV of 10.7. Conglomerate AP window adenopathy measuring 5.8 x 2.1 cm with an SUV of 14. She was also noted to have hypermetabolic left hilar adenopathy and left supraclavicular 0.7 cm lymph node with an SUV of 4.6. Noted to have a 7 mm right lower lobe lung nodule with faint metabolic activity. No evidence of other distant metastatic disease. This was followed by a CT super D chest without contrast. The right lower lobe lung nodules were somewhat larger as compared to September 2020. 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  Maintenance durvalumab started in January 2021 after scan showed partial response.   Interval history-patient has been having some cough with productive sputum.  Denies any fever.  She has been having cough on and off in the past as well.  She also sees ENT and recently saw them for possible neck swelling.  She is getting a CT neck for that next week.  She was also given a course of amoxicillin which she recently completed.  Other than that she is doing well and denies other complaints at this time  ECOG PS- 1 Pain scale- 0 Opioid associated constipation- no  Review of systems- Review of Systems  Constitutional: Negative for chills, fever, malaise/fatigue and weight loss.  HENT: Negative for congestion, ear discharge and nosebleeds.   Eyes: Negative for blurred vision.  Respiratory: Positive for cough and sputum production. Negative for hemoptysis, shortness of breath and wheezing.   Cardiovascular: Negative for chest pain, palpitations, orthopnea and claudication.  Gastrointestinal: Negative for abdominal pain, blood in stool, constipation, diarrhea, heartburn, melena, nausea and vomiting.  Genitourinary: Negative for dysuria, flank pain, frequency, hematuria and urgency.  Musculoskeletal: Negative for back pain, joint pain and myalgias.  Skin: Negative for rash.  Neurological: Negative for dizziness, tingling, focal weakness, seizures, weakness and headaches.  Endo/Heme/Allergies: Does not bruise/bleed easily.  Psychiatric/Behavioral: Negative for depression and suicidal ideas. The patient does not have insomnia.       Allergies  Allergen Reactions  . Aspirin Hives and Other (See Comments)    Difficulty breathing  . Celebrex [Celecoxib] Shortness Of Breath  . Morphine And Related Nausea And Vomiting and Swelling  . Adhesive [Tape] Other (See Comments)    Took  top layer of skin off when removed.  . Clarithromycin Nausea And Vomiting  .  Codeine Nausea And Vomiting  . Darvon [Propoxyphene Hcl] Nausea And Vomiting  . Demerol [Meperidine] Nausea And Vomiting  . Flonase [Fluticasone Propionate] Other (See Comments)    Fungal infection  . Simvastatin Other (See Comments)    "caused ulcers in mouth, and fever"  . Talwin [Pentazocine] Nausea And Vomiting     Past Medical History:  Diagnosis Date  . Anemia   . Asthma    No Inhalers--Dr. [name] will order as needed  . Bronchiectasis (HCC)    mild  . Chronic headaches     followed by [facility] migraines  . COPD (chronic obstructive pulmonary disease) ([place])   . DDD (degenerative disc disease), lumbar   . Diabetes mellitus without complication [facility])    "doctor says I no longer have diabetes"    . Diverticulosis   . Family history of adverse reaction to anesthesia    PONV  . Gall stones    history of  . GERD (gastroesophageal reflux disease)    EGD 8/09- non bleeding erosive gastritis, documentd esophageal ulcerations.   . Hiatal hernia   . History of pneumonia   . Hypercholesterolemia   . IBS (irritable bowel syndrome)   . Malignant neoplasm of upper lobe of left lung ([place]) 12/27/2018  . Murmur   . Osteoarthritis    lumbar disc disease, left hip  . PONV (postoperative nausea and vomiting)    "Only with last hip and I believe it was due to the morphine"   . Sleep apnea    cpap asked to bring mask and tubing  . Vertigo   . Weakness of right side of body      Past Surgical History:  Procedure Laterality Date  . ABDOMINAL HYSTERECTOMY  age 51  . ANTERIOR CERVICAL DECOMP/DISCECTOMY FUSION N/A 02/24/2015   Procedure: CERVICAL FOUR-FIVE, CERVICAL FIVE-SIX, CERVICAL SIX-SEVEN ANTERIOR CERVICAL DECOMPRESSION/DISCECTOMY FUSION ;  Surgeon: [name], MD;  Location: [facility];  Service: Neurosurgery;  Laterality: N/A;  C45 C56 C67 anterior cervical decompression with fusion interbody prosthesis plating and bonegraft  . APPENDECTOMY    . BACK SURGERY       4th lumbar fusion  . BLADDER SURGERY N/A    with vaginal wall repair  . BREAST CYST ASPIRATION Bilateral    neg  . BREAST SURGERY Bilateral    cyst removed and reduction  . CARDIAC CATHETERIZATION  2014  . CARPAL TUNNEL RELEASE Right 02/11/2016   Procedure: CARPAL TUNNEL RELEASE;  Surgeon: [name], MD;  Location: [facility];  Service: Orthopedics;  Laterality: Right;  . CATARACT EXTRACTION W/ INTRAOCULAR LENS IMPLANT Bilateral 2015  . CHOLECYSTECTOMY    . EUS N/A 05/31/2012   Procedure: UPPER ENDOSCOPIC ULTRASOUND (EUS) LINEAR;  Surgeon: [name], MD;  Location: [facility];  Service: Endoscopy;  Laterality: N/A;  . EXCISIONAL HEMORRHOIDECTOMY    . JOINT REPLACEMENT Bilateral   . KNEE ARTHROSCOPY WITH LATERAL MENISECTOMY Right 07/07/2015   Procedure: KNEE ARTHROSCOPY WITH LATERAL MENISECTOMY, PARTIAL SYNOVECTOMY;  Surgeon: [name], MD;  Location: [facility];  Service: Orthopedics;  Laterality: Right;  . LUMBAR LAMINECTOMY    . PORTA CATH INSERTION N/A 01/07/2019   Procedure: PORTA CATH INSERTION;  Surgeon: [name], MD;  Location: [facility];  Service: Cardiovascular;  Laterality: N/A;  . REDUCTION MAMMAPLASTY  1990  . RIGHT OOPHORECTOMY    . TOTAL HIP ARTHROPLASTY Left  05/01/2014   Dr. [name]  . TOTAL HIP ARTHROPLASTY Right 08/05/2014   Procedure: TOTAL HIP ARTHROPLASTY ANTERIOR APPROACH;  Surgeon: [name], MD;  Location: [facility];  Service: Orthopedics;  Laterality: Right;  . ULNAR NERVE TRANSPOSITION Right 02/11/2016   Procedure: ULNAR NERVE DECOMPRESSION/TRANSPOSITION;  Surgeon: [name], MD;  Location: [facility];  Service: Orthopedics;  Laterality: Right;  Marland Kitchen VIDEO BRONCHOSCOPY WITH ENDOBRONCHIAL ULTRASOUND Left 12/19/2018   Procedure: VIDEO BRONCHOSCOPY WITH ENDOBRONCHIAL ULTRASOUND, LEFT, SLEEP APNEA;  Surgeon: [name], MD;  Location: [facility];  Service: Thoracic;  Laterality: Left;    Social History   Socioeconomic History   . Marital status: Married    Spouse name: Not on file  . Number of children: Not on file  . Years of education: Not on file  . Highest education level: Not on file  Occupational History  . Not on file  Tobacco Use  . Smoking status: Former Smoker    Quit date: 06/13/2007    Years since quitting: 12.2  . Smokeless tobacco: Never Used  Vaping Use  . Vaping Use: Never used  Substance and Sexual Activity  . Alcohol use: No    Alcohol/week: 0.0 standard drinks  . Drug use: No  . Sexual activity: Never  Other Topics Concern  . Not on file  Social History Narrative  . Not on file   Social Determinants of Health   Financial Resource Strain:   . Difficulty of Paying Living Expenses:   Food Insecurity:   . Worried About Charity fundraiser in the Last Year:   . Arboriculturist in the Last Year:   Transportation Needs:   . Film/video editor (Medical):   Marland Kitchen Lack of Transportation (Non-Medical):   Physical Activity:   . Days of Exercise per Week:   . Minutes of Exercise per Session:   Stress:   . Feeling of Stress :   Social Connections:   . Frequency of Communication with Friends and Family:   . Frequency of Social Gatherings with Friends and Family:   . Attends Religious Services:   . Active Member of Clubs or Organizations:   . Attends Archivist Meetings:   Marland Kitchen Marital Status:   Intimate Partner Violence:   . Fear of Current or Ex-Partner:   . Emotionally Abused:   Marland Kitchen Physically Abused:   . Sexually Abused:     Family History  Problem Relation Age of Onset  . Heart disease Mother        s/p stent  . Hypertension Mother   . Hypercholesterolemia Mother   . Diabetes Father   . Stomach cancer Other        uncle  . Breast cancer Cousin   . Breast cancer Paternal Aunt      Current Outpatient Medications:  .  albuterol (PROVENTIL) (2.5 MG/3ML) 0.083% nebulizer solution, Take 2.5 mg by nebulization every 6 (six) hours as needed for wheezing., Disp: , Rfl:   .  albuterol (VENTOLIN HFA) 108 (90 Base) MCG/ACT inhaler, Inhale 2 puffs into the lungs every 6 (six) hours as needed for wheezing or shortness of breath., Disp: 18 g, Rfl: 0 .  Bioflavonoid Products (ESTER C PO), Take 1 tablet by mouth 3 (three) times daily., Disp: , Rfl:  .  Calcium-Magnesium-Zinc (CAL-MAG-ZINC PO), Take 1 tablet by mouth daily., Disp: , Rfl:  .  cetirizine (ZYRTEC) 5 MG tablet, Take 1 tablet (5 mg total) by mouth daily. (Patient taking  differently: Take 5 mg by mouth daily as needed (runny nose.). ), Disp: 30 tablet, Rfl: 0 .  CHLORASEPTIC MAX SORE THROAT 1.5-33 % LIQD, Use as directed 1 spray in the mouth or throat as needed (throat irritation.)., Disp: , Rfl:  .  Cholecalciferol (EQL VITAMIN D3) 25 MCG (1000 UT) capsule, Take 4,000 Units by mouth daily., Disp: , Rfl:  .  CHROMIUM PO, Take 1 capsule by mouth daily., Disp: , Rfl:  .  Coenzyme Q10 (CO Q 10) 100 MG CAPS, Take 100 mg by mouth daily. , Disp: , Rfl:  .  Cyanocobalamin (VITAMIN B-12) 2500 MCG SUBL, Place 2,500 mcg under the tongue daily., Disp: , Rfl:  .  cyclobenzaprine (FLEXERIL) 10 MG tablet, Take 10 mg by mouth at bedtime. , Disp: , Rfl:  .  DHEA 25 MG CAPS, Take 25 mg by mouth daily., Disp: , Rfl:  .  Ensure (ENSURE), Take 237 mLs by mouth 2 (two) times daily between meals., Disp: , Rfl:  .  Erenumab-aooe (AIMOVIG) 140 MG/ML SOAJ, Aimovig Autoinjector 140 mg/mL subcutaneous auto-injector, Disp: , Rfl:  .  ezetimibe (ZETIA) 10 MG tablet, Take 10 mg by mouth at bedtime., Disp: , Rfl:  .  folic acid (FOLVITE) 952 MCG tablet, Take 400 mcg by mouth 2 (two) times daily. , Disp: , Rfl:  .  Garlic 8413 MG CAPS, Take 1,000 mg by mouth daily., Disp: , Rfl:  .  Histamine Dihydrochloride (AUSTRALIAN DREAM ARTHRITIS EX), Apply 1 application topically 4 (four) times daily as needed (pain.)., Disp: , Rfl:  .  HYDROcodone-acetaminophen (NORCO/VICODIN) 5-325 MG tablet, 1-2 tabs po bid prn (Patient taking differently: Take 1  tablet by mouth at bedtime. ), Disp: 10 tablet, Rfl: 0 .  HYDROcodone-homatropine (HYCODAN) 5-1.5 MG/5ML syrup, Take 5 mLs by mouth 3 (three) times daily as needed for cough., Disp: 120 mL, Rfl: 0 .  ipratropium (ATROVENT) 0.06 % nasal spray, Place 2 sprays into both nostrils 4 (four) times daily as needed for rhinitis., Disp: 15 mL, Rfl: 0 .  lidocaine-prilocaine (EMLA) cream, lidocaine-prilocaine 2.5 %-2.5 % topical cream, Disp: , Rfl:  .  meclizine (ANTIVERT) 25 MG tablet, Take 1 tablet (25 mg total) by mouth every 6 (six) hours as needed for dizziness. (Patient not taking: Reported on 09/10/2019), Disp: 30 tablet, Rfl: 0 .  montelukast (SINGULAIR) 10 MG tablet, Take 10 mg by mouth at bedtime. , Disp: , Rfl:  .  NON FORMULARY, Take 1 capsule by mouth daily., Disp: , Rfl:  .  ondansetron (ZOFRAN-ODT) 4 MG disintegrating tablet, Take 4 mg by mouth every 8 (eight) hours as needed for nausea/vomiting., Disp: , Rfl:  .  Polyethyl Glycol-Propyl Glycol (SYSTANE) 0.4-0.3 % SOLN, Place 1 drop into both eyes 5 (five) times daily as needed (dry/irritated eyes). , Disp: , Rfl:  .  pyridOXINE (VITAMIN B-6) 100 MG tablet, Take 100 mg by mouth daily., Disp: , Rfl:  .  Selenium 200 MCG CAPS, Take 200 mcg by mouth daily., Disp: , Rfl:  .  sucralfate (CARAFATE) 1 g tablet, 1 tablet 3 (three) times daily as needed. , Disp: , Rfl:  .  umeclidinium-vilanterol (ANORO ELLIPTA) 62.5-25 MCG/INH AEPB, Inhale 1 puff into the lungs as needed. , Disp: , Rfl:  .  vitamin E 400 UNIT capsule, Take 400 Units by mouth daily., Disp: , Rfl:  .  Zoledronic Acid (RECLAST IV), Inject 1 Dose into the vein as directed. ONCE A YEAR, Disp: , Rfl:   Physical  exam: There were no vitals filed for this visit. Physical Exam HENT:     Head: Normocephalic and atraumatic.  Eyes:     Pupils: Pupils are equal, round, and reactive to light.  Cardiovascular:     Rate and Rhythm: Normal rate and regular rhythm.     Heart sounds: Normal heart  sounds.  Pulmonary:     Effort: Pulmonary effort is normal.     Breath sounds: Normal breath sounds.  Abdominal:     General: Bowel sounds are normal.     Palpations: Abdomen is soft.  Musculoskeletal:     Cervical back: Normal range of motion.  Lymphadenopathy:     Comments: No palpable cervical adenopathy  Skin:    General: Skin is warm and dry.  Neurological:     Mental Status: She is alert and oriented to person, place, and time.      CMP Latest Ref Rng & Units 09/10/2019  Glucose 70 - 99 mg/dL 97  BUN 8 - 23 mg/dL 13  Creatinine 0.44 - 1.00 mg/dL 0.47  Sodium 135 - 145 mmol/L 137  Potassium 3.5 - 5.1 mmol/L 4.2  Chloride 98 - 111 mmol/L 102  CO2 22 - 32 mmol/L 28  Calcium 8.9 - 10.3 mg/dL 9.0  Total Protein 6.5 - 8.1 g/dL 6.8  Total Bilirubin 0.3 - 1.2 mg/dL 0.7  Alkaline Phos 38 - 126 U/L 75  AST 15 - 41 U/L 18  ALT 0 - 44 U/L 18   CBC Latest Ref Rng & Units 09/26/2019  WBC 4.0 - 10.5 K/uL 5.2  Hemoglobin 12.0 - 15.0 g/dL 12.2  Hematocrit 36 - 46 % 36.5  Platelets 150 - 400 K/uL 225    No images are attached to the encounter.  US Soft Tissue Head/Neck  Result Date: 09/06/2019 CLINICAL DATA:  Left lateral neck soft tissue swelling EXAM: ULTRASOUND OF HEAD/NECK SOFT TISSUES TECHNIQUE: Ultrasound examination of the head and neck soft tissues was performed in the area of clinical concern. COMPARISON:  07/02/2019 CT FINDINGS: Superficial soft tissue ultrasound performed of the left lateral neck area of concern. No significant underlying soft tissue mass, cyst, bulky adenopathy, fluid collection, or abscess. Small benign lymph nodes noted. IMPRESSION: No significant left lateral neck soft tissue abnormality by ultrasound. Electronically Signed   By: Jerilynn Mages.  Shick M.D.   On: 09/06/2019 09:29     Assessment and plan- Patient is a 74 y.o. female with stage III adenocarcinoma of the lung.  She is here for on treatment assessment prior to cycle 12 of maintenance  durvalumab  Counts okay to proceed with cycle 12 of maintenance durvalumab today. She was complaining of neck swelling and is getting a CT soft tissue neck along with surveillance CT chest abdomen and pelvis with contrast in about 10 days time. She will be seen by covering MD in 2 weeks with CBC with differential, CMP and TSH for cycle 13 and I will see her back in 4 weeks for cycle 14  Cough: Likely noninfectious.  Patient is also completed recent course of amoxicillin given by ENT.  She is not hypoxic and lungs sound clear on auscultation.  Continue to monitor   Visit Diagnosis 1. Encounter for antineoplastic immunotherapy   2. Malignant neoplasm of upper lobe of left lung ([place])      Dr. Randa Evens, MD, MPH [facility] [phone] 09/26/2019 8:33 AM

## 2019-09-26 NOTE — Addendum Note (Signed)
Addended by: Kern Alberta on: 09/26/2019 10:08 AM   Modules accepted: Orders

## 2019-10-01 ENCOUNTER — Ambulatory Visit: Admission: RE | Admit: 2019-10-01 | Payer: Medicare Other | Source: Ambulatory Visit

## 2019-10-08 ENCOUNTER — Other Ambulatory Visit: Payer: Self-pay

## 2019-10-08 ENCOUNTER — Ambulatory Visit
Admission: RE | Admit: 2019-10-08 | Discharge: 2019-10-08 | Disposition: A | Discharge status: Home / Self Care | Payer: Medicare Other | Source: Ambulatory Visit | Attending: Oncology | Admitting: Oncology

## 2019-10-08 DIAGNOSIS — C3412 Malignant neoplasm of upper lobe, left bronchus or lung: Secondary | ICD-10-CM | POA: Insufficient documentation

## 2019-10-08 DIAGNOSIS — R070 Pain in throat: Secondary | ICD-10-CM | POA: Diagnosis present

## 2019-10-08 MED ORDER — IOHEXOL 300 MG/ML  SOLN
150.0000 mL | Freq: Once | INTRAMUSCULAR | Status: AC | PRN
  Administered 2019-10-08: 125 mL via INTRAVENOUS

## 2019-10-10 ENCOUNTER — Other Ambulatory Visit: Payer: Self-pay

## 2019-10-10 ENCOUNTER — Inpatient Hospital Stay: Payer: Medicare Other

## 2019-10-10 ENCOUNTER — Encounter: Payer: Self-pay | Admitting: Internal Medicine

## 2019-10-10 ENCOUNTER — Inpatient Hospital Stay (HOSPITAL_BASED_OUTPATIENT_CLINIC_OR_DEPARTMENT_OTHER): Payer: Medicare Other | Admitting: Internal Medicine

## 2019-10-10 DIAGNOSIS — C3412 Malignant neoplasm of upper lobe, left bronchus or lung: Secondary | ICD-10-CM | POA: Diagnosis not present

## 2019-10-10 DIAGNOSIS — Z5112 Encounter for antineoplastic immunotherapy: Secondary | ICD-10-CM | POA: Diagnosis not present

## 2019-10-10 DIAGNOSIS — M4722 Other spondylosis with radiculopathy, cervical region: Secondary | ICD-10-CM

## 2019-10-10 LAB — COMPREHENSIVE METABOLIC PANEL
ALT: 19 U/L (ref 0–44)
AST: 20 U/L (ref 15–41)
Albumin: 4.2 g/dL (ref 3.5–5.0)
Alkaline Phosphatase: 62 U/L (ref 38–126)
Anion gap: 9 (ref 5–15)
BUN: 12 mg/dL (ref 8–23)
CO2: 26 mmol/L (ref 22–32)
Calcium: 8.8 mg/dL — ABNORMAL LOW (ref 8.9–10.3)
Chloride: 103 mmol/L (ref 98–111)
Creatinine, Ser: 0.5 mg/dL (ref 0.44–1.00)
GFR calc Af Amer: >60 mL/min (ref >60)
GFR calc non Af Amer: >60 mL/min (ref >60)
Glucose, Bld: 88 mg/dL (ref 70–99)
Potassium: 4 mmol/L (ref 3.5–5.1)
Sodium: 138 mmol/L (ref 135–145)
Total Bilirubin: 0.6 mg/dL (ref 0.3–1.2)
Total Protein: 6.5 g/dL (ref 6.5–8.1)

## 2019-10-10 LAB — CBC WITH DIFFERENTIAL/PLATELET
Abs Immature Granulocytes: 0.01 10*3/uL (ref 0.00–0.07)
Basophils Absolute: 0 10*3/uL (ref 0.0–0.1)
Basophils Relative: 0 %
Eosinophils Absolute: 0.1 10*3/uL (ref 0.0–0.5)
Eosinophils Relative: 3 %
HCT: 37.2 % (ref 36.0–46.0)
Hemoglobin: 12.5 g/dL (ref 12.0–15.0)
Immature Granulocytes: 0 %
Lymphocytes Relative: 28 %
Lymphs Abs: 0.9 10*3/uL (ref 0.7–4.0)
MCH: 30.7 pg (ref 26.0–34.0)
MCHC: 33.6 g/dL (ref 30.0–36.0)
MCV: 91.4 fL (ref 80.0–100.0)
Monocytes Absolute: 0.3 10*3/uL (ref 0.1–1.0)
Monocytes Relative: 10 %
Neutro Abs: 1.8 10*3/uL (ref 1.7–7.7)
Neutrophils Relative %: 59 %
Platelets: 245 10*3/uL (ref 150–400)
RBC: 4.07 MIL/uL (ref 3.87–5.11)
RDW: 13.2 % (ref 11.5–15.5)
WBC: 3.1 10*3/uL — ABNORMAL LOW (ref 4.0–10.5)
nRBC: 0 % (ref 0.0–0.2)

## 2019-10-10 LAB — TSH: TSH: 3.94 u[IU]/mL (ref 0.350–4.500)

## 2019-10-10 MED ORDER — SODIUM CHLORIDE 0.9 % IV SOLN
10.0000 mg/kg | Freq: Once | INTRAVENOUS | Status: AC
  Administered 2019-10-10: 740 mg via INTRAVENOUS
  Filled 2019-10-10: qty 10

## 2019-10-10 MED ORDER — SODIUM CHLORIDE 0.9 % IV SOLN
Freq: Once | INTRAVENOUS | Status: AC
  Filled 2019-10-10: qty 250

## 2019-10-10 MED ORDER — HEPARIN SOD (PORK) LOCK FLUSH 100 UNIT/ML IV SOLN
INTRAVENOUS | Status: AC
  Filled 2019-10-10: qty 5

## 2019-10-10 MED ORDER — SODIUM CHLORIDE 0.9% FLUSH
10.0000 mL | Freq: Once | INTRAVENOUS | Status: AC
  Administered 2019-10-10: 10 mL via INTRAVENOUS
  Filled 2019-10-10: qty 10

## 2019-10-10 MED ORDER — HEPARIN SOD (PORK) LOCK FLUSH 100 UNIT/ML IV SOLN
500.0000 [IU] | Freq: Once | INTRAVENOUS | Status: AC | PRN
  Administered 2019-10-10: 500 [IU]
  Filled 2019-10-10: qty 5

## 2019-10-10 NOTE — Progress Notes (Signed)
[facility] CONSULT NOTE  Patient Care Team: [name], MD as PCP - General (Family Medicine) Telford Nab, RN as Registered Nurse  CHIEF COMPLAINTS/PURPOSE OF CONSULTATION: Lung cancer  #  Oncology History Overview Note  #October 2020-stage III lung cancer-chemoradiation; currently on maintenance durvalumab.   Malignant neoplasm of upper lobe of left lung ([place])  12/27/2018 Initial Diagnosis   Malignant neoplasm of upper lobe of left lung ([place])   12/27/2018 Cancer Staging   Staging form: Lung, AJCC 8th Edition - Clinical stage from 12/27/2018: Stage IIIA (cT1b, cN2, cM0) - Signed by Sindy Guadeloupe, MD on 12/27/2018   01/14/2019 - 03/18/2019 Chemotherapy   The patient had palonosetron (ALOXI) injection 0.25 mg, 0.25 mg, Intravenous,  Once, 7 of 7 cycles Administration: 0.25 mg (01/14/2019), 0.25 mg (01/21/2019), 0.25 mg (01/28/2019), 0.25 mg (02/04/2019), 0.25 mg (02/12/2019), 0.25 mg (02/19/2019), 0.25 mg (03/12/2019) CARBOplatin (PARAPLATIN) 170 mg in sodium chloride 0.9 % 100 mL chemo infusion, 170 mg (100 % of original dose 171.2 mg), Intravenous,  Once, 7 of 7 cycles Dose modification:   (original dose 171.2 mg, Cycle 1) Administration: 170 mg (01/14/2019), 170 mg (01/21/2019), 170 mg (01/28/2019), 170 mg (02/04/2019), 170 mg (02/12/2019), 170 mg (02/19/2019), 130 mg (03/12/2019) PACLitaxel (TAXOL) 84 mg in sodium chloride 0.9 % 250 mL chemo infusion (</= 80mg /m2), 45 mg/m2 = 84 mg, Intravenous,  Once, 7 of 7 cycles Administration: 84 mg (01/14/2019), 84 mg (01/21/2019), 84 mg (01/28/2019), 84 mg (02/04/2019), 84 mg (02/12/2019), 84 mg (02/19/2019), 84 mg (03/12/2019)  for chemotherapy treatment.    04/04/2019 -  Chemotherapy   The patient had durvalumab (IMFINZI) 740 mg in sodium chloride 0.9 % 100 mL chemo infusion, 10 mg/kg = 740 mg, Intravenous,  Once, 13 of 14 cycles Administration: 740 mg (04/04/2019), 740 mg (04/18/2019), 740 mg (05/14/2019), 740 mg (05/28/2019), 740 mg  (06/11/2019), 740 mg (06/25/2019), 740 mg (07/09/2019), 740 mg (07/23/2019), 740 mg (08/06/2019), 740 mg (08/27/2019), 740 mg (09/10/2019), 740 mg (09/26/2019), 740 mg (10/10/2019)  for chemotherapy treatment.       HISTORY OF PRESENTING ILLNESS:  Jennifer Hodges 74 y.o.  female stage III adenocarcinoma of the lung currently on maintenance durvalumab is here for follow-up/review results of the surveillance CT chest and pelvis/neck.  Patient denies any worsening shortness of breath or cough.  Cough is improved on albuterol.  No headaches.  No nausea no vomiting.  Chronic mild tingling and numbness next images.   Joint pains-interested in "stem cell injections".  Wants to know if it safe with ongoing immunotherapy.  Review of Systems  Constitutional: Negative for chills, diaphoresis, fever, malaise/fatigue and weight loss.  HENT: Negative for nosebleeds and sore throat.   Eyes: Negative for double vision.  Respiratory: Positive for shortness of breath. Negative for cough, hemoptysis, sputum production and wheezing.   Cardiovascular: Negative for chest pain, palpitations, orthopnea and leg swelling.  Gastrointestinal: Negative for abdominal pain, blood in stool, constipation, diarrhea, heartburn, melena, nausea and vomiting.  Genitourinary: Negative for dysuria, frequency and urgency.  Musculoskeletal: Positive for back pain and joint pain.  Skin: Negative.  Negative for itching and rash.  Neurological: Positive for tingling. Negative for dizziness, focal weakness, weakness and headaches.  Endo/Heme/Allergies: Does not bruise/bleed easily.  Psychiatric/Behavioral: Negative for depression. The patient is not nervous/anxious and does not have insomnia.      MEDICAL HISTORY:  Past Medical History:  Diagnosis Date  . Anemia   . Asthma    No Inhalers--Dr.  [name] will order as needed  . Bronchiectasis (HCC)    mild  . Chronic headaches     followed by [facility] migraines  . COPD (chronic  obstructive pulmonary disease) ([place])   . DDD (degenerative disc disease), lumbar   . Diabetes mellitus without complication [facility])    "doctor says I no longer have diabetes"    . Diverticulosis   . Family history of adverse reaction to anesthesia    PONV  . Gall stones    history of  . GERD (gastroesophageal reflux disease)    EGD 8/09- non bleeding erosive gastritis, documentd esophageal ulcerations.   . Hiatal hernia   . History of pneumonia   . Hypercholesterolemia   . IBS (irritable bowel syndrome)   . Malignant neoplasm of upper lobe of left lung ([place]) 12/27/2018  . Murmur   . Osteoarthritis    lumbar disc disease, left hip  . PONV (postoperative nausea and vomiting)    "Only with last hip and I believe it was due to the morphine"   . Sleep apnea    cpap asked to bring mask and tubing  . Vertigo   . Weakness of right side of body     SURGICAL HISTORY: Past Surgical History:  Procedure Laterality Date  . ABDOMINAL HYSTERECTOMY  age 67  . ANTERIOR CERVICAL DECOMP/DISCECTOMY FUSION N/A 02/24/2015   Procedure: CERVICAL FOUR-FIVE, CERVICAL FIVE-SIX, CERVICAL SIX-SEVEN ANTERIOR CERVICAL DECOMPRESSION/DISCECTOMY FUSION ;  Surgeon: [name], MD;  Location: [place] [facility];  Service: Neurosurgery;  Laterality: N/A;  C45 C56 C67 anterior cervical decompression with fusion interbody prosthesis plating and bonegraft  . APPENDECTOMY    . BACK SURGERY     4th lumbar fusion  . BLADDER SURGERY N/A    with vaginal wall repair  . BREAST CYST ASPIRATION Bilateral    neg  . BREAST SURGERY Bilateral    cyst removed and reduction  . CARDIAC CATHETERIZATION  2014  . CARPAL TUNNEL RELEASE Right 02/11/2016   Procedure: CARPAL TUNNEL RELEASE;  Surgeon: [name], MD;  Location: [facility] ORS;  Service: Orthopedics;  Laterality: Right;  . CATARACT EXTRACTION W/ INTRAOCULAR LENS IMPLANT Bilateral 2015  . CHOLECYSTECTOMY    . EUS N/A 05/31/2012   Procedure: UPPER ENDOSCOPIC ULTRASOUND (EUS)  LINEAR;  Surgeon: [name], MD;  Location: [facility];  Service: Endoscopy;  Laterality: N/A;  . EXCISIONAL HEMORRHOIDECTOMY    . JOINT REPLACEMENT Bilateral   . KNEE ARTHROSCOPY WITH LATERAL MENISECTOMY Right 07/07/2015   Procedure: KNEE ARTHROSCOPY WITH LATERAL MENISECTOMY, PARTIAL SYNOVECTOMY;  Surgeon: [name], MD;  Location: [facility] ORS;  Service: Orthopedics;  Laterality: Right;  . LUMBAR LAMINECTOMY    . PORTA CATH INSERTION N/A 01/07/2019   Procedure: PORTA CATH INSERTION;  Surgeon: [name], MD;  Location: [place] CV LAB;  Service: Cardiovascular;  Laterality: N/A;  . REDUCTION MAMMAPLASTY  1990  . RIGHT OOPHORECTOMY    . TOTAL HIP ARTHROPLASTY Left 05/01/2014   Dr. [name]  . TOTAL HIP ARTHROPLASTY Right 08/05/2014   Procedure: TOTAL HIP ARTHROPLASTY ANTERIOR APPROACH;  Surgeon: [name], MD;  Location: [facility] ORS;  Service: Orthopedics;  Laterality: Right;  . ULNAR NERVE TRANSPOSITION Right 02/11/2016   Procedure: ULNAR NERVE DECOMPRESSION/TRANSPOSITION;  Surgeon: [name], MD;  Location: [facility] ORS;  Service: Orthopedics;  Laterality: Right;  Marland Kitchen VIDEO BRONCHOSCOPY WITH ENDOBRONCHIAL ULTRASOUND Left 12/19/2018   Procedure: VIDEO BRONCHOSCOPY WITH ENDOBRONCHIAL ULTRASOUND, LEFT, SLEEP APNEA;  Surgeon: [name], MD;  Location:  [facility] ORS;  Service: Thoracic;  Laterality: Left;    SOCIAL HISTORY: Social History   Socioeconomic History  . Marital status: Married    Spouse name: Not on file  . Number of children: Not on file  . Years of education: Not on file  . Highest education level: Not on file  Occupational History  . Not on file  Tobacco Use  . Smoking status: Former Smoker    Quit date: 06/13/2007    Years since quitting: 12.3  . Smokeless tobacco: Never Used  Vaping Use  . Vaping Use: Never used  Substance and Sexual Activity  . Alcohol use: No    Alcohol/week: 0.0 standard drinks  . Drug use: No  . Sexual activity: Never  Other  Topics Concern  . Not on file  Social History Narrative  . Not on file   Social Determinants of Health   Financial Resource Strain:   . Difficulty of Paying Living Expenses:   Food Insecurity:   . Worried About Charity fundraiser in the Last Year:   . Arboriculturist in the Last Year:   Transportation Needs:   . Film/video editor (Medical):   Marland Kitchen Lack of Transportation (Non-Medical):   Physical Activity:   . Days of Exercise per Week:   . Minutes of Exercise per Session:   Stress:   . Feeling of Stress :   Social Connections:   . Frequency of Communication with Friends and Family:   . Frequency of Social Gatherings with Friends and Family:   . Attends Religious Services:   . Active Member of Clubs or Organizations:   . Attends Archivist Meetings:   Marland Kitchen Marital Status:   Intimate Partner Violence:   . Fear of Current or Ex-Partner:   . Emotionally Abused:   Marland Kitchen Physically Abused:   . Sexually Abused:     FAMILY HISTORY: Family History  Problem Relation Age of Onset  . Heart disease Mother        s/p stent  . Hypertension Mother   . Hypercholesterolemia Mother   . Diabetes Father   . Stomach cancer Other        uncle  . Breast cancer Cousin   . Breast cancer Paternal Aunt     ALLERGIES:  is allergic to aspirin, celebrex [celecoxib], morphine and related, adhesive [tape], clarithromycin, codeine, darvon [propoxyphene hcl], demerol [meperidine], flonase [fluticasone propionate], simvastatin, and talwin [pentazocine].  MEDICATIONS:  Current Outpatient Medications  Medication Sig Dispense Refill  . albuterol (PROVENTIL) (2.5 MG/3ML) 0.083% nebulizer solution Take 2.5 mg by nebulization every 6 (six) hours as needed for wheezing.    Marland Kitchen albuterol (VENTOLIN HFA) 108 (90 Base) MCG/ACT inhaler Inhale 2 puffs into the lungs every 6 (six) hours as needed for wheezing or shortness of breath. 18 g 0  . Bioflavonoid Products (ESTER C PO) Take 1 tablet by mouth 3  (three) times daily.    . Calcium-Magnesium-Zinc (CAL-MAG-ZINC PO) Take 1 tablet by mouth daily.    . cetirizine (ZYRTEC) 5 MG tablet Take 1 tablet (5 mg total) by mouth daily. (Patient taking differently: Take 5 mg by mouth daily as needed (runny nose.). ) 30 tablet 0  . Cholecalciferol (EQL VITAMIN D3) 25 MCG (1000 UT) capsule Take 4,000 Units by mouth daily.    . CHROMIUM PO Take 1 capsule by mouth daily.    . Coenzyme Q10 (CO Q 10) 100 MG CAPS Take 100 mg by mouth daily.     Marland Kitchen  Cyanocobalamin (VITAMIN B-12) 2500 MCG SUBL Place 2,500 mcg under the tongue daily.    . cyclobenzaprine (FLEXERIL) 10 MG tablet Take 10 mg by mouth at bedtime.     Marland Kitchen DHEA 25 MG CAPS Take 25 mg by mouth daily.    . Ensure (ENSURE) Take 237 mLs by mouth 2 (two) times daily between meals.    Marland Kitchen ezetimibe (ZETIA) 10 MG tablet Take 10 mg by mouth at bedtime.    . folic acid (FOLVITE) 893 MCG tablet Take 400 mcg by mouth 2 (two) times daily.     . Garlic 8101 MG CAPS Take 1,000 mg by mouth daily.    Marland Kitchen Histamine Dihydrochloride (AUSTRALIAN DREAM ARTHRITIS EX) Apply 1 application topically 4 (four) times daily as needed (pain.).    Marland Kitchen HYDROcodone-acetaminophen (NORCO/VICODIN) 5-325 MG tablet 1-2 tabs po bid prn (Patient taking differently: Take 1 tablet by mouth at bedtime. ) 10 tablet 0  . HYDROcodone-homatropine (HYCODAN) 5-1.5 MG/5ML syrup Take 5 mLs by mouth 3 (three) times daily as needed for cough. 120 mL 0  . ipratropium (ATROVENT) 0.06 % nasal spray Place 2 sprays into both nostrils 4 (four) times daily as needed for rhinitis. 15 mL 0  . lidocaine-prilocaine (EMLA) cream lidocaine-prilocaine 2.5 %-2.5 % topical cream    . meclizine (ANTIVERT) 25 MG tablet Take 1 tablet (25 mg total) by mouth every 6 (six) hours as needed for dizziness. 30 tablet 0  . montelukast (SINGULAIR) 10 MG tablet Take 10 mg by mouth at bedtime.     . NON FORMULARY Take 1 capsule by mouth daily.    . ondansetron (ZOFRAN-ODT) 4 MG disintegrating  tablet Take 4 mg by mouth every 8 (eight) hours as needed for nausea/vomiting.    Vladimir Faster Glycol-Propyl Glycol (SYSTANE) 0.4-0.3 % SOLN Place 1 drop into both eyes 5 (five) times daily as needed (dry/irritated eyes).     . pyridOXINE (VITAMIN B-6) 100 MG tablet Take 100 mg by mouth daily.    . Rimegepant Sulfate 75 MG TBDP Take by mouth.    . Selenium 200 MCG CAPS Take 200 mcg by mouth daily.    . sucralfate (CARAFATE) 1 g tablet 1 tablet 3 (three) times daily as needed.     . umeclidinium-vilanterol (ANORO ELLIPTA) 62.5-25 MCG/INH AEPB Inhale 1 puff into the lungs as needed.     . vitamin E 400 UNIT capsule Take 400 Units by mouth daily.    . Zoledronic Acid (RECLAST IV) Inject 1 Dose into the vein as directed. ONCE A YEAR    . CHLORASEPTIC MAX SORE THROAT 1.5-33 % LIQD Use as directed 1 spray in the mouth or throat as needed (throat irritation.). (Patient not taking: Reported on 09/26/2019)     No current facility-administered medications for this visit.      Marland Kitchen  PHYSICAL EXAMINATION: ECOG PERFORMANCE STATUS: 1 - Symptomatic but completely ambulatory  Vitals:   10/10/19 0855  BP: (!) 110/57  Pulse: 72  Resp: 16  Temp: (!) 97.5 F (36.4 C)  SpO2: 99%   Filed Weights   10/10/19 0855  Weight: 158 lb (71.7 kg)    Physical Exam HENT:     Head: Normocephalic and atraumatic.     Mouth/Throat:     Pharynx: No oropharyngeal exudate.  Eyes:     Pupils: Pupils are equal, round, and reactive to light.  Cardiovascular:     Rate and Rhythm: Normal rate and regular rhythm.  Pulmonary:     Effort:  No respiratory distress.     Breath sounds: No wheezing.     Comments: Decreased air entry bilaterally. Abdominal:     General: Bowel sounds are normal. There is no distension.     Palpations: Abdomen is soft. There is no mass.     Tenderness: There is no abdominal tenderness. There is no guarding or rebound.  Musculoskeletal:        General: No tenderness. Normal range of motion.      Cervical back: Normal range of motion and neck supple.  Skin:    General: Skin is warm.  Neurological:     Mental Status: She is alert and oriented to person, place, and time.  Psychiatric:        Mood and Affect: Affect normal.      LABORATORY DATA:  I have reviewed the data as listed Lab Results  Component Value Date   WBC 3.1 (L) 10/10/2019   HGB 12.5 10/10/2019   HCT 37.2 10/10/2019   MCV 91.4 10/10/2019   PLT 245 10/10/2019   Recent Labs    09/10/19 0955 09/26/19 0813 10/10/19 0831  NA 137 137 138  K 4.2 4.0 4.0  CL 102 101 103  CO2 28 28 26   GLUCOSE 97 87 88  BUN 13 12 12   CREATININE 0.47 0.57 0.50  CALCIUM 9.0 8.5* 8.8*  GFRNONAA >60 >60 >60  GFRAA >60 >60 >60  PROT 6.8 6.5 6.5  ALBUMIN 4.0 3.8 4.2  AST 18 20 20   ALT 18 24 19   ALKPHOS 75 67 62  BILITOT 0.7 0.5 0.6    RADIOGRAPHIC STUDIES: I have personally reviewed the radiological images as listed and agreed with the findings in the report. CT SOFT TISSUE NECK W CONTRAST  Result Date: 10/08/2019 CLINICAL DATA:  74 year old female restaging lung cancer. Left lateral neck swelling last month. EXAM: CT NECK WITH CONTRAST TECHNIQUE: Multidetector CT imaging of the neck was performed using the standard protocol following the bolus administration of intravenous contrast. CONTRAST:  151mL OMNIPAQUE IOHEXOL 300 MG/ML SOLN in conjunction with contrast enhanced imaging of the chest, abdomen, and pelvis reported separately. COMPARISON:  CT Chest, Abdomen, and Pelvis today reported separately. Neck ultrasound 09/05/2019. Brain MRI 05/08/2019. Neck CTA 10/21/2005 FINDINGS: Pharynx and larynx: Laryngeal and pharyngeal soft tissue contours are within normal limits. Negative parapharyngeal and retropharyngeal spaces. Salivary glands: Negative sublingual space. Submandibular glands and parotid glands are within normal limits. Thyroid: Negative. Lymph nodes: Negative.  No cervical lymphadenopathy. Vascular: Right IJ  Port-A-Cath. The major vascular structures in the neck and at the skull base are enhancing and patent. Carotid bifurcation and ICA siphon atherosclerosis. Limited intracranial: Negative. Visualized orbits: Postoperative changes to both globes, otherwise negative. Mastoids and visualized paranasal sinuses: Mild right side posterior ethmoid mucosal thickening. Largely resolved right maxillary sinus mucous retention cysts seen in 2007. Otherwise clear. Skeleton: Extensive prior cervical ACDF from C4-C7. Adjacent segment cervical spine degeneration. TMJ degeneration. No acute or suspicious osseous lesion identified. Upper chest: Reported separately today. IMPRESSION: 1. No metastatic disease identified in the neck. No neck mass or acute finding. 2.  CT Chest, Abdomen, and Pelvis today are reported separately. Electronically Signed   By: Genevie Ann M.D.   On: 10/08/2019 19:41   CT Chest W Contrast  Result Date: 10/08/2019 CLINICAL DATA:  Restaging lung cancer. EXAM: CT CHEST, ABDOMEN, AND PELVIS WITH CONTRAST TECHNIQUE: Multidetector CT imaging of the chest, abdomen and pelvis was performed following the standard protocol during bolus administration  of intravenous contrast. CONTRAST:  17mL OMNIPAQUE IOHEXOL 300 MG/ML  SOLN COMPARISON:  July 02, 2019 FINDINGS: CT CHEST FINDINGS Cardiovascular: Normal heart size. No pericardial effusion. Aortic atherosclerosis no aneurysm. Lad and RCA coronary artery calcifications. Mediastinum/Nodes: Normal appearance of the thyroid gland. The trachea appears patent and is midline. Normal appearance of the esophagus. No enlarged axillary, supraclavicular, mediastinal or hilar adenopathy. Lungs/Pleura: Mild to moderate centrilobular emphysema. No pleural effusion, airspace consolidation, or atelectasis. Paramediastinal and perihilar fibrotic changes are identified within the left lung compatible with changes secondary to external beam radiation. Findings appear progressive when compared  with 07/02/2019. The index nodule within the anteromedial left upper lobe at the level of the aortic arch is no longer confidently identified but may be obscured by progressive changes due to external beam radiation. Index nodule within the right lower lobe measures 3 mm, image 102/14. Unchanged. The new ill-defined irregular opacity in the posterior left lower lobe has resolved in the interval with confluent bandlike area of fibrotic changes in its place, likely reflecting changes due to external beam radiation. Similarly, the previously described sub solid nodular opacity within the superior segment of left lower lobe has resolved. No new or progressive nodularity identified. No convincing evidence for residual tumor identified at this time. Small calcified granuloma is identified within the anterior right upper lobe, image 49/14. Musculoskeletal: No chest wall mass or suspicious bone lesions identified. CT ABDOMEN PELVIS FINDINGS Hepatobiliary: No suspicious liver abnormality. Previous cholecystectomy. Similar appearance of mild to moderate intrahepatic biliary dilatation with increase caliber of the CBD. No obstructing stone or mass noted. Findings are favored to represent post cholecystectomy physiology. Pancreas: Unremarkable. No pancreatic ductal dilatation or surrounding inflammatory changes. Spleen: Normal in size without focal abnormality. Adrenals/Urinary Tract: Normal adrenal glands. A few small, less than 1 cm cortical hypodensities are identified in both kidneys which are too small to reliably characterize. The urinary bladder is completely obscured by streak artifact from bilateral hip arthroplasty devices. Stomach/Bowel: Stomach is within normal limits. Appendix not visualized. No evidence of bowel wall thickening, distention, or inflammatory changes. Vascular/Lymphatic: Aortic atherosclerosis. No enlarged abdominal or pelvic lymph nodes. Reproductive: Obscured by artifact. Other: No free fluid or  fluid collections. No signs of peritoneal disease identified. Musculoskeletal: No acute or significant osseous findings. IMPRESSION: 1. Interval progression of radiation change within the left lung. No convincing evidence for residual tumor or metastatic disease identified at this time. 2. The index nodule within the anteromedial left upper lobe is no longer confidently identified and may be obscured by progressive changes due to external beam radiation. 3. Previously characterized ill-defined irregular opacity in the posterior left lower lobe has resolved in the interval with confluent bandlike area of fibrotic changes in its place, likely reflecting changes due to external beam radiation. 4. Coronary artery calcifications noted. Aortic Atherosclerosis (ICD10-I70.0) and Emphysema (ICD10-J43.9). Electronically Signed   By: Kerby Moors M.D.   On: 10/08/2019 15:10   CT Abdomen Pelvis W Contrast  Result Date: 10/08/2019 CLINICAL DATA:  Restaging lung cancer. EXAM: CT CHEST, ABDOMEN, AND PELVIS WITH CONTRAST TECHNIQUE: Multidetector CT imaging of the chest, abdomen and pelvis was performed following the standard protocol during bolus administration of intravenous contrast. CONTRAST:  12mL OMNIPAQUE IOHEXOL 300 MG/ML  SOLN COMPARISON:  July 02, 2019 FINDINGS: CT CHEST FINDINGS Cardiovascular: Normal heart size. No pericardial effusion. Aortic atherosclerosis no aneurysm. Lad and RCA coronary artery calcifications. Mediastinum/Nodes: Normal appearance of the thyroid gland. The trachea appears patent and is midline.  Normal appearance of the esophagus. No enlarged axillary, supraclavicular, mediastinal or hilar adenopathy. Lungs/Pleura: Mild to moderate centrilobular emphysema. No pleural effusion, airspace consolidation, or atelectasis. Paramediastinal and perihilar fibrotic changes are identified within the left lung compatible with changes secondary to external beam radiation. Findings appear progressive when  compared with 07/02/2019. The index nodule within the anteromedial left upper lobe at the level of the aortic arch is no longer confidently identified but may be obscured by progressive changes due to external beam radiation. Index nodule within the right lower lobe measures 3 mm, image 102/14. Unchanged. The new ill-defined irregular opacity in the posterior left lower lobe has resolved in the interval with confluent bandlike area of fibrotic changes in its place, likely reflecting changes due to external beam radiation. Similarly, the previously described sub solid nodular opacity within the superior segment of left lower lobe has resolved. No new or progressive nodularity identified. No convincing evidence for residual tumor identified at this time. Small calcified granuloma is identified within the anterior right upper lobe, image 49/14. Musculoskeletal: No chest wall mass or suspicious bone lesions identified. CT ABDOMEN PELVIS FINDINGS Hepatobiliary: No suspicious liver abnormality. Previous cholecystectomy. Similar appearance of mild to moderate intrahepatic biliary dilatation with increase caliber of the CBD. No obstructing stone or mass noted. Findings are favored to represent post cholecystectomy physiology. Pancreas: Unremarkable. No pancreatic ductal dilatation or surrounding inflammatory changes. Spleen: Normal in size without focal abnormality. Adrenals/Urinary Tract: Normal adrenal glands. A few small, less than 1 cm cortical hypodensities are identified in both kidneys which are too small to reliably characterize. The urinary bladder is completely obscured by streak artifact from bilateral hip arthroplasty devices. Stomach/Bowel: Stomach is within normal limits. Appendix not visualized. No evidence of bowel wall thickening, distention, or inflammatory changes. Vascular/Lymphatic: Aortic atherosclerosis. No enlarged abdominal or pelvic lymph nodes. Reproductive: Obscured by artifact. Other: No free  fluid or fluid collections. No signs of peritoneal disease identified. Musculoskeletal: No acute or significant osseous findings. IMPRESSION: 1. Interval progression of radiation change within the left lung. No convincing evidence for residual tumor or metastatic disease identified at this time. 2. The index nodule within the anteromedial left upper lobe is no longer confidently identified and may be obscured by progressive changes due to external beam radiation. 3. Previously characterized ill-defined irregular opacity in the posterior left lower lobe has resolved in the interval with confluent bandlike area of fibrotic changes in its place, likely reflecting changes due to external beam radiation. 4. Coronary artery calcifications noted. Aortic Atherosclerosis (ICD10-I70.0) and Emphysema (ICD10-J43.9). Electronically Signed   By: Kerby Moors M.D.   On: 10/08/2019 15:10    ASSESSMENT & PLAN:   Malignant neoplasm of upper lobe of left lung ([place]) #Stage III lung cancer-status post chemoradiation currently on maintenance durvalumab.  October 08, 2019-CT scan neck chest and pelvis-shows left parahilar infiltrative changes-suggestive of radiation; otherwise no progressive malignancy noted.  # Proceed with durvalumab today; Labs today reviewed;  acceptable for treatment today.   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   # Arthritis- ? Stem cells injection into the joints.  Recommend bringing more information to the next doctor visit.   # DISPOSITION: #Treatment today #Follow-up in 2 weeks-MD/Dr.Rao; labs-CBC CMP; durvalumab-Dr.B  # I reviewed the blood work- with the patient in detail; also reviewed the imaging independently [as summarized above]; and with the patient in detail.      All questions were answered. The patient knows to call the clinic with  any problems, questions or concerns.       Cammie Sickle, MD 10/18/2019 12:52 PM

## 2019-10-10 NOTE — Assessment & Plan Note (Addendum)
#  Stage III lung cancer-status post chemoradiation currently on maintenance durvalumab.  October 08, 2019-CT scan neck chest and pelvis-shows left parahilar infiltrative changes-suggestive of radiation; otherwise no progressive malignancy noted.  Previously noted left upper lobe nodule-can be seen with radiation changes.  Stable  # Proceed with durvalumab today; Labs today reviewed;  acceptable for treatment today.   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   # Arthritis- ? Stem cells injection into the joints.  Recommend bringing more information to the next doctor visit.   # DISPOSITION: #Treatment today #Follow-up in 2 weeks-MD/Dr.Rao; labs-CBC CMP; durvalumab-Dr.B  # I reviewed the blood work- with the patient in detail; also reviewed the imaging independently [as summarized above]; and with the patient in detail.

## 2019-10-24 ENCOUNTER — Other Ambulatory Visit: Payer: Self-pay

## 2019-10-24 ENCOUNTER — Inpatient Hospital Stay: Payer: Medicare Other | Attending: Oncology

## 2019-10-24 ENCOUNTER — Inpatient Hospital Stay: Payer: Medicare Other

## 2019-10-24 ENCOUNTER — Encounter: Payer: Self-pay | Admitting: Oncology

## 2019-10-24 ENCOUNTER — Inpatient Hospital Stay (HOSPITAL_BASED_OUTPATIENT_CLINIC_OR_DEPARTMENT_OTHER): Payer: Medicare Other | Admitting: Oncology

## 2019-10-24 VITALS — BP 127/70 | HR 64 | Resp 16

## 2019-10-24 VITALS — BP 129/78 | HR 68 | Resp 16 | Wt 157.4 lb

## 2019-10-24 DIAGNOSIS — C3412 Malignant neoplasm of upper lobe, left bronchus or lung: Secondary | ICD-10-CM

## 2019-10-24 DIAGNOSIS — R42 Dizziness and giddiness: Secondary | ICD-10-CM | POA: Insufficient documentation

## 2019-10-24 DIAGNOSIS — R519 Headache, unspecified: Secondary | ICD-10-CM | POA: Diagnosis not present

## 2019-10-24 DIAGNOSIS — Z5112 Encounter for antineoplastic immunotherapy: Secondary | ICD-10-CM

## 2019-10-24 DIAGNOSIS — Z79899 Other long term (current) drug therapy: Secondary | ICD-10-CM | POA: Insufficient documentation

## 2019-10-24 LAB — CBC WITH DIFFERENTIAL/PLATELET
Abs Immature Granulocytes: 0.01 10*3/uL (ref 0.00–0.07)
Basophils Absolute: 0 10*3/uL (ref 0.0–0.1)
Basophils Relative: 1 %
Eosinophils Absolute: 0.1 10*3/uL (ref 0.0–0.5)
Eosinophils Relative: 2 %
HCT: 37.3 % (ref 36.0–46.0)
Hemoglobin: 12.5 g/dL (ref 12.0–15.0)
Immature Granulocytes: 0 %
Lymphocytes Relative: 22 %
Lymphs Abs: 0.7 10*3/uL (ref 0.7–4.0)
MCH: 30.1 pg (ref 26.0–34.0)
MCHC: 33.5 g/dL (ref 30.0–36.0)
MCV: 89.9 fL (ref 80.0–100.0)
Monocytes Absolute: 0.3 10*3/uL (ref 0.1–1.0)
Monocytes Relative: 10 %
Neutro Abs: 2.2 10*3/uL (ref 1.7–7.7)
Neutrophils Relative %: 65 %
Platelets: 198 10*3/uL (ref 150–400)
RBC: 4.15 MIL/uL (ref 3.87–5.11)
RDW: 13.1 % (ref 11.5–15.5)
WBC: 3.4 10*3/uL — ABNORMAL LOW (ref 4.0–10.5)
nRBC: 0 % (ref 0.0–0.2)

## 2019-10-24 LAB — COMPREHENSIVE METABOLIC PANEL
ALT: 17 U/L (ref 0–44)
AST: 19 U/L (ref 15–41)
Albumin: 4.2 g/dL (ref 3.5–5.0)
Alkaline Phosphatase: 61 U/L (ref 38–126)
Anion gap: 7 (ref 5–15)
BUN: 12 mg/dL (ref 8–23)
CO2: 28 mmol/L (ref 22–32)
Calcium: 9.1 mg/dL (ref 8.9–10.3)
Chloride: 104 mmol/L (ref 98–111)
Creatinine, Ser: 0.46 mg/dL (ref 0.44–1.00)
GFR calc Af Amer: >60 mL/min (ref >60)
GFR calc non Af Amer: >60 mL/min (ref >60)
Glucose, Bld: 88 mg/dL (ref 70–99)
Potassium: 4.1 mmol/L (ref 3.5–5.1)
Sodium: 139 mmol/L (ref 135–145)
Total Bilirubin: 0.5 mg/dL (ref 0.3–1.2)
Total Protein: 6.6 g/dL (ref 6.5–8.1)

## 2019-10-24 LAB — TSH: TSH: 2.103 u[IU]/mL (ref 0.350–4.500)

## 2019-10-24 MED ORDER — HEPARIN SOD (PORK) LOCK FLUSH 100 UNIT/ML IV SOLN
INTRAVENOUS | Status: AC
  Filled 2019-10-24: qty 5

## 2019-10-24 MED ORDER — SODIUM CHLORIDE 0.9 % IV SOLN
10.0000 mg/kg | Freq: Once | INTRAVENOUS | Status: AC
  Administered 2019-10-24: 740 mg via INTRAVENOUS
  Filled 2019-10-24: qty 10

## 2019-10-24 MED ORDER — SODIUM CHLORIDE 0.9 % IV SOLN
Freq: Once | INTRAVENOUS | Status: AC
  Filled 2019-10-24: qty 250

## 2019-10-24 MED ORDER — SODIUM CHLORIDE 0.9% FLUSH
10.0000 mL | INTRAVENOUS | Status: DC | PRN
  Administered 2019-10-24: 10 mL via INTRAVENOUS
  Filled 2019-10-24: qty 10

## 2019-10-24 MED ORDER — HEPARIN SOD (PORK) LOCK FLUSH 100 UNIT/ML IV SOLN
500.0000 [IU] | Freq: Once | INTRAVENOUS | Status: AC
  Administered 2019-10-24: 500 [IU] via INTRAVENOUS
  Filled 2019-10-24: qty 5

## 2019-10-24 NOTE — Progress Notes (Signed)
Hematology/Oncology Consult note [facility]  [phone] [phone]  Patient Care Team: [name], MD as PCP - General (Family Medicine) Telford Nab, RN as Registered Nurse   Name of the patient: Jennifer Hodges  [id number]  April 22, 1945   Date of visit: 10/24/19  Diagnosis- Adenocarcinoma of the lung stage III T1b N2 M0  Chief complaint/ Reason for visit-on treatment assessment prior to cycle 14 of maintenance durvalumab  Heme/Onc history: patient is a 74 year old female who was seen by pulmonary and ENT for ongoing symptoms of bronchitis and possible laryngitis and received antibiotics for the same.She underwent CT chest in September 2020 which showed multiple bilateral lung nodules with associated mediastinal adenopathy and possible primary left upper lobe lung mass. This was followed by a PET CT scan which showed a left upper lobe lung mass measuring 1.9 x 1.2 cm with an SUV of 10.7. Conglomerate AP window adenopathy measuring 5.8 x 2.1 cm with an SUV of 14. She was also noted to have hypermetabolic left hilar adenopathy and left supraclavicular 0.7 cm lymph node with an SUV of 4.6. Noted to have a 7 mm right lower lobe lung nodule with faint metabolic activity. No evidence of other distant metastatic disease. This was followed by a CT super D chest without contrast. The right lower lobe lung nodules were somewhat larger as compared to September 2020. 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 Maintenance durvalumab started in January 2021after scan showed partial response.  Interval history-patient continues to have issues with her vertigo and balance for which she sees ENT and will be going for more therapy sooner. Otherwise patient has been doing well and denies any new complaints at this time.  ECOG PS- 1 Pain scale- 0  Review of systems- Review of Systems  Constitutional: Positive for malaise/fatigue. Negative for chills, fever and weight loss.  HENT: Negative for congestion, ear discharge and nosebleeds.   Eyes: Negative for blurred vision.  Respiratory: Negative for cough, hemoptysis, sputum production, shortness of breath and wheezing.   Cardiovascular: Negative for chest pain, palpitations, orthopnea and claudication.  Gastrointestinal: Negative for abdominal pain, blood in stool, constipation, diarrhea, heartburn, melena, nausea and vomiting.  Genitourinary: Negative for dysuria, flank pain, frequency, hematuria and urgency.  Musculoskeletal: Negative for back pain, joint pain and myalgias.  Skin: Negative for rash.  Neurological: Negative for dizziness, tingling, focal weakness, seizures, weakness and headaches.       Vertigo  Endo/Heme/Allergies: Does not bruise/bleed easily.  Psychiatric/Behavioral: Negative for depression and suicidal ideas. The patient does not have insomnia.      Allergies  Allergen Reactions  . Aspirin Hives and Other (See Comments)    Difficulty breathing  . Celebrex [Celecoxib] Shortness Of Breath  . Morphine And Related Nausea And Vomiting and Swelling  . Adhesive [Tape] Other (See Comments)    Took top layer of skin off when removed.  . Clarithromycin Nausea And Vomiting  . Codeine Nausea And Vomiting  . Darvon [Propoxyphene Hcl] Nausea And Vomiting  . Demerol [Meperidine] Nausea And Vomiting  . Flonase [Fluticasone Propionate] Other (See Comments)  Fungal infection  . Simvastatin Other  (See Comments)    "caused ulcers in mouth, and fever"  . Talwin [Pentazocine] Nausea And Vomiting     Past Medical History:  Diagnosis Date  . Anemia   . Asthma    No Inhalers--Dr. [name] will order as needed  . Bronchiectasis (HCC)    mild  . Chronic headaches     followed by [facility] migraines  . COPD (chronic obstructive pulmonary disease) ([place])   . DDD (degenerative disc disease), lumbar   . Diabetes mellitus without complication [facility])    "doctor says I no longer have diabetes"    . Diverticulosis   . Family history of adverse reaction to anesthesia    PONV  . Gall stones    history of  . GERD (gastroesophageal reflux disease)    EGD 8/09- non bleeding erosive gastritis, documentd esophageal ulcerations.   . Hiatal hernia   . History of pneumonia   . Hypercholesterolemia   . IBS (irritable bowel syndrome)   . Malignant neoplasm of upper lobe of left lung ([place]) 12/27/2018  . Murmur   . Osteoarthritis    lumbar disc disease, left hip  . PONV (postoperative nausea and vomiting)    "Only with last hip and I believe it was due to the morphine"   . Sleep apnea    cpap asked to bring mask and tubing  . Vertigo   . Weakness of right side of body      Past Surgical History:  Procedure Laterality Date  . ABDOMINAL HYSTERECTOMY  age 32  . ANTERIOR CERVICAL DECOMP/DISCECTOMY FUSION N/A 02/24/2015   Procedure: CERVICAL FOUR-FIVE, CERVICAL FIVE-SIX, CERVICAL SIX-SEVEN ANTERIOR CERVICAL DECOMPRESSION/DISCECTOMY FUSION ;  Surgeon: [name], MD;  Location: [place] [facility];  Service: Neurosurgery;  Laterality: N/A;  C45 C56 C67 anterior cervical decompression with fusion interbody prosthesis plating and bonegraft  . APPENDECTOMY    . BACK SURGERY     4th lumbar fusion  . BLADDER SURGERY N/A    with vaginal wall repair  . BREAST CYST ASPIRATION Bilateral    neg  . BREAST SURGERY Bilateral    cyst removed and reduction  . CARDIAC CATHETERIZATION  2014  . CARPAL  TUNNEL RELEASE Right 02/11/2016   Procedure: CARPAL TUNNEL RELEASE;  Surgeon: [name], MD;  Location: [facility] ORS;  Service: Orthopedics;  Laterality: Right;  . CATARACT EXTRACTION W/ INTRAOCULAR LENS IMPLANT Bilateral 2015  . CHOLECYSTECTOMY    . EUS N/A 05/31/2012   Procedure: UPPER ENDOSCOPIC ULTRASOUND (EUS) LINEAR;  Surgeon: [name], MD;  Location: [facility];  Service: Endoscopy;  Laterality: N/A;  . EXCISIONAL HEMORRHOIDECTOMY    . JOINT REPLACEMENT Bilateral   . KNEE ARTHROSCOPY WITH LATERAL MENISECTOMY Right 07/07/2015   Procedure: KNEE ARTHROSCOPY WITH LATERAL MENISECTOMY, PARTIAL SYNOVECTOMY;  Surgeon: [name], MD;  Location: [facility] OR[facility];  Service: Orthopedics;  Laterality: Right;  . LUMBAR LAMINECTOMY    . PORTA CATH INSERTION N/A 01/07/2019   Procedure: PORTA CATH INSERTION;  Surgeon: [name], MD;  Location: [facility];  Service: Cardiovascular;  Laterality: N/A;  . REDUCTION MAMMAPLASTY  1990  . RIGHT OOPHORECTOMY    . TOTAL HIP ARTHROPLASTY Left 05/01/2014   Dr. [name]  . TOTAL HIP ARTHROPLASTY Right 08/05/2014   Procedure: TOTAL HIP ARTHROPLASTY ANTERIOR APPROACH;  Surgeon: [name], MD;  Location: [facility] OR[facility];  Service: Orthopedics;  Laterality: Right;  . ULNAR NERVE TRANSPOSITION Right 02/11/2016   Procedure:  ULNAR NERVE DECOMPRESSION/TRANSPOSITION;  Surgeon: [name], MD;  Location: [facility];  Service: Orthopedics;  Laterality: Right;  Marland Kitchen VIDEO BRONCHOSCOPY WITH ENDOBRONCHIAL ULTRASOUND Left 12/19/2018   Procedure: VIDEO BRONCHOSCOPY WITH ENDOBRONCHIAL ULTRASOUND, LEFT, SLEEP APNEA;  Surgeon: [name], MD;  Location: [facility] OR[facility];  Service: Thoracic;  Laterality: Left;    Social History   Socioeconomic History  . Marital status: Married    Spouse name: Not on file  . Number of children: Not on file  . Years of education: Not on file  . Highest education level: Not on file  Occupational History  . Not on file  Tobacco Use  .  Smoking status: Former Smoker    Quit date: 06/13/2007    Years since quitting: 12.3  . Smokeless tobacco: Never Used  Vaping Use  . Vaping Use: Never used  Substance and Sexual Activity  . Alcohol use: No    Alcohol/week: 0.0 standard drinks  . Drug use: No  . Sexual activity: Never  Other Topics Concern  . Not on file  Social History Narrative  . Not on file   Social Determinants of Health   Financial Resource Strain:   . Difficulty of Paying Living Expenses:   Food Insecurity:   . Worried About Charity fundraiser in the Last Year:   . Arboriculturist in the Last Year:   Transportation Needs:   . Film/video editor (Medical):   Marland Kitchen Lack of Transportation (Non-Medical):   Physical Activity:   . Days of Exercise per Week:   . Minutes of Exercise per Session:   Stress:   . Feeling of Stress :   Social Connections:   . Frequency of Communication with Friends and Family:   . Frequency of Social Gatherings with Friends and Family:   . Attends Religious Services:   . Active Member of Clubs or Organizations:   . Attends Archivist Meetings:   Marland Kitchen Marital Status:   Intimate Partner Violence:   . Fear of Current or Ex-Partner:   . Emotionally Abused:   Marland Kitchen Physically Abused:   . Sexually Abused:     Family History  Problem Relation Age of Onset  . Heart disease Mother        s/p stent  . Hypertension Mother   . Hypercholesterolemia Mother   . Diabetes Father   . Stomach cancer Other        uncle  . Breast cancer Cousin   . Breast cancer Paternal Aunt      Current Outpatient Medications:  .  albuterol (PROVENTIL) (2.5 MG/3ML) 0.083% nebulizer solution, Take 2.5 mg by nebulization every 6 (six) hours as needed for wheezing., Disp: , Rfl:  .  albuterol (VENTOLIN HFA) 108 (90 Base) MCG/ACT inhaler, Inhale 2 puffs into the lungs every 6 (six) hours as needed for wheezing or shortness of breath., Disp: 18 g, Rfl: 0 .  Bioflavonoid Products (ESTER C PO), Take 1  tablet by mouth 3 (three) times daily., Disp: , Rfl:  .  Calcium-Magnesium-Zinc (CAL-MAG-ZINC PO), Take 1 tablet by mouth daily., Disp: , Rfl:  .  cetirizine (ZYRTEC) 5 MG tablet, Take 1 tablet (5 mg total) by mouth daily. (Patient taking differently: Take 5 mg by mouth daily as needed (runny nose.). ), Disp: 30 tablet, Rfl: 0 .  CHLORASEPTIC MAX SORE THROAT 1.5-33 % LIQD, Use as directed 1 spray in the mouth or throat as needed (throat irritation.). , Disp: , Rfl:  .  Cholecalciferol (EQL VITAMIN D3) 25 MCG (1000 UT) capsule, Take 4,000 Units by mouth daily., Disp: , Rfl:  .  CHROMIUM PO, Take 1 capsule by mouth daily., Disp: , Rfl:  .  Coenzyme Q10 (CO Q 10) 100 MG CAPS, Take 100 mg by mouth daily. , Disp: , Rfl:  .  Cyanocobalamin (VITAMIN B-12) 2500 MCG SUBL, Place 2,500 mcg under the tongue daily., Disp: , Rfl:  .  cyclobenzaprine (FLEXERIL) 10 MG tablet, Take 10 mg by mouth at bedtime. , Disp: , Rfl:  .  DHEA 25 MG CAPS, Take 25 mg by mouth daily., Disp: , Rfl:  .  Ensure (ENSURE), Take 237 mLs by mouth 2 (two) times daily between meals., Disp: , Rfl:  .  ezetimibe (ZETIA) 10 MG tablet, Take 10 mg by mouth at bedtime., Disp: , Rfl:  .  folic acid (FOLVITE) 790 MCG tablet, Take 400 mcg by mouth 2 (two) times daily. , Disp: , Rfl:  .  Garlic 2409 MG CAPS, Take 1,000 mg by mouth daily., Disp: , Rfl:  .  Histamine Dihydrochloride (AUSTRALIAN DREAM ARTHRITIS EX), Apply 1 application topically 4 (four) times daily as needed (pain.)., Disp: , Rfl:  .  HYDROcodone-acetaminophen (NORCO/VICODIN) 5-325 MG tablet, 1-2 tabs po bid prn (Patient taking differently: Take 1 tablet by mouth at bedtime. ), Disp: 10 tablet, Rfl: 0 .  HYDROcodone-homatropine (HYCODAN) 5-1.5 MG/5ML syrup, Take 5 mLs by mouth 3 (three) times daily as needed for cough., Disp: 120 mL, Rfl: 0 .  ipratropium (ATROVENT) 0.06 % nasal spray, Place 2 sprays into both nostrils 4 (four) times daily as needed for rhinitis., Disp: 15 mL, Rfl:  0 .  lidocaine-prilocaine (EMLA) cream, lidocaine-prilocaine 2.5 %-2.5 % topical cream, Disp: , Rfl:  .  meclizine (ANTIVERT) 25 MG tablet, Take 1 tablet (25 mg total) by mouth every 6 (six) hours as needed for dizziness., Disp: 30 tablet, Rfl: 0 .  montelukast (SINGULAIR) 10 MG tablet, Take 10 mg by mouth at bedtime. , Disp: , Rfl:  .  NON FORMULARY, Take 1 capsule by mouth daily., Disp: , Rfl:  .  ondansetron (ZOFRAN-ODT) 4 MG disintegrating tablet, Take 4 mg by mouth every 8 (eight) hours as needed for nausea/vomiting., Disp: , Rfl:  .  Polyethyl Glycol-Propyl Glycol (SYSTANE) 0.4-0.3 % SOLN, Place 1 drop into both eyes 5 (five) times daily as needed (dry/irritated eyes). , Disp: , Rfl:  .  pyridOXINE (VITAMIN B-6) 100 MG tablet, Take 100 mg by mouth daily., Disp: , Rfl:  .  Rimegepant Sulfate 75 MG TBDP, Take by mouth., Disp: , Rfl:  .  Selenium 200 MCG CAPS, Take 200 mcg by mouth daily., Disp: , Rfl:  .  sucralfate (CARAFATE) 1 g tablet, 1 tablet 3 (three) times daily as needed. , Disp: , Rfl:  .  umeclidinium-vilanterol (ANORO ELLIPTA) 62.5-25 MCG/INH AEPB, Inhale 1 puff into the lungs as needed. , Disp: , Rfl:  .  vitamin E 400 UNIT capsule, Take 400 Units by mouth daily., Disp: , Rfl:  .  Zoledronic Acid (RECLAST IV), Inject 1 Dose into the vein as directed. ONCE A YEAR, Disp: , Rfl:  No current facility-administered medications for this visit.  Facility-Administered Medications Ordered in Other Visits:  .  durvalumab (IMFINZI) 740 mg in sodium chloride 0.9 % 100 mL chemo infusion, 10 mg/kg (Treatment Plan Recorded), Intravenous, Once, Sindy Guadeloupe, MD .  heparin lock flush 100 unit/mL, 500 Units, Intravenous, Once, Sindy Guadeloupe, MD .  sodium chloride flush (NS) 0.9 % injection 10 mL, 10 mL, Intravenous, PRN, Sindy Guadeloupe, MD, 10 mL at 10/24/19 0821  Physical exam:  Vitals:   10/24/19 0842  BP: 129/78  Pulse: 68  Resp: 16  SpO2: 100%  Weight: 157 lb 6.4 oz (71.4 kg)    Physical Exam Constitutional:      General: She is not in acute distress. Cardiovascular:     Rate and Rhythm: Normal rate and regular rhythm.     Heart sounds: Normal heart sounds.  Pulmonary:     Effort: Pulmonary effort is normal.     Breath sounds: Normal breath sounds.  Abdominal:     General: Bowel sounds are normal.     Palpations: Abdomen is soft.  Skin:    General: Skin is warm and dry.  Neurological:     Mental Status: She is alert and oriented to person, place, and time.      CMP Latest Ref Rng & Units 10/24/2019  Glucose 70 - 99 mg/dL 88  BUN 8 - 23 mg/dL 12  Creatinine 0.44 - 1.00 mg/dL 0.46  Sodium 135 - 145 mmol/L 139  Potassium 3.5 - 5.1 mmol/L 4.1  Chloride 98 - 111 mmol/L 104  CO2 22 - 32 mmol/L 28  Calcium 8.9 - 10.3 mg/dL 9.1  Total Protein 6.5 - 8.1 g/dL 6.6  Total Bilirubin 0.3 - 1.2 mg/dL 0.5  Alkaline Phos 38 - 126 U/L 61  AST 15 - 41 U/L 19  ALT 0 - 44 U/L 17   CBC Latest Ref Rng & Units 10/24/2019  WBC 4.0 - 10.5 K/uL 3.4(L)  Hemoglobin 12.0 - 15.0 g/dL 12.5  Hematocrit 36 - 46 % 37.3  Platelets 150 - 400 K/uL 198    No images are attached to the encounter.  CT SOFT TISSUE NECK W CONTRAST  Result Date: 10/08/2019 CLINICAL DATA:  74 year old female restaging lung cancer. Left lateral neck swelling last month. EXAM: CT NECK WITH CONTRAST TECHNIQUE: Multidetector CT imaging of the neck was performed using the standard protocol following the bolus administration of intravenous contrast. CONTRAST:  110m OMNIPAQUE IOHEXOL 300 MG/ML SOLN in conjunction with contrast enhanced imaging of the chest, abdomen, and pelvis reported separately. COMPARISON:  CT Chest, Abdomen, and Pelvis today reported separately. Neck ultrasound 09/05/2019. Brain MRI 05/08/2019. Neck CTA 10/21/2005 FINDINGS: Pharynx and larynx: Laryngeal and pharyngeal soft tissue contours are within normal limits. Negative parapharyngeal and retropharyngeal spaces. Salivary glands: Negative  sublingual space. Submandibular glands and parotid glands are within normal limits. Thyroid: Negative. Lymph nodes: Negative.  No cervical lymphadenopathy. Vascular: Right IJ Port-A-Cath. The major vascular structures in the neck and at the skull base are enhancing and patent. Carotid bifurcation and ICA siphon atherosclerosis. Limited intracranial: Negative. Visualized orbits: Postoperative changes to both globes, otherwise negative. Mastoids and visualized paranasal sinuses: Mild right side posterior ethmoid mucosal thickening. Largely resolved right maxillary sinus mucous retention cysts seen in 2007. Otherwise clear. Skeleton: Extensive prior cervical ACDF from C4-C7. Adjacent segment cervical spine degeneration. TMJ degeneration. No acute or suspicious osseous lesion identified. Upper chest: Reported separately today. IMPRESSION: 1. No metastatic disease identified in the neck. No neck mass or acute finding. 2.  CT Chest, Abdomen, and Pelvis today are reported separately. Electronically Signed   By: HGenevie AnnM.D.   On: 10/08/2019 19:41   CT Chest W Contrast  Result Date: 10/08/2019 CLINICAL DATA:  Restaging lung cancer. EXAM: CT CHEST, ABDOMEN, AND PELVIS WITH CONTRAST TECHNIQUE:  Multidetector CT imaging of the chest, abdomen and pelvis was performed following the standard protocol during bolus administration of intravenous contrast. CONTRAST:  OMNIPAQUE IOHEXOL 300 MG/ML  SOLN COMPARISON:  July 02, 2019 FINDINGS: CT CHEST FINDINGS Cardiovascular: Normal heart size. No pericardial effusion. Aortic atherosclerosis no aneurysm. Lad and RCA coronary artery calcifications. Mediastinum/Nodes: Normal appearance of the thyroid gland. The trachea appears patent and is midline. Normal appearance of the esophagus. No enlarged axillary, supraclavicular, mediastinal or hilar adenopathy. Lungs/Pleura: Mild to moderate centrilobular emphysema. No pleural effusion, airspace consolidation, or atelectasis.  Paramediastinal and perihilar fibrotic changes are identified within the left lung compatible with changes secondary to external beam radiation. Findings appear progressive when compared with 07/02/2019. The index nodule within the anteromedial left upper lobe at the level of the aortic arch is no longer confidently identified but may be obscured by progressive changes due to external beam radiation. Index nodule within the right lower lobe measures 3 mm, image 102/14. Unchanged. The new ill-defined irregular opacity in the posterior left lower lobe has resolved in the interval with confluent bandlike area of fibrotic changes in its place, likely reflecting changes due to external beam radiation. Similarly, the previously described sub solid nodular opacity within the superior segment of left lower lobe has resolved. No new or progressive nodularity identified. No convincing evidence for residual tumor identified at this time. Small calcified granuloma is identified within the anterior right upper lobe, image 49/14. Musculoskeletal: No chest wall mass or suspicious bone lesions identified. CT ABDOMEN PELVIS FINDINGS Hepatobiliary: No suspicious liver abnormality. Previous cholecystectomy. Similar appearance of mild to moderate intrahepatic biliary dilatation with increase caliber of the CBD. No obstructing stone or mass noted. Findings are favored to represent post cholecystectomy physiology. Pancreas: Unremarkable. No pancreatic ductal dilatation or surrounding inflammatory changes. Spleen: Normal in size without focal abnormality. Adrenals/Urinary Tract: Normal adrenal glands. A few small, less than 1 cm cortical hypodensities are identified in both kidneys which are too small to reliably characterize. The urinary bladder is completely obscured by streak artifact from bilateral hip arthroplasty devices. Stomach/Bowel: Stomach is within normal limits. Appendix not visualized. No evidence of bowel wall thickening,  distention, or inflammatory changes. Vascular/Lymphatic: Aortic atherosclerosis. No enlarged abdominal or pelvic lymph nodes. Reproductive: Obscured by artifact. Other: No free fluid or fluid collections. No signs of peritoneal disease identified. Musculoskeletal: No acute or significant osseous findings. IMPRESSION: 1. Interval progression of radiation change within the left lung. No convincing evidence for residual tumor or metastatic disease identified at this time. 2. The index nodule within the anteromedial left upper lobe is no longer confidently identified and may be obscured by progressive changes due to external beam radiation. 3. Previously characterized ill-defined irregular opacity in the posterior left lower lobe has resolved in the interval with confluent bandlike area of fibrotic changes in its place, likely reflecting changes due to external beam radiation. 4. Coronary artery calcifications noted. Aortic Atherosclerosis (ICD10-I70.0) and Emphysema (ICD10-J43.9). Electronically Signed   By: Signa Kell M.D.   On: 10/08/2019 15:10   CT Abdomen Pelvis W Contrast  Result Date: 10/08/2019 CLINICAL DATA:  Restaging lung cancer. EXAM: CT CHEST, ABDOMEN, AND PELVIS WITH CONTRAST TECHNIQUE: Multidetector CT imaging of the chest, abdomen and pelvis was performed following the standard protocol during bolus administration of intravenous contrast. CONTRAST:  OMNIPAQUE IOHEXOL 300 MG/ML  SOLN COMPARISON:  July 02, 2019 FINDINGS: CT CHEST FINDINGS Cardiovascular: Normal heart size. No pericardial effusion. Aortic atherosclerosis no aneurysm. Lad and  RCA coronary artery calcifications. Mediastinum/Nodes: Normal appearance of the thyroid gland. The trachea appears patent and is midline. Normal appearance of the esophagus. No enlarged axillary, supraclavicular, mediastinal or hilar adenopathy. Lungs/Pleura: Mild to moderate centrilobular emphysema. No pleural effusion, airspace consolidation, or  atelectasis. Paramediastinal and perihilar fibrotic changes are identified within the left lung compatible with changes secondary to external beam radiation. Findings appear progressive when compared with 07/02/2019. The index nodule within the anteromedial left upper lobe at the level of the aortic arch is no longer confidently identified but may be obscured by progressive changes due to external beam radiation. Index nodule within the right lower lobe measures 3 mm, image 102/14. Unchanged. The new ill-defined irregular opacity in the posterior left lower lobe has resolved in the interval with confluent bandlike area of fibrotic changes in its place, likely reflecting changes due to external beam radiation. Similarly, the previously described sub solid nodular opacity within the superior segment of left lower lobe has resolved. No new or progressive nodularity identified. No convincing evidence for residual tumor identified at this time. Small calcified granuloma is identified within the anterior right upper lobe, image 49/14. Musculoskeletal: No chest wall mass or suspicious bone lesions identified. CT ABDOMEN PELVIS FINDINGS Hepatobiliary: No suspicious liver abnormality. Previous cholecystectomy. Similar appearance of mild to moderate intrahepatic biliary dilatation with increase caliber of the CBD. No obstructing stone or mass noted. Findings are favored to represent post cholecystectomy physiology. Pancreas: Unremarkable. No pancreatic ductal dilatation or surrounding inflammatory changes. Spleen: Normal in size without focal abnormality. Adrenals/Urinary Tract: Normal adrenal glands. A few small, less than 1 cm cortical hypodensities are identified in both kidneys which are too small to reliably characterize. The urinary bladder is completely obscured by streak artifact from bilateral hip arthroplasty devices. Stomach/Bowel: Stomach is within normal limits. Appendix not visualized. No evidence of bowel wall  thickening, distention, or inflammatory changes. Vascular/Lymphatic: Aortic atherosclerosis. No enlarged abdominal or pelvic lymph nodes. Reproductive: Obscured by artifact. Other: No free fluid or fluid collections. No signs of peritoneal disease identified. Musculoskeletal: No acute or significant osseous findings. IMPRESSION: 1. Interval progression of radiation change within the left lung. No convincing evidence for residual tumor or metastatic disease identified at this time. 2. The index nodule within the anteromedial left upper lobe is no longer confidently identified and may be obscured by progressive changes due to external beam radiation. 3. Previously characterized ill-defined irregular opacity in the posterior left lower lobe has resolved in the interval with confluent bandlike area of fibrotic changes in its place, likely reflecting changes due to external beam radiation. 4. Coronary artery calcifications noted. Aortic Atherosclerosis (ICD10-I70.0) and Emphysema (ICD10-J43.9). Electronically Signed   By: Kerby Moors M.D.   On: 10/08/2019 15:10     Assessment and plan- Patient is a 74 y.o. female with stage III adenocarcinoma of the lung.   She is here for on treatment assessment prior to cycle 14 of maintenance durvalumab  I have reviewed CT chest abdomen pelvis images independently and discussed findings with the patient. Currently she has no evidence of recurrent or progressive disease She has progressive changes of radiation involving the left lung. Patient was previously complaining of neck swelling and throat pain and also had a CT soft tissue neck done which did not show any acute pathology. Patient will continue maintenance durvalumab until early January 2022.  Counts okay to proceed with cycle 14 of maintenance durvalumab today and I will see her back in 2 weeks for cycle  15. TSH continues to be normal   Visit Diagnosis 1. Encounter for antineoplastic immunotherapy   2. Malignant  neoplasm of upper lobe of left lung ([place])      Dr. Randa Evens, MD, MPH [facility] at [facility] [phone] 10/24/2019 9:43 AM

## 2019-11-07 ENCOUNTER — Inpatient Hospital Stay: Payer: Medicare Other

## 2019-11-07 ENCOUNTER — Encounter: Payer: Self-pay | Admitting: Oncology

## 2019-11-07 ENCOUNTER — Inpatient Hospital Stay (HOSPITAL_BASED_OUTPATIENT_CLINIC_OR_DEPARTMENT_OTHER): Payer: Medicare Other | Admitting: Oncology

## 2019-11-07 ENCOUNTER — Other Ambulatory Visit: Payer: Self-pay

## 2019-11-07 VITALS — BP 101/64 | HR 66 | Resp 16

## 2019-11-07 VITALS — BP 112/58 | HR 73 | Temp 96.0°F | Resp 16 | Wt 159.7 lb

## 2019-11-07 DIAGNOSIS — C3412 Malignant neoplasm of upper lobe, left bronchus or lung: Secondary | ICD-10-CM

## 2019-11-07 DIAGNOSIS — R42 Dizziness and giddiness: Secondary | ICD-10-CM | POA: Diagnosis not present

## 2019-11-07 DIAGNOSIS — Z5112 Encounter for antineoplastic immunotherapy: Secondary | ICD-10-CM

## 2019-11-07 LAB — COMPREHENSIVE METABOLIC PANEL
ALT: 15 U/L (ref 0–44)
AST: 19 U/L (ref 15–41)
Albumin: 3.9 g/dL (ref 3.5–5.0)
Alkaline Phosphatase: 55 U/L (ref 38–126)
Anion gap: 7 (ref 5–15)
BUN: 12 mg/dL (ref 8–23)
CO2: 27 mmol/L (ref 22–32)
Calcium: 8.6 mg/dL — ABNORMAL LOW (ref 8.9–10.3)
Chloride: 105 mmol/L (ref 98–111)
Creatinine, Ser: 0.44 mg/dL (ref 0.44–1.00)
GFR calc Af Amer: >60 mL/min (ref >60)
GFR calc non Af Amer: >60 mL/min (ref >60)
Glucose, Bld: 104 mg/dL — ABNORMAL HIGH (ref 70–99)
Potassium: 3.9 mmol/L (ref 3.5–5.1)
Sodium: 139 mmol/L (ref 135–145)
Total Bilirubin: 0.4 mg/dL (ref 0.3–1.2)
Total Protein: 6.3 g/dL — ABNORMAL LOW (ref 6.5–8.1)

## 2019-11-07 LAB — CBC WITH DIFFERENTIAL/PLATELET
Abs Immature Granulocytes: 0.02 10*3/uL (ref 0.00–0.07)
Basophils Absolute: 0 10*3/uL (ref 0.0–0.1)
Basophils Relative: 1 %
Eosinophils Absolute: 0.1 10*3/uL (ref 0.0–0.5)
Eosinophils Relative: 2 %
HCT: 34.3 % — ABNORMAL LOW (ref 36.0–46.0)
Hemoglobin: 11.7 g/dL — ABNORMAL LOW (ref 12.0–15.0)
Immature Granulocytes: 1 %
Lymphocytes Relative: 16 %
Lymphs Abs: 0.6 10*3/uL — ABNORMAL LOW (ref 0.7–4.0)
MCH: 30.6 pg (ref 26.0–34.0)
MCHC: 34.1 g/dL (ref 30.0–36.0)
MCV: 89.8 fL (ref 80.0–100.0)
Monocytes Absolute: 0.3 10*3/uL (ref 0.1–1.0)
Monocytes Relative: 8 %
Neutro Abs: 2.6 10*3/uL (ref 1.7–7.7)
Neutrophils Relative %: 72 %
Platelets: 197 10*3/uL (ref 150–400)
RBC: 3.82 MIL/uL — ABNORMAL LOW (ref 3.87–5.11)
RDW: 13.3 % (ref 11.5–15.5)
WBC: 3.6 10*3/uL — ABNORMAL LOW (ref 4.0–10.5)
nRBC: 0 % (ref 0.0–0.2)

## 2019-11-07 MED ORDER — SODIUM CHLORIDE 0.9 % IV SOLN
10.0000 mg/kg | Freq: Once | INTRAVENOUS | Status: AC
  Administered 2019-11-07: 740 mg via INTRAVENOUS
  Filled 2019-11-07: qty 10

## 2019-11-07 MED ORDER — HEPARIN SOD (PORK) LOCK FLUSH 100 UNIT/ML IV SOLN
INTRAVENOUS | Status: AC
  Filled 2019-11-07: qty 5

## 2019-11-07 MED ORDER — HEPARIN SOD (PORK) LOCK FLUSH 100 UNIT/ML IV SOLN
500.0000 [IU] | Freq: Once | INTRAVENOUS | Status: AC | PRN
  Administered 2019-11-07: 500 [IU]
  Filled 2019-11-07: qty 5

## 2019-11-07 MED ORDER — SODIUM CHLORIDE 0.9 % IV SOLN
Freq: Once | INTRAVENOUS | Status: AC
  Filled 2019-11-07: qty 250

## 2019-11-07 NOTE — Progress Notes (Signed)
Hematology/Oncology Consult note [facility]  [phone] [phone]  Patient Care Team: [name], MD as PCP - General (Family Medicine) Telford Nab, RN as Registered Nurse   Name of the patient: Jennifer Hodges  [id number]  10-26-1945   Date of visit: 11/07/19  Diagnosis- Adenocarcinoma of the lung stage III T1b N2 M0  Chief complaint/ Reason for visit-on treatment assessment prior to next cycle of maintenance durvalumab  Heme/Onc history: patient is a 74 year old female who was seen by pulmonary and ENT for ongoing symptoms of bronchitis and possible laryngitis and received antibiotics for the same.She underwent CT chest in September 2020 which showed multiple bilateral lung nodules with associated mediastinal adenopathy and possible primary left upper lobe lung mass. This was followed by a PET CT scan which showed a left upper lobe lung mass measuring 1.9 x 1.2 cm with an SUV of 10.7. Conglomerate AP window adenopathy measuring 5.8 x 2.1 cm with an SUV of 14. She was also noted to have hypermetabolic left hilar adenopathy and left supraclavicular 0.7 cm lymph node with an SUV of 4.6. Noted to have a 7 mm right lower lobe lung nodule with faint metabolic activity. No evidence of other distant metastatic disease. This was followed by a CT super D chest without contrast. The right lower lobe lung nodules were somewhat larger as compared to September 2020. 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 Maintenance durvalumab started in January 2021after scan showed partial response.  Interval history-patient reports having trouble with her balance and on and off headaches which have gradually worsened in the last 2 weeks.  She continues to see ENT for her vertigo and is undergoing physical therapy for balance.  Otherwise she is doing well and denies any other complaints at this time  ECOG PS- 1 Pain scale- 0   Review of systems- Review of Systems  Constitutional: Negative for chills, fever, malaise/fatigue and weight loss.  HENT: Negative for congestion, ear discharge and nosebleeds.   Eyes: Negative for blurred vision.  Respiratory: Negative for cough, hemoptysis, sputum production, shortness of breath and wheezing.   Cardiovascular: Negative for chest pain, palpitations, orthopnea and claudication.  Gastrointestinal: Negative for abdominal pain, blood in stool, constipation, diarrhea, heartburn, melena, nausea and vomiting.  Genitourinary: Negative for dysuria, flank pain, frequency, hematuria and urgency.  Musculoskeletal: Negative for back pain, joint pain and myalgias.  Skin: Negative for rash.  Neurological: Positive for headaches. Negative for dizziness, tingling, focal weakness, seizures and weakness.       Gait imbalance  Endo/Heme/Allergies: Does not bruise/bleed easily.  Psychiatric/Behavioral: Negative for depression and suicidal ideas. The patient does not have insomnia.       Allergies  Allergen Reactions  . Aspirin Hives and Other (See Comments)    Difficulty breathing  . Celebrex [Celecoxib] Shortness Of Breath  . Morphine And Related Nausea And Vomiting and Swelling  . Adhesive [Tape] Other (See Comments)    Took top layer of skin off when removed.  . Clarithromycin Nausea And Vomiting  . Codeine Nausea And Vomiting  . Darvon [Propoxyphene Hcl] Nausea And Vomiting  .  Demerol [Meperidine] Nausea And Vomiting  . Flonase  [Fluticasone Propionate] Other (See Comments)    Fungal infection  . Simvastatin Other (See Comments)    "caused ulcers in mouth, and fever"  . Talwin [Pentazocine] Nausea And Vomiting     Past Medical History:  Diagnosis Date  . Anemia   . Asthma    No Inhalers--Dr. [name] will order as needed  . Bronchiectasis (HCC)    mild  . Chronic headaches     followed by [facility] migraines  . COPD (chronic obstructive pulmonary disease) ([place])   . DDD (degenerative disc disease), lumbar   . Diabetes mellitus without complication [facility])    "doctor says I no longer have diabetes"    . Diverticulosis   . Family history of adverse reaction to anesthesia    PONV  . Gall stones    history of  . GERD (gastroesophageal reflux disease)    EGD 8/09- non bleeding erosive gastritis, documentd esophageal ulcerations.   . Hiatal hernia   . History of pneumonia   . Hypercholesterolemia   . IBS (irritable bowel syndrome)   . Malignant neoplasm of upper lobe of left lung ([place]) 12/27/2018  . Murmur   . Osteoarthritis    lumbar disc disease, left hip  . PONV (postoperative nausea and vomiting)    "Only with last hip and I believe it was due to the morphine"   . Sleep apnea    cpap asked to bring mask and tubing  . Vertigo   . Weakness of right side of body      Past Surgical History:  Procedure Laterality Date  . ABDOMINAL HYSTERECTOMY  age 86  . ANTERIOR CERVICAL DECOMP/DISCECTOMY FUSION N/A 02/24/2015   Procedure: CERVICAL FOUR-FIVE, CERVICAL FIVE-SIX, CERVICAL SIX-SEVEN ANTERIOR CERVICAL DECOMPRESSION/DISCECTOMY FUSION ;  Surgeon: [name], MD;  Location: [place] NEUR[facility] ORS;  Service: Neurosurgery;  Laterality: N/A;  C45 C56 C67 anterior cervical decompression with fusion interbody prosthesis plating and bonegraft  . APPENDECTOMY    . BACK SURGERY     4th lumbar fusion  . BLADDER SURGERY N/A    with vaginal wall repair  . BREAST CYST ASPIRATION Bilateral    neg  . BREAST  SURGERY Bilateral    cyst removed and reduction  . CARDIAC CATHETERIZATION  2014  . CARPAL TUNNEL RELEASE Right 02/11/2016   Procedure: CARPAL TUNNEL RELEASE;  Surgeon: [name], MD;  Location: [facility] ORS;  Service: Orthopedics;  Laterality: Right;  . CATARACT EXTRACTION W/ INTRAOCULAR LENS IMPLANT Bilateral 2015  . CHOLECYSTECTOMY    . EUS N/A 05/31/2012   Procedure: UPPER ENDOSCOPIC ULTRASOUND (EUS) LINEAR;  Surgeon: [name], MD;  Location: [facility] ENDOSCOPY;  Service: Endoscopy;  Laterality: N/A;  . EXCISIONAL HEMORRHOIDECTOMY    . JOINT REPLACEMENT Bilateral   . KNEE ARTHROSCOPY WITH LATERAL MENISECTOMY Right 07/07/2015   Procedure: KNEE ARTHROSCOPY WITH LATERAL MENISECTOMY, PARTIAL SYNOVECTOMY;  Surgeon: [name], MD;  Location: [facility] ORS;  Service: Orthopedics;  Laterality: Right;  . LUMBAR LAMINECTOMY    . PORTA CATH INSERTION N/A 01/07/2019   Procedure: PORTA CATH INSERTION;  Surgeon: [name], MD;  Location: [place] CV LAB;  Service: Cardiovascular;  Laterality: N/A;  . REDUCTION MAMMAPLASTY  1990  . RIGHT OOPHORECTOMY    . TOTAL HIP ARTHROPLASTY Left 05/01/2014   Dr. [name]  . TOTAL HIP ARTHROPLASTY Right 08/05/2014   Procedure: TOTAL HIP ARTHROPLASTY ANTERIOR APPROACH;  Surgeon: [name], MD;  Location: [facility] ORS;  Service: Orthopedics;  Laterality: Right;  . ULNAR NERVE TRANSPOSITION Right 02/11/2016   Procedure: ULNAR NERVE DECOMPRESSION/TRANSPOSITION;  Surgeon: [name], MD;  Location: [facility] ORS;  Service: Orthopedics;  Laterality: Right;  Marland Kitchen VIDEO BRONCHOSCOPY WITH ENDOBRONCHIAL ULTRASOUND Left 12/19/2018   Procedure: VIDEO BRONCHOSCOPY WITH ENDOBRONCHIAL ULTRASOUND, LEFT, SLEEP APNEA;  Surgeon: [name], MD;  Location: [facility] ORS;  Service: Thoracic;  Laterality: Left;    Social History   Socioeconomic History  . Marital status: Married    Spouse name: Not on file  . Number of children: Not on file  . Years of education: Not on file  .  Highest education level: Not on file  Occupational History  . Not on file  Tobacco Use  . Smoking status: Former Smoker    Quit date: 06/13/2007    Years since quitting: 12.4  . Smokeless tobacco: Never Used  Vaping Use  . Vaping Use: Never used  Substance and Sexual Activity  . Alcohol use: No    Alcohol/week: 0.0 standard drinks  . Drug use: No  . Sexual activity: Never  Other Topics Concern  . Not on file  Social History Narrative  . Not on file   Social Determinants of Health   Financial Resource Strain:   . Difficulty of Paying Living Expenses: Not on file  Food Insecurity:   . Worried About Charity fundraiser in the Last Year: Not on file  . Ran Out of Food in the Last Year: Not on file  Transportation Needs:   . Lack of Transportation (Medical): Not on file  . Lack of Transportation (Non-Medical): Not on file  Physical Activity:   . Days of Exercise per Week: Not on file  . Minutes of Exercise per Session: Not on file  Stress:   . Feeling of Stress : Not on file  Social Connections:   . Frequency of Communication with Friends and Family: Not on file  . Frequency of Social Gatherings with Friends and Family: Not on file  . Attends Religious Services: Not on file  . Active Member of Clubs or Organizations: Not on file  . Attends Archivist Meetings: Not on file  . Marital Status: Not on file  Intimate Partner Violence:   . Fear of Current or Ex-Partner: Not on file  . Emotionally Abused: Not on file  . Physically Abused: Not on file  . Sexually Abused: Not on file    Family History  Problem Relation Age of Onset  . Heart disease Mother        s/p stent  . Hypertension Mother   . Hypercholesterolemia Mother   . Diabetes Father   . Stomach cancer Other        uncle  . Breast cancer Cousin   . Breast cancer Paternal Aunt      Current Outpatient Medications:  .  albuterol (PROVENTIL) (2.5 MG/3ML) 0.083% nebulizer solution, Take 2.5 mg by  nebulization every 6 (six) hours as needed for wheezing., Disp: , Rfl:  .  albuterol (VENTOLIN HFA) 108 (90 Base) MCG/ACT inhaler, Inhale 2 puffs into the lungs every 6 (six) hours as needed for wheezing or shortness of breath., Disp: 18 g, Rfl: 0 .  Bioflavonoid Products (ESTER C PO), Take 1 tablet by mouth 3 (three) times daily., Disp: , Rfl:  .  Calcium-Magnesium-Zinc (CAL-MAG-ZINC PO), Take 1 tablet by mouth daily., Disp: , Rfl:  .  cetirizine (ZYRTEC) 5 MG tablet, Take 1 tablet (5 mg total)  by mouth daily. (Patient taking differently: Take 5 mg by mouth daily as needed (runny nose.). ), Disp: 30 tablet, Rfl: 0 .  CHLORASEPTIC MAX SORE THROAT 1.5-33 % LIQD, Use as directed 1 spray in the mouth or throat as needed (throat irritation.). , Disp: , Rfl:  .  Cholecalciferol (EQL VITAMIN D3) 25 MCG (1000 UT) capsule, Take 4,000 Units by mouth daily., Disp: , Rfl:  .  CHROMIUM PO, Take 1 capsule by mouth daily., Disp: , Rfl:  .  Coenzyme Q10 (CO Q 10) 100 MG CAPS, Take 100 mg by mouth daily. , Disp: , Rfl:  .  Cyanocobalamin (VITAMIN B-12) 2500 MCG SUBL, Place 2,500 mcg under the tongue daily., Disp: , Rfl:  .  cyclobenzaprine (FLEXERIL) 10 MG tablet, Take 10 mg by mouth at bedtime. , Disp: , Rfl:  .  DHEA 25 MG CAPS, Take 25 mg by mouth daily., Disp: , Rfl:  .  Ensure (ENSURE), Take 237 mLs by mouth 2 (two) times daily between meals., Disp: , Rfl:  .  ezetimibe (ZETIA) 10 MG tablet, Take 10 mg by mouth at bedtime., Disp: , Rfl:  .  folic acid (FOLVITE) 696 MCG tablet, Take 400 mcg by mouth 2 (two) times daily. , Disp: , Rfl:  .  Garlic 2952 MG CAPS, Take 1,000 mg by mouth daily., Disp: , Rfl:  .  Histamine Dihydrochloride (AUSTRALIAN DREAM ARTHRITIS EX), Apply 1 application topically 4 (four) times daily as needed (pain.)., Disp: , Rfl:  .  HYDROcodone-acetaminophen (NORCO/VICODIN) 5-325 MG tablet, 1-2 tabs po bid prn (Patient taking differently: Take 1 tablet by mouth at bedtime. ), Disp: 10 tablet,  Rfl: 0 .  HYDROcodone-homatropine (HYCODAN) 5-1.5 MG/5ML syrup, Take 5 mLs by mouth 3 (three) times daily as needed for cough., Disp: 120 mL, Rfl: 0 .  lidocaine-prilocaine (EMLA) cream, lidocaine-prilocaine 2.5 %-2.5 % topical cream, Disp: , Rfl:  .  meclizine (ANTIVERT) 25 MG tablet, Take 1 tablet (25 mg total) by mouth every 6 (six) hours as needed for dizziness., Disp: 30 tablet, Rfl: 0 .  Mouthwash Compounding Base (MOUTHWASH-AF PO), mouthwash  SWISH AND SWALLOW 1-2 TEASPOONFUL BY MOUTH FOUR TIMES DAILY, Disp: , Rfl:  .  ondansetron (ZOFRAN-ODT) 4 MG disintegrating tablet, Take 4 mg by mouth every 8 (eight) hours as needed for nausea/vomiting., Disp: , Rfl:  .  Polyethyl Glycol-Propyl Glycol (SYSTANE) 0.4-0.3 % SOLN, Place 1 drop into both eyes 5 (five) times daily as needed (dry/irritated eyes). , Disp: , Rfl:  .  pyridOXINE (VITAMIN B-6) 100 MG tablet, Take 100 mg by mouth daily., Disp: , Rfl:  .  Selenium 200 MCG CAPS, Take 200 mcg by mouth daily., Disp: , Rfl:  .  STIOLTO RESPIMAT 2.5-2.5 MCG/ACT AERS, SMARTSIG:2 Puff(s) Via Inhaler Daily, Disp: , Rfl:  .  sucralfate (CARAFATE) 1 g tablet, 1 tablet 3 (three) times daily as needed. , Disp: , Rfl:  .  Vitamin A 2250 MCG (7500 UT) CAPS, Take by mouth., Disp: , Rfl:  .  vitamin E 400 UNIT capsule, Take 400 Units by mouth daily., Disp: , Rfl:  .  Zoledronic Acid (RECLAST IV), Inject 1 Dose into the vein as directed. ONCE A YEAR, Disp: , Rfl:  .  ipratropium (ATROVENT) 0.06 % nasal spray, Place 2 sprays into both nostrils 4 (four) times daily as needed for rhinitis. (Patient not taking: Reported on 11/07/2019), Disp: 15 mL, Rfl: 0 .  montelukast (SINGULAIR) 10 MG tablet, Take 10 mg by mouth  at bedtime.  (Patient not taking: Reported on 11/07/2019), Disp: , Rfl:  .  NON FORMULARY, Take 1 capsule by mouth daily. (Patient not taking: Reported on 11/07/2019), Disp: , Rfl:  .  Rimegepant Sulfate 75 MG TBDP, Take by mouth. (Patient not taking: Reported on  11/07/2019), Disp: , Rfl:  .  umeclidinium-vilanterol (ANORO ELLIPTA) 62.5-25 MCG/INH AEPB, Inhale 1 puff into the lungs as needed.  (Patient not taking: Reported on 11/07/2019), Disp: , Rfl:  .  Zoster Vaccine Adjuvanted [facility]) injection, Shingrix (PF) 50 mcg/0.5 mL intramuscular suspension, kit  ADM 0.5ML IM UTD (Patient not taking: Reported on 11/07/2019), Disp: , Rfl:   Physical exam:  Vitals:   11/07/19 1027  BP: (!) 112/58  Pulse: 73  Resp: 16  Temp: (!) 96 F (35.6 C)  TempSrc: Tympanic  SpO2: 99%  Weight: 159 lb 11.2 oz (72.4 kg)   Physical Exam Constitutional:      General: She is not in acute distress. Cardiovascular:     Rate and Rhythm: Normal rate and regular rhythm.     Heart sounds: Normal heart sounds.  Pulmonary:     Effort: Pulmonary effort is normal.     Breath sounds: Normal breath sounds.  Abdominal:     General: Bowel sounds are normal.     Palpations: Abdomen is soft.  Skin:    General: Skin is warm and dry.  Neurological:     General: No focal deficit present.     Mental Status: She is alert and oriented to person, place, and time.      CMP Latest Ref Rng & Units 11/07/2019  Glucose 70 - 99 mg/dL 104(H)  BUN 8 - 23 mg/dL 12  Creatinine 0.44 - 1.00 mg/dL 0.44  Sodium 135 - 145 mmol/L 139  Potassium 3.5 - 5.1 mmol/L 3.9  Chloride 98 - 111 mmol/L 105  CO2 22 - 32 mmol/L 27  Calcium 8.9 - 10.3 mg/dL 8.6(L)  Total Protein 6.5 - 8.1 g/dL 6.3(L)  Total Bilirubin 0.3 - 1.2 mg/dL 0.4  Alkaline Phos 38 - 126 U/L 55  AST 15 - 41 U/L 19  ALT 0 - 44 U/L 15   CBC Latest Ref Rng & Units 11/07/2019  WBC 4.0 - 10.5 K/uL 3.6(L)  Hemoglobin 12.0 - 15.0 g/dL 11.7(L)  Hematocrit 36 - 46 % 34.3(L)  Platelets 150 - 400 K/uL 197      Assessment and plan- Patient is a 74 y.o. female with stage III adenocarcinoma of the lung.She is here for on treatment assessment prior to cycle 15 of maintenance durvalumab  Counts okay to proceed with cycle 15 of  maintenance durvalumab today.  I will see her back in 2 weeks for cycle 16.  Plan is to continue maintenance durvalumab for a year ending in January 2022.  Gait imbalance/headaches: Possibly secondary to ongoing chronic vertigo.  She is seeing ENT for this.  If symptoms persist after 2 weeks I will consider getting an MRI brain.We had done MRI brain for similar symptoms back in February 2021 which did not reveal any evidence of metastatic disease.   Visit Diagnosis 1. Encounter for antineoplastic immunotherapy   2. Vertigo   3. Malignant neoplasm of upper lobe of left lung ([place])      Dr. Randa Evens, MD, MPH [facility] at [facility] [phone] 11/07/2019 3:01 PM

## 2019-11-08 ENCOUNTER — Ambulatory Visit: Payer: Medicare Other

## 2019-11-19 ENCOUNTER — Other Ambulatory Visit: Payer: Self-pay | Admitting: Family Medicine

## 2019-11-19 DIAGNOSIS — Z1231 Encounter for screening mammogram for malignant neoplasm of breast: Secondary | ICD-10-CM

## 2019-11-21 ENCOUNTER — Inpatient Hospital Stay: Payer: Medicare Other

## 2019-11-21 ENCOUNTER — Inpatient Hospital Stay (HOSPITAL_BASED_OUTPATIENT_CLINIC_OR_DEPARTMENT_OTHER): Payer: Medicare Other | Admitting: Oncology

## 2019-11-21 ENCOUNTER — Other Ambulatory Visit: Payer: Self-pay

## 2019-11-21 ENCOUNTER — Inpatient Hospital Stay: Payer: Medicare Other | Attending: Oncology

## 2019-11-21 VITALS — BP 112/69 | HR 76 | Temp 98.7°F | Resp 16 | Ht 62.0 in | Wt 162.7 lb

## 2019-11-21 DIAGNOSIS — D519 Vitamin B12 deficiency anemia, unspecified: Secondary | ICD-10-CM | POA: Insufficient documentation

## 2019-11-21 DIAGNOSIS — D72819 Decreased white blood cell count, unspecified: Secondary | ICD-10-CM | POA: Insufficient documentation

## 2019-11-21 DIAGNOSIS — R42 Dizziness and giddiness: Secondary | ICD-10-CM | POA: Insufficient documentation

## 2019-11-21 DIAGNOSIS — D6481 Anemia due to antineoplastic chemotherapy: Secondary | ICD-10-CM | POA: Diagnosis not present

## 2019-11-21 DIAGNOSIS — C3412 Malignant neoplasm of upper lobe, left bronchus or lung: Secondary | ICD-10-CM | POA: Insufficient documentation

## 2019-11-21 DIAGNOSIS — Z5112 Encounter for antineoplastic immunotherapy: Secondary | ICD-10-CM | POA: Insufficient documentation

## 2019-11-21 LAB — COMPREHENSIVE METABOLIC PANEL
ALT: 16 U/L (ref 0–44)
AST: 21 U/L (ref 15–41)
Albumin: 4 g/dL (ref 3.5–5.0)
Alkaline Phosphatase: 65 U/L (ref 38–126)
Anion gap: 8 (ref 5–15)
BUN: 10 mg/dL (ref 8–23)
CO2: 28 mmol/L (ref 22–32)
Calcium: 8.8 mg/dL — ABNORMAL LOW (ref 8.9–10.3)
Chloride: 103 mmol/L (ref 98–111)
Creatinine, Ser: 0.5 mg/dL (ref 0.44–1.00)
GFR calc Af Amer: >60 mL/min (ref >60)
GFR calc non Af Amer: >60 mL/min (ref >60)
Glucose, Bld: 89 mg/dL (ref 70–99)
Potassium: 4.1 mmol/L (ref 3.5–5.1)
Sodium: 139 mmol/L (ref 135–145)
Total Bilirubin: 0.4 mg/dL (ref 0.3–1.2)
Total Protein: 6.4 g/dL — ABNORMAL LOW (ref 6.5–8.1)

## 2019-11-21 LAB — CBC WITH DIFFERENTIAL/PLATELET
Abs Immature Granulocytes: 0.01 10*3/uL (ref 0.00–0.07)
Basophils Absolute: 0 10*3/uL (ref 0.0–0.1)
Basophils Relative: 1 %
Eosinophils Absolute: 0.1 10*3/uL (ref 0.0–0.5)
Eosinophils Relative: 2 %
HCT: 34.4 % — ABNORMAL LOW (ref 36.0–46.0)
Hemoglobin: 11.7 g/dL — ABNORMAL LOW (ref 12.0–15.0)
Immature Granulocytes: 0 %
Lymphocytes Relative: 19 %
Lymphs Abs: 0.6 10*3/uL — ABNORMAL LOW (ref 0.7–4.0)
MCH: 30.9 pg (ref 26.0–34.0)
MCHC: 34 g/dL (ref 30.0–36.0)
MCV: 90.8 fL (ref 80.0–100.0)
Monocytes Absolute: 0.3 10*3/uL (ref 0.1–1.0)
Monocytes Relative: 9 %
Neutro Abs: 2.4 10*3/uL (ref 1.7–7.7)
Neutrophils Relative %: 69 %
Platelets: 208 10*3/uL (ref 150–400)
RBC: 3.79 MIL/uL — ABNORMAL LOW (ref 3.87–5.11)
RDW: 13.3 % (ref 11.5–15.5)
WBC: 3.4 10*3/uL — ABNORMAL LOW (ref 4.0–10.5)
nRBC: 0 % (ref 0.0–0.2)

## 2019-11-21 MED ORDER — HEPARIN SOD (PORK) LOCK FLUSH 100 UNIT/ML IV SOLN
INTRAVENOUS | Status: AC
  Filled 2019-11-21: qty 5

## 2019-11-21 MED ORDER — SODIUM CHLORIDE 0.9% FLUSH
10.0000 mL | INTRAVENOUS | Status: DC | PRN
  Administered 2019-11-21: 10 mL via INTRAVENOUS
  Filled 2019-11-21: qty 10

## 2019-11-21 MED ORDER — SODIUM CHLORIDE 0.9 % IV SOLN
Freq: Once | INTRAVENOUS | Status: AC
  Filled 2019-11-21: qty 250

## 2019-11-21 MED ORDER — HEPARIN SOD (PORK) LOCK FLUSH 100 UNIT/ML IV SOLN
500.0000 [IU] | Freq: Once | INTRAVENOUS | Status: AC | PRN
  Administered 2019-11-21: 500 [IU]
  Filled 2019-11-21: qty 5

## 2019-11-21 MED ORDER — SODIUM CHLORIDE 0.9 % IV SOLN
10.0000 mg/kg | Freq: Once | INTRAVENOUS | Status: AC
  Administered 2019-11-21: 740 mg via INTRAVENOUS
  Filled 2019-11-21: qty 10

## 2019-11-21 NOTE — Progress Notes (Signed)
Pt has been dx with meniere's disease and on a medication to trial.

## 2019-11-21 NOTE — Progress Notes (Signed)
Hematology/Oncology Consult note [facility]  [phone] [phone]  Patient Care Team: [name], MD as PCP - General (Family Medicine) Telford Nab, RN as Registered Nurse   Name of the patient: Jennifer Hodges  [id number]  1945/06/08   Date of visit: 11/21/19  Diagnosis- Adenocarcinoma of the lung stage III T1b N2 M0  Chief complaint/ Reason for visit-on treatment assessment prior to next cycle of maintenance durvalumab  Heme/Onc history: patient is a 74 year old female who was seen by pulmonary and ENT for ongoing symptoms of bronchitis and possible laryngitis and received antibiotics for the same.She underwent CT chest in September 2020 which showed multiple bilateral lung nodules with associated mediastinal adenopathy and possible primary left upper lobe lung mass. This was followed by a PET CT scan which showed a left upper lobe lung mass measuring 1.9 x 1.2 cm with an SUV of 10.7. Conglomerate AP window adenopathy measuring 5.8 x 2.1 cm with an SUV of 14. She was also noted to have hypermetabolic left hilar adenopathy and left supraclavicular 0.7 cm lymph node with an SUV of 4.6. Noted to have a 7 mm right lower lobe lung nodule with faint metabolic activity. No evidence of other distant metastatic disease. This was followed by a CT super D chest without contrast. The right lower lobe lung nodules were somewhat larger as compared to September 2020. 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 Maintenance durvalumab started in January 2021after scan showed partial response.  Interval history-she continues to have issues with vertigo especially with overhead movements.  She is seeing Dr. Pryor Ochoa from ENT.  She has been started on a new medication for this and if symptoms do not improve he plans to get another MRI  ECOG PS- 1 Pain scale- 0   Review of systems- Review of Systems  Constitutional: Negative for chills, fever, malaise/fatigue and weight loss.  HENT: Negative for congestion, ear discharge and nosebleeds.   Eyes: Negative for blurred vision.  Respiratory: Negative for cough, hemoptysis, sputum production, shortness of breath and wheezing.   Cardiovascular: Negative for chest pain, palpitations, orthopnea and claudication.  Gastrointestinal: Negative for abdominal pain, blood in stool, constipation, diarrhea, heartburn, melena, nausea and vomiting.  Genitourinary: Negative for dysuria, flank pain, frequency, hematuria and urgency.  Musculoskeletal: Negative for back pain, joint pain and myalgias.  Skin: Negative for rash.  Neurological: Positive for dizziness. Negative for tingling, focal weakness, seizures, weakness and headaches.  Endo/Heme/Allergies: Does not bruise/bleed easily.  Psychiatric/Behavioral: Negative for depression and suicidal ideas. The patient does not have insomnia.       Allergies  Allergen Reactions  . Aspirin Hives and Other (See Comments)    Difficulty breathing  . Celebrex [Celecoxib] Shortness Of Breath  . Morphine And Related Nausea And Vomiting and Swelling  . Adhesive [Tape] Other (See Comments)    Took top layer of skin off when removed.  . Clarithromycin Nausea And Vomiting  . Codeine Nausea And Vomiting  . Darvon [Propoxyphene Hcl] Nausea And Vomiting  . Demerol [Meperidine] Nausea And Vomiting  . Flonase [Fluticasone Propionate] Other (See Comments)  Fungal infection  . Simvastatin Other  (See Comments)    "caused ulcers in mouth, and fever"  . Talwin [Pentazocine] Nausea And Vomiting     Past Medical History:  Diagnosis Date  . Anemia   . Asthma    No Inhalers--Dr. [name] will order as needed  . Bronchiectasis (HCC)    mild  . Chronic headaches     followed by [facility] migraines  . COPD (chronic obstructive pulmonary disease) ([place])   . DDD (degenerative disc disease), lumbar   . Diabetes mellitus without complication [facility])    "doctor says I no longer have diabetes"    . Diverticulosis   . Family history of adverse reaction to anesthesia    PONV  . Gall stones    history of  . GERD (gastroesophageal reflux disease)    EGD 8/09- non bleeding erosive gastritis, documentd esophageal ulcerations.   . Hiatal hernia   . History of pneumonia   . Hypercholesterolemia   . IBS (irritable bowel syndrome)   . Malignant neoplasm of upper lobe of left lung ([place]) 12/27/2018  . Murmur   . Osteoarthritis    lumbar disc disease, left hip  . PONV (postoperative nausea and vomiting)    "Only with last hip and I believe it was due to the morphine"   . Sleep apnea    cpap asked to bring mask and tubing  . Vertigo   . Weakness of right side of body      Past Surgical History:  Procedure Laterality Date  . ABDOMINAL HYSTERECTOMY  age 32  . ANTERIOR CERVICAL DECOMP/DISCECTOMY FUSION N/A 02/24/2015   Procedure: CERVICAL FOUR-FIVE, CERVICAL FIVE-SIX, CERVICAL SIX-SEVEN ANTERIOR CERVICAL DECOMPRESSION/DISCECTOMY FUSION ;  Surgeon: [name], MD;  Location: [place] [facility];  Service: Neurosurgery;  Laterality: N/A;  C45 C56 C67 anterior cervical decompression with fusion interbody prosthesis plating and bonegraft  . APPENDECTOMY    . BACK SURGERY     4th lumbar fusion  . BLADDER SURGERY N/A    with vaginal wall repair  . BREAST CYST ASPIRATION Bilateral    neg  . BREAST SURGERY Bilateral    cyst removed and reduction  . CARDIAC CATHETERIZATION  2014  . CARPAL  TUNNEL RELEASE Right 02/11/2016   Procedure: CARPAL TUNNEL RELEASE;  Surgeon: [name], MD;  Location: [facility] ORS;  Service: Orthopedics;  Laterality: Right;  . CATARACT EXTRACTION W/ INTRAOCULAR LENS IMPLANT Bilateral 2015  . CHOLECYSTECTOMY    . EUS N/A 05/31/2012   Procedure: UPPER ENDOSCOPIC ULTRASOUND (EUS) LINEAR;  Surgeon: [name], MD;  Location: [facility];  Service: Endoscopy;  Laterality: N/A;  . EXCISIONAL HEMORRHOIDECTOMY    . JOINT REPLACEMENT Bilateral   . KNEE ARTHROSCOPY WITH LATERAL MENISECTOMY Right 07/07/2015   Procedure: KNEE ARTHROSCOPY WITH LATERAL MENISECTOMY, PARTIAL SYNOVECTOMY;  Surgeon: [name], MD;  Location: [facility];  Service: Orthopedics;  Laterality: Right;  . LUMBAR LAMINECTOMY    . PORTA CATH INSERTION N/A 01/07/2019   Procedure: PORTA CATH INSERTION;  Surgeon: [name], MD;  Location: [facility];  Service: Cardiovascular;  Laterality: N/A;  . REDUCTION MAMMAPLASTY  1990  . RIGHT OOPHORECTOMY    . TOTAL HIP ARTHROPLASTY Left 05/01/2014   Dr. [name]  . TOTAL HIP ARTHROPLASTY Right 08/05/2014   Procedure: TOTAL HIP ARTHROPLASTY ANTERIOR APPROACH;  Surgeon: [name], MD;  Location: [facility];  Service: Orthopedics;  Laterality: Right;  . ULNAR NERVE TRANSPOSITION Right 02/11/2016   Procedure:  ULNAR NERVE DECOMPRESSION/TRANSPOSITION;  Surgeon: [name], MD;  Location: [facility];  Service: Orthopedics;  Laterality: Right;  Marland Kitchen VIDEO BRONCHOSCOPY WITH ENDOBRONCHIAL ULTRASOUND Left 12/19/2018   Procedure: VIDEO BRONCHOSCOPY WITH ENDOBRONCHIAL ULTRASOUND, LEFT, SLEEP APNEA;  Surgeon: [name], MD;  Location: [facility] OR[facility];  Service: Thoracic;  Laterality: Left;    Social History   Socioeconomic History  . Marital status: Married    Spouse name: Not on file  . Number of children: Not on file  . Years of education: Not on file  . Highest education level: Not on file  Occupational History  . Not on file  Tobacco Use  .  Smoking status: Former Smoker    Quit date: 06/13/2007    Years since quitting: 12.4  . Smokeless tobacco: Never Used  Vaping Use  . Vaping Use: Never used  Substance and Sexual Activity  . Alcohol use: No    Alcohol/week: 0.0 standard drinks  . Drug use: No  . Sexual activity: Never  Other Topics Concern  . Not on file  Social History Narrative  . Not on file   Social Determinants of Health   Financial Resource Strain:   . Difficulty of Paying Living Expenses: Not on file  Food Insecurity:   . Worried About Charity fundraiser in the Last Year: Not on file  . Ran Out of Food in the Last Year: Not on file  Transportation Needs:   . Lack of Transportation (Medical): Not on file  . Lack of Transportation (Non-Medical): Not on file  Physical Activity:   . Days of Exercise per Week: Not on file  . Minutes of Exercise per Session: Not on file  Stress:   . Feeling of Stress : Not on file  Social Connections:   . Frequency of Communication with Friends and Family: Not on file  . Frequency of Social Gatherings with Friends and Family: Not on file  . Attends Religious Services: Not on file  . Active Member of Clubs or Organizations: Not on file  . Attends Archivist Meetings: Not on file  . Marital Status: Not on file  Intimate Partner Violence:   . Fear of Current or Ex-Partner: Not on file  . Emotionally Abused: Not on file  . Physically Abused: Not on file  . Sexually Abused: Not on file    Family History  Problem Relation Age of Onset  . Heart disease Mother        s/p stent  . Hypertension Mother   . Hypercholesterolemia Mother   . Diabetes Father   . Stomach cancer Other        uncle  . Breast cancer Cousin   . Breast cancer Paternal Aunt      Current Outpatient Medications:  .  albuterol (PROVENTIL) (2.5 MG/3ML) 0.083% nebulizer solution, Take 2.5 mg by nebulization every 6 (six) hours as needed for wheezing., Disp: , Rfl:  .  albuterol (VENTOLIN  HFA) 108 (90 Base) MCG/ACT inhaler, Inhale 2 puffs into the lungs every 6 (six) hours as needed for wheezing or shortness of breath., Disp: 18 g, Rfl: 0 .  Bioflavonoid Products (ESTER C PO), Take 1 tablet by mouth 3 (three) times daily., Disp: , Rfl:  .  Calcium-Magnesium-Zinc (CAL-MAG-ZINC PO), Take 1 tablet by mouth daily., Disp: , Rfl:  .  cetirizine (ZYRTEC) 5 MG tablet, Take 1 tablet (5 mg total) by mouth daily. (Patient taking differently: Take 5 mg by mouth daily as needed (runny  nose.). ), Disp: 30 tablet, Rfl: 0 .  CHLORASEPTIC MAX SORE THROAT 1.5-33 % LIQD, Use as directed 1 spray in the mouth or throat as needed (throat irritation.). , Disp: , Rfl:  .  Cholecalciferol (EQL VITAMIN D3) 25 MCG (1000 UT) capsule, Take 4,000 Units by mouth daily., Disp: , Rfl:  .  CHROMIUM PO, Take 1 capsule by mouth daily., Disp: , Rfl:  .  Coenzyme Q10 (CO Q 10) 100 MG CAPS, Take 100 mg by mouth daily. , Disp: , Rfl:  .  Cyanocobalamin (VITAMIN B-12) 2500 MCG SUBL, Place 2,500 mcg under the tongue daily., Disp: , Rfl:  .  cyclobenzaprine (FLEXERIL) 10 MG tablet, Take 10 mg by mouth at bedtime. , Disp: , Rfl:  .  DHEA 25 MG CAPS, Take 25 mg by mouth daily., Disp: , Rfl:  .  Ensure (ENSURE), Take 237 mLs by mouth 2 (two) times daily between meals., Disp: , Rfl:  .  ezetimibe (ZETIA) 10 MG tablet, Take 10 mg by mouth at bedtime., Disp: , Rfl:  .  folic acid (FOLVITE) 381 MCG tablet, Take 400 mcg by mouth 2 (two) times daily. , Disp: , Rfl:  .  Garlic 0175 MG CAPS, Take 1,000 mg by mouth daily., Disp: , Rfl:  .  Histamine Dihydrochloride (AUSTRALIAN DREAM ARTHRITIS EX), Apply 1 application topically 4 (four) times daily as needed (pain.)., Disp: , Rfl:  .  HYDROcodone-acetaminophen (NORCO/VICODIN) 5-325 MG tablet, 1-2 tabs po bid prn (Patient taking differently: Take 1 tablet by mouth at bedtime. ), Disp: 10 tablet, Rfl: 0 .  HYDROcodone-homatropine (HYCODAN) 5-1.5 MG/5ML syrup, Take 5 mLs by mouth 3 (three)  times daily as needed for cough., Disp: 120 mL, Rfl: 0 .  ipratropium (ATROVENT) 0.06 % nasal spray, Place 2 sprays into both nostrils 4 (four) times daily as needed for rhinitis., Disp: 15 mL, Rfl: 0 .  lidocaine-prilocaine (EMLA) cream, lidocaine-prilocaine 2.5 %-2.5 % topical cream, Disp: , Rfl:  .  meclizine (ANTIVERT) 25 MG tablet, Take 1 tablet (25 mg total) by mouth every 6 (six) hours as needed for dizziness., Disp: 30 tablet, Rfl: 0 .  montelukast (SINGULAIR) 10 MG tablet, Take 10 mg by mouth at bedtime. , Disp: , Rfl:  .  Mouthwash Compounding Base (MOUTHWASH-AF PO), mouthwash  SWISH AND SWALLOW 1-2 TEASPOONFUL BY MOUTH FOUR TIMES DAILY, Disp: , Rfl:  .  NON FORMULARY, Take 1 capsule by mouth daily. , Disp: , Rfl:  .  NON FORMULARY, Take 8 mg by mouth in the morning, at noon, and at bedtime. Betahistine capsules 1 tid, Disp: , Rfl:  .  ondansetron (ZOFRAN-ODT) 4 MG disintegrating tablet, Take 4 mg by mouth every 8 (eight) hours as needed for nausea/vomiting., Disp: , Rfl:  .  Polyethyl Glycol-Propyl Glycol (SYSTANE) 0.4-0.3 % SOLN, Place 1 drop into both eyes 5 (five) times daily as needed (dry/irritated eyes). , Disp: , Rfl:  .  pyridOXINE (VITAMIN B-6) 100 MG tablet, Take 100 mg by mouth daily., Disp: , Rfl:  .  Rimegepant Sulfate 75 MG TBDP, Take by mouth. , Disp: , Rfl:  .  Selenium 200 MCG CAPS, Take 200 mcg by mouth daily., Disp: , Rfl:  .  STIOLTO RESPIMAT 2.5-2.5 MCG/ACT AERS, SMARTSIG:2 Puff(s) Via Inhaler Daily, Disp: , Rfl:  .  sucralfate (CARAFATE) 1 g tablet, 1 tablet 3 (three) times daily as needed. , Disp: , Rfl:  .  vitamin E 400 UNIT capsule, Take 400 Units by mouth daily.,  Disp: , Rfl:  .  Zoledronic Acid (RECLAST IV), Inject 1 Dose into the vein as directed. ONCE A YEAR, Disp: , Rfl:  .  umeclidinium-vilanterol (ANORO ELLIPTA) 62.5-25 MCG/INH AEPB, Inhale 1 puff into the lungs as needed.  (Patient not taking: Reported on 11/07/2019), Disp: , Rfl:  .  Vitamin A 2250 MCG  (7500 UT) CAPS, Take by mouth., Disp: , Rfl:  .  Zoster Vaccine Adjuvanted [facility]) injection, Shingrix (PF) 50 mcg/0.5 mL intramuscular suspension, kit  ADM 0.5ML IM UTD (Patient not taking: Reported on 11/07/2019), Disp: , Rfl:  No current facility-administered medications for this visit.  Facility-Administered Medications Ordered in Other Visits:  .  sodium chloride flush (NS) 0.9 % injection 10 mL, 10 mL, Intravenous, PRN, Sindy Guadeloupe, MD, 10 mL at 11/21/19 1018  Physical exam:  Vitals:   11/21/19 1117  BP: 112/69  Pulse: 76  Resp: 16  Temp: 98.7 F (37.1 C)  TempSrc: Oral  Weight: 162 lb 11.2 oz (73.8 kg)  Height: _0  (1.575 m)   Physical Exam Constitutional:      General: She is not in acute distress. Cardiovascular:     Rate and Rhythm: Normal rate and regular rhythm.     Heart sounds: Normal heart sounds.  Pulmonary:     Effort: Pulmonary effort is normal.     Breath sounds: Normal breath sounds.  Abdominal:     General: Bowel sounds are normal.     Palpations: Abdomen is soft.  Skin:    General: Skin is warm and dry.  Neurological:     Mental Status: She is alert and oriented to person, place, and time.      CMP Latest Ref Rng & Units 11/21/2019  Glucose 70 - 99 mg/dL 89  BUN 8 - 23 mg/dL 10  Creatinine 0.44 - 1.00 mg/dL 0.50  Sodium 135 - 145 mmol/L 139  Potassium 3.5 - 5.1 mmol/L 4.1  Chloride 98 - 111 mmol/L 103  CO2 22 - 32 mmol/L 28  Calcium 8.9 - 10.3 mg/dL 8.8(L)  Total Protein 6.5 - 8.1 g/dL 6.4(L)  Total Bilirubin 0.3 - 1.2 mg/dL 0.4  Alkaline Phos 38 - 126 U/L 65  AST 15 - 41 U/L 21  ALT 0 - 44 U/L 16   CBC Latest Ref Rng & Units 11/21/2019  WBC 4.0 - 10.5 K/uL 3.4(L)  Hemoglobin 12.0 - 15.0 g/dL 11.7(L)  Hematocrit 36 - 46 % 34.4(L)  Platelets 150 - 400 K/uL 208      Assessment and plan- Patient is a 74 y.o. female with stage III adenocarcinoma of the lung. She is here for on treatment assessment prior to cycle 16 of maintenance  durvalumab  Counts okay to proceed with cycle 16 of maintenance durvalumab today.  I will see her back in 2 weeks for cycle 17.  Repeat scans do In October 2021.  Dizziness: Likely secondary to inner ear disease/Mnire's disease.  She is being followed by ENT.  We did get an MRI brain back in February 2021 when she had similar symptoms which did not show any evidence of brain metastases.  Consideration is being given for a repeat MRI if her symptoms not improve with medical management   Visit Diagnosis 1. Encounter for antineoplastic immunotherapy   2. Malignant neoplasm of upper lobe of left lung ([place])      Dr. Randa Evens, MD, MPH [facility] [phone] 11/21/2019 3:45 PM

## 2019-11-22 ENCOUNTER — Ambulatory Visit: Payer: Medicare Other | Attending: Otolaryngology | Admitting: Physical Therapy

## 2019-11-22 ENCOUNTER — Encounter: Payer: Self-pay | Admitting: Physical Therapy

## 2019-11-22 ENCOUNTER — Other Ambulatory Visit: Payer: Self-pay

## 2019-11-22 DIAGNOSIS — R42 Dizziness and giddiness: Secondary | ICD-10-CM | POA: Diagnosis present

## 2019-11-22 DIAGNOSIS — R2681 Unsteadiness on feet: Secondary | ICD-10-CM | POA: Diagnosis present

## 2019-11-22 DIAGNOSIS — M6281 Muscle weakness (generalized): Secondary | ICD-10-CM | POA: Insufficient documentation

## 2019-11-22 NOTE — Therapy (Addendum)
[facility] [address]. [place], [place], [address] Phone: [phone]   Fax:  [phone]  Physical Therapy Evaluation  Patient Details  Name: Jennifer Hodges MRN: [id number] Date of Birth: 1945/10/20 Referring Provider (PT): Meta Hatchet, [place]   Encounter Date: 11/22/2019   PT End of Session - 11/22/19 1112    Visit Number 1    Number of Visits 9    Date for PT Re-Evaluation 01/24/20    Authorization Type Initial Eval 11/22/19 FOTO    PT Start Time 0846    PT Stop Time 0948    PT Time Calculation (min) 62 min    Equipment Utilized During Treatment Gait belt    Activity Tolerance Patient tolerated treatment well    Behavior During Therapy [facility] for tasks assessed/performed           Past Medical History:  Diagnosis Date  . Anemia   . Asthma    No Inhalers--Dr. Meredeth Ide will order as needed  . Bronchiectasis (HCC)    mild  . Chronic headaches     followed by [facility] migraines  . COPD (chronic obstructive pulmonary disease) (HCC)   . DDD (degenerative disc disease), lumbar   . Diabetes mellitus without complication [facility])    "doctor says I no longer have diabetes"    . Diverticulosis   . Family history of adverse reaction to anesthesia    PONV  . Gall stones    history of  . GERD (gastroesophageal reflux disease)    EGD 8/09- non bleeding erosive gastritis, documentd esophageal ulcerations.   . Hiatal hernia   . History of pneumonia   . Hypercholesterolemia   . IBS (irritable bowel syndrome)   . 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 Murmur   . Osteoarthritis    lumbar disc disease, left hip  . PONV (postoperative nausea and vomiting)    "Only with last hip and I believe it was due to the morphine"   . Sleep apnea    cpap asked to bring mask and tubing  . Vertigo   . Weakness of right side of body     Past Surgical History:  Procedure Laterality Date  . ABDOMINAL  HYSTERECTOMY  age 74  . ANTERIOR CERVICAL DECOMP/DISCECTOMY FUSION N/A 02/24/2015   Procedure: CERVICAL FOUR-FIVE, CERVICAL FIVE-SIX, CERVICAL SIX-SEVEN ANTERIOR CERVICAL DECOMPRESSION/DISCECTOMY FUSION ;  Surgeon: Lisbeth Renshaw, MD;  Location: [facility] NEURO ORS;  Service: Neurosurgery;  Laterality: N/A;  C45 C56 C67 anterior cervical decompression with fusion interbody prosthesis plating and bonegraft  . APPENDECTOMY    . BACK SURGERY     4th lumbar fusion  . BLADDER SURGERY N/A    with vaginal wall repair  . BREAST CYST ASPIRATION Bilateral    neg  . BREAST SURGERY Bilateral    cyst removed and reduction  . CARDIAC CATHETERIZATION  2014  . CARPAL TUNNEL RELEASE Right 02/11/2016   Procedure: CARPAL TUNNEL RELEASE;  Surgeon: Kennedy Bucker, MD;  Location: [facility] ORS;  Service: Orthopedics;  Laterality: Right;  . CATARACT EXTRACTION W/ INTRAOCULAR LENS IMPLANT Bilateral 2015  . CHOLECYSTECTOMY    . EUS N/A 05/31/2012   Procedure: UPPER ENDOSCOPIC ULTRASOUND (EUS) LINEAR;  Surgeon: Rachael Fee, MD;  Location: [facility] ENDOSCOPY;  Service: Endoscopy;  Laterality: N/A;  . EXCISIONAL HEMORRHOIDECTOMY    . JOINT REPLACEMENT Bilateral   . KNEE ARTHROSCOPY WITH LATERAL MENISECTOMY Right 07/07/2015   Procedure: KNEE ARTHROSCOPY  WITH LATERAL MENISECTOMY, PARTIAL SYNOVECTOMY;  Surgeon: Kennedy Bucker, MD;  Location: [facility] ORS;  Service: Orthopedics;  Laterality: Right;  . LUMBAR LAMINECTOMY    . PORTA CATH INSERTION N/A 01/07/2019   Procedure: PORTA CATH INSERTION;  Surgeon: Annice Needy, MD;  Location: [facility] INVASIVE CV LAB;  Service: Cardiovascular;  Laterality: N/A;  . REDUCTION MAMMAPLASTY  1990  . RIGHT OOPHORECTOMY    . TOTAL HIP ARTHROPLASTY Left 05/01/2014   Dr. Sheppard Evens  . TOTAL HIP ARTHROPLASTY Right 08/05/2014   Procedure: TOTAL HIP ARTHROPLASTY ANTERIOR APPROACH;  Surgeon: Kennedy Bucker, MD;  Location: [facility] ORS;  Service: Orthopedics;  Laterality: Right;  . ULNAR NERVE TRANSPOSITION Right  02/11/2016   Procedure: ULNAR NERVE DECOMPRESSION/TRANSPOSITION;  Surgeon: Kennedy Bucker, MD;  Location: [facility] ORS;  Service: Orthopedics;  Laterality: Right;  Marland Kitchen VIDEO BRONCHOSCOPY WITH ENDOBRONCHIAL ULTRASOUND Left 12/19/2018   Procedure: VIDEO BRONCHOSCOPY WITH ENDOBRONCHIAL ULTRASOUND, LEFT, SLEEP APNEA;  Surgeon: Vida Rigger, MD;  Location: [facility] ORS;  Service: Thoracic;  Laterality: Left;    There were no vitals filed for this visit.    Subjective Assessment - 11/22/19 0846    Subjective Patient reports that she feels that her dizziness, headaches and balance have worsened since last being seen for therapy, which was June 2021. Patient states she has been seen by ENT physicians and states she has Meniere's Disease.    Pertinent History Patient reports that she has had follow-up appointments at ENT office and states she is being worked up for Meniere's Disease. Patient states she is following the Meniere's low sodium diet. Patient reports that she was given Betahistine pills for 30 days and if her headaches are not better they are going to do a brain MRI. Patient reports she has a family history of Meniere's disease and migraines. Patient reports she has to be careful when she lies down on the left side, she gets a falling and vertigo sensation which lasts until she falls asleep. When she wakes up, she says she still has the same sensation when she sits up. Patient reports she had another hearing test and she reports she does have bilateral hearing loss. Patient reports she uses a RW. Patient reports she veers to the left when she walks.  Patient states she does not drive do to her symptoms.  Patient reports the Meclizine is helping with her nausea. Patient reports she has constant headache that never goes away. She describes it as a dull headache. Patient reports that she has had dizzy spells in the past, but this was more than her usual symptoms. Patient reports she began to have dizziness again  last year and then patient began to have symptoms of vertigo, nausea and vomiting in February 2021 when she woke up in the middle of the night with a bad headache and she tried to lift her head off the pillow.  Patient states she was nauseous and could not sleep on her left side at all. Patient reports that she was been seen by Dr. Andee Poles and Dr. 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 Patient received Epley maneuver at ENT office for left BPPV in March. Patient states the treatments helped but it is still there. Patient had VNG study on 06/19/2019 that revealed abnormal smooth pursuits, saccades and all OPK's. 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 Patient reports that  Dr. Pryor Ochoa felt that the chemotherapy drugs might have caused the inner ear damage and balance difficulties. Patient describes her dizziness as vertigo, unsteadiness and lightheadedness.    Diagnostic tests MRI brain- normal study for age    Patient Stated Goals decreased dizziness, improved balance, improved walking    Currently in Pain? --   none stated             [facility] PT Assessment - 11/22/19 0919      Assessment   Medical Diagnosis dizziness and giddiness    Referring Provider (PT) Brett Fairy, PA-C    Prior Therapy vestibular therapy      Precautions   Precautions Fall      Restrictions   Weight Bearing Restrictions No      Balance Screen   Has the patient fallen in the past 6 months No    Has the patient had a decrease in activity level because of a fear of falling?  Yes    Is the patient reluctant to leave their home because of a fear of falling?  No      Home Environment   Living Environment Private residence    Available Help at Discharge Family    Type of [place]      Prior Function   Level of Independence Independent  with household mobility with device;Independent with community mobility with device;Independent with basic ADLs;Needs assistance with homemaking      Cognition   Overall Cognitive Status Within Functional Limits for tasks assessed      ROM / Strength   AROM / PROM / Strength Strength      Strength   Overall Strength Deficits      Dynamic Gait Index   Level Surface Mild Impairment   using RW   Change in Gait Speed Mild Impairment    Gait with Horizontal Head Turns Mild Impairment    Gait with Vertical Head Turns Mild Impairment    Gait and Pivot Turn Moderate Impairment    Step Over Obstacle Moderate Impairment    Step Around Obstacles Mild Impairment    Steps Moderate Impairment    Total Score 13            VESTIBULAR AND BALANCE EVALUATION   HISTORY:  Subjective history of current problem: History obtained from patient as well as medical record.  Patient reports that she has had follow-up appointments at ENT office and states she is being worked up for [place]. Patient states she is following the Meniere's low sodium diet. Patient reports that she was given Betahistine pills for 30 days and if her headaches are not better they are going to do a brain MRI. Patient reports she has a family history of Meniere's disease and migraines. Patient reports she has to be careful when she lies down on the left side, she gets a falling and vertigo sensation which lasts until she falls asleep. When she wakes up, she says she still has the same sensation when she sits up. Patient reports she had another hearing test and she reports she does have bilateral hearing loss. Patient reports she uses a RW. Patient reports she veers to the left when she walks.  Patient states she does not drive do to her symptoms.  Patient reports the Meclizine is helping with her nausea. Patient reports she has constant headache that never goes away. She describes it as a dull headache.  Patient known to this  Probation officer and was seen for  vestibular therapy May 2021-June 2021. Below is history obtained during evaluation on 5/21. Patient reports that she has had dizzy spells in the past, but this was more than her usual symptoms. Patient reports she began to have dizziness again last year and then patient began to have symptoms of vertigo, nausea and vomiting in February 2021 when she woke up in the middle of the night with a bad headache and she tried to lift her head off the pillow.  Patient states she was nauseous and could not sleep on her left side at all. Patient reports that she was been seen by Dr. Pryor Ochoa and Dr. 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 Patient received Epley maneuver at ENT office for left BPPV in March. Patient states the treatments helped but it is still there. Patient had VNG study on 06/19/2019 that revealed abnormal smooth pursuits, saccades and all OPK's. 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 Patient reports that Dr. Pryor Ochoa felt that the chemotherapy drugs might have caused the inner ear damage and balance difficulties. Patient describes her dizziness as vertigo, unsteadiness and lightheadedness. Patient reports that she always has a certain amount of dizziness now and that it is worse at times. Patient reports that bending over, looking up, rolling over especially onto her left side, quick movements all bring on her symptoms and that Meclizine and staying still help to decrease her symptoms some. Patient reports that if she looks down she will fall. Patient reports that she cannot sleep on her left side due to dizziness. Patient reports that she veers usually to the left when she walks. Patient reports that she has not been driving due to her dizziness symptoms. Patient sees Dr. Manuella Ghazi, neurologist. Patient has  history of migraines, chronic tension headaches and left sided occipital neuralgia. Patient has family history of migraines and dizziness. Patient reports she always has a dull headache and different things can make it worse such as noise and bright light. She takes an injection once a month for migrianes and patient states that helps a lot. Patient reports the last full blown migraine that she has had was when she had the brain MRI on 05/08/2019 from the noise of the machine. Patient reports she gets a repeat MRI every 3-4 months. 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 Patient states that she received chemotherapy last year as well and she has 2 more chemotherapy treatments but they are doing the immunotherapy to try to build up her immune system first. Patient finished her last radiation treatment in December and is now getting infusion therapy. Patient reports that the cancer pressed on nerves in her neck that affect her voice and that she had speech therapy, which patient states helped her a lot. Patient states that Dr. Kathyrn Sheriff plans on doing surgery for her voice to help improve her voice.    Description of dizziness: vertigo, unsteadiness, lightheadedness, sensation of falling backwards Frequency: daily and states her episodes are more frequent now Duration: patient reports dizziness and vertigo lasts hours to days Symptom nature: motion provoked, positional, spontaneous, intermittent  Provocative Factors: lying on her left side, sitting up, any head movement and the more she is up moving around doing things around the house. Reaching up and bending over brings on her symptoms. Easing Factors: sitting, Meclizine  Progression of symptoms: better for the  last days since starting the Betahistime 10 days ago History of similar episodes: yes  Falls (yes/no): no Number of falls in past 6 months:   Auditory complaints (tinnitus, pain, drainage): tinnitus in both ears and sharp pain left  ear   Vision (last eye exam, diplopia, recent changes): patient wears glasses. Patient has seen eye doctor this year.  Current Symptoms: (dysarthria, dysphagia, drop attacks, bowel and bladder changes, recent weight loss/gain)    Review of systems negative for red flags.     EXAMINATION   SOMATOSENSORY:  Any N & T in extremities or weakness: in arms and legs per patient report  MUSCULOSKELETAL SCREEN: Cervical Spine ROM: AROM cervical spine WFL.  Strength:   Left LE grossly +3/5 hip flexion, quads and hams and 5/5 DF  Right  LE grossly 3/5 hip flexion, quads and hams and 5/5 DF  Functional Mobility: Independent with bed mobility and sit to stand transfers.  Gait: Patient arrives ambulating with RW. Patient ambulates with decreased cadence with fair   scanning of visual environment.   Balance: Patient is challenged by single leg stance, narrow BOS, uneven surfaces, ambulation with head turns and  turning.  POSTURAL CONTROL TESTS:  Clinical Test of Sensory Interaction for Balance (CTSIB): deferred  OCULOMOTOR / VESTIBULAR TESTING: Oculomotor Exam- Room Light  Normal Abnormal Comments  Ocular Alignment N    Ocular ROM N    Spontaneous Nystagmus N    Gaze evoked Nystagmus N    Smooth Pursuit  Abn   Saccades  Abn Hypometric saccades noted all fields  VOR  Abn   VOR Cancellation   deferred  Left Head Impulse   deferred  Right Head Impulse   deferred    BPPV TESTS:  Symptoms Duration Intensity Nystagmus  Left Dix-Hallpike None N/A N/A None observed  Left Side-lying test None N/A N/A None observed  Right Dix-Hallpike None N/A N/A None observed    FUNCTIONAL OUTCOME MEASURES:  Results Comments  DHI 74/100 Severe perception of handicap; in need of intervention  ABC Scale 23.8% High falls risk; in need of intervention  DGI 13/24 Falls risk; in need of intervention  FOTO 51/100 Given the patient's risk adjustment variables, like-patients nationally had a FS score of  57/100 at intake   Neuromuscular Re-education:  VOR X 1 exercise:  Demonstrated and educated as to VOR X1.  Patient performed VOR X 1 horizontal in sitting 1 reps of 1 minute each with verbal cues for technique.    Sit to Stands:  Patient educated as to proper form with sit to stand. Patient performed 10 reps sit to stand from armless chair with verbal cuing for sequencing and to control eccentric descent with CGA. Patient had one episode of uncontrolled descent which patient reports was because her left knee gave way. Patient issued VOR X 1 in sitting and sit to stand exercises for HEP. Patient reports she has balance problems and states she had stopped doing her prior home exercise program. Patient reports she did stationary biking once a week for a few weeks statrting at ten minute increments and then she had to stop.      PT Education - 11/22/19 1157    Education Details Patient issued VOR X 1 in sitting and sit to stand exercises for HEP,vestibular slides 3, 13. Discussed plan of care.    Person(s) Educated Patient    Methods Explanation;Demonstration;Verbal cues;Handout    Comprehension Verbalized understanding;Returned demonstration  PT Short Term Goals - 11/22/19 1113      PT SHORT TERM GOAL #1   Title Patient will be independent in home exercise program to improve strength/mobility for better functional independence with ADLs and for self-management.    Time 4    Period Weeks    Status New    Target Date 12/20/19             PT Long Term Goals - 11/22/19 1127      PT LONG TERM GOAL #1   Title Patient will reduce falls risk as indicated by Activities Specific Balance Confidence Scale (ABC) >67%.    Baseline scored 13.3% on 07/19/2019; scored 45% on 08/30/19; SCORED 23.8% on 11/22/19    Time 8    Period Weeks    Status Partially Met    Target Date 01/17/20      PT LONG TERM GOAL #2   Title Patient will reduce perceived disability to moderate levels as  indicated by <70 on Dizziness Handicap Inventory [facility]).    Baseline scored 92/100 on 07/19/19; scored 28/100 on 08/30/19; SCORED 74/100 on 11/22/19    Time 8    Period Weeks    Status Achieved    Target Date 01/17/20      PT LONG TERM GOAL #3   Title Patient will have improved functional status as evidenced by an increase of 10 points or more on the FOTO score.    Baseline scored 45/100 on 07/19/19; scored 52/100 on 08/30/19; SCORED 51/100 on 11/22/19    Time 8    Period Weeks    Status Partially Met    Target Date 01/17/20      PT LONG TERM GOAL #4   Title Patient will demonstrate reduced falls risk as evidenced by Dynamic Gait Index (DGI) 18/24 or more for improved safety with ADLs and community mobility.    Baseline Scored 13/24 on 11/22/19                  Plan - 11/22/19 1157    Clinical Impression Statement Patient known to this Probation officer. Patient returns to clinic reporting that she has been diagnosed with Meniere's Disease and states that she has had an increase in dizziness symptoms and increased difficulty with balance and gait since last being seen for therapy. Patient scored 74/100 on the [facility] which indicates severe perception of handicap and 13/24 on the DGI which indicates falls risk. Patient scored 23.8% on the ABC scale which also indicates high falls risk. Patient scored 51/100 on FOTO which is below average as compared to like-patients nationally. Patient with negative canal testing this date but with potential indicators of both peripheral and central signs as inidcated by abnormal smooth pursuits, saccades and VOR. Patient would benefit from PT services to address functional deficits and goals as set on plan of care and to try to decrease patient's falls risk.    Personal Factors and Comorbidities Past/Current Experience;Comorbidity 3+;Time since onset of injury/illness/exacerbation    Comorbidities intractable migraine, left sided occipital neuralgia, COPD, sleep apnea, lung  CA, chronic tension headaches    Examination-Activity Limitations Bed Mobility;Bend;Reach Overhead;Locomotion Level;Stairs;Transfers    Examination-Participation Restrictions Cleaning;Driving;Yard Work    [employer]chant [employer] officer Evolving/Moderate complexity    Clinical Decision Making Moderate    Rehab Potential Fair    PT Frequency 1x / week    PT Duration 8 weeks    PT Treatment/Interventions Canalith Repostioning;Gait training;Stair training;Therapeutic activities;Therapeutic exercise;Balance training;Neuromuscular re-education;Vestibular;Patient/family education  PT Next Visit Plan review HEP; CTSIB; strengthening and balance exercises    PT Home Exercise Plan sit to stand and VOR X 1 in sitting    Consulted and Agree with Plan of Care Patient           Patient will benefit from skilled therapeutic intervention in order to improve the following deficits and impairments:  Decreased balance, Difficulty walking, Dizziness, Decreased strength  Visit Diagnosis: Dizziness and giddiness - Plan: PT plan of care cert/re-cert  Unsteadiness on feet - Plan: PT plan of care cert/re-cert  Muscle weakness (generalized) - Plan: PT plan of care cert/re-cert     Problem List Patient Active Problem List   Diagnosis Date Noted  . Benign breast lumps 01/11/2019  . Gout 01/11/2019  . Hives 01/11/2019  . Lung disease 01/11/2019  . Migraine headache 01/11/2019  . Goals of care, counseling/discussion 12/27/2018  . Malignant neoplasm of upper lobe of left lung ([place]) 12/27/2018  . Night sweats 08/07/2016  . 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 Osteopenia of multiple sites 04/27/2016  . Left carpal tunnel syndrome 02/02/2016  . Carotid stenosis, asymptomatic, bilateral 12/29/2015  . Prediabetes 09/28/2015  . Primary osteoarthritis involving multiple joints 09/28/2015  . Knee pain 05/11/2015  . Chest tightness 03/24/2015  . Stool incontinence 03/24/2015  . Urinary frequency  03/04/2015  . Cervical spondylosis with radiculopathy 02/24/2015  . Pre-syncope 02/19/2015  . URI (upper respiratory infection) 02/15/2015  . Neck pain 12/25/2014  . Pelvic pain in female 11/16/2014  . Primary osteoarthritis of hip 08/05/2014  . Heel pain 06/01/2014  . Diarrhea 06/01/2014  . Health care maintenance 06/01/2014  . Right leg pain 03/30/2014  . Right arm pain 03/30/2014  . Sinusitis 03/30/2014  . Internal nasal lesion 11/12/2013  . Other specified disorders of nose and nasal sinuses 11/12/2013  . Chronic tension-type headache, intractable 10/30/2013  . Intractable migraine without aura and without status migrainosus 10/30/2013  . Occipital neuralgia of left side 10/30/2013  . IBS (irritable bowel syndrome) 09/03/2013  . Rib pain 08/04/2013  . Cervical radiculitis 08/01/2013  . Lumbar radiculitis 08/01/2013  . Palpitations 07/25/2013  . Benign essential hypertension 05/28/2013  . Neuralgia, neuritis, and radiculitis, unspecified 05/28/2013  . Vitamin D deficiency 05/28/2013  . Elevated AFP 04/28/2013  . Abnormality of alpha-fetoprotein 04/28/2013  . Osteoporosis 11/12/2012  . Incomplete emptying of bladder 07/11/2012  . Prolapse of vaginal vault after hysterectomy 07/11/2012  . Nonspecific (abnormal) findings on radiological and other examination of gastrointestinal tract 05/31/2012  . Bronchiectasis ([place]) 02/05/2012  . Sleep apnea 02/05/2012  . Degenerative disc disease, cervical 02/05/2012  . Hypercholesterolemia 02/05/2012  . GERD (gastroesophageal reflux disease) 02/05/2012  . Chronic headaches 02/05/2012   Lady Deutscher PT, DPT [phone] Lady Deutscher 11/22/2019, 12:59 PM  [facility] [address] [place], [place], [address] Phone: [phone]   Fax:  [phone]  Name: CANYON WILLOW MRN: [id number] Date of Birth: [birth year]

## 2019-11-29 ENCOUNTER — Ambulatory Visit: Payer: Medicare Other | Admitting: Physical Therapy

## 2019-11-29 ENCOUNTER — Encounter: Payer: Self-pay | Admitting: Physical Therapy

## 2019-11-29 ENCOUNTER — Other Ambulatory Visit: Payer: Self-pay

## 2019-11-29 DIAGNOSIS — R42 Dizziness and giddiness: Secondary | ICD-10-CM | POA: Diagnosis not present

## 2019-11-29 DIAGNOSIS — M6281 Muscle weakness (generalized): Secondary | ICD-10-CM

## 2019-11-29 DIAGNOSIS — R2681 Unsteadiness on feet: Secondary | ICD-10-CM

## 2019-11-29 NOTE — Therapy (Signed)
[place] [facility] [address] [place], [place], [address] Phone: [phone]   Fax:  [phone]  Physical Therapy Treatment  Patient Details  Name: Jennifer Hodges MRN: [id number] Date of Birth: 07/27/1945 Referring Provider (PT): Brett Fairy, [place]   Encounter Date: 11/29/2019   PT End of Session - 11/29/19 1413    Visit Number 2    Number of Visits 9    Date for PT Re-Evaluation 01/24/20    Authorization Type Initial Eval 11/22/19 FOTO    PT Start Time 0854    PT Stop Time 0946    PT Time Calculation (min) 52 min    Equipment Utilized During Treatment Gait belt    Activity Tolerance Patient tolerated treatment well    Behavior During Therapy [facility] for tasks assessed/performed           Past Medical History:  Diagnosis Date  . Anemia   . Asthma    No Inhalers--Dr. [name] will order as needed  . Bronchiectasis (HCC)    mild  . Chronic headaches     followed by [facility] migraines  . COPD (chronic obstructive pulmonary disease) ([place])   . DDD (degenerative disc disease), lumbar   . Diabetes mellitus without complication [facility])    "doctor says I no longer have diabetes"    . Diverticulosis   . Family history of adverse reaction to anesthesia    PONV  . Gall stones    history of  . GERD (gastroesophageal reflux disease)    EGD 8/09- non bleeding erosive gastritis, documentd esophageal ulcerations.   . Hiatal hernia   . History of pneumonia   . Hypercholesterolemia   . IBS (irritable bowel syndrome)   . Malignant neoplasm of upper lobe of left lung ([place]) 12/27/2018  . Murmur   . Osteoarthritis    lumbar disc disease, left hip  . PONV (postoperative nausea and vomiting)    "Only with last hip and I believe it was due to the morphine"   . Sleep apnea    cpap asked to bring mask and tubing  . Vertigo   . Weakness of right side of body     Past Surgical History:  Procedure Laterality Date  . ABDOMINAL  HYSTERECTOMY  age 14  . ANTERIOR CERVICAL DECOMP/DISCECTOMY FUSION N/A 02/24/2015   Procedure: CERVICAL FOUR-FIVE, CERVICAL FIVE-SIX, CERVICAL SIX-SEVEN ANTERIOR CERVICAL DECOMPRESSION/DISCECTOMY FUSION ;  Surgeon: [name], MD;  Location: [place] [facility];  Service: Neurosurgery;  Laterality: N/A;  C45 C56 C67 anterior cervical decompression with fusion interbody prosthesis plating and bonegraft  . APPENDECTOMY    . BACK SURGERY     4th lumbar fusion  . BLADDER SURGERY N/A    with vaginal wall repair  . BREAST CYST ASPIRATION Bilateral    neg  . BREAST SURGERY Bilateral    cyst removed and reduction  . CARDIAC CATHETERIZATION  2014  . CARPAL TUNNEL RELEASE Right 02/11/2016   Procedure: CARPAL TUNNEL RELEASE;  Surgeon: [name], MD;  Location: [facility] ORS;  Service: Orthopedics;  Laterality: Right;  . CATARACT EXTRACTION W/ INTRAOCULAR LENS IMPLANT Bilateral 2015  . CHOLECYSTECTOMY    . EUS N/A 05/31/2012   Procedure: UPPER ENDOSCOPIC ULTRASOUND (EUS) LINEAR;  Surgeon: [name], MD;  Location: [facility];  Service: Endoscopy;  Laterality: N/A;  . EXCISIONAL HEMORRHOIDECTOMY    . JOINT REPLACEMENT Bilateral   . KNEE ARTHROSCOPY WITH LATERAL MENISECTOMY Right 07/07/2015   Procedure: KNEE ARTHROSCOPY  WITH LATERAL MENISECTOMY, PARTIAL SYNOVECTOMY;  Surgeon: [name], MD;  Location: [facility] ORS;  Service: Orthopedics;  Laterality: Right;  . LUMBAR LAMINECTOMY    . PORTA CATH INSERTION N/A 01/07/2019   Procedure: PORTA CATH INSERTION;  Surgeon: [name], MD;  Location: [place] CV LAB;  Service: Cardiovascular;  Laterality: N/A;  . REDUCTION MAMMAPLASTY  1990  . RIGHT OOPHORECTOMY    . TOTAL HIP ARTHROPLASTY Left 05/01/2014   Dr. [name]  . TOTAL HIP ARTHROPLASTY Right 08/05/2014   Procedure: TOTAL HIP ARTHROPLASTY ANTERIOR APPROACH;  Surgeon: [name], MD;  Location: [facility] ORS;  Service: Orthopedics;  Laterality: Right;  . ULNAR NERVE TRANSPOSITION Right  02/11/2016   Procedure: ULNAR NERVE DECOMPRESSION/TRANSPOSITION;  Surgeon: [name], MD;  Location: [facility] ORS;  Service: Orthopedics;  Laterality: Right;  Marland Kitchen VIDEO BRONCHOSCOPY WITH ENDOBRONCHIAL ULTRASOUND Left 12/19/2018   Procedure: VIDEO BRONCHOSCOPY WITH ENDOBRONCHIAL ULTRASOUND, LEFT, SLEEP APNEA;  Surgeon: [name], MD;  Location: [facility] ORS;  Service: Thoracic;  Laterality: Left;    There were no vitals filed for this visit.   Subjective Assessment - 11/29/19 0855    Subjective Patient reports she is feeling whoozy this morning. Patient states she has been doing her HEP. Patient states that she was a little woobly but states she had her walker with sit to stand and patient reports she could not go fast but states it did cause the whooziness.    Pertinent History Patient reports that she has had follow-up appointments at ENT office and states she is being worked up for [place]. Patient states she is following the Meniere's low sodium diet. Patient reports that she was given Betahistine pills for 30 days and if her headaches are not better they are going to do a brain MRI. Patient reports she has a family history of Meniere's disease and migraines. Patient reports she has to be careful when she lies down on the left side, she gets a falling and vertigo sensation which lasts until she falls asleep. When she wakes up, she says she still has the same sensation when she sits up. Patient reports she had another hearing test and she reports she does have bilateral hearing loss. Patient reports she uses a RW. Patient reports she veers to the left when she walks.  Patient states she does not drive do to her symptoms.  Patient reports the Meclizine is helping with her nausea. Patient reports she has constant headache that never goes away. She describes it as a dull headache. Patient reports that she has had dizzy spells in the past, but this was more than her usual symptoms. Patient reports  she began to have dizziness again last year and then patient began to have symptoms of vertigo, nausea and vomiting in February 2021 when she woke up in the middle of the night with a bad headache and she tried to lift her head off the pillow.  Patient states she was nauseous and could not sleep on her left side at all. Patient reports that she was been seen by Dr. Pryor Ochoa and Dr. 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 Patient received Epley maneuver at ENT office for left BPPV in March. Patient states the treatments helped but it is still there. Patient had VNG study on 06/19/2019 that revealed abnormal smooth pursuits, saccades and all OPK's. VNG results were indicative of potential central pathology  as well as bilateral peripheral vestibular hypofunction. Patient reports that Dr. Pryor Ochoa felt that the chemotherapy drugs might have caused the inner ear damage and balance difficulties. Patient describes her dizziness as vertigo, unsteadiness and lightheadedness.    Diagnostic tests MRI brain- normal study for age    Patient Stated Goals decreased dizziness, improved balance, improved walking           Therapeutic Exercise: Patient performed sit to stand 15 reps and then with Airex pad under feet performed 10 reps.  Patient with min sway upon standing on Airex pad however no loss of balance. Added this progression to a HEP. Patient performed 2 sets of 10 reps with 1 pound weights standing hip flexion, hip abduction, hip extension and hamstring curls with verbal cues for technique. Patient required 1 sitting rest break after standing therapeutic activities. Performed NuStep bike riding for 5 minutes at level 3.  (No-nbilled)  Neuromuscular Re-education:  VOR x 1 exercise: Patient performed VOR X 1 horizontal in standing 3 reps of 1 minute on firm surface with verbal cues initially for amount of  head turn excursion as patient was turning head too far and for speed of head turning as initially patient was turning head very slowly. Patient improved with practice. Patient reports 1-2/10 dizziness.   Step ups: Patient performed 10 reps step up onto over and return on 6 inch wooden step with faded upper extremity support with contact-guard. Patient progressed to light bilateral upper extremity support secondary to weakness in lower extremities with this activity.  Airex pad: Patient performed on Airex pad feet together progressions and semitandem progressions with body turns, horizontal and vertical head turns with no upper extremity support with contact-guard assist. Patient reports no increase in dizziness with this activity.  Patient's balance was challenged with this activity especially with semitandem progressions.  Ambulation with head turns: Patient ambulated 150 feet with rolling walker with contact-guard while performing horizontal head turn and repeated with random head turns including horizontal and vertical. Patient performed retroambulation 150 feet with rolling walker with horizontal and then vertical head turns with contact-guard. Patient denies dizziness with this activity.  Patient motivated to session.  Patient able to progress patient's home exercise program to sit to stand with pillow under her feet as well as progress to standing VOR x1 exercise.  Patient challenged by standing hip exercises with 1 pound weight this day we will continue to work on progressions of strengthening exercises.  Patient reports that activities with head turns this date actually improved her dizziness symptoms.  Patient does report mild reproduction of symptoms with VOR x1 exercise will continue to work on progressions of VOR in next session.  Patient would benefit from continued PT services to further address functional deficits and goals as set on plan of care and in order to decrease patient's  fall risk.  Next session will plan on performing lower extremity strengthening exercises such as Thera-Band or standing three-way hip with weights, progressions of VOR x1, mini squats and step ups.    PT Education - 11/29/19 1410    Education Details Reviewed home exercise program progress VOR x1 to standing and sit to stand with pillow under feet for HEP.    Person(s) Educated Patient    Methods Explanation;Verbal cues;Demonstration    Comprehension Verbalized understanding;Returned demonstration            PT Short Term Goals - 11/22/19 1113      PT SHORT TERM GOAL #1  Title Patient will be independent in home exercise program to improve strength/mobility for better functional independence with ADLs and for self-management.    Time 4    Period Weeks    Status New    Target Date 12/20/19             PT Long Term Goals - 11/22/19 1127      PT LONG TERM GOAL #1   Title Patient will reduce falls risk as indicated by Activities Specific Balance Confidence Scale (ABC) >67%.    Baseline scored 13.3% on 07/19/2019; scored 45% on 08/30/19; SCORED 23.8% on 11/22/19    Time 8    Period Weeks    Status Partially Met    Target Date 01/17/20      PT LONG TERM GOAL #2   Title Patient will reduce perceived disability to moderate levels as indicated by <70 on Dizziness Handicap Inventory [facility]).    Baseline scored 92/100 on 07/19/19; scored 28/100 on 08/30/19; SCORED 74/100 on 11/22/19    Time 8    Period Weeks    Status Achieved    Target Date 01/17/20      PT LONG TERM GOAL #3   Title Patient will have improved functional status as evidenced by an increase of 10 points or more on the FOTO score.    Baseline scored 45/100 on 07/19/19; scored 52/100 on 08/30/19; SCORED 51/100 on 11/22/19    Time 8    Period Weeks    Status Partially Met    Target Date 01/17/20      PT LONG TERM GOAL #4   Title Patient will demonstrate reduced falls risk as evidenced by Dynamic Gait Index (DGI) 18/24 or  more for improved safety with ADLs and community mobility.    Baseline Scored 13/24 on 11/22/19                 Plan - 11/29/19 1429    Clinical Impression Statement Patient motivated to session.  Patient able to progress patient's home exercise program to sit to stand with pillow under her feet as well as progress to standing VOR x1 exercise.  Patient challenged by standing hip exercises with 1 pound weight this day we will continue to work on progressions of strengthening exercises.  Patient reports that activities with head turns this date actually improved her dizziness symptoms.  Patient does report mild reproduction of symptoms with VOR x1 exercise will continue to work on progressions of VOR in next session.  Patient would benefit from continued PT services to further address functional deficits and goals as set on plan of care and in order to decrease patient's fall risk.  Next session will plan on performing lower extremity strengthening exercises such as Thera-Band or standing three-way hip with weights, progressions of VOR x1, mini squats and step ups.    Personal Factors and Comorbidities Past/Current Experience;Comorbidity 3+;Time since onset of injury/illness/exacerbation    Comorbidities intractable migraine, left sided occipital neuralgia, COPD, sleep apnea, lung CA, chronic tension headaches    Examination-Activity Limitations Bed Mobility;Bend;Reach Overhead;Locomotion Level;Stairs;Transfers    Examination-Participation Restrictions Cleaning;Driving;Yard Work    [employer] Evolving/Moderate complexity    Rehab Potential Fair    PT Frequency 1x / week    PT Duration 8 weeks    PT Treatment/Interventions Canalith Repostioning;Gait training;Stair training;Therapeutic activities;Therapeutic exercise;Balance training;Neuromuscular re-education;Vestibular;Patient/family education    PT Next Visit Plan review HEP; CTSIB; strengthening and balance exercises     PT Home Exercise Plan  sit to stand and VOR X 1 in sitting    Consulted and Agree with Plan of Care Patient           Patient will benefit from skilled therapeutic intervention in order to improve the following deficits and impairments:  Decreased balance, Difficulty walking, Dizziness, Decreased strength  Visit Diagnosis: Dizziness and giddiness  Unsteadiness on feet  Muscle weakness (generalized)     Problem List Patient Active Problem List   Diagnosis Date Noted  . Benign breast lumps 01/11/2019  . Gout 01/11/2019  . Hives 01/11/2019  . Lung disease 01/11/2019  . Migraine headache 01/11/2019  . Goals of care, counseling/discussion 12/27/2018  . Malignant neoplasm of upper lobe of left lung ([place]) 12/27/2018  . Night sweats 08/07/2016  . 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 Osteopenia of multiple sites 04/27/2016  . Left carpal tunnel syndrome 02/02/2016  . Carotid stenosis, asymptomatic, bilateral 12/29/2015  . Prediabetes 09/28/2015  . Primary osteoarthritis involving multiple joints 09/28/2015  . Knee pain 05/11/2015  . Chest tightness 03/24/2015  . Stool incontinence 03/24/2015  . Urinary frequency 03/04/2015  . Cervical spondylosis with radiculopathy 02/24/2015  . Pre-syncope 02/19/2015  . URI (upper respiratory infection) 02/15/2015  . Neck pain 12/25/2014  . Pelvic pain in female 11/16/2014  . Primary osteoarthritis of hip 08/05/2014  . Heel pain 06/01/2014  . Diarrhea 06/01/2014  . Health care maintenance 06/01/2014  . Right leg pain 03/30/2014  . Right arm pain 03/30/2014  . Sinusitis 03/30/2014  . Internal nasal lesion 11/12/2013  . Other specified disorders of nose and nasal sinuses 11/12/2013  . Chronic tension-type headache, intractable 10/30/2013  . Intractable migraine without aura and without status migrainosus 10/30/2013  . Occipital neuralgia of left side 10/30/2013  . IBS (irritable bowel syndrome) 09/03/2013  . Rib pain 08/04/2013  .  Cervical radiculitis 08/01/2013  . Lumbar radiculitis 08/01/2013  . Palpitations 07/25/2013  . Benign essential hypertension 05/28/2013  . Neuralgia, neuritis, and radiculitis, unspecified 05/28/2013  . Vitamin D deficiency 05/28/2013  . Elevated AFP 04/28/2013  . Abnormality of alpha-fetoprotein 04/28/2013  . Osteoporosis 11/12/2012  . Incomplete emptying of bladder 07/11/2012  . Prolapse of vaginal vault after hysterectomy 07/11/2012  . Nonspecific (abnormal) findings on radiological and other examination of gastrointestinal tract 05/31/2012  . Bronchiectasis ([employer]) 02/05/2012  . Sleep apnea 02/05/2012  . Degenerative disc disease, cervical 02/05/2012  . Hypercholesterolemia 02/05/2012  . GERD (gastroesophageal reflux disease) 02/05/2012  . Chronic headaches 02/05/2012   Lady Deutscher PT, DPT [phone]  Lady Deutscher 11/29/2019, 2:29 PM  [facility] [address] [place], [place], [address] Phone: [phone]   Fax:  [phone]  Name: HELEM REESOR MRN: [id number] Date of Birth: 12-03-1945

## 2019-12-05 ENCOUNTER — Encounter: Payer: Self-pay | Admitting: Oncology

## 2019-12-05 ENCOUNTER — Inpatient Hospital Stay: Payer: Medicare Other

## 2019-12-05 ENCOUNTER — Other Ambulatory Visit: Payer: Self-pay

## 2019-12-05 ENCOUNTER — Inpatient Hospital Stay (HOSPITAL_BASED_OUTPATIENT_CLINIC_OR_DEPARTMENT_OTHER): Payer: Medicare Other | Admitting: Oncology

## 2019-12-05 VITALS — BP 117/71 | HR 79 | Temp 98.6°F | Resp 16 | Ht 62.0 in | Wt 163.9 lb

## 2019-12-05 DIAGNOSIS — C3412 Malignant neoplasm of upper lobe, left bronchus or lung: Secondary | ICD-10-CM

## 2019-12-05 DIAGNOSIS — Z5112 Encounter for antineoplastic immunotherapy: Secondary | ICD-10-CM

## 2019-12-05 DIAGNOSIS — D510 Vitamin B12 deficiency anemia due to intrinsic factor deficiency: Secondary | ICD-10-CM

## 2019-12-05 DIAGNOSIS — D6481 Anemia due to antineoplastic chemotherapy: Secondary | ICD-10-CM

## 2019-12-05 DIAGNOSIS — T451X5A Adverse effect of antineoplastic and immunosuppressive drugs, initial encounter: Secondary | ICD-10-CM

## 2019-12-05 LAB — CBC WITH DIFFERENTIAL/PLATELET
Abs Immature Granulocytes: 0.01 10*3/uL (ref 0.00–0.07)
Basophils Absolute: 0 10*3/uL (ref 0.0–0.1)
Basophils Relative: 0 %
Eosinophils Absolute: 0.1 10*3/uL (ref 0.0–0.5)
Eosinophils Relative: 5 %
HCT: 33.2 % — ABNORMAL LOW (ref 36.0–46.0)
Hemoglobin: 11.2 g/dL — ABNORMAL LOW (ref 12.0–15.0)
Immature Granulocytes: 0 %
Lymphocytes Relative: 18 %
Lymphs Abs: 0.5 10*3/uL — ABNORMAL LOW (ref 0.7–4.0)
MCH: 30.8 pg (ref 26.0–34.0)
MCHC: 33.7 g/dL (ref 30.0–36.0)
MCV: 91.2 fL (ref 80.0–100.0)
Monocytes Absolute: 0.3 10*3/uL (ref 0.1–1.0)
Monocytes Relative: 11 %
Neutro Abs: 2 10*3/uL (ref 1.7–7.7)
Neutrophils Relative %: 66 %
Platelets: 197 10*3/uL (ref 150–400)
RBC: 3.64 MIL/uL — ABNORMAL LOW (ref 3.87–5.11)
RDW: 13.2 % (ref 11.5–15.5)
WBC: 3.1 10*3/uL — ABNORMAL LOW (ref 4.0–10.5)
nRBC: 0 % (ref 0.0–0.2)

## 2019-12-05 LAB — COMPREHENSIVE METABOLIC PANEL
ALT: 14 U/L (ref 0–44)
AST: 19 U/L (ref 15–41)
Albumin: 3.8 g/dL (ref 3.5–5.0)
Alkaline Phosphatase: 67 U/L (ref 38–126)
Anion gap: 7 (ref 5–15)
BUN: 12 mg/dL (ref 8–23)
CO2: 28 mmol/L (ref 22–32)
Calcium: 8.9 mg/dL (ref 8.9–10.3)
Chloride: 104 mmol/L (ref 98–111)
Creatinine, Ser: 0.47 mg/dL (ref 0.44–1.00)
GFR calc Af Amer: >60 mL/min (ref >60)
GFR calc non Af Amer: >60 mL/min (ref >60)
Glucose, Bld: 96 mg/dL (ref 70–99)
Potassium: 4 mmol/L (ref 3.5–5.1)
Sodium: 139 mmol/L (ref 135–145)
Total Bilirubin: 0.5 mg/dL (ref 0.3–1.2)
Total Protein: 6.2 g/dL — ABNORMAL LOW (ref 6.5–8.1)

## 2019-12-05 MED ORDER — HEPARIN SOD (PORK) LOCK FLUSH 100 UNIT/ML IV SOLN
500.0000 [IU] | Freq: Once | INTRAVENOUS | Status: AC
  Administered 2019-12-05: 500 [IU] via INTRAVENOUS
  Filled 2019-12-05: qty 5

## 2019-12-05 MED ORDER — SODIUM CHLORIDE 0.9% FLUSH
10.0000 mL | INTRAVENOUS | Status: DC | PRN
  Administered 2019-12-05: 10 mL via INTRAVENOUS
  Filled 2019-12-05: qty 10

## 2019-12-05 MED ORDER — SODIUM CHLORIDE 0.9 % IV SOLN
10.0000 mg/kg | Freq: Once | INTRAVENOUS | Status: AC
  Administered 2019-12-05: 740 mg via INTRAVENOUS
  Filled 2019-12-05: qty 10

## 2019-12-05 MED ORDER — SODIUM CHLORIDE 0.9 % IV SOLN
Freq: Once | INTRAVENOUS | Status: AC
  Filled 2019-12-05: qty 250

## 2019-12-05 MED ORDER — HEPARIN SOD (PORK) LOCK FLUSH 100 UNIT/ML IV SOLN
INTRAVENOUS | Status: AC
  Filled 2019-12-05: qty 5

## 2019-12-05 NOTE — Progress Notes (Signed)
Pt states that after coming off anoro and being given new med she is able to cough up sputum and it is green and she feels that it is helping her

## 2019-12-05 NOTE — Progress Notes (Signed)
Hematology/Oncology Consult note [facility]  [phone] [phone]  Patient Care Team: [name], MD as PCP - General (Family Medicine) Telford Nab, RN as Registered Nurse   Name of the patient: Jennifer Hodges  [id number]  10/05/1945   Date of visit: 12/05/19  Diagnosis- Adenocarcinoma of the lung stage III T1b N2 M0  Chief complaint/ Reason for visit-on treatment assessment prior to next cycle of maintenance durvalumab  Heme/Onc history: patient is a 74 year old female who was seen by pulmonary and ENT for ongoing symptoms of bronchitis and possible laryngitis and received antibiotics for the same.She underwent CT chest in September 2020 which showed multiple bilateral lung nodules with associated mediastinal adenopathy and possible primary left upper lobe lung mass. This was followed by a PET CT scan which showed a left upper lobe lung mass measuring 1.9 x 1.2 cm with an SUV of 10.7. Conglomerate AP window adenopathy measuring 5.8 x 2.1 cm with an SUV of 14. She was also noted to have hypermetabolic left hilar adenopathy and left supraclavicular 0.7 cm lymph node with an SUV of 4.6. Noted to have a 7 mm right lower lobe lung nodule with faint metabolic activity. No evidence of other distant metastatic disease. This was followed by a CT super D chest without contrast. The right lower lobe lung nodules were somewhat larger as compared to September 2020. 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 Maintenance durvalumab started in January 2021after scan showed partial response.   Interval history-patient was started on a new medication for her cough which she says has been helping her quite a bit.  She has occasional cough with mucus production but is overall feeling good.  Vertigo has remained stable and she continues to work with physical therapy.  ECOG PS- 1 Pain scale- 0   Review of systems- Review of Systems  Constitutional: Positive for malaise/fatigue. Negative for chills, fever and weight loss.  HENT: Negative for congestion, ear discharge and nosebleeds.   Eyes: Negative for blurred vision.  Respiratory: Positive for cough. Negative for hemoptysis, sputum production, shortness of breath and wheezing.   Cardiovascular: Negative for chest pain, palpitations, orthopnea and claudication.  Gastrointestinal: Negative for abdominal pain, blood in stool, constipation, diarrhea, heartburn, melena, nausea and vomiting.  Genitourinary: Negative for dysuria, flank pain, frequency, hematuria and urgency.  Musculoskeletal: Negative for back pain, joint pain and myalgias.  Skin: Negative for rash.  Neurological: Negative for dizziness, tingling, focal weakness, seizures, weakness and headaches.  Endo/Heme/Allergies: Does not bruise/bleed easily.  Psychiatric/Behavioral: Negative for depression and suicidal ideas. The patient does not have insomnia.      Allergies  Allergen Reactions  . Aspirin Hives and Other (See Comments)    Difficulty breathing  . Celebrex [Celecoxib] Shortness Of Breath  . Morphine And Related Nausea And Vomiting and Swelling  . Adhesive [Tape] Other (See Comments)    Took top layer of skin off when removed.  . Clarithromycin Nausea And Vomiting  . Codeine Nausea And Vomiting  . Darvon [Propoxyphene Hcl] Nausea And Vomiting  . Demerol [Meperidine] Nausea And Vomiting  . Flonase [Fluticasone Propionate]  Other (See  Comments)    Fungal infection  . Simvastatin Other (See Comments)    "caused ulcers in mouth, and fever"  . Talwin [Pentazocine] Nausea And Vomiting     Past Medical History:  Diagnosis Date  . Anemia   . Asthma    No Inhalers--Dr. [name] will order as needed  . Bronchiectasis (HCC)    mild  . Chronic headaches     followed by [facility] migraines  . COPD (chronic obstructive pulmonary disease) ([place])   . DDD (degenerative disc disease), lumbar   . Diabetes mellitus without complication [facility])    "doctor says I no longer have diabetes"    . Diverticulosis   . Family history of adverse reaction to anesthesia    PONV  . Gall stones    history of  . GERD (gastroesophageal reflux disease)    EGD 8/09- non bleeding erosive gastritis, documentd esophageal ulcerations.   . Hiatal hernia   . History of pneumonia   . Hypercholesterolemia   . IBS (irritable bowel syndrome)   . Malignant neoplasm of upper lobe of left lung ([place]) 12/27/2018  . Murmur   . Osteoarthritis    lumbar disc disease, left hip  . PONV (postoperative nausea and vomiting)    "Only with last hip and I believe it was due to the morphine"   . Sleep apnea    cpap asked to bring mask and tubing  . Vertigo   . Weakness of right side of body      Past Surgical History:  Procedure Laterality Date  . ABDOMINAL HYSTERECTOMY  age 89  . ANTERIOR CERVICAL DECOMP/DISCECTOMY FUSION N/A 02/24/2015   Procedure: CERVICAL FOUR-FIVE, CERVICAL FIVE-SIX, CERVICAL SIX-SEVEN ANTERIOR CERVICAL DECOMPRESSION/DISCECTOMY FUSION ;  Surgeon: [name], MD;  Location: [place] [facility];  Service: Neurosurgery;  Laterality: N/A;  C45 C56 C67 anterior cervical decompression with fusion interbody prosthesis plating and bonegraft  . APPENDECTOMY    . BACK SURGERY     4th lumbar fusion  . BLADDER SURGERY N/A    with vaginal wall repair  . BREAST CYST ASPIRATION Bilateral    neg  . BREAST SURGERY Bilateral    cyst removed and  reduction  . CARDIAC CATHETERIZATION  2014  . CARPAL TUNNEL RELEASE Right 02/11/2016   Procedure: CARPAL TUNNEL RELEASE;  Surgeon: [name], MD;  Location: [facility] ORS;  Service: Orthopedics;  Laterality: Right;  . CATARACT EXTRACTION W/ INTRAOCULAR LENS IMPLANT Bilateral 2015  . CHOLECYSTECTOMY    . EUS N/A 05/31/2012   Procedure: UPPER ENDOSCOPIC ULTRASOUND (EUS) LINEAR;  Surgeon: [name], MD;  Location: [facility];  Service: Endoscopy;  Laterality: N/A;  . EXCISIONAL HEMORRHOIDECTOMY    . JOINT REPLACEMENT Bilateral   . KNEE ARTHROSCOPY WITH LATERAL MENISECTOMY Right 07/07/2015   Procedure: KNEE ARTHROSCOPY WITH LATERAL MENISECTOMY, PARTIAL SYNOVECTOMY;  Surgeon: [name], MD;  Location: [facility] ORS;  Service: Orthopedics;  Laterality: Right;  . LUMBAR LAMINECTOMY    . PORTA CATH INSERTION N/A 01/07/2019   Procedure: PORTA CATH INSERTION;  Surgeon: [name], MD;  Location: [place] CV LAB;  Service: Cardiovascular;  Laterality: N/A;  . REDUCTION MAMMAPLASTY  1990  . RIGHT OOPHORECTOMY    . TOTAL HIP ARTHROPLASTY Left 05/01/2014   Dr. [name]  . TOTAL HIP ARTHROPLASTY Right 08/05/2014   Procedure: TOTAL HIP ARTHROPLASTY ANTERIOR APPROACH;  Surgeon: [name], MD;  Location: [facility] ORS;  Service: Orthopedics;  Laterality: Right;  . ULNAR NERVE  TRANSPOSITION Right 02/11/2016   Procedure: ULNAR NERVE DECOMPRESSION/TRANSPOSITION;  Surgeon: [name], MD;  Location: [facility] ORS;  Service: Orthopedics;  Laterality: Right;  Marland Kitchen VIDEO BRONCHOSCOPY WITH ENDOBRONCHIAL ULTRASOUND Left 12/19/2018   Procedure: VIDEO BRONCHOSCOPY WITH ENDOBRONCHIAL ULTRASOUND, LEFT, SLEEP APNEA;  Surgeon: [name], MD;  Location: [facility] ORS;  Service: Thoracic;  Laterality: Left;    Social History   Socioeconomic History  . Marital status: Married    Spouse name: Not on file  . Number of children: Not on file  . Years of education: Not on file  . Highest education level: Not on file    Occupational History  . Not on file  Tobacco Use  . Smoking status: Former Smoker    Quit date: 06/13/2007    Years since quitting: 12.4  . Smokeless tobacco: Never Used  Vaping Use  . Vaping Use: Never used  Substance and Sexual Activity  . Alcohol use: No    Alcohol/week: 0.0 standard drinks  . Drug use: No  . Sexual activity: Never  Other Topics Concern  . Not on file  Social History Narrative  . Not on file   Social Determinants of Health   Financial Resource Strain:   . Difficulty of Paying Living Expenses: Not on file  Food Insecurity:   . Worried About Charity fundraiser in the Last Year: Not on file  . Ran Out of Food in the Last Year: Not on file  Transportation Needs:   . Lack of Transportation (Medical): Not on file  . Lack of Transportation (Non-Medical): Not on file  Physical Activity:   . Days of Exercise per Week: Not on file  . Minutes of Exercise per Session: Not on file  Stress:   . Feeling of Stress : Not on file  Social Connections:   . Frequency of Communication with Friends and Family: Not on file  . Frequency of Social Gatherings with Friends and Family: Not on file  . Attends Religious Services: Not on file  . Active Member of Clubs or Organizations: Not on file  . Attends Archivist Meetings: Not on file  . Marital Status: Not on file  Intimate Partner Violence:   . Fear of Current or Ex-Partner: Not on file  . Emotionally Abused: Not on file  . Physically Abused: Not on file  . Sexually Abused: Not on file    Family History  Problem Relation Age of Onset  . Heart disease Mother        s/p stent  . Hypertension Mother   . Hypercholesterolemia Mother   . Diabetes Father   . Stomach cancer Other        uncle  . Breast cancer Cousin   . Breast cancer Paternal Aunt      Current Outpatient Medications:  .  albuterol (PROVENTIL) (2.5 MG/3ML) 0.083% nebulizer solution, Take 2.5 mg by nebulization every 6 (six) hours as needed  for wheezing., Disp: , Rfl:  .  albuterol (VENTOLIN HFA) 108 (90 Base) MCG/ACT inhaler, Inhale 2 puffs into the lungs every 6 (six) hours as needed for wheezing or shortness of breath., Disp: 18 g, Rfl: 0 .  Bioflavonoid Products (ESTER C PO), Take 1 tablet by mouth 3 (three) times daily., Disp: , Rfl:  .  Calcium-Magnesium-Zinc (CAL-MAG-ZINC PO), Take 1 tablet by mouth daily., Disp: , Rfl:  .  cetirizine (ZYRTEC) 5 MG tablet, Take 1 tablet (5 mg total) by mouth daily. (Patient taking differently: Take 5  mg by mouth daily as needed (runny nose.). ), Disp: 30 tablet, Rfl: 0 .  CHLORASEPTIC MAX SORE THROAT 1.5-33 % LIQD, Use as directed 1 spray in the mouth or throat as needed (throat irritation.). , Disp: , Rfl:  .  Cholecalciferol (EQL VITAMIN D3) 25 MCG (1000 UT) capsule, Take 4,000 Units by mouth daily., Disp: , Rfl:  .  CHROMIUM PO, Take 1 capsule by mouth daily., Disp: , Rfl:  .  Coenzyme Q10 (CO Q 10) 100 MG CAPS, Take 100 mg by mouth daily. , Disp: , Rfl:  .  Cyanocobalamin (VITAMIN B-12) 2500 MCG SUBL, Place 2,500 mcg under the tongue daily., Disp: , Rfl:  .  cyclobenzaprine (FLEXERIL) 10 MG tablet, Take 10 mg by mouth at bedtime. , Disp: , Rfl:  .  DHEA 25 MG CAPS, Take 25 mg by mouth daily., Disp: , Rfl:  .  Ensure (ENSURE), Take 237 mLs by mouth 2 (two) times daily between meals., Disp: , Rfl:  .  ezetimibe (ZETIA) 10 MG tablet, Take 5 mg by mouth in the morning and at bedtime. , Disp: , Rfl:  .  folic acid (FOLVITE) 315 MCG tablet, Take 400 mcg by mouth 2 (two) times daily. , Disp: , Rfl:  .  Garlic 1761 MG CAPS, Take 1,000 mg by mouth daily., Disp: , Rfl:  .  Histamine Dihydrochloride (AUSTRALIAN DREAM ARTHRITIS EX), Apply 1 application topically 4 (four) times daily as needed (pain.)., Disp: , Rfl:  .  HYDROcodone-acetaminophen (NORCO/VICODIN) 5-325 MG tablet, 1-2 tabs po bid prn (Patient taking differently: Take 1 tablet by mouth at bedtime. ), Disp: 10 tablet, Rfl: 0 .   HYDROcodone-homatropine (HYCODAN) 5-1.5 MG/5ML syrup, Take 5 mLs by mouth 3 (three) times daily as needed for cough., Disp: 120 mL, Rfl: 0 .  lidocaine-prilocaine (EMLA) cream, lidocaine-prilocaine 2.5 %-2.5 % topical cream, Disp: , Rfl:  .  meclizine (ANTIVERT) 25 MG tablet, Take 1 tablet (25 mg total) by mouth every 6 (six) hours as needed for dizziness., Disp: 30 tablet, Rfl: 0 .  montelukast (SINGULAIR) 10 MG tablet, Take 10 mg by mouth at bedtime. , Disp: , Rfl:  .  Mouthwash Compounding Base (MOUTHWASH-AF PO), mouthwash  SWISH AND SWALLOW 1-2 TEASPOONFUL BY MOUTH FOUR TIMES DAILY, Disp: , Rfl:  .  NON FORMULARY, Take 8 mg by mouth in the morning, at noon, and at bedtime. Betahistine capsules 1 tid, Disp: , Rfl:  .  ondansetron (ZOFRAN-ODT) 4 MG disintegrating tablet, Take 4 mg by mouth every 8 (eight) hours as needed for nausea/vomiting., Disp: , Rfl:  .  Polyethyl Glycol-Propyl Glycol (SYSTANE) 0.4-0.3 % SOLN, Place 1 drop into both eyes 5 (five) times daily as needed (dry/irritated eyes). , Disp: , Rfl:  .  pyridOXINE (VITAMIN B-6) 100 MG tablet, Take 100 mg by mouth daily., Disp: , Rfl:  .  Selenium 200 MCG CAPS, Take 200 mcg by mouth daily., Disp: , Rfl:  .  STIOLTO RESPIMAT 2.5-2.5 MCG/ACT AERS, SMARTSIG:2 Puff(s) Via Inhaler Daily, Disp: , Rfl:  .  Vitamin A 2250 MCG (7500 UT) CAPS, Take 1 capsule by mouth daily. , Disp: , Rfl:  .  vitamin E 400 UNIT capsule, Take 400 Units by mouth daily., Disp: , Rfl:  .  Zoledronic Acid (RECLAST IV), Inject 1 Dose into the vein as directed. ONCE A YEAR, Disp: , Rfl:  .  NON FORMULARY, Take 1 capsule by mouth daily. , Disp: , Rfl:  .  sucralfate (CARAFATE) 1  g tablet, 1 tablet 3 (three) times daily as needed. , Disp: , Rfl:  No current facility-administered medications for this visit.  Facility-Administered Medications Ordered in Other Visits:  .  heparin lock flush 100 unit/mL, 500 Units, Intravenous, Once, Sindy Guadeloupe, MD .  sodium chloride  flush (NS) 0.9 % injection 10 mL, 10 mL, Intravenous, PRN, Sindy Guadeloupe, MD, 10 mL at 12/05/19 0824  Physical exam:  Vitals:   12/05/19 0913  BP: 117/71  Pulse: 79  Resp: 16  Temp: 98.6 F (37 C)  Weight: 163 lb 14.4 oz (74.3 kg)  Height: $Remove'5\' 2"'SdEnORT$  (1.575 m)   Physical Exam Cardiovascular:     Rate and Rhythm: Normal rate and regular rhythm.     Heart sounds: Normal heart sounds.  Pulmonary:     Effort: Pulmonary effort is normal.     Breath sounds: Normal breath sounds.  Abdominal:     General: Bowel sounds are normal.     Palpations: Abdomen is soft.  Skin:    General: Skin is warm and dry.  Neurological:     Mental Status: She is alert and oriented to person, place, and time.      CMP Latest Ref Rng & Units 12/05/2019  Glucose 70 - 99 mg/dL 96  BUN 8 - 23 mg/dL 12  Creatinine 0.44 - 1.00 mg/dL 0.47  Sodium 135 - 145 mmol/L 139  Potassium 3.5 - 5.1 mmol/L 4.0  Chloride 98 - 111 mmol/L 104  CO2 22 - 32 mmol/L 28  Calcium 8.9 - 10.3 mg/dL 8.9  Total Protein 6.5 - 8.1 g/dL 6.2(L)  Total Bilirubin 0.3 - 1.2 mg/dL 0.5  Alkaline Phos 38 - 126 U/L 67  AST 15 - 41 U/L 19  ALT 0 - 44 U/L 14   CBC Latest Ref Rng & Units 12/05/2019  WBC 4.0 - 10.5 K/uL 3.1(L)  Hemoglobin 12.0 - 15.0 g/dL 11.2(L)  Hematocrit 36 - 46 % 33.2(L)  Platelets 150 - 400 K/uL 197      Assessment and plan- Patient is a 74 y.o. female  with stage III adenocarcinoma of the lung.   She is here for on treatment assessment prior to next cycle of maintenance durvalumab  Counts okay to proceed with cycle 17 of durvalumab today.  I will see her back in 2 weeks for cycle 18 with CBC with differential CMP and TSH.Hemoglobin is remained stable around 11.  She does have mild leukopenia and her white count fluctuates between 3.1-3.8.  Differential shows mainly lymphopenia with an adequate neutrophil count.  Continue to monitor  She is due for repeat scans next month  Visit Diagnosis 1. Encounter for  antineoplastic immunotherapy   2. Malignant neoplasm of upper lobe of left lung ([place])   3. Antineoplastic chemotherapy induced anemia   4. Vitamin B12 deficiency anemia due to intrinsic factor deficiency       Dr. Randa Evens, MD, MPH [facility] [phone] 12/05/2019 9:10 AM

## 2019-12-06 ENCOUNTER — Encounter: Payer: Self-pay | Admitting: Physical Therapy

## 2019-12-06 ENCOUNTER — Other Ambulatory Visit: Payer: Self-pay

## 2019-12-06 ENCOUNTER — Ambulatory Visit: Payer: Medicare Other | Admitting: Physical Therapy

## 2019-12-06 DIAGNOSIS — R42 Dizziness and giddiness: Secondary | ICD-10-CM | POA: Diagnosis not present

## 2019-12-06 DIAGNOSIS — M6281 Muscle weakness (generalized): Secondary | ICD-10-CM

## 2019-12-06 DIAGNOSIS — R2681 Unsteadiness on feet: Secondary | ICD-10-CM

## 2019-12-06 NOTE — Therapy (Signed)
[facility] [address] [place], [place], [address] Phone: [phone]   Fax:  [phone]  Physical Therapy Treatment  Patient Details  Name: Jennifer Hodges MRN: [id number] Date of Birth: 17-Feb-1946 Referring Provider (PT): Brett Fairy, [place]   Encounter Date: 12/06/2019   PT End of Session - 12/06/19 0958    Visit Number 3    Number of Visits 9    Date for PT Re-Evaluation 01/24/20    Authorization Type Initial Eval 11/22/19 FOTO    PT Start Time 0900    PT Stop Time 0947    PT Time Calculation (min) 47 min    Equipment Utilized During Treatment Gait belt    Activity Tolerance Patient tolerated treatment well    Behavior During Therapy [facility] for tasks assessed/performed           Past Medical History:  Diagnosis Date  . Anemia   . Asthma    No Inhalers--Dr. [name] will order as needed  . Bronchiectasis (HCC)    mild  . Chronic headaches     followed by [facility] migraines  . COPD (chronic obstructive pulmonary disease) ([place])   . DDD (degenerative disc disease), lumbar   . Diabetes mellitus without complication [facility])    "doctor says I no longer have diabetes"    . Diverticulosis   . Family history of adverse reaction to anesthesia    PONV  . Gall stones    history of  . GERD (gastroesophageal reflux disease)    EGD 8/09- non bleeding erosive gastritis, documentd esophageal ulcerations.   . Hiatal hernia   . History of pneumonia   . Hypercholesterolemia   . IBS (irritable bowel syndrome)   . Malignant neoplasm of upper lobe of left lung ([place]) 12/27/2018  . Meniere disease   . Murmur   . Osteoarthritis    lumbar disc disease, left hip  . PONV (postoperative nausea and vomiting)    "Only with last hip and I believe it was due to the morphine"   . Sleep apnea    cpap asked to bring mask and tubing  . Vertigo   . Weakness of right side of body     Past Surgical History:  Procedure Laterality Date  .  ABDOMINAL HYSTERECTOMY  age 13  . ANTERIOR CERVICAL DECOMP/DISCECTOMY FUSION N/A 02/24/2015   Procedure: CERVICAL FOUR-FIVE, CERVICAL FIVE-SIX, CERVICAL SIX-SEVEN ANTERIOR CERVICAL DECOMPRESSION/DISCECTOMY FUSION ;  Surgeon: [name], MD;  Location: [place] [facility];  Service: Neurosurgery;  Laterality: N/A;  C45 C56 C67 anterior cervical decompression with fusion interbody prosthesis plating and bonegraft  . APPENDECTOMY    . BACK SURGERY     4th lumbar fusion  . BLADDER SURGERY N/A    with vaginal wall repair  . BREAST CYST ASPIRATION Bilateral    neg  . BREAST SURGERY Bilateral    cyst removed and reduction  . CARDIAC CATHETERIZATION  2014  . CARPAL TUNNEL RELEASE Right 02/11/2016   Procedure: CARPAL TUNNEL RELEASE;  Surgeon: [name], MD;  Location: [facility] ORS;  Service: Orthopedics;  Laterality: Right;  . CATARACT EXTRACTION W/ INTRAOCULAR LENS IMPLANT Bilateral 2015  . CHOLECYSTECTOMY    . EUS N/A 05/31/2012   Procedure: UPPER ENDOSCOPIC ULTRASOUND (EUS) LINEAR;  Surgeon: [name], MD;  Location: [facility];  Service: Endoscopy;  Laterality: N/A;  . EXCISIONAL HEMORRHOIDECTOMY    . JOINT REPLACEMENT Bilateral   . KNEE ARTHROSCOPY WITH LATERAL MENISECTOMY Right 07/07/2015  Procedure: KNEE ARTHROSCOPY WITH LATERAL MENISECTOMY, PARTIAL SYNOVECTOMY;  Surgeon: [name], MD;  Location: [facility] ORS;  Service: Orthopedics;  Laterality: Right;  . LUMBAR LAMINECTOMY    . PORTA CATH INSERTION N/A 01/07/2019   Procedure: PORTA CATH INSERTION;  Surgeon: [name], MD;  Location: [facility];  Service: Cardiovascular;  Laterality: N/A;  . REDUCTION MAMMAPLASTY  1990  . RIGHT OOPHORECTOMY    . TOTAL HIP ARTHROPLASTY Left 05/01/2014   Dr. [name]  . TOTAL HIP ARTHROPLASTY Right 08/05/2014   Procedure: TOTAL HIP ARTHROPLASTY ANTERIOR APPROACH;  Surgeon: [name], MD;  Location: [facility] ORS;  Service: Orthopedics;  Laterality: Right;  . ULNAR NERVE TRANSPOSITION  Right 02/11/2016   Procedure: ULNAR NERVE DECOMPRESSION/TRANSPOSITION;  Surgeon: [name], MD;  Location: [facility] ORS;  Service: Orthopedics;  Laterality: Right;  Marland Kitchen VIDEO BRONCHOSCOPY WITH ENDOBRONCHIAL ULTRASOUND Left 12/19/2018   Procedure: VIDEO BRONCHOSCOPY WITH ENDOBRONCHIAL ULTRASOUND, LEFT, SLEEP APNEA;  Surgeon: [name], MD;  Location: [facility] ORS;  Service: Thoracic;  Laterality: Left;    There were no vitals filed for this visit.   Subjective Assessment - 12/06/19 0902    Subjective Patient states she did her exercises every day except yesterday becuase she had an infusion treatment yesterday that took hours. Patient reports she has been sore form doing the leg exercises, but setates it has gradually gotten better.    Pertinent History Patient reports that she has had follow-up appointments at ENT office and states she is being worked up for [place]. Patient states she is following the Meniere's low sodium diet. Patient reports that she was given Betahistine pills for 30 days and if her headaches are not better they are going to do a brain MRI. Patient reports she has a family history of Meniere's disease and migraines. Patient reports she has to be careful when she lies down on the left side, she gets a falling and vertigo sensation which lasts until she falls asleep. When she wakes up, she says she still has the same sensation when she sits up. Patient reports she had another hearing test and she reports she does have bilateral hearing loss. Patient reports she uses a RW. Patient reports she veers to the left when she walks.  Patient states she does not drive do to her symptoms.  Patient reports the Meclizine is helping with her nausea. Patient reports she has constant headache that never goes away. She describes it as a dull headache. Patient reports that she has had dizzy spells in the past, but this was more than her usual symptoms. Patient reports she began to have dizziness  again last year and then patient began to have symptoms of vertigo, nausea and vomiting in February 2021 when she woke up in the middle of the night with a bad headache and she tried to lift her head off the pillow.  Patient states she was nauseous and could not sleep on her left side at all. Patient reports that she was been seen by Dr. Pryor Ochoa and Dr. 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 Patient received Epley maneuver at ENT office for left BPPV in March. Patient states the treatments helped but it is still there. Patient had VNG study on 06/19/2019 that revealed abnormal smooth pursuits, saccades and all OPK's. 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 Patient  reports that Dr. Pryor Ochoa felt that the chemotherapy drugs might have caused the inner ear damage and balance difficulties. Patient describes her dizziness as vertigo, unsteadiness and lightheadedness.    Diagnostic tests MRI brain- normal study for age    Patient Stated Goals decreased dizziness, improved balance, improved walking             Therapeutic Exercise:  Nustep: Patient performed Nu-step machine on level 3 for 5 minutes during intake.   Theraband exercises: In sitting performed resisted hip abduction and hip abduction with yellow T band 2 sets of 10 reps each with cueing for technique.  In sitting, performed hip flexion with with 1 ankle pound weights 1 set of 10 reps and then repeated with no weight on right lower extremity and 1 pound weight on left lower extremity 1 set of 10 reps.  Patient fatigues towards end of reps bilaterally with all of the above activities.  Standing Hip exercises: Patient performed in // bars holding with B UEs hip flexion, hip abduction and hip extension with #1 ankle weights 2 sets of 10 reps.   Performed 20 reps heel  raises.  Neuromuscular Re-education:  VOR X 1 exercise:  Patient performed VOR X 1 horizontal in standing with with plain background 1 rep of 1 minute.  Patient reports no dizziness. Patient performed VOR x1 in standing with conflicting background 2 reps of 1 minute each demonstrating good technique. Patient reports no dizziness with this activity. Discussed with patient decreasing frequency of performing VOR 1 with home exercise program to 2 times a week to see if she is able to maintain decrease dizziness.  Step Ups: Performed 10 reps sidestepping up over and return on 6 inch wooden step with light upper extremity support in parallel bars with CGA. Patient performed 10 reps forward stepping up, over and return on 6 inch wooden step with light upper extremity support in parallel bars with CGA.    PT Education - 12/06/19 1106    Education Details Discussed exercise technique; educated as to VOR x1 with conflicting background, discussed HEP    Person(s) Educated Patient    Methods Explanation;Verbal cues    Comprehension Verbalized understanding            PT Short Term Goals - 11/22/19 1113      PT SHORT TERM GOAL #1   Title Patient will be independent in home exercise program to improve strength/mobility for better functional independence with ADLs and for self-management.    Time 4    Period Weeks    Status New    Target Date 12/20/19             PT Long Term Goals - 11/22/19 1127      PT LONG TERM GOAL #1   Title Patient will reduce falls risk as indicated by Activities Specific Balance Confidence Scale (ABC) >67%.    Baseline scored 13.3% on 07/19/2019; scored 45% on 08/30/19; SCORED 23.8% on 11/22/19    Time 8    Period Weeks    Status Partially Met    Target Date 01/17/20      PT LONG TERM GOAL #2   Title Patient will reduce perceived disability to moderate levels as indicated by <70 on Dizziness Handicap Inventory [facility]).    Baseline scored 92/100 on 07/19/19;  scored 28/100 on 08/30/19; SCORED 74/100 on 11/22/19    Time 8    Period Weeks    Status Achieved    Target Date  01/17/20      PT LONG TERM GOAL #3   Title Patient will have improved functional status as evidenced by an increase of 10 points or more on the FOTO score.    Baseline scored 45/100 on 07/19/19; scored 52/100 on 08/30/19; SCORED 51/100 on 11/22/19    Time 8    Period Weeks    Status Partially Met    Target Date 01/17/20      PT LONG TERM GOAL #4   Title Patient will demonstrate reduced falls risk as evidenced by Dynamic Gait Index (DGI) 18/24 or more for improved safety with ADLs and community mobility.    Baseline Scored 13/24 on 11/22/19                 Plan - 12/06/19 1110    Clinical Impression Statement Patient reports good compliance with home exercise program and is motivated to session.  Patient reports no dizziness this past week and no dizziness during session this date.  Perform seated Thera-Band hip exercises with yellow T band as read T band provided too much resistance for patient at this time.  Patient fatigued towards end reps of all strengthening exercises this date and required min cueing for exercise technique.  Patient with bilateral week hip musculature and would benefit from continued PT services to address strengthening and balance deficits.    Personal Factors and Comorbidities Past/Current Experience;Comorbidity 3+;Time since onset of injury/illness/exacerbation    Comorbidities intractable migraine, left sided occipital neuralgia, COPD, sleep apnea, lung CA, chronic tension headaches    Examination-Activity Limitations Bed Mobility;Bend;Reach Overhead;Locomotion Level;Stairs;Transfers    Examination-Participation Restrictions Cleaning;Driving;Yard Work    [employer] Evolving/Moderate complexity    Rehab Potential Fair    PT Frequency 1x / week    PT Duration 8 weeks    PT Treatment/Interventions Canalith Repostioning;Gait  training;Stair training;Therapeutic activities;Therapeutic exercise;Balance training;Neuromuscular re-education;Vestibular;Patient/family education    PT Next Visit Plan review HEP; CTSIB; strengthening and balance exercises    PT Home Exercise Plan sit to stand and VOR X 1 in sitting    Consulted and Agree with Plan of Care Patient           Patient will benefit from skilled therapeutic intervention in order to improve the following deficits and impairments:  Decreased balance, Difficulty walking, Dizziness, Decreased strength  Visit Diagnosis: Dizziness and giddiness  Unsteadiness on feet  Muscle weakness (generalized)     Problem List Patient Active Problem List   Diagnosis Date Noted  . Benign breast lumps 01/11/2019  . Gout 01/11/2019  . Hives 01/11/2019  . Lung disease 01/11/2019  . Migraine headache 01/11/2019  . Goals of care, counseling/discussion 12/27/2018  . Malignant neoplasm of upper lobe of left lung ([place]) 12/27/2018  . Night sweats 08/07/2016  . 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 Osteopenia of multiple sites 04/27/2016  . Left carpal tunnel syndrome 02/02/2016  . Carotid stenosis, asymptomatic, bilateral 12/29/2015  . Prediabetes 09/28/2015  . Primary osteoarthritis involving multiple joints 09/28/2015  . Knee pain 05/11/2015  . Chest tightness 03/24/2015  . Stool incontinence 03/24/2015  . Urinary frequency 03/04/2015  . Cervical spondylosis with radiculopathy 02/24/2015  . Pre-syncope 02/19/2015  . URI (upper respiratory infection) 02/15/2015  . Neck pain 12/25/2014  . Pelvic pain in female 11/16/2014  . Primary osteoarthritis of hip 08/05/2014  . Heel pain 06/01/2014  . Diarrhea 06/01/2014  . Health care maintenance 06/01/2014  . Right leg pain 03/30/2014  . Right arm pain 03/30/2014  .  Sinusitis 03/30/2014  . Internal nasal lesion 11/12/2013  . Other specified disorders of nose and nasal sinuses 11/12/2013  . Chronic tension-type  headache, intractable 10/30/2013  . Intractable migraine without aura and without status migrainosus 10/30/2013  . Occipital neuralgia of left side 10/30/2013  . IBS (irritable bowel syndrome) 09/03/2013  . Rib pain 08/04/2013  . Cervical radiculitis 08/01/2013  . Lumbar radiculitis 08/01/2013  . Palpitations 07/25/2013  . Benign essential hypertension 05/28/2013  . Neuralgia, neuritis, and radiculitis, unspecified 05/28/2013  . Vitamin D deficiency 05/28/2013  . Elevated AFP 04/28/2013  . Abnormality of alpha-fetoprotein 04/28/2013  . Osteoporosis 11/12/2012  . Incomplete emptying of bladder 07/11/2012  . Prolapse of vaginal vault after hysterectomy 07/11/2012  . Nonspecific (abnormal) findings on radiological and other examination of gastrointestinal tract 05/31/2012  . Bronchiectasis ([place]) 02/05/2012  . Sleep apnea 02/05/2012  . Degenerative disc disease, cervical 02/05/2012  . Hypercholesterolemia 02/05/2012  . GERD (gastroesophageal reflux disease) 02/05/2012  . Chronic headaches 02/05/2012   Lady Deutscher PT, DPT [id number] Lady Deutscher 12/06/2019, 11:25 AM  [facility] [address] [place], [place], [address] Phone: [phone]   Fax:  [phone]  Name: Jennifer Hodges MRN: [id number] Date of Birth: 11/15/1945

## 2019-12-11 ENCOUNTER — Other Ambulatory Visit: Payer: Self-pay

## 2019-12-11 ENCOUNTER — Ambulatory Visit: Payer: Medicare Other | Admitting: Physical Therapy

## 2019-12-11 DIAGNOSIS — M6281 Muscle weakness (generalized): Secondary | ICD-10-CM

## 2019-12-11 DIAGNOSIS — R42 Dizziness and giddiness: Secondary | ICD-10-CM

## 2019-12-11 DIAGNOSIS — R2681 Unsteadiness on feet: Secondary | ICD-10-CM

## 2019-12-11 NOTE — Therapy (Signed)
[facility] [address] [place], [place], [address] Phone: [phone]   Fax:  [phone]  Physical Therapy Treatment  Patient Details  Name: Jennifer Hodges MRN: [id number] Date of Birth: October 20, 1945 Referring Provider (PT): Brett Fairy, [place]   Encounter Date: 12/11/2019   PT End of Session - 12/11/19 1303    Visit Number 4    Number of Visits 9    Date for PT Re-Evaluation 01/24/20    Authorization Type Initial Eval 11/22/19 FOTO    PT Start Time 1300    PT Stop Time 1346    PT Time Calculation (min) 46 min    Equipment Utilized During Treatment Gait belt    Activity Tolerance Patient tolerated treatment well    Behavior During Therapy [facility] for tasks assessed/performed           Past Medical History:  Diagnosis Date  . Anemia   . Asthma    No Inhalers--Dr. [name] will order as needed  . Bronchiectasis (HCC)    mild  . Chronic headaches     followed by [facility] migraines  . COPD (chronic obstructive pulmonary disease) ([place])   . DDD (degenerative disc disease), lumbar   . Diabetes mellitus without complication [facility])    "doctor says I no longer have diabetes"    . Diverticulosis   . Family history of adverse reaction to anesthesia    PONV  . Gall stones    history of  . GERD (gastroesophageal reflux disease)    EGD 8/09- non bleeding erosive gastritis, documentd esophageal ulcerations.   . Hiatal hernia   . History of pneumonia   . Hypercholesterolemia   . IBS (irritable bowel syndrome)   . Malignant neoplasm of upper lobe of left lung ([place]) 12/27/2018  . Meniere disease   . Murmur   . Osteoarthritis    lumbar disc disease, left hip  . PONV (postoperative nausea and vomiting)    "Only with last hip and I believe it was due to the morphine"   . Sleep apnea    cpap asked to bring mask and tubing  . Vertigo   . Weakness of right side of body     Past Surgical History:  Procedure Laterality Date  .  ABDOMINAL HYSTERECTOMY  age 74  . ANTERIOR CERVICAL DECOMP/DISCECTOMY FUSION N/A 02/24/2015   Procedure: CERVICAL FOUR-FIVE, CERVICAL FIVE-SIX, CERVICAL SIX-SEVEN ANTERIOR CERVICAL DECOMPRESSION/DISCECTOMY FUSION ;  Surgeon: [name], MD;  Location: [facility];  Service: Neurosurgery;  Laterality: N/A;  C45 C56 C67 anterior cervical decompression with fusion interbody prosthesis plating and bonegraft  . APPENDECTOMY    . BACK SURGERY     4th lumbar fusion  . BLADDER SURGERY N/A    with vaginal wall repair  . BREAST CYST ASPIRATION Bilateral    neg  . BREAST SURGERY Bilateral    cyst removed and reduction  . CARDIAC CATHETERIZATION  2014  . CARPAL TUNNEL RELEASE Right 02/11/2016   Procedure: CARPAL TUNNEL RELEASE;  Surgeon: [name], MD;  Location: [facility] ORS;  Service: Orthopedics;  Laterality: Right;  . CATARACT EXTRACTION W/ INTRAOCULAR LENS IMPLANT Bilateral 2015  . CHOLECYSTECTOMY    . EUS N/A 05/31/2012   Procedure: UPPER ENDOSCOPIC ULTRASOUND (EUS) LINEAR;  Surgeon: [name], MD;  Location: [facility];  Service: Endoscopy;  Laterality: N/A;  . EXCISIONAL HEMORRHOIDECTOMY    . JOINT REPLACEMENT Bilateral   . KNEE ARTHROSCOPY WITH LATERAL MENISECTOMY Right 07/07/2015  Procedure: KNEE ARTHROSCOPY WITH LATERAL MENISECTOMY, PARTIAL SYNOVECTOMY;  Surgeon: [name], MD;  Location: [facility] ORS;  Service: Orthopedics;  Laterality: Right;  . LUMBAR LAMINECTOMY    . PORTA CATH INSERTION N/A 01/07/2019   Procedure: PORTA CATH INSERTION;  Surgeon: [name], MD;  Location: [place] [facility] CV LAB;  Service: Cardiovascular;  Laterality: N/A;  . REDUCTION MAMMAPLASTY  1990  . RIGHT OOPHORECTOMY    . TOTAL HIP ARTHROPLASTY Left 05/01/2014   Dr. [name]  . TOTAL HIP ARTHROPLASTY Right 08/05/2014   Procedure: TOTAL HIP ARTHROPLASTY ANTERIOR APPROACH;  Surgeon: [name], MD;  Location: [facility] ORS;  Service: Orthopedics;  Laterality: Right;  . ULNAR NERVE TRANSPOSITION  Right 02/11/2016   Procedure: ULNAR NERVE DECOMPRESSION/TRANSPOSITION;  Surgeon: [name], MD;  Location: [facility] ORS;  Service: Orthopedics;  Laterality: Right;  Marland Kitchen VIDEO BRONCHOSCOPY WITH ENDOBRONCHIAL ULTRASOUND Left 12/19/2018   Procedure: VIDEO BRONCHOSCOPY WITH ENDOBRONCHIAL ULTRASOUND, LEFT, SLEEP APNEA;  Surgeon: [name], MD;  Location: [facility] ORS;  Service: Thoracic;  Laterality: Left;    There were no vitals filed for this visit.   Subjective Assessment - 12/11/19 1300    Subjective Patient reports the soreness in legs is getting better.  Patient reports she has an appointment with Dr. Rudene Christians on Monday for her right knee.  Patient reports no dizziness symptoms this past week.    Pertinent History Patient reports that she has had follow-up appointments at ENT office and states she is being worked up for [place]. Patient states she is following the Meniere's low sodium diet. Patient reports that she was given Betahistine pills for 30 days and if her headaches are not better they are going to do a brain MRI. Patient reports she has a family history of Meniere's disease and migraines. Patient reports she has to be careful when she lies down on the left side, she gets a falling and vertigo sensation which lasts until she falls asleep. When she wakes up, she says she still has the same sensation when she sits up. Patient reports she had another hearing test and she reports she does have bilateral hearing loss. Patient reports she uses a RW. Patient reports she veers to the left when she walks.  Patient states she does not drive do to her symptoms.  Patient reports the Meclizine is helping with her nausea. Patient reports she has constant headache that never goes away. She describes it as a dull headache. Patient reports that she has had dizzy spells in the past, but this was more than her usual symptoms. Patient reports she began to have dizziness again last year and then patient began to  have symptoms of vertigo, nausea and vomiting in February 2021 when she woke up in the middle of the night with a bad headache and she tried to lift her head off the pillow.  Patient states she was nauseous and could not sleep on her left side at all. Patient reports that she was been seen by Dr. Pryor Ochoa and Dr. 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 Patient received Epley maneuver at ENT office for left BPPV in March. Patient states the treatments helped but it is still there. Patient had VNG study on 06/19/2019 that revealed abnormal smooth pursuits, saccades and all OPK's. 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 Patient reports that Dr. Pryor Ochoa  felt that the chemotherapy drugs might have caused the inner ear damage and balance difficulties. Patient describes her dizziness as vertigo, unsteadiness and lightheadedness.    Diagnostic tests MRI brain- normal study for age    Patient Stated Goals decreased dizziness, improved balance, improved walking           Therapeutic exercise:  Nustep: Patient performed Nu-step machine on level 4 for 5 minutes during intake.  Patient performed in sitting resisted hip adduction and hip abduction with yellow Thera-Band 2 sets of 10 reps. Patient performed in sitting with 1# ankle weights 2 sets of 15 reps long arc quads and hip flexion. Patient fatiguing towards last few reps during all of the above activities.  Performed 2 sets of 15 reps mini squats with vc for technique. Patient holding // bar. Patient performed in sitting with 1# ankle weights 2 sets of 15 reps long arc quads and hip flexion. Performed standing hip flexion, hip abduction and extension 2 sets of 15 reps with #1 weights.     PT Education - 12/11/19 1302    Education Details reviewed HEP; technique during  session; med bridge Access Code [email or web address]-seated hip abduction with yellow Thera-Band, standing at kitchen countertop for safety hip flexion, hip abduction, hip extension.    Person(s) Educated Patient    Methods Explanation;Demonstration;Verbal cues;Handout    Comprehension Verbalized understanding;Returned demonstration            PT Short Term Goals - 11/22/19 1113      PT SHORT TERM GOAL #1   Title Patient will be independent in home exercise program to improve strength/mobility for better functional independence with ADLs and for self-management.    Time 4    Period Weeks    Status New    Target Date 12/20/19             PT Long Term Goals - 11/22/19 1127      PT LONG TERM GOAL #1   Title Patient will reduce falls risk as indicated by Activities Specific Balance Confidence Scale (ABC) >67%.    Baseline scored 13.3% on 07/19/2019; scored 45% on 08/30/19; SCORED 23.8% on 11/22/19    Time 8    Period Weeks    Status Partially Met    Target Date 01/17/20      PT LONG TERM GOAL #2   Title Patient will reduce perceived disability to moderate levels as indicated by <70 on Dizziness Handicap Inventory [facility]).    Baseline scored 92/100 on 07/19/19; scored 28/100 on 08/30/19; SCORED 74/100 on 11/22/19    Time 8    Period Weeks    Status Achieved    Target Date 01/17/20      PT LONG TERM GOAL #3   Title Patient will have improved functional status as evidenced by an increase of 10 points or more on the FOTO score.    Baseline scored 45/100 on 07/19/19; scored 52/100 on 08/30/19; SCORED 51/100 on 11/22/19    Time 8    Period Weeks    Status Partially Met    Target Date 01/17/20      PT LONG TERM GOAL #4   Title Patient will demonstrate reduced falls risk as evidenced by Dynamic Gait Index (DGI) 18/24 or more for improved safety with ADLs and community mobility.    Baseline Scored 13/24 on 11/22/19                 Plan - 12/19/19 1756    Clinical  Impression Statement Patient  progressing with lower extermity strengthening. Patient able to increase exercise repeitions with seated and standing lower extremity exercises. Patient issued standing hip flexion, hip abduction and extension and seated yellow Theraband hip abduction and adduction for home exercise program. Patient would benefit from continued PT to work on further progressions of balance and strenghtening activities and in order to address goals.    Personal Factors and Comorbidities Past/Current Experience;Comorbidity 3+;Time since onset of injury/illness/exacerbation    Comorbidities intractable migraine, left sided occipital neuralgia, COPD, sleep apnea, lung CA, chronic tension headaches    Examination-Activity Limitations Bed Mobility;Bend;Reach Overhead;Locomotion Level;Stairs;Transfers    Examination-Participation Restrictions Cleaning;Driving;Yard Work    [employer] Evolving/Moderate complexity    Rehab Potential Fair    PT Frequency 1x / week    PT Duration 8 weeks    PT Treatment/Interventions Canalith Repostioning;Gait training;Stair training;Therapeutic activities;Therapeutic exercise;Balance training;Neuromuscular re-education;Vestibular;Patient/family education    PT Next Visit Plan review HEP; CTSIB; strengthening and balance exercises    PT Home Exercise Plan sit to stand and VOR X 1 in sitting; MedBridge Access Code [email or web address]    Consulted and Agree with Plan of Care Patient           Patient will benefit from skilled therapeutic intervention in order to improve the following deficits and impairments:  Decreased balance, Difficulty walking, Dizziness, Decreased strength  Visit Diagnosis: Dizziness and giddiness  Unsteadiness on feet  Muscle weakness (generalized)     Problem List Patient Active Problem List   Diagnosis Date Noted  . Benign breast lumps 01/11/2019  . Gout 01/11/2019  . Hives 01/11/2019  . Lung disease 01/11/2019  . Migraine headache  01/11/2019  . Goals of care, counseling/discussion 12/27/2018  . Malignant neoplasm of upper lobe of left lung ([place]) 12/27/2018  . Night sweats 08/07/2016  . 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 Osteopenia of multiple sites 04/27/2016  . Left carpal tunnel syndrome 02/02/2016  . Carotid stenosis, asymptomatic, bilateral 12/29/2015  . Prediabetes 09/28/2015  . Primary osteoarthritis involving multiple joints 09/28/2015  . Knee pain 05/11/2015  . Chest tightness 03/24/2015  . Stool incontinence 03/24/2015  . Urinary frequency 03/04/2015  . Cervical spondylosis with radiculopathy 02/24/2015  . Pre-syncope 02/19/2015  . URI (upper respiratory infection) 02/15/2015  . Neck pain 12/25/2014  . Pelvic pain in female 11/16/2014  . Primary osteoarthritis of hip 08/05/2014  . Heel pain 06/01/2014  . Diarrhea 06/01/2014  . Health care maintenance 06/01/2014  . Right leg pain 03/30/2014  . Right arm pain 03/30/2014  . Sinusitis 03/30/2014  . Internal nasal lesion 11/12/2013  . Other specified disorders of nose and nasal sinuses 11/12/2013  . Chronic tension-type headache, intractable 10/30/2013  . Intractable migraine without aura and without status migrainosus 10/30/2013  . Occipital neuralgia of left side 10/30/2013  . IBS (irritable bowel syndrome) 09/03/2013  . Rib pain 08/04/2013  . Cervical radiculitis 08/01/2013  . Lumbar radiculitis 08/01/2013  . Palpitations 07/25/2013  . Benign essential hypertension 05/28/2013  . Neuralgia, neuritis, and radiculitis, unspecified 05/28/2013  . Vitamin D deficiency 05/28/2013  . Elevated AFP 04/28/2013  . Abnormality of alpha-fetoprotein 04/28/2013  . Osteoporosis 11/12/2012  . Incomplete emptying of bladder 07/11/2012  . Prolapse of vaginal vault after hysterectomy 07/11/2012  . Nonspecific (abnormal) findings on radiological and other examination of gastrointestinal tract 05/31/2012  . Bronchiectasis ([place]) 02/05/2012  . Sleep apnea  02/05/2012  . Degenerative disc disease, cervical 02/05/2012  . Hypercholesterolemia 02/05/2012  . GERD (gastroesophageal reflux  disease) 02/05/2012  . Chronic headaches 02/05/2012   Lady Deutscher PT, DPT [phone] Lady Deutscher 12/11/2019, 1:48 PM  [facility] [address] [place], [place], [address] Phone: [phone]   Fax:  [phone]  Name: Jennifer Hodges MRN: [id number] Date of Birth: 1945-07-27

## 2019-12-12 ENCOUNTER — Other Ambulatory Visit: Payer: Self-pay | Admitting: Oncology

## 2019-12-18 ENCOUNTER — Encounter: Payer: Self-pay | Admitting: Oncology

## 2019-12-18 NOTE — Progress Notes (Signed)
Pt has pain every day - today she was helping her husband to clean deck and her neck and shoulders hurt- 7 pain scale. Most days she says her back and knees hurts but it has been there for a while. 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 Her bowels are a struggles sometimes but the know when she needs to use miralax and take extra colace.

## 2019-12-19 ENCOUNTER — Inpatient Hospital Stay (HOSPITAL_BASED_OUTPATIENT_CLINIC_OR_DEPARTMENT_OTHER): Payer: Medicare Other | Admitting: Oncology

## 2019-12-19 ENCOUNTER — Inpatient Hospital Stay: Payer: Medicare Other

## 2019-12-19 ENCOUNTER — Encounter: Payer: Self-pay | Admitting: Oncology

## 2019-12-19 ENCOUNTER — Inpatient Hospital Stay: Payer: Medicare Other | Attending: Oncology

## 2019-12-19 ENCOUNTER — Other Ambulatory Visit: Payer: Self-pay

## 2019-12-19 ENCOUNTER — Other Ambulatory Visit: Payer: Self-pay | Admitting: *Deleted

## 2019-12-19 VITALS — BP 126/61 | HR 78 | Temp 98.0°F | Resp 20 | Wt 163.6 lb

## 2019-12-19 DIAGNOSIS — C3412 Malignant neoplasm of upper lobe, left bronchus or lung: Secondary | ICD-10-CM | POA: Diagnosis not present

## 2019-12-19 DIAGNOSIS — D6481 Anemia due to antineoplastic chemotherapy: Secondary | ICD-10-CM

## 2019-12-19 DIAGNOSIS — Z5112 Encounter for antineoplastic immunotherapy: Secondary | ICD-10-CM

## 2019-12-19 DIAGNOSIS — D510 Vitamin B12 deficiency anemia due to intrinsic factor deficiency: Secondary | ICD-10-CM

## 2019-12-19 DIAGNOSIS — Z79899 Other long term (current) drug therapy: Secondary | ICD-10-CM | POA: Insufficient documentation

## 2019-12-19 LAB — TSH: TSH: 2.536 u[IU]/mL (ref 0.350–4.500)

## 2019-12-19 LAB — CBC WITH DIFFERENTIAL/PLATELET
Abs Immature Granulocytes: 0.02 10*3/uL (ref 0.00–0.07)
Basophils Absolute: 0 10*3/uL (ref 0.0–0.1)
Basophils Relative: 1 %
Eosinophils Absolute: 0.2 10*3/uL (ref 0.0–0.5)
Eosinophils Relative: 5 %
HCT: 34.5 % — ABNORMAL LOW (ref 36.0–46.0)
Hemoglobin: 11.8 g/dL — ABNORMAL LOW (ref 12.0–15.0)
Immature Granulocytes: 1 %
Lymphocytes Relative: 15 %
Lymphs Abs: 0.7 10*3/uL (ref 0.7–4.0)
MCH: 31.3 pg (ref 26.0–34.0)
MCHC: 34.2 g/dL (ref 30.0–36.0)
MCV: 91.5 fL (ref 80.0–100.0)
Monocytes Absolute: 0.4 10*3/uL (ref 0.1–1.0)
Monocytes Relative: 10 %
Neutro Abs: 2.9 10*3/uL (ref 1.7–7.7)
Neutrophils Relative %: 68 %
Platelets: 217 10*3/uL (ref 150–400)
RBC: 3.77 MIL/uL — ABNORMAL LOW (ref 3.87–5.11)
RDW: 13 % (ref 11.5–15.5)
WBC: 4.3 10*3/uL (ref 4.0–10.5)
nRBC: 0 % (ref 0.0–0.2)

## 2019-12-19 LAB — COMPREHENSIVE METABOLIC PANEL
ALT: 16 U/L (ref 0–44)
AST: 23 U/L (ref 15–41)
Albumin: 4.2 g/dL (ref 3.5–5.0)
Alkaline Phosphatase: 67 U/L (ref 38–126)
Anion gap: 7 (ref 5–15)
BUN: 12 mg/dL (ref 8–23)
CO2: 28 mmol/L (ref 22–32)
Calcium: 9 mg/dL (ref 8.9–10.3)
Chloride: 104 mmol/L (ref 98–111)
Creatinine, Ser: 0.41 mg/dL — ABNORMAL LOW (ref 0.44–1.00)
GFR calc non Af Amer: >60 mL/min (ref >60)
Glucose, Bld: 99 mg/dL (ref 70–99)
Potassium: 4 mmol/L (ref 3.5–5.1)
Sodium: 139 mmol/L (ref 135–145)
Total Bilirubin: 0.6 mg/dL (ref 0.3–1.2)
Total Protein: 6.6 g/dL (ref 6.5–8.1)

## 2019-12-19 MED ORDER — SODIUM CHLORIDE 0.9 % IV SOLN
10.0000 mg/kg | Freq: Once | INTRAVENOUS | Status: AC
  Administered 2019-12-19: 740 mg via INTRAVENOUS
  Filled 2019-12-19: qty 10

## 2019-12-19 MED ORDER — SODIUM CHLORIDE 0.9 % IV SOLN
Freq: Once | INTRAVENOUS | Status: AC
  Filled 2019-12-19: qty 250

## 2019-12-19 MED ORDER — HEPARIN SOD (PORK) LOCK FLUSH 100 UNIT/ML IV SOLN
500.0000 [IU] | Freq: Once | INTRAVENOUS | Status: AC
  Administered 2019-12-19: 500 [IU] via INTRAVENOUS
  Filled 2019-12-19: qty 5

## 2019-12-19 MED ORDER — SODIUM CHLORIDE 0.9% FLUSH
10.0000 mL | Freq: Once | INTRAVENOUS | Status: AC
  Administered 2019-12-19: 10 mL via INTRAVENOUS
  Filled 2019-12-19: qty 10

## 2019-12-19 MED ORDER — HEPARIN SOD (PORK) LOCK FLUSH 100 UNIT/ML IV SOLN
500.0000 [IU] | Freq: Once | INTRAVENOUS | Status: DC | PRN
  Filled 2019-12-19: qty 5

## 2019-12-19 MED ORDER — CLOTRIMAZOLE 1 % EX OINT: 1.0000 "application " | TOPICAL_OINTMENT | Freq: Three times a day (TID) | CUTANEOUS | 0 refills | Status: DC

## 2019-12-19 NOTE — Progress Notes (Signed)
Hematology/Oncology Consult note [facility]  Telephone:([phone] [phone]  Patient Care Team: [name], MD as PCP - General (Family Medicine) Telford Nab, RN as Registered Nurse   Name of the patient: Jennifer Hodges  [id number]  13-Jun-1945   Date of visit: 12/19/19  Diagnosis- Adenocarcinoma of the lung stage III T1b N2 M0  Chief complaint/ Reason for visit-on treatment assessment prior to next cycle of maintenance durvalumab  Heme/Onc history: patient is a 74 year old female who was seen by pulmonary and ENT for ongoing symptoms of bronchitis and possible laryngitis and received antibiotics for the same.She underwent CT chest in September 2020 which showed multiple bilateral lung nodules with associated mediastinal adenopathy and possible primary left upper lobe lung mass. This was followed by a PET CT scan which showed a left upper lobe lung mass measuring 1.9 x 1.2 cm with an SUV of 10.7. Conglomerate AP window adenopathy measuring 5.8 x 2.1 cm with an SUV of 14. She was also noted to have hypermetabolic left hilar adenopathy and left supraclavicular 0.7 cm lymph node with an SUV of 4.6. Noted to have a 7 mm right lower lobe lung nodule with faint metabolic activity. No evidence of other distant metastatic disease. This was followed by a CT super D chest without contrast. The right lower lobe lung nodules were somewhat larger as compared to September 2020. 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 Maintenance durvalumab started in January 2021after scan showed partial response.   Interval history-patient reports that her symptoms of dizziness is currently stable.  Dr. Pryor Ochoa plans to repeat her MRI brain in the near future.  ECOG PS- 1 Pain scale- 0   Review of systems- Review of Systems  Constitutional: Negative for chills, fever, malaise/fatigue and weight loss.  HENT: Negative for congestion, ear discharge and nosebleeds.   Eyes: Negative for blurred vision.  Respiratory: Negative for cough, hemoptysis, sputum production, shortness of breath and wheezing.   Cardiovascular: Negative for chest pain, palpitations, orthopnea and claudication.  Gastrointestinal: Negative for abdominal pain, blood in stool, constipation, diarrhea, heartburn, melena, nausea and vomiting.  Genitourinary: Negative for dysuria, flank pain, frequency, hematuria and urgency.  Musculoskeletal: Negative for back pain, joint pain and myalgias.  Skin: Negative for rash.  Neurological: Negative for dizziness, tingling, focal weakness, seizures, weakness and headaches.  Endo/Heme/Allergies: Does not bruise/bleed easily.  Psychiatric/Behavioral: Negative for depression and suicidal ideas. The patient does not have insomnia.        Allergies  Allergen Reactions  . Aspirin Hives and Other (See Comments)    Difficulty breathing  . Celebrex [Celecoxib] Shortness Of Breath  . Morphine And Related Nausea And Vomiting and Swelling  . Adhesive [Tape] Other (See Comments)    Took top layer of skin off when removed.  . Clarithromycin Nausea And Vomiting  . Codeine Nausea And Vomiting  . Darvon [Propoxyphene Hcl] Nausea And Vomiting  . Demerol [Meperidine] Nausea And Vomiting  . Flonase [Fluticasone Propionate] Other (See Comments)    Fungal infection  . Simvastatin Other (See Comments)    "caused ulcers in mouth, and fever"  .  Talwin [Pentazocine] Nausea And Vomiting       Past Medical History:  Diagnosis Date  . Anemia   . Asthma    No Inhalers--Dr. [name] will order as needed  . Bronchiectasis (HCC)    mild  . Chronic headaches     followed by [facility] migraines  . COPD (chronic obstructive pulmonary disease) ([place])   . DDD (degenerative disc disease), lumbar   . Diabetes mellitus without complication [facility])    "doctor says I no longer have diabetes"    . Diverticulosis   . Family history of adverse reaction to anesthesia    PONV  . Gall stones    history of  . GERD (gastroesophageal reflux disease)    EGD 8/09- non bleeding erosive gastritis, documentd esophageal ulcerations.   . Hiatal hernia   . History of pneumonia   . Hypercholesterolemia   . IBS (irritable bowel syndrome)   . Malignant neoplasm of upper lobe of left lung ([place]) 12/27/2018  . Meniere disease   . Murmur   . Osteoarthritis    lumbar disc disease, left hip  . PONV (postoperative nausea and vomiting)    "Only with last hip and I believe it was due to the morphine"   . Sleep apnea    cpap asked to bring mask and tubing  . Vertigo   . Weakness of right side of body      Past Surgical History:  Procedure Laterality Date  . ABDOMINAL HYSTERECTOMY  age 80  . ANTERIOR CERVICAL DECOMP/DISCECTOMY FUSION N/A 02/24/2015   Procedure: CERVICAL FOUR-FIVE, CERVICAL FIVE-SIX, CERVICAL SIX-SEVEN ANTERIOR CERVICAL DECOMPRESSION/DISCECTOMY FUSION ;  Surgeon: [name], MD;  Location: [facility];  Service: Neurosurgery;  Laterality: N/A;  C45 C56 C67 anterior cervical decompression with fusion interbody prosthesis plating and bonegraft  . APPENDECTOMY    . BACK SURGERY     4th lumbar fusion  . BLADDER SURGERY N/A    with vaginal wall repair  . BREAST CYST ASPIRATION Bilateral    neg  . BREAST SURGERY Bilateral    cyst removed and reduction  . CARDIAC CATHETERIZATION  2014  . CARPAL TUNNEL RELEASE Right 02/11/2016   Procedure: CARPAL TUNNEL RELEASE;  Surgeon:  [name], MD;  Location: [facility];  Service: Orthopedics;  Laterality: Right;  . CATARACT EXTRACTION W/ INTRAOCULAR LENS IMPLANT Bilateral 2015  . CHOLECYSTECTOMY    . EUS N/A 05/31/2012   Procedure: UPPER ENDOSCOPIC ULTRASOUND (EUS) LINEAR;  Surgeon: [name], MD;  Location: [facility];  Service: Endoscopy;  Laterality: N/A;  . EXCISIONAL HEMORRHOIDECTOMY    . JOINT REPLACEMENT Bilateral   . KNEE ARTHROSCOPY WITH LATERAL MENISECTOMY Right 07/07/2015   Procedure: KNEE ARTHROSCOPY WITH LATERAL MENISECTOMY, PARTIAL SYNOVECTOMY;  Surgeon: [name], MD;  Location: [facility];  Service: Orthopedics;  Laterality: Right;  . LUMBAR LAMINECTOMY    . PORTA CATH INSERTION N/A 01/07/2019   Procedure: PORTA CATH INSERTION;  Surgeon: [name], MD;  Location: [facility];  Service: Cardiovascular;  Laterality: N/A;  . REDUCTION MAMMAPLASTY  1990  . RIGHT OOPHORECTOMY    . TOTAL HIP ARTHROPLASTY Left 05/01/2014   Dr. [name]  . TOTAL HIP ARTHROPLASTY Right 08/05/2014   Procedure: TOTAL HIP ARTHROPLASTY ANTERIOR APPROACH;  Surgeon: [name], MD;  Location: [facility];  Service: Orthopedics;  Laterality: Right;  . ULNAR NERVE TRANSPOSITION Right 02/11/2016   Procedure: ULNAR NERVE DECOMPRESSION/TRANSPOSITION;  Surgeon: [name], MD;  Location: [facility];  Service: Orthopedics;  Laterality: Right;  Marland Kitchen VIDEO BRONCHOSCOPY WITH ENDOBRONCHIAL ULTRASOUND Left 12/19/2018   Procedure: VIDEO BRONCHOSCOPY WITH ENDOBRONCHIAL ULTRASOUND, LEFT, SLEEP APNEA;  Surgeon: Vida Rigger, MD;  Location: [facility];  Service: Thoracic;  Laterality: Left;    Social History   Socioeconomic History  . Marital status: Married    Spouse name: Not on file  . Number of children: Not on file  . Years of education: Not on file  . Highest education level: Not on file  Occupational History  . Not on file  Tobacco Use  . Smoking status: Former Smoker    Quit date: 06/13/2007    Years since  quitting: 12.5  . Smokeless tobacco: Never Used  Vaping Use  . Vaping Use: Never used  Substance and Sexual Activity  . Alcohol use: No    Alcohol/week: 0.0 standard drinks  . Drug use: No  . Sexual activity: Never  Other Topics Concern  . Not on file  Social History Narrative  . Not on file   Social Determinants of Health   Financial Resource Strain:   . Difficulty of Paying Living Expenses: Not on file  Food Insecurity:   . Worried About Programme researcher, broadcasting/film/video in the Last Year: Not on file  . Ran Out of Food in the Last Year: Not on file  Transportation Needs:   . Lack of Transportation (Medical): Not on file  . Lack of Transportation (Non-Medical): Not on file  Physical Activity:   . Days of Exercise per Week: Not on file  . Minutes of Exercise per Session: Not on file  Stress:   . Feeling of Stress : Not on file  Social Connections:   . Frequency of Communication with Friends and Family: Not on file  . Frequency of Social Gatherings with Friends and Family: Not on file  . Attends Religious Services: Not on file  . Active Member of Clubs or Organizations: Not on file  . Attends Banker Meetings: Not on file  . Marital Status: Not on file  Intimate Partner Violence:   . Fear of Current or Ex-Partner: Not on file  . Emotionally Abused: Not on file  . Physically Abused: Not on file  . Sexually Abused: Not on file    Family History  Problem Relation Age of Onset  . Heart disease Mother        s/p stent  . Hypertension Mother   . Hypercholesterolemia Mother   . Diabetes Father   . Stomach cancer Other        uncle  . Breast cancer Cousin   . Breast cancer Paternal Aunt      Current Outpatient Medications:  .  albuterol (PROVENTIL) (2.5 MG/3ML) 0.083% nebulizer solution, Take 2.5 mg by nebulization every 6 (six) hours as needed for wheezing., Disp: , Rfl:  .  albuterol (VENTOLIN HFA) 108 (90 Base) MCG/ACT inhaler, Inhale 2 puffs into the lungs every 6  (six) hours as needed for wheezing or shortness of breath., Disp: 18 g, Rfl: 0 .  Bioflavonoid Products (ESTER C PO), Take 1 tablet by mouth 3 (three) times daily., Disp: , Rfl:  .  Calcium-Magnesium-Zinc (CAL-MAG-ZINC PO), Take 1 tablet by mouth daily., Disp: , Rfl:  .  cetirizine (ZYRTEC) 5 MG tablet, Take 1 tablet (5 mg total) by mouth daily. (Patient taking differently: Take 5 mg by mouth daily as needed (runny nose.). ), Disp: 30 tablet, Rfl: 0 .  CHLORASEPTIC MAX SORE THROAT  1.5-33 % LIQD, Use as directed 1 spray in the mouth or throat as needed (throat irritation.). , Disp: , Rfl:  .  Cholecalciferol (EQL VITAMIN D3) 25 MCG (1000 UT) capsule, Take 4,000 Units by mouth daily., Disp: , Rfl:  .  Coenzyme Q10 (CO Q 10) 100 MG CAPS, Take 100 mg by mouth daily. , Disp: , Rfl:  .  Cyanocobalamin (VITAMIN B-12) 2500 MCG SUBL, Place 2,500 mcg under the tongue daily., Disp: , Rfl:  .  cyclobenzaprine (FLEXERIL) 10 MG tablet, Take 10 mg by mouth at bedtime. , Disp: , Rfl:  .  DHEA 25 MG CAPS, Take 25 mg by mouth daily., Disp: , Rfl:  .  Ensure (ENSURE), Take 237 mLs by mouth 2 (two) times daily between meals., Disp: , Rfl:  .  ezetimibe (ZETIA) 10 MG tablet, Take 5 mg by mouth in the morning and at bedtime. , Disp: , Rfl:  .  folic acid (FOLVITE) 712 MCG tablet, Take 400 mcg by mouth 2 (two) times daily. , Disp: , Rfl:  .  Garlic 4580 MG CAPS, Take 1,000 mg by mouth daily., Disp: , Rfl:  .  Histamine Dihydrochloride (AUSTRALIAN DREAM ARTHRITIS EX), Apply 1 application topically 4 (four) times daily as needed (pain.)., Disp: , Rfl:  .  HYDROcodone-acetaminophen (NORCO/VICODIN) 5-325 MG tablet, 1-2 tabs po bid prn (Patient taking differently: Take 1 tablet by mouth at bedtime. ), Disp: 10 tablet, Rfl: 0 .  HYDROcodone-homatropine (HYCODAN) 5-1.5 MG/5ML syrup, Take 5 mLs by mouth 3 (three) times daily as needed for cough., Disp: 120 mL, Rfl: 0 .  lidocaine-prilocaine (EMLA) cream, lidocaine-prilocaine 2.5  %-2.5 % topical cream, Disp: , Rfl:  .  meclizine (ANTIVERT) 25 MG tablet, Take 1 tablet (25 mg total) by mouth every 6 (six) hours as needed for dizziness., Disp: 30 tablet, Rfl: 0 .  montelukast (SINGULAIR) 10 MG tablet, Take 10 mg by mouth at bedtime. , Disp: , Rfl:  .  NON FORMULARY, Take 1 capsule by mouth daily. , Disp: , Rfl:  .  NON FORMULARY, Take 8 mg by mouth in the morning, at noon, and at bedtime. Betahistine capsules 1 tid, Disp: , Rfl:  .  ondansetron (ZOFRAN-ODT) 4 MG disintegrating tablet, Take 4 mg by mouth every 8 (eight) hours as needed for nausea/vomiting., Disp: , Rfl:  .  Polyethyl Glycol-Propyl Glycol (SYSTANE) 0.4-0.3 % SOLN, Place 1 drop into both eyes 5 (five) times daily as needed (dry/irritated eyes). , Disp: , Rfl:  .  pyridOXINE (VITAMIN B-6) 100 MG tablet, Take 100 mg by mouth daily., Disp: , Rfl:  .  Selenium 200 MCG CAPS, Take 200 mcg by mouth daily., Disp: , Rfl:  .  sucralfate (CARAFATE) 1 g tablet, 1 tablet 3 (three) times daily as needed. , Disp: , Rfl:  .  Vitamin A 2250 MCG (7500 UT) CAPS, Take 1 capsule by mouth daily. , Disp: , Rfl:  .  vitamin E 400 UNIT capsule, Take 400 Units by mouth daily., Disp: , Rfl:  .  Zoledronic Acid (RECLAST IV), Inject 1 Dose into the vein as directed. ONCE A YEAR, Disp: , Rfl:  .  Clotrimazole 1 % OINT, Apply 1 application topically in the morning, at noon, and at bedtime., Disp: 56.7 g, Rfl: 0 No current facility-administered medications for this visit.  Facility-Administered Medications Ordered in Other Visits:  .  durvalumab (IMFINZI) 740 mg in sodium chloride 0.9 % 100 mL chemo infusion, 10 mg/kg (Treatment Plan Recorded),  Intravenous, Once, Sindy Guadeloupe, MD .  heparin lock flush 100 unit/mL, 500 Units, Intravenous, Once, Sindy Guadeloupe, MD .  heparin lock flush 100 unit/mL, 500 Units, Intracatheter, Once PRN, Sindy Guadeloupe, MD  Physical exam:  Vitals:   12/19/19 0921  BP: 126/61  Pulse: 78  Resp: 20  Temp: 98  F (36.7 C)  SpO2: 97%  Weight: 163 lb 9.6 oz (74.2 kg)   Physical Exam Cardiovascular:     Rate and Rhythm: Normal rate and regular rhythm.     Heart sounds: Normal heart sounds.  Pulmonary:     Effort: Pulmonary effort is normal.     Breath sounds: Normal breath sounds.  Abdominal:     General: Bowel sounds are normal.     Palpations: Abdomen is soft.  Skin:    General: Skin is warm and dry.  Neurological:     Mental Status: She is alert and oriented to person, place, and time.      CMP Latest Ref Rng & Units 12/19/2019  Glucose 70 - 99 mg/dL 99  BUN 8 - 23 mg/dL 12  Creatinine 0.44 - 1.00 mg/dL 0.41(L)  Sodium 135 - 145 mmol/L 139  Potassium 3.5 - 5.1 mmol/L 4.0  Chloride 98 - 111 mmol/L 104  CO2 22 - 32 mmol/L 28  Calcium 8.9 - 10.3 mg/dL 9.0  Total Protein 6.5 - 8.1 g/dL 6.6  Total Bilirubin 0.3 - 1.2 mg/dL 0.6  Alkaline Phos 38 - 126 U/L 67  AST 15 - 41 U/L 23  ALT 0 - 44 U/L 16   CBC Latest Ref Rng & Units 12/19/2019  WBC 4.0 - 10.5 K/uL 4.3  Hemoglobin 12.0 - 15.0 g/dL 11.8(L)  Hematocrit 36 - 46 % 34.5(L)  Platelets 150 - 400 K/uL 217     Assessment and plan- Patient is a 74 y.o. female with stage III adenocarcinoma of the lung.She is here for on treatment assessment prior to next cycle of maintenance durvalumab  Transition to proceed with cycle 18 of maintenance durvalumab today.  She is tolerating immunotherapy well without any significant side effects.  TSH is normal.  I will see her back in 2 weeks for cycle 19.  We will plan to get repeat scans after 20 cycles.  Vertigo: Likely secondary to inner ear etiology.  She is following up with ENT and on medications for the same.   Visit Diagnosis 1. Encounter for antineoplastic immunotherapy   2. Malignant neoplasm of upper lobe of left lung ([place])      Dr. Randa Evens, MD, MPH [facility] at [facility] [phone] 12/19/2019 10:49 AM

## 2020-01-01 ENCOUNTER — Other Ambulatory Visit: Payer: Self-pay

## 2020-01-01 ENCOUNTER — Encounter: Payer: Self-pay | Admitting: Physical Therapy

## 2020-01-01 ENCOUNTER — Ambulatory Visit: Payer: Medicare Other | Attending: Otolaryngology | Admitting: Physical Therapy

## 2020-01-01 DIAGNOSIS — R2681 Unsteadiness on feet: Secondary | ICD-10-CM | POA: Diagnosis present

## 2020-01-01 DIAGNOSIS — M6281 Muscle weakness (generalized): Secondary | ICD-10-CM

## 2020-01-01 DIAGNOSIS — R42 Dizziness and giddiness: Secondary | ICD-10-CM | POA: Diagnosis present

## 2020-01-01 NOTE — Therapy (Signed)
[facility] [address]. [place], [place], [address] Phone: [phone]   Fax:  [phone]  Physical Therapy Treatment  Patient Details  Name: Jennifer Hodges MRN: [id number] Date of Birth: [birth year] Referring Provider (PT): Brett Fairy, [place]   Encounter Date: 01/01/2020   PT End of Session - 01/01/20 1727    Visit Number 5    Number of Visits 9    Date for PT Re-Evaluation 01/24/20    Authorization Type Initial Eval 11/22/19 FOTO    PT Start Time 1438    PT Stop Time 1525    PT Time Calculation (min) 47 min    Equipment Utilized During Treatment Gait belt    Activity Tolerance Patient limited by fatigue    Behavior During Therapy [facility] for tasks assessed/performed           Past Medical History:  Diagnosis Date  . Anemia   . Asthma    No Inhalers--Dr. [name] will order as needed  . Bronchiectasis (HCC)    mild  . Chronic headaches     followed by [facility] migraines  . COPD (chronic obstructive pulmonary disease) ([place])   . DDD (degenerative disc disease), lumbar   . Diabetes mellitus without complication [facility])    "doctor says I no longer have diabetes"    . Diverticulosis   . Family history of adverse reaction to anesthesia    PONV  . Gall stones    history of  . GERD (gastroesophageal reflux disease)    EGD 8/09- non bleeding erosive gastritis, documentd esophageal ulcerations.   . Hiatal hernia   . History of pneumonia   . Hypercholesterolemia   . IBS (irritable bowel syndrome)   . Malignant neoplasm of upper lobe of left lung ([place]) 12/27/2018  . Meniere disease   . Murmur   . Osteoarthritis    lumbar disc disease, left hip  . PONV (postoperative nausea and vomiting)    "Only with last hip and I believe it was due to the morphine"   . Sleep apnea    cpap asked to bring mask and tubing  . Vertigo   . Weakness of right side of body     Past Surgical History:  Procedure Laterality Date  .  ABDOMINAL HYSTERECTOMY  age 27  . ANTERIOR CERVICAL DECOMP/DISCECTOMY FUSION N/A 02/24/2015   Procedure: CERVICAL FOUR-FIVE, CERVICAL FIVE-SIX, CERVICAL SIX-SEVEN ANTERIOR CERVICAL DECOMPRESSION/DISCECTOMY FUSION ;  Surgeon: [name], MD;  Location: [facility];  Service: Neurosurgery;  Laterality: N/A;  C45 C56 C67 anterior cervical decompression with fusion interbody prosthesis plating and bonegraft  . APPENDECTOMY    . BACK SURGERY     4th lumbar fusion  . BLADDER SURGERY N/A    with vaginal wall repair  . BREAST CYST ASPIRATION Bilateral    neg  . BREAST SURGERY Bilateral    cyst removed and reduction  . CARDIAC CATHETERIZATION  2014  . CARPAL TUNNEL RELEASE Right 02/11/2016   Procedure: CARPAL TUNNEL RELEASE;  Surgeon: [name], MD;  Location: [facility];  Service: Orthopedics;  Laterality: Right;  . CATARACT EXTRACTION W/ INTRAOCULAR LENS IMPLANT Bilateral 2015  . CHOLECYSTECTOMY    . EUS N/A 05/31/2012   Procedure: UPPER ENDOSCOPIC ULTRASOUND (EUS) LINEAR;  Surgeon: [name], MD;  Location: [facility];  Service: Endoscopy;  Laterality: N/A;  . EXCISIONAL HEMORRHOIDECTOMY    . JOINT REPLACEMENT Bilateral   . KNEE ARTHROSCOPY WITH LATERAL MENISECTOMY Right 07/07/2015  Procedure: KNEE ARTHROSCOPY WITH LATERAL MENISECTOMY, PARTIAL SYNOVECTOMY;  Surgeon: [name], MD;  Location: [facility];  Service: Orthopedics;  Laterality: Right;  . LUMBAR LAMINECTOMY    . PORTA CATH INSERTION N/A 01/07/2019   Procedure: PORTA CATH INSERTION;  Surgeon: [name], MD;  Location: [facility];  Service: Cardiovascular;  Laterality: N/A;  . REDUCTION MAMMAPLASTY  1990  . RIGHT OOPHORECTOMY    . TOTAL HIP ARTHROPLASTY Left 05/01/2014   Dr. [name]  . TOTAL HIP ARTHROPLASTY Right 08/05/2014   Procedure: TOTAL HIP ARTHROPLASTY ANTERIOR APPROACH;  Surgeon: [name], MD;  Location: [facility];  Service: Orthopedics;  Laterality: Right;  . ULNAR NERVE TRANSPOSITION  Right 02/11/2016   Procedure: ULNAR NERVE DECOMPRESSION/TRANSPOSITION;  Surgeon: [name], MD;  Location: [facility];  Service: Orthopedics;  Laterality: Right;  Marland Kitchen VIDEO BRONCHOSCOPY WITH ENDOBRONCHIAL ULTRASOUND Left 12/19/2018   Procedure: VIDEO BRONCHOSCOPY WITH ENDOBRONCHIAL ULTRASOUND, LEFT, SLEEP APNEA;  Surgeon: [name], MD;  Location: [facility];  Service: Thoracic;  Laterality: Left;    There were no vitals filed for this visit.   Subjective Assessment - 01/01/20 1737    Subjective Patient reports that she has not had any dizziness in the last 4 weeks. Patient reports that she had bilateral knees injected with MonoVisc last week at Dr. Theodore Demark office.  Patient reports she had a ENT follow-up visit at Dr. Darien Ramus office last week.  Patient states that she has had a productive cough for the last 8 days, without fever or other symptoms.  Recommended that patient notify her primary care physician.  Patient reports that she is going to see her oncologist tomorrow and recommended she discuss her symptoms with her oncologist.  Patient reports that she had been doing well with her home exercise program the first few days of last week, but then she states she got the cough and has been feeling " blah" and therefore has not performed the exercises since then.    Pertinent History Patient reports that she has had follow-up appointments at ENT office and states she is being worked up for [place]. Patient states she is following the Meniere's low sodium diet. Patient reports that she was given Betahistine pills for 30 days and if her headaches are not better they are going to do a brain MRI. Patient reports she has a family history of Meniere's disease and migraines. Patient reports she has to be careful when she lies down on the left side, she gets a falling and vertigo sensation which lasts until she falls asleep. When she wakes up, she says she still has the same sensation when she sits up.  Patient reports she had another hearing test and she reports she does have bilateral hearing loss. Patient reports she uses a RW. Patient reports she veers to the left when she walks.  Patient states she does not drive do to her symptoms.  Patient reports the Meclizine is helping with her nausea. Patient reports she has constant headache that never goes away. She describes it as a dull headache. Patient reports that she has had dizzy spells in the past, but this was more than her usual symptoms. Patient reports she began to have dizziness again last year and then patient began to have symptoms of vertigo, nausea and vomiting in February 2021 when she woke up in the middle of the night with a bad headache and she tried to lift her head off the pillow.  Patient states she  was nauseous and could not sleep on her left side at all. Patient reports that she was been seen by Dr. Pryor Ochoa and Dr. 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 Patient received Epley maneuver at ENT office for left BPPV in March. Patient states the treatments helped but it is still there. Patient had VNG study on 06/19/2019 that revealed abnormal smooth pursuits, saccades and all OPK's. 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 Patient reports that Dr. Pryor Ochoa felt that the chemotherapy drugs might have caused the inner ear damage and balance difficulties. Patient describes her dizziness as vertigo, unsteadiness and lightheadedness.    Diagnostic tests MRI brain- normal study for age    Patient Stated Goals decreased dizziness, improved balance, improved walking          Patient reports that she had bilateral knees injected with MonoVisc last week at Dr. Theodore Demark office.  Patient reports she had a ENT follow-up visit at Dr. Darien Ramus office last week.  Patient states  that she has had a productive cough for the last 8 days, without fever or other symptoms.  Recommended that patient notify her primary care physician.  Patient reports that she is going to see her oncologist tomorrow and recommended she discuss her symptoms with her oncologist.  Patient reports that she had been doing well with her home exercise program the first few days of last week, but then she states she got the cough and has been feeling " blah" and therefore has not performed the exercises since then.  Neuromuscular Re-education:  Airex balance beam: On Airex balance beam, performed sideways stance static holds with narrow base of support with body turns with contact-guard assist.  Patient with increased imbalance with this activity but improved with practice. On Airex balance beam, performed sideways stepping with horizontal head turns 5' times multiple reps. On Airex balance beam, performed sideways stepping with vertical head turns 5' times multiple reps. On firm surface and then Airex balance beam, performed tandem walking 5' times multiple reps.  Heel raises: Patient performed 1 set of 15 reps of heel raises with 3-second holds without upper extremity support with supervision with verbal cues to not hold bar for support unless needed. Patient able to clear heels during this exercise.  FUNCTIONAL OUTCOME MEASURES:  Results Comments  DHI  56/100 Moderate perception of handicap; in need of intervention  ABC Scale  55.6% Falls risk; in need of intervention  DGI  deferred  deferred secondary to fatigue  FOTO  49/100  in need of intervention   Note: Patient declined to perform DGI test this date secondary to fatigue; will plan on doing DGI next session.  Patient demonstrates good motivation during session.  Patient has met short-term goal 1/1 of independence with home exercise program.  Patient scored 55.6% on the ABC scale indicating falls risk.  Patient scored 56/100 on the dizziness  handicap inventory indicating moderate perception of dizziness.  Patient scored 49/100 on FOTO measure.  Patient is progressing towards long-term goals.  Patient reports that she has not had any dizziness in the last 4 weeks.  Patient would benefit from continued skilled PT services to further address functional deficits including decreased balance and strength and in order to try to decrease patient's falls risk.     PT Education - 01/01/20 1726    Education Details Discussed  functional outcome measure testing and compared to prior testing, discussed plan of care    Person(s) Educated Patient    Methods Explanation    Comprehension Verbalized understanding            PT Short Term Goals - 01/01/20 1728      PT SHORT TERM GOAL #1   Title Patient will be independent in home exercise program to improve strength/mobility for better functional independence with ADLs and for self-management.    Time 4    Period Weeks    Status Achieved    Target Date 12/20/19             PT Long Term Goals - 01/01/20 1732      PT LONG TERM GOAL #1   Title Patient will reduce falls risk as indicated by Activities Specific Balance Confidence Scale (ABC) >67%.    Baseline scored 13.3% on 07/19/2019; scored 45% on 08/30/19; SCORED 23.8% on 11/22/19; scored 55.6% on 01/01/2020    Time 8    Period Weeks    Status Partially Met      PT LONG TERM GOAL #2   Title Patient will reduce perceived disability to moderate levels as indicated by <70 on Dizziness Handicap Inventory [facility]).    Baseline scored 92/100 on 07/19/19; scored 28/100 on 08/30/19; SCORED 74/100 on 11/22/19; scored 56/100 on 01/01/2020    Time 8    Period Weeks    Status Achieved      PT LONG TERM GOAL #3   Title Patient will have improved functional status as evidenced by an increase of 10 points or more on the FOTO score.    Baseline scored 45/100 on 07/19/19; scored 52/100 on 08/30/19; SCORED 51/100 on 11/22/19; scored 49/100 on 01/01/2020     Time 8    Period Weeks    Status On-going      PT LONG TERM GOAL #4   Title Patient will demonstrate reduced falls risk as evidenced by Dynamic Gait Index (DGI) 18/24 or more for improved safety with ADLs and community mobility.    Baseline Scored 13/24 on 11/22/19;    Status On-going                 Plan - 01/01/20 1754    Clinical Impression Statement Patient demonstrates good motivation during session.  Patient has met short-term goal 1/1 of independence with home exercise program.  Patient scored 55.6% on the ABC scale indicating falls risk.  Patient scored 56/100 on the dizziness handicap inventory indicating moderate perception of dizziness.  Patient scored 49/100 on FOTO measure.  Patient is progressing towards long-term goals.  Patient reports that she has not had any dizziness in the last 4 weeks.  Patient would benefit from continued skilled PT services to further address functional deficits including decreased balance and strength and in order to try to decrease patient's falls risk.    Personal Factors and Comorbidities Past/Current Experience;Comorbidity 3+;Time since onset of injury/illness/exacerbation    Comorbidities intractable migraine, left sided occipital neuralgia, COPD, sleep apnea, lung CA, chronic tension headaches    Examination-Activity Limitations Bed Mobility;Bend;Reach Overhead;Locomotion Level;Stairs;Transfers    Examination-Participation Restrictions Cleaning;Driving;Yard Work    [employer] Evolving/Moderate complexity    Rehab Potential Fair    PT Frequency 1x / week    PT Duration 8 weeks    PT Treatment/Interventions Canalith Repostioning;Gait training;Stair training;Therapeutic activities;Therapeutic exercise;Balance training;Neuromuscular re-education;Vestibular;Patient/family education    PT Next Visit Plan review HEP; CTSIB; strengthening and  balance exercises    PT Home Exercise Plan sit to stand and VOR X 1 in sitting;  MedBridge Access Code [email or web address]    Consulted and Agree with Plan of Care Patient           Patient will benefit from skilled therapeutic intervention in order to improve the following deficits and impairments:  Decreased balance, Difficulty walking, Dizziness, Decreased strength  Visit Diagnosis: Unsteadiness on feet  Muscle weakness (generalized)     Problem List Patient Active Problem List   Diagnosis Date Noted  . Benign breast lumps 01/11/2019  . Gout 01/11/2019  . Hives 01/11/2019  . Lung disease 01/11/2019  . Migraine headache 01/11/2019  . Goals of care, counseling/discussion 12/27/2018  . Malignant neoplasm of upper lobe of left lung ([place]) 12/27/2018  . Night sweats 08/07/2016  . 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 Osteopenia of multiple sites 04/27/2016  . Left carpal tunnel syndrome 02/02/2016  . Carotid stenosis, asymptomatic, bilateral 12/29/2015  . Prediabetes 09/28/2015  . Primary osteoarthritis involving multiple joints 09/28/2015  . Knee pain 05/11/2015  . Chest tightness 03/24/2015  . Stool incontinence 03/24/2015  . Urinary frequency 03/04/2015  . Cervical spondylosis with radiculopathy 02/24/2015  . Pre-syncope 02/19/2015  . URI (upper respiratory infection) 02/15/2015  . Neck pain 12/25/2014  . Pelvic pain in female 11/16/2014  . Primary osteoarthritis of hip 08/05/2014  . Heel pain 06/01/2014  . Diarrhea 06/01/2014  . Health care maintenance 06/01/2014  . Right leg pain 03/30/2014  . Right arm pain 03/30/2014  . Sinusitis 03/30/2014  . Internal nasal lesion 11/12/2013  . Other specified disorders of nose and nasal sinuses 11/12/2013  . Chronic tension-type headache, intractable 10/30/2013  . Intractable migraine without aura and without status migrainosus 10/30/2013  . Occipital neuralgia of left side 10/30/2013  . IBS (irritable bowel syndrome) 09/03/2013  . Rib pain 08/04/2013  . Cervical radiculitis 08/01/2013  . Lumbar  radiculitis 08/01/2013  . Palpitations 07/25/2013  . Benign essential hypertension 05/28/2013  . Neuralgia, neuritis, and radiculitis, unspecified 05/28/2013  . Vitamin D deficiency 05/28/2013  . Elevated AFP 04/28/2013  . Abnormality of alpha-fetoprotein 04/28/2013  . Osteoporosis 11/12/2012  . Incomplete emptying of bladder 07/11/2012  . Prolapse of vaginal vault after hysterectomy 07/11/2012  . Nonspecific (abnormal) findings on radiological and other examination of gastrointestinal tract 05/31/2012  . Bronchiectasis ([place]) 02/05/2012  . Sleep apnea 02/05/2012  . Degenerative disc disease, cervical 02/05/2012  . Hypercholesterolemia 02/05/2012  . GERD (gastroesophageal reflux disease) 02/05/2012  . Chronic headaches 02/05/2012   Lady Deutscher PT, DPT [phone] Lady Deutscher 01/01/2020, 5:55 PM  [facility] [address] [place], [place], [address] Phone: [phone]   Fax:  [phone]  Name: MATILYNN DACEY MRN: [id number] Date of Birth: 1945/04/13

## 2020-01-02 ENCOUNTER — Other Ambulatory Visit: Payer: Self-pay | Admitting: *Deleted

## 2020-01-02 ENCOUNTER — Inpatient Hospital Stay: Payer: Medicare Other

## 2020-01-02 ENCOUNTER — Inpatient Hospital Stay (HOSPITAL_BASED_OUTPATIENT_CLINIC_OR_DEPARTMENT_OTHER): Payer: Medicare Other | Admitting: Oncology

## 2020-01-02 ENCOUNTER — Encounter: Payer: Self-pay | Admitting: Oncology

## 2020-01-02 VITALS — BP 116/64 | HR 87 | Temp 99.2°F | Resp 16 | Ht 62.0 in | Wt 162.4 lb

## 2020-01-02 DIAGNOSIS — C3412 Malignant neoplasm of upper lobe, left bronchus or lung: Secondary | ICD-10-CM

## 2020-01-02 DIAGNOSIS — Z5112 Encounter for antineoplastic immunotherapy: Secondary | ICD-10-CM | POA: Diagnosis not present

## 2020-01-02 DIAGNOSIS — J189 Pneumonia, unspecified organism: Secondary | ICD-10-CM

## 2020-01-02 LAB — CBC WITH DIFFERENTIAL/PLATELET
Abs Immature Granulocytes: 0.03 10*3/uL (ref 0.00–0.07)
Basophils Absolute: 0 10*3/uL (ref 0.0–0.1)
Basophils Relative: 1 %
Eosinophils Absolute: 0.7 10*3/uL — ABNORMAL HIGH (ref 0.0–0.5)
Eosinophils Relative: 13 %
HCT: 33.8 % — ABNORMAL LOW (ref 36.0–46.0)
Hemoglobin: 11.4 g/dL — ABNORMAL LOW (ref 12.0–15.0)
Immature Granulocytes: 1 %
Lymphocytes Relative: 13 %
Lymphs Abs: 0.7 10*3/uL (ref 0.7–4.0)
MCH: 30.7 pg (ref 26.0–34.0)
MCHC: 33.7 g/dL (ref 30.0–36.0)
MCV: 91.1 fL (ref 80.0–100.0)
Monocytes Absolute: 0.5 10*3/uL (ref 0.1–1.0)
Monocytes Relative: 9 %
Neutro Abs: 3.4 10*3/uL (ref 1.7–7.7)
Neutrophils Relative %: 63 %
Platelets: 241 10*3/uL (ref 150–400)
RBC: 3.71 MIL/uL — ABNORMAL LOW (ref 3.87–5.11)
RDW: 12.3 % (ref 11.5–15.5)
WBC: 5.4 10*3/uL (ref 4.0–10.5)
nRBC: 0 % (ref 0.0–0.2)

## 2020-01-02 LAB — COMPREHENSIVE METABOLIC PANEL
ALT: 13 U/L (ref 0–44)
AST: 19 U/L (ref 15–41)
Albumin: 3.8 g/dL (ref 3.5–5.0)
Alkaline Phosphatase: 71 U/L (ref 38–126)
Anion gap: 7 (ref 5–15)
BUN: 9 mg/dL (ref 8–23)
CO2: 30 mmol/L (ref 22–32)
Calcium: 9.1 mg/dL (ref 8.9–10.3)
Chloride: 101 mmol/L (ref 98–111)
Creatinine, Ser: 0.46 mg/dL (ref 0.44–1.00)
GFR, Estimated: >60 mL/min (ref >60)
Glucose, Bld: 95 mg/dL (ref 70–99)
Potassium: 4.3 mmol/L (ref 3.5–5.1)
Sodium: 138 mmol/L (ref 135–145)
Total Bilirubin: 0.4 mg/dL (ref 0.3–1.2)
Total Protein: 6.6 g/dL (ref 6.5–8.1)

## 2020-01-02 MED ORDER — HEPARIN SOD (PORK) LOCK FLUSH 100 UNIT/ML IV SOLN
INTRAVENOUS | Status: AC
  Filled 2020-01-02: qty 5

## 2020-01-02 MED ORDER — HEPARIN SOD (PORK) LOCK FLUSH 100 UNIT/ML IV SOLN
500.0000 [IU] | Freq: Once | INTRAVENOUS | Status: AC | PRN
  Administered 2020-01-02: 500 [IU]
  Filled 2020-01-02: qty 5

## 2020-01-02 MED ORDER — AMOXICILLIN-POT CLAVULANATE 875-125 MG PO TABS: 1.0000 | ORAL_TABLET | Freq: Two times a day (BID) | ORAL | 0 refills | Status: DC

## 2020-01-02 MED ORDER — SODIUM CHLORIDE 0.9 % IV SOLN
Freq: Once | INTRAVENOUS | Status: AC
  Filled 2020-01-02: qty 250

## 2020-01-02 MED ORDER — SODIUM CHLORIDE 0.9% FLUSH
10.0000 mL | INTRAVENOUS | Status: DC | PRN
  Administered 2020-01-02 (×2): 10 mL
  Filled 2020-01-02: qty 10

## 2020-01-02 MED ORDER — SODIUM CHLORIDE 0.9 % IV SOLN
10.0000 mg/kg | Freq: Once | INTRAVENOUS | Status: AC
  Administered 2020-01-02: 740 mg via INTRAVENOUS
  Filled 2020-01-02: qty 4.8

## 2020-01-02 NOTE — Progress Notes (Signed)
Pt eating and drinking good but coughing up green phlegm since she had dental procedure. She has been taking tylenol because she got injections in her knees so she is not sure if she run a fever.

## 2020-01-06 NOTE — Progress Notes (Signed)
Hematology/Oncology Consult note [facility]  Telephone:([phone] [phone]  Patient Care Team: [name], MD as PCP - General (Family Medicine) Telford Nab, RN as Registered Nurse   Name of the patient: Jennifer Hodges  [id number]  07-06-1945   Date of visit: 01/06/20  Diagnosis- Adenocarcinoma of the lung stage III T1b N2 M0  Chief complaint/ Reason for visit-on treatment assessment prior to next cycle of maintenance durvalumab  Heme/Onc history: patient is a 74 year old female who was seen by pulmonary and ENT for ongoing symptoms of bronchitis and possible laryngitis and received antibiotics for the same.She underwent CT chest in September 2020 which showed multiple bilateral lung nodules with associated mediastinal adenopathy and possible primary left upper lobe lung mass. This was followed by a PET CT scan which showed a left upper lobe lung mass measuring 1.9 x 1.2 cm with an SUV of 10.7. Conglomerate AP window adenopathy measuring 5.8 x 2.1 cm with an SUV of 14. She was also noted to have hypermetabolic left hilar adenopathy and left supraclavicular 0.7 cm lymph node with an SUV of 4.6. Noted to have a 7 mm right lower lobe lung nodule with faint metabolic activity. No evidence of other distant metastatic disease. This was followed by a CT super D chest without contrast. The right lower lobe lung nodules were somewhat larger as compared to September 2020. 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 Maintenance durvalumab started in January 2021after scan showed partial response.  Interval history-patient currently reports some congestive symptoms and cough with greenish sputum.  Denies any fever or shortness of breath.  Also has ongoing symptoms of vertigo for which she sees ENT  ECOG PS- 1 Pain scale- 0   Review of systems- Review of Systems  Constitutional: Positive for malaise/fatigue. Negative for chills, fever and weight loss.  HENT: Negative for congestion, ear discharge and nosebleeds.   Eyes: Negative for blurred vision.  Respiratory: Positive for cough and sputum production. Negative for hemoptysis, shortness of breath and wheezing.   Cardiovascular: Negative for chest pain, palpitations, orthopnea and claudication.  Gastrointestinal: Negative for abdominal pain, blood in stool, constipation, diarrhea, heartburn, melena, nausea and vomiting.  Genitourinary: Negative for dysuria, flank pain, frequency, hematuria and urgency.  Musculoskeletal: Negative for back pain, joint pain and myalgias.  Skin: Negative for rash.  Neurological: Negative for dizziness, tingling, focal weakness, seizures, weakness and headaches.  Endo/Heme/Allergies: Does not bruise/bleed easily.  Psychiatric/Behavioral: Negative for depression and suicidal ideas. The patient does not have insomnia.       Allergies  Allergen Reactions  . Aspirin Hives and Other (See Comments)    Difficulty breathing  . Celebrex [Celecoxib] Shortness Of Breath  . Morphine And Related Nausea And Vomiting and Swelling  . Adhesive [Tape] Other (See Comments)    Took top layer of skin off when removed.  . Clarithromycin Nausea And Vomiting  . Codeine Nausea And Vomiting  . Darvon [Propoxyphene Hcl] Nausea And Vomiting  . Demerol [Meperidine] Nausea And Vomiting  . Flonase [Fluticasone Propionate] Other (See Comments)    Fungal infection  . Simvastatin Other (See Comments)     "  caused ulcers in mouth, and fever"  . Talwin [Pentazocine] Nausea And Vomiting     Past Medical History:  Diagnosis Date  . Anemia   . Asthma    No Inhalers--Dr. [name] will order as needed  . Bronchiectasis (HCC)    mild  . Chronic headaches     followed by [facility] migraines  . COPD (chronic obstructive pulmonary disease) ([place])   . DDD (degenerative disc disease), lumbar   . Diabetes mellitus without complication [facility])    "doctor says I no longer have diabetes"    . Diverticulosis   . Family history of adverse reaction to anesthesia    PONV  . Gall stones    history of  . GERD (gastroesophageal reflux disease)    EGD 8/09- non bleeding erosive gastritis, documentd esophageal ulcerations.   . Hiatal hernia   . History of pneumonia   . Hypercholesterolemia   . IBS (irritable bowel syndrome)   . Malignant neoplasm of upper lobe of left lung ([place]) 12/27/2018  . Meniere disease   . Murmur   . Osteoarthritis    lumbar disc disease, left hip  . PONV (postoperative nausea and vomiting)    "Only with last hip and I believe it was due to the morphine"   . Sleep apnea    cpap asked to bring mask and tubing  . Vertigo   . Weakness of right side of body      Past Surgical History:  Procedure Laterality Date  . ABDOMINAL HYSTERECTOMY  age 3  . ANTERIOR CERVICAL DECOMP/DISCECTOMY FUSION N/A 02/24/2015   Procedure: CERVICAL FOUR-FIVE, CERVICAL FIVE-SIX, CERVICAL SIX-SEVEN ANTERIOR CERVICAL DECOMPRESSION/DISCECTOMY FUSION ;  Surgeon: [name], MD;  Location: [facility];  Service: Neurosurgery;  Laterality: N/A;  C45 C56 C67 anterior cervical decompression with fusion interbody prosthesis plating and bonegraft  . APPENDECTOMY    . BACK SURGERY     4th lumbar fusion  . BLADDER SURGERY N/A    with vaginal wall repair  . BREAST CYST ASPIRATION Bilateral    neg  . BREAST SURGERY Bilateral    cyst removed and reduction  . CARDIAC CATHETERIZATION  2014  .  CARPAL TUNNEL RELEASE Right 02/11/2016   Procedure: CARPAL TUNNEL RELEASE;  Surgeon: [name], MD;  Location: [facility];  Service: Orthopedics;  Laterality: Right;  . CATARACT EXTRACTION W/ INTRAOCULAR LENS IMPLANT Bilateral 2015  . CHOLECYSTECTOMY    . EUS N/A 05/31/2012   Procedure: UPPER ENDOSCOPIC ULTRASOUND (EUS) LINEAR;  Surgeon: [name], MD;  Location: [facility];  Service: Endoscopy;  Laterality: N/A;  . EXCISIONAL HEMORRHOIDECTOMY    . JOINT REPLACEMENT Bilateral   . KNEE ARTHROSCOPY WITH LATERAL MENISECTOMY Right 07/07/2015   Procedure: KNEE ARTHROSCOPY WITH LATERAL MENISECTOMY, PARTIAL SYNOVECTOMY;  Surgeon: [name], MD;  Location: [facility];  Service: Orthopedics;  Laterality: Right;  . LUMBAR LAMINECTOMY    . PORTA CATH INSERTION N/A 01/07/2019   Procedure: PORTA CATH INSERTION;  Surgeon: [name], MD;  Location: [facility];  Service: Cardiovascular;  Laterality: N/A;  . REDUCTION MAMMAPLASTY  1990  . RIGHT OOPHORECTOMY    . TOTAL HIP ARTHROPLASTY Left 05/01/2014   Dr. [name]  . TOTAL HIP ARTHROPLASTY Right 08/05/2014   Procedure: TOTAL HIP ARTHROPLASTY ANTERIOR APPROACH;  Surgeon: [name], MD;  Location: [facility];  Service: Orthopedics;  Laterality: Right;  . ULNAR NERVE TRANSPOSITION Right 02/11/2016   Procedure: ULNAR NERVE DECOMPRESSION/TRANSPOSITION;  Surgeon: [name],  MD;  Location: [facility];  Service: Orthopedics;  Laterality: Right;  Marland Kitchen VIDEO BRONCHOSCOPY WITH ENDOBRONCHIAL ULTRASOUND Left 12/19/2018   Procedure: VIDEO BRONCHOSCOPY WITH ENDOBRONCHIAL ULTRASOUND, LEFT, SLEEP APNEA;  Surgeon: [name], MD;  Location: [facility];  Service: Thoracic;  Laterality: Left;    Social History   Socioeconomic History  . Marital status: Married    Spouse name: Not on file  . Number of children: Not on file  . Years of education: Not on file  . Highest education level: Not on file  Occupational History  . Not on file  Tobacco  Use  . Smoking status: Former Smoker    Quit date: 06/13/2007    Years since quitting: 12.5  . Smokeless tobacco: Never Used  Vaping Use  . Vaping Use: Never used  Substance and Sexual Activity  . Alcohol use: No    Alcohol/week: 0.0 standard drinks  . Drug use: No  . Sexual activity: Never  Other Topics Concern  . Not on file  Social History Narrative  . Not on file   Social Determinants of Health   Financial Resource Strain:   . Difficulty of Paying Living Expenses: Not on file  Food Insecurity:   . Worried About Charity fundraiser in the Last Year: Not on file  . Ran Out of Food in the Last Year: Not on file  Transportation Needs:   . Lack of Transportation (Medical): Not on file  . Lack of Transportation (Non-Medical): Not on file  Physical Activity:   . Days of Exercise per Week: Not on file  . Minutes of Exercise per Session: Not on file  Stress:   . Feeling of Stress : Not on file  Social Connections:   . Frequency of Communication with Friends and Family: Not on file  . Frequency of Social Gatherings with Friends and Family: Not on file  . Attends Religious Services: Not on file  . Active Member of Clubs or Organizations: Not on file  . Attends Archivist Meetings: Not on file  . Marital Status: Not on file  Intimate Partner Violence:   . Fear of Current or Ex-Partner: Not on file  . Emotionally Abused: Not on file  . Physically Abused: Not on file  . Sexually Abused: Not on file    Family History  Problem Relation Age of Onset  . Heart disease Mother        s/p stent  . Hypertension Mother   . Hypercholesterolemia Mother   . Diabetes Father   . Stomach cancer Other        uncle  . Breast cancer Cousin   . Breast cancer Paternal Aunt      Current Outpatient Medications:  .  albuterol (PROVENTIL) (2.5 MG/3ML) 0.083% nebulizer solution, Take 2.5 mg by nebulization every 6 (six) hours as needed for wheezing., Disp: , Rfl:  .  albuterol  (VENTOLIN HFA) 108 (90 Base) MCG/ACT inhaler, Inhale 2 puffs into the lungs every 6 (six) hours as needed for wheezing or shortness of breath., Disp: 18 g, Rfl: 0 .  Bioflavonoid Products (ESTER C PO), Take 1 tablet by mouth 3 (three) times daily., Disp: , Rfl:  .  Calcium-Magnesium-Zinc (CAL-MAG-ZINC PO), Take 1 tablet by mouth daily., Disp: , Rfl:  .  cetirizine (ZYRTEC) 5 MG tablet, Take 1 tablet (5 mg total) by mouth daily. (Patient taking differently: Take 5 mg by mouth daily as needed (runny nose.). ), Disp: 30 tablet, Rfl: 0 .  CHLORASEPTIC MAX SORE THROAT 1.5-33 % LIQD, Use as directed 1 spray in the mouth or throat as needed (throat irritation.). , Disp: , Rfl:  .  Cholecalciferol (EQL VITAMIN D3) 25 MCG (1000 UT) capsule, Take 4,000 Units by mouth daily., Disp: , Rfl:  .  Clotrimazole 1 % OINT, Apply 1 application topically in the morning, at noon, and at bedtime., Disp: 56.7 g, Rfl: 0 .  Coenzyme Q10 (CO Q 10) 100 MG CAPS, Take 100 mg by mouth daily. , Disp: , Rfl:  .  Cyanocobalamin (VITAMIN B-12) 2500 MCG SUBL, Place 2,500 mcg under the tongue daily., Disp: , Rfl:  .  cyclobenzaprine (FLEXERIL) 10 MG tablet, Take 10 mg by mouth at bedtime. , Disp: , Rfl:  .  DHEA 25 MG CAPS, Take 25 mg by mouth daily., Disp: , Rfl:  .  Ensure (ENSURE), Take 237 mLs by mouth 2 (two) times daily between meals., Disp: , Rfl:  .  ezetimibe (ZETIA) 10 MG tablet, Take 5 mg by mouth in the morning and at bedtime. , Disp: , Rfl:  .  folic acid (FOLVITE) 735 MCG tablet, Take 400 mcg by mouth 2 (two) times daily. , Disp: , Rfl:  .  Garlic 3299 MG CAPS, Take 1,000 mg by mouth daily., Disp: , Rfl:  .  Histamine Dihydrochloride (AUSTRALIAN DREAM ARTHRITIS EX), Apply 1 application topically 4 (four) times daily as needed (pain.)., Disp: , Rfl:  .  HYDROcodone-acetaminophen (NORCO/VICODIN) 5-325 MG tablet, 1-2 tabs po bid prn (Patient taking differently: Take 1 tablet by mouth at bedtime. ), Disp: 10 tablet, Rfl: 0 .   HYDROcodone-homatropine (HYCODAN) 5-1.5 MG/5ML syrup, Take 5 mLs by mouth 3 (three) times daily as needed for cough., Disp: 120 mL, Rfl: 0 .  lidocaine-prilocaine (EMLA) cream, lidocaine-prilocaine 2.5 %-2.5 % topical cream, Disp: , Rfl:  .  meclizine (ANTIVERT) 25 MG tablet, Take 1 tablet (25 mg total) by mouth every 6 (six) hours as needed for dizziness., Disp: 30 tablet, Rfl: 0 .  montelukast (SINGULAIR) 10 MG tablet, Take 10 mg by mouth at bedtime. , Disp: , Rfl:  .  NON FORMULARY, Take 1 capsule by mouth daily. , Disp: , Rfl:  .  ondansetron (ZOFRAN-ODT) 4 MG disintegrating tablet, Take 4 mg by mouth every 8 (eight) hours as needed for nausea/vomiting., Disp: , Rfl:  .  Polyethyl Glycol-Propyl Glycol (SYSTANE) 0.4-0.3 % SOLN, Place 1 drop into both eyes 5 (five) times daily as needed (dry/irritated eyes). , Disp: , Rfl:  .  pyridOXINE (VITAMIN B-6) 100 MG tablet, Take 100 mg by mouth daily., Disp: , Rfl:  .  Selenium 200 MCG CAPS, Take 200 mcg by mouth daily., Disp: , Rfl:  .  sucralfate (CARAFATE) 1 g tablet, 1 tablet 3 (three) times daily as needed. , Disp: , Rfl:  .  Vitamin A 2250 MCG (7500 UT) CAPS, Take 1 capsule by mouth daily. , Disp: , Rfl:  .  vitamin E 400 UNIT capsule, Take 400 Units by mouth daily., Disp: , Rfl:  .  Zoledronic Acid (RECLAST IV), Inject 1 Dose into the vein as directed. ONCE A YEAR, Disp: , Rfl:  .  amoxicillin-clavulanate (AUGMENTIN) 875-125 MG tablet, Take 1 tablet by mouth 2 (two) times daily., Disp: 14 tablet, Rfl: 0  Physical exam:  Vitals:   01/02/20 0935  BP: 116/64  Pulse: 87  Resp: 16  Temp: 99.2 F (37.3 C)  TempSrc: Tympanic  SpO2: 97%  Weight: 162 lb 6.4  oz (73.7 kg)  Height: _0  (1.575 m)   Physical Exam Constitutional:      General: She is not in acute distress. Cardiovascular:     Rate and Rhythm: Normal rate and regular rhythm.     Heart sounds: Normal heart sounds.  Pulmonary:     Effort: Pulmonary effort is normal.     Breath  sounds: Normal breath sounds.     Comments: Scattered bilateral rhonchi left greater than right Abdominal:     General: Bowel sounds are normal.     Palpations: Abdomen is soft.  Skin:    General: Skin is warm and dry.  Neurological:     Mental Status: She is alert and oriented to person, place, and time.      CMP Latest Ref Rng & Units 01/02/2020  Glucose 70 - 99 mg/dL 95  BUN 8 - 23 mg/dL 9  Creatinine 0.44 - 1.00 mg/dL 0.46  Sodium 135 - 145 mmol/L 138  Potassium 3.5 - 5.1 mmol/L 4.3  Chloride 98 - 111 mmol/L 101  CO2 22 - 32 mmol/L 30  Calcium 8.9 - 10.3 mg/dL 9.1  Total Protein 6.5 - 8.1 g/dL 6.6  Total Bilirubin 0.3 - 1.2 mg/dL 0.4  Alkaline Phos 38 - 126 U/L 71  AST 15 - 41 U/L 19  ALT 0 - 44 U/L 13   CBC Latest Ref Rng & Units 01/02/2020  WBC 4.0 - 10.5 K/uL 5.4  Hemoglobin 12.0 - 15.0 g/dL 11.4(L)  Hematocrit 36 - 46 % 33.8(L)  Platelets 150 - 400 K/uL 241    Assessment and plan- Patient is a 74 y.o. female with stage III adenocarcinoma of the lung.She is here for on treatment assessment prior to next cycle of maintenance durvalumab  Counts okay to proceed with cycle 20 of maintenance durvalumab today.  I will see her back in 2 weeks for cycle 21.  Repeat CT chest abdomen pelvis with contrast 1 week from now  Patient has symptoms of cough and sputum production.  She gets bouts of pneumonia in the past as well.  I will give her a prescription for Augmentin today.  If her symptoms do not improve she knows to get in touch with Dr. [name]   Visit Diagnosis 1. Malignant neoplasm of upper lobe of left lung ([place])   2. Encounter for antineoplastic immunotherapy   3. Community acquired pneumonia, unspecified laterality      Dr. Randa Evens, MD, MPH [facility] at [facility] [phone] 01/06/2020 9:36 AM

## 2020-01-08 ENCOUNTER — Other Ambulatory Visit: Payer: Self-pay

## 2020-01-08 ENCOUNTER — Encounter: Payer: Self-pay | Admitting: Physical Therapy

## 2020-01-08 ENCOUNTER — Ambulatory Visit: Payer: Medicare Other | Admitting: Physical Therapy

## 2020-01-08 DIAGNOSIS — M6281 Muscle weakness (generalized): Secondary | ICD-10-CM

## 2020-01-08 DIAGNOSIS — R2681 Unsteadiness on feet: Secondary | ICD-10-CM | POA: Diagnosis not present

## 2020-01-08 DIAGNOSIS — R42 Dizziness and giddiness: Secondary | ICD-10-CM

## 2020-01-08 NOTE — Therapy (Signed)
[facility] [address] [place], [place], [address] Phone: [phone]   Fax:  [phone]  Physical Therapy Treatment  Patient Details  Name: Jennifer Hodges MRN: [id number] Date of Birth: [birth year] Referring Provider (PT): Brett Fairy, [place]   Encounter Date: 01/08/2020   PT End of Session - 01/08/20 1546    Visit Number 6    Number of Visits 9    Date for PT Re-Evaluation 01/24/20    Authorization Type Initial Eval 11/22/19 FOTO    PT Start Time 1540    PT Stop Time 1618    PT Time Calculation (min) 38 min    Equipment Utilized During Treatment Gait belt    Activity Tolerance Patient limited by fatigue    Behavior During Therapy [facility] for tasks assessed/performed           Past Medical History:  Diagnosis Date  . Anemia   . Asthma    No Inhalers--Dr. [name] will order as needed  . Bronchiectasis (HCC)    mild  . Chronic headaches     followed by [facility] migraines  . COPD (chronic obstructive pulmonary disease) ([place])   . DDD (degenerative disc disease), lumbar   . Diabetes mellitus without complication [facility])    "doctor says I no longer have diabetes"    . Diverticulosis   . Family history of adverse reaction to anesthesia    PONV  . Gall stones    history of  . GERD (gastroesophageal reflux disease)    EGD 8/09- non bleeding erosive gastritis, documentd esophageal ulcerations.   . Hiatal hernia   . History of pneumonia   . Hypercholesterolemia   . IBS (irritable bowel syndrome)   . Malignant neoplasm of upper lobe of left lung ([place]) 12/27/2018  . Meniere disease   . Murmur   . Osteoarthritis    lumbar disc disease, left hip  . PONV (postoperative nausea and vomiting)    "Only with last hip and I believe it was due to the morphine"   . Sleep apnea    cpap asked to bring mask and tubing  . Vertigo   . Weakness of right side of body     Past Surgical History:  Procedure Laterality Date  .  ABDOMINAL HYSTERECTOMY  age 8  . ANTERIOR CERVICAL DECOMP/DISCECTOMY FUSION N/A 02/24/2015   Procedure: CERVICAL FOUR-FIVE, CERVICAL FIVE-SIX, CERVICAL SIX-SEVEN ANTERIOR CERVICAL DECOMPRESSION/DISCECTOMY FUSION ;  Surgeon: [name], MD;  Location: [facility];  Service: Neurosurgery;  Laterality: N/A;  C45 C56 C67 anterior cervical decompression with fusion interbody prosthesis plating and bonegraft  . APPENDECTOMY    . BACK SURGERY     4th lumbar fusion  . BLADDER SURGERY N/A    with vaginal wall repair  . BREAST CYST ASPIRATION Bilateral    neg  . BREAST SURGERY Bilateral    cyst removed and reduction  . CARDIAC CATHETERIZATION  2014  . CARPAL TUNNEL RELEASE Right 02/11/2016   Procedure: CARPAL TUNNEL RELEASE;  Surgeon: [name], MD;  Location: [facility] ORS;  Service: Orthopedics;  Laterality: Right;  . CATARACT EXTRACTION W/ INTRAOCULAR LENS IMPLANT Bilateral 2015  . CHOLECYSTECTOMY    . EUS N/A 05/31/2012   Procedure: UPPER ENDOSCOPIC ULTRASOUND (EUS) LINEAR;  Surgeon: [name], MD;  Location: [facility];  Service: Endoscopy;  Laterality: N/A;  . EXCISIONAL HEMORRHOIDECTOMY    . JOINT REPLACEMENT Bilateral   . KNEE ARTHROSCOPY WITH LATERAL MENISECTOMY Right 07/07/2015  Procedure: KNEE ARTHROSCOPY WITH LATERAL MENISECTOMY, PARTIAL SYNOVECTOMY;  Surgeon: [name], MD;  Location: [facility] ORS;  Service: Orthopedics;  Laterality: Right;  . LUMBAR LAMINECTOMY    . PORTA CATH INSERTION N/A 01/07/2019   Procedure: PORTA CATH INSERTION;  Surgeon: [name], MD;  Location: [facility];  Service: Cardiovascular;  Laterality: N/A;  . REDUCTION MAMMAPLASTY  1990  . RIGHT OOPHORECTOMY    . TOTAL HIP ARTHROPLASTY Left 05/01/2014   Dr. [name]  . TOTAL HIP ARTHROPLASTY Right 08/05/2014   Procedure: TOTAL HIP ARTHROPLASTY ANTERIOR APPROACH;  Surgeon: [name], MD;  Location: [facility] ORS;  Service: Orthopedics;  Laterality: Right;  . ULNAR NERVE TRANSPOSITION  Right 02/11/2016   Procedure: ULNAR NERVE DECOMPRESSION/TRANSPOSITION;  Surgeon: [name], MD;  Location: [facility] ORS;  Service: Orthopedics;  Laterality: Right;  Marland Kitchen VIDEO BRONCHOSCOPY WITH ENDOBRONCHIAL ULTRASOUND Left 12/19/2018   Procedure: VIDEO BRONCHOSCOPY WITH ENDOBRONCHIAL ULTRASOUND, LEFT, SLEEP APNEA;  Surgeon: [name], MD;  Location: [facility] ORS;  Service: Thoracic;  Laterality: Left;    There were no vitals filed for this visit.   Subjective Assessment - 01/08/20 1544    Subjective Patient states that she saw Dr. [name], pulmonologist, on Monday. Patient states she is taking Delsum DM. Patient states she is feeling better and her congestion is breaking up now.    Pertinent History Patient reports that she has had follow-up appointments at ENT office and states she is being worked up for [place]. Patient states she is following the Meniere's low sodium diet. Patient reports that she was given Betahistine pills for 30 days and if her headaches are not better they are going to do a brain MRI. Patient reports she has a family history of Meniere's disease and migraines. Patient reports she has to be careful when she lies down on the left side, she gets a falling and vertigo sensation which lasts until she falls asleep. When she wakes up, she says she still has the same sensation when she sits up. Patient reports she had another hearing test and she reports she does have bilateral hearing loss. Patient reports she uses a RW. Patient reports she veers to the left when she walks.  Patient states she does not drive do to her symptoms.  Patient reports the Meclizine is helping with her nausea. Patient reports she has constant headache that never goes away. She describes it as a dull headache. Patient reports that she has had dizzy spells in the past, but this was more than her usual symptoms. Patient reports she began to have dizziness again last year and then patient began to have  symptoms of vertigo, nausea and vomiting in February 2021 when she woke up in the middle of the night with a bad headache and she tried to lift her head off the pillow.  Patient states she was nauseous and could not sleep on her left side at all. Patient reports that she was been seen by Dr. Pryor Ochoa and Dr. 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 Patient received Epley maneuver at ENT office for left BPPV in March. Patient states the treatments helped but it is still there. Patient had VNG study on 06/19/2019 that revealed abnormal smooth pursuits, saccades and all OPK's. 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 Patient reports that Dr. Pryor Ochoa felt that the chemotherapy  drugs might have caused the inner ear damage and balance difficulties. Patient describes her dizziness as vertigo, unsteadiness and lightheadedness.    Diagnostic tests MRI brain- normal study for age    Patient Stated Goals decreased dizziness, improved balance, improved walking             [facility] PT Assessment - 01/09/20 1646      Dynamic Gait Index   Level Surface Mild Impairment    Change in Gait Speed Mild Impairment    Gait with Horizontal Head Turns Normal    Gait with Vertical Head Turns Normal    Gait and Pivot Turn Mild Impairment    Step Over Obstacle Moderate Impairment    Step Around Obstacles Normal    Steps Moderate Impairment    Total Score 17          Neuromuscular Re-education:  Nustep: Patient performed Nu-step machine on level 4 for 5 minutes during intake.  Dynamic gait index: Patient performed dynamic gait index with rolling walker and then repeated without rolling walker.  Patient scored 17/24 on DGI without assistive device. Patient required seated rest break after this activity secondary to fatigue.  Heel  raises: Patient performed 20 repetitions of heel raises with 3-second holds at parallel bar without upper extremity support with supervision. Patient able to clear heels better this date.  Patient reports fatigue in calves with this activity.  Obstacle Course: Patient performed obstacle course multiple reps which included the following activities: navigating over 2 different height hurdles, red bolster, 2 height white foam rolls. Patient performed with contact-guard to min assist secondary to patient's foot caught approximately 3 times on the bolsters and hurdles and required assist to correct.  Patient performed multiple reps of forward ambulation over obstacle course and then repeated with sidestepping left and right over obstacle course. Patient required seated rest break secondary to fatigue and shortness of breath during this exercise.  Patient's oxygen sat was 99% and heart rate was 85 bpm.  Demonstrated and educated patient as to pursed lip breathing technique and patient repeated demonstration.   Patient reports that she is feeling better this week and was motivated to session.  Patient improved from 13/24 to 17/24 on the dynamic gait index and was able to perform without assistive device this date.  Patient challenged by stepping obstacle course activities but improved with practice with clearing her feet on a high hurdles and red bolster.  Patient would benefit from continued skilled PT services to further address goals and functional deficits.  Patient would benefit from continued PT services to try to further reduce her falls risk.    PT Education - 01/09/20 1129    Education Details Discussed dynamic gait index testing and compared to prior test results, discussed plan of care, discussed exercise techniques    Person(s) Educated Patient    Methods Explanation;Demonstration;Verbal cues    Comprehension Verbalized understanding;Returned demonstration            PT Short Term Goals -  01/01/20 1728      PT SHORT TERM GOAL #1   Title Patient will be independent in home exercise program to improve strength/mobility for better functional independence with ADLs and for self-management.    Time 4    Period Weeks    Status Achieved    Target Date 12/20/19             PT Long Term Goals - 01/01/20 1732      PT LONG TERM  GOAL #1   Title Patient will reduce falls risk as indicated by Activities Specific Balance Confidence Scale (ABC) >67%.    Baseline scored 13.3% on 07/19/2019; scored 45% on 08/30/19; SCORED 23.8% on 11/22/19; scored 55.6% on 01/01/2020    Time 8    Period Weeks    Status Partially Met      PT LONG TERM GOAL #2   Title Patient will reduce perceived disability to moderate levels as indicated by <70 on Dizziness Handicap Inventory [facility]).    Baseline scored 92/100 on 07/19/19; scored 28/100 on 08/30/19; SCORED 74/100 on 11/22/19; scored 56/100 on 01/01/2020    Time 8    Period Weeks    Status Achieved      PT LONG TERM GOAL #3   Title Patient will have improved functional status as evidenced by an increase of 10 points or more on the FOTO score.    Baseline scored 45/100 on 07/19/19; scored 52/100 on 08/30/19; SCORED 51/100 on 11/22/19; scored 49/100 on 01/01/2020    Time 8    Period Weeks    Status On-going      PT LONG TERM GOAL #4   Title Patient will demonstrate reduced falls risk as evidenced by Dynamic Gait Index (DGI) 18/24 or more for improved safety with ADLs and community mobility.    Baseline Scored 13/24 on 11/22/19;    Status On-going                 Plan - 01/09/20 1655    Clinical Impression Statement Patient reports that she is feeling better this week and was motivated to session.  Patient improved from 13/24 to 17/24 on the dynamic gait index and was able to perform without assistive device this date.  Patient challenged by stepping obstacle course activities but improved with practice with clearing her feet on a high hurdles and red  bolster.  Patient would benefit from continued skilled PT services to further address goals and functional deficits.  Patient would benefit from continued PT services to try to further reduce her falls risk.    Personal Factors and Comorbidities Past/Current Experience;Comorbidity 3+;Time since onset of injury/illness/exacerbation    Comorbidities intractable migraine, left sided occipital neuralgia, COPD, sleep apnea, lung CA, chronic tension headaches    Examination-Activity Limitations Bed Mobility;Bend;Reach Overhead;Locomotion Level;Stairs;Transfers    Examination-Participation Restrictions Cleaning;Driving;Yard Work    [employer] Evolving/Moderate complexity    Rehab Potential Fair    PT Frequency 1x / week    PT Duration 8 weeks    PT Treatment/Interventions Canalith Repostioning;Gait training;Stair training;Therapeutic activities;Therapeutic exercise;Balance training;Neuromuscular re-education;Vestibular;Patient/family education    PT Next Visit Plan review HEP; CTSIB; strengthening and balance exercises    PT Home Exercise Plan sit to stand and VOR X 1 in sitting; MedBridge Access Code [email or web address]    Consulted and Agree with Plan of Care Patient           Patient will benefit from skilled therapeutic intervention in order to improve the following deficits and impairments:  Decreased balance, Difficulty walking, Dizziness, Decreased strength  Visit Diagnosis: Unsteadiness on feet  Muscle weakness (generalized)  Dizziness and giddiness     Problem List Patient Active Problem List   Diagnosis Date Noted  . Benign breast lumps 01/11/2019  . Gout 01/11/2019  . Hives 01/11/2019  . Lung disease 01/11/2019  . Migraine headache 01/11/2019  . Goals of care, counseling/discussion 12/27/2018  . Malignant neoplasm of upper lobe of left lung ([place]) 12/27/2018  .  Night sweats 08/07/2016  . 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 Osteopenia of multiple sites  04/27/2016  . Left carpal tunnel syndrome 02/02/2016  . Carotid stenosis, asymptomatic, bilateral 12/29/2015  . Prediabetes 09/28/2015  . Primary osteoarthritis involving multiple joints 09/28/2015  . Knee pain 05/11/2015  . Chest tightness 03/24/2015  . Stool incontinence 03/24/2015  . Urinary frequency 03/04/2015  . Cervical spondylosis with radiculopathy 02/24/2015  . Pre-syncope 02/19/2015  . URI (upper respiratory infection) 02/15/2015  . Neck pain 12/25/2014  . Pelvic pain in female 11/16/2014  . Primary osteoarthritis of hip 08/05/2014  . Heel pain 06/01/2014  . Diarrhea 06/01/2014  . Health care maintenance 06/01/2014  . Right leg pain 03/30/2014  . Right arm pain 03/30/2014  . Sinusitis 03/30/2014  . Internal nasal lesion 11/12/2013  . Other specified disorders of nose and nasal sinuses 11/12/2013  . Chronic tension-type headache, intractable 10/30/2013  . Intractable migraine without aura and without status migrainosus 10/30/2013  . Occipital neuralgia of left side 10/30/2013  . IBS (irritable bowel syndrome) 09/03/2013  . Rib pain 08/04/2013  . Cervical radiculitis 08/01/2013  . Lumbar radiculitis 08/01/2013  . Palpitations 07/25/2013  . Benign essential hypertension 05/28/2013  . Neuralgia, neuritis, and radiculitis, unspecified 05/28/2013  . Vitamin D deficiency 05/28/2013  . Elevated AFP 04/28/2013  . Abnormality of alpha-fetoprotein 04/28/2013  . Osteoporosis 11/12/2012  . Incomplete emptying of bladder 07/11/2012  . Prolapse of vaginal vault after hysterectomy 07/11/2012  . Nonspecific (abnormal) findings on radiological and other examination of gastrointestinal tract 05/31/2012  . Bronchiectasis ([place]) 02/05/2012  . Sleep apnea 02/05/2012  . Degenerative disc disease, cervical 02/05/2012  . Hypercholesterolemia 02/05/2012  . GERD (gastroesophageal reflux disease) 02/05/2012  . Chronic headaches 02/05/2012   Lady Deutscher PT, DPT [phone] Lady Deutscher 01/09/2020, 4:56 PM  [facility] [address] [place], [place], [address] Phone: [phone]   Fax:  [phone]  Name: Jennifer Hodges MRN: [id number] Date of Birth: June 27, 1945

## 2020-01-10 ENCOUNTER — Other Ambulatory Visit: Payer: Self-pay

## 2020-01-10 ENCOUNTER — Ambulatory Visit
Admission: RE | Admit: 2020-01-10 | Discharge: 2020-01-10 | Disposition: A | Discharge status: Home / Self Care | Payer: Medicare Other | Source: Ambulatory Visit | Attending: Oncology | Admitting: Oncology

## 2020-01-10 DIAGNOSIS — C3412 Malignant neoplasm of upper lobe, left bronchus or lung: Secondary | ICD-10-CM

## 2020-01-10 MED ORDER — IOHEXOL 300 MG/ML  SOLN
100.0000 mL | Freq: Once | INTRAMUSCULAR | Status: AC | PRN
  Administered 2020-01-10: 100 mL via INTRAVENOUS

## 2020-01-15 ENCOUNTER — Other Ambulatory Visit: Payer: Self-pay

## 2020-01-15 ENCOUNTER — Encounter: Payer: Self-pay | Admitting: Physical Therapy

## 2020-01-15 ENCOUNTER — Ambulatory Visit: Payer: Medicare Other | Attending: Otolaryngology | Admitting: Physical Therapy

## 2020-01-15 DIAGNOSIS — M6281 Muscle weakness (generalized): Secondary | ICD-10-CM | POA: Diagnosis present

## 2020-01-15 DIAGNOSIS — R42 Dizziness and giddiness: Secondary | ICD-10-CM | POA: Diagnosis present

## 2020-01-15 DIAGNOSIS — R2681 Unsteadiness on feet: Secondary | ICD-10-CM | POA: Insufficient documentation

## 2020-01-15 NOTE — Therapy (Signed)
[facility] [address]. [place], [place], [address] Phone: [phone]   Fax:  [phone]  Physical Therapy Treatment  Patient Details  Name: Jennifer Hodges MRN: [id number] Date of Birth: 1945-05-15 Referring Provider (PT): Brett Fairy, [place]   Encounter Date: 01/15/2020   PT End of Session - 01/15/20 1645    Visit Number 7    Number of Visits 9    Date for PT Re-Evaluation 01/24/20    Authorization Type Initial Eval 11/22/19 FOTO    PT Start Time 1440    PT Stop Time 1518    PT Time Calculation (min) 38 min    Equipment Utilized During Treatment Gait belt    Activity Tolerance Patient tolerated treatment well    Behavior During Therapy [facility] for tasks assessed/performed           Past Medical History:  Diagnosis Date  . Anemia   . Asthma    No Inhalers--Dr. [name] will order as needed  . Bronchiectasis (HCC)    mild  . Chronic headaches     followed by [facility] migraines  . COPD (chronic obstructive pulmonary disease) ([place])   . DDD (degenerative disc disease), lumbar   . Diabetes mellitus without complication [facility])    "doctor says I no longer have diabetes"    . Diverticulosis   . Family history of adverse reaction to anesthesia    PONV  . Gall stones    history of  . GERD (gastroesophageal reflux disease)    EGD 8/09- non bleeding erosive gastritis, documentd esophageal ulcerations.   . Hiatal hernia   . History of pneumonia   . Hypercholesterolemia   . IBS (irritable bowel syndrome)   . Malignant neoplasm of upper lobe of left lung ([place]) 12/27/2018  . Meniere disease   . Murmur   . Osteoarthritis    lumbar disc disease, left hip  . PONV (postoperative nausea and vomiting)    "Only with last hip and I believe it was due to the morphine"   . Sleep apnea    cpap asked to bring mask and tubing  . Vertigo   . Weakness of right side of body     Past Surgical History:  Procedure Laterality Date  .  ABDOMINAL HYSTERECTOMY  age 54  . ANTERIOR CERVICAL DECOMP/DISCECTOMY FUSION N/A 02/24/2015   Procedure: CERVICAL FOUR-FIVE, CERVICAL FIVE-SIX, CERVICAL SIX-SEVEN ANTERIOR CERVICAL DECOMPRESSION/DISCECTOMY FUSION ;  Surgeon: [name], MD;  Location: [place] [facility];  Service: Neurosurgery;  Laterality: N/A;  C45 C56 C67 anterior cervical decompression with fusion interbody prosthesis plating and bonegraft  . APPENDECTOMY    . BACK SURGERY     4th lumbar fusion  . BLADDER SURGERY N/A    with vaginal wall repair  . BREAST CYST ASPIRATION Bilateral    neg  . BREAST SURGERY Bilateral    cyst removed and reduction  . CARDIAC CATHETERIZATION  2014  . CARPAL TUNNEL RELEASE Right 02/11/2016   Procedure: CARPAL TUNNEL RELEASE;  Surgeon: [name], MD;  Location: [facility] ORS;  Service: Orthopedics;  Laterality: Right;  . CATARACT EXTRACTION W/ INTRAOCULAR LENS IMPLANT Bilateral 2015  . CHOLECYSTECTOMY    . EUS N/A 05/31/2012   Procedure: UPPER ENDOSCOPIC ULTRASOUND (EUS) LINEAR;  Surgeon: [name], MD;  Location: [facility];  Service: Endoscopy;  Laterality: N/A;  . EXCISIONAL HEMORRHOIDECTOMY    . JOINT REPLACEMENT Bilateral   . KNEE ARTHROSCOPY WITH LATERAL MENISECTOMY Right 07/07/2015  Procedure: KNEE ARTHROSCOPY WITH LATERAL MENISECTOMY, PARTIAL SYNOVECTOMY;  Surgeon: [name], MD;  Location: [facility] ORS;  Service: Orthopedics;  Laterality: Right;  . LUMBAR LAMINECTOMY    . PORTA CATH INSERTION N/A 01/07/2019   Procedure: PORTA CATH INSERTION;  Surgeon: [name], MD;  Location: [place] CV LAB;  Service: Cardiovascular;  Laterality: N/A;  . REDUCTION MAMMAPLASTY  1990  . RIGHT OOPHORECTOMY    . TOTAL HIP ARTHROPLASTY Left 05/01/2014   Dr. [name]  . TOTAL HIP ARTHROPLASTY Right 08/05/2014   Procedure: TOTAL HIP ARTHROPLASTY ANTERIOR APPROACH;  Surgeon: [name], MD;  Location: [facility] ORS;  Service: Orthopedics;  Laterality: Right;  . ULNAR NERVE TRANSPOSITION  Right 02/11/2016   Procedure: ULNAR NERVE DECOMPRESSION/TRANSPOSITION;  Surgeon: [name], MD;  Location: [facility] ORS;  Service: Orthopedics;  Laterality: Right;  Marland Kitchen VIDEO BRONCHOSCOPY WITH ENDOBRONCHIAL ULTRASOUND Left 12/19/2018   Procedure: VIDEO BRONCHOSCOPY WITH ENDOBRONCHIAL ULTRASOUND, LEFT, SLEEP APNEA;  Surgeon: [name], MD;  Location: [facility] ORS;  Service: Thoracic;  Laterality: Left;    There were no vitals filed for this visit.   Subjective Assessment - 01/15/20 1446    Subjective Patient states that she is still tired and is still recovering from bronchitis. Patient states she did try to use her stationary bike and do household chores this past week, but was not able to do her home exercise program as much.    Pertinent History Patient reports that she has had follow-up appointments at ENT office and states she is being worked up for [place]. Patient states she is following the Meniere's low sodium diet. Patient reports that she was given Betahistine pills for 30 days and if her headaches are not better they are going to do a brain MRI. Patient reports she has a family history of Meniere's disease and migraines. Patient reports she has to be careful when she lies down on the left side, she gets a falling and vertigo sensation which lasts until she falls asleep. When she wakes up, she says she still has the same sensation when she sits up. Patient reports she had another hearing test and she reports she does have bilateral hearing loss. Patient reports she uses a RW. Patient reports she veers to the left when she walks.  Patient states she does not drive do to her symptoms.  Patient reports the Meclizine is helping with her nausea. Patient reports she has constant headache that never goes away. She describes it as a dull headache. Patient reports that she has had dizzy spells in the past, but this was more than her usual symptoms. Patient reports she began to have dizziness again  last year and then patient began to have symptoms of vertigo, nausea and vomiting in February 2021 when she woke up in the middle of the night with a bad headache and she tried to lift her head off the pillow.  Patient states she was nauseous and could not sleep on her left side at all. Patient reports that she was been seen by Dr. Pryor Ochoa and Dr. 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 Patient received Epley maneuver at ENT office for left BPPV in March. Patient states the treatments helped but it is still there. Patient had VNG study on 06/19/2019 that revealed abnormal smooth pursuits, saccades and all OPK's. VNG results were indicative of potential central pathology as well as bilateral  peripheral vestibular hypofunction. Patient reports that Dr. Pryor Ochoa felt that the chemotherapy drugs might have caused the inner ear damage and balance difficulties. Patient describes her dizziness as vertigo, unsteadiness and lightheadedness.    Diagnostic tests MRI brain- normal study for age    Patient Stated Goals decreased dizziness, improved balance, improved walking          Therapeutic Exercise:  Nustep: Patient performed Nu-step machine on level 4 for 5 minutes during intake.  Standing Hip exercises: Patient performed in // bars holding with B UEs hip flexion, hip ABD and hip extension with # 2 ankle weights 2 sets of 15 reps.  Patient required one short standing rest break due to fatigue.  Patient demonstrated good technique with exercises this date. Patient appeared to fatigue with right lower extremity hip abduction.   Neuromuscular Re-education:  Sit to Stands:  Patient performed 10 reps sit to stand from from chair while holding ball with Airex pad under her feet with contact-guard assist.   Patient able to perform without loss of balance but with mild sway at times but patient was able to  self-correct.    Obstacle Course: Patient performed obstacle course multiple reps which included the following activities: navigating over tall hurdles, red bolster and white foam bolster in parallel bars with contact-guard assist. Patient performed forward stepping over obstacle course multiple reps and then performed sidestepping left and right multiple reps. She did need to adjust her gait and slow down to adjust her steps to be able to step over the tall hurdles and red bolster.  Patient improved with practice and was better able to clear taller obstacles this date as compared to prior session.  Patient demonstrated improved endurance during session this date.  Patient able to progress to 2 pound ankle weights with standing hip exercises.  Patient more consistently able to perform foot clearance with with forward stepping and sidestepping over various obstacles including high hurdles and large foam roll this date.  Will plan on performing high-level balance activities such as half foam roll and Airex balance beam next session.  Patient would benefit from continued skilled PT services to further address functional deficits and goals.    PT Education - 01/15/20 1645    Education Details discussed plan; exercise technique and cuing    Person(s) Educated Patient    Methods Explanation    Comprehension Verbalized understanding            PT Short Term Goals - 01/01/20 1728      PT SHORT TERM GOAL #1   Title Patient will be independent in home exercise program to improve strength/mobility for better functional independence with ADLs and for self-management.    Time 4    Period Weeks    Status Achieved    Target Date 12/20/19             PT Long Term Goals - 01/01/20 1732      PT LONG TERM GOAL #1   Title Patient will reduce falls risk as indicated by Activities Specific Balance Confidence Scale (ABC) >67%.    Baseline scored 13.3% on 07/19/2019; scored 45% on 08/30/19; SCORED 23.8%  on 11/22/19; scored 55.6% on 01/01/2020    Time 8    Period Weeks    Status Partially Met      PT LONG TERM GOAL #2   Title Patient will reduce perceived disability to moderate levels as indicated by <70 on Dizziness Handicap Inventory [facility]).  Baseline scored 92/100 on 07/19/19; scored 28/100 on 08/30/19; SCORED 74/100 on 11/22/19; scored 56/100 on 01/01/2020    Time 8    Period Weeks    Status Achieved      PT LONG TERM GOAL #3   Title Patient will have improved functional status as evidenced by an increase of 10 points or more on the FOTO score.    Baseline scored 45/100 on 07/19/19; scored 52/100 on 08/30/19; SCORED 51/100 on 11/22/19; scored 49/100 on 01/01/2020    Time 8    Period Weeks    Status On-going      PT LONG TERM GOAL #4   Title Patient will demonstrate reduced falls risk as evidenced by Dynamic Gait Index (DGI) 18/24 or more for improved safety with ADLs and community mobility.    Baseline Scored 13/24 on 11/22/19;    Status On-going                 Plan - 01/15/20 1702    Clinical Impression Statement Patient demonstrated improved endurance during session this date.  Patient able to progress to 2 pound ankle weights with standing hip exercises.  Patient more consistently able to perform foot clearance with with forward stepping and sidestepping over various obstacles including high hurdles and large foam roll this date.  Will plan on performing high-level balance activities such as half foam roll and Airex balance beam next session.  Patient would benefit from continued skilled PT services to further address functional deficits and goals.    Personal Factors and Comorbidities Past/Current Experience;Comorbidity 3+;Time since onset of injury/illness/exacerbation    Comorbidities intractable migraine, left sided occipital neuralgia, COPD, sleep apnea, lung CA, chronic tension headaches    Examination-Activity Limitations Bed Mobility;Bend;Reach Overhead;Locomotion  Level;Stairs;Transfers    Examination-Participation Restrictions Cleaning;Driving;Yard Work    [employer] Evolving/Moderate complexity    Rehab Potential Fair    PT Frequency 1x / week    PT Duration 8 weeks    PT Treatment/Interventions Canalith Repostioning;Gait training;Stair training;Therapeutic activities;Therapeutic exercise;Balance training;Neuromuscular re-education;Vestibular;Patient/family education    PT Next Visit Plan review HEP; CTSIB; strengthening and balance exercises    PT Home Exercise Plan sit to stand and VOR X 1 in sitting; MedBridge Access Code [email or web address]    Consulted and Agree with Plan of Care Patient           Patient will benefit from skilled therapeutic intervention in order to improve the following deficits and impairments:  Decreased balance, Difficulty walking, Dizziness, Decreased strength  Visit Diagnosis: Unsteadiness on feet  Muscle weakness (generalized)  Dizziness and giddiness     Problem List Patient Active Problem List   Diagnosis Date Noted  . Benign breast lumps 01/11/2019  . Gout 01/11/2019  . Hives 01/11/2019  . Lung disease 01/11/2019  . Migraine headache 01/11/2019  . Goals of care, counseling/discussion 12/27/2018  . Malignant neoplasm of upper lobe of left lung ([place]) 12/27/2018  . Night sweats 08/07/2016  . 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 Osteopenia of multiple sites 04/27/2016  . Left carpal tunnel syndrome 02/02/2016  . Carotid stenosis, asymptomatic, bilateral 12/29/2015  . Prediabetes 09/28/2015  . Primary osteoarthritis involving multiple joints 09/28/2015  . Knee pain 05/11/2015  . Chest tightness 03/24/2015  . Stool incontinence 03/24/2015  . Urinary frequency 03/04/2015  . Cervical spondylosis with radiculopathy 02/24/2015  . Pre-syncope 02/19/2015  . URI (upper respiratory infection) 02/15/2015  . Neck pain 12/25/2014  . Pelvic pain in female 11/16/2014  . Primary  osteoarthritis of hip 08/05/2014  . Heel pain 06/01/2014  . Diarrhea 06/01/2014  . Health care maintenance 06/01/2014  . Right leg pain 03/30/2014  . Right arm pain 03/30/2014  . Sinusitis 03/30/2014  . Internal nasal lesion 11/12/2013  . Other specified disorders of nose and nasal sinuses 11/12/2013  . Chronic tension-type headache, intractable 10/30/2013  . Intractable migraine without aura and without status migrainosus 10/30/2013  . Occipital neuralgia of left side 10/30/2013  . IBS (irritable bowel syndrome) 09/03/2013  . Rib pain 08/04/2013  . Cervical radiculitis 08/01/2013  . Lumbar radiculitis 08/01/2013  . Palpitations 07/25/2013  . Benign essential hypertension 05/28/2013  . Neuralgia, neuritis, and radiculitis, unspecified 05/28/2013  . Vitamin D deficiency 05/28/2013  . Elevated AFP 04/28/2013  . Abnormality of alpha-fetoprotein 04/28/2013  . Osteoporosis 11/12/2012  . Incomplete emptying of bladder 07/11/2012  . Prolapse of vaginal vault after hysterectomy 07/11/2012  . Nonspecific (abnormal) findings on radiological and other examination of gastrointestinal tract 05/31/2012  . Bronchiectasis ([place]) 02/05/2012  . Sleep apnea 02/05/2012  . Degenerative disc disease, cervical 02/05/2012  . Hypercholesterolemia 02/05/2012  . GERD (gastroesophageal reflux disease) 02/05/2012  . Chronic headaches 02/05/2012   Lady Deutscher PT, DPT [phone] Lady Deutscher 01/15/2020, 5:03 PM   [facility] [address] [place], [place], [address] Phone: [phone]   Fax:  [phone]  Name: LEATA DOMINY MRN: [id number] Date of Birth: 1946/01/26

## 2020-01-16 ENCOUNTER — Inpatient Hospital Stay (HOSPITAL_BASED_OUTPATIENT_CLINIC_OR_DEPARTMENT_OTHER): Payer: Medicare Other | Admitting: Oncology

## 2020-01-16 ENCOUNTER — Inpatient Hospital Stay: Payer: Medicare Other

## 2020-01-16 ENCOUNTER — Encounter: Payer: Self-pay | Admitting: Oncology

## 2020-01-16 ENCOUNTER — Inpatient Hospital Stay: Payer: Medicare Other | Attending: Oncology

## 2020-01-16 VITALS — BP 114/62 | HR 73 | Temp 96.9°F | Resp 16 | Wt 163.2 lb

## 2020-01-16 DIAGNOSIS — C3412 Malignant neoplasm of upper lobe, left bronchus or lung: Secondary | ICD-10-CM | POA: Diagnosis present

## 2020-01-16 DIAGNOSIS — Z5112 Encounter for antineoplastic immunotherapy: Secondary | ICD-10-CM | POA: Insufficient documentation

## 2020-01-16 DIAGNOSIS — R42 Dizziness and giddiness: Secondary | ICD-10-CM | POA: Diagnosis not present

## 2020-01-16 LAB — CBC WITH DIFFERENTIAL/PLATELET
Abs Immature Granulocytes: 0.01 10*3/uL (ref 0.00–0.07)
Basophils Absolute: 0 10*3/uL (ref 0.0–0.1)
Basophils Relative: 1 %
Eosinophils Absolute: 0.7 10*3/uL — ABNORMAL HIGH (ref 0.0–0.5)
Eosinophils Relative: 15 %
HCT: 34.5 % — ABNORMAL LOW (ref 36.0–46.0)
Hemoglobin: 11.4 g/dL — ABNORMAL LOW (ref 12.0–15.0)
Immature Granulocytes: 0 %
Lymphocytes Relative: 13 %
Lymphs Abs: 0.6 10*3/uL — ABNORMAL LOW (ref 0.7–4.0)
MCH: 30.3 pg (ref 26.0–34.0)
MCHC: 33 g/dL (ref 30.0–36.0)
MCV: 91.8 fL (ref 80.0–100.0)
Monocytes Absolute: 0.4 10*3/uL (ref 0.1–1.0)
Monocytes Relative: 9 %
Neutro Abs: 3 10*3/uL (ref 1.7–7.7)
Neutrophils Relative %: 62 %
Platelets: 272 10*3/uL (ref 150–400)
RBC: 3.76 MIL/uL — ABNORMAL LOW (ref 3.87–5.11)
RDW: 12 % (ref 11.5–15.5)
WBC: 4.7 10*3/uL (ref 4.0–10.5)
nRBC: 0 % (ref 0.0–0.2)

## 2020-01-16 LAB — COMPREHENSIVE METABOLIC PANEL
ALT: 11 U/L (ref 0–44)
AST: 18 U/L (ref 15–41)
Albumin: 3.7 g/dL (ref 3.5–5.0)
Alkaline Phosphatase: 70 U/L (ref 38–126)
Anion gap: 8 (ref 5–15)
BUN: 13 mg/dL (ref 8–23)
CO2: 28 mmol/L (ref 22–32)
Calcium: 9.2 mg/dL (ref 8.9–10.3)
Chloride: 102 mmol/L (ref 98–111)
Creatinine, Ser: 0.46 mg/dL (ref 0.44–1.00)
GFR, Estimated: >60 mL/min (ref >60)
Glucose, Bld: 95 mg/dL (ref 70–99)
Potassium: 4.1 mmol/L (ref 3.5–5.1)
Sodium: 138 mmol/L (ref 135–145)
Total Bilirubin: 0.6 mg/dL (ref 0.3–1.2)
Total Protein: 6.5 g/dL (ref 6.5–8.1)

## 2020-01-16 MED ORDER — SODIUM CHLORIDE 0.9 % IV SOLN
Freq: Once | INTRAVENOUS | Status: AC
  Filled 2020-01-16: qty 250

## 2020-01-16 MED ORDER — HEPARIN SOD (PORK) LOCK FLUSH 100 UNIT/ML IV SOLN
500.0000 [IU] | Freq: Once | INTRAVENOUS | Status: AC | PRN
  Administered 2020-01-16: 500 [IU]
  Filled 2020-01-16: qty 5

## 2020-01-16 MED ORDER — SODIUM CHLORIDE 0.9 % IV SOLN
10.0000 mg/kg | Freq: Once | INTRAVENOUS | Status: AC
  Administered 2020-01-16: 740 mg via INTRAVENOUS
  Filled 2020-01-16: qty 10

## 2020-01-16 MED ORDER — SODIUM CHLORIDE 0.9% FLUSH
10.0000 mL | Freq: Once | INTRAVENOUS | Status: AC
  Administered 2020-01-16: 10 mL via INTRAVENOUS
  Filled 2020-01-16: qty 10

## 2020-01-16 NOTE — Progress Notes (Signed)
Pt tolerated infusion well. No s/s of distress or reaction noted. Pt stable at discharge.  

## 2020-01-19 NOTE — Progress Notes (Signed)
Hematology/Oncology Consult note [facility]  Telephone:([phone] [phone]  Patient Care Team: [name], MD as PCP - General (Family Medicine) Telford Nab, RN as Registered Nurse   Name of the patient: Jennifer Hodges  [id number]  [birth year]   Date of visit: 01/19/20  Diagnosis- Adenocarcinoma of the lung stage III T1b N2 M0  Chief complaint/ Reason for visit-on treatment assessment prior to next cycle of maintenance durvalumab and discuss CT scan results  Heme/Onc history: patient is a 74 year old female who was seen by pulmonary and ENT for ongoing symptoms of bronchitis and possible laryngitis and received antibiotics for the same.She underwent CT chest in September 2020 which showed multiple bilateral lung nodules with associated mediastinal adenopathy and possible primary left upper lobe lung mass. This was followed by a PET CT scan which showed a left upper lobe lung mass measuring 1.9 x 1.2 cm with an SUV of 10.7. Conglomerate AP window adenopathy measuring 5.8 x 2.1 cm with an SUV of 14. She was also noted to have hypermetabolic left hilar adenopathy and left supraclavicular 0.7 cm lymph node with an SUV of 4.6. Noted to have a 7 mm right lower lobe lung nodule with faint metabolic activity. No evidence of other distant metastatic disease. This was followed by a CT super D chest without contrast. The right lower lobe lung nodules were somewhat larger as compared to September 2020. 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 Maintenance durvalumab started in January 2021after scan showed partial response.   Interval history-reports that cough is better after receiving Augmentin.  She is also on Hydromet cough syrup through Dr. [name]. ECOG PS- 1 Pain scale- 0   Review of systems- Review of Systems  Constitutional: Positive for malaise/fatigue. Negative for chills, fever and weight loss.  HENT: Negative for congestion, ear discharge and nosebleeds.   Eyes: Negative for blurred vision.  Respiratory: Positive for cough. Negative for hemoptysis, sputum production, shortness of breath and wheezing.   Cardiovascular: Negative for chest pain, palpitations, orthopnea and claudication.  Gastrointestinal: Negative for abdominal pain, blood in stool, constipation, diarrhea, heartburn, melena, nausea and vomiting.  Genitourinary: Negative for dysuria, flank pain, frequency, hematuria and urgency.  Musculoskeletal: Negative for back pain, joint pain and myalgias.  Skin: Negative for rash.  Neurological: Negative for dizziness, tingling, focal weakness, seizures, weakness and headaches.  Endo/Heme/Allergies: Does not bruise/bleed easily.  Psychiatric/Behavioral: Negative for depression and suicidal ideas. The patient does not have insomnia.     Allergies  Allergen Reactions  . Aspirin Hives and Other (See Comments)    Difficulty breathing  . Celebrex [Celecoxib] Shortness Of Breath  . Morphine And Related Nausea And Vomiting and Swelling  . Adhesive [Tape] Other (See Comments)    Took top layer of skin off when removed.  . Clarithromycin Nausea And Vomiting  . Codeine Nausea And Vomiting  . Darvon [Propoxyphene Hcl] Nausea And Vomiting  . Demerol [Meperidine] Nausea And Vomiting  . Flonase [Fluticasone Propionate] Other (See Comments)    Fungal infection  . Simvastatin Other (See Comments)    "caused ulcers in mouth, and fever"  .  Talwin  [Pentazocine] Nausea And Vomiting     Past Medical History:  Diagnosis Date  . Anemia   . Asthma    No Inhalers--Dr. [name] will order as needed  . Bronchiectasis (HCC)    mild  . Chronic headaches     followed by [facility] migraines  . COPD (chronic obstructive pulmonary disease) ([place])   . DDD (degenerative disc disease), lumbar   . Diabetes mellitus without complication [facility])    "doctor says I no longer have diabetes"    . Diverticulosis   . Family history of adverse reaction to anesthesia    PONV  . Gall stones    history of  . GERD (gastroesophageal reflux disease)    EGD 8/09- non bleeding erosive gastritis, documentd esophageal ulcerations.   . Hiatal hernia   . History of pneumonia   . Hypercholesterolemia   . IBS (irritable bowel syndrome)   . Malignant neoplasm of upper lobe of left lung ([place]) 12/27/2018  . Meniere disease   . Murmur   . Osteoarthritis    lumbar disc disease, left hip  . PONV (postoperative nausea and vomiting)    "Only with last hip and I believe it was due to the morphine"   . Sleep apnea    cpap asked to bring mask and tubing  . Vertigo   . Weakness of right side of body      Past Surgical History:  Procedure Laterality Date  . ABDOMINAL HYSTERECTOMY  age 71  . ANTERIOR CERVICAL DECOMP/DISCECTOMY FUSION N/A 02/24/2015   Procedure: CERVICAL FOUR-FIVE, CERVICAL FIVE-SIX, CERVICAL SIX-SEVEN ANTERIOR CERVICAL DECOMPRESSION/DISCECTOMY FUSION ;  Surgeon: [name], MD;  Location: [facility];  Service: Neurosurgery;  Laterality: N/A;  C45 C56 C67 anterior cervical decompression with fusion interbody prosthesis plating and bonegraft  . APPENDECTOMY    . BACK SURGERY     4th lumbar fusion  . BLADDER SURGERY N/A    with vaginal wall repair  . BREAST CYST ASPIRATION Bilateral    neg  . BREAST SURGERY Bilateral    cyst removed and reduction  . CARDIAC CATHETERIZATION  2014  . CARPAL TUNNEL RELEASE Right 02/11/2016   Procedure:  CARPAL TUNNEL RELEASE;  Surgeon: [name], MD;  Location: [facility];  Service: Orthopedics;  Laterality: Right;  . CATARACT EXTRACTION W/ INTRAOCULAR LENS IMPLANT Bilateral 2015  . CHOLECYSTECTOMY    . EUS N/A 05/31/2012   Procedure: UPPER ENDOSCOPIC ULTRASOUND (EUS) LINEAR;  Surgeon: [name], MD;  Location: [facility];  Service: Endoscopy;  Laterality: N/A;  . EXCISIONAL HEMORRHOIDECTOMY    . JOINT REPLACEMENT Bilateral   . KNEE ARTHROSCOPY WITH LATERAL MENISECTOMY Right 07/07/2015   Procedure: KNEE ARTHROSCOPY WITH LATERAL MENISECTOMY, PARTIAL SYNOVECTOMY;  Surgeon: [name], MD;  Location: [facility];  Service: Orthopedics;  Laterality: Right;  . LUMBAR LAMINECTOMY    . PORTA CATH INSERTION N/A 01/07/2019   Procedure: PORTA CATH INSERTION;  Surgeon: [name], MD;  Location: [facility];  Service: Cardiovascular;  Laterality: N/A;  . REDUCTION MAMMAPLASTY  1990  . RIGHT OOPHORECTOMY    . TOTAL HIP ARTHROPLASTY Left 05/01/2014   Dr. [name]  . TOTAL HIP ARTHROPLASTY Right 08/05/2014   Procedure: TOTAL HIP ARTHROPLASTY ANTERIOR APPROACH;  Surgeon: [name], MD;  Location: [facility];  Service: Orthopedics;  Laterality: Right;  . ULNAR NERVE TRANSPOSITION Right 02/11/2016   Procedure: ULNAR NERVE DECOMPRESSION/TRANSPOSITION;  Surgeon: [name], MD;  Location: [facility];  Service:  Orthopedics;  Laterality: Right;  Marland Kitchen VIDEO BRONCHOSCOPY WITH ENDOBRONCHIAL ULTRASOUND Left 12/19/2018   Procedure: VIDEO BRONCHOSCOPY WITH ENDOBRONCHIAL ULTRASOUND, LEFT, SLEEP APNEA;  Surgeon: [name], MD;  Location: [facility];  Service: Thoracic;  Laterality: Left;    Social History   Socioeconomic History  . Marital status: Married    Spouse name: Not on file  . Number of children: Not on file  . Years of education: Not on file  . Highest education level: Not on file  Occupational History  . Not on file  Tobacco Use  . Smoking status: Former Smoker    Quit date:  06/13/2007    Years since quitting: 12.6  . Smokeless tobacco: Never Used  Vaping Use  . Vaping Use: Never used  Substance and Sexual Activity  . Alcohol use: No    Alcohol/week: 0.0 standard drinks  . Drug use: No  . Sexual activity: Never  Other Topics Concern  . Not on file  Social History Narrative  . Not on file   Social Determinants of Health   Financial Resource Strain:   . Difficulty of Paying Living Expenses: Not on file  Food Insecurity:   . Worried About Charity fundraiser in the Last Year: Not on file  . Ran Out of Food in the Last Year: Not on file  Transportation Needs:   . Lack of Transportation (Medical): Not on file  . Lack of Transportation (Non-Medical): Not on file  Physical Activity:   . Days of Exercise per Week: Not on file  . Minutes of Exercise per Session: Not on file  Stress:   . Feeling of Stress : Not on file  Social Connections:   . Frequency of Communication with Friends and Family: Not on file  . Frequency of Social Gatherings with Friends and Family: Not on file  . Attends Religious Services: Not on file  . Active Member of Clubs or Organizations: Not on file  . Attends Archivist Meetings: Not on file  . Marital Status: Not on file  Intimate Partner Violence:   . Fear of Current or Ex-Partner: Not on file  . Emotionally Abused: Not on file  . Physically Abused: Not on file  . Sexually Abused: Not on file    Family History  Problem Relation Age of Onset  . Heart disease Mother        s/p stent  . Hypertension Mother   . Hypercholesterolemia Mother   . Diabetes Father   . Stomach cancer Other        uncle  . Breast cancer Cousin   . Breast cancer Paternal Aunt      Current Outpatient Medications:  .  albuterol (PROVENTIL) (2.5 MG/3ML) 0.083% nebulizer solution, Take 2.5 mg by nebulization every 6 (six) hours as needed for wheezing., Disp: , Rfl:  .  albuterol (VENTOLIN HFA) 108 (90 Base) MCG/ACT inhaler, Inhale 2  puffs into the lungs every 6 (six) hours as needed for wheezing or shortness of breath., Disp: 18 g, Rfl: 0 .  Bioflavonoid Products (ESTER C PO), Take 1 tablet by mouth 3 (three) times daily., Disp: , Rfl:  .  Calcium-Magnesium-Zinc (CAL-MAG-ZINC PO), Take 1 tablet by mouth daily., Disp: , Rfl:  .  cetirizine (ZYRTEC) 5 MG tablet, Take 1 tablet (5 mg total) by mouth daily. (Patient taking differently: Take 5 mg by mouth daily as needed (runny nose.). ), Disp: 30 tablet, Rfl: 0 .  CHLORASEPTIC MAX SORE THROAT 1.5-33 %  LIQD, Use as directed 1 spray in the mouth or throat as needed (throat irritation.). , Disp: , Rfl:  .  Cholecalciferol (EQL VITAMIN D3) 25 MCG (1000 UT) capsule, Take 4,000 Units by mouth daily., Disp: , Rfl:  .  Clotrimazole 1 % OINT, Apply 1 application topically in the morning, at noon, and at bedtime., Disp: 56.7 g, Rfl: 0 .  Coenzyme Q10 (CO Q 10) 100 MG CAPS, Take 100 mg by mouth daily. , Disp: , Rfl:  .  Cyanocobalamin (VITAMIN B-12) 2500 MCG SUBL, Place 2,500 mcg under the tongue daily., Disp: , Rfl:  .  cyclobenzaprine (FLEXERIL) 10 MG tablet, Take 10 mg by mouth at bedtime. , Disp: , Rfl:  .  DHEA 25 MG CAPS, Take 25 mg by mouth daily., Disp: , Rfl:  .  Ensure (ENSURE), Take 237 mLs by mouth 2 (two) times daily between meals., Disp: , Rfl:  .  ezetimibe (ZETIA) 10 MG tablet, Take 5 mg by mouth in the morning and at bedtime. , Disp: , Rfl:  .  folic acid (FOLVITE) 010 MCG tablet, Take 400 mcg by mouth 2 (two) times daily. , Disp: , Rfl:  .  Garlic 2725 MG CAPS, Take 1,000 mg by mouth daily., Disp: , Rfl:  .  Histamine Dihydrochloride (AUSTRALIAN DREAM ARTHRITIS EX), Apply 1 application topically 4 (four) times daily as needed (pain.)., Disp: , Rfl:  .  HYDROcodone-acetaminophen (NORCO/VICODIN) 5-325 MG tablet, 1-2 tabs po bid prn (Patient taking differently: Take 1 tablet by mouth at bedtime. ), Disp: 10 tablet, Rfl: 0 .  HYDROcodone-homatropine (HYCODAN) 5-1.5 MG/5ML syrup,  Take 5 mLs by mouth 3 (three) times daily as needed for cough., Disp: 120 mL, Rfl: 0 .  lidocaine-prilocaine (EMLA) cream, lidocaine-prilocaine 2.5 %-2.5 % topical cream, Disp: , Rfl:  .  meclizine (ANTIVERT) 25 MG tablet, Take 1 tablet (25 mg total) by mouth every 6 (six) hours as needed for dizziness., Disp: 30 tablet, Rfl: 0 .  montelukast (SINGULAIR) 10 MG tablet, Take 10 mg by mouth at bedtime. , Disp: , Rfl:  .  NON FORMULARY, Take 1 capsule by mouth daily. , Disp: , Rfl:  .  ondansetron (ZOFRAN-ODT) 4 MG disintegrating tablet, Take 4 mg by mouth every 8 (eight) hours as needed for nausea/vomiting., Disp: , Rfl:  .  Polyethyl Glycol-Propyl Glycol (SYSTANE) 0.4-0.3 % SOLN, Place 1 drop into both eyes 5 (five) times daily as needed (dry/irritated eyes). , Disp: , Rfl:  .  pyridOXINE (VITAMIN B-6) 100 MG tablet, Take 100 mg by mouth daily., Disp: , Rfl:  .  Selenium 200 MCG CAPS, Take 200 mcg by mouth daily., Disp: , Rfl:  .  sucralfate (CARAFATE) 1 g tablet, 1 tablet 3 (three) times daily as needed. , Disp: , Rfl:  .  Vitamin A 2250 MCG (7500 UT) CAPS, Take 1 capsule by mouth daily. , Disp: , Rfl:  .  vitamin E 400 UNIT capsule, Take 400 Units by mouth daily., Disp: , Rfl:  .  Zoledronic Acid (RECLAST IV), Inject 1 Dose into the vein as directed. ONCE A YEAR, Disp: , Rfl:   Physical exam:  Vitals:   01/16/20 0850  BP: 114/62  Pulse: 73  Resp: 16  Temp: (!) 96.9 F (36.1 C)  TempSrc: Tympanic  SpO2: 97%  Weight: 163 lb 3.2 oz (74 kg)   Physical Exam Constitutional:      General: She is not in acute distress. Eyes:     Pupils:  Pupils are equal, round, and reactive to light.  Cardiovascular:     Rate and Rhythm: Normal rate and regular rhythm.     Heart sounds: Normal heart sounds.  Pulmonary:     Effort: Pulmonary effort is normal.     Breath sounds: Normal breath sounds.  Abdominal:     General: Bowel sounds are normal.     Palpations: Abdomen is soft.  Skin:    General:  Skin is warm and dry.  Neurological:     Mental Status: She is alert and oriented to person, place, and time.      CMP Latest Ref Rng & Units 01/16/2020  Glucose 70 - 99 mg/dL 95  BUN 8 - 23 mg/dL 13  Creatinine 0.44 - 1.00 mg/dL 0.46  Sodium 135 - 145 mmol/L 138  Potassium 3.5 - 5.1 mmol/L 4.1  Chloride 98 - 111 mmol/L 102  CO2 22 - 32 mmol/L 28  Calcium 8.9 - 10.3 mg/dL 9.2  Total Protein 6.5 - 8.1 g/dL 6.5  Total Bilirubin 0.3 - 1.2 mg/dL 0.6  Alkaline Phos 38 - 126 U/L 70  AST 15 - 41 U/L 18  ALT 0 - 44 U/L 11   CBC Latest Ref Rng & Units 01/16/2020  WBC 4.0 - 10.5 K/uL 4.7  Hemoglobin 12.0 - 15.0 g/dL 11.4(L)  Hematocrit 36 - 46 % 34.5(L)  Platelets 150 - 400 K/uL 272    No images are attached to the encounter.  CT CHEST ABDOMEN PELVIS W CONTRAST  Result Date: 01/10/2020 CLINICAL DATA:  73 year old female with history of stage III adenocarcinoma of the lung. Follow-up evaluation to assess for treatment response. Chemo radiation therapy complete 03/12/2019. EXAM: CT CHEST, ABDOMEN, AND PELVIS WITH CONTRAST TECHNIQUE: Multidetector CT imaging of the chest, abdomen and pelvis was performed following the standard protocol during bolus administration of intravenous contrast. CONTRAST:  140m OMNIPAQUE IOHEXOL 300 MG/ML  SOLN COMPARISON:  CT the chest, abdomen and pelvis 09/08/2019. FINDINGS: CT CHEST FINDINGS Cardiovascular: Heart size is normal. There is no significant pericardial fluid, thickening or pericardial calcification. There is aortic atherosclerosis, as well as atherosclerosis of the great vessels of the mediastinum and the coronary arteries, including calcified atherosclerotic plaque in the left main, left anterior descending and right coronary arteries. Right internal jugular single-lumen porta cath with tip terminating in the superior aspect of the right atrium. Mediastinum/Nodes: No pathologically enlarged mediastinal or hilar lymph nodes. Esophagus is unremarkable in  appearance. No axillary lymphadenopathy. Lungs/Pleura: Increasing mass-like architectural distortion in the left lung, favored to predominantly reflect evolving postradiation mass-like fibrosis. However, there are 2 areas that warrant close attention on follow-up studies. Specifically, in the medial aspect of the left upper lobe (axial image 14 of series 2) there is a 3.9 x 1.9 cm mass-like area which is new compared to the prior study. Additionally, in the perihilar aspect of the left upper lobe (axial image 23 of series 2) there is a 2.1 x 1.1 cm nodular area which is also increasingly prominent compared to the prior study. Small cluster of 2-3 mm nodules in the posterior aspect of the right lower lobe (axial image 87 of series 4), nonspecific, but likely reflective of areas of mucoid impaction within terminal bronchioles. Tiny calcified granuloma in the anterior aspect of the right upper lobe. No pleural effusions. Mild diffuse bronchial wall thickening with very mild centrilobular and paraseptal emphysema. Musculoskeletal: Orthopedic fixation hardware in the lower cervical spine incidentally noted. There are no aggressive appearing lytic  or blastic lesions noted in the visualized portions of the skeleton. CT ABDOMEN PELVIS FINDINGS Hepatobiliary: No suspicious cystic or solid hepatic lesions. Status post cholecystectomy. Mild intra and extrahepatic biliary ductal dilatation, stable compared to the prior study, likely to reflect chronic post cholecystectomy physiology. Pancreas: No pancreatic mass. No pancreatic ductal dilatation. No pancreatic or peripancreatic fluid collections or inflammatory changes. Spleen: Unremarkable. Adrenals/Urinary Tract: Bilateral kidneys and adrenal glands are normal in appearance. No hydroureteronephrosis. Urinary bladder is largely obscured by beam hardening artifact from the patient's bilateral hip arthroplasties. Stomach/Bowel: The appearance of the stomach is normal. No  pathologic dilatation of small bowel or colon. Numerous colonic diverticulae are noted, without surrounding inflammatory changes to suggest an acute diverticulitis at this time. The appendix is not confidently identified and may be surgically absent. Regardless, there are no inflammatory changes noted adjacent to the cecum to suggest the presence of an acute appendicitis at this time. Vascular/Lymphatic: Aortic atherosclerosis, without evidence of aneurysm or dissection in the abdominal or pelvic vasculature. No lymphadenopathy noted in the abdomen or pelvis. Reproductive: Status post hysterectomy. Ovaries are not confidently identified may be surgically absent or atrophic, or obscured by beam hardening artifact. Other: No significant volume of ascites.  No pneumoperitoneum. Musculoskeletal: Status post bilateral hip arthroplasty. There are no aggressive appearing lytic or blastic lesions noted in the visualized portions of the skeleton. IMPRESSION: 1. Evolving postradiation changes in the left lung, with 2 areas of presumed postradiation fibrosis which warrant very close attention on follow-up studies. If there is clinical concern for recurrent disease, further evaluation with PET-CT should be considered at this time. In the absence of clinical concern for recurrent disease, close attention to these regions on routine follow-up imaging is highly recommended. 2. No definitive findings to suggest metastatic disease in the abdomen or pelvis. 3. Aortic atherosclerosis, in addition to left main and 2 vessel coronary artery disease. Assessment for potential risk factor modification, dietary therapy or pharmacologic therapy may be warranted, if clinically indicated. 4. Mild diffuse bronchial wall thickening with mild centrilobular and paraseptal emphysema; imaging findings suggestive of underlying COPD. Aortic Atherosclerosis (ICD10-I70.0) and Emphysema (ICD10-J43.9). Electronically Signed   By: Vinnie Langton M.D.    On: 01/10/2020 12:31     Assessment and plan- Patient is a 74 y.o. female with stage III adenocarcinoma of the lung here for on treatment assessment prior to next cycle of maintenance durvalumab  Counts okay to proceed with cycle 21 of maintenance durvalumab today.  I will see her back in 2 weeks for cycle 22.  I have given her a course of Augmentin during her last visit for productive cough which she reports is currently better.  She is also on Hydromet cough syrup through Dr. [name].  I reviewed CT chest abdomen and pelvis images independently and discussed findings with the patient.  Patient continues to have changes of radiation fibrosis involving the left lung.  There were 2 areas in the left upper lobe which were appearing more prominent with areas of masslike consolidation it is unclear if this represents recurrent malignancy versus ongoing radiation changes.  I will be discussing her scan again at tumor board next week.  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 Pryor Ochoa   Visit Diagnosis 1. Malignant neoplasm of upper lobe of left lung ([place])   2. Encounter for antineoplastic immunotherapy  Dr. Randa Evens, MD, MPH [facility] [phone] 01/19/2020 3:20 PM

## 2020-01-22 ENCOUNTER — Other Ambulatory Visit: Payer: Self-pay

## 2020-01-22 ENCOUNTER — Encounter: Payer: Self-pay | Admitting: Physical Therapy

## 2020-01-22 ENCOUNTER — Ambulatory Visit: Payer: Medicare Other | Admitting: Physical Therapy

## 2020-01-22 DIAGNOSIS — R42 Dizziness and giddiness: Secondary | ICD-10-CM

## 2020-01-22 DIAGNOSIS — M6281 Muscle weakness (generalized): Secondary | ICD-10-CM

## 2020-01-22 DIAGNOSIS — R2681 Unsteadiness on feet: Secondary | ICD-10-CM

## 2020-01-22 NOTE — Therapy (Signed)
[facility] [address]. [place], [place], [address] Phone: [phone]   Fax:  [phone]  Physical Therapy Treatment/Discharge Summary Dates of Services: 11/22/2019-01/22/2020 Total Number of Visits: 8  Patient Details  Name: Jennifer Hodges MRN: [id number] Date of Birth: March 29, 1945 Referring Provider (PT): Brett Fairy, [place]   Encounter Date: 01/22/2020   PT End of Session - 01/22/20 1438    Visit Number 8    Number of Visits 9    Date for PT Re-Evaluation 01/24/20    Authorization Type Initial Eval 11/22/19 FOTO    PT Start Time 1430    PT Stop Time 1506    PT Time Calculation (min) 36 min    Equipment Utilized During Treatment Gait belt    Activity Tolerance Patient tolerated treatment well    Behavior During Therapy [facility] for tasks assessed/performed           Past Medical History:  Diagnosis Date  . Anemia   . Asthma    No Inhalers--Dr. [name] will order as needed  . Bronchiectasis (HCC)    mild  . Chronic headaches     followed by [facility] migraines  . COPD (chronic obstructive pulmonary disease) ([place])   . DDD (degenerative disc disease), lumbar   . Diabetes mellitus without complication [facility])    "doctor says I no longer have diabetes"    . Diverticulosis   . Family history of adverse reaction to anesthesia    PONV  . Gall stones    history of  . GERD (gastroesophageal reflux disease)    EGD 8/09- non bleeding erosive gastritis, documentd esophageal ulcerations.   . Hiatal hernia   . History of pneumonia   . Hypercholesterolemia   . IBS (irritable bowel syndrome)   . Malignant neoplasm of upper lobe of left lung ([place]) 12/27/2018  . Meniere disease   . Murmur   . Osteoarthritis    lumbar disc disease, left hip  . PONV (postoperative nausea and vomiting)    "Only with last hip and I believe it was due to the morphine"   . Sleep apnea    cpap asked to bring mask and tubing  . Vertigo   .  Weakness of right side of body     Past Surgical History:  Procedure Laterality Date  . ABDOMINAL HYSTERECTOMY  age 46  . ANTERIOR CERVICAL DECOMP/DISCECTOMY FUSION N/A 02/24/2015   Procedure: CERVICAL FOUR-FIVE, CERVICAL FIVE-SIX, CERVICAL SIX-SEVEN ANTERIOR CERVICAL DECOMPRESSION/DISCECTOMY FUSION ;  Surgeon: [name], MD;  Location: [facility];  Service: Neurosurgery;  Laterality: N/A;  C45 C56 C67 anterior cervical decompression with fusion interbody prosthesis plating and bonegraft  . APPENDECTOMY    . BACK SURGERY     4th lumbar fusion  . BLADDER SURGERY N/A    with vaginal wall repair  . BREAST CYST ASPIRATION Bilateral    neg  . BREAST SURGERY Bilateral    cyst removed and reduction  . CARDIAC CATHETERIZATION  2014  . CARPAL TUNNEL RELEASE Right 02/11/2016   Procedure: CARPAL TUNNEL RELEASE;  Surgeon: [name], MD;  Location: [facility] ORS;  Service: Orthopedics;  Laterality: Right;  . CATARACT EXTRACTION W/ INTRAOCULAR LENS IMPLANT Bilateral 2015  . CHOLECYSTECTOMY    . EUS N/A 05/31/2012   Procedure: UPPER ENDOSCOPIC ULTRASOUND (EUS) LINEAR;  Surgeon: [name], MD;  Location: [facility];  Service: Endoscopy;  Laterality: N/A;  . EXCISIONAL HEMORRHOIDECTOMY    . JOINT REPLACEMENT Bilateral   .  KNEE ARTHROSCOPY WITH LATERAL MENISECTOMY Right 07/07/2015   Procedure: KNEE ARTHROSCOPY WITH LATERAL MENISECTOMY, PARTIAL SYNOVECTOMY;  Surgeon: [name], MD;  Location: [facility] ORS;  Service: Orthopedics;  Laterality: Right;  . LUMBAR LAMINECTOMY    . PORTA CATH INSERTION N/A 01/07/2019   Procedure: PORTA CATH INSERTION;  Surgeon: [name], MD;  Location: [place] [facility];  Service: Cardiovascular;  Laterality: N/A;  . REDUCTION MAMMAPLASTY  1990  . RIGHT OOPHORECTOMY    . TOTAL HIP ARTHROPLASTY Left 05/01/2014   Dr. [name]  . TOTAL HIP ARTHROPLASTY Right 08/05/2014   Procedure: TOTAL HIP ARTHROPLASTY ANTERIOR APPROACH;  Surgeon: [name], MD;   Location: [facility] OR[facility];  Service: Orthopedics;  Laterality: Right;  . ULNAR NERVE TRANSPOSITION Right 02/11/2016   Procedure: ULNAR NERVE DECOMPRESSION/TRANSPOSITION;  Surgeon: [name], MD;  Location: [facility];  Service: Orthopedics;  Laterality: Right;  Marland Kitchen VIDEO BRONCHOSCOPY WITH ENDOBRONCHIAL ULTRASOUND Left 12/19/2018   Procedure: VIDEO BRONCHOSCOPY WITH ENDOBRONCHIAL ULTRASOUND, LEFT, SLEEP APNEA;  Surgeon: [name], MD;  Location: [facility] ORS;  Service: Thoracic;  Laterality: Left;    There were no vitals filed for this visit.   Subjective Assessment - 01/22/20 1434    Subjective Patient states she was up at 2 am due to coughing. Patient states she called her doctor and he prescribed Benzoate and states she just started that. Patient states she has been doing a lot of cleaning at home. Patient states that when she sat down in the shower she was a little nauseated, but reports she has not had any dizziness since last seen. Patient states that she feels therapy has helped her a lot.    Pertinent History Patient reports that she has had follow-up appointments at ENT office and states she is being worked up for [place]. Patient states she is following the Meniere's low sodium diet. Patient reports that she was given Betahistine pills for 30 days and if her headaches are not better they are going to do a brain MRI. Patient reports she has a family history of Meniere's disease and migraines. Patient reports she has to be careful when she lies down on the left side, she gets a falling and vertigo sensation which lasts until she falls asleep. When she wakes up, she says she still has the same sensation when she sits up. Patient reports she had another hearing test and she reports she does have bilateral hearing loss. Patient reports she uses a RW. Patient reports she veers to the left when she walks.  Patient states she does not drive do to her symptoms.  Patient reports the Meclizine is helping  with her nausea. Patient reports she has constant headache that never goes away. She describes it as a dull headache. Patient reports that she has had dizzy spells in the past, but this was more than her usual symptoms. Patient reports she began to have dizziness again last year and then patient began to have symptoms of vertigo, nausea and vomiting in February 2021 when she woke up in the middle of the night with a bad headache and she tried to lift her head off the pillow.  Patient states she was nauseous and could not sleep on her left side at all. Patient reports that she was been seen by Dr. Pryor Ochoa and Dr. 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 Patient received Epley maneuver at ENT  office for left BPPV in March. Patient states the treatments helped but it is still there. Patient had VNG study on 06/19/2019 that revealed abnormal smooth pursuits, saccades and all OPK's. 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 Patient reports that Dr. Pryor Ochoa felt that the chemotherapy drugs might have caused the inner ear damage and balance difficulties. Patient describes her dizziness as vertigo, unsteadiness and lightheadedness.    Diagnostic tests MRI brain- normal study for age    Patient Stated Goals decreased dizziness, improved balance, improved walking              [facility] PT Assessment - 01/22/20 1454      Dynamic Gait Index   Level Surface Normal    Change in Gait Speed Normal    Gait with Horizontal Head Turns Mild Impairment    Gait with Vertical Head Turns Normal    Gait and Pivot Turn Normal    Step Over Obstacle Mild Impairment    Step Around Obstacles Normal    Steps Moderate Impairment    Total Score 20           Physical Performance:  FUNCTIONAL OUTCOME MEASURES:  Results Comments  DHI 22/100 Low  perception of handicap; in need of intervention  ABC Scale 67%  falls risk; in need of intervention  DGI 20/24  falls risk; in need of intervention  FOTO 48/100  in need of intervention   Patient reports that she has a follow-up appointment with Dr. Brigitte Pulse, neurologist, on Monday and that she is going to be starting a new migraine medication.  Patient reports that she takes Tylenol daily to help keep her migraines at [place].  Discussed functional outcome measures and compared to prior testing.  Discussed goals and plan of care and discharge plans. Patient states she feels therapy has helped her a lot. Patient plans to continue performing home exercise program upon discharge.  Repeated functional outcome testing this date and compared to prior testing.  Patient improved from 74/100-22/100 on the dizziness handicap inventory indicating low perception of handicap. Patient scored 48/100 on FOTO indicating difficulties with ADLs and mobility. Patient improved from 23% to 67% on the ABC scale which is significant improvement. Patient improved from 13/24-20/24 on the DGI.  Patient has met 1/1 short-term goals and 3/4 long-term goals as set on plan of care.  Patient did not meet remaining long-term goal of improvement on FOTO score.  Patient in agreement with discharge from PT services at this time.  Patient plans on continuing to perform her home exercise program upon discharge.  Patient is discharged from PT services.       PT Education - 01/22/20 1435    Education Details discussed functional outcome measures, goals and plan of care; discussed discharge plans and home exercise program    Person(s) Educated Patient    Methods Explanation    Comprehension Verbalized understanding            PT Short Term Goals - 01/01/20 1728      PT SHORT TERM GOAL #1   Title Patient will be independent in home exercise program to improve strength/mobility for better functional independence with ADLs and for  self-management.    Time 4    Period Weeks    Status Achieved    Target Date 12/20/19             PT Long Term Goals - 01/22/20 1456  PT LONG TERM GOAL #1   Title Patient will reduce falls risk as indicated by Activities Specific Balance Confidence Scale (ABC) >67%.    Baseline scored 13.3% on 07/19/2019; scored 45% on 08/30/19; SCORED 23.8% on 11/22/19; scored 55.6% on 01/01/2020; 67% on 01/22/2020    Time 8    Period Weeks    Status Achieved      PT LONG TERM GOAL #2   Title Patient will reduce perceived disability to moderate levels as indicated by <70 on Dizziness Handicap Inventory [facility]).    Baseline scored 92/100 on 07/19/19; scored 28/100 on 08/30/19; SCORED 74/100 on 11/22/19; scored 56/100 on 01/01/2020;scored 22/100 on 01/22/2020    Time 8    Period Weeks    Status Achieved      PT LONG TERM GOAL #3   Title Patient will have improved functional status as evidenced by an increase of 10 points or more on the FOTO score.    Baseline scored 45/100 on 07/19/19; scored 52/100 on 08/30/19; SCORED 51/100 on 11/22/19; scored 49/100 on 01/01/2020; FOTO scored 48/100 on 01/22/20    Time 8    Period Weeks    Status Not Met      PT LONG TERM GOAL #4   Title Patient will demonstrate reduced falls risk as evidenced by Dynamic Gait Index (DGI) 18/24 or more for improved safety with ADLs and community mobility.    Baseline Scored 13/24 on 11/22/19; 20/24 on 01/22/2020    Status Achieved                 Plan - 01/23/20 1046    Clinical Impression Statement Repeated functional outcome testing this date and compared to prior testing.  Patient improved from 74/100-22/100 on the dizziness handicap inventory indicating low perception of handicap. Patient scored 48/100 on FOTO indicating difficulties with ADLs and mobility. Patient improved from 23% to 67% on the ABC scale which is significant improvement. Patient improved from 13/24-20/24 on the DGI.  Patient has met 1/1 short-term goals  and 3/4 long-term goals as set on plan of care.  Patient did not meet remaining long-term goal of improvement on FOTO score.  Patient in agreement with discharge from PT services at this time.  Patient plans on continuing to perform her home exercise program upon discharge.  Patient is discharged from PT services.    Personal Factors and Comorbidities Past/Current Experience;Comorbidity 3+;Time since onset of injury/illness/exacerbation    Comorbidities intractable migraine, left sided occipital neuralgia, COPD, sleep apnea, lung CA, chronic tension headaches    Examination-Activity Limitations Bed Mobility;Bend;Reach Overhead;Locomotion Level;Stairs;Transfers    Examination-Participation Restrictions Cleaning;Driving;Yard Work    [employer] Evolving/Moderate complexity    Rehab Potential Fair    PT Frequency 1x / week    PT Duration 8 weeks    PT Treatment/Interventions Canalith Repostioning;Gait training;Stair training;Therapeutic activities;Therapeutic exercise;Balance training;Neuromuscular re-education;Vestibular;Patient/family education    PT Next Visit Plan review HEP; CTSIB; strengthening and balance exercises    PT Home Exercise Plan sit to stand and VOR X 1 in sitting; MedBridge Access Code [email or web address]    Consulted and Agree with Plan of Care Patient           Patient will benefit from skilled therapeutic intervention in order to improve the following deficits and impairments:  Decreased balance, Difficulty walking, Dizziness, Decreased strength  Visit Diagnosis: Unsteadiness on feet  Muscle weakness (generalized)  Dizziness and giddiness     Problem List Patient Active Problem List   Diagnosis  Date Noted  . Benign breast lumps 01/11/2019  . Gout 01/11/2019  . Hives 01/11/2019  . Lung disease 01/11/2019  . Migraine headache 01/11/2019  . Goals of care, counseling/discussion 12/27/2018  . Malignant neoplasm of upper lobe of left lung ([place])  12/27/2018  . Night sweats 08/07/2016  . 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 Osteopenia of multiple sites 04/27/2016  . Left carpal tunnel syndrome 02/02/2016  . Carotid stenosis, asymptomatic, bilateral 12/29/2015  . Prediabetes 09/28/2015  . Primary osteoarthritis involving multiple joints 09/28/2015  . Knee pain 05/11/2015  . Chest tightness 03/24/2015  . Stool incontinence 03/24/2015  . Urinary frequency 03/04/2015  . Cervical spondylosis with radiculopathy 02/24/2015  . Pre-syncope 02/19/2015  . URI (upper respiratory infection) 02/15/2015  . Neck pain 12/25/2014  . Pelvic pain in female 11/16/2014  . Primary osteoarthritis of hip 08/05/2014  . Heel pain 06/01/2014  . Diarrhea 06/01/2014  . Health care maintenance 06/01/2014  . Right leg pain 03/30/2014  . Right arm pain 03/30/2014  . Sinusitis 03/30/2014  . Internal nasal lesion 11/12/2013  . Other specified disorders of nose and nasal sinuses 11/12/2013  . Chronic tension-type headache, intractable 10/30/2013  . Intractable migraine without aura and without status migrainosus 10/30/2013  . Occipital neuralgia of left side 10/30/2013  . IBS (irritable bowel syndrome) 09/03/2013  . Rib pain 08/04/2013  . Cervical radiculitis 08/01/2013  . Lumbar radiculitis 08/01/2013  . Palpitations 07/25/2013  . Benign essential hypertension 05/28/2013  . Neuralgia, neuritis, and radiculitis, unspecified 05/28/2013  . Vitamin D deficiency 05/28/2013  . Elevated AFP 04/28/2013  . Abnormality of alpha-fetoprotein 04/28/2013  . Osteoporosis 11/12/2012  . Incomplete emptying of bladder 07/11/2012  . Prolapse of vaginal vault after hysterectomy 07/11/2012  . Nonspecific (abnormal) findings on radiological and other examination of gastrointestinal tract 05/31/2012  . Bronchiectasis ([place]) 02/05/2012  . Sleep apnea 02/05/2012  . Degenerative disc disease, cervical 02/05/2012  . Hypercholesterolemia 02/05/2012  . GERD  (gastroesophageal reflux disease) 02/05/2012  . Chronic headaches 02/05/2012   Lady Deutscher PT, DPT [phone] Lady Deutscher 01/23/2020, 10:47 AM  [facility] [address] [place], [place], [address] Phone: [phone]   Fax:  [phone]  Name: Jennifer Hodges MRN: [id number] Date of Birth: 1946-02-23

## 2020-01-23 ENCOUNTER — Other Ambulatory Visit: Payer: Medicare Other

## 2020-01-24 NOTE — Progress Notes (Signed)
Tumor Board Documentation  Jennifer Hodges was presented by Dr Janese Banks at our Tumor Board on 01/23/2020, which included representatives from medical oncology, radiation oncology, internal medicine, navigation, pathology, radiology, surgical, pharmacy, genetics, research, palliative care, pulmonology.  Jennifer Hodges currently presents as a current patient, for discussion with history of the following treatments: immunotherapy, active survellience.  Additionally, we reviewed previous medical and familial history, history of present illness, and recent lab results along with all available histopathologic and imaging studies. The tumor board considered available treatment options and made the following recommendations: Active surveillance, Immunotherapy (PET Scan in3 months)    The following procedures/referrals were also placed: No orders of the defined types were placed in this encounter.   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  Tumor board is a meeting of clinicians from various specialty areas who evaluate and discuss patients for whom a multidisciplinary approach is being considered. Final determinations in the plan of care are those of the provider(s). The responsibility for follow up of recommendations given during tumor board is that of the provider.   Today's extended care, comprehensive team conference, Jennifer Hodges was not present for the discussion and was not examined.   Multidisciplinary Tumor Board is a multidisciplinary case peer review process.  Decisions discussed in the Multidisciplinary Tumor Board reflect the opinions of the specialists present at the conference without having examined the patient.  Ultimately, treatment and diagnostic decisions rest with the primary provider(s) and the patient.

## 2020-01-29 ENCOUNTER — Ambulatory Visit: Payer: Medicare Other

## 2020-01-30 ENCOUNTER — Inpatient Hospital Stay: Payer: Medicare Other

## 2020-01-30 ENCOUNTER — Encounter: Payer: Self-pay | Admitting: Oncology

## 2020-01-30 ENCOUNTER — Inpatient Hospital Stay (HOSPITAL_BASED_OUTPATIENT_CLINIC_OR_DEPARTMENT_OTHER): Payer: Medicare Other | Admitting: Oncology

## 2020-01-30 ENCOUNTER — Other Ambulatory Visit: Payer: Self-pay

## 2020-01-30 VITALS — BP 118/62 | HR 70 | Resp 16

## 2020-01-30 VITALS — BP 111/56 | HR 70 | Temp 97.0°F | Resp 16 | Ht 62.0 in | Wt 160.0 lb

## 2020-01-30 DIAGNOSIS — C3412 Malignant neoplasm of upper lobe, left bronchus or lung: Secondary | ICD-10-CM | POA: Diagnosis not present

## 2020-01-30 DIAGNOSIS — Z5112 Encounter for antineoplastic immunotherapy: Secondary | ICD-10-CM

## 2020-01-30 LAB — COMPREHENSIVE METABOLIC PANEL
ALT: 15 U/L (ref 0–44)
AST: 21 U/L (ref 15–41)
Albumin: 4 g/dL (ref 3.5–5.0)
Alkaline Phosphatase: 72 U/L (ref 38–126)
Anion gap: 8 (ref 5–15)
BUN: 12 mg/dL (ref 8–23)
CO2: 27 mmol/L (ref 22–32)
Calcium: 9.1 mg/dL (ref 8.9–10.3)
Chloride: 101 mmol/L (ref 98–111)
Creatinine, Ser: 0.51 mg/dL (ref 0.44–1.00)
GFR, Estimated: >60 mL/min (ref >60)
Glucose, Bld: 101 mg/dL — ABNORMAL HIGH (ref 70–99)
Potassium: 4.1 mmol/L (ref 3.5–5.1)
Sodium: 136 mmol/L (ref 135–145)
Total Bilirubin: 0.6 mg/dL (ref 0.3–1.2)
Total Protein: 6.5 g/dL (ref 6.5–8.1)

## 2020-01-30 LAB — CBC WITH DIFFERENTIAL/PLATELET
Abs Immature Granulocytes: 0.01 10*3/uL (ref 0.00–0.07)
Basophils Absolute: 0 10*3/uL (ref 0.0–0.1)
Basophils Relative: 1 %
Eosinophils Absolute: 0.4 10*3/uL (ref 0.0–0.5)
Eosinophils Relative: 9 %
HCT: 35.7 % — ABNORMAL LOW (ref 36.0–46.0)
Hemoglobin: 12.1 g/dL (ref 12.0–15.0)
Immature Granulocytes: 0 %
Lymphocytes Relative: 18 %
Lymphs Abs: 0.8 10*3/uL (ref 0.7–4.0)
MCH: 30.6 pg (ref 26.0–34.0)
MCHC: 33.9 g/dL (ref 30.0–36.0)
MCV: 90.4 fL (ref 80.0–100.0)
Monocytes Absolute: 0.4 10*3/uL (ref 0.1–1.0)
Monocytes Relative: 10 %
Neutro Abs: 2.6 10*3/uL (ref 1.7–7.7)
Neutrophils Relative %: 62 %
Platelets: 232 10*3/uL (ref 150–400)
RBC: 3.95 MIL/uL (ref 3.87–5.11)
RDW: 12.4 % (ref 11.5–15.5)
WBC: 4.2 10*3/uL (ref 4.0–10.5)
nRBC: 0 % (ref 0.0–0.2)

## 2020-01-30 MED ORDER — SODIUM CHLORIDE 0.9 % IV SOLN
10.0000 mg/kg | Freq: Once | INTRAVENOUS | Status: AC
  Administered 2020-01-30: 740 mg via INTRAVENOUS
  Filled 2020-01-30: qty 10

## 2020-01-30 MED ORDER — SODIUM CHLORIDE 0.9 % IV SOLN
Freq: Once | INTRAVENOUS | Status: AC
  Filled 2020-01-30: qty 250

## 2020-01-30 MED ORDER — HEPARIN SOD (PORK) LOCK FLUSH 100 UNIT/ML IV SOLN
INTRAVENOUS | Status: AC
  Filled 2020-01-30: qty 5

## 2020-01-30 MED ORDER — HEPARIN SOD (PORK) LOCK FLUSH 100 UNIT/ML IV SOLN
500.0000 [IU] | Freq: Once | INTRAVENOUS | Status: AC | PRN
  Administered 2020-01-30: 500 [IU]
  Filled 2020-01-30: qty 5

## 2020-01-30 NOTE — Progress Notes (Signed)
Hematology/Oncology Consult note [facility]  [phone] [phone]  Patient Care Team: [name], MD as PCP - General (Family Medicine) Telford Nab, RN as Registered Nurse   Name of the patient: Jennifer Hodges  [id number]  12-Apr-1945   Date of visit: 01/30/20  Diagnosis- Adenocarcinoma of the lung stage III T1b N2 M0  Chief complaint/ Reason for visit-on treatment assessment prior to next cycle of maintenance durvalumab  Heme/Onc history: patient is a 74 year old female who was seen by pulmonary and ENT for ongoing symptoms of bronchitis and possible laryngitis and received antibiotics for the same.She underwent CT chest in September 2020 which showed multiple bilateral lung nodules with associated mediastinal adenopathy and possible primary left upper lobe lung mass. This was followed by a PET CT scan which showed a left upper lobe lung mass measuring 1.9 x 1.2 cm with an SUV of 10.7. Conglomerate AP window adenopathy measuring 5.8 x 2.1 cm with an SUV of 14. She was also noted to have hypermetabolic left hilar adenopathy and left supraclavicular 0.7 cm lymph node with an SUV of 4.6. Noted to have a 7 mm right lower lobe lung nodule with faint metabolic activity. No evidence of other distant metastatic disease. This was followed by a CT super D chest without contrast. The right lower lobe lung nodules were somewhat larger as compared to September 2020. 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 Maintenance durvalumab started in January 2021after scan showed partial response.   Interval history-patient reports some ongoing cough with greenish expectoration which she has had on and off in the past.  She did get Augmentin about a month ago and is also on Hydromet cough syrup as well as Mucinex given by Dr. [name].  Her temperature has been around 99 but has not had any overt fever.  Denies any shortness of breath.  ECOG PS- 1 Pain scale- 0 Opioid associated constipation- no  Review of systems- Review of Systems  Constitutional: Negative for chills, fever, malaise/fatigue and weight loss.  HENT: Negative for congestion, ear discharge and nosebleeds.   Eyes: Negative for blurred vision.  Respiratory: Positive for cough and sputum production. Negative for hemoptysis, shortness of breath and wheezing.   Cardiovascular: Negative for chest pain, palpitations, orthopnea and claudication.  Gastrointestinal: Negative for abdominal pain, blood in stool, constipation, diarrhea, heartburn, melena, nausea and vomiting.  Genitourinary: Negative for dysuria, flank pain, frequency, hematuria and urgency.  Musculoskeletal: Negative for back pain, joint pain and myalgias.  Skin: Negative for rash.  Neurological: Negative for dizziness, tingling, focal weakness, seizures, weakness and headaches.  Endo/Heme/Allergies: Does not bruise/bleed easily.  Psychiatric/Behavioral: Negative for depression and suicidal ideas. The patient does not have insomnia.        Allergies  Allergen Reactions  . Aspirin Hives and Other (See Comments)    Difficulty breathing  . Celebrex [Celecoxib] Shortness Of Breath  . Morphine And Related Nausea And Vomiting and Swelling  . Adhesive [Tape] Other (See Comments)    Took top layer of skin off when removed.  . Clarithromycin Nausea And Vomiting  . Codeine Nausea And Vomiting  .  Darvon [Propoxyphene Hcl] Nausea And Vomiting    . Demerol [Meperidine] Nausea And Vomiting  . Flonase [Fluticasone Propionate] Other (See Comments)    Fungal infection  . Simvastatin Other (See Comments)    "caused ulcers in mouth, and fever"  . Talwin [Pentazocine] Nausea And Vomiting     Past Medical History:  Diagnosis Date  . Anemia   . Asthma    No Inhalers--Dr. Meredeth Ide will order as needed  . Bronchiectasis (HCC)    mild  . Chronic headaches     followed by [facility] migraines  . COPD (chronic obstructive pulmonary disease) (HCC)   . DDD (degenerative disc disease), lumbar   . Diabetes mellitus without complication [facility])    "doctor says I no longer have diabetes"    . Diverticulosis   . Family history of adverse reaction to anesthesia    PONV  . Gall stones    history of  . GERD (gastroesophageal reflux disease)    EGD 8/09- non bleeding erosive gastritis, documentd esophageal ulcerations.   . Hiatal hernia   . History of pneumonia   . Hypercholesterolemia   . IBS (irritable bowel syndrome)   . 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 Meniere disease   . Murmur   . Osteoarthritis    lumbar disc disease, left hip  . PONV (postoperative nausea and vomiting)    "Only with last hip and I believe it was due to the morphine"   . Sleep apnea    cpap asked to bring mask and tubing  . Vertigo   . Weakness of right side of body      Past Surgical History:  Procedure Laterality Date  . ABDOMINAL HYSTERECTOMY  age 72  . ANTERIOR CERVICAL DECOMP/DISCECTOMY FUSION N/A 02/24/2015   Procedure: CERVICAL FOUR-FIVE, CERVICAL FIVE-SIX, CERVICAL SIX-SEVEN ANTERIOR CERVICAL DECOMPRESSION/DISCECTOMY FUSION ;  Surgeon: Lisbeth Renshaw, MD;  Location: [facility];  Service: Neurosurgery;  Laterality: N/A;  C45 C56 C67 anterior cervical decompression with fusion interbody prosthesis plating and bonegraft  . APPENDECTOMY    . BACK SURGERY     4th lumbar fusion  . BLADDER SURGERY N/A    with  vaginal wall repair  . BREAST CYST ASPIRATION Bilateral    neg  . BREAST SURGERY Bilateral    cyst removed and reduction  . CARDIAC CATHETERIZATION  2014  . CARPAL TUNNEL RELEASE Right 02/11/2016   Procedure: CARPAL TUNNEL RELEASE;  Surgeon: Kennedy Bucker, MD;  Location: [facility] ORS;  Service: Orthopedics;  Laterality: Right;  . CATARACT EXTRACTION W/ INTRAOCULAR LENS IMPLANT Bilateral 2015  . CHOLECYSTECTOMY    . EUS N/A 05/31/2012   Procedure: UPPER ENDOSCOPIC ULTRASOUND (EUS) LINEAR;  Surgeon: Rachael Fee, MD;  Location: [facility] END[facility];  Service: Endoscopy;  Laterality: N/A;  . EXCISIONAL HEMORRHOIDECTOMY    . JOINT REPLACEMENT Bilateral   . KNEE ARTHROSCOPY WITH LATERAL MENISECTOMY Right 07/07/2015   Procedure: KNEE ARTHROSCOPY WITH LATERAL MENISECTOMY, PARTIAL SYNOVECTOMY;  Surgeon: Kennedy Bucker, MD;  Location: [facility] ORS;  Service: Orthopedics;  Laterality: Right;  . LUMBAR LAMINECTOMY    . PORTA CATH INSERTION N/A 01/07/2019   Procedure: PORTA CATH INSERTION;  Surgeon: Annice Needy, MD;  Location: [facility] INVASIVE CV LAB;  Service: Cardiovascular;  Laterality: N/A;  . REDUCTION MAMMAPLASTY  1990  . RIGHT OOPHORECTOMY    . TOTAL HIP ARTHROPLASTY Left 05/01/2014   Dr. Sheppard Evens  . TOTAL HIP ARTHROPLASTY Right 08/05/2014   Procedure:  TOTAL HIP ARTHROPLASTY ANTERIOR APPROACH;  Surgeon: [name], MD;  Location: [facility] ORS;  Service: Orthopedics;  Laterality: Right;  . ULNAR NERVE TRANSPOSITION Right 02/11/2016   Procedure: ULNAR NERVE DECOMPRESSION/TRANSPOSITION;  Surgeon: [name], MD;  Location: [facility] ORS;  Service: Orthopedics;  Laterality: Right;  Marland Kitchen VIDEO BRONCHOSCOPY WITH ENDOBRONCHIAL ULTRASOUND Left 12/19/2018   Procedure: VIDEO BRONCHOSCOPY WITH ENDOBRONCHIAL ULTRASOUND, LEFT, SLEEP APNEA;  Surgeon: [name], MD;  Location: [facility] ORS;  Service: Thoracic;  Laterality: Left;    Social History   Socioeconomic History  . Marital status: Married    Spouse name: Not on file   . Number of children: Not on file  . Years of education: Not on file  . Highest education level: Not on file  Occupational History  . Not on file  Tobacco Use  . Smoking status: Former Smoker    Quit date: 06/13/2007    Years since quitting: 12.6  . Smokeless tobacco: Never Used  Vaping Use  . Vaping Use: Never used  Substance and Sexual Activity  . Alcohol use: No    Alcohol/week: 0.0 standard drinks  . Drug use: No  . Sexual activity: Never  Other Topics Concern  . Not on file  Social History Narrative  . Not on file   Social Determinants of Health   Financial Resource Strain:   . Difficulty of Paying Living Expenses: Not on file  Food Insecurity:   . Worried About Charity fundraiser in the Last Year: Not on file  . Ran Out of Food in the Last Year: Not on file  Transportation Needs:   . Lack of Transportation (Medical): Not on file  . Lack of Transportation (Non-Medical): Not on file  Physical Activity:   . Days of Exercise per Week: Not on file  . Minutes of Exercise per Session: Not on file  Stress:   . Feeling of Stress : Not on file  Social Connections:   . Frequency of Communication with Friends and Family: Not on file  . Frequency of Social Gatherings with Friends and Family: Not on file  . Attends Religious Services: Not on file  . Active Member of Clubs or Organizations: Not on file  . Attends Archivist Meetings: Not on file  . Marital Status: Not on file  Intimate Partner Violence:   . Fear of Current or Ex-Partner: Not on file  . Emotionally Abused: Not on file  . Physically Abused: Not on file  . Sexually Abused: Not on file    Family History  Problem Relation Age of Onset  . Heart disease Mother        s/p stent  . Hypertension Mother   . Hypercholesterolemia Mother   . Diabetes Father   . Stomach cancer Other        uncle  . Breast cancer Cousin   . Breast cancer Paternal Aunt      Current Outpatient Medications:  .   albuterol (PROVENTIL) (2.5 MG/3ML) 0.083% nebulizer solution, Take 2.5 mg by nebulization every 6 (six) hours as needed for wheezing., Disp: , Rfl:  .  albuterol (VENTOLIN HFA) 108 (90 Base) MCG/ACT inhaler, Inhale 2 puffs into the lungs every 6 (six) hours as needed for wheezing or shortness of breath., Disp: 18 g, Rfl: 0 .  Bioflavonoid Products (ESTER C PO), Take 1 tablet by mouth 3 (three) times daily., Disp: , Rfl:  .  Calcium-Magnesium-Zinc (CAL-MAG-ZINC PO), Take 1 tablet by mouth daily., Disp: ,  Rfl:  .  cetirizine (ZYRTEC) 5 MG tablet, Take 1 tablet (5 mg total) by mouth daily. (Patient taking differently: Take 5 mg by mouth daily as needed (runny nose.). ), Disp: 30 tablet, Rfl: 0 .  CHLORASEPTIC MAX SORE THROAT 1.5-33 % LIQD, Use as directed 1 spray in the mouth or throat as needed (throat irritation.). , Disp: , Rfl:  .  Cholecalciferol (EQL VITAMIN D3) 25 MCG (1000 UT) capsule, Take 4,000 Units by mouth daily., Disp: , Rfl:  .  Clotrimazole 1 % OINT, Apply 1 application topically in the morning, at noon, and at bedtime., Disp: 56.7 g, Rfl: 0 .  Coenzyme Q10 (CO Q 10) 100 MG CAPS, Take 100 mg by mouth daily. , Disp: , Rfl:  .  Cyanocobalamin (VITAMIN B-12) 2500 MCG SUBL, Place 2,500 mcg under the tongue daily., Disp: , Rfl:  .  cyclobenzaprine (FLEXERIL) 10 MG tablet, Take 10 mg by mouth at bedtime. , Disp: , Rfl:  .  DHEA 25 MG CAPS, Take 25 mg by mouth daily., Disp: , Rfl:  .  Ensure (ENSURE), Take 237 mLs by mouth 2 (two) times daily between meals., Disp: , Rfl:  .  ezetimibe (ZETIA) 10 MG tablet, Take 5 mg by mouth in the morning and at bedtime. , Disp: , Rfl:  .  folic acid (FOLVITE) 811 MCG tablet, Take 400 mcg by mouth 2 (two) times daily. , Disp: , Rfl:  .  Garlic 9147 MG CAPS, Take 1,000 mg by mouth daily., Disp: , Rfl:  .  Histamine Dihydrochloride (AUSTRALIAN DREAM ARTHRITIS EX), Apply 1 application topically 4 (four) times daily as needed (pain.)., Disp: , Rfl:  .   HYDROcodone-acetaminophen (NORCO/VICODIN) 5-325 MG tablet, 1-2 tabs po bid prn (Patient taking differently: Take 1 tablet by mouth at bedtime. ), Disp: 10 tablet, Rfl: 0 .  HYDROcodone-homatropine (HYCODAN) 5-1.5 MG/5ML syrup, Take 5 mLs by mouth 3 (three) times daily as needed for cough., Disp: 120 mL, Rfl: 0 .  lidocaine-prilocaine (EMLA) cream, lidocaine-prilocaine 2.5 %-2.5 % topical cream, Disp: , Rfl:  .  meclizine (ANTIVERT) 25 MG tablet, Take 1 tablet (25 mg total) by mouth every 6 (six) hours as needed for dizziness., Disp: 30 tablet, Rfl: 0 .  montelukast (SINGULAIR) 10 MG tablet, Take 10 mg by mouth at bedtime. , Disp: , Rfl:  .  NON FORMULARY, Take 1 capsule by mouth daily. , Disp: , Rfl:  .  ondansetron (ZOFRAN-ODT) 4 MG disintegrating tablet, Take 4 mg by mouth every 8 (eight) hours as needed for nausea/vomiting., Disp: , Rfl:  .  Polyethyl Glycol-Propyl Glycol (SYSTANE) 0.4-0.3 % SOLN, Place 1 drop into both eyes 5 (five) times daily as needed (dry/irritated eyes). , Disp: , Rfl:  .  pyridOXINE (VITAMIN B-6) 100 MG tablet, Take 100 mg by mouth daily., Disp: , Rfl:  .  Selenium 200 MCG CAPS, Take 200 mcg by mouth daily., Disp: , Rfl:  .  sucralfate (CARAFATE) 1 g tablet, 1 tablet 3 (three) times daily as needed. , Disp: , Rfl:  .  Vitamin A 2250 MCG (7500 UT) CAPS, Take 1 capsule by mouth daily. , Disp: , Rfl:  .  vitamin E 400 UNIT capsule, Take 400 Units by mouth daily., Disp: , Rfl:  .  Zoledronic Acid (RECLAST IV), Inject 1 Dose into the vein as directed. ONCE A YEAR (Patient not taking: Reported on 01/30/2020), Disp: , Rfl:   Physical exam:  Vitals:   01/30/20 0845  BP: Marland Kitchen)  111/56  Pulse: 70  Resp: 16  Temp: (!) 97 F (36.1 C)  TempSrc: Tympanic  Weight: 160 lb (72.6 kg)  Height: $Remove'5\' 2"'pTIRpRk$  (1.575 m)   Physical Exam Constitutional:      General: She is not in acute distress. Cardiovascular:     Rate and Rhythm: Normal rate and regular rhythm.     Heart sounds: Normal  heart sounds.  Pulmonary:     Effort: Pulmonary effort is normal.     Breath sounds: Normal breath sounds.  Abdominal:     General: Bowel sounds are normal.     Palpations: Abdomen is soft.  Musculoskeletal:     Cervical back: Normal range of motion.  Skin:    General: Skin is warm and dry.  Neurological:     Mental Status: She is alert and oriented to person, place, and time.      CMP Latest Ref Rng & Units 01/30/2020  Glucose 70 - 99 mg/dL 101(H)  BUN 8 - 23 mg/dL 12  Creatinine 0.44 - 1.00 mg/dL 0.51  Sodium 135 - 145 mmol/L 136  Potassium 3.5 - 5.1 mmol/L 4.1  Chloride 98 - 111 mmol/L 101  CO2 22 - 32 mmol/L 27  Calcium 8.9 - 10.3 mg/dL 9.1  Total Protein 6.5 - 8.1 g/dL 6.5  Total Bilirubin 0.3 - 1.2 mg/dL 0.6  Alkaline Phos 38 - 126 U/L 72  AST 15 - 41 U/L 21  ALT 0 - 44 U/L 15   CBC Latest Ref Rng & Units 01/30/2020  WBC 4.0 - 10.5 K/uL 4.2  Hemoglobin 12.0 - 15.0 g/dL 12.1  Hematocrit 36 - 46 % 35.7(L)  Platelets 150 - 400 K/uL 232    No images are attached to the encounter.  CT CHEST ABDOMEN PELVIS W CONTRAST  Result Date: 01/10/2020 CLINICAL DATA:  74 year old female with history of stage III adenocarcinoma of the lung. Follow-up evaluation to assess for treatment response. Chemo radiation therapy complete 03/12/2019. EXAM: CT CHEST, ABDOMEN, AND PELVIS WITH CONTRAST TECHNIQUE: Multidetector CT imaging of the chest, abdomen and pelvis was performed following the standard protocol during bolus administration of intravenous contrast. CONTRAST:  146mL OMNIPAQUE IOHEXOL 300 MG/ML  SOLN COMPARISON:  CT the chest, abdomen and pelvis 09/08/2019. FINDINGS: CT CHEST FINDINGS Cardiovascular: Heart size is normal. There is no significant pericardial fluid, thickening or pericardial calcification. There is aortic atherosclerosis, as well as atherosclerosis of the great vessels of the mediastinum and the coronary arteries, including calcified atherosclerotic plaque in the left  main, left anterior descending and right coronary arteries. Right internal jugular single-lumen porta cath with tip terminating in the superior aspect of the right atrium. Mediastinum/Nodes: No pathologically enlarged mediastinal or hilar lymph nodes. Esophagus is unremarkable in appearance. No axillary lymphadenopathy. Lungs/Pleura: Increasing mass-like architectural distortion in the left lung, favored to predominantly reflect evolving postradiation mass-like fibrosis. However, there are 2 areas that warrant close attention on follow-up studies. Specifically, in the medial aspect of the left upper lobe (axial image 14 of series 2) there is a 3.9 x 1.9 cm mass-like area which is new compared to the prior study. Additionally, in the perihilar aspect of the left upper lobe (axial image 23 of series 2) there is a 2.1 x 1.1 cm nodular area which is also increasingly prominent compared to the prior study. Small cluster of 2-3 mm nodules in the posterior aspect of the right lower lobe (axial image 87 of series 4), nonspecific, but likely reflective of areas of  mucoid impaction within terminal bronchioles. Tiny calcified granuloma in the anterior aspect of the right upper lobe. No pleural effusions. Mild diffuse bronchial wall thickening with very mild centrilobular and paraseptal emphysema. Musculoskeletal: Orthopedic fixation hardware in the lower cervical spine incidentally noted. There are no aggressive appearing lytic or blastic lesions noted in the visualized portions of the skeleton. CT ABDOMEN PELVIS FINDINGS Hepatobiliary: No suspicious cystic or solid hepatic lesions. Status post cholecystectomy. Mild intra and extrahepatic biliary ductal dilatation, stable compared to the prior study, likely to reflect chronic post cholecystectomy physiology. Pancreas: No pancreatic mass. No pancreatic ductal dilatation. No pancreatic or peripancreatic fluid collections or inflammatory changes. Spleen: Unremarkable.  Adrenals/Urinary Tract: Bilateral kidneys and adrenal glands are normal in appearance. No hydroureteronephrosis. Urinary bladder is largely obscured by beam hardening artifact from the patient's bilateral hip arthroplasties. Stomach/Bowel: The appearance of the stomach is normal. No pathologic dilatation of small bowel or colon. Numerous colonic diverticulae are noted, without surrounding inflammatory changes to suggest an acute diverticulitis at this time. The appendix is not confidently identified and may be surgically absent. Regardless, there are no inflammatory changes noted adjacent to the cecum to suggest the presence of an acute appendicitis at this time. Vascular/Lymphatic: Aortic atherosclerosis, without evidence of aneurysm or dissection in the abdominal or pelvic vasculature. No lymphadenopathy noted in the abdomen or pelvis. Reproductive: Status post hysterectomy. Ovaries are not confidently identified may be surgically absent or atrophic, or obscured by beam hardening artifact. Other: No significant volume of ascites.  No pneumoperitoneum. Musculoskeletal: Status post bilateral hip arthroplasty. There are no aggressive appearing lytic or blastic lesions noted in the visualized portions of the skeleton. IMPRESSION: 1. Evolving postradiation changes in the left lung, with 2 areas of presumed postradiation fibrosis which warrant very close attention on follow-up studies. If there is clinical concern for recurrent disease, further evaluation with PET-CT should be considered at this time. In the absence of clinical concern for recurrent disease, close attention to these regions on routine follow-up imaging is highly recommended. 2. No definitive findings to suggest metastatic disease in the abdomen or pelvis. 3. Aortic atherosclerosis, in addition to left main and 2 vessel coronary artery disease. Assessment for potential risk factor modification, dietary therapy or pharmacologic therapy may be warranted,  if clinically indicated. 4. Mild diffuse bronchial wall thickening with mild centrilobular and paraseptal emphysema; imaging findings suggestive of underlying COPD. Aortic Atherosclerosis (ICD10-I70.0) and Emphysema (ICD10-J43.9). Electronically Signed   By: Vinnie Langton M.D.   On: 01/10/2020 12:31     Assessment and plan- Patient is a 74 y.o. female with stage III adenocarcinoma of the lung here for on treatment assessment prior to next cycle of maintenance durvalumab  Counts okay to proceed with next cycle of maintenance durvalumab today.  I will see her back in 2 weeks for cycle 22.  Plan is to complete 26 cycles. Her CT chest in oct 2021 showed Areas of radiation fibrosis.  No pathologically enlarged mediastinal or hilar lymph nodes.  But in the area of radiation in the left upper lobe there appeared to be a 3.9 cm masslike area which could be radiation changes versus possible recurrent malignancy.  My plan is to repeat scans end of December 2021.  Productive cough.  Patient has had upper respiratory congestion on and off for the last several months and she also gets antibiotics frequently which improve her symptoms somewhat but then symptoms come back.  Today I will hold off on giving her any further  antibiotics at this time.  She does not appear to be short of breath lungs sound clear to auscultation.  She has not had an overt fever.  If her symptoms worsen we will consider getting a chest x-ray at that time    Visit Diagnosis 1. Encounter for antineoplastic immunotherapy   2. Malignant neoplasm of upper lobe of left lung ([place])      Dr. Randa Evens, MD, MPH [facility] [phone] 01/30/2020 9:04 AM

## 2020-01-30 NOTE — Progress Notes (Signed)
Patient tolerated infusion well. Patient and VSS. Discharged home  

## 2020-02-13 ENCOUNTER — Inpatient Hospital Stay: Payer: Medicare Other

## 2020-02-13 ENCOUNTER — Inpatient Hospital Stay (HOSPITAL_BASED_OUTPATIENT_CLINIC_OR_DEPARTMENT_OTHER): Payer: Medicare Other | Admitting: Oncology

## 2020-02-13 ENCOUNTER — Other Ambulatory Visit: Payer: Self-pay

## 2020-02-13 ENCOUNTER — Inpatient Hospital Stay: Payer: Medicare Other | Attending: Oncology

## 2020-02-13 ENCOUNTER — Encounter: Payer: Self-pay | Admitting: Oncology

## 2020-02-13 VITALS — BP 112/64 | HR 68 | Temp 97.9°F | Resp 18 | Wt 161.8 lb

## 2020-02-13 DIAGNOSIS — C3412 Malignant neoplasm of upper lobe, left bronchus or lung: Secondary | ICD-10-CM | POA: Diagnosis present

## 2020-02-13 DIAGNOSIS — D510 Vitamin B12 deficiency anemia due to intrinsic factor deficiency: Secondary | ICD-10-CM

## 2020-02-13 DIAGNOSIS — Z452 Encounter for adjustment and management of vascular access device: Secondary | ICD-10-CM | POA: Insufficient documentation

## 2020-02-13 DIAGNOSIS — Z79899 Other long term (current) drug therapy: Secondary | ICD-10-CM | POA: Diagnosis not present

## 2020-02-13 DIAGNOSIS — Z5112 Encounter for antineoplastic immunotherapy: Secondary | ICD-10-CM | POA: Insufficient documentation

## 2020-02-13 LAB — COMPREHENSIVE METABOLIC PANEL
ALT: 12 U/L (ref 0–44)
AST: 17 U/L (ref 15–41)
Albumin: 4 g/dL (ref 3.5–5.0)
Alkaline Phosphatase: 56 U/L (ref 38–126)
Anion gap: 8 (ref 5–15)
BUN: 13 mg/dL (ref 8–23)
CO2: 28 mmol/L (ref 22–32)
Calcium: 9.2 mg/dL (ref 8.9–10.3)
Chloride: 101 mmol/L (ref 98–111)
Creatinine, Ser: 0.51 mg/dL (ref 0.44–1.00)
GFR, Estimated: >60 mL/min (ref >60)
Glucose, Bld: 88 mg/dL (ref 70–99)
Potassium: 3.8 mmol/L (ref 3.5–5.1)
Sodium: 137 mmol/L (ref 135–145)
Total Bilirubin: 0.5 mg/dL (ref 0.3–1.2)
Total Protein: 6.7 g/dL (ref 6.5–8.1)

## 2020-02-13 LAB — CBC WITH DIFFERENTIAL/PLATELET
Abs Immature Granulocytes: 0.02 10*3/uL (ref 0.00–0.07)
Basophils Absolute: 0 10*3/uL (ref 0.0–0.1)
Basophils Relative: 0 %
Eosinophils Absolute: 0.1 10*3/uL (ref 0.0–0.5)
Eosinophils Relative: 3 %
HCT: 35.8 % — ABNORMAL LOW (ref 36.0–46.0)
Hemoglobin: 11.8 g/dL — ABNORMAL LOW (ref 12.0–15.0)
Immature Granulocytes: 0 %
Lymphocytes Relative: 19 %
Lymphs Abs: 0.9 10*3/uL (ref 0.7–4.0)
MCH: 29.9 pg (ref 26.0–34.0)
MCHC: 33 g/dL (ref 30.0–36.0)
MCV: 90.9 fL (ref 80.0–100.0)
Monocytes Absolute: 0.4 10*3/uL (ref 0.1–1.0)
Monocytes Relative: 9 %
Neutro Abs: 3.3 10*3/uL (ref 1.7–7.7)
Neutrophils Relative %: 69 %
Platelets: 210 10*3/uL (ref 150–400)
RBC: 3.94 MIL/uL (ref 3.87–5.11)
RDW: 12.7 % (ref 11.5–15.5)
WBC: 4.8 10*3/uL (ref 4.0–10.5)
nRBC: 0 % (ref 0.0–0.2)

## 2020-02-13 MED ORDER — SODIUM CHLORIDE 0.9% FLUSH
10.0000 mL | Freq: Once | INTRAVENOUS | Status: AC
  Administered 2020-02-13: 10 mL via INTRAVENOUS
  Filled 2020-02-13: qty 10

## 2020-02-13 MED ORDER — HEPARIN SOD (PORK) LOCK FLUSH 100 UNIT/ML IV SOLN
500.0000 [IU] | Freq: Once | INTRAVENOUS | Status: AC
  Administered 2020-02-13: 500 [IU] via INTRAVENOUS
  Filled 2020-02-13: qty 5

## 2020-02-13 NOTE — Progress Notes (Signed)
Hematology/Oncology Consult note [facility]  [phone] [phone]  Patient Care Team: [name], MD as PCP - General (Family Medicine) Telford Nab, RN as Registered Nurse   Name of the patient: Jennifer Hodges  [id number]  Sep 16, 1945   Date of visit: 02/13/20  Diagnosis- Adenocarcinoma of the lung stage III T1b N2 M0  Chief complaint/ Reason for visit-on treatment assessment prior to next cycle of maintenance durvalumab  Heme/Onc history: patient is a 74 year old female who was seen by pulmonary and ENT for ongoing symptoms of bronchitis and possible laryngitis and received antibiotics for the same.She underwent CT chest in September 2020 which showed multiple bilateral lung nodules with associated mediastinal adenopathy and possible primary left upper lobe lung mass. This was followed by a PET CT scan which showed a left upper lobe lung mass measuring 1.9 x 1.2 cm with an SUV of 10.7. Conglomerate AP window adenopathy measuring 5.8 x 2.1 cm with an SUV of 14. She was also noted to have hypermetabolic left hilar adenopathy and left supraclavicular 0.7 cm lymph node with an SUV of 4.6. Noted to have a 7 mm right lower lobe lung nodule with faint metabolic activity. No evidence of other distant metastatic disease. This was followed by a CT super D chest without contrast. The right lower lobe lung nodules were somewhat larger as compared to September 2020. 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 Maintenance durvalumab started in January 2021after scan showed partial response.  Interval history-patient reports that about a week ago she had worsening cough and sputum production and was seen by Dr. [name] who prescribed her course of azithromycin and prednisone.  She has only started taking prednisone yesterday and is currently on 40 mg.  She will be completing her steroid course over the next 1 week.  Reports that her symptoms are currently improving  ECOG PS- 1 Pain scale- 0   Review of systems- Review of Systems  Constitutional: Negative for chills, fever, malaise/fatigue and weight loss.  HENT: Negative for congestion, ear discharge and nosebleeds.   Eyes: Negative for blurred vision.  Respiratory: Positive for cough. Negative for hemoptysis, sputum production, shortness of breath and wheezing.   Cardiovascular: Negative for chest pain, palpitations, orthopnea and claudication.  Gastrointestinal: Negative for abdominal pain, blood in stool, constipation, diarrhea, heartburn, melena, nausea and vomiting.  Genitourinary: Negative for dysuria, flank pain, frequency, hematuria and urgency.  Musculoskeletal: Negative for back pain, joint pain and myalgias.  Skin: Negative for rash.  Neurological: Negative for dizziness, tingling, focal weakness, seizures, weakness and headaches.  Endo/Heme/Allergies: Does not bruise/bleed easily.  Psychiatric/Behavioral: Negative for depression and suicidal ideas. The patient does not have insomnia.       Allergies  Allergen Reactions  . Aspirin Hives and Other (See Comments)    Difficulty breathing  . Celebrex [Celecoxib] Shortness Of Breath  . Morphine And Related Nausea And Vomiting and Swelling  . Adhesive [Tape] Other (See Comments)    Took top layer of skin off when removed.  . Clarithromycin Nausea And Vomiting  . Codeine Nausea And Vomiting  . Darvon [Propoxyphene Hcl]  Nausea And Vomiting  .  Demerol [Meperidine] Nausea And Vomiting  . Flonase [Fluticasone Propionate] Other (See Comments)    Fungal infection  . Simvastatin Other (See Comments)    "caused ulcers in mouth, and fever"  . Talwin [Pentazocine] Nausea And Vomiting     Past Medical History:  Diagnosis Date  . Anemia   . Asthma    No Inhalers--Dr. [name] will order as needed  . Bronchiectasis (HCC)    mild  . Chronic headaches     followed by [facility] migraines  . COPD (chronic obstructive pulmonary disease) ([place])   . DDD (degenerative disc disease), lumbar   . Diabetes mellitus without complication [facility])    "doctor says I no longer have diabetes"    . Diverticulosis   . Family history of adverse reaction to anesthesia    PONV  . Gall stones    history of  . GERD (gastroesophageal reflux disease)    EGD 8/09- non bleeding erosive gastritis, documentd esophageal ulcerations.   . Hiatal hernia   . History of pneumonia   . Hypercholesterolemia   . IBS (irritable bowel syndrome)   . Malignant neoplasm of upper lobe of left lung ([place]) 12/27/2018  . Meniere disease   . Murmur   . Osteoarthritis    lumbar disc disease, left hip  . PONV (postoperative nausea and vomiting)    "Only with last hip and I believe it was due to the morphine"   . Sleep apnea    cpap asked to bring mask and tubing  . Vertigo   . Weakness of right side of body      Past Surgical History:  Procedure Laterality Date  . ABDOMINAL HYSTERECTOMY  age 55  . ANTERIOR CERVICAL DECOMP/DISCECTOMY FUSION N/A 02/24/2015   Procedure: CERVICAL FOUR-FIVE, CERVICAL FIVE-SIX, CERVICAL SIX-SEVEN ANTERIOR CERVICAL DECOMPRESSION/DISCECTOMY FUSION ;  Surgeon: [name], MD;  Location: [place] [facility];  Service: Neurosurgery;  Laterality: N/A;  C45 C56 C67 anterior cervical decompression with fusion interbody prosthesis plating and bonegraft  . APPENDECTOMY    . BACK SURGERY     4th lumbar fusion  . BLADDER SURGERY N/A    with vaginal  wall repair  . BREAST CYST ASPIRATION Bilateral    neg  . BREAST SURGERY Bilateral    cyst removed and reduction  . CARDIAC CATHETERIZATION  2014  . CARPAL TUNNEL RELEASE Right 02/11/2016   Procedure: CARPAL TUNNEL RELEASE;  Surgeon: [name], MD;  Location: [facility] ORS;  Service: Orthopedics;  Laterality: Right;  . CATARACT EXTRACTION W/ INTRAOCULAR LENS IMPLANT Bilateral 2015  . CHOLECYSTECTOMY    . EUS N/A 05/31/2012   Procedure: UPPER ENDOSCOPIC ULTRASOUND (EUS) LINEAR;  Surgeon: [name], MD;  Location: [facility];  Service: Endoscopy;  Laterality: N/A;  . EXCISIONAL HEMORRHOIDECTOMY    . JOINT REPLACEMENT Bilateral   . KNEE ARTHROSCOPY WITH LATERAL MENISECTOMY Right 07/07/2015   Procedure: KNEE ARTHROSCOPY WITH LATERAL MENISECTOMY, PARTIAL SYNOVECTOMY;  Surgeon: [name], MD;  Location: [facility] OR[facility];  Service: Orthopedics;  Laterality: Right;  . LUMBAR LAMINECTOMY    . PORTA CATH INSERTION N/A 01/07/2019   Procedure: PORTA CATH INSERTION;  Surgeon: [name], MD;  Location: [place] [facility];  Service: Cardiovascular;  Laterality: N/A;  . REDUCTION MAMMAPLASTY  1990  . RIGHT OOPHORECTOMY    . TOTAL HIP ARTHROPLASTY Left 05/01/2014   Dr. [name]  . TOTAL HIP ARTHROPLASTY Right 08/05/2014   Procedure: TOTAL HIP ARTHROPLASTY ANTERIOR  APPROACH;  Surgeon: Kennedy Bucker, MD;  Location: [facility] OR[facility];  Service: Orthopedics;  Laterality: Right;  . ULNAR NERVE TRANSPOSITION Right 02/11/2016   Procedure: ULNAR NERVE DECOMPRESSION/TRANSPOSITION;  Surgeon: Kennedy Bucker, MD;  Location: [facility];  Service: Orthopedics;  Laterality: Right;  Marland Kitchen VIDEO BRONCHOSCOPY WITH ENDOBRONCHIAL ULTRASOUND Left 12/19/2018   Procedure: VIDEO BRONCHOSCOPY WITH ENDOBRONCHIAL ULTRASOUND, LEFT, SLEEP APNEA;  Surgeon: Vida Rigger, MD;  Location: [facility] ORS;  Service: Thoracic;  Laterality: Left;    Social History   Socioeconomic History  . Marital status: Married    Spouse name: Not on file  .  Number of children: Not on file  . Years of education: Not on file  . Highest education level: Not on file  Occupational History  . Not on file  Tobacco Use  . Smoking status: Former Smoker    Quit date: 06/13/2007    Years since quitting: 12.6  . Smokeless tobacco: Never Used  Vaping Use  . Vaping Use: Never used  Substance and Sexual Activity  . Alcohol use: No    Alcohol/week: 0.0 standard drinks  . Drug use: No  . Sexual activity: Never  Other Topics Concern  . Not on file  Social History Narrative  . Not on file   Social Determinants of Health   Financial Resource Strain:   . Difficulty of Paying Living Expenses: Not on file  Food Insecurity:   . Worried About Programme researcher, broadcasting/film/video in the Last Year: Not on file  . Ran Out of Food in the Last Year: Not on file  Transportation Needs:   . Lack of Transportation (Medical): Not on file  . Lack of Transportation (Non-Medical): Not on file  Physical Activity:   . Days of Exercise per Week: Not on file  . Minutes of Exercise per Session: Not on file  Stress:   . Feeling of Stress : Not on file  Social Connections:   . Frequency of Communication with Friends and Family: Not on file  . Frequency of Social Gatherings with Friends and Family: Not on file  . Attends Religious Services: Not on file  . Active Member of Clubs or Organizations: Not on file  . Attends Banker Meetings: Not on file  . Marital Status: Not on file  Intimate Partner Violence:   . Fear of Current or Ex-Partner: Not on file  . Emotionally Abused: Not on file  . Physically Abused: Not on file  . Sexually Abused: Not on file    Family History  Problem Relation Age of Onset  . Heart disease Mother        s/p stent  . Hypertension Mother   . Hypercholesterolemia Mother   . Diabetes Father   . Stomach cancer Other        uncle  . Breast cancer Cousin   . Breast cancer Paternal Aunt      Current Outpatient Medications:  .  albuterol  (PROVENTIL) (2.5 MG/3ML) 0.083% nebulizer solution, Take 2.5 mg by nebulization every 6 (six) hours as needed for wheezing., Disp: , Rfl:  .  albuterol (VENTOLIN HFA) 108 (90 Base) MCG/ACT inhaler, Inhale 2 puffs into the lungs every 6 (six) hours as needed for wheezing or shortness of breath., Disp: 18 g, Rfl: 0 .  azithromycin (ZITHROMAX) 250 MG tablet, TK 2 TS PO ON DAY 1, THEN TK 1 T PO D FOR 4 DAYS, Disp: , Rfl:  .  Bioflavonoid Products (ESTER C PO), Take 1 tablet  by mouth 3 (three) times daily., Disp: , Rfl:  .  Calcium-Magnesium-Zinc (CAL-MAG-ZINC PO), Take 1 tablet by mouth daily., Disp: , Rfl:  .  cetirizine (ZYRTEC) 5 MG tablet, Take 1 tablet (5 mg total) by mouth daily. (Patient taking differently: Take 5 mg by mouth daily as needed (runny nose.). ), Disp: 30 tablet, Rfl: 0 .  CHLORASEPTIC MAX SORE THROAT 1.5-33 % LIQD, Use as directed 1 spray in the mouth or throat as needed (throat irritation.). , Disp: , Rfl:  .  Cholecalciferol (EQL VITAMIN D3) 25 MCG (1000 UT) capsule, Take 4,000 Units by mouth daily., Disp: , Rfl:  .  Clotrimazole 1 % OINT, Apply 1 application topically in the morning, at noon, and at bedtime., Disp: 56.7 g, Rfl: 0 .  Coenzyme Q10 (CO Q 10) 100 MG CAPS, Take 100 mg by mouth daily. , Disp: , Rfl:  .  Cyanocobalamin (VITAMIN B-12) 2500 MCG SUBL, Place 2,500 mcg under the tongue daily., Disp: , Rfl:  .  cyclobenzaprine (FLEXERIL) 10 MG tablet, Take 10 mg by mouth at bedtime. , Disp: , Rfl:  .  DHEA 25 MG CAPS, Take 25 mg by mouth daily., Disp: , Rfl:  .  Ensure (ENSURE), Take 237 mLs by mouth 2 (two) times daily between meals., Disp: , Rfl:  .  ezetimibe (ZETIA) 10 MG tablet, Take 5 mg by mouth in the morning and at bedtime. , Disp: , Rfl:  .  folic acid (FOLVITE) 497 MCG tablet, Take 400 mcg by mouth 2 (two) times daily. , Disp: , Rfl:  .  Garlic 0263 MG CAPS, Take 1,000 mg by mouth daily., Disp: , Rfl:  .  Histamine Dihydrochloride (AUSTRALIAN DREAM ARTHRITIS EX),  Apply 1 application topically 4 (four) times daily as needed (pain.)., Disp: , Rfl:  .  HYDROcodone-acetaminophen (NORCO/VICODIN) 5-325 MG tablet, 1-2 tabs po bid prn (Patient taking differently: Take 1 tablet by mouth at bedtime. ), Disp: 10 tablet, Rfl: 0 .  HYDROcodone-homatropine (HYCODAN) 5-1.5 MG/5ML syrup, Take 5 mLs by mouth 3 (three) times daily as needed for cough., Disp: 120 mL, Rfl: 0 .  lidocaine-prilocaine (EMLA) cream, lidocaine-prilocaine 2.5 %-2.5 % topical cream, Disp: , Rfl:  .  meclizine (ANTIVERT) 25 MG tablet, Take 1 tablet (25 mg total) by mouth every 6 (six) hours as needed for dizziness., Disp: 30 tablet, Rfl: 0 .  montelukast (SINGULAIR) 10 MG tablet, Take 10 mg by mouth at bedtime. , Disp: , Rfl:  .  NON FORMULARY, Take 1 capsule by mouth daily. , Disp: , Rfl:  .  ondansetron (ZOFRAN-ODT) 4 MG disintegrating tablet, Take 4 mg by mouth every 8 (eight) hours as needed for nausea/vomiting., Disp: , Rfl:  .  Polyethyl Glycol-Propyl Glycol (SYSTANE) 0.4-0.3 % SOLN, Place 1 drop into both eyes 5 (five) times daily as needed (dry/irritated eyes). , Disp: , Rfl:  .  predniSONE (DELTASONE) 10 MG tablet, Prednisone 10 mg, 4 pills q day x 2 days, 3 pills q day x 2 days, 2 pills q day x 2 days, 1 pill q day x 2 days., Disp: , Rfl:  .  pyridOXINE (VITAMIN B-6) 100 MG tablet, Take 100 mg by mouth daily., Disp: , Rfl:  .  Selenium 200 MCG CAPS, Take 200 mcg by mouth daily., Disp: , Rfl:  .  sucralfate (CARAFATE) 1 g tablet, 1 tablet 3 (three) times daily as needed. , Disp: , Rfl:  .  UBRELVY 100 MG TABS, Take by mouth., Disp: ,  Rfl:  .  Vitamin A 2250 MCG (7500 UT) CAPS, Take 1 capsule by mouth daily. , Disp: , Rfl:  .  vitamin E 400 UNIT capsule, Take 400 Units by mouth daily., Disp: , Rfl:  .  Zoledronic Acid (RECLAST IV), Inject 1 Dose into the vein as directed. ONCE A YEAR, Disp: , Rfl:   Physical exam:  Vitals:   02/13/20 0900  BP: 112/64  Pulse: 68  Resp: 18  Temp: 97.9 F  (36.6 C)  TempSrc: Tympanic  SpO2: 96%  Weight: 161 lb 12.8 oz (73.4 kg)   Physical Exam Constitutional:      General: She is not in acute distress. Cardiovascular:     Rate and Rhythm: Normal rate and regular rhythm.     Heart sounds: Normal heart sounds.  Pulmonary:     Effort: Pulmonary effort is normal.     Breath sounds: Normal breath sounds.  Abdominal:     General: Bowel sounds are normal.     Palpations: Abdomen is soft.  Skin:    General: Skin is warm and dry.  Neurological:     Mental Status: She is alert and oriented to person, place, and time.      CMP Latest Ref Rng & Units 02/13/2020  Glucose 70 - 99 mg/dL 88  BUN 8 - 23 mg/dL 13  Creatinine 0.44 - 1.00 mg/dL 0.51  Sodium 135 - 145 mmol/L 137  Potassium 3.5 - 5.1 mmol/L 3.8  Chloride 98 - 111 mmol/L 101  CO2 22 - 32 mmol/L 28  Calcium 8.9 - 10.3 mg/dL 9.2  Total Protein 6.5 - 8.1 g/dL 6.7  Total Bilirubin 0.3 - 1.2 mg/dL 0.5  Alkaline Phos 38 - 126 U/L 56  AST 15 - 41 U/L 17  ALT 0 - 44 U/L 12   CBC Latest Ref Rng & Units 02/13/2020  WBC 4.0 - 10.5 K/uL 4.8  Hemoglobin 12.0 - 15.0 g/dL 11.8(L)  Hematocrit 36 - 46 % 35.8(L)  Platelets 150 - 400 K/uL 210     Assessment and plan- Patient is a 74 y.o. female with stage III adenocarcinoma of the lung here for on treatment assessment prior to next cycle of maintenance durvalumab  Patient is currently on high-dose steroids and I will therefore hold off on giving her durvalumab today.  She will be completing her steroid course in the next 1 week.  I will see her back in 2 weeks to restart her durvalumab which goes on until end of January.  I will plan to get scans in early January   Visit Diagnosis 1. Malignant neoplasm of upper lobe of left lung ([place])   2. Vitamin B12 deficiency anemia due to intrinsic factor deficiency      Dr. Randa Evens, MD, MPH [facility] at [facility] [phone] 02/13/2020 1:36 PM

## 2020-02-17 ENCOUNTER — Ambulatory Visit
Admission: RE | Admit: 2020-02-17 | Discharge: 2020-02-17 | Disposition: A | Discharge status: Home / Self Care | Payer: Medicare Other | Source: Ambulatory Visit | Attending: Family Medicine | Admitting: Family Medicine

## 2020-02-17 ENCOUNTER — Other Ambulatory Visit: Payer: Self-pay

## 2020-02-17 DIAGNOSIS — Z1231 Encounter for screening mammogram for malignant neoplasm of breast: Secondary | ICD-10-CM | POA: Insufficient documentation

## 2020-02-17 HISTORY — DX: Personal history of antineoplastic chemotherapy: Z92.21

## 2020-02-17 HISTORY — DX: Personal history of irradiation: Z92.3

## 2020-02-27 ENCOUNTER — Inpatient Hospital Stay (HOSPITAL_BASED_OUTPATIENT_CLINIC_OR_DEPARTMENT_OTHER): Payer: Medicare Other | Admitting: Oncology

## 2020-02-27 ENCOUNTER — Inpatient Hospital Stay: Payer: Medicare Other

## 2020-02-27 ENCOUNTER — Ambulatory Visit: Payer: Medicare Other | Admitting: Radiation Oncology

## 2020-02-27 ENCOUNTER — Encounter: Payer: Self-pay | Admitting: Oncology

## 2020-02-27 VITALS — BP 112/57 | HR 71 | Temp 97.2°F | Resp 18 | Wt 163.3 lb

## 2020-02-27 DIAGNOSIS — Z5112 Encounter for antineoplastic immunotherapy: Secondary | ICD-10-CM | POA: Diagnosis not present

## 2020-02-27 DIAGNOSIS — C3412 Malignant neoplasm of upper lobe, left bronchus or lung: Secondary | ICD-10-CM

## 2020-02-27 LAB — COMPREHENSIVE METABOLIC PANEL
ALT: 15 U/L (ref 0–44)
AST: 21 U/L (ref 15–41)
Albumin: 3.9 g/dL (ref 3.5–5.0)
Alkaline Phosphatase: 69 U/L (ref 38–126)
Anion gap: 8 (ref 5–15)
BUN: 13 mg/dL (ref 8–23)
CO2: 26 mmol/L (ref 22–32)
Calcium: 8.9 mg/dL (ref 8.9–10.3)
Chloride: 102 mmol/L (ref 98–111)
Creatinine, Ser: 0.4 mg/dL — ABNORMAL LOW (ref 0.44–1.00)
GFR, Estimated: >60 mL/min (ref >60)
Glucose, Bld: 86 mg/dL (ref 70–99)
Potassium: 4 mmol/L (ref 3.5–5.1)
Sodium: 136 mmol/L (ref 135–145)
Total Bilirubin: 0.5 mg/dL (ref 0.3–1.2)
Total Protein: 6.4 g/dL — ABNORMAL LOW (ref 6.5–8.1)

## 2020-02-27 LAB — CBC WITH DIFFERENTIAL/PLATELET
Abs Immature Granulocytes: 0.01 10*3/uL (ref 0.00–0.07)
Basophils Absolute: 0 10*3/uL (ref 0.0–0.1)
Basophils Relative: 1 %
Eosinophils Absolute: 0.1 10*3/uL (ref 0.0–0.5)
Eosinophils Relative: 4 %
HCT: 35.3 % — ABNORMAL LOW (ref 36.0–46.0)
Hemoglobin: 11.9 g/dL — ABNORMAL LOW (ref 12.0–15.0)
Immature Granulocytes: 0 %
Lymphocytes Relative: 20 %
Lymphs Abs: 0.7 10*3/uL (ref 0.7–4.0)
MCH: 30.5 pg (ref 26.0–34.0)
MCHC: 33.7 g/dL (ref 30.0–36.0)
MCV: 90.5 fL (ref 80.0–100.0)
Monocytes Absolute: 0.3 10*3/uL (ref 0.1–1.0)
Monocytes Relative: 10 %
Neutro Abs: 2.3 10*3/uL (ref 1.7–7.7)
Neutrophils Relative %: 65 %
Platelets: 180 10*3/uL (ref 150–400)
RBC: 3.9 MIL/uL (ref 3.87–5.11)
RDW: 13.1 % (ref 11.5–15.5)
WBC: 3.5 10*3/uL — ABNORMAL LOW (ref 4.0–10.5)
nRBC: 0 % (ref 0.0–0.2)

## 2020-02-27 LAB — TSH: TSH: 3.672 u[IU]/mL (ref 0.350–4.500)

## 2020-02-27 MED ORDER — HEPARIN SOD (PORK) LOCK FLUSH 100 UNIT/ML IV SOLN
500.0000 [IU] | Freq: Once | INTRAVENOUS | Status: AC | PRN
  Administered 2020-02-27: 500 [IU]
  Filled 2020-02-27: qty 5

## 2020-02-27 MED ORDER — HEPARIN SOD (PORK) LOCK FLUSH 100 UNIT/ML IV SOLN
INTRAVENOUS | Status: AC
  Filled 2020-02-27: qty 5

## 2020-02-27 MED ORDER — SODIUM CHLORIDE 0.9 % IV SOLN
Freq: Once | INTRAVENOUS | Status: AC
  Filled 2020-02-27: qty 250

## 2020-02-27 MED ORDER — SODIUM CHLORIDE 0.9 % IV SOLN
10.0000 mg/kg | Freq: Once | INTRAVENOUS | Status: AC
  Administered 2020-02-27: 740 mg via INTRAVENOUS
  Filled 2020-02-27: qty 10

## 2020-02-27 NOTE — Progress Notes (Signed)
1226- Patient tolerated treatment well. Patient stable and discharged to home at this time.

## 2020-02-27 NOTE — Progress Notes (Signed)
Hematology/Oncology Consult note [facility]  Telephone:([phone] [phone]  Patient Care Team: [name], MD as PCP - General (Family Medicine) Telford Nab, RN as Registered Nurse   Name of the patient: Jennifer Hodges  [id number]  1945/11/15   Date of visit: 02/27/20  Diagnosis- Adenocarcinoma of the lung stage III T1b N2 M0  Chief complaint/ Reason for visit-on treatment assessment prior to next cycle of maintenance durvalumab  Heme/Onc history: patient is a 74 year old female who was seen by pulmonary and ENT for ongoing symptoms of bronchitis and possible laryngitis and received antibiotics for the same.She underwent CT chest in September 2020 which showed multiple bilateral lung nodules with associated mediastinal adenopathy and possible primary left upper lobe lung mass. This was followed by a PET CT scan which showed a left upper lobe lung mass measuring 1.9 x 1.2 cm with an SUV of 10.7. Conglomerate AP window adenopathy measuring 5.8 x 2.1 cm with an SUV of 14. She was also noted to have hypermetabolic left hilar adenopathy and left supraclavicular 0.7 cm lymph node with an SUV of 4.6. Noted to have a 7 mm right lower lobe lung nodule with faint metabolic activity. No evidence of other distant metastatic disease. This was followed by a CT super D chest without contrast. The right lower lobe lung nodules were somewhat larger as compared to September 2020. 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 Maintenance durvalumab started in January 2021after scan showed partial response.   Interval history-patient reports having a dry hacking cough but overall feels better.  She is now off her steroids for the last 2 weeks.  ECOG PS- 1 Pain scale- 0   Review of systems- Review of Systems  Constitutional: Positive for malaise/fatigue. Negative for chills, fever and weight loss.  HENT: Negative for congestion, ear discharge and nosebleeds.   Eyes: Negative for blurred vision.  Respiratory: Positive for cough. Negative for hemoptysis, sputum production, shortness of breath and wheezing.   Cardiovascular: Negative for chest pain, palpitations, orthopnea and claudication.  Gastrointestinal: Negative for abdominal pain, blood in stool, constipation, diarrhea, heartburn, melena, nausea and vomiting.  Genitourinary: Negative for dysuria, flank pain, frequency, hematuria and urgency.  Musculoskeletal: Negative for back pain, joint pain and myalgias.  Skin: Negative for rash.  Neurological: Negative for dizziness, tingling, focal weakness, seizures, weakness and headaches.  Endo/Heme/Allergies: Does not bruise/bleed easily.  Psychiatric/Behavioral: Negative for depression and suicidal ideas. The patient does not have insomnia.      Allergies  Allergen Reactions  . Aspirin Hives and Other (See Comments)    Difficulty breathing  . Celebrex [Celecoxib] Shortness Of Breath  . Morphine And Related Nausea And Vomiting and Swelling  . Adhesive [Tape] Other (See Comments)    Took top layer of skin off when removed.  . Clarithromycin Nausea And Vomiting  . Codeine Nausea And Vomiting  . Darvon [Propoxyphene Hcl] Nausea And Vomiting  . Demerol [Meperidine] Nausea And Vomiting  . Flonase [Fluticasone Propionate] Other (See Comments)    Fungal infection  . Simvastatin Other (See Comments)    "caused ulcers in mouth, and fever"  .  Talwin [Pentazocine] Nausea  And Vomiting     Past Medical History:  Diagnosis Date  . Anemia   . Asthma    No Inhalers--Dr. [name] will order as needed  . Bronchiectasis (HCC)    mild  . Chronic headaches     followed by [facility] migraines  . COPD (chronic obstructive pulmonary disease) ([place])   . DDD (degenerative disc disease), lumbar   . Diabetes mellitus without complication [facility])    "doctor says I no longer have diabetes"    . Diverticulosis   . Family history of adverse reaction to anesthesia    PONV  . Gall stones    history of  . GERD (gastroesophageal reflux disease)    EGD 8/09- non bleeding erosive gastritis, documentd esophageal ulcerations.   . Hiatal hernia   . History of pneumonia   . Hypercholesterolemia   . IBS (irritable bowel syndrome)   . Malignant neoplasm of upper lobe of left lung ([place]) 12/27/2018  . Meniere disease   . Murmur   . Osteoarthritis    lumbar disc disease, left hip  . Personal history of chemotherapy   . Personal history of radiation therapy   . PONV (postoperative nausea and vomiting)    "Only with last hip and I believe it was due to the morphine"   . Sleep apnea    cpap asked to bring mask and tubing  . Vertigo   . Weakness of right side of body      Past Surgical History:  Procedure Laterality Date  . ABDOMINAL HYSTERECTOMY  age 18  . ANTERIOR CERVICAL DECOMP/DISCECTOMY FUSION N/A 02/24/2015   Procedure: CERVICAL FOUR-FIVE, CERVICAL FIVE-SIX, CERVICAL SIX-SEVEN ANTERIOR CERVICAL DECOMPRESSION/DISCECTOMY FUSION ;  Surgeon: [name], MD;  Location: [place] [facility];  Service: Neurosurgery;  Laterality: N/A;  C45 C56 C67 anterior cervical decompression with fusion interbody prosthesis plating and bonegraft  . APPENDECTOMY    . BACK SURGERY     4th lumbar fusion  . BLADDER SURGERY N/A    with vaginal wall repair  . BREAST CYST ASPIRATION Bilateral    neg  . BREAST SURGERY Bilateral    cyst removed and reduction  . CARDIAC CATHETERIZATION   2014  . CARPAL TUNNEL RELEASE Right 02/11/2016   Procedure: CARPAL TUNNEL RELEASE;  Surgeon: [name], MD;  Location: [facility] ORS;  Service: Orthopedics;  Laterality: Right;  . CATARACT EXTRACTION W/ INTRAOCULAR LENS IMPLANT Bilateral 2015  . CHOLECYSTECTOMY    . EUS N/A 05/31/2012   Procedure: UPPER ENDOSCOPIC ULTRASOUND (EUS) LINEAR;  Surgeon: [name], MD;  Location: [facility];  Service: Endoscopy;  Laterality: N/A;  . EXCISIONAL HEMORRHOIDECTOMY    . JOINT REPLACEMENT Bilateral   . KNEE ARTHROSCOPY WITH LATERAL MENISECTOMY Right 07/07/2015   Procedure: KNEE ARTHROSCOPY WITH LATERAL MENISECTOMY, PARTIAL SYNOVECTOMY;  Surgeon: [name], MD;  Location: [facility];  Service: Orthopedics;  Laterality: Right;  . LUMBAR LAMINECTOMY    . PORTA CATH INSERTION N/A 01/07/2019   Procedure: PORTA CATH INSERTION;  Surgeon: [name], MD;  Location: [facility];  Service: Cardiovascular;  Laterality: N/A;  . REDUCTION MAMMAPLASTY  1990  . RIGHT OOPHORECTOMY    . TOTAL HIP ARTHROPLASTY Left 05/01/2014   Dr. [name]  . TOTAL HIP ARTHROPLASTY Right 08/05/2014   Procedure: TOTAL HIP ARTHROPLASTY ANTERIOR APPROACH;  Surgeon: [name], MD;  Location: [facility];  Service: Orthopedics;  Laterality: Right;  . ULNAR NERVE TRANSPOSITION Right 02/11/2016  Procedure: ULNAR NERVE DECOMPRESSION/TRANSPOSITION;  Surgeon: [name], MD;  Location: [facility];  Service: Orthopedics;  Laterality: Right;  Marland Kitchen VIDEO BRONCHOSCOPY WITH ENDOBRONCHIAL ULTRASOUND Left 12/19/2018   Procedure: VIDEO BRONCHOSCOPY WITH ENDOBRONCHIAL ULTRASOUND, LEFT, SLEEP APNEA;  Surgeon: [name], MD;  Location: [facility];  Service: Thoracic;  Laterality: Left;    Social History   Socioeconomic History  . Marital status: Married    Spouse name: Not on file  . Number of children: Not on file  . Years of education: Not on file  . Highest education level: Not on file  Occupational History  . Not on file   Tobacco Use  . Smoking status: Former Smoker    Quit date: 06/13/2007    Years since quitting: 12.7  . Smokeless tobacco: Never Used  Vaping Use  . Vaping Use: Never used  Substance and Sexual Activity  . Alcohol use: No    Alcohol/week: 0.0 standard drinks  . Drug use: No  . Sexual activity: Never  Other Topics Concern  . Not on file  Social History Narrative  . Not on file   Social Determinants of Health   Financial Resource Strain: Not on file  Food Insecurity: Not on file  Transportation Needs: Not on file  Physical Activity: Not on file  Stress: Not on file  Social Connections: Not on file  Intimate Partner Violence: Not on file    Family History  Problem Relation Age of Onset  . Heart disease Mother        s/p stent  . Hypertension Mother   . Hypercholesterolemia Mother   . Diabetes Father   . Stomach cancer Other        uncle  . Breast cancer Cousin   . Breast cancer Paternal Aunt      Current Outpatient Medications:  .  albuterol (PROVENTIL) (2.5 MG/3ML) 0.083% nebulizer solution, Take 2.5 mg by nebulization every 6 (six) hours as needed for wheezing., Disp: , Rfl:  .  albuterol (VENTOLIN HFA) 108 (90 Base) MCG/ACT inhaler, Inhale 2 puffs into the lungs every 6 (six) hours as needed for wheezing or shortness of breath., Disp: 18 g, Rfl: 0 .  Bioflavonoid Products (ESTER C PO), Take 1 tablet by mouth 3 (three) times daily., Disp: , Rfl:  .  Calcium-Magnesium-Zinc (CAL-MAG-ZINC PO), Take 1 tablet by mouth daily., Disp: , Rfl:  .  cetirizine (ZYRTEC) 5 MG tablet, Take 1 tablet (5 mg total) by mouth daily. (Patient taking differently: Take 5 mg by mouth daily as needed (runny nose.).), Disp: 30 tablet, Rfl: 0 .  CHLORASEPTIC MAX SORE THROAT 1.5-33 % LIQD, Use as directed 1 spray in the mouth or throat as needed (throat irritation.). , Disp: , Rfl:  .  Cholecalciferol (EQL VITAMIN D3) 25 MCG (1000 UT) capsule, Take 4,000 Units by mouth daily., Disp: , Rfl:  .   Clotrimazole 1 % OINT, Apply 1 application topically in the morning, at noon, and at bedtime., Disp: 56.7 g, Rfl: 0 .  Coenzyme Q10 (CO Q 10) 100 MG CAPS, Take 100 mg by mouth daily. , Disp: , Rfl:  .  Cyanocobalamin (VITAMIN B-12) 2500 MCG SUBL, Place 2,500 mcg under the tongue daily., Disp: , Rfl:  .  cyclobenzaprine (FLEXERIL) 10 MG tablet, Take 10 mg by mouth at bedtime., Disp: , Rfl:  .  DHEA 25 MG CAPS, Take 25 mg by mouth daily., Disp: , Rfl:  .  Ensure (ENSURE), Take 237 mLs by mouth 2 (  two) times daily between meals., Disp: , Rfl:  .  ezetimibe (ZETIA) 10 MG tablet, Take 5 mg by mouth in the morning and at bedtime. , Disp: , Rfl:  .  folic acid (FOLVITE) 093 MCG tablet, Take 400 mcg by mouth 2 (two) times daily. , Disp: , Rfl:  .  Garlic 8182 MG CAPS, Take 1,000 mg by mouth daily., Disp: , Rfl:  .  Histamine Dihydrochloride (AUSTRALIAN DREAM ARTHRITIS EX), Apply 1 application topically 4 (four) times daily as needed (pain.)., Disp: , Rfl:  .  HYDROcodone-acetaminophen (NORCO/VICODIN) 5-325 MG tablet, 1-2 tabs po bid prn (Patient taking differently: Take 1 tablet by mouth at bedtime.), Disp: 10 tablet, Rfl: 0 .  HYDROcodone-homatropine (HYCODAN) 5-1.5 MG/5ML syrup, Take 5 mLs by mouth 3 (three) times daily as needed for cough., Disp: 120 mL, Rfl: 0 .  lidocaine-prilocaine (EMLA) cream, lidocaine-prilocaine 2.5 %-2.5 % topical cream, Disp: , Rfl:  .  meclizine (ANTIVERT) 25 MG tablet, Take 1 tablet (25 mg total) by mouth every 6 (six) hours as needed for dizziness., Disp: 30 tablet, Rfl: 0 .  montelukast (SINGULAIR) 10 MG tablet, Take 10 mg by mouth at bedtime. , Disp: , Rfl:  .  NON FORMULARY, Take 1 capsule by mouth daily. , Disp: , Rfl:  .  ondansetron (ZOFRAN-ODT) 4 MG disintegrating tablet, Take 4 mg by mouth every 8 (eight) hours as needed for nausea/vomiting., Disp: , Rfl:  .  Polyethyl Glycol-Propyl Glycol (SYSTANE) 0.4-0.3 % SOLN, Place 1 drop into both eyes 5 (five) times daily as  needed (dry/irritated eyes). , Disp: , Rfl:  .  pyridOXINE (VITAMIN B-6) 100 MG tablet, Take 100 mg by mouth daily., Disp: , Rfl:  .  Selenium 200 MCG CAPS, Take 200 mcg by mouth daily., Disp: , Rfl:  .  sucralfate (CARAFATE) 1 g tablet, 1 tablet 3 (three) times daily as needed. , Disp: , Rfl:  .  UBRELVY 100 MG TABS, Take by mouth., Disp: , Rfl:  .  Vitamin A 2250 MCG (7500 UT) CAPS, Take 1 capsule by mouth daily. , Disp: , Rfl:  .  vitamin E 400 UNIT capsule, Take 400 Units by mouth daily., Disp: , Rfl:  .  Zoledronic Acid (RECLAST IV), Inject 1 Dose into the vein as directed. ONCE A YEAR, Disp: , Rfl:   Physical exam:  Vitals:   02/27/20 1019  BP: (!) 112/57  Pulse: 71  Resp: 18  Temp: (!) 97.2 F (36.2 C)  TempSrc: Tympanic  SpO2: 98%  Weight: 163 lb 4.8 oz (74.1 kg)   Physical Exam Constitutional:      General: She is not in acute distress. Eyes:     Extraocular Movements: EOM normal.  Cardiovascular:     Rate and Rhythm: Normal rate and regular rhythm.     Heart sounds: Normal heart sounds.  Pulmonary:     Effort: Pulmonary effort is normal.     Breath sounds: Normal breath sounds.  Abdominal:     General: Bowel sounds are normal.     Palpations: Abdomen is soft.  Skin:    General: Skin is warm and dry.  Neurological:     Mental Status: She is alert and oriented to person, place, and time.      CMP Latest Ref Rng & Units 02/27/2020  Glucose 70 - 99 mg/dL 86  BUN 8 - 23 mg/dL 13  Creatinine 0.44 - 1.00 mg/dL 0.40(L)  Sodium 135 - 145 mmol/L 136  Potassium  3.5 - 5.1 mmol/L 4.0  Chloride 98 - 111 mmol/L 102  CO2 22 - 32 mmol/L 26  Calcium 8.9 - 10.3 mg/dL 8.9  Total Protein 6.5 - 8.1 g/dL 6.4(L)  Total Bilirubin 0.3 - 1.2 mg/dL 0.5  Alkaline Phos 38 - 126 U/L 69  AST 15 - 41 U/L 21  ALT 0 - 44 U/L 15   CBC Latest Ref Rng & Units 02/27/2020  WBC 4.0 - 10.5 K/uL 3.5(L)  Hemoglobin 12.0 - 15.0 g/dL 11.9(L)  Hematocrit 36.0 - 46.0 % 35.3(L)  Platelets 150 -  400 K/uL 180    No images are attached to the encounter.  MM 3D SCREEN BREAST BILATERAL  Result Date: 02/18/2020 CLINICAL DATA:  Screening. Personal history of LEFT UPPER LOBE lung cancer diagnosed in October, 2020. Remote bilateral reduction mammoplasty in 1990. EXAM: DIGITAL SCREENING BILATERAL MAMMOGRAM WITH TOMO AND CAD COMPARISON:  Previous exam(s). ACR Breast Density Category b: There are scattered areas of fibroglandular density. FINDINGS: There are no findings suspicious for malignancy. Images were processed with CAD. IMPRESSION: No mammographic evidence of malignancy. Port-A-Cath port overlies the low right axilla on the MLO image. A result letter of this screening mammogram will be mailed directly to the patient. RECOMMENDATION: Screening mammogram in one year. (Code:SM-B-01Y) BI-RADS CATEGORY  1: Negative. Electronically Signed   By: Evangeline Dakin M.D.   On: 02/18/2020 14:34     Assessment and plan- Patient is a 74 y.o. female with stage III adenocarcinoma of the lung here for on treatment assessment prior to next cycle of maintenance durvalumab  Counts okay to proceed with next cycle of maintenance durvalumab today.  I will see her back in 3 weeks for cycle 23.  Plan is to complete 26 cycles.  I will repeat CT chest abdomen pelvis sometime in the end of January   Visit Diagnosis 1. Encounter for antineoplastic immunotherapy   2. Malignant neoplasm of upper lobe of left lung ([place])      Dr. Randa Evens, MD, MPH [facility] [phone] 02/27/2020 3:53 PM

## 2020-03-10 ENCOUNTER — Encounter: Payer: Self-pay | Admitting: Radiation Oncology

## 2020-03-11 ENCOUNTER — Ambulatory Visit
Admission: RE | Admit: 2020-03-11 | Discharge: 2020-03-11 | Disposition: A | Discharge status: Home / Self Care | Payer: Medicare Other | Source: Ambulatory Visit | Attending: Radiation Oncology | Admitting: Radiation Oncology

## 2020-03-11 ENCOUNTER — Encounter: Payer: Self-pay | Admitting: Radiation Oncology

## 2020-03-11 ENCOUNTER — Other Ambulatory Visit: Payer: Self-pay

## 2020-03-11 VITALS — BP 113/67 | HR 76 | Temp 96.7°F | Wt 161.0 lb

## 2020-03-11 DIAGNOSIS — C3412 Malignant neoplasm of upper lobe, left bronchus or lung: Secondary | ICD-10-CM

## 2020-03-11 NOTE — Progress Notes (Signed)
Radiation Oncology Follow up Note  Name: Jennifer Hodges   Date:   03/11/2020 MRN:  [id number] DOB: 18-Jun-1945    This 74 y.o. female presents to the clinic today for patient is a 74 year old female now at 11 months having completed concurrent chemoradiation therapy for stage IIIa (T1b N2 M0) non-small cell lung cancer favoring adenocarcinoma. Of the left lung  REFERRING PROVIDER: [name], MD  HPI: Patient is a 74 year old female now at 18 months having completed concurrent chemoradiation therapy for stage III adenocarcinoma of the left upper lobe. She initially had hypermetabolic activity in the left hilar and left supraclavicular lymph nodes.. She completed concurrent chemoradiation therapy with weekly carbotaxol and is currently on maintenance durvalumab. She has been tolerating her immunotherapy well. She did continue to have a dry hacking cough although that has improved. 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 There is no findings suggest metastatic disease in the abdomen or pelvis. She does have dysphagia although this is related prior to her lung cancer treatment for instrumentation of her spine.  COMPLICATIONS OF TREATMENT: none  FOLLOW UP COMPLIANCE: keeps appointments   PHYSICAL EXAM:  BP 113/67   Pulse 76   Temp (!) 96.7 F (35.9 C) (Tympanic)   Wt 161 lb (73 kg)   SpO2 99% Comment: room air  BMI 29.45 kg/m  Well-developed well-nourished patient in NAD. HEENT reveals PERLA, EOMI, discs not visualized.  Oral cavity is clear. No oral mucosal lesions are identified. Neck is clear without evidence of cervical or supraclavicular adenopathy. Lungs are clear to A&P. Cardiac examination is essentially unremarkable with regular rate and rhythm without murmur rub or thrill. Abdomen is benign with no organomegaly or masses noted. Motor sensory and DTR levels are equal and symmetric in  the upper and lower extremities. Cranial nerves II through XII are grossly intact. Proprioception is intact. No peripheral adenopathy or edema is identified. No motor or sensory levels are noted. Crude visual fields are within normal range.  RADIOLOGY RESULTS: CT scan reviewed compatible with above-stated findings  PLAN: Present time patient is doing well stable disease currently on immunotherapy under medical oncology's direction. I am pleased with her overall progress. Of asked to see her back in 6 months for follow-up. Patient knows to call with any concerns at any time.  I would like to take this opportunity to thank you for allowing me to participate in the care of your patient.Noreene Filbert, MD

## 2020-03-18 NOTE — Progress Notes (Signed)
Hematology/Oncology Consult note [facility]  [phone] [phone]  Patient Care Team: [name], MD as PCP - General (Family Medicine) Telford Nab, RN as Registered Nurse   Name of the patient: Jennifer Hodges  [id number]  Jun 11, 1945   Date of visit: 03/18/20  Diagnosis- Adenocarcinoma of the lung stage III T1b N2 M0  Chief complaint/ Reason for visit-on treatment assessment prior to next cycle of maintenance durvalumab  Heme/Onc history: patient is a 75 year old female who was seen by pulmonary and ENT for ongoing symptoms of bronchitis and possible laryngitis and received antibiotics for the same.She underwent CT chest in September 2020 which showed multiple bilateral lung nodules with associated mediastinal adenopathy and possible primary left upper lobe lung mass. This was followed by a PET CT scan which showed a left upper lobe lung mass measuring 1.9 x 1.2 cm with an SUV of 10.7. Conglomerate AP window adenopathy measuring 5.8 x 2.1 cm with an SUV of 14. She was also noted to have hypermetabolic left hilar adenopathy and left supraclavicular 0.7 cm lymph node with an SUV of 4.6. Noted to have a 7 mm right lower lobe lung nodule with faint metabolic activity. No evidence of other distant metastatic disease. This was followed by a CT super D chest without contrast. The right lower lobe lung nodules were somewhat larger as compared to September 2020. 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 Maintenance durvalumab started in January 2021after scan showed partial response.   Interval history-has occasional cough and has symptoms of vertigo from inner ear pathology which is essentially stable and she follows up with ENT for this.  Denies any new complaints at this time  ECOG PS- 1 Pain scale- 0 Opioid associated constipation- no  Review of systems- Review of Systems  Constitutional: Positive for malaise/fatigue. Negative for chills, fever and weight loss.  HENT: Negative for congestion, ear discharge and nosebleeds.   Eyes: Negative for blurred vision.  Respiratory: Positive for cough. Negative for hemoptysis, sputum production, shortness of breath and wheezing.   Cardiovascular: Negative for chest pain, palpitations, orthopnea and claudication.  Gastrointestinal: Negative for abdominal pain, blood in stool, constipation, diarrhea, heartburn, melena, nausea and vomiting.  Genitourinary: Negative for dysuria, flank pain, frequency, hematuria and urgency.  Musculoskeletal: Negative for back pain, joint pain and myalgias.  Skin: Negative for rash.  Neurological: Negative for dizziness, tingling, focal weakness, seizures, weakness and headaches.  Endo/Heme/Allergies: Does not bruise/bleed easily.  Psychiatric/Behavioral: Negative for depression and suicidal ideas. The patient does not have insomnia.       Allergies  Allergen Reactions  . Aspirin Hives and Other (See Comments)    Difficulty breathing  . Celebrex [Celecoxib] Shortness Of Breath  . Morphine And Related Nausea And Vomiting and Swelling  . Adhesive [Tape] Other (See Comments)    Took top layer of skin off when removed.  . Clarithromycin Nausea And Vomiting  . Codeine Nausea And Vomiting  . Darvon [Propoxyphene Hcl] Nausea And Vomiting  . Demerol [Meperidine] Nausea And Vomiting  . Flonase [Fluticasone Propionate] Other (See Comments)    Fungal infection  .  Simvastatin  Other (See Comments)    "caused ulcers in mouth, and fever"  . Talwin [Pentazocine] Nausea And Vomiting     Past Medical History:  Diagnosis Date  . Anemia   . Asthma    No Inhalers--Dr. [name] will order as needed  . Bronchiectasis (HCC)    mild  . Chronic headaches     followed by [facility] migraines  . COPD (chronic obstructive pulmonary disease) ([place])   . DDD (degenerative disc disease), lumbar   . Diabetes mellitus without complication [facility])    "doctor says I no longer have diabetes"    . Diverticulosis   . Family history of adverse reaction to anesthesia    PONV  . Gall stones    history of  . GERD (gastroesophageal reflux disease)    EGD 8/09- non bleeding erosive gastritis, documentd esophageal ulcerations.   . Hiatal hernia   . History of pneumonia   . Hypercholesterolemia   . IBS (irritable bowel syndrome)   . Malignant neoplasm of upper lobe of left lung ([place]) 12/27/2018  . Meniere disease   . Murmur   . Osteoarthritis    lumbar disc disease, left hip  . Personal history of chemotherapy   . Personal history of radiation therapy   . PONV (postoperative nausea and vomiting)    "Only with last hip and I believe it was due to the morphine"   . Sleep apnea    cpap asked to bring mask and tubing  . Vertigo   . Weakness of right side of body      Past Surgical History:  Procedure Laterality Date  . ABDOMINAL HYSTERECTOMY  age 35  . ANTERIOR CERVICAL DECOMP/DISCECTOMY FUSION N/A 02/24/2015   Procedure: CERVICAL FOUR-FIVE, CERVICAL FIVE-SIX, CERVICAL SIX-SEVEN ANTERIOR CERVICAL DECOMPRESSION/DISCECTOMY FUSION ;  Surgeon: [name], MD;  Location: [place] [facility];  Service: Neurosurgery;  Laterality: N/A;  C45 C56 C67 anterior cervical decompression with fusion interbody prosthesis plating and bonegraft  . APPENDECTOMY    . BACK SURGERY     4th lumbar fusion  . BLADDER SURGERY N/A    with vaginal wall repair  . BREAST CYST ASPIRATION Bilateral     neg  . BREAST SURGERY Bilateral    cyst removed and reduction  . CARDIAC CATHETERIZATION  2014  . CARPAL TUNNEL RELEASE Right 02/11/2016   Procedure: CARPAL TUNNEL RELEASE;  Surgeon: [name], MD;  Location: [facility] ORS;  Service: Orthopedics;  Laterality: Right;  . CATARACT EXTRACTION W/ INTRAOCULAR LENS IMPLANT Bilateral 2015  . CHOLECYSTECTOMY    . EUS N/A 05/31/2012   Procedure: UPPER ENDOSCOPIC ULTRASOUND (EUS) LINEAR;  Surgeon: [name], MD;  Location: [facility];  Service: Endoscopy;  Laterality: N/A;  . EXCISIONAL HEMORRHOIDECTOMY    . JOINT REPLACEMENT Bilateral   . KNEE ARTHROSCOPY WITH LATERAL MENISECTOMY Right 07/07/2015   Procedure: KNEE ARTHROSCOPY WITH LATERAL MENISECTOMY, PARTIAL SYNOVECTOMY;  Surgeon: [name], MD;  Location: [facility] OR[facility];  Service: Orthopedics;  Laterality: Right;  . LUMBAR LAMINECTOMY    . PORTA CATH INSERTION N/A 01/07/2019   Procedure: PORTA CATH INSERTION;  Surgeon: [name], MD;  Location: [place] [facility];  Service: Cardiovascular;  Laterality: N/A;  . REDUCTION MAMMAPLASTY  1990  . RIGHT OOPHORECTOMY    . TOTAL HIP ARTHROPLASTY Left 05/01/2014   Dr. [name]  . TOTAL HIP ARTHROPLASTY Right 08/05/2014   Procedure: TOTAL HIP ARTHROPLASTY ANTERIOR APPROACH;  Surgeon: [name], MD;  Location: [facility] OR[facility];  Service: Orthopedics;  Laterality: Right;  . ULNAR NERVE TRANSPOSITION Right 02/11/2016   Procedure: ULNAR NERVE DECOMPRESSION/TRANSPOSITION;  Surgeon: [name], MD;  Location: [facility];  Service: Orthopedics;  Laterality: Right;  Marland Kitchen VIDEO BRONCHOSCOPY WITH ENDOBRONCHIAL ULTRASOUND Left 12/19/2018   Procedure: VIDEO BRONCHOSCOPY WITH ENDOBRONCHIAL ULTRASOUND, LEFT, SLEEP APNEA;  Surgeon: [name], MD;  Location: [facility] ORS;  Service: Thoracic;  Laterality: Left;    Social History   Socioeconomic History  . Marital status: Married    Spouse name: Not on file  . Number of children: Not on file  . Years of education:  Not on file  . Highest education level: Not on file  Occupational History  . Not on file  Tobacco Use  . Smoking status: Former Smoker    Quit date: 06/13/2007    Years since quitting: 12.7  . Smokeless tobacco: Never Used  Vaping Use  . Vaping Use: Never used  Substance and Sexual Activity  . Alcohol use: No    Alcohol/week: 0.0 standard drinks  . Drug use: No  . Sexual activity: Never  Other Topics Concern  . Not on file  Social History Narrative  . Not on file   Social Determinants of Health   Financial Resource Strain: Not on file  Food Insecurity: Not on file  Transportation Needs: Not on file  Physical Activity: Not on file  Stress: Not on file  Social Connections: Not on file  Intimate Partner Violence: Not on file    Family History  Problem Relation Age of Onset  . Heart disease Mother        s/p stent  . Hypertension Mother   . Hypercholesterolemia Mother   . Diabetes Father   . Stomach cancer Other        uncle  . Breast cancer Cousin   . Breast cancer Paternal Aunt      Current Outpatient Medications:  .  albuterol (PROVENTIL) (2.5 MG/3ML) 0.083% nebulizer solution, Take 2.5 mg by nebulization every 6 (six) hours as needed for wheezing., Disp: , Rfl:  .  albuterol (VENTOLIN HFA) 108 (90 Base) MCG/ACT inhaler, Inhale 2 puffs into the lungs every 6 (six) hours as needed for wheezing or shortness of breath., Disp: 18 g, Rfl: 0 .  Bioflavonoid Products (ESTER C PO), Take 1 tablet by mouth 3 (three) times daily., Disp: , Rfl:  .  Calcium-Magnesium-Zinc (CAL-MAG-ZINC PO), Take 1 tablet by mouth daily., Disp: , Rfl:  .  cetirizine (ZYRTEC) 5 MG tablet, Take 1 tablet (5 mg total) by mouth daily. (Patient taking differently: Take 5 mg by mouth daily as needed (runny nose.).), Disp: 30 tablet, Rfl: 0 .  CHLORASEPTIC MAX SORE THROAT 1.5-33 % LIQD, Use as directed 1 spray in the mouth or throat as needed (throat irritation.). , Disp: , Rfl:  .  Cholecalciferol (EQL  VITAMIN D3) 25 MCG (1000 UT) capsule, Take 4,000 Units by mouth daily., Disp: , Rfl:  .  Clotrimazole 1 % OINT, Apply 1 application topically in the morning, at noon, and at bedtime., Disp: 56.7 g, Rfl: 0 .  Coenzyme Q10 (CO Q 10) 100 MG CAPS, Take 100 mg by mouth daily. , Disp: , Rfl:  .  Cyanocobalamin (VITAMIN B-12) 2500 MCG SUBL, Place 2,500 mcg under the tongue daily., Disp: , Rfl:  .  cyclobenzaprine (FLEXERIL) 10 MG tablet, Take 10 mg by mouth at bedtime., Disp: , Rfl:  .  DHEA 25 MG CAPS, Take 25 mg by mouth daily.,  Disp: , Rfl:  .  Ensure (ENSURE), Take 237 mLs by mouth 2 (two) times daily between meals., Disp: , Rfl:  .  ezetimibe (ZETIA) 10 MG tablet, Take 5 mg by mouth in the morning and at bedtime. , Disp: , Rfl:  .  folic acid (FOLVITE) 295 MCG tablet, Take 400 mcg by mouth 2 (two) times daily. , Disp: , Rfl:  .  Garlic 6213 MG CAPS, Take 1,000 mg by mouth daily., Disp: , Rfl:  .  Histamine Dihydrochloride (AUSTRALIAN DREAM ARTHRITIS EX), Apply 1 application topically 4 (four) times daily as needed (pain.)., Disp: , Rfl:  .  HYDROcodone-acetaminophen (NORCO/VICODIN) 5-325 MG tablet, 1-2 tabs po bid prn (Patient taking differently: Take 1 tablet by mouth at bedtime.), Disp: 10 tablet, Rfl: 0 .  HYDROcodone-homatropine (HYCODAN) 5-1.5 MG/5ML syrup, Take 5 mLs by mouth 3 (three) times daily as needed for cough., Disp: 120 mL, Rfl: 0 .  lidocaine-prilocaine (EMLA) cream, lidocaine-prilocaine 2.5 %-2.5 % topical cream, Disp: , Rfl:  .  meclizine (ANTIVERT) 25 MG tablet, Take 1 tablet (25 mg total) by mouth every 6 (six) hours as needed for dizziness., Disp: 30 tablet, Rfl: 0 .  montelukast (SINGULAIR) 10 MG tablet, Take 10 mg by mouth at bedtime. , Disp: , Rfl:  .  NON FORMULARY, Take 1 capsule by mouth daily. , Disp: , Rfl:  .  ondansetron (ZOFRAN-ODT) 4 MG disintegrating tablet, Take 4 mg by mouth every 8 (eight) hours as needed for nausea/vomiting., Disp: , Rfl:  .  Polyethyl  Glycol-Propyl Glycol (SYSTANE) 0.4-0.3 % SOLN, Place 1 drop into both eyes 5 (five) times daily as needed (dry/irritated eyes). , Disp: , Rfl:  .  pyridOXINE (VITAMIN B-6) 100 MG tablet, Take 100 mg by mouth daily., Disp: , Rfl:  .  Selenium 200 MCG CAPS, Take 200 mcg by mouth daily., Disp: , Rfl:  .  sucralfate (CARAFATE) 1 g tablet, 1 tablet 3 (three) times daily as needed. , Disp: , Rfl:  .  UBRELVY 100 MG TABS, Take by mouth., Disp: , Rfl:  .  Vitamin A 2250 MCG (7500 UT) CAPS, Take 1 capsule by mouth daily. , Disp: , Rfl:  .  vitamin E 400 UNIT capsule, Take 400 Units by mouth daily., Disp: , Rfl:  .  Zoledronic Acid (RECLAST IV), Inject 1 Dose into the vein as directed. ONCE A YEAR, Disp: , Rfl:   Physical exam:  Vitals:   03/19/20 0851  BP: (!) 138/56  Pulse: 68  Resp: 18  Temp: (!) 96.6 F (35.9 C)  TempSrc: Tympanic  SpO2: 100%  Weight: 161 lb 6.4 oz (73.2 kg)   Physical Exam Eyes:     Extraocular Movements: EOM normal.  Cardiovascular:     Rate and Rhythm: Normal rate and regular rhythm.     Heart sounds: Normal heart sounds.  Pulmonary:     Effort: Pulmonary effort is normal.     Breath sounds: Normal breath sounds.  Abdominal:     General: Bowel sounds are normal.     Palpations: Abdomen is soft.  Skin:    General: Skin is warm and dry.  Neurological:     Mental Status: She is alert and oriented to person, place, and time.      CMP Latest Ref Rng & Units 03/19/2020  Glucose 70 - 99 mg/dL 93  BUN 8 - 23 mg/dL 13  Creatinine 0.44 - 1.00 mg/dL 0.50  Sodium 135 - 145 mmol/L 138  Potassium 3.5 - 5.1 mmol/L 4.1  Chloride 98 - 111 mmol/L 103  CO2 22 - 32 mmol/L 28  Calcium 8.9 - 10.3 mg/dL 9.1  Total Protein 6.5 - 8.1 g/dL 6.5  Total Bilirubin 0.3 - 1.2 mg/dL 0.5  Alkaline Phos 38 - 126 U/L 64  AST 15 - 41 U/L 19  ALT 0 - 44 U/L 14   CBC Latest Ref Rng & Units 03/19/2020  WBC 4.0 - 10.5 K/uL 3.1(L)  Hemoglobin 12.0 - 15.0 g/dL 12.2  Hematocrit 36.0 - 46.0 %  37.0  Platelets 150 - 400 K/uL 211      Assessment and plan- Patient is a 75 y.o. female with stage III adenocarcinoma of the lung.  She is here for next cycle of maintenance durvalumab  Counts okay to proceed with cycle 23 of maintenance durvalumab today.  I will see her back in 2 weeks for cycle 24.  Needs CT chest abdomen and pelvis with contrast in 3 weeks  Mild leukopenia which waxes and wanes.  Continue to monitor   Visit Diagnosis 1. Encounter for antineoplastic immunotherapy   2. Malignant neoplasm of upper lobe of left lung ([place])      Dr. Randa Evens, MD, MPH [facility] [phone] 03/18/2020 10:06 PM

## 2020-03-19 ENCOUNTER — Inpatient Hospital Stay: Payer: Medicare Other

## 2020-03-19 ENCOUNTER — Encounter: Payer: Self-pay | Admitting: Oncology

## 2020-03-19 ENCOUNTER — Inpatient Hospital Stay: Payer: Medicare Other | Attending: Oncology

## 2020-03-19 ENCOUNTER — Inpatient Hospital Stay (HOSPITAL_BASED_OUTPATIENT_CLINIC_OR_DEPARTMENT_OTHER): Payer: Medicare Other | Admitting: Oncology

## 2020-03-19 VITALS — BP 138/56 | HR 68 | Temp 96.6°F | Resp 18 | Wt 161.4 lb

## 2020-03-19 DIAGNOSIS — Z5112 Encounter for antineoplastic immunotherapy: Secondary | ICD-10-CM | POA: Diagnosis present

## 2020-03-19 DIAGNOSIS — D72819 Decreased white blood cell count, unspecified: Secondary | ICD-10-CM | POA: Diagnosis not present

## 2020-03-19 DIAGNOSIS — C3412 Malignant neoplasm of upper lobe, left bronchus or lung: Secondary | ICD-10-CM | POA: Diagnosis present

## 2020-03-19 DIAGNOSIS — R490 Dysphonia: Secondary | ICD-10-CM | POA: Diagnosis not present

## 2020-03-19 LAB — COMPREHENSIVE METABOLIC PANEL
ALT: 14 U/L (ref 0–44)
AST: 19 U/L (ref 15–41)
Albumin: 4.2 g/dL (ref 3.5–5.0)
Alkaline Phosphatase: 64 U/L (ref 38–126)
Anion gap: 7 (ref 5–15)
BUN: 13 mg/dL (ref 8–23)
CO2: 28 mmol/L (ref 22–32)
Calcium: 9.1 mg/dL (ref 8.9–10.3)
Chloride: 103 mmol/L (ref 98–111)
Creatinine, Ser: 0.5 mg/dL (ref 0.44–1.00)
GFR, Estimated: >60 mL/min (ref >60)
Glucose, Bld: 93 mg/dL (ref 70–99)
Potassium: 4.1 mmol/L (ref 3.5–5.1)
Sodium: 138 mmol/L (ref 135–145)
Total Bilirubin: 0.5 mg/dL (ref 0.3–1.2)
Total Protein: 6.5 g/dL (ref 6.5–8.1)

## 2020-03-19 LAB — CBC WITH DIFFERENTIAL/PLATELET
Abs Immature Granulocytes: 0.01 10*3/uL (ref 0.00–0.07)
Basophils Absolute: 0 10*3/uL (ref 0.0–0.1)
Basophils Relative: 1 %
Eosinophils Absolute: 0.1 10*3/uL (ref 0.0–0.5)
Eosinophils Relative: 4 %
HCT: 37 % (ref 36.0–46.0)
Hemoglobin: 12.2 g/dL (ref 12.0–15.0)
Immature Granulocytes: 0 %
Lymphocytes Relative: 27 %
Lymphs Abs: 0.8 10*3/uL (ref 0.7–4.0)
MCH: 29.5 pg (ref 26.0–34.0)
MCHC: 33 g/dL (ref 30.0–36.0)
MCV: 89.6 fL (ref 80.0–100.0)
Monocytes Absolute: 0.4 10*3/uL (ref 0.1–1.0)
Monocytes Relative: 13 %
Neutro Abs: 1.7 10*3/uL (ref 1.7–7.7)
Neutrophils Relative %: 55 %
Platelets: 211 10*3/uL (ref 150–400)
RBC: 4.13 MIL/uL (ref 3.87–5.11)
RDW: 13.2 % (ref 11.5–15.5)
WBC: 3.1 10*3/uL — ABNORMAL LOW (ref 4.0–10.5)
nRBC: 0 % (ref 0.0–0.2)

## 2020-03-19 MED ORDER — SODIUM CHLORIDE 0.9 % IV SOLN
Freq: Once | INTRAVENOUS | Status: AC
  Filled 2020-03-19: qty 250

## 2020-03-19 MED ORDER — SODIUM CHLORIDE 0.9% FLUSH
10.0000 mL | Freq: Once | INTRAVENOUS | Status: AC
  Administered 2020-03-19: 10 mL via INTRAVENOUS
  Filled 2020-03-19: qty 10

## 2020-03-19 MED ORDER — SODIUM CHLORIDE 0.9 % IV SOLN
10.0000 mg/kg | Freq: Once | INTRAVENOUS | Status: AC
  Administered 2020-03-19: 740 mg via INTRAVENOUS
  Filled 2020-03-19: qty 4.8

## 2020-03-19 MED ORDER — HEPARIN SOD (PORK) LOCK FLUSH 100 UNIT/ML IV SOLN
500.0000 [IU] | Freq: Once | INTRAVENOUS | Status: AC | PRN
  Administered 2020-03-19: 500 [IU]
  Filled 2020-03-19: qty 5

## 2020-03-19 MED ORDER — HEPARIN SOD (PORK) LOCK FLUSH 100 UNIT/ML IV SOLN
INTRAVENOUS | Status: AC
  Filled 2020-03-19: qty 5

## 2020-03-19 NOTE — Progress Notes (Signed)
Pt tolerated infusion well with no signs of complications. VSS. Pt stable for discharge.   Jennifer Hodges  

## 2020-03-24 ENCOUNTER — Ambulatory Visit
Admission: EM | Admit: 2020-03-24 | Discharge: 2020-03-24 | Disposition: A | Discharge status: Home / Self Care | Payer: Medicare Other | Attending: Emergency Medicine | Admitting: Emergency Medicine

## 2020-03-24 ENCOUNTER — Other Ambulatory Visit: Payer: Self-pay

## 2020-03-24 DIAGNOSIS — Z1389 Encounter for screening for other disorder: Secondary | ICD-10-CM | POA: Diagnosis not present

## 2020-03-24 MED ORDER — TRIAMCINOLONE ACETONIDE 0.1 % EX CREA: 1.0000 "application " | TOPICAL_CREAM | Freq: Two times a day (BID) | CUTANEOUS | 0 refills | Status: DC

## 2020-03-24 NOTE — ED Provider Notes (Signed)
[facility]    CSN: [id number] Arrival date & time: 03/24/20  1030      History   Chief Complaint Chief Complaint  Patient presents with  . bumps on skin    HPI Jennifer Hodges is a 75 y.o. female.   HPI   75 year old female here for evaluation of bumps in her right groin that burn.  Patient reports that they have been present for almost a month.  She states that she had something similar in her left groin but her bumps were red at that time, her they were diagnosed as a fungus and treated with topical antifungal, and resolved.  These bumps are white and hard.  Patient states that she recently started biking and noticed that after she rides she has redness in her groin and its sore.  Past Medical History:  Diagnosis Date  . Anemia   . Asthma    No Inhalers--Dr. [name] will order as needed  . Bronchiectasis (HCC)    mild  . Chronic headaches     followed by [facility] migraines  . COPD (chronic obstructive pulmonary disease) ([place])   . DDD (degenerative disc disease), lumbar   . Diabetes mellitus without complication [facility])    "doctor says I no longer have diabetes"    . Diverticulosis   . Family history of adverse reaction to anesthesia    PONV  . Gall stones    history of  . GERD (gastroesophageal reflux disease)    EGD 8/09- non bleeding erosive gastritis, documentd esophageal ulcerations.   . Hiatal hernia   . History of pneumonia   . Hypercholesterolemia   . IBS (irritable bowel syndrome)   . Malignant neoplasm of upper lobe of left lung ([place]) 12/27/2018  . Meniere disease   . Murmur   . Osteoarthritis    lumbar disc disease, left hip  . Personal history of chemotherapy   . Personal history of radiation therapy   . PONV (postoperative nausea and vomiting)    "Only with last hip and I believe it was due to the morphine"   . Sleep apnea    cpap asked to bring mask and tubing  . Vertigo   . Weakness of right side of body     Patient  Active Problem List   Diagnosis Date Noted  . Benign breast lumps 01/11/2019  . Gout 01/11/2019  . Hives 01/11/2019  . Lung disease 01/11/2019  . Migraine headache 01/11/2019  . Goals of care, counseling/discussion 12/27/2018  . Malignant neoplasm of upper lobe of left lung ([place]) 12/27/2018  . Night sweats 08/07/2016  . 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 Osteopenia of multiple sites 04/27/2016  . Left carpal tunnel syndrome 02/02/2016  . Carotid stenosis, asymptomatic, bilateral 12/29/2015  . Prediabetes 09/28/2015  . Primary osteoarthritis involving multiple joints 09/28/2015  . Knee pain 05/11/2015  . Chest tightness 03/24/2015  . Stool incontinence 03/24/2015  . Urinary frequency 03/04/2015  . Cervical spondylosis with radiculopathy 02/24/2015  . Pre-syncope 02/19/2015  . URI (upper respiratory infection) 02/15/2015  . Neck pain 12/25/2014  . Pelvic pain in female 11/16/2014  . Primary osteoarthritis of hip 08/05/2014  . Heel pain 06/01/2014  . Diarrhea 06/01/2014  . Health care maintenance 06/01/2014  . Right leg pain 03/30/2014  . Right arm pain 03/30/2014  . Sinusitis 03/30/2014  . Internal nasal lesion 11/12/2013  . Other specified disorders of nose and nasal sinuses 11/12/2013  . Chronic tension-type headache, intractable 10/30/2013  .  Intractable migraine without aura and without status migrainosus 10/30/2013  . Occipital neuralgia of left side 10/30/2013  . IBS (irritable bowel syndrome) 09/03/2013  . Rib pain 08/04/2013  . Cervical radiculitis 08/01/2013  . Lumbar radiculitis 08/01/2013  . Palpitations 07/25/2013  . Benign essential hypertension 05/28/2013  . Neuralgia, neuritis, and radiculitis, unspecified 05/28/2013  . Vitamin D deficiency 05/28/2013  . Elevated AFP 04/28/2013  . Abnormality of alpha-fetoprotein 04/28/2013  . Osteoporosis 11/12/2012  . Incomplete emptying of bladder 07/11/2012  . Prolapse of vaginal vault after hysterectomy  07/11/2012  . Nonspecific (abnormal) findings on radiological and other examination of gastrointestinal tract 05/31/2012  . Bronchiectasis ([place]) 02/05/2012  . Sleep apnea 02/05/2012  . Degenerative disc disease, cervical 02/05/2012  . Hypercholesterolemia 02/05/2012  . GERD (gastroesophageal reflux disease) 02/05/2012  . Chronic headaches 02/05/2012    Past Surgical History:  Procedure Laterality Date  . ABDOMINAL HYSTERECTOMY  age 89  . ANTERIOR CERVICAL DECOMP/DISCECTOMY FUSION N/A 02/24/2015   Procedure: CERVICAL FOUR-FIVE, CERVICAL FIVE-SIX, CERVICAL SIX-SEVEN ANTERIOR CERVICAL DECOMPRESSION/DISCECTOMY FUSION ;  Surgeon: [name], MD;  Location: [place] [facility];  Service: Neurosurgery;  Laterality: N/A;  C45 C56 C67 anterior cervical decompression with fusion interbody prosthesis plating and bonegraft  . APPENDECTOMY    . BACK SURGERY     4th lumbar fusion  . BLADDER SURGERY N/A    with vaginal wall repair  . BREAST CYST ASPIRATION Bilateral    neg  . BREAST SURGERY Bilateral    cyst removed and reduction  . CARDIAC CATHETERIZATION  2014  . CARPAL TUNNEL RELEASE Right 02/11/2016   Procedure: CARPAL TUNNEL RELEASE;  Surgeon: [name], MD;  Location: [facility] ORS;  Service: Orthopedics;  Laterality: Right;  . CATARACT EXTRACTION W/ INTRAOCULAR LENS IMPLANT Bilateral 2015  . CHOLECYSTECTOMY    . EUS N/A 05/31/2012   Procedure: UPPER ENDOSCOPIC ULTRASOUND (EUS) LINEAR;  Surgeon: [name], MD;  Location: [facility];  Service: Endoscopy;  Laterality: N/A;  . EXCISIONAL HEMORRHOIDECTOMY    . JOINT REPLACEMENT Bilateral   . KNEE ARTHROSCOPY WITH LATERAL MENISECTOMY Right 07/07/2015   Procedure: KNEE ARTHROSCOPY WITH LATERAL MENISECTOMY, PARTIAL SYNOVECTOMY;  Surgeon: [name], MD;  Location: [facility] ORS;  Service: Orthopedics;  Laterality: Right;  . LUMBAR LAMINECTOMY    . PORTA CATH INSERTION N/A 01/07/2019   Procedure: PORTA CATH INSERTION;  Surgeon: [name], MD;   Location: [place] [facility];  Service: Cardiovascular;  Laterality: N/A;  . REDUCTION MAMMAPLASTY  1990  . RIGHT OOPHORECTOMY    . TOTAL HIP ARTHROPLASTY Left 05/01/2014   Dr. [name]  . TOTAL HIP ARTHROPLASTY Right 08/05/2014   Procedure: TOTAL HIP ARTHROPLASTY ANTERIOR APPROACH;  Surgeon: [name], MD;  Location: [facility];  Service: Orthopedics;  Laterality: Right;  . ULNAR NERVE TRANSPOSITION Right 02/11/2016   Procedure: ULNAR NERVE DECOMPRESSION/TRANSPOSITION;  Surgeon: [name], MD;  Location: [facility] OR[facility];  Service: Orthopedics;  Laterality: Right;  Marland Kitchen VIDEO BRONCHOSCOPY WITH ENDOBRONCHIAL ULTRASOUND Left 12/19/2018   Procedure: VIDEO BRONCHOSCOPY WITH ENDOBRONCHIAL ULTRASOUND, LEFT, SLEEP APNEA;  Surgeon: [name], MD;  Location: [facility] ORS;  Service: Thoracic;  Laterality: Left;    OB History   No obstetric history on file.      Home Medications    Prior to Admission medications   Medication Sig Start Date End Date Taking? Authorizing Provider  albuterol (PROVENTIL) (2.5 MG/3ML) 0.083% nebulizer solution Take 2.5 mg by nebulization every 6 (six) hours as needed for wheezing.  [provider]  albuterol (VENTOLIN HFA) 108 (90 Base) MCG/ACT inhaler Inhale 2 puffs into the lungs every 6 (six) hours as needed for wheezing or shortness of breath. 10/18/18   Katy Apo, NP  Bioflavonoid Products (ESTER C PO) Take 1 tablet by mouth 3 (three) times daily.    [provider]  Calcium-Magnesium-Zinc (CAL-MAG-ZINC PO) Take 1 tablet by mouth daily.    [provider]  cetirizine (ZYRTEC) 5 MG tablet Take 1 tablet (5 mg total) by mouth daily. Patient taking differently: Take 5 mg by mouth daily as needed (runny nose.). 04/17/18   Cook, Jayce G, DO  CHLORASEPTIC MAX SORE THROAT 1.5-33 % LIQD Use as directed 1 spray in the mouth or throat as needed (throat irritation.).     [provider]  Cholecalciferol (EQL VITAMIN D3) 25 MCG (1000  UT) capsule Take 4,000 Units by mouth daily.    [provider]  Clotrimazole 1 % OINT Apply 1 application topically in the morning, at noon, and at bedtime. 12/19/19   Sindy Guadeloupe, MD  Coenzyme Q10 (CO Q 10) 100 MG CAPS Take 100 mg by mouth daily.     [provider]  Cyanocobalamin (VITAMIN B-12) 2500 MCG SUBL Place 2,500 mcg under the tongue daily.    [provider]  cyclobenzaprine (FLEXERIL) 10 MG tablet Take 10 mg by mouth at bedtime.    [provider]  DHEA 25 MG CAPS Take 25 mg by mouth daily.    [provider]  Ensure (ENSURE) Take 237 mLs by mouth 2 (two) times daily between meals.    [provider]  ezetimibe (ZETIA) 10 MG tablet Take 5 mg by mouth in the morning and at bedtime.  10/26/18   [provider]  folic acid (FOLVITE) 263 MCG tablet Take 400 mcg by mouth 2 (two) times daily.     [provider]  Garlic 7858 MG CAPS Take 1,000 mg by mouth daily.    [provider]  Histamine Dihydrochloride (AUSTRALIAN DREAM ARTHRITIS EX) Apply 1 application topically 4 (four) times daily as needed (pain.).    [provider]  HYDROcodone-acetaminophen (NORCO/VICODIN) 5-325 MG tablet 1-2 tabs po bid prn Patient taking differently: Take 1 tablet by mouth at bedtime. 12/09/18   Norval Gable, MD  HYDROcodone-homatropine [facility]) 5-1.5 MG/5ML syrup Take 5 mLs by mouth 3 (three) times daily as needed for cough. 01/01/19   Sindy Guadeloupe, MD  lidocaine-prilocaine (EMLA) cream lidocaine-prilocaine 2.5 %-2.5 % topical cream    [provider]  meclizine (ANTIVERT) 25 MG tablet Take 1 tablet (25 mg total) by mouth every 6 (six) hours as needed for dizziness. 06/11/19   Sindy Guadeloupe, MD  montelukast (SINGULAIR) 10 MG tablet Take 10 mg by mouth at bedtime.  11/22/18   [provider]  NON FORMULARY Take 1 capsule by mouth daily.     [provider]  ondansetron (ZOFRAN-ODT) 4 MG  disintegrating tablet Take 4 mg by mouth every 8 (eight) hours as needed for nausea/vomiting. 08/27/18   [provider]  Polyethyl Glycol-Propyl Glycol (SYSTANE) 0.4-0.3 % SOLN Place 1 drop into both eyes 5 (five) times daily as needed (dry/irritated eyes).     [provider]  pyridOXINE (VITAMIN B-6) 100 MG tablet Take 100 mg by mouth daily.    [provider]  Selenium 200 MCG CAPS Take 200 mcg by mouth daily.    [provider]  sucralfate ([place])  1 g tablet 1 tablet 3 (three) times daily as needed.  02/13/19   [provider]  UBRELVY 100 MG TABS Take by mouth. 01/30/20   [provider]  Vitamin A 2250 MCG (7500 UT) CAPS Take 1 capsule by mouth daily.     [provider]  vitamin E 400 UNIT capsule Take 400 Units by mouth daily.    [provider]  Zoledronic Acid (RECLAST IV) Inject 1 Dose into the vein as directed. ONCE A YEAR    [provider]  prochlorperazine (COMPAZINE) 10 MG tablet TAKE 1 TABLET(10 MG) BY MOUTH EVERY 6 HOURS AS NEEDED FOR NAUSEA OR VOMITING 01/06/19 03/22/19  Sindy Guadeloupe, MD    Family History Family History  Problem Relation Age of Onset  . Heart disease Mother        s/p stent  . Hypertension Mother   . Hypercholesterolemia Mother   . Diabetes Father   . Stomach cancer Other        uncle  . Breast cancer Cousin   . Breast cancer Paternal Aunt     Social History Social History   Tobacco Use  . Smoking status: Former Smoker    Quit date: 06/13/2007    Years since quitting: 12.7  . Smokeless tobacco: Never Used  Vaping Use  . Vaping Use: Never used  Substance Use Topics  . Alcohol use: No    Alcohol/week: 0.0 standard drinks  . Drug use: No     Allergies   Aspirin, Celebrex [celecoxib], Morphine and related, Adhesive [tape], Clarithromycin, Codeine, Darvon [propoxyphene hcl], Demerol [meperidine], Flonase [fluticasone propionate], Simvastatin, and Talwin  [pentazocine]   Review of Systems Review of Systems  Constitutional: Negative for activity change and appetite change.  Skin: Positive for color change and rash.  Hematological: Negative.   Psychiatric/Behavioral: Negative.      Physical Exam Triage Vital Signs ED Triage Vitals  Enc Vitals Group     BP 03/24/20 1250 125/75     Pulse Rate 03/24/20 1250 68     Resp 03/24/20 1250 18     Temp 03/24/20 1250 97.9 F (36.6 C)     Temp Source 03/24/20 1250 Oral     SpO2 03/24/20 1250 97 %     Weight 03/24/20 1249 161 lb 2.5 oz (73.1 kg)     Height 03/24/20 1249 5\' 2"  (1.575 m)     Head Circumference --      Peak Flow --      Pain Score 03/24/20 1249 8     Pain Loc --      Pain Edu? --      Excl. in [place]? --    No data found.  Updated Vital Signs BP 125/75 (BP Location: Right Arm)   Pulse 68   Temp 97.9 F (36.6 C) (Oral)   Resp 18   Ht 5\' 2"  (1.575 m)   Wt 161 lb 2.5 oz (73.1 kg)   SpO2 97%   BMI 29.48 kg/m   Visual Acuity Right Eye Distance:   Left Eye Distance:   Bilateral Distance:    Right Eye Near:   Left Eye Near:    Bilateral Near:     Physical Exam Vitals and nursing note reviewed.  Constitutional:      General: She is not in acute distress.    Appearance: Normal appearance. She is normal weight. She is not toxic-appearing.  Skin:    General: Skin is warm and dry.  Capillary Refill: Capillary refill takes less than 2 seconds.     Findings: Lesion and rash present.  Neurological:     General: No focal deficit present.     Mental Status: She is alert and oriented to person, place, and time.  Psychiatric:        Mood and Affect: Mood normal.        Behavior: Behavior normal.        Thought Content: Thought content normal.        Judgment: Judgment normal.      UC Treatments / Results  Labs (all labs ordered are listed, but only abnormal results are displayed) Labs Reviewed - No data to display  EKG   Radiology No results  found.  Procedures Procedures (including critical care time)  Medications Ordered in UC Medications - No data to display  Initial Impression / Assessment and Plan / UC Course  I have reviewed the triage vital signs and the nursing notes.  Pertinent labs & imaging results that were available during my care of the patient were reviewed by me and considered in my medical decision making (see chart for details).   Patient is here for evaluation of what she describes as a large white blister and small white bumps in her right groin that have been there for a number of weeks.  Patient reports that a month ago she does not remember seeing anything there but then about 3 weeks ago these bumps appeared and they have been getting worse.  Physical exam reveals a large oval nevi that is approximately 2 cm x 1 cm, raised, and with a rough texture.  The border does not seem irregular.  Extending medially from the medial aspect of this large flesh-colored lesion or some hard white bumps that are shiny and look like vesicles but are not fluid-filled.  There is no erythema or induration appreciated on exam.  Patient has a relationship with a dermatologist and I am recommending that she follow-up with dermatology.  We also discussed that with these bumps laying in her inguinal crease along where the leg elastic from her pain is lysed that they could be coming from irritation when she is cycling.  Will treat with topical triamcinolone twice daily to decrease inflammation and have her follow-up with dermatology.   Final Clinical Impressions(s) / UC Diagnoses   Final diagnoses:  None   Discharge Instructions   None    ED Prescriptions    None     PDMP not reviewed this encounter.   Margarette Canada, NP 03/24/20 1330

## 2020-03-24 NOTE — Discharge Instructions (Addendum)
Get an appointment to follow-up with the dermatologist that your husband sees for evaluation of this lesion in your right groin.  Apply the triamcinolone ointment twice daily to see if he can decrease the burning and irritation.

## 2020-03-24 NOTE — ED Triage Notes (Signed)
Pt reports she has an area of some bumps which burn on the groin area. Whitish in color.  Has been treated for a fungal infection on left side before but unsure if this is the same thing. States it is near an old scar.

## 2020-03-31 NOTE — Progress Notes (Signed)
Hematology/Oncology Consult note [facility]  [phone] [phone]  Patient Care Team: [name], MD as PCP - General (Family Medicine) Telford Nab, RN as Registered Nurse   Name of the patient: Jennifer Hodges  [id number]  [birth year]   Date of visit: 03/31/20  Diagnosis- Adenocarcinoma of the lung stage III T1b N2 M0  Chief complaint/ Reason for visit-on treatment assessment prior to next cycle of maintenance durvalumab  Heme/Onc history: patient is a 75 year old female who was seen by pulmonary and ENT for ongoing symptoms of bronchitis and possible laryngitis and received antibiotics for the same.She underwent CT chest in September 2020 which showed multiple bilateral lung nodules with associated mediastinal adenopathy and possible primary left upper lobe lung mass. This was followed by a PET CT scan which showed a left upper lobe lung mass measuring 1.9 x 1.2 cm with an SUV of 10.7. Conglomerate AP window adenopathy measuring 5.8 x 2.1 cm with an SUV of 14. She was also noted to have hypermetabolic left hilar adenopathy and left supraclavicular 0.7 cm lymph node with an SUV of 4.6. Noted to have a 7 mm right lower lobe lung nodule with faint metabolic activity. No evidence of other distant metastatic disease. This was followed by a CT super D chest without contrast. The right lower lobe lung nodules were somewhat larger as compared to September 2020. 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 Maintenance durvalumab started in January 2021after scan showed partial response.  Interval history-patient reports doing well presently her productive cough has decreased significantly.  Vertigo is stable  ECOG PS- 1 Pain scale- 0 Opioid associated constipation- no  Review of systems- Review of Systems  Constitutional: Positive for malaise/fatigue. Negative for chills, fever and weight loss.  HENT: Negative for congestion, ear discharge and nosebleeds.   Eyes: Negative for blurred vision.  Respiratory: Positive for cough. Negative for hemoptysis, sputum production, shortness of breath and wheezing.   Cardiovascular: Negative for chest pain, palpitations, orthopnea and claudication.  Gastrointestinal: Negative for abdominal pain, blood in stool, constipation, diarrhea, heartburn, melena, nausea and vomiting.  Genitourinary: Negative for dysuria, flank pain, frequency, hematuria and urgency.  Musculoskeletal: Negative for back pain, joint pain and myalgias.  Skin: Negative for rash.  Neurological: Negative for dizziness, tingling, focal weakness, seizures, weakness and headaches.  Endo/Heme/Allergies: Does not bruise/bleed easily.  Psychiatric/Behavioral: Negative for depression and suicidal ideas. The patient does not have insomnia.       Allergies  Allergen Reactions  . Aspirin Hives and Other (See Comments)    Difficulty breathing  . Celebrex [Celecoxib] Shortness Of Breath  . Morphine And Related Nausea And Vomiting and Swelling  . Adhesive [Tape] Other (See Comments)    Took top layer of skin off when removed.  . Clarithromycin Nausea And Vomiting  . Codeine Nausea And Vomiting  . Darvon [Propoxyphene Hcl] Nausea And Vomiting  . Demerol [Meperidine] Nausea And Vomiting  . Flonase [Fluticasone Propionate] Other (See Comments)    Fungal infection  . Simvastatin Other (See Comments)    "caused ulcers in mouth, and fever"  . Talwin  [Pentazocine]  Nausea And Vomiting     Past Medical History:  Diagnosis Date  . Anemia   . Asthma    No Inhalers--Dr. [name] will order as needed  . Bronchiectasis (HCC)    mild  . Chronic headaches     followed by [facility] migraines  . COPD (chronic obstructive pulmonary disease) ([place])   . DDD (degenerative disc disease), lumbar   . Diabetes mellitus without complication [facility])    "doctor says I no longer have diabetes"    . Diverticulosis   . Family history of adverse reaction to anesthesia    PONV  . Gall stones    history of  . GERD (gastroesophageal reflux disease)    EGD 8/09- non bleeding erosive gastritis, documentd esophageal ulcerations.   . Hiatal hernia   . History of pneumonia   . Hypercholesterolemia   . IBS (irritable bowel syndrome)   . Malignant neoplasm of upper lobe of left lung ([place]) 12/27/2018  . Meniere disease   . Murmur   . Osteoarthritis    lumbar disc disease, left hip  . Personal history of chemotherapy   . Personal history of radiation therapy   . PONV (postoperative nausea and vomiting)    "Only with last hip and I believe it was due to the morphine"   . Sleep apnea    cpap asked to bring mask and tubing  . Vertigo   . Weakness of right side of body      Past Surgical History:  Procedure Laterality Date  . ABDOMINAL HYSTERECTOMY  age 70  . ANTERIOR CERVICAL DECOMP/DISCECTOMY FUSION N/A 02/24/2015   Procedure: CERVICAL FOUR-FIVE, CERVICAL FIVE-SIX, CERVICAL SIX-SEVEN ANTERIOR CERVICAL DECOMPRESSION/DISCECTOMY FUSION ;  Surgeon: [name], MD;  Location: [facility];  Service: Neurosurgery;  Laterality: N/A;  C45 C56 C67 anterior cervical decompression with fusion interbody prosthesis plating and bonegraft  . APPENDECTOMY    . BACK SURGERY     4th lumbar fusion  . BLADDER SURGERY N/A    with vaginal wall repair  . BREAST CYST ASPIRATION Bilateral    neg  . BREAST SURGERY Bilateral    cyst removed and reduction  .  CARDIAC CATHETERIZATION  2014  . CARPAL TUNNEL RELEASE Right 02/11/2016   Procedure: CARPAL TUNNEL RELEASE;  Surgeon: [name], MD;  Location: [facility];  Service: Orthopedics;  Laterality: Right;  . CATARACT EXTRACTION W/ INTRAOCULAR LENS IMPLANT Bilateral 2015  . CHOLECYSTECTOMY    . EUS N/A 05/31/2012   Procedure: UPPER ENDOSCOPIC ULTRASOUND (EUS) LINEAR;  Surgeon: [name], MD;  Location: [facility];  Service: Endoscopy;  Laterality: N/A;  . EXCISIONAL HEMORRHOIDECTOMY    . JOINT REPLACEMENT Bilateral   . KNEE ARTHROSCOPY WITH LATERAL MENISECTOMY Right 07/07/2015   Procedure: KNEE ARTHROSCOPY WITH LATERAL MENISECTOMY, PARTIAL SYNOVECTOMY;  Surgeon: [name], MD;  Location: [facility];  Service: Orthopedics;  Laterality: Right;  . LUMBAR LAMINECTOMY    . PORTA CATH INSERTION N/A 01/07/2019   Procedure: PORTA CATH INSERTION;  Surgeon: [name], MD;  Location: [facility];  Service: Cardiovascular;  Laterality: N/A;  . REDUCTION MAMMAPLASTY  1990  . RIGHT OOPHORECTOMY    . TOTAL HIP ARTHROPLASTY Left 05/01/2014   Dr. [name]  . TOTAL HIP ARTHROPLASTY Right 08/05/2014   Procedure: TOTAL HIP ARTHROPLASTY ANTERIOR APPROACH;  Surgeon: [name], MD;  Location: [facility];  Service: Orthopedics;  Laterality: Right;  . ULNAR NERVE TRANSPOSITION Right 02/11/2016   Procedure: ULNAR NERVE  DECOMPRESSION/TRANSPOSITION;  Surgeon: [name], MD;  Location: [facility];  Service: Orthopedics;  Laterality: Right;  Marland Kitchen VIDEO BRONCHOSCOPY WITH ENDOBRONCHIAL ULTRASOUND Left 12/19/2018   Procedure: VIDEO BRONCHOSCOPY WITH ENDOBRONCHIAL ULTRASOUND, LEFT, SLEEP APNEA;  Surgeon: [name], MD;  Location: [facility];  Service: Thoracic;  Laterality: Left;    Social History   Socioeconomic History  . Marital status: Married    Spouse name: Not on file  . Number of children: Not on file  . Years of education: Not on file  . Highest education level: Not on file  Occupational  History  . Not on file  Tobacco Use  . Smoking status: Former Smoker    Quit date: 06/13/2007    Years since quitting: 12.8  . Smokeless tobacco: Never Used  Vaping Use  . Vaping Use: Never used  Substance and Sexual Activity  . Alcohol use: No    Alcohol/week: 0.0 standard drinks  . Drug use: No  . Sexual activity: Never  Other Topics Concern  . Not on file  Social History Narrative  . Not on file   Social Determinants of Health   Financial Resource Strain: Not on file  Food Insecurity: Not on file  Transportation Needs: Not on file  Physical Activity: Not on file  Stress: Not on file  Social Connections: Not on file  Intimate Partner Violence: Not on file    Family History  Problem Relation Age of Onset  . Heart disease Mother        s/p stent  . Hypertension Mother   . Hypercholesterolemia Mother   . Diabetes Father   . Stomach cancer Other        uncle  . Breast cancer Cousin   . Breast cancer Paternal Aunt      Current Outpatient Medications:  .  albuterol (PROVENTIL) (2.5 MG/3ML) 0.083% nebulizer solution, Take 2.5 mg by nebulization every 6 (six) hours as needed for wheezing., Disp: , Rfl:  .  albuterol (VENTOLIN HFA) 108 (90 Base) MCG/ACT inhaler, Inhale 2 puffs into the lungs every 6 (six) hours as needed for wheezing or shortness of breath., Disp: 18 g, Rfl: 0 .  Bioflavonoid Products (ESTER C PO), Take 1 tablet by mouth 3 (three) times daily., Disp: , Rfl:  .  Calcium-Magnesium-Zinc (CAL-MAG-ZINC PO), Take 1 tablet by mouth daily., Disp: , Rfl:  .  cetirizine (ZYRTEC) 5 MG tablet, Take 1 tablet (5 mg total) by mouth daily. (Patient taking differently: Take 5 mg by mouth daily as needed (runny nose.).), Disp: 30 tablet, Rfl: 0 .  CHLORASEPTIC MAX SORE THROAT 1.5-33 % LIQD, Use as directed 1 spray in the mouth or throat as needed (throat irritation.). , Disp: , Rfl:  .  Cholecalciferol (EQL VITAMIN D3) 25 MCG (1000 UT) capsule, Take 4,000 Units by mouth  daily., Disp: , Rfl:  .  Clotrimazole 1 % OINT, Apply 1 application topically in the morning, at noon, and at bedtime., Disp: 56.7 g, Rfl: 0 .  Coenzyme Q10 (CO Q 10) 100 MG CAPS, Take 100 mg by mouth daily. , Disp: , Rfl:  .  Cyanocobalamin (VITAMIN B-12) 2500 MCG SUBL, Place 2,500 mcg under the tongue daily., Disp: , Rfl:  .  cyclobenzaprine (FLEXERIL) 10 MG tablet, Take 10 mg by mouth at bedtime., Disp: , Rfl:  .  DHEA 25 MG CAPS, Take 25 mg by mouth daily., Disp: , Rfl:  .  Ensure (ENSURE), Take 237 mLs by mouth 2 (two) times daily  between meals., Disp: , Rfl:  .  ezetimibe (ZETIA) 10 MG tablet, Take 5 mg by mouth in the morning and at bedtime. , Disp: , Rfl:  .  folic acid (FOLVITE) 878 MCG tablet, Take 400 mcg by mouth 2 (two) times daily. , Disp: , Rfl:  .  Garlic 6767 MG CAPS, Take 1,000 mg by mouth daily., Disp: , Rfl:  .  Histamine Dihydrochloride (AUSTRALIAN DREAM ARTHRITIS EX), Apply 1 application topically 4 (four) times daily as needed (pain.)., Disp: , Rfl:  .  HYDROcodone-acetaminophen (NORCO/VICODIN) 5-325 MG tablet, 1-2 tabs po bid prn (Patient taking differently: Take 1 tablet by mouth at bedtime.), Disp: 10 tablet, Rfl: 0 .  HYDROcodone-homatropine (HYCODAN) 5-1.5 MG/5ML syrup, Take 5 mLs by mouth 3 (three) times daily as needed for cough., Disp: 120 mL, Rfl: 0 .  lidocaine-prilocaine (EMLA) cream, lidocaine-prilocaine 2.5 %-2.5 % topical cream, Disp: , Rfl:  .  meclizine (ANTIVERT) 25 MG tablet, Take 1 tablet (25 mg total) by mouth every 6 (six) hours as needed for dizziness., Disp: 30 tablet, Rfl: 0 .  montelukast (SINGULAIR) 10 MG tablet, Take 10 mg by mouth at bedtime. , Disp: , Rfl:  .  ondansetron (ZOFRAN-ODT) 4 MG disintegrating tablet, Take 4 mg by mouth every 8 (eight) hours as needed for nausea/vomiting., Disp: , Rfl:  .  Polyethyl Glycol-Propyl Glycol (SYSTANE) 0.4-0.3 % SOLN, Place 1 drop into both eyes 5 (five) times daily as needed (dry/irritated eyes). , Disp: ,  Rfl:  .  pyridOXINE (VITAMIN B-6) 100 MG tablet, Take 100 mg by mouth daily., Disp: , Rfl:  .  Selenium 200 MCG CAPS, Take 200 mcg by mouth daily., Disp: , Rfl:  .  sucralfate (CARAFATE) 1 g tablet, 1 tablet 3 (three) times daily as needed. , Disp: , Rfl:  .  triamcinolone (KENALOG) 0.1 %, Apply 1 application topically 2 (two) times daily., Disp: 30 g, Rfl: 0 .  UBRELVY 100 MG TABS, Take by mouth., Disp: , Rfl:  .  Vitamin A 2250 MCG (7500 UT) CAPS, Take 1 capsule by mouth daily. , Disp: , Rfl:  .  vitamin E 400 UNIT capsule, Take 400 Units by mouth daily., Disp: , Rfl:  .  Zoledronic Acid (RECLAST IV), Inject 1 Dose into the vein as directed. ONCE A YEAR, Disp: , Rfl:   Physical exam:  Vitals:   04/02/20 0905  BP: 123/63  Pulse: 74  Resp: 18  Temp: (!) 96.9 F (36.1 C)  TempSrc: Tympanic  SpO2: 99%  Weight: 162 lb 4.8 oz (73.6 kg)   Physical Exam HENT:     Head: Normocephalic and atraumatic.  Eyes:     Extraocular Movements: EOM normal.     Pupils: Pupils are equal, round, and reactive to light.  Cardiovascular:     Rate and Rhythm: Normal rate and regular rhythm.     Heart sounds: Normal heart sounds.  Pulmonary:     Effort: Pulmonary effort is normal.     Breath sounds: Normal breath sounds.  Abdominal:     General: Bowel sounds are normal.     Palpations: Abdomen is soft.  Musculoskeletal:     Cervical back: Normal range of motion.  Skin:    General: Skin is warm and dry.  Neurological:     Mental Status: She is alert and oriented to person, place, and time.      CMP Latest Ref Rng & Units 04/02/2020  Glucose 70 - 99 mg/dL 99  BUN 8 - 23 mg/dL 16  Creatinine 0.44 - 1.00 mg/dL 0.55  Sodium 135 - 145 mmol/L 138  Potassium 3.5 - 5.1 mmol/L 4.1  Chloride 98 - 111 mmol/L 104  CO2 22 - 32 mmol/L 28  Calcium 8.9 - 10.3 mg/dL 9.1  Total Protein 6.5 - 8.1 g/dL 6.5  Total Bilirubin 0.3 - 1.2 mg/dL 0.5  Alkaline Phos 38 - 126 U/L 58  AST 15 - 41 U/L 55(H)  ALT 0 -  44 U/L 117(H)   CBC Latest Ref Rng & Units 04/02/2020  WBC 4.0 - 10.5 K/uL 3.7(L)  Hemoglobin 12.0 - 15.0 g/dL 12.5  Hematocrit 36.0 - 46.0 % 36.9  Platelets 150 - 400 K/uL 204     Assessment and plan- Patient is a 75 y.o. female with stage III adenocarcinoma of the lung.    She is here for on treatment assessment prior to next cycle of maintenance durvalumab  Counts okay to proceed with cycle 24 of maintenance durvalumab today.  CT chest abdomen pelvis with contrast has been scheduled for tomorrow.  I will see her back in 2 weeks for cycle 25.  Plan is to complete 26 cycles of there is no evidence of progression followed by surveillance imaging every 3 months.  Chronic mild leukopenia : Continue to monitor   Visit Diagnosis 1. Malignant neoplasm of upper lobe of left lung ([place])   2. Encounter for antineoplastic immunotherapy   3. Leukopenia, unspecified type      Dr. Randa Evens, MD, MPH [facility] at [facility] [phone] 03/31/2020 2:53 PM

## 2020-04-01 ENCOUNTER — Other Ambulatory Visit: Payer: Self-pay | Admitting: *Deleted

## 2020-04-01 DIAGNOSIS — C3412 Malignant neoplasm of upper lobe, left bronchus or lung: Secondary | ICD-10-CM

## 2020-04-02 ENCOUNTER — Encounter: Payer: Self-pay | Admitting: Oncology

## 2020-04-02 ENCOUNTER — Inpatient Hospital Stay: Payer: Medicare Other

## 2020-04-02 ENCOUNTER — Inpatient Hospital Stay (HOSPITAL_BASED_OUTPATIENT_CLINIC_OR_DEPARTMENT_OTHER): Payer: Medicare Other | Admitting: Oncology

## 2020-04-02 VITALS — BP 123/63 | HR 74 | Temp 96.9°F | Resp 18 | Wt 162.3 lb

## 2020-04-02 DIAGNOSIS — D72819 Decreased white blood cell count, unspecified: Secondary | ICD-10-CM | POA: Diagnosis not present

## 2020-04-02 DIAGNOSIS — C3412 Malignant neoplasm of upper lobe, left bronchus or lung: Secondary | ICD-10-CM

## 2020-04-02 DIAGNOSIS — Z5112 Encounter for antineoplastic immunotherapy: Secondary | ICD-10-CM | POA: Diagnosis not present

## 2020-04-02 LAB — CBC WITH DIFFERENTIAL/PLATELET
Abs Immature Granulocytes: 0.01 10*3/uL (ref 0.00–0.07)
Basophils Absolute: 0 10*3/uL (ref 0.0–0.1)
Basophils Relative: 1 %
Eosinophils Absolute: 0.2 10*3/uL (ref 0.0–0.5)
Eosinophils Relative: 4 %
HCT: 36.9 % (ref 36.0–46.0)
Hemoglobin: 12.5 g/dL (ref 12.0–15.0)
Immature Granulocytes: 0 %
Lymphocytes Relative: 25 %
Lymphs Abs: 0.9 10*3/uL (ref 0.7–4.0)
MCH: 30.6 pg (ref 26.0–34.0)
MCHC: 33.9 g/dL (ref 30.0–36.0)
MCV: 90.2 fL (ref 80.0–100.0)
Monocytes Absolute: 0.4 10*3/uL (ref 0.1–1.0)
Monocytes Relative: 9 %
Neutro Abs: 2.3 10*3/uL (ref 1.7–7.7)
Neutrophils Relative %: 61 %
Platelets: 204 10*3/uL (ref 150–400)
RBC: 4.09 MIL/uL (ref 3.87–5.11)
RDW: 14 % (ref 11.5–15.5)
WBC: 3.7 10*3/uL — ABNORMAL LOW (ref 4.0–10.5)
nRBC: 0 % (ref 0.0–0.2)

## 2020-04-02 LAB — COMPREHENSIVE METABOLIC PANEL
ALT: 117 U/L — ABNORMAL HIGH (ref 0–44)
AST: 55 U/L — ABNORMAL HIGH (ref 15–41)
Albumin: 4.2 g/dL (ref 3.5–5.0)
Alkaline Phosphatase: 58 U/L (ref 38–126)
Anion gap: 6 (ref 5–15)
BUN: 16 mg/dL (ref 8–23)
CO2: 28 mmol/L (ref 22–32)
Calcium: 9.1 mg/dL (ref 8.9–10.3)
Chloride: 104 mmol/L (ref 98–111)
Creatinine, Ser: 0.55 mg/dL (ref 0.44–1.00)
GFR, Estimated: >60 mL/min (ref >60)
Glucose, Bld: 99 mg/dL (ref 70–99)
Potassium: 4.1 mmol/L (ref 3.5–5.1)
Sodium: 138 mmol/L (ref 135–145)
Total Bilirubin: 0.5 mg/dL (ref 0.3–1.2)
Total Protein: 6.5 g/dL (ref 6.5–8.1)

## 2020-04-02 MED ORDER — HEPARIN SOD (PORK) LOCK FLUSH 100 UNIT/ML IV SOLN
500.0000 [IU] | Freq: Once | INTRAVENOUS | Status: AC | PRN
  Administered 2020-04-02: 500 [IU]
  Filled 2020-04-02: qty 5

## 2020-04-02 MED ORDER — SODIUM CHLORIDE 0.9 % IV SOLN
10.0000 mg/kg | Freq: Once | INTRAVENOUS | Status: AC
Start: 2020-04-02 — End: 2020-04-02
  Administered 2020-04-02: 740 mg via INTRAVENOUS
  Filled 2020-04-02: qty 10

## 2020-04-02 MED ORDER — SODIUM CHLORIDE 0.9 % IV SOLN
Freq: Once | INTRAVENOUS | Status: AC
  Filled 2020-04-02: qty 250

## 2020-04-02 NOTE — Progress Notes (Signed)
Pt tolerated treatment well with no signs of complications. VSS. Pt stable for discharge.   Lenya Sterne [employer]

## 2020-04-02 NOTE — Progress Notes (Signed)
5868- ALT: 117. MD, Dr. Janese Banks, notified and aware. Per MD order: proceed with scheduled Imfinzi treatment today.

## 2020-04-03 ENCOUNTER — Other Ambulatory Visit: Payer: Self-pay

## 2020-04-03 ENCOUNTER — Ambulatory Visit
Admission: RE | Admit: 2020-04-03 | Discharge: 2020-04-03 | Disposition: A | Discharge status: Home / Self Care | Payer: Medicare Other | Source: Ambulatory Visit | Attending: Oncology | Admitting: Oncology

## 2020-04-03 DIAGNOSIS — C3412 Malignant neoplasm of upper lobe, left bronchus or lung: Secondary | ICD-10-CM | POA: Diagnosis present

## 2020-04-03 DIAGNOSIS — Z5112 Encounter for antineoplastic immunotherapy: Secondary | ICD-10-CM | POA: Insufficient documentation

## 2020-04-03 MED ORDER — IOHEXOL 300 MG/ML  SOLN
100.0000 mL | Freq: Once | INTRAMUSCULAR | Status: AC | PRN
  Administered 2020-04-03: 100 mL via INTRAVENOUS

## 2020-04-16 ENCOUNTER — Inpatient Hospital Stay (HOSPITAL_BASED_OUTPATIENT_CLINIC_OR_DEPARTMENT_OTHER): Payer: Medicare Other | Admitting: Oncology

## 2020-04-16 ENCOUNTER — Encounter: Payer: Self-pay | Admitting: Oncology

## 2020-04-16 ENCOUNTER — Inpatient Hospital Stay: Payer: Medicare Other

## 2020-04-16 ENCOUNTER — Inpatient Hospital Stay: Payer: Medicare Other | Attending: Oncology

## 2020-04-16 ENCOUNTER — Other Ambulatory Visit: Payer: Self-pay

## 2020-04-16 VITALS — BP 127/63 | HR 70 | Temp 96.7°F | Resp 18 | Wt 166.9 lb

## 2020-04-16 DIAGNOSIS — D510 Vitamin B12 deficiency anemia due to intrinsic factor deficiency: Secondary | ICD-10-CM

## 2020-04-16 DIAGNOSIS — Z79899 Other long term (current) drug therapy: Secondary | ICD-10-CM | POA: Insufficient documentation

## 2020-04-16 DIAGNOSIS — C3412 Malignant neoplasm of upper lobe, left bronchus or lung: Secondary | ICD-10-CM

## 2020-04-16 DIAGNOSIS — Z5112 Encounter for antineoplastic immunotherapy: Secondary | ICD-10-CM | POA: Diagnosis present

## 2020-04-16 LAB — CBC WITH DIFFERENTIAL/PLATELET
Abs Immature Granulocytes: 0.01 10*3/uL (ref 0.00–0.07)
Basophils Absolute: 0 10*3/uL (ref 0.0–0.1)
Basophils Relative: 1 %
Eosinophils Absolute: 0.1 10*3/uL (ref 0.0–0.5)
Eosinophils Relative: 4 %
HCT: 35.6 % — ABNORMAL LOW (ref 36.0–46.0)
Hemoglobin: 12.2 g/dL (ref 12.0–15.0)
Immature Granulocytes: 0 %
Lymphocytes Relative: 23 %
Lymphs Abs: 0.8 10*3/uL (ref 0.7–4.0)
MCH: 30.8 pg (ref 26.0–34.0)
MCHC: 34.3 g/dL (ref 30.0–36.0)
MCV: 89.9 fL (ref 80.0–100.0)
Monocytes Absolute: 0.4 10*3/uL (ref 0.1–1.0)
Monocytes Relative: 10 %
Neutro Abs: 2.2 10*3/uL (ref 1.7–7.7)
Neutrophils Relative %: 62 %
Platelets: 205 10*3/uL (ref 150–400)
RBC: 3.96 MIL/uL (ref 3.87–5.11)
RDW: 13.7 % (ref 11.5–15.5)
WBC: 3.6 10*3/uL — ABNORMAL LOW (ref 4.0–10.5)
nRBC: 0 % (ref 0.0–0.2)

## 2020-04-16 LAB — COMPREHENSIVE METABOLIC PANEL
ALT: 21 U/L (ref 0–44)
AST: 23 U/L (ref 15–41)
Albumin: 4 g/dL (ref 3.5–5.0)
Alkaline Phosphatase: 58 U/L (ref 38–126)
Anion gap: 8 (ref 5–15)
BUN: 13 mg/dL (ref 8–23)
CO2: 28 mmol/L (ref 22–32)
Calcium: 9.3 mg/dL (ref 8.9–10.3)
Chloride: 103 mmol/L (ref 98–111)
Creatinine, Ser: 0.47 mg/dL (ref 0.44–1.00)
GFR, Estimated: >60 mL/min (ref >60)
Glucose, Bld: 94 mg/dL (ref 70–99)
Potassium: 4.1 mmol/L (ref 3.5–5.1)
Sodium: 139 mmol/L (ref 135–145)
Total Bilirubin: 0.7 mg/dL (ref 0.3–1.2)
Total Protein: 6.5 g/dL (ref 6.5–8.1)

## 2020-04-16 MED ORDER — HEPARIN SOD (PORK) LOCK FLUSH 100 UNIT/ML IV SOLN
INTRAVENOUS | Status: AC
  Filled 2020-04-16: qty 5

## 2020-04-16 MED ORDER — SODIUM CHLORIDE 0.9 % IV SOLN
Freq: Once | INTRAVENOUS | Status: AC
  Filled 2020-04-16: qty 250

## 2020-04-16 MED ORDER — SODIUM CHLORIDE 0.9 % IV SOLN
10.0000 mg/kg | Freq: Once | INTRAVENOUS | Status: AC
  Administered 2020-04-16: 740 mg via INTRAVENOUS
  Filled 2020-04-16: qty 10

## 2020-04-16 MED ORDER — HEPARIN SOD (PORK) LOCK FLUSH 100 UNIT/ML IV SOLN
500.0000 [IU] | Freq: Once | INTRAVENOUS | Status: AC | PRN
  Administered 2020-04-16: 500 [IU]
  Filled 2020-04-16: qty 5

## 2020-04-16 NOTE — Progress Notes (Signed)
Pt tolerated infusion well with no signs of complications. VSS. Pt stable for discharge.   Jennifer Hodges  

## 2020-04-16 NOTE — Progress Notes (Signed)
Hematology/Oncology Consult note [facility]  Telephone:([phone] [phone]  Patient Care Team: [name], MD as PCP - General (Family Medicine) Telford Nab, RN as Registered Nurse   Name of the patient: Jennifer Hodges  [id number]  [birth year]   Date of visit: 04/16/20  Diagnosis- Adenocarcinoma of the lung stage III T1b N2 M0  Chief complaint/ Reason for visit-on treatment assessment prior to next cycle of maintenance durvalumab  Heme/Onc history: patient is a 75 year old female who was seen by pulmonary and ENT for ongoing symptoms of bronchitis and possible laryngitis and received antibiotics for the same.She underwent CT chest in September 2020 which showed multiple bilateral lung nodules with associated mediastinal adenopathy and possible primary left upper lobe lung mass. This was followed by a PET CT scan which showed a left upper lobe lung mass measuring 1.9 x 1.2 cm with an SUV of 10.7. Conglomerate AP window adenopathy measuring 5.8 x 2.1 cm with an SUV of 14. She was also noted to have hypermetabolic left hilar adenopathy and left supraclavicular 0.7 cm lymph node with an SUV of 4.6. Noted to have a 7 mm right lower lobe lung nodule with faint metabolic activity. No evidence of other distant metastatic disease. This was followed by a CT super D chest without contrast. The right lower lobe lung nodules were somewhat larger as compared to September 2020. 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 Maintenance durvalumab started in January 2021after scan showed partial response.  Interval history-patient feels well today and denies any complaints at this time.  She has baseline vertigo from BPPV which is stable.  She has occasional cough which waxes and wanes and is presently stable  ECOG PS- 1 Pain scale- 0   Review of systems- Review of Systems  Constitutional: Positive for malaise/fatigue. Negative for chills, fever and weight loss.  HENT: Negative for congestion, ear discharge and nosebleeds.   Eyes: Negative for blurred vision.  Respiratory: Positive for cough. Negative for hemoptysis, sputum production, shortness of breath and wheezing.   Cardiovascular: Negative for chest pain, palpitations, orthopnea and claudication.  Gastrointestinal: Negative for abdominal pain, blood in stool, constipation, diarrhea, heartburn, melena, nausea and vomiting.  Genitourinary: Negative for dysuria, flank pain, frequency, hematuria and urgency.  Musculoskeletal: Negative for back pain, joint pain and myalgias.  Skin: Negative for rash.  Neurological: Negative for dizziness, tingling, focal weakness, seizures, weakness and headaches.  Endo/Heme/Allergies: Does not bruise/bleed easily.  Psychiatric/Behavioral: Negative for depression and suicidal ideas. The patient does not have insomnia.       Allergies  Allergen Reactions  . Aspirin Hives and Other (See Comments)    Difficulty breathing  . Celebrex [Celecoxib] Shortness Of Breath  . Morphine And Related Nausea And Vomiting and Swelling  . Adhesive [Tape] Other (See Comments)    Took top layer of skin off when removed.  . Clarithromycin Nausea And Vomiting  . Codeine Nausea And Vomiting  . Darvon [Propoxyphene Hcl] Nausea And Vomiting  . Demerol [Meperidine] Nausea And Vomiting  . Flonase [Fluticasone Propionate] Other (See Comments)    Fungal infection  . Simvastatin Other (  See Comments)     "caused ulcers in mouth, and fever"  . Talwin [Pentazocine] Nausea And Vomiting     Past Medical History:  Diagnosis Date  . Anemia   . Asthma    No Inhalers--Dr. [name] will order as needed  . Bronchiectasis (HCC)    mild  . Chronic headaches     followed by [facility] migraines  . COPD (chronic obstructive pulmonary disease) ([place])   . DDD (degenerative disc disease), lumbar   . Diabetes mellitus without complication [facility])    "doctor says I no longer have diabetes"    . Diverticulosis   . Family history of adverse reaction to anesthesia    PONV  . Gall stones    history of  . GERD (gastroesophageal reflux disease)    EGD 8/09- non bleeding erosive gastritis, documentd esophageal ulcerations.   . Hiatal hernia   . History of pneumonia   . Hypercholesterolemia   . IBS (irritable bowel syndrome)   . Malignant neoplasm of upper lobe of left lung ([place]) 12/27/2018  . Meniere disease   . Murmur   . Osteoarthritis    lumbar disc disease, left hip  . Personal history of chemotherapy   . Personal history of radiation therapy   . PONV (postoperative nausea and vomiting)    "Only with last hip and I believe it was due to the morphine"   . Sleep apnea    cpap asked to bring mask and tubing  . Vertigo   . Weakness of right side of body      Past Surgical History:  Procedure Laterality Date  . ABDOMINAL HYSTERECTOMY  age 75  . ANTERIOR CERVICAL DECOMP/DISCECTOMY FUSION N/A 02/24/2015   Procedure: CERVICAL FOUR-FIVE, CERVICAL FIVE-SIX, CERVICAL SIX-SEVEN ANTERIOR CERVICAL DECOMPRESSION/DISCECTOMY FUSION ;  Surgeon: [name], MD;  Location: [place] [facility];  Service: Neurosurgery;  Laterality: N/A;  C45 C56 C67 anterior cervical decompression with fusion interbody prosthesis plating and bonegraft  . APPENDECTOMY    . BACK SURGERY     4th lumbar fusion  . BLADDER SURGERY N/A    with vaginal wall repair  . BREAST CYST ASPIRATION Bilateral    neg  . BREAST SURGERY  Bilateral    cyst removed and reduction  . CARDIAC CATHETERIZATION  2014  . CARPAL TUNNEL RELEASE Right 02/11/2016   Procedure: CARPAL TUNNEL RELEASE;  Surgeon: [name], MD;  Location: [facility] ORS;  Service: Orthopedics;  Laterality: Right;  . CATARACT EXTRACTION W/ INTRAOCULAR LENS IMPLANT Bilateral 2015  . CHOLECYSTECTOMY    . EUS N/A 05/31/2012   Procedure: UPPER ENDOSCOPIC ULTRASOUND (EUS) LINEAR;  Surgeon: [name], MD;  Location: [facility];  Service: Endoscopy;  Laterality: N/A;  . EXCISIONAL HEMORRHOIDECTOMY    . JOINT REPLACEMENT Bilateral   . KNEE ARTHROSCOPY WITH LATERAL MENISECTOMY Right 07/07/2015   Procedure: KNEE ARTHROSCOPY WITH LATERAL MENISECTOMY, PARTIAL SYNOVECTOMY;  Surgeon: [name], MD;  Location: [facility] ORS;  Service: Orthopedics;  Laterality: Right;  . LUMBAR LAMINECTOMY    . PORTA CATH INSERTION N/A 01/07/2019   Procedure: PORTA CATH INSERTION;  Surgeon: [name], MD;  Location: [place] CV LAB;  Service: Cardiovascular;  Laterality: N/A;  . REDUCTION MAMMAPLASTY  1990  . RIGHT OOPHORECTOMY    . TOTAL HIP ARTHROPLASTY Left 05/01/2014   Dr. [name]  . TOTAL HIP ARTHROPLASTY Right 08/05/2014   Procedure: TOTAL HIP ARTHROPLASTY ANTERIOR APPROACH;  Surgeon: [name], MD;  Location: [facility] ORS;  Service:  Orthopedics;  Laterality: Right;  . ULNAR NERVE TRANSPOSITION Right 02/11/2016   Procedure: ULNAR NERVE DECOMPRESSION/TRANSPOSITION;  Surgeon: [name], MD;  Location: [facility] ORS;  Service: Orthopedics;  Laterality: Right;  Marland Kitchen VIDEO BRONCHOSCOPY WITH ENDOBRONCHIAL ULTRASOUND Left 12/19/2018   Procedure: VIDEO BRONCHOSCOPY WITH ENDOBRONCHIAL ULTRASOUND, LEFT, SLEEP APNEA;  Surgeon: [name], MD;  Location: [facility] ORS;  Service: Thoracic;  Laterality: Left;    Social History   Socioeconomic History  . Marital status: Married    Spouse name: Not on file  . Number of children: Not on file  . Years of education: Not on file  . Highest  education level: Not on file  Occupational History  . Not on file  Tobacco Use  . Smoking status: Former Smoker    Quit date: 06/13/2007    Years since quitting: 12.8  . Smokeless tobacco: Never Used  Vaping Use  . Vaping Use: Never used  Substance and Sexual Activity  . Alcohol use: No    Alcohol/week: 0.0 standard drinks  . Drug use: No  . Sexual activity: Never  Other Topics Concern  . Not on file  Social History Narrative  . Not on file   Social Determinants of Health   Financial Resource Strain: Not on file  Food Insecurity: Not on file  Transportation Needs: Not on file  Physical Activity: Not on file  Stress: Not on file  Social Connections: Not on file  Intimate Partner Violence: Not on file    Family History  Problem Relation Age of Onset  . Heart disease Mother        s/p stent  . Hypertension Mother   . Hypercholesterolemia Mother   . Diabetes Father   . Stomach cancer Other        uncle  . Breast cancer Cousin   . Breast cancer Paternal Aunt      Current Outpatient Medications:  .  albuterol (PROVENTIL) (2.5 MG/3ML) 0.083% nebulizer solution, Take 2.5 mg by nebulization every 6 (six) hours as needed for wheezing., Disp: , Rfl:  .  albuterol (VENTOLIN HFA) 108 (90 Base) MCG/ACT inhaler, Inhale 2 puffs into the lungs every 6 (six) hours as needed for wheezing or shortness of breath., Disp: 18 g, Rfl: 0 .  Bioflavonoid Products (ESTER C PO), Take 1 tablet by mouth 3 (three) times daily., Disp: , Rfl:  .  Calcium-Magnesium-Zinc (CAL-MAG-ZINC PO), Take 1 tablet by mouth daily., Disp: , Rfl:  .  cetirizine (ZYRTEC) 5 MG tablet, Take 1 tablet (5 mg total) by mouth daily. (Patient taking differently: Take 5 mg by mouth daily as needed (runny nose.).), Disp: 30 tablet, Rfl: 0 .  CHLORASEPTIC MAX SORE THROAT 1.5-33 % LIQD, Use as directed 1 spray in the mouth or throat as needed (throat irritation.). , Disp: , Rfl:  .  Cholecalciferol (EQL VITAMIN D3) 25 MCG (1000  UT) capsule, Take 4,000 Units by mouth daily., Disp: , Rfl:  .  Clotrimazole 1 % OINT, Apply 1 application topically in the morning, at noon, and at bedtime., Disp: 56.7 g, Rfl: 0 .  Coenzyme Q10 (CO Q 10) 100 MG CAPS, Take 100 mg by mouth daily. , Disp: , Rfl:  .  Cyanocobalamin (VITAMIN B-12) 2500 MCG SUBL, Place 2,500 mcg under the tongue daily., Disp: , Rfl:  .  cyclobenzaprine (FLEXERIL) 10 MG tablet, Take 10 mg by mouth at bedtime., Disp: , Rfl:  .  DHEA 25 MG CAPS, Take 25 mg by mouth daily., Disp: ,  Rfl:  .  Ensure (ENSURE), Take 237 mLs by mouth 2 (two) times daily between meals., Disp: , Rfl:  .  ezetimibe (ZETIA) 10 MG tablet, Take 5 mg by mouth in the morning and at bedtime. , Disp: , Rfl:  .  folic acid (FOLVITE) 400 MCG tablet, Take 400 mcg by mouth 2 (two) times daily. , Disp: , Rfl:  .  Garlic 1000 MG CAPS, Take 1,000 mg by mouth daily., Disp: , Rfl:  .  guaiFENesin-dextromethorphan (ROBITUSSIN DM) 100-10 MG/5ML syrup, Take 5 mLs by mouth every 4 (four) hours as needed for cough., Disp: , Rfl:  .  Histamine Dihydrochloride (AUSTRALIAN DREAM ARTHRITIS EX), Apply 1 application topically 4 (four) times daily as needed (pain.)., Disp: , Rfl:  .  HYDROcodone-acetaminophen (NORCO/VICODIN) 5-325 MG tablet, 1-2 tabs po bid prn (Patient taking differently: Take 1 tablet by mouth at bedtime.), Disp: 10 tablet, Rfl: 0 .  HYDROcodone-homatropine (HYCODAN) 5-1.5 MG/5ML syrup, Take 5 mLs by mouth 3 (three) times daily as needed for cough., Disp: 120 mL, Rfl: 0 .  lidocaine-prilocaine (EMLA) cream, lidocaine-prilocaine 2.5 %-2.5 % topical cream, Disp: , Rfl:  .  meclizine (ANTIVERT) 25 MG tablet, Take 1 tablet (25 mg total) by mouth every 6 (six) hours as needed for dizziness., Disp: 30 tablet, Rfl: 0 .  montelukast (SINGULAIR) 10 MG tablet, Take 10 mg by mouth at bedtime. , Disp: , Rfl:  .  ondansetron (ZOFRAN-ODT) 4 MG disintegrating tablet, Take 4 mg by mouth every 8 (eight) hours as needed for  nausea/vomiting., Disp: , Rfl:  .  Polyethyl Glycol-Propyl Glycol (SYSTANE) 0.4-0.3 % SOLN, Place 1 drop into both eyes 5 (five) times daily as needed (dry/irritated eyes). , Disp: , Rfl:  .  pyridOXINE (VITAMIN B-6) 100 MG tablet, Take 100 mg by mouth daily., Disp: , Rfl:  .  Selenium 200 MCG CAPS, Take 200 mcg by mouth daily., Disp: , Rfl:  .  sucralfate (CARAFATE) 1 g tablet, 1 tablet 3 (three) times daily as needed. , Disp: , Rfl:  .  triamcinolone (KENALOG) 0.1 %, Apply 1 application topically 2 (two) times daily., Disp: 30 g, Rfl: 0 .  UBRELVY 100 MG TABS, Take by mouth., Disp: , Rfl:  .  Vitamin A 2250 MCG (7500 UT) CAPS, Take 1 capsule by mouth daily. , Disp: , Rfl:  .  vitamin E 400 UNIT capsule, Take 400 Units by mouth daily., Disp: , Rfl:  .  Zoledronic Acid (RECLAST IV), Inject 1 Dose into the vein as directed. ONCE A YEAR, Disp: , Rfl:   Physical exam:  Vitals:   04/16/20 0917  BP: 127/63  Pulse: 70  Resp: 18  Temp: (!) 96.7 F (35.9 C)  TempSrc: Tympanic  SpO2: 100%  Weight: 166 lb 14.4 oz (75.7 kg)   Physical Exam HENT:     Head: Normocephalic and atraumatic.  Eyes:     Extraocular Movements: EOM normal.     Pupils: Pupils are equal, round, and reactive to light.  Cardiovascular:     Rate and Rhythm: Normal rate and regular rhythm.     Heart sounds: Normal heart sounds.  Pulmonary:     Effort: Pulmonary effort is normal.     Breath sounds: Normal breath sounds.  Abdominal:     General: Bowel sounds are normal.     Palpations: Abdomen is soft.  Musculoskeletal:     Cervical back: Normal range of motion.  Skin:    General: Skin is warm  and dry.  Neurological:     Mental Status: She is alert and oriented to person, place, and time.      CMP Latest Ref Rng & Units 04/16/2020  Glucose 70 - 99 mg/dL 94  BUN 8 - 23 mg/dL 13  Creatinine 0.11 - 2.68 mg/dL 6.48  Sodium 598 - 488 mmol/L 139  Potassium 3.5 - 5.1 mmol/L 4.1  Chloride 98 - 111 mmol/L 103  CO2 22 -  32 mmol/L 28  Calcium 8.9 - 10.3 mg/dL 9.3  Total Protein 6.5 - 8.1 g/dL 6.5  Total Bilirubin 0.3 - 1.2 mg/dL 0.7  Alkaline Phos 38 - 126 U/L 58  AST 15 - 41 U/L 23  ALT 0 - 44 U/L 21   CBC Latest Ref Rng & Units 04/16/2020  WBC 4.0 - 10.5 K/uL 3.6(L)  Hemoglobin 12.0 - 15.0 g/dL 58.4  Hematocrit 18.6 - 46.0 % 35.6(L)  Platelets 150 - 400 K/uL 205    No images are attached to the encounter.  CT CHEST ABDOMEN PELVIS W CONTRAST  Result Date: 04/03/2020 CLINICAL DATA:  Stage III adenocarcinoma of the lung.  Restaging. EXAM: CT CHEST, ABDOMEN, AND PELVIS WITH CONTRAST TECHNIQUE: Multidetector CT imaging of the chest, abdomen and pelvis was performed following the standard protocol during bolus administration of intravenous contrast. CONTRAST:  OMNIPAQUE IOHEXOL 300 MG/ML  SOLN COMPARISON:  01/10/2020 FINDINGS: CT CHEST FINDINGS Cardiovascular: The heart size is normal. No substantial pericardial effusion. Coronary artery calcification is evident. Atherosclerotic calcification is noted in the wall of the thoracic aorta. Right Port-A-Cath tip is positioned in the distal SVC near the RA junction. Mediastinum/Nodes: No mediastinal lymphadenopathy. There is no hilar lymphadenopathy. The esophagus has normal imaging features. There is no axillary lymphadenopathy. Lungs/Pleura: Centrilobular emphsyema noted. Left parahilar post treatment scarring is similar to prior. 8 mm cluster of nodules in the right lower lobe peripherally on 95/3 is stable. 2 mm posterior right lower lobe nodule on 100/3 is new since prior. Calcified granuloma again noted in the lingula. No pleural effusion. Musculoskeletal: No worrisome lytic or sclerotic osseous abnormality. CT ABDOMEN PELVIS FINDINGS Hepatobiliary: No suspicious focal abnormality within the liver parenchyma. Mild intrahepatic biliary duct prominence is stable in this patient status post cholecystectomy. Common bile duct measures 9 mm diameter in the head of  pancreas similar to a 8 mm previously (remeasured). Pancreas: Pancreas divisum anatomy evident. Spleen: No splenomegaly. No focal mass lesion. Adrenals/Urinary Tract: No adrenal nodule or mass. Focal cortical scarring noted interpolar right kidney. Scattered bilateral tiny hypodensities in the kidneys are too small to characterize but likely benign. No evidence for hydroureter. Bladder obscured by beam hardening artifact from bilateral hip replacement. Stomach/Bowel: Stomach is unremarkable. No gastric wall thickening. No evidence of outlet obstruction. Duodenum is normally positioned as is the ligament of Treitz. No small bowel wall thickening. No small bowel dilatation. The terminal ileum is normal. The appendix is not visualized, but there is no edema or inflammation in the region of the cecum. No gross colonic mass. No colonic wall thickening. Diverticular changes are noted in the left colon without evidence of diverticulitis. Vascular/Lymphatic: There is abdominal aortic atherosclerosis without aneurysm. There is no gastrohepatic or hepatoduodenal ligament lymphadenopathy. No retroperitoneal or mesenteric lymphadenopathy. No pelvic sidewall lymphadenopathy. Reproductive: Uterus surgically absent.  There is no adnexal mass. Other: No intraperitoneal free fluid. Musculoskeletal: Pelvic floor laxity noted. Status post bilateral hip replacement. No worrisome lytic or sclerotic osseous abnormality. IMPRESSION: 1. Stable post treatment scarring left  parahilar lung. The 2 areas of concern in the radiation field on the prior study are stable. No new or progressive findings to suggest recurrent or metastatic disease in the chest, abdomen, or pelvis. 2. 2 mm posterior right lower lobe pulmonary nodule, new since prior. Attention on follow-up recommended. 3. Left colonic diverticulosis without diverticulitis. 4. Aortic Atherosclerosis (ICD10-I70.0) and Emphysema (ICD10-J43.9). Electronically Signed   By: Misty Stanley  M.D.   On: 04/03/2020 12:44     Assessment and plan- Patient is a 75 y.o. female with stage III adenocarcinoma of the lung. She is here for on treatment assessment prior to next cycle of maintenance durvalumab  Counts okay to proceed with next cycle of maintenance durvalumab today.  I will see her back in 2 weeks for cycle 26 which is her last cycle.  I have reviewed CT chest abdomen and pelvis images independently and discussed findings with the patient. 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  She was found to have a new right lower lobe 2 mm nodule which we will also monitor.  Presently there are no findings of recurrent or progressive disease.  Plan is to continue surveillance scans every 3 months after she completes durvalumab in 2 weeks   Visit Diagnosis 1. Encounter for antineoplastic immunotherapy   2. Malignant neoplasm of upper lobe of left lung ([place])      Dr. Randa Evens, MD, MPH [facility] at [facility] [phone] 04/16/2020 9:04 AM

## 2020-04-29 ENCOUNTER — Other Ambulatory Visit: Payer: Self-pay | Admitting: *Deleted

## 2020-04-29 DIAGNOSIS — Z87448 Personal history of other diseases of urinary system: Secondary | ICD-10-CM

## 2020-04-30 ENCOUNTER — Inpatient Hospital Stay (HOSPITAL_BASED_OUTPATIENT_CLINIC_OR_DEPARTMENT_OTHER): Payer: Medicare Other | Admitting: Oncology

## 2020-04-30 ENCOUNTER — Inpatient Hospital Stay: Payer: Medicare Other

## 2020-04-30 ENCOUNTER — Encounter: Payer: Self-pay | Admitting: Oncology

## 2020-04-30 VITALS — BP 115/48 | HR 82 | Temp 97.8°F | Resp 20 | Wt 165.0 lb

## 2020-04-30 DIAGNOSIS — Z5112 Encounter for antineoplastic immunotherapy: Secondary | ICD-10-CM

## 2020-04-30 DIAGNOSIS — C3412 Malignant neoplasm of upper lobe, left bronchus or lung: Secondary | ICD-10-CM | POA: Diagnosis not present

## 2020-04-30 DIAGNOSIS — D510 Vitamin B12 deficiency anemia due to intrinsic factor deficiency: Secondary | ICD-10-CM

## 2020-04-30 LAB — COMPREHENSIVE METABOLIC PANEL
ALT: 14 U/L (ref 0–44)
AST: 20 U/L (ref 15–41)
Albumin: 3.9 g/dL (ref 3.5–5.0)
Alkaline Phosphatase: 60 U/L (ref 38–126)
Anion gap: 9 (ref 5–15)
BUN: 14 mg/dL (ref 8–23)
CO2: 28 mmol/L (ref 22–32)
Calcium: 8.9 mg/dL (ref 8.9–10.3)
Chloride: 103 mmol/L (ref 98–111)
Creatinine, Ser: 0.37 mg/dL — ABNORMAL LOW (ref 0.44–1.00)
GFR, Estimated: >60 mL/min (ref >60)
Glucose, Bld: 93 mg/dL (ref 70–99)
Potassium: 4.1 mmol/L (ref 3.5–5.1)
Sodium: 140 mmol/L (ref 135–145)
Total Bilirubin: 0.6 mg/dL (ref 0.3–1.2)
Total Protein: 6.3 g/dL — ABNORMAL LOW (ref 6.5–8.1)

## 2020-04-30 LAB — CBC WITH DIFFERENTIAL/PLATELET
Abs Immature Granulocytes: 0.02 10*3/uL (ref 0.00–0.07)
Basophils Absolute: 0 10*3/uL (ref 0.0–0.1)
Basophils Relative: 1 %
Eosinophils Absolute: 0.2 10*3/uL (ref 0.0–0.5)
Eosinophils Relative: 4 %
HCT: 34.8 % — ABNORMAL LOW (ref 36.0–46.0)
Hemoglobin: 11.8 g/dL — ABNORMAL LOW (ref 12.0–15.0)
Immature Granulocytes: 1 %
Lymphocytes Relative: 19 %
Lymphs Abs: 0.8 10*3/uL (ref 0.7–4.0)
MCH: 30.5 pg (ref 26.0–34.0)
MCHC: 33.9 g/dL (ref 30.0–36.0)
MCV: 89.9 fL (ref 80.0–100.0)
Monocytes Absolute: 0.4 10*3/uL (ref 0.1–1.0)
Monocytes Relative: 10 %
Neutro Abs: 2.6 10*3/uL (ref 1.7–7.7)
Neutrophils Relative %: 65 %
Platelets: 183 10*3/uL (ref 150–400)
RBC: 3.87 MIL/uL (ref 3.87–5.11)
RDW: 13.3 % (ref 11.5–15.5)
WBC: 4 10*3/uL (ref 4.0–10.5)
nRBC: 0 % (ref 0.0–0.2)

## 2020-04-30 LAB — TSH: TSH: 3.247 u[IU]/mL (ref 0.350–4.500)

## 2020-04-30 MED ORDER — SODIUM CHLORIDE 0.9% FLUSH
10.0000 mL | INTRAVENOUS | Status: DC | PRN
  Administered 2020-04-30: 10 mL via INTRAVENOUS
  Filled 2020-04-30: qty 10

## 2020-04-30 MED ORDER — SODIUM CHLORIDE 0.9 % IV SOLN
10.0000 mg/kg | Freq: Once | INTRAVENOUS | Status: AC
  Administered 2020-04-30: 740 mg via INTRAVENOUS
  Filled 2020-04-30: qty 10

## 2020-04-30 MED ORDER — HEPARIN SOD (PORK) LOCK FLUSH 100 UNIT/ML IV SOLN
500.0000 [IU] | Freq: Once | INTRAVENOUS | Status: AC
  Administered 2020-04-30: 500 [IU] via INTRAVENOUS
  Filled 2020-04-30: qty 5

## 2020-04-30 MED ORDER — SODIUM CHLORIDE 0.9 % IV SOLN
Freq: Once | INTRAVENOUS | Status: AC
  Filled 2020-04-30: qty 250

## 2020-04-30 MED ORDER — HEPARIN SOD (PORK) LOCK FLUSH 100 UNIT/ML IV SOLN
INTRAVENOUS | Status: AC
  Filled 2020-04-30: qty 5

## 2020-04-30 NOTE — Progress Notes (Signed)
Patient states that for the past 3-4 weeks she has felt like her bladder is not emptying all the way. Patient also states that there is two golf ball size spots on her back, these spots are mushy and have a penny size hard bump inside this area. Patient also states that the blister inside her mouth is healing. Dr,Graham has prescribed a Ontiment that has helped with this blister as he called it.

## 2020-04-30 NOTE — Addendum Note (Signed)
Addended by: Luella Cook on: 04/30/2020 08:45 PM   Modules accepted: Orders

## 2020-04-30 NOTE — Progress Notes (Signed)
Hematology/Oncology Consult note [facility]  Telephone:([phone] [phone]  Patient Care Team: [name], MD as PCP - General (Family Medicine) Telford Nab, RN as Registered Nurse   Name of the patient: Jennifer Hodges  [id number]  [birth year]   Date of visit: 04/30/20  Diagnosis- Adenocarcinoma of the lung stage III T1b N2 M0  Chief complaint/ Reason for visit-on treatment assessment prior to cycle 26 of maintenance durvalumab  Heme/Onc history: patient is a 75 year old female who was seen by pulmonary and ENT for ongoing symptoms of bronchitis and possible laryngitis and received antibiotics for the same.She underwent CT chest in September 2020 which showed multiple bilateral lung nodules with associated mediastinal adenopathy and possible primary left upper lobe lung mass. This was followed by a PET CT scan which showed a left upper lobe lung mass measuring 1.9 x 1.2 cm with an SUV of 10.7. Conglomerate AP window adenopathy measuring 5.8 x 2.1 cm with an SUV of 14. She was also noted to have hypermetabolic left hilar adenopathy and left supraclavicular 0.7 cm lymph node with an SUV of 4.6. Noted to have a 7 mm right lower lobe lung nodule with faint metabolic activity. No evidence of other distant metastatic disease. This was followed by a CT super D chest without contrast. The right lower lobe lung nodules were somewhat larger as compared to September 2020. 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 Maintenance durvalumab started in January 2021after scan showed partial response.  Interval history-denies any significant cough at this time.  Vertigo is overall stable.  She is concerned about a possible knot in her back.  ECOG PS- 1 Pain scale- 0 Opioid associated constipation- no  Review of systems- Review of Systems  Constitutional: Positive for malaise/fatigue. Negative for chills, fever and weight loss.  HENT: Negative for congestion, ear discharge and nosebleeds.   Eyes: Negative for blurred vision.  Respiratory: Negative for cough, hemoptysis, sputum production, shortness of breath and wheezing.   Cardiovascular: Negative for chest pain, palpitations, orthopnea and claudication.  Gastrointestinal: Negative for abdominal pain, blood in stool, constipation, diarrhea, heartburn, melena, nausea and vomiting.  Genitourinary: Negative for dysuria, flank pain, frequency, hematuria and urgency.  Musculoskeletal: Negative for back pain, joint pain and myalgias.  Skin: Negative for rash.  Neurological: Negative for dizziness, tingling, focal weakness, seizures, weakness and headaches.  Endo/Heme/Allergies: Does not bruise/bleed easily.  Psychiatric/Behavioral: Negative for depression and suicidal ideas. The patient does not have insomnia.        Allergies  Allergen Reactions  . Aspirin Hives and Other (See Comments)    Difficulty breathing  . Celebrex [Celecoxib] Shortness Of Breath  . Morphine And Related Nausea And Vomiting and Swelling  . Adhesive [Tape] Other (See Comments)    Took top layer of skin off when removed.  . Clarithromycin Nausea And Vomiting  . Codeine Nausea And Vomiting  . Darvon [Propoxyphene Hcl] Nausea And Vomiting  . Demerol [Meperidine] Nausea And Vomiting  . Flonase [Fluticasone Propionate] Other (See Comments)    Fungal infection  . Simvastatin Other (See Comments)    "caused ulcers in mouth,  and fever"  .  Talwin [Pentazocine] Nausea And Vomiting     Past Medical History:  Diagnosis Date  . Anemia   . Asthma    No Inhalers--Dr. [name] will order as needed  . Bronchiectasis (HCC)    mild  . Chronic headaches     followed by [facility] migraines  . COPD (chronic obstructive pulmonary disease) ([place])   . DDD (degenerative disc disease), lumbar   . Diabetes mellitus without complication [facility])    "doctor says I no longer have diabetes"    . Diverticulosis   . Family history of adverse reaction to anesthesia    PONV  . Gall stones    history of  . GERD (gastroesophageal reflux disease)    EGD 8/09- non bleeding erosive gastritis, documentd esophageal ulcerations.   . Hiatal hernia   . History of pneumonia   . Hypercholesterolemia   . IBS (irritable bowel syndrome)   . Malignant neoplasm of upper lobe of left lung ([place]) 12/27/2018  . Meniere disease   . Murmur   . Osteoarthritis    lumbar disc disease, left hip  . Personal history of chemotherapy   . Personal history of radiation therapy   . PONV (postoperative nausea and vomiting)    "Only with last hip and I believe it was due to the morphine"   . Sleep apnea    cpap asked to bring mask and tubing  . Vertigo   . Weakness of right side of body      Past Surgical History:  Procedure Laterality Date  . ABDOMINAL HYSTERECTOMY  age 67  . ANTERIOR CERVICAL DECOMP/DISCECTOMY FUSION N/A 02/24/2015   Procedure: CERVICAL FOUR-FIVE, CERVICAL FIVE-SIX, CERVICAL SIX-SEVEN ANTERIOR CERVICAL DECOMPRESSION/DISCECTOMY FUSION ;  Surgeon: [name], MD;  Location: [place] [facility];  Service: Neurosurgery;  Laterality: N/A;  C45 C56 C67 anterior cervical decompression with fusion interbody prosthesis plating and bonegraft  . APPENDECTOMY    . BACK SURGERY     4th lumbar fusion  . BLADDER SURGERY N/A    with vaginal wall repair  . BREAST CYST ASPIRATION Bilateral    neg  . BREAST SURGERY Bilateral    cyst removed and reduction   . CARDIAC CATHETERIZATION  2014  . CARPAL TUNNEL RELEASE Right 02/11/2016   Procedure: CARPAL TUNNEL RELEASE;  Surgeon: [name], MD;  Location: [facility] ORS;  Service: Orthopedics;  Laterality: Right;  . CATARACT EXTRACTION W/ INTRAOCULAR LENS IMPLANT Bilateral 2015  . CHOLECYSTECTOMY    . EUS N/A 05/31/2012   Procedure: UPPER ENDOSCOPIC ULTRASOUND (EUS) LINEAR;  Surgeon: [name], MD;  Location: [facility];  Service: Endoscopy;  Laterality: N/A;  . EXCISIONAL HEMORRHOIDECTOMY    . JOINT REPLACEMENT Bilateral   . KNEE ARTHROSCOPY WITH LATERAL MENISECTOMY Right 07/07/2015   Procedure: KNEE ARTHROSCOPY WITH LATERAL MENISECTOMY, PARTIAL SYNOVECTOMY;  Surgeon: [name], MD;  Location: [facility] ORS;  Service: Orthopedics;  Laterality: Right;  . LUMBAR LAMINECTOMY    . PORTA CATH INSERTION N/A 01/07/2019   Procedure: PORTA CATH INSERTION;  Surgeon: [name], MD;  Location: [place] [facility];  Service: Cardiovascular;  Laterality: N/A;  . REDUCTION MAMMAPLASTY  1990  . RIGHT OOPHORECTOMY    . TOTAL HIP ARTHROPLASTY Left 05/01/2014   Dr. [name]  . TOTAL HIP ARTHROPLASTY Right 08/05/2014   Procedure: TOTAL HIP ARTHROPLASTY ANTERIOR APPROACH;  Surgeon: [name], MD;  Location: [facility] ORS;  Service: Orthopedics;  Laterality: Right;  . ULNAR NERVE TRANSPOSITION  Right 02/11/2016   Procedure: ULNAR NERVE DECOMPRESSION/TRANSPOSITION;  Surgeon: [name], MD;  Location: [facility];  Service: Orthopedics;  Laterality: Right;  Marland Kitchen VIDEO BRONCHOSCOPY WITH ENDOBRONCHIAL ULTRASOUND Left 12/19/2018   Procedure: VIDEO BRONCHOSCOPY WITH ENDOBRONCHIAL ULTRASOUND, LEFT, SLEEP APNEA;  Surgeon: [name], MD;  Location: [facility] ORS;  Service: Thoracic;  Laterality: Left;    Social History   Socioeconomic History  . Marital status: Married    Spouse name: Not on file  . Number of children: Not on file  . Years of education: Not on file  . Highest education level: Not on file   Occupational History  . Not on file  Tobacco Use  . Smoking status: Former Smoker    Quit date: 06/13/2007    Years since quitting: 12.8  . Smokeless tobacco: Never Used  Vaping Use  . Vaping Use: Never used  Substance and Sexual Activity  . Alcohol use: No    Alcohol/week: 0.0 standard drinks  . Drug use: No  . Sexual activity: Never  Other Topics Concern  . Not on file  Social History Narrative  . Not on file   Social Determinants of Health   Financial Resource Strain: Not on file  Food Insecurity: Not on file  Transportation Needs: Not on file  Physical Activity: Not on file  Stress: Not on file  Social Connections: Not on file  Intimate Partner Violence: Not on file    Family History  Problem Relation Age of Onset  . Heart disease Mother        s/p stent  . Hypertension Mother   . Hypercholesterolemia Mother   . Diabetes Father   . Stomach cancer Other        uncle  . Breast cancer Cousin   . Breast cancer Paternal Aunt      Current Outpatient Medications:  .  albuterol (PROVENTIL) (2.5 MG/3ML) 0.083% nebulizer solution, Take 2.5 mg by nebulization every 6 (six) hours as needed for wheezing., Disp: , Rfl:  .  albuterol (VENTOLIN HFA) 108 (90 Base) MCG/ACT inhaler, Inhale 2 puffs into the lungs every 6 (six) hours as needed for wheezing or shortness of breath., Disp: 18 g, Rfl: 0 .  Bioflavonoid Products (ESTER C PO), Take 1 tablet by mouth 3 (three) times daily., Disp: , Rfl:  .  Calcium-Magnesium-Zinc (CAL-MAG-ZINC PO), Take 1 tablet by mouth daily., Disp: , Rfl:  .  cetirizine (ZYRTEC) 5 MG tablet, Take 1 tablet (5 mg total) by mouth daily. (Patient taking differently: Take 5 mg by mouth daily as needed (runny nose.).), Disp: 30 tablet, Rfl: 0 .  CHLORASEPTIC MAX SORE THROAT 1.5-33 % LIQD, Use as directed 1 spray in the mouth or throat as needed (throat irritation.). , Disp: , Rfl:  .  Cholecalciferol (EQL VITAMIN D3) 25 MCG (1000 UT) capsule, Take 4,000 Units  by mouth daily., Disp: , Rfl:  .  Clotrimazole 1 % OINT, Apply 1 application topically in the morning, at noon, and at bedtime., Disp: 56.7 g, Rfl: 0 .  Coenzyme Q10 (CO Q 10) 100 MG CAPS, Take 100 mg by mouth daily. , Disp: , Rfl:  .  Cyanocobalamin (VITAMIN B-12) 2500 MCG SUBL, Place 2,500 mcg under the tongue daily., Disp: , Rfl:  .  cyclobenzaprine (FLEXERIL) 10 MG tablet, Take 10 mg by mouth at bedtime., Disp: , Rfl:  .  DHEA 25 MG CAPS, Take 25 mg by mouth daily., Disp: , Rfl:  .  Ensure (ENSURE), Take 237  mLs by mouth 2 (two) times daily between meals., Disp: , Rfl:  .  ezetimibe (ZETIA) 10 MG tablet, Take 5 mg by mouth in the morning and at bedtime. , Disp: , Rfl:  .  folic acid (FOLVITE) 175 MCG tablet, Take 400 mcg by mouth 2 (two) times daily. , Disp: , Rfl:  .  Garlic 1025 MG CAPS, Take 1,000 mg by mouth daily., Disp: , Rfl:  .  guaiFENesin-dextromethorphan (ROBITUSSIN DM) 100-10 MG/5ML syrup, Take 5 mLs by mouth every 4 (four) hours as needed for cough., Disp: , Rfl:  .  Histamine Dihydrochloride (AUSTRALIAN DREAM ARTHRITIS EX), Apply 1 application topically 4 (four) times daily as needed (pain.)., Disp: , Rfl:  .  HYDROcodone-acetaminophen (NORCO/VICODIN) 5-325 MG tablet, 1-2 tabs po bid prn (Patient taking differently: Take 1 tablet by mouth at bedtime.), Disp: 10 tablet, Rfl: 0 .  HYDROcodone-homatropine (HYCODAN) 5-1.5 MG/5ML syrup, Take 5 mLs by mouth 3 (three) times daily as needed for cough., Disp: 120 mL, Rfl: 0 .  ipratropium (ATROVENT) 0.03 % nasal spray, SMARTSIG:1-2 Spray(s) Both Nares 3 Times Daily PRN, Disp: , Rfl:  .  lidocaine-prilocaine (EMLA) cream, lidocaine-prilocaine 2.5 %-2.5 % topical cream, Disp: , Rfl:  .  meclizine (ANTIVERT) 25 MG tablet, Take 1 tablet (25 mg total) by mouth every 6 (six) hours as needed for dizziness., Disp: 30 tablet, Rfl: 0 .  montelukast (SINGULAIR) 10 MG tablet, Take 10 mg by mouth at bedtime. , Disp: , Rfl:  .  ondansetron (ZOFRAN-ODT)  4 MG disintegrating tablet, Take 4 mg by mouth every 8 (eight) hours as needed for nausea/vomiting., Disp: , Rfl:  .  Polyethyl Glycol-Propyl Glycol (SYSTANE) 0.4-0.3 % SOLN, Place 1 drop into both eyes 5 (five) times daily as needed (dry/irritated eyes). , Disp: , Rfl:  .  pyridOXINE (VITAMIN B-6) 100 MG tablet, Take 100 mg by mouth daily., Disp: , Rfl:  .  Selenium 200 MCG CAPS, Take 200 mcg by mouth daily., Disp: , Rfl:  .  sucralfate (CARAFATE) 1 g tablet, 1 tablet 3 (three) times daily as needed. , Disp: , Rfl:  .  triamcinolone (KENALOG) 0.1 %, Apply 1 application topically 2 (two) times daily., Disp: 30 g, Rfl: 0 .  UBRELVY 100 MG TABS, Take by mouth., Disp: , Rfl:  .  Vitamin A 2250 MCG (7500 UT) CAPS, Take 1 capsule by mouth daily. , Disp: , Rfl:  .  vitamin E 400 UNIT capsule, Take 400 Units by mouth daily., Disp: , Rfl:  .  Zoledronic Acid (RECLAST IV), Inject 1 Dose into the vein as directed. ONCE A YEAR, Disp: , Rfl:  No current facility-administered medications for this visit.  Facility-Administered Medications Ordered in Other Visits:  .  durvalumab (IMFINZI) 740 mg in sodium chloride 0.9 % 100 mL chemo infusion, 10 mg/kg (Treatment Plan Recorded), Intravenous, Once, Sindy Guadeloupe, MD, Last Rate: 115 mL/hr at 04/30/20 1043, 740 mg at 04/30/20 1043 .  heparin lock flush 100 unit/mL, 500 Units, Intravenous, Once, Sindy Guadeloupe, MD .  sodium chloride flush (NS) 0.9 % injection 10 mL, 10 mL, Intravenous, PRN, Sindy Guadeloupe, MD, 10 mL at 04/30/20 0902  Physical exam:  Vitals:   04/30/20 0908  BP: (!) 115/48  Pulse: 82  Resp: 20  Temp: 97.8 F (36.6 C)  SpO2: 99%  Weight: 165 lb (74.8 kg)   Physical Exam Constitutional:      General: She is not in acute distress. Eyes:  Extraocular Movements: EOM normal.  Cardiovascular:     Rate and Rhythm: Normal rate and regular rhythm.     Heart sounds: Normal heart sounds.  Pulmonary:     Effort: Pulmonary effort is normal.      Breath sounds: Normal breath sounds.  Abdominal:     General: Bowel sounds are normal.     Palpations: Abdomen is soft.  Musculoskeletal:     Comments: No palpable swelling/mass over the back.  Tenderness to superficial palpation in the left paraspinal region along T2-T4  Skin:    General: Skin is warm and dry.  Neurological:     Mental Status: She is alert and oriented to person, place, and time.      CMP Latest Ref Rng & Units 04/30/2020  Glucose 70 - 99 mg/dL 93  BUN 8 - 23 mg/dL 14  Creatinine 0.44 - 1.00 mg/dL 0.37(L)  Sodium 135 - 145 mmol/L 140  Potassium 3.5 - 5.1 mmol/L 4.1  Chloride 98 - 111 mmol/L 103  CO2 22 - 32 mmol/L 28  Calcium 8.9 - 10.3 mg/dL 8.9  Total Protein 6.5 - 8.1 g/dL 6.3(L)  Total Bilirubin 0.3 - 1.2 mg/dL 0.6  Alkaline Phos 38 - 126 U/L 60  AST 15 - 41 U/L 20  ALT 0 - 44 U/L 14   CBC Latest Ref Rng & Units 04/30/2020  WBC 4.0 - 10.5 K/uL 4.0  Hemoglobin 12.0 - 15.0 g/dL 11.8(L)  Hematocrit 36.0 - 46.0 % 34.8(L)  Platelets 150 - 400 K/uL 183    No images are attached to the encounter.  CT CHEST ABDOMEN PELVIS W CONTRAST  Result Date: 04/03/2020 CLINICAL DATA:  Stage III adenocarcinoma of the lung.  Restaging. EXAM: CT CHEST, ABDOMEN, AND PELVIS WITH CONTRAST TECHNIQUE: Multidetector CT imaging of the chest, abdomen and pelvis was performed following the standard protocol during bolus administration of intravenous contrast. CONTRAST:  150m OMNIPAQUE IOHEXOL 300 MG/ML  SOLN COMPARISON:  01/10/2020 FINDINGS: CT CHEST FINDINGS Cardiovascular: The heart size is normal. No substantial pericardial effusion. Coronary artery calcification is evident. Atherosclerotic calcification is noted in the wall of the thoracic aorta. Right Port-A-Cath tip is positioned in the distal SVC near the RA junction. Mediastinum/Nodes: No mediastinal lymphadenopathy. There is no hilar lymphadenopathy. The esophagus has normal imaging features. There is no axillary  lymphadenopathy. Lungs/Pleura: Centrilobular emphsyema noted. Left parahilar post treatment scarring is similar to prior. 8 mm cluster of nodules in the right lower lobe peripherally on 95/3 is stable. 2 mm posterior right lower lobe nodule on 100/3 is new since prior. Calcified granuloma again noted in the lingula. No pleural effusion. Musculoskeletal: No worrisome lytic or sclerotic osseous abnormality. CT ABDOMEN PELVIS FINDINGS Hepatobiliary: No suspicious focal abnormality within the liver parenchyma. Mild intrahepatic biliary duct prominence is stable in this patient status post cholecystectomy. Common bile duct measures 9 mm diameter in the head of pancreas similar to a 8 mm previously (remeasured). Pancreas: Pancreas divisum anatomy evident. Spleen: No splenomegaly. No focal mass lesion. Adrenals/Urinary Tract: No adrenal nodule or mass. Focal cortical scarring noted interpolar right kidney. Scattered bilateral tiny hypodensities in the kidneys are too small to characterize but likely benign. No evidence for hydroureter. Bladder obscured by beam hardening artifact from bilateral hip replacement. Stomach/Bowel: Stomach is unremarkable. No gastric wall thickening. No evidence of outlet obstruction. Duodenum is normally positioned as is the ligament of Treitz. No small bowel wall thickening. No small bowel dilatation. The terminal ileum is normal. The appendix  is not visualized, but there is no edema or inflammation in the region of the cecum. No gross colonic mass. No colonic wall thickening. Diverticular changes are noted in the left colon without evidence of diverticulitis. Vascular/Lymphatic: There is abdominal aortic atherosclerosis without aneurysm. There is no gastrohepatic or hepatoduodenal ligament lymphadenopathy. No retroperitoneal or mesenteric lymphadenopathy. No pelvic sidewall lymphadenopathy. Reproductive: Uterus surgically absent.  There is no adnexal mass. Other: No intraperitoneal free fluid.  Musculoskeletal: Pelvic floor laxity noted. Status post bilateral hip replacement. No worrisome lytic or sclerotic osseous abnormality. IMPRESSION: 1. Stable post treatment scarring left parahilar lung. The 2 areas of concern in the radiation field on the prior study are stable. No new or progressive findings to suggest recurrent or metastatic disease in the chest, abdomen, or pelvis. 2. 2 mm posterior right lower lobe pulmonary nodule, new since prior. Attention on follow-up recommended. 3. Left colonic diverticulosis without diverticulitis. 4. Aortic Atherosclerosis (ICD10-I70.0) and Emphysema (ICD10-J43.9). Electronically Signed   By: Misty Stanley M.D.   On: 04/03/2020 12:44     Assessment and plan- Patient is a 75 y.o. female with stage III adenocarcinoma of the lung. She is here for on treatment assessment prior to cycle 26 maintenance durvalumab  Counts okay to proceed with cycle 26 of maintenance durvalumab today which would be her last cycle.  I will see her back in 3 months with port labs CBC with differential, CMP with repeat CT chest abdomen and pelvis with contrast Patient is concerned about a possible swelling in her back.  I do not palpate any mass.  Suspect her pain is musculoskeletal.  She recently had a CT chest abdomen and pelvis with contrast on 04/03/2020 which did not show any concerning findings in that area   Visit Diagnosis 1. Malignant neoplasm of upper lobe of left lung ([place])   2. Encounter for antineoplastic immunotherapy      Dr. Randa Evens, MD, MPH [facility] [phone] 04/30/2020 10:49 AM

## 2020-05-25 ENCOUNTER — Other Ambulatory Visit: Payer: Self-pay

## 2020-05-25 ENCOUNTER — Encounter: Payer: Self-pay | Admitting: Urology

## 2020-05-25 ENCOUNTER — Ambulatory Visit (INDEPENDENT_AMBULATORY_CARE_PROVIDER_SITE_OTHER): Payer: Medicare Other | Admitting: Urology

## 2020-05-25 VITALS — BP 131/71 | HR 72 | Ht 61.0 in | Wt 163.0 lb

## 2020-05-25 DIAGNOSIS — N3946 Mixed incontinence: Secondary | ICD-10-CM

## 2020-05-25 DIAGNOSIS — R32 Unspecified urinary incontinence: Secondary | ICD-10-CM

## 2020-05-25 LAB — URINALYSIS, COMPLETE
Bilirubin, UA: NEGATIVE
Glucose, UA: NEGATIVE
Ketones, UA: NEGATIVE
Leukocytes,UA: NEGATIVE
Nitrite, UA: NEGATIVE
Protein,UA: NEGATIVE
RBC, UA: NEGATIVE
Specific Gravity, UA: 1.015 (ref 1.005–1.030)
Urobilinogen, Ur: 0.2 mg/dL (ref 0.2–1.0)
pH, UA: 7 (ref 5.0–7.5)

## 2020-05-25 LAB — MICROSCOPIC EXAMINATION: Bacteria, UA: NONE SEEN

## 2020-05-25 LAB — BLADDER SCAN AMB NON-IMAGING

## 2020-05-25 NOTE — Progress Notes (Signed)
05/25/2020 9:05 AM   Jennifer Hodges 09-Apr-1945 [id number]  Referring provider: [name], [place],  [place] [address]  No chief complaint on file.   HPI: I was consulted to assess the patient's voiding dysfunction.  She can leak with coughing sneezing.  She can have urge incontinence.  She describes foot on the floor syndrome.  She has mild bedwetting.  Most days she does not wear a pad.  She describes a prolapse repair and bladder suspension by gynecology and Dr. [name] about 6 years ago.  She describes emptying her bladder that he wants to keep an eye on like a cyst.  Her flow is sometimes poor sometimes good.  She has had a hysterectomy.  She is prone to constipation  I reviewed the medical records and the symptoms in 2018 were very similar with mild intermittent mixed incontinence generally not wearing a pad.  There was no reference of anything abnormal in the bladder.  She was using some local estrogen cream at the meatus.  She has had radiation and chemotherapy and is lung cancer free currently.   PMH: Past Medical History:  Diagnosis Date  . Anemia   . Asthma    No Inhalers--Dr. [name] will order as needed  . Bronchiectasis (HCC)    mild  . Chronic headaches     followed by [facility] migra[facility]  . COPD (chronic obstructive pulmonary disease) ([place])   . DDD (degenerative disc disease), lumbar   . Diabetes mellitus without complication [facility])    "doctor says I no longer have diabetes"    . Diverticulosis   . Family history of adverse reaction to anesthesia    PONV  . Gall stones    history of  . GERD (gastroesophageal reflux disease)    EGD 8/09- non bleeding erosive gastritis, documentd esophageal ulcerations.   . Hiatal hernia   . History of pneumonia   . Hypercholesterolemia   . IBS (irritable bowel syndrome)   . Malignant neoplasm of upper lobe of left lung ([place]) 12/27/2018  . Meniere disease   . Murmur   . Osteoarthritis     lumbar disc disease, left hip  . Personal history of chemotherapy   . Personal history of radiation therapy   . PONV (postoperative nausea and vomiting)    "Only with last hip and I believe it was due to the morphine"   . Sleep apnea    cpap asked to bring mask and tubing  . Vertigo   . Weakness of right side of body     Surgical History: Past Surgical History:  Procedure Laterality Date  . ABDOMINAL HYSTERECTOMY  age 26  . ANTERIOR CERVICAL DECOMP/DISCECTOMY FUSION N/A 02/24/2015   Procedure: CERVICAL FOUR-FIVE, CERVICAL FIVE-SIX, CERVICAL SIX-SEVEN ANTERIOR CERVICAL DECOMPRESSION/DISCECTOMY FUSION ;  Surgeon: [name], MD;  Location: [place] [facility];  Service: Neurosurgery;  Laterality: N/A;  C45 C56 C67 anterior cervical decompression with fusion interbody prosthesis plating and bonegraft  . APPENDECTOMY    . BACK SURGERY     4th lumbar fusion  . BLADDER SURGERY N/A    with vaginal wall repair  . BREAST CYST ASPIRATION Bilateral    neg  . BREAST SURGERY Bilateral    cyst removed and reduction  . CARDIAC CATHETERIZATION  2014  . CARPAL TUNNEL RELEASE Right 02/11/2016   Procedure: CARPAL TUNNEL RELEASE;  Surgeon: [name], MD;  Location: [facility] ORS;  Service: Orthopedics;  Laterality: Right;  . CATARACT  EXTRACTION W/ INTRAOCULAR LENS IMPLANT Bilateral 2015  . CHOLECYSTECTOMY    . EUS N/A 05/31/2012   Procedure: UPPER ENDOSCOPIC ULTRASOUND (EUS) LINEAR;  Surgeon: [name], MD;  Location: [facility];  Service: Endoscopy;  Laterality: N/A;  . EXCISIONAL HEMORRHOIDECTOMY    . JOINT REPLACEMENT Bilateral   . KNEE ARTHROSCOPY WITH LATERAL MENISECTOMY Right 07/07/2015   Procedure: KNEE ARTHROSCOPY WITH LATERAL MENISECTOMY, PARTIAL SYNOVECTOMY;  Surgeon: [name], MD;  Location: [facility] ORS;  Service: Orthopedics;  Laterality: Right;  . LUMBAR LAMINECTOMY    . PORTA CATH INSERTION N/A 01/07/2019   Procedure: PORTA CATH INSERTION;  Surgeon: [name], MD;  Location:  [facility];  Service: Cardiovascular;  Laterality: N/A;  . REDUCTION MAMMAPLASTY  1990  . RIGHT OOPHORECTOMY    . TOTAL HIP ARTHROPLASTY Left 05/01/2014   Dr. [name]  . TOTAL HIP ARTHROPLASTY Right 08/05/2014   Procedure: TOTAL HIP ARTHROPLASTY ANTERIOR APPROACH;  Surgeon: [name], MD;  Location: [facility] ORS;  Service: Orthopedics;  Laterality: Right;  . ULNAR NERVE TRANSPOSITION Right 02/11/2016   Procedure: ULNAR NERVE DECOMPRESSION/TRANSPOSITION;  Surgeon: [name], MD;  Location: [facility] ORS;  Service: Orthopedics;  Laterality: Right;  Marland Kitchen VIDEO BRONCHOSCOPY WITH ENDOBRONCHIAL ULTRASOUND Left 12/19/2018   Procedure: VIDEO BRONCHOSCOPY WITH ENDOBRONCHIAL ULTRASOUND, LEFT, SLEEP APNEA;  Surgeon: [name], MD;  Location: [facility] ORS;  Service: Thoracic;  Laterality: Left;    Home Medications:  Allergies as of 05/25/2020      Reactions   Aspirin Hives, Other (See Comments)   Difficulty breathing   Celebrex [celecoxib] Shortness Of Breath   Morphine And Related Nausea And Vomiting, Swelling   Adhesive [tape] Other (See Comments)   Took top layer of skin off when removed.   Clarithromycin Nausea And Vomiting   Codeine Nausea And Vomiting   Darvon [propoxyphene Hcl] Nausea And Vomiting   Demerol [meperidine] Nausea And Vomiting   Flonase [fluticasone Propionate] Other (See Comments)   Fungal infection   Simvastatin Other (See Comments)   "caused ulcers in mouth, and fever"   Talwin [pentazocine] Nausea And Vomiting      Medication List       Accurate as of May 25, 2020  9:05 AM. If you have any questions, ask your nurse or doctor.        albuterol (2.5 MG/3ML) 0.083% nebulizer solution Commonly known as: PROVENTIL Take 2.5 mg by nebulization every 6 (six) hours as needed for wheezing.   albuterol 108 (90 Base) MCG/ACT inhaler Commonly known as: VENTOLIN HFA Inhale 2 puffs into the lungs every 6 (six) hours as needed for wheezing or shortness of  breath.   AUSTRALIAN DREAM ARTHRITIS EX Apply 1 application topically 4 (four) times daily as needed (pain.).   CAL-MAG-ZINC PO Take 1 tablet by mouth daily.   cetirizine 5 MG tablet Commonly known as: ZYRTEC Take 1 tablet (5 mg total) by mouth daily. What changed:   when to take this  reasons to take this   Chloraseptic Max Sore Throat 1.5-33 % Liqd Generic drug: Phenol-Glycerin Use as directed 1 spray in the mouth or throat as needed (throat irritation.).   Clotrimazole 1 % Oint Apply 1 application topically in the morning, at noon, and at bedtime.   Co Q 10 100 MG Caps Take 100 mg by mouth daily.   cyclobenzaprine 10 MG tablet Commonly known as: FLEXERIL Take 10 mg by mouth at bedtime.   DHEA 25 MG Caps Take 25 mg by  mouth daily.   Ensure Take 237 mLs by mouth 2 (two) times daily between meals.   EQL Vitamin D3 25 MCG (1000 UT) capsule Generic drug: Cholecalciferol Take 4,000 Units by mouth daily.   ESTER C PO Take 1 tablet by mouth 3 (three) times daily.   ezetimibe 10 MG tablet Commonly known as: ZETIA Take 5 mg by mouth in the morning and at bedtime.   folic acid 833 MCG tablet Commonly known as: FOLVITE Take 400 mcg by mouth 2 (two) times daily.   Garlic 8250 MG Caps Take 1,000 mg by mouth daily.   guaiFENesin-dextromethorphan 100-10 MG/5ML syrup Commonly known as: ROBITUSSIN DM Take 5 mLs by mouth every 4 (four) hours as needed for cough.   HYDROcodone-acetaminophen 5-325 MG tablet Commonly known as: NORCO/VICODIN 1-2 tabs po bid prn What changed:   how much to take  how to take this  when to take this  additional instructions   HYDROcodone-homatropine 5-1.5 MG/5ML syrup Commonly known as: HYCODAN Take 5 mLs by mouth 3 (three) times daily as needed for cough.   ipratropium 0.03 % nasal spray Commonly known as: ATROVENT SMARTSIG:1-2 Spray(s) Both Nares 3 Times Daily PRN   lidocaine-prilocaine cream Commonly known as:  EMLA lidocaine-prilocaine 2.5 %-2.5 % topical cream   meclizine 25 MG tablet Commonly known as: ANTIVERT Take 1 tablet (25 mg total) by mouth every 6 (six) hours as needed for dizziness.   montelukast 10 MG tablet Commonly known as: SINGULAIR Take 10 mg by mouth at bedtime.   ondansetron 4 MG disintegrating tablet Commonly known as: ZOFRAN-ODT Take 4 mg by mouth every 8 (eight) hours as needed for nausea/vomiting.   pyridOXINE 100 MG tablet Commonly known as: VITAMIN B-6 Take 100 mg by mouth daily.   RECLAST IV Inject 1 Dose into the vein as directed. ONCE A YEAR   Selenium 200 MCG Caps Take 200 mcg by mouth daily.   sucralfate 1 g tablet Commonly known as: CARAFATE 1 tablet 3 (three) times daily as needed.   Systane 0.4-0.3 % Soln Generic drug: Polyethyl Glycol-Propyl Glycol Place 1 drop into both eyes 5 (five) times daily as needed (dry/irritated eyes).   triamcinolone 0.1 % Commonly known as: KENALOG Apply 1 application topically 2 (two) times daily.   Ubrelvy 100 MG Tabs Generic drug: Ubrogepant Take by mouth.   Vitamin A 2250 MCG (7500 UT) Caps Take 1 capsule by mouth daily.   Vitamin B-12 2500 MCG Subl Place 2,500 mcg under the tongue daily.   vitamin E 180 MG (400 UNITS) capsule Take 400 Units by mouth daily.       Allergies:  Allergies  Allergen Reactions  . Aspirin Hives and Other (See Comments)    Difficulty breathing  . Celebrex [Celecoxib] Shortness Of Breath  . Morphine And Related Nausea And Vomiting and Swelling  . Adhesive [Tape] Other (See Comments)    Took top layer of skin off when removed.  . Clarithromycin Nausea And Vomiting  . Codeine Nausea And Vomiting  . Darvon [Propoxyphene Hcl] Nausea And Vomiting  . Demerol [Meperidine] Nausea And Vomiting  . Flonase [Fluticasone Propionate] Other (See Comments)    Fungal infection  . Simvastatin Other (See Comments)    "caused ulcers in mouth, and fever"  . Talwin [Pentazocine] Nausea  And Vomiting    Family History: Family History  Problem Relation Age of Onset  . Heart disease Mother        s/p stent  . Hypertension Mother   .  Hypercholesterolemia Mother   . Diabetes Father   . Stomach cancer Other        uncle  . Breast cancer Cousin   . Breast cancer Paternal Aunt     Social History:  reports that she quit smoking about 12 years ago. She has never used smokeless tobacco. She reports that she does not drink alcohol and does not use drugs.  ROS:                                        Physical Exam: There were no vitals taken for this visit.  Constitutional:  Alert and oriented, No acute distress. HEENT: [place] AT, moist mucus membranes.  Trachea midline, no masses. Cardiovascular: No clubbing, cyanosis, or edema. Respiratory: Normal respiratory effort, no increased work of breathing. GI: Abdomen is soft, nontender, nondistended, no abdominal masses GU: No CVA tenderness.  Skin: No rashes, bruises or suspicious lesions. Lymph: No cervical or inguinal adenopathy. Neurologic: Grossly intact, no focal deficits, moving all 4 extremities. Psychiatric: Normal mood and affect.  Laboratory Data: Lab Results  Component Value Date   WBC 4.0 04/30/2020   HGB 11.8 (L) 04/30/2020   HCT 34.8 (L) 04/30/2020   MCV 89.9 04/30/2020   PLT 183 04/30/2020    Lab Results  Component Value Date   CREATININE 0.37 (L) 04/30/2020    No results found for: PSA  No results found for: TESTOSTERONE  Lab Results  Component Value Date   HGBA1C 5.7 (H) 02/18/2015    Urinalysis    Component Value Date/Time   COLORURINE YELLOW (A) 01/30/2019 1315   APPEARANCEUR CLEAR (A) 01/30/2019 1315   LABSPEC 1.012 01/30/2019 1315   PHURINE 6.0 01/30/2019 1315   GLUCOSEU NEGATIVE 01/30/2019 1315   GLUCOSEU NEGATIVE 06/19/2014 1002   [place] 01/30/2019 [place] 01/30/2019 1315   BILIRUBINUR neg 03/04/2015 [place]  01/30/2019 [place] 01/30/2019 1315   UROBILINOGEN 1.0 03/04/2015 1339   UROBILINOGEN 0.2 06/19/2014 1002   NITRITE NEGATIVE 01/30/2019 1315   LEUKOCYTESUR NEGATIVE 01/30/2019 1315    Pertinent Imaging: Urinalysis negative.  Chart reviewed.  Urine sent for culture.  Bladder scan 1 mL  Assessment & Plan: Return in 3-5 weeks for cystoscopy for baseline looking bladder.  Call if urine culture is positive.  I think reassurance will be helpful.  I will check for any foreign body in the bladder and any vaginal issues  There are no diagnoses linked to this encounter.  No follow-ups on file.  Reece Packer, MD  [facility] [address], [place], [place] [address] [phone]

## 2020-05-28 LAB — CULTURE, URINE COMPREHENSIVE

## 2020-05-28 IMAGING — CT CT CHEST W/ CM
1 of 3 series · 11 of 30 positions shown, 14 images · IV contrast (omnipaque)
Comparison: Chest CT of 12/19/2018. Most recent abdominopelvic CT
of 01/21/2019.

CLINICAL DATA: Stage III left-sided lung cancer, treated with
radiation therapy and chemotherapy. Pre immunotherapy. Productive
cough shortness of breath. COPD. Gastroesophageal reflux disease.

EXAM:
CT CHEST, ABDOMEN, AND PELVIS WITH CONTRAST
TECHNIQUE: Multidetector CT imaging of the chest, abdomen and pelvis was
performed following the standard protocol during bolus
administration of intravenous contrast.
CONTRAST:  100mL OMNIPAQUE IOHEXOL 300 MG/ML  SOLN

[Series 503: cap with (person_name) · axial · 0.68mm/px · z∈[-891,-396]mm · 11 of 121 slices shown, 14 images]
[im 11/121  mediastinal]
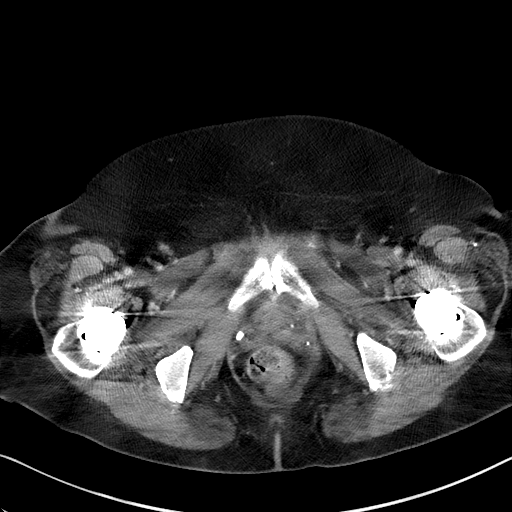
[im 11/121  lung]
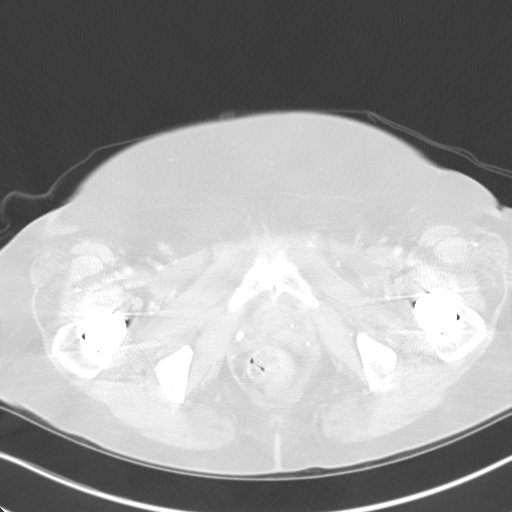
[im 22/121  lung]
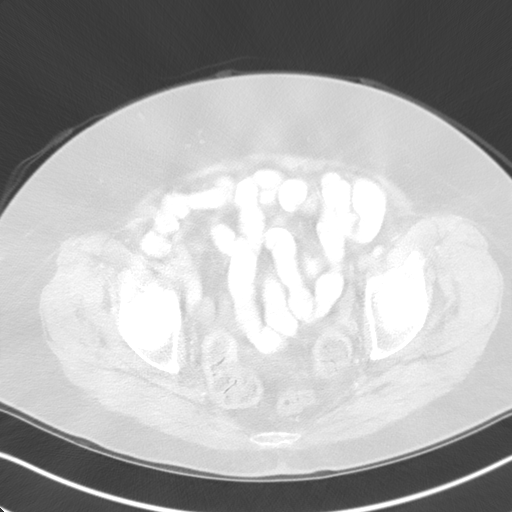
[im 33/121  lung]
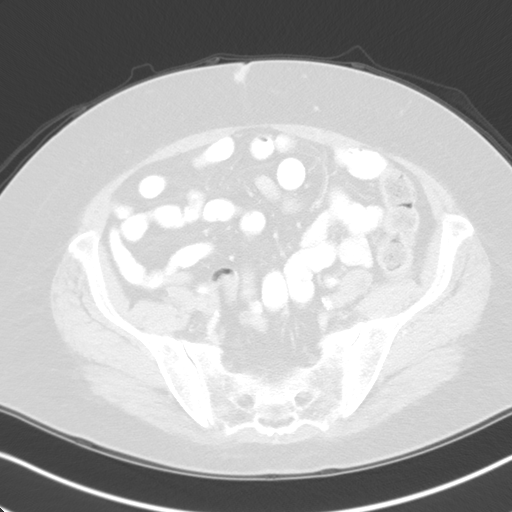
[im 44/121  lung]
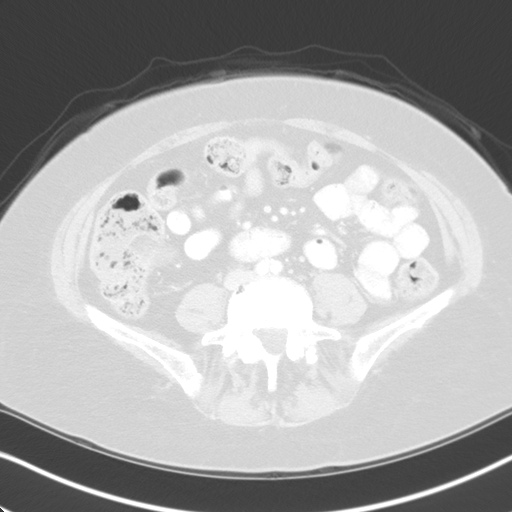
[im 55/121  mediastinal]
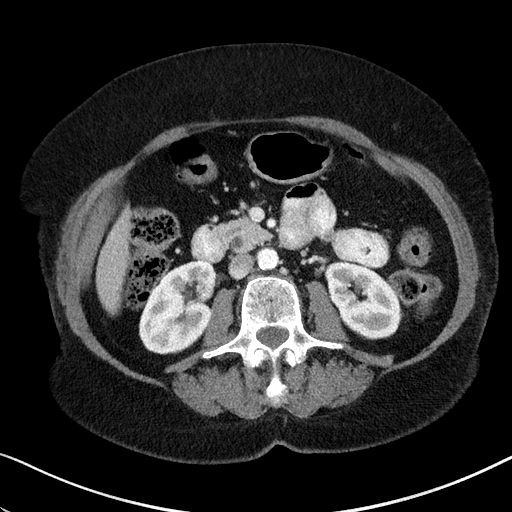
[im 55/121  lung]
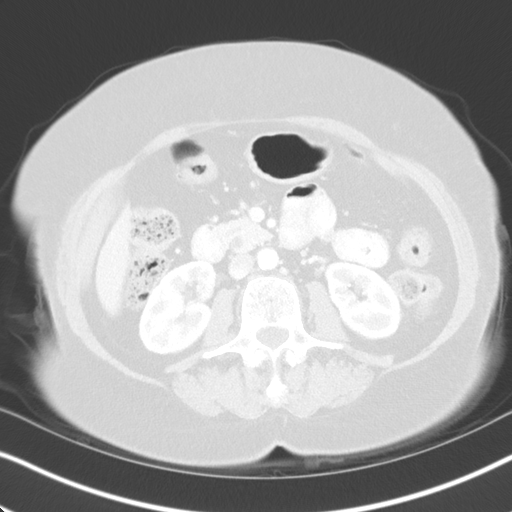
[im 59/121  lung]
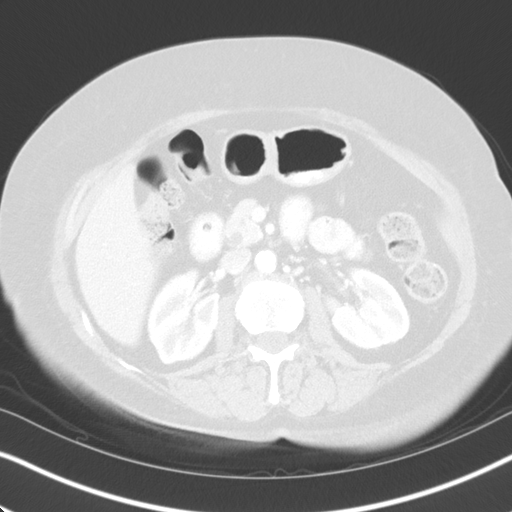
[im 66/121  lung]
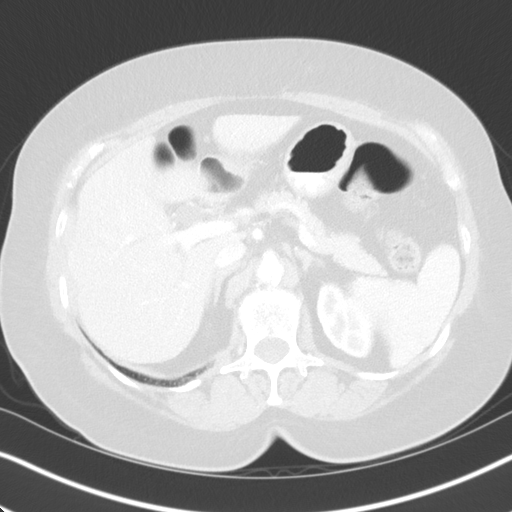
[im 77/121  lung]
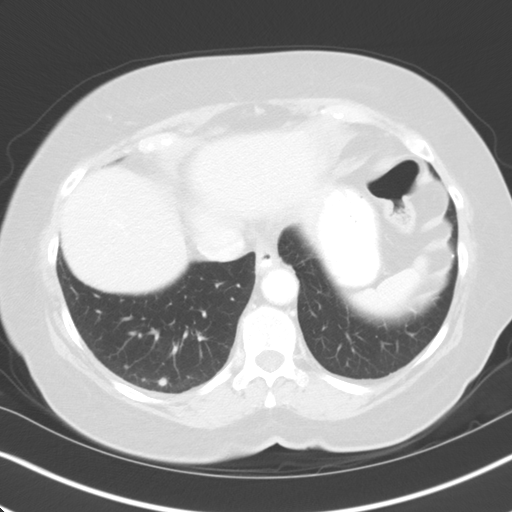
[im 88/121  mediastinal]
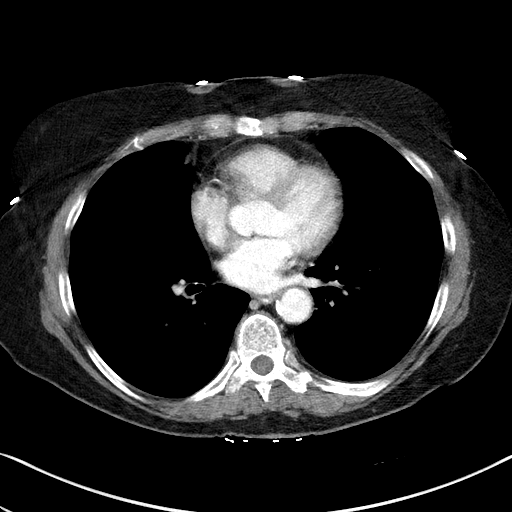
[im 88/121  lung]
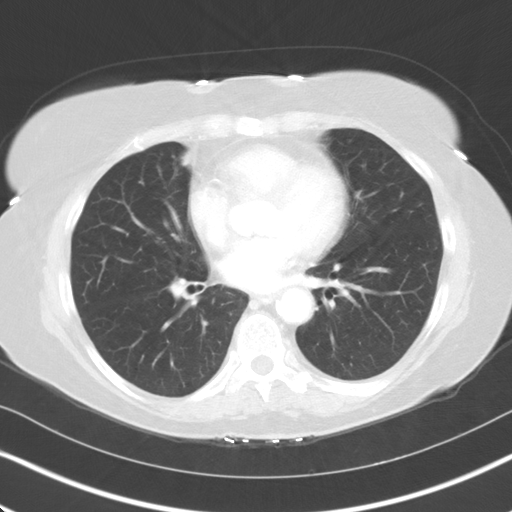
[im 99/121  lung]
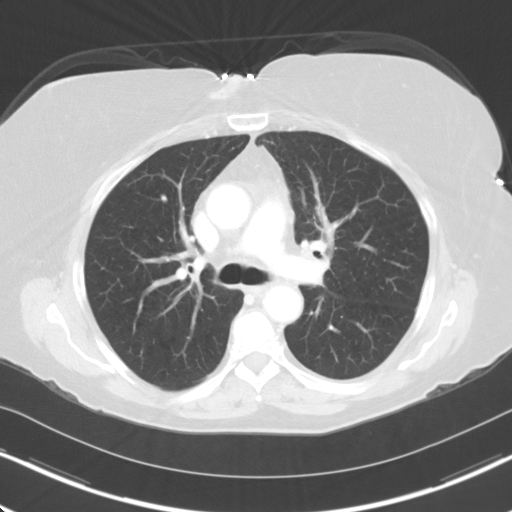
[im 110/121  lung]
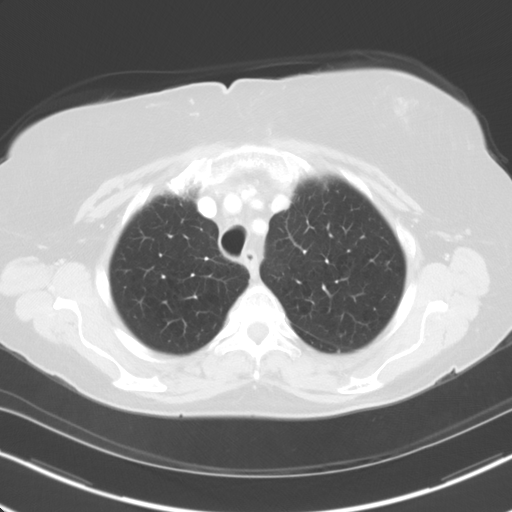

[11 of 30 positions shown; findings below may reference images not displayed]

FINDINGS: CT CHEST FINDINGS

Cardiovascular: Right Port-A-Cath tip high right atrium. Aortic
atherosclerosis. Tortuous thoracic aorta. Normal heart size, without
pericardial effusion. Multivessel coronary artery atherosclerosis.
No central pulmonary embolism, on this non-dedicated study.

Mediastinum/Nodes: No supraclavicular adenopathy. Hypoattenuating
left thyroid nodule of 8 mm is nonspecific.

Low right paratracheal node of 11 mm on [DATE] 103, 1.2 cm on the
prior.

Nodal versus central left upper lobe lung mass in the region of the
AP window measures 4.1 x 2.4 cm on [DATE] 103. Compare 4.5 x 2.6 cm on
the prior exam (when remeasured).

A necrotic left infrahilar node of 1.3 cm is decreased from 1.8 cm
on the prior exam (when remeasured).

Contrast level in the distal esophagus.

Lungs/Pleura: No pleural fluid. Moderate centrilobular emphysema.
Lower lobe predominant bronchial wall thickening.

Bilateral pulmonary nodules. These are decreased in size. For
example:

5 mm right upper lobe pulmonary nodule on 32/504, 7 mm on the prior
exam (when remeasured).

Anterior left upper lobe pulmonary nodule of 1.6 cm on 49/504,
decreased from 1.9 cm on the prior exam (when remeasured).

Right lower lobe 5 mm nodule on 99/504, 8 mm on 01/21/2019.

Musculoskeletal: No acute osseous abnormality.

CT ABDOMEN PELVIS FINDINGS

Hepatobiliary: Normal liver. Cholecystectomy. Upper normal common
duct caliber at 1.0 cm.

Pancreas: Suspect pancreas divisum, with a prominent dorsal duct
entering the duodenum on 68/3. No acute inflammation or duct
dilatation.

Spleen: Normal in size, without focal abnormality.

Adrenals/Urinary Tract: Normal adrenal glands. Too small to
characterize lesions in both kidneys. No hydronephrosis. Degraded
evaluation of the pelvis, secondary to beam hardening artifact from
bilateral hip arthroplasty. Grossly normal urinary bladder.

Stomach/Bowel: Normal stomach, without wall thickening. Colonic
stool burden suggests constipation. Normal terminal ileum. Normal
small bowel.

Vascular/Lymphatic: Aortic and branch vessel atherosclerosis. No
abdominal and no gross pelvic sidewall adenopathy.

Reproductive: Hysterectomy.  No adnexal mass.

Other: Moderate pelvic floor laxity.

Musculoskeletal: Bilateral hip arthroplasty. Lumbosacral spondylosis
with disc bulges including at L5-S1 and L3-4.
IMPRESSION: 1. Response to therapy of pulmonary lesions and thoracic adenopathy.
2. No new or progressive disease.
3. No acute process or evidence of metastatic disease in the abdomen
or pelvis.
4. Aortic atherosclerosis (GDSHI-BUW.W), coronary artery
atherosclerosis and emphysema (GDSHI-D5E.C).
5. Esophageal air fluid level suggests dysmotility or
gastroesophageal reflux.
6.  Possible constipation.

## 2020-05-28 IMAGING — CT CT ABD-PELV W/ CM
1 of 3 series · 11 of 32 positions shown, 17 images · IV contrast (omnipaque)
Comparison: Chest CT of 12/19/2018. Most recent abdominopelvic CT
of 01/21/2019.

CLINICAL DATA: Stage III left-sided lung cancer, treated with
radiation therapy and chemotherapy. Pre immunotherapy. Productive
cough shortness of breath. COPD. Gastroesophageal reflux disease.

EXAM:
CT CHEST, ABDOMEN, AND PELVIS WITH CONTRAST
TECHNIQUE: Multidetector CT imaging of the chest, abdomen and pelvis was
performed following the standard protocol during bolus
administration of intravenous contrast.
CONTRAST:  100mL OMNIPAQUE IOHEXOL 300 MG/ML  SOLN

[Series 3: cap with (person_name) · axial · 0.68mm/px · z∈[-891,-391]mm · 11 of 121 slices shown, 17 images]
[im 11/121  soft-tissue]
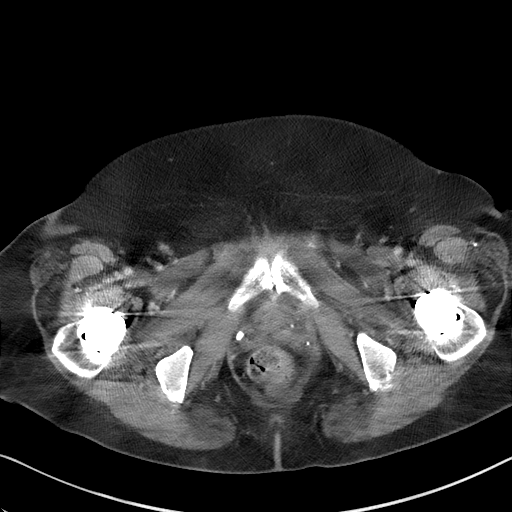
[im 11/121  bone]
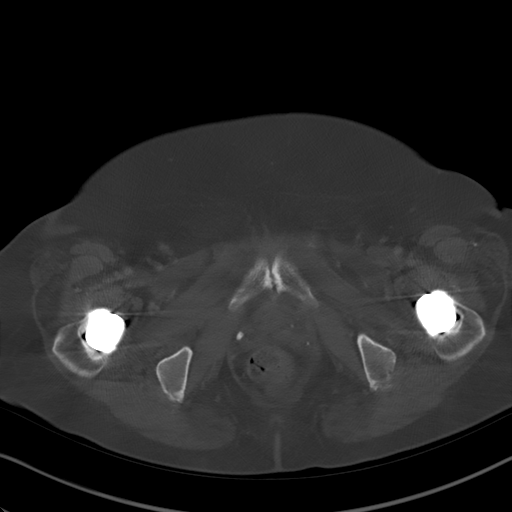
[im 21/121  soft-tissue]
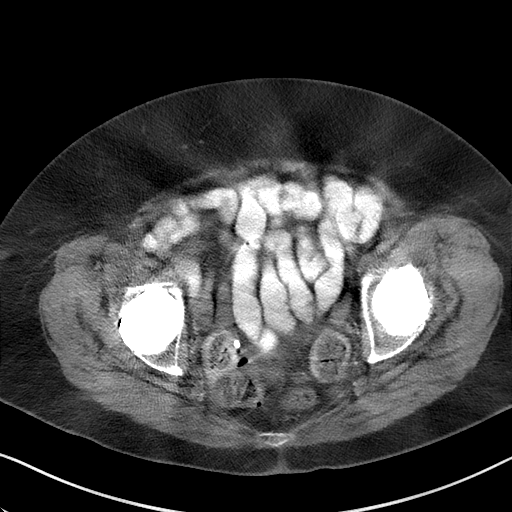
[im 31/121  soft-tissue]
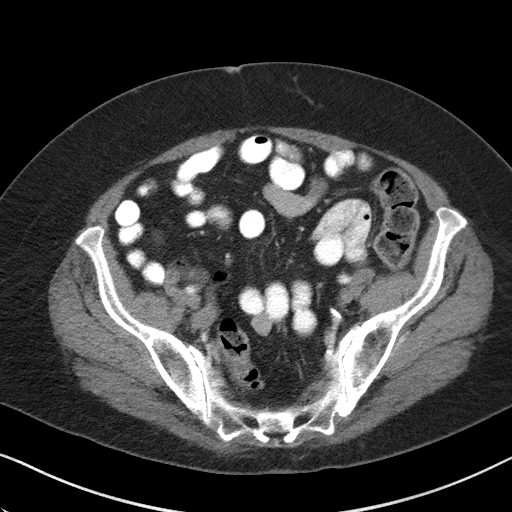
[im 41/121  soft-tissue]
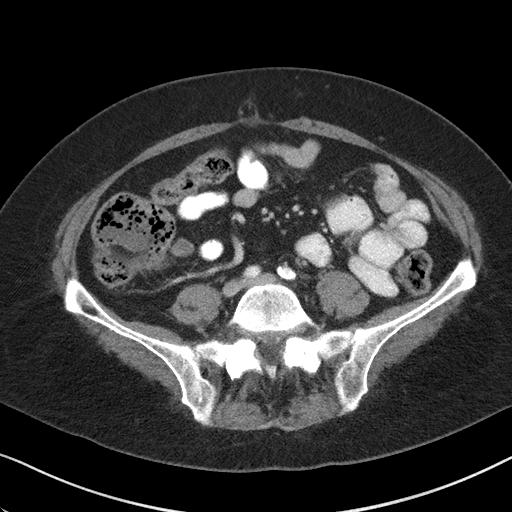
[im 51/121  soft-tissue]
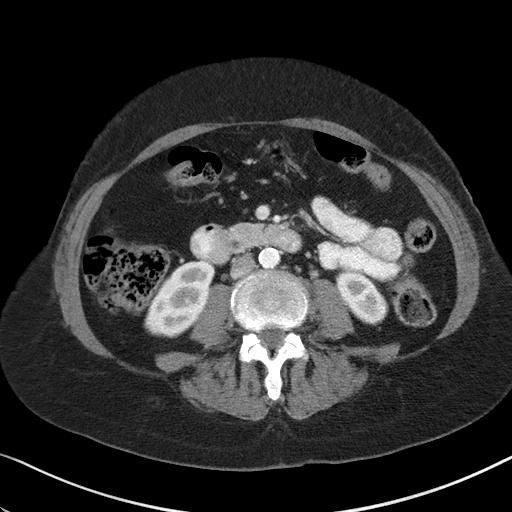
[im 61/121  soft-tissue]
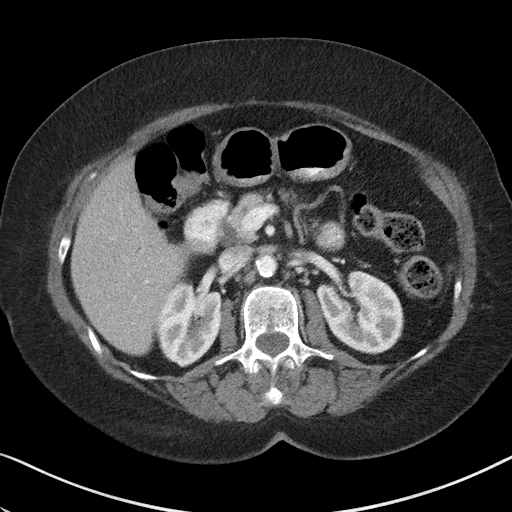
[im 71/121  soft-tissue]
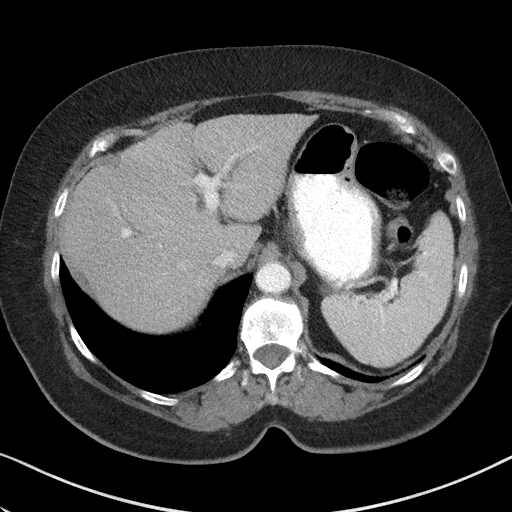
[im 81/121  soft-tissue]
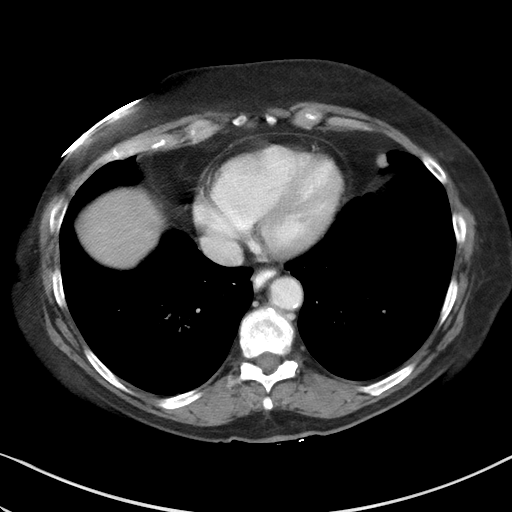
[im 81/121  lung]
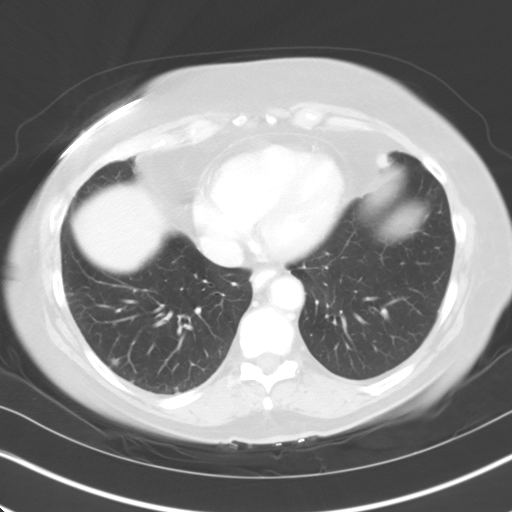
[im 91/121  soft-tissue]
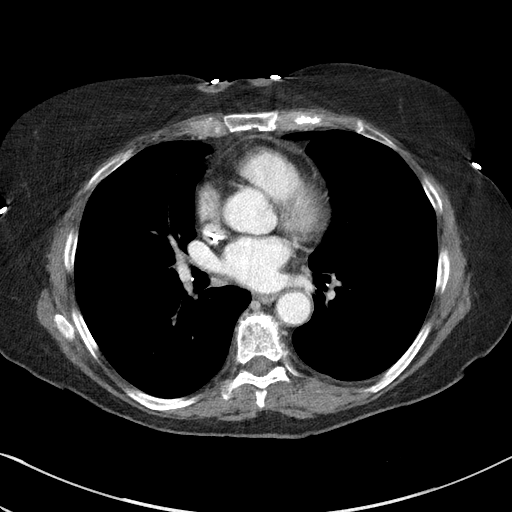
[im 91/121  lung]
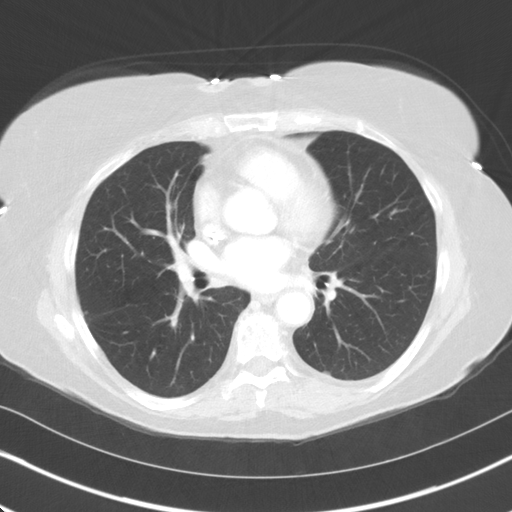
[im 91/121  bone]
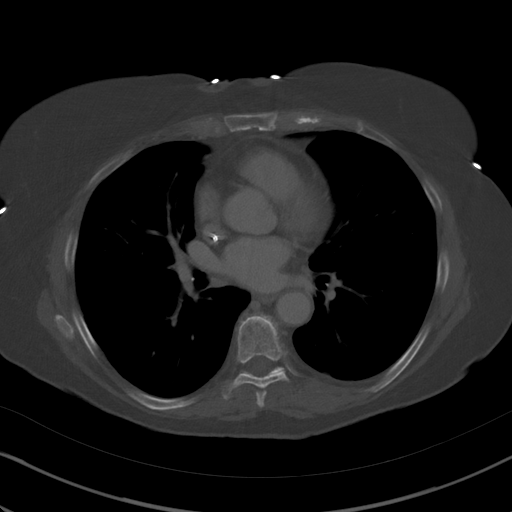
[im 101/121  soft-tissue]
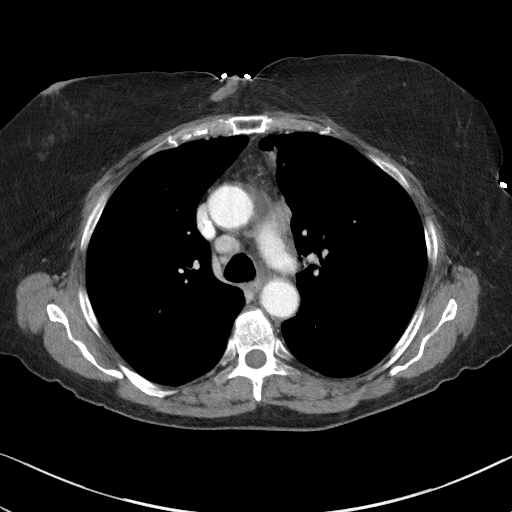
[im 101/121  lung]
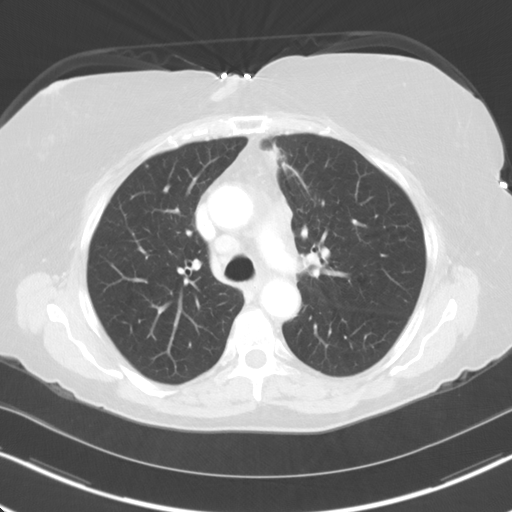
[im 111/121  soft-tissue]
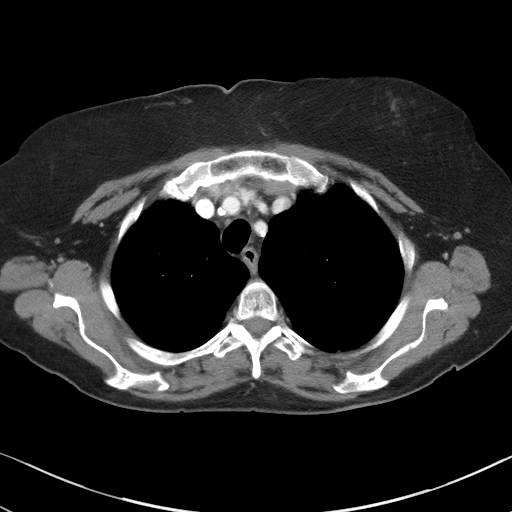
[im 111/121  lung]
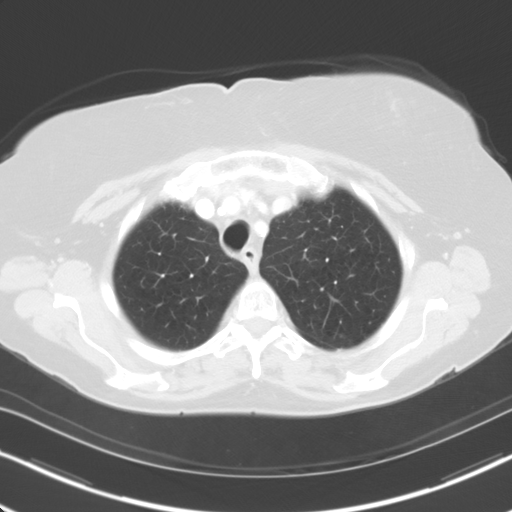

[11 of 32 positions shown; findings below may reference images not displayed]

FINDINGS: CT CHEST FINDINGS

Cardiovascular: Right Port-A-Cath tip high right atrium. Aortic
atherosclerosis. Tortuous thoracic aorta. Normal heart size, without
pericardial effusion. Multivessel coronary artery atherosclerosis.
No central pulmonary embolism, on this non-dedicated study.

Mediastinum/Nodes: No supraclavicular adenopathy. Hypoattenuating
left thyroid nodule of 8 mm is nonspecific.

Low right paratracheal node of 11 mm on [DATE] 103, 1.2 cm on the
prior.

Nodal versus central left upper lobe lung mass in the region of the
AP window measures 4.1 x 2.4 cm on [DATE] 103. Compare 4.5 x 2.6 cm on
the prior exam (when remeasured).

A necrotic left infrahilar node of 1.3 cm is decreased from 1.8 cm
on the prior exam (when remeasured).

Contrast level in the distal esophagus.

Lungs/Pleura: No pleural fluid. Moderate centrilobular emphysema.
Lower lobe predominant bronchial wall thickening.

Bilateral pulmonary nodules. These are decreased in size. For
example:

5 mm right upper lobe pulmonary nodule on 32/504, 7 mm on the prior
exam (when remeasured).

Anterior left upper lobe pulmonary nodule of 1.6 cm on 49/504,
decreased from 1.9 cm on the prior exam (when remeasured).

Right lower lobe 5 mm nodule on 99/504, 8 mm on 01/21/2019.

Musculoskeletal: No acute osseous abnormality.

CT ABDOMEN PELVIS FINDINGS

Hepatobiliary: Normal liver. Cholecystectomy. Upper normal common
duct caliber at 1.0 cm.

Pancreas: Suspect pancreas divisum, with a prominent dorsal duct
entering the duodenum on 68/3. No acute inflammation or duct
dilatation.

Spleen: Normal in size, without focal abnormality.

Adrenals/Urinary Tract: Normal adrenal glands. Too small to
characterize lesions in both kidneys. No hydronephrosis. Degraded
evaluation of the pelvis, secondary to beam hardening artifact from
bilateral hip arthroplasty. Grossly normal urinary bladder.

Stomach/Bowel: Normal stomach, without wall thickening. Colonic
stool burden suggests constipation. Normal terminal ileum. Normal
small bowel.

Vascular/Lymphatic: Aortic and branch vessel atherosclerosis. No
abdominal and no gross pelvic sidewall adenopathy.

Reproductive: Hysterectomy.  No adnexal mass.

Other: Moderate pelvic floor laxity.

Musculoskeletal: Bilateral hip arthroplasty. Lumbosacral spondylosis
with disc bulges including at L5-S1 and L3-4.
IMPRESSION: 1. Response to therapy of pulmonary lesions and thoracic adenopathy.
2. No new or progressive disease.
3. No acute process or evidence of metastatic disease in the abdomen
or pelvis.
4. Aortic atherosclerosis (GDSHI-BUW.W), coronary artery
atherosclerosis and emphysema (GDSHI-D5E.C).
5. Esophageal air fluid level suggests dysmotility or
gastroesophageal reflux.
6.  Possible constipation.

## 2020-06-04 ENCOUNTER — Other Ambulatory Visit: Payer: Self-pay | Admitting: Physician Assistant

## 2020-06-04 DIAGNOSIS — M549 Dorsalgia, unspecified: Secondary | ICD-10-CM

## 2020-06-08 ENCOUNTER — Inpatient Hospital Stay: Payer: Medicare Other | Attending: Oncology

## 2020-06-08 DIAGNOSIS — C3412 Malignant neoplasm of upper lobe, left bronchus or lung: Secondary | ICD-10-CM | POA: Insufficient documentation

## 2020-06-08 DIAGNOSIS — Z452 Encounter for adjustment and management of vascular access device: Secondary | ICD-10-CM | POA: Diagnosis not present

## 2020-06-08 DIAGNOSIS — Z95828 Presence of other vascular implants and grafts: Secondary | ICD-10-CM

## 2020-06-08 MED ORDER — HEPARIN SOD (PORK) LOCK FLUSH 100 UNIT/ML IV SOLN
INTRAVENOUS | Status: AC
  Filled 2020-06-08: qty 5

## 2020-06-08 MED ORDER — HEPARIN SOD (PORK) LOCK FLUSH 100 UNIT/ML IV SOLN
500.0000 [IU] | Freq: Once | INTRAVENOUS | Status: AC
  Administered 2020-06-08: 500 [IU] via INTRAVENOUS
  Filled 2020-06-08: qty 5

## 2020-06-08 MED ORDER — SODIUM CHLORIDE 0.9% FLUSH
10.0000 mL | Freq: Once | INTRAVENOUS | Status: AC
  Administered 2020-06-08: 10 mL via INTRAVENOUS
  Filled 2020-06-08: qty 10

## 2020-06-20 ENCOUNTER — Ambulatory Visit
Admission: RE | Admit: 2020-06-20 | Discharge: 2020-06-20 | Disposition: A | Discharge status: Home / Self Care | Payer: Medicare Other | Source: Ambulatory Visit | Attending: Physician Assistant | Admitting: Physician Assistant

## 2020-06-20 ENCOUNTER — Ambulatory Visit: Admission: RE | Admit: 2020-06-20 | Payer: Medicare Other | Source: Ambulatory Visit

## 2020-06-20 ENCOUNTER — Other Ambulatory Visit: Payer: Self-pay

## 2020-06-20 ENCOUNTER — Ambulatory Visit: Payer: Medicare Other

## 2020-06-20 DIAGNOSIS — M549 Dorsalgia, unspecified: Secondary | ICD-10-CM

## 2020-06-22 ENCOUNTER — Other Ambulatory Visit: Payer: Self-pay

## 2020-06-22 ENCOUNTER — Encounter: Payer: Self-pay | Admitting: Urology

## 2020-06-22 ENCOUNTER — Ambulatory Visit (INDEPENDENT_AMBULATORY_CARE_PROVIDER_SITE_OTHER): Payer: Medicare Other | Admitting: Urology

## 2020-06-22 VITALS — BP 123/83 | HR 76 | Ht 61.0 in | Wt 158.0 lb

## 2020-06-22 DIAGNOSIS — N3946 Mixed incontinence: Secondary | ICD-10-CM | POA: Diagnosis not present

## 2020-06-22 DIAGNOSIS — R32 Unspecified urinary incontinence: Secondary | ICD-10-CM | POA: Diagnosis not present

## 2020-06-22 NOTE — Progress Notes (Signed)
06/22/2020 10:04 AM   Jennifer Hodges Nov 17, 1945 [id number]  Referring provider: [name], MD [place],  [place] [address]  Chief Complaint  Patient presents with  . Cysto    HPI: I was consulted to assess the patient's voiding dysfunction.  She can leak with coughing sneezing.  She can have urge incontinence.  She describes foot on the floor syndrome.  She has mild bedwetting.  Most days she does not wear a pad.  She describes a prolapse repair and bladder suspension by gynecology and Dr. [name] about 6 years ago.  She describes emptying her bladder that he wants to keep an eye on like a cyst.  Her flow is sometimes poor sometimes good.  She has had a hysterectomy.  She is prone to constipation  I reviewed the medical records and the symptoms in 2018 were very similar with mild intermittent mixed incontinence generally not wearing a pad.  There was no reference of anything abnormal in the bladder.  She was using some local estrogen cream at the meatus.  She has had radiation and chemotherapy and is lung cancer free currently.   Return in 3-5 weeks for cystoscopy for baseline looking bladder.  Call if urine culture is positive.  I think reassurance will be helpful.  I will check for any foreign body in the bladder and any vaginal issues  Today Frequency stable.  Last culture negative She does have mild mixed incontinence that is stable.  She does not want medication.  She sometimes wears a pad especially at night On physical examination she had a large grade 2 or mild grade 3 cystocele that just reached the introitus.  She lost 2 to 3 cm of vaginal length.  No stress incontinence  Cystoscopy: Patient underwent flexible cystoscopy.  Bladder close and trigone were normal.  No stitch foreign body or carcinoma.  I had a careful look around the bladder and saw no abnormalities.     PMH: Past Medical History:  Diagnosis Date  . Anemia   . Asthma    No  Inhalers--Dr. [name] will order as needed  . Bronchiectasis (HCC)    mild  . Chronic headaches     followed by [facility]  . COPD (chronic obstructive pulmonary disease) ([facility])   . DDD (degenerative disc disease), lumbar   . Diabetes mellitus without complication [facility])    "doctor says I no longer have diabetes"    . Diverticulosis   . Family history of adverse reaction to anesthesia    PONV  . Gall stones    history of  . GERD (gastroesophageal reflux disease)    EGD 8/09- non bleeding erosive gastritis, documentd esophageal ulcerations.   . Hiatal hernia   . History of pneumonia   . Hypercholesterolemia   . IBS (irritable bowel syndrome)   . Malignant neoplasm of upper lobe of left lung ([facility]) 12/27/2018  . Meniere disease   . Murmur   . Osteoarthritis    lumbar disc disease, left hip  . Personal history of chemotherapy   . Personal history of radiation therapy   . PONV (postoperative nausea and vomiting)    "Only with last hip and I believe it was due to the morphine"   . Sleep apnea    cpap asked to bring mask and tubing  . Vertigo   . Weakness of right side of body     Surgical History: Past Surgical History:  Procedure Laterality Date  .  ABDOMINAL HYSTERECTOMY  age 2  . ANTERIOR CERVICAL DECOMP/DISCECTOMY FUSION N/A 02/24/2015   Procedure: CERVICAL FOUR-FIVE, CERVICAL FIVE-SIX, CERVICAL SIX-SEVEN ANTERIOR CERVICAL DECOMPRESSION/DISCECTOMY FUSION ;  Surgeon: [name], MD;  Location: [facility]URO ORS;  Service: Neurosurgery;  Laterality: N/A;  C45 C56 C67 anterior cervical decompression with fusion interbody prosthesis plating and bonegraft  . APPENDECTOMY    . BACK SURGERY     4th lumbar fusion  . BLADDER SURGERY N/A    with vaginal wall repair  . BREAST CYST ASPIRATION Bilateral    neg  . BREAST SURGERY Bilateral    cyst removed and reduction  . CARDIAC CATHETERIZATION  2014  . CARPAL TUNNEL RELEASE Right 02/11/2016   Procedure: CARPAL  TUNNEL RELEASE;  Surgeon: [name], MD;  Location: [facility] ORS;  Service: Orthopedics;  Laterality: Right;  . CATARACT EXTRACTION W/ INTRAOCULAR LENS IMPLANT Bilateral 2015  . CHOLECYSTECTOMY    . EUS N/A 05/31/2012   Procedure: UPPER ENDOSCOPIC ULTRASOUND (EUS) LINEAR;  Surgeon: [name], MD;  Location: [facility] ENDOSCOPY;  Service: Endoscopy;  Laterality: N/A;  . EXCISIONAL HEMORRHOIDECTOMY    . JOINT REPLACEMENT Bilateral   . KNEE ARTHROSCOPY WITH LATERAL MENISECTOMY Right 07/07/2015   Procedure: KNEE ARTHROSCOPY WITH LATERAL MENISECTOMY, PARTIAL SYNOVECTOMY;  Surgeon: [name], MD;  Location: [facility] ORS;  Service: Orthopedics;  Laterality: Right;  . LUMBAR LAMINECTOMY    . PORTA CATH INSERTION N/A 01/07/2019   Procedure: PORTA CATH INSERTION;  Surgeon: [name], MD;  Location: [facility] CV LAB;  Service: Cardiovascular;  Laterality: N/A;  . REDUCTION MAMMAPLASTY  1990  . RIGHT OOPHORECTOMY    . TOTAL HIP ARTHROPLASTY Left 05/01/2014   Dr. [name]  . TOTAL HIP ARTHROPLASTY Right 08/05/2014   Procedure: TOTAL HIP ARTHROPLASTY ANTERIOR APPROACH;  Surgeon: [name], MD;  Location: [facility] ORS;  Service: Orthopedics;  Laterality: Right;  . ULNAR NERVE TRANSPOSITION Right 02/11/2016   Procedure: ULNAR NERVE DECOMPRESSION/TRANSPOSITION;  Surgeon: [name], MD;  Location: [facility] ORS;  Service: Orthopedics;  Laterality: Right;  Marland Kitchen VIDEO BRONCHOSCOPY WITH ENDOBRONCHIAL ULTRASOUND Left 12/19/2018   Procedure: VIDEO BRONCHOSCOPY WITH ENDOBRONCHIAL ULTRASOUND, LEFT, SLEEP APNEA;  Surgeon: [name], MD;  Location: [facility] ORS;  Service: Thoracic;  Laterality: Left;    Home Medications:  Allergies as of 06/22/2020      Reactions   Aspirin Hives, Other (See Comments)   Difficulty breathing   Celebrex [celecoxib] Shortness Of Breath   Morphine And Related Nausea And Vomiting, Swelling   Adhesive [tape] Other (See Comments)   Took top layer of skin off when removed.    Clarithromycin Nausea And Vomiting   Codeine Nausea And Vomiting   Darvon [propoxyphene Hcl] Nausea And Vomiting   Demerol [meperidine] Nausea And Vomiting   Flonase [fluticasone Propionate] Other (See Comments)   Fungal infection   Simvastatin Other (See Comments)   "caused ulcers in mouth, and fever"   Talwin [pentazocine] Nausea And Vomiting      Medication List       Accurate as of June 22, 2020 10:04 AM. If you have any questions, ask your nurse or doctor.        albuterol (2.5 MG/3ML) 0.083% nebulizer solution Commonly known as: PROVENTIL Take 2.5 mg by nebulization every 6 (six) hours as needed for wheezing.   albuterol 108 (90 Base) MCG/ACT inhaler Commonly known as: VENTOLIN HFA Inhale 2 puffs into the lungs every 6 (six) hours as needed for wheezing or shortness of breath.  AUSTRALIAN DREAM ARTHRITIS EX Apply 1 application topically 4 (four) times daily as needed (pain.).   CAL-MAG-ZINC PO Take 1 tablet by mouth daily.   cetirizine 5 MG tablet Commonly known as: ZYRTEC Take 1 tablet (5 mg total) by mouth daily. What changed:   when to take this  reasons to take this   Chloraseptic Max Sore Throat 1.5-33 % Liqd Generic drug: Phenol-Glycerin Use as directed 1 spray in the mouth or throat as needed (throat irritation.).   Clotrimazole 1 % Oint Apply 1 application topically in the morning, at noon, and at bedtime.   Co Q 10 100 MG Caps Take 100 mg by mouth daily.   cyclobenzaprine 10 MG tablet Commonly known as: FLEXERIL Take 10 mg by mouth at bedtime.   DHEA 25 MG Caps Take 25 mg by mouth daily.   Ensure Take 237 mLs by mouth 2 (two) times daily between meals.   EQL Vitamin D3 25 MCG (1000 UT) capsule Generic drug: Cholecalciferol Take 4,000 Units by mouth daily.   ESTER C PO Take 1 tablet by mouth 3 (three) times daily.   ezetimibe 10 MG tablet Commonly known as: ZETIA Take 5 mg by mouth in the morning and at bedtime.   folic acid 993  MCG tablet Commonly known as: FOLVITE Take 400 mcg by mouth 2 (two) times daily.   Garlic 5701 MG Caps Take 1,000 mg by mouth daily.   guaiFENesin-dextromethorphan 100-10 MG/5ML syrup Commonly known as: ROBITUSSIN DM Take 5 mLs by mouth every 4 (four) hours as needed for cough.   HYDROcodone-acetaminophen 5-325 MG tablet Commonly known as: NORCO/VICODIN 1-2 tabs po bid prn What changed:   how much to take  how to take this  when to take this  additional instructions   HYDROcodone-homatropine 5-1.5 MG/5ML syrup Commonly known as: HYCODAN Take 5 mLs by mouth 3 (three) times daily as needed for cough.   ipratropium 0.03 % nasal spray Commonly known as: ATROVENT SMARTSIG:1-2 Spray(s) Both Nares 3 Times Daily PRN   lidocaine-prilocaine cream Commonly known as: EMLA lidocaine-prilocaine 2.5 %-2.5 % topical cream   meclizine 25 MG tablet Commonly known as: ANTIVERT Take 1 tablet (25 mg total) by mouth every 6 (six) hours as needed for dizziness.   montelukast 10 MG tablet Commonly known as: SINGULAIR Take 10 mg by mouth at bedtime.   ondansetron 4 MG disintegrating tablet Commonly known as: ZOFRAN-ODT Take 4 mg by mouth every 8 (eight) hours as needed for nausea/vomiting.   pyridOXINE 100 MG tablet Commonly known as: VITAMIN B-6 Take 100 mg by mouth daily.   RECLAST IV Inject 1 Dose into the vein as directed. ONCE A YEAR   Selenium 200 MCG Caps Take 200 mcg by mouth daily.   sucralfate 1 g tablet Commonly known as: CARAFATE 1 tablet 3 (three) times daily as needed.   Systane 0.4-0.3 % Soln Generic drug: Polyethyl Glycol-Propyl Glycol Place 1 drop into both eyes 5 (five) times daily as needed (dry/irritated eyes).   triamcinolone cream 0.1 % Commonly known as: KENALOG Apply 1 application topically 2 (two) times daily.   Ubrelvy 100 MG Tabs Generic drug: Ubrogepant Take by mouth.   Vitamin A 2250 MCG (7500 UT) Caps Take 1 capsule by mouth daily.    Vitamin B-12 2500 MCG Subl Place 2,500 mcg under the tongue daily.   vitamin E 180 MG (400 UNITS) capsule Take 400 Units by mouth daily.       Allergies:  Allergies  Allergen Reactions  . Aspirin Hives and Other (See Comments)    Difficulty breathing  . Celebrex [Celecoxib] Shortness Of Breath  . Morphine And Related Nausea And Vomiting and Swelling  . Adhesive [Tape] Other (See Comments)    Took top layer of skin off when removed.  . Clarithromycin Nausea And Vomiting  . Codeine Nausea And Vomiting  . Darvon [Propoxyphene Hcl] Nausea And Vomiting  . Demerol [Meperidine] Nausea And Vomiting  . Flonase [Fluticasone Propionate] Other (See Comments)    Fungal infection  . Simvastatin Other (See Comments)    "caused ulcers in mouth, and fever"  . Talwin [Pentazocine] Nausea And Vomiting    Family History: Family History  Problem Relation Age of Onset  . Heart disease Mother        s/p stent  . Hypertension Mother   . Hypercholesterolemia Mother   . Diabetes Father   . Stomach cancer Other        uncle  . Breast cancer Cousin   . Breast cancer Paternal Aunt     Social History:  reports that she quit smoking about 13 years ago. She has never used smokeless tobacco. She reports that she does not drink alcohol and does not use drugs.  ROS:                                        Physical Exam: There were no vitals taken for this visit.  Constitutional:  Alert and oriented, No acute distress.   Laboratory Data: Lab Results  Component Value Date   WBC 4.0 04/30/2020   HGB 11.8 (L) 04/30/2020   HCT 34.8 (L) 04/30/2020   MCV 89.9 04/30/2020   PLT 183 04/30/2020    Lab Results  Component Value Date   CREATININE 0.37 (L) 04/30/2020    No results found for: PSA  No results found for: TESTOSTERONE  Lab Results  Component Value Date   HGBA1C 5.7 (H) 02/18/2015    Urinalysis    Component Value Date/Time   COLORURINE YELLOW (A)  01/30/2019 1315   APPEARANCEUR Clear 05/25/2020 0924   LABSPEC 1.012 01/30/2019 1315   PHURINE 6.0 01/30/2019 1315   GLUCOSEU Negative 05/25/2020 0924   GLUCOSEU NEGATIVE 06/19/2014 [place] 01/30/2019 1315   BILIRUBINUR Negative 05/25/2020 [place] 01/30/2019 1315   PROTEINUR Negative 05/25/2020 0924   PROTEINUR NEGATIVE 01/30/2019 1315   UROBILINOGEN 1.0 03/04/2015 1339   UROBILINOGEN 0.2 06/19/2014 1002   NITRITE Negative 05/25/2020 0924   NITRITE NEGATIVE 01/30/2019 1315   LEUKOCYTESUR Negative 05/25/2020 0924   LEUKOCYTESUR NEGATIVE 01/30/2019 1315    Pertinent Imaging:   Assessment & Plan: Reassurance given.  Watchful waiting for prolapse.  See as needed  1. Urinary incontinence, unspecified type  - Urinalysis, Complete   No follow-ups on file.  Reece Packer, MD  [facility] [address], [place], [place] [address] [phone]

## 2020-06-23 LAB — URINALYSIS, COMPLETE
Bilirubin, UA: NEGATIVE
Glucose, UA: NEGATIVE
Ketones, UA: NEGATIVE
Leukocytes,UA: NEGATIVE
Nitrite, UA: NEGATIVE
Protein,UA: NEGATIVE
RBC, UA: NEGATIVE
Specific Gravity, UA: 1.01 (ref 1.005–1.030)
Urobilinogen, Ur: 0.2 mg/dL (ref 0.2–1.0)
pH, UA: 6.5 (ref 5.0–7.5)

## 2020-06-23 LAB — MICROSCOPIC EXAMINATION
Bacteria, UA: NONE SEEN
RBC, Urine: NONE SEEN /hpf (ref 0–2)

## 2020-07-01 ENCOUNTER — Telehealth: Payer: Self-pay | Admitting: *Deleted

## 2020-07-01 NOTE — Telephone Encounter (Signed)
called the pt. And got her voicemail and left message that she can get her port flushed every 12 weeks. She had her last flush 3/28 and she has an appt for 5/17 for port flush - it will only be 7 weeks and if she wants to we can change the appt to just labs and no port flush or keep port flush and start 12 weeks after her may appt. Just to call me if she wants changes to already scheduled appt

## 2020-07-01 NOTE — Telephone Encounter (Signed)
Can space it out to q3 months

## 2020-07-01 NOTE — Telephone Encounter (Signed)
Patient called asking if her port flushes need to be every 28 day or if it can be spaced out longer. Please advise

## 2020-07-02 ENCOUNTER — Telehealth: Payer: Self-pay | Admitting: *Deleted

## 2020-07-02 NOTE — Telephone Encounter (Signed)
Pt called and left message that she got my message and she will get the port flush in may which is her next visit and then we will make every 12 week appt for port flush.

## 2020-07-20 ENCOUNTER — Other Ambulatory Visit: Payer: Self-pay

## 2020-07-20 ENCOUNTER — Ambulatory Visit
Admission: RE | Admit: 2020-07-20 | Discharge: 2020-07-20 | Disposition: A | Discharge status: Home / Self Care | Payer: Medicare Other | Source: Ambulatory Visit | Attending: Oncology | Admitting: Oncology

## 2020-07-20 DIAGNOSIS — C3412 Malignant neoplasm of upper lobe, left bronchus or lung: Secondary | ICD-10-CM | POA: Diagnosis present

## 2020-07-20 MED ORDER — IOHEXOL 300 MG/ML  SOLN
100.0000 mL | Freq: Once | INTRAMUSCULAR | Status: AC | PRN
  Administered 2020-07-20: 100 mL via INTRAVENOUS

## 2020-07-28 ENCOUNTER — Other Ambulatory Visit: Payer: Self-pay | Admitting: *Deleted

## 2020-07-28 ENCOUNTER — Other Ambulatory Visit: Payer: Self-pay

## 2020-07-28 ENCOUNTER — Encounter: Payer: Self-pay | Admitting: Oncology

## 2020-07-28 ENCOUNTER — Inpatient Hospital Stay (HOSPITAL_BASED_OUTPATIENT_CLINIC_OR_DEPARTMENT_OTHER): Payer: Medicare Other | Admitting: Oncology

## 2020-07-28 ENCOUNTER — Inpatient Hospital Stay: Payer: Medicare Other | Attending: Oncology

## 2020-07-28 VITALS — BP 113/62 | HR 78 | Temp 97.2°F | Wt 162.3 lb

## 2020-07-28 DIAGNOSIS — Z452 Encounter for adjustment and management of vascular access device: Secondary | ICD-10-CM | POA: Diagnosis not present

## 2020-07-28 DIAGNOSIS — Z85118 Personal history of other malignant neoplasm of bronchus and lung: Secondary | ICD-10-CM

## 2020-07-28 DIAGNOSIS — C3412 Malignant neoplasm of upper lobe, left bronchus or lung: Secondary | ICD-10-CM

## 2020-07-28 DIAGNOSIS — Z08 Encounter for follow-up examination after completed treatment for malignant neoplasm: Secondary | ICD-10-CM

## 2020-07-28 LAB — CBC WITH DIFFERENTIAL/PLATELET
Abs Immature Granulocytes: 0.01 10*3/uL (ref 0.00–0.07)
Basophils Absolute: 0 10*3/uL (ref 0.0–0.1)
Basophils Relative: 0 %
Eosinophils Absolute: 0.1 10*3/uL (ref 0.0–0.5)
Eosinophils Relative: 3 %
HCT: 35.6 % — ABNORMAL LOW (ref 36.0–46.0)
Hemoglobin: 12.1 g/dL (ref 12.0–15.0)
Immature Granulocytes: 0 %
Lymphocytes Relative: 22 %
Lymphs Abs: 0.9 10*3/uL (ref 0.7–4.0)
MCH: 31.8 pg (ref 26.0–34.0)
MCHC: 34 g/dL (ref 30.0–36.0)
MCV: 93.4 fL (ref 80.0–100.0)
Monocytes Absolute: 0.3 10*3/uL (ref 0.1–1.0)
Monocytes Relative: 9 %
Neutro Abs: 2.5 10*3/uL (ref 1.7–7.7)
Neutrophils Relative %: 66 %
Platelets: 206 10*3/uL (ref 150–400)
RBC: 3.81 MIL/uL — ABNORMAL LOW (ref 3.87–5.11)
RDW: 12.6 % (ref 11.5–15.5)
WBC: 3.9 10*3/uL — ABNORMAL LOW (ref 4.0–10.5)
nRBC: 0 % (ref 0.0–0.2)

## 2020-07-28 LAB — COMPREHENSIVE METABOLIC PANEL
ALT: 14 U/L (ref 0–44)
AST: 24 U/L (ref 15–41)
Albumin: 4.1 g/dL (ref 3.5–5.0)
Alkaline Phosphatase: 58 U/L (ref 38–126)
Anion gap: 8 (ref 5–15)
BUN: 14 mg/dL (ref 8–23)
CO2: 27 mmol/L (ref 22–32)
Calcium: 9 mg/dL (ref 8.9–10.3)
Chloride: 102 mmol/L (ref 98–111)
Creatinine, Ser: 0.44 mg/dL (ref 0.44–1.00)
GFR, Estimated: >60 mL/min (ref >60)
Glucose, Bld: 115 mg/dL — ABNORMAL HIGH (ref 70–99)
Potassium: 4 mmol/L (ref 3.5–5.1)
Sodium: 137 mmol/L (ref 135–145)
Total Bilirubin: 0.7 mg/dL (ref 0.3–1.2)
Total Protein: 6.5 g/dL (ref 6.5–8.1)

## 2020-07-28 MED ORDER — HEPARIN SOD (PORK) LOCK FLUSH 100 UNIT/ML IV SOLN
500.0000 [IU] | Freq: Once | INTRAVENOUS | Status: AC
  Administered 2020-07-28: 500 [IU] via INTRAVENOUS
  Filled 2020-07-28: qty 5

## 2020-07-28 MED ORDER — SODIUM CHLORIDE 0.9% FLUSH
10.0000 mL | INTRAVENOUS | Status: DC | PRN
  Administered 2020-07-28: 10 mL via INTRAVENOUS
  Filled 2020-07-28: qty 10

## 2020-07-28 NOTE — Progress Notes (Signed)
Hematology/Oncology Consult note [facility]  [phone] [phone]  Patient Care Team: Marina Goodell, MD as PCP - General (Family Medicine) Glory Buff, RN as Registered Nurse   Name of the patient: Jennifer Hodges  [id number]  [birth year]   Date of visit: 07/28/20  Diagnosis- Adenocarcinoma of the lung stage III T1b N2 M0  Chief complaint/ Reason for visit-discuss CT scan results  Heme/Onc history: patient is a 75 year old female who was seen by pulmonary and ENT for ongoing symptoms of bronchitis and possible laryngitis and received antibiotics for the same.She underwent CT chest in September 2020 which showed multiple bilateral lung nodules with associated mediastinal adenopathy and possible primary left upper lobe lung mass. This was followed by a PET CT scan which showed a left upper lobe lung mass measuring 1.9 x 1.2 cm with an SUV of 10.7. Conglomerate AP window adenopathy measuring 5.8 x 2.1 cm with an SUV of 14. She was also noted to have hypermetabolic left hilar adenopathy and left supraclavicular 0.7 cm lymph node with an SUV of 4.6. Noted to have a 7 mm right lower lobe lung nodule with faint metabolic activity. No evidence of other distant metastatic disease. This was followed by a CT super D chest without contrast. The right lower lobe lung nodules were somewhat larger as compared to September 2020. 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 Maintenance  durvalumab started in January 2021after scan showed partial response.  Patient completed 1 year of maintenance durvalumab in February 2022  Interval history-patient reports doing well.  Denies any specific complaints at this time.  Vertigo symptoms are under good control.  She has some ongoing chronic back pain for which she underwent MRI which did not show any evidence of metastatic disease ECOG PS- 1 Pain scale- 0   Review of systems- Review of Systems  Constitutional: Positive for malaise/fatigue. Negative for chills, fever and weight loss.  HENT: Negative for congestion, ear discharge and nosebleeds.   Eyes: Negative for blurred vision.  Respiratory: Negative for cough, hemoptysis, sputum production, shortness of breath and wheezing.   Cardiovascular: Negative for chest pain, palpitations, orthopnea and claudication.  Gastrointestinal: Negative for abdominal pain, blood in stool, constipation, diarrhea, heartburn, melena, nausea and vomiting.  Genitourinary: Negative for dysuria, flank pain, frequency, hematuria and urgency.  Musculoskeletal: Positive for back pain. Negative for joint pain and myalgias.  Skin: Negative for rash.  Neurological: Negative for dizziness, tingling, focal weakness, seizures, weakness and headaches.  Endo/Heme/Allergies: Does not bruise/bleed easily.  Psychiatric/Behavioral: Negative for depression and suicidal ideas. The patient does not have insomnia.     Allergies  Allergen Reactions  . Aspirin Hives and Other (See Comments)    Difficulty breathing  . Celebrex [Celecoxib] Shortness Of Breath  . Morphine And Related Nausea And Vomiting and Swelling  . Adhesive [Tape] Other (See Comments)    Took top layer of skin off when removed.  . Clarithromycin Nausea And Vomiting  . Codeine Nausea And Vomiting  . Darvon [Propoxyphene Hcl] Nausea And Vomiting  . Demerol [Meperidine] Nausea And Vomiting  . Flonase [Fluticasone Propionate] Other (See Comments)  Fungal infection  . Simvastatin Other (See Comments)    "caused ulcers in mouth, and fever"  . Talwin [Pentazocine] Nausea And Vomiting     Past Medical History:  Diagnosis Date  . Anemia   . Asthma    No Inhalers--Dr. [name] will order as needed  . Bronchiectasis (HCC)    mild  . Chronic headaches     followed by [facility] migraines  . COPD (chronic obstructive pulmonary disease) ([place])   . DDD (degenerative disc disease), lumbar   . Diverticulosis   . Family history of adverse reaction to anesthesia    PONV  . Gall stones    history of  . GERD (gastroesophageal reflux disease)    EGD 8/09- non bleeding erosive gastritis, documentd esophageal ulcerations.   . Hiatal hernia   . History of pneumonia   . Hypercholesterolemia   . IBS (irritable bowel syndrome)   . Malignant neoplasm of upper lobe of left lung ([place]) 12/27/2018  . Meniere disease   . Murmur   . Osteoarthritis    lumbar disc disease, left hip  . Personal history of chemotherapy   . Personal history of radiation therapy   . PONV (postoperative nausea and vomiting)    "Only with last hip and I believe it was due to the morphine"   . Sleep apnea    cpap asked to bring mask and tubing  . Vertigo   . Weakness of right side of body      Past Surgical History:  Procedure Laterality Date  . ABDOMINAL HYSTERECTOMY  age 55  . ANTERIOR CERVICAL DECOMP/DISCECTOMY FUSION N/A 02/24/2015   Procedure: CERVICAL FOUR-FIVE, CERVICAL FIVE-SIX, CERVICAL SIX-SEVEN ANTERIOR CERVICAL DECOMPRESSION/DISCECTOMY FUSION ;  Surgeon: [name], MD;  Location: [facility];  Service: Neurosurgery;  Laterality: N/A;  C45 C56 C67 anterior cervical decompression with fusion interbody prosthesis plating and bonegraft  . APPENDECTOMY    . BACK SURGERY     4th lumbar fusion  . BLADDER SURGERY N/A    with vaginal wall repair  . BREAST CYST ASPIRATION Bilateral    neg  . BREAST SURGERY Bilateral    cyst removed and reduction   . CARDIAC CATHETERIZATION  2014  . CARPAL TUNNEL RELEASE Right 02/11/2016   Procedure: CARPAL TUNNEL RELEASE;  Surgeon: [name], MD;  Location: [facility];  Service: Orthopedics;  Laterality: Right;  . CATARACT EXTRACTION W/ INTRAOCULAR LENS IMPLANT Bilateral 2015  . CHOLECYSTECTOMY    . EUS N/A 05/31/2012   Procedure: UPPER ENDOSCOPIC ULTRASOUND (EUS) LINEAR;  Surgeon: [name], MD;  Location: [facility];  Service: Endoscopy;  Laterality: N/A;  . EXCISIONAL HEMORRHOIDECTOMY    . JOINT REPLACEMENT Bilateral   . KNEE ARTHROSCOPY WITH LATERAL MENISECTOMY Right 07/07/2015   Procedure: KNEE ARTHROSCOPY WITH LATERAL MENISECTOMY, PARTIAL SYNOVECTOMY;  Surgeon: [name], MD;  Location: [facility];  Service: Orthopedics;  Laterality: Right;  . LUMBAR LAMINECTOMY    . PORTA CATH INSERTION N/A 01/07/2019   Procedure: PORTA CATH INSERTION;  Surgeon: [name], MD;  Location: [place] [facility];  Service: Cardiovascular;  Laterality: N/A;  . REDUCTION MAMMAPLASTY  1990  . RIGHT OOPHORECTOMY    . TOTAL HIP ARTHROPLASTY Left 05/01/2014   Dr. [name]  . TOTAL HIP ARTHROPLASTY Right 08/05/2014   Procedure: TOTAL HIP ARTHROPLASTY ANTERIOR APPROACH;  Surgeon: [name], MD;  Location: [facility];  Service: Orthopedics;  Laterality: Right;  . ULNAR NERVE TRANSPOSITION Right 02/11/2016   Procedure:  ULNAR NERVE DECOMPRESSION/TRANSPOSITION;  Surgeon: [name], MD;  Location: [facility];  Service: Orthopedics;  Laterality: Right;  Marland Kitchen VIDEO BRONCHOSCOPY WITH ENDOBRONCHIAL ULTRASOUND Left 12/19/2018   Procedure: VIDEO BRONCHOSCOPY WITH ENDOBRONCHIAL ULTRASOUND, LEFT, SLEEP APNEA;  Surgeon: [name], MD;  Location: [facility];  Service: Thoracic;  Laterality: Left;    Social History   Socioeconomic History  . Marital status: Married    Spouse name: Not on file  . Number of children: Not on file  . Years of education: Not on file  . Highest education level: Not on file   Occupational History  . Not on file  Tobacco Use  . Smoking status: Former Smoker    Quit date: 06/13/2007    Years since quitting: 13.1  . Smokeless tobacco: Never Used  Vaping Use  . Vaping Use: Never used  Substance and Sexual Activity  . Alcohol use: No    Alcohol/week: 0.0 standard drinks  . Drug use: No  . Sexual activity: Never  Other Topics Concern  . Not on file  Social History Narrative  . Not on file   Social Determinants of Health   Financial Resource Strain: Not on file  Food Insecurity: Not on file  Transportation Needs: Not on file  Physical Activity: Not on file  Stress: Not on file  Social Connections: Not on file  Intimate Partner Violence: Not on file    Family History  Problem Relation Age of Onset  . Heart disease Mother        s/p stent  . Hypertension Mother   . Hypercholesterolemia Mother   . Diabetes Father   . Stomach cancer Other        uncle  . Breast cancer Cousin   . Breast cancer Paternal Aunt      Current Outpatient Medications:  .  albuterol (PROVENTIL) (2.5 MG/3ML) 0.083% nebulizer solution, Take 2.5 mg by nebulization every 6 (six) hours as needed for wheezing., Disp: , Rfl:  .  albuterol (VENTOLIN HFA) 108 (90 Base) MCG/ACT inhaler, Inhale 2 puffs into the lungs every 6 (six) hours as needed for wheezing or shortness of breath., Disp: 18 g, Rfl: 0 .  Bioflavonoid Products (ESTER C PO), Take 1 tablet by mouth 3 (three) times daily., Disp: , Rfl:  .  Calcium-Magnesium-Zinc (CAL-MAG-ZINC PO), Take 1 tablet by mouth daily., Disp: , Rfl:  .  cetirizine (ZYRTEC) 5 MG tablet, Take 1 tablet (5 mg total) by mouth daily. (Patient taking differently: Take 5 mg by mouth daily as needed (runny nose.).), Disp: 30 tablet, Rfl: 0 .  CHLORASEPTIC MAX SORE THROAT 1.5-33 % LIQD, Use as directed 1 spray in the mouth or throat as needed (throat irritation.). , Disp: , Rfl:  .  Cholecalciferol (EQL VITAMIN D3) 25 MCG (1000 UT) capsule, Take 4,000 Units  by mouth daily., Disp: , Rfl:  .  Clotrimazole 1 % OINT, Apply 1 application topically in the morning, at noon, and at bedtime., Disp: 56.7 g, Rfl: 0 .  Coenzyme Q10 (CO Q 10) 100 MG CAPS, Take 100 mg by mouth daily. , Disp: , Rfl:  .  Cyanocobalamin (VITAMIN B-12) 2500 MCG SUBL, Place 2,500 mcg under the tongue daily., Disp: , Rfl:  .  cyclobenzaprine (FLEXERIL) 10 MG tablet, Take 10 mg by mouth at bedtime., Disp: , Rfl:  .  DHEA 25 MG CAPS, Take 25 mg by mouth daily., Disp: , Rfl:  .  Ensure (ENSURE), Take 237 mLs by mouth 2 (two)  times daily between meals., Disp: , Rfl:  .  ezetimibe (ZETIA) 10 MG tablet, Take 5 mg by mouth in the morning and at bedtime. , Disp: , Rfl:  .  folic acid (FOLVITE) 161 MCG tablet, Take 400 mcg by mouth 2 (two) times daily. , Disp: , Rfl:  .  Garlic 0960 MG CAPS, Take 1,000 mg by mouth daily., Disp: , Rfl:  .  guaiFENesin-dextromethorphan (ROBITUSSIN DM) 100-10 MG/5ML syrup, Take 5 mLs by mouth every 4 (four) hours as needed for cough., Disp: , Rfl:  .  Histamine Dihydrochloride (AUSTRALIAN DREAM ARTHRITIS EX), Apply 1 application topically 4 (four) times daily as needed (pain.)., Disp: , Rfl:  .  ipratropium (ATROVENT) 0.03 % nasal spray, SMARTSIG:1-2 Spray(s) Both Nares 3 Times Daily PRN, Disp: , Rfl:  .  lidocaine-prilocaine (EMLA) cream, lidocaine-prilocaine 2.5 %-2.5 % topical cream, Disp: , Rfl:  .  meclizine (ANTIVERT) 25 MG tablet, Take 1 tablet (25 mg total) by mouth every 6 (six) hours as needed for dizziness., Disp: 30 tablet, Rfl: 0 .  montelukast (SINGULAIR) 10 MG tablet, Take 10 mg by mouth at bedtime. , Disp: , Rfl:  .  ondansetron (ZOFRAN-ODT) 4 MG disintegrating tablet, Take 4 mg by mouth every 8 (eight) hours as needed for nausea/vomiting., Disp: , Rfl:  .  Polyethyl Glycol-Propyl Glycol (SYSTANE) 0.4-0.3 % SOLN, Place 1 drop into both eyes 5 (five) times daily as needed (dry/irritated eyes). , Disp: , Rfl:  .  pyridOXINE (VITAMIN B-6) 100 MG tablet,  Take 100 mg by mouth daily., Disp: , Rfl:  .  Selenium 200 MCG CAPS, Take 200 mcg by mouth daily., Disp: , Rfl:  .  sucralfate (CARAFATE) 1 g tablet, 1 tablet 3 (three) times daily as needed. , Disp: , Rfl:  .  triamcinolone (KENALOG) 0.1 %, Apply 1 application topically 2 (two) times daily., Disp: 30 g, Rfl: 0 .  UBRELVY 100 MG TABS, Take by mouth., Disp: , Rfl:  .  Vitamin A 2250 MCG (7500 UT) CAPS, Take 1 capsule by mouth daily. , Disp: , Rfl:  .  vitamin E 400 UNIT capsule, Take 400 Units by mouth daily., Disp: , Rfl:  .  Zoledronic Acid (RECLAST IV), Inject 1 Dose into the vein as directed. ONCE A YEAR, Disp: , Rfl:  .  HYDROcodone-acetaminophen (NORCO/VICODIN) 5-325 MG tablet, 1-2 tabs po bid prn (Patient taking differently: Take 1 tablet by mouth at bedtime.), Disp: 10 tablet, Rfl: 0  Physical exam:  Vitals:   07/28/20 1125  BP: 113/62  Pulse: 78  Temp: (!) 97.2 F (36.2 C)  TempSrc: Tympanic  SpO2: 100%  Weight: 162 lb 4.8 oz (73.6 kg)   Physical Exam Constitutional:      General: She is not in acute distress. Cardiovascular:     Rate and Rhythm: Normal rate and regular rhythm.     Heart sounds: Normal heart sounds.  Pulmonary:     Effort: Pulmonary effort is normal.     Breath sounds: Normal breath sounds.  Skin:    General: Skin is warm and dry.  Neurological:     Mental Status: She is alert and oriented to person, place, and time.      CMP Latest Ref Rng & Units 07/28/2020  Glucose 70 - 99 mg/dL 115(H)  BUN 8 - 23 mg/dL 14  Creatinine 0.44 - 1.00 mg/dL 0.44  Sodium 135 - 145 mmol/L 137  Potassium 3.5 - 5.1 mmol/L 4.0  Chloride 98 - 111  mmol/L 102  CO2 22 - 32 mmol/L 27  Calcium 8.9 - 10.3 mg/dL 9.0  Total Protein 6.5 - 8.1 g/dL 6.5  Total Bilirubin 0.3 - 1.2 mg/dL 0.7  Alkaline Phos 38 - 126 U/L 58  AST 15 - 41 U/L 24  ALT 0 - 44 U/L 14   CBC Latest Ref Rng & Units 07/28/2020  WBC 4.0 - 10.5 K/uL 3.9(L)  Hemoglobin 12.0 - 15.0 g/dL 85.5  Hematocrit 57.3  - 46.0 % 35.6(L)  Platelets 150 - 400 K/uL 206    No images are attached to the encounter.  CT CHEST ABDOMEN PELVIS W CONTRAST  Result Date: 07/20/2020 CLINICAL DATA:  Lung cancer restaging. EXAM: CT CHEST, ABDOMEN, AND PELVIS WITH CONTRAST TECHNIQUE: Multidetector CT imaging of the chest, abdomen and pelvis was performed following the standard protocol during bolus administration of intravenous contrast. CONTRAST:  OMNIPAQUE IOHEXOL 300 MG/ML  SOLN COMPARISON:  04/03/2020 FINDINGS: CT CHEST FINDINGS Cardiovascular: The heart size is normal. No substantial pericardial effusion. Coronary artery calcification is evident. Atherosclerotic calcification is noted in the wall of the thoracic aorta. Right Port-A-Cath tip is in the distal SVC. Mediastinum/Nodes: No mediastinal lymphadenopathy. There is no hilar lymphadenopathy. The esophagus has normal imaging features. There is no axillary lymphadenopathy. Lungs/Pleura: Post treatment changes in the suprahilar left lung are stable in the interval. Centrilobular emphsyema noted. 4 mm medial right lower lobe nodule on 100/4 is similar to prior. Posterior right lower lobe 2 mm nodule measured previously is stable on 92/4. Small cluster of tree-in-bud nodularity in the peripheral right lower lobe is again identified. Nodular component measured at 8 mm previously is similar today on image 86/4. No focal airspace consolidation. No pleural effusion. Musculoskeletal: No worrisome lytic or sclerotic osseous abnormality. CT ABDOMEN PELVIS FINDINGS Hepatobiliary: No suspicious focal abnormality within the liver parenchyma. Gallbladder surgically absent. Intra and extrahepatic biliary duct dilatation is similar to prior with common bile duct measuring 8-9 mm in the head of pancreas. Pancreas: No focal mass lesion. No dilatation of the main duct. No intraparenchymal cyst. No peripancreatic edema. Pancreas divisum anatomy again noted. Spleen: No splenomegaly. No focal mass  lesion. Adrenals/Urinary Tract: No adrenal nodule or mass. Tiny hypoattenuating lesions in each kidney are too small to characterize but likely benign. Distal ureters and bladder obscured by beam hardening artifact from bilateral hip replacement. Stomach/Bowel: Tiny hiatal hernia. Stomach decompressed. Duodenum is normally positioned as is the ligament of Treitz. No small bowel wall thickening. No small bowel dilatation. The terminal ileum is normal. The appendix is not well visualized, but there is no edema or inflammation in the region of the cecum. Colon unremarkable. Distal sigmoid colon and rectum obscured by beam hardening artifact. Vascular/Lymphatic: There is abdominal aortic atherosclerosis without aneurysm. There is no gastrohepatic or hepatoduodenal ligament lymphadenopathy. No retroperitoneal or mesenteric lymphadenopathy. No pelvic sidewall lymphadenopathy. Reproductive: Obscured by beam hardening artifact from hip replacement. Other: No intraperitoneal free fluid. Musculoskeletal: Bilateral hip replacement. No worrisome lytic or sclerotic osseous abnormality. IMPRESSION: 1. Stable exam. No new or progressive findings to suggest recurrent or metastatic disease in the chest, abdomen, or pelvis. 2. Stable post treatment changes in the suprahilar left lung. 3. Stable tiny right lower lobe pulmonary nodules including a small focus of tree-in-bud nodularity, likely reflecting sequelae of prior atypical infection. Continued attention on follow-up recommended. 4. Tiny hiatal hernia. 5. Pancreas divisum anatomy. 6. Aortic Atherosclerosis (ICD10-I70.0) and Emphysema (ICD10-J43.9). Electronically Signed   By: Jamison Oka.D.  On: 07/20/2020 13:20     Assessment and plan- Patient is a 75 y.o. female with stage III adenocarcinoma of the lung s/p concurrent chemoradiation with weekly CarboTaxol followed by maintenance durvalumab ending in February 2022.  She is here for routine follow-up  Clinically  patient is doing well and recent CT scan showed no concerning signs and symptoms of recurrenceRadiation changes noted in the left lung.  Bilateral subcentimeter lung nodules overall stable.  I will plan to repeat CT chest abdomen and pelvis without contrast in 4 months and see her thereafter   Visit Diagnosis 1. Encounter for follow-up surveillance of lung cancer      Dr. Randa Evens, MD, MPH [facility] [phone] 07/28/2020 1:17 PM

## 2020-07-29 NOTE — Addendum Note (Signed)
Addended by: Kern Alberta on: 07/29/2020 09:06 AM   Modules accepted: Orders

## 2020-09-09 ENCOUNTER — Ambulatory Visit
Admission: RE | Admit: 2020-09-09 | Discharge: 2020-09-09 | Disposition: A | Discharge status: Home / Self Care | Payer: Medicare Other | Source: Ambulatory Visit | Attending: Radiation Oncology | Admitting: Radiation Oncology

## 2020-09-09 VITALS — Wt 161.0 lb

## 2020-09-09 DIAGNOSIS — Z9221 Personal history of antineoplastic chemotherapy: Secondary | ICD-10-CM | POA: Insufficient documentation

## 2020-09-09 DIAGNOSIS — Z923 Personal history of irradiation: Secondary | ICD-10-CM | POA: Insufficient documentation

## 2020-09-09 DIAGNOSIS — C3412 Malignant neoplasm of upper lobe, left bronchus or lung: Secondary | ICD-10-CM

## 2020-09-09 DIAGNOSIS — L989 Disorder of the skin and subcutaneous tissue, unspecified: Secondary | ICD-10-CM | POA: Insufficient documentation

## 2020-09-09 DIAGNOSIS — Z85118 Personal history of other malignant neoplasm of bronchus and lung: Secondary | ICD-10-CM | POA: Insufficient documentation

## 2020-09-09 DIAGNOSIS — K449 Diaphragmatic hernia without obstruction or gangrene: Secondary | ICD-10-CM | POA: Diagnosis not present

## 2020-09-09 NOTE — Progress Notes (Signed)
Radiation Oncology Follow up Note  Name: Jennifer Hodges   Date:   09/09/2020 MRN:  [id number] DOB: 01-Jun-1945    This 75 y.o. female presents to the clinic today for 13-month follow-up status post concurrent chemoradiation therapy for stage IIIa (T1b N2 M0) non-small cell lung cancer favoring adenocarcinoma of the left lung.  REFERRING PROVIDER: [name], MD  HPI: Patient is a.  75 year old female now out 17 months having completed concurrent chemoradiation therapy for stage IIIa favoring adenocarcinoma of the left lung seen today in routine follow-up she is doing well she walks every day exercises stays active she has no significant cough hemoptysis chest tightness.  She is been seeing a dermatologist for several skin lesions which are under observation.  Her most recent CT scan of chest abdomen pelvis was stable showing no new or progressive findings to suggest recurrent or metastatic disease.  Suprahilar left lung has stable treatment changes.  She does have a tiny hiatal hernia for which she is using Carafate.  She completed maintenance durvalumab back in February.  COMPLICATIONS OF TREATMENT: none  FOLLOW UP COMPLIANCE: keeps appointments   PHYSICAL EXAM:  BP (P) 116/69 (BP Location: Left Arm, Patient Position: Sitting)   Pulse (P) 72   Temp (!) (P) 97 F (36.1 C) (Tympanic)   Resp (P) 18   Wt (P) 161 lb (73 kg)   SpO2 (P) 100%   BMI (P) 30.42 kg/m  Well-developed well-nourished patient in NAD. HEENT reveals PERLA, EOMI, discs not visualized.  Oral cavity is clear. No oral mucosal lesions are identified. Neck is clear without evidence of cervical or supraclavicular adenopathy. Lungs are clear to A&P. Cardiac examination is essentially unremarkable with regular rate and rhythm without murmur rub or thrill. Abdomen is benign with no organomegaly or masses noted. Motor sensory and DTR levels are equal and symmetric in the upper and lower extremities. Cranial nerves II through XII  are grossly intact. Proprioception is intact. No peripheral adenopathy or edema is identified. No motor or sensory levels are noted. Crude visual fields are within normal range.  RADIOLOGY RESULTS: CT scans reviewed compatible with above-stated findings  PLAN: Present time patient continues to do well with no evidence of disease.  She continues close follow-up care with medical oncology.  I have asked to see her back in 1 year for follow-up.  Patient knows to call at anytime with any concerns.  I would like to take this opportunity to thank you for allowing me to participate in the care of your patient.Noreene Filbert, MD

## 2020-09-09 NOTE — Progress Notes (Signed)
Survivorship Care Plan visit completed.  Treatment summary reviewed and given to patient.  ASCO answers booklet reviewed and given to patient.  CARE program and Cancer Transitions discussed with patient along with other resources cancer center offers to patients and caregivers.  Patient verbalized understanding.    

## 2020-09-16 ENCOUNTER — Inpatient Hospital Stay: Payer: Medicare Other | Attending: Oncology

## 2020-09-16 DIAGNOSIS — Z452 Encounter for adjustment and management of vascular access device: Secondary | ICD-10-CM | POA: Insufficient documentation

## 2020-09-16 DIAGNOSIS — Z95828 Presence of other vascular implants and grafts: Secondary | ICD-10-CM

## 2020-09-16 DIAGNOSIS — Z85118 Personal history of other malignant neoplasm of bronchus and lung: Secondary | ICD-10-CM | POA: Diagnosis present

## 2020-09-16 MED ORDER — HEPARIN SOD (PORK) LOCK FLUSH 100 UNIT/ML IV SOLN
500.0000 [IU] | Freq: Once | INTRAVENOUS | Status: AC
  Administered 2020-09-16: 500 [IU] via INTRAVENOUS
  Filled 2020-09-16: qty 5

## 2020-09-16 MED ORDER — HEPARIN SOD (PORK) LOCK FLUSH 100 UNIT/ML IV SOLN
INTRAVENOUS | Status: AC
  Filled 2020-09-16: qty 5

## 2020-09-16 MED ORDER — SODIUM CHLORIDE 0.9% FLUSH
10.0000 mL | Freq: Once | INTRAVENOUS | Status: AC
  Administered 2020-09-16: 10 mL via INTRAVENOUS
  Filled 2020-09-16: qty 10

## 2020-11-04 ENCOUNTER — Inpatient Hospital Stay: Payer: Medicare Other | Attending: Oncology

## 2020-11-04 DIAGNOSIS — Z85118 Personal history of other malignant neoplasm of bronchus and lung: Secondary | ICD-10-CM | POA: Diagnosis present

## 2020-11-04 DIAGNOSIS — Z452 Encounter for adjustment and management of vascular access device: Secondary | ICD-10-CM | POA: Diagnosis not present

## 2020-11-04 DIAGNOSIS — Z95828 Presence of other vascular implants and grafts: Secondary | ICD-10-CM

## 2020-11-04 MED ORDER — HEPARIN SOD (PORK) LOCK FLUSH 100 UNIT/ML IV SOLN
500.0000 [IU] | Freq: Once | INTRAVENOUS | Status: AC
  Administered 2020-11-04: 500 [IU] via INTRAVENOUS
  Filled 2020-11-04: qty 5

## 2020-11-04 MED ORDER — SODIUM CHLORIDE 0.9% FLUSH
10.0000 mL | INTRAVENOUS | Status: DC | PRN
  Administered 2020-11-04: 10 mL via INTRAVENOUS
  Filled 2020-11-04: qty 10

## 2020-11-04 MED ORDER — HEPARIN SOD (PORK) LOCK FLUSH 100 UNIT/ML IV SOLN
INTRAVENOUS | Status: AC
  Filled 2020-11-04: qty 5

## 2020-11-11 IMAGING — US US SOFT TISSUE HEAD/NECK
1 series · 14 of 16 positions shown · non-contrast
Comparison: 07/02/2019 CT

CLINICAL DATA: Left lateral neck soft tissue swelling

EXAM:
ULTRASOUND OF HEAD/NECK SOFT TISSUES
TECHNIQUE: Ultrasound examination of the head and neck soft tissues was
performed in the area of clinical concern.

[Series 1: us soft tissue head/neck · 0.07mm/px · 14 of 16 slices shown]
[im 1/16]
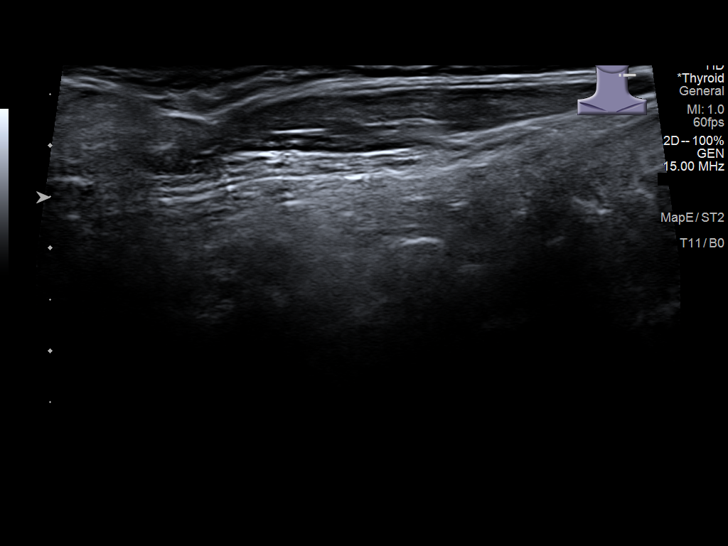
[im 2/16]
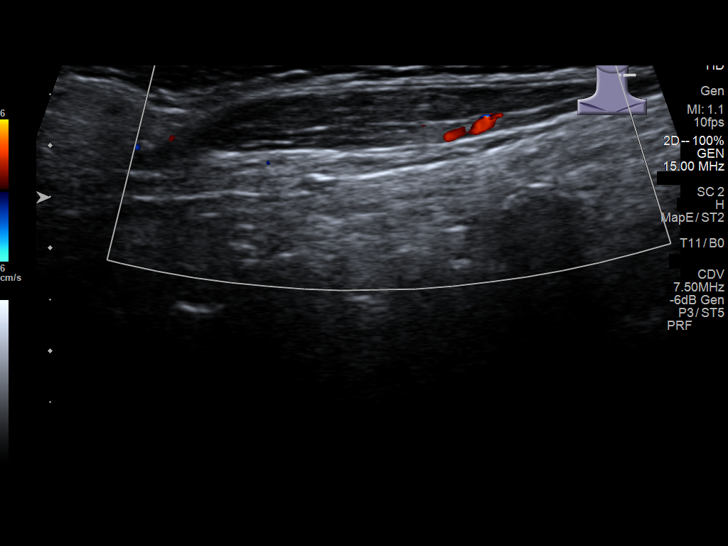
[im 3/16]
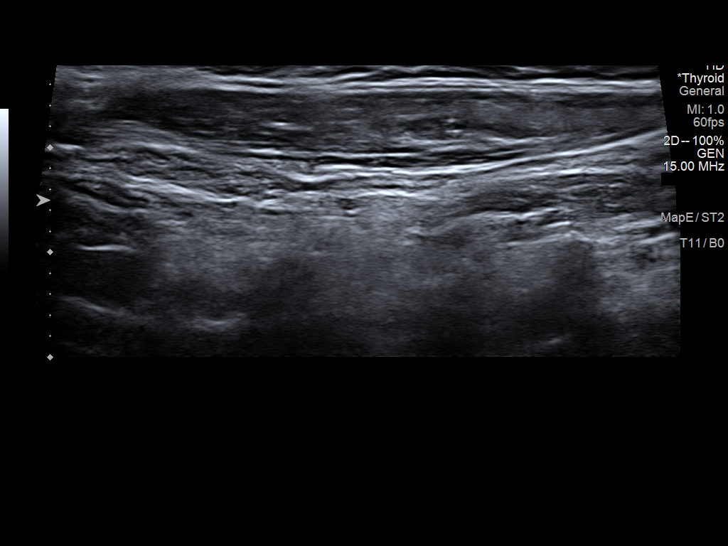
[im 5/16]
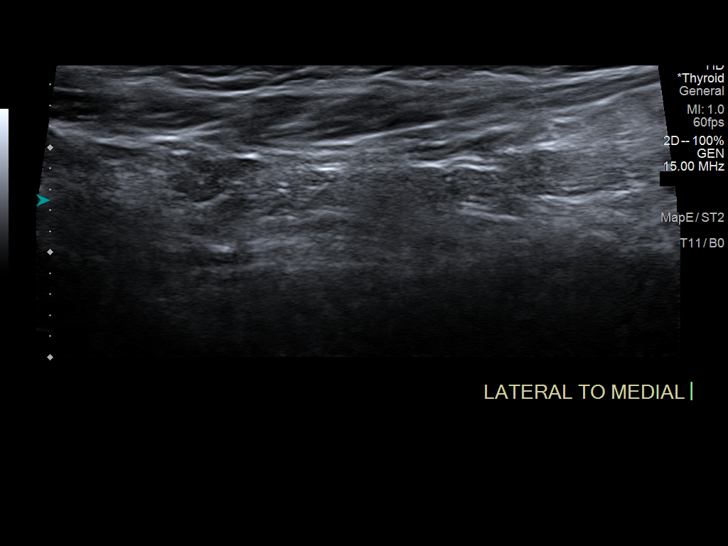
[im 6/16]
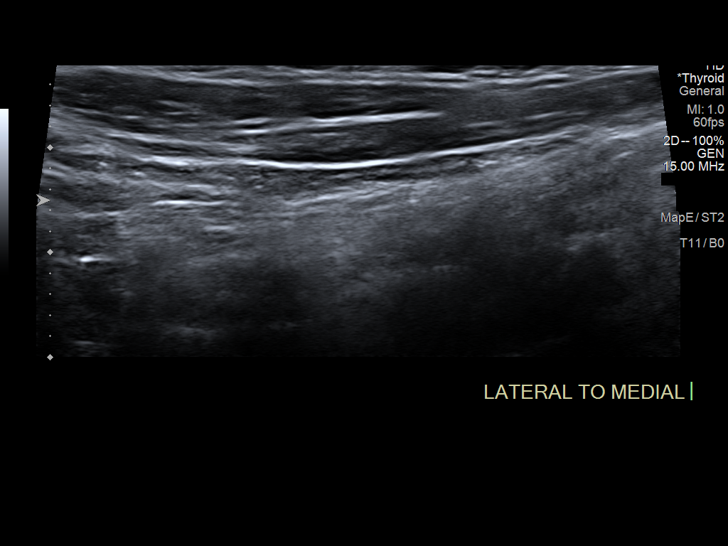
[im 7/16]
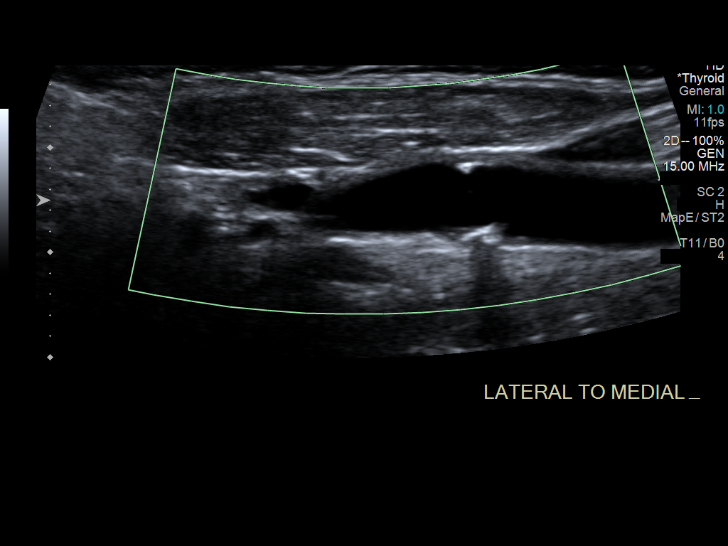
[im 8/16]
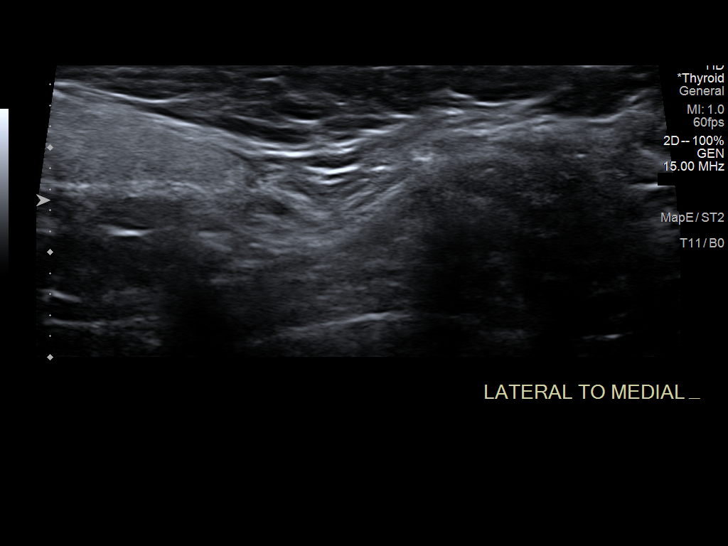
[im 9/16]
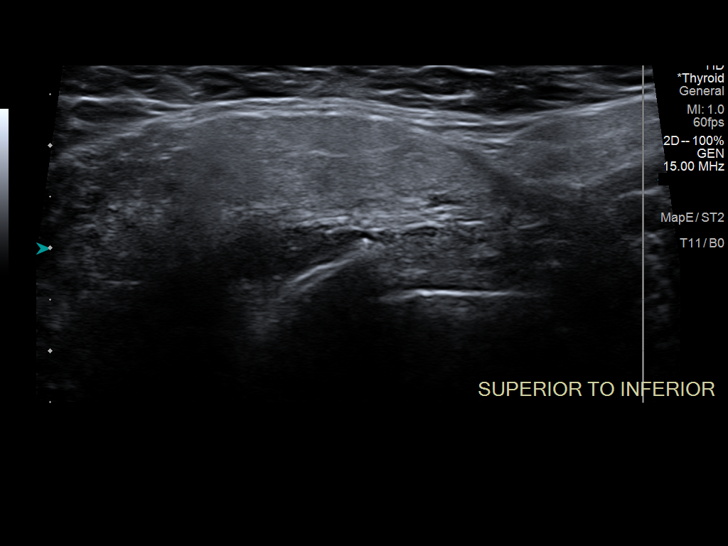
[im 10/16]
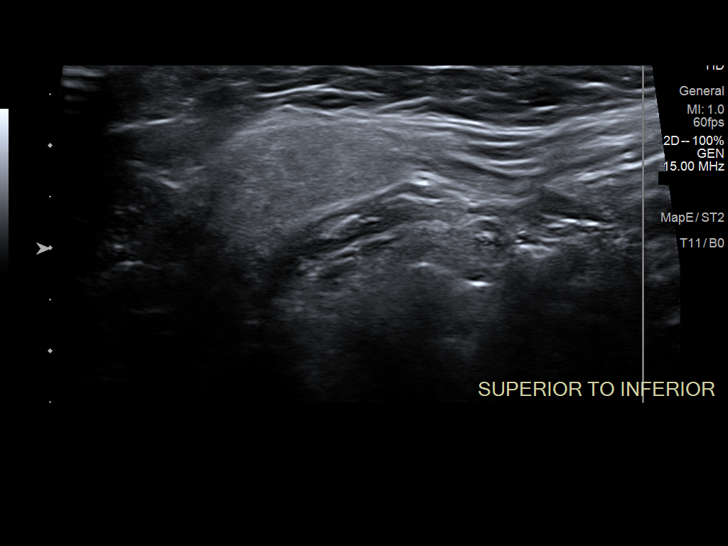
[im 11/16]
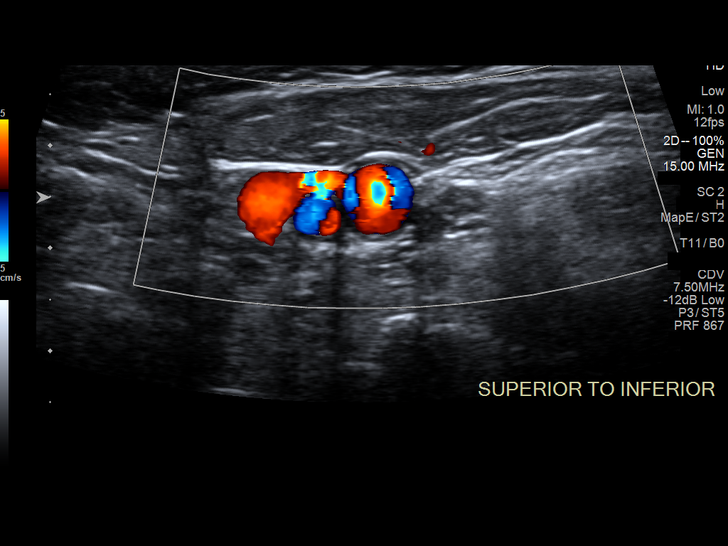
[im 13/16]
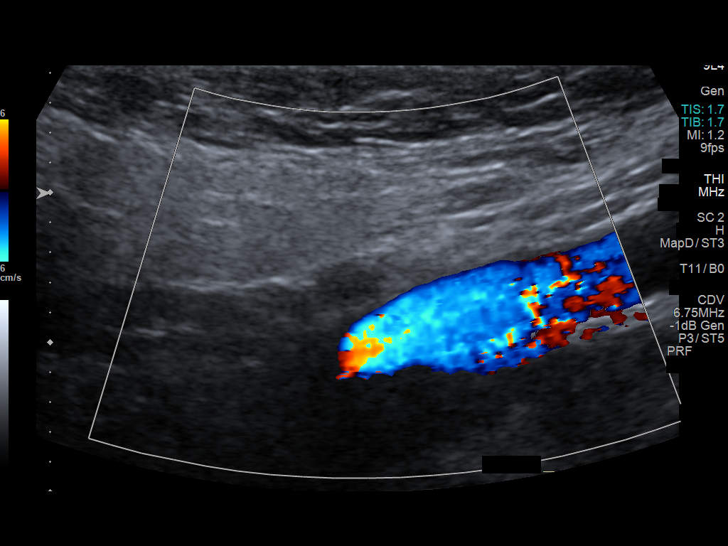
[im 14/16]
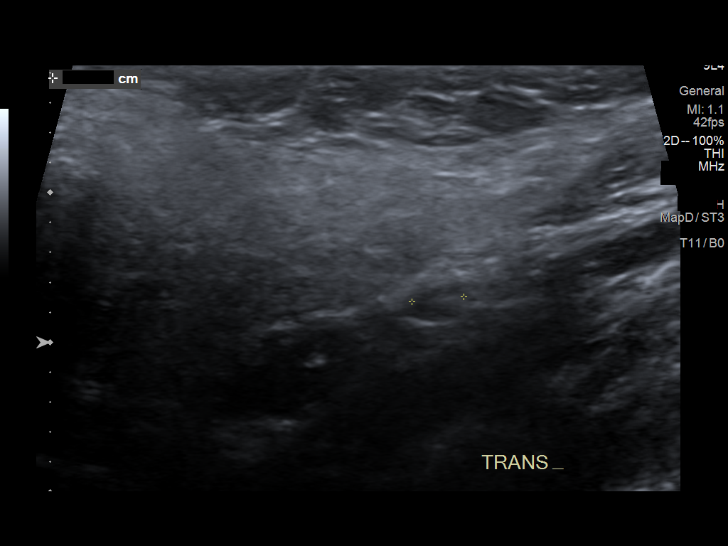
[im 15/16]
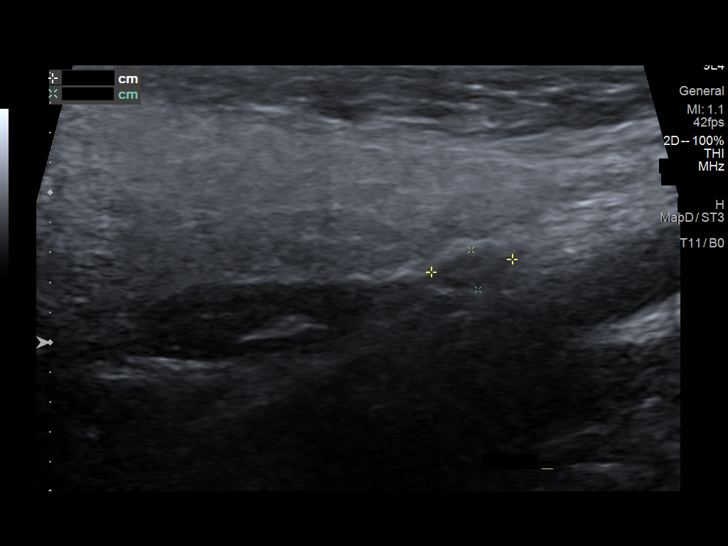
[im 16/16]
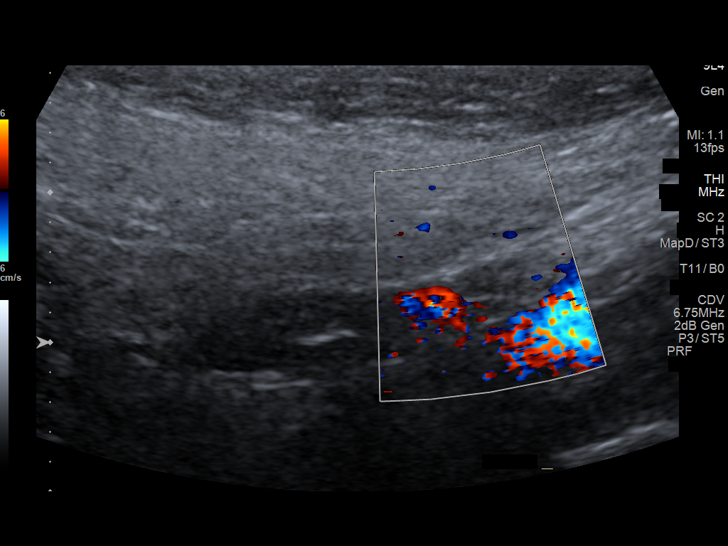

[14 of 16 positions shown; findings below may reference images not displayed]

FINDINGS: Superficial soft tissue ultrasound performed of the left lateral
neck area of concern. No significant underlying soft tissue mass,
cyst, bulky adenopathy, fluid collection, or abscess. Small benign
lymph nodes noted.
IMPRESSION: No significant left lateral neck soft tissue abnormality by
ultrasound.

## 2020-11-17 ENCOUNTER — Ambulatory Visit
Admission: RE | Admit: 2020-11-17 | Discharge: 2020-11-17 | Disposition: A | Discharge status: Home / Self Care | Payer: Medicare Other | Source: Ambulatory Visit | Attending: Oncology | Admitting: Oncology

## 2020-11-17 ENCOUNTER — Other Ambulatory Visit: Payer: Self-pay

## 2020-11-17 DIAGNOSIS — C3412 Malignant neoplasm of upper lobe, left bronchus or lung: Secondary | ICD-10-CM | POA: Insufficient documentation

## 2020-11-27 ENCOUNTER — Inpatient Hospital Stay: Payer: Medicare Other

## 2020-11-27 ENCOUNTER — Inpatient Hospital Stay: Payer: Medicare Other | Admitting: Oncology

## 2020-11-27 ENCOUNTER — Encounter: Payer: Self-pay | Admitting: Oncology

## 2020-11-27 ENCOUNTER — Encounter: Payer: Self-pay | Admitting: Licensed Clinical Social Worker

## 2020-11-27 ENCOUNTER — Inpatient Hospital Stay: Payer: Medicare Other | Attending: Oncology

## 2020-11-27 ENCOUNTER — Inpatient Hospital Stay (HOSPITAL_BASED_OUTPATIENT_CLINIC_OR_DEPARTMENT_OTHER): Payer: Medicare Other | Admitting: Oncology

## 2020-11-27 VITALS — BP 124/67 | HR 76 | Temp 97.0°F | Wt 160.2 lb

## 2020-11-27 DIAGNOSIS — Z87891 Personal history of nicotine dependence: Secondary | ICD-10-CM | POA: Diagnosis not present

## 2020-11-27 DIAGNOSIS — R490 Dysphonia: Secondary | ICD-10-CM | POA: Diagnosis not present

## 2020-11-27 DIAGNOSIS — Z9221 Personal history of antineoplastic chemotherapy: Secondary | ICD-10-CM | POA: Diagnosis not present

## 2020-11-27 DIAGNOSIS — C3412 Malignant neoplasm of upper lobe, left bronchus or lung: Secondary | ICD-10-CM | POA: Insufficient documentation

## 2020-11-27 DIAGNOSIS — Z08 Encounter for follow-up examination after completed treatment for malignant neoplasm: Secondary | ICD-10-CM

## 2020-11-27 DIAGNOSIS — Z923 Personal history of irradiation: Secondary | ICD-10-CM | POA: Diagnosis not present

## 2020-11-27 DIAGNOSIS — J069 Acute upper respiratory infection, unspecified: Secondary | ICD-10-CM | POA: Insufficient documentation

## 2020-11-27 LAB — COMPREHENSIVE METABOLIC PANEL
ALT: 17 U/L (ref 0–44)
AST: 20 U/L (ref 15–41)
Albumin: 4.4 g/dL (ref 3.5–5.0)
Alkaline Phosphatase: 76 U/L (ref 38–126)
Anion gap: 8 (ref 5–15)
BUN: 15 mg/dL (ref 8–23)
CO2: 30 mmol/L (ref 22–32)
Calcium: 9.3 mg/dL (ref 8.9–10.3)
Chloride: 100 mmol/L (ref 98–111)
Creatinine, Ser: 0.42 mg/dL — ABNORMAL LOW (ref 0.44–1.00)
GFR, Estimated: >60 mL/min (ref >60)
Glucose, Bld: 95 mg/dL (ref 70–99)
Potassium: 4.3 mmol/L (ref 3.5–5.1)
Sodium: 138 mmol/L (ref 135–145)
Total Bilirubin: 0.5 mg/dL (ref 0.3–1.2)
Total Protein: 6.8 g/dL (ref 6.5–8.1)

## 2020-11-27 LAB — CBC WITH DIFFERENTIAL/PLATELET
Abs Immature Granulocytes: 0.02 10*3/uL (ref 0.00–0.07)
Basophils Absolute: 0 10*3/uL (ref 0.0–0.1)
Basophils Relative: 1 %
Eosinophils Absolute: 0.4 10*3/uL (ref 0.0–0.5)
Eosinophils Relative: 8 %
HCT: 38.2 % (ref 36.0–46.0)
Hemoglobin: 12.8 g/dL (ref 12.0–15.0)
Immature Granulocytes: 0 %
Lymphocytes Relative: 19 %
Lymphs Abs: 0.9 10*3/uL (ref 0.7–4.0)
MCH: 31.4 pg (ref 26.0–34.0)
MCHC: 33.5 g/dL (ref 30.0–36.0)
MCV: 93.6 fL (ref 80.0–100.0)
Monocytes Absolute: 0.3 10*3/uL (ref 0.1–1.0)
Monocytes Relative: 7 %
Neutro Abs: 3.1 10*3/uL (ref 1.7–7.7)
Neutrophils Relative %: 65 %
Platelets: 196 10*3/uL (ref 150–400)
RBC: 4.08 MIL/uL (ref 3.87–5.11)
RDW: 12.3 % (ref 11.5–15.5)
WBC: 4.7 10*3/uL (ref 4.0–10.5)
nRBC: 0 % (ref 0.0–0.2)

## 2020-11-27 LAB — TSH: TSH: 2.331 u[IU]/mL (ref 0.350–4.500)

## 2020-11-27 MED ORDER — AZITHROMYCIN 250 MG PO TABS: ORAL_TABLET | ORAL | 0 refills | Status: DC

## 2020-11-27 NOTE — Progress Notes (Signed)
Hematology/Oncology Consult note [facility]  Telephone:([phone] [phone]  Patient Care Team: [name], MD as PCP - General (Family Medicine) Telford Nab, RN as Registered Nurse Noreene Filbert, MD as Referring Physician (Radiation Oncology) Sindy Guadeloupe, MD as Consulting Physician (Oncology)   Name of the patient: Jennifer Hodges  [id number]  May 12, 1945   Date of visit: 11/27/20  Diagnosis- Adenocarcinoma of the lung stage III T1b N2 M0  Chief complaint/ Reason for visit-discuss CT scan results  Heme/Onc history: patient is a 75 year old female who was seen by pulmonary and ENT for ongoing symptoms of bronchitis and possible laryngitis and received antibiotics for the same. She underwent CT chest in September 2020 which showed multiple bilateral lung nodules with associated mediastinal adenopathy and possible primary left upper lobe lung mass.  This was followed by a PET CT scan which showed a left upper lobe lung mass measuring 1.9 x 1.2 cm with an SUV of 10.7.  Conglomerate AP window adenopathy measuring 5.8 x 2.1 cm with an SUV of 14.  She was also noted to have hypermetabolic left hilar adenopathy and left supraclavicular 0.7 cm lymph node with an SUV of 4.6.  Noted to have a 7 mm right lower lobe lung nodule with faint metabolic activity.  No evidence of other distant metastatic disease.  This was followed by a CT super D chest without contrast.  The right lower lobe lung nodules were somewhat larger as compared to September 2020.  Bronchoscopy of the left upper lobe lung mass and the lymph node showed non-small cell lung carcinoma favoring adenocarcinoma.  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  EGFR and Ros as well as MET testing negative. PD-L1  was 50%   Patient completed concurrent chemoradiation with weekly carbotaxol chemotherapy on 03/12/2019.  Maintenance durvalumab started in January 2021 after scan showed partial response.  Patient completed 1 year of maintenance durvalumab in February 2022  Interval history-patient reports overall doing well.  Reports a 3-weeks worth of a little coughing and congestion.  She has started Mucinex which has helped some.  Reports occasional "coughing attacks".  She is still remaining active.  She walks and rides her bike daily.  Feels that she has some wheezing at bedtime.  She has been trying to lose some weight and is struggling.  Eats a fairly decent diet.  She denies any fevers.  ECOG PS- 1 Pain scale- 0   Review of systems- Review of Systems  Constitutional:  Positive for malaise/fatigue.  Respiratory:  Positive for cough, sputum production, shortness of breath and wheezing.    Allergies  Allergen Reactions   Aspirin Hives and Other (See Comments)    Difficulty breathing   Celebrex [Celecoxib] Shortness Of Breath   Morphine And Related Nausea And Vomiting and Swelling   Adhesive [Tape] Other (See Comments)    Took top layer of skin off when removed.   Clarithromycin Nausea And Vomiting   Codeine Nausea And Vomiting   Darvon [Propoxyphene Hcl] Nausea And Vomiting   Demerol [Meperidine] Nausea And Vomiting   Flonase [Fluticasone Propionate] Other (See Comments)    Fungal infection   Simvastatin Other (See Comments)    "caused ulcers in mouth, and fever"   Talwin [Pentazocine] Nausea And Vomiting     Past Medical History:  Diagnosis Date  Anemia    Asthma    No Inhalers--Dr. [name] will order as needed   Bronchiectasis ([place])    mild   Chronic headaches     followed by [facility] migraines   COPD (chronic obstructive pulmonary disease) (HCC)    DDD (degenerative disc disease), lumbar    Diverticulosis    Family history of adverse reaction to anesthesia    PONV    Gall stones    history of   GERD (gastroesophageal reflux disease)    EGD 8/09- non bleeding erosive gastritis, documentd esophageal ulcerations.    Hiatal hernia    History of pneumonia    Hypercholesterolemia    IBS (irritable bowel syndrome)    Malignant neoplasm of upper lobe of left lung ([place]) 12/27/2018   Meniere disease    Murmur    Osteoarthritis    lumbar disc disease, left hip   Personal history of chemotherapy    Personal history of radiation therapy    PONV (postoperative nausea and vomiting)    "Only with last hip and I believe it was due to the morphine"    Sleep apnea    cpap asked to bring mask and tubing   Vertigo    Weakness of right side of body      Past Surgical History:  Procedure Laterality Date   ABDOMINAL HYSTERECTOMY  age 89   ANTERIOR CERVICAL DECOMP/DISCECTOMY FUSION N/A 02/24/2015   Procedure: CERVICAL FOUR-FIVE, CERVICAL FIVE-SIX, CERVICAL SIX-SEVEN ANTERIOR CERVICAL DECOMPRESSION/DISCECTOMY FUSION ;  Surgeon: [name], MD;  Location: [facility];  Service: Neurosurgery;  Laterality: N/A;  C45 C56 C67 anterior cervical decompression with fusion interbody prosthesis plating and bonegraft   APPENDECTOMY     BACK SURGERY     4th lumbar fusion   BLADDER SURGERY N/A    with vaginal wall repair   BREAST CYST ASPIRATION Bilateral    neg   BREAST SURGERY Bilateral    cyst removed and reduction   CARDIAC CATHETERIZATION  2014   CARPAL TUNNEL RELEASE Right 02/11/2016   Procedure: CARPAL TUNNEL RELEASE;  Surgeon: [name], MD;  Location: [facility];  Service: Orthopedics;  Laterality: Right;   CATARACT EXTRACTION W/ INTRAOCULAR LENS IMPLANT Bilateral 2015   CHOLECYSTECTOMY     EUS N/A 05/31/2012   Procedure: UPPER ENDOSCOPIC ULTRASOUND (EUS) LINEAR;  Surgeon: [name], MD;  Location: [facility];  Service: Endoscopy;  Laterality: N/A;   EXCISIONAL HEMORRHOIDECTOMY     JOINT REPLACEMENT Bilateral    KNEE ARTHROSCOPY WITH LATERAL  MENISECTOMY Right 07/07/2015   Procedure: KNEE ARTHROSCOPY WITH LATERAL MENISECTOMY, PARTIAL SYNOVECTOMY;  Surgeon: [name], MD;  Location: [facility];  Service: Orthopedics;  Laterality: Right;   LUMBAR LAMINECTOMY     PORTA CATH INSERTION N/A 01/07/2019   Procedure: PORTA CATH INSERTION;  Surgeon: [name], MD;  Location: [facility];  Service: Cardiovascular;  Laterality: N/A;   REDUCTION MAMMAPLASTY  1990   RIGHT OOPHORECTOMY     TOTAL HIP ARTHROPLASTY Left 05/01/2014   Dr. [name]   TOTAL HIP ARTHROPLASTY Right 08/05/2014   Procedure: TOTAL HIP ARTHROPLASTY ANTERIOR APPROACH;  Surgeon: [name], MD;  Location: [facility];  Service: Orthopedics;  Laterality: Right;   ULNAR NERVE TRANSPOSITION Right 02/11/2016   Procedure: ULNAR NERVE DECOMPRESSION/TRANSPOSITION;  Surgeon: [name], MD;  Location: [facility];  Service: Orthopedics;  Laterality: Right;   VIDEO BRONCHOSCOPY WITH ENDOBRONCHIAL ULTRASOUND Left 12/19/2018   Procedure: VIDEO BRONCHOSCOPY WITH ENDOBRONCHIAL ULTRASOUND,  LEFT, SLEEP APNEA;  Surgeon: [name], MD;  Location: [facility];  Service: Thoracic;  Laterality: Left;    Social History   Socioeconomic History   Marital status: Married    Spouse name: Not on file   Number of children: Not on file   Years of education: Not on file   Highest education level: Not on file  Occupational History   Not on file  Tobacco Use   Smoking status: Former    Types: Cigarettes    Quit date: 06/13/2007    Years since quitting: 13.4   Smokeless tobacco: Never  Vaping Use   Vaping Use: Never used  Substance and Sexual Activity   Alcohol use: No    Alcohol/week: 0.0 standard drinks   Drug use: No   Sexual activity: Never  Other Topics Concern   Not on file  Social History Narrative   Not on file   Social Determinants of Health   Financial Resource Strain: Not on file  Food Insecurity: Not on file  Transportation Needs: Not on file  Physical  Activity: Not on file  Stress: Not on file  Social Connections: Not on file  Intimate Partner Violence: Not on file    Family History  Problem Relation Age of Onset   Heart disease Mother        s/p stent   Hypertension Mother    Hypercholesterolemia Mother    Diabetes Father    Stomach cancer Other        uncle   Breast cancer Cousin    Breast cancer Paternal Aunt      Current Outpatient Medications:    albuterol (PROVENTIL) (2.5 MG/3ML) 0.083% nebulizer solution, Take 2.5 mg by nebulization every 6 (six) hours as needed for wheezing., Disp: , Rfl:    albuterol (VENTOLIN HFA) 108 (90 Base) MCG/ACT inhaler, Inhale 2 puffs into the lungs every 6 (six) hours as needed for wheezing or shortness of breath., Disp: 18 g, Rfl: 0   Bioflavonoid Products (ESTER C PO), Take 1 tablet by mouth 3 (three) times daily., Disp: , Rfl:    Calcium-Magnesium-Zinc (CAL-MAG-ZINC PO), Take 1 tablet by mouth daily., Disp: , Rfl:    cetirizine (ZYRTEC) 5 MG tablet, Take 1 tablet (5 mg total) by mouth daily., Disp: 30 tablet, Rfl: 0   CHLORASEPTIC MAX SORE THROAT 1.5-33 % LIQD, Use as directed 1 spray in the mouth or throat as needed (throat irritation.). , Disp: , Rfl:    Cholecalciferol (EQL VITAMIN D3) 25 MCG (1000 UT) capsule, Take 4,000 Units by mouth daily., Disp: , Rfl:    Clotrimazole 1 % OINT, Apply 1 application topically in the morning, at noon, and at bedtime., Disp: 56.7 g, Rfl: 0   Coenzyme Q10 (CO Q 10) 100 MG CAPS, Take 100 mg by mouth daily. , Disp: , Rfl:    Cyanocobalamin (VITAMIN B-12) 2500 MCG SUBL, Place 2,500 mcg under the tongue daily., Disp: , Rfl:    cyclobenzaprine (FLEXERIL) 10 MG tablet, Take 10 mg by mouth at bedtime., Disp: , Rfl:    DHEA 25 MG CAPS, Take 25 mg by mouth daily., Disp: , Rfl:    Ensure (ENSURE), Take 237 mLs by mouth 2 (two) times daily between meals., Disp: , Rfl:    ezetimibe (ZETIA) 10 MG tablet, Take 5 mg by mouth in the morning and at bedtime. , Disp: , Rfl:     folic acid (FOLVITE) 188 MCG tablet, Take 400 mcg by mouth 2 (  two) times daily. , Disp: , Rfl:    Garlic 2706 MG CAPS, Take 1,000 mg by mouth daily., Disp: , Rfl:    guaiFENesin-dextromethorphan (ROBITUSSIN DM) 100-10 MG/5ML syrup, Take 5 mLs by mouth every 4 (four) hours as needed for cough., Disp: , Rfl:    Histamine Dihydrochloride (AUSTRALIAN DREAM ARTHRITIS EX), Apply 1 application topically 4 (four) times daily as needed (pain.)., Disp: , Rfl:    HYDROcodone-acetaminophen (NORCO/VICODIN) 5-325 MG tablet, 1-2 tabs po bid prn (Patient taking differently: Take 1 tablet by mouth at bedtime.), Disp: 10 tablet, Rfl: 0   ipratropium (ATROVENT) 0.03 % nasal spray, SMARTSIG:1-2 Spray(s) Both Nares 3 Times Daily PRN, Disp: , Rfl:    lidocaine-prilocaine (EMLA) cream, lidocaine-prilocaine 2.5 %-2.5 % topical cream, Disp: , Rfl:    meclizine (ANTIVERT) 25 MG tablet, Take 1 tablet (25 mg total) by mouth every 6 (six) hours as needed for dizziness., Disp: 30 tablet, Rfl: 0   montelukast (SINGULAIR) 10 MG tablet, Take 10 mg by mouth at bedtime. , Disp: , Rfl:    ondansetron (ZOFRAN-ODT) 4 MG disintegrating tablet, Take 4 mg by mouth every 8 (eight) hours as needed for nausea/vomiting., Disp: , Rfl:    Polyethyl Glycol-Propyl Glycol (SYSTANE) 0.4-0.3 % SOLN, Place 1 drop into both eyes 5 (five) times daily as needed (dry/irritated eyes). , Disp: , Rfl:    pyridOXINE (VITAMIN B-6) 100 MG tablet, Take 100 mg by mouth daily., Disp: , Rfl:    Selenium 200 MCG CAPS, Take 200 mcg by mouth daily., Disp: , Rfl:    sucralfate (CARAFATE) 1 g tablet, 1 tablet 3 (three) times daily as needed. , Disp: , Rfl:    triamcinolone (KENALOG) 0.1 %, Apply 1 application topically 2 (two) times daily., Disp: 30 g, Rfl: 0   UBRELVY 100 MG TABS, Take by mouth., Disp: , Rfl:    Vitamin A 2250 MCG (7500 UT) CAPS, Take 1 capsule by mouth daily. , Disp: , Rfl:    vitamin E 400 UNIT capsule, Take 400 Units by mouth daily., Disp: ,  Rfl:    Zoledronic Acid (RECLAST IV), Inject 1 Dose into the vein as directed. ONCE A YEAR, Disp: , Rfl:   Physical exam:  Vitals:   11/27/20 1344  BP: 124/67  Pulse: 76  Temp: (!) 97 F (36.1 C)  TempSrc: Tympanic  Weight: 160 lb 3.2 oz (72.7 kg)   Physical Exam Constitutional:      Appearance: Normal appearance.  HENT:     Head: Normocephalic and atraumatic.  Eyes:     Pupils: Pupils are equal, round, and reactive to light.  Cardiovascular:     Rate and Rhythm: Normal rate and regular rhythm.     Heart sounds: Normal heart sounds. No murmur heard. Pulmonary:     Effort: Pulmonary effort is normal.     Breath sounds: Normal breath sounds. No wheezing.  Abdominal:     General: Bowel sounds are normal. There is no distension.     Palpations: Abdomen is soft.     Tenderness: There is no abdominal tenderness.  Musculoskeletal:        General: Normal range of motion.     Cervical back: Normal range of motion.  Skin:    General: Skin is warm and dry.     Findings: No rash.  Neurological:     Mental Status: She is alert and oriented to person, place, and time.  Psychiatric:        Judgment:  Judgment normal.     CMP Latest Ref Rng & Units 11/27/2020  Glucose 70 - 99 mg/dL 95  BUN 8 - 23 mg/dL 15  Creatinine 0.44 - 1.00 mg/dL 0.42(L)  Sodium 135 - 145 mmol/L 138  Potassium 3.5 - 5.1 mmol/L 4.3  Chloride 98 - 111 mmol/L 100  CO2 22 - 32 mmol/L 30  Calcium 8.9 - 10.3 mg/dL 9.3  Total Protein 6.5 - 8.1 g/dL 6.8  Total Bilirubin 0.3 - 1.2 mg/dL 0.5  Alkaline Phos 38 - 126 U/L 76  AST 15 - 41 U/L 20  ALT 0 - 44 U/L 17   CBC Latest Ref Rng & Units 11/27/2020  WBC 4.0 - 10.5 K/uL 4.7  Hemoglobin 12.0 - 15.0 g/dL 12.8  Hematocrit 36.0 - 46.0 % 38.2  Platelets 150 - 400 K/uL 196    No images are attached to the encounter.  CT CHEST ABDOMEN PELVIS WO CONTRAST  Result Date: 11/18/2020 CLINICAL DATA:  Restaging of lung cancer after initial treatment. EXAM: CT CHEST,  ABDOMEN AND PELVIS WITHOUT CONTRAST TECHNIQUE: Multidetector CT imaging of the chest, abdomen and pelvis was performed following the standard protocol without IV contrast. COMPARISON:  Jul 20, 2020. FINDINGS: CT CHEST FINDINGS Cardiovascular: Calcified atheromatous plaque in the thoracic aorta. No aneurysmal dilation. Normal appearance of central pulmonary vasculature. Normal heart size without substantial pericardial effusion. RIGHT-sided Port-A-Cath in place terminating at the caval to atrial junction, entering via IJ approach. Mediastinum/Nodes: No mediastinal adenopathy. No thoracic inlet lymphadenopathy. No internal mammary lymphadenopathy. No gross hilar adenopathy. Scarring about LEFT hilum with associated bronchiectatic changes and central consolidation without change. Lungs/Pleura: RIGHT basilar tree-in-bud nodularity and grouped nodularity with similar appearance, this measures approximately 10 x 8 mm in total not substantially changed from May of 2022 though with subtle increase in density as compared to October of 2021 along the pleural margin. New nodule in the medial RIGHT lung base in the RIGHT lower lobe (image 107/5) 5 mm, new as compared to October of 2021 and slightly more conspicuous perhaps a mm larger when compared to the study of May of 2022. Other small areas of nodularity in the RIGHT lung base with stable appearance. No effusion. No consolidative process. Musculoskeletal: See below for full musculoskeletal details. CT ABDOMEN PELVIS FINDINGS Hepatobiliary: Smooth liver contours. No focal, suspicious hepatic lesion. No biliary duct dilation. Post cholecystectomy. Pancreas: Normal contours without signs of inflammation or gross ductal dilation. Spleen: Normal contour and size. Adrenals/Urinary Tract: Adrenal glands are normal. Symmetric appearance of the kidneys without contour irregularity. Nephrolithiasis with tiny calculus in the lower pole the RIGHT kidney. Urinary bladder obscured by  bilateral hip arthroplasty changes as before. Stomach/Bowel: No acute gastrointestinal process. Vascular/Lymphatic: Calcified atheromatous plaque of the abdominal aorta. Flattened IVC. There is no gastrohepatic or hepatoduodenal ligament lymphadenopathy. No retroperitoneal or mesenteric lymphadenopathy. No pelvic sidewall lymphadenopathy. Reproductive: No gross adnexal mass. Streak artifact from hip arthroplasty changes limit assessment. Other: No ascites Musculoskeletal: ACDF in the cervical spine. No sign of acute or destructive bone process. Bilateral hip arthroplasty changes with resultant streak artifact about the pelvis. IMPRESSION: Slight increasing conspicuity of dominant pulmonary nodule in the RIGHT lower lobe which is new since October of 2021. Now at 5 mm, previously 4 mm and less conspicuous/dense on the previous study. Other scattered nodules with similar appearance compared to the most recent prior but also with increasing conspicuity of grouped nodules along the RIGHT posterolateral chest. For above findings would consider a short interval  follow-up to determine whether further assessment with PET may be warranted. Again findings do have some features suggesting sequela atypical infection. Stable post treatment changes in the LEFT chest. No signs of disease in the abdomen or pelvis. Nephrolithiasis. Aortic Atherosclerosis (ICD10-I70.0). Electronically Signed   By: Zetta Bills M.D.   On: 11/18/2020 12:32      Assessment and plan- Patient is a 75 y.o. female with stage III adenocarcinoma of the lung s/p concurrent chemoradiation with weekly CarboTaxol followed by maintenance durvalumab ending in February 2022.  She is here for routine follow-up  Clinically patient is doing well.  Recent CT scan showed slight increase in conspicuity of dominant pulmonary nodule in the right lower lobe which is new from October 2021.  Previously measured 4 mm in size and is now measuring 5 mm.  Other scattered  pulmonary nodules with similar appearance compared to most recent prior but with also increased conspicuous city of grouped nodules along the right posterior lateral chest.  Radiology recommends a short interval follow-up to determine whether further assessment with PET would be warranted.  Findings are also suggestive of atypical infection.  Given her recent symptoms of URI starting about 3 weeks ago, we will treat her with a Z-Pak.  She will return to clinic in 4 months with repeat CT chest abdomen and pelvis and follow-up with Dr. Janese Banks.  I spent 15 minutes dedicated to the care of this patient (face-to-face and non-face-to-face) on the date of the encounter to include what is described in the assessment and plan.   Visit Diagnosis 1. Malignant neoplasm of upper lobe of left lung ([place])    Faythe Casa, NP 11/27/2020 2:16 PM

## 2020-11-30 ENCOUNTER — Telehealth: Payer: Self-pay | Admitting: *Deleted

## 2020-11-30 NOTE — Telephone Encounter (Signed)
Last visit with pulmonary was July 2022 which recommended to continue prn albuterol nebulizer treatments. Pt recently seen by Sonia Baller in [facility] who prescribed 5 day z-pak. Pt will most likely need to schedule follow up visit with pulmonary if symptoms continue.

## 2020-11-30 NOTE — Telephone Encounter (Signed)
If she has problems seeing pulmonary she can reach out to [place]

## 2020-11-30 NOTE — Telephone Encounter (Signed)
Patient called reporting that she has a very deep cough and also is wheezing a lot at night. She states that she will take her last antibiotics tomorrow and feels she needs more and stronger antibiotics. Asking if Dr Janese Banks needs to see her. Please advise

## 2020-11-30 NOTE — Telephone Encounter (Signed)
Can you reach out to pulmonary for recs? Recent ct showed stable disease. What antibiotics has she taken?

## 2020-11-30 NOTE — Telephone Encounter (Signed)
Call returned to patient, I got her voice mail and left message to please contact Dr Leane Platt office and to call me back to let me know that she got my message.

## 2020-12-12 DIAGNOSIS — J189 Pneumonia, unspecified organism: Secondary | ICD-10-CM

## 2020-12-12 HISTORY — DX: Pneumonia, unspecified organism: J18.9

## 2021-01-04 ENCOUNTER — Other Ambulatory Visit: Payer: Self-pay

## 2021-01-04 ENCOUNTER — Ambulatory Visit
Admission: RE | Admit: 2021-01-04 | Discharge: 2021-01-04 | Disposition: A | Discharge status: Home / Self Care | Payer: Medicare Other | Source: Home / Self Care | Attending: Hospice and Palliative Medicine | Admitting: Hospice and Palliative Medicine

## 2021-01-04 ENCOUNTER — Encounter: Payer: Self-pay | Admitting: Oncology

## 2021-01-04 ENCOUNTER — Ambulatory Visit
Admission: RE | Admit: 2021-01-04 | Discharge: 2021-01-04 | Disposition: A | Discharge status: Home / Self Care | Payer: Medicare Other | Source: Ambulatory Visit | Attending: Hospice and Palliative Medicine | Admitting: Hospice and Palliative Medicine

## 2021-01-04 ENCOUNTER — Telehealth: Payer: Self-pay | Admitting: *Deleted

## 2021-01-04 DIAGNOSIS — J189 Pneumonia, unspecified organism: Secondary | ICD-10-CM | POA: Diagnosis not present

## 2021-01-04 DIAGNOSIS — C3412 Malignant neoplasm of upper lobe, left bronchus or lung: Secondary | ICD-10-CM | POA: Insufficient documentation

## 2021-01-04 NOTE — Telephone Encounter (Signed)
Patient notified that order for CXR is being entered and that she can go to [facility] to get it any time without and appointment before 5. She states she had a CXR 2 weeks ago and I told her to go ahead and get it done and we can compare to see if she is getting better or worse. She has agreed to this plan

## 2021-01-04 NOTE — Telephone Encounter (Signed)
Patient called reporting that she continues to have shortness of breath, wheezing, and coughing. She is unable to sleep due to her symptoms. She denies fever. She was seen recently and diagnosed with Bronchitis and was given antibiotics by our office and she has also been seen by Dr [name] as well who ordered more antibiotics, neb, and and cough medicine. She states that nothing seems to be working and is asking for [place] to do something. She said she is getting weaker. She states she contacted Dr Leane Platt office Friday and was told to use her Neb every 4 hours. Please advise

## 2021-01-05 ENCOUNTER — Encounter: Payer: Self-pay | Admitting: Oncology

## 2021-01-05 ENCOUNTER — Other Ambulatory Visit: Payer: Self-pay

## 2021-01-05 ENCOUNTER — Emergency Department: Payer: Medicare Other

## 2021-01-05 ENCOUNTER — Ambulatory Visit
Admission: EM | Admit: 2021-01-05 | Discharge: 2021-01-05 | Disposition: A | Discharge status: Home / Self Care | Payer: Medicare Other

## 2021-01-05 ENCOUNTER — Telehealth: Payer: Self-pay | Admitting: *Deleted

## 2021-01-05 DIAGNOSIS — R0902 Hypoxemia: Secondary | ICD-10-CM | POA: Diagnosis not present

## 2021-01-05 DIAGNOSIS — I10 Essential (primary) hypertension: Secondary | ICD-10-CM | POA: Diagnosis present

## 2021-01-05 DIAGNOSIS — C3412 Malignant neoplasm of upper lobe, left bronchus or lung: Secondary | ICD-10-CM

## 2021-01-05 DIAGNOSIS — E7800 Pure hypercholesterolemia, unspecified: Secondary | ICD-10-CM | POA: Diagnosis present

## 2021-01-05 DIAGNOSIS — R0603 Acute respiratory distress: Secondary | ICD-10-CM | POA: Diagnosis not present

## 2021-01-05 DIAGNOSIS — Z87891 Personal history of nicotine dependence: Secondary | ICD-10-CM

## 2021-01-05 DIAGNOSIS — K219 Gastro-esophageal reflux disease without esophagitis: Secondary | ICD-10-CM | POA: Diagnosis present

## 2021-01-05 DIAGNOSIS — Z885 Allergy status to narcotic agent status: Secondary | ICD-10-CM

## 2021-01-05 DIAGNOSIS — Z66 Do not resuscitate: Secondary | ICD-10-CM | POA: Diagnosis present

## 2021-01-05 DIAGNOSIS — I700 Atherosclerosis of aorta: Secondary | ICD-10-CM | POA: Diagnosis present

## 2021-01-05 DIAGNOSIS — Z20822 Contact with and (suspected) exposure to covid-19: Secondary | ICD-10-CM | POA: Diagnosis present

## 2021-01-05 DIAGNOSIS — Z981 Arthrodesis status: Secondary | ICD-10-CM

## 2021-01-05 DIAGNOSIS — Z96643 Presence of artificial hip joint, bilateral: Secondary | ICD-10-CM | POA: Diagnosis present

## 2021-01-05 DIAGNOSIS — G4733 Obstructive sleep apnea (adult) (pediatric): Secondary | ICD-10-CM | POA: Diagnosis present

## 2021-01-05 DIAGNOSIS — Z9221 Personal history of antineoplastic chemotherapy: Secondary | ICD-10-CM

## 2021-01-05 DIAGNOSIS — Z8249 Family history of ischemic heart disease and other diseases of the circulatory system: Secondary | ICD-10-CM

## 2021-01-05 DIAGNOSIS — Z888 Allergy status to other drugs, medicaments and biological substances status: Secondary | ICD-10-CM

## 2021-01-05 DIAGNOSIS — Z9841 Cataract extraction status, right eye: Secondary | ICD-10-CM

## 2021-01-05 DIAGNOSIS — Z79899 Other long term (current) drug therapy: Secondary | ICD-10-CM

## 2021-01-05 DIAGNOSIS — Z803 Family history of malignant neoplasm of breast: Secondary | ICD-10-CM

## 2021-01-05 DIAGNOSIS — Z800 Family history of malignant neoplasm of digestive organs: Secondary | ICD-10-CM

## 2021-01-05 DIAGNOSIS — Z961 Presence of intraocular lens: Secondary | ICD-10-CM | POA: Diagnosis present

## 2021-01-05 DIAGNOSIS — Z833 Family history of diabetes mellitus: Secondary | ICD-10-CM

## 2021-01-05 DIAGNOSIS — Z83438 Family history of other disorder of lipoprotein metabolism and other lipidemia: Secondary | ICD-10-CM

## 2021-01-05 DIAGNOSIS — Z923 Personal history of irradiation: Secondary | ICD-10-CM

## 2021-01-05 DIAGNOSIS — Z9842 Cataract extraction status, left eye: Secondary | ICD-10-CM

## 2021-01-05 DIAGNOSIS — I2510 Atherosclerotic heart disease of native coronary artery without angina pectoris: Secondary | ICD-10-CM | POA: Diagnosis present

## 2021-01-05 DIAGNOSIS — J189 Pneumonia, unspecified organism: Principal | ICD-10-CM | POA: Diagnosis present

## 2021-01-05 DIAGNOSIS — Z85118 Personal history of other malignant neoplasm of bronchus and lung: Secondary | ICD-10-CM

## 2021-01-05 LAB — COMPREHENSIVE METABOLIC PANEL
ALT: 15 U/L (ref 0–44)
AST: 19 U/L (ref 15–41)
Albumin: 4.1 g/dL (ref 3.5–5.0)
Alkaline Phosphatase: 76 U/L (ref 38–126)
Anion gap: 8 (ref 5–15)
BUN: 7 mg/dL — ABNORMAL LOW (ref 8–23)
CO2: 28 mmol/L (ref 22–32)
Calcium: 9 mg/dL (ref 8.9–10.3)
Chloride: 103 mmol/L (ref 98–111)
Creatinine, Ser: 0.37 mg/dL — ABNORMAL LOW (ref 0.44–1.00)
GFR, Estimated: >60 mL/min (ref >60)
Glucose, Bld: 130 mg/dL — ABNORMAL HIGH (ref 70–99)
Potassium: 3.9 mmol/L (ref 3.5–5.1)
Sodium: 139 mmol/L (ref 135–145)
Total Bilirubin: 0.8 mg/dL (ref 0.3–1.2)
Total Protein: 7.2 g/dL (ref 6.5–8.1)

## 2021-01-05 LAB — CBC WITH DIFFERENTIAL/PLATELET
Abs Immature Granulocytes: 0.01 10*3/uL (ref 0.00–0.07)
Basophils Absolute: 0.1 10*3/uL (ref 0.0–0.1)
Basophils Relative: 1 %
Eosinophils Absolute: 1.3 10*3/uL — ABNORMAL HIGH (ref 0.0–0.5)
Eosinophils Relative: 17 %
HCT: 39.8 % (ref 36.0–46.0)
Hemoglobin: 13.7 g/dL (ref 12.0–15.0)
Immature Granulocytes: 0 %
Lymphocytes Relative: 13 %
Lymphs Abs: 1 10*3/uL (ref 0.7–4.0)
MCH: 32 pg (ref 26.0–34.0)
MCHC: 34.4 g/dL (ref 30.0–36.0)
MCV: 93 fL (ref 80.0–100.0)
Monocytes Absolute: 0.5 10*3/uL (ref 0.1–1.0)
Monocytes Relative: 6 %
Neutro Abs: 4.7 10*3/uL (ref 1.7–7.7)
Neutrophils Relative %: 63 %
Platelets: 246 10*3/uL (ref 150–400)
RBC: 4.28 MIL/uL (ref 3.87–5.11)
RDW: 12.5 % (ref 11.5–15.5)
WBC: 7.4 10*3/uL (ref 4.0–10.5)
nRBC: 0 % (ref 0.0–0.2)

## 2021-01-05 LAB — TROPONIN I (HIGH SENSITIVITY)
Troponin I (High Sensitivity): 4 ng/L (ref <18)
Troponin I (High Sensitivity): 4 ng/L (ref <18)

## 2021-01-05 LAB — RESP PANEL BY RT-PCR (FLU A&B, COVID) ARPGX2
Influenza A by PCR: NEGATIVE
Influenza B by PCR: NEGATIVE
SARS Coronavirus 2 by RT PCR: NEGATIVE

## 2021-01-05 NOTE — Telephone Encounter (Signed)
Patient called asking if we have the CXR results from xray done yesterday. She was panting and puffing to breathe during conversation, worse than she sounded yesterday. The xray has not been read so I called [employer] and spoke with Levander Campion who said she is going to send it to be read stat.Being concerned about her breathing I called and discussed with J Borders, NP and he recommends she go to the ER since she has been on antibiotics times 2, has SVN's and cough medicine and is not improving. I called patient back who said she does not like our ER and preferred to go to a walk in clinic and wants to go to the [facility] clinic for this. I told her that is her decision and to please be sure to go be checked. She said she wants to start there and if needed will go to ER.

## 2021-01-05 NOTE — ED Notes (Signed)
Patient is being discharged from the Urgent Care and sent to the Emergency Department via EMS . Per B.Carlyon Prows PA, patient is in need of higher level of care due to SOB. Patient is aware and verbalizes understanding of plan of care.

## 2021-01-05 NOTE — ED Triage Notes (Signed)
Pt comes into the ED via EMS from [facility] urgent care, SOB over the past 3 weeks, sleeping in her recliner, duoneb x2 given PTA, COPD, lung CA hx #[id number] 99HR 97%on 2LNC 151/63  93%RA

## 2021-01-05 NOTE — ED Notes (Addendum)
Pt c/o SHOB for 15mos, prev tx lung CA; sat 95% on 2l/min O2 (new for pt); reviewed pt's chart and results with Dr Quentin Cornwall and order placed for COVID swab

## 2021-01-05 NOTE — ED Provider Notes (Signed)
[facility]    CSN: [id number] Arrival date & time: 01/05/21  1607      History   Chief Complaint Chief Complaint  Patient presents with   Shortness of Breath    HPI Jennifer Hodges is a 75 y.o. female with history of asthma, COPD, malignant neoplasm of upper lobe of left lung.  Patient presents today for dyspnea on exertion for the past greater than 4 weeks.  Patient has been treated for suspected pneumonia with doxycycline and Levaquin says over the past few days her breathing has progressively worsened.  She has tried albuterol which has not helped.  She admits to wheezing over the past 4 days.  She says she cannot lay down because she feels like she is getting choked on mucus.  She has not had any fevers.  Has been fatigued.  She says her chest also hurts when she breathes.  Patient sees Dr. [name], pulmonologist.  Patient did contact provider today and advised him she was feeling more short of breath.  They advised her to go to the emergency department, but patient thought she might be able to bypass the ER by coming to urgent care.  Other past medical history significant for mild bronchiectasis, arthritis, sleep apnea (uses CPAP).  Patient states she has never had to use oxygen before.  No other complaints.  HPI  Past Medical History:  Diagnosis Date   Anemia    Asthma    No Inhalers--Dr. [name] will order as needed   Bronchiectasis ([place])    mild   Chronic headaches     followed by [facility] migraines   COPD (chronic obstructive pulmonary disease) (HCC)    DDD (degenerative disc disease), lumbar    Diverticulosis    Family history of adverse reaction to anesthesia    PONV   Gall stones    history of   GERD (gastroesophageal reflux disease)    EGD 8/09- non bleeding erosive gastritis, documentd esophageal ulcerations.    Hiatal hernia    History of pneumonia    Hypercholesterolemia    IBS (irritable bowel syndrome)    Malignant neoplasm of upper lobe  of left lung ([place]) 12/27/2018   Meniere disease    Murmur    Osteoarthritis    lumbar disc disease, left hip   Personal history of chemotherapy    Personal history of radiation therapy    PONV (postoperative nausea and vomiting)    "Only with last hip and I believe it was due to the morphine"    Sleep apnea    cpap asked to bring mask and tubing   Vertigo    Weakness of right side of body     Patient Active Problem List   Diagnosis Date Noted   Benign breast lumps 01/11/2019   Gout 01/11/2019   Hives 01/11/2019   Lung disease 01/11/2019   Migraine headache 01/11/2019   Goals of care, counseling/discussion 12/27/2018   Malignant neoplasm of upper lobe of left lung ([place]) 12/27/2018   Night sweats 08/07/2016   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   Osteopenia of multiple sites 04/27/2016   Left carpal tunnel syndrome 02/02/2016   Carotid stenosis, asymptomatic, bilateral 12/29/2015   Prediabetes 09/28/2015   Primary osteoarthritis involving multiple joints 09/28/2015   Knee pain 05/11/2015   Chest tightness 03/24/2015   Stool incontinence 03/24/2015   Urinary frequency 03/04/2015   Cervical spondylosis with radiculopathy 02/24/2015   Pre-syncope 02/19/2015   URI (upper respiratory infection) 02/15/2015  Neck pain 12/25/2014   Pelvic pain in female 11/16/2014   Primary osteoarthritis of hip 08/05/2014   Heel pain 06/01/2014   Diarrhea 06/01/2014   Health care maintenance 06/01/2014   Right leg pain 03/30/2014   Right arm pain 03/30/2014   Sinusitis 03/30/2014   Internal nasal lesion 11/12/2013   Other specified disorders of nose and nasal sinuses 11/12/2013   Chronic tension-type headache, intractable 10/30/2013   Intractable migraine without aura and without status migrainosus 10/30/2013   Occipital neuralgia of left side 10/30/2013   IBS (irritable bowel syndrome) 09/03/2013   Rib pain 08/04/2013   Cervical radiculitis 08/01/2013   Lumbar radiculitis 08/01/2013    Palpitations 07/25/2013   Benign essential hypertension 05/28/2013   Neuralgia, neuritis, and radiculitis, unspecified 05/28/2013   Vitamin D deficiency 05/28/2013   Elevated AFP 04/28/2013   Abnormality of alpha-fetoprotein 04/28/2013   Osteoporosis 11/12/2012   Incomplete emptying of bladder 07/11/2012   Prolapse of vaginal vault after hysterectomy 07/11/2012   Nonspecific (abnormal) findings on radiological and other examination of gastrointestinal tract 05/31/2012   Bronchiectasis ([place]) 02/05/2012   Sleep apnea 02/05/2012   Degenerative disc disease, cervical 02/05/2012   Hypercholesterolemia 02/05/2012   GERD (gastroesophageal reflux disease) 02/05/2012   Chronic headaches 02/05/2012    Past Surgical History:  Procedure Laterality Date   ABDOMINAL HYSTERECTOMY  age 41   ANTERIOR CERVICAL DECOMP/DISCECTOMY FUSION N/A 02/24/2015   Procedure: CERVICAL FOUR-FIVE, CERVICAL FIVE-SIX, CERVICAL SIX-SEVEN ANTERIOR CERVICAL DECOMPRESSION/DISCECTOMY FUSION ;  Surgeon: [name], MD;  Location: [facility];  Service: Neurosurgery;  Laterality: N/A;  C45 C56 C67 anterior cervical decompression with fusion interbody prosthesis plating and bonegraft   APPENDECTOMY     BACK SURGERY     4th lumbar fusion   BLADDER SURGERY N/A    with vaginal wall repair   BREAST CYST ASPIRATION Bilateral    neg   BREAST SURGERY Bilateral    cyst removed and reduction   CARDIAC CATHETERIZATION  2014   CARPAL TUNNEL RELEASE Right 02/11/2016   Procedure: CARPAL TUNNEL RELEASE;  Surgeon: [name], MD;  Location: [facility];  Service: Orthopedics;  Laterality: Right;   CATARACT EXTRACTION W/ INTRAOCULAR LENS IMPLANT Bilateral 2015   CHOLECYSTECTOMY     EUS N/A 05/31/2012   Procedure: UPPER ENDOSCOPIC ULTRASOUND (EUS) LINEAR;  Surgeon: [name], MD;  Location: [facility];  Service: Endoscopy;  Laterality: N/A;   EXCISIONAL HEMORRHOIDECTOMY     JOINT REPLACEMENT Bilateral    KNEE  ARTHROSCOPY WITH LATERAL MENISECTOMY Right 07/07/2015   Procedure: KNEE ARTHROSCOPY WITH LATERAL MENISECTOMY, PARTIAL SYNOVECTOMY;  Surgeon: [name], MD;  Location: [facility];  Service: Orthopedics;  Laterality: Right;   LUMBAR LAMINECTOMY     PORTA CATH INSERTION N/A 01/07/2019   Procedure: PORTA CATH INSERTION;  Surgeon: [name], MD;  Location: [facility];  Service: Cardiovascular;  Laterality: N/A;   REDUCTION MAMMAPLASTY  1990   RIGHT OOPHORECTOMY     TOTAL HIP ARTHROPLASTY Left 05/01/2014   Dr. [name]   TOTAL HIP ARTHROPLASTY Right 08/05/2014   Procedure: TOTAL HIP ARTHROPLASTY ANTERIOR APPROACH;  Surgeon: [name], MD;  Location: [facility];  Service: Orthopedics;  Laterality: Right;   ULNAR NERVE TRANSPOSITION Right 02/11/2016   Procedure: ULNAR NERVE DECOMPRESSION/TRANSPOSITION;  Surgeon: [name], MD;  Location: [facility];  Service: Orthopedics;  Laterality: Right;   VIDEO BRONCHOSCOPY WITH ENDOBRONCHIAL ULTRASOUND Left 12/19/2018   Procedure: VIDEO BRONCHOSCOPY WITH ENDOBRONCHIAL ULTRASOUND, LEFT, SLEEP APNEA;  Surgeon: [name], MD;  Location: [facility];  Service: Thoracic;  Laterality: Left;    OB History   No obstetric history on file.      Home Medications    Prior to Admission medications   Medication Sig Start Date End Date Taking? Authorizing Provider  albuterol (PROVENTIL) (2.5 MG/3ML) 0.083% nebulizer solution Take 2.5 mg by nebulization every 6 (six) hours as needed for wheezing.    [provider]  albuterol (VENTOLIN HFA) 108 (90 Base) MCG/ACT inhaler Inhale 2 puffs into the lungs every 6 (six) hours as needed for wheezing or shortness of breath. 10/18/18   Katy Apo, NP  azithromycin (ZITHROMAX) 250 MG tablet Take 2 tablets on day 1 (500 mg) and 1 tablet (250 mg) until complete. 11/27/20   Jacquelin Hawking, NP  Bioflavonoid Products (ESTER C PO) Take 1 tablet by mouth 3 (three) times daily.    [provider]  Calcium-Magnesium-Zinc (CAL-MAG-ZINC PO) Take 1 tablet by mouth daily.    [provider]  cetirizine (ZYRTEC) 5 MG tablet Take 1 tablet (5 mg total) by mouth daily. 04/17/18   Cook, Jayce G, DO  CHLORASEPTIC MAX SORE THROAT 1.5-33 % LIQD Use as directed 1 spray in the mouth or throat as needed (throat irritation.).     [provider]  Cholecalciferol (EQL VITAMIN D3) 25 MCG (1000 UT) capsule Take 4,000 Units by mouth daily.    [provider]  Clotrimazole 1 % OINT Apply 1 application topically in the morning, at noon, and at bedtime. 12/19/19   Sindy Guadeloupe, MD  Coenzyme Q10 (CO Q 10) 100 MG CAPS Take 100 mg by mouth daily.     [provider]  Cyanocobalamin (VITAMIN B-12) 2500 MCG SUBL Place 2,500 mcg under the tongue daily.    [provider]  cyclobenzaprine (FLEXERIL) 10 MG tablet Take 10 mg by mouth at bedtime.    [provider]  DHEA 25 MG CAPS Take 25 mg by mouth daily.    [provider]  Ensure (ENSURE) Take 237 mLs by mouth 2 (two) times daily between meals.    [provider]  ezetimibe (ZETIA) 10 MG tablet Take 5 mg by mouth in the morning and at bedtime.  10/26/18   [provider]  folic acid (FOLVITE) 932 MCG tablet Take 400 mcg by mouth 2 (two) times daily.     [provider]  Garlic 3557 MG CAPS Take 1,000 mg by mouth daily.    [provider]  guaiFENesin-dextromethorphan (ROBITUSSIN DM) 100-10 MG/5ML syrup Take 5 mLs by mouth every 4 (four) hours as needed for cough.    [provider]  Histamine Dihydrochloride (AUSTRALIAN DREAM ARTHRITIS EX) Apply 1 application topically 4 (four) times daily as needed (pain.).    [provider]  HYDROcodone-acetaminophen (NORCO/VICODIN) 5-325 MG tablet 1-2 tabs po bid prn Patient taking differently: Take 1 tablet by mouth at bedtime. 12/09/18   Norval Gable, MD  ipratropium (ATROVENT) 0.03 % nasal spray SMARTSIG:1-2  Spray(s) Both Nares 3 Times Daily PRN 04/27/20   [provider]  lidocaine-prilocaine (EMLA) cream lidocaine-prilocaine 2.5 %-2.5 % topical cream    [provider]  meclizine (ANTIVERT) 25 MG tablet Take 1 tablet (25 mg total) by mouth every 6 (six) hours as needed for dizziness. 06/11/19   Sindy Guadeloupe, MD  montelukast (SINGULAIR) 10 MG tablet Take 10 mg by mouth at bedtime.  11/22/18   [provider]  ondansetron (ZOFRAN-ODT) 4 MG disintegrating tablet Take 4 mg by mouth every 8 (eight) hours as needed for nausea/vomiting. 08/27/18   [provider]  Polyethyl Glycol-Propyl Glycol (SYSTANE) 0.4-0.3 % SOLN Place 1 drop into both eyes 5 (five) times daily as needed (dry/irritated eyes).     [provider]  pyridOXINE (VITAMIN B-6) 100 MG tablet Take 100 mg by mouth daily.    [provider]  Selenium 200 MCG CAPS Take 200 mcg by mouth daily.    [provider]  sucralfate (CARAFATE) 1 g tablet 1 tablet 3 (three) times daily as needed.  02/13/19   [provider]  triamcinolone (KENALOG) 0.1 % Apply 1 application topically 2 (two) times daily. 03/24/20   Margarette Canada, NP  UBRELVY 100 MG TABS Take by mouth. 01/30/20   [provider]  Vitamin A 2250 MCG (7500 UT) CAPS Take 1 capsule by mouth daily.     [provider]  vitamin E 400 UNIT capsule Take 400 Units by mouth daily.    [provider]  Zoledronic Acid (RECLAST IV) Inject 1 Dose into the vein as directed. ONCE A YEAR    [provider]  prochlorperazine (COMPAZINE) 10 MG tablet TAKE 1 TABLET(10 MG) BY MOUTH EVERY 6 HOURS AS NEEDED FOR NAUSEA OR VOMITING 01/06/19 03/22/19  Sindy Guadeloupe, MD    Family History Family History  Problem Relation Age of Onset   Heart disease Mother        s/p stent   Hypertension Mother    Hypercholesterolemia Mother    Diabetes Father    Stomach cancer Other        uncle   Breast cancer Cousin     Breast cancer Paternal Aunt     Social History Social History   Tobacco Use   Smoking status: Former    Types: Cigarettes    Quit date: 06/13/2007    Years since quitting: 13.5   Smokeless tobacco: Never  Vaping Use   Vaping Use: Never used  Substance Use Topics   Alcohol use: No    Alcohol/week: 0.0 standard drinks   Drug use: No     Allergies   Aspirin, Celebrex [celecoxib], Morphine and related, Adhesive [tape], Clarithromycin, Codeine, Darvon [propoxyphene hcl], Demerol [meperidine], Flonase [fluticasone propionate], Simvastatin, and Talwin [pentazocine]   Review of Systems Review of Systems  Constitutional:  Positive for fatigue. Negative for chills, diaphoresis and fever.  HENT:  Negative for congestion, ear pain, rhinorrhea and sore throat.   Respiratory:  Positive for cough, chest tightness, shortness of breath and wheezing.   Cardiovascular:  Positive for chest pain.  Gastrointestinal:  Negative for abdominal pain, nausea and vomiting.  Musculoskeletal:  Negative for arthralgias and myalgias.  Skin:  Negative for rash.  Neurological:  Negative for weakness and headaches.  Hematological:  Negative for adenopathy.    Physical Exam Triage Vital Signs ED Triage Vitals  Enc Vitals Group     BP --      Pulse Rate 01/05/21 1614 (!) 103     Resp 01/05/21 1614 16     Temp 01/05/21 1618 98.8 F (37.1 C)     Temp Source 01/05/21 1614 Oral     SpO2 01/05/21 1614 99 %     Weight --      Height --      Head Circumference --      Peak Flow --      Pain Score --  Pain Loc --      Pain Edu? --      Excl. in [place]? --    No data found.  Updated Vital Signs Pulse (!) 103   Temp 98.8 F (37.1 C) (Oral)   Resp 16   SpO2 (!) 87%     Physical Exam Vitals and nursing note reviewed.  Constitutional:      General: She is in acute distress.     Appearance: Normal appearance. She is well-developed. She is not ill-appearing or toxic-appearing.  HENT:     Head:  Normocephalic and atraumatic.     Nose: Nose normal.     Mouth/Throat:     Mouth: Mucous membranes are moist.     Pharynx: Oropharynx is clear.  Eyes:     General: No scleral icterus.       Right eye: No discharge.        Left eye: No discharge.     Conjunctiva/sclera: Conjunctivae normal.  Cardiovascular:     Rate and Rhythm: Regular rhythm. Tachycardia present.     Heart sounds: Normal heart sounds.  Pulmonary:     Effort: Respiratory distress (Patient is in obvious respiratory distress, use of accessory muscles, pauses to take breaths when speaking, can only speak 2-3 words at a time) present.     Breath sounds: Wheezing (diffuse wheezing throughout all lung fields) present.  Musculoskeletal:     Cervical back: Neck supple.  Skin:    General: Skin is dry.  Neurological:     General: No focal deficit present.     Mental Status: She is alert. Mental status is at baseline.     Motor: No weakness.     Gait: Gait normal.  Psychiatric:        Mood and Affect: Mood normal.        Behavior: Behavior normal.        Thought Content: Thought content normal.     UC Treatments / Results  Labs (all labs ordered are listed, but only abnormal results are displayed) Labs Reviewed - No data to display  EKG   Radiology DG Chest 2 View  Result Date: 01/05/2021 CLINICAL DATA:  Patient complains of persistent cough, wheezing, congestion, weakness and shortness of breaths for 1.5 months. Patient reports recent bronchitis and malignant neoplasm of left upper lobe of the lung. History of asthma, porta cath and chronic obstructive pulmonary disease. Former smoker. EXAM: CHEST - 2 VIEW COMPARISON:  CT chest-abdomen-pelvis 11/17/2020; X-ray chest 02/18/2015. FINDINGS: Right-sided chest port remains in place with distal tip terminating at the level of the distal SVC. Normal heart size. Atherosclerotic calcification of the aortic knob. Chronic left suprahilar/apical scarring. New left apical opacity  compared to the previous CT. Right lung is clear. No pleural effusion or pneumothorax. IMPRESSION: New left apical airspace opacity, which may represent pneumonia. Recurrent/progressive malignancy not excluded given proximity to the post treatment changes within the left suprahilar/apical region. Radiographic follow-up is recommended to ensure resolution. Electronically Signed   By: Davina Poke D.O.   On: 01/05/2021 15:29    Procedures Procedures (including critical care time)  Medications Ordered in UC Medications - No data to display  Initial Impression / Assessment and Plan / UC Course  I have reviewed the triage vital signs and the nursing notes.  Pertinent labs & imaging results that were available during my care of the patient were reviewed by me and considered in my medical decision making (see chart for details).  75 year old female with history of COPD, asthma, bronchiectasis and lung cancer presents for over 1 month history of productive cough and worsening shortness of breath.  Patient has already been treated with doxycycline, Levaquin and albuterol without any improvement in her symptoms and reports progressive worsening.  Patient's oxygen is 87% upon arrival and she is in obvious respiratory distress with use of accessory muscles and frequent pauses every couple of words to breathe.  She has diffuse wheezing throughout all lung fields.  I did review her recent chest x-ray from yesterday which shows possible left upper lobe pneumonia versus worsening of her malignancy.  Patient has hypoxic and obviously in respiratory distress and with her multiple comorbidities, advised patient that she needs to be seen, evaluated and treated in the emergency department this time.  Advised that she go via EMS.  Patient is reluctant but does agree to go to ED. I suspect patient will be admitted for respiratory failure.  Report given to EMS.  Patient leaving in stable condition.  Final Clinical  Impressions(s) / UC Diagnoses   Final diagnoses:  Respiratory distress  Malignant neoplasm of upper lobe of left lung ([place])  Hypoxia     Discharge Instructions      You have been advised to follow up immediately in the emergency department for concerning signs.symptoms. If you declined EMS transport, please have a family member take you directly to the ED at this time. Do not delay. Based on concerns about condition, if you do not follow up in th e ED, you may risk poor outcomes including worsening of condition, delayed treatment and potentially life threatening issues. If you have declined to go to the ED at this time, you should call your PCP immediately to set up a follow up appointment.  Go to ED for red flag symptoms, including; fevers you cannot reduce with Tylenol/Motrin, severe headaches, vision changes, numbness/weakness in part of the body, lethargy, confusion, intractable vomiting, severe dehydration, chest pain, breathing difficulty, severe persistent abdominal or pelvic pain, signs of severe infection (increased redness, swelling of an area), feeling faint or passing out, dizziness, etc. You should especially go to the ED for sudden acute worsening of condition if you do not elect to go at this time.      ED Prescriptions   None    PDMP not reviewed this encounter.   Danton Clap, PA-C 01/05/21 1652

## 2021-01-05 NOTE — Discharge Instructions (Signed)
You have been advised to follow up immediately in the emergency department for concerning signs.symptoms. If you declined EMS transport, please have a family member take you directly to the ED at this time. Do not delay. Based on concerns about condition, if you do not follow up in th e ED, you may risk poor outcomes including worsening of condition, delayed treatment and potentially life threatening issues. If you have declined to go to the ED at this time, you should call your PCP immediately to set up a follow up appointment. ° °Go to ED for red flag symptoms, including; fevers you cannot reduce with Tylenol/Motrin, severe headaches, vision changes, numbness/weakness in part of the body, lethargy, confusion, intractable vomiting, severe dehydration, chest pain, breathing difficulty, severe persistent abdominal or pelvic pain, signs of severe infection (increased redness, swelling of an area), feeling faint or passing out, dizziness, etc. You should especially go to the ED for sudden acute worsening of condition if you do not elect to go at this time.  °

## 2021-01-06 ENCOUNTER — Emergency Department: Payer: Medicare Other

## 2021-01-06 ENCOUNTER — Encounter: Payer: Self-pay | Admitting: Oncology

## 2021-01-06 ENCOUNTER — Inpatient Hospital Stay
Admission: EM | Admit: 2021-01-06 | Discharge: 2021-01-12 | DRG: 195 | Disposition: A | Discharge status: Home / Self Care | Payer: Medicare Other | Attending: Internal Medicine | Admitting: Internal Medicine

## 2021-01-06 DIAGNOSIS — C3412 Malignant neoplasm of upper lobe, left bronchus or lung: Secondary | ICD-10-CM | POA: Diagnosis present

## 2021-01-06 DIAGNOSIS — Z20822 Contact with and (suspected) exposure to covid-19: Secondary | ICD-10-CM | POA: Diagnosis present

## 2021-01-06 DIAGNOSIS — Z888 Allergy status to other drugs, medicaments and biological substances status: Secondary | ICD-10-CM | POA: Diagnosis not present

## 2021-01-06 DIAGNOSIS — I700 Atherosclerosis of aorta: Secondary | ICD-10-CM | POA: Diagnosis present

## 2021-01-06 DIAGNOSIS — Z923 Personal history of irradiation: Secondary | ICD-10-CM | POA: Diagnosis not present

## 2021-01-06 DIAGNOSIS — Z885 Allergy status to narcotic agent status: Secondary | ICD-10-CM | POA: Diagnosis not present

## 2021-01-06 DIAGNOSIS — Z83438 Family history of other disorder of lipoprotein metabolism and other lipidemia: Secondary | ICD-10-CM | POA: Diagnosis not present

## 2021-01-06 DIAGNOSIS — Z9841 Cataract extraction status, right eye: Secondary | ICD-10-CM | POA: Diagnosis not present

## 2021-01-06 DIAGNOSIS — Z800 Family history of malignant neoplasm of digestive organs: Secondary | ICD-10-CM | POA: Diagnosis not present

## 2021-01-06 DIAGNOSIS — Z961 Presence of intraocular lens: Secondary | ICD-10-CM | POA: Diagnosis present

## 2021-01-06 DIAGNOSIS — Z981 Arthrodesis status: Secondary | ICD-10-CM | POA: Diagnosis not present

## 2021-01-06 DIAGNOSIS — Z803 Family history of malignant neoplasm of breast: Secondary | ICD-10-CM | POA: Diagnosis not present

## 2021-01-06 DIAGNOSIS — I10 Essential (primary) hypertension: Secondary | ICD-10-CM | POA: Diagnosis present

## 2021-01-06 DIAGNOSIS — Z9221 Personal history of antineoplastic chemotherapy: Secondary | ICD-10-CM | POA: Diagnosis not present

## 2021-01-06 DIAGNOSIS — Z833 Family history of diabetes mellitus: Secondary | ICD-10-CM | POA: Diagnosis not present

## 2021-01-06 DIAGNOSIS — Z87891 Personal history of nicotine dependence: Secondary | ICD-10-CM | POA: Diagnosis not present

## 2021-01-06 DIAGNOSIS — J9601 Acute respiratory failure with hypoxia: Secondary | ICD-10-CM

## 2021-01-06 DIAGNOSIS — G4730 Sleep apnea, unspecified: Secondary | ICD-10-CM | POA: Diagnosis present

## 2021-01-06 DIAGNOSIS — J189 Pneumonia, unspecified organism: Secondary | ICD-10-CM | POA: Diagnosis present

## 2021-01-06 DIAGNOSIS — Z96643 Presence of artificial hip joint, bilateral: Secondary | ICD-10-CM | POA: Diagnosis present

## 2021-01-06 DIAGNOSIS — G4733 Obstructive sleep apnea (adult) (pediatric): Secondary | ICD-10-CM | POA: Diagnosis present

## 2021-01-06 DIAGNOSIS — Z9842 Cataract extraction status, left eye: Secondary | ICD-10-CM | POA: Diagnosis not present

## 2021-01-06 DIAGNOSIS — Z8249 Family history of ischemic heart disease and other diseases of the circulatory system: Secondary | ICD-10-CM | POA: Diagnosis not present

## 2021-01-06 DIAGNOSIS — K219 Gastro-esophageal reflux disease without esophagitis: Secondary | ICD-10-CM | POA: Diagnosis present

## 2021-01-06 DIAGNOSIS — E7800 Pure hypercholesterolemia, unspecified: Secondary | ICD-10-CM | POA: Diagnosis present

## 2021-01-06 DIAGNOSIS — Z66 Do not resuscitate: Secondary | ICD-10-CM | POA: Diagnosis present

## 2021-01-06 DIAGNOSIS — Z85118 Personal history of other malignant neoplasm of bronchus and lung: Secondary | ICD-10-CM | POA: Diagnosis not present

## 2021-01-06 LAB — EXPECTORATED SPUTUM ASSESSMENT W GRAM STAIN, RFLX TO RESP C

## 2021-01-06 LAB — HIV ANTIBODY (ROUTINE TESTING W REFLEX): HIV Screen 4th Generation wRfx: NONREACTIVE

## 2021-01-06 MED ORDER — ACETYLCYSTEINE 20 % IN SOLN
2.0000 mL | Freq: Two times a day (BID) | RESPIRATORY_TRACT | Status: DC
  Administered 2021-01-06 – 2021-01-09 (×5): 2 mL via RESPIRATORY_TRACT
  Filled 2021-01-06 (×8): qty 4

## 2021-01-06 MED ORDER — HYDROCODONE-ACETAMINOPHEN 5-325 MG PO TABS: 1.0000 | ORAL_TABLET | Freq: Every day | ORAL | Status: DC

## 2021-01-06 MED ORDER — IPRATROPIUM-ALBUTEROL 0.5-2.5 (3) MG/3ML IN SOLN
3.0000 mL | Freq: Once | RESPIRATORY_TRACT | Status: AC
  Administered 2021-01-06: 3 mL via RESPIRATORY_TRACT
  Filled 2021-01-06: qty 3

## 2021-01-06 MED ORDER — BENZONATATE 100 MG PO CAPS
200.0000 mg | ORAL_CAPSULE | Freq: Three times a day (TID) | ORAL | Status: DC
  Administered 2021-01-06 – 2021-01-12 (×19): 200 mg via ORAL
  Filled 2021-01-06 (×19): qty 2

## 2021-01-06 MED ORDER — AZITHROMYCIN 500 MG IV SOLR
500.0000 mg | INTRAVENOUS | Status: AC
Start: 2021-01-06 — End: 2021-01-10
  Administered 2021-01-06 – 2021-01-10 (×5): 500 mg via INTRAVENOUS
  Filled 2021-01-06 (×5): qty 500

## 2021-01-06 MED ORDER — METHYLPREDNISOLONE SODIUM SUCC 125 MG IJ SOLR
125.0000 mg | Freq: Once | INTRAMUSCULAR | Status: AC
  Administered 2021-01-06: 125 mg via INTRAVENOUS
  Filled 2021-01-06: qty 2

## 2021-01-06 MED ORDER — VANCOMYCIN HCL 1750 MG/350ML IV SOLN
1750.0000 mg | Freq: Once | INTRAVENOUS | Status: AC
  Administered 2021-01-06: 1750 mg via INTRAVENOUS
  Filled 2021-01-06 (×2): qty 350

## 2021-01-06 MED ORDER — CEFTRIAXONE SODIUM 2 G IJ SOLR
2.0000 g | INTRAMUSCULAR | Status: AC
  Administered 2021-01-06 – 2021-01-10 (×5): 2 g via INTRAVENOUS
  Filled 2021-01-06: qty 2
  Filled 2021-01-06: qty 20
  Filled 2021-01-06 (×2): qty 2
  Filled 2021-01-06: qty 20

## 2021-01-06 MED ORDER — ENOXAPARIN SODIUM 40 MG/0.4ML IJ SOSY
40.0000 mg | PREFILLED_SYRINGE | INTRAMUSCULAR | Status: DC
  Administered 2021-01-06 – 2021-01-11 (×6): 40 mg via SUBCUTANEOUS
  Filled 2021-01-06 (×6): qty 0.4

## 2021-01-06 MED ORDER — MECLIZINE HCL 25 MG PO TABS
25.0000 mg | ORAL_TABLET | Freq: Four times a day (QID) | ORAL | Status: DC | PRN
  Filled 2021-01-06: qty 1

## 2021-01-06 MED ORDER — VITAMIN D3 25 MCG (1000 UNIT) PO TABS
4000.0000 [IU] | ORAL_TABLET | Freq: Every day | ORAL | Status: DC
  Administered 2021-01-06 – 2021-01-12 (×8): 4000 [IU] via ORAL
  Filled 2021-01-06 (×14): qty 4

## 2021-01-06 MED ORDER — POLYETHYLENE GLYCOL 3350 17 G PO PACK
17.0000 g | PACK | Freq: Every day | ORAL | Status: DC
  Administered 2021-01-06 – 2021-01-11 (×5): 17 g via ORAL
  Filled 2021-01-06 (×7): qty 1

## 2021-01-06 MED ORDER — UBROGEPANT 100 MG PO TABS: ORAL_TABLET | Freq: Every day | ORAL | Status: DC

## 2021-01-06 MED ORDER — MONTELUKAST SODIUM 10 MG PO TABS
10.0000 mg | ORAL_TABLET | Freq: Every day | ORAL | Status: DC
  Administered 2021-01-06 – 2021-01-11 (×6): 10 mg via ORAL
  Filled 2021-01-06 (×6): qty 1

## 2021-01-06 MED ORDER — HYDROCODONE-ACETAMINOPHEN 5-325 MG PO TABS
1.0000 | ORAL_TABLET | Freq: Once | ORAL | Status: AC
  Administered 2021-01-06: 1 via ORAL
  Filled 2021-01-06: qty 1

## 2021-01-06 MED ORDER — CYCLOBENZAPRINE HCL 10 MG PO TABS
10.0000 mg | ORAL_TABLET | Freq: Every day | ORAL | Status: DC
  Administered 2021-01-06 – 2021-01-11 (×6): 10 mg via ORAL
  Filled 2021-01-06 (×6): qty 1

## 2021-01-06 MED ORDER — VITAMIN B-12 2500 MCG SL SUBL: 2500.0000 ug | SUBLINGUAL_TABLET | Freq: Every day | SUBLINGUAL | Status: DC

## 2021-01-06 MED ORDER — LORATADINE 10 MG PO TABS
10.0000 mg | ORAL_TABLET | Freq: Every day | ORAL | Status: DC
  Administered 2021-01-06 – 2021-01-12 (×7): 10 mg via ORAL
  Filled 2021-01-06 (×7): qty 1

## 2021-01-06 MED ORDER — VITAMIN B-12 1000 MCG PO TABS
2500.0000 ug | ORAL_TABLET | Freq: Every day | ORAL | Status: DC
  Administered 2021-01-06 – 2021-01-12 (×7): 2500 ug via ORAL
  Filled 2021-01-06 (×2): qty 1
  Filled 2021-01-06 (×4): qty 3
  Filled 2021-01-06: qty 1
  Filled 2021-01-06: qty 3

## 2021-01-06 MED ORDER — ALBUTEROL SULFATE (2.5 MG/3ML) 0.083% IN NEBU
2.5000 mg | INHALATION_SOLUTION | Freq: Four times a day (QID) | RESPIRATORY_TRACT | Status: DC | PRN
  Administered 2021-01-06 – 2021-01-10 (×8): 2.5 mg via RESPIRATORY_TRACT
  Filled 2021-01-06 (×8): qty 3

## 2021-01-06 MED ORDER — HYDROCODONE-ACETAMINOPHEN 5-325 MG PO TABS
1.0000 | ORAL_TABLET | Freq: Every day | ORAL | Status: DC
  Administered 2021-01-06 – 2021-01-07 (×2): 1 via ORAL
  Filled 2021-01-06 (×3): qty 1

## 2021-01-06 MED ORDER — HYDROCOD POLST-CPM POLST ER 10-8 MG/5ML PO SUER
5.0000 mL | Freq: Two times a day (BID) | ORAL | Status: DC | PRN
  Administered 2021-01-06 – 2021-01-08 (×3): 5 mL via ORAL
  Filled 2021-01-06 (×3): qty 5

## 2021-01-06 MED ORDER — EZETIMIBE 10 MG PO TABS
5.0000 mg | ORAL_TABLET | Freq: Every day | ORAL | Status: DC
  Administered 2021-01-06 – 2021-01-12 (×7): 5 mg via ORAL
  Filled 2021-01-06 (×7): qty 0.5

## 2021-01-06 MED ORDER — HYDROCODONE-ACETAMINOPHEN 5-325 MG PO TABS: 1.0000 | ORAL_TABLET | Freq: Four times a day (QID) | ORAL | Status: DC | PRN

## 2021-01-06 MED ORDER — SODIUM CHLORIDE 0.9 % IV SOLN
1.0000 g | Freq: Once | INTRAVENOUS | Status: AC
  Administered 2021-01-06: 1 g via INTRAVENOUS
  Filled 2021-01-06: qty 1

## 2021-01-06 MED ORDER — FOLIC ACID 400 MCG PO TABS: 400.0000 ug | ORAL_TABLET | Freq: Two times a day (BID) | ORAL | Status: DC

## 2021-01-06 MED ORDER — VITAMIN B-6 50 MG PO TABS
100.0000 mg | ORAL_TABLET | Freq: Every day | ORAL | Status: DC
  Administered 2021-01-07 – 2021-01-12 (×6): 100 mg via ORAL
  Filled 2021-01-06 (×6): qty 2

## 2021-01-06 MED ORDER — FOLIC ACID 1 MG PO TABS
1.0000 mg | ORAL_TABLET | Freq: Two times a day (BID) | ORAL | Status: DC
  Administered 2021-01-06 – 2021-01-12 (×13): 1 mg via ORAL
  Filled 2021-01-06 (×13): qty 1

## 2021-01-06 MED ORDER — IOHEXOL 350 MG/ML SOLN
75.0000 mL | Freq: Once | INTRAVENOUS | Status: AC | PRN
  Administered 2021-01-06: 75 mL via INTRAVENOUS
  Filled 2021-01-06: qty 75

## 2021-01-06 NOTE — Progress Notes (Signed)
PHARMACY -  BRIEF ANTIBIOTIC NOTE   Pharmacy has received consult(s) for Vancomycin from an ED provider.  The patient's profile has been reviewed for ht/wt/allergies/indication/available labs.    One time order(s) placed for Vancomycin 1750 mg IV X 1  Further antibiotics/pharmacy consults should be ordered by admitting physician if indicated.                       Thank you, Keiston Manley D 01/06/2021  6:40 AM

## 2021-01-06 NOTE — Progress Notes (Signed)
Attempted to give patient PO pain medicine however patient was asleep.

## 2021-01-06 NOTE — Consult Note (Signed)
Hematology/Oncology Consult note [facility] Telephone:([phone] [phone]  Patient Care Team: [name], MD as PCP - General (Family Medicine) Telford Nab, RN as Registered Nurse Noreene Filbert, MD as Referring Physician (Radiation Oncology) Sindy Guadeloupe, MD as Consulting Physician (Oncology)   Name of the patient: Jennifer Hodges  [id number]  October 04, 1945    Reason for consult: Community-acquired pneumonia in the setting of lung cancer   Requesting physician: Dr. Francine Graven  Date of visit: 01/06/2021    History of presenting illness- Patient is a 75 year old female diagnosed with stage IIb non-small cell lung cancer adenocarcinoma in September 2020.  She is s/p concurrent chemoradiation followed by maintenance durvalumab which she completed in February 2022.  She has been in remission since then.  She does get intermittent episodes of worsening shortness of breath and has been treated in the outpatient setting for community-acquired pneumonia in the past.  She follows up with Dr. [name] from pulmonary as well.  At baseline she does not require any oxygen.  Patient reports worsening shortness of breath especially over the last 3 to 4 weeks.  She started noticing she was getting fatigued and short of breath even when she walked to the bathroom and had worsening symptoms of wheezing.  She was given Levaquin by Dr. [name] which did not help and patient continued to feel worse and therefore came to the ER.  CT angio chest in the ER showed no evidence of PE.  Development of atelectasis and airspace consolidation in the left upper lobe concerning for postobstructive pneumonia.  No centrally obstructing lesion identified.  This could be secondary to chronic postradiation fibrosis.  Patient is currently on IV antibiotics azithromycin as well as ceftriaxone.  Patient reports feeling better since she has been in the ER.  ECOG PS- 2  Pain scale-  0   Review of systems- Review of Systems  Constitutional:  Positive for malaise/fatigue. Negative for chills, fever and weight loss.  HENT:  Negative for congestion, ear discharge and nosebleeds.   Eyes:  Negative for blurred vision.  Respiratory:  Positive for shortness of breath. Negative for cough, hemoptysis, sputum production and wheezing.   Cardiovascular:  Negative for chest pain, palpitations, orthopnea and claudication.  Gastrointestinal:  Negative for abdominal pain, blood in stool, constipation, diarrhea, heartburn, melena, nausea and vomiting.  Genitourinary:  Negative for dysuria, flank pain, frequency, hematuria and urgency.  Musculoskeletal:  Negative for back pain, joint pain and myalgias.  Skin:  Negative for rash.  Neurological:  Negative for dizziness, tingling, focal weakness, seizures, weakness and headaches.  Endo/Heme/Allergies:  Does not bruise/bleed easily.  Psychiatric/Behavioral:  Negative for depression and suicidal ideas. The patient does not have insomnia.    Allergies  Allergen Reactions   Aspirin Hives and Other (See Comments)    Difficulty breathing   Celebrex [Celecoxib] Shortness Of Breath   Morphine And Related Nausea And Vomiting and Swelling   Adhesive [Tape] Other (See Comments)    Took top layer of skin off when removed.   Clarithromycin Nausea And Vomiting   Codeine Nausea And Vomiting   Darvon [Propoxyphene Hcl] Nausea And Vomiting   Demerol [Meperidine] Nausea And Vomiting   Flonase [Fluticasone Propionate] Other (See Comments)    Fungal infection   Simvastatin Other (See Comments)    "caused ulcers in mouth, and fever"   Talwin [Pentazocine] Nausea And Vomiting    Patient Active Problem List   Diagnosis Date Noted   Postobstructive pneumonia 01/06/2021  Benign breast lumps 01/11/2019   Gout 01/11/2019   Hives 01/11/2019   Lung disease 01/11/2019   Migraine headache 01/11/2019   Goals of care, counseling/discussion 12/27/2018    Malignant neoplasm of upper lobe of left lung ([place]) 12/27/2018   Night sweats 08/07/2016   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   Osteopenia of multiple sites 04/27/2016   Left carpal tunnel syndrome 02/02/2016   Carotid stenosis, asymptomatic, bilateral 12/29/2015   Prediabetes 09/28/2015   Primary osteoarthritis involving multiple joints 09/28/2015   Knee pain 05/11/2015   Chest tightness 03/24/2015   Stool incontinence 03/24/2015   Urinary frequency 03/04/2015   Cervical spondylosis with radiculopathy 02/24/2015   Pre-syncope 02/19/2015   URI (upper respiratory infection) 02/15/2015   Neck pain 12/25/2014   Pelvic pain in female 11/16/2014   Primary osteoarthritis of hip 08/05/2014   Heel pain 06/01/2014   Diarrhea 06/01/2014   Health care maintenance 06/01/2014   Right leg pain 03/30/2014   Right arm pain 03/30/2014   Sinusitis 03/30/2014   Internal nasal lesion 11/12/2013   Other specified disorders of nose and nasal sinuses 11/12/2013   Chronic tension-type headache, intractable 10/30/2013   Intractable migraine without aura and without status migrainosus 10/30/2013   Occipital neuralgia of left side 10/30/2013   IBS (irritable bowel syndrome) 09/03/2013   Rib pain 08/04/2013   Cervical radiculitis 08/01/2013   Lumbar radiculitis 08/01/2013   Palpitations 07/25/2013   Benign essential hypertension 05/28/2013   Neuralgia, neuritis, and radiculitis, unspecified 05/28/2013   Vitamin D deficiency 05/28/2013   Elevated AFP 04/28/2013   Abnormality of alpha-fetoprotein 04/28/2013   Osteoporosis 11/12/2012   Incomplete emptying of bladder 07/11/2012   Prolapse of vaginal vault after hysterectomy 07/11/2012   Nonspecific (abnormal) findings on radiological and other examination of gastrointestinal tract 05/31/2012   Bronchiectasis ([place]) 02/05/2012   Sleep apnea 02/05/2012   Degenerative disc disease, cervical 02/05/2012   Hypercholesterolemia 02/05/2012   GERD  (gastroesophageal reflux disease) 02/05/2012   Chronic headaches 02/05/2012     Past Medical History:  Diagnosis Date   Anemia    Asthma    No Inhalers--Dr. [name] will order as needed   Bronchiectasis ([place])    mild   Chronic headaches     followed by [facility] migraines   COPD (chronic obstructive pulmonary disease) ([place])    DDD (degenerative disc disease), lumbar    Diverticulosis    Family history of adverse reaction to anesthesia    PONV   Gall stones    history of   GERD (gastroesophageal reflux disease)    EGD 8/09- non bleeding erosive gastritis, documentd esophageal ulcerations.    Hiatal hernia    History of pneumonia    Hypercholesterolemia    IBS (irritable bowel syndrome)    Malignant neoplasm of upper lobe of left lung ([place]) 12/27/2018   Meniere disease    Murmur    Osteoarthritis    lumbar disc disease, left hip   Personal history of chemotherapy    Personal history of radiation therapy    PONV (postoperative nausea and vomiting)    "Only with last hip and I believe it was due to the morphine"    Sleep apnea    cpap asked to bring mask and tubing   Vertigo    Weakness of right side of body      Past Surgical History:  Procedure Laterality Date   ABDOMINAL HYSTERECTOMY  age 5   ANTERIOR CERVICAL DECOMP/DISCECTOMY FUSION N/A 02/24/2015   Procedure:  CERVICAL FOUR-FIVE, CERVICAL FIVE-SIX, CERVICAL SIX-SEVEN ANTERIOR CERVICAL DECOMPRESSION/DISCECTOMY FUSION ;  Surgeon: [name], MD;  Location: [facility];  Service: Neurosurgery;  Laterality: N/A;  C45 C56 C67 anterior cervical decompression with fusion interbody prosthesis plating and bonegraft   APPENDECTOMY     BACK SURGERY     4th lumbar fusion   BLADDER SURGERY N/A    with vaginal wall repair   BREAST CYST ASPIRATION Bilateral    neg   BREAST SURGERY Bilateral    cyst removed and reduction   CARDIAC CATHETERIZATION  2014   CARPAL TUNNEL RELEASE Right 02/11/2016   Procedure:  CARPAL TUNNEL RELEASE;  Surgeon: [name], MD;  Location: [facility];  Service: Orthopedics;  Laterality: Right;   CATARACT EXTRACTION W/ INTRAOCULAR LENS IMPLANT Bilateral 2015   CHOLECYSTECTOMY     EUS N/A 05/31/2012   Procedure: UPPER ENDOSCOPIC ULTRASOUND (EUS) LINEAR;  Surgeon: [name], MD;  Location: [facility];  Service: Endoscopy;  Laterality: N/A;   EXCISIONAL HEMORRHOIDECTOMY     JOINT REPLACEMENT Bilateral    KNEE ARTHROSCOPY WITH LATERAL MENISECTOMY Right 07/07/2015   Procedure: KNEE ARTHROSCOPY WITH LATERAL MENISECTOMY, PARTIAL SYNOVECTOMY;  Surgeon: [name], MD;  Location: [facility];  Service: Orthopedics;  Laterality: Right;   LUMBAR LAMINECTOMY     PORTA CATH INSERTION N/A 01/07/2019   Procedure: PORTA CATH INSERTION;  Surgeon: [name], MD;  Location: [facility];  Service: Cardiovascular;  Laterality: N/A;   REDUCTION MAMMAPLASTY  1990   RIGHT OOPHORECTOMY     TOTAL HIP ARTHROPLASTY Left 05/01/2014   Dr. [name]   TOTAL HIP ARTHROPLASTY Right 08/05/2014   Procedure: TOTAL HIP ARTHROPLASTY ANTERIOR APPROACH;  Surgeon: [name], MD;  Location: [facility];  Service: Orthopedics;  Laterality: Right;   ULNAR NERVE TRANSPOSITION Right 02/11/2016   Procedure: ULNAR NERVE DECOMPRESSION/TRANSPOSITION;  Surgeon: [name], MD;  Location: [facility];  Service: Orthopedics;  Laterality: Right;   VIDEO BRONCHOSCOPY WITH ENDOBRONCHIAL ULTRASOUND Left 12/19/2018   Procedure: VIDEO BRONCHOSCOPY WITH ENDOBRONCHIAL ULTRASOUND, LEFT, SLEEP APNEA;  Surgeon: [name], MD;  Location: [facility];  Service: Thoracic;  Laterality: Left;    Social History   Socioeconomic History   Marital status: Married    Spouse name: Not on file   Number of children: Not on file   Years of education: Not on file   Highest education level: Not on file  Occupational History   Not on file  Tobacco Use   Smoking status: Former    Types: Cigarettes    Quit date:  06/13/2007    Years since quitting: 13.5   Smokeless tobacco: Never  Vaping Use   Vaping Use: Never used  Substance and Sexual Activity   Alcohol use: No    Alcohol/week: 0.0 standard drinks   Drug use: No   Sexual activity: Never  Other Topics Concern   Not on file  Social History Narrative   Not on file   Social Determinants of Health   Financial Resource Strain: Not on file  Food Insecurity: Not on file  Transportation Needs: Not on file  Physical Activity: Not on file  Stress: Not on file  Social Connections: Not on file  Intimate Partner Violence: Not on file     Family History  Problem Relation Age of Onset   Heart disease Mother        s/p stent   Hypertension Mother    Hypercholesterolemia Mother    Diabetes Father  Stomach cancer Other        uncle   Breast cancer Cousin    Breast cancer Paternal Aunt      Current Facility-Administered Medications:    albuterol (PROVENTIL) (2.5 MG/3ML) 0.083% nebulizer solution 2.5 mg, 2.5 mg, Nebulization, Q6H PRN, Agbata, Tochukwu, MD   azithromycin (ZITHROMAX) 500 mg in sodium chloride 0.9 % 250 mL IVPB, 500 mg, Intravenous, Q24H, Agbata, Tochukwu, MD, Stopped at 01/06/21 1118   benzonatate (TESSALON) capsule 200 mg, 200 mg, Oral, TID, Agbata, Tochukwu, MD, 200 mg at 01/06/21 1531   cefTRIAXone (ROCEPHIN) 2 g in sodium chloride 0.9 % 100 mL IVPB, 2 g, Intravenous, Q24H, Agbata, Tochukwu, MD, Stopped at 01/06/21 1357   cholecalciferol (VITAMIN D) tablet 4,000 Units, 4,000 Units, Oral, Daily, Agbata, Tochukwu, MD, 4,000 Units at 01/06/21 1005   cyclobenzaprine (FLEXERIL) tablet 10 mg, 10 mg, Oral, QHS, Agbata, Tochukwu, MD   enoxaparin (LOVENOX) injection 40 mg, 40 mg, Subcutaneous, Q24H, Agbata, Tochukwu, MD   ezetimibe (ZETIA) tablet 5 mg, 5 mg, Oral, Daily, Agbata, Tochukwu, MD   folic acid (FOLVITE) tablet 1 mg, 1 mg, Oral, BID, Noralee Space, RPH, 1 mg at 01/06/21 1012   HYDROcodone-acetaminophen (NORCO/VICODIN)  5-325 MG per tablet 1 tablet, 1 tablet, Oral, QHS, Agbata, Tochukwu, MD   loratadine (CLARITIN) tablet 10 mg, 10 mg, Oral, Daily, Agbata, Tochukwu, MD, 10 mg at 01/06/21 1005   meclizine (ANTIVERT) tablet 25 mg, 25 mg, Oral, Q6H PRN, Agbata, Tochukwu, MD   montelukast (SINGULAIR) tablet 10 mg, 10 mg, Oral, QHS, Agbata, Tochukwu, MD   polyethylene glycol (MIRALAX / GLYCOLAX) packet 17 g, 17 g, Oral, Daily, Agbata, Tochukwu, MD, 17 g at 01/06/21 1006   [START ON 01/07/2021] pyridOXINE (VITAMIN B-6) tablet 100 mg, 100 mg, Oral, Daily, Agbata, Tochukwu, MD   Ubrogepant TABS, , Oral, Daily, Agbata, Tochukwu, MD   vitamin B-12 (CYANOCOBALAMIN) tablet 2,500 mcg, 2,500 mcg, Oral, Daily, Agbata, Tochukwu, MD  Current Outpatient Medications:    albuterol (PROVENTIL) (2.5 MG/3ML) 0.083% nebulizer solution, Take 2.5 mg by nebulization every 6 (six) hours as needed for wheezing., Disp: , Rfl:    albuterol (VENTOLIN HFA) 108 (90 Base) MCG/ACT inhaler, Inhale 2 puffs into the lungs every 6 (six) hours as needed for wheezing or shortness of breath., Disp: 18 g, Rfl: 0   benzonatate (TESSALON) 200 MG capsule, Take by mouth., Disp: , Rfl:    Bioflavonoid Products (ESTER C PO), Take 1 tablet by mouth 3 (three) times daily., Disp: , Rfl:    Calcium-Magnesium-Zinc (CAL-MAG-ZINC PO), Take 1 tablet by mouth daily., Disp: , Rfl:    cetirizine (ZYRTEC) 5 MG tablet, Take 1 tablet (5 mg total) by mouth daily., Disp: 30 tablet, Rfl: 0   CHLORASEPTIC MAX SORE THROAT 1.5-33 % LIQD, Use as directed 1 spray in the mouth or throat as needed (throat irritation.). , Disp: , Rfl:    Cholecalciferol (EQL VITAMIN D3) 25 MCG (1000 UT) capsule, Take 4,000 Units by mouth daily., Disp: , Rfl:    Clotrimazole 1 % OINT, Apply 1 application topically in the morning, at noon, and at bedtime., Disp: 56.7 g, Rfl: 0   Coenzyme Q10 (CO Q 10) 100 MG CAPS, Take 100 mg by mouth daily. , Disp: , Rfl:    Cyanocobalamin (VITAMIN B-12) 2500 MCG SUBL,  Place 2,500 mcg under the tongue daily., Disp: , Rfl:    cyclobenzaprine (FLEXERIL) 10 MG tablet, Take 10 mg by mouth at bedtime., Disp: , Rfl:  DHEA 25 MG CAPS, Take 25 mg by mouth daily., Disp: , Rfl:    ezetimibe (ZETIA) 10 MG tablet, Take 5 mg by mouth in the morning and at bedtime. , Disp: , Rfl:    folic acid (FOLVITE) 314 MCG tablet, Take 400 mcg by mouth 2 (two) times daily. , Disp: , Rfl:    Garlic 9702 MG CAPS, Take 1,000 mg by mouth daily., Disp: , Rfl:    guaiFENesin-dextromethorphan (ROBITUSSIN DM) 100-10 MG/5ML syrup, Take 5 mLs by mouth every 4 (four) hours as needed for cough., Disp: , Rfl:    Histamine Dihydrochloride (AUSTRALIAN DREAM ARTHRITIS EX), Apply 1 application topically 4 (four) times daily as needed (pain.)., Disp: , Rfl:    ipratropium (ATROVENT) 0.03 % nasal spray, SMARTSIG:1-2 Spray(s) Both Nares 3 Times Daily PRN, Disp: , Rfl:    lidocaine-prilocaine (EMLA) cream, lidocaine-prilocaine 2.5 %-2.5 % topical cream, Disp: , Rfl:    meclizine (ANTIVERT) 25 MG tablet, Take 1 tablet (25 mg total) by mouth every 6 (six) hours as needed for dizziness., Disp: 30 tablet, Rfl: 0   montelukast (SINGULAIR) 10 MG tablet, Take 10 mg by mouth at bedtime. , Disp: , Rfl:    ondansetron (ZOFRAN-ODT) 4 MG disintegrating tablet, Take 4 mg by mouth every 8 (eight) hours as needed for nausea/vomiting., Disp: , Rfl:    Polyethyl Glycol-Propyl Glycol (SYSTANE) 0.4-0.3 % SOLN, Place 1 drop into both eyes 5 (five) times daily as needed (dry/irritated eyes). , Disp: , Rfl:    pyridOXINE (VITAMIN B-6) 100 MG tablet, Take 100 mg by mouth daily., Disp: , Rfl:    Selenium 200 MCG CAPS, Take 200 mcg by mouth daily., Disp: , Rfl:    sucralfate (CARAFATE) 1 g tablet, 1 tablet 3 (three) times daily as needed. , Disp: , Rfl:    triamcinolone (KENALOG) 0.1 %, Apply 1 application topically 2 (two) times daily., Disp: 30 g, Rfl: 0   UBRELVY 100 MG TABS, Take by mouth., Disp: , Rfl:    Vitamin A 2250 MCG  (7500 UT) CAPS, Take 1 capsule by mouth daily. , Disp: , Rfl:    vitamin E 400 UNIT capsule, Take 400 Units by mouth daily., Disp: , Rfl:    Zoledronic Acid (RECLAST IV), Inject 1 Dose into the vein as directed. ONCE A YEAR, Disp: , Rfl:    azithromycin (ZITHROMAX) 250 MG tablet, Take 2 tablets on day 1 (500 mg) and 1 tablet (250 mg) until complete. (Patient not taking: No sig reported), Disp: 6 each, Rfl: 0   Ensure (ENSURE), Take 237 mLs by mouth 2 (two) times daily between meals., Disp: , Rfl:    HYDROcodone-acetaminophen (NORCO/VICODIN) 5-325 MG tablet, 1-2 tabs po bid prn (Patient taking differently: Take 1 tablet by mouth at bedtime.), Disp: 10 tablet, Rfl: 0   Physical exam:  Vitals:   01/06/21 0615 01/06/21 0636 01/06/21 1100 01/06/21 1300  BP: 140/72  138/69 120/77  Pulse: 99  (!) 109 93  Resp: 20  (!) 24 16  Temp:      TempSrc:      SpO2: 97%  92% 91%  Weight:  167 lb 8.8 oz (76 kg)     Physical Exam Constitutional:      Comments: Appears fatigued on oxygen  Cardiovascular:     Rate and Rhythm: Regular rhythm. Tachycardia present.     Heart sounds: Normal heart sounds.  Pulmonary:     Comments: Effort of breathing increased.  Breath sounds diminished on  the left side Abdominal:     General: Bowel sounds are normal.     Palpations: Abdomen is soft.  Skin:    General: Skin is warm and dry.  Neurological:     Mental Status: She is alert and oriented to person, place, and time.       CMP Latest Ref Rng & Units 01/05/2021  Glucose 70 - 99 mg/dL 130(H)  BUN 8 - 23 mg/dL 7(L)  Creatinine 0.44 - 1.00 mg/dL 0.37(L)  Sodium 135 - 145 mmol/L 139  Potassium 3.5 - 5.1 mmol/L 3.9  Chloride 98 - 111 mmol/L 103  CO2 22 - 32 mmol/L 28  Calcium 8.9 - 10.3 mg/dL 9.0  Total Protein 6.5 - 8.1 g/dL 7.2  Total Bilirubin 0.3 - 1.2 mg/dL 0.8  Alkaline Phos 38 - 126 U/L 76  AST 15 - 41 U/L 19  ALT 0 - 44 U/L 15   CBC Latest Ref Rng & Units 01/05/2021  WBC 4.0 - 10.5 K/uL 7.4   Hemoglobin 12.0 - 15.0 g/dL 13.7  Hematocrit 36.0 - 46.0 % 39.8  Platelets 150 - 400 K/uL 246    @IMAGES @  DG Chest 2 View  Result Date: 01/05/2021 CLINICAL DATA:  Shortness of breath EXAM: CHEST - 2 VIEW COMPARISON:  01/04/2021 FINDINGS: Right Port-A-Cath remains in place, unchanged. Heart is normal size. Chronic left suprahilar/apical scarring, stable. Continued left apical airspace opacity, similar to prior study. Right lung is clear. No acute confluent opacities or effusions. No acute bony abnormality. IMPRESSION: Stable chronic changes. Continued left apical airspace opacity which could reflect pneumonia or recurrent malignancy. Findings unchanged since prior study. Electronically Signed   By: Rolm Baptise M.D.   On: 01/05/2021 18:32   DG Chest 2 View  Result Date: 01/05/2021 CLINICAL DATA:  Patient complains of persistent cough, wheezing, congestion, weakness and shortness of breaths for 1.5 months. Patient reports recent bronchitis and malignant neoplasm of left upper lobe of the lung. History of asthma, porta cath and chronic obstructive pulmonary disease. Former smoker. EXAM: CHEST - 2 VIEW COMPARISON:  CT chest-abdomen-pelvis 11/17/2020; X-ray chest 02/18/2015. FINDINGS: Right-sided chest port remains in place with distal tip terminating at the level of the distal SVC. Normal heart size. Atherosclerotic calcification of the aortic knob. Chronic left suprahilar/apical scarring. New left apical opacity compared to the previous CT. Right lung is clear. No pleural effusion or pneumothorax. IMPRESSION: New left apical airspace opacity, which may represent pneumonia. Recurrent/progressive malignancy not excluded given proximity to the post treatment changes within the left suprahilar/apical region. Radiographic follow-up is recommended to ensure resolution. Electronically Signed   By: Davina Poke D.O.   On: 01/05/2021 15:29   CT Angio Chest PE W and/or Wo Contrast  Result Date:  01/06/2021 CLINICAL DATA:  75 year old female with progressive worsening shortness of breath over the past 3 weeks. Suspected pulmonary embolism. EXAM: CT ANGIOGRAPHY CHEST WITH CONTRAST TECHNIQUE: Multidetector CT imaging of the chest was performed using the standard protocol during bolus administration of intravenous contrast. Multiplanar CT image reconstructions and MIPs were obtained to evaluate the vascular anatomy. CONTRAST:  19mL OMNIPAQUE IOHEXOL 350 MG/ML SOLN COMPARISON:  CT the chest, abdomen and pelvis 11/17/2020. FINDINGS: Cardiovascular: No filling defects within the pulmonary arterial tree to suggest pulmonary embolism. Heart size is normal. There is no significant pericardial fluid, thickening or pericardial calcification. There is aortic atherosclerosis, as well as atherosclerosis of the great vessels of the mediastinum and the coronary arteries, including calcified atherosclerotic plaque in  the left anterior descending coronary artery. Mediastinum/Nodes: No pathologically enlarged mediastinal or hilar lymph nodes. Esophagus is unremarkable in appearance. No axillary lymphadenopathy. Lungs/Pleura: Chronic mass-like architectural distortion in the perihilar aspect of the left lung involving portions of the left upper lobe and superior segment of the left lower lobe. New compared to the recent prior examination is a combination of atelectasis and airspace consolidation in the left upper lobe which is now essentially completely non aerated. There is compensatory hyperexpansion of the left lower lobe. 5 mm ground-glass attenuation nodule in the medial aspect of the right lower lobe (axial image 65 of series 8), unchanged. Clustered peripheral micro nodularity in the right lower lobe (axial image 53 of series 8), also unchanged, likely chronic mucoid impaction within terminal bronchioles. No other definite new suspicious appearing pulmonary nodules or masses are noted. No acute consolidative airspace  disease. No pleural effusions. Mild diffuse bronchial wall thickening with mild centrilobular and paraseptal emphysema. Upper Abdomen: Aortic atherosclerosis. Musculoskeletal: There are no aggressive appearing lytic or blastic lesions noted in the visualized portions of the skeleton. Review of the MIP images confirms the above findings. IMPRESSION: 1. No evidence of pulmonary embolism. 2. Interval development of a combination of atelectasis and airspace consolidation (i.e., postobstructive pneumonia) in the left upper lobe. No centrally obstructing lesion identified. 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 3. Previously noted small right-sided pulmonary nodules are stable, likely benign. Continued attention on follow-up studies is recommended. 4. Aortic atherosclerosis, in addition to left anterior descending coronary artery disease. Assessment for potential risk factor modification, dietary therapy or pharmacologic therapy may be warranted, if clinically indicated. Aortic Atherosclerosis (ICD10-I70.0). Electronically Signed   By: Vinnie Langton M.D.   On: 01/06/2021 06:09    Assessment and plan- Patient is a 75 y.o. female with history of stage III adenocarcinoma of the lung s/p concurrent chemoradiation and maintenance durvalumab ending in February 2022 admitted for acute hypoxic respiratory failure likely secondary to community-acquired pneumonia  Patient failed outpatient Levaquin and was admitted for worsening hypoxic respiratory failure.  CT angio chest does not reveal any evidence of PE.  No clear signs of lung cancer recurrence either.  Mixed plexuses and consolidation noted in the left upper lobe and possibly features of postobstructive pneumonia secondary to radiation fibrosis.  Patient reports feeling symptomatically better since admission.  Recommend continuing IV antibiotics for the next 1 to 2 days and if she is overall better she could be potentially  discharged on oral antibiotics.  If she has recurrent symptoms I will consider getting a PET CT scan followed by consideration for bronc.  However I would like to avoid PET CT scan in the presence of acute infection.  I will discuss her case with Dr. [name] upon discharge    Visit Diagnosis 1. Acute respiratory failure with hypoxia ([place])   2. Postobstructive pneumonia     Dr. Randa Evens, MD, MPH [facility] [phone] 01/06/2021

## 2021-01-06 NOTE — H&P (Addendum)
History and Physical    [name] OEV:[id number] DOB: 1945/06/23 DOA: 01/06/2021  PCP: [name], MD   Patient coming from: Home  I have personally briefly reviewed patient's old medical records in [place]  Chief Complaint: Shortness of breath  HPI: Jennifer Hodges is a 75 y.o. female with medical history significant for history of stage III adenocarcinoma of the lung status post chemoradiation as well as completion of maintenance durvalumab in February 2022, history of GERD, diverticulosis, sleep apnea and vertigo who presents to the emergency room for evaluation of worsening shortness of breath. Patient states that symptoms have been going on for the past 3 weeks and have been getting worse despite completion of 2 courses of antibiotic therapy as an outpatient.  Shortness of breath is usually with exertion and patient is finding it increasingly difficult to walk short distances without stopping to catch her breath.  She has been sleeping in her recliner because she is unable to lay flat due to her symptoms.  She has a cough productive of yellowish-green sputum and complains of chest pain which she said is chronic and she has had this since after her radiation therapy.  She initially had fever and chills when her symptoms started but states that that has resolved.  She denies having any sick contacts.  She complains of constipation. She denies having any nausea, no vomiting, no dizziness, no lightheadedness, no headache, no abdominal pain, no changes in her bowel habits, no urinary symptoms, no leg swelling, no headache, no palpitations, no diaphoresis, no claudication, no anorexia or any focal deficit. Labs show sodium 139, potassium 3.9, chloride 103, bicarb 28, glucose 130, BUN 7, creatinine 0.37, calcium 9.0, alkaline phosphatase 76, albumin 4.1, AST 19, ALT 15, total protein 7.2, troponin 4, white count 7.4, hemoglobin 13.7, hematocrit 39, MCV 93, RDW 12.5, platelet count  246 Respiratory viral panel is negative Chest x-ray reviewed by me shows new left apical airspace opacity, which may represent pneumonia.  Recurrent/progressive malignancy not excluded given proximity to posttreatment changes within the left suprahilar and apical region. CT angiogram of the chest shows No evidence of pulmonary embolism. Interval development of a combination of atelectasis and airspace consolidation (i.e., postobstructive pneumonia) in the left upper lobe. No centrally obstructing lesion identified. 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 Previously noted small right-sided pulmonary nodules are stable, likely benign. Continued attention on follow-up studies is recommended. Aortic atherosclerosis, in addition to left anterior descending coronary artery disease.   ED Course: Patient is a 75 year old female with a history of adenocarcinoma of the lung status post chemo and radiation therapy as well as immunotherapy who presents to the ER for evaluation of a 3-week history of worsening shortness of breath mostly with exertion associated with a cough productive of yellowish-green phlegm.  Patient has completed 2 courses of antibiotic therapy as an outpatient without improvement in her symptoms. Imaging shows postobstructive pneumonia Patient will be admitted to the hospital for further evaluation.   Review of Systems: As per HPI otherwise all other systems reviewed and negative.    Past Medical History:  Diagnosis Date   Anemia    Asthma    No Inhalers--Dr. [name] will order as needed   Bronchiectasis ([place])    mild   Chronic headaches     followed by [facility] migraines   COPD (chronic obstructive pulmonary disease) (HCC)    DDD (degenerative disc disease), lumbar    Diverticulosis  Family history of adverse reaction to anesthesia    PONV   Gall stones    history of   GERD (gastroesophageal reflux disease)    EGD 8/09- non  bleeding erosive gastritis, documentd esophageal ulcerations.    Hiatal hernia    History of pneumonia    Hypercholesterolemia    IBS (irritable bowel syndrome)    Malignant neoplasm of upper lobe of left lung ([place]) 12/27/2018   Meniere disease    Murmur    Osteoarthritis    lumbar disc disease, left hip   Personal history of chemotherapy    Personal history of radiation therapy    PONV (postoperative nausea and vomiting)    "Only with last hip and I believe it was due to the morphine"    Sleep apnea    cpap asked to bring mask and tubing   Vertigo    Weakness of right side of body     Past Surgical History:  Procedure Laterality Date   ABDOMINAL HYSTERECTOMY  age 18   ANTERIOR CERVICAL DECOMP/DISCECTOMY FUSION N/A 02/24/2015   Procedure: CERVICAL FOUR-FIVE, CERVICAL FIVE-SIX, CERVICAL SIX-SEVEN ANTERIOR CERVICAL DECOMPRESSION/DISCECTOMY FUSION ;  Surgeon: [name], MD;  Location: [facility];  Service: Neurosurgery;  Laterality: N/A;  C45 C56 C67 anterior cervical decompression with fusion interbody prosthesis plating and bonegraft   APPENDECTOMY     BACK SURGERY     4th lumbar fusion   BLADDER SURGERY N/A    with vaginal wall repair   BREAST CYST ASPIRATION Bilateral    neg   BREAST SURGERY Bilateral    cyst removed and reduction   CARDIAC CATHETERIZATION  2014   CARPAL TUNNEL RELEASE Right 02/11/2016   Procedure: CARPAL TUNNEL RELEASE;  Surgeon: [name], MD;  Location: [facility];  Service: Orthopedics;  Laterality: Right;   CATARACT EXTRACTION W/ INTRAOCULAR LENS IMPLANT Bilateral 2015   CHOLECYSTECTOMY     EUS N/A 05/31/2012   Procedure: UPPER ENDOSCOPIC ULTRASOUND (EUS) LINEAR;  Surgeon: [name], MD;  Location: [facility];  Service: Endoscopy;  Laterality: N/A;   EXCISIONAL HEMORRHOIDECTOMY     JOINT REPLACEMENT Bilateral    KNEE ARTHROSCOPY WITH LATERAL MENISECTOMY Right 07/07/2015   Procedure: KNEE ARTHROSCOPY WITH LATERAL MENISECTOMY, PARTIAL  SYNOVECTOMY;  Surgeon: [name], MD;  Location: [facility];  Service: Orthopedics;  Laterality: Right;   LUMBAR LAMINECTOMY     PORTA CATH INSERTION N/A 01/07/2019   Procedure: PORTA CATH INSERTION;  Surgeon: [name], MD;  Location: [facility];  Service: Cardiovascular;  Laterality: N/A;   REDUCTION MAMMAPLASTY  1990   RIGHT OOPHORECTOMY     TOTAL HIP ARTHROPLASTY Left 05/01/2014   Dr. [name]   TOTAL HIP ARTHROPLASTY Right 08/05/2014   Procedure: TOTAL HIP ARTHROPLASTY ANTERIOR APPROACH;  Surgeon: [name], MD;  Location: [facility];  Service: Orthopedics;  Laterality: Right;   ULNAR NERVE TRANSPOSITION Right 02/11/2016   Procedure: ULNAR NERVE DECOMPRESSION/TRANSPOSITION;  Surgeon: [name], MD;  Location: [facility];  Service: Orthopedics;  Laterality: Right;   VIDEO BRONCHOSCOPY WITH ENDOBRONCHIAL ULTRASOUND Left 12/19/2018   Procedure: VIDEO BRONCHOSCOPY WITH ENDOBRONCHIAL ULTRASOUND, LEFT, SLEEP APNEA;  Surgeon: [name], MD;  Location: [facility];  Service: Thoracic;  Laterality: Left;     reports that she quit smoking about 13 years ago. Her smoking use included cigarettes. She has never used smokeless tobacco. She reports that she does not drink alcohol and does not use drugs.  Allergies  Allergen Reactions  Aspirin Hives and Other (See Comments)    Difficulty breathing   Celebrex [Celecoxib] Shortness Of Breath   Morphine And Related Nausea And Vomiting and Swelling   Adhesive [Tape] Other (See Comments)    Took top layer of skin off when removed.   Clarithromycin Nausea And Vomiting   Codeine Nausea And Vomiting   Darvon [Propoxyphene Hcl] Nausea And Vomiting   Demerol [Meperidine] Nausea And Vomiting   Flonase [Fluticasone Propionate] Other (See Comments)    Fungal infection   Simvastatin Other (See Comments)    "caused ulcers in mouth, and fever"   Talwin [Pentazocine] Nausea And Vomiting    Family History  Problem Relation Age of  Onset   Heart disease Mother        s/p stent   Hypertension Mother    Hypercholesterolemia Mother    Diabetes Father    Stomach cancer Other        uncle   Breast cancer Cousin    Breast cancer Paternal Aunt       Prior to Admission medications   Medication Sig Start Date End Date Taking? Authorizing Provider  albuterol (PROVENTIL) (2.5 MG/3ML) 0.083% nebulizer solution Take 2.5 mg by nebulization every 6 (six) hours as needed for wheezing.   Yes [provider]  albuterol (VENTOLIN HFA) 108 (90 Base) MCG/ACT inhaler Inhale 2 puffs into the lungs every 6 (six) hours as needed for wheezing or shortness of breath. 10/18/18  Yes Amyot, Nicholes Stairs, NP  benzonatate (TESSALON) 200 MG capsule Take by mouth. 01/01/21 01/08/21 Yes [provider]  Bioflavonoid Products (ESTER C PO) Take 1 tablet by mouth 3 (three) times daily.   Yes [provider]  Calcium-Magnesium-Zinc (CAL-MAG-ZINC PO) Take 1 tablet by mouth daily.   Yes [provider]  cetirizine (ZYRTEC) 5 MG tablet Take 1 tablet (5 mg total) by mouth daily. 04/17/18  Yes Cook, Jayce G, DO  CHLORASEPTIC MAX SORE THROAT 1.5-33 % LIQD Use as directed 1 spray in the mouth or throat as needed (throat irritation.).    Yes [provider]  Cholecalciferol (EQL VITAMIN D3) 25 MCG (1000 UT) capsule Take 4,000 Units by mouth daily.   Yes [provider]  Clotrimazole 1 % OINT Apply 1 application topically in the morning, at noon, and at bedtime. 12/19/19  Yes Sindy Guadeloupe, MD  Coenzyme Q10 (CO Q 10) 100 MG CAPS Take 100 mg by mouth daily.    Yes [provider]  Cyanocobalamin (VITAMIN B-12) 2500 MCG SUBL Place 2,500 mcg under the tongue daily.   Yes [provider]  cyclobenzaprine (FLEXERIL) 10 MG tablet Take 10 mg by mouth at bedtime.   Yes [provider]  DHEA 25 MG CAPS Take 25 mg by mouth daily.   Yes [provider]  ezetimibe (ZETIA) 10 MG tablet Take 5  mg by mouth in the morning and at bedtime.  10/26/18  Yes [provider]  folic acid (FOLVITE) 932 MCG tablet Take 400 mcg by mouth 2 (two) times daily.    Yes [provider]  Garlic 6712 MG CAPS Take 1,000 mg by mouth daily.   Yes [provider]  guaiFENesin-dextromethorphan (ROBITUSSIN DM) 100-10 MG/5ML syrup Take 5 mLs by mouth every 4 (four) hours as needed for cough.   Yes [provider]  Histamine Dihydrochloride (AUSTRALIAN DREAM ARTHRITIS EX) Apply 1 application topically 4 (four) times daily as needed (pain.).   Yes [provider]  ipratropium (  ATROVENT) 0.03 % nasal spray SMARTSIG:1-2 Spray(s) Both Nares 3 Times Daily PRN 04/27/20  Yes [provider]  lidocaine-prilocaine (EMLA) cream lidocaine-prilocaine 2.5 %-2.5 % topical cream   Yes [provider]  meclizine (ANTIVERT) 25 MG tablet Take 1 tablet (25 mg total) by mouth every 6 (six) hours as needed for dizziness. 06/11/19  Yes Sindy Guadeloupe, MD  montelukast (SINGULAIR) 10 MG tablet Take 10 mg by mouth at bedtime.  11/22/18  Yes [provider]  ondansetron (ZOFRAN-ODT) 4 MG disintegrating tablet Take 4 mg by mouth every 8 (eight) hours as needed for nausea/vomiting. 08/27/18  Yes [provider]  Polyethyl Glycol-Propyl Glycol (SYSTANE) 0.4-0.3 % SOLN Place 1 drop into both eyes 5 (five) times daily as needed (dry/irritated eyes).    Yes [provider]  pyridOXINE (VITAMIN B-6) 100 MG tablet Take 100 mg by mouth daily.   Yes [provider]  Selenium 200 MCG CAPS Take 200 mcg by mouth daily.   Yes [provider]  sucralfate (CARAFATE) 1 g tablet 1 tablet 3 (three) times daily as needed.  02/13/19  Yes [provider]  triamcinolone (KENALOG) 0.1 % Apply 1 application topically 2 (two) times daily. 03/24/20  Yes Margarette Canada, NP  UBRELVY 100 MG TABS Take by mouth. 01/30/20  Yes [provider]  Vitamin A 2250  MCG (7500 UT) CAPS Take 1 capsule by mouth daily.    Yes [provider]  vitamin E 400 UNIT capsule Take 400 Units by mouth daily.   Yes [provider]  Zoledronic Acid (RECLAST IV) Inject 1 Dose into the vein as directed. ONCE A YEAR   Yes [provider]  azithromycin (ZITHROMAX) 250 MG tablet Take 2 tablets on day 1 (500 mg) and 1 tablet (250 mg) until complete. Patient not taking: No sig reported 11/27/20   Jacquelin Hawking, NP  Ensure (ENSURE) Take 237 mLs by mouth 2 (two) times daily between meals.    [provider]  HYDROcodone-acetaminophen (NORCO/VICODIN) 5-325 MG tablet 1-2 tabs po bid prn Patient taking differently: Take 1 tablet by mouth at bedtime. 12/09/18   Norval Gable, MD  prochlorperazine (COMPAZINE) 10 MG tablet TAKE 1 TABLET(10 MG) BY MOUTH EVERY 6 HOURS AS NEEDED FOR NAUSEA OR VOMITING 01/06/19 03/22/19  Sindy Guadeloupe, MD    Physical Exam: Vitals:   01/05/21 2332 01/06/21 0339 01/06/21 0615 01/06/21 0636  BP: (!) 157/70 (!) 144/71 140/72   Pulse: 90 86 99   Resp: 18 18 20    Temp: 99.2 F (37.3 C)     TempSrc: Oral     SpO2: 95% 95% 97%   Weight:    76 kg     Vitals:   01/05/21 2332 01/06/21 0339 01/06/21 0615 01/06/21 0636  BP: (!) 157/70 (!) 144/71 140/72   Pulse: 90 86 99   Resp: 18 18 20    Temp: 99.2 F (37.3 C)     TempSrc: Oral     SpO2: 95% 95% 97%   Weight:    76 kg      Constitutional: Alert and oriented x 3 . Not in any apparent distress HEENT:      Head: Normocephalic and atraumatic.         Eyes: PERLA, EOMI, Conjunctivae are normal. Sclera is non-icteric.       Mouth/Throat: Mucous membranes are moist.       Neck: Supple with no signs of meningismus. Cardiovascular: Regular rate and rhythm.  No murmurs, gallops, or rubs. 2+ symmetrical distal pulses are present . No JVD. No LE edema Respiratory: Respiratory effort normal.  Rhonchi over left lung fields.right lung is clear to auscultation.  No wheezes  or crackles.   Gastrointestinal: Soft, non tender, and non distended with positive bowel sounds.  Genitourinary: No CVA tenderness. Musculoskeletal: Nontender with normal range of motion in all extremities. No cyanosis, or erythema of extremities. Neurologic:  Face is symmetric. Moving all extremities. No gross focal neurologic deficits . Skin: Skin is warm, dry.  No rash or ulcers Psychiatric: Mood and affect are normal    Labs on Admission: I have personally reviewed following labs and imaging studies  CBC: Recent Labs  Lab 01/05/21 1830  WBC 7.4  NEUTROABS 4.7  HGB 13.7  HCT 39.8  MCV 93.0  PLT 119   Basic Metabolic Panel: Recent Labs  Lab 01/05/21 1830  NA 139  K 3.9  CL 103  CO2 28  GLUCOSE 130*  BUN 7*  CREATININE 0.37*  CALCIUM 9.0   GFR: CrCl cannot be calculated (Unknown ideal weight.). Liver Function Tests: Recent Labs  Lab 01/05/21 1830  AST 19  ALT 15  ALKPHOS 76  BILITOT 0.8  PROT 7.2  ALBUMIN 4.1   No results for input(s): LIPASE, AMYLASE in the last 168 hours. No results for input(s): AMMONIA in the last 168 hours. Coagulation Profile: No results for input(s): INR, PROTIME in the last 168 hours. Cardiac Enzymes: No results for input(s): CKTOTAL, CKMB, CKMBINDEX, TROPONINI in the last 168 hours. BNP (last 3 results) No results for input(s): PROBNP in the last 8760 hours. HbA1C: No results for input(s): HGBA1C in the last 72 hours. CBG: No results for input(s): GLUCAP in the last 168 hours. Lipid Profile: No results for input(s): CHOL, HDL, LDLCALC, TRIG, CHOLHDL, LDLDIRECT in the last 72 hours. Thyroid Function Tests: No results for input(s): TSH, T4TOTAL, FREET4, T3FREE, THYROIDAB in the last 72 hours. Anemia Panel: No results for input(s): VITAMINB12, FOLATE, FERRITIN, TIBC, IRON, RETICCTPCT in the last 72 hours. Urine analysis:    Component Value Date/Time   COLORURINE YELLOW (A) 01/30/2019 1315   APPEARANCEUR Clear 06/22/2020  1012   LABSPEC 1.012 01/30/2019 1315   PHURINE 6.0 01/30/2019 1315   GLUCOSEU Negative 06/22/2020 1012   GLUCOSEU NEGATIVE 06/19/2014 1002   HGBUR NEGATIVE 01/30/2019 1315   BILIRUBINUR Negative 06/22/2020 [place] 01/30/2019 1315   PROTEINUR Negative 06/22/2020 1012   PROTEINUR NEGATIVE 01/30/2019 1315   UROBILINOGEN 1.0 03/04/2015 1339   UROBILINOGEN 0.2 06/19/2014 1002   NITRITE Negative 06/22/2020 1012   NITRITE NEGATIVE 01/30/2019 1315   LEUKOCYTESUR Negative 06/22/2020 1012   LEUKOCYTESUR NEGATIVE 01/30/2019 1315    Radiological Exams on Admission: DG Chest 2 View  Result Date: 01/05/2021 CLINICAL DATA:  Shortness of breath EXAM: CHEST - 2 VIEW COMPARISON:  01/04/2021 FINDINGS: Right Port-A-Cath remains in place, unchanged. Heart is normal size. Chronic left suprahilar/apical scarring, stable. Continued left apical airspace opacity, similar to prior study. Right lung is clear. No acute confluent opacities or effusions. No acute bony abnormality. IMPRESSION: Stable chronic changes. Continued left apical airspace opacity which could reflect pneumonia or recurrent malignancy. Findings unchanged since prior study. Electronically Signed   By: Rolm Baptise M.D.   On: 01/05/2021 18:32   DG Chest 2 View  Result Date: 01/05/2021 CLINICAL DATA:  Patient complains of persistent cough, wheezing, congestion, weakness and shortness of breaths for 1.5 months. Patient reports recent bronchitis  and malignant neoplasm of left upper lobe of the lung. History of asthma, porta cath and chronic obstructive pulmonary disease. Former smoker. EXAM: CHEST - 2 VIEW COMPARISON:  CT chest-abdomen-pelvis 11/17/2020; X-ray chest 02/18/2015. FINDINGS: Right-sided chest port remains in place with distal tip terminating at the level of the distal SVC. Normal heart size. Atherosclerotic calcification of the aortic knob. Chronic left suprahilar/apical scarring. New left apical opacity compared to the  previous CT. Right lung is clear. No pleural effusion or pneumothorax. IMPRESSION: New left apical airspace opacity, which may represent pneumonia. Recurrent/progressive malignancy not excluded given proximity to the post treatment changes within the left suprahilar/apical region. Radiographic follow-up is recommended to ensure resolution. Electronically Signed   By: Davina Poke D.O.   On: 01/05/2021 15:29   CT Angio Chest PE W and/or Wo Contrast  Result Date: 01/06/2021 CLINICAL DATA:  75 year old female with progressive worsening shortness of breath over the past 3 weeks. Suspected pulmonary embolism. EXAM: CT ANGIOGRAPHY CHEST WITH CONTRAST TECHNIQUE: Multidetector CT imaging of the chest was performed using the standard protocol during bolus administration of intravenous contrast. Multiplanar CT image reconstructions and MIPs were obtained to evaluate the vascular anatomy. CONTRAST:  45mL OMNIPAQUE IOHEXOL 350 MG/ML SOLN COMPARISON:  CT the chest, abdomen and pelvis 11/17/2020. FINDINGS: Cardiovascular: No filling defects within the pulmonary arterial tree to suggest pulmonary embolism. Heart size is normal. There is no significant pericardial fluid, thickening or pericardial calcification. There is aortic atherosclerosis, as well as atherosclerosis of the great vessels of the mediastinum and the coronary arteries, including calcified atherosclerotic plaque in the left anterior descending coronary artery. Mediastinum/Nodes: No pathologically enlarged mediastinal or hilar lymph nodes. Esophagus is unremarkable in appearance. No axillary lymphadenopathy. Lungs/Pleura: Chronic mass-like architectural distortion in the perihilar aspect of the left lung involving portions of the left upper lobe and superior segment of the left lower lobe. New compared to the recent prior examination is a combination of atelectasis and airspace consolidation in the left upper lobe which is now essentially completely non  aerated. There is compensatory hyperexpansion of the left lower lobe. 5 mm ground-glass attenuation nodule in the medial aspect of the right lower lobe (axial image 65 of series 8), unchanged. Clustered peripheral micro nodularity in the right lower lobe (axial image 53 of series 8), also unchanged, likely chronic mucoid impaction within terminal bronchioles. No other definite new suspicious appearing pulmonary nodules or masses are noted. No acute consolidative airspace disease. No pleural effusions. Mild diffuse bronchial wall thickening with mild centrilobular and paraseptal emphysema. Upper Abdomen: Aortic atherosclerosis. Musculoskeletal: There are no aggressive appearing lytic or blastic lesions noted in the visualized portions of the skeleton. Review of the MIP images confirms the above findings. IMPRESSION: 1. No evidence of pulmonary embolism. 2. Interval development of a combination of atelectasis and airspace consolidation (i.e., postobstructive pneumonia) in the left upper lobe. No centrally obstructing lesion identified. 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 3. Previously noted small right-sided pulmonary nodules are stable, likely benign. Continued attention on follow-up studies is recommended. 4. Aortic atherosclerosis, in addition to left anterior descending coronary artery disease. Assessment for potential risk factor modification, dietary therapy or pharmacologic therapy may be warranted, if clinically indicated. Aortic Atherosclerosis (ICD10-I70.0). Electronically Signed   By: Vinnie Langton M.D.   On: 01/06/2021 06:09     Assessment/Plan Principal Problem:   Postobstructive pneumonia Active Problems:   Sleep apnea   GERD (gastroesophageal reflux disease)   Malignant neoplasm of upper  lobe of left lung [facility])   Benign essential hypertension      Patient is a 75 year old female with a history of adenocarcinoma of the left lung admitted to the  hospital for postobstructive pneumonia.    Postobstructive pneumonia Patient has a history of adenocarcinoma of the left lung and is status post chemoradiation therapy as well as immunotherapy She presents for evaluation of a 3-week history of worsening shortness of breath associated with a cough productive of greenish phlegm and imaging shows airspace consolidation in the left upper suggestive of possible postobstructive pneumonia presumably secondary to chronic postradiation fibrosis We will treat patient empirically with Rocephin and Zithromax We will consult pulmonology    Sleep apnea Continue CPAP at bedtime     History of lung cancer Patient has a history of adenocarcinoma of the left lung and is status post chemo and radiation therapy as well as immunotherapy. Follow-up with oncology upon discharge   DVT prophylaxis: Lovenox  Code Status: DO NOT RESUSCITATE Family Communication: Greater than 50% of time was spent discussing patient's condition and plan of care with her at the bedside.  All questions and concerns have been addressed.  She verbalizes understanding and agrees with the plan.  CODE STATUS was discussed and she wishes to be a DO NOT RESUSCITATE.  She lists her daughter as her healthcare power of attorney. Disposition Plan: Back to previous home environment Consults called: Oncology/pulmonology Status:At the time of admission, it appears that the appropriate admission status for this patient is inpatient. This is judged to be reasonable and necessary to provide the required intensity of service to ensure the patient's safety given the presenting symptoms, physical exam findings, and initial radiographic and laboratory data in the context of their comorbid conditions. Patient requires inpatient status due to high intensity of service, high risk for further deterioration and high frequency of surveillance required.     Collier Bullock MD [facility]     01/06/2021, 9:48 AM

## 2021-01-06 NOTE — Progress Notes (Signed)
Lab notified of pending collection

## 2021-01-06 NOTE — Telephone Encounter (Signed)
Patient is being admitted to hospital for resp failure

## 2021-01-06 NOTE — Consult Note (Signed)
Pulmonary Medicine             CHIEF COMPLAINT:   Sob, persistenyt cough, refractory to out side therapy. Left pneumonia   HISTORY OF PRESENT ILLNESS   Full note to follow   PAST MEDICAL HISTORY   Past Medical History:  Diagnosis Date   Anemia    Asthma    No Inhalers--Dr. [name] will order as needed   Bronchiectasis ([place])    mild   Chronic headaches     followed by [facility] migraines   COPD (chronic obstructive pulmonary disease) (HCC)    DDD (degenerative disc disease), lumbar    Diverticulosis    Family history of adverse reaction to anesthesia    PONV   Gall stones    history of   GERD (gastroesophageal reflux disease)    EGD 8/09- non bleeding erosive gastritis, documentd esophageal ulcerations.    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   Meniere disease    Murmur    Osteoarthritis    lumbar disc disease, left hip   Personal history of chemotherapy    Personal history of radiation therapy    PONV (postoperative nausea and vomiting)    "Only with last hip and I believe it was due to the morphine"    Sleep apnea    cpap asked to bring mask and tubing   Vertigo    Weakness of right side of body      SURGICAL HISTORY   Past Surgical History:  Procedure Laterality Date   ABDOMINAL HYSTERECTOMY  age 82   ANTERIOR CERVICAL DECOMP/DISCECTOMY FUSION N/A 02/24/2015   Procedure: CERVICAL FOUR-FIVE, CERVICAL FIVE-SIX, CERVICAL SIX-SEVEN ANTERIOR CERVICAL DECOMPRESSION/DISCECTOMY FUSION ;  Surgeon: [name], MD;  Location: [facility];  Service: Neurosurgery;  Laterality: N/A;  C45 C56 C67 anterior cervical decompression with fusion interbody prosthesis plating and bonegraft   APPENDECTOMY     BACK SURGERY     4th lumbar fusion   BLADDER SURGERY N/A    with vaginal wall repair   BREAST CYST ASPIRATION  Bilateral    neg   BREAST SURGERY Bilateral    cyst removed and reduction   CARDIAC CATHETERIZATION  2014   CARPAL TUNNEL RELEASE Right 02/11/2016   Procedure: CARPAL TUNNEL RELEASE;  Surgeon: [name], MD;  Location: [facility];  Service: Orthopedics;  Laterality: Right;   CATARACT EXTRACTION W/ INTRAOCULAR LENS IMPLANT Bilateral 2015   CHOLECYSTECTOMY     EUS N/A 05/31/2012   Procedure: UPPER ENDOSCOPIC ULTRASOUND (EUS) LINEAR;  Surgeon: [name], MD;  Location: [facility];  Service: Endoscopy;  Laterality: N/A;   EXCISIONAL HEMORRHOIDECTOMY     JOINT REPLACEMENT Bilateral    KNEE ARTHROSCOPY WITH LATERAL MENISECTOMY Right 07/07/2015   Procedure: KNEE ARTHROSCOPY WITH LATERAL MENISECTOMY, PARTIAL SYNOVECTOMY;  Surgeon: [name], MD;  Location: [facility];  Service: Orthopedics;  Laterality: Right;   LUMBAR LAMINECTOMY     PORTA CATH INSERTION N/A 01/07/2019   Procedure: PORTA CATH INSERTION;  Surgeon: [name], MD;  Location: [facility];  Service: Cardiovascular;  Laterality: N/A;   REDUCTION MAMMAPLASTY  1990   RIGHT OOPHORECTOMY     TOTAL HIP ARTHROPLASTY Left 05/01/2014   Dr. [name]   TOTAL HIP ARTHROPLASTY Right 08/05/2014   Procedure: TOTAL HIP ARTHROPLASTY ANTERIOR  APPROACH;  Surgeon: [name], MD;  Location: [facility];  Service: Orthopedics;  Laterality: Right;   ULNAR NERVE TRANSPOSITION Right 02/11/2016   Procedure: ULNAR NERVE DECOMPRESSION/TRANSPOSITION;  Surgeon: [name], MD;  Location: [facility];  Service: Orthopedics;  Laterality: Right;   VIDEO BRONCHOSCOPY WITH ENDOBRONCHIAL ULTRASOUND Left 12/19/2018   Procedure: VIDEO BRONCHOSCOPY WITH ENDOBRONCHIAL ULTRASOUND, LEFT, SLEEP APNEA;  Surgeon: [name], MD;  Location: [facility];  Service: Thoracic;  Laterality: Left;     FAMILY HISTORY   Family History  Problem Relation Age of Onset   Heart disease Mother        s/p stent   Hypertension Mother    Hypercholesterolemia  Mother    Diabetes Father    Stomach cancer Other        uncle   Breast cancer Cousin    Breast cancer Paternal Aunt      SOCIAL HISTORY   Social History   Tobacco Use   Smoking status: Former    Types: Cigarettes    Quit date: 06/13/2007    Years since quitting: 13.5   Smokeless tobacco: Never  Vaping Use   Vaping Use: Never used  Substance Use Topics   Alcohol use: No    Alcohol/week: 0.0 standard drinks   Drug use: No     MEDICATIONS    Home Medication:  Current Outpatient Rx   Order #: [id number] Class: Historical Med   Order #: [id number] Class: Normal   Order #: [id number] Class: Historical Med   Order #: [id number] Class: Historical Med   Order #: [id number] Class: Historical Med   Order #: [id number] Class: Normal   Order #: [id number] Class: Historical Med   Order #: [id number] Class: Historical Med   Order #: [id number] Class: Normal   Order #: [id number] Class: Historical Med   Order #: [id number] Class: Historical Med   Order #: [id number] Class: Historical Med   Order #: [id number] Class: Historical Med   Order #: [id number] Class: Historical Med   Order #: [id number] Class: Historical Med   Order #: [id number] Class: Historical Med   Order #: [id number] Class: Historical Med   Order #: [id number] Class: Historical Med   Order #: [id number] Class: Historical Med   Order #: [id number] Class: Historical Med   Order #: [id number] Class: Normal   Order #: [id number] Class: Historical Med   Order #: [id number] Class: Historical Med   Order #: [id number] Class: Historical Med   Order #: [id number] Class: Historical Med   Order #: [id number] Class: Historical Med   Order #: [id number] Class: Historical Med   Order #: [id number] Class: Normal   Order #: [id number] Class: Historical Med   Order #: [id number] Class: Historical Med   Order #: [id number] Class: Historical Med   Order #: [id number] Class: Historical Med   Order #: [id number] Class: Normal   Order #: [id number] Class: Historical Med   Order #:  [id number] Class: Normal    Current Medication:  Current Facility-Administered Medications:    albuterol (PROVENTIL) (2.5 MG/3ML) 0.083% nebulizer solution 2.5 mg, 2.5 mg, Nebulization, Q6H PRN, Agbata, Tochukwu, MD   azithromycin (ZITHROMAX) 500 mg in sodium chloride 0.9 % 250 mL IVPB, 500 mg, Intravenous, Q24H, Agbata, Tochukwu, MD, Stopped at 01/06/21 1118   benzonatate (TESSALON) capsule 200 mg, 200 mg, Oral, TID, Agbata, Tochukwu, MD, 200 mg at 01/06/21 1531   cefTRIAXone (ROCEPHIN) 2 g in sodium chloride 0.9 % 100 mL IVPB, 2 g, Intravenous, Q24H, Agbata, Tochukwu, MD, Stopped at 01/06/21 1357   cholecalciferol (VITAMIN D) tablet 4,000 Units, 4,000 Units, Oral, Daily, Agbata, Tochukwu, MD, 4,000 Units at 01/06/21 1005   cyclobenzaprine (FLEXERIL) tablet 10  mg, 10 mg, Oral, QHS, Agbata, Tochukwu, MD   enoxaparin (LOVENOX) injection 40 mg, 40 mg, Subcutaneous, Q24H, Agbata, Tochukwu, MD   ezetimibe (ZETIA) tablet 5 mg, 5 mg, Oral, Daily, Agbata, Tochukwu, MD   folic acid (FOLVITE) tablet 1 mg, 1 mg, Oral, BID, Noralee Space, RPH, 1 mg at 01/06/21 1012   HYDROcodone-acetaminophen (NORCO/VICODIN) 5-325 MG per tablet 1 tablet, 1 tablet, Oral, QHS, Agbata, Tochukwu, MD   loratadine (CLARITIN) tablet 10 mg, 10 mg, Oral, Daily, Agbata, Tochukwu, MD, 10 mg at 01/06/21 1005   meclizine (ANTIVERT) tablet 25 mg, 25 mg, Oral, Q6H PRN, Agbata, Tochukwu, MD   montelukast (SINGULAIR) tablet 10 mg, 10 mg, Oral, QHS, Agbata, Tochukwu, MD   polyethylene glycol (MIRALAX / GLYCOLAX) packet 17 g, 17 g, Oral, Daily, Agbata, Tochukwu, MD, 17 g at 01/06/21 1006   [START ON 01/07/2021] pyridOXINE (VITAMIN B-6) tablet 100 mg, 100 mg, Oral, Daily, Agbata, Tochukwu, MD   Ubrogepant TABS, , Oral, Daily, Agbata, Tochukwu, MD   vitamin B-12 (CYANOCOBALAMIN) tablet 2,500 mcg, 2,500 mcg, Oral, Daily, Agbata, Tochukwu, MD  Current Outpatient Medications:    albuterol (PROVENTIL) (2.5 MG/3ML) 0.083% nebulizer solution,  Take 2.5 mg by nebulization every 6 (six) hours as needed for wheezing., Disp: , Rfl:    albuterol (VENTOLIN HFA) 108 (90 Base) MCG/ACT inhaler, Inhale 2 puffs into the lungs every 6 (six) hours as needed for wheezing or shortness of breath., Disp: 18 g, Rfl: 0   benzonatate (TESSALON) 200 MG capsule, Take by mouth., Disp: , Rfl:    Bioflavonoid Products (ESTER C PO), Take 1 tablet by mouth 3 (three) times daily., Disp: , Rfl:    Calcium-Magnesium-Zinc (CAL-MAG-ZINC PO), Take 1 tablet by mouth daily., Disp: , Rfl:    cetirizine (ZYRTEC) 5 MG tablet, Take 1 tablet (5 mg total) by mouth daily., Disp: 30 tablet, Rfl: 0   CHLORASEPTIC MAX SORE THROAT 1.5-33 % LIQD, Use as directed 1 spray in the mouth or throat as needed (throat irritation.). , Disp: , Rfl:    Cholecalciferol (EQL VITAMIN D3) 25 MCG (1000 UT) capsule, Take 4,000 Units by mouth daily., Disp: , Rfl:    Clotrimazole 1 % OINT, Apply 1 application topically in the morning, at noon, and at bedtime., Disp: 56.7 g, Rfl: 0   Coenzyme Q10 (CO Q 10) 100 MG CAPS, Take 100 mg by mouth daily. , Disp: , Rfl:    Cyanocobalamin (VITAMIN B-12) 2500 MCG SUBL, Place 2,500 mcg under the tongue daily., Disp: , Rfl:    cyclobenzaprine (FLEXERIL) 10 MG tablet, Take 10 mg by mouth at bedtime., Disp: , Rfl:    DHEA 25 MG CAPS, Take 25 mg by mouth daily., Disp: , Rfl:    ezetimibe (ZETIA) 10 MG tablet, Take 5 mg by mouth in the morning and at bedtime. , Disp: , Rfl:    folic acid (FOLVITE) 449 MCG tablet, Take 400 mcg by mouth 2 (two) times daily. , Disp: , Rfl:    Garlic 6759 MG CAPS, Take 1,000 mg by mouth daily., Disp: , Rfl:    guaiFENesin-dextromethorphan (ROBITUSSIN DM) 100-10 MG/5ML syrup, Take 5 mLs by mouth every 4 (four) hours as needed for cough., Disp: , Rfl:    Histamine Dihydrochloride (AUSTRALIAN DREAM ARTHRITIS EX), Apply 1 application topically 4 (four) times daily as needed (pain.)., Disp: , Rfl:    ipratropium (ATROVENT) 0.03 % nasal spray,  SMARTSIG:1-2 Spray(s) Both Nares 3 Times Daily PRN, Disp: , Rfl:  lidocaine-prilocaine (EMLA) cream, lidocaine-prilocaine 2.5 %-2.5 % topical cream, Disp: , Rfl:    meclizine (ANTIVERT) 25 MG tablet, Take 1 tablet (25 mg total) by mouth every 6 (six) hours as needed for dizziness., Disp: 30 tablet, Rfl: 0   montelukast (SINGULAIR) 10 MG tablet, Take 10 mg by mouth at bedtime. , Disp: , Rfl:    ondansetron (ZOFRAN-ODT) 4 MG disintegrating tablet, Take 4 mg by mouth every 8 (eight) hours as needed for nausea/vomiting., Disp: , Rfl:    Polyethyl Glycol-Propyl Glycol (SYSTANE) 0.4-0.3 % SOLN, Place 1 drop into both eyes 5 (five) times daily as needed (dry/irritated eyes). , Disp: , Rfl:    pyridOXINE (VITAMIN B-6) 100 MG tablet, Take 100 mg by mouth daily., Disp: , Rfl:    Selenium 200 MCG CAPS, Take 200 mcg by mouth daily., Disp: , Rfl:    sucralfate (CARAFATE) 1 g tablet, 1 tablet 3 (three) times daily as needed. , Disp: , Rfl:    triamcinolone (KENALOG) 0.1 %, Apply 1 application topically 2 (two) times daily., Disp: 30 g, Rfl: 0   UBRELVY 100 MG TABS, Take by mouth., Disp: , Rfl:    Vitamin A 2250 MCG (7500 UT) CAPS, Take 1 capsule by mouth daily. , Disp: , Rfl:    vitamin E 400 UNIT capsule, Take 400 Units by mouth daily., Disp: , Rfl:    Zoledronic Acid (RECLAST IV), Inject 1 Dose into the vein as directed. ONCE A YEAR, Disp: , Rfl:    azithromycin (ZITHROMAX) 250 MG tablet, Take 2 tablets on day 1 (500 mg) and 1 tablet (250 mg) until complete. (Patient not taking: No sig reported), Disp: 6 each, Rfl: 0   Ensure (ENSURE), Take 237 mLs by mouth 2 (two) times daily between meals., Disp: , Rfl:    HYDROcodone-acetaminophen (NORCO/VICODIN) 5-325 MG tablet, 1-2 tabs po bid prn (Patient taking differently: Take 1 tablet by mouth at bedtime.), Disp: 10 tablet, Rfl: 0    ALLERGIES   Aspirin, Celebrex [celecoxib], Morphine and related, Adhesive [tape], Clarithromycin, Codeine, Darvon [propoxyphene  hcl], Demerol [meperidine], Flonase [fluticasone propionate], Simvastatin, and Talwin [pentazocine]     REVIEW OF SYSTEMS    Review of Systems:  Gen:  Denies  fever, sweats, chills weigh loss  HEENT: Denies blurred vision, double vision, ear pain, eye pain, hearing loss, nose bleeds, sore throat Cardiac:  No dizziness, chest pain or heaviness, chest tightness,edema Resp:   ++ cough or sputum porduction, + shortness of breath,+ wheezing, -hemoptysis,  Gi: Denies swallowing difficulty, stomach pain, nausea or vomiting, diarrhea, constipation, bowel incontinence Gu:  Denies bladder incontinence, burning urine Ext:   Denies Joint pain, stiffness or swelling Skin: Denies  skin rash, easy bruising or bleeding or hives Endoc:  Denies polyuria, polydipsia , polyphagia or weight change Psych:   Denies depression, insomnia or hallucinations   Other:  All other systems negative   VS: BP 120/77   Pulse 93   Temp 99.2 F (37.3 C) (Oral)   Resp 16   Wt 76 kg   SpO2 91%   BMI 31.66 kg/m      PHYSICAL EXAM    GENERAL:NAD, no fevers, chills, no weakness no fatigue HEAD: Normocephalic, atraumatic.  EYES: Pupils equal, round, reactive to light. Extraocular muscles intact. No scleral icterus.  MOUTH: Moist mucosal membrane. Dentition intact. No abscess noted.  EAR, NOSE, THROAT: Clear without exudates. No external lesions.  NECK: Supple. No thyromegaly. No nodules. No JVD.  PULMONARY: Diffuse coarse rhonchi  wheezes, no rub CARDIOVASCULAR: S1 and S2. Regular rate and rhythm. No murmurs, rubs, or gallops. No edema. Pedal pulses 2+ bilaterally.  GASTROINTESTINAL: Soft, nontender, nondistended. No masses. Positive bowel sounds. No hepatosplenomegaly.  MUSCULOSKELETAL: No swelling, clubbing, or edema. Range of motion full in all extremities.  NEUROLOGIC: Cranial nerves II through XII are intact. No gross focal neurological deficits. Sensation intact. Reflexes intact.  SKIN: No ulceration,  lesions, rashes, or cyanosis. Skin warm and dry. Turgor intact.  PSYCHIATRIC: Mood, affect within normal limits. The patient is awake, alert and oriented x 3. Insight, judgment intact.       IMAGING    DG Chest 2 View  Result Date: 01/05/2021 CLINICAL DATA:  Shortness of breath EXAM: CHEST - 2 VIEW COMPARISON:  01/04/2021 FINDINGS: Right Port-A-Cath remains in place, unchanged. Heart is normal size. Chronic left suprahilar/apical scarring, stable. Continued left apical airspace opacity, similar to prior study. Right lung is clear. No acute confluent opacities or effusions. No acute bony abnormality. IMPRESSION: Stable chronic changes. Continued left apical airspace opacity which could reflect pneumonia or recurrent malignancy. Findings unchanged since prior study. Electronically Signed   By: Rolm Baptise M.D.   On: 01/05/2021 18:32   DG Chest 2 View  Result Date: 01/05/2021 CLINICAL DATA:  Patient complains of persistent cough, wheezing, congestion, weakness and shortness of breaths for 1.5 months. Patient reports recent bronchitis and malignant neoplasm of left upper lobe of the lung. History of asthma, porta cath and chronic obstructive pulmonary disease. Former smoker. EXAM: CHEST - 2 VIEW COMPARISON:  CT chest-abdomen-pelvis 11/17/2020; X-ray chest 02/18/2015. FINDINGS: Right-sided chest port remains in place with distal tip terminating at the level of the distal SVC. Normal heart size. Atherosclerotic calcification of the aortic knob. Chronic left suprahilar/apical scarring. New left apical opacity compared to the previous CT. Right lung is clear. No pleural effusion or pneumothorax. IMPRESSION: New left apical airspace opacity, which may represent pneumonia. Recurrent/progressive malignancy not excluded given proximity to the post treatment changes within the left suprahilar/apical region. Radiographic follow-up is recommended to ensure resolution. Electronically Signed   By: Davina Poke  D.O.   On: 01/05/2021 15:29   CT Angio Chest PE W and/or Wo Contrast  Result Date: 01/06/2021 CLINICAL DATA:  75 year old female with progressive worsening shortness of breath over the past 3 weeks. Suspected pulmonary embolism. EXAM: CT ANGIOGRAPHY CHEST WITH CONTRAST TECHNIQUE: Multidetector CT imaging of the chest was performed using the standard protocol during bolus administration of intravenous contrast. Multiplanar CT image reconstructions and MIPs were obtained to evaluate the vascular anatomy. CONTRAST:  69mL OMNIPAQUE IOHEXOL 350 MG/ML SOLN COMPARISON:  CT the chest, abdomen and pelvis 11/17/2020. FINDINGS: Cardiovascular: No filling defects within the pulmonary arterial tree to suggest pulmonary embolism. Heart size is normal. There is no significant pericardial fluid, thickening or pericardial calcification. There is aortic atherosclerosis, as well as atherosclerosis of the great vessels of the mediastinum and the coronary arteries, including calcified atherosclerotic plaque in the left anterior descending coronary artery. Mediastinum/Nodes: No pathologically enlarged mediastinal or hilar lymph nodes. Esophagus is unremarkable in appearance. No axillary lymphadenopathy. Lungs/Pleura: Chronic mass-like architectural distortion in the perihilar aspect of the left lung involving portions of the left upper lobe and superior segment of the left lower lobe. New compared to the recent prior examination is a combination of atelectasis and airspace consolidation in the left upper lobe which is now essentially completely non aerated. There is compensatory hyperexpansion of the left lower lobe. 5 mm ground-glass  attenuation nodule in the medial aspect of the right lower lobe (axial image 65 of series 8), unchanged. Clustered peripheral micro nodularity in the right lower lobe (axial image 53 of series 8), also unchanged, likely chronic mucoid impaction within terminal bronchioles. No other definite new  suspicious appearing pulmonary nodules or masses are noted. No acute consolidative airspace disease. No pleural effusions. Mild diffuse bronchial wall thickening with mild centrilobular and paraseptal emphysema. Upper Abdomen: Aortic atherosclerosis. Musculoskeletal: There are no aggressive appearing lytic or blastic lesions noted in the visualized portions of the skeleton. Review of the MIP images confirms the above findings. IMPRESSION: 1. No evidence of pulmonary embolism. 2. Interval development of a combination of atelectasis and airspace consolidation (i.e., postobstructive pneumonia) in the left upper lobe. No centrally obstructing lesion identified. 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 3. Previously noted small right-sided pulmonary nodules are stable, likely benign. Continued attention on follow-up studies is recommended. 4. Aortic atherosclerosis, in addition to left anterior descending coronary artery disease. Assessment for potential risk factor modification, dietary therapy or pharmacologic therapy may be warranted, if clinically indicated. Aortic Atherosclerosis (ICD10-I70.0). Electronically Signed   By: Vinnie Langton M.D.   On: 01/06/2021 06:09      ASSESSMENT/PLAN   Presets with barking cough and sob, has left upper lobe pneumonia, radiation pneumonitis, suspect airway fibrosis/scarring and narrowing and no frank endobronchial lesions, + bronchiectasis, flu and covid negative. Has had levaquin and doxycycline thus for this -agree with rocep and zitro -sputun c/s if possible -mrsa, strep, rsv serologies ( done) -urine legionella ag -mucinex -flutter valve.  -consider mucomyst 4 cc bid -dvt prophyaxis ( lovonox) -hold on bronch fpr now and concentrate on pulmonary toilet and treating theinfection -following with you  Sleep apnea on CPAP. Wears it most night.but not recently due to cough.  Continue cpap on same settings on d/c Keep on  oxygen as is    Thank you for allowing me to participate in the care of this patient.   Patient/Family are satisfied with care plan and all questions have been answered.  This document was prepared using Dragon voice recognition software and may include unintentional dictation errors.     Wallene Huh, M.D.  Division of [place]

## 2021-01-06 NOTE — ED Provider Notes (Signed)
[facility] Emergency Department Provider Note  ____________________________________________  Time seen: Approximately 6:16 AM  I have reviewed the triage vital signs and the nursing notes.   HISTORY  Chief Complaint Shortness of Breath   HPI Jennifer Hodges is a 75 y.o. female with a history of bronchiectasis, lung cancer status post chemo and radiation, sleep apnea who presents for evaluation of shortness of breath.  Patient has had shortness of breath and cough for the last 3 weeks.  Has finished a course of azithromycin and Levaquin with no significant improvement.  Symptoms of gotten progressively worse over the last couple of days.  Went to urgent care where she was found to be hypoxic.  She was given duo nebs and transfer to [place] for further evaluation.  Patient reports that she has been sleeping in her recliner due to severe constant shortness of breath which is worse with minimal ambulation.  Has had a cough productive of green/yellow sputum.  Has had tightness in her chest but denies fever or chills.  No prior history of PE or DVT, no leg pain or swelling.  She does have noticed some hemoptysis at home.   Past Medical History:  Diagnosis Date   Anemia    Asthma    No Inhalers--Dr. [name] will order as needed   Bronchiectasis ([name])    mild   Chronic headaches     followed by [facility] migraines   COPD (chronic obstructive pulmonary disease) (HCC)    DDD (degenerative disc disease), lumbar    Diverticulosis    Family history of adverse reaction to anesthesia    PONV   Gall stones    history of   GERD (gastroesophageal reflux disease)    EGD 8/09- non bleeding erosive gastritis, documentd esophageal ulcerations.    Hiatal hernia    History of pneumonia    Hypercholesterolemia    IBS (irritable bowel syndrome)    Malignant neoplasm of upper lobe of left lung ([name]) 12/27/2018   Meniere disease    Murmur    Osteoarthritis    lumbar disc  disease, left hip   Personal history of chemotherapy    Personal history of radiation therapy    PONV (postoperative nausea and vomiting)    "Only with last hip and I believe it was due to the morphine"    Sleep apnea    cpap asked to bring mask and tubing   Vertigo    Weakness of right side of body     Patient Active Problem List   Diagnosis Date Noted   Benign breast lumps 01/11/2019   Gout 01/11/2019   Hives 01/11/2019   Lung disease 01/11/2019   Migraine headache 01/11/2019   Goals of care, counseling/discussion 12/27/2018   Malignant neoplasm of upper lobe of left lung ([name]) 12/27/2018   Night sweats 08/07/2016   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   Osteopenia of multiple sites 04/27/2016   Left carpal tunnel syndrome 02/02/2016   Carotid stenosis, asymptomatic, bilateral 12/29/2015   Prediabetes 09/28/2015   Primary osteoarthritis involving multiple joints 09/28/2015   Knee pain 05/11/2015   Chest tightness 03/24/2015   Stool incontinence 03/24/2015   Urinary frequency 03/04/2015   Cervical spondylosis with radiculopathy 02/24/2015   Pre-syncope 02/19/2015   URI (upper respiratory infection) 02/15/2015   Neck pain 12/25/2014   Pelvic pain in female 11/16/2014   Primary osteoarthritis of hip 08/05/2014   Heel pain 06/01/2014   Diarrhea 06/01/2014   Health care maintenance  06/01/2014   Right leg pain 03/30/2014   Right arm pain 03/30/2014   Sinusitis 03/30/2014   Internal nasal lesion 11/12/2013   Other specified disorders of nose and nasal sinuses 11/12/2013   Chronic tension-type headache, intractable 10/30/2013   Intractable migraine without aura and without status migrainosus 10/30/2013   Occipital neuralgia of left side 10/30/2013   IBS (irritable bowel syndrome) 09/03/2013   Rib pain 08/04/2013   Cervical radiculitis 08/01/2013   Lumbar radiculitis 08/01/2013   Palpitations 07/25/2013   Benign essential hypertension 05/28/2013   Neuralgia, neuritis,  and radiculitis, unspecified 05/28/2013   Vitamin D deficiency 05/28/2013   Elevated AFP 04/28/2013   Abnormality of alpha-fetoprotein 04/28/2013   Osteoporosis 11/12/2012   Incomplete emptying of bladder 07/11/2012   Prolapse of vaginal vault after hysterectomy 07/11/2012   Nonspecific (abnormal) findings on radiological and other examination of gastrointestinal tract 05/31/2012   Bronchiectasis ([place]) 02/05/2012   Sleep apnea 02/05/2012   Degenerative disc disease, cervical 02/05/2012   Hypercholesterolemia 02/05/2012   GERD (gastroesophageal reflux disease) 02/05/2012   Chronic headaches 02/05/2012    Past Surgical History:  Procedure Laterality Date   ABDOMINAL HYSTERECTOMY  age 12   ANTERIOR CERVICAL DECOMP/DISCECTOMY FUSION N/A 02/24/2015   Procedure: CERVICAL FOUR-FIVE, CERVICAL FIVE-SIX, CERVICAL SIX-SEVEN ANTERIOR CERVICAL DECOMPRESSION/DISCECTOMY FUSION ;  Surgeon: [name], MD;  Location: [place] [facility];  Service: Neurosurgery;  Laterality: N/A;  C45 C56 C67 anterior cervical decompression with fusion interbody prosthesis plating and bonegraft   APPENDECTOMY     BACK SURGERY     4th lumbar fusion   BLADDER SURGERY N/A    with vaginal wall repair   BREAST CYST ASPIRATION Bilateral    neg   BREAST SURGERY Bilateral    cyst removed and reduction   CARDIAC CATHETERIZATION  2014   CARPAL TUNNEL RELEASE Right 02/11/2016   Procedure: CARPAL TUNNEL RELEASE;  Surgeon: [name], MD;  Location: [facility] ORS;  Service: Orthopedics;  Laterality: Right;   CATARACT EXTRACTION W/ INTRAOCULAR LENS IMPLANT Bilateral 2015   CHOLECYSTECTOMY     EUS N/A 05/31/2012   Procedure: UPPER ENDOSCOPIC ULTRASOUND (EUS) LINEAR;  Surgeon: [name], MD;  Location: [facility];  Service: Endoscopy;  Laterality: N/A;   EXCISIONAL HEMORRHOIDECTOMY     JOINT REPLACEMENT Bilateral    KNEE ARTHROSCOPY WITH LATERAL MENISECTOMY Right 07/07/2015   Procedure: KNEE ARTHROSCOPY WITH LATERAL  MENISECTOMY, PARTIAL SYNOVECTOMY;  Surgeon: [name], MD;  Location: [facility] ORS;  Service: Orthopedics;  Laterality: Right;   LUMBAR LAMINECTOMY     PORTA CATH INSERTION N/A 01/07/2019   Procedure: PORTA CATH INSERTION;  Surgeon: [name], MD;  Location: [place] [facility];  Service: Cardiovascular;  Laterality: N/A;   REDUCTION MAMMAPLASTY  1990   RIGHT OOPHORECTOMY     TOTAL HIP ARTHROPLASTY Left 05/01/2014   Dr. [name]   TOTAL HIP ARTHROPLASTY Right 08/05/2014   Procedure: TOTAL HIP ARTHROPLASTY ANTERIOR APPROACH;  Surgeon: [name], MD;  Location: [facility] ORS;  Service: Orthopedics;  Laterality: Right;   ULNAR NERVE TRANSPOSITION Right 02/11/2016   Procedure: ULNAR NERVE DECOMPRESSION/TRANSPOSITION;  Surgeon: [name], MD;  Location: [facility] ORS;  Service: Orthopedics;  Laterality: Right;   VIDEO BRONCHOSCOPY WITH ENDOBRONCHIAL ULTRASOUND Left 12/19/2018   Procedure: VIDEO BRONCHOSCOPY WITH ENDOBRONCHIAL ULTRASOUND, LEFT, SLEEP APNEA;  Surgeon: [name], MD;  Location: [facility] ORS;  Service: Thoracic;  Laterality: Left;    Prior to Admission medications   Medication Sig Start Date End Date Taking? Authorizing  Provider  albuterol (PROVENTIL) (2.5 MG/3ML) 0.083% nebulizer solution Take 2.5 mg by nebulization every 6 (six) hours as needed for wheezing.    [provider]  albuterol (VENTOLIN HFA) 108 (90 Base) MCG/ACT inhaler Inhale 2 puffs into the lungs every 6 (six) hours as needed for wheezing or shortness of breath. 10/18/18   Katy Apo, NP  azithromycin (ZITHROMAX) 250 MG tablet Take 2 tablets on day 1 (500 mg) and 1 tablet (250 mg) until complete. 11/27/20   Jacquelin Hawking, NP  Bioflavonoid Products (ESTER C PO) Take 1 tablet by mouth 3 (three) times daily.    [provider]  Calcium-Magnesium-Zinc (CAL-MAG-ZINC PO) Take 1 tablet by mouth daily.    [provider]  cetirizine (ZYRTEC) 5 MG tablet Take 1 tablet (5 mg total) by mouth  daily. 04/17/18   Cook, Jayce G, DO  CHLORASEPTIC MAX SORE THROAT 1.5-33 % LIQD Use as directed 1 spray in the mouth or throat as needed (throat irritation.).     [provider]  Cholecalciferol (EQL VITAMIN D3) 25 MCG (1000 UT) capsule Take 4,000 Units by mouth daily.    [provider]  Clotrimazole 1 % OINT Apply 1 application topically in the morning, at noon, and at bedtime. 12/19/19   Sindy Guadeloupe, MD  Coenzyme Q10 (CO Q 10) 100 MG CAPS Take 100 mg by mouth daily.     [provider]  Cyanocobalamin (VITAMIN B-12) 2500 MCG SUBL Place 2,500 mcg under the tongue daily.    [provider]  cyclobenzaprine (FLEXERIL) 10 MG tablet Take 10 mg by mouth at bedtime.    [provider]  DHEA 25 MG CAPS Take 25 mg by mouth daily.    [provider]  Ensure (ENSURE) Take 237 mLs by mouth 2 (two) times daily between meals.    [provider]  ezetimibe (ZETIA) 10 MG tablet Take 5 mg by mouth in the morning and at bedtime.  10/26/18   [provider]  folic acid (FOLVITE) 024 MCG tablet Take 400 mcg by mouth 2 (two) times daily.     [provider]  Garlic 0973 MG CAPS Take 1,000 mg by mouth daily.    [provider]  guaiFENesin-dextromethorphan (ROBITUSSIN DM) 100-10 MG/5ML syrup Take 5 mLs by mouth every 4 (four) hours as needed for cough.    [provider]  Histamine Dihydrochloride (AUSTRALIAN DREAM ARTHRITIS EX) Apply 1 application topically 4 (four) times daily as needed (pain.).    [provider]  HYDROcodone-acetaminophen (NORCO/VICODIN) 5-325 MG tablet 1-2 tabs po bid prn Patient taking differently: Take 1 tablet by mouth at bedtime. 12/09/18   Norval Gable, MD  ipratropium (ATROVENT) 0.03 % nasal spray SMARTSIG:1-2 Spray(s) Both Nares 3 Times Daily PRN 04/27/20   [provider]  lidocaine-prilocaine (EMLA) cream lidocaine-prilocaine 2.5 %-2.5 % topical cream    [provider]  meclizine (ANTIVERT) 25 MG tablet Take 1 tablet (25 mg total) by mouth every 6 (six) hours as needed for dizziness. 06/11/19   Sindy Guadeloupe, MD  montelukast (SINGULAIR) 10 MG tablet Take 10 mg by mouth at bedtime.  11/22/18   [provider]  ondansetron (ZOFRAN-ODT) 4 MG disintegrating tablet Take 4 mg by mouth every 8 (eight) hours as needed for nausea/vomiting. 08/27/18   [provider]  Polyethyl Glycol-Propyl Glycol (SYSTANE) 0.4-0.3 % SOLN Place 1 drop into both eyes 5 (five) times daily as needed (dry/irritated eyes).  [provider]  pyridOXINE (VITAMIN B-6) 100 MG tablet Take 100 mg by mouth daily.    [provider]  Selenium 200 MCG CAPS Take 200 mcg by mouth daily.    [provider]  sucralfate (CARAFATE) 1 g tablet 1 tablet 3 (three) times daily as needed.  02/13/19   [provider]  triamcinolone (KENALOG) 0.1 % Apply 1 application topically 2 (two) times daily. 03/24/20   Margarette Canada, NP  UBRELVY 100 MG TABS Take by mouth. 01/30/20   [provider]  Vitamin A 2250 MCG (7500 UT) CAPS Take 1 capsule by mouth daily.     [provider]  vitamin E 400 UNIT capsule Take 400 Units by mouth daily.    [provider]  Zoledronic Acid (RECLAST IV) Inject 1 Dose into the vein as directed. ONCE A YEAR    [provider]  prochlorperazine (COMPAZINE) 10 MG tablet TAKE 1 TABLET(10 MG) BY MOUTH EVERY 6 HOURS AS NEEDED FOR NAUSEA OR VOMITING 01/06/19 03/22/19  Sindy Guadeloupe, MD    Allergies Aspirin, Celebrex [celecoxib], Morphine and related, Adhesive [tape], Clarithromycin, Codeine, Darvon [propoxyphene hcl], Demerol [meperidine], Flonase [fluticasone propionate], Simvastatin, and Talwin [pentazocine]  Family History  Problem Relation Age of Onset   Heart disease Mother        s/p stent   Hypertension Mother    Hypercholesterolemia Mother    Diabetes Father    Stomach cancer Other         uncle   Breast cancer Cousin    Breast cancer Paternal Aunt     Social History Social History   Tobacco Use   Smoking status: Former    Types: Cigarettes    Quit date: 06/13/2007    Years since quitting: 13.5   Smokeless tobacco: Never  Vaping Use   Vaping Use: Never used  Substance Use Topics   Alcohol use: No    Alcohol/week: 0.0 standard drinks   Drug use: No    Review of Systems  Constitutional: Negative for fever. Eyes: Negative for visual changes. ENT: Negative for sore throat. Neck: No neck pain  Cardiovascular: Negative for chest pain. Respiratory: +shortness of breath, cough, hemoptysis Gastrointestinal: Negative for abdominal pain, vomiting or diarrhea. Genitourinary: Negative for dysuria. Musculoskeletal: Negative for back pain. Skin: Negative for rash. Neurological: Negative for headaches, weakness or numbness. Psych: No SI or HI  ____________________________________________   PHYSICAL EXAM:  VITAL SIGNS: ED Triage Vitals [01/05/21 1829]  Enc Vitals Group     BP 132/74     Pulse Rate 98     Resp 20     Temp 98.7 F (37.1 C)     Temp Source Oral     SpO2 93 %     Weight      Height      Head Circumference      Peak Flow      Pain Score      Pain Loc      Pain Edu?      Excl. in [place]?     Constitutional: Alert and oriented, moderate respiratory distress.  HEENT:      Head: Normocephalic and atraumatic.         Eyes: Conjunctivae are normal. Sclera is non-icteric.       Mouth/Throat: Mucous membranes are moist.       Neck: Supple with no signs of meningismus. Cardiovascular: Regular rate and rhythm. No murmurs, gallops, or rubs. 2+ symmetrical distal  pulses are present in all extremities. No JVD. Respiratory: Increased work of breathing, tachypneic, speaking on 3 word sentences, bilateral rhonchi and wheezing, hypoxic on 2 L nasal cannula Gastrointestinal: Soft, non tender, and non distended with positive bowel sounds. No rebound or  guarding. Genitourinary: No CVA tenderness. Musculoskeletal:  No edema, cyanosis, or erythema of extremities. Neurologic: Normal speech and language. Face is symmetric. Moving all extremities. No gross focal neurologic deficits are appreciated. Skin: Skin is warm, dry and intact. No rash noted. Psychiatric: Mood and affect are normal. Speech and behavior are normal.  ____________________________________________   LABS (all labs ordered are listed, but only abnormal results are displayed)  Labs Reviewed  CBC WITH DIFFERENTIAL/PLATELET - Abnormal; Notable for the following components:      Result Value   Eosinophils Absolute 1.3 (*)    All other components within normal limits  COMPREHENSIVE METABOLIC PANEL - Abnormal; Notable for the following components:   Glucose, Bld 130 (*)    BUN 7 (*)    Creatinine, Ser 0.37 (*)    All other components within normal limits  RESP PANEL BY RT-PCR (FLU A&B, COVID) ARPGX2  TROPONIN I (HIGH SENSITIVITY)  TROPONIN I (HIGH SENSITIVITY)   ____________________________________________  EKG  ED ECG REPORT I, Rudene Re, the attending physician, personally viewed and interpreted this ECG.  Sinus rhythm with a rate of 99, normal intervals, normal axis, no ST elevations or depressions. ____________________________________________  RADIOLOGY  I have personally reviewed the images performed during this visit and I agree with the Radiologist's read.   Interpretation by Radiologist:  DG Chest 2 View  Result Date: 01/05/2021 CLINICAL DATA:  Shortness of breath EXAM: CHEST - 2 VIEW COMPARISON:  01/04/2021 FINDINGS: Right Port-A-Cath remains in place, unchanged. Heart is normal size. Chronic left suprahilar/apical scarring, stable. Continued left apical airspace opacity, similar to prior study. Right lung is clear. No acute confluent opacities or effusions. No acute bony abnormality. IMPRESSION: Stable chronic changes. Continued left apical  airspace opacity which could reflect pneumonia or recurrent malignancy. Findings unchanged since prior study. Electronically Signed   By: Rolm Baptise M.D.   On: 01/05/2021 18:32   CT Angio Chest PE W and/or Wo Contrast  Result Date: 01/06/2021 CLINICAL DATA:  75 year old female with progressive worsening shortness of breath over the past 3 weeks. Suspected pulmonary embolism. EXAM: CT ANGIOGRAPHY CHEST WITH CONTRAST TECHNIQUE: Multidetector CT imaging of the chest was performed using the standard protocol during bolus administration of intravenous contrast. Multiplanar CT image reconstructions and MIPs were obtained to evaluate the vascular anatomy. CONTRAST:  6mL OMNIPAQUE IOHEXOL 350 MG/ML SOLN COMPARISON:  CT the chest, abdomen and pelvis 11/17/2020. FINDINGS: Cardiovascular: No filling defects within the pulmonary arterial tree to suggest pulmonary embolism. Heart size is normal. There is no significant pericardial fluid, thickening or pericardial calcification. There is aortic atherosclerosis, as well as atherosclerosis of the great vessels of the mediastinum and the coronary arteries, including calcified atherosclerotic plaque in the left anterior descending coronary artery. Mediastinum/Nodes: No pathologically enlarged mediastinal or hilar lymph nodes. Esophagus is unremarkable in appearance. No axillary lymphadenopathy. Lungs/Pleura: Chronic mass-like architectural distortion in the perihilar aspect of the left lung involving portions of the left upper lobe and superior segment of the left lower lobe. New compared to the recent prior examination is a combination of atelectasis and airspace consolidation in the left upper lobe which is now essentially completely non aerated. There is compensatory hyperexpansion of the left lower lobe. 5 mm ground-glass attenuation  nodule in the medial aspect of the right lower lobe (axial image 65 of series 8), unchanged. Clustered peripheral micro nodularity in the  right lower lobe (axial image 53 of series 8), also unchanged, likely chronic mucoid impaction within terminal bronchioles. No other definite new suspicious appearing pulmonary nodules or masses are noted. No acute consolidative airspace disease. No pleural effusions. Mild diffuse bronchial wall thickening with mild centrilobular and paraseptal emphysema. Upper Abdomen: Aortic atherosclerosis. Musculoskeletal: There are no aggressive appearing lytic or blastic lesions noted in the visualized portions of the skeleton. Review of the MIP images confirms the above findings. IMPRESSION: 1. No evidence of pulmonary embolism. 2. Interval development of a combination of atelectasis and airspace consolidation (i.e., postobstructive pneumonia) in the left upper lobe. No centrally obstructing lesion identified. 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 3. Previously noted small right-sided pulmonary nodules are stable, likely benign. Continued attention on follow-up studies is recommended. 4. Aortic atherosclerosis, in addition to left anterior descending coronary artery disease. Assessment for potential risk factor modification, dietary therapy or pharmacologic therapy may be warranted, if clinically indicated. Aortic Atherosclerosis (ICD10-I70.0). Electronically Signed   By: Vinnie Langton M.D.   On: 01/06/2021 06:09     ____________________________________________   PROCEDURES  Procedure(s) performed:yes .1-3 Lead EKG Interpretation Performed by: Rudene Re, MD Authorized by: Rudene Re, MD     Interpretation: non-specific     ECG rate assessment: normal     Rhythm: sinus rhythm     Ectopy: none     Conduction: normal     Critical Care performed: yes  CRITICAL CARE Performed by: Rudene Re  ?  Total critical care time: 35 min  Critical care time was exclusive of separately billable procedures and treating other patients.  Critical  care was necessary to treat or prevent imminent or life-threatening deterioration.  Critical care was time spent personally by me on the following activities: development of treatment plan with patient and/or surrogate as well as nursing, discussions with consultants, evaluation of patient's response to treatment, examination of patient, obtaining history from patient or surrogate, ordering and performing treatments and interventions, ordering and review of laboratory studies, ordering and review of radiographic studies, pulse oximetry and re-evaluation of patient's condition.  ____________________________________________   INITIAL IMPRESSION / ASSESSMENT AND PLAN / ED COURSE  75 y.o. female with a history of bronchiectasis, lung cancer status post chemo and radiation, sleep apnea who presents for evaluation of shortness of breath, productive cough x3 weeks.  Patient has been on a full course of azithromycin and Levaquin with no significant relief.  She is in moderate respiratory distress with coarse rhonchi and wheezing bilaterally, hypoxic requiring 2 L of oxygen, speaking in 3 word sentences.  CT with no evidence of PE but does show a large area of airspace consolidation in the left upper lobe with some atelectasis and concerning for possible postobstructive pneumonia.  No signs of sepsis.  Will initiate cefepime and vancomycin IV.  Will give DuoNebs and steroids.  Will admit to the hospitalist service. Marland Kitchen  COVID and flu negative.  EKG and troponins with no demand ischemia.  No signs of sepsis with no fever, no tachycardia, no leukocytosis.  Will consult the hospitalist for admission.      _____________________________________________ Please note:  Patient was evaluated in Emergency Department today for the symptoms described in the history of present illness. Patient was evaluated in the context of the global COVID-19 pandemic, which necessitated consideration that the patient  might be at risk for  infection with the SARS-CoV-2 virus that causes COVID-19. Institutional protocols and algorithms that pertain to the evaluation of patients at risk for COVID-19 are in a state of rapid change based on information released by regulatory bodies including the CDC and federal and state organizations. These policies and algorithms were followed during the patient's care in the ED.  Some ED evaluations and interventions may be delayed as a result of limited staffing during the pandemic.   [place] Controlled Substance Database was reviewed by me. ____________________________________________   FINAL CLINICAL IMPRESSION(S) / ED DIAGNOSES   Final diagnoses:  Acute respiratory failure with hypoxia ([place])  Postobstructive pneumonia      NEW MEDICATIONS STARTED DURING THIS VISIT:  ED Discharge Orders     None        Note:  This document was prepared using Dragon voice recognition software and may include unintentional dictation errors.    Rudene Re, MD 01/06/21 [phone]

## 2021-01-06 NOTE — Progress Notes (Signed)
Patient to [facility] and back to bed with x1 assist.

## 2021-01-07 DIAGNOSIS — J189 Pneumonia, unspecified organism: Secondary | ICD-10-CM | POA: Diagnosis not present

## 2021-01-07 LAB — BASIC METABOLIC PANEL
Anion gap: 7 (ref 5–15)
BUN: 9 mg/dL (ref 8–23)
CO2: 31 mmol/L (ref 22–32)
Calcium: 9.2 mg/dL (ref 8.9–10.3)
Chloride: 98 mmol/L (ref 98–111)
Creatinine, Ser: 0.55 mg/dL (ref 0.44–1.00)
GFR, Estimated: >60 mL/min (ref >60)
Glucose, Bld: 111 mg/dL — ABNORMAL HIGH (ref 70–99)
Potassium: 4.1 mmol/L (ref 3.5–5.1)
Sodium: 136 mmol/L (ref 135–145)

## 2021-01-07 LAB — CBC
HCT: 38.1 % (ref 36.0–46.0)
Hemoglobin: 13.2 g/dL (ref 12.0–15.0)
MCH: 32.2 pg (ref 26.0–34.0)
MCHC: 34.6 g/dL (ref 30.0–36.0)
MCV: 92.9 fL (ref 80.0–100.0)
Platelets: 239 10*3/uL (ref 150–400)
RBC: 4.1 MIL/uL (ref 3.87–5.11)
RDW: 12.4 % (ref 11.5–15.5)
WBC: 7.2 10*3/uL (ref 4.0–10.5)
nRBC: 0 % (ref 0.0–0.2)

## 2021-01-07 NOTE — Progress Notes (Signed)
Pulmonary Medicine          Date: 01/07/2021,   MRN# [id number] BRIANNE MAINA 07-Oct-1945     A HISTORY OF PRESENT ILLNESS   On nursing floor, speech and strength some better. Less sob but still wheezing. The cough is some better. Discussed the findings and the need to stay for a couple of days.    PAST MEDICAL HISTORY   Past Medical History:  Diagnosis Date   Anemia    Asthma    No Inhalers--Dr. [name] will order as needed   Bronchiectasis ([place])    mild   Chronic headaches     followed by [facility] migraines   COPD (chronic obstructive pulmonary disease) (HCC)    DDD (degenerative disc disease), lumbar    Diverticulosis    Family history of adverse reaction to anesthesia    PONV   Gall stones    history of   GERD (gastroesophageal reflux disease)    EGD 8/09- non bleeding erosive gastritis, documentd esophageal ulcerations.    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   Meniere disease    Murmur    Osteoarthritis    lumbar disc disease, left hip   Personal history of chemotherapy    Personal history of radiation therapy    PONV (postoperative nausea and vomiting)    "Only with last hip and I believe it was due to the morphine"    Sleep apnea    cpap asked to bring mask and tubing   Vertigo    Weakness of right side of body      SURGICAL HISTORY   Past Surgical History:  Procedure Laterality Date   ABDOMINAL HYSTERECTOMY  age 75   ANTERIOR CERVICAL DECOMP/DISCECTOMY FUSION N/A 02/24/2015   Procedure: CERVICAL FOUR-FIVE, CERVICAL FIVE-SIX, CERVICAL SIX-SEVEN ANTERIOR CERVICAL DECOMPRESSION/DISCECTOMY FUSION ;  Surgeon: [name], MD;  Location: [facility];  Service: Neurosurgery;  Laterality: N/A;  C45 C56 C67 anterior cervical decompression with fusion interbody prosthesis plating and bonegraft   APPENDECTOMY      BACK SURGERY     4th lumbar fusion   BLADDER SURGERY N/A    with vaginal wall repair   BREAST CYST ASPIRATION Bilateral    neg   BREAST SURGERY Bilateral    cyst removed and reduction   CARDIAC CATHETERIZATION  2014   CARPAL TUNNEL RELEASE Right 02/11/2016   Procedure: CARPAL TUNNEL RELEASE;  Surgeon: [name], MD;  Location: [facility];  Service: Orthopedics;  Laterality: Right;   CATARACT EXTRACTION W/ INTRAOCULAR LENS IMPLANT Bilateral 2015   CHOLECYSTECTOMY     EUS N/A 05/31/2012   Procedure: UPPER ENDOSCOPIC ULTRASOUND (EUS) LINEAR;  Surgeon: [name], MD;  Location: [facility];  Service: Endoscopy;  Laterality: N/A;   EXCISIONAL HEMORRHOIDECTOMY     JOINT REPLACEMENT Bilateral    KNEE ARTHROSCOPY WITH LATERAL MENISECTOMY Right 07/07/2015   Procedure: KNEE ARTHROSCOPY WITH LATERAL MENISECTOMY, PARTIAL SYNOVECTOMY;  Surgeon: [name], MD;  Location: [facility];  Service: Orthopedics;  Laterality: Right;   LUMBAR LAMINECTOMY     PORTA CATH INSERTION N/A 01/07/2019   Procedure: PORTA CATH INSERTION;  Surgeon: [name], MD;  Location: [facility];  Service: Cardiovascular;  Laterality: N/A;   REDUCTION MAMMAPLASTY  1990   RIGHT OOPHORECTOMY  TOTAL HIP ARTHROPLASTY Left 05/01/2014   Dr. [name]   TOTAL HIP ARTHROPLASTY Right 08/05/2014   Procedure: TOTAL HIP ARTHROPLASTY ANTERIOR APPROACH;  Surgeon: [name], MD;  Location: [facility];  Service: Orthopedics;  Laterality: Right;   ULNAR NERVE TRANSPOSITION Right 02/11/2016   Procedure: ULNAR NERVE DECOMPRESSION/TRANSPOSITION;  Surgeon: [name], MD;  Location: [facility];  Service: Orthopedics;  Laterality: Right;   VIDEO BRONCHOSCOPY WITH ENDOBRONCHIAL ULTRASOUND Left 12/19/2018   Procedure: VIDEO BRONCHOSCOPY WITH ENDOBRONCHIAL ULTRASOUND, LEFT, SLEEP APNEA;  Surgeon: [name], MD;  Location: [facility];  Service: Thoracic;  Laterality: Left;     FAMILY HISTORY   Family History   Problem Relation Age of Onset   Heart disease Mother        s/p stent   Hypertension Mother    Hypercholesterolemia Mother    Diabetes Father    Stomach cancer Other        uncle   Breast cancer Cousin    Breast cancer Paternal Aunt      SOCIAL HISTORY   Social History   Tobacco Use   Smoking status: Former    Types: Cigarettes    Quit date: 06/13/2007    Years since quitting: 13.5   Smokeless tobacco: Never  Vaping Use   Vaping Use: Never used  Substance Use Topics   Alcohol use: No    Alcohol/week: 0.0 standard drinks   Drug use: No     MEDICATIONS    Home Medication:    Current Medication:  Current Facility-Administered Medications:    acetylcysteine (MUCOMYST) 20 % nebulizer / oral solution 2 mL, 2 mL, Nebulization, BID, [name], Victoriano Campion E, MD, 2 mL at 01/06/21 1939   albuterol (PROVENTIL) (2.5 MG/3ML) 0.083% nebulizer solution 2.5 mg, 2.5 mg, Nebulization, Q6H PRN, Agbata, Tochukwu, MD, 2.5 mg at 01/07/21 1550   azithromycin (ZITHROMAX) 500 mg in sodium chloride 0.9 % 250 mL IVPB, 500 mg, Intravenous, Q24H, Agbata, Tochukwu, MD, Stopped at 01/07/21 1203   benzonatate (TESSALON) capsule 200 mg, 200 mg, Oral, TID, Agbata, Tochukwu, MD, 200 mg at 01/07/21 1551   cefTRIAXone (ROCEPHIN) 2 g in sodium chloride 0.9 % 100 mL IVPB, 2 g, Intravenous, Q24H, Agbata, Tochukwu, MD, Stopped at 01/06/21 1353   chlorpheniramine-HYDROcodone (TUSSIONEX) 10-8 MG/5ML suspension 5 mL, 5 mL, Oral, Q12H PRN, Tamala Julian, Rondell A, MD, 5 mL at 01/07/21 0258   cholecalciferol (VITAMIN D) tablet 4,000 Units, 4,000 Units, Oral, Daily, Agbata, Tochukwu, MD, 4,000 Units at 01/07/21 1141   cyclobenzaprine (FLEXERIL) tablet 10 mg, 10 mg, Oral, QHS, Agbata, Tochukwu, MD, 10 mg at 01/06/21 2106   enoxaparin (LOVENOX) injection 40 mg, 40 mg, Subcutaneous, Q24H, Agbata, Tochukwu, MD, 40 mg at 01/06/21 1936   ezetimibe (ZETIA) tablet 5 mg, 5 mg, Oral, Daily, Agbata, Tochukwu, MD, 5 mg at 52/77/82 4235    folic acid (FOLVITE) tablet 1 mg, 1 mg, Oral, BID, Noralee Space, RPH, 1 mg at 01/07/21 1144   HYDROcodone-acetaminophen (NORCO/VICODIN) 5-325 MG per tablet 1 tablet, 1 tablet, Oral, QHS, Smith, Rondell A, MD, 1 tablet at 01/06/21 2106   loratadine (CLARITIN) tablet 10 mg, 10 mg, Oral, Daily, Agbata, Tochukwu, MD, 10 mg at 01/07/21 1144   meclizine (ANTIVERT) tablet 25 mg, 25 mg, Oral, Q6H PRN, Agbata, Tochukwu, MD   montelukast (SINGULAIR) tablet 10 mg, 10 mg, Oral, QHS, Agbata, Tochukwu, MD, 10 mg at 01/06/21 2107   polyethylene glycol (MIRALAX / GLYCOLAX) packet 17 g, 17 g, Oral, Daily, Agbata, Tochukwu, MD,  17 g at 01/06/21 1006   pyridOXINE (VITAMIN B-6) tablet 100 mg, 100 mg, Oral, Daily, Agbata, Tochukwu, MD, 100 mg at 01/07/21 1146   Ubrogepant TABS, , Oral, Daily, Agbata, Tochukwu, MD   vitamin B-12 (CYANOCOBALAMIN) tablet 2,500 mcg, 2,500 mcg, Oral, Daily, Agbata, Tochukwu, MD, 2,500 mcg at 01/07/21 1142    ALLERGIES   Aspirin, Celebrex [celecoxib], Morphine and related, Adhesive [tape], Clarithromycin, Codeine, Darvon [propoxyphene hcl], Demerol [meperidine], Flonase [fluticasone propionate], Simvastatin, and Talwin [pentazocine]     REVIEW OF SYSTEMS    Review of Systems:  Gen:  Denies  fever, sweats, chills weigh loss  HEENT: Denies blurred vision, double vision, ear pain, eye pain, hearing loss, nose bleeds, sore throat Cardiac:  No dizziness, chest pain or heaviness, chest tightness,edema Resp:   ++ cough or sputum porduction, + shortness of breath, ++wheezing, - hemoptysis,  Gi: Denies swallowing difficulty, stomach pain, nausea or vomiting, diarrhea, constipation, bowel incontinence Gu:  Denies bladder incontinence, burning urine Ext:   Denies Joint pain, stiffness or swelling Skin: Denies  skin rash, easy bruising or bleeding or hives Endoc:  Denies polyuria, polydipsia , polyphagia or weight change Psych:   Denies depression, insomnia or hallucinations    Other:  All other systems negative   VS: BP 115/63 (BP Location: Right Arm)   Pulse 87   Temp 98.1 F (36.7 C) (Oral)   Resp 20   Wt 76 kg   SpO2 94%   BMI 31.66 kg/m      PHYSICAL EXAM    GENERAL:NAD, brighter affect HEAD: Normocephalic, atraumatic.  EYES: Pupils equal, round, reactive to light. Extraocular muscles intact. No scleral icterus.  MOUTH: Moist mucosal membrane. Dentition intact. No abscess noted.  EAR, NOSE, THROAT: Clear without exudates. No external lesions.  NECK: Supple. No thyromegaly. No nodules. No JVD.  PULMONARY: Diffuse coarse rhonchi wheezing through but moving air. No ue of accessarymuscles CARDIOVASCULAR: S1 and S2. Regular rate and rhythm. No murmurs, rubs, or gallops. No edema. Pedal pulses 2+ bilaterally.  GASTROINTESTINAL: Soft, nontender, nondistended. No masses. Positive bowel sounds. No hepatosplenomegaly.  MUSCULOSKELETAL: No swelling, clubbing, or edema. Range of motion full in all extremities.  NEUROLOGIC: Cranial nerves II through XII are intact. No gross focal neurological deficits. Sensation intact. Reflexes intact.  SKIN: No ulceration, lesions, rashes, or cyanosis. Skin warm and dry. Turgor intact.  PSYCHIATRIC: Mood, affect within normal limits. The patient is awake, alert and oriented x 3. Insight, judgment intact.       IMAGING    DG Chest 2 View  Result Date: 01/05/2021 CLINICAL DATA:  Shortness of breath EXAM: CHEST - 2 VIEW COMPARISON:  01/04/2021 FINDINGS: Right Port-A-Cath remains in place, unchanged. Heart is normal size. Chronic left suprahilar/apical scarring, stable. Continued left apical airspace opacity, similar to prior study. Right lung is clear. No acute confluent opacities or effusions. No acute bony abnormality. IMPRESSION: Stable chronic changes. Continued left apical airspace opacity which could reflect pneumonia or recurrent malignancy. Findings unchanged since prior study. Electronically Signed   By: Rolm Baptise M.D.   On: 01/05/2021 18:32   DG Chest 2 View  Result Date: 01/05/2021 CLINICAL DATA:  Patient complains of persistent cough, wheezing, congestion, weakness and shortness of breaths for 1.5 months. Patient reports recent bronchitis and malignant neoplasm of left upper lobe of the lung. History of asthma, porta cath and chronic obstructive pulmonary disease. Former smoker. EXAM: CHEST - 2 VIEW COMPARISON:  CT chest-abdomen-pelvis 11/17/2020; X-ray chest 02/18/2015. FINDINGS: Right-sided  chest port remains in place with distal tip terminating at the level of the distal SVC. Normal heart size. Atherosclerotic calcification of the aortic knob. Chronic left suprahilar/apical scarring. New left apical opacity compared to the previous CT. Right lung is clear. No pleural effusion or pneumothorax. IMPRESSION: New left apical airspace opacity, which may represent pneumonia. Recurrent/progressive malignancy not excluded given proximity to the post treatment changes within the left suprahilar/apical region. Radiographic follow-up is recommended to ensure resolution. Electronically Signed   By: Davina Poke D.O.   On: 01/05/2021 15:29   CT Angio Chest PE W and/or Wo Contrast  Result Date: 01/06/2021 CLINICAL DATA:  75 year old female with progressive worsening shortness of breath over the past 3 weeks. Suspected pulmonary embolism. EXAM: CT ANGIOGRAPHY CHEST WITH CONTRAST TECHNIQUE: Multidetector CT imaging of the chest was performed using the standard protocol during bolus administration of intravenous contrast. Multiplanar CT image reconstructions and MIPs were obtained to evaluate the vascular anatomy. CONTRAST:  87mL OMNIPAQUE IOHEXOL 350 MG/ML SOLN COMPARISON:  CT the chest, abdomen and pelvis 11/17/2020. FINDINGS: Cardiovascular: No filling defects within the pulmonary arterial tree to suggest pulmonary embolism. Heart size is normal. There is no significant pericardial fluid, thickening or pericardial  calcification. There is aortic atherosclerosis, as well as atherosclerosis of the great vessels of the mediastinum and the coronary arteries, including calcified atherosclerotic plaque in the left anterior descending coronary artery. Mediastinum/Nodes: No pathologically enlarged mediastinal or hilar lymph nodes. Esophagus is unremarkable in appearance. No axillary lymphadenopathy. Lungs/Pleura: Chronic mass-like architectural distortion in the perihilar aspect of the left lung involving portions of the left upper lobe and superior segment of the left lower lobe. New compared to the recent prior examination is a combination of atelectasis and airspace consolidation in the left upper lobe which is now essentially completely non aerated. There is compensatory hyperexpansion of the left lower lobe. 5 mm ground-glass attenuation nodule in the medial aspect of the right lower lobe (axial image 65 of series 8), unchanged. Clustered peripheral micro nodularity in the right lower lobe (axial image 53 of series 8), also unchanged, likely chronic mucoid impaction within terminal bronchioles. No other definite new suspicious appearing pulmonary nodules or masses are noted. No acute consolidative airspace disease. No pleural effusions. Mild diffuse bronchial wall thickening with mild centrilobular and paraseptal emphysema. Upper Abdomen: Aortic atherosclerosis. Musculoskeletal: There are no aggressive appearing lytic or blastic lesions noted in the visualized portions of the skeleton. Review of the MIP images confirms the above findings. IMPRESSION: 1. No evidence of pulmonary embolism. 2. Interval development of a combination of atelectasis and airspace consolidation (i.e., postobstructive pneumonia) in the left upper lobe. No centrally obstructing lesion identified. 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 3. Previously noted small right-sided pulmonary nodules are stable, likely benign.  Continued attention on follow-up studies is recommended. 4. Aortic atherosclerosis, in addition to left anterior descending coronary artery disease. Assessment for potential risk factor modification, dietary therapy or pharmacologic therapy may be warranted, if clinically indicated. Aortic Atherosclerosis (ICD10-I70.0). Electronically Signed   By: Vinnie Langton M.D.   On: 01/06/2021 06:09      ASSESSMENT/PLAN   Presents with barking cough and sob, has left upper lobe pneumonia, radiation pneumonitis, suspect airway fibrosis/scarring and narrowing and no frank endobronchial lesions, + bronchiectasis, flu and covid negative. Has had levaquin and doxycycline thus for this with no improvement. Today seem stronger but stiil quite rhonchus and wheezing through out.  -continue rocep and zitro -mucinex -mucomyst -  Incentive spirometry -flutter valve.  -continue out patient pulm regimen     Thank you for allowing me to participate in the care of this patient.   Patient/Family are satisfied with care plan and all questions have been answered.  This document was prepared using Dragon voice recognition software and may include unintentional dictation errors.     Wallene Huh, M.D.  Division of [facility]

## 2021-01-07 NOTE — Progress Notes (Signed)
PROGRESS NOTE    Jennifer Hodges  VZC:[id number] DOB: May 16, 1945 DOA: 01/06/2021 PCP: [name], MD    Brief Narrative:  Patient is a 75 year old female with a history of adenocarcinoma of the lung status post chemo and radiation therapy as well as immunotherapy who presents to the ER for evaluation of a 3-week history of worsening shortness of breath mostly with exertion associated with a cough productive of yellowish-green phlegm.  Patient has completed 2 courses of antibiotic therapy as an outpatient without improvement in her symptoms. Imaging shows postobstructive pneumonia  10/27 quite short of breath still with minimal improvement  Consultants:  Oncology, pulmonology  Procedures:   Antimicrobials:  Ceftriaxone and azithromycin   Subjective: No chest pain or dizziness  Objective: Vitals:   01/07/21 0200 01/07/21 0300 01/07/21 0400 01/07/21 0600  BP: (!) 146/75 138/71 136/65 (!) 163/76  Pulse: 75 73 73 76  Resp: 19 16 17  (!) 22  Temp:      TempSrc:      SpO2: 95% 96% 95% 95%  Weight:        Intake/Output Summary (Last 24 hours) at 01/07/2021 0904 Last data filed at 01/06/2021 1530 Gross per 24 hour  Intake --  Output 900 ml  Net -900 ml   Filed Weights   01/06/21 0636  Weight: 76 kg    Examination:  General exam: Appears calm and comfortable  Respiratory system: Rhonchorous, minimal expiratory wheezing  cardiovascular system: S1 & S2 heard, RRR. No JVD, murmurs, rubs, gallops or clicks.  Gastrointestinal system: Abdomen is nondistended, soft and nontender. No organomegaly or masses felt. Normal bowel sounds heard. Central nervous system: Alert and oriented. Extremities: No edema Psychiatry: Judgement and insight appear normal. Mood & affect appropriate.     Data Reviewed: I have personally reviewed following labs and imaging studies  CBC: Recent Labs  Lab 01/05/21 1830 01/07/21 0706  WBC 7.4 7.2  NEUTROABS 4.7  --   HGB 13.7 13.2   HCT 39.8 38.1  MCV 93.0 92.9  PLT 246 128   Basic Metabolic Panel: Recent Labs  Lab 01/05/21 1830 01/07/21 0706  NA 139 136  K 3.9 4.1  CL 103 98  CO2 28 31  GLUCOSE 130* 111*  BUN 7* 9  CREATININE 0.37* 0.55  CALCIUM 9.0 9.2   GFR: CrCl cannot be calculated (Unknown ideal weight.). Liver Function Tests: Recent Labs  Lab 01/05/21 1830  AST 19  ALT 15  ALKPHOS 76  BILITOT 0.8  PROT 7.2  ALBUMIN 4.1   No results for input(s): LIPASE, AMYLASE in the last 168 hours. No results for input(s): AMMONIA in the last 168 hours. Coagulation Profile: No results for input(s): INR, PROTIME in the last 168 hours. Cardiac Enzymes: No results for input(s): CKTOTAL, CKMB, CKMBINDEX, TROPONINI in the last 168 hours. BNP (last 3 results) No results for input(s): PROBNP in the last 8760 hours. HbA1C: No results for input(s): HGBA1C in the last 72 hours. CBG: No results for input(s): GLUCAP in the last 168 hours. Lipid Profile: No results for input(s): CHOL, HDL, LDLCALC, TRIG, CHOLHDL, LDLDIRECT in the last 72 hours. Thyroid Function Tests: No results for input(s): TSH, T4TOTAL, FREET4, T3FREE, THYROIDAB in the last 72 hours. Anemia Panel: No results for input(s): VITAMINB12, FOLATE, FERRITIN, TIBC, IRON, RETICCTPCT in the last 72 hours. Sepsis Labs: No results for input(s): PROCALCITON, LATICACIDVEN in the last 168 hours.  Recent Results (from the past 240 hour(s))  Resp Panel by RT-PCR (Flu A&B,  Covid) Nasopharyngeal Swab     Status: None   Collection Time: 01/05/21  8:33 PM   Specimen: Nasopharyngeal Swab; Nasopharyngeal(NP) swabs in vial transport medium  Result Value Ref Range Status   SARS Coronavirus 2 by RT PCR NEGATIVE NEGATIVE Final    Comment: (NOTE) SARS-CoV-2 target nucleic acids are NOT DETECTED.  The SARS-CoV-2 RNA is generally detectable in upper respiratory specimens during the acute phase of infection. The lowest concentration of SARS-CoV-2 viral copies  this assay can detect is 138 copies/mL. A negative result does not preclude SARS-Cov-2 infection and should not be used as the sole basis for treatment or other patient management decisions. A negative result may occur with  improper specimen collection/handling, submission of specimen other than nasopharyngeal swab, presence of viral mutation(s) within the areas targeted by this assay, and inadequate number of viral copies(<138 copies/mL). A negative result must be combined with clinical observations, patient history, and epidemiological information. The expected result is Negative.  Fact Sheet for Patients:  EntrepreneurPulse.com.au  Fact Sheet for Healthcare Providers:  IncredibleEmployment.be  This test is no t yet approved or cleared by the [place] FDA and  has been authorized for detection and/or diagnosis of SARS-CoV-2 by FDA under an Emergency Use Authorization (EUA). This EUA will remain  in effect (meaning this test can be used) for the duration of the COVID-19 declaration under Section 564(b)(1) of the Act, 21 U.S.C.section 360bbb-3(b)(1), unless the authorization is terminated  or revoked sooner.       Influenza A by PCR NEGATIVE NEGATIVE Final   Influenza B by PCR NEGATIVE NEGATIVE Final    Comment: (NOTE) The Xpert Xpress SARS-CoV-2/FLU/RSV plus assay is intended as an aid in the diagnosis of influenza from Nasopharyngeal swab specimens and should not be used as a sole basis for treatment. Nasal washings and aspirates are unacceptable for Xpert Xpress SARS-CoV-2/FLU/RSV testing.  Fact Sheet for Patients: EntrepreneurPulse.com.au  Fact Sheet for Healthcare Providers: IncredibleEmployment.be  This test is not yet approved or cleared by the [place] FDA and has been authorized for detection and/or diagnosis of SARS-CoV-2 by FDA under an Emergency Use Authorization (EUA). This EUA  will remain in effect (meaning this test can be used) for the duration of the COVID-19 declaration under Section 564(b)(1) of the Act, 21 U.S.C. section 360bbb-3(b)(1), unless the authorization is terminated or revoked.  Performed at [facility], [address] [place], [place] [address]   Expectorated Sputum Assessment w Gram Stain, Rflx to Resp Cult     Status: None   Collection Time: 01/06/21  9:36 PM   Specimen: Expectorated Sputum  Result Value Ref Range Status   Specimen Description EXPECTORATED SPUTUM  Final   Special Requests NONE  Final   Sputum evaluation   Final    THIS SPECIMEN IS ACCEPTABLE FOR SPUTUM CULTURE Performed at [facility], [address], [place], [place] [address]    Report Status 01/06/2021 FINAL  Final  Culture, Respiratory w Gram Stain     Status: None (Preliminary result)   Collection Time: 01/06/21  9:36 PM  Result Value Ref Range Status   Specimen Description   Final    EXPECTORATED SPUTUM Performed at [facility], [address], [place], [place] [address]    Special Requests   Final    NONE Reflexed from [id number] Performed at [facility], [address], [place], [place] [address]    Gram Stain   Final    RARE SQUAMOUS  EPITHELIAL CELLS PRESENT NO WBC SEEN RARE GRAM POSITIVE COCCI Performed at [facility], [place] [address] [place], [place] [address]    Culture PENDING  Incomplete   Report Status PENDING  Incomplete         Radiology Studies: DG Chest 2 View  Result Date: 01/05/2021 CLINICAL DATA:  Shortness of breath EXAM: CHEST - 2 VIEW COMPARISON:  01/04/2021 FINDINGS: Right Port-A-Cath remains in place, unchanged. Heart is normal size. Chronic left suprahilar/apical scarring, stable. Continued left apical airspace opacity, similar to prior study. Right lung is clear. No acute confluent opacities or effusions. No acute bony abnormality. IMPRESSION: Stable chronic changes. Continued  left apical airspace opacity which could reflect pneumonia or recurrent malignancy. Findings unchanged since prior study. Electronically Signed   By: Rolm Baptise M.D.   On: 01/05/2021 18:32   CT Angio Chest PE W and/or Wo Contrast  Result Date: 01/06/2021 CLINICAL DATA:  75 year old female with progressive worsening shortness of breath over the past 3 weeks. Suspected pulmonary embolism. EXAM: CT ANGIOGRAPHY CHEST WITH CONTRAST TECHNIQUE: Multidetector CT imaging of the chest was performed using the standard protocol during bolus administration of intravenous contrast. Multiplanar CT image reconstructions and MIPs were obtained to evaluate the vascular anatomy. CONTRAST:  52mL OMNIPAQUE IOHEXOL 350 MG/ML SOLN COMPARISON:  CT the chest, abdomen and pelvis 11/17/2020. FINDINGS: Cardiovascular: No filling defects within the pulmonary arterial tree to suggest pulmonary embolism. Heart size is normal. There is no significant pericardial fluid, thickening or pericardial calcification. There is aortic atherosclerosis, as well as atherosclerosis of the great vessels of the mediastinum and the coronary arteries, including calcified atherosclerotic plaque in the left anterior descending coronary artery. Mediastinum/Nodes: No pathologically enlarged mediastinal or hilar lymph nodes. Esophagus is unremarkable in appearance. No axillary lymphadenopathy. Lungs/Pleura: Chronic mass-like architectural distortion in the perihilar aspect of the left lung involving portions of the left upper lobe and superior segment of the left lower lobe. New compared to the recent prior examination is a combination of atelectasis and airspace consolidation in the left upper lobe which is now essentially completely non aerated. There is compensatory hyperexpansion of the left lower lobe. 5 mm ground-glass attenuation nodule in the medial aspect of the right lower lobe (axial image 65 of series 8), unchanged. Clustered peripheral micro  nodularity in the right lower lobe (axial image 53 of series 8), also unchanged, likely chronic mucoid impaction within terminal bronchioles. No other definite new suspicious appearing pulmonary nodules or masses are noted. No acute consolidative airspace disease. No pleural effusions. Mild diffuse bronchial wall thickening with mild centrilobular and paraseptal emphysema. Upper Abdomen: Aortic atherosclerosis. Musculoskeletal: There are no aggressive appearing lytic or blastic lesions noted in the visualized portions of the skeleton. Review of the MIP images confirms the above findings. IMPRESSION: 1. No evidence of pulmonary embolism. 2. Interval development of a combination of atelectasis and airspace consolidation (i.e., postobstructive pneumonia) in the left upper lobe. No centrally obstructing lesion identified. 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 3. Previously noted small right-sided pulmonary nodules are stable, likely benign. Continued attention on follow-up studies is recommended. 4. Aortic atherosclerosis, in addition to left anterior descending coronary artery disease. Assessment for potential risk factor modification, dietary therapy or pharmacologic therapy may be warranted, if clinically indicated. Aortic Atherosclerosis (ICD10-I70.0). Electronically Signed   By: Vinnie Langton M.D.   On: 01/06/2021 06:09        Scheduled Meds:  acetylcysteine  2 mL Nebulization BID  benzonatate  200 mg Oral TID   cholecalciferol  4,000 Units Oral Daily   cyclobenzaprine  10 mg Oral QHS   enoxaparin (LOVENOX) injection  40 mg Subcutaneous Q24H   ezetimibe  5 mg Oral Daily   folic acid  1 mg Oral BID   HYDROcodone-acetaminophen  1 tablet Oral QHS   loratadine  10 mg Oral Daily   montelukast  10 mg Oral QHS   polyethylene glycol  17 g Oral Daily   pyridOXINE  100 mg Oral Daily   Ubrogepant   Oral Daily   vitamin B-12  2,500 mcg Oral Daily   Continuous  Infusions:  azithromycin Stopped (01/06/21 1118)   cefTRIAXone (ROCEPHIN)  IV Stopped (01/06/21 1357)    Assessment & Plan:   Principal Problem:   Postobstructive pneumonia Active Problems:   Sleep apnea   GERD (gastroesophageal reflux disease)   Malignant neoplasm of upper lobe of left lung (HCC)   Benign essential hypertension   Patient is a 75 year old female with a history of adenocarcinoma of the left lung admitted to the hospital for postobstructive pneumonia.       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 10/27 if has recurrent symptoms oncology will obtain PET/CT followed by consideration for bronc.  However she would like to avoid PET scan in the presence of acute infection.     DVT prophylaxis: Lovenox Code Status: DNR Family Communication: None at bedside Home Disposition Plan: Home Status is: Inpatient  Remains inpatient appropriate because: Receiving IV treatments for severity of her illness requiring hospitalization.  Quite symptomatic and short of breath.            LOS: 1 day   Time spent: 35 minutes with more  than 50% on [place], MD [facility] Pager [phone]  If 7PM-7AM, please contact night-coverage 01/07/2021, 9:04 AM

## 2021-01-08 DIAGNOSIS — J189 Pneumonia, unspecified organism: Secondary | ICD-10-CM | POA: Diagnosis not present

## 2021-01-08 LAB — LEGIONELLA PNEUMOPHILA SEROGP 1 UR AG: L. pneumophila Serogp 1 Ur Ag: NEGATIVE

## 2021-01-08 MED ORDER — HYDROCODONE-ACETAMINOPHEN 5-325 MG PO TABS
1.0000 | ORAL_TABLET | Freq: Every day | ORAL | Status: DC
  Administered 2021-01-09 – 2021-01-11 (×3): 1 via ORAL
  Filled 2021-01-08 (×3): qty 1

## 2021-01-08 MED ORDER — HYDROCODONE-ACETAMINOPHEN 5-325 MG PO TABS
1.0000 | ORAL_TABLET | Freq: Once | ORAL | Status: AC
Start: 2021-01-08 — End: 2021-01-08
  Administered 2021-01-08: 1 via ORAL

## 2021-01-08 MED ORDER — CHLORHEXIDINE GLUCONATE CLOTH 2 % EX PADS
6.0000 | MEDICATED_PAD | Freq: Every day | CUTANEOUS | Status: DC
  Administered 2021-01-08 – 2021-01-12 (×5): 6 via TOPICAL

## 2021-01-08 NOTE — Progress Notes (Signed)
PROGRESS NOTE    Jennifer BROUSSEAU  ELF:[id number] DOB: November 16, 1945 DOA: 01/06/2021 PCP: [name], MD    Brief Narrative:  Patient is a 75 year old female with a history of adenocarcinoma of the lung status post chemo and radiation therapy as well as immunotherapy who presents to the ER for evaluation of a 3-week history of worsening shortness of breath mostly with exertion associated with a cough productive of yellowish-green phlegm.  Patient has completed 2 courses of antibiotic therapy as an outpatient without improvement in her symptoms. Imaging shows postobstructive pneumonia  10/28 reports sob improving but not at baseline. No cp .  Consultants:  Oncology, pulmonology  Procedures:   Antimicrobials:  Ceftriaxone and azithromycin   Subjective: No abdominal pain, no dizziness  Objective: Vitals:   01/07/21 1953 01/08/21 0425 01/08/21 0740 01/08/21 0755  BP: 132/68 (!) 117/57  127/69  Pulse: 98 72  95  Resp: 16 16  17   Temp: 97.7 F (36.5 C) 98.1 F (36.7 C)  98.5 F (36.9 C)  TempSrc: Oral Oral    SpO2: 95% 93% 91% 91%  Weight:        Intake/Output Summary (Last 24 hours) at 01/08/2021 0841 Last data filed at 01/08/2021 5852 Gross per 24 hour  Intake 619.51 ml  Output 700 ml  Net -80.49 ml   Filed Weights   01/06/21 0636  Weight: 76 kg    Examination: NAD, calm Rhonchorous bilaterally but improved since yesterday no wheezing Regular S1-S2 no gallops Soft benign positive bowel sounds No edema Awake and oriented x3   Data Reviewed: I have personally reviewed following labs and imaging studies  CBC: Recent Labs  Lab 01/05/21 1830 01/07/21 0706  WBC 7.4 7.2  NEUTROABS 4.7  --   HGB 13.7 13.2  HCT 39.8 38.1  MCV 93.0 92.9  PLT 246 778   Basic Metabolic Panel: Recent Labs  Lab 01/05/21 1830 01/07/21 0706  NA 139 136  K 3.9 4.1  CL 103 98  CO2 28 31  GLUCOSE 130* 111*  BUN 7* 9  CREATININE 0.37* 0.55  CALCIUM 9.0 9.2    GFR: CrCl cannot be calculated (Unknown ideal weight.). Liver Function Tests: Recent Labs  Lab 01/05/21 1830  AST 19  ALT 15  ALKPHOS 76  BILITOT 0.8  PROT 7.2  ALBUMIN 4.1   No results for input(s): LIPASE, AMYLASE in the last 168 hours. No results for input(s): AMMONIA in the last 168 hours. Coagulation Profile: No results for input(s): INR, PROTIME in the last 168 hours. Cardiac Enzymes: No results for input(s): CKTOTAL, CKMB, CKMBINDEX, TROPONINI in the last 168 hours. BNP (last 3 results) No results for input(s): PROBNP in the last 8760 hours. HbA1C: No results for input(s): HGBA1C in the last 72 hours. CBG: No results for input(s): GLUCAP in the last 168 hours. Lipid Profile: No results for input(s): CHOL, HDL, LDLCALC, TRIG, CHOLHDL, LDLDIRECT in the last 72 hours. Thyroid Function Tests: No results for input(s): TSH, T4TOTAL, FREET4, T3FREE, THYROIDAB in the last 72 hours. Anemia Panel: No results for input(s): VITAMINB12, FOLATE, FERRITIN, TIBC, IRON, RETICCTPCT in the last 72 hours. Sepsis Labs: No results for input(s): PROCALCITON, LATICACIDVEN in the last 168 hours.  Recent Results (from the past 240 hour(s))  Resp Panel by RT-PCR (Flu A&B, Covid) Nasopharyngeal Swab     Status: None   Collection Time: 01/05/21  8:33 PM   Specimen: Nasopharyngeal Swab; Nasopharyngeal(NP) swabs in vial transport medium  Result Value Ref Range  Status   SARS Coronavirus 2 by RT PCR NEGATIVE NEGATIVE Final    Comment: (NOTE) SARS-CoV-2 target nucleic acids are NOT DETECTED.  The SARS-CoV-2 RNA is generally detectable in upper respiratory specimens during the acute phase of infection. The lowest concentration of SARS-CoV-2 viral copies this assay can detect is 138 copies/mL. A negative result does not preclude SARS-Cov-2 infection and should not be used as the sole basis for treatment or other patient management decisions. A negative result may occur with  improper specimen  collection/handling, submission of specimen other than nasopharyngeal swab, presence of viral mutation(s) within the areas targeted by this assay, and inadequate number of viral copies(<138 copies/mL). A negative result must be combined with clinical observations, patient history, and epidemiological information. The expected result is Negative.  Fact Sheet for Patients:  EntrepreneurPulse.com.au  Fact Sheet for Healthcare Providers:  IncredibleEmployment.be  This test is no t yet approved or cleared by the [place] FDA and  has been authorized for detection and/or diagnosis of SARS-CoV-2 by FDA under an Emergency Use Authorization (EUA). This EUA will remain  in effect (meaning this test can be used) for the duration of the COVID-19 declaration under Section 564(b)(1) of the Act, 21 U.S.C.section 360bbb-3(b)(1), unless the authorization is terminated  or revoked sooner.       Influenza A by PCR NEGATIVE NEGATIVE Final   Influenza B by PCR NEGATIVE NEGATIVE Final    Comment: (NOTE) The Xpert Xpress SARS-CoV-2/FLU/RSV plus assay is intended as an aid in the diagnosis of influenza from Nasopharyngeal swab specimens and should not be used as a sole basis for treatment. Nasal washings and aspirates are unacceptable for Xpert Xpress SARS-CoV-2/FLU/RSV testing.  Fact Sheet for Patients: EntrepreneurPulse.com.au  Fact Sheet for Healthcare Providers: IncredibleEmployment.be  This test is not yet approved or cleared by the [place] FDA and has been authorized for detection and/or diagnosis of SARS-CoV-2 by FDA under an Emergency Use Authorization (EUA). This EUA will remain in effect (meaning this test can be used) for the duration of the COVID-19 declaration under Section 564(b)(1) of the Act, 21 U.S.C. section 360bbb-3(b)(1), unless the authorization is terminated or revoked.  Performed at [facility], [address] [place], [place] [address]   Expectorated Sputum Assessment w Gram Stain, Rflx to Resp Cult     Status: None   Collection Time: 01/06/21  9:36 PM   Specimen: Expectorated Sputum  Result Value Ref Range Status   Specimen Description EXPECTORATED SPUTUM  Final   Special Requests NONE  Final   Sputum evaluation   Final    THIS SPECIMEN IS ACCEPTABLE FOR SPUTUM CULTURE Performed at [facility], [address], [place], [place] [address]    Report Status 01/06/2021 FINAL  Final  Culture, Respiratory w Gram Stain     Status: None (Preliminary result)   Collection Time: 01/06/21  9:36 PM  Result Value Ref Range Status   Specimen Description   Final    EXPECTORATED SPUTUM Performed at [facility] [address], [place], [place] [address]    Special Requests   Final    NONE Reflexed from [id number] Performed at [facility], [address] [place], [place] [address]    Gram Stain   Final    RARE SQUAMOUS EPITHELIAL CELLS PRESENT NO WBC SEEN RARE GRAM POSITIVE COCCI    Culture   Final    CULTURE REINCUBATED FOR BETTER GROWTH Performed at [facility], [place]  [address], [place], [place] [address]    Report Status PENDING  Incomplete         Radiology Studies: No results found.      Scheduled Meds:  acetylcysteine  2 mL Nebulization BID   benzonatate  200 mg Oral TID   Chlorhexidine Gluconate Cloth  6 each Topical Daily   cholecalciferol  4,000 Units Oral Daily   cyclobenzaprine  10 mg Oral QHS   enoxaparin (LOVENOX) injection  40 mg Subcutaneous Q24H   ezetimibe  5 mg Oral Daily   folic acid  1 mg Oral BID   HYDROcodone-acetaminophen  1 tablet Oral QHS   loratadine  10 mg Oral Daily   montelukast  10 mg Oral QHS   polyethylene glycol  17 g Oral Daily   pyridOXINE  100 mg Oral Daily   Ubrogepant   Oral Daily   vitamin B-12  2,500 mcg Oral Daily   Continuous Infusions:  azithromycin 500 mg (01/08/21  0808)   cefTRIAXone (ROCEPHIN)  IV 2 g (01/07/21 1829)    Assessment & Plan:   Principal Problem:   Postobstructive pneumonia Active Problems:   Sleep apnea   GERD (gastroesophageal reflux disease)   Malignant neoplasm of upper lobe of left lung (HCC)   Benign essential hypertension   Patient is a 75 year old female with a history of adenocarcinoma of the left lung admitted to the hospital for postobstructive pneumonia.       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  if has recurrent symptoms oncology will obtain PET/CT followed by consideration for bronc.  However she would like to avoid PET scan in the presence of acute infection.   10/28 bronchoscopy on hold currently as above   DVT prophylaxis: Lovenox Code Status: DNR Family Communication: None at bedside Disposition Plan: Home Status is: Inpatient  Remains inpatient appropriate because: Receiving IV treatments for severity of her illness requiring hospitalization.  Quite symptomatic and short of  breath.   Possible dc over the weekned if continues to improve          LOS: 2 days   Time spent: 35 minutes with more than 50% on [place], MD [facility] Pager [phone]  If 7PM-7AM, please contact night-coverage 01/08/2021, 8:41 AM

## 2021-01-08 NOTE — Progress Notes (Signed)
Patient requesting that bed alarm be turned off. She says she is independent and does not want to call for help getting up.

## 2021-01-08 NOTE — Progress Notes (Signed)
Pulmonary Medicine          Date: 01/08/2021,     HISTORY OF PRESENT ILLNESS   Slowing improving over all affect brighter, less wheezing and coughing. Still sound coarse. Will stay the course   PAST MEDICAL HISTORY   Past Medical History:  Diagnosis Date   Anemia    Asthma    No Inhalers--Dr. [name] will order as needed   Bronchiectasis ([place])    mild   Chronic headaches     followed by [facility] migraines   COPD (chronic obstructive pulmonary disease) (HCC)    DDD (degenerative disc disease), lumbar    Diverticulosis    Family history of adverse reaction to anesthesia    PONV   Gall stones    history of   GERD (gastroesophageal reflux disease)    EGD 8/09- non bleeding erosive gastritis, documentd esophageal ulcerations.    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   Meniere disease    Murmur    Osteoarthritis    lumbar disc disease, left hip   Personal history of chemotherapy    Personal history of radiation therapy    PONV (postoperative nausea and vomiting)    "Only with last hip and I believe it was due to the morphine"    Sleep apnea    cpap asked to bring mask and tubing   Vertigo    Weakness of right side of body      SURGICAL HISTORY   Past Surgical History:  Procedure Laterality Date   ABDOMINAL HYSTERECTOMY  age 75   ANTERIOR CERVICAL DECOMP/DISCECTOMY FUSION N/A 02/24/2015   Procedure: CERVICAL FOUR-FIVE, CERVICAL FIVE-SIX, CERVICAL SIX-SEVEN ANTERIOR CERVICAL DECOMPRESSION/DISCECTOMY FUSION ;  Surgeon: [name], MD;  Location: [facility];  Service: Neurosurgery;  Laterality: N/A;  C45 C56 C67 anterior cervical decompression with fusion interbody prosthesis plating and bonegraft   APPENDECTOMY     BACK SURGERY     4th lumbar fusion   BLADDER SURGERY N/A    with vaginal wall repair   BREAST CYST  ASPIRATION Bilateral    neg   BREAST SURGERY Bilateral    cyst removed and reduction   CARDIAC CATHETERIZATION  2014   CARPAL TUNNEL RELEASE Right 02/11/2016   Procedure: CARPAL TUNNEL RELEASE;  Surgeon: [name], MD;  Location: [facility];  Service: Orthopedics;  Laterality: Right;   CATARACT EXTRACTION W/ INTRAOCULAR LENS IMPLANT Bilateral 2015   CHOLECYSTECTOMY     EUS N/A 05/31/2012   Procedure: UPPER ENDOSCOPIC ULTRASOUND (EUS) LINEAR;  Surgeon: [name], MD;  Location: [facility];  Service: Endoscopy;  Laterality: N/A;   EXCISIONAL HEMORRHOIDECTOMY     JOINT REPLACEMENT Bilateral    KNEE ARTHROSCOPY WITH LATERAL MENISECTOMY Right 07/07/2015   Procedure: KNEE ARTHROSCOPY WITH LATERAL MENISECTOMY, PARTIAL SYNOVECTOMY;  Surgeon: [name], MD;  Location: [facility];  Service: Orthopedics;  Laterality: Right;   LUMBAR LAMINECTOMY     PORTA CATH INSERTION N/A 01/07/2019   Procedure: PORTA CATH INSERTION;  Surgeon: [name], MD;  Location: [facility];  Service: Cardiovascular;  Laterality: N/A;   REDUCTION MAMMAPLASTY  1990   RIGHT OOPHORECTOMY     TOTAL HIP ARTHROPLASTY Left 05/01/2014   Dr. [name]   TOTAL HIP ARTHROPLASTY Right 08/05/2014   Procedure: TOTAL HIP ARTHROPLASTY ANTERIOR  APPROACH;  Surgeon: [name], MD;  Location: [facility];  Service: Orthopedics;  Laterality: Right;   ULNAR NERVE TRANSPOSITION Right 02/11/2016   Procedure: ULNAR NERVE DECOMPRESSION/TRANSPOSITION;  Surgeon: [name], MD;  Location: [facility];  Service: Orthopedics;  Laterality: Right;   VIDEO BRONCHOSCOPY WITH ENDOBRONCHIAL ULTRASOUND Left 12/19/2018   Procedure: VIDEO BRONCHOSCOPY WITH ENDOBRONCHIAL ULTRASOUND, LEFT, SLEEP APNEA;  Surgeon: [name], MD;  Location: [facility];  Service: Thoracic;  Laterality: Left;     FAMILY HISTORY   Family History  Problem Relation Age of Onset   Heart disease Mother        s/p stent   Hypertension Mother     Hypercholesterolemia Mother    Diabetes Father    Stomach cancer Other        uncle   Breast cancer Cousin    Breast cancer Paternal Aunt      SOCIAL HISTORY   Social History   Tobacco Use   Smoking status: Former    Types: Cigarettes    Quit date: 06/13/2007    Years since quitting: 13.5   Smokeless tobacco: Never  Vaping Use   Vaping Use: Never used  Substance Use Topics   Alcohol use: No    Alcohol/week: 0.0 standard drinks   Drug use: No     MEDICATIONS    Home Medication:    Current Medication:  Current Facility-Administered Medications:    acetylcysteine (MUCOMYST) 20 % nebulizer / oral solution 2 mL, 2 mL, Nebulization, BID, [name], Lillith Mcneff E, MD, 2 mL at 01/08/21 0740   albuterol (PROVENTIL) (2.5 MG/3ML) 0.083% nebulizer solution 2.5 mg, 2.5 mg, Nebulization, Q6H PRN, Agbata, Tochukwu, MD, 2.5 mg at 01/08/21 0740   azithromycin (ZITHROMAX) 500 mg in sodium chloride 0.9 % 250 mL IVPB, 500 mg, Intravenous, Q24H, Agbata, Tochukwu, MD, Last Rate: 250 mL/hr at 01/08/21 0808, 500 mg at 01/08/21 5638   benzonatate (TESSALON) capsule 200 mg, 200 mg, Oral, TID, Agbata, Tochukwu, MD, 200 mg at 01/08/21 0801   cefTRIAXone (ROCEPHIN) 2 g in sodium chloride 0.9 % 100 mL IVPB, 2 g, Intravenous, Q24H, Agbata, Tochukwu, MD, Last Rate: 200 mL/hr at 01/07/21 1829, 2 g at 01/07/21 1829   Chlorhexidine Gluconate Cloth 2 % PADS 6 each, 6 each, Topical, Daily, Amery, Sahar, MD, 6 each at 01/08/21 1019   chlorpheniramine-HYDROcodone (TUSSIONEX) 10-8 MG/5ML suspension 5 mL, 5 mL, Oral, Q12H PRN, Fuller Plan A, MD, 5 mL at 01/07/21 9373   cholecalciferol (VITAMIN D) tablet 4,000 Units, 4,000 Units, Oral, Daily, Agbata, Tochukwu, MD, 4,000 Units at 01/08/21 0800   cyclobenzaprine (FLEXERIL) tablet 10 mg, 10 mg, Oral, QHS, Agbata, Tochukwu, MD, 10 mg at 01/07/21 2029   enoxaparin (LOVENOX) injection 40 mg, 40 mg, Subcutaneous, Q24H, Agbata, Tochukwu, MD, 40 mg at 01/07/21 2030   ezetimibe  (ZETIA) tablet 5 mg, 5 mg, Oral, Daily, Agbata, Tochukwu, MD, 5 mg at 42/87/68 1157   folic acid (FOLVITE) tablet 1 mg, 1 mg, Oral, BID, Noralee Space, RPH, 1 mg at 01/08/21 0801   HYDROcodone-acetaminophen (NORCO/VICODIN) 5-325 MG per tablet 1 tablet, 1 tablet, Oral, QHS, Smith, Rondell A, MD, 1 tablet at 01/07/21 2030   loratadine (CLARITIN) tablet 10 mg, 10 mg, Oral, Daily, Agbata, Tochukwu, MD, 10 mg at 01/08/21 0801   meclizine (ANTIVERT) tablet 25 mg, 25 mg, Oral, Q6H PRN, Agbata, Tochukwu, MD   montelukast (SINGULAIR) tablet 10 mg, 10 mg, Oral, QHS, Agbata, Tochukwu, MD, 10 mg at 01/07/21 2029   polyethylene  glycol (MIRALAX / GLYCOLAX) packet 17 g, 17 g, Oral, Daily, Agbata, Tochukwu, MD, 17 g at 01/08/21 0801   pyridOXINE (VITAMIN B-6) tablet 100 mg, 100 mg, Oral, Daily, Agbata, Tochukwu, MD, 100 mg at 01/08/21 0801   Ubrogepant TABS, , Oral, Daily, Agbata, Tochukwu, MD   vitamin B-12 (CYANOCOBALAMIN) tablet 2,500 mcg, 2,500 mcg, Oral, Daily, Agbata, Tochukwu, MD, 2,500 mcg at 01/08/21 0800    ALLERGIES   Aspirin, Celebrex [celecoxib], Morphine and related, Adhesive [tape], Clarithromycin, Codeine, Darvon [propoxyphene hcl], Demerol [meperidine], Flonase [fluticasone propionate], Simvastatin, and Talwin [pentazocine]     REVIEW OF SYSTEMS    Review of Systems:  Gen:  Denies  fever, sweats, chills weigh loss  HEENT: Denies blurred vision, double vision, ear pain, eye pain, hearing loss, nose bleeds, sore throat Cardiac:  No dizziness, chest pain or heaviness, chest tightness,edema Resp:   less cough or sputum porduction, improving shortness of breath,less wheezing, hemoptysis,  Gi: Denies swallowing difficulty, stomach pain, nausea or vomiting, diarrhea, constipation, bowel incontinence Gu:  Denies bladder incontinence, burning urine Ext:   Denies Joint pain, stiffness or swelling Skin: Denies  skin rash, easy bruising or bleeding or hives Endoc:  Denies polyuria,  polydipsia , polyphagia or weight change Psych:   Denies depression, insomnia or hallucinations   Other:  All other systems negative   VS: BP 127/69 (BP Location: Left Arm)   Pulse 95   Temp 98.5 F (36.9 C)   Resp 17   Wt 76 kg   SpO2 91%   BMI 31.66 kg/m      PHYSICAL EXAM    GENERAL:NAD, no fevers, chills, no weakness no fatigue HEAD: Normocephalic, atraumatic.  EYES: Pupils equal, round, reactive to light. Extraocular muscles intact. No scleral icterus.  MOUTH: Moist mucosal membrane. Dentition intact. No abscess noted.  EAR, NOSE, THROAT: Clear without exudates. No external lesions.  NECK: Supple. No thyromegaly. No nodules. No JVD.  PULMONARY: Diffuse coarse rhonchi less wheezes CARDIOVASCULAR: S1 and S2. Regular rate and rhythm. No murmurs, rubs, or gallops. No edema. Pedal pulses 2+ bilaterally.  GASTROINTESTINAL: Soft, nontender, nondistended. No masses. Positive bowel sounds. No hepatosplenomegaly.  MUSCULOSKELETAL: No swelling, clubbing, or edema. Range of motion full in all extremities.  NEUROLOGIC: Cranial nerves II through XII are intact. No gross focal neurological deficits. Sensation intact. Reflexes intact.  SKIN: No ulceration, lesions, rashes, or cyanosis. Skin warm and dry. Turgor intact.  PSYCHIATRIC: Mood, affect within normal limits. The patient is awake, alert and oriented x 3. Insight, judgment intact.       IMAGING    DG Chest 2 View  Result Date: 01/05/2021 CLINICAL DATA:  Shortness of breath EXAM: CHEST - 2 VIEW COMPARISON:  01/04/2021 FINDINGS: Right Port-A-Cath remains in place, unchanged. Heart is normal size. Chronic left suprahilar/apical scarring, stable. Continued left apical airspace opacity, similar to prior study. Right lung is clear. No acute confluent opacities or effusions. No acute bony abnormality. IMPRESSION: Stable chronic changes. Continued left apical airspace opacity which could reflect pneumonia or recurrent malignancy.  Findings unchanged since prior study. Electronically Signed   By: Rolm Baptise M.D.   On: 01/05/2021 18:32   DG Chest 2 View  Result Date: 01/05/2021 CLINICAL DATA:  Patient complains of persistent cough, wheezing, congestion, weakness and shortness of breaths for 1.5 months. Patient reports recent bronchitis and malignant neoplasm of left upper lobe of the lung. History of asthma, porta cath and chronic obstructive pulmonary disease. Former smoker. EXAM: CHEST - 2 VIEW  COMPARISON:  CT chest-abdomen-pelvis 11/17/2020; X-ray chest 02/18/2015. FINDINGS: Right-sided chest port remains in place with distal tip terminating at the level of the distal SVC. Normal heart size. Atherosclerotic calcification of the aortic knob. Chronic left suprahilar/apical scarring. New left apical opacity compared to the previous CT. Right lung is clear. No pleural effusion or pneumothorax. IMPRESSION: New left apical airspace opacity, which may represent pneumonia. Recurrent/progressive malignancy not excluded given proximity to the post treatment changes within the left suprahilar/apical region. Radiographic follow-up is recommended to ensure resolution. Electronically Signed   By: Davina Poke D.O.   On: 01/05/2021 15:29   CT Angio Chest PE W and/or Wo Contrast  Result Date: 01/06/2021 CLINICAL DATA:  75 year old female with progressive worsening shortness of breath over the past 3 weeks. Suspected pulmonary embolism. EXAM: CT ANGIOGRAPHY CHEST WITH CONTRAST TECHNIQUE: Multidetector CT imaging of the chest was performed using the standard protocol during bolus administration of intravenous contrast. Multiplanar CT image reconstructions and MIPs were obtained to evaluate the vascular anatomy. CONTRAST:  25mL OMNIPAQUE IOHEXOL 350 MG/ML SOLN COMPARISON:  CT the chest, abdomen and pelvis 11/17/2020. FINDINGS: Cardiovascular: No filling defects within the pulmonary arterial tree to suggest pulmonary embolism. Heart size is  normal. There is no significant pericardial fluid, thickening or pericardial calcification. There is aortic atherosclerosis, as well as atherosclerosis of the great vessels of the mediastinum and the coronary arteries, including calcified atherosclerotic plaque in the left anterior descending coronary artery. Mediastinum/Nodes: No pathologically enlarged mediastinal or hilar lymph nodes. Esophagus is unremarkable in appearance. No axillary lymphadenopathy. Lungs/Pleura: Chronic mass-like architectural distortion in the perihilar aspect of the left lung involving portions of the left upper lobe and superior segment of the left lower lobe. New compared to the recent prior examination is a combination of atelectasis and airspace consolidation in the left upper lobe which is now essentially completely non aerated. There is compensatory hyperexpansion of the left lower lobe. 5 mm ground-glass attenuation nodule in the medial aspect of the right lower lobe (axial image 65 of series 8), unchanged. Clustered peripheral micro nodularity in the right lower lobe (axial image 53 of series 8), also unchanged, likely chronic mucoid impaction within terminal bronchioles. No other definite new suspicious appearing pulmonary nodules or masses are noted. No acute consolidative airspace disease. No pleural effusions. Mild diffuse bronchial wall thickening with mild centrilobular and paraseptal emphysema. Upper Abdomen: Aortic atherosclerosis. Musculoskeletal: There are no aggressive appearing lytic or blastic lesions noted in the visualized portions of the skeleton. Review of the MIP images confirms the above findings. IMPRESSION: 1. No evidence of pulmonary embolism. 2. Interval development of a combination of atelectasis and airspace consolidation (i.e., postobstructive pneumonia) in the left upper lobe. No centrally obstructing lesion identified. 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 3.  Previously noted small right-sided pulmonary nodules are stable, likely benign. Continued attention on follow-up studies is recommended. 4. Aortic atherosclerosis, in addition to left anterior descending coronary artery disease. Assessment for potential risk factor modification, dietary therapy or pharmacologic therapy may be warranted, if clinically indicated. Aortic Atherosclerosis (ICD10-I70.0). Electronically Signed   By: Vinnie Langton M.D.   On: 01/06/2021 06:09      ASSESSMENT/PLAN    Presents with barking cough and sob, has left upper lobe pneumonia, radiation pneumonitis, suspect airway fibrosis/scarring and narrowing and no frank endobronchial lesions, + bronchiectasis, flu and covid negative. Has had levaquin and doxycycline thus for this with no improvement.   Today seem stronger but stiil  quite rhonchus but less wheezing  -continue rocep and zitro,-mucinex. Mucomyst, Incentive spirometry -flutter valve.  -p ossible d/c in 2-3 days    Thank you for allowing me to participate in the care of this patient.   Patient/Family are satisfied with care plan and all questions have been answered.  This document was prepared using Dragon voice recognition software and may include unintentional dictation errors.     Wallene Huh, M.D.  Division of [place]

## 2021-01-08 NOTE — Care Management Important Message (Signed)
Important Message  Patient Details  Name: Jennifer Hodges MRN: [id number] Date of Birth: [birth year]   Medicare Important Message Given:  Yes     Dannette Barbara 01/08/2021, 10:23 AM

## 2021-01-08 NOTE — Progress Notes (Signed)
Mobility Specialist - Progress Note   01/08/21 1600  Mobility  Activity Ambulated in hall;Stood at bedside  Range of Motion/Exercises Active;Right leg;Left leg  Level of Assistance Standby assist, set-up cues, supervision of patient - no hands on  Assistive Device Front wheel walker  Distance Ambulated (ft) 480 ft  Mobility Ambulated with assistance in hallway  Mobility Response Tolerated well  Mobility performed by Mobility specialist  $Mobility charge 1 Mobility    During mobility: 114 HR, 92% SpO2   Per pt, she was put back on O2 prior to entry d/t low sats. O2 88% on 3L when arrived, but did improve to 93% prior to activity. Pt ambulated in hallway with RW. No LOB. Denied SOB throughout session. Returned to room and performed standing therex. Mildly fatigue. Pt left in bed with needs in reach.    Kathee Delton Mobility Specialist 01/08/21, 4:18 PM

## 2021-01-09 DIAGNOSIS — J189 Pneumonia, unspecified organism: Secondary | ICD-10-CM | POA: Diagnosis not present

## 2021-01-09 LAB — CULTURE, RESPIRATORY W GRAM STAIN: Culture: NORMAL

## 2021-01-09 MED ORDER — GUAIFENESIN ER 600 MG PO TB12
600.0000 mg | ORAL_TABLET | Freq: Two times a day (BID) | ORAL | Status: DC
  Administered 2021-01-09 – 2021-01-12 (×7): 600 mg via ORAL
  Filled 2021-01-09 (×7): qty 1

## 2021-01-09 NOTE — Progress Notes (Signed)
PROGRESS NOTE    Jennifer Hodges  XBD:[id number] DOB: 1945-04-24 DOA: 01/06/2021 PCP: [name], MD    Brief Narrative:  Patient is a 75 year old female with a history of adenocarcinoma of the lung status post chemo and radiation therapy as well as immunotherapy who presents to the ER for evaluation of a 3-week history of worsening shortness of breath mostly with exertion associated with a cough productive of yellowish-green phlegm.  Patient has completed 2 courses of antibiotic therapy as an outpatient without improvement in her symptoms. Imaging shows postobstructive pneumonia 10/29 improving clinically.  Becomes dyspneic with short distance of walking during my observation.  Consultants:  Oncology, pulmonology  Procedures:   Antimicrobials:  Ceftriaxone and azithromycin   Subjective: No abdominal pain, nausea, vomiting  Objective: Vitals:   01/08/21 2131 01/09/21 0302 01/09/21 0726 01/09/21 0731  BP:  120/62  125/62  Pulse:  77  92  Resp:  19  18  Temp:  98.2 F (36.8 C)    TempSrc:  Oral    SpO2: 93% 99% 93% 98%  Weight: 76 kg     Height: 5\' 1"  (1.549 m)       Intake/Output Summary (Last 24 hours) at 01/09/2021 0838 Last data filed at 01/09/2021 0400 Gross per 24 hour  Intake 720 ml  Output 2400 ml  Net -1680 ml   Filed Weights   01/06/21 0636 01/08/21 2131  Weight: 76 kg 76 kg    Examination: NAD calm Rhonchorous, no rales Regular S1-S2 no gallops Soft benign positive bowel sounds No edema Awake and alert and oriented x3 grossly intact Mood and affect appropriate in current setting  Data Reviewed: I have personally reviewed following labs and imaging studies  CBC: Recent Labs  Lab 01/05/21 1830 01/07/21 0706  WBC 7.4 7.2  NEUTROABS 4.7  --   HGB 13.7 13.2  HCT 39.8 38.1  MCV 93.0 92.9  PLT 246 834   Basic Metabolic Panel: Recent Labs  Lab 01/05/21 1830 01/07/21 0706  NA 139 136  K 3.9 4.1  CL 103 98  CO2 28 31  GLUCOSE  130* 111*  BUN 7* 9  CREATININE 0.37* 0.55  CALCIUM 9.0 9.2   GFR: Estimated Creatinine Clearance: 55.8 mL/min (by C-G formula based on SCr of 0.55 mg/dL). Liver Function Tests: Recent Labs  Lab 01/05/21 1830  AST 19  ALT 15  ALKPHOS 76  BILITOT 0.8  PROT 7.2  ALBUMIN 4.1   No results for input(s): LIPASE, AMYLASE in the last 168 hours. No results for input(s): AMMONIA in the last 168 hours. Coagulation Profile: No results for input(s): INR, PROTIME in the last 168 hours. Cardiac Enzymes: No results for input(s): CKTOTAL, CKMB, CKMBINDEX, TROPONINI in the last 168 hours. BNP (last 3 results) No results for input(s): PROBNP in the last 8760 hours. HbA1C: No results for input(s): HGBA1C in the last 72 hours. CBG: No results for input(s): GLUCAP in the last 168 hours. Lipid Profile: No results for input(s): CHOL, HDL, LDLCALC, TRIG, CHOLHDL, LDLDIRECT in the last 72 hours. Thyroid Function Tests: No results for input(s): TSH, T4TOTAL, FREET4, T3FREE, THYROIDAB in the last 72 hours. Anemia Panel: No results for input(s): VITAMINB12, FOLATE, FERRITIN, TIBC, IRON, RETICCTPCT in the last 72 hours. Sepsis Labs: No results for input(s): PROCALCITON, LATICACIDVEN in the last 168 hours.  Recent Results (from the past 240 hour(s))  Resp Panel by RT-PCR (Flu A&B, Covid) Nasopharyngeal Swab     Status: None   Collection  Time: 01/05/21  8:33 PM   Specimen: Nasopharyngeal Swab; Nasopharyngeal(NP) swabs in vial transport medium  Result Value Ref Range Status   SARS Coronavirus 2 by RT PCR NEGATIVE NEGATIVE Final    Comment: (NOTE) SARS-CoV-2 target nucleic acids are NOT DETECTED.  The SARS-CoV-2 RNA is generally detectable in upper respiratory specimens during the acute phase of infection. The lowest concentration of SARS-CoV-2 viral copies this assay can detect is 138 copies/mL. A negative result does not preclude SARS-Cov-2 infection and should not be used as the sole basis for  treatment or other patient management decisions. A negative result may occur with  improper specimen collection/handling, submission of specimen other than nasopharyngeal swab, presence of viral mutation(s) within the areas targeted by this assay, and inadequate number of viral copies(<138 copies/mL). A negative result must be combined with clinical observations, patient history, and epidemiological information. The expected result is Negative.  Fact Sheet for Patients:  EntrepreneurPulse.com.au  Fact Sheet for Healthcare Providers:  IncredibleEmployment.be  This test is no t yet approved or cleared by the [place] FDA and  has been authorized for detection and/or diagnosis of SARS-CoV-2 by FDA under an Emergency Use Authorization (EUA). This EUA will remain  in effect (meaning this test can be used) for the duration of the COVID-19 declaration under Section 564(b)(1) of the Act, 21 U.S.C.section 360bbb-3(b)(1), unless the authorization is terminated  or revoked sooner.       Influenza A by PCR NEGATIVE NEGATIVE Final   Influenza B by PCR NEGATIVE NEGATIVE Final    Comment: (NOTE) The Xpert Xpress SARS-CoV-2/FLU/RSV plus assay is intended as an aid in the diagnosis of influenza from Nasopharyngeal swab specimens and should not be used as a sole basis for treatment. Nasal washings and aspirates are unacceptable for Xpert Xpress SARS-CoV-2/FLU/RSV testing.  Fact Sheet for Patients: EntrepreneurPulse.com.au  Fact Sheet for Healthcare Providers: IncredibleEmployment.be  This test is not yet approved or cleared by the [place] FDA and has been authorized for detection and/or diagnosis of SARS-CoV-2 by FDA under an Emergency Use Authorization (EUA). This EUA will remain in effect (meaning this test can be used) for the duration of the COVID-19 declaration under Section 564(b)(1) of the Act, 21  U.S.C. section 360bbb-3(b)(1), unless the authorization is terminated or revoked.  Performed at [facility], [address] [place], [place] [address]   Expectorated Sputum Assessment w Gram Stain, Rflx to Resp Cult     Status: None   Collection Time: 01/06/21  9:36 PM   Specimen: Expectorated Sputum  Result Value Ref Range Status   Specimen Description EXPECTORATED SPUTUM  Final   Special Requests NONE  Final   Sputum evaluation   Final    THIS SPECIMEN IS ACCEPTABLE FOR SPUTUM CULTURE Performed at [facility], [address], [place], [place] [address]    Report Status 01/06/2021 FINAL  Final  Culture, Respiratory w Gram Stain     Status: None (Preliminary result)   Collection Time: 01/06/21  9:36 PM  Result Value Ref Range Status   Specimen Description   Final    EXPECTORATED SPUTUM Performed at [facility], [address], [place], [place] [address]    Special Requests   Final    NONE Reflexed from [id number] Performed at [facility], [address] [place], [place] [address]    Gram Stain   Final    RARE SQUAMOUS EPITHELIAL CELLS PRESENT NO WBC SEEN RARE GRAM POSITIVE COCCI  Culture   Final    CULTURE REINCUBATED FOR BETTER GROWTH Performed at [facility], [place] [address] [place], [place] [address]    Report Status PENDING  Incomplete         Radiology Studies: No results found.      Scheduled Meds:  acetylcysteine  2 mL Nebulization BID   benzonatate  200 mg Oral TID   Chlorhexidine Gluconate Cloth  6 each Topical Daily   cholecalciferol  4,000 Units Oral Daily   cyclobenzaprine  10 mg Oral QHS   enoxaparin (LOVENOX) injection  40 mg Subcutaneous Q24H   ezetimibe  5 mg Oral Daily   folic acid  1 mg Oral BID   guaiFENesin  600 mg Oral BID   HYDROcodone-acetaminophen  1 tablet Oral QHS   loratadine  10 mg Oral Daily   montelukast  10 mg Oral QHS   polyethylene glycol  17 g Oral Daily   pyridOXINE   100 mg Oral Daily   Ubrogepant   Oral Daily   vitamin B-12  2,500 mcg Oral Daily   Continuous Infusions:  azithromycin 500 mg (01/08/21 0808)   cefTRIAXone (ROCEPHIN)  IV 2 g (01/08/21 1610)    Assessment & Plan:   Principal Problem:   Postobstructive pneumonia Active Problems:   Sleep apnea   GERD (gastroesophageal reflux disease)   Malignant neoplasm of upper lobe of left lung (HCC)   Benign essential hypertension   Patient is a 75 year old female with a history of adenocarcinoma of the left lung admitted to the hospital for postobstructive pneumonia.       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  Still becomes dyspneic on exertion PCCM following I do not see RSV respiratory panel.  However influenza and COVID-negative Continue antibiotics I-S flutter valve Continue Mucinex, Mucomyst Hold on bronchoscopy for now and consider pulmonary toiletry and treating the infection first Continue CPAP at bedtime     Sleep apnea Continue CPAP at bedtime           History of lung cancer Patient has a history of adenocarcinoma of the left lung and is status post chemo and radiation therapy as well as immunotherapy.  if has recurrent symptoms oncology will obtain PET/CT followed by consideration for bronc.  However she would like to avoid PET scan in the presence of acute infection.   10/29 bronchoscopy on hold currently as above     DVT prophylaxis: Lovenox Code Status: DNR Family Communication: None at bedside Disposition Plan:  Home Status is: Inpatient  Remains inpatient appropriate because: Receiving IV treatments for severity of her illness requiring hospitalization.  Quite symptomatic and short of breath.   Possible dc over the weekned if continues to improve          LOS: 3 days   Time spent: 35 minutes with more than 50% on [place], MD [facility] Pager [phone]  If 7PM-7AM, please contact night-coverage 01/09/2021, 8:38 AM

## 2021-01-09 NOTE — Plan of Care (Signed)
Continuing with plan of care. 

## 2021-01-09 NOTE — Progress Notes (Signed)
Mobility Specialist - Progress Note   01/09/21 1300  Mobility  Activity Ambulated in hall  Level of Assistance Standby assist, set-up cues, supervision of patient - no hands on  Assistive Device Front wheel walker  Distance Ambulated (ft) 180 ft  Mobility Ambulated with assistance in hallway  Mobility Response Tolerated well  Mobility performed by Mobility specialist  $Mobility charge 1 Mobility    Pre-mobility: 94 HR, 99% SpO2 During mobility: 104 HR, 90-92%SpO2 Post-mobility: 94 HR, 95% SpO2   Pt lying in bed utilizing 3L. Weaned down to 2L for ambulation with sats remaining > 90%. Mild SOB. Returned to room and sats > 94% at rest. Pt left on 2L with RN notified.    Kathee Delton Mobility Specialist 01/09/21, 1:30 PM

## 2021-01-10 DIAGNOSIS — J189 Pneumonia, unspecified organism: Secondary | ICD-10-CM | POA: Diagnosis not present

## 2021-01-10 MED ORDER — IPRATROPIUM-ALBUTEROL 0.5-2.5 (3) MG/3ML IN SOLN
3.0000 mL | Freq: Three times a day (TID) | RESPIRATORY_TRACT | Status: DC
  Administered 2021-01-11 – 2021-01-12 (×4): 3 mL via RESPIRATORY_TRACT
  Filled 2021-01-10 (×5): qty 3

## 2021-01-10 MED ORDER — ACETYLCYSTEINE 20 % IN SOLN
2.0000 mL | Freq: Two times a day (BID) | RESPIRATORY_TRACT | Status: DC
  Administered 2021-01-10 – 2021-01-12 (×5): 2 mL via RESPIRATORY_TRACT
  Filled 2021-01-10 (×6): qty 4

## 2021-01-10 MED ORDER — ACETAMINOPHEN 325 MG PO TABS
650.0000 mg | ORAL_TABLET | Freq: Four times a day (QID) | ORAL | Status: DC | PRN
  Administered 2021-01-10 – 2021-01-11 (×2): 650 mg via ORAL
  Filled 2021-01-10 (×2): qty 2

## 2021-01-10 MED ORDER — IPRATROPIUM-ALBUTEROL 0.5-2.5 (3) MG/3ML IN SOLN
3.0000 mL | Freq: Four times a day (QID) | RESPIRATORY_TRACT | Status: DC
  Administered 2021-01-10 (×2): 3 mL via RESPIRATORY_TRACT
  Filled 2021-01-10 (×2): qty 3

## 2021-01-10 NOTE — Plan of Care (Signed)
Continuing with plan of care. 

## 2021-01-10 NOTE — Progress Notes (Signed)
PROGRESS NOTE    Jennifer Hodges  WGN:[id number] DOB: 05-Jul-1945 DOA: 01/06/2021 PCP: [name], MD    Brief Narrative:  Patient is a 75 year old female with a history of adenocarcinoma of the lung status post chemo and radiation therapy as well as immunotherapy who presents to the ER for evaluation of a 3-week history of worsening shortness of breath mostly with exertion associated with a cough productive of yellowish-green phlegm.  Patient has completed 2 courses of antibiotic therapy as an outpatient without improvement in her symptoms. Imaging shows postobstructive pneumonia 10/29 improving clinically.  Becomes dyspneic with short distance of walking during my observation.  10/30 feels short of breath with exertion.  Some cough.  No chest pain. O2 sat between 87 to 92% on 2 L Pain, no dizziness Consultants:  Oncology, pulmonology  Procedures:   Antimicrobials:  Ceftriaxone and azithromycin   Subjective: No abdominal pain, nausea, vomiting  Objective: Vitals:   01/10/21 0509 01/10/21 0510 01/10/21 0743 01/10/21 0755  BP: 108/65 108/65 117/66   Pulse: 74 76 79 75  Resp: 18 18 18 18   Temp: 98.5 F (36.9 C) 98.5 F (36.9 C) 98.4 F (36.9 C)   TempSrc: Oral Oral Oral   SpO2: 97% 95% 96% 97%  Weight:      Height:        Intake/Output Summary (Last 24 hours) at 01/10/2021 0841 Last data filed at 01/10/2021 0557 Gross per 24 hour  Intake 798.78 ml  Output 500 ml  Net 298.78 ml   Filed Weights   01/06/21 0636 01/08/21 2131  Weight: 76 kg 76 kg    Examination: Nad, calm Rhonchi b/l, less than before. No wheezing Regular s1/s2 no gallop Soft benign +bs No edema aaoxox3  Data Reviewed: I have personally reviewed following labs and imaging studies  CBC: Recent Labs  Lab 01/05/21 1830 01/07/21 0706  WBC 7.4 7.2  NEUTROABS 4.7  --   HGB 13.7 13.2  HCT 39.8 38.1  MCV 93.0 92.9  PLT 246 784   Basic Metabolic Panel: Recent Labs  Lab  01/05/21 1830 01/07/21 0706  NA 139 136  K 3.9 4.1  CL 103 98  CO2 28 31  GLUCOSE 130* 111*  BUN 7* 9  CREATININE 0.37* 0.55  CALCIUM 9.0 9.2   GFR: Estimated Creatinine Clearance: 55.8 mL/min (by C-G formula based on SCr of 0.55 mg/dL). Liver Function Tests: Recent Labs  Lab 01/05/21 1830  AST 19  ALT 15  ALKPHOS 76  BILITOT 0.8  PROT 7.2  ALBUMIN 4.1   No results for input(s): LIPASE, AMYLASE in the last 168 hours. No results for input(s): AMMONIA in the last 168 hours. Coagulation Profile: No results for input(s): INR, PROTIME in the last 168 hours. Cardiac Enzymes: No results for input(s): CKTOTAL, CKMB, CKMBINDEX, TROPONINI in the last 168 hours. BNP (last 3 results) No results for input(s): PROBNP in the last 8760 hours. HbA1C: No results for input(s): HGBA1C in the last 72 hours. CBG: No results for input(s): GLUCAP in the last 168 hours. Lipid Profile: No results for input(s): CHOL, HDL, LDLCALC, TRIG, CHOLHDL, LDLDIRECT in the last 72 hours. Thyroid Function Tests: No results for input(s): TSH, T4TOTAL, FREET4, T3FREE, THYROIDAB in the last 72 hours. Anemia Panel: No results for input(s): VITAMINB12, FOLATE, FERRITIN, TIBC, IRON, RETICCTPCT in the last 72 hours. Sepsis Labs: No results for input(s): PROCALCITON, LATICACIDVEN in the last 168 hours.  Recent Results (from the past 240 hour(s))  Resp Panel  by RT-PCR (Flu A&B, Covid) Nasopharyngeal Swab     Status: None   Collection Time: 01/05/21  8:33 PM   Specimen: Nasopharyngeal Swab; Nasopharyngeal(NP) swabs in vial transport medium  Result Value Ref Range Status   SARS Coronavirus 2 by RT PCR NEGATIVE NEGATIVE Final    Comment: (NOTE) SARS-CoV-2 target nucleic acids are NOT DETECTED.  The SARS-CoV-2 RNA is generally detectable in upper respiratory specimens during the acute phase of infection. The lowest concentration of SARS-CoV-2 viral copies this assay can detect is 138 copies/mL. A negative  result does not preclude SARS-Cov-2 infection and should not be used as the sole basis for treatment or other patient management decisions. A negative result may occur with  improper specimen collection/handling, submission of specimen other than nasopharyngeal swab, presence of viral mutation(s) within the areas targeted by this assay, and inadequate number of viral copies(<138 copies/mL). A negative result must be combined with clinical observations, patient history, and epidemiological information. The expected result is Negative.  Fact Sheet for Patients:  EntrepreneurPulse.com.au  Fact Sheet for Healthcare Providers:  IncredibleEmployment.be  This test is no t yet approved or cleared by the [place] FDA and  has been authorized for detection and/or diagnosis of SARS-CoV-2 by FDA under an Emergency Use Authorization (EUA). This EUA will remain  in effect (meaning this test can be used) for the duration of the COVID-19 declaration under Section 564(b)(1) of the Act, 21 U.S.C.section 360bbb-3(b)(1), unless the authorization is terminated  or revoked sooner.       Influenza A by PCR NEGATIVE NEGATIVE Final   Influenza B by PCR NEGATIVE NEGATIVE Final    Comment: (NOTE) The Xpert Xpress SARS-CoV-2/FLU/RSV plus assay is intended as an aid in the diagnosis of influenza from Nasopharyngeal swab specimens and should not be used as a sole basis for treatment. Nasal washings and aspirates are unacceptable for Xpert Xpress SARS-CoV-2/FLU/RSV testing.  Fact Sheet for Patients: EntrepreneurPulse.com.au  Fact Sheet for Healthcare Providers: IncredibleEmployment.be  This test is not yet approved or cleared by the [place] FDA and has been authorized for detection and/or diagnosis of SARS-CoV-2 by FDA under an Emergency Use Authorization (EUA). This EUA will remain in effect (meaning this test can be used)  for the duration of the COVID-19 declaration under Section 564(b)(1) of the Act, 21 U.S.C. section 360bbb-3(b)(1), unless the authorization is terminated or revoked.  Performed at [facility], [address] [place], [place] [address]   Expectorated Sputum Assessment w Gram Stain, Rflx to Resp Cult     Status: None   Collection Time: 01/06/21  9:36 PM   Specimen: Expectorated Sputum  Result Value Ref Range Status   Specimen Description EXPECTORATED SPUTUM  Final   Special Requests NONE  Final   Sputum evaluation   Final    THIS SPECIMEN IS ACCEPTABLE FOR SPUTUM CULTURE Performed at [facility], [address], [place], [place] [address]    Report Status 01/06/2021 FINAL  Final  Culture, Respiratory w Gram Stain     Status: None   Collection Time: 01/06/21  9:36 PM  Result Value Ref Range Status   Specimen Description   Final    EXPECTORATED SPUTUM Performed at [facility], [address], [place], [place] [address]    Special Requests   Final    NONE Reflexed from [id number] Performed at [facility], [address], [place],  [address]    Gram Stain   Final  RARE SQUAMOUS EPITHELIAL CELLS PRESENT NO WBC SEEN RARE GRAM POSITIVE COCCI    Culture   Final    RARE Normal respiratory flora-no Staph aureus or Pseudomonas seen Performed at [facility], [place] [address] [place] [address], [place], [place] [address]    Report Status 01/09/2021 FINAL  Final         Radiology Studies: No results found.      Scheduled Meds:  acetylcysteine  2 mL Nebulization BID   benzonatate  200 mg Oral TID   Chlorhexidine Gluconate Cloth  6 each Topical Daily   cholecalciferol  4,000 Units Oral Daily   cyclobenzaprine  10 mg Oral QHS   enoxaparin (LOVENOX) injection  40 mg Subcutaneous Q24H   ezetimibe  5 mg Oral Daily   folic acid  1 mg Oral BID   guaiFENesin  600 mg Oral BID   HYDROcodone-acetaminophen  1 tablet Oral QHS   loratadine  10 mg  Oral Daily   montelukast  10 mg Oral QHS   polyethylene glycol  17 g Oral Daily   pyridOXINE  100 mg Oral Daily   Ubrogepant   Oral Daily   vitamin B-12  2,500 mcg Oral Daily   Continuous Infusions:  azithromycin Stopped (01/09/21 1114)   cefTRIAXone (ROCEPHIN)  IV Stopped (01/09/21 1454)    Assessment & Plan:   Principal Problem:   Postobstructive pneumonia Active Problems:   Sleep apnea   GERD (gastroesophageal reflux disease)   Malignant neoplasm of upper lobe of left lung (HCC)   Benign essential hypertension   Patient is a 75 year old female with a history of adenocarcinoma of the left lung admitted to the hospital for postobstructive pneumonia.       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  Still becomes dyspneic on exertion PCCM following I do not see RSV respiratory panel.  However influenza and  covid neg.COVID-negative 10/30 continue antibiotics Continue Mucinex, Mucomyst Hold on bronchoscopy for now and consider pulmonary culture and treating the infection first Can follow-up as outpatient with pulmonologist and decision about bronchoscopy and all further management can be decided I-S and flutter valve May need oxygen on discharge  Sleep apnea Continue CPAP at bedtime         History of lung cancer Patient has a history of adenocarcinoma of the left lung and is status post chemo and radiation therapy as well as immunotherapy.  if has recurrent symptoms oncology will obtain PET/CT followed by consideration for bronc.   However she would like to avoid PET scan in the presence of acute infection.   10/30 bronchoscopy on hold.   Follow-up with oncology as outpatient      DVT prophylaxis: Lovenox Code Status: DNR Family Communication: None at bedside Disposition Plan: Home Status is: Inpatient  Remains inpatient appropriate because: Receiving IV treatments for severity of her illness requiring hospitalization.  Quite symptomatic and short of breath.   Possible DC in 1 to 2 days if clinically improved.         LOS: 4 days   Time spent: 35 minutes with more than 50% on [place],  MD [facility] Pager [phone]  If 7PM-7AM, please contact night-coverage 01/10/2021, 8:41 AM

## 2021-01-11 DIAGNOSIS — J189 Pneumonia, unspecified organism: Secondary | ICD-10-CM | POA: Diagnosis not present

## 2021-01-11 MED ORDER — PREDNISONE 20 MG PO TABS
20.0000 mg | ORAL_TABLET | Freq: Every day | ORAL | Status: DC
  Administered 2021-01-12: 20 mg via ORAL
  Filled 2021-01-11: qty 1

## 2021-01-11 MED ORDER — METHYLPREDNISOLONE SODIUM SUCC 40 MG IJ SOLR
40.0000 mg | Freq: Once | INTRAMUSCULAR | Status: AC
  Administered 2021-01-11: 40 mg via INTRAVENOUS
  Filled 2021-01-11: qty 1

## 2021-01-11 NOTE — Progress Notes (Addendum)
PROGRESS NOTE    Jennifer Hodges  SJG:[id number] DOB: May 16, 1945 DOA: 01/06/2021 PCP: [name], MD    Brief Narrative:  Patient is a 75 year old female with a history of adenocarcinoma of the lung status post chemo and radiation therapy as well as immunotherapy who presents to the ER for evaluation of a 3-week history of worsening shortness of breath mostly with exertion associated with a cough productive of yellowish-green phlegm.  Patient has completed 2 courses of antibiotic therapy as an outpatient without improvement in her symptoms. Imaging shows postobstructive pneumonia 10/29 improving clinically.  Becomes dyspneic with short distance of walking during my observation.  10/30 feels short of breath with exertion.  Some cough.  No chest pain. 10/31 feels winded just walking to the bathroom. Still sob but not as bad as admission, but not at baseline    Consultants:  Oncology, pulmonology  Procedures:   Antimicrobials:  Ceftriaxone and azithromycin   Subjective: No cp, dizziness, or abd pain  Objective: Vitals:   01/10/21 2025 01/10/21 2100 01/11/21 0442 01/11/21 0817  BP:  124/66 122/66 122/61  Pulse:  87 76 73  Resp:   20 20  Temp:   98.8 F (37.1 C) 97.8 F (36.6 C)  TempSrc:   Oral Oral  SpO2: 96%  100% 96%  Weight:      Height:        Intake/Output Summary (Last 24 hours) at 01/11/2021 0844 Last data filed at 01/11/2021 0517 Gross per 24 hour  Intake --  Output 0 ml  Net 0 ml   Filed Weights   01/06/21 0636 01/08/21 2131  Weight: 76 kg 76 kg    Examination: Nad, calm Exp. Wheezing with mild rhonchi Regular s1/s2 no gallop Soft benign +bs No edema aaoxox3  Data Reviewed: I have personally reviewed following labs and imaging studies  CBC: Recent Labs  Lab 01/05/21 1830 01/07/21 0706  WBC 7.4 7.2  NEUTROABS 4.7  --   HGB 13.7 13.2  HCT 39.8 38.1  MCV 93.0 92.9  PLT 246 654   Basic Metabolic Panel: Recent Labs  Lab  01/05/21 1830 01/07/21 0706  NA 139 136  K 3.9 4.1  CL 103 98  CO2 28 31  GLUCOSE 130* 111*  BUN 7* 9  CREATININE 0.37* 0.55  CALCIUM 9.0 9.2   GFR: Estimated Creatinine Clearance: 55.8 mL/min (by C-G formula based on SCr of 0.55 mg/dL). Liver Function Tests: Recent Labs  Lab 01/05/21 1830  AST 19  ALT 15  ALKPHOS 76  BILITOT 0.8  PROT 7.2  ALBUMIN 4.1   No results for input(s): LIPASE, AMYLASE in the last 168 hours. No results for input(s): AMMONIA in the last 168 hours. Coagulation Profile: No results for input(s): INR, PROTIME in the last 168 hours. Cardiac Enzymes: No results for input(s): CKTOTAL, CKMB, CKMBINDEX, TROPONINI in the last 168 hours. BNP (last 3 results) No results for input(s): PROBNP in the last 8760 hours. HbA1C: No results for input(s): HGBA1C in the last 72 hours. CBG: No results for input(s): GLUCAP in the last 168 hours. Lipid Profile: No results for input(s): CHOL, HDL, LDLCALC, TRIG, CHOLHDL, LDLDIRECT in the last 72 hours. Thyroid Function Tests: No results for input(s): TSH, T4TOTAL, FREET4, T3FREE, THYROIDAB in the last 72 hours. Anemia Panel: No results for input(s): VITAMINB12, FOLATE, FERRITIN, TIBC, IRON, RETICCTPCT in the last 72 hours. Sepsis Labs: No results for input(s): PROCALCITON, LATICACIDVEN in the last 168 hours.  Recent Results (from the  past 240 hour(s))  Resp Panel by RT-PCR (Flu A&B, Covid) Nasopharyngeal Swab     Status: None   Collection Time: 01/05/21  8:33 PM   Specimen: Nasopharyngeal Swab; Nasopharyngeal(NP) swabs in vial transport medium  Result Value Ref Range Status   SARS Coronavirus 2 by RT PCR NEGATIVE NEGATIVE Final    Comment: (NOTE) SARS-CoV-2 target nucleic acids are NOT DETECTED.  The SARS-CoV-2 RNA is generally detectable in upper respiratory specimens during the acute phase of infection. The lowest concentration of SARS-CoV-2 viral copies this assay can detect is 138 copies/mL. A negative  result does not preclude SARS-Cov-2 infection and should not be used as the sole basis for treatment or other patient management decisions. A negative result may occur with  improper specimen collection/handling, submission of specimen other than nasopharyngeal swab, presence of viral mutation(s) within the areas targeted by this assay, and inadequate number of viral copies(<138 copies/mL). A negative result must be combined with clinical observations, patient history, and epidemiological information. The expected result is Negative.  Fact Sheet for Patients:  EntrepreneurPulse.com.au  Fact Sheet for Healthcare Providers:  IncredibleEmployment.be  This test is no t yet approved or cleared by the [place] FDA and  has been authorized for detection and/or diagnosis of SARS-CoV-2 by FDA under an Emergency Use Authorization (EUA). This EUA will remain  in effect (meaning this test can be used) for the duration of the COVID-19 declaration under Section 564(b)(1) of the Act, 21 U.S.C.section 360bbb-3(b)(1), unless the authorization is terminated  or revoked sooner.       Influenza A by PCR NEGATIVE NEGATIVE Final   Influenza B by PCR NEGATIVE NEGATIVE Final    Comment: (NOTE) The Xpert Xpress SARS-CoV-2/FLU/RSV plus assay is intended as an aid in the diagnosis of influenza from Nasopharyngeal swab specimens and should not be used as a sole basis for treatment. Nasal washings and aspirates are unacceptable for Xpert Xpress SARS-CoV-2/FLU/RSV testing.  Fact Sheet for Patients: EntrepreneurPulse.com.au  Fact Sheet for Healthcare Providers: IncredibleEmployment.be  This test is not yet approved or cleared by the [place] FDA and has been authorized for detection and/or diagnosis of SARS-CoV-2 by FDA under an Emergency Use Authorization (EUA). This EUA will remain in effect (meaning this test can be used)  for the duration of the COVID-19 declaration under Section 564(b)(1) of the Act, 21 U.S.C. section 360bbb-3(b)(1), unless the authorization is terminated or revoked.  Performed at [facility], [address] [place], [place] [address]   Expectorated Sputum Assessment w Gram Stain, Rflx to Resp Cult     Status: None   Collection Time: 01/06/21  9:36 PM   Specimen: Expectorated Sputum  Result Value Ref Range Status   Specimen Description EXPECTORATED SPUTUM  Final   Special Requests NONE  Final   Sputum evaluation   Final    THIS SPECIMEN IS ACCEPTABLE FOR SPUTUM CULTURE Performed at [facility], [address] [place], [place] [address]    Report Status 01/06/2021 FINAL  Final  Culture, Respiratory w Gram Stain     Status: None   Collection Time: 01/06/21  9:36 PM  Result Value Ref Range Status   Specimen Description   Final    EXPECTORATED SPUTUM Performed at [facility], [address], [place], [place] [address]    Special Requests   Final    NONE Reflexed from [id number] Performed at [facility], [address] [place], [place] [address]    Gram Stain  Final    RARE SQUAMOUS EPITHELIAL CELLS PRESENT NO WBC SEEN RARE GRAM POSITIVE COCCI    Culture   Final    RARE Normal respiratory flora-no Staph aureus or Pseudomonas seen Performed at [facility], [address], [place], [place] [address]    Report Status 01/09/2021 FINAL  Final         Radiology Studies: No results found.      Scheduled Meds:  acetylcysteine  2 mL Nebulization BID   benzonatate  200 mg Oral TID   Chlorhexidine Gluconate Cloth  6 each Topical Daily   cholecalciferol  4,000 Units Oral Daily   cyclobenzaprine  10 mg Oral QHS   enoxaparin (LOVENOX) injection  40 mg Subcutaneous Q24H   ezetimibe  5 mg Oral Daily   folic acid  1 mg Oral BID   guaiFENesin  600 mg Oral BID   HYDROcodone-acetaminophen  1 tablet Oral QHS    ipratropium-albuterol  3 mL Nebulization TID   loratadine  10 mg Oral Daily   montelukast  10 mg Oral QHS   polyethylene glycol  17 g Oral Daily   pyridOXINE  100 mg Oral Daily   Ubrogepant   Oral Daily   vitamin B-12  2,500 mcg Oral Daily   Continuous Infusions:    Assessment & Plan:   Principal Problem:   Postobstructive pneumonia Active Problems:   Sleep apnea   GERD (gastroesophageal reflux disease)   Malignant neoplasm of upper lobe of left lung (HCC)   Benign essential hypertension   Patient is a 75 year old female with a history of adenocarcinoma of the left lung admitted to the hospital for postobstructive pneumonia.       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  Still becomes dyspneic on exertion I do not see RSV respiratory panel.  However influenza and  covid neg.COVID-negative Hold on bronchoscopy for now and consider pulmonary culture and treating the infection first Can follow-up as outpatient with pulmonologist and decision about bronchoscopy and all further management can be decided I-S and flutter valve 10/31 still not at baseline  continue abx Continue mucinex, mucomyst Will f/u with pccm- spoke to Dr. [name] about initiation of steroids, he was okay and recommended 20 mg daily until he is seen in the office on Friday upon discharge.  We will give 1 dose of IV methylprednisone 40 and start 20 tomorrow.     Sleep apnea Continue CPAP at bedtime         History of lung cancer Patient has a history of adenocarcinoma  of the left lung and is status post chemo and radiation therapy as well as immunotherapy.  if has recurrent symptoms oncology will obtain PET/CT followed by consideration for bronc.  However she would like to avoid PET scan in the presence of acute infection.   1031 bronchoscopy on hold Follow-up oncology as outpatient     DVT prophylaxis: Lovenox Code Status: DNR Family Communication: None at bedside Disposition Plan: Home Status is: Inpatient  Remains inpatient appropriate because: Receiving IV treatments for severity of her illness requiring hospitalization.  Quite symptomatic and short of  breath.   Possible DC in 1 to 2 days if clinically improved.         LOS: 5 days   Time spent: 35 minutes with more than 50% on McConnell, MD [facility] Pager [phone]  If 7PM-7AM, please contact night-coverage 01/11/2021, 8:44 AM

## 2021-01-11 NOTE — Evaluation (Addendum)
Physical Therapy Evaluation Patient Details Name: KAMBREA CARRASCO MRN: [id number] DOB: Aug 22, 1945 Today's Date: 01/11/2021  History of Present Illness  Patient is a 75 year old female with a history of adenocarcinoma of the left lung admitted to the hospital for postobstructive pneumonia. Past medical history includes adenocarcinoma of the lung status post chemo and radiation therapy.  Clinical Impression  Patient agreeable to PT. Patient reports she lives with her spouse and ambulates with a can or a four wheeled walker at baseline.  Currently, patient is independent with bed mobility and transfers. She ambulated in the hallway with rolling walker, Mod I on 2 L02 with Sp02 94-100%. Patient reports mild shortness of breath towards the end of walking bout, but carries on a conversation without difficulty, that subsides with rest. Patient educated on energy conservation, pursed lip breathing techniques, walking for conditioning and building activity slowly. No anticipated PT needs at this time as patient likely at or nearing her baseline level of functional mobility.     Recommendations for follow up therapy are one component of a multi-disciplinary discharge planning process, led by the attending physician.  Recommendations may be updated based on patient status, additional functional criteria and insurance authorization.  Follow Up Recommendations No PT follow up    Assistance Recommended at Discharge None  Functional Status Assessment Patient has not had a recent decline in their functional status  Equipment Recommendations  None recommended by PT    Recommendations for Other Services       Precautions / Restrictions Precautions Precautions: Fall Restrictions Weight Bearing Restrictions: No      Mobility  Bed Mobility Overal bed mobility: Independent                  Transfers Overall transfer level: Independent                       Ambulation/Gait Ambulation/Gait assistance: Modified independent (Device/Increase time) Gait Distance (Feet): 170 Feet Assistive device: Rolling walker (2 wheels) Gait Pattern/deviations: Step-through pattern Gait velocity: mildly decreased   General Gait Details: patient ambulated  on 2 L02 with Sp02 ranging from 94%-100%. Patient used rolling walker for hallway ambulation without loss of balance. Patient reports mild shortness of breath with mobility, however is able to carry a conversation without difficulty. Patient educated on progressing activity slowly, walking for endurance, using rollator with seat for rest breaks with longer distance ambulation at home, and pursed lip breathing techniques to be used as needed if feeling short of breath. patient also ambulated a short distance in the room without rolling walker and without loss of balance (base of support was more narrow that with device).  Stairs            Wheelchair Mobility    Modified Rankin (Stroke Patients Only)       Balance Overall balance assessment: Modified Independent Sitting-balance support: No upper extremity supported Sitting balance-Leahy Scale: Normal     Standing balance support: No upper extremity supported;During functional activity Standing balance-Leahy Scale: Good Standing balance comment: patient able to reach outside base of support with bilateral UE without loss of balance                             Pertinent Vitals/Pain Pain Assessment: Faces Faces Pain Scale: Hurts a little bit Pain Location: chronic back pain Pain Descriptors / Indicators: Constant Pain Intervention(s): Limited activity within patient's tolerance;Monitored during session  Home Living Family/patient expects to be discharged to:: Private residence Living Arrangements: Spouse/significant other Available Help at Discharge: Family Type of Home: [place]: Rollator (4  wheels);Cane - single point;BSC Additional Comments: CPAP at night    Prior Function Prior Level of Function : Independent/Modified Independent             Mobility Comments: patient uses a cane or four wheeled walker for ambulation. no falls reported ADLs Comments: independent     Hand Dominance        Extremity/Trunk Assessment   Upper Extremity Assessment Upper Extremity Assessment: Overall WFL for tasks assessed    Lower Extremity Assessment Lower Extremity Assessment: Overall WFL for tasks assessed       Communication   Communication: No difficulties  Cognition Arousal/Alertness: Awake/alert Behavior During Therapy: WFL for tasks assessed/performed Overall Cognitive Status: Within Functional Limits for tasks assessed                                          General Comments      Exercises     Assessment/Plan    PT Assessment Patient does not need any further PT services  PT Problem List         PT Treatment Interventions      PT Goals (Current goals can be found in the Care Plan section)  Acute Rehab PT Goals Patient Stated Goal: to go home to prepare for Thanksgiving with family PT Goal Formulation: With patient Time For Goal Achievement: 01/11/21 Potential to Achieve Goals: Good    Frequency     Barriers to discharge        Co-evaluation               AM-PAC PT "6 Clicks" Mobility  Outcome Measure Help needed turning from your back to your side while in a flat bed without using bedrails?: None Help needed moving from lying on your back to sitting on the side of a flat bed without using bedrails?: None Help needed moving to and from a bed to a chair (including a wheelchair)?: None Help needed standing up from a chair using your arms (e.g., wheelchair or bedside chair)?: None Help needed to walk in hospital room?: None Help needed climbing 3-5 steps with a railing? : None 6 Click Score: 24    End of Session  Equipment Utilized During Treatment: Oxygen Activity Tolerance: Patient tolerated treatment well Patient left: in bed;with call bell/phone within reach   PT Visit Diagnosis: Muscle weakness (generalized) (M62.81)    Time: 0076-2263 PT Time Calculation (min) (ACUTE ONLY): 17 min   Charges:   PT Evaluation $PT Eval Low Complexity: 1 Low PT Treatments $Therapeutic Activity: 8-22 mins        Minna Merritts, PT, MPT   Percell Locus 01/11/2021, 9:12 AM

## 2021-01-11 NOTE — Care Management Important Message (Signed)
Important Message  Patient Details  Name: Jennifer Hodges MRN: [id number] Date of Birth: 1945/06/17   Medicare Important Message Given:  Yes     Dannette Barbara 01/11/2021, 1:32 PM

## 2021-01-12 DIAGNOSIS — J189 Pneumonia, unspecified organism: Secondary | ICD-10-CM | POA: Diagnosis not present

## 2021-01-12 MED ORDER — BENZONATATE 200 MG PO CAPS: 200.0000 mg | ORAL_CAPSULE | Freq: Two times a day (BID) | ORAL | 0 refills | Status: AC | PRN

## 2021-01-12 MED ORDER — PANTOPRAZOLE SODIUM 20 MG PO TBEC: 20.0000 mg | DELAYED_RELEASE_TABLET | Freq: Every day | ORAL | 0 refills | Status: DC

## 2021-01-12 MED ORDER — PANTOPRAZOLE SODIUM 20 MG PO TBEC
20.0000 mg | DELAYED_RELEASE_TABLET | Freq: Every day | ORAL | Status: DC
  Filled 2021-01-12: qty 1

## 2021-01-12 MED ORDER — PREDNISONE 20 MG PO TABS: 20.0000 mg | ORAL_TABLET | Freq: Every day | ORAL | 0 refills | Status: AC

## 2021-01-12 MED ORDER — GUAIFENESIN ER 600 MG PO TB12: 600.0000 mg | ORAL_TABLET | Freq: Two times a day (BID) | ORAL | 0 refills | Status: AC

## 2021-01-12 NOTE — Final Progress Note (Signed)
Pulmonary Medicine             HISTORY OF PRESENT ILLNESS   Doing much better today appreciate the Hospitalists' help. Stable to go home. Off o2, no coughing, less wheezing. Stronger.   PAST MEDICAL HISTORY   Past Medical History:  Diagnosis Date   Anemia    Asthma    No Inhalers--Dr. [name] will order as needed   Bronchiectasis ([place])    mild   Chronic headaches     followed by [facility] migraines   COPD (chronic obstructive pulmonary disease) (HCC)    DDD (degenerative disc disease), lumbar    Diverticulosis    Family history of adverse reaction to anesthesia    PONV   Gall stones    history of   GERD (gastroesophageal reflux disease)    EGD 8/09- non bleeding erosive gastritis, documentd esophageal ulcerations.    Hiatal hernia    History of pneumonia    Hypercholesterolemia    IBS (irritable bowel syndrome)    Malignant neoplasm of upper lobe of left lung ([place]) 12/27/2018   Meniere disease    Murmur    Osteoarthritis    lumbar disc disease, left hip   Personal history of chemotherapy    Personal history of radiation therapy    PONV (postoperative nausea and vomiting)    "Only with last hip and I believe it was due to the morphine"    Sleep apnea    cpap asked to bring mask and tubing   Vertigo    Weakness of right side of body      SURGICAL HISTORY   Past Surgical History:  Procedure Laterality Date   ABDOMINAL HYSTERECTOMY  age 75   ANTERIOR CERVICAL DECOMP/DISCECTOMY FUSION N/A 02/24/2015   Procedure: CERVICAL FOUR-FIVE, CERVICAL FIVE-SIX, CERVICAL SIX-SEVEN ANTERIOR CERVICAL DECOMPRESSION/DISCECTOMY FUSION ;  Surgeon: [name], MD;  Location: [facility];  Service: Neurosurgery;  Laterality: N/A;  C45 C56 C67 anterior cervical decompression with fusion interbody prosthesis plating and bonegraft   APPENDECTOMY     BACK SURGERY     4th lumbar fusion   BLADDER SURGERY N/A    with vaginal wall repair   BREAST CYST  ASPIRATION Bilateral    neg   BREAST SURGERY Bilateral    cyst removed and reduction   CARDIAC CATHETERIZATION  2014   CARPAL TUNNEL RELEASE Right 02/11/2016   Procedure: CARPAL TUNNEL RELEASE;  Surgeon: [name], MD;  Location: [facility];  Service: Orthopedics;  Laterality: Right;   CATARACT EXTRACTION W/ INTRAOCULAR LENS IMPLANT Bilateral 2015   CHOLECYSTECTOMY     EUS N/A 05/31/2012   Procedure: UPPER ENDOSCOPIC ULTRASOUND (EUS) LINEAR;  Surgeon: [name], MD;  Location: [facility];  Service: Endoscopy;  Laterality: N/A;   EXCISIONAL HEMORRHOIDECTOMY     JOINT REPLACEMENT Bilateral    KNEE ARTHROSCOPY WITH LATERAL MENISECTOMY Right 07/07/2015   Procedure: KNEE ARTHROSCOPY WITH LATERAL MENISECTOMY, PARTIAL SYNOVECTOMY;  Surgeon: [name], MD;  Location: [facility];  Service: Orthopedics;  Laterality: Right;   LUMBAR LAMINECTOMY     PORTA CATH INSERTION N/A 01/07/2019   Procedure: PORTA CATH INSERTION;  Surgeon: [name], MD;  Location: [facility];  Service: Cardiovascular;  Laterality: N/A;   REDUCTION MAMMAPLASTY  1990   RIGHT OOPHORECTOMY     TOTAL HIP ARTHROPLASTY Left 05/01/2014   Dr. [name]   TOTAL HIP ARTHROPLASTY Right 08/05/2014   Procedure: TOTAL HIP ARTHROPLASTY ANTERIOR APPROACH;  Surgeon: [name], MD;  Location: [facility];  Service: Orthopedics;  Laterality: Right;   ULNAR NERVE TRANSPOSITION Right 02/11/2016   Procedure: ULNAR NERVE DECOMPRESSION/TRANSPOSITION;  Surgeon: [name], MD;  Location: [facility];  Service: Orthopedics;  Laterality: Right;   VIDEO BRONCHOSCOPY WITH ENDOBRONCHIAL ULTRASOUND Left 12/19/2018   Procedure: VIDEO BRONCHOSCOPY WITH ENDOBRONCHIAL ULTRASOUND, LEFT, SLEEP APNEA;  Surgeon: [name], MD;  Location: [facility];  Service: Thoracic;  Laterality: Left;     FAMILY HISTORY   Family History  Problem Relation Age of Onset   Heart disease Mother        s/p stent   Hypertension Mother     Hypercholesterolemia Mother    Diabetes Father    Stomach cancer Other        uncle   Breast cancer Cousin    Breast cancer Paternal Aunt      SOCIAL HISTORY   Social History   Tobacco Use   Smoking status: Former    Types: Cigarettes    Quit date: 06/13/2007    Years since quitting: 13.5   Smokeless tobacco: Never  Vaping Use   Vaping Use: Never used  Substance Use Topics   Alcohol use: No    Alcohol/week: 0.0 standard drinks   Drug use: No     MEDICATIONS    Home Medication:  Current Outpatient Rx   Order #: [id number] Class: Normal   Order #: [id number] Class: Normal   Order #: [id number] Class: Normal   [START ON 01/13/2021] Order #: [id number] Class: Normal    Current Medication:  Current Facility-Administered Medications:    acetaminophen (TYLENOL) tablet 650 mg, 650 mg, Oral, Q6H PRN, Kurtis Bushman, Gwynneth Albright, MD, 650 mg at 01/11/21 2028   acetylcysteine (MUCOMYST) 20 % nebulizer / oral solution 2 mL, 2 mL, Nebulization, BID, Kurtis Bushman, Sahar, MD, 2 mL at 01/12/21 0727   albuterol (PROVENTIL) (2.5 MG/3ML) 0.083% nebulizer solution 2.5 mg, 2.5 mg, Nebulization, Q6H PRN, Agbata, Tochukwu, MD, 2.5 mg at 01/10/21 0753   benzonatate (TESSALON) capsule 200 mg, 200 mg, Oral, TID, Agbata, Tochukwu, MD, 200 mg at 01/12/21 0809   Chlorhexidine Gluconate Cloth 2 % PADS 6 each, 6 each, Topical, Daily, Nolberto Hanlon, MD, 6 each at 01/12/21 0810   chlorpheniramine-HYDROcodone (TUSSIONEX) 10-8 MG/5ML suspension 5 mL, 5 mL, Oral, Q12H PRN, Tamala Julian, Rondell A, MD, 5 mL at 01/08/21 2044   cholecalciferol (VITAMIN D) tablet 4,000 Units, 4,000 Units, Oral, Daily, Agbata, Tochukwu, MD, 4,000 Units at 01/12/21 0807   cyclobenzaprine (FLEXERIL) tablet 10 mg, 10 mg, Oral, QHS, Agbata, Tochukwu, MD, 10 mg at 01/11/21 2208   enoxaparin (LOVENOX) injection 40 mg, 40 mg, Subcutaneous, Q24H, Agbata, Tochukwu, MD, 40 mg at 01/11/21 2022   ezetimibe (ZETIA) tablet 5 mg, 5 mg, Oral, Daily, Agbata, Tochukwu, MD, 5 mg at  31/49/70 2637   folic acid (FOLVITE) tablet 1 mg, 1 mg, Oral, BID, Chinita Greenland A, RPH, 1 mg at 01/12/21 0806   guaiFENesin (MUCINEX) 12 hr tablet 600 mg, 600 mg, Oral, BID, Kurtis Bushman, Sahar, MD, 600 mg at 01/12/21 0807   HYDROcodone-acetaminophen (NORCO/VICODIN) 5-325 MG per tablet 1 tablet, 1 tablet, Oral, QHS, Amery, Sahar, MD, 1 tablet at 01/11/21 2210   ipratropium-albuterol (DUONEB) 0.5-2.5 (3) MG/3ML nebulizer solution 3 mL, 3 mL, Nebulization, TID, Kurtis Bushman, Sahar, MD, 3 mL at 01/12/21 0727   loratadine (CLARITIN) tablet 10 mg, 10 mg, Oral, Daily, Agbata, Tochukwu, MD, 10 mg at 01/12/21 0807   meclizine (ANTIVERT) tablet 25 mg, 25 mg, Oral, Q6H PRN,  Collier Bullock, MD   montelukast (SINGULAIR) tablet 10 mg, 10 mg, Oral, QHS, Agbata, Tochukwu, MD, 10 mg at 01/11/21 2209   pantoprazole (PROTONIX) EC tablet 20 mg, 20 mg, Oral, Daily, Kurtis Bushman, Sahar, MD   polyethylene glycol (MIRALAX / GLYCOLAX) packet 17 g, 17 g, Oral, Daily, Agbata, Tochukwu, MD, 17 g at 01/11/21 1028   predniSONE (DELTASONE) tablet 20 mg, 20 mg, Oral, Q breakfast, Kurtis Bushman, Sahar, MD, 20 mg at 01/12/21 0809   pyridOXINE (VITAMIN B-6) tablet 100 mg, 100 mg, Oral, Daily, Agbata, Tochukwu, MD, 100 mg at 01/12/21 0947   Ubrogepant TABS, , Oral, Daily, Agbata, Tochukwu, MD   vitamin B-12 (CYANOCOBALAMIN) tablet 2,500 mcg, 2,500 mcg, Oral, Daily, Agbata, Tochukwu, MD, 2,500 mcg at 01/12/21 0808    ALLERGIES   Aspirin, Celebrex [celecoxib], Morphine and related, Adhesive [tape], Clarithromycin, Codeine, Darvon [propoxyphene hcl], Demerol [meperidine], Flonase [fluticasone propionate], Simvastatin, and Talwin [pentazocine]     REVIEW OF SYSTEMS    Review of Systems:  Gen:  Denies  fever, sweats, chills weigh loss  HEENT: Denies blurred vision, double vision, ear pain, eye pain, hearing loss, nose bleeds, sore throat Cardiac:  No dizziness, chest pain or heaviness, chest tightness,edema Resp:   Denies cough or sputum  porduction, shortness of breath,wheezing, hemoptysis,  Gi: Denies swallowing difficulty, stomach pain, nausea or vomiting, diarrhea, constipation, bowel incontinence Gu:  Denies bladder incontinence, burning urine Ext:   Denies Joint pain, stiffness or swelling Skin: Denies  skin rash, easy bruising or bleeding or hives Endoc:  Denies polyuria, polydipsia , polyphagia or weight change Psych:   Denies depression, insomnia or hallucinations   Other:  All other systems negative   VS: BP 130/69   Pulse 88   Temp 97.6 F (36.4 C)   Resp 18   Ht 5\' 1"  (1.549 m)   Wt 76 kg   SpO2 94%   BMI 31.66 kg/m      PHYSICAL EXAM    GENERAL:NAD, no fevers, chills, no weakness no fatigue HEAD: Normocephalic, atraumatic.  EYES: Pupils equal, round, reactive to light. Extraocular muscles intact. No scleral icterus.  MOUTH: Moist mucosal membrane. Dentition intact. No abscess noted.  EAR, NOSE, THROAT: Clear without exudates. No external lesions.  NECK: Supple. No thyromegaly. No nodules. No JVD.  PULMONARY: moving air better CARDIOVASCULAR: S1 and S2. Regular rate and rhythm. No murmurs, rubs, or gallops. No edema. Pedal pulses 2+ bilaterally.  GASTROINTESTINAL: Soft, nontender, nondistended. No masses. Positive bowel sounds. No hepatosplenomegaly.  MUSCULOSKELETAL: No swelling, clubbing, or edema. Range of motion full in all extremities.  NEUROLOGIC: Cranial nerves II through XII are intact. No gross focal neurological deficits. Sensation intact. Reflexes intact.  SKIN: No ulceration, lesions, rashes, or cyanosis. Skin warm and dry. Turgor intact.  PSYCHIATRIC: Mood, affect within normal limits. The patient is awake, alert and oriented x 3. Insight, judgment intact.       IMAGING    DG Chest 2 View  Result Date: 01/05/2021 CLINICAL DATA:  Shortness of breath EXAM: CHEST - 2 VIEW COMPARISON:  01/04/2021 FINDINGS: Right Port-A-Cath remains in place, unchanged. Heart is normal size.  Chronic left suprahilar/apical scarring, stable. Continued left apical airspace opacity, similar to prior study. Right lung is clear. No acute confluent opacities or effusions. No acute bony abnormality. IMPRESSION: Stable chronic changes. Continued left apical airspace opacity which could reflect pneumonia or recurrent malignancy. Findings unchanged since prior study. Electronically Signed   By: Rolm Baptise M.D.   On: 01/05/2021 18:32  DG Chest 2 View  Result Date: 01/05/2021 CLINICAL DATA:  Patient complains of persistent cough, wheezing, congestion, weakness and shortness of breaths for 1.5 months. Patient reports recent bronchitis and malignant neoplasm of left upper lobe of the lung. History of asthma, porta cath and chronic obstructive pulmonary disease. Former smoker. EXAM: CHEST - 2 VIEW COMPARISON:  CT chest-abdomen-pelvis 11/17/2020; X-ray chest 02/18/2015. FINDINGS: Right-sided chest port remains in place with distal tip terminating at the level of the distal SVC. Normal heart size. Atherosclerotic calcification of the aortic knob. Chronic left suprahilar/apical scarring. New left apical opacity compared to the previous CT. Right lung is clear. No pleural effusion or pneumothorax. IMPRESSION: New left apical airspace opacity, which may represent pneumonia. Recurrent/progressive malignancy not excluded given proximity to the post treatment changes within the left suprahilar/apical region. Radiographic follow-up is recommended to ensure resolution. Electronically Signed   By: Davina Poke D.O.   On: 01/05/2021 15:29   CT Angio Chest PE W and/or Wo Contrast  Result Date: 01/06/2021 CLINICAL DATA:  75 year old female with progressive worsening shortness of breath over the past 3 weeks. Suspected pulmonary embolism. EXAM: CT ANGIOGRAPHY CHEST WITH CONTRAST TECHNIQUE: Multidetector CT imaging of the chest was performed using the standard protocol during bolus administration of intravenous  contrast. Multiplanar CT image reconstructions and MIPs were obtained to evaluate the vascular anatomy. CONTRAST:  22mL OMNIPAQUE IOHEXOL 350 MG/ML SOLN COMPARISON:  CT the chest, abdomen and pelvis 11/17/2020. FINDINGS: Cardiovascular: No filling defects within the pulmonary arterial tree to suggest pulmonary embolism. Heart size is normal. There is no significant pericardial fluid, thickening or pericardial calcification. There is aortic atherosclerosis, as well as atherosclerosis of the great vessels of the mediastinum and the coronary arteries, including calcified atherosclerotic plaque in the left anterior descending coronary artery. Mediastinum/Nodes: No pathologically enlarged mediastinal or hilar lymph nodes. Esophagus is unremarkable in appearance. No axillary lymphadenopathy. Lungs/Pleura: Chronic mass-like architectural distortion in the perihilar aspect of the left lung involving portions of the left upper lobe and superior segment of the left lower lobe. New compared to the recent prior examination is a combination of atelectasis and airspace consolidation in the left upper lobe which is now essentially completely non aerated. There is compensatory hyperexpansion of the left lower lobe. 5 mm ground-glass attenuation nodule in the medial aspect of the right lower lobe (axial image 65 of series 8), unchanged. Clustered peripheral micro nodularity in the right lower lobe (axial image 53 of series 8), also unchanged, likely chronic mucoid impaction within terminal bronchioles. No other definite new suspicious appearing pulmonary nodules or masses are noted. No acute consolidative airspace disease. No pleural effusions. Mild diffuse bronchial wall thickening with mild centrilobular and paraseptal emphysema. Upper Abdomen: Aortic atherosclerosis. Musculoskeletal: There are no aggressive appearing lytic or blastic lesions noted in the visualized portions of the skeleton. Review of the MIP images confirms the  above findings. IMPRESSION: 1. No evidence of pulmonary embolism. 2. Interval development of a combination of atelectasis and airspace consolidation (i.e., postobstructive pneumonia) in the left upper lobe. No centrally obstructing lesion identified. 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 3. Previously noted small right-sided pulmonary nodules are stable, likely benign. Continued attention on follow-up studies is recommended. 4. Aortic atherosclerosis, in addition to left anterior descending coronary artery disease. Assessment for potential risk factor modification, dietary therapy or pharmacologic therapy may be warranted, if clinically indicated. Aortic Atherosclerosis (ICD10-I70.0). Electronically Signed   By: Vinnie Langton M.D.   On: 01/06/2021  06:09      ASSESSMENT/PLAN   Clinically her post obstructive pneumonia is much improved ok to go home with out patient follow up on Friday -incentive spirometry -mucinex -complete antibiotics -mucomyst for 5 more days -prednisone until Friday ( prednisone) Continue her pre admit meds  [place], on cpap Continue cpap  Lung cancer (adeno) stage III, s/p radiation Following oncology recs     Thank you for allowing me to participate in the care of this patient.   Patient/Family are satisfied with care plan and all questions have been answered.  This document was prepared using Dragon voice recognition software and may include unintentional dictation errors.     Wallene Huh, M.D.  Division of [facility]

## 2021-01-12 NOTE — Discharge Summary (Signed)
Jennifer Hodges HAL:[id number] DOB: 10-26-1945 DOA: 01/06/2021  PCP: [name], MD  Admit date: 01/06/2021 Discharge date: 01/12/2021  Admitted From: home Disposition:  home  Recommendations for Outpatient Follow-up:  Follow up with PCP in 1 week Please obtain BMP/CBC in one week Please follow up oncology in one week F/u with pccm in 3 days.    Discharge Condition:Stable CODE STATUS:full  Diet recommendation: Heart Healthy  Brief/Interim Summary: Per HPI: Jennifer Hodges is a 75 y.o. female with medical history significant for history of stage III adenocarcinoma of the lung status post chemoradiation as well as completion of maintenance durvalumab in February 2022, history of GERD, diverticulosis, sleep apnea and vertigo who presents to the emergency room for evaluation of worsening shortness of breath.cxr Stable chronic changes. Continued left apical airspace opacity which could reflect pneumonia or recurrent malignancy. Findings unchanged since prior study.   Postobstructive pneumonia Patient has a history of adenocarcinoma of the left lung and is status post chemoradiation therapy as well as immunotherapy influenza and  covid negative Treated with iv antibiotics. Was given iv steroid with transition to po 20mg  qd per pccm request until f/u with Dr. [name] this week. Can follow-up as outpatient with pulmonologist and decision about bronchoscopy and all further management can be decided I-S and flutter valve          Sleep apnea Continue CPAP at bedtime       History of lung cancer Patient has a history of adenocarcinoma of the left lung and is status post chemo and radiation therapy as well as immunotherapy. CT angio chest does not reveal any evidence of PE.  No clear signs of lung cancer recurrence either. Follow up with oncology  If has recurrent symptoms as above will consider getting a PET CT scan followed by consideration for bronchoscopy Oncology was  consulted.   Discharge Diagnoses:  Principal Problem:   Postobstructive pneumonia Active Problems:   Sleep apnea   GERD (gastroesophageal reflux disease)   Malignant neoplasm of upper lobe of left lung (HCC)   Benign essential hypertension    Discharge Instructions  Discharge Instructions     Call MD for:  difficulty breathing, headache or visual disturbances   Complete by: As directed    Call MD for:  temperature >100.4   Complete by: As directed    Call MD for:  temperature >100.4   Complete by: As directed    Diet - low sodium heart healthy   Complete by: As directed    Diet - low sodium heart healthy   Complete by: As directed    Discharge instructions   Complete by: As directed    FOLLOW UP WITH DR [name] THIS WEEK   Discharge instructions   Complete by: As directed    F/U with Dr. [name] on Friday, call his clinic F/u with pcp in one week   Increase activity slowly   Complete by: As directed    Increase activity slowly   Complete by: As directed       Allergies as of 01/12/2021       Reactions   Aspirin Hives, Other (See Comments)   Difficulty breathing   Celebrex [celecoxib] Shortness Of Breath   Morphine And Related Nausea And Vomiting, Swelling   Adhesive [tape] Other (See Comments)   Took top layer of skin off when removed.   Clarithromycin Nausea And Vomiting   Codeine Nausea And Vomiting   Darvon [propoxyphene Hcl] Nausea And Vomiting  Demerol [meperidine] Nausea And Vomiting   Flonase [fluticasone Propionate] Other (See Comments)   Fungal infection   Simvastatin Other (See Comments)   "caused ulcers in mouth, and fever"   Talwin [pentazocine] Nausea And Vomiting        Medication List     STOP taking these medications    azithromycin 250 MG tablet Commonly known as: Zithromax       TAKE these medications    albuterol (2.5 MG/3ML) 0.083% nebulizer solution Commonly known as: PROVENTIL Take 2.5 mg by nebulization every 6  (six) hours as needed for wheezing.   albuterol 108 (90 Base) MCG/ACT inhaler Commonly known as: VENTOLIN HFA Inhale 2 puffs into the lungs every 6 (six) hours as needed for wheezing or shortness of breath.   AUSTRALIAN DREAM ARTHRITIS EX Apply 1 application topically 4 (four) times daily as needed (pain.).   benzonatate 200 MG capsule Commonly known as: TESSALON Take 1 capsule (200 mg total) by mouth 2 (two) times daily as needed for up to 7 days for cough. What changed:  how much to take when to take this reasons to take this   CAL-MAG-ZINC PO Take 1 tablet by mouth daily.   cetirizine 5 MG tablet Commonly known as: ZYRTEC Take 1 tablet (5 mg total) by mouth daily.   Chloraseptic Max Sore Throat 1.5-33 % Liqd Generic drug: Phenol-Glycerin Use as directed 1 spray in the mouth or throat as needed (throat irritation.).   Clotrimazole 1 % Oint Apply 1 application topically in the morning, at noon, and at bedtime.   Co Q 10 100 MG Caps Take 100 mg by mouth daily.   cyclobenzaprine 10 MG tablet Commonly known as: FLEXERIL Take 10 mg by mouth at bedtime.   DHEA 25 MG Caps Take 25 mg by mouth daily.   Ensure Take 237 mLs by mouth 2 (two) times daily between meals.   EQL Vitamin D3 25 MCG (1000 UT) capsule Generic drug: Cholecalciferol Take 4,000 Units by mouth daily.   ESTER C PO Take 1 tablet by mouth 3 (three) times daily.   ezetimibe 10 MG tablet Commonly known as: ZETIA Take 5 mg by mouth in the morning and at bedtime.   folic acid 937 MCG tablet Commonly known as: FOLVITE Take 400 mcg by mouth 2 (two) times daily.   Garlic 9024 MG Caps Take 1,000 mg by mouth daily.   guaiFENesin 600 MG 12 hr tablet Commonly known as: MUCINEX Take 1 tablet (600 mg total) by mouth 2 (two) times daily for 7 days.   guaiFENesin-dextromethorphan 100-10 MG/5ML syrup Commonly known as: ROBITUSSIN DM Take 5 mLs by mouth every 4 (four) hours as needed for cough.    HYDROcodone-acetaminophen 5-325 MG tablet Commonly known as: NORCO/VICODIN 1-2 tabs po bid prn What changed:  how much to take how to take this when to take this additional instructions   ipratropium 0.03 % nasal spray Commonly known as: ATROVENT SMARTSIG:1-2 Spray(s) Both Nares 3 Times Daily PRN   lidocaine-prilocaine cream Commonly known as: EMLA lidocaine-prilocaine 2.5 %-2.5 % topical cream   meclizine 25 MG tablet Commonly known as: ANTIVERT Take 1 tablet (25 mg total) by mouth every 6 (six) hours as needed for dizziness.   montelukast 10 MG tablet Commonly known as: SINGULAIR Take 10 mg by mouth at bedtime.   ondansetron 4 MG disintegrating tablet Commonly known as: ZOFRAN-ODT Take 4 mg by mouth every 8 (eight) hours as needed for nausea/vomiting.   pantoprazole 20  MG tablet Commonly known as: PROTONIX Take 1 tablet (20 mg total) by mouth daily for 14 days.   predniSONE 20 MG tablet Commonly known as: DELTASONE Take 1 tablet (20 mg total) by mouth daily with breakfast for 7 days. Start taking on: January 13, 2021   pyridOXINE 100 MG tablet Commonly known as: VITAMIN B-6 Take 100 mg by mouth daily.   RECLAST IV Inject 1 Dose into the vein as directed. ONCE A YEAR   Selenium 200 MCG Caps Take 200 mcg by mouth daily.   sucralfate 1 g tablet Commonly known as: CARAFATE 1 tablet 3 (three) times daily as needed.   Systane 0.4-0.3 % Soln Generic drug: Polyethyl Glycol-Propyl Glycol Place 1 drop into both eyes 5 (five) times daily as needed (dry/irritated eyes).   triamcinolone cream 0.1 % Commonly known as: KENALOG Apply 1 application topically 2 (two) times daily.   Ubrelvy 100 MG Tabs Generic drug: Ubrogepant Take by mouth.   Vitamin A 2250 MCG (7500 UT) Caps Take 1 capsule by mouth daily.   Vitamin B-12 2500 MCG Subl Place 2,500 mcg under the tongue daily.   vitamin E 180 MG (400 UNITS) capsule Take 400 Units by mouth daily.         Follow-up Information     Erby Pian, MD Follow up.   Specialty: Specialist Why: per dr. [name] needs to come to clinic on friday Contact information: [place] [address] [phone]         [name], MD Follow up in 1 week(s).   Specialty: Family Medicine Contact information: [place] [address] [phone]         Jacquelin Hawking, NP Follow up in 1 week(s).   Specialty: Oncology Contact information: [place] [address] [phone]                Allergies  Allergen Reactions   Aspirin Hives and Other (See Comments)    Difficulty breathing   Celebrex [Celecoxib] Shortness Of Breath   Morphine And Related Nausea And Vomiting and Swelling   Adhesive [Tape] Other (See Comments)    Took top layer of skin off when removed.   Clarithromycin Nausea And Vomiting   Codeine Nausea And Vomiting   Darvon [Propoxyphene Hcl] Nausea And Vomiting   Demerol [Meperidine] Nausea And Vomiting   Flonase [Fluticasone Propionate] Other (See Comments)    Fungal infection   Simvastatin Other (See Comments)    "caused ulcers in mouth, and fever"   Talwin [Pentazocine] Nausea And Vomiting    Consultations: Oncology, pulmonary   Procedures/Studies: DG Chest 2 View  Result Date: 01/05/2021 CLINICAL DATA:  Shortness of breath EXAM: CHEST - 2 VIEW COMPARISON:  01/04/2021 FINDINGS: Right Port-A-Cath remains in place, unchanged. Heart is normal size. Chronic left suprahilar/apical scarring, stable. Continued left apical airspace opacity, similar to prior study. Right lung is clear. No acute confluent opacities or effusions. No acute bony abnormality. IMPRESSION: Stable chronic changes. Continued left apical airspace opacity which could reflect pneumonia or recurrent malignancy. Findings unchanged since prior study. Electronically Signed   By: Rolm Baptise M.D.   On: 01/05/2021 18:32   DG Chest 2  View  Result Date: 01/05/2021 CLINICAL DATA:  Patient complains of persistent cough, wheezing, congestion, weakness and shortness of breaths for 1.5 months. Patient reports recent bronchitis and malignant neoplasm of left upper lobe of the lung. History of asthma, porta cath and  chronic obstructive pulmonary disease. Former smoker. EXAM: CHEST - 2 VIEW COMPARISON:  CT chest-abdomen-pelvis 11/17/2020; X-ray chest 02/18/2015. FINDINGS: Right-sided chest port remains in place with distal tip terminating at the level of the distal SVC. Normal heart size. Atherosclerotic calcification of the aortic knob. Chronic left suprahilar/apical scarring. New left apical opacity compared to the previous CT. Right lung is clear. No pleural effusion or pneumothorax. IMPRESSION: New left apical airspace opacity, which may represent pneumonia. Recurrent/progressive malignancy not excluded given proximity to the post treatment changes within the left suprahilar/apical region. Radiographic follow-up is recommended to ensure resolution. Electronically Signed   By: Davina Poke D.O.   On: 01/05/2021 15:29   CT Angio Chest PE W and/or Wo Contrast  Result Date: 01/06/2021 CLINICAL DATA:  75 year old female with progressive worsening shortness of breath over the past 3 weeks. Suspected pulmonary embolism. EXAM: CT ANGIOGRAPHY CHEST WITH CONTRAST TECHNIQUE: Multidetector CT imaging of the chest was performed using the standard protocol during bolus administration of intravenous contrast. Multiplanar CT image reconstructions and MIPs were obtained to evaluate the vascular anatomy. CONTRAST:  31mL OMNIPAQUE IOHEXOL 350 MG/ML SOLN COMPARISON:  CT the chest, abdomen and pelvis 11/17/2020. FINDINGS: Cardiovascular: No filling defects within the pulmonary arterial tree to suggest pulmonary embolism. Heart size is normal. There is no significant pericardial fluid, thickening or pericardial calcification. There is aortic atherosclerosis,  as well as atherosclerosis of the great vessels of the mediastinum and the coronary arteries, including calcified atherosclerotic plaque in the left anterior descending coronary artery. Mediastinum/Nodes: No pathologically enlarged mediastinal or hilar lymph nodes. Esophagus is unremarkable in appearance. No axillary lymphadenopathy. Lungs/Pleura: Chronic mass-like architectural distortion in the perihilar aspect of the left lung involving portions of the left upper lobe and superior segment of the left lower lobe. New compared to the recent prior examination is a combination of atelectasis and airspace consolidation in the left upper lobe which is now essentially completely non aerated. There is compensatory hyperexpansion of the left lower lobe. 5 mm ground-glass attenuation nodule in the medial aspect of the right lower lobe (axial image 65 of series 8), unchanged. Clustered peripheral micro nodularity in the right lower lobe (axial image 53 of series 8), also unchanged, likely chronic mucoid impaction within terminal bronchioles. No other definite new suspicious appearing pulmonary nodules or masses are noted. No acute consolidative airspace disease. No pleural effusions. Mild diffuse bronchial wall thickening with mild centrilobular and paraseptal emphysema. Upper Abdomen: Aortic atherosclerosis. Musculoskeletal: There are no aggressive appearing lytic or blastic lesions noted in the visualized portions of the skeleton. Review of the MIP images confirms the above findings. IMPRESSION: 1. No evidence of pulmonary embolism. 2. Interval development of a combination of atelectasis and airspace consolidation (i.e., postobstructive pneumonia) in the left upper lobe. No centrally obstructing lesion identified. 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 3. Previously noted small right-sided pulmonary nodules are stable, likely benign. Continued attention on follow-up studies is  recommended. 4. Aortic atherosclerosis, in addition to left anterior descending coronary artery disease. Assessment for potential risk factor modification, dietary therapy or pharmacologic therapy may be warranted, if clinically indicated. Aortic Atherosclerosis (ICD10-I70.0). Electronically Signed   By: Vinnie Langton M.D.   On: 01/06/2021 06:09      Subjective: Feels better today.  On room air.  Feels stronger.  No coughing no chest pain  Discharge Exam: Vitals:   01/12/21 0730 01/12/21 0830  BP:  130/69  Pulse:  88  Resp:  18  Temp:  97.6 F (36.4 C)  SpO2: 93% 94%   Vitals:   01/11/21 1955 01/12/21 0505 01/12/21 0730 01/12/21 0830  BP: 113/84 (!) 111/46  130/69  Pulse: 90 67  88  Resp: 16 18  18   Temp: 98.3 F (36.8 C) (!) 97.5 F (36.4 C)  97.6 F (36.4 C)  TempSrc: Oral Oral    SpO2: (!) 89% 95% 93% 94%  Weight:      Height:        General: Pt is alert, awake, not in acute distress Cardiovascular: RRR, S1/S2 +, no rubs, no gallops Respiratory: CTA bilaterally, no wheezing, no rhonchi Abdominal: Soft, NT, ND, bowel sounds + Extremities: no edema, no cyanosis    The results of significant diagnostics from this hospitalization (including imaging, microbiology, ancillary and laboratory) are listed below for reference.     Microbiology: Recent Results (from the past 240 hour(s))  Resp Panel by RT-PCR (Flu A&B, Covid) Nasopharyngeal Swab     Status: None   Collection Time: 01/05/21  8:33 PM   Specimen: Nasopharyngeal Swab; Nasopharyngeal(NP) swabs in vial transport medium  Result Value Ref Range Status   SARS Coronavirus 2 by RT PCR NEGATIVE NEGATIVE Final    Comment: (NOTE) SARS-CoV-2 target nucleic acids are NOT DETECTED.  The SARS-CoV-2 RNA is generally detectable in upper respiratory specimens during the acute phase of infection. The lowest concentration of SARS-CoV-2 viral copies this assay can detect is 138 copies/mL. A negative result does not preclude  SARS-Cov-2 infection and should not be used as the sole basis for treatment or other patient management decisions. A negative result may occur with  improper specimen collection/handling, submission of specimen other than nasopharyngeal swab, presence of viral mutation(s) within the areas targeted by this assay, and inadequate number of viral copies(<138 copies/mL). A negative result must be combined with clinical observations, patient history, and epidemiological information. The expected result is Negative.  Fact Sheet for Patients:  EntrepreneurPulse.com.au  Fact Sheet for Healthcare Providers:  IncredibleEmployment.be  This test is no t yet approved or cleared by the [place] FDA and  has been authorized for detection and/or diagnosis of SARS-CoV-2 by FDA under an Emergency Use Authorization (EUA). This EUA will remain  in effect (meaning this test can be used) for the duration of the COVID-19 declaration under Section 564(b)(1) of the Act, 21 U.S.C.section 360bbb-3(b)(1), unless the authorization is terminated  or revoked sooner.       Influenza A by PCR NEGATIVE NEGATIVE Final   Influenza B by PCR NEGATIVE NEGATIVE Final    Comment: (NOTE) The Xpert Xpress SARS-CoV-2/FLU/RSV plus assay is intended as an aid in the diagnosis of influenza from Nasopharyngeal swab specimens and should not be used as a sole basis for treatment. Nasal washings and aspirates are unacceptable for Xpert Xpress SARS-CoV-2/FLU/RSV testing.  Fact Sheet for Patients: EntrepreneurPulse.com.au  Fact Sheet for Healthcare Providers: IncredibleEmployment.be  This test is not yet approved or cleared by the [place] FDA and has been authorized for detection and/or diagnosis of SARS-CoV-2 by FDA under an Emergency Use Authorization (EUA). This EUA will remain in effect (meaning this test can be used) for the duration of  the COVID-19 declaration under Section 564(b)(1) of the Act, 21 U.S.C. section 360bbb-3(b)(1), unless the authorization is terminated or revoked.  Performed at [facility], [address] [place], [place] [address]   Expectorated Sputum Assessment w Gram Stain, Rflx to Resp Cult     Status: None  Collection Time: 01/06/21  9:36 PM   Specimen: Expectorated Sputum  Result Value Ref Range Status   Specimen Description EXPECTORATED SPUTUM  Final   Special Requests NONE  Final   Sputum evaluation   Final    THIS SPECIMEN IS ACCEPTABLE FOR SPUTUM CULTURE Performed at [facility], [address], [place], [place] [address]    Report Status 01/06/2021 FINAL  Final  Culture, Respiratory w Gram Stain     Status: None   Collection Time: 01/06/21  9:36 PM  Result Value Ref Range Status   Specimen Description   Final    EXPECTORATED SPUTUM Performed at [facility], [address], [place], [place] [address]    Special Requests   Final    NONE Reflexed from [id number] Performed at [facility], [place], [place] [address]    Gram Stain   Final    RARE SQUAMOUS EPITHELIAL CELLS PRESENT NO WBC SEEN RARE GRAM POSITIVE COCCI    Culture   Final    RARE Normal respiratory flora-no Staph aureus or Pseudomonas seen Performed at [facility], [place] [address], [place], [place] [address]    Report Status 01/09/2021 FINAL  Final     Labs: BNP (last 3 results) No results for input(s): BNP in the last 8760 hours. Basic Metabolic Panel: Recent Labs  Lab 01/05/21 1830 01/07/21 0706  NA 139 136  K 3.9 4.1  CL 103 98  CO2 28 31  GLUCOSE 130* 111*  BUN 7* 9  CREATININE 0.37* 0.55  CALCIUM 9.0 9.2   Liver Function Tests: Recent Labs  Lab 01/05/21 1830  AST 19  ALT 15  ALKPHOS 76  BILITOT 0.8  PROT 7.2  ALBUMIN 4.1   No results for input(s): LIPASE, AMYLASE in the last 168 hours. No results for input(s): AMMONIA in the  last 168 hours. CBC: Recent Labs  Lab 01/05/21 1830 01/07/21 0706  WBC 7.4 7.2  NEUTROABS 4.7  --   HGB 13.7 13.2  HCT 39.8 38.1  MCV 93.0 92.9  PLT 246 239   Cardiac Enzymes: No results for input(s): CKTOTAL, CKMB, CKMBINDEX, TROPONINI in the last 168 hours. BNP: Invalid input(s): POCBNP CBG: No results for input(s): GLUCAP in the last 168 hours. D-Dimer No results for input(s): DDIMER in the last 72 hours. Hgb A1c No results for input(s): HGBA1C in the last 72 hours. Lipid Profile No results for input(s): CHOL, HDL, LDLCALC, TRIG, CHOLHDL, LDLDIRECT in the last 72 hours. Thyroid function studies No results for input(s): TSH, T4TOTAL, T3FREE, THYROIDAB in the last 72 hours.  Invalid input(s): FREET3 Anemia work up No results for input(s): VITAMINB12, FOLATE, FERRITIN, TIBC, IRON, RETICCTPCT in the last 72 hours. Urinalysis    Component Value Date/Time   COLORURINE YELLOW (A) 01/30/2019 1315   APPEARANCEUR Clear 06/22/2020 1012   LABSPEC 1.012 01/30/2019 1315   PHURINE 6.0 01/30/2019 1315   GLUCOSEU Negative 06/22/2020 1012   GLUCOSEU NEGATIVE 06/19/2014 1002   HGBUR NEGATIVE 01/30/2019 1315   BILIRUBINUR Negative 06/22/2020 [place] 01/30/2019 1315   PROTEINUR Negative 06/22/2020 1012   PROTEINUR NEGATIVE 01/30/2019 1315   UROBILINOGEN 1.0 03/04/2015 1339   UROBILINOGEN 0.2 06/19/2014 1002   NITRITE Negative 06/22/2020 1012   NITRITE NEGATIVE 01/30/2019 1315   LEUKOCYTESUR Negative 06/22/2020 1012   LEUKOCYTESUR NEGATIVE 01/30/2019 1315   Sepsis Labs Invalid input(s): PROCALCITONIN,  WBC,  LACTICIDVEN Microbiology Recent Results (from the past 240 hour(s))  Resp Panel by RT-PCR (Flu A&B, Covid) Nasopharyngeal Swab     Status: None   Collection Time: 01/05/21  8:33 PM   Specimen: Nasopharyngeal Swab; Nasopharyngeal(NP) swabs in vial transport medium  Result Value Ref Range Status   SARS Coronavirus 2 by RT PCR NEGATIVE NEGATIVE Final     Comment: (NOTE) SARS-CoV-2 target nucleic acids are NOT DETECTED.  The SARS-CoV-2 RNA is generally detectable in upper respiratory specimens during the acute phase of infection. The lowest concentration of SARS-CoV-2 viral copies this assay can detect is 138 copies/mL. A negative result does not preclude SARS-Cov-2 infection and should not be used as the sole basis for treatment or other patient management decisions. A negative result may occur with  improper specimen collection/handling, submission of specimen other than nasopharyngeal swab, presence of viral mutation(s) within the areas targeted by this assay, and inadequate number of viral copies(<138 copies/mL). A negative result must be combined with clinical observations, patient history, and epidemiological information. The expected result is Negative.  Fact Sheet for Patients:  EntrepreneurPulse.com.au  Fact Sheet for Healthcare Providers:  IncredibleEmployment.be  This test is no t yet approved or cleared by the [place] FDA and  has been authorized for detection and/or diagnosis of SARS-CoV-2 by FDA under an Emergency Use Authorization (EUA). This EUA will remain  in effect (meaning this test can be used) for the duration of the COVID-19 declaration under Section 564(b)(1) of the Act, 21 U.S.C.section 360bbb-3(b)(1), unless the authorization is terminated  or revoked sooner.       Influenza A by PCR NEGATIVE NEGATIVE Final   Influenza B by PCR NEGATIVE NEGATIVE Final    Comment: (NOTE) The Xpert Xpress SARS-CoV-2/FLU/RSV plus assay is intended as an aid in the diagnosis of influenza from Nasopharyngeal swab specimens and should not be used as a sole basis for treatment. Nasal washings and aspirates are unacceptable for Xpert Xpress SARS-CoV-2/FLU/RSV testing.  Fact Sheet for Patients: EntrepreneurPulse.com.au  Fact Sheet for Healthcare  Providers: IncredibleEmployment.be  This test is not yet approved or cleared by the [place] FDA and has been authorized for detection and/or diagnosis of SARS-CoV-2 by FDA under an Emergency Use Authorization (EUA). This EUA will remain in effect (meaning this test can be used) for the duration of the COVID-19 declaration under Section 564(b)(1) of the Act, 21 U.S.C. section 360bbb-3(b)(1), unless the authorization is terminated or revoked.  Performed at [facility], [address] [place], [place] [address]   Expectorated Sputum Assessment w Gram Stain, Rflx to Resp Cult     Status: None   Collection Time: 01/06/21  9:36 PM   Specimen: Expectorated Sputum  Result Value Ref Range Status   Specimen Description EXPECTORATED SPUTUM  Final   Special Requests NONE  Final   Sputum evaluation   Final    THIS SPECIMEN IS ACCEPTABLE FOR SPUTUM CULTURE Performed at [facility], [address], [place], [place] [address]    Report Status 01/06/2021 FINAL  Final  Culture, Respiratory w Gram Stain     Status: None   Collection Time: 01/06/21  9:36 PM  Result Value Ref Range Status   Specimen Description   Final    EXPECTORATED SPUTUM Performed at [facility], [address], [place], [place] [address]    Special Requests   Final    NONE Reflexed from [phone] [phone] Performed at [facility], [address], [place], [place] [address]    Gram Stain   Final  RARE SQUAMOUS EPITHELIAL CELLS PRESENT NO WBC SEEN RARE GRAM POSITIVE COCCI    Culture   Final    RARE Normal respiratory flora-no Staph aureus or Pseudomonas seen Performed at [facility], [place] [address], [place], [place] [address]    Report Status 01/09/2021 FINAL  Final     Time coordinating discharge: Over 30 minutes  SIGNED:   Nolberto Hanlon, MD  [facility] 01/12/2021, 2:20 PM Pager   If 7PM-7AM, please contact  night-coverage [email or web address] Password [email or web address]

## 2021-01-18 ENCOUNTER — Other Ambulatory Visit: Payer: Self-pay | Admitting: Family Medicine

## 2021-01-18 DIAGNOSIS — Z1231 Encounter for screening mammogram for malignant neoplasm of breast: Secondary | ICD-10-CM

## 2021-02-17 ENCOUNTER — Other Ambulatory Visit: Payer: Self-pay

## 2021-02-17 ENCOUNTER — Ambulatory Visit
Admission: RE | Admit: 2021-02-17 | Discharge: 2021-02-17 | Disposition: A | Discharge status: Home / Self Care | Payer: Medicare Other | Source: Ambulatory Visit | Attending: Family Medicine | Admitting: Family Medicine

## 2021-02-17 DIAGNOSIS — Z1231 Encounter for screening mammogram for malignant neoplasm of breast: Secondary | ICD-10-CM | POA: Diagnosis present

## 2021-03-17 ENCOUNTER — Encounter: Payer: Self-pay | Admitting: Oncology

## 2021-03-24 ENCOUNTER — Ambulatory Visit
Admission: RE | Admit: 2021-03-24 | Discharge: 2021-03-24 | Disposition: A | Discharge status: Home / Self Care | Payer: Medicare Other | Source: Ambulatory Visit | Attending: Oncology | Admitting: Oncology

## 2021-03-24 ENCOUNTER — Other Ambulatory Visit: Payer: Self-pay

## 2021-03-24 DIAGNOSIS — C3412 Malignant neoplasm of upper lobe, left bronchus or lung: Secondary | ICD-10-CM | POA: Insufficient documentation

## 2021-03-24 LAB — POCT I-STAT CREATININE: Creatinine, Ser: 0.5 mg/dL (ref 0.44–1.00)

## 2021-03-24 MED ORDER — IOHEXOL 300 MG/ML  SOLN
100.0000 mL | Freq: Once | INTRAMUSCULAR | Status: AC | PRN
  Administered 2021-03-24: 100 mL via INTRAVENOUS

## 2021-03-25 ENCOUNTER — Other Ambulatory Visit: Payer: Self-pay | Admitting: Specialist

## 2021-03-25 DIAGNOSIS — C3401 Malignant neoplasm of right main bronchus: Secondary | ICD-10-CM

## 2021-03-25 DIAGNOSIS — R590 Localized enlarged lymph nodes: Secondary | ICD-10-CM

## 2021-03-25 DIAGNOSIS — R0609 Other forms of dyspnea: Secondary | ICD-10-CM

## 2021-03-29 ENCOUNTER — Encounter: Payer: Self-pay | Admitting: Oncology

## 2021-03-29 ENCOUNTER — Inpatient Hospital Stay (HOSPITAL_BASED_OUTPATIENT_CLINIC_OR_DEPARTMENT_OTHER): Payer: Medicare Other | Admitting: Oncology

## 2021-03-29 ENCOUNTER — Other Ambulatory Visit: Payer: Self-pay

## 2021-03-29 ENCOUNTER — Inpatient Hospital Stay: Payer: Medicare Other | Attending: Oncology

## 2021-03-29 VITALS — BP 148/80 | HR 91 | Temp 96.5°F | Resp 20 | Ht 61.0 in | Wt 157.0 lb

## 2021-03-29 DIAGNOSIS — Z452 Encounter for adjustment and management of vascular access device: Secondary | ICD-10-CM | POA: Insufficient documentation

## 2021-03-29 DIAGNOSIS — Z85118 Personal history of other malignant neoplasm of bronchus and lung: Secondary | ICD-10-CM | POA: Insufficient documentation

## 2021-03-29 DIAGNOSIS — R590 Localized enlarged lymph nodes: Secondary | ICD-10-CM | POA: Diagnosis not present

## 2021-03-29 DIAGNOSIS — Z08 Encounter for follow-up examination after completed treatment for malignant neoplasm: Secondary | ICD-10-CM | POA: Diagnosis not present

## 2021-03-29 DIAGNOSIS — C3412 Malignant neoplasm of upper lobe, left bronchus or lung: Secondary | ICD-10-CM

## 2021-03-29 LAB — CBC WITH DIFFERENTIAL/PLATELET
Abs Immature Granulocytes: 0.02 10*3/uL (ref 0.00–0.07)
Basophils Absolute: 0 10*3/uL (ref 0.0–0.1)
Basophils Relative: 1 %
Eosinophils Absolute: 0.4 10*3/uL (ref 0.0–0.5)
Eosinophils Relative: 5 %
HCT: 40.8 % (ref 36.0–46.0)
Hemoglobin: 13.8 g/dL (ref 12.0–15.0)
Immature Granulocytes: 0 %
Lymphocytes Relative: 16 %
Lymphs Abs: 1.3 10*3/uL (ref 0.7–4.0)
MCH: 31.2 pg (ref 26.0–34.0)
MCHC: 33.8 g/dL (ref 30.0–36.0)
MCV: 92.1 fL (ref 80.0–100.0)
Monocytes Absolute: 0.5 10*3/uL (ref 0.1–1.0)
Monocytes Relative: 6 %
Neutro Abs: 5.9 10*3/uL (ref 1.7–7.7)
Neutrophils Relative %: 72 %
Platelets: 254 10*3/uL (ref 150–400)
RBC: 4.43 MIL/uL (ref 3.87–5.11)
RDW: 12.9 % (ref 11.5–15.5)
WBC: 8 10*3/uL (ref 4.0–10.5)
nRBC: 0 % (ref 0.0–0.2)

## 2021-03-29 LAB — COMPREHENSIVE METABOLIC PANEL
ALT: 17 U/L (ref 0–44)
AST: 22 U/L (ref 15–41)
Albumin: 4.3 g/dL (ref 3.5–5.0)
Alkaline Phosphatase: 77 U/L (ref 38–126)
Anion gap: 10 (ref 5–15)
BUN: 18 mg/dL (ref 8–23)
CO2: 27 mmol/L (ref 22–32)
Calcium: 9.5 mg/dL (ref 8.9–10.3)
Chloride: 97 mmol/L — ABNORMAL LOW (ref 98–111)
Creatinine, Ser: 0.48 mg/dL (ref 0.44–1.00)
GFR, Estimated: >60 mL/min (ref >60)
Glucose, Bld: 145 mg/dL — ABNORMAL HIGH (ref 70–99)
Potassium: 3.8 mmol/L (ref 3.5–5.1)
Sodium: 134 mmol/L — ABNORMAL LOW (ref 135–145)
Total Bilirubin: 0.4 mg/dL (ref 0.3–1.2)
Total Protein: 6.7 g/dL (ref 6.5–8.1)

## 2021-03-29 MED ORDER — HEPARIN SOD (PORK) LOCK FLUSH 100 UNIT/ML IV SOLN
500.0000 [IU] | Freq: Once | INTRAVENOUS | Status: AC
  Administered 2021-03-29: 500 [IU] via INTRAVENOUS
  Filled 2021-03-29: qty 5

## 2021-03-29 MED ORDER — SODIUM CHLORIDE 0.9% FLUSH
10.0000 mL | INTRAVENOUS | Status: DC | PRN
  Administered 2021-03-29: 10 mL via INTRAVENOUS
  Filled 2021-03-29: qty 10

## 2021-03-29 NOTE — Progress Notes (Signed)
Hematology/Oncology Consult note [facility]  [phone] [phone]  Patient Care Team: Marina Goodell, MD as PCP - General (Family Medicine) Glory Buff, RN as Registered Nurse Carmina Miller, MD as Referring Physician (Radiation Oncology) Creig Hines, MD as Consulting Physician (Oncology)   Name of the patient: Jennifer Hodges  [id number]  04-30-1945   Date of visit: 03/29/21  Diagnosis- Adenocarcinoma of the lung stage III T1b N2 M0  Chief complaint/ Reason for visit-routine follow-up of lung cancer  Heme/Onc history: patient is a 76 year old female who was seen by pulmonary and ENT for ongoing symptoms of bronchitis and possible laryngitis and received antibiotics for the same. She underwent CT chest in September 2020 which showed multiple bilateral lung nodules with associated mediastinal adenopathy and possible primary left upper lobe lung mass.  This was followed by a PET CT scan which showed a left upper lobe lung mass measuring 1.9 x 1.2 cm with an SUV of 10.7.  Conglomerate AP window adenopathy measuring 5.8 x 2.1 cm with an SUV of 14.  She was also noted to have hypermetabolic left hilar adenopathy and left supraclavicular 0.7 cm lymph node with an SUV of 4.6.  Noted to have a 7 mm right lower lobe lung nodule with faint metabolic activity.  No evidence of other distant metastatic disease.  This was followed by a CT super D chest without contrast.  The right lower lobe lung nodules were somewhat larger as compared to September 2020.  Bronchoscopy of the left upper lobe lung mass and the lymph node showed non-small cell lung carcinoma favoring adenocarcinoma.  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  EGFR and Ros as well as MET testing  negative. PD-L1 was 50%   Patient completed concurrent chemoradiation with weekly carbotaxol chemotherapy on 03/12/2019.  Maintenance durvalumab started in January 2021 after scan showed partial response.  Patient completed 1 year of maintenance durvalumab in February 2022  Interval history-patient was recently treated for postobstructive pneumonia in October 2022.  She received IV antibiotics as well as steroids.  Subsequently seen by Dr. Meredeth Ide last week.  She was noted to have some ongoing nonproductive cough and her respiratory meds were adjusted by Dr. Meredeth Ide.  However patient continues to report exertional shortness of breath as well as ongoing symptoms of wheezing which continues to bother her.  ECOG PS- 1 Pain scale- 0   Review of systems- Review of Systems  Constitutional:  Positive for malaise/fatigue.  Respiratory:  Positive for shortness of breath and wheezing.      Allergies  Allergen Reactions   Aspirin Hives and Other (See Comments)    Difficulty breathing   Celebrex [Celecoxib] Shortness Of Breath   Morphine And Related Nausea And Vomiting and Swelling   Adhesive [Tape] Other (See Comments)    Took top layer of skin off when removed.   Clarithromycin Nausea And Vomiting   Codeine Nausea And Vomiting   Darvon [Propoxyphene Hcl] Nausea And Vomiting   Demerol [Meperidine] Nausea And Vomiting   Flonase [Fluticasone Propionate] Other (See Comments)    Fungal infection   Simvastatin Other (See Comments)    "caused ulcers in mouth, and fever"   Talwin [Pentazocine] Nausea And Vomiting     Past Medical History:  Diagnosis Date   Anemia    Asthma  No Inhalers--Dr. [name] will order as needed   Bronchiectasis ([place])    mild   Chronic headaches     followed by [facility] migraines   COPD (chronic obstructive pulmonary disease) (HCC)    DDD (degenerative disc disease), lumbar    Diverticulosis    Family history of adverse reaction to anesthesia    PONV    Gall stones    history of   GERD (gastroesophageal reflux disease)    EGD 8/09- non bleeding erosive gastritis, documentd esophageal ulcerations.    Hiatal hernia    History of pneumonia    Hypercholesterolemia    IBS (irritable bowel syndrome)    Malignant neoplasm of upper lobe of left lung ([place]) 12/27/2018   Meniere disease    Murmur    Osteoarthritis    lumbar disc disease, left hip   Personal history of chemotherapy    Personal history of radiation therapy    PONV (postoperative nausea and vomiting)    "Only with last hip and I believe it was due to the morphine"    Sleep apnea    cpap asked to bring mask and tubing   Vertigo    Weakness of right side of body      Past Surgical History:  Procedure Laterality Date   ABDOMINAL HYSTERECTOMY  age 45   ANTERIOR CERVICAL DECOMP/DISCECTOMY FUSION N/A 02/24/2015   Procedure: CERVICAL FOUR-FIVE, CERVICAL FIVE-SIX, CERVICAL SIX-SEVEN ANTERIOR CERVICAL DECOMPRESSION/DISCECTOMY FUSION ;  Surgeon: [name], MD;  Location: [facility];  Service: Neurosurgery;  Laterality: N/A;  C45 C56 C67 anterior cervical decompression with fusion interbody prosthesis plating and bonegraft   APPENDECTOMY     BACK SURGERY     4th lumbar fusion   BLADDER SURGERY N/A    with vaginal wall repair   BREAST CYST ASPIRATION Bilateral    neg   BREAST SURGERY Bilateral    cyst removed and reduction   CARDIAC CATHETERIZATION  2014   CARPAL TUNNEL RELEASE Right 02/11/2016   Procedure: CARPAL TUNNEL RELEASE;  Surgeon: [name], MD;  Location: [facility];  Service: Orthopedics;  Laterality: Right;   CATARACT EXTRACTION W/ INTRAOCULAR LENS IMPLANT Bilateral 2015   CHOLECYSTECTOMY     EUS N/A 05/31/2012   Procedure: UPPER ENDOSCOPIC ULTRASOUND (EUS) LINEAR;  Surgeon: [name], MD;  Location: [facility];  Service: Endoscopy;  Laterality: N/A;   EXCISIONAL HEMORRHOIDECTOMY     JOINT REPLACEMENT Bilateral    KNEE ARTHROSCOPY WITH LATERAL  MENISECTOMY Right 07/07/2015   Procedure: KNEE ARTHROSCOPY WITH LATERAL MENISECTOMY, PARTIAL SYNOVECTOMY;  Surgeon: [name], MD;  Location: [facility];  Service: Orthopedics;  Laterality: Right;   LUMBAR LAMINECTOMY     PORTA CATH INSERTION N/A 01/07/2019   Procedure: PORTA CATH INSERTION;  Surgeon: [name], MD;  Location: [facility];  Service: Cardiovascular;  Laterality: N/A;   REDUCTION MAMMAPLASTY  1990   RIGHT OOPHORECTOMY     TOTAL HIP ARTHROPLASTY Left 05/01/2014   Dr. [name]   TOTAL HIP ARTHROPLASTY Right 08/05/2014   Procedure: TOTAL HIP ARTHROPLASTY ANTERIOR APPROACH;  Surgeon: [name], MD;  Location: [facility];  Service: Orthopedics;  Laterality: Right;   ULNAR NERVE TRANSPOSITION Right 02/11/2016   Procedure: ULNAR NERVE DECOMPRESSION/TRANSPOSITION;  Surgeon: [name], MD;  Location: [facility];  Service: Orthopedics;  Laterality: Right;   VIDEO BRONCHOSCOPY WITH ENDOBRONCHIAL ULTRASOUND Left 12/19/2018   Procedure: VIDEO BRONCHOSCOPY WITH ENDOBRONCHIAL ULTRASOUND, LEFT, SLEEP APNEA;  Surgeon: [name], MD;  Location: [facility];  Service: Thoracic;  Laterality: Left;    Social History   Socioeconomic History   Marital status: Married    Spouse name: Not on file   Number of children: Not on file   Years of education: Not on file   Highest education level: Not on file  Occupational History   Not on file  Tobacco Use   Smoking status: Former    Types: Cigarettes    Quit date: 06/13/2007    Years since quitting: 13.8   Smokeless tobacco: Never  Vaping Use   Vaping Use: Never used  Substance and Sexual Activity   Alcohol use: No    Alcohol/week: 0.0 standard drinks   Drug use: No   Sexual activity: Never  Other Topics Concern   Not on file  Social History Narrative   Not on file   Social Determinants of Health   Financial Resource Strain: Not on file  Food Insecurity: Not on file  Transportation Needs: Not on file  Physical  Activity: Not on file  Stress: Not on file  Social Connections: Not on file  Intimate Partner Violence: Not on file    Family History  Problem Relation Age of Onset   Heart disease Mother        s/p stent   Hypertension Mother    Hypercholesterolemia Mother    Diabetes Father    Stomach cancer Other        uncle   Breast cancer Cousin    Breast cancer Paternal Aunt      Current Outpatient Medications:    albuterol (PROVENTIL) (2.5 MG/3ML) 0.083% nebulizer solution, Take 2.5 mg by nebulization every 6 (six) hours as needed for wheezing., Disp: , Rfl:    albuterol (VENTOLIN HFA) 108 (90 Base) MCG/ACT inhaler, Inhale 2 puffs into the lungs every 6 (six) hours as needed for wheezing or shortness of breath., Disp: 18 g, Rfl: 0   Bioflavonoid Products (ESTER C PO), Take 1 tablet by mouth 3 (three) times daily., Disp: , Rfl:    Calcium-Magnesium-Zinc (CAL-MAG-ZINC PO), Take 1 tablet by mouth daily., Disp: , Rfl:    cetirizine (ZYRTEC) 5 MG tablet, Take 1 tablet (5 mg total) by mouth daily., Disp: 30 tablet, Rfl: 0   CHLORASEPTIC MAX SORE THROAT 1.5-33 % LIQD, Use as directed 1 spray in the mouth or throat as needed (throat irritation.). , Disp: , Rfl:    Cholecalciferol (EQL VITAMIN D3) 25 MCG (1000 UT) capsule, Take 4,000 Units by mouth daily., Disp: , Rfl:    Clotrimazole 1 % OINT, Apply 1 application topically in the morning, at noon, and at bedtime., Disp: 56.7 g, Rfl: 0   Coenzyme Q10 (CO Q 10) 100 MG CAPS, Take 100 mg by mouth daily. , Disp: , Rfl:    Cyanocobalamin (VITAMIN B-12) 2500 MCG SUBL, Place 2,500 mcg under the tongue daily., Disp: , Rfl:    cyclobenzaprine (FLEXERIL) 10 MG tablet, Take 10 mg by mouth at bedtime., Disp: , Rfl:    DHEA 25 MG CAPS, Take 25 mg by mouth daily., Disp: , Rfl:    Ensure (ENSURE), Take 237 mLs by mouth 2 (two) times daily between meals., Disp: , Rfl:    ezetimibe (ZETIA) 10 MG tablet, Take 5 mg by mouth in the morning and at bedtime. , Disp: , Rfl:     folic acid (FOLVITE) 400 MCG tablet, Take 400 mcg by mouth 2 (two) times daily. , Disp: , Rfl:  Garlic 3532 MG CAPS, Take 1,000 mg by mouth daily., Disp: , Rfl:    guaiFENesin-dextromethorphan (ROBITUSSIN DM) 100-10 MG/5ML syrup, Take 5 mLs by mouth every 4 (four) hours as needed for cough., Disp: , Rfl:    Histamine Dihydrochloride (AUSTRALIAN DREAM ARTHRITIS EX), Apply 1 application topically 4 (four) times daily as needed (pain.)., Disp: , Rfl:    HYDROcodone-acetaminophen (NORCO/VICODIN) 5-325 MG tablet, 1-2 tabs po bid prn (Patient taking differently: Take 1 tablet by mouth at bedtime.), Disp: 10 tablet, Rfl: 0   ipratropium (ATROVENT) 0.03 % nasal spray, SMARTSIG:1-2 Spray(s) Both Nares 3 Times Daily PRN, Disp: , Rfl:    lidocaine-prilocaine (EMLA) cream, lidocaine-prilocaine 2.5 %-2.5 % topical cream, Disp: , Rfl:    meclizine (ANTIVERT) 25 MG tablet, Take 1 tablet (25 mg total) by mouth every 6 (six) hours as needed for dizziness., Disp: 30 tablet, Rfl: 0   montelukast (SINGULAIR) 10 MG tablet, Take 10 mg by mouth at bedtime. , Disp: , Rfl:    ondansetron (ZOFRAN-ODT) 4 MG disintegrating tablet, Take 4 mg by mouth every 8 (eight) hours as needed for nausea/vomiting., Disp: , Rfl:    pantoprazole (PROTONIX) 20 MG tablet, Take 1 tablet (20 mg total) by mouth daily for 14 days., Disp: 14 tablet, Rfl: 0   Polyethyl Glycol-Propyl Glycol (SYSTANE) 0.4-0.3 % SOLN, Place 1 drop into both eyes 5 (five) times daily as needed (dry/irritated eyes). , Disp: , Rfl:    pyridOXINE (VITAMIN B-6) 100 MG tablet, Take 100 mg by mouth daily., Disp: , Rfl:    Selenium 200 MCG CAPS, Take 200 mcg by mouth daily., Disp: , Rfl:    sucralfate (CARAFATE) 1 g tablet, 1 tablet 3 (three) times daily as needed. , Disp: , Rfl:    triamcinolone (KENALOG) 0.1 %, Apply 1 application topically 2 (two) times daily., Disp: 30 g, Rfl: 0   UBRELVY 100 MG TABS, Take by mouth., Disp: , Rfl:    Vitamin A 2250 MCG (7500 UT) CAPS,  Take 1 capsule by mouth daily. , Disp: , Rfl:    vitamin E 400 UNIT capsule, Take 400 Units by mouth daily., Disp: , Rfl:    Zoledronic Acid (RECLAST IV), Inject 1 Dose into the vein as directed. ONCE A YEAR, Disp: , Rfl:   Physical exam:  Vitals:   03/29/21 1429  BP: (!) 148/80  Pulse: 91  Resp: 20  Temp: (!) 96.5 F (35.8 C)  TempSrc: Tympanic  SpO2: 96%  Weight: 157 lb (71.2 kg)  Height: $Remove'5\' 1"'OqaLvCT$  (1.549 m)   Physical Exam Constitutional:      General: She is not in acute distress. Cardiovascular:     Rate and Rhythm: Normal rate and regular rhythm.     Heart sounds: Normal heart sounds.  Pulmonary:     Effort: Pulmonary effort is normal.     Comments: Scattered bilateral wheezing Abdominal:     General: Bowel sounds are normal.     Palpations: Abdomen is soft.  Skin:    General: Skin is warm and dry.  Neurological:     Mental Status: She is alert and oriented to person, place, and time.     CMP Latest Ref Rng & Units 03/24/2021  Glucose 70 - 99 mg/dL -  BUN 8 - 23 mg/dL -  Creatinine 0.44 - 1.00 mg/dL 0.50  Sodium 135 - 145 mmol/L -  Potassium 3.5 - 5.1 mmol/L -  Chloride 98 - 111 mmol/L -  CO2 22 - 32  mmol/L -  Calcium 8.9 - 10.3 mg/dL -  Total Protein 6.5 - 8.1 g/dL -  Total Bilirubin 0.3 - 1.2 mg/dL -  Alkaline Phos 38 - 126 U/L -  AST 15 - 41 U/L -  ALT 0 - 44 U/L -   CBC Latest Ref Rng & Units 03/29/2021  WBC 4.0 - 10.5 K/uL 8.0  Hemoglobin 12.0 - 15.0 g/dL 13.8  Hematocrit 36.0 - 46.0 % 40.8  Platelets 150 - 400 K/uL 254    No images are attached to the encounter.  CT CHEST ABDOMEN PELVIS W CONTRAST  Result Date: 03/24/2021 CLINICAL DATA:  Restaging lung cancer. EXAM: CT CHEST, ABDOMEN, AND PELVIS WITH CONTRAST TECHNIQUE: Multidetector CT imaging of the chest, abdomen and pelvis was performed following the standard protocol during bolus administration of intravenous contrast. RADIATION DOSE REDUCTION: This exam was performed according to the  departmental dose-optimization program which includes automated exposure control, adjustment of the mA and/or kV according to patient size and/or use of iterative reconstruction technique. CONTRAST:  157mL OMNIPAQUE IOHEXOL 300 MG/ML  SOLN COMPARISON:  11/17/2020 and 07/20/2020 FINDINGS: CT CHEST FINDINGS Cardiovascular: The heart is normal in size. No pericardial effusion. Stable tortuosity and calcification of the thoracic aorta. Stable coronary artery calcifications. The esophagus is grossly normal. Mediastinum/Nodes: Progressive mediastinal and hilar lymphadenopathy when compared to the 2 prior studies. The right hilar node on image 27/2 measures 14 mm and previously measured 9 mm. 12 mm precarinal node on image 22/2 previously measured 7 mm 10 mm infrahilar node on 31/2 appears to be new. 8 mm subcarinal node on image 28/2 previously measured 5 mm. Stable soft tissue thickening surrounding the left hilum consistent with treated tumor. Lungs/Pleura: Stable left parahilar soft tissue density and associated mild bronchiectasis consistent radiation therapy. No findings suspicious for recurrent tumor in this area. Stable irregular largely linear subpleural opacity in the right lower lobe on image number 80 Stable 5 mm sub solid nodule in the right lower lobe on image number 102/3 A few patchy ground-glass opacities in the left lower lobe likely inflammation. No new or worrisome pulmonary lesions. Musculoskeletal: No breast masses, supraclavicular or axillary adenopathy. The bony thorax is intact. CT ABDOMEN PELVIS FINDINGS Hepatobiliary: No hepatic lesions to suggest metastatic disease. Stable intra and extrahepatic biliary dilatation likely due to prior cholecystectomy. Pancreas: No mass, inflammation or ductal dilatation. Spleen: Normal size.  No focal lesions. Adrenals/Urinary Tract: Adrenal glands are normal in stable. No renal lesions or hydronephrosis. The bladder is grossly normal. Stomach/Bowel: The stomach,  duodenum, small bowel and colon are unremarkable. No acute inflammatory changes, mass lesions or obstructive findings. Vascular/Lymphatic: Stable advanced atherosclerotic calcifications involving the aorta and iliac arteries but no aneurysm or dissection. The major venous structures are patent. No mesenteric or retroperitoneal mass or adenopathy. Reproductive: Surgically absent. Other: No pelvic mass or adenopathy. No free pelvic fluid collections. No inguinal mass or adenopathy. No abdominal wall hernia or subcutaneous lesions. No significant bony findings. Musculoskeletal: No significant bony findings. IMPRESSION: 1. Stable left parahilar soft tissue density and associated mild bronchiectasis consistent with radiation therapy. No findings suspicious for recurrent tumor in this area. 2. Progressive mediastinal and hilar lymphadenopathy. PET-CT may be helpful further evaluation. 3. Stable 5 mm sub solid right lower lobe pulmonary nodule. 4. No findings for abdominal/pelvic metastatic disease. 5. Stable intra and extrahepatic biliary dilatation likely due to prior cholecystectomy. 6. Stable advanced atherosclerotic calcifications involving the thoracic and abdominal aorta and iliac arteries. Aortic Atherosclerosis (ICD10-I70.0).  Electronically Signed   By: Marijo Sanes M.D.   On: 03/24/2021 14:30     Assessment and plan- Patient is a 76 y.o. female with history of stage III lung cancer adenocarcinoma s/p concurrent chemoradiation followed by maintenance durvalumab.  She is here to discuss CT scan results and further management  I have reviewed patient's most recent CT chest abdomen pelvis images independently and discussed findings with the patient.  Her most recent CT chest abdomen pelvis from 03/24/2020 gradually worsening mediastinal and hilar adenopathy.  Precarinal 12 mm lymph node presently which was previously 7 mm.  There was another infrahilar 10 mm lymph node which appears to be new.  Stable radiation  changes in the left parahilar soft tissue area with no concerning findings of recurrent tumor.  No other findings of distant metastatic disease.  Stable 5 mm right lower lobe lung nodule.  Patient has seen Dr. [name] and has an upcoming PET CT scan on 04/12/2021.  I will discuss her case at tumor board following that.  She will likely need a bronchoscopy to confirm if there is truly a mediastinal recurrence before deciding about further treatment.  I will reach out to her after we discussed her case at tumor board following PET CT scan.  With regards to her ongoing symptoms of shortness of breath and wheezing: Patient requests a second opinion from pulmonology.  I will refer her to Dr. Patsey Berthold for the same.   Visit Diagnosis 1. Encounter for follow-up surveillance of lung cancer      Dr. Randa Evens, MD, MPH [facility] [phone] 03/29/2021 2:31 PM

## 2021-03-30 ENCOUNTER — Telehealth: Payer: Self-pay | Admitting: *Deleted

## 2021-03-30 ENCOUNTER — Inpatient Hospital Stay: Payer: Medicare Other

## 2021-03-30 DIAGNOSIS — R0600 Dyspnea, unspecified: Secondary | ICD-10-CM

## 2021-03-30 DIAGNOSIS — Z85118 Personal history of other malignant neoplasm of bronchus and lung: Secondary | ICD-10-CM | POA: Diagnosis not present

## 2021-03-30 LAB — D-DIMER, QUANTITATIVE: D-Dimer, Quant: 0.28 ug/mL-FEU (ref 0.00–0.50)

## 2021-03-30 NOTE — Telephone Encounter (Signed)
Can you please call her

## 2021-03-30 NOTE — Telephone Encounter (Signed)
Patient called and states that she does want to see Pulmonologist near [place] and would like to be seen next week before her scan if at all possible.States she is still wheezing and short of breath

## 2021-03-30 NOTE — Telephone Encounter (Signed)
Spoke with patient who stated that she would like to transfer her care from Dr. [name] to Dr. Patsey Berthold. Pt requests appt before her PET scan on 1/30 if possible. Pt currently having persistent wheezing and sob not relieved with current medications. Pt states that she has been struggling with symptoms since November with no relief despite trying various prescriptions/medications from Dr. [name]. Pt informed that will try to coordinate an appt for her and give her call back with appt information. Pt verbalized understanding.

## 2021-03-30 NOTE — Telephone Encounter (Signed)
Per Dr. Patsey Berthold, may see on 04/14/21 at 4pm. Recommended ordering D-dimer as well as ECHO for further work up of shortness of breath. Orders placed. Pt made aware.

## 2021-04-02 ENCOUNTER — Encounter: Payer: Self-pay | Admitting: Oncology

## 2021-04-05 ENCOUNTER — Ambulatory Visit
Admission: RE | Admit: 2021-04-05 | Discharge: 2021-04-05 | Disposition: A | Discharge status: Home / Self Care | Payer: Medicare Other | Source: Ambulatory Visit | Attending: Oncology | Admitting: Oncology

## 2021-04-05 DIAGNOSIS — J449 Chronic obstructive pulmonary disease, unspecified: Secondary | ICD-10-CM | POA: Diagnosis not present

## 2021-04-05 DIAGNOSIS — R0609 Other forms of dyspnea: Secondary | ICD-10-CM | POA: Diagnosis not present

## 2021-04-05 DIAGNOSIS — I517 Cardiomegaly: Secondary | ICD-10-CM | POA: Insufficient documentation

## 2021-04-05 DIAGNOSIS — R0600 Dyspnea, unspecified: Secondary | ICD-10-CM | POA: Diagnosis present

## 2021-04-05 LAB — ECHOCARDIOGRAM COMPLETE
AR max vel: 3.12 cm2
AV Area VTI: 3.4 cm2
AV Area mean vel: 3.31 cm2
AV Mean grad: 1 mmHg
AV Peak grad: 2.4 mmHg
Ao pk vel: 0.78 m/s
Area-P 1/2: 3.11 cm2
MV VTI: 2.38 cm2
S' Lateral: 2.3 cm

## 2021-04-05 NOTE — Progress Notes (Signed)
*  PRELIMINARY RESULTS* Echocardiogram 2D Echocardiogram has been performed.  Sherrie Sport 04/05/2021, 10:35 AM

## 2021-04-12 ENCOUNTER — Encounter
Admission: RE | Admit: 2021-04-12 | Discharge: 2021-04-12 | Disposition: A | Discharge status: Home / Self Care | Payer: Medicare Other | Source: Ambulatory Visit | Attending: Specialist | Admitting: Specialist

## 2021-04-12 DIAGNOSIS — R0609 Other forms of dyspnea: Secondary | ICD-10-CM | POA: Diagnosis present

## 2021-04-12 DIAGNOSIS — C3401 Malignant neoplasm of right main bronchus: Secondary | ICD-10-CM | POA: Diagnosis not present

## 2021-04-12 DIAGNOSIS — R590 Localized enlarged lymph nodes: Secondary | ICD-10-CM | POA: Diagnosis present

## 2021-04-12 LAB — GLUCOSE, CAPILLARY: Glucose-Capillary: 82 mg/dL (ref 70–99)

## 2021-04-12 MED ORDER — FLUDEOXYGLUCOSE F - 18 (FDG) INJECTION
8.1000 | Freq: Once | INTRAVENOUS | Status: AC | PRN
  Administered 2021-04-12: 8.54 via INTRAVENOUS

## 2021-04-14 ENCOUNTER — Institutional Professional Consult (permissible substitution): Payer: Medicare Other | Admitting: Pulmonary Disease

## 2021-04-15 ENCOUNTER — Other Ambulatory Visit: Payer: Medicare Other

## 2021-04-15 NOTE — Progress Notes (Addendum)
Tumor Board Documentation  Jennifer Hodges was presented by Dr Jennifer Hodges at our Tumor Board on 04/15/2021, which included representatives from radiation oncology, medical oncology, pathology, radiology, pulmonology, navigation, internal medicine, pharmacy, genetics, research, palliative care, surgical.  Jennifer Hodges currently presents as a current patient, for discussion with history of the following treatments: neoadjuvant chemoradiation, immunotherapy.  Additionally, we reviewed previous medical and familial history, history of present illness, and recent lab results along with all available histopathologic and imaging studies. The tumor board considered available treatment options and made the following recommendations: Biopsy, EBUS by Dr Jennifer Hodges, If positive, Radiation Therapy can be offered per Dr Jennifer Hodges   The following procedures/referrals were also placed: No orders of the defined types were placed in this encounter.   Clinical Trial Status: not discussed   Staging used: AJCC Stage Group, To be determined AJCC Staging: T: 1b N: 2 M: 0 Group: Staage III Adeni=ocarcinoma of Left Lung now with PET positive node on right   National site-specific guidelines NCCN were discussed with respect to the case.  Tumor board is a meeting of clinicians from various specialty areas who evaluate and discuss patients for whom a multidisciplinary approach is being considered. Final determinations in the plan of care are those of the provider(s). The responsibility for follow up of recommendations given during tumor board is that of the provider.   Todays extended care, comprehensive team conference, Jennifer Hodges was not present for the discussion and was not examined.   Multidisciplinary Tumor Board is a multidisciplinary case peer review process.  Decisions discussed in the Multidisciplinary Tumor Board reflect the opinions of the specialists present at the conference without having examined the patient.   Ultimately, treatment and diagnostic decisions rest with the primary provider(s) and the patient.

## 2021-05-03 ENCOUNTER — Ambulatory Visit (INDEPENDENT_AMBULATORY_CARE_PROVIDER_SITE_OTHER): Payer: Medicare Other | Admitting: Pulmonary Disease

## 2021-05-03 ENCOUNTER — Telehealth: Payer: Self-pay | Admitting: Pulmonary Disease

## 2021-05-03 ENCOUNTER — Encounter: Payer: Self-pay | Admitting: Pulmonary Disease

## 2021-05-03 ENCOUNTER — Other Ambulatory Visit: Payer: Self-pay

## 2021-05-03 VITALS — BP 104/62 | HR 86 | Temp 97.3°F | Ht 61.0 in | Wt 165.8 lb

## 2021-05-03 DIAGNOSIS — J449 Chronic obstructive pulmonary disease, unspecified: Secondary | ICD-10-CM | POA: Diagnosis not present

## 2021-05-03 DIAGNOSIS — R590 Localized enlarged lymph nodes: Secondary | ICD-10-CM

## 2021-05-03 DIAGNOSIS — C3412 Malignant neoplasm of upper lobe, left bronchus or lung: Secondary | ICD-10-CM

## 2021-05-03 DIAGNOSIS — B3781 Candidal esophagitis: Secondary | ICD-10-CM

## 2021-05-03 DIAGNOSIS — R5383 Other fatigue: Secondary | ICD-10-CM

## 2021-05-03 DIAGNOSIS — B370 Candidal stomatitis: Secondary | ICD-10-CM

## 2021-05-03 MED ORDER — FLUCONAZOLE 100 MG PO TABS
100.0000 mg | ORAL_TABLET | Freq: Every day | ORAL | 0 refills | Status: AC
Start: 2021-05-03 — End: 2021-05-17

## 2021-05-03 MED ORDER — STIOLTO RESPIMAT 2.5-2.5 MCG/ACT IN AERS: 2.0000 | INHALATION_SPRAY | Freq: Every day | RESPIRATORY_TRACT | 0 refills | Status: DC

## 2021-05-03 NOTE — Patient Instructions (Addendum)
Do not use the budesonide (Pulmicort) on the nebulizer, I think it is causing some issues with thrush.  You may use your rescue (emergency) inhaler up to 4 times during the day if needed.  We are giving you a trial of an inhaler called Stiolto this is 2 puffs once daily.  Make sure you rinse your mouth after you use any inhaler.  I am giving you medication for thrush.  We are scheduling your procedure for 3 March at 12 noon.  You did very well when you walk today without any problems with your oxygen level.  I think what you are experiencing is more fatigue than shortness of breath.  This is the sensation that you are "out of gas".  This may be due to other issues not necessarily from the lungs.   We will see him in follow-up in 4 to 6 weeks time call sooner should any new problems arise.

## 2021-05-03 NOTE — Progress Notes (Addendum)
Subjective:    Patient ID: Jennifer Hodges, female    DOB: 07/14/1945, 76 y.o.   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  She presents for evaluation of potential EBUS with TBNA.  She is kindly referred by Dr. Wallene Huh, her primary care physician is Dr. Thereasa Distance.  With regards to her adenocarcinoma, she has been followed with serial chest CTs.  In October 2022 she had left upper lobe lung ectasis due to apparent obstruction from postradiation fibrosis causing pneumonia.  Subsequently in January 2023 she had follow-up CT that showed resolution of the atelectatic changers on the left however adenopathy in the precarinal and right hilar areas appeared enlarged this was followed by a PET/CT on January 30 which showed activity on the precarinal lymph node as well as the right hilar lymph nodes.  Patient also has been having some issues with dyspnea that she describes as "running out of gas".  This is actually more an issue of fatigue.  She has been on DuoNeb's and Pulmicort via nebulizer.  She has noted since starting on the Pulmicort that she has developed a sore throat and has white patches on the back of her throat.  She is not rinsing her mouth after use of the Pulmicort via nebulizer.  She does not note that the nebulizers help her any with her sensation of fatigue.  She had a 2D echo on 23 January that showed EF of 55 to 60% mild left ventricular hypertrophy and grade 1 diastolic dysfunction.  Is really no  evidence of significant valvular disease.  She has not had any fevers, chills or sweats.  No weight loss or anorexia.  No sputum production or hemoptysis.  She has not had any chest pain.  No lower extremity edema.  No calf tenderness.  She does not endorse any other symptomatology.   Review of Systems A 10 point review of systems was performed and it is as noted above otherwise negative.  Past Medical History:  Diagnosis Date   Anemia    Asthma    No Inhalers--Dr. [name] will order as needed   Bronchiectasis ([place])    mild   Chronic headaches     followed by [facility] migraines   COPD (chronic obstructive pulmonary disease) (HCC)    DDD (degenerative disc disease), lumbar    Diverticulosis    Family history of adverse reaction to anesthesia    PONV   Gall stones    history of   GERD (gastroesophageal reflux disease)    EGD 8/09- non bleeding erosive gastritis, documentd esophageal ulcerations.    Hiatal hernia    History of pneumonia    Hypercholesterolemia    IBS (irritable bowel syndrome)    Malignant neoplasm of upper lobe of left lung ([place]) 12/27/2018   Meniere disease    Murmur    Osteoarthritis    lumbar disc disease, left hip   Personal history of chemotherapy    Personal history of radiation therapy    PONV (postoperative nausea and vomiting)    "  Only with last hip and I believe it was due to the morphine"    Sleep apnea    cpap asked to bring mask and tubing   Vertigo    Weakness of right side of body    Past Surgical History:  Procedure Laterality Date   ABDOMINAL HYSTERECTOMY  age 19   ANTERIOR CERVICAL DECOMP/DISCECTOMY FUSION N/A 02/24/2015   Procedure: CERVICAL FOUR-FIVE, CERVICAL FIVE-SIX, CERVICAL SIX-SEVEN ANTERIOR CERVICAL DECOMPRESSION/DISCECTOMY FUSION ;  Surgeon: [name], MD;  Location: [facility];  Service: Neurosurgery;  Laterality: N/A;  C45 C56 C67 anterior cervical decompression with fusion interbody prosthesis plating and  bonegraft   APPENDECTOMY     BACK SURGERY     4th lumbar fusion   BLADDER SURGERY N/A    with vaginal wall repair   BREAST CYST ASPIRATION Bilateral    neg   BREAST SURGERY Bilateral    cyst removed and reduction   CARDIAC CATHETERIZATION  2014   CARPAL TUNNEL RELEASE Right 02/11/2016   Procedure: CARPAL TUNNEL RELEASE;  Surgeon: [name], MD;  Location: [facility];  Service: Orthopedics;  Laterality: Right;   CATARACT EXTRACTION W/ INTRAOCULAR LENS IMPLANT Bilateral 2015   CHOLECYSTECTOMY     EUS N/A 05/31/2012   Procedure: UPPER ENDOSCOPIC ULTRASOUND (EUS) LINEAR;  Surgeon: [name], MD;  Location: [facility];  Service: Endoscopy;  Laterality: N/A;   EXCISIONAL HEMORRHOIDECTOMY     JOINT REPLACEMENT Bilateral    KNEE ARTHROSCOPY WITH LATERAL MENISECTOMY Right 07/07/2015   Procedure: KNEE ARTHROSCOPY WITH LATERAL MENISECTOMY, PARTIAL SYNOVECTOMY;  Surgeon: [name], MD;  Location: [facility];  Service: Orthopedics;  Laterality: Right;   LUMBAR LAMINECTOMY     PORTA CATH INSERTION N/A 01/07/2019   Procedure: PORTA CATH INSERTION;  Surgeon: [name], MD;  Location: [facility];  Service: Cardiovascular;  Laterality: N/A;   REDUCTION MAMMAPLASTY  1990   RIGHT OOPHORECTOMY     TOTAL HIP ARTHROPLASTY Left 05/01/2014   Dr. [name]   TOTAL HIP ARTHROPLASTY Right 08/05/2014   Procedure: TOTAL HIP ARTHROPLASTY ANTERIOR APPROACH;  Surgeon: [name], MD;  Location: [facility];  Service: Orthopedics;  Laterality: Right;   ULNAR NERVE TRANSPOSITION Right 02/11/2016   Procedure: ULNAR NERVE DECOMPRESSION/TRANSPOSITION;  Surgeon: [name], MD;  Location: [facility];  Service: Orthopedics;  Laterality: Right;   VIDEO BRONCHOSCOPY WITH ENDOBRONCHIAL ULTRASOUND Left 12/19/2018   Procedure: VIDEO BRONCHOSCOPY WITH ENDOBRONCHIAL ULTRASOUND, LEFT, SLEEP APNEA;  Surgeon: [name], MD;  Location: [facility];  Service: Thoracic;  Laterality: Left;   Patient Active  Problem List   Diagnosis Date Noted   Postobstructive pneumonia 01/06/2021   Benign breast lumps 01/11/2019   Gout 01/11/2019   Hives 01/11/2019   Lung disease 01/11/2019   Migraine headache 01/11/2019   Goals of care, counseling/discussion 12/27/2018   Malignant neoplasm of upper lobe of left lung ([place]) 12/27/2018   Night sweats 08/07/2016   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   Osteopenia of multiple sites 04/27/2016   Left carpal tunnel syndrome 02/02/2016   Carotid stenosis, asymptomatic, bilateral 12/29/2015   Prediabetes 09/28/2015   Primary osteoarthritis involving multiple joints 09/28/2015   Knee pain 05/11/2015   Chest tightness 03/24/2015   Stool incontinence 03/24/2015   Urinary frequency 03/04/2015   Cervical spondylosis with radiculopathy 02/24/2015   Pre-syncope 02/19/2015   URI (upper respiratory infection) 02/15/2015   Neck pain 12/25/2014   Pelvic pain in female 11/16/2014   Primary osteoarthritis of hip 08/05/2014  Heel pain 06/01/2014   Diarrhea 06/01/2014   Health care maintenance 06/01/2014   Right leg pain 03/30/2014   Right arm pain 03/30/2014   Sinusitis 03/30/2014   Internal nasal lesion 11/12/2013   Other specified disorders of nose and nasal sinuses 11/12/2013   Chronic tension-type headache, intractable 10/30/2013   Intractable migraine without aura and without status migrainosus 10/30/2013   Occipital neuralgia of left side 10/30/2013   IBS (irritable bowel syndrome) 09/03/2013   Rib pain 08/04/2013   Cervical radiculitis 08/01/2013   Lumbar radiculitis 08/01/2013   Palpitations 07/25/2013   Benign essential hypertension 05/28/2013   Neuralgia, neuritis, and radiculitis, unspecified 05/28/2013   Vitamin D deficiency 05/28/2013   Elevated AFP 04/28/2013   Abnormality of alpha-fetoprotein 04/28/2013   Osteoporosis 11/12/2012   Incomplete emptying of bladder 07/11/2012   Prolapse of vaginal vault after hysterectomy 07/11/2012    Nonspecific (abnormal) findings on radiological and other examination of gastrointestinal tract 05/31/2012   Bronchiectasis ([place]) 02/05/2012   Sleep apnea 02/05/2012   Degenerative disc disease, cervical 02/05/2012   Hypercholesterolemia 02/05/2012   GERD (gastroesophageal reflux disease) 02/05/2012   Chronic headaches 02/05/2012   Family History  Problem Relation Age of Onset   Heart disease Mother        s/p stent   Hypertension Mother    Hypercholesterolemia Mother    Diabetes Father    Stomach cancer Other        uncle   Breast cancer Cousin    Breast cancer Paternal Aunt    Social History   Tobacco Use   Smoking status: Former    Packs/day: 1.00    Years: 40.00    Pack years: 40.00    Types: Cigarettes    Quit date: 06/13/2007    Years since quitting: 13.8   Smokeless tobacco: Never  Substance Use Topics   Alcohol use: No    Alcohol/week: 0.0 standard drinks   Current Meds  Medication Sig   albuterol (PROVENTIL) (2.5 MG/3ML) 0.083% nebulizer solution Take 2.5 mg by nebulization every 6 (six) hours as needed for wheezing.   albuterol (VENTOLIN HFA) 108 (90 Base) MCG/ACT inhaler Inhale 2 puffs into the lungs every 6 (six) hours as needed for wheezing or shortness of breath.   amoxicillin-clavulanate (AUGMENTIN) 500-125 MG tablet Take 1 tablet by mouth 3 (three) times daily.   Bioflavonoid Products (ESTER C PO) Take 1 tablet by mouth 3 (three) times daily.   budesonide (PULMICORT) 0.5 MG/2ML nebulizer solution Take by nebulization.   Calcium-Magnesium-Zinc (CAL-MAG-ZINC PO) Take 1 tablet by mouth daily.   cetirizine (ZYRTEC) 5 MG tablet Take 1 tablet (5 mg total) by mouth daily.   CHLORASEPTIC MAX SORE THROAT 1.5-33 % LIQD Use as directed 1 spray in the mouth or throat as needed (throat irritation.).    Cholecalciferol (EQL VITAMIN D3) 25 MCG (1000 UT) capsule Take 4,000 Units by mouth daily.   Clotrimazole 1 % OINT Apply 1 application topically in the morning, at noon,  and at bedtime.   Coenzyme Q10 (CO Q 10) 100 MG CAPS Take 100 mg by mouth daily.    Cyanocobalamin (VITAMIN B-12) 2500 MCG SUBL Place 2,500 mcg under the tongue daily.   cyclobenzaprine (FLEXERIL) 10 MG tablet Take 10 mg by mouth at bedtime.   DHEA 25 MG CAPS Take 25 mg by mouth daily.   Ensure (ENSURE) Take 237 mLs by mouth 2 (two) times daily between meals.   ezetimibe (ZETIA) 10 MG tablet Take 5 mg  by mouth in the morning and at bedtime.    fluconazole (DIFLUCAN) 100 MG tablet Take 1 tablet (100 mg total) by mouth daily for 14 days.   folic acid (FOLVITE) 742 MCG tablet Take 400 mcg by mouth 2 (two) times daily.    Garlic 5956 MG CAPS Take 1,000 mg by mouth daily.   guaiFENesin-dextromethorphan (ROBITUSSIN DM) 100-10 MG/5ML syrup Take 5 mLs by mouth every 4 (four) hours as needed for cough.   Histamine Dihydrochloride (AUSTRALIAN DREAM ARTHRITIS EX) Apply 1 application topically 4 (four) times daily as needed (pain.).   HYDROcodone-acetaminophen (NORCO/VICODIN) 5-325 MG tablet 1-2 tabs po bid prn (Patient taking differently: Take 1 tablet by mouth at bedtime.)   ipratropium (ATROVENT) 0.03 % nasal spray SMARTSIG:1-2 Spray(s) Both Nares 3 Times Daily PRN   ipratropium-albuterol (DUONEB) 0.5-2.5 (3) MG/3ML SOLN Inhale into the lungs.   lidocaine-prilocaine (EMLA) cream lidocaine-prilocaine 2.5 %-2.5 % topical cream   meclizine (ANTIVERT) 25 MG tablet Take 1 tablet (25 mg total) by mouth every 6 (six) hours as needed for dizziness.   ondansetron (ZOFRAN-ODT) 4 MG disintegrating tablet Take 4 mg by mouth every 8 (eight) hours as needed for nausea/vomiting.   Polyethyl Glycol-Propyl Glycol (SYSTANE) 0.4-0.3 % SOLN Place 1 drop into both eyes 5 (five) times daily as needed (dry/irritated eyes).    pyridOXINE (VITAMIN B-6) 100 MG tablet Take 100 mg by mouth daily.   Selenium 200 MCG CAPS Take 200 mcg by mouth daily.   sucralfate (CARAFATE) 1 g tablet 1 tablet 3 (three) times daily as needed.     Tiotropium Bromide-Olodaterol (STIOLTO RESPIMAT) 2.5-2.5 MCG/ACT AERS Inhale 2 puffs into the lungs daily.   triamcinolone (KENALOG) 0.1 % Apply 1 application topically 2 (two) times daily.   UBRELVY 100 MG TABS Take by mouth.   Vitamin A 2250 MCG (7500 UT) CAPS Take 1 capsule by mouth daily.    vitamin E 400 UNIT capsule Take 400 Units by mouth daily.   Zoledronic Acid (RECLAST IV) Inject 1 Dose into the vein as directed. ONCE A YEAR   [DISCONTINUED] montelukast (SINGULAIR) 10 MG tablet Take 10 mg by mouth at bedtime.    Immunization History  Administered Date(s) Administered   [employer] Vaccination 06/13/2019, 07/11/2019, 02/27/2020   PFIZER Comirnaty(Gray Top)Covid-19 Tri-Sucrose Vaccine 07/28/2020   Pneumococcal Conjugate-13 04/06/2015   Tdap 03/14/2005, 10/08/2018   Zoster Recombinat (Shingrix) 10/08/2018   Zoster, Live 03/14/2012       Objective:   Physical Exam BP 104/62 (BP Location: Left Arm, Patient Position: Sitting, Cuff Size: Normal)    Pulse 86    Temp (!) 97.3 F (36.3 C) (Oral)    Ht 5\' 1"  (1.549 m)    Wt 165 lb 12.8 oz (75.2 kg)    SpO2 96%    BMI 31.33 kg/m  GENERAL: Well-developed somewhat overweight woman, no acute distress.  Ambulatory with assistance of a walker.  No conversational dyspnea. HEAD: Normocephalic, atraumatic.  EYES: Pupils equal, round, reactive to light.  No scleral icterus.  MOUTH: She does have evidence of thrush in the posterior pharynx. NECK: Supple. No thyromegaly. Trachea midline. No JVD.  No adenopathy. PULMONARY: Good air entry bilaterally.  No adventitious sounds. CARDIOVASCULAR: S1 and S2. Regular rate and rhythm.  No rubs, murmurs or gallops heard. ABDOMEN: Obese otherwise benign. MUSCULOSKELETAL: No joint deformity, no clubbing, no edema.  NEUROLOGIC: No overt focal deficit, no gait disturbance, speech is fluent. SKIN: Intact,warm,dry. PSYCH: Mood and behavior normal.  Ambulatory oximetry: Patient  did not exhibit  desaturations during ambulation.  Oxygen saturations remained between 96 to 98% throughout the entire test.  Patient ambulated at a moderate pace with walker.  Representative images from CT scan of the chest performed 24 March 2021 showing areas of adenopathy (arrows):     Representative image from PET/CT performed 12 April 2021 showing hypermetabolism on the above noted lymphadenopathy:       Assessment & Plan:     ICD-10-CM   1. Mediastinal adenopathy  R59.0    She will need EBUS/TBNA for diagnosis Are scheduled for 14 May 2021 at 12 PM    2. Malignant neoplasm of upper lobe of left lung (HCC)  C34.12    Adenocarcinoma stage III diagnosed 2020 Mediastinal adenopathy likely due to recurrence    3. COPD, moderate (HCC)  J44.9    Trial of Stiolto, 2 inhalations daily Discontinue budesonide due to thrush Use DuoNeb only for rescue only    4. Thrush of mouth and esophagus (HCC)  B37.81    B37.0    Fluconazole 100 mg daily x10 days    5. Fatigue, unspecified type  R53.83    Her "dyspnea" is mostly fatigue Ambulatory oximetry today was nonrevealing May be issue with recurrence of malignancy     Meds ordered this encounter  Medications   Tiotropium Bromide-Olodaterol (STIOLTO RESPIMAT) 2.5-2.5 MCG/ACT AERS    Sig: Inhale 2 puffs into the lungs daily.    Dispense:  4 g    Refill:  0    Order Specific Question:   Lot Number?    Answer:   [id number]    Order Specific Question:   Expiration Date?    Answer:   06/12/2023    Order Specific Question:   Quantity    Answer:   2   fluconazole (DIFLUCAN) 100 MG tablet    Sig: Take 1 tablet (100 mg total) by mouth daily for 14 days.    Dispense:  14 tablet    Refill:  0   Benefits, limitations and potential complications of the procedure (EBUS/TBNA) were discussed with the patient/family.  Complications from bronchoscopy are rare and most often minor, but if they occur they may include breathing difficulty, vocal cord spasm,  hoarseness, slight fever, vomiting, dizziness, bronchospasm, infection, low blood oxygen, bleeding from biopsy site, or an allergic reaction to medications.  It is uncommon for patients to experience other more serious complications for example: Collapsed lung requiring chest tube placement, respiratory failure, heart attack and/or cardiac arrhythmia.  Patient understood, agrees to proceed.  Procedure is tentatively scheduled for 14 May 2021 12 noon.  See the patient in follow-up in 4 to 6 weeks time she is to call sooner should any new problems arise.   Renold Don, MD Advanced Bronchoscopy PCCM [facility]    *This note was dictated using voice recognition software/Dragon.  Despite best efforts to proofread, errors can occur which can change the meaning. Any transcriptional errors that result from this process are unintentional and may not be fully corrected at the time of dictation.

## 2021-05-03 NOTE — Telephone Encounter (Signed)
EBUS scheduled 05/14/2021 at 12:00. [place]:[id number] nodule [id number], [place], please see bronch info.

## 2021-05-03 NOTE — Telephone Encounter (Signed)
Phone pre admit visit 05/07/2021 between 8-1 and covid test 05/12/2021 between 8-12.  Patient is aware of above dates/times and voiced her understanding.  Nothing further needed at this time.

## 2021-05-03 NOTE — H&P (View-Only) (Signed)
Subjective:    Patient ID: Jennifer Hodges, female    DOB: 07-01-1945, 76 y.o.   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  She presents for evaluation of potential EBUS with TBNA.  She is kindly referred by Dr. Wallene Huh, her primary care physician is Dr. Thereasa Distance.  With regards to her adenocarcinoma, she has been followed with serial chest CTs.  In October 2022 she had left upper lobe lung ectasis due to apparent obstruction from postradiation fibrosis causing pneumonia.  Subsequently in January 2023 she had follow-up CT that showed resolution of the atelectatic changers on the left however adenopathy in the precarinal and right hilar areas appeared enlarged this was followed by a PET/CT on January 30 which showed activity on the precarinal lymph node as well as the right hilar lymph nodes.  Patient also has been having some issues with dyspnea that she describes as "running out of gas".  This is actually more an issue of fatigue.  She has been on DuoNeb's and Pulmicort via nebulizer.  She has noted since starting on the Pulmicort that she has developed a sore throat and has white patches on the back of her throat.  She is not rinsing her mouth after use of the Pulmicort via nebulizer.  She does not note that the nebulizers help her any with her sensation of fatigue.  She had a 2D echo on 23 January that showed EF of 55 to 60% mild left ventricular hypertrophy and grade 1 diastolic dysfunction.  Is really no  evidence of significant valvular disease.  She has not had any fevers, chills or sweats.  No weight loss or anorexia.  No sputum production or hemoptysis.  She has not had any chest pain.  No lower extremity edema.  No calf tenderness.  She does not endorse any other symptomatology.   Review of Systems A 10 point review of systems was performed and it is as noted above otherwise negative.  Past Medical History:  Diagnosis Date   Anemia    Asthma    No Inhalers--Dr. [name] will order as needed   Bronchiectasis ([place])    mild   Chronic headaches     followed by [facility] migraines   COPD (chronic obstructive pulmonary disease) (HCC)    DDD (degenerative disc disease), lumbar    Diverticulosis    Family history of adverse reaction to anesthesia    PONV   Gall stones    history of   GERD (gastroesophageal reflux disease)    EGD 8/09- non bleeding erosive gastritis, documentd esophageal ulcerations.    Hiatal hernia    History of pneumonia    Hypercholesterolemia    IBS (irritable bowel syndrome)    Malignant neoplasm of upper lobe of left lung ([place]) 12/27/2018   Meniere disease    Murmur    Osteoarthritis    lumbar disc disease, left hip   Personal history of chemotherapy    Personal history of radiation therapy    PONV (postoperative nausea and vomiting)    "  Only with last hip and I believe it was due to the morphine"    Sleep apnea    cpap asked to bring mask and tubing   Vertigo    Weakness of right side of body    Past Surgical History:  Procedure Laterality Date   ABDOMINAL HYSTERECTOMY  age 70   ANTERIOR CERVICAL DECOMP/DISCECTOMY FUSION N/A 02/24/2015   Procedure: CERVICAL FOUR-FIVE, CERVICAL FIVE-SIX, CERVICAL SIX-SEVEN ANTERIOR CERVICAL DECOMPRESSION/DISCECTOMY FUSION ;  Surgeon: [name], MD;  Location: [facility];  Service: Neurosurgery;  Laterality: N/A;  C45 C56 C67 anterior cervical decompression with fusion interbody prosthesis plating and  bonegraft   APPENDECTOMY     BACK SURGERY     4th lumbar fusion   BLADDER SURGERY N/A    with vaginal wall repair   BREAST CYST ASPIRATION Bilateral    neg   BREAST SURGERY Bilateral    cyst removed and reduction   CARDIAC CATHETERIZATION  2014   CARPAL TUNNEL RELEASE Right 02/11/2016   Procedure: CARPAL TUNNEL RELEASE;  Surgeon: [name], MD;  Location: [facility];  Service: Orthopedics;  Laterality: Right;   CATARACT EXTRACTION W/ INTRAOCULAR LENS IMPLANT Bilateral 2015   CHOLECYSTECTOMY     EUS N/A 05/31/2012   Procedure: UPPER ENDOSCOPIC ULTRASOUND (EUS) LINEAR;  Surgeon: [name], MD;  Location: [facility];  Service: Endoscopy;  Laterality: N/A;   EXCISIONAL HEMORRHOIDECTOMY     JOINT REPLACEMENT Bilateral    KNEE ARTHROSCOPY WITH LATERAL MENISECTOMY Right 07/07/2015   Procedure: KNEE ARTHROSCOPY WITH LATERAL MENISECTOMY, PARTIAL SYNOVECTOMY;  Surgeon: [name], MD;  Location: [facility];  Service: Orthopedics;  Laterality: Right;   LUMBAR LAMINECTOMY     PORTA CATH INSERTION N/A 01/07/2019   Procedure: PORTA CATH INSERTION;  Surgeon: [name], MD;  Location: [facility];  Service: Cardiovascular;  Laterality: N/A;   REDUCTION MAMMAPLASTY  1990   RIGHT OOPHORECTOMY     TOTAL HIP ARTHROPLASTY Left 05/01/2014   Dr. [name]   TOTAL HIP ARTHROPLASTY Right 08/05/2014   Procedure: TOTAL HIP ARTHROPLASTY ANTERIOR APPROACH;  Surgeon: [name], MD;  Location: [facility];  Service: Orthopedics;  Laterality: Right;   ULNAR NERVE TRANSPOSITION Right 02/11/2016   Procedure: ULNAR NERVE DECOMPRESSION/TRANSPOSITION;  Surgeon: [name], MD;  Location: [facility];  Service: Orthopedics;  Laterality: Right;   VIDEO BRONCHOSCOPY WITH ENDOBRONCHIAL ULTRASOUND Left 12/19/2018   Procedure: VIDEO BRONCHOSCOPY WITH ENDOBRONCHIAL ULTRASOUND, LEFT, SLEEP APNEA;  Surgeon: [name], MD;  Location: [facility];  Service: Thoracic;  Laterality: Left;   Patient Active  Problem List   Diagnosis Date Noted   Postobstructive pneumonia 01/06/2021   Benign breast lumps 01/11/2019   Gout 01/11/2019   Hives 01/11/2019   Lung disease 01/11/2019   Migraine headache 01/11/2019   Goals of care, counseling/discussion 12/27/2018   Malignant neoplasm of upper lobe of left lung ([place]) 12/27/2018   Night sweats 08/07/2016   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   Osteopenia of multiple sites 04/27/2016   Left carpal tunnel syndrome 02/02/2016   Carotid stenosis, asymptomatic, bilateral 12/29/2015   Prediabetes 09/28/2015   Primary osteoarthritis involving multiple joints 09/28/2015   Knee pain 05/11/2015   Chest tightness 03/24/2015   Stool incontinence 03/24/2015   Urinary frequency 03/04/2015   Cervical spondylosis with radiculopathy 02/24/2015   Pre-syncope 02/19/2015   URI (upper respiratory infection) 02/15/2015   Neck pain 12/25/2014   Pelvic pain in female 11/16/2014   Primary osteoarthritis of hip 08/05/2014  Heel pain 06/01/2014   Diarrhea 06/01/2014   Health care maintenance 06/01/2014   Right leg pain 03/30/2014   Right arm pain 03/30/2014   Sinusitis 03/30/2014   Internal nasal lesion 11/12/2013   Other specified disorders of nose and nasal sinuses 11/12/2013   Chronic tension-type headache, intractable 10/30/2013   Intractable migraine without aura and without status migrainosus 10/30/2013   Occipital neuralgia of left side 10/30/2013   IBS (irritable bowel syndrome) 09/03/2013   Rib pain 08/04/2013   Cervical radiculitis 08/01/2013   Lumbar radiculitis 08/01/2013   Palpitations 07/25/2013   Benign essential hypertension 05/28/2013   Neuralgia, neuritis, and radiculitis, unspecified 05/28/2013   Vitamin D deficiency 05/28/2013   Elevated AFP 04/28/2013   Abnormality of alpha-fetoprotein 04/28/2013   Osteoporosis 11/12/2012   Incomplete emptying of bladder 07/11/2012   Prolapse of vaginal vault after hysterectomy 07/11/2012    Nonspecific (abnormal) findings on radiological and other examination of gastrointestinal tract 05/31/2012   Bronchiectasis ([place]) 02/05/2012   Sleep apnea 02/05/2012   Degenerative disc disease, cervical 02/05/2012   Hypercholesterolemia 02/05/2012   GERD (gastroesophageal reflux disease) 02/05/2012   Chronic headaches 02/05/2012   Family History  Problem Relation Age of Onset   Heart disease Mother        s/p stent   Hypertension Mother    Hypercholesterolemia Mother    Diabetes Father    Stomach cancer Other        uncle   Breast cancer Cousin    Breast cancer Paternal Aunt    Social History   Tobacco Use   Smoking status: Former    Packs/day: 1.00    Years: 40.00    Pack years: 40.00    Types: Cigarettes    Quit date: 06/13/2007    Years since quitting: 13.8   Smokeless tobacco: Never  Substance Use Topics   Alcohol use: No    Alcohol/week: 0.0 standard drinks   Current Meds  Medication Sig   albuterol (PROVENTIL) (2.5 MG/3ML) 0.083% nebulizer solution Take 2.5 mg by nebulization every 6 (six) hours as needed for wheezing.   albuterol (VENTOLIN HFA) 108 (90 Base) MCG/ACT inhaler Inhale 2 puffs into the lungs every 6 (six) hours as needed for wheezing or shortness of breath.   amoxicillin-clavulanate (AUGMENTIN) 500-125 MG tablet Take 1 tablet by mouth 3 (three) times daily.   Bioflavonoid Products (ESTER C PO) Take 1 tablet by mouth 3 (three) times daily.   budesonide (PULMICORT) 0.5 MG/2ML nebulizer solution Take by nebulization.   Calcium-Magnesium-Zinc (CAL-MAG-ZINC PO) Take 1 tablet by mouth daily.   cetirizine (ZYRTEC) 5 MG tablet Take 1 tablet (5 mg total) by mouth daily.   CHLORASEPTIC MAX SORE THROAT 1.5-33 % LIQD Use as directed 1 spray in the mouth or throat as needed (throat irritation.).    Cholecalciferol (EQL VITAMIN D3) 25 MCG (1000 UT) capsule Take 4,000 Units by mouth daily.   Clotrimazole 1 % OINT Apply 1 application topically in the morning, at noon,  and at bedtime.   Coenzyme Q10 (CO Q 10) 100 MG CAPS Take 100 mg by mouth daily.    Cyanocobalamin (VITAMIN B-12) 2500 MCG SUBL Place 2,500 mcg under the tongue daily.   cyclobenzaprine (FLEXERIL) 10 MG tablet Take 10 mg by mouth at bedtime.   DHEA 25 MG CAPS Take 25 mg by mouth daily.   Ensure (ENSURE) Take 237 mLs by mouth 2 (two) times daily between meals.   ezetimibe (ZETIA) 10 MG tablet Take 5 mg  by mouth in the morning and at bedtime.    fluconazole (DIFLUCAN) 100 MG tablet Take 1 tablet (100 mg total) by mouth daily for 14 days.   folic acid (FOLVITE) 235 MCG tablet Take 400 mcg by mouth 2 (two) times daily.    Garlic 5732 MG CAPS Take 1,000 mg by mouth daily.   guaiFENesin-dextromethorphan (ROBITUSSIN DM) 100-10 MG/5ML syrup Take 5 mLs by mouth every 4 (four) hours as needed for cough.   Histamine Dihydrochloride (AUSTRALIAN DREAM ARTHRITIS EX) Apply 1 application topically 4 (four) times daily as needed (pain.).   HYDROcodone-acetaminophen (NORCO/VICODIN) 5-325 MG tablet 1-2 tabs po bid prn (Patient taking differently: Take 1 tablet by mouth at bedtime.)   ipratropium (ATROVENT) 0.03 % nasal spray SMARTSIG:1-2 Spray(s) Both Nares 3 Times Daily PRN   ipratropium-albuterol (DUONEB) 0.5-2.5 (3) MG/3ML SOLN Inhale into the lungs.   lidocaine-prilocaine (EMLA) cream lidocaine-prilocaine 2.5 %-2.5 % topical cream   meclizine (ANTIVERT) 25 MG tablet Take 1 tablet (25 mg total) by mouth every 6 (six) hours as needed for dizziness.   ondansetron (ZOFRAN-ODT) 4 MG disintegrating tablet Take 4 mg by mouth every 8 (eight) hours as needed for nausea/vomiting.   Polyethyl Glycol-Propyl Glycol (SYSTANE) 0.4-0.3 % SOLN Place 1 drop into both eyes 5 (five) times daily as needed (dry/irritated eyes).    pyridOXINE (VITAMIN B-6) 100 MG tablet Take 100 mg by mouth daily.   Selenium 200 MCG CAPS Take 200 mcg by mouth daily.   sucralfate (CARAFATE) 1 g tablet 1 tablet 3 (three) times daily as needed.     Tiotropium Bromide-Olodaterol (STIOLTO RESPIMAT) 2.5-2.5 MCG/ACT AERS Inhale 2 puffs into the lungs daily.   triamcinolone (KENALOG) 0.1 % Apply 1 application topically 2 (two) times daily.   UBRELVY 100 MG TABS Take by mouth.   Vitamin A 2250 MCG (7500 UT) CAPS Take 1 capsule by mouth daily.    vitamin E 400 UNIT capsule Take 400 Units by mouth daily.   Zoledronic Acid (RECLAST IV) Inject 1 Dose into the vein as directed. ONCE A YEAR   [DISCONTINUED] montelukast (SINGULAIR) 10 MG tablet Take 10 mg by mouth at bedtime.    Immunization History  Administered Date(s) Administered   [employer] Vaccination 06/13/2019, 07/11/2019, 02/27/2020   PFIZER Comirnaty(Gray Top)Covid-19 Tri-Sucrose Vaccine 07/28/2020   Pneumococcal Conjugate-13 04/06/2015   Tdap 03/14/2005, 10/08/2018   Zoster Recombinat (Shingrix) 10/08/2018   Zoster, Live 03/14/2012       Objective:   Physical Exam BP 104/62 (BP Location: Left Arm, Patient Position: Sitting, Cuff Size: Normal)    Pulse 86    Temp (!) 97.3 F (36.3 C) (Oral)    Ht 5\' 1"  (1.549 m)    Wt 165 lb 12.8 oz (75.2 kg)    SpO2 96%    BMI 31.33 kg/m  GENERAL: Well-developed somewhat overweight woman, no acute distress.  Ambulatory with assistance of a walker.  No conversational dyspnea. HEAD: Normocephalic, atraumatic.  EYES: Pupils equal, round, reactive to light.  No scleral icterus.  MOUTH: She does have evidence of thrush in the posterior pharynx. NECK: Supple. No thyromegaly. Trachea midline. No JVD.  No adenopathy. PULMONARY: Good air entry bilaterally.  No adventitious sounds. CARDIOVASCULAR: S1 and S2. Regular rate and rhythm.  No rubs, murmurs or gallops heard. ABDOMEN: Obese otherwise benign. MUSCULOSKELETAL: No joint deformity, no clubbing, no edema.  NEUROLOGIC: No overt focal deficit, no gait disturbance, speech is fluent. SKIN: Intact,warm,dry. PSYCH: Mood and behavior normal.  Ambulatory oximetry: Patient  did not exhibit  desaturations during ambulation.  Oxygen saturations remained between 96 to 98% throughout the entire test.  Patient ambulated at a moderate pace with walker.  Representative images from CT scan of the chest performed 24 March 2021 showing areas of adenopathy (arrows):     Representative image from PET/CT performed 12 April 2021 showing hypermetabolism on the above noted lymphadenopathy:       Assessment & Plan:     ICD-10-CM   1. Mediastinal adenopathy  R59.0    She will need EBUS/TBNA for diagnosis Are scheduled for 14 May 2021 at 12 PM    2. Malignant neoplasm of upper lobe of left lung (HCC)  C34.12    Adenocarcinoma stage III diagnosed 2020 Mediastinal adenopathy likely due to recurrence    3. COPD, moderate (HCC)  J44.9    Trial of Stiolto, 2 inhalations daily Discontinue budesonide due to thrush Use DuoNeb only for rescue only    4. Thrush of mouth and esophagus (HCC)  B37.81    B37.0    Fluconazole 100 mg daily x10 days    5. Fatigue, unspecified type  R53.83    Her "dyspnea" is mostly fatigue Ambulatory oximetry today was nonrevealing May be issue with recurrence of malignancy     Meds ordered this encounter  Medications   Tiotropium Bromide-Olodaterol (STIOLTO RESPIMAT) 2.5-2.5 MCG/ACT AERS    Sig: Inhale 2 puffs into the lungs daily.    Dispense:  4 g    Refill:  0    Order Specific Question:   Lot Number?    Answer:   [id number]    Order Specific Question:   Expiration Date?    Answer:   06/12/2023    Order Specific Question:   Quantity    Answer:   2   fluconazole (DIFLUCAN) 100 MG tablet    Sig: Take 1 tablet (100 mg total) by mouth daily for 14 days.    Dispense:  14 tablet    Refill:  0   Benefits, limitations and potential complications of the procedure (EBUS/TBNA) were discussed with the patient/family.  Complications from bronchoscopy are rare and most often minor, but if they occur they may include breathing difficulty, vocal cord spasm,  hoarseness, slight fever, vomiting, dizziness, bronchospasm, infection, low blood oxygen, bleeding from biopsy site, or an allergic reaction to medications.  It is uncommon for patients to experience other more serious complications for example: Collapsed lung requiring chest tube placement, respiratory failure, heart attack and/or cardiac arrhythmia.  Patient understood, agrees to proceed.  Procedure is tentatively scheduled for 14 May 2021 12 noon.  See the patient in follow-up in 4 to 6 weeks time she is to call sooner should any new problems arise.   Renold Don, MD Advanced Bronchoscopy PCCM [facility]    *This note was dictated using voice recognition software/Dragon.  Despite best efforts to proofread, errors can occur which can change the meaning. Any transcriptional errors that result from this process are unintentional and may not be fully corrected at the time of dictation.

## 2021-05-03 NOTE — Telephone Encounter (Signed)
Patient has Medicare and T[employer]are Prior Auth Not Required for the codes [phone], [phone]

## 2021-05-03 NOTE — Addendum Note (Signed)
Addended by: Tyler Pita on: 05/03/2021 05:11 PM   Modules accepted: Orders

## 2021-05-07 ENCOUNTER — Encounter
Admission: RE | Admit: 2021-05-07 | Discharge: 2021-05-07 | Disposition: A | Discharge status: Home / Self Care | Payer: Medicare Other | Source: Ambulatory Visit | Attending: Pulmonary Disease | Admitting: Pulmonary Disease

## 2021-05-07 ENCOUNTER — Telehealth: Payer: Self-pay | Admitting: Pulmonary Disease

## 2021-05-07 ENCOUNTER — Other Ambulatory Visit: Payer: Self-pay

## 2021-05-07 HISTORY — DX: Personal history of urinary calculi: Z87.442

## 2021-05-07 NOTE — Telephone Encounter (Signed)
Spoke to patient. She stated that pre admit testing called her.  Will close encounter, as nothing further needed at this time.

## 2021-05-07 NOTE — Telephone Encounter (Signed)
Patient stated that she has not been contacted for pre admit testing admitting.  Per Joellen Jersey with Pre admit testing, the provider is running behind. Patient will be contacted today.  Lm to make patient aware.

## 2021-05-07 NOTE — Patient Instructions (Signed)
Your procedure is scheduled on:05-14-21 Friday Report to the Registration Desk on the 1st floor of the [facility].Then proceed to the 2nd floor Surgery Desk in the [facility] To find out your arrival time, please call [phone] between 1PM - 3PM on:05-13-21 Thursday  REMEMBER: Instructions that are not followed completely may result in serious medical risk, up to and including death; or upon the discretion of your surgeon and anesthesiologist your surgery may need to be rescheduled.  Do not eat food after midnight the night before surgery.  No gum chewing, lozengers or hard candies.  You may however, drink CLEAR liquids up to 2 hours before you are scheduled to arrive for your surgery. Do not drink anything within 2 hours of your scheduled arrival time.  Clear liquids include: - water  - apple juice without pulp - gatorade (not RED colors) - black coffee or tea (Do NOT add milk or creamers to the coffee or tea) Do NOT drink anything that is not on this list.  TAKE THESE MEDICATIONS THE MORNING OF SURGERY WITH A SIP OF WATER: -pantoprazole (PROTONIX) -ezetimibe (ZETIA)  Use your STIOLTO inhaler and albuterol (VENTOLIN HFA)  inhaler the day of surgery and bring your Albuterol Inhaler to the hospital  One week prior to surgery: Stop Anti-inflammatories (NSAIDS) such as Advil, Aleve, Ibuprofen, Motrin, Naproxen, Naprosyn and Aspirin based products such as Excedrin, Goodys Powder, BC Powder.You may however, continue to take Tylenol if needed for pain up until the day of surgery.  Stop ANY OVER THE COUNTER /vitamins NOW (05-07-21) until after surgery (ESTER C, CAL-MAG-ZINC, VITAMIN D3, CO Q 10, VITAMIN W-58, DHEA , folic acid, Garlic, L-CARNITINE, VITAMIN B-6, Selenium , Vitamin A , Vitamin E )  No Alcohol for 24 hours before or after surgery.  No Smoking including e-cigarettes for 24 hours prior to surgery.  No chewable tobacco products for at least 6 hours prior to surgery.  No  nicotine patches on the day of surgery.  Do not use any "recreational" drugs for at least a week prior to your surgery.  Please be advised that the combination of cocaine and anesthesia may have negative outcomes, up to and including death. If you test positive for cocaine, your surgery will be cancelled.  On the morning of surgery brush your teeth with toothpaste and water, you may rinse your mouth with mouthwash if you wish. Do not swallow any toothpaste or mouthwash.  Do not wear jewelry, make-up, hairpins, clips or nail polish.  Do not wear lotions, powders, or perfumes.   Do not shave body from the neck down 48 hours prior to surgery just in case you cut yourself which could leave a site for infection.  Also, freshly shaved skin may become irritated if using the CHG soap.  Contact lenses, hearing aids and dentures may not be worn into surgery.  Do not bring valuables to the hospital. [facility] is not responsible for any missing/lost belongings or valuables.   Notify your doctor if there is any change in your medical condition (cold, fever, infection).  Wear comfortable clothing (specific to your surgery type) to the hospital.  After surgery, you can help prevent lung complications by doing breathing exercises.  Take deep breaths and cough every 1-2 hours. Your doctor may order a device called an Incentive Spirometer to help you take deep breaths. When coughing or sneezing, hold a pillow firmly against your incision with both hands. This is called splinting. Doing this helps protect your  incision. It also decreases belly discomfort.  If you are being admitted to the hospital overnight, leave your suitcase in the car. After surgery it may be brought to your room.  If you are being discharged the day of surgery, you will not be allowed to drive home. You will need a responsible adult (18 years or older) to drive you home and stay with you that night.   If you are taking public  transportation, you will need to have a responsible adult (18 years or older) with you. Please confirm with your physician that it is acceptable to use public transportation.   Please call the [facility]. at [phone] if you have any questions about these instructions.  Surgery Visitation Policy:  Patients undergoing a surgery or procedure may have one family member or support person with them as long as that person is not COVID-19 positive or experiencing its symptoms.  That person may remain in the waiting area during the procedure and may rotate out with other people.  Inpatient Visitation:    Visiting hours are 7 a.m. to 8 p.m. Up to two visitors ages 16+ are allowed at one time in a patient room. The visitors may rotate out with other people during the day. Visitors must check out when they leave, or other visitors will not be allowed. One designated support person may remain overnight. The visitor must pass COVID-19 screenings, use hand sanitizer when entering and exiting the patients room and wear a mask at all times, including in the patients room. Patients must also wear a mask when staff or their visitor are in the room. Masking is required regardless of vaccination status.

## 2021-05-11 ENCOUNTER — Telehealth: Payer: Self-pay

## 2021-05-11 NOTE — Telephone Encounter (Signed)
Erroneous encounter

## 2021-05-12 ENCOUNTER — Other Ambulatory Visit
Admission: RE | Admit: 2021-05-12 | Discharge: 2021-05-12 | Disposition: A | Discharge status: Home / Self Care | Payer: Medicare Other | Source: Ambulatory Visit | Attending: Pulmonary Disease | Admitting: Pulmonary Disease

## 2021-05-12 ENCOUNTER — Other Ambulatory Visit: Payer: Self-pay

## 2021-05-12 DIAGNOSIS — Z20822 Contact with and (suspected) exposure to covid-19: Secondary | ICD-10-CM | POA: Diagnosis not present

## 2021-05-12 DIAGNOSIS — Z01812 Encounter for preprocedural laboratory examination: Secondary | ICD-10-CM | POA: Diagnosis present

## 2021-05-12 LAB — SARS CORONAVIRUS 2 (TAT 6-24 HRS): SARS Coronavirus 2: NEGATIVE

## 2021-05-14 ENCOUNTER — Other Ambulatory Visit: Payer: Self-pay

## 2021-05-14 ENCOUNTER — Encounter: Payer: Self-pay | Admitting: Pulmonary Disease

## 2021-05-14 ENCOUNTER — Encounter
Admission: RE | Disposition: A | Discharge status: Home / Self Care | Payer: Self-pay | Source: Home / Self Care | Attending: Pulmonary Disease

## 2021-05-14 ENCOUNTER — Ambulatory Visit
Admission: RE | Admit: 2021-05-14 | Discharge: 2021-05-14 | Disposition: A | Discharge status: Home / Self Care | Payer: Medicare Other | Attending: Pulmonary Disease | Admitting: Pulmonary Disease

## 2021-05-14 ENCOUNTER — Ambulatory Visit: Payer: Medicare Other | Admitting: Certified Registered Nurse Anesthetist

## 2021-05-14 DIAGNOSIS — J449 Chronic obstructive pulmonary disease, unspecified: Secondary | ICD-10-CM | POA: Insufficient documentation

## 2021-05-14 DIAGNOSIS — R590 Localized enlarged lymph nodes: Secondary | ICD-10-CM

## 2021-05-14 DIAGNOSIS — Z923 Personal history of irradiation: Secondary | ICD-10-CM | POA: Insufficient documentation

## 2021-05-14 DIAGNOSIS — C3412 Malignant neoplasm of upper lobe, left bronchus or lung: Secondary | ICD-10-CM | POA: Diagnosis not present

## 2021-05-14 DIAGNOSIS — R112 Nausea with vomiting, unspecified: Secondary | ICD-10-CM | POA: Diagnosis not present

## 2021-05-14 DIAGNOSIS — R1310 Dysphagia, unspecified: Secondary | ICD-10-CM | POA: Insufficient documentation

## 2021-05-14 DIAGNOSIS — R0609 Other forms of dyspnea: Secondary | ICD-10-CM | POA: Diagnosis not present

## 2021-05-14 DIAGNOSIS — K219 Gastro-esophageal reflux disease without esophagitis: Secondary | ICD-10-CM | POA: Diagnosis not present

## 2021-05-14 DIAGNOSIS — Z87891 Personal history of nicotine dependence: Secondary | ICD-10-CM | POA: Insufficient documentation

## 2021-05-14 DIAGNOSIS — K449 Diaphragmatic hernia without obstruction or gangrene: Secondary | ICD-10-CM | POA: Diagnosis not present

## 2021-05-14 DIAGNOSIS — J189 Pneumonia, unspecified organism: Secondary | ICD-10-CM | POA: Diagnosis not present

## 2021-05-14 DIAGNOSIS — Z9889 Other specified postprocedural states: Secondary | ICD-10-CM

## 2021-05-14 DIAGNOSIS — I358 Other nonrheumatic aortic valve disorders: Secondary | ICD-10-CM | POA: Diagnosis not present

## 2021-05-14 DIAGNOSIS — Z9221 Personal history of antineoplastic chemotherapy: Secondary | ICD-10-CM | POA: Diagnosis not present

## 2021-05-14 DIAGNOSIS — C3492 Malignant neoplasm of unspecified part of left bronchus or lung: Secondary | ICD-10-CM

## 2021-05-14 DIAGNOSIS — C771 Secondary and unspecified malignant neoplasm of intrathoracic lymph nodes: Secondary | ICD-10-CM | POA: Insufficient documentation

## 2021-05-14 DIAGNOSIS — G4730 Sleep apnea, unspecified: Secondary | ICD-10-CM | POA: Diagnosis not present

## 2021-05-14 HISTORY — PX: VIDEO BRONCHOSCOPY WITH ENDOBRONCHIAL ULTRASOUND: SHX6177

## 2021-05-14 SURGERY — BRONCHOSCOPY, WITH EBUS
Anesthesia: General | Laterality: Right

## 2021-05-14 MED ORDER — DEXAMETHASONE SODIUM PHOSPHATE 10 MG/ML IJ SOLN
INTRAMUSCULAR | Status: DC | PRN
  Administered 2021-05-14: 10 mg via INTRAVENOUS

## 2021-05-14 MED ORDER — FENTANYL CITRATE (PF) 100 MCG/2ML IJ SOLN
INTRAMUSCULAR | Status: AC
  Filled 2021-05-14: qty 2

## 2021-05-14 MED ORDER — SUGAMMADEX SODIUM 200 MG/2ML IV SOLN
INTRAVENOUS | Status: DC | PRN
  Administered 2021-05-14: 200 mg via INTRAVENOUS

## 2021-05-14 MED ORDER — ONDANSETRON HCL 4 MG/2ML IJ SOLN
INTRAMUSCULAR | Status: DC | PRN
Start: 2021-05-14 — End: 2021-05-14
  Administered 2021-05-14: 4 mg via INTRAVENOUS

## 2021-05-14 MED ORDER — PROPOFOL 10 MG/ML IV BOLUS
INTRAVENOUS | Status: DC | PRN
  Administered 2021-05-14: 100 mg via INTRAVENOUS

## 2021-05-14 MED ORDER — LACTATED RINGERS IV SOLN: INTRAVENOUS | Status: DC

## 2021-05-14 MED ORDER — IPRATROPIUM-ALBUTEROL 0.5-2.5 (3) MG/3ML IN SOLN
RESPIRATORY_TRACT | Status: AC
  Administered 2021-05-14: 3 mL via RESPIRATORY_TRACT
  Filled 2021-05-14: qty 3

## 2021-05-14 MED ORDER — ORAL CARE MOUTH RINSE: 15.0000 mL | Freq: Once | OROMUCOSAL | Status: AC

## 2021-05-14 MED ORDER — ROCURONIUM BROMIDE 100 MG/10ML IV SOLN
INTRAVENOUS | Status: DC | PRN
Start: 2021-05-14 — End: 2021-05-14
  Administered 2021-05-14: 20 mg via INTRAVENOUS
  Administered 2021-05-14: 30 mg via INTRAVENOUS

## 2021-05-14 MED ORDER — FENTANYL CITRATE (PF) 100 MCG/2ML IJ SOLN
INTRAMUSCULAR | Status: DC | PRN
  Administered 2021-05-14 (×2): 50 ug via INTRAVENOUS

## 2021-05-14 MED ORDER — PHENYLEPHRINE 40 MCG/ML (10ML) SYRINGE FOR IV PUSH (FOR BLOOD PRESSURE SUPPORT)
PREFILLED_SYRINGE | INTRAVENOUS | Status: DC | PRN
Start: 2021-05-14 — End: 2021-05-14
  Administered 2021-05-14 (×5): 80 ug via INTRAVENOUS

## 2021-05-14 MED ORDER — APREPITANT 40 MG PO CAPS
ORAL_CAPSULE | ORAL | Status: AC
  Administered 2021-05-14: 40 mg via ORAL
  Filled 2021-05-14: qty 1

## 2021-05-14 MED ORDER — LIDOCAINE HCL (CARDIAC) PF 100 MG/5ML IV SOSY
PREFILLED_SYRINGE | INTRAVENOUS | Status: DC | PRN
  Administered 2021-05-14: 80 mg via INTRAVENOUS

## 2021-05-14 MED ORDER — SODIUM CHLORIDE 0.9 % IV SOLN: Freq: Once | INTRAVENOUS | Status: DC

## 2021-05-14 MED ORDER — IPRATROPIUM-ALBUTEROL 0.5-2.5 (3) MG/3ML IN SOLN: 3.0000 mL | RESPIRATORY_TRACT | Status: DC

## 2021-05-14 MED ORDER — CHLORHEXIDINE GLUCONATE 0.12 % MT SOLN
OROMUCOSAL | Status: AC
  Administered 2021-05-14: 15 mL via OROMUCOSAL
  Filled 2021-05-14: qty 15

## 2021-05-14 MED ORDER — CHLORHEXIDINE GLUCONATE 0.12 % MT SOLN: 15.0000 mL | Freq: Once | OROMUCOSAL | Status: AC

## 2021-05-14 MED ORDER — SODIUM CHLORIDE 0.9 % IV SOLN
INTRAVENOUS | Status: DC
Start: 2021-05-14 — End: 2021-05-14

## 2021-05-14 MED ORDER — APREPITANT 40 MG PO CAPS: 40.0000 mg | ORAL_CAPSULE | Freq: Once | ORAL | Status: AC

## 2021-05-14 NOTE — Op Note (Signed)
PROCEDURE: ENDOBRONCHIAL ULTRASOUND ? ? ?PROCEDURE DATE: 05/14/2021  TIME: 12:27 PM ?NAME:  Jennifer Hodges  DOB:July 05, 1945  MRN: [id number] ?LOC:  ARPO/None    HOSP DAY: N/A ? ? ? ?Indications/Preliminary Diagnosis:Adenopathy ? ?Consent: (Place X beside choice/s below) ? The benefits, risks and possible complications of the procedure were        explained to: ? _X__ patient ? ___ patient's family ? ___ other:___________  ?who verbalized understanding and gave: ? ___ verbal ? ___ written ? _X__ verbal and written ? ___ telephone ? ___ other:________ consent.  ?  ?  Unable to obtain consent; procedure performed on emergent basis.   ?  Other:   ? ?Benefits, limitations and potential complications of the procedure were discussed with the patient/family.  Complications from bronchoscopy are rare and most often minor, but if they occur they may include breathing difficulty, vocal cord spasm, hoarseness, slight fever, vomiting, dizziness, bronchospasm, infection, low blood oxygen, bleeding from biopsy site, or an allergic reaction to medications.  It is uncommon for patients to experience other more serious complications for example: Collapsed lung requiring chest tube placement, respiratory failure, heart attack and/or cardiac arrhythmia.  Patient agrees to proceed. ?  ? ?Anesthesia type: General endotracheal ? ?Surgeon: Renold Don, MD ?Assistant/Scrub: Sullivan Lone, RRT ?Anesthesiologist/CRNA: [name] Ouch, MD/Summer Lily Peer, CRNA ?Cytotechnology: [facility] ? ? ?PROCEDURE DETAILS: Patient was taken to Procedure Room 2 (Bronchoscopy Suite) in the OR area.  Appropriate Timeout performed and correct patient, name, ID and laterality confirmed.  Patient was inducted under general anesthesia and intubated with an 8.5 ET tube without difficulty.  Once the patient was under adequate general anesthesia a Portex adapter was placed in the ET tube flange.  Through the Portex adapter the Olympus video therapeutic bronchoscope  was then advanced.  The visible distal trachea was normal, carina appeared full anteriorly.  Examination of the right tracheobronchial tree showed scant clear secretions.  The right upper lobe , right middle lobe, lower lobe bronchi were free of endobronchial lesions or any other abnormality.  At this point bronchoscope was brought to the left mainstem and examination revealed no endobronchial lesions on the left tracheobronchial tree.  The left upper lobe and lingula did show some evidence of mild stenosis from prior radiation therapy.  There were significant inspissated purulent secretions on the left upper lobe and and these were lavaged till clear.  A BAL was performed on the left upper lobe and a sample was collected for culture and sensitivity.  BAL yield was approximately 6 mL there were mucous plugs also present.  After completing this, the bronchoscope was switched to an endobronchial ultrasound scope (EBUS scope) and the mediastinum was examined.  There was significant enlargement of a lymph node on the precarinal space measuring 2.5 x 1.5 cm, significant adenopathy on the RIGHT hilum, this was noted as a small cluster of lymph nodes averaging 1 cm in 0.8 cm in diameter respectively.  These lymph nodes were nestled between blood vessels and sampling was not deemed safe given small size and location adjacent to blood vessels.  No significant adenopathy was noted in the subcarinal area nor the left hilum 10.  We proceeded to sample the precarinal node.  ROSE revealed lesional cells.  A total of 8 passes were performed these were placed in preservative for further analysis.  Having completed this, the EBUS scope was retrieved and again replaced for the therapeutic video bronchoscope.  The airway was then examined thoroughly to  ensure adequate hemostasis.  After examination and inspection and noting excellent hemostasis, the patient received 9 mL of 1% lidocaine via bronchial lavage prior to retrieving the  bronchoscope.  At this point the procedure was terminated and the patient was allowed to emerge from general anesthesia.  Patient was extubated in the procedure room and transported to the PACU in satisfactory condition.  The patient did not have any complaints of chest pain or shortness of breath postprocedure.  Postprocedure examination shows symmetrical lung sounds. Patient tolerated the procedure well. ? ? ?SPECIMENS (Sites): (Place X beside choice below) ? Specimens Description  ? No Specimens Obtained    ? Washings   ?X Lavage LUL, for culture  ? Biopsies   ?X Fine Needle Aspirates Pre-carinal node x 8  ? Brushings   ? Sputum   ? ?FINDINGS:  ? ?Distorted architecture on the left upper lobe and lingula: ? ? ?Stenotic area of left upper lobe (post radiation): ? ? ?Right hilar cluster of lymph nodes, these are small, averaging 1 cm and 0.8 cm in diameter respectively nestled between blood vessels (large areas): ? ? ?Precarinal lymph node cluster, outlined in yellow: ? ? ?Another view of precarinal node: ? ? ?Sampling of the precarinal node EBUS needle within the node: ? ? ?ESTIMATED BLOOD LOSS: Approximately 2 mL ? ?COMPLICATIONS/RESOLUTION: none, patient followed clinically as procedure was done with ultrasound guidance. ? ? ? ?IMPRESSION:POST-PROCEDURE DX:  ?Mediastinal lymphadenopathy highly suspicious for recurrence ?Post radiation stenosis left upper lobe ?Retained purulent secretions, recent history of postobstructive pneumonia ? ? ?RECOMMENDATION/PLAN:  ?Culture and sensitivity of the secretions obtained, await micro data ?Await pathology report ?Acapella flutter valve for secretion management ? ? ? ? ?C. Derrill Kay, MD ?Advanced Bronchoscopy ?[facility] ? ? ? ?*This note was dictated using voice recognition software/Dragon.  Despite best efforts to proofread, errors can occur which can change the meaning.  Any change was purely unintentional. ?   ?

## 2021-05-14 NOTE — Anesthesia Preprocedure Evaluation (Addendum)
Anesthesia Evaluation  ?Patient identified by MRN, date of birth, ID band ?Patient awake ? ? ? ?Reviewed: ?Allergy & Precautions, NPO status , Patient's Chart, lab work & pertinent test results ? ?History of Anesthesia Complications ?(+) PONV and history of anesthetic complications (Only with last hip and I believe it was due to the morphine) ? ?Airway ?Mallampati: I ? ?TM Distance: >3 FB ?Neck ROM: full ? ? ? Dental ?no notable dental hx. ? ?  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  ?Pulmonary exam normal ? ? ? ? ? ? ? Cardiovascular ?Exercise Tolerance: Poor ?+ DOE  ?Normal cardiovascular exam ? ?2D echo on 23 January that showed EF of 55 to 60% mild left ventricular hypertrophy and grade 1 diastolic dysfunction ? ?ECHO 1/23: ?1. Left ventricular ejection fraction, by estimation, is 55 to 60%. The  ?left ventricle has normal function. The left ventricle has no regional  ?wall motion abnormalities. There is mild left ventricular hypertrophy.  ?Left ventricular diastolic parameters  ?are consistent with Grade I diastolic dysfunction (impaired relaxation).  ??2. Right ventricular systolic function is normal. The right ventricular  ?size is normal. Tricuspid regurgitation signal is inadequate for assessing  ?PA pressure.  ??3. The mitral valve is normal in structure. No evidence of mitral valve  ?regurgitation. No evidence of mitral stenosis.  ??4. The aortic valve is normal in structure. Aortic valve regurgitation is  ?not visualized. Aortic valve sclerosis/calcification is present, without  ?any evidence of aortic stenosis.  ?  ?Neuro/Psych ? Headaches, negative psych  ROS  ? GI/Hepatic ?Neg liver ROS, hiatal hernia, GERD  Medicated and Controlled,dysphagia ?  ?Endo/Other  ?negative endocrine ROS ? Renal/GU ?  ? ?  ?Musculoskeletal ? ? Abdominal ?Normal abdominal exam  (+)   ?Peds ? Hematology ?negative hematology ROS ?(+)   ?Anesthesia Other Findings ?Past Medical History: ?No date: Anemia ?No date: Asthma ?    Comment:  No Inhalers--Dr. [name] will order as needed ?No date: Bronchiectasis ([place]) ?    Comment:  mild ?No date: Chronic headaches ?    Comment:   followed by [facility] migraines ?No date: COPD (chronic obstructive pulmonary disease) ([place]) ?No date: DDD (degenerative disc disease), lumbar ?No date: Diverticulosis ?No date: Family history of adverse reaction to anesthesia ?    Comment:  mother and sisters-PONV ?No date: Gall stones ?    Comment:  history of ?No date: GERD (gastroesophageal reflux disease) ?    Comment:  EGD 8/09- non bleeding erosive gastritis, documentd  ?             esophageal ulcerations.  ?No date: Hiatal hernia ?    Comment:  small ?No date: History of kidney stones ?No date: History of pneumonia ?No date: Hypercholesterolemia ?No date: IBS (irritable bowel syndrome) ?12/27/2018: Malignant neoplasm of upper lobe of left lung ([place]) ?No date: Meniere disease ?No date: Murmur ?No date: Osteoarthritis ?    Comment:  lumbar disc disease, left hip ?No date: Personal history of chemotherapy ?No date: Personal history of radiation therapy ?12/2020: Pneumonia ?No date: PONV (postoperative nausea and vomiting) ?No date: Sleep apnea ?    Comment:  uses cpap ?No date: Vertigo ?No date: Weakness of right  side of body ? ?Past Surgical History: ?age 38: ABDOMINAL HYSTERECTOMY ?02/24/2015: ANTERIOR CERVICAL DECOMP/DISCECTOMY FUSION; N/A ?    Comment:  Procedure: CERVICAL FOUR-FIVE, CERVICAL FIVE-SIX,  ?             CERVICAL SIX-SEVEN ANTERIOR CERVICAL  ?             DECOMPRESSION/DISCECTOMY FUSION ;  Surgeon: Nena Polio  ?             Kathyrn Sheriff, MD;  Location:  [facility];  Service:  ?             Neurosurgery;  Laterality: N/A;  C45 C56 C67 anterior  ?             cervical decompression with fusion interbody prosthesis  ?             plating and bonegraft ?No date: APPENDECTOMY ?1988: BACK SURGERY ?    Comment:  4th lumbar fusion ?No date: BLADDER SURGERY; N/A ?    Comment:  with vaginal wall repair ?No date: BREAST CYST ASPIRATION; Bilateral ?    Comment:  neg ?No date: BREAST SURGERY; Bilateral ?    Comment:  cyst removed and reduction ?2014: CARDIAC CATHETERIZATION ?02/11/2016: CARPAL TUNNEL RELEASE; Right ?    Comment:  Procedure: CARPAL TUNNEL RELEASE;  Surgeon: Legrand Como  ?             Rudene Christians, MD;  Location: [facility];  Service: Orthopedics;   ?             Laterality: Right; ?2015: CATARACT EXTRACTION W/ INTRAOCULAR LENS IMPLANT; Bilateral ?No date: CHOLECYSTECTOMY ?05/31/2012: EUS; N/A ?    Comment:  Procedure: UPPER ENDOSCOPIC ULTRASOUND (EUS) LINEAR;   ?             Surgeon: [name], MD;  Location: [facility];   ?             Service: Endoscopy;  Laterality: N/A; ?No date: EXCISIONAL HEMORRHOIDECTOMY ?No date: JOINT REPLACEMENT; Bilateral ?07/07/2015: KNEE ARTHROSCOPY WITH LATERAL MENISECTOMY; Right ?    Comment:  Procedure: KNEE ARTHROSCOPY WITH LATERAL MENISECTOMY,  ?             PARTIAL SYNOVECTOMY;  Surgeon: [name], MD;   ?             Location: [facility];  Service: Orthopedics;  Laterality:  ?             Right; ?No date: LUMBAR LAMINECTOMY ?01/07/2019: PORTA CATH INSERTION; N/A ?    Comment:  Procedure: PORTA CATH INSERTION;  Surgeon: [name], ?             MD;  Location: [facility];  Service:  ?             Cardiovascular;  Laterality: N/A; ?1990: REDUCTION MAMMAPLASTY ?No date: RIGHT OOPHORECTOMY ?05/01/2014: TOTAL HIP ARTHROPLASTY; Left ?    Comment:  Dr. [name] ?08/05/2014: TOTAL HIP ARTHROPLASTY; Right ?    Comment:  Procedure: TOTAL HIP ARTHROPLASTY ANTERIOR APPROACH;   ?             Surgeon: [name], MD;   Location: [facility];  Service: ?             Orthopedics;  Laterality: Right; ?02/11/2016: ULNAR NERVE TRANSPOSITION; Right ?    Comment:  Procedure: ULNAR NERVE DECOMPRESSION/TRANSPOSITION;   ?  Surgeon: [name], MD;  Location: [facility];  Service: ?             Orthopedics;  Laterality: Right; ?12/19/2018: VIDEO BRONCHOSCOPY WITH ENDOBRONCHIAL ULTRASOUND; Left ?    Comment:  Procedure: VIDEO BRONCHOSCOPY WITH ENDOBRONCHIAL  ?             ULTRASOUND, LEFT, SLEEP APNEA;  Surgeon: [name], ?             MD;  Location: [facility];  Service: Thoracic;  Laterality: ?             Left; ? ?BMI   ? Body Mass Index: 30.23 kg/m?  ?  ? ? Reproductive/Obstetrics ?negative OB ROS ? ?  ? ? ? ? ? ? ? ? ? ? ? ? ? ?  ?  ? ? ? ? ? ? ? ?Anesthesia Physical ?Anesthesia Plan ? ?ASA: 3 ? ?Anesthesia Plan: General ETT  ? ?Post-op Pain Management:   ? ?Induction:  ? ?PONV Risk Score and Plan: 3 and Ondansetron, Dexamethasone, Treatment may vary due to age or medical condition and Aprepitant ? ?Airway Management Planned: Oral ETT ? ?Additional Equipment:  ? ?Intra-op Plan:  ? ?Post-operative Plan: Extubation in OR ? ?Informed Consent: I have reviewed the patients History and Physical, chart, labs and discussed the procedure including the risks, benefits and alternatives for the proposed anesthesia with the patient or authorized representative who has indicated his/her understanding and acceptance.  ? ? ? ?Dental advisory given ? ?Plan Discussed with: Anesthesiologist, CRNA and Surgeon ? ?Anesthesia Plan Comments:   ? ? ? ? ? ?Anesthesia Quick Evaluation ? ?

## 2021-05-14 NOTE — Anesthesia Postprocedure Evaluation (Signed)
Anesthesia Post Note ? ?Patient: Jennifer Hodges ? ?Procedure(s) Performed: VIDEO BRONCHOSCOPY WITH ENDOBRONCHIAL ULTRASOUND (Right) ? ?Patient location during evaluation: PACU ?Anesthesia Type: General ?Level of consciousness: awake and alert ?Pain management: pain level controlled ?Vital Signs Assessment: post-procedure vital signs reviewed and stable ?Respiratory status: spontaneous breathing, nonlabored ventilation and respiratory function stable ?Cardiovascular status: blood pressure returned to baseline and stable ?Postop Assessment: no apparent nausea or vomiting ?Anesthetic complications: no ? ? ?No notable events documented. ? ? ?Last Vitals:  ?Vitals:  ? 05/14/21 1430 05/14/21 1443  ?BP: 126/66 (!) 117/91  ?Pulse: 86 86  ?Resp: (!) 22 16  ?Temp: 36.4 ?C (!) 36.1 ?C  ?SpO2: 90% 93%  ?  ?Last Pain:  ?Vitals:  ? 05/14/21 1443  ?TempSrc: Temporal  ?PainSc: 1   ? ? ?  ?  ?  ?  ?  ?  ? ?[name] Ouch ? ? ? ? ?

## 2021-05-14 NOTE — Discharge Instructions (Addendum)
You have been given a device called an Acapella, use this device several times a day to help you clear secretions.  ? ?AMBULATORY SURGERY  ?DISCHARGE INSTRUCTIONS ? ? ?The drugs that you were given will stay in your system until tomorrow so for the next 24 hours you should not: ? ?Drive an automobile ?Make any legal decisions ?Drink any alcoholic beverage ? ? ?You may resume regular meals tomorrow.  Today it is better to start with liquids and gradually work up to solid foods. ? ?You may eat anything you prefer, but it is better to start with liquids, then soup and crackers, and gradually work up to solid foods. ? ? ?Please notify your doctor immediately if you have any unusual bleeding, trouble breathing, redness and pain at the surgery site, drainage, fever, or pain not relieved by medication. ? ? ? ?Additional Instructions: ? ? ?Please contact your physician with any problems or Same Day Surgery at [phone], Monday through Friday 6 am to 4 pm, or [place] at [facility] number at [phone].  ?

## 2021-05-14 NOTE — Interval H&P Note (Signed)
History and Physical Interval Note: ? ?05/14/2021 ?10:19 AM ? ?Jennifer Hodges  has presented today for surgery, with the diagnosis of LUNG NODULE RIGHT.  The various methods of treatment have been discussed with the patient and family. After consideration of risks, benefits and other options for treatment, the patient has consented to  Procedure(s): ?VIDEO BRONCHOSCOPY WITH ENDOBRONCHIAL ULTRASOUND (Right) as a surgical intervention.  The patient's history has been reviewed, patient examined, no change in status, stable for surgery.  I have reviewed the patient's chart and labs.  Questions were answered to the patient's satisfaction.   ? ? ?Jennifer Hodges ? ? ?

## 2021-05-14 NOTE — Transfer of Care (Signed)
Immediate Anesthesia Transfer of Care Note ? ?Patient: Jennifer Hodges ? ?Procedure(s) Performed: VIDEO BRONCHOSCOPY WITH ENDOBRONCHIAL ULTRASOUND (Right) ? ?Patient Location: PACU ? ?Anesthesia Type:General ? ?Level of Consciousness: awake, alert  and oriented ? ?Airway & Oxygen Therapy: Patient Spontanous Breathing and Patient connected to face mask oxygen ? ?Post-op Assessment: Report given to RN and Post -op Vital signs reviewed and stable ? ?Post vital signs: Reviewed and stable ? ?Last Vitals:  ?Vitals Value Taken Time  ?BP 132/64 05/14/21 1324  ?Temp    ?Pulse 88 05/14/21 1324  ?Resp 22 05/14/21 1324  ?SpO2 99 % 05/14/21 1324  ?Vitals shown include unvalidated device data. ? ?Last Pain:  ?Vitals:  ? 05/14/21 1049  ?TempSrc: Oral  ?PainSc: 8   ?   ? ?  ? ?Complications: No notable events documented. ?

## 2021-05-14 NOTE — Anesthesia Procedure Notes (Signed)
Procedure Name: Intubation ?Date/Time: 05/14/2021 12:23 PM ?Performed by: Lily Peer, Amarilys Lyles, CRNA ?Pre-anesthesia Checklist: Patient identified, Emergency Drugs available, Suction available and Patient being monitored ?Patient Re-evaluated:Patient Re-evaluated prior to induction ?Oxygen Delivery Method: Circle system utilized ?Preoxygenation: Pre-oxygenation with 100% oxygen ?Induction Type: IV induction ?Ventilation: Mask ventilation without difficulty ?Laryngoscope Size: McGraph and 3 ?Grade View: Grade I ?Tube type: Oral ?Tube size: 8.5 mm ?Number of attempts: 1 ?Airway Equipment and Method: Stylet ?Placement Confirmation: ETT inserted through vocal cords under direct vision, positive ETCO2 and breath sounds checked- equal and bilateral ?Secured at: 20 cm ?Tube secured with: Tape ?Dental Injury: Teeth and Oropharynx as per pre-operative assessment  ? ? ? ? ?

## 2021-05-15 ENCOUNTER — Encounter: Payer: Self-pay | Admitting: Pulmonary Disease

## 2021-05-17 LAB — CULTURE, BAL-QUANTITATIVE W GRAM STAIN
Culture: NO GROWTH
Special Requests: NORMAL

## 2021-05-18 LAB — CYTOLOGY - NON PAP

## 2021-05-21 ENCOUNTER — Encounter: Payer: Self-pay | Admitting: Oncology

## 2021-05-21 ENCOUNTER — Ambulatory Visit
Admission: RE | Admit: 2021-05-21 | Discharge: 2021-05-21 | Disposition: A | Discharge status: Home / Self Care | Payer: Medicare Other | Source: Ambulatory Visit | Attending: Radiation Oncology | Admitting: Radiation Oncology

## 2021-05-21 ENCOUNTER — Other Ambulatory Visit: Payer: Self-pay

## 2021-05-21 ENCOUNTER — Ambulatory Visit: Payer: Medicare Other | Admitting: Oncology

## 2021-05-21 ENCOUNTER — Inpatient Hospital Stay (HOSPITAL_BASED_OUTPATIENT_CLINIC_OR_DEPARTMENT_OTHER): Payer: Medicare Other | Admitting: Oncology

## 2021-05-21 VITALS — BP 110/75 | HR 87 | Temp 97.5°F | Resp 16 | Wt 159.4 lb

## 2021-05-21 DIAGNOSIS — Z7189 Other specified counseling: Secondary | ICD-10-CM

## 2021-05-21 DIAGNOSIS — Z510 Encounter for antineoplastic radiation therapy: Secondary | ICD-10-CM | POA: Insufficient documentation

## 2021-05-21 DIAGNOSIS — C3411 Malignant neoplasm of upper lobe, right bronchus or lung: Secondary | ICD-10-CM | POA: Diagnosis present

## 2021-05-21 DIAGNOSIS — Z5111 Encounter for antineoplastic chemotherapy: Secondary | ICD-10-CM | POA: Insufficient documentation

## 2021-05-21 DIAGNOSIS — C3412 Malignant neoplasm of upper lobe, left bronchus or lung: Secondary | ICD-10-CM

## 2021-05-21 DIAGNOSIS — D701 Agranulocytosis secondary to cancer chemotherapy: Secondary | ICD-10-CM | POA: Diagnosis not present

## 2021-05-21 DIAGNOSIS — C3490 Malignant neoplasm of unspecified part of unspecified bronchus or lung: Secondary | ICD-10-CM | POA: Diagnosis not present

## 2021-05-21 MED ORDER — DEXAMETHASONE 4 MG PO TABS: ORAL_TABLET | ORAL | 1 refills | Status: DC

## 2021-05-21 MED ORDER — LIDOCAINE-PRILOCAINE 2.5-2.5 % EX CREA: TOPICAL_CREAM | CUTANEOUS | 3 refills | Status: DC

## 2021-05-21 MED ORDER — PROCHLORPERAZINE MALEATE 10 MG PO TABS: 10.0000 mg | ORAL_TABLET | Freq: Four times a day (QID) | ORAL | 1 refills | Status: DC | PRN

## 2021-05-21 MED ORDER — FOLIC ACID 1 MG PO TABS: 1.0000 mg | ORAL_TABLET | Freq: Every day | ORAL | 3 refills | Status: DC

## 2021-05-21 MED ORDER — ONDANSETRON HCL 8 MG PO TABS: 8.0000 mg | ORAL_TABLET | Freq: Two times a day (BID) | ORAL | 1 refills | Status: DC | PRN

## 2021-05-21 NOTE — Progress Notes (Signed)
Pt states when she swallows it get stuck mid-way and feels like food will not digest drinks lots of liquid to try to get it down.  ?

## 2021-05-21 NOTE — Progress Notes (Signed)
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  EGFR and Ros as well as MET testing negative. PD-L1 was 50%   Patient completed concurrent chemoradiation with weekly carbotaxol chemotherapy on 03/12/2019.  Maintenance durvalumab started in January 2021 after scan showed partial response.  Patient completed 1 year of maintenance durvalumab in February 2022  Patient was found to have enlarging mediastinal adenopathyOn subsequent CT scan in January 2023.  This was followed by a PET scan which showed hypermetabolic mediastinal and right hilar lymph nodes but no other evidence of disease elsewhere.  This was biopsied and was consistent with non-small cell lung cancer specifically adenocarcinoma.  Cells were positive for TTF-1 and Napsin A and negative for p40.  Interval history-patient reports some exertional shortness of breath and ongoing wheezing.  ECOG PS- 1 Pain scale- 0   Review of systems- Review of Systems  Constitutional:  Positive for malaise/fatigue. Negative for chills, fever and weight loss.  HENT:  Negative for congestion, ear discharge and nosebleeds.    Eyes:  Negative for blurred vision.  Respiratory:  Positive for shortness of breath. Negative for cough, hemoptysis, sputum production and wheezing.   Cardiovascular:  Negative for chest pain, palpitations, orthopnea and claudication.  Gastrointestinal:  Negative for abdominal pain, blood in stool, constipation, diarrhea, heartburn, melena, nausea and vomiting.  Genitourinary:  Negative for dysuria, flank pain, frequency, hematuria and urgency.  Musculoskeletal:  Negative for back pain, joint pain and myalgias.  Skin:  Negative for rash.  Neurological:  Negative for dizziness, tingling, focal weakness, seizures, weakness and headaches.  Endo/Heme/Allergies:  Does not bruise/bleed easily.  Psychiatric/Behavioral:  Negative for depression and suicidal ideas. The patient does not have insomnia.       Allergies  Allergen Reactions   Aspirin Hives, Shortness Of Breath and Other (See Comments)    Difficulty breathing   Celebrex [Celecoxib] Shortness Of Breath   Morphine And Related Nausea And Vomiting and Swelling   Adhesive [Tape] Other (See Comments)    Took top layer of skin off when removed.   Clarithromycin Nausea And Vomiting   Codeine Nausea And Vomiting   Darvon [Propoxyphene Hcl] Nausea And Vomiting   Demerol [Meperidine] Nausea And Vomiting   Flonase [Fluticasone Propionate] Other (See Comments)    Fungal infection in nose   Simvastatin Other (See Comments)    "caused ulcers in mouth, and fever"   Talwin [Pentazocine] Nausea And Vomiting     Past Medical History:  Diagnosis Date   Anemia    Asthma    No Inhalers--Dr. [name] will order as needed   Bronchiectasis ([place])    mild   Chronic headaches     followed by [facility] migraines   COPD (chronic obstructive pulmonary disease) ([place])  DDD (degenerative disc disease), lumbar    Diverticulosis    Family history of adverse reaction to anesthesia    mother and sisters-PONV   Gall stones    history of   GERD  (gastroesophageal reflux disease)    EGD 8/09- non bleeding erosive gastritis, documentd esophageal ulcerations.    Hiatal hernia    small   History of kidney stones    History of pneumonia    Hypercholesterolemia    IBS (irritable bowel syndrome)    Malignant neoplasm of upper lobe of left lung ([place]) 12/27/2018   Meniere disease    Murmur    Osteoarthritis    lumbar disc disease, left hip   Personal history of chemotherapy    Personal history of radiation therapy    Pneumonia 12/2020   PONV (postoperative nausea and vomiting)    Sleep apnea    uses cpap   Vertigo    Weakness of right side of body      Past Surgical History:  Procedure Laterality Date   ABDOMINAL HYSTERECTOMY  age 84   ANTERIOR CERVICAL DECOMP/DISCECTOMY FUSION N/A 02/24/2015   Procedure: CERVICAL FOUR-FIVE, CERVICAL FIVE-SIX, CERVICAL SIX-SEVEN ANTERIOR CERVICAL DECOMPRESSION/DISCECTOMY FUSION ;  Surgeon: [name], MD;  Location: [facility];  Service: Neurosurgery;  Laterality: N/A;  C45 C56 C67 anterior cervical decompression with fusion interbody prosthesis plating and bonegraft   APPENDECTOMY     BACK SURGERY  1988   4th lumbar fusion   BLADDER SURGERY N/A    with vaginal wall repair   BREAST CYST ASPIRATION Bilateral    neg   BREAST SURGERY Bilateral    cyst removed and reduction   CARDIAC CATHETERIZATION  2014   CARPAL TUNNEL RELEASE Right 02/11/2016   Procedure: CARPAL TUNNEL RELEASE;  Surgeon: [name], MD;  Location: [facility];  Service: Orthopedics;  Laterality: Right;   CATARACT EXTRACTION W/ INTRAOCULAR LENS IMPLANT Bilateral 2015   CHOLECYSTECTOMY     EUS N/A 05/31/2012   Procedure: UPPER ENDOSCOPIC ULTRASOUND (EUS) LINEAR;  Surgeon: [name], MD;  Location: [facility];  Service: Endoscopy;  Laterality: N/A;   EXCISIONAL HEMORRHOIDECTOMY     JOINT REPLACEMENT Bilateral    KNEE ARTHROSCOPY WITH LATERAL MENISECTOMY Right 07/07/2015   Procedure: KNEE ARTHROSCOPY WITH  LATERAL MENISECTOMY, PARTIAL SYNOVECTOMY;  Surgeon: [name], MD;  Location: [facility];  Service: Orthopedics;  Laterality: Right;   LUMBAR LAMINECTOMY     PORTA CATH INSERTION N/A 01/07/2019   Procedure: PORTA CATH INSERTION;  Surgeon: [name], MD;  Location: [facility];  Service: Cardiovascular;  Laterality: N/A;   REDUCTION MAMMAPLASTY  1990   RIGHT OOPHORECTOMY     TOTAL HIP ARTHROPLASTY Left 05/01/2014   Dr. [name]   TOTAL HIP ARTHROPLASTY Right 08/05/2014   Procedure: TOTAL HIP ARTHROPLASTY ANTERIOR APPROACH;  Surgeon: [name], MD;  Location: [facility];  Service: Orthopedics;  Laterality: Right;   ULNAR NERVE TRANSPOSITION Right 02/11/2016   Procedure: ULNAR NERVE DECOMPRESSION/TRANSPOSITION;  Surgeon: [name], MD;  Location: [facility];  Service: Orthopedics;  Laterality: Right;   VIDEO BRONCHOSCOPY WITH ENDOBRONCHIAL ULTRASOUND Left 12/19/2018   Procedure: VIDEO BRONCHOSCOPY WITH ENDOBRONCHIAL ULTRASOUND, LEFT, SLEEP APNEA;  Surgeon: [name], MD;  Location: [facility];  Service: Thoracic;  Laterality: Left;   VIDEO BRONCHOSCOPY WITH ENDOBRONCHIAL ULTRASOUND Right 05/14/2021   Procedure: VIDEO BRONCHOSCOPY WITH ENDOBRONCHIAL ULTRASOUND;  Surgeon: Tyler Pita, MD;  Location: [facility];  Service: Pulmonary;  Laterality: Right;  Social History   Socioeconomic History   Marital status: Married    Spouse name: Not on file   Number of children: Not on file   Years of education: Not on file   Highest education level: Not on file  Occupational History   Not on file  Tobacco Use   Smoking status: Former    Packs/day: 1.00    Years: 40.00    Pack years: 40.00    Types: Cigarettes    Quit date: 06/13/2007    Years since quitting: 13.9   Smokeless tobacco: Never  Vaping Use   Vaping Use: Never used  Substance and Sexual Activity   Alcohol use: No    Alcohol/week: 0.0 standard drinks   Drug use: No   Sexual activity: Never  Other Topics  Concern   Not on file  Social History Narrative   Not on file   Social Determinants of Health   Financial Resource Strain: Not on file  Food Insecurity: Not on file  Transportation Needs: Not on file  Physical Activity: Not on file  Stress: Not on file  Social Connections: Not on file  Intimate Partner Violence: Not on file    Family History  Problem Relation Age of Onset   Heart disease Mother        s/p stent   Hypertension Mother    Hypercholesterolemia Mother    Diabetes Father    Stomach cancer Other        uncle   Breast cancer Cousin    Breast cancer Paternal Aunt      Current Outpatient Medications:    albuterol (VENTOLIN HFA) 108 (90 Base) MCG/ACT inhaler, Inhale 2 puffs into the lungs every 6 (six) hours as needed for wheezing or shortness of breath., Disp: , Rfl:    Bioflavonoid Products (ESTER C PO), Take 1 tablet by mouth 3 (three) times daily. 500 mg, Disp: , Rfl:    Calcium-Magnesium-Zinc (CAL-MAG-ZINC PO), Take 1 tablet by mouth daily., Disp: , Rfl:    carboxymethylcellulose (REFRESH PLUS) 0.5 % SOLN, Place 1 drop into both eyes 3 (three) times daily as needed (dry eyes)., Disp: , Rfl:    Cholecalciferol (EQL VITAMIN D3) 25 MCG (1000 UT) capsule, Take 1,000 Units by mouth daily., Disp: , Rfl:    Coenzyme Q10 (CO Q 10) 100 MG CAPS, Take 100 mg by mouth daily. , Disp: , Rfl:    cyclobenzaprine (FLEXERIL) 10 MG tablet, Take 10 mg by mouth at bedtime., Disp: , Rfl:    DHEA 25 MG CAPS, Take 25 mg by mouth daily., Disp: , Rfl:    ezetimibe (ZETIA) 10 MG tablet, Take 5 mg by mouth in the morning and at bedtime. , Disp: , Rfl:    folic acid (FOLVITE) 242 MCG tablet, Take 400 mcg by mouth daily., Disp: , Rfl:    Garlic 3536 MG CAPS, Take 1,000 mg by mouth daily., Disp: , Rfl:    Histamine Dihydrochloride (AUSTRALIAN DREAM ARTHRITIS) 0.025 % CREA, Apply 1 application topically 4 (four) times daily as needed (pain)., Disp: , Rfl:    HYDROcodone-acetaminophen  (NORCO/VICODIN) 5-325 MG tablet, 1-2 tabs po bid prn (Patient taking differently: Take 1 tablet by mouth at bedtime.), Disp: 10 tablet, Rfl: 0   levOCARNitine (L-CARNITINE) 250 MG TABS, Take 1 tablet by mouth daily. With Chromium 250 mg, Disp: , Rfl:    meclizine (ANTIVERT) 25 MG tablet, Take 1 tablet (25 mg total) by mouth every 6 (six) hours as needed  for dizziness., Disp: 30 tablet, Rfl: 0   pantoprazole (PROTONIX) 40 MG tablet, Take 40 mg by mouth 2 (two) times daily., Disp: , Rfl:    polyethylene glycol powder (GLYCOLAX/MIRALAX) 17 GM/SCOOP powder, Take 17 g by mouth daily as needed for moderate constipation., Disp: , Rfl:    pyridOXINE (VITAMIN B-6) 100 MG tablet, Take 100 mg by mouth daily., Disp: , Rfl:    Selenium 200 MCG CAPS, Take 200 mcg by mouth daily., Disp: , Rfl:    Sennosides (SENNA) 8.6 MG CAPS, Take 1 capsule by mouth daily as needed (constipation)., Disp: , Rfl:    sucralfate (CARAFATE) 1 GM/10ML suspension, Take 1 g by mouth daily., Disp: , Rfl:    Tiotropium Bromide-Olodaterol (STIOLTO RESPIMAT) 2.5-2.5 MCG/ACT AERS, Inhale 2 puffs into the lungs daily. (Patient taking differently: Inhale 2 puffs into the lungs every morning.), Disp: 4 g, Rfl: 0   triamcinolone (NASACORT) 55 MCG/ACT AERO nasal inhaler, Place 2 sprays into the nose daily., Disp: , Rfl:    UBRELVY 100 MG TABS, Take 100 mg by mouth daily as needed (migraines)., Disp: , Rfl:    Vitamin A 2400 MCG (8000 UT) CAPS, Take 8,000 Units by mouth daily., Disp: , Rfl:    Vitamin E 200 units TABS, Take 200 Units by mouth daily., Disp: , Rfl:    cetirizine (ZYRTEC) 5 MG tablet, Take 1 tablet (5 mg total) by mouth daily. (Patient not taking: Reported on 05/21/2021), Disp: 30 tablet, Rfl: 0   Cyanocobalamin (VITAMIN B-12) 2500 MCG SUBL, Place 2,500 mcg under the tongue daily. (Patient not taking: Reported on 05/21/2021), Disp: , Rfl:    dexamethasone (DECADRON) 4 MG tablet, Take 1 tab every 12 hours the day before pemetrexed chemo,  then take 2 tabs once a day for 3 days starting the day after carboplatin., Disp: 30 tablet, Rfl: 1   folic acid (FOLVITE) 1 MG tablet, Take 1 tablet (1 mg total) by mouth daily. Start 7 days before pemetrexed chemotherapy. Continue until 21 days after pemetrexed completed., Disp: 100 tablet, Rfl: 3   lidocaine-prilocaine (EMLA) cream, Apply to affected area once, Disp: 30 g, Rfl: 3   ondansetron (ZOFRAN) 8 MG tablet, Take 1 tablet (8 mg total) by mouth 2 (two) times daily as needed (Nausea or vomiting). Start if needed on the third day after carboplatin., Disp: 30 tablet, Rfl: 1   prochlorperazine (COMPAZINE) 10 MG tablet, Take 1 tablet (10 mg total) by mouth every 6 (six) hours as needed (Nausea or vomiting)., Disp: 30 tablet, Rfl: 1  Physical exam:  Vitals:   05/21/21 0831  BP: 110/75  Pulse: 87  Resp: 16  Temp: (!) 97.5 F (36.4 C)  SpO2: 98%  Weight: 159 lb 6.4 oz (72.3 kg)   Physical Exam Constitutional:      General: She is not in acute distress. Cardiovascular:     Rate and Rhythm: Normal rate and regular rhythm.     Heart sounds: Normal heart sounds.  Pulmonary:     Effort: Pulmonary effort is normal.     Breath sounds: Normal breath sounds.  Abdominal:     General: Bowel sounds are normal.     Palpations: Abdomen is soft.  Skin:    General: Skin is warm and dry.  Neurological:     Mental Status: She is alert and oriented to person, place, and time.     CMP Latest Ref Rng & Units 03/29/2021  Glucose 70 - 99 mg/dL 145(H)  BUN  8 - 23 mg/dL 18  Creatinine 0.44 - 1.00 mg/dL 0.48  Sodium 135 - 145 mmol/L 134(L)  Potassium 3.5 - 5.1 mmol/L 3.8  Chloride 98 - 111 mmol/L 97(L)  CO2 22 - 32 mmol/L 27  Calcium 8.9 - 10.3 mg/dL 9.5  Total Protein 6.5 - 8.1 g/dL 6.7  Total Bilirubin 0.3 - 1.2 mg/dL 0.4  Alkaline Phos 38 - 126 U/L 77  AST 15 - 41 U/L 22  ALT 0 - 44 U/L 17   CBC Latest Ref Rng & Units 03/29/2021  WBC 4.0 - 10.5 K/uL 8.0  Hemoglobin 12.0 - 15.0 g/dL 13.8   Hematocrit 36.0 - 46.0 % 40.8  Platelets 150 - 400 K/uL 254      No results found.   Assessment and plan- Patient is a 76 y.o. female with history of stage III adenocarcinoma of the lung in September 2020 s/p concurrent chemoradiation with weekly CarboTaxol followed by 1 year of maintenance durvalumab now with nodal recurrence  I have reviewed PET/CT scan images independently and discussed findings with the patient including results of bronchoscopy which confirmed mediastinal and hilar recurrence after 2 years.  No other evidence of disease elsewhere.  I did confirm with Dr. Baruch Gouty that he would be able to really radiate her with 60gy over 6 weeks.  I will plan to offer her 4 cycles of carboplatin and Alimta chemotherapy every 3 weeks followed by maintenance Alimta if she tolerates it.  Discussed risks and benefits of chemotherapy including all but not limited to nausea, vomiting, low blood counts, risk of infections and hospitalization.  Treatment will be given with a palliative intent given that this is a recurrent setting.  Patient already has a port in place and will proceed for cycle 1 of carbo Alimta next week along with B12 injection.  She will be seen by symptom management for possible IV fluids in about 2-1/2 weeks time and I will see her back for cycle 2 of carbo Alimta.  Patient verbalized understanding of the plan   Visit Diagnosis 1. Recurrent non-small cell lung cancer ([place])   2. Goals of care, counseling/discussion   3. Malignant neoplasm of upper lobe of left lung ([place])      Dr. Randa Evens, MD, MPH [facility] [phone] 05/21/2021 12:25 PM

## 2021-05-21 NOTE — Progress Notes (Signed)
Radiation Oncology ?Follow up Note old patient new area right lung cancer ? ?Name: Jennifer Hodges   ?Date:   05/21/2021 ?MRN:  [id number] ?DOB: Feb 09, 1946  ? ? ?This 76 y.o. female presents to the clinic today for follow-up on bronchoscopy for new PET positive lesions in the right hilar and mediastinal region in patient previously treated out 2 years for stage IIIa (T1b N2 M0) non-small cell lung cancer of the left lung favoring adenocarcinoma. ? ?REFERRING PROVIDER: [name], MD ? ?HPI: Patient is a 76 year old female now out over 2 years having completed concurrent chemoradiation therapy for stage IIIa non-small cell lung cancer of the left lung..  CT scan back in January showed stable left hilar perihilar soft tissue density.  There was progressive mediastinal hilar adenopathy mostly on the right side.  PET CT scan confirmedHypermetabolic mediastinal and right hilar lymph nodes concerning for metastatic disease.  No other hypermetabolic activity in the left perihilar posttreatment scarring was noted.  She underwent bronchoscopy and EBUS guided fine-needle aspiration showing metastatic non-small cell lung cancer.  Special stains including TTF-1 and Napsin A were compatible with adenocarcinoma.  She continues to have a slightly productive clear cough.  She otherwise appetite is good she is having no bone pain.  She has been seen by medical oncology with recommendation to treat this as a stage III disease with concurrent chemoradiation.  Seen today for radiation oncology evaluation. ? ?COMPLICATIONS OF TREATMENT: none ? ?FOLLOW UP COMPLIANCE: keeps appointments  ? ?PHYSICAL EXAM:  ?There were no vitals taken for this visit. ?Well-developed well-nourished patient in NAD. HEENT reveals PERLA, EOMI, discs not visualized.  Oral cavity is clear. No oral mucosal lesions are identified. Neck is clear without evidence of cervical or supraclavicular adenopathy. Lungs are clear to A&P. Cardiac examination is essentially  unremarkable with regular rate and rhythm without murmur rub or thrill. Abdomen is benign with no organomegaly or masses noted. Motor sensory and DTR levels are equal and symmetric in the upper and lower extremities. Cranial nerves II through XII are grossly intact. Proprioception is intact. No peripheral adenopathy or edema is identified. No motor or sensory levels are noted. Crude visual fields are within normal range. ? ?RADIOLOGY RESULTS: CT scans and PET CT scans reviewed ?Pathology reports reviewed ? ?PLAN: At this time elect to go ahead with IMRT radiation therapy.  I would choose IMRT based on the central location of the areas of hypermetabolic activity including right hilum and mediastinum as well as previously treated left chest.  Risks and benefits of treatment including possible exacerbation of cough fatigue dysphagia secondary radiation esophagitis alteration of blood counts all were reviewed in detail with the patient.  I would like to treat to 66 Gray over 33 fractions.  We will use PET fusion study for treatment planning purposes.  Patient comprehends my recommendations well.  I have personally set her up for CT simulation next week. ? ?I would like to take this opportunity to thank you for allowing me to participate in the care of your patient.. ?  ? Noreene Filbert, MD ? ?

## 2021-05-21 NOTE — Progress Notes (Signed)
DISCONTINUE ON PATHWAY REGIMEN - Non-Small Cell Lung ? ? ?  A cycle is every 14 days: ?    Durvalumab  ? ?**Always confirm dose/schedule in your pharmacy ordering system** ? ?REASON: Disease Progression ?PRIOR TREATMENT: [id number]: Durvalumab 10 mg/kg q14 Days x up to 12 Months ?TREATMENT RESPONSE: Progressive Disease (PD) ? ?START OFF PATHWAY REGIMEN - Non-Small Cell Lung ? ? ?[id number]:Carboplatin AUC=5 + Pemetrexed 500 mg/m2 q21 Days: ?  A cycle is every 21 days: ?    Pemetrexed  ?    Carboplatin  ? ?**Always confirm dose/schedule in your pharmacy ordering system** ? ?Patient Characteristics: ?Local Recurrence ?Therapeutic Status: Local Recurrence ?Intent of Therapy: ?Non-Curative / Palliative Intent, Discussed with Patient ?

## 2021-05-24 ENCOUNTER — Inpatient Hospital Stay: Payer: Medicare Other

## 2021-05-26 ENCOUNTER — Inpatient Hospital Stay: Payer: Medicare Other

## 2021-05-26 ENCOUNTER — Other Ambulatory Visit: Payer: Self-pay

## 2021-05-26 ENCOUNTER — Other Ambulatory Visit: Payer: Self-pay | Admitting: Oncology

## 2021-05-26 ENCOUNTER — Ambulatory Visit
Admission: RE | Admit: 2021-05-26 | Discharge: 2021-05-26 | Disposition: A | Discharge status: Home / Self Care | Payer: Medicare Other | Source: Ambulatory Visit | Attending: Radiation Oncology | Admitting: Radiation Oncology

## 2021-05-26 VITALS — BP 127/69 | HR 86 | Temp 96.5°F | Resp 24 | Ht 61.0 in | Wt 162.3 lb

## 2021-05-26 DIAGNOSIS — Z510 Encounter for antineoplastic radiation therapy: Secondary | ICD-10-CM | POA: Insufficient documentation

## 2021-05-26 DIAGNOSIS — C3412 Malignant neoplasm of upper lobe, left bronchus or lung: Secondary | ICD-10-CM

## 2021-05-26 DIAGNOSIS — C3411 Malignant neoplasm of upper lobe, right bronchus or lung: Secondary | ICD-10-CM | POA: Insufficient documentation

## 2021-05-26 DIAGNOSIS — C3490 Malignant neoplasm of unspecified part of unspecified bronchus or lung: Secondary | ICD-10-CM

## 2021-05-26 DIAGNOSIS — D649 Anemia, unspecified: Secondary | ICD-10-CM

## 2021-05-26 LAB — CBC WITH DIFFERENTIAL/PLATELET
Abs Immature Granulocytes: 0.04 10*3/uL (ref 0.00–0.07)
Basophils Absolute: 0 10*3/uL (ref 0.0–0.1)
Basophils Relative: 0 %
Eosinophils Absolute: 0.1 10*3/uL (ref 0.0–0.5)
Eosinophils Relative: 1 %
HCT: 38.4 % (ref 36.0–46.0)
Hemoglobin: 13 g/dL (ref 12.0–15.0)
Immature Granulocytes: 0 %
Lymphocytes Relative: 10 %
Lymphs Abs: 1.1 10*3/uL (ref 0.7–4.0)
MCH: 30.9 pg (ref 26.0–34.0)
MCHC: 33.9 g/dL (ref 30.0–36.0)
MCV: 91.2 fL (ref 80.0–100.0)
Monocytes Absolute: 0.6 10*3/uL (ref 0.1–1.0)
Monocytes Relative: 6 %
Neutro Abs: 8.4 10*3/uL — ABNORMAL HIGH (ref 1.7–7.7)
Neutrophils Relative %: 83 %
Platelets: 279 10*3/uL (ref 150–400)
RBC: 4.21 MIL/uL (ref 3.87–5.11)
RDW: 12.2 % (ref 11.5–15.5)
WBC: 10.1 10*3/uL (ref 4.0–10.5)
nRBC: 0 % (ref 0.0–0.2)

## 2021-05-26 LAB — COMPREHENSIVE METABOLIC PANEL
ALT: 17 U/L (ref 0–44)
AST: 24 U/L (ref 15–41)
Albumin: 4.4 g/dL (ref 3.5–5.0)
Alkaline Phosphatase: 68 U/L (ref 38–126)
Anion gap: 8 (ref 5–15)
BUN: 13 mg/dL (ref 8–23)
CO2: 27 mmol/L (ref 22–32)
Calcium: 9.5 mg/dL (ref 8.9–10.3)
Chloride: 101 mmol/L (ref 98–111)
Creatinine, Ser: 0.52 mg/dL (ref 0.44–1.00)
GFR, Estimated: >60 mL/min (ref >60)
Glucose, Bld: 110 mg/dL — ABNORMAL HIGH (ref 70–99)
Potassium: 4 mmol/L (ref 3.5–5.1)
Sodium: 136 mmol/L (ref 135–145)
Total Bilirubin: 0.3 mg/dL (ref 0.3–1.2)
Total Protein: 6.7 g/dL (ref 6.5–8.1)

## 2021-05-26 MED ORDER — SODIUM CHLORIDE 0.9 % IV SOLN
150.0000 mg | Freq: Once | INTRAVENOUS | Status: AC
  Administered 2021-05-26: 150 mg via INTRAVENOUS
  Filled 2021-05-26: qty 150

## 2021-05-26 MED ORDER — PALONOSETRON HCL INJECTION 0.25 MG/5ML
0.2500 mg | Freq: Once | INTRAVENOUS | Status: AC
  Administered 2021-05-26: 0.25 mg via INTRAVENOUS
  Filled 2021-05-26: qty 5

## 2021-05-26 MED ORDER — CYANOCOBALAMIN 1000 MCG/ML IJ SOLN
1000.0000 ug | Freq: Once | INTRAMUSCULAR | Status: AC
  Administered 2021-05-26: 1000 ug via INTRAMUSCULAR
  Filled 2021-05-26: qty 1

## 2021-05-26 MED ORDER — SODIUM CHLORIDE 0.9 % IV SOLN
394.0000 mg | Freq: Once | INTRAVENOUS | Status: AC
  Administered 2021-05-26: 390 mg via INTRAVENOUS
  Filled 2021-05-26: qty 39

## 2021-05-26 MED ORDER — HEPARIN SOD (PORK) LOCK FLUSH 100 UNIT/ML IV SOLN
500.0000 [IU] | Freq: Once | INTRAVENOUS | Status: AC | PRN
  Administered 2021-05-26: 500 [IU]
  Filled 2021-05-26: qty 5

## 2021-05-26 MED ORDER — SODIUM CHLORIDE 0.9 % IV SOLN: 662.5000 mg | Freq: Once | INTRAVENOUS | Status: DC

## 2021-05-26 MED ORDER — SODIUM CHLORIDE 0.9 % IV SOLN
Freq: Once | INTRAVENOUS | Status: AC
  Filled 2021-05-26: qty 250

## 2021-05-26 MED ORDER — SODIUM CHLORIDE 0.9 % IV SOLN
10.0000 mg | Freq: Once | INTRAVENOUS | Status: AC
  Administered 2021-05-26: 10 mg via INTRAVENOUS
  Filled 2021-05-26: qty 10

## 2021-05-26 MED ORDER — SODIUM CHLORIDE 0.9 % IV SOLN
500.0000 mg/m2 | Freq: Once | INTRAVENOUS | Status: AC
  Administered 2021-05-26: 900 mg via INTRAVENOUS
  Filled 2021-05-26: qty 36

## 2021-05-26 NOTE — Patient Instructions (Signed)
[facility] CAN[facility] AT [place]  Discharge Instructions: ?Thank you for choosing [facility] to provide your oncology and hematology care.  ?If you have a lab appointment with the [facility], please go directly to the [facility] and check in at the registration area. ? ?Wear comfortable clothing and clothing appropriate for easy access to any Portacath or PICC line.  ? ?We strive to give you quality time with your provider. You may need to reschedule your appointment if you arrive late (15 or more minutes).  Arriving late affects you and other patients whose appointments are after yours.  Also, if you miss three or more appointments without notifying the office, you may be dismissed from the clinic at the provider?s discretion.    ?  ?For prescription refill requests, have your pharmacy contact our office and allow 72 hours for refills to be completed.   ? ?Today you received the following chemotherapy and/or immunotherapy agents ALIMTA and CARBOPLATIN    ?  ?To help prevent nausea and vomiting after your treatment, we encourage you to take your nausea medication as directed. ? ?BELOW ARE SYMPTOMS THAT SHOULD BE REPORTED IMMEDIATELY: ?*FEVER GREATER THAN 100.4 F (38 ?C) OR HIGHER ?*CHILLS OR SWEATING ?*NAUSEA AND VOMITING THAT IS NOT CONTROLLED WITH YOUR NAUSEA MEDICATION ?*UNUSUAL SHORTNESS OF BREATH ?*UNUSUAL BRUISING OR BLEEDING ?*URINARY PROBLEMS (pain or burning when urinating, or frequent urination) ?*BOWEL PROBLEMS (unusual diarrhea, constipation, pain near the anus) ?TENDERNESS IN MOUTH AND THROAT WITH OR WITHOUT PRESENCE OF ULCERS (sore throat, sores in mouth, or a toothache) ?UNUSUAL RASH, SWELLING OR PAIN  ?UNUSUAL VAGINAL DISCHARGE OR ITCHING  ? ?Items with * indicate a potential emergency and should be followed up as soon as possible or go to the Emergency Department if any problems should occur. ? ?Please show the CHEMOTHERAPY ALERT CARD or IMMUNOTHERAPY ALERT CARD at  check-in to the Emergency Department and triage nurse. ? ?Should you have questions after your visit or need to cancel or reschedule your appointment, please contact [facility]: [address] CANCER [place] AT [place]  [phone] and follow the prompts.  Office hours are 8:00 a.m. to 4:30 p.m. Monday - Friday. Please note that voicemails left after 4:00 p.m. may not be returned until the following business day.  We are closed weekends and major holidays. You have access to a nurse at all times for urgent questions. Please call the main number to the clinic [phone] and follow the prompts. ? ?For any non-urgent questions, you may also contact your provider using MyChart. We now offer e-Visits for anyone 60 and older to request care online for non-urgent symptoms. For details visit [email or web address]. ?  ?Also download the MyChart app! Go to the app store, search "MyChart", open the app, select , and log in with your MyChart username and password. ? ?Due to Covid, a mask is required upon entering the hospital/clinic. If you do not have a mask, one will be given to you upon arrival. For doctor visits, patients may have 1 support person aged 74 or older with them. For treatment visits, patients cannot have anyone with them due to current Covid guidelines and our immunocompromised population.  ? ?Pemetrexed injection ?What is this medication? ?PEMETREXED (PEM e TREX ed) is a chemotherapy drug used to treat lung cancers like non-small cell lung cancer and mesothelioma. It may also be used to treat other cancers. ?This medicine may be used for other purposes; ask your health care provider or pharmacist if you  have questions. ?COMMON BRAND NAME(S): Alimta, PEMFEXY ?What should I tell my care team before I take this medication? ?They need to know if you have any of these conditions: ?infection (especially a virus infection such as chickenpox, cold sores, or herpes) ?kidney disease ?low blood counts, like low  white cell, platelet, or red cell counts ?lung or breathing disease, like asthma ?radiation therapy ?an unusual or allergic reaction to pemetrexed, other medicines, foods, dyes, or preservative ?pregnant or trying to get pregnant ?breast-feeding ?How should I use this medication? ?This drug is given as an infusion into a vein. It is administered in a hospital or clinic by a specially trained health care professional. ?Talk to your pediatrician regarding the use of this medicine in children. Special care may be needed. ?Overdosage: If you think you have taken too much of this medicine contact a poison control center or emergency room at once. ?NOTE: This medicine is only for you. Do not share this medicine with others. ?What if I miss a dose? ?It is important not to miss your dose. Call your doctor or health care professional if you are unable to keep an appointment. ?What may interact with this medication? ?This medicine may interact with the following medications: ?Ibuprofen ?This list may not describe all possible interactions. Give your health care provider a list of all the medicines, herbs, non-prescription drugs, or dietary supplements you use. Also tell them if you smoke, drink alcohol, or use illegal drugs. Some items may interact with your medicine. ?What should I watch for while using this medication? ?Visit your doctor for checks on your progress. This drug may make you feel generally unwell. This is not uncommon, as chemotherapy can affect healthy cells as well as cancer cells. Report any side effects. Continue your course of treatment even though you feel ill unless your doctor tells you to stop. ?In some cases, you may be given additional medicines to help with side effects. Follow all directions for their use. ?Call your doctor or health care professional for advice if you get a fever, chills or sore throat, or other symptoms of a cold or flu. Do not treat yourself. This drug decreases your body's  ability to fight infections. Try to avoid being around people who are sick. ?This medicine may increase your risk to bruise or bleed. Call your doctor or health care professional if you notice any unusual bleeding. ?Be careful brushing and flossing your teeth or using a toothpick because you may get an infection or bleed more easily. If you have any dental work done, tell your dentist you are receiving this medicine. ?Avoid taking products that contain aspirin, acetaminophen, ibuprofen, naproxen, or ketoprofen unless instructed by your doctor. These medicines may hide a fever. ?Call your doctor or health care professional if you get diarrhea or mouth sores. Do not treat yourself. ?To protect your kidneys, drink water or other fluids as directed while you are taking this medicine. ?Do not become pregnant while taking this medicine or for 6 months after stopping it. Women should inform their doctor if they wish to become pregnant or think they might be pregnant. Men should not father a child while taking this medicine and for 3 months after stopping it. This may interfere with the ability to father a child. You should talk to your doctor or health care professional if you are concerned about your fertility. There is a potential for serious side effects to an unborn child. Talk to your health care professional or  pharmacist for more information. Do not breast-feed an infant while taking this medicine or for 1 week after stopping it. ?What side effects may I notice from receiving this medication? ?Side effects that you should report to your doctor or health care professional as soon as possible: ?allergic reactions like skin rash, itching or hives, swelling of the face, lips, or tongue ?breathing problems ?redness, blistering, peeling or loosening of the skin, including inside the mouth ?signs and symptoms of bleeding such as bloody or black, tarry stools; red or dark-brown urine; spitting up blood or brown material  that looks like coffee grounds; red spots on the skin; unusual bruising or bleeding from the eye, gums, or nose ?signs and symptoms of infection like fever or chills; cough; sore throat; pain or trouble pass

## 2021-05-27 ENCOUNTER — Telehealth: Payer: Self-pay | Admitting: Pulmonary Disease

## 2021-05-27 MED ORDER — STIOLTO RESPIMAT 2.5-2.5 MCG/ACT IN AERS: 2.0000 | INHALATION_SPRAY | Freq: Every day | RESPIRATORY_TRACT | 11 refills | Status: DC

## 2021-05-27 NOTE — Telephone Encounter (Signed)
Spoke to patient. She is requesting Rx for Stiolto, as she feels that medication is effective.  ?Rx sent to preferred pharmacy.  ?Nothing further needed.  ? ?

## 2021-05-30 ENCOUNTER — Other Ambulatory Visit: Payer: Self-pay | Admitting: Oncology

## 2021-05-30 DIAGNOSIS — C3412 Malignant neoplasm of upper lobe, left bronchus or lung: Secondary | ICD-10-CM

## 2021-05-31 ENCOUNTER — Encounter: Payer: Self-pay | Admitting: Oncology

## 2021-06-02 ENCOUNTER — Other Ambulatory Visit: Payer: Self-pay

## 2021-06-02 ENCOUNTER — Ambulatory Visit
Admission: RE | Admit: 2021-06-02 | Discharge: 2021-06-02 | Disposition: A | Discharge status: Home / Self Care | Payer: Medicare Other | Source: Ambulatory Visit | Attending: Oncology | Admitting: Oncology

## 2021-06-02 DIAGNOSIS — Z510 Encounter for antineoplastic radiation therapy: Secondary | ICD-10-CM | POA: Diagnosis not present

## 2021-06-02 DIAGNOSIS — C3490 Malignant neoplasm of unspecified part of unspecified bronchus or lung: Secondary | ICD-10-CM | POA: Diagnosis present

## 2021-06-02 MED ORDER — GADOBUTROL 1 MMOL/ML IV SOLN
7.0000 mL | Freq: Once | INTRAVENOUS | Status: AC | PRN
  Administered 2021-06-02: 7 mL via INTRAVENOUS

## 2021-06-03 ENCOUNTER — Ambulatory Visit: Admission: RE | Admit: 2021-06-03 | Payer: Medicare Other | Source: Ambulatory Visit

## 2021-06-04 ENCOUNTER — Inpatient Hospital Stay: Payer: Medicare Other

## 2021-06-04 ENCOUNTER — Other Ambulatory Visit: Payer: Self-pay

## 2021-06-04 ENCOUNTER — Inpatient Hospital Stay (HOSPITAL_BASED_OUTPATIENT_CLINIC_OR_DEPARTMENT_OTHER): Payer: Medicare Other | Admitting: Hospice and Palliative Medicine

## 2021-06-04 VITALS — BP 122/65 | HR 71 | Temp 98.1°F | Resp 18 | Wt 159.0 lb

## 2021-06-04 DIAGNOSIS — Z510 Encounter for antineoplastic radiation therapy: Secondary | ICD-10-CM | POA: Diagnosis not present

## 2021-06-04 DIAGNOSIS — C3490 Malignant neoplasm of unspecified part of unspecified bronchus or lung: Secondary | ICD-10-CM

## 2021-06-04 DIAGNOSIS — Z95828 Presence of other vascular implants and grafts: Secondary | ICD-10-CM

## 2021-06-04 LAB — BASIC METABOLIC PANEL
Anion gap: 7 (ref 5–15)
BUN: 14 mg/dL (ref 8–23)
CO2: 27 mmol/L (ref 22–32)
Calcium: 9.1 mg/dL (ref 8.9–10.3)
Chloride: 102 mmol/L (ref 98–111)
Creatinine, Ser: 0.5 mg/dL (ref 0.44–1.00)
GFR, Estimated: >60 mL/min (ref >60)
Glucose, Bld: 108 mg/dL — ABNORMAL HIGH (ref 70–99)
Potassium: 4.2 mmol/L (ref 3.5–5.1)
Sodium: 136 mmol/L (ref 135–145)

## 2021-06-04 LAB — CBC WITH DIFFERENTIAL/PLATELET
Abs Immature Granulocytes: 0.03 10*3/uL (ref 0.00–0.07)
Basophils Absolute: 0 10*3/uL (ref 0.0–0.1)
Basophils Relative: 0 %
Eosinophils Absolute: 0 10*3/uL (ref 0.0–0.5)
Eosinophils Relative: 1 %
HCT: 37.7 % (ref 36.0–46.0)
Hemoglobin: 12.8 g/dL (ref 12.0–15.0)
Immature Granulocytes: 1 %
Lymphocytes Relative: 18 %
Lymphs Abs: 0.6 10*3/uL — ABNORMAL LOW (ref 0.7–4.0)
MCH: 31.2 pg (ref 26.0–34.0)
MCHC: 34 g/dL (ref 30.0–36.0)
MCV: 92 fL (ref 80.0–100.0)
Monocytes Absolute: 0.2 10*3/uL (ref 0.1–1.0)
Monocytes Relative: 7 %
Neutro Abs: 2.4 10*3/uL (ref 1.7–7.7)
Neutrophils Relative %: 73 %
Platelets: 211 10*3/uL (ref 150–400)
RBC: 4.1 MIL/uL (ref 3.87–5.11)
RDW: 12.1 % (ref 11.5–15.5)
WBC: 3.4 10*3/uL — ABNORMAL LOW (ref 4.0–10.5)
nRBC: 0 % (ref 0.0–0.2)

## 2021-06-04 MED ORDER — HEPARIN SOD (PORK) LOCK FLUSH 100 UNIT/ML IV SOLN
500.0000 [IU] | Freq: Once | INTRAVENOUS | Status: AC
  Administered 2021-06-04: 500 [IU] via INTRAVENOUS
  Filled 2021-06-04: qty 5

## 2021-06-04 MED ORDER — SODIUM CHLORIDE 0.9 % IV SOLN
INTRAVENOUS | Status: AC
  Filled 2021-06-04 (×2): qty 250

## 2021-06-04 MED ORDER — SODIUM CHLORIDE 0.9% FLUSH
10.0000 mL | Freq: Once | INTRAVENOUS | Status: AC
  Administered 2021-06-04: 10 mL via INTRAVENOUS
  Filled 2021-06-04: qty 10

## 2021-06-04 NOTE — Progress Notes (Signed)
? ?Symptom Management Clinic ?[place] at [facility] ?Telephone:([phone] Fax:([phone]) [phone] ? ?Patient Care Team: ?[name], MD as PCP - General (Family Medicine) ?Telford Nab, RN as Equities trader ?Noreene Filbert, MD as Referring Physician (Radiation Oncology) ?Sindy Guadeloupe, MD as Consulting Physician (Oncology)  ? ?Name of the patient: Jennifer Hodges  ?[id number]  ?1946/01/02  ? ?Date of visit: 06/04/21 ? ?Reason for Consult: ?Jennifer Hodges is a 76 y.o. 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  ? ?Patient saw Dr. Janese Banks on 05/21/2021 at which time PET scan was reviewed with patient, unfortunately showing mediastinal and hilar recurrence after 2 years.  Patient was referred for evaluation by radiation oncology.  Plan is to proceed with additional systemic chemotherapy with carboplatin plus Alimta and patient received cycle 1 on 05/26/2021. ? ?She presents to [facility] today for interim follow-up after chemotherapy. ? ?Patient reports she is doing reasonably well.  She denies any significant changes since she received chemotherapy.  She does endorse chronic wheezing for which she is followed by Dr. Patsey Berthold but this is unchanged in characteristic or severity.  She also endorses chronic insomnia, pain, and constipation but says that all of those are being recently managed. ? ?Denies any neurologic complaints. Denies recent fevers or illnesses. Denies any easy bleeding or bruising. Reports fair appetite and denies weight loss. Denies chest pain. Denies any nausea, vomiting, or diarrhea. Denies urinary complaints. Patient offers no further specific complaints today. ? ? ? ?PAST MEDICAL HISTORY: ?Past Medical History:  ?Diagnosis Date  ? Anemia   ? Asthma   ? No  Inhalers--Dr. [name] will order as needed  ? Bronchiectasis ([place])   ? mild  ? Chronic headaches   ?  followed by [facility] migraines  ? COPD (chronic obstructive pulmonary disease) ([place])   ? DDD (degenerative disc disease), lumbar   ? Diverticulosis   ? Family history of adverse reaction to anesthesia   ? mother and sisters-PONV  ? Gall stones   ? history of  ? GERD (gastroesophageal reflux disease)   ? EGD 8/09- non bleeding erosive gastritis, documentd esophageal ulcerations.   ? Hiatal hernia   ? small  ? History of kidney stones   ? History of pneumonia   ? Hypercholesterolemia   ? IBS (irritable bowel syndrome)   ? Malignant neoplasm of upper lobe of left lung ([place]) 12/27/2018  ? Meniere disease   ? Murmur   ? Osteoarthritis   ? lumbar disc disease, left hip  ? Personal history of chemotherapy   ? Personal history of radiation therapy   ? Pneumonia 12/2020  ? PONV (postoperative nausea and vomiting)   ? Sleep apnea   ? uses cpap  ? Vertigo   ? Weakness of right side of body   ? ? ?PAST SURGICAL HISTORY:  ?Past Surgical History:  ?Procedure Laterality Date  ? ABDOMINAL HYSTERECTOMY  age 51  ? ANTERIOR CERVICAL DECOMP/DISCECTOMY FUSION N/A 02/24/2015  ? Procedure: CERVICAL FOUR-FIVE, CERVICAL FIVE-SIX, CERVICAL SIX-SEVEN ANTERIOR CERVICAL DECOMPRESSION/DISCECTOMY FUSION ;  Surgeon: [name], MD;  Location: [facility] NEUR[facility] ORS;  Service: Neurosurgery;  Laterality: N/A;  C45 C56 C67 anterior cervical decompression with fusion interbody prosthesis plating and bonegraft  ? APPENDECTOMY    ? BACK SURGERY  1988  ? 4th lumbar fusion  ?  BLADDER SURGERY N/A   ? with vaginal wall repair  ? BREAST CYST ASPIRATION Bilateral   ? neg  ? BREAST SURGERY Bilateral   ? cyst removed and reduction  ? CARDIAC CATHETERIZATION  2014  ? CARPAL TUNNEL RELEASE Right 02/11/2016  ? Procedure: CARPAL TUNNEL RELEASE;  Surgeon: [name], MD;  Location: [facility] ORS;  Service: Orthopedics;  Laterality: Right;  ? CATARACT EXTRACTION W/  INTRAOCULAR LENS IMPLANT Bilateral 2015  ? CHOLECYSTECTOMY    ? EUS N/A 05/31/2012  ? Procedure: UPPER ENDOSCOPIC ULTRASOUND (EUS) LINEAR;  Surgeon: [name], MD;  Location: [facility];  Service: Endoscopy;  Laterality: N/A;  ? EXCISIONAL HEMORRHOIDECTOMY    ? JOINT REPLACEMENT Bilateral   ? KNEE ARTHROSCOPY WITH LATERAL MENISECTOMY Right 07/07/2015  ? Procedure: KNEE ARTHROSCOPY WITH LATERAL MENISECTOMY, PARTIAL SYNOVECTOMY;  Surgeon: [name], MD;  Location: [facility] ORS;  Service: Orthopedics;  Laterality: Right;  ? LUMBAR LAMINECTOMY    ? PORTA CATH INSERTION N/A 01/07/2019  ? Procedure: PORTA CATH INSERTION;  Surgeon: [name], MD;  Location: [facility] CV LAB;  Service: Cardiovascular;  Laterality: N/A;  ? REDUCTION MAMMAPLASTY  1990  ? RIGHT OOPHORECTOMY    ? TOTAL HIP ARTHROPLASTY Left 05/01/2014  ? Dr. [name]  ? TOTAL HIP ARTHROPLASTY Right 08/05/2014  ? Procedure: TOTAL HIP ARTHROPLASTY ANTERIOR APPROACH;  Surgeon: [name], MD;  Location: [facility] ORS;  Service: Orthopedics;  Laterality: Right;  ? ULNAR NERVE TRANSPOSITION Right 02/11/2016  ? Procedure: ULNAR NERVE DECOMPRESSION/TRANSPOSITION;  Surgeon: [name], MD;  Location: [facility] ORS;  Service: Orthopedics;  Laterality: Right;  ? VIDEO BRONCHOSCOPY WITH ENDOBRONCHIAL ULTRASOUND Left 12/19/2018  ? Procedure: VIDEO BRONCHOSCOPY WITH ENDOBRONCHIAL ULTRASOUND, LEFT, SLEEP APNEA;  Surgeon: [name], MD;  Location: [facility] ORS;  Service: Thoracic;  Laterality: Left;  ? VIDEO BRONCHOSCOPY WITH ENDOBRONCHIAL ULTRASOUND Right 05/14/2021  ? Procedure: VIDEO BRONCHOSCOPY WITH ENDOBRONCHIAL ULTRASOUND;  Surgeon: Tyler Pita, MD;  Location: [facility] ORS;  Service: Pulmonary;  Laterality: Right;  ? ? ?HEMATOLOGY/ONCOLOGY HISTORY:  ?Oncology History Overview Note  ?#October 2020-stage III lung cancer-chemoradiation; currently on maintenance durvalumab. ?  ?Malignant neoplasm of upper lobe of left lung ([place])  ?12/27/2018 Initial  Diagnosis  ? Malignant neoplasm of upper lobe of left lung [facility]) ?  ?12/27/2018 Cancer Staging  ? Staging form: Lung, AJCC 8th Edition ?- Clinical stage from 12/27/2018: Stage IIIA (cT1b, cN2, cM0) - Signed by Sindy Guadeloupe, MD on 12/27/2018 ? ?  ?01/14/2019 - 03/12/2019 Chemotherapy  ? The patient had dexamethasone (DECADRON) 4 MG tablet, 8 mg, Oral, Daily, 1 of 1 cycle, Start date: 12/28/2018, End date: 03/22/2019 ?palonosetron (ALOXI) injection 0.25 mg, 0.25 mg, Intravenous,  Once, 7 of 7 cycles ?Administration: 0.25 mg (01/14/2019), 0.25 mg (01/21/2019), 0.25 mg (01/28/2019), 0.25 mg (02/04/2019), 0.25 mg (02/12/2019), 0.25 mg (02/19/2019), 0.25 mg (03/12/2019) ?CARBOplatin (PARAPLATIN) 170 mg in sodium chloride 0.9 % 100 mL chemo infusion, 170 mg (100 % of original dose 171.2 mg), Intravenous,  Once, 7 of 7 cycles ?Dose modification:   (original dose 171.2 mg, Cycle 1) ?Administration: 170 mg (01/14/2019), 170 mg (01/21/2019), 170 mg (01/28/2019), 170 mg (02/04/2019), 170 mg (02/12/2019), 170 mg (02/19/2019), 130 mg (03/12/2019) ?PACLitaxel (TAXOL) 84 mg in sodium chloride 0.9 % 250 mL chemo infusion (</= 80mg /m2), 45 mg/m2 = 84 mg, Intravenous,  Once, 7 of 7 cycles ?Administration: 84 mg (01/14/2019), 84 mg (01/21/2019), 84 mg (01/28/2019), 84 mg (02/04/2019), 84 mg (02/12/2019), 84 mg (02/19/2019), 84 mg (03/12/2019) ? ?  for chemotherapy treatment.  ? ?  ?04/04/2019 - 04/30/2020 Chemotherapy  ? Patient is on Treatment Plan : LUNG DURVALUMAB P82U  ?   ?05/26/2021 -  Chemotherapy  ? Patient is on Treatment Plan : LUNG NSCLC Pemetrexed + Carboplatin q21d x 4 Cycles  ?   ? ? ?ALLERGIES:  is allergic to aspirin, celebrex [celecoxib], morphine and related, adhesive [tape], clarithromycin, codeine, darvon [propoxyphene hcl], demerol [meperidine], flonase [fluticasone propionate], simvastatin, and talwin [pentazocine]. ? ?MEDICATIONS:  ?Current Outpatient Medications  ?Medication Sig Dispense Refill  ? albuterol (VENTOLIN HFA) 108  (90 Base) MCG/ACT inhaler Inhale 2 puffs into the lungs every 6 (six) hours as needed for wheezing or shortness of breath.    ? Bioflavonoid Products (ESTER C PO) Take 1 tablet by mouth 3 (three) times daily. 500 mg

## 2021-06-07 ENCOUNTER — Telehealth: Payer: Self-pay | Admitting: Pulmonary Disease

## 2021-06-07 ENCOUNTER — Ambulatory Visit
Admission: RE | Admit: 2021-06-07 | Discharge: 2021-06-07 | Disposition: A | Discharge status: Home / Self Care | Payer: Medicare Other | Source: Ambulatory Visit | Attending: Radiation Oncology | Admitting: Radiation Oncology

## 2021-06-07 ENCOUNTER — Telehealth: Payer: Self-pay

## 2021-06-07 DIAGNOSIS — Z510 Encounter for antineoplastic radiation therapy: Secondary | ICD-10-CM | POA: Diagnosis not present

## 2021-06-07 MED ORDER — STIOLTO RESPIMAT 2.5-2.5 MCG/ACT IN AERS: 2.0000 | INHALATION_SPRAY | Freq: Every day | RESPIRATORY_TRACT | 10 refills | Status: DC

## 2021-06-07 NOTE — Telephone Encounter (Signed)
Called and canceled RX at [facility]. Nothing further needed.  ?

## 2021-06-07 NOTE — Telephone Encounter (Signed)
Called and LVM for patient in regards of stiolto inhaler. [facility]. Nothing further needed. ?

## 2021-06-08 ENCOUNTER — Ambulatory Visit
Admission: RE | Admit: 2021-06-08 | Discharge: 2021-06-08 | Disposition: A | Discharge status: Home / Self Care | Payer: Medicare Other | Source: Ambulatory Visit | Attending: Radiation Oncology | Admitting: Radiation Oncology

## 2021-06-08 DIAGNOSIS — Z510 Encounter for antineoplastic radiation therapy: Secondary | ICD-10-CM | POA: Diagnosis not present

## 2021-06-09 ENCOUNTER — Ambulatory Visit
Admission: RE | Admit: 2021-06-09 | Discharge: 2021-06-09 | Disposition: A | Discharge status: Home / Self Care | Payer: Medicare Other | Source: Ambulatory Visit | Attending: Radiation Oncology | Admitting: Radiation Oncology

## 2021-06-09 DIAGNOSIS — Z510 Encounter for antineoplastic radiation therapy: Secondary | ICD-10-CM | POA: Diagnosis not present

## 2021-06-10 ENCOUNTER — Ambulatory Visit
Admission: RE | Admit: 2021-06-10 | Discharge: 2021-06-10 | Disposition: A | Discharge status: Home / Self Care | Payer: Medicare Other | Source: Ambulatory Visit | Attending: Radiation Oncology | Admitting: Radiation Oncology

## 2021-06-10 DIAGNOSIS — Z510 Encounter for antineoplastic radiation therapy: Secondary | ICD-10-CM | POA: Diagnosis not present

## 2021-06-11 ENCOUNTER — Ambulatory Visit
Admission: RE | Admit: 2021-06-11 | Discharge: 2021-06-11 | Disposition: A | Discharge status: Home / Self Care | Payer: Medicare Other | Source: Ambulatory Visit | Attending: Radiation Oncology | Admitting: Radiation Oncology

## 2021-06-11 DIAGNOSIS — Z510 Encounter for antineoplastic radiation therapy: Secondary | ICD-10-CM | POA: Diagnosis not present

## 2021-06-14 ENCOUNTER — Ambulatory Visit
Admission: RE | Admit: 2021-06-14 | Discharge: 2021-06-14 | Disposition: A | Discharge status: Home / Self Care | Payer: Medicare Other | Source: Ambulatory Visit | Attending: Radiation Oncology | Admitting: Radiation Oncology

## 2021-06-14 DIAGNOSIS — C3411 Malignant neoplasm of upper lobe, right bronchus or lung: Secondary | ICD-10-CM | POA: Insufficient documentation

## 2021-06-14 DIAGNOSIS — Z5111 Encounter for antineoplastic chemotherapy: Secondary | ICD-10-CM | POA: Diagnosis not present

## 2021-06-14 DIAGNOSIS — C3412 Malignant neoplasm of upper lobe, left bronchus or lung: Secondary | ICD-10-CM | POA: Insufficient documentation

## 2021-06-14 DIAGNOSIS — Z510 Encounter for antineoplastic radiation therapy: Secondary | ICD-10-CM | POA: Diagnosis not present

## 2021-06-14 DIAGNOSIS — D701 Agranulocytosis secondary to cancer chemotherapy: Secondary | ICD-10-CM | POA: Diagnosis not present

## 2021-06-15 ENCOUNTER — Ambulatory Visit
Admission: RE | Admit: 2021-06-15 | Discharge: 2021-06-15 | Disposition: A | Discharge status: Home / Self Care | Payer: Medicare Other | Source: Ambulatory Visit | Attending: Radiation Oncology | Admitting: Radiation Oncology

## 2021-06-15 ENCOUNTER — Encounter: Payer: Self-pay | Admitting: Pulmonary Disease

## 2021-06-15 ENCOUNTER — Ambulatory Visit (INDEPENDENT_AMBULATORY_CARE_PROVIDER_SITE_OTHER): Payer: Medicare Other | Admitting: Pulmonary Disease

## 2021-06-15 VITALS — BP 124/78 | HR 82 | Temp 97.6°F | Ht 61.0 in | Wt 161.0 lb

## 2021-06-15 DIAGNOSIS — J209 Acute bronchitis, unspecified: Secondary | ICD-10-CM

## 2021-06-15 DIAGNOSIS — C3412 Malignant neoplasm of upper lobe, left bronchus or lung: Secondary | ICD-10-CM

## 2021-06-15 DIAGNOSIS — J440 Chronic obstructive pulmonary disease with acute lower respiratory infection: Secondary | ICD-10-CM

## 2021-06-15 DIAGNOSIS — Z510 Encounter for antineoplastic radiation therapy: Secondary | ICD-10-CM | POA: Diagnosis not present

## 2021-06-15 DIAGNOSIS — J449 Chronic obstructive pulmonary disease, unspecified: Secondary | ICD-10-CM

## 2021-06-15 MED ORDER — AZITHROMYCIN 250 MG PO TABS: ORAL_TABLET | ORAL | 0 refills | Status: AC

## 2021-06-15 NOTE — Patient Instructions (Signed)
It was a pleasure taking care of you. ? ?We will see you in follow-up on an as-needed basis.  Make sure you make an appointment with Dr. [name] within 2 to 3 months. ? ?I have sent a prescription for an antibiotic to your pharmacy. ? ? ?

## 2021-06-15 NOTE — Progress Notes (Signed)
Subjective:    Patient ID: Jennifer Hodges, female    DOB: Jan 20, 1946, 76 y.o.   MRN: [id number] Patient Care Team: Marina Goodell, MD as PCP - General (Family Medicine) Glory Buff, RN as Registered Nurse Carmina Miller, MD as Referring Physician (Radiation Oncology) Creig Hines, MD as Consulting Physician (Oncology)  Chief Complaint  Patient presents with   Follow-up    Occ sob with talking and exertion, wheezing and prod cough with light green sputum   HPI Patient is a 76 year old former smoker (40 PY) history of multifocal adenocarcinoma diagnosed in October of 2020, she was at that time stage III and had concurrent chemoradiation followed by maintenance durvalumab.  She presents after EBUS with TBNA performed 14 May 2021. 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 She tolerated the procedure well without issues.   She is doing well with the Stiolto that was provided for her during her initial visit with me.  She has not had any fevers, chills or sweats.  No weight loss or anorexia.  She has not had any chest pain.  No lower extremity edema.  No calf tenderness.  She has continued to expectorate yellowish sputum.  She had negative culture from her bronchoscopy.   She does not endorse any other symptomatology.  She follows with Dr. Ned Clines and will continue follow-up with him with regards to her pulmonary issues.   Review of Systems A 10 point review of systems was performed and it is as noted above otherwise negative.  Current Meds  Medication Sig   albuterol (VENTOLIN HFA) 108 (90 Base) MCG/ACT inhaler Inhale 2 puffs into the lungs every 6 (six) hours as needed for wheezing or shortness of breath.   Bioflavonoid Products (ESTER C PO) Take 1 tablet by mouth 3 (three) times daily. 500 mg   Calcium-Magnesium-Zinc (CAL-MAG-ZINC PO) Take 1 tablet by mouth daily.   carboxymethylcellulose (REFRESH PLUS) 0.5 % SOLN Place 1 drop into both eyes 3  (three) times daily as needed (dry eyes).   cetirizine (ZYRTEC) 5 MG tablet Take 1 tablet (5 mg total) by mouth daily.   Cholecalciferol (EQL VITAMIN D3) 25 MCG (1000 UT) capsule Take 1,000 Units by mouth daily.   Coenzyme Q10 (CO Q 10) 100 MG CAPS Take 100 mg by mouth daily.    Cyanocobalamin (VITAMIN B-12) 2500 MCG SUBL Place 2,500 mcg under the tongue daily.   cyclobenzaprine (FLEXERIL) 10 MG tablet Take 10 mg by mouth at bedtime.   dexamethasone (DECADRON) 4 MG tablet Take 1 tab every 12 hours the day before pemetrexed chemo, then take 2 tabs once a day for 3 days starting the day after carboplatin.   DHEA 25 MG CAPS Take 25 mg by mouth daily.   ezetimibe (ZETIA) 10 MG tablet Take 5 mg by mouth in the morning and at bedtime.    folic acid (FOLVITE) 1 MG tablet Take 1 tablet (1 mg total) by mouth daily. Start 7 days before pemetrexed chemotherapy. Continue until 21 days after pemetrexed completed.   folic acid (FOLVITE) 400 MCG tablet Take 400 mcg by mouth daily.   Garlic 1000 MG CAPS Take 1,000 mg by mouth daily.   Histamine Dihydrochloride (AUSTRALIAN DREAM ARTHRITIS) 0.025 % CREA Apply 1 application topically 4 (four) times daily as needed (pain).   HYDROcodone-acetaminophen (NORCO/VICODIN) 5-325 MG tablet 1-2 tabs po bid prn (Patient taking differently: Take 1 tablet by mouth at bedtime.)   levOCARNitine (L-CARNITINE) 250 MG TABS  Take 1 tablet by mouth daily. With Chromium 250 mg   lidocaine-prilocaine (EMLA) cream Apply to affected area once   meclizine (ANTIVERT) 25 MG tablet Take 1 tablet (25 mg total) by mouth every 6 (six) hours as needed for dizziness.   ondansetron (ZOFRAN) 8 MG tablet Take 1 tablet (8 mg total) by mouth 2 (two) times daily as needed (Nausea or vomiting). Start if needed on the third day after carboplatin.   pantoprazole (PROTONIX) 40 MG tablet Take 40 mg by mouth 2 (two) times daily.   polyethylene glycol powder (GLYCOLAX/MIRALAX) 17 GM/SCOOP powder Take 17 g by  mouth daily as needed for moderate constipation.   prochlorperazine (COMPAZINE) 10 MG tablet Take 1 tablet (10 mg total) by mouth every 6 (six) hours as needed (Nausea or vomiting).   pyridOXINE (VITAMIN B-6) 100 MG tablet Take 100 mg by mouth daily.   Selenium 200 MCG CAPS Take 200 mcg by mouth daily.   Sennosides (SENNA) 8.6 MG CAPS Take 1 capsule by mouth daily as needed (constipation).   sucralfate (CARAFATE) 1 GM/10ML suspension Take 1 g by mouth daily.   Tiotropium Bromide-Olodaterol (STIOLTO RESPIMAT) 2.5-2.5 MCG/ACT AERS Inhale 2 puffs into the lungs daily.   triamcinolone (NASACORT) 55 MCG/ACT AERO nasal inhaler Place 2 sprays into the nose daily.   UBRELVY 100 MG TABS Take 100 mg by mouth daily as needed (migraines).   Vitamin A 2400 MCG (8000 UT) CAPS Take 8,000 Units by mouth daily.   Vitamin E 200 units TABS Take 200 Units by mouth daily.       Objective:   Physical Exam BP 124/78 (BP Location: Left Arm, Cuff Size: Normal)   Pulse 82   Temp 97.6 F (36.4 C) (Temporal)   Ht 5\' 1"  (1.549 m)   Wt 161 lb (73 kg)   SpO2 98%   BMI 30.42 kg/m   SpO2: 98 % O2 Device: None (Room air)  GENERAL: Well-developed somewhat overweight woman, no acute distress.  Ambulatory with assistance of a walker.  No conversational dyspnea. HEAD: Normocephalic, atraumatic.  EYES: Pupils equal, round, reactive to light.  No scleral icterus.  MOUTH: No thrush noted today. NECK: Supple. No thyromegaly. Trachea midline. No JVD.  No adenopathy. PULMONARY: Good air entry bilaterally.  No adventitious sounds. CARDIOVASCULAR: S1 and S2. Regular rate and rhythm.  No rubs, murmurs or gallops heard. ABDOMEN: Obese otherwise benign. MUSCULOSKELETAL: No joint deformity, no clubbing, no edema.  NEUROLOGIC: No overt focal deficit, no gait disturbance, speech is fluent. SKIN: Intact,warm,dry. PSYCH: Mood and behavior normal.      Assessment & Plan:     ICD-10-CM   1. Malignant neoplasm of upper lobe of  left lung [facility])  C34.12    Patient will continue to follow-up with oncology    2. Acute bronchitis with COPD (HCC)  J44.0    J20.9    Azithromycin Dosepak    3. COPD, moderate (H[facility])  J44.9    Continue Stiolto Continue as needed albuterol Continue follow-up with Dr. Meredeth Ide     Meds ordered this encounter  Medications   azithromycin (ZITHROMAX) 250 MG tablet    Sig: Take 2 tablets (500 mg) on  Day 1,  followed by 1 tablet (250 mg) once daily on Days 2 through 5.    Dispense:  6 each    Refill:  0   Patient will follow-up with Dr. Ned Clines.  She is to return to the [employer] as needed.  C.  Danice Goltz, MD Advanced Bronchoscopy PCCM [facility]    *This note was dictated using voice recognition software/Dragon.  Despite best efforts to proofread, errors can occur which can change the meaning. Any transcriptional errors that result from this process are unintentional and may not be fully corrected at the time of dictation.

## 2021-06-16 ENCOUNTER — Inpatient Hospital Stay: Payer: Medicare Other

## 2021-06-16 ENCOUNTER — Inpatient Hospital Stay (HOSPITAL_BASED_OUTPATIENT_CLINIC_OR_DEPARTMENT_OTHER): Payer: Medicare Other | Admitting: Oncology

## 2021-06-16 ENCOUNTER — Encounter: Payer: Self-pay | Admitting: Oncology

## 2021-06-16 ENCOUNTER — Ambulatory Visit
Admission: RE | Admit: 2021-06-16 | Discharge: 2021-06-16 | Disposition: A | Discharge status: Home / Self Care | Payer: Medicare Other | Source: Ambulatory Visit | Attending: Radiation Oncology | Admitting: Radiation Oncology

## 2021-06-16 VITALS — BP 120/70 | HR 82 | Temp 97.2°F | Resp 16 | Ht 61.0 in | Wt 161.7 lb

## 2021-06-16 DIAGNOSIS — F419 Anxiety disorder, unspecified: Secondary | ICD-10-CM | POA: Insufficient documentation

## 2021-06-16 DIAGNOSIS — J209 Acute bronchitis, unspecified: Secondary | ICD-10-CM

## 2021-06-16 DIAGNOSIS — C3412 Malignant neoplasm of upper lobe, left bronchus or lung: Secondary | ICD-10-CM

## 2021-06-16 DIAGNOSIS — Z5111 Encounter for antineoplastic chemotherapy: Secondary | ICD-10-CM | POA: Insufficient documentation

## 2021-06-16 DIAGNOSIS — M961 Postlaminectomy syndrome, not elsewhere classified: Secondary | ICD-10-CM | POA: Insufficient documentation

## 2021-06-16 DIAGNOSIS — R600 Localized edema: Secondary | ICD-10-CM | POA: Insufficient documentation

## 2021-06-16 DIAGNOSIS — Z510 Encounter for antineoplastic radiation therapy: Secondary | ICD-10-CM | POA: Diagnosis not present

## 2021-06-16 DIAGNOSIS — C3490 Malignant neoplasm of unspecified part of unspecified bronchus or lung: Secondary | ICD-10-CM

## 2021-06-16 DIAGNOSIS — D701 Agranulocytosis secondary to cancer chemotherapy: Secondary | ICD-10-CM | POA: Insufficient documentation

## 2021-06-16 LAB — COMPREHENSIVE METABOLIC PANEL
ALT: 21 U/L (ref 0–44)
AST: 17 U/L (ref 15–41)
Albumin: 3.7 g/dL (ref 3.5–5.0)
Alkaline Phosphatase: 67 U/L (ref 38–126)
Anion gap: 5 (ref 5–15)
BUN: 17 mg/dL (ref 8–23)
CO2: 27 mmol/L (ref 22–32)
Calcium: 8.5 mg/dL — ABNORMAL LOW (ref 8.9–10.3)
Chloride: 102 mmol/L (ref 98–111)
Creatinine, Ser: 0.5 mg/dL (ref 0.44–1.00)
GFR, Estimated: >60 mL/min (ref >60)
Glucose, Bld: 103 mg/dL — ABNORMAL HIGH (ref 70–99)
Potassium: 4 mmol/L (ref 3.5–5.1)
Sodium: 134 mmol/L — ABNORMAL LOW (ref 135–145)
Total Bilirubin: 0.6 mg/dL (ref 0.3–1.2)
Total Protein: 6.2 g/dL — ABNORMAL LOW (ref 6.5–8.1)

## 2021-06-16 LAB — CBC WITH DIFFERENTIAL/PLATELET
Abs Immature Granulocytes: 0.03 10*3/uL (ref 0.00–0.07)
Basophils Absolute: 0 10*3/uL (ref 0.0–0.1)
Basophils Relative: 0 %
Eosinophils Absolute: 0 10*3/uL (ref 0.0–0.5)
Eosinophils Relative: 1 %
HCT: 38 % (ref 36.0–46.0)
Hemoglobin: 12.7 g/dL (ref 12.0–15.0)
Immature Granulocytes: 1 %
Lymphocytes Relative: 9 %
Lymphs Abs: 0.5 10*3/uL — ABNORMAL LOW (ref 0.7–4.0)
MCH: 31.2 pg (ref 26.0–34.0)
MCHC: 33.4 g/dL (ref 30.0–36.0)
MCV: 93.4 fL (ref 80.0–100.0)
Monocytes Absolute: 0.5 10*3/uL (ref 0.1–1.0)
Monocytes Relative: 9 %
Neutro Abs: 4.6 10*3/uL (ref 1.7–7.7)
Neutrophils Relative %: 80 %
Platelets: 228 10*3/uL (ref 150–400)
RBC: 4.07 MIL/uL (ref 3.87–5.11)
RDW: 13.1 % (ref 11.5–15.5)
WBC: 5.7 10*3/uL (ref 4.0–10.5)
nRBC: 0 % (ref 0.0–0.2)

## 2021-06-16 MED ORDER — HEPARIN SOD (PORK) LOCK FLUSH 100 UNIT/ML IV SOLN
500.0000 [IU] | Freq: Once | INTRAVENOUS | Status: AC
  Administered 2021-06-16: 500 [IU] via INTRAVENOUS
  Filled 2021-06-16: qty 5

## 2021-06-16 MED ORDER — PALONOSETRON HCL INJECTION 0.25 MG/5ML
0.2500 mg | Freq: Once | INTRAVENOUS | Status: AC
  Administered 2021-06-16: 0.25 mg via INTRAVENOUS
  Filled 2021-06-16: qty 5

## 2021-06-16 MED ORDER — SODIUM CHLORIDE 0.9 % IV SOLN
500.0000 mg/m2 | Freq: Once | INTRAVENOUS | Status: AC
  Administered 2021-06-16: 900 mg via INTRAVENOUS
  Filled 2021-06-16: qty 20

## 2021-06-16 MED ORDER — HEPARIN SOD (PORK) LOCK FLUSH 100 UNIT/ML IV SOLN
INTRAVENOUS | Status: AC
  Filled 2021-06-16: qty 5

## 2021-06-16 MED ORDER — SODIUM CHLORIDE 0.9 % IV SOLN
Freq: Once | INTRAVENOUS | Status: AC
  Filled 2021-06-16: qty 250

## 2021-06-16 MED ORDER — SODIUM CHLORIDE 0.9 % IV SOLN
390.0000 mg | Freq: Once | INTRAVENOUS | Status: AC
  Administered 2021-06-16: 390 mg via INTRAVENOUS
  Filled 2021-06-16: qty 39

## 2021-06-16 MED ORDER — SODIUM CHLORIDE 0.9 % IV SOLN
10.0000 mg | Freq: Once | INTRAVENOUS | Status: AC
  Administered 2021-06-16: 10 mg via INTRAVENOUS
  Filled 2021-06-16: qty 10

## 2021-06-16 MED ORDER — SODIUM CHLORIDE 0.9 % IV SOLN
150.0000 mg | Freq: Once | INTRAVENOUS | Status: AC
  Administered 2021-06-16: 150 mg via INTRAVENOUS
  Filled 2021-06-16: qty 150

## 2021-06-16 MED ORDER — SODIUM CHLORIDE 0.9% FLUSH
10.0000 mL | Freq: Once | INTRAVENOUS | Status: AC
  Administered 2021-06-16: 10 mL via INTRAVENOUS
  Filled 2021-06-16: qty 10

## 2021-06-16 MED ORDER — HEPARIN SOD (PORK) LOCK FLUSH 100 UNIT/ML IV SOLN
500.0000 [IU] | Freq: Once | INTRAVENOUS | Status: DC | PRN
  Filled 2021-06-16: qty 5

## 2021-06-16 NOTE — Patient Instructions (Signed)
[facility] CANCER CTR AT [place]  Discharge Instructions: ?Thank you for choosing [facility] to provide your oncology and hematology care.  ?If you have a lab appointment with the [facility], please go directly to the [facility] and check in at the registration area. ? ?Wear comfortable clothing and clothing appropriate for easy access to any Portacath or PICC line.  ? ?We strive to give you quality time with your provider. You may need to reschedule your appointment if you arrive late (15 or more minutes).  Arriving late affects you and other patients whose appointments are after yours.  Also, if you miss three or more appointments without notifying the office, you may be dismissed from the clinic at the provider?s discretion.    ?  ?For prescription refill requests, have your pharmacy contact our office and allow 72 hours for refills to be completed.   ? ?Today you received the following chemotherapy and/or immunotherapy agents Alimta and Carboplatin  ?    ?  ?To help prevent nausea and vomiting after your treatment, we encourage you to take your nausea medication as directed. ? ?BELOW ARE SYMPTOMS THAT SHOULD BE REPORTED IMMEDIATELY: ?*FEVER GREATER THAN 100.4 F (38 ?C) OR HIGHER ?*CHILLS OR SWEATING ?*NAUSEA AND VOMITING THAT IS NOT CONTROLLED WITH YOUR NAUSEA MEDICATION ?*UNUSUAL SHORTNESS OF BREATH ?*UNUSUAL BRUISING OR BLEEDING ?*URINARY PROBLEMS (pain or burning when urinating, or frequent urination) ?*BOWEL PROBLEMS (unusual diarrhea, constipation, pain near the anus) ?TENDERNESS IN MOUTH AND THROAT WITH OR WITHOUT PRESENCE OF ULCERS (sore throat, sores in mouth, or a toothache) ?UNUSUAL RASH, SWELLING OR PAIN  ?UNUSUAL VAGINAL DISCHARGE OR ITCHING  ? ?Items with * indicate a potential emergency and should be followed up as soon as possible or go to the Emergency Department if any problems should occur. ? ?Please show the CHEMOTHERAPY ALERT CARD or IMMUNOTHERAPY ALERT CARD  at check-in to the Emergency Department and triage nurse. ? ?Should you have questions after your visit or need to cancel or reschedule your appointment, please contact [facility] CANCER [place] AT [place]  [phone] and follow the prompts.  Office hours are 8:00 a.m. to 4:30 p.m. Monday - Friday. Please note that voicemails left after 4:00 p.m. may not be returned until the following business day.  We are closed weekends and major holidays. You have access to a nurse at all times for urgent questions. Please call the main number to the clinic [phone] and follow the prompts. ? ?For any non-urgent questions, you may also contact your provider using MyChart. We now offer e-Visits for anyone 68 and older to request care online for non-urgent symptoms. For details visit [email or web address]. ?  ?Also download the MyChart app! Go to the app store, search "MyChart", open the app, select [facility], and log in with your MyChart username and password. ? ?Due to Covid, a mask is required upon entering the hospital/clinic. If you do not have a mask, one will be given to you upon arrival. For doctor visits, patients may have 1 support person aged 37 or older with them. For treatment visits, patients cannot have anyone with them due to current Covid guidelines and our immunocompromised population.  ?

## 2021-06-17 ENCOUNTER — Ambulatory Visit
Admission: RE | Admit: 2021-06-17 | Discharge: 2021-06-17 | Disposition: A | Discharge status: Home / Self Care | Payer: Medicare Other | Source: Ambulatory Visit | Attending: Radiation Oncology | Admitting: Radiation Oncology

## 2021-06-17 ENCOUNTER — Encounter: Payer: Self-pay | Admitting: Oncology

## 2021-06-17 DIAGNOSIS — Z510 Encounter for antineoplastic radiation therapy: Secondary | ICD-10-CM | POA: Diagnosis not present

## 2021-06-17 NOTE — Progress Notes (Signed)
? ? ? ?Hematology/Oncology Consult note ?[place]  ?[phone] [phone] ? ?Patient Care Team: ?[name], MD as PCP - General (Family Medicine) ?Telford Nab, RN as Equities trader ?Noreene Filbert, MD as Referring Physician (Radiation Oncology) ?Sindy Guadeloupe, MD as Consulting Physician (Oncology)  ? ?Name of the patient: Jennifer Hodges  ?[id number]  ?October 09, 1945  ? ?Date of visit: 06/17/21 ? 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 ?Chief complaint/ Reason for visit-on treatment assessment prior to cycle 2 of carbo Alimta chemotherapy ? ?Heme/Onc history: Patient is a 76 year old female diagnosed with T1b N2 M0 stage III adenocarcinoma of the lung in September 2020.  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  EGFR and Ros as well as MET testing negative. PD-L1 was 50% ?  ?Patient completed concurrent chemoradiation with weekly carbotaxol chemotherapy on 03/12/2019.  Maintenance durvalumab started in January 2021 after scan showed partial response.  Patient completed 1 year of maintenance durvalumab in February 2022 ?  ?Patient was found to have enlarging mediastinal adenopathyOn subsequent CT scan in January 2023.  This was followed by a PET scan which showed hypermetabolic mediastinal and right hilar lymph nodes but no other evidence of disease elsewhere.  This was biopsied and was consistent with non-small cell lung cancer specifically adenocarcinoma.  Cells were positive for TTF-1 and Napsin A and negative for p40. ?  ?Plan is to proceed with concurrent chemoradiation with carboplatin and Alimta with consideration for maintenance Alimta ? ?Interval history-tolerating chemotherapy well.  She was recently seen by Dr. Patsey Berthold for another episode of bronchitis and was given a dose of Z-Pak.  Otherwise she is doing  well and denies any other complaints at this time ? ?ECOG PS- 1 ?Pain scale- 0 ? ? ?Review of systems- Review of Systems  ?Constitutional:  Negative for chills, fever, malaise/fatigue and weight loss.  ?HENT:  Negative for congestion, ear discharge and nosebleeds.   ?Eyes:  Negative for blurred vision.  ?Respiratory:  Positive for cough. Negative for hemoptysis, sputum production, shortness of breath and wheezing.   ?Cardiovascular:  Negative for chest pain, palpitations, orthopnea and claudication.  ?Gastrointestinal:  Negative for abdominal pain, blood in stool, constipation, diarrhea, heartburn, melena, nausea and vomiting.  ?Genitourinary:  Negative for dysuria, flank pain, frequency, hematuria and urgency.  ?Musculoskeletal:  Negative for back pain, joint pain and myalgias.  ?Skin:  Negative for rash.  ?Neurological:  Negative for dizziness, tingling, focal weakness, seizures, weakness and headaches.  ?Endo/Heme/Allergies:  Does not bruise/bleed easily.  ?Psychiatric/Behavioral:  Negative for depression and suicidal ideas. The patient does not have insomnia.    ? ? ? ?Allergies  ?Allergen Reactions  ? Aspirin Hives, Shortness Of Breath and Other (See Comments)  ?  Difficulty breathing  ? Celebrex [Celecoxib] Shortness Of Breath  ? Morphine And Related Nausea And Vomiting and Swelling  ? Adhesive [Tape] Other (See Comments)  ?  Took top layer of skin off when removed.  ? Clarithromycin Nausea And Vomiting  ? Codeine Nausea And Vomiting  ? Darvon [Propoxyphene Hcl] Nausea And Vomiting  ? Demerol [Meperidine] Nausea And Vomiting  ? Flonase [Fluticasone Propionate] Other (See Comments)  ?  Fungal infection in nose  ? Simvastatin Other (See Comments)  ?  "caused ulcers in mouth, and fever"  ? Talwin [Pentazocine] Nausea And Vomiting  ? ? ? ?Past Medical History:  ?  Diagnosis Date  ? Anemia   ? Asthma   ? No Inhalers--Dr. [name] will order as needed  ? Bronchiectasis ([place])   ? mild  ? Chronic headaches   ?  followed  by [facility] migraines  ? COPD (chronic obstructive pulmonary disease) ([place])   ? DDD (degenerative disc disease), lumbar   ? Diverticulosis   ? Family history of adverse reaction to anesthesia   ? mother and sisters-PONV  ? Gall stones   ? history of  ? GERD (gastroesophageal reflux disease)   ? EGD 8/09- non bleeding erosive gastritis, documentd esophageal ulcerations.   ? Hiatal hernia   ? small  ? History of kidney stones   ? History of pneumonia   ? Hypercholesterolemia   ? IBS (irritable bowel syndrome)   ? Malignant neoplasm of upper lobe of left lung ([place]) 12/27/2018  ? Meniere disease   ? Murmur   ? Osteoarthritis   ? lumbar disc disease, left hip  ? Personal history of chemotherapy   ? Personal history of radiation therapy   ? Pneumonia 12/2020  ? PONV (postoperative nausea and vomiting)   ? Sleep apnea   ? uses cpap  ? Vertigo   ? Weakness of right side of body   ? ? ? ?Past Surgical History:  ?Procedure Laterality Date  ? ABDOMINAL HYSTERECTOMY  age 27  ? ANTERIOR CERVICAL DECOMP/DISCECTOMY FUSION N/A 02/24/2015  ? Procedure: CERVICAL FOUR-FIVE, CERVICAL FIVE-SIX, CERVICAL SIX-SEVEN ANTERIOR CERVICAL DECOMPRESSION/DISCECTOMY FUSION ;  Surgeon: [name], MD;  Location: [facility] NEURO ORS;  Service: Neurosurgery;  Laterality: N/A;  C45 C56 C67 anterior cervical decompression with fusion interbody prosthesis plating and bonegraft  ? APPENDECTOMY    ? BACK SURGERY  1988  ? 4th lumbar fusion  ? BLADDER SURGERY N/A   ? with vaginal wall repair  ? BREAST CYST ASPIRATION Bilateral   ? neg  ? BREAST SURGERY Bilateral   ? cyst removed and reduction  ? CARDIAC CATHETERIZATION  2014  ? CARPAL TUNNEL RELEASE Right 02/11/2016  ? Procedure: CARPAL TUNNEL RELEASE;  Surgeon: [name], MD;  Location: [facility] ORS;  Service: Orthopedics;  Laterality: Right;  ? CATARACT EXTRACTION W/ INTRAOCULAR LENS IMPLANT Bilateral 2015  ? CHOLECYSTECTOMY    ? EUS N/A 05/31/2012  ? Procedure: UPPER ENDOSCOPIC ULTRASOUND (EUS)  LINEAR;  Surgeon: [name], MD;  Location: [facility] ENDOSCOPY;  Service: Endoscopy;  Laterality: N/A;  ? EXCISIONAL HEMORRHOIDECTOMY    ? JOINT REPLACEMENT Bilateral   ? KNEE ARTHROSCOPY WITH LATERAL MENISECTOMY Right 07/07/2015  ? Procedure: KNEE ARTHROSCOPY WITH LATERAL MENISECTOMY, PARTIAL SYNOVECTOMY;  Surgeon: [name], MD;  Location: [facility] ORS;  Service: Orthopedics;  Laterality: Right;  ? LUMBAR LAMINECTOMY    ? PORTA CATH INSERTION N/A 01/07/2019  ? Procedure: PORTA CATH INSERTION;  Surgeon: [name], MD;  Location: [facility] CV LAB;  Service: Cardiovascular;  Laterality: N/A;  ? REDUCTION MAMMAPLASTY  1990  ? RIGHT OOPHORECTOMY    ? TOTAL HIP ARTHROPLASTY Left 05/01/2014  ? Dr. [name]  ? TOTAL HIP ARTHROPLASTY Right 08/05/2014  ? Procedure: TOTAL HIP ARTHROPLASTY ANTERIOR APPROACH;  Surgeon: [name], MD;  Location: [facility] ORS;  Service: Orthopedics;  Laterality: Right;  ? ULNAR NERVE TRANSPOSITION Right 02/11/2016  ? Procedure: ULNAR NERVE DECOMPRESSION/TRANSPOSITION;  Surgeon: [name], MD;  Location: [facility] ORS;  Service: Orthopedics;  Laterality: Right;  ? VIDEO BRONCHOSCOPY WITH ENDOBRONCHIAL ULTRASOUND Left 12/19/2018  ? Procedure: VIDEO BRONCHOSCOPY WITH ENDOBRONCHIAL ULTRASOUND, LEFT,  SLEEP APNEA;  Surgeon: [name], MD;  Location: [facility] ORS;  Service: Thoracic;  Laterality: Left;  ? VIDEO BRONCHOSCOPY WITH ENDOBRONCHIAL ULTRASOUND Right 05/14/2021  ? Procedure: VIDEO BRONCHOSCOPY WITH ENDOBRONCHIAL ULTRASOUND;  Surgeon: Tyler Pita, MD;  Location: [facility] ORS;  Service: Pulmonary;  Laterality: Right;  ? ? ?Social History  ? ?Socioeconomic History  ? Marital status: Married  ?  Spouse name: Not on file  ? Number of children: Not on file  ? Years of education: Not on file  ? Highest education level: Not on file  ?Occupational History  ? Not on file  ?Tobacco Use  ? Smoking status: Former  ?  Packs/day: 1.00  ?  Years: 40.00  ?  Pack years: 40.00  ?  Types: Cigarettes  ?   Quit date: 06/13/2007  ?  Years since quitting: 14.0  ? Smokeless tobacco: Never  ?Vaping Use  ? Vaping Use: Never used  ?Substance and Sexual Activity  ? Alcohol use: No  ?  Alcohol/week: 0.0 standard d

## 2021-06-18 ENCOUNTER — Ambulatory Visit
Admission: RE | Admit: 2021-06-18 | Discharge: 2021-06-18 | Disposition: A | Discharge status: Home / Self Care | Payer: Medicare Other | Source: Ambulatory Visit | Attending: Radiation Oncology | Admitting: Radiation Oncology

## 2021-06-18 DIAGNOSIS — Z510 Encounter for antineoplastic radiation therapy: Secondary | ICD-10-CM | POA: Diagnosis not present

## 2021-06-21 ENCOUNTER — Ambulatory Visit
Admission: RE | Admit: 2021-06-21 | Discharge: 2021-06-21 | Disposition: A | Discharge status: Home / Self Care | Payer: Medicare Other | Source: Ambulatory Visit | Attending: Radiation Oncology | Admitting: Radiation Oncology

## 2021-06-21 DIAGNOSIS — Z510 Encounter for antineoplastic radiation therapy: Secondary | ICD-10-CM | POA: Diagnosis not present

## 2021-06-22 ENCOUNTER — Ambulatory Visit
Admission: RE | Admit: 2021-06-22 | Discharge: 2021-06-22 | Disposition: A | Discharge status: Home / Self Care | Payer: Medicare Other | Source: Ambulatory Visit | Attending: Radiation Oncology | Admitting: Radiation Oncology

## 2021-06-22 DIAGNOSIS — Z510 Encounter for antineoplastic radiation therapy: Secondary | ICD-10-CM | POA: Diagnosis not present

## 2021-06-23 ENCOUNTER — Ambulatory Visit
Admission: RE | Admit: 2021-06-23 | Discharge: 2021-06-23 | Disposition: A | Discharge status: Home / Self Care | Payer: Medicare Other | Source: Ambulatory Visit | Attending: Radiation Oncology | Admitting: Radiation Oncology

## 2021-06-23 DIAGNOSIS — Z510 Encounter for antineoplastic radiation therapy: Secondary | ICD-10-CM | POA: Diagnosis not present

## 2021-06-24 ENCOUNTER — Ambulatory Visit
Admission: RE | Admit: 2021-06-24 | Discharge: 2021-06-24 | Disposition: A | Discharge status: Home / Self Care | Payer: Medicare Other | Source: Ambulatory Visit | Attending: Radiation Oncology | Admitting: Radiation Oncology

## 2021-06-24 DIAGNOSIS — Z510 Encounter for antineoplastic radiation therapy: Secondary | ICD-10-CM | POA: Diagnosis not present

## 2021-06-25 ENCOUNTER — Ambulatory Visit
Admission: RE | Admit: 2021-06-25 | Discharge: 2021-06-25 | Disposition: A | Discharge status: Home / Self Care | Payer: Medicare Other | Source: Ambulatory Visit | Attending: Radiation Oncology | Admitting: Radiation Oncology

## 2021-06-25 DIAGNOSIS — Z510 Encounter for antineoplastic radiation therapy: Secondary | ICD-10-CM | POA: Diagnosis not present

## 2021-06-28 ENCOUNTER — Ambulatory Visit
Admission: RE | Admit: 2021-06-28 | Discharge: 2021-06-28 | Disposition: A | Discharge status: Home / Self Care | Payer: Medicare Other | Source: Ambulatory Visit | Attending: Radiation Oncology | Admitting: Radiation Oncology

## 2021-06-28 DIAGNOSIS — Z510 Encounter for antineoplastic radiation therapy: Secondary | ICD-10-CM | POA: Diagnosis not present

## 2021-06-29 ENCOUNTER — Ambulatory Visit
Admission: RE | Admit: 2021-06-29 | Discharge: 2021-06-29 | Disposition: A | Discharge status: Home / Self Care | Payer: Medicare Other | Source: Ambulatory Visit | Attending: Radiation Oncology | Admitting: Radiation Oncology

## 2021-06-29 ENCOUNTER — Other Ambulatory Visit: Payer: Self-pay

## 2021-06-29 DIAGNOSIS — Z510 Encounter for antineoplastic radiation therapy: Secondary | ICD-10-CM | POA: Diagnosis not present

## 2021-06-29 LAB — RAD ONC ARIA SESSION SUMMARY
Course Elapsed Days: 22
Plan Fractions Treated to Date: 17
Plan Prescribed Dose Per Fraction: 2 Gy
Plan Total Fractions Prescribed: 33
Plan Total Prescribed Dose: 66 Gy
Reference Point Dosage Given to Date: 34 Gy
Reference Point Session Dosage Given: 2 Gy
Session Number: 17

## 2021-06-30 ENCOUNTER — Other Ambulatory Visit: Payer: Self-pay

## 2021-06-30 ENCOUNTER — Ambulatory Visit
Admission: RE | Admit: 2021-06-30 | Discharge: 2021-06-30 | Disposition: A | Discharge status: Home / Self Care | Payer: Medicare Other | Source: Ambulatory Visit | Attending: Radiation Oncology | Admitting: Radiation Oncology

## 2021-06-30 DIAGNOSIS — Z510 Encounter for antineoplastic radiation therapy: Secondary | ICD-10-CM | POA: Diagnosis not present

## 2021-06-30 LAB — RAD ONC ARIA SESSION SUMMARY
Course Elapsed Days: 23
Plan Fractions Treated to Date: 18
Plan Prescribed Dose Per Fraction: 2 Gy
Plan Total Fractions Prescribed: 33
Plan Total Prescribed Dose: 66 Gy
Reference Point Dosage Given to Date: 36 Gy
Reference Point Session Dosage Given: 2 Gy
Session Number: 18

## 2021-07-01 ENCOUNTER — Other Ambulatory Visit: Payer: Self-pay

## 2021-07-01 ENCOUNTER — Ambulatory Visit
Admission: RE | Admit: 2021-07-01 | Discharge: 2021-07-01 | Disposition: A | Discharge status: Home / Self Care | Payer: Medicare Other | Source: Ambulatory Visit | Attending: Radiation Oncology | Admitting: Radiation Oncology

## 2021-07-01 DIAGNOSIS — Z510 Encounter for antineoplastic radiation therapy: Secondary | ICD-10-CM | POA: Diagnosis not present

## 2021-07-01 LAB — RAD ONC ARIA SESSION SUMMARY
Course Elapsed Days: 24
Plan Fractions Treated to Date: 19
Plan Prescribed Dose Per Fraction: 2 Gy
Plan Total Fractions Prescribed: 33
Plan Total Prescribed Dose: 66 Gy
Reference Point Dosage Given to Date: 38 Gy
Reference Point Session Dosage Given: 2 Gy
Session Number: 19

## 2021-07-02 ENCOUNTER — Other Ambulatory Visit: Payer: Self-pay

## 2021-07-02 ENCOUNTER — Ambulatory Visit
Admission: RE | Admit: 2021-07-02 | Discharge: 2021-07-02 | Disposition: A | Discharge status: Home / Self Care | Payer: Medicare Other | Source: Ambulatory Visit | Attending: Radiation Oncology | Admitting: Radiation Oncology

## 2021-07-02 DIAGNOSIS — Z510 Encounter for antineoplastic radiation therapy: Secondary | ICD-10-CM | POA: Diagnosis not present

## 2021-07-02 LAB — RAD ONC ARIA SESSION SUMMARY
Course Elapsed Days: 25
Plan Fractions Treated to Date: 20
Plan Prescribed Dose Per Fraction: 2 Gy
Plan Total Fractions Prescribed: 33
Plan Total Prescribed Dose: 66 Gy
Reference Point Dosage Given to Date: 40 Gy
Reference Point Session Dosage Given: 2 Gy
Session Number: 20

## 2021-07-03 ENCOUNTER — Ambulatory Visit
Admission: EM | Admit: 2021-07-03 | Discharge: 2021-07-03 | Disposition: A | Discharge status: Home / Self Care | Payer: Medicare Other | Attending: Emergency Medicine | Admitting: Emergency Medicine

## 2021-07-03 DIAGNOSIS — N3000 Acute cystitis without hematuria: Secondary | ICD-10-CM | POA: Insufficient documentation

## 2021-07-03 LAB — URINALYSIS, MICROSCOPIC (REFLEX): WBC, UA: >50 WBC/hpf (ref 0–5)

## 2021-07-03 LAB — URINALYSIS, ROUTINE W REFLEX MICROSCOPIC
Bilirubin Urine: NEGATIVE
Glucose, UA: NEGATIVE mg/dL
Ketones, ur: NEGATIVE mg/dL
Nitrite: NEGATIVE
Specific Gravity, Urine: 1.01 (ref 1.005–1.030)
pH: 6 (ref 5.0–8.0)

## 2021-07-03 MED ORDER — CEFTRIAXONE SODIUM 1 G IJ SOLR
1.0000 g | Freq: Once | INTRAMUSCULAR | Status: AC
  Administered 2021-07-03: 1 g via INTRAMUSCULAR

## 2021-07-03 MED ORDER — CEPHALEXIN 500 MG PO CAPS: 500.0000 mg | ORAL_CAPSULE | Freq: Four times a day (QID) | ORAL | 0 refills | Status: DC

## 2021-07-03 NOTE — ED Triage Notes (Signed)
Patient is here for Dysuria. Symptoms started a few days ago. Some urinary frequency and urgency as well. Very dark in color. "Maybe a fever". Abd pain "and swelling also".  ?

## 2021-07-03 NOTE — ED Provider Notes (Signed)
? ?[facility] ?Provider Note ? ?Patient Contact: 9:44 AM (approximate) ? ? ?History  ? ?UTI Symptoms ? ? ?HPI ? ?Jennifer Hodges is a 76 y.o. female who presents to the urgent care complaining of dysuria, abdominal pain, abdominal bloating and back pain.  Patient states symptoms have been ongoing for the past week.  No reported fevers or chills.  Patient is currently receiving both chemo and radiation for lung cancer.  Patient states that she has felt more weak than normal.  She has problems with her balance at baseline and routinely feels weak and falls that she has noticed increased symptoms this past week.  Patient believes that she has a UTI given her urinary frequency and dysuria. ?  ? ? ?Physical Exam  ? ?Triage Vital Signs: ?ED Triage Vitals  ?Enc Vitals Group  ?   BP 07/03/21 0919 118/66  ?   Pulse Rate 07/03/21 0919 (!) 104  ?   Resp 07/03/21 0919 18  ?   Temp 07/03/21 0919 98.7 ?F (37.1 ?C)  ?   Temp Source 07/03/21 0919 Oral  ?   SpO2 --   ?   Weight 07/03/21 0916 160 lb (72.6 kg)  ?   Height 07/03/21 0916 5\' 1"  (1.549 m)  ?   Head Circumference --   ?   Peak Flow --   ?   Pain Score 07/03/21 0913 8  ?   Pain Loc --   ?   Pain Edu? --   ?   Excl. in [place]? --   ? ? ?Most recent vital signs: ?Vitals:  ? 07/03/21 0919  ?BP: 118/66  ?Pulse: (!) 104  ?Resp: 18  ?Temp: 98.7 ?F (37.1 ?C)  ? ? ? ?General: Alert and in no acute distress.  ?Cardiovascular:  Good peripheral perfusion ?Respiratory: Normal respiratory effort without tachypnea or retractions. Lungs CTAB. Good air entry to the bases with no decreased or absent breath sounds. ?Gastrointestinal: Bowel sounds ?4 quadrants. Soft and nontender to palpation. No guarding or rigidity. No palpable masses. No distention. No CVA tenderness ?Musculoskeletal: Full range of motion to all extremities.  ?Neurologic:  No gross focal neurologic deficits are appreciated.  ?Skin:   No rash noted ?Other: ? ? ?ED Results / Procedures / Treatments  ? ?Labs ?(all labs  ordered are listed, but only abnormal results are displayed) ?Labs Reviewed  ?URINALYSIS, ROUTINE W REFLEX MICROSCOPIC - Abnormal; Notable for the following components:  ?    Result Value  ? APPearance CLOUDY (*)   ? Hgb urine dipstick MODERATE (*)   ? Protein, ur TRACE (*)   ? Leukocytes,Ua LARGE (*)   ? All other components within normal limits  ?URINALYSIS, MICROSCOPIC (REFLEX) - Abnormal; Notable for the following components:  ? Bacteria, UA MANY (*)   ? All other components within normal limits  ?URINE CULTURE  ? ? ? ?EKG ? ? ? ? ?RADIOLOGY ? ? ? ? ?No results found. ? ?PROCEDURES: ? ?Critical Care performed: No ? ?Procedures ? ? ?MEDICATIONS ORDERED IN ED: ?Medications  ?cefTRIAXone (ROCEPHIN) injection 1 g (has no administration in time range)  ? ? ? ?IMPRESSION / MDM / ASSESSMENT AND PLAN / ED COURSE  ?I reviewed the triage vital signs and the nursing notes. ?             ?               ? ?Differential diagnosis includes, but is not limited to,  UTI, intra-abdominal infection, nephrolithiasis, colitis, interstitial cystitis ? ? ?Patient's diagnosis is consistent with UTI.  Patient presents to the urgent care with complaint of dysuria, polyuria, abdominal pain, weakness.  Patient is currently receiving chemo and radiation for lung cancer.  Given the symptoms, I advised the patient that we were able to perform a urinalysis in this setting but I was unable to perform advanced imaging or further labs at this time.  Patient's urinalysis does reveal evidence of infection.  We will treat with Rocephin and Keflex at home.  If patient has any worsening symptoms to include worsening abdominal pain, weakness, nausea, vomiting, inability to keep medications down fevers she should be seen in the emergency department.  Patient is agreeable with this plan.  Follow-up with primary care as needed.. Patient is given ED precautions to return to the ED for any worsening or new symptoms. ? ? ? ?  ? ? ?FINAL CLINICAL  IMPRESSION(S) / ED DIAGNOSES  ? ?Final diagnoses:  ?Acute cystitis without hematuria  ? ? ? ?Rx / DC Orders  ? ?ED Discharge Orders   ? ?      Ordered  ?  cephALEXin (KEFLEX) 500 MG capsule  4 times daily       ? 07/03/21 1034  ? ?  ?  ? ?  ? ? ? ?Note:  This document was prepared using Dragon voice recognition software and may include unintentional dictation errors. ?  ?Darletta Moll, PA-C ?07/03/21 1036 ? ?

## 2021-07-05 ENCOUNTER — Other Ambulatory Visit: Payer: Self-pay

## 2021-07-05 ENCOUNTER — Ambulatory Visit
Admission: RE | Admit: 2021-07-05 | Discharge: 2021-07-05 | Disposition: A | Discharge status: Home / Self Care | Payer: Medicare Other | Source: Ambulatory Visit | Attending: Radiation Oncology | Admitting: Radiation Oncology

## 2021-07-05 DIAGNOSIS — Z510 Encounter for antineoplastic radiation therapy: Secondary | ICD-10-CM | POA: Diagnosis not present

## 2021-07-05 LAB — RAD ONC ARIA SESSION SUMMARY
Course Elapsed Days: 28
Plan Fractions Treated to Date: 21
Plan Prescribed Dose Per Fraction: 2 Gy
Plan Total Fractions Prescribed: 33
Plan Total Prescribed Dose: 66 Gy
Reference Point Dosage Given to Date: 42 Gy
Reference Point Session Dosage Given: 2 Gy
Session Number: 21

## 2021-07-06 ENCOUNTER — Ambulatory Visit
Admission: RE | Admit: 2021-07-06 | Discharge: 2021-07-06 | Disposition: A | Discharge status: Home / Self Care | Payer: Medicare Other | Source: Ambulatory Visit | Attending: Radiation Oncology | Admitting: Radiation Oncology

## 2021-07-06 ENCOUNTER — Other Ambulatory Visit: Payer: Self-pay

## 2021-07-06 DIAGNOSIS — Z510 Encounter for antineoplastic radiation therapy: Secondary | ICD-10-CM | POA: Diagnosis not present

## 2021-07-06 LAB — URINE CULTURE: Culture: 60000 — AB

## 2021-07-06 LAB — RAD ONC ARIA SESSION SUMMARY
Course Elapsed Days: 29
Plan Fractions Treated to Date: 22
Plan Prescribed Dose Per Fraction: 2 Gy
Plan Total Fractions Prescribed: 33
Plan Total Prescribed Dose: 66 Gy
Reference Point Dosage Given to Date: 44 Gy
Reference Point Session Dosage Given: 2 Gy
Session Number: 22

## 2021-07-07 ENCOUNTER — Inpatient Hospital Stay (HOSPITAL_BASED_OUTPATIENT_CLINIC_OR_DEPARTMENT_OTHER): Payer: Medicare Other | Admitting: Oncology

## 2021-07-07 ENCOUNTER — Inpatient Hospital Stay: Payer: Medicare Other

## 2021-07-07 ENCOUNTER — Telehealth: Payer: Self-pay | Admitting: *Deleted

## 2021-07-07 ENCOUNTER — Ambulatory Visit
Admission: RE | Admit: 2021-07-07 | Discharge: 2021-07-07 | Disposition: A | Discharge status: Home / Self Care | Payer: Medicare Other | Source: Ambulatory Visit | Attending: Radiation Oncology | Admitting: Radiation Oncology

## 2021-07-07 ENCOUNTER — Other Ambulatory Visit: Payer: Self-pay

## 2021-07-07 ENCOUNTER — Encounter: Payer: Self-pay | Admitting: Oncology

## 2021-07-07 VITALS — BP 113/76 | HR 103 | Temp 97.3°F | Resp 18 | Wt 162.0 lb

## 2021-07-07 DIAGNOSIS — C3490 Malignant neoplasm of unspecified part of unspecified bronchus or lung: Secondary | ICD-10-CM | POA: Diagnosis not present

## 2021-07-07 DIAGNOSIS — Z5111 Encounter for antineoplastic chemotherapy: Secondary | ICD-10-CM

## 2021-07-07 DIAGNOSIS — T451X5A Adverse effect of antineoplastic and immunosuppressive drugs, initial encounter: Secondary | ICD-10-CM | POA: Diagnosis not present

## 2021-07-07 DIAGNOSIS — D701 Agranulocytosis secondary to cancer chemotherapy: Secondary | ICD-10-CM

## 2021-07-07 DIAGNOSIS — Z510 Encounter for antineoplastic radiation therapy: Secondary | ICD-10-CM | POA: Diagnosis not present

## 2021-07-07 DIAGNOSIS — C3412 Malignant neoplasm of upper lobe, left bronchus or lung: Secondary | ICD-10-CM

## 2021-07-07 LAB — RAD ONC ARIA SESSION SUMMARY
Course Elapsed Days: 30
Plan Fractions Treated to Date: 23
Plan Prescribed Dose Per Fraction: 2 Gy
Plan Total Fractions Prescribed: 33
Plan Total Prescribed Dose: 66 Gy
Reference Point Dosage Given to Date: 46 Gy
Reference Point Session Dosage Given: 2 Gy
Session Number: 23

## 2021-07-07 LAB — CBC WITH DIFFERENTIAL/PLATELET
Abs Immature Granulocytes: 0.01 10*3/uL (ref 0.00–0.07)
Basophils Absolute: 0 10*3/uL (ref 0.0–0.1)
Basophils Relative: 1 %
Eosinophils Absolute: 0.1 10*3/uL (ref 0.0–0.5)
Eosinophils Relative: 4 %
HCT: 33.5 % — ABNORMAL LOW (ref 36.0–46.0)
Hemoglobin: 11.2 g/dL — ABNORMAL LOW (ref 12.0–15.0)
Immature Granulocytes: 1 %
Lymphocytes Relative: 19 %
Lymphs Abs: 0.4 10*3/uL — ABNORMAL LOW (ref 0.7–4.0)
MCH: 31.2 pg (ref 26.0–34.0)
MCHC: 33.4 g/dL (ref 30.0–36.0)
MCV: 93.3 fL (ref 80.0–100.0)
Monocytes Absolute: 0.4 10*3/uL (ref 0.1–1.0)
Monocytes Relative: 20 %
Neutro Abs: 1.1 10*3/uL — ABNORMAL LOW (ref 1.7–7.7)
Neutrophils Relative %: 55 %
Platelets: 215 10*3/uL (ref 150–400)
RBC: 3.59 MIL/uL — ABNORMAL LOW (ref 3.87–5.11)
RDW: 14.4 % (ref 11.5–15.5)
WBC: 1.9 10*3/uL — ABNORMAL LOW (ref 4.0–10.5)
nRBC: 0 % (ref 0.0–0.2)

## 2021-07-07 LAB — COMPREHENSIVE METABOLIC PANEL
ALT: 19 U/L (ref 0–44)
AST: 23 U/L (ref 15–41)
Albumin: 3.8 g/dL (ref 3.5–5.0)
Alkaline Phosphatase: 73 U/L (ref 38–126)
Anion gap: 8 (ref 5–15)
BUN: 7 mg/dL — ABNORMAL LOW (ref 8–23)
CO2: 28 mmol/L (ref 22–32)
Calcium: 9 mg/dL (ref 8.9–10.3)
Chloride: 101 mmol/L (ref 98–111)
Creatinine, Ser: 0.37 mg/dL — ABNORMAL LOW (ref 0.44–1.00)
GFR, Estimated: >60 mL/min (ref >60)
Glucose, Bld: 88 mg/dL (ref 70–99)
Potassium: 4 mmol/L (ref 3.5–5.1)
Sodium: 137 mmol/L (ref 135–145)
Total Bilirubin: 0.3 mg/dL (ref 0.3–1.2)
Total Protein: 6.3 g/dL — ABNORMAL LOW (ref 6.5–8.1)

## 2021-07-07 MED ORDER — SODIUM CHLORIDE 0.9 % IV SOLN
500.0000 mg/m2 | Freq: Once | INTRAVENOUS | Status: AC
  Administered 2021-07-07: 900 mg via INTRAVENOUS
  Filled 2021-07-07: qty 20

## 2021-07-07 MED ORDER — CYANOCOBALAMIN 1000 MCG/ML IJ SOLN
1000.0000 ug | Freq: Once | INTRAMUSCULAR | Status: AC
  Administered 2021-07-07: 1000 ug via INTRAMUSCULAR
  Filled 2021-07-07: qty 1

## 2021-07-07 MED ORDER — SODIUM CHLORIDE 0.9 % IV SOLN: 390.0000 mg | Freq: Once | INTRAVENOUS | Status: DC

## 2021-07-07 MED ORDER — FLUCONAZOLE 200 MG PO TABS: 200.0000 mg | ORAL_TABLET | Freq: Once | ORAL | 0 refills | Status: AC

## 2021-07-07 MED ORDER — PALONOSETRON HCL INJECTION 0.25 MG/5ML
0.2500 mg | Freq: Once | INTRAVENOUS | Status: AC
  Administered 2021-07-07: 0.25 mg via INTRAVENOUS
  Filled 2021-07-07: qty 5

## 2021-07-07 MED ORDER — SODIUM CHLORIDE 0.9 % IV SOLN
150.0000 mg | Freq: Once | INTRAVENOUS | Status: AC
  Administered 2021-07-07: 150 mg via INTRAVENOUS
  Filled 2021-07-07: qty 5
  Filled 2021-07-07: qty 150

## 2021-07-07 MED ORDER — SODIUM CHLORIDE 0.9 % IV SOLN
10.0000 mg | Freq: Once | INTRAVENOUS | Status: AC
  Administered 2021-07-07: 10 mg via INTRAVENOUS
  Filled 2021-07-07: qty 1
  Filled 2021-07-07: qty 10

## 2021-07-07 MED ORDER — HEPARIN SOD (PORK) LOCK FLUSH 100 UNIT/ML IV SOLN
500.0000 [IU] | Freq: Once | INTRAVENOUS | Status: AC | PRN
  Administered 2021-07-07: 500 [IU]
  Filled 2021-07-07: qty 5

## 2021-07-07 MED ORDER — SODIUM CHLORIDE 0.9 % IV SOLN
Freq: Once | INTRAVENOUS | Status: AC
  Filled 2021-07-07: qty 250

## 2021-07-07 MED ORDER — SODIUM CHLORIDE 0.9 % IV SOLN
390.0000 mg | Freq: Once | INTRAVENOUS | Status: AC
  Administered 2021-07-07: 390 mg via INTRAVENOUS
  Filled 2021-07-07: qty 39

## 2021-07-07 NOTE — Telephone Encounter (Signed)
Patient called stating that she forget to ask for antibiotics for vaginal burning and itching when she was in this morning and also that since leaving office, she feels as if she has a UTI. Please advise ?

## 2021-07-07 NOTE — Telephone Encounter (Signed)
Per Dr. Rao---sent in one dose of DIFLUCAN 200mg  to [facility] in [facility] and pt is aware.  ?

## 2021-07-07 NOTE — Progress Notes (Signed)
Per MD ok to proceed with chemo with HR 103 and ANC 1.1 ?

## 2021-07-07 NOTE — Patient Instructions (Signed)
[facility] CANCER CTR AT [place]  Discharge Instructions: ?Thank you for choosing [facility] to provide your oncology and hematology care.  ?If you have a lab appointment with the [facility], please go directly to the [facility] and check in at the registration area. ? ?Wear comfortable clothing and clothing appropriate for easy access to any Portacath or PICC line.  ? ?We strive to give you quality time with your provider. You may need to reschedule your appointment if you arrive late (15 or more minutes).  Arriving late affects you and other patients whose appointments are after yours.  Also, if you miss three or more appointments without notifying the office, you may be dismissed from the clinic at the provider?s discretion.    ?  ?For prescription refill requests, have your pharmacy contact our office and allow 72 hours for refills to be completed.   ? ?Today you received the following chemotherapy and/or immunotherapy agents: carbo/PEMEtrexed ?  ?To help prevent nausea and vomiting after your treatment, we encourage you to take your nausea medication as directed. ? ?BELOW ARE SYMPTOMS THAT SHOULD BE REPORTED IMMEDIATELY: ?*FEVER GREATER THAN 100.4 F (38 ?C) OR HIGHER ?*CHILLS OR SWEATING ?*NAUSEA AND VOMITING THAT IS NOT CONTROLLED WITH YOUR NAUSEA MEDICATION ?*UNUSUAL SHORTNESS OF BREATH ?*UNUSUAL BRUISING OR BLEEDING ?*URINARY PROBLEMS (pain or burning when urinating, or frequent urination) ?*BOWEL PROBLEMS (unusual diarrhea, constipation, pain near the anus) ?TENDERNESS IN MOUTH AND THROAT WITH OR WITHOUT PRESENCE OF ULCERS (sore throat, sores in mouth, or a toothache) ?UNUSUAL RASH, SWELLING OR PAIN  ?UNUSUAL VAGINAL DISCHARGE OR ITCHING  ? ?Items with * indicate a potential emergency and should be followed up as soon as possible or go to the Emergency Department if any problems should occur. ? ?Please show the CHEMOTHERAPY ALERT CARD or IMMUNOTHERAPY ALERT CARD at check-in  to the Emergency Department and triage nurse. ? ?Should you have questions after your visit or need to cancel or reschedule your appointment, please contact [facility] CANCER [place] AT [place]  [phone] and follow the prompts.  Office hours are 8:00 a.m. to 4:30 p.m. Monday - Friday. Please note that voicemails left after 4:00 p.m. may not be returned until the following business day.  We are closed weekends and major holidays. You have access to a nurse at all times for urgent questions. Please call the main number to the clinic [phone] and follow the prompts. ? ?For any non-urgent questions, you may also contact your provider using MyChart. We now offer e-Visits for anyone 84 and older to request care online for non-urgent symptoms. For details visit [email or web address]. ?  ?Also download the MyChart app! Go to the app store, search "MyChart", open the app, select [facility], and log in with your MyChart username and password. ? ?Due to Covid, a mask is required upon entering the hospital/clinic. If you do not have a mask, one will be given to you upon arrival. For doctor visits, patients may have 1 support person aged 76 or older with them. For treatment visits, patients cannot have anyone with them due to current Covid guidelines and our immunocompromised population.  ?

## 2021-07-07 NOTE — Progress Notes (Signed)
Patient tolerated carbo/ PEMEtrexed infusion well, no questions/concerns voiced. Patient stable at discharge. AVS given.    ?

## 2021-07-07 NOTE — Progress Notes (Signed)
? ? ? ?Hematology/Oncology Consult note ?[facility] [place]  ?[phone] [phone] ? ?Patient Care Team: ?[name], MD as PCP - General (Family Medicine) ?Telford Nab, RN as Equities trader ?Noreene Filbert, MD as Referring Physician (Radiation Oncology) ?Sindy Guadeloupe, MD as Consulting Physician (Oncology)  ? ?Name of the patient: Jennifer Hodges  ?[id number]  ?June 16, 1945  ? ?Date of visit: 07/07/21 ? 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 ?Chief complaint/ Reason for visit-on treatment assessment prior to cycle 3 of carbo Alimta chemotherapy ? ?Heme/Onc history: Patient is a 76 year old female diagnosed with T1b N2 M0 stage III adenocarcinoma of the lung in September 2020.  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  EGFR and Ros as well as MET testing negative. PD-L1 was 50% ?  ?Patient completed concurrent chemoradiation with weekly carbotaxol chemotherapy on 03/12/2019.  Maintenance durvalumab started in January 2021 after scan showed partial response.  Patient completed 1 year of maintenance durvalumab in February 2022 ?  ?Patient was found to have enlarging mediastinal adenopathyOn subsequent CT scan in January 2023.  This was followed by a PET scan which showed hypermetabolic mediastinal and right hilar lymph nodes but no other evidence of disease elsewhere.  This was biopsied and was consistent with non-small cell lung cancer specifically adenocarcinoma.  Cells were positive for TTF-1 and Napsin A and negative for p40. ?  ?Plan is to proceed with concurrent chemoradiation with carboplatin and Alimta with consideration for maintenance Alimta ?  ? ?Interval history-reports that she was diagnosed with UTI last week and is currently on Keflex.  She also has chronic nonproductive cough.  After patient left my clinic  she sent a message saying that she is having some vaginal burning and itching. ? ?ECOG PS- 1 ?Pain scale- 0 ? ? ?Review of systems- Review of Systems  ?Constitutional:  Negative for chills, fever, malaise/fatigue and weight loss.  ?HENT:  Negative for congestion, ear discharge and nosebleeds.   ?Eyes:  Negative for blurred vision.  ?Respiratory:  Positive for cough. Negative for hemoptysis, sputum production, shortness of breath and wheezing.   ?Cardiovascular:  Negative for chest pain, palpitations, orthopnea and claudication.  ?Gastrointestinal:  Negative for abdominal pain, blood in stool, constipation, diarrhea, heartburn, melena, nausea and vomiting.  ?Genitourinary:  Positive for dysuria. Negative for flank pain, frequency, hematuria and urgency.  ?Musculoskeletal:  Negative for back pain, joint pain and myalgias.  ?Skin:  Negative for rash.  ?Neurological:  Negative for dizziness, tingling, focal weakness, seizures, weakness and headaches.  ?Endo/Heme/Allergies:  Does not bruise/bleed easily.  ?Psychiatric/Behavioral:  Negative for depression and suicidal ideas. The patient does not have insomnia.    ? ? ?Allergies  ?Allergen Reactions  ? Aspirin Hives, Shortness Of Breath and Other (See Comments)  ?  Difficulty breathing  ? Celebrex [Celecoxib] Shortness Of Breath  ? Morphine And Related Nausea And Vomiting and Swelling  ? Adhesive [Tape] Other (See Comments)  ?  Took top layer of skin off when removed.  ? Clarithromycin Nausea And Vomiting  ? Codeine Nausea And Vomiting  ? Darvon [Propoxyphene Hcl] Nausea And Vomiting  ? Demerol [Meperidine] Nausea And Vomiting  ? Flonase [Fluticasone Propionate] Other (See Comments)  ?  Fungal infection in nose  ? Simvastatin Other (See Comments)  ?  "caused ulcers in mouth, and fever"  ? Talwin [Pentazocine] Nausea And  Vomiting  ? ? ? ?Past Medical History:  ?Diagnosis Date  ? Anemia   ? Asthma   ? No Inhalers--Dr. [name] will order as needed  ? Bronchiectasis ([place])   ?  mild  ? Chronic headaches   ?  followed by [facility] migraines  ? COPD (chronic obstructive pulmonary disease) ([place])   ? DDD (degenerative disc disease), lumbar   ? Diverticulosis   ? Family history of adverse reaction to anesthesia   ? mother and sisters-PONV  ? Gall stones   ? history of  ? GERD (gastroesophageal reflux disease)   ? EGD 8/09- non bleeding erosive gastritis, documentd esophageal ulcerations.   ? Hiatal hernia   ? small  ? History of kidney stones   ? History of pneumonia   ? Hypercholesterolemia   ? IBS (irritable bowel syndrome)   ? Malignant neoplasm of upper lobe of left lung ([place]) 12/27/2018  ? Meniere disease   ? Murmur   ? Osteoarthritis   ? lumbar disc disease, left hip  ? Personal history of chemotherapy   ? Personal history of radiation therapy   ? Pneumonia 12/2020  ? PONV (postoperative nausea and vomiting)   ? Sleep apnea   ? uses cpap  ? Vertigo   ? Weakness of right side of body   ? ? ? ?Past Surgical History:  ?Procedure Laterality Date  ? ABDOMINAL HYSTERECTOMY  age 20  ? ANTERIOR CERVICAL DECOMP/DISCECTOMY FUSION N/A 02/24/2015  ? Procedure: CERVICAL FOUR-FIVE, CERVICAL FIVE-SIX, CERVICAL SIX-SEVEN ANTERIOR CERVICAL DECOMPRESSION/DISCECTOMY FUSION ;  Surgeon: [name], MD;  Location: [facility];  Service: Neurosurgery;  Laterality: N/A;  C45 C56 C67 anterior cervical decompression with fusion interbody prosthesis plating and bonegraft  ? APPENDECTOMY    ? BACK SURGERY  1988  ? 4th lumbar fusion  ? BLADDER SURGERY N/A   ? with vaginal wall repair  ? BREAST CYST ASPIRATION Bilateral   ? neg  ? BREAST SURGERY Bilateral   ? cyst removed and reduction  ? CARDIAC CATHETERIZATION  2014  ? CARPAL TUNNEL RELEASE Right 02/11/2016  ? Procedure: CARPAL TUNNEL RELEASE;  Surgeon: [name], MD;  Location: [facility];  Service: Orthopedics;  Laterality: Right;  ? CATARACT EXTRACTION W/ INTRAOCULAR LENS IMPLANT Bilateral 2015  ? CHOLECYSTECTOMY    ? EUS N/A 05/31/2012  ?  Procedure: UPPER ENDOSCOPIC ULTRASOUND (EUS) LINEAR;  Surgeon: [name], MD;  Location: [facility];  Service: Endoscopy;  Laterality: N/A;  ? EXCISIONAL HEMORRHOIDECTOMY    ? JOINT REPLACEMENT Bilateral   ? KNEE ARTHROSCOPY WITH LATERAL MENISECTOMY Right 07/07/2015  ? Procedure: KNEE ARTHROSCOPY WITH LATERAL MENISECTOMY, PARTIAL SYNOVECTOMY;  Surgeon: [name], MD;  Location: [facility];  Service: Orthopedics;  Laterality: Right;  ? LUMBAR LAMINECTOMY    ? PORTA CATH INSERTION N/A 01/07/2019  ? Procedure: PORTA CATH INSERTION;  Surgeon: [name], MD;  Location: [facility];  Service: Cardiovascular;  Laterality: N/A;  ? REDUCTION MAMMAPLASTY  1990  ? RIGHT OOPHORECTOMY    ? TOTAL HIP ARTHROPLASTY Left 05/01/2014  ? Dr. [name]  ? TOTAL HIP ARTHROPLASTY Right 08/05/2014  ? Procedure: TOTAL HIP ARTHROPLASTY ANTERIOR APPROACH;  Surgeon: [name], MD;  Location: [facility];  Service: Orthopedics;  Laterality: Right;  ? ULNAR NERVE TRANSPOSITION Right 02/11/2016  ? Procedure: ULNAR NERVE DECOMPRESSION/TRANSPOSITION;  Surgeon: [name], MD;  Location: [facility];  Service: Orthopedics;  Laterality: Right;  ? VIDEO BRONCHOSCOPY WITH ENDOBRONCHIAL ULTRASOUND Left 12/19/2018  ?  Procedure: VIDEO BRONCHOSCOPY WITH ENDOBRONCHIAL ULTRASOUND, LEFT, SLEEP APNEA;  Surgeon: [name], MD;  Location: [facility];  Service: Thoracic;  Laterality: Left;  ? VIDEO BRONCHOSCOPY WITH ENDOBRONCHIAL ULTRASOUND Right 05/14/2021  ? Procedure: VIDEO BRONCHOSCOPY WITH ENDOBRONCHIAL ULTRASOUND;  Surgeon: Tyler Pita, MD;  Location: [facility];  Service: Pulmonary;  Laterality: Right;  ? ? ?Social History  ? ?Socioeconomic History  ? Marital status: Married  ?  Spouse name: Not on file  ? Number of children: Not on file  ? Years of education: Not on file  ? Highest education level: Not on file  ?Occupational History  ? Not on file  ?Tobacco Use  ? Smoking status: Former  ?  Packs/day: 1.00  ?  Years: 40.00   ?  Pack years: 40.00  ?  Types: Cigarettes  ?  Quit date: 06/13/2007  ?  Years since quitting: 14.0  ? Smokeless tobacco: Never  ?Vaping Use  ? Vaping Use: Never used  ?Substance and Sexual Activity  ? Alcohol [place]

## 2021-07-07 NOTE — Progress Notes (Signed)
Pt in for follow up, reports UTI and seen at urgent care in [place]. Give injection and started on keflex 500mg  four times a day.  ?

## 2021-07-07 NOTE — Telephone Encounter (Signed)
Her urine was checked last week by rad onc. Did they give abx? If not send for bactrim

## 2021-07-08 ENCOUNTER — Other Ambulatory Visit: Payer: Self-pay

## 2021-07-08 ENCOUNTER — Inpatient Hospital Stay: Payer: Medicare Other

## 2021-07-08 ENCOUNTER — Ambulatory Visit
Admission: RE | Admit: 2021-07-08 | Discharge: 2021-07-08 | Disposition: A | Discharge status: Home / Self Care | Payer: Medicare Other | Source: Ambulatory Visit | Attending: Radiation Oncology | Admitting: Radiation Oncology

## 2021-07-08 DIAGNOSIS — D649 Anemia, unspecified: Secondary | ICD-10-CM

## 2021-07-08 DIAGNOSIS — Z510 Encounter for antineoplastic radiation therapy: Secondary | ICD-10-CM | POA: Diagnosis not present

## 2021-07-08 DIAGNOSIS — C3412 Malignant neoplasm of upper lobe, left bronchus or lung: Secondary | ICD-10-CM

## 2021-07-08 LAB — RAD ONC ARIA SESSION SUMMARY
Course Elapsed Days: 31
Plan Fractions Treated to Date: 24
Plan Prescribed Dose Per Fraction: 2 Gy
Plan Total Fractions Prescribed: 33
Plan Total Prescribed Dose: 66 Gy
Reference Point Dosage Given to Date: 48 Gy
Reference Point Session Dosage Given: 2 Gy
Session Number: 24

## 2021-07-08 MED ORDER — FILGRASTIM-AAFI 480 MCG/0.8ML IJ SOSY
480.0000 ug | PREFILLED_SYRINGE | Freq: Every day | INTRAMUSCULAR | Status: DC
  Administered 2021-07-08: 480 ug via SUBCUTANEOUS
  Filled 2021-07-08: qty 0.8

## 2021-07-09 ENCOUNTER — Ambulatory Visit
Admission: RE | Admit: 2021-07-09 | Discharge: 2021-07-09 | Disposition: A | Discharge status: Home / Self Care | Payer: Medicare Other | Source: Ambulatory Visit | Attending: Radiation Oncology | Admitting: Radiation Oncology

## 2021-07-09 ENCOUNTER — Inpatient Hospital Stay: Payer: Medicare Other

## 2021-07-09 ENCOUNTER — Other Ambulatory Visit: Payer: Self-pay

## 2021-07-09 DIAGNOSIS — C3412 Malignant neoplasm of upper lobe, left bronchus or lung: Secondary | ICD-10-CM

## 2021-07-09 DIAGNOSIS — Z510 Encounter for antineoplastic radiation therapy: Secondary | ICD-10-CM | POA: Diagnosis not present

## 2021-07-09 DIAGNOSIS — D649 Anemia, unspecified: Secondary | ICD-10-CM

## 2021-07-09 LAB — RAD ONC ARIA SESSION SUMMARY
Course Elapsed Days: 32
Plan Fractions Treated to Date: 25
Plan Prescribed Dose Per Fraction: 2 Gy
Plan Total Fractions Prescribed: 33
Plan Total Prescribed Dose: 66 Gy
Reference Point Dosage Given to Date: 50 Gy
Reference Point Session Dosage Given: 2 Gy
Session Number: 25

## 2021-07-09 MED ORDER — FILGRASTIM-AAFI 480 MCG/0.8ML IJ SOSY
480.0000 ug | PREFILLED_SYRINGE | Freq: Every day | INTRAMUSCULAR | Status: DC
  Administered 2021-07-09: 480 ug via SUBCUTANEOUS
  Filled 2021-07-09: qty 0.8

## 2021-07-12 ENCOUNTER — Ambulatory Visit
Admission: RE | Admit: 2021-07-12 | Discharge: 2021-07-12 | Disposition: A | Discharge status: Home / Self Care | Payer: Medicare Other | Source: Ambulatory Visit | Attending: Radiation Oncology | Admitting: Radiation Oncology

## 2021-07-12 ENCOUNTER — Other Ambulatory Visit: Payer: Self-pay

## 2021-07-12 ENCOUNTER — Inpatient Hospital Stay: Payer: Medicare Other | Attending: Oncology

## 2021-07-12 DIAGNOSIS — C3411 Malignant neoplasm of upper lobe, right bronchus or lung: Secondary | ICD-10-CM | POA: Insufficient documentation

## 2021-07-12 DIAGNOSIS — Z5189 Encounter for other specified aftercare: Secondary | ICD-10-CM | POA: Insufficient documentation

## 2021-07-12 DIAGNOSIS — C3412 Malignant neoplasm of upper lobe, left bronchus or lung: Secondary | ICD-10-CM

## 2021-07-12 DIAGNOSIS — D649 Anemia, unspecified: Secondary | ICD-10-CM

## 2021-07-12 DIAGNOSIS — Z5111 Encounter for antineoplastic chemotherapy: Secondary | ICD-10-CM | POA: Insufficient documentation

## 2021-07-12 DIAGNOSIS — Z510 Encounter for antineoplastic radiation therapy: Secondary | ICD-10-CM | POA: Insufficient documentation

## 2021-07-12 LAB — RAD ONC ARIA SESSION SUMMARY
Course Elapsed Days: 35
Plan Fractions Treated to Date: 26
Plan Prescribed Dose Per Fraction: 2 Gy
Plan Total Fractions Prescribed: 33
Plan Total Prescribed Dose: 66 Gy
Reference Point Dosage Given to Date: 52 Gy
Reference Point Session Dosage Given: 2 Gy
Session Number: 26

## 2021-07-12 MED ORDER — FILGRASTIM-AAFI 480 MCG/0.8ML IJ SOSY
480.0000 ug | PREFILLED_SYRINGE | Freq: Every day | INTRAMUSCULAR | Status: DC
  Administered 2021-07-12: 480 ug via SUBCUTANEOUS
  Filled 2021-07-12: qty 0.8

## 2021-07-13 ENCOUNTER — Inpatient Hospital Stay: Payer: Medicare Other

## 2021-07-13 ENCOUNTER — Ambulatory Visit
Admission: RE | Admit: 2021-07-13 | Discharge: 2021-07-13 | Disposition: A | Discharge status: Home / Self Care | Payer: Medicare Other | Source: Ambulatory Visit | Attending: Radiation Oncology | Admitting: Radiation Oncology

## 2021-07-13 ENCOUNTER — Other Ambulatory Visit: Payer: Self-pay

## 2021-07-13 ENCOUNTER — Telehealth: Payer: Self-pay | Admitting: *Deleted

## 2021-07-13 DIAGNOSIS — C3490 Malignant neoplasm of unspecified part of unspecified bronchus or lung: Secondary | ICD-10-CM

## 2021-07-13 DIAGNOSIS — Z5111 Encounter for antineoplastic chemotherapy: Secondary | ICD-10-CM | POA: Diagnosis not present

## 2021-07-13 LAB — RAD ONC ARIA SESSION SUMMARY
Course Elapsed Days: 36
Plan Fractions Treated to Date: 27
Plan Prescribed Dose Per Fraction: 2 Gy
Plan Total Fractions Prescribed: 33
Plan Total Prescribed Dose: 66 Gy
Reference Point Dosage Given to Date: 54 Gy
Reference Point Session Dosage Given: 2 Gy
Session Number: 27

## 2021-07-13 LAB — CBC WITH DIFFERENTIAL/PLATELET
Abs Immature Granulocytes: 0.35 10*3/uL — ABNORMAL HIGH (ref 0.00–0.07)
Basophils Absolute: 0 10*3/uL (ref 0.0–0.1)
Basophils Relative: 0 %
Eosinophils Absolute: 0.1 10*3/uL (ref 0.0–0.5)
Eosinophils Relative: 0 %
HCT: 35.3 % — ABNORMAL LOW (ref 36.0–46.0)
Hemoglobin: 11.9 g/dL — ABNORMAL LOW (ref 12.0–15.0)
Immature Granulocytes: 2 %
Lymphocytes Relative: 3 %
Lymphs Abs: 0.4 10*3/uL — ABNORMAL LOW (ref 0.7–4.0)
MCH: 31.4 pg (ref 26.0–34.0)
MCHC: 33.7 g/dL (ref 30.0–36.0)
MCV: 93.1 fL (ref 80.0–100.0)
Monocytes Absolute: 0.9 10*3/uL (ref 0.1–1.0)
Monocytes Relative: 6 %
Neutro Abs: 12.8 10*3/uL — ABNORMAL HIGH (ref 1.7–7.7)
Neutrophils Relative %: 89 %
Platelets: 200 10*3/uL (ref 150–400)
RBC: 3.79 MIL/uL — ABNORMAL LOW (ref 3.87–5.11)
RDW: 14.3 % (ref 11.5–15.5)
Smear Review: NORMAL
WBC: 14.5 10*3/uL — ABNORMAL HIGH (ref 4.0–10.5)
nRBC: 0 % (ref 0.0–0.2)

## 2021-07-13 NOTE — Telephone Encounter (Signed)
Called pt and let her know that she did not need inj. Today and because your counts were good , she does not need it tom . Either. I told her that she only needs to come for radiation only.  ?She is agreeable to this ?

## 2021-07-14 ENCOUNTER — Other Ambulatory Visit: Payer: Self-pay

## 2021-07-14 ENCOUNTER — Inpatient Hospital Stay: Payer: Medicare Other

## 2021-07-14 ENCOUNTER — Ambulatory Visit
Admission: RE | Admit: 2021-07-14 | Discharge: 2021-07-14 | Disposition: A | Discharge status: Home / Self Care | Payer: Medicare Other | Source: Ambulatory Visit | Attending: Radiation Oncology | Admitting: Radiation Oncology

## 2021-07-14 DIAGNOSIS — Z5111 Encounter for antineoplastic chemotherapy: Secondary | ICD-10-CM | POA: Diagnosis not present

## 2021-07-14 LAB — RAD ONC ARIA SESSION SUMMARY
Course Elapsed Days: 37
Plan Fractions Treated to Date: 28
Plan Prescribed Dose Per Fraction: 2 Gy
Plan Total Fractions Prescribed: 33
Plan Total Prescribed Dose: 66 Gy
Reference Point Dosage Given to Date: 56 Gy
Reference Point Session Dosage Given: 2 Gy
Session Number: 28

## 2021-07-15 ENCOUNTER — Ambulatory Visit
Admission: RE | Admit: 2021-07-15 | Discharge: 2021-07-15 | Disposition: A | Discharge status: Home / Self Care | Payer: Medicare Other | Source: Ambulatory Visit | Attending: Radiation Oncology | Admitting: Radiation Oncology

## 2021-07-15 ENCOUNTER — Other Ambulatory Visit: Payer: Self-pay

## 2021-07-15 DIAGNOSIS — Z5111 Encounter for antineoplastic chemotherapy: Secondary | ICD-10-CM | POA: Diagnosis not present

## 2021-07-15 LAB — RAD ONC ARIA SESSION SUMMARY
Course Elapsed Days: 38
Plan Fractions Treated to Date: 29
Plan Prescribed Dose Per Fraction: 2 Gy
Plan Total Fractions Prescribed: 33
Plan Total Prescribed Dose: 66 Gy
Reference Point Dosage Given to Date: 58 Gy
Reference Point Session Dosage Given: 2 Gy
Session Number: 29

## 2021-07-16 ENCOUNTER — Ambulatory Visit
Admission: RE | Admit: 2021-07-16 | Discharge: 2021-07-16 | Disposition: A | Discharge status: Home / Self Care | Payer: Medicare Other | Source: Ambulatory Visit | Attending: Radiation Oncology | Admitting: Radiation Oncology

## 2021-07-16 ENCOUNTER — Other Ambulatory Visit: Payer: Self-pay

## 2021-07-16 DIAGNOSIS — Z5111 Encounter for antineoplastic chemotherapy: Secondary | ICD-10-CM | POA: Diagnosis not present

## 2021-07-16 LAB — RAD ONC ARIA SESSION SUMMARY
Course Elapsed Days: 39
Plan Fractions Treated to Date: 30
Plan Prescribed Dose Per Fraction: 2 Gy
Plan Total Fractions Prescribed: 33
Plan Total Prescribed Dose: 66 Gy
Reference Point Dosage Given to Date: 60 Gy
Reference Point Session Dosage Given: 2 Gy
Session Number: 30

## 2021-07-19 ENCOUNTER — Other Ambulatory Visit: Payer: Self-pay

## 2021-07-19 ENCOUNTER — Ambulatory Visit
Admission: RE | Admit: 2021-07-19 | Discharge: 2021-07-19 | Disposition: A | Discharge status: Home / Self Care | Payer: Medicare Other | Source: Ambulatory Visit | Attending: Radiation Oncology | Admitting: Radiation Oncology

## 2021-07-19 DIAGNOSIS — Z5111 Encounter for antineoplastic chemotherapy: Secondary | ICD-10-CM | POA: Diagnosis not present

## 2021-07-19 LAB — RAD ONC ARIA SESSION SUMMARY
Course Elapsed Days: 42
Plan Fractions Treated to Date: 31
Plan Prescribed Dose Per Fraction: 2 Gy
Plan Total Fractions Prescribed: 33
Plan Total Prescribed Dose: 66 Gy
Reference Point Dosage Given to Date: 62 Gy
Reference Point Session Dosage Given: 2 Gy
Session Number: 31

## 2021-07-20 ENCOUNTER — Other Ambulatory Visit: Payer: Self-pay

## 2021-07-20 ENCOUNTER — Ambulatory Visit
Admission: RE | Admit: 2021-07-20 | Discharge: 2021-07-20 | Disposition: A | Discharge status: Home / Self Care | Payer: Medicare Other | Source: Ambulatory Visit | Attending: Radiation Oncology | Admitting: Radiation Oncology

## 2021-07-20 DIAGNOSIS — Z5111 Encounter for antineoplastic chemotherapy: Secondary | ICD-10-CM | POA: Diagnosis not present

## 2021-07-20 LAB — RAD ONC ARIA SESSION SUMMARY
Course Elapsed Days: 43
Plan Fractions Treated to Date: 32
Plan Prescribed Dose Per Fraction: 2 Gy
Plan Total Fractions Prescribed: 33
Plan Total Prescribed Dose: 66 Gy
Reference Point Dosage Given to Date: 64 Gy
Reference Point Session Dosage Given: 2 Gy
Session Number: 32

## 2021-07-21 ENCOUNTER — Ambulatory Visit
Admission: RE | Admit: 2021-07-21 | Discharge: 2021-07-21 | Disposition: A | Discharge status: Home / Self Care | Payer: Medicare Other | Source: Ambulatory Visit | Attending: Radiation Oncology | Admitting: Radiation Oncology

## 2021-07-21 ENCOUNTER — Other Ambulatory Visit: Payer: Self-pay

## 2021-07-21 DIAGNOSIS — Z5111 Encounter for antineoplastic chemotherapy: Secondary | ICD-10-CM | POA: Diagnosis not present

## 2021-07-21 LAB — RAD ONC ARIA SESSION SUMMARY
Course Elapsed Days: 44
Plan Fractions Treated to Date: 33
Plan Prescribed Dose Per Fraction: 2 Gy
Plan Total Fractions Prescribed: 33
Plan Total Prescribed Dose: 66 Gy
Reference Point Dosage Given to Date: 66 Gy
Reference Point Session Dosage Given: 2 Gy
Session Number: 33

## 2021-07-26 ENCOUNTER — Ambulatory Visit (INDEPENDENT_AMBULATORY_CARE_PROVIDER_SITE_OTHER): Payer: Medicare Other | Admitting: Urology

## 2021-07-26 VITALS — BP 147/82 | HR 116 | Ht 61.0 in | Wt 166.0 lb

## 2021-07-26 DIAGNOSIS — R32 Unspecified urinary incontinence: Secondary | ICD-10-CM

## 2021-07-26 DIAGNOSIS — N3946 Mixed incontinence: Secondary | ICD-10-CM

## 2021-07-26 LAB — MICROSCOPIC EXAMINATION: Bacteria, UA: NONE SEEN

## 2021-07-26 LAB — URINALYSIS, COMPLETE
Bilirubin, UA: NEGATIVE
Glucose, UA: NEGATIVE
Ketones, UA: NEGATIVE
Nitrite, UA: NEGATIVE
Protein,UA: NEGATIVE
RBC, UA: NEGATIVE
Specific Gravity, UA: 1.025 (ref 1.005–1.030)
Urobilinogen, Ur: 0.2 mg/dL (ref 0.2–1.0)
pH, UA: 5.5 (ref 5.0–7.5)

## 2021-07-26 MED ORDER — MIRABEGRON ER 50 MG PO TB24: 50.0000 mg | ORAL_TABLET | Freq: Every day | ORAL | 11 refills | Status: DC

## 2021-07-26 NOTE — Progress Notes (Signed)
? ?07/26/2021 ?2:28 PM  ? ?Jennifer Hodges ?May 16, 1945 ?[id number] ? ?Referring provider: [name], MD ?[address] ?[place],  [place] [address] ? ?Chief Complaint  ?Patient presents with  ? Urinary Incontinence  ? ? ?HPI: ?I was consulted to assess the patient's voiding dysfunction.  She can leak with coughing sneezing.  She can have urge incontinence.  She describes foot on the floor syndrome.  She has mild bedwetting.  Most days she does not wear a pad. ?  ?She describes a prolapse repair and bladder suspension by gynecology and Dr. [name] about 6 years ago.  She describes emptying her bladder that he wants to keep an eye on like a cyst. ?  ?Her flow is sometimes poor sometimes good.  She has had a hysterectomy.  She is prone to constipation ?  ?I reviewed the medical records and the symptoms in 2018 were very similar with mild intermittent mixed incontinence generally not wearing a pad.  There was no reference of anything abnormal in the bladder.  She was using some local estrogen cream at the meatus. ?  ?She has had radiation and chemotherapy and is lung cancer free currently.  ?  ?Return in 3-5 weeks for cystoscopy for baseline looking bladder.  Call if urine culture is positive.  I think reassurance will be helpful.  I will check for any foreign body in the bladder and any vaginal issues ?  ?She does have mild mixed incontinence that is stable.  She does not want medication.  She sometimes wears a pad especially at night ?On physical examination she had a large grade 2 or mild grade 3 cystocele that just reached the introitus.  She lost 2 to 3 cm of vaginal length.  No stress incontinence ?  ?Cystoscopy: normal ? ?Reassurance given.  Watchful waiting for prolapse.  See as needed ? ?Today ?I last saw the patient in April 2022.  2 weeks ago the patient leaked without awareness which was new for her.  Now she has stress incontinence if she has a bad cough or sneeze.  She has urge incontinence.  She wears 1 or  2 pads a day moderately wet or soaked.  She has mild to moderate bedwetting. ? ?She is complaining of vaginal dryness.  Sometimes she has fecal matter with wiping but not daily. ? ?She has had neck and lumbar surgery.  She thinks she gets 2 bladder infections a year. ? ?She is on her second year of chemotherapy and radiation for bilateral lung cancer and perhaps a mediastinal mass ? ?On pelvic examination she had a well supported bladder neck and a high grade 1 cystocele.  She had a large posterior defect and may have an enterocele.  The cuff distended from approximately 8 cm to 4 5 cm.  She had moderate atrophy. ? ? ?PMH: ?Past Medical History:  ?Diagnosis Date  ? Anemia   ? Asthma   ? No Inhalers--Dr. [name] will order as needed  ? Bronchiectasis ([place])   ? mild  ? Chronic headaches   ?  followed by [facility] migraines  ? COPD (chronic obstructive pulmonary disease) ([place])   ? DDD (degenerative disc disease), lumbar   ? Diverticulosis   ? Family history of adverse reaction to anesthesia   ? mother and sisters-PONV  ? Gall stones   ? history of  ? GERD (gastroesophageal reflux disease)   ? EGD 8/09- non bleeding erosive gastritis, documentd esophageal ulcerations.   ? Hiatal  hernia   ? small  ? History of kidney stones   ? History of pneumonia   ? Hypercholesterolemia   ? IBS (irritable bowel syndrome)   ? Malignant neoplasm of upper lobe of left lung ([place]) 12/27/2018  ? Meniere disease   ? Murmur   ? Osteoarthritis   ? lumbar disc disease, left hip  ? Personal history of chemotherapy   ? Personal history of radiation therapy   ? Pneumonia 12/2020  ? PONV (postoperative nausea and vomiting)   ? Sleep apnea   ? uses cpap  ? Vertigo   ? Weakness of right side of body   ? ? ?Surgical History: ?Past Surgical History:  ?Procedure Laterality Date  ? ABDOMINAL HYSTERECTOMY  age 76  ? ANTERIOR CERVICAL DECOMP/DISCECTOMY FUSION N/A 02/24/2015  ? Procedure: CERVICAL FOUR-FIVE, CERVICAL FIVE-SIX, CERVICAL SIX-SEVEN  ANTERIOR CERVICAL DECOMPRESSION/DISCECTOMY FUSION ;  Surgeon: [name], MD;  Location: [facility];  Service: Neurosurgery;  Laterality: N/A;  C45 C56 C67 anterior cervical decompression with fusion interbody prosthesis plating and bonegraft  ? APPENDECTOMY    ? BACK SURGERY  1988  ? 4th lumbar fusion  ? BLADDER SURGERY N/A   ? with vaginal wall repair  ? BREAST CYST ASPIRATION Bilateral   ? neg  ? BREAST SURGERY Bilateral   ? cyst removed and reduction  ? CARDIAC CATHETERIZATION  2014  ? CARPAL TUNNEL RELEASE Right 02/11/2016  ? Procedure: CARPAL TUNNEL RELEASE;  Surgeon: [name], MD;  Location: [facility];  Service: Orthopedics;  Laterality: Right;  ? CATARACT EXTRACTION W/ INTRAOCULAR LENS IMPLANT Bilateral 2015  ? CHOLECYSTECTOMY    ? EUS N/A 05/31/2012  ? Procedure: UPPER ENDOSCOPIC ULTRASOUND (EUS) LINEAR;  Surgeon: [name], MD;  [facility];  Service: Endoscopy;  Laterality: N/A;  ? EXCISIONAL HEMORRHOIDECTOMY    ? JOINT REPLACEMENT Bilateral   ? KNEE ARTHROSCOPY WITH LATERAL MENISECTOMY Right 07/07/2015  ? Procedure: KNEE ARTHROSCOPY WITH LATERAL MENISECTOMY, PARTIAL SYNOVECTOMY;  Surgeon: [name], MD;  Location: [facility];  Service: Orthopedics;  Laterality: Right;  ? LUMBAR LAMINECTOMY    ? PORTA CATH INSERTION N/A 01/07/2019  ? Procedure: PORTA CATH INSERTION;  Surgeon: [name], MD;  Location: [facility];  Service: Cardiovascular;  Laterality: N/A;  ? REDUCTION MAMMAPLASTY  1990  ? RIGHT OOPHORECTOMY    ? TOTAL HIP ARTHROPLASTY Left 05/01/2014  ? Dr. [name]  ? TOTAL HIP ARTHROPLASTY Right 08/05/2014  ? Procedure: TOTAL HIP ARTHROPLASTY ANTERIOR APPROACH;  Surgeon: [name], MD;  Location: [facility];  Service: Orthopedics;  Laterality: Right;  ? ULNAR NERVE TRANSPOSITION Right 02/11/2016  ? Procedure: ULNAR NERVE DECOMPRESSION/TRANSPOSITION;  Surgeon: [name], MD;  Location: [facility];  Service: Orthopedics;  Laterality: Right;  ? VIDEO  BRONCHOSCOPY WITH ENDOBRONCHIAL ULTRASOUND Left 12/19/2018  ? Procedure: VIDEO BRONCHOSCOPY WITH ENDOBRONCHIAL ULTRASOUND, LEFT, SLEEP APNEA;  Surgeon: [name], MD;  Location: [facility];  Service: Thoracic;  Laterality: Left;  ? VIDEO BRONCHOSCOPY WITH ENDOBRONCHIAL ULTRASOUND Right 05/14/2021  ? Procedure: VIDEO BRONCHOSCOPY WITH ENDOBRONCHIAL ULTRASOUND;  Surgeon: Tyler Pita, MD;  Location: [facility];  Service: Pulmonary;  Laterality: Right;  ? ? ?Home Medications:  ?Allergies as of 07/26/2021   ? ?   Reactions  ? Aspirin Hives, Shortness Of Breath, Other (See Comments)  ? Difficulty breathing  ? Celebrex [celecoxib] Shortness Of Breath  ? Morphine And Related Nausea And Vomiting, Swelling  ? Adhesive [tape] Other (See Comments)  ?  Took top layer of skin off when removed.  ? Clarithromycin Nausea And Vomiting  ? Codeine Nausea And Vomiting  ? Darvon [propoxyphene Hcl] Nausea And Vomiting  ? Demerol [meperidine] Nausea And Vomiting  ? Flonase [fluticasone Propionate] Other (See Comments)  ? Fungal infection in nose  ? Simvastatin Other (See Comments)  ? "caused ulcers in mouth, and fever"  ? Talwin [pentazocine] Nausea And Vomiting  ? ?  ? ?  ?Medication List  ?  ? ?  ? Accurate as of Jul 26, 2021  2:28 PM. If you have any questions, ask your nurse or doctor.  ?  ?  ? ?  ? ?albuterol 108 (90 Base) MCG/ACT inhaler ?Commonly known as: VENTOLIN HFA ?Inhale 2 puffs into the lungs every 6 (six) hours as needed for wheezing or shortness of breath. ?  ?[place] Dream Arthritis 0.025 % Crea ?Generic drug: Histamine Dihydrochloride ?Apply 1 application topically 4 (four) times daily as needed (pain). ?  ?CAL-MAG-ZINC PO ?Take 1 tablet by mouth daily. ?  ?carboxymethylcellulose 0.5 % Soln ?Commonly known as: REFRESH PLUS ?Place 1 drop into both eyes 3 (three) times daily as needed (dry eyes). ?  ?cephALEXin 500 MG capsule ?Commonly known as: KEFLEX ?Take 1 capsule (500 mg total) by mouth 4 (four) times daily. ?   ?cetirizine 5 MG tablet ?Commonly known as: ZYRTEC ?Take 1 tablet (5 mg total) by mouth daily. ?  ?Co Q 10 100 MG Caps ?Take 100 mg by mouth daily. ?  ?cyclobenzaprine 10 MG tablet ?Commonly known as: FLEXERIL

## 2021-07-27 MED FILL — Dexamethasone Sodium Phosphate Inj 100 MG/10ML: INTRAMUSCULAR | Qty: 1 | Status: AC

## 2021-07-27 MED FILL — Fosaprepitant Dimeglumine For IV Infusion 150 MG (Base Eq): INTRAVENOUS | Qty: 5 | Status: AC

## 2021-07-28 ENCOUNTER — Encounter: Payer: Self-pay | Admitting: Oncology

## 2021-07-28 ENCOUNTER — Inpatient Hospital Stay (HOSPITAL_BASED_OUTPATIENT_CLINIC_OR_DEPARTMENT_OTHER): Payer: Medicare Other | Admitting: Oncology

## 2021-07-28 ENCOUNTER — Inpatient Hospital Stay: Payer: Medicare Other

## 2021-07-28 VITALS — BP 114/69 | HR 96 | Temp 98.2°F | Resp 16 | Wt 167.5 lb

## 2021-07-28 DIAGNOSIS — Z5111 Encounter for antineoplastic chemotherapy: Secondary | ICD-10-CM

## 2021-07-28 DIAGNOSIS — C3490 Malignant neoplasm of unspecified part of unspecified bronchus or lung: Secondary | ICD-10-CM

## 2021-07-28 DIAGNOSIS — C3412 Malignant neoplasm of upper lobe, left bronchus or lung: Secondary | ICD-10-CM

## 2021-07-28 LAB — COMPREHENSIVE METABOLIC PANEL
ALT: 20 U/L (ref 0–44)
AST: 25 U/L (ref 15–41)
Albumin: 3.7 g/dL (ref 3.5–5.0)
Alkaline Phosphatase: 64 U/L (ref 38–126)
Anion gap: 5 (ref 5–15)
BUN: 16 mg/dL (ref 8–23)
CO2: 27 mmol/L (ref 22–32)
Calcium: 8.8 mg/dL — ABNORMAL LOW (ref 8.9–10.3)
Chloride: 104 mmol/L (ref 98–111)
Creatinine, Ser: 0.52 mg/dL (ref 0.44–1.00)
GFR, Estimated: >60 mL/min (ref >60)
Glucose, Bld: 94 mg/dL (ref 70–99)
Potassium: 4.1 mmol/L (ref 3.5–5.1)
Sodium: 136 mmol/L (ref 135–145)
Total Bilirubin: 0.4 mg/dL (ref 0.3–1.2)
Total Protein: 6.2 g/dL — ABNORMAL LOW (ref 6.5–8.1)

## 2021-07-28 LAB — CBC WITH DIFFERENTIAL/PLATELET
Abs Immature Granulocytes: 0.01 10*3/uL (ref 0.00–0.07)
Basophils Absolute: 0 10*3/uL (ref 0.0–0.1)
Basophils Relative: 1 %
Eosinophils Absolute: 0 10*3/uL (ref 0.0–0.5)
Eosinophils Relative: 1 %
HCT: 30.9 % — ABNORMAL LOW (ref 36.0–46.0)
Hemoglobin: 10.2 g/dL — ABNORMAL LOW (ref 12.0–15.0)
Immature Granulocytes: 0 %
Lymphocytes Relative: 12 %
Lymphs Abs: 0.4 10*3/uL — ABNORMAL LOW (ref 0.7–4.0)
MCH: 32 pg (ref 26.0–34.0)
MCHC: 33 g/dL (ref 30.0–36.0)
MCV: 96.9 fL (ref 80.0–100.0)
Monocytes Absolute: 0.4 10*3/uL (ref 0.1–1.0)
Monocytes Relative: 14 %
Neutro Abs: 2.2 10*3/uL (ref 1.7–7.7)
Neutrophils Relative %: 72 %
Platelets: 207 10*3/uL (ref 150–400)
RBC: 3.19 MIL/uL — ABNORMAL LOW (ref 3.87–5.11)
RDW: 16.8 % — ABNORMAL HIGH (ref 11.5–15.5)
WBC: 3.1 10*3/uL — ABNORMAL LOW (ref 4.0–10.5)
nRBC: 0 % (ref 0.0–0.2)

## 2021-07-28 MED ORDER — HEPARIN SOD (PORK) LOCK FLUSH 100 UNIT/ML IV SOLN
500.0000 [IU] | Freq: Once | INTRAVENOUS | Status: DC | PRN
  Filled 2021-07-28: qty 5

## 2021-07-28 MED ORDER — SODIUM CHLORIDE 0.9 % IV SOLN
150.0000 mg | Freq: Once | INTRAVENOUS | Status: AC
  Administered 2021-07-28: 150 mg via INTRAVENOUS
  Filled 2021-07-28: qty 150

## 2021-07-28 MED ORDER — HEPARIN SOD (PORK) LOCK FLUSH 100 UNIT/ML IV SOLN
INTRAVENOUS | Status: AC
  Administered 2021-07-28: 500 [IU]
  Filled 2021-07-28: qty 5

## 2021-07-28 MED ORDER — PALONOSETRON HCL INJECTION 0.25 MG/5ML
0.2500 mg | Freq: Once | INTRAVENOUS | Status: AC
  Administered 2021-07-28: 0.25 mg via INTRAVENOUS
  Filled 2021-07-28: qty 5

## 2021-07-28 MED ORDER — SODIUM CHLORIDE 0.9 % IV SOLN
394.0000 mg | Freq: Once | INTRAVENOUS | Status: AC
  Administered 2021-07-28: 390 mg via INTRAVENOUS
  Filled 2021-07-28: qty 39

## 2021-07-28 MED ORDER — SODIUM CHLORIDE 0.9 % IV SOLN
10.0000 mg | Freq: Once | INTRAVENOUS | Status: AC
  Administered 2021-07-28: 10 mg via INTRAVENOUS
  Filled 2021-07-28: qty 10

## 2021-07-28 MED ORDER — SODIUM CHLORIDE 0.9 % IV SOLN: 662.5000 mg | Freq: Once | INTRAVENOUS | Status: DC

## 2021-07-28 MED ORDER — SODIUM CHLORIDE 0.9 % IV SOLN
500.0000 mg/m2 | Freq: Once | INTRAVENOUS | Status: AC
  Administered 2021-07-28: 900 mg via INTRAVENOUS
  Filled 2021-07-28: qty 20

## 2021-07-28 MED ORDER — SODIUM CHLORIDE 0.9 % IV SOLN
Freq: Once | INTRAVENOUS | Status: AC
  Filled 2021-07-28: qty 250

## 2021-07-28 NOTE — Progress Notes (Signed)
? ? ? ?Hematology/Oncology Consult note ?[place]  ?[phone] [phone] ? ?Patient Care Team: ?[name], MD as PCP - General (Family Medicine) ?Telford Nab, RN as Equities trader ?Noreene Filbert, MD as Referring Physician (Radiation Oncology) ?Sindy Guadeloupe, MD as Consulting Physician (Oncology)  ? ?Name of the patient: Jennifer Hodges  ?[id number]  ?1945-04-28  ? ?Date of visit: 07/28/21 ? 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 ?Chief complaint/ Reason for visit-on treatment assessment prior to cycle 4 of carbo Alimta chemotherapy ? ?Heme/Onc history:  Patient is a 76 year old female diagnosed with T1b N2 M0 stage III adenocarcinoma of the lung in September 2020.  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  EGFR and Ros as well as MET testing negative. PD-L1 was 50% ?  ?Patient completed concurrent chemoradiation with weekly carbotaxol chemotherapy on 03/12/2019.  Maintenance durvalumab started in January 2021 after scan showed partial response.  Patient completed 1 year of maintenance durvalumab in February 2022 ?  ?Patient was found to have enlarging mediastinal adenopathyOn subsequent CT scan in January 2023.  This was followed by a PET scan which showed hypermetabolic mediastinal and right hilar lymph nodes but no other evidence of disease elsewhere.  This was biopsied and was consistent with non-small cell lung cancer specifically adenocarcinoma.  Cells were positive for TTF-1 and Napsin A and negative for p40. ?  ?Plan is to proceed with concurrent chemoradiation with carboplatin and Alimta with consideration for maintenance Alimta ? ?Interval history-she has baseline nonproductive cough which is overall stable.  Denies any new complaints at this time ? ?ECOG PS- 1 ?Pain scale- 0 ?Opioid associated  constipation- no ? ?Review of systems- Review of Systems  ?Constitutional:  Negative for chills, fever, malaise/fatigue and weight loss.  ?HENT:  Negative for congestion, ear discharge and nosebleeds.   ?Eyes:  Negative for blurred vision.  ?Respiratory:  Positive for cough. Negative for hemoptysis, sputum production, shortness of breath and wheezing.   ?Cardiovascular:  Negative for chest pain, palpitations, orthopnea and claudication.  ?Gastrointestinal:  Negative for abdominal pain, blood in stool, constipation, diarrhea, heartburn, melena, nausea and vomiting.  ?Genitourinary:  Negative for dysuria, flank pain, frequency, hematuria and urgency.  ?Musculoskeletal:  Negative for back pain, joint pain and myalgias.  ?Skin:  Negative for rash.  ?Neurological:  Negative for dizziness, tingling, focal weakness, seizures, weakness and headaches.  ?Endo/Heme/Allergies:  Does not bruise/bleed easily.  ?Psychiatric/Behavioral:  Negative for depression and suicidal ideas. The patient does not have insomnia.    ? ? ? ?Allergies  ?Allergen Reactions  ? Aspirin Hives, Shortness Of Breath and Other (See Comments)  ?  Difficulty breathing  ? Celebrex [Celecoxib] Shortness Of Breath  ? Morphine And Related Nausea And Vomiting and Swelling  ? Adhesive [Tape] Other (See Comments)  ?  Took top layer of skin off when removed.  ? Clarithromycin Nausea And Vomiting  ? Codeine Nausea And Vomiting  ? Darvon [Propoxyphene Hcl] Nausea And Vomiting  ? Demerol [Meperidine] Nausea And Vomiting  ? Flonase [Fluticasone Propionate] Other (See Comments)  ?  Fungal infection in nose  ? Simvastatin Other (See Comments)  ?  "caused ulcers in mouth, and fever"  ? Talwin [Pentazocine] Nausea And Vomiting  ? ? ? ?Past Medical History:  ?Diagnosis Date  ? Anemia   ? Asthma   ? No Inhalers--Dr.  [name] will order as needed  ? Bronchiectasis ([place])   ? mild  ? Chronic headaches   ?  followed by [facility] migraines  ? COPD (chronic obstructive  pulmonary disease) ([place])   ? DDD (degenerative disc disease), lumbar   ? Diverticulosis   ? Family history of adverse reaction to anesthesia   ? mother and sisters-PONV  ? Gall stones   ? history of  ? GERD (gastroesophageal reflux disease)   ? EGD 8/09- non bleeding erosive gastritis, documentd esophageal ulcerations.   ? Hiatal hernia   ? small  ? History of kidney stones   ? History of pneumonia   ? Hypercholesterolemia   ? IBS (irritable bowel syndrome)   ? Malignant neoplasm of upper lobe of left lung ([place]) 12/27/2018  ? Meniere disease   ? Murmur   ? Osteoarthritis   ? lumbar disc disease, left hip  ? Personal history of chemotherapy   ? Personal history of radiation therapy   ? Pneumonia 12/2020  ? PONV (postoperative nausea and vomiting)   ? Sleep apnea   ? uses cpap  ? Vertigo   ? Weakness of right side of body   ? ? ? ?Past Surgical History:  ?Procedure Laterality Date  ? ABDOMINAL HYSTERECTOMY  age 46  ? ANTERIOR CERVICAL DECOMP/DISCECTOMY FUSION N/A 02/24/2015  ? Procedure: CERVICAL FOUR-FIVE, CERVICAL FIVE-SIX, CERVICAL SIX-SEVEN ANTERIOR CERVICAL DECOMPRESSION/DISCECTOMY FUSION ;  Surgeon: [name], MD;  Location: [facility]URO ORS;  Service: Neurosurgery;  Laterality: N/A;  C45 C56 C67 anterior cervical decompression with fusion interbody prosthesis plating and bonegraft  ? APPENDECTOMY    ? BACK SURGERY  1988  ? 4th lumbar fusion  ? BLADDER SURGERY N/A   ? with vaginal wall repair  ? BREAST CYST ASPIRATION Bilateral   ? neg  ? BREAST SURGERY Bilateral   ? cyst removed and reduction  ? CARDIAC CATHETERIZATION  2014  ? CARPAL TUNNEL RELEASE Right 02/11/2016  ? Procedure: CARPAL TUNNEL RELEASE;  Surgeon: [name], MD;  Location: [facility] ORS;  Service: Orthopedics;  Laterality: Right;  ? CATARACT EXTRACTION W/ INTRAOCULAR LENS IMPLANT Bilateral 2015  ? CHOLECYSTECTOMY    ? EUS N/A 05/31/2012  ? Procedure: UPPER ENDOSCOPIC ULTRASOUND (EUS) LINEAR;  Surgeon: [name], MD;  Location: [facility]  ENDOSCOPY;  Service: Endoscopy;  Laterality: N/A;  ? EXCISIONAL HEMORRHOIDECTOMY    ? JOINT REPLACEMENT Bilateral   ? KNEE ARTHROSCOPY WITH LATERAL MENISECTOMY Right 07/07/2015  ? Procedure: KNEE ARTHROSCOPY WITH LATERAL MENISECTOMY, PARTIAL SYNOVECTOMY;  Surgeon: [name], MD;  Location: [facility] ORS;  Service: Orthopedics;  Laterality: Right;  ? LUMBAR LAMINECTOMY    ? PORTA CATH INSERTION N/A 01/07/2019  ? Procedure: PORTA CATH INSERTION;  Surgeon: [name], MD;  Location: [facility] CV LAB;  Service: Cardiovascular;  Laterality: N/A;  ? REDUCTION MAMMAPLASTY  1990  ? RIGHT OOPHORECTOMY    ? TOTAL HIP ARTHROPLASTY Left 05/01/2014  ? Dr. [name]  ? TOTAL HIP ARTHROPLASTY Right 08/05/2014  ? Procedure: TOTAL HIP ARTHROPLASTY ANTERIOR APPROACH;  Surgeon: [name], MD;  Location: [facility] ORS;  Service: Orthopedics;  Laterality: Right;  ? ULNAR NERVE TRANSPOSITION Right 02/11/2016  ? Procedure: ULNAR NERVE DECOMPRESSION/TRANSPOSITION;  Surgeon: [name], MD;  Location: [facility] ORS;  Service: Orthopedics;  Laterality: Right;  ? VIDEO BRONCHOSCOPY WITH ENDOBRONCHIAL ULTRASOUND Left 12/19/2018  ? Procedure: VIDEO BRONCHOSCOPY WITH ENDOBRONCHIAL ULTRASOUND, LEFT, SLEEP APNEA;  Surgeon: [name], MD;  Location: [facility] ORS;  Service: Thoracic;  Laterality: Left;  ? VIDEO BRONCHOSCOPY WITH ENDOBRONCHIAL ULTRASOUND Right 05/14/2021  ? Procedure: VIDEO BRONCHOSCOPY WITH ENDOBRONCHIAL ULTRASOUND;  Surgeon: Tyler Pita, MD;  Location: [facility] ORS;  Service: Pulmonary;  Laterality: Right;  ? ? ?Social History  ? ?Socioeconomic History  ? Marital status: Married  ?  Spouse name: Not on file  ? Number of children: Not on file  ? Years of education: Not on file  ? Highest education level: Not on file  ?Occupational History  ? Not on file  ?Tobacco Use  ? Smoking status: Former  ?  Packs/day: 1.00  ?  Years: 40.00  ?  Pack years: 40.00  ?  Types: Cigarettes  ?  Quit date: 06/13/2007  ?  Years since quitting: 14.1   ? Smokeless tobacco: Never  ?Vaping Use  ? Vaping Use: Never used  ?Substance and Sexual Activity  ? Alcohol use: No  ?  Alcohol/week: 0.0 standard drinks  ? Drug use: No  ? Sexual activity: Not on file  ?Other Topics C

## 2021-07-28 NOTE — Progress Notes (Signed)
Pt has a dry cough going on three weeks; takes [facility] for it; a hard time getting mucus up.  ?

## 2021-07-28 NOTE — Patient Instructions (Signed)
[facility] CANCER CTR AT [place]  Discharge Instructions: ?Thank you for choosing [facility] to provide your oncology and hematology care.  ?If you have a lab appointment with the [facility], please go directly to the [facility] and check in at the registration area. ? ?Wear comfortable clothing and clothing appropriate for easy access to any Portacath or PICC line.  ? ?We strive to give you quality time with your provider. You may need to reschedule your appointment if you arrive late (15 or more minutes).  Arriving late affects you and other patients whose appointments are after yours.  Also, if you miss three or more appointments without notifying the office, you may be dismissed from the clinic at the provider?s discretion.    ?  ?For prescription refill requests, have your pharmacy contact our office and allow 72 hours for refills to be completed.   ? ?Today you received the following chemotherapy and/or immunotherapy agents: Alimta, Carboplatin    ?  ?To help prevent nausea and vomiting after your treatment, we encourage you to take your nausea medication as directed. ? ?BELOW ARE SYMPTOMS THAT SHOULD BE REPORTED IMMEDIATELY: ?*FEVER GREATER THAN 100.4 F (38 ?C) OR HIGHER ?*CHILLS OR SWEATING ?*NAUSEA AND VOMITING THAT IS NOT CONTROLLED WITH YOUR NAUSEA MEDICATION ?*UNUSUAL SHORTNESS OF BREATH ?*UNUSUAL BRUISING OR BLEEDING ?*URINARY PROBLEMS (pain or burning when urinating, or frequent urination) ?*BOWEL PROBLEMS (unusual diarrhea, constipation, pain near the anus) ?TENDERNESS IN MOUTH AND THROAT WITH OR WITHOUT PRESENCE OF ULCERS (sore throat, sores in mouth, or a toothache) ?UNUSUAL RASH, SWELLING OR PAIN  ?UNUSUAL VAGINAL DISCHARGE OR ITCHING  ? ?Items with * indicate a potential emergency and should be followed up as soon as possible or go to the Emergency Department if any problems should occur. ? ?Please show the CHEMOTHERAPY ALERT CARD or IMMUNOTHERAPY ALERT CARD at  check-in to the Emergency Department and triage nurse. ? ?Should you have questions after your visit or need to cancel or reschedule your appointment, please contact [facility] CANCER [place] AT [place]  [phone] and follow the prompts.  Office hours are 8:00 a.m. to 4:30 p.m. Monday - Friday. Please note that voicemails left after 4:00 p.m. may not be returned until the following business day.  We are closed weekends and major holidays. You have access to a nurse at all times for urgent questions. Please call the main number to the clinic [phone] and follow the prompts. ? ?For any non-urgent questions, you may also contact your provider using MyChart. We now offer e-Visits for anyone 76 and older to request care online for non-urgent symptoms. For details visit [email or web address]. ?  ?Also download the MyChart app! Go to the app store, search "MyChart", open the app, select [facility], and log in with your MyChart username and password. ? ?Due to Covid, a mask is required upon entering the hospital/clinic. If you do not have a mask, one will be given to you upon arrival. For doctor visits, patients may have 1 support person aged 71 or older with them. For treatment visits, patients cannot have anyone with them due to current Covid guidelines and our immunocompromised population.  ?

## 2021-07-29 LAB — CULTURE, URINE COMPREHENSIVE

## 2021-07-30 ENCOUNTER — Inpatient Hospital Stay: Payer: Medicare Other

## 2021-07-30 DIAGNOSIS — Z5111 Encounter for antineoplastic chemotherapy: Secondary | ICD-10-CM | POA: Diagnosis not present

## 2021-07-30 DIAGNOSIS — C3412 Malignant neoplasm of upper lobe, left bronchus or lung: Secondary | ICD-10-CM

## 2021-07-30 MED ORDER — PEGFILGRASTIM-CBQV 6 MG/0.6ML ~~LOC~~ SOSY
6.0000 mg | PREFILLED_SYRINGE | Freq: Once | SUBCUTANEOUS | Status: AC
  Administered 2021-07-30: 6 mg via SUBCUTANEOUS
  Filled 2021-07-30: qty 0.6

## 2021-08-06 ENCOUNTER — Emergency Department
Admission: EM | Admit: 2021-08-06 | Discharge: 2021-08-06 | Disposition: A | Discharge status: Home / Self Care | Payer: Medicare Other | Attending: Emergency Medicine | Admitting: Emergency Medicine

## 2021-08-06 ENCOUNTER — Other Ambulatory Visit: Payer: Self-pay

## 2021-08-06 ENCOUNTER — Emergency Department: Payer: Medicare Other

## 2021-08-06 ENCOUNTER — Other Ambulatory Visit: Payer: Medicare Other

## 2021-08-06 ENCOUNTER — Ambulatory Visit: Payer: Medicare Other

## 2021-08-06 ENCOUNTER — Telehealth: Payer: Self-pay | Admitting: *Deleted

## 2021-08-06 ENCOUNTER — Ambulatory Visit (INDEPENDENT_AMBULATORY_CARE_PROVIDER_SITE_OTHER): Payer: Medicare Other

## 2021-08-06 ENCOUNTER — Ambulatory Visit
Admission: EM | Admit: 2021-08-06 | Discharge: 2021-08-06 | Disposition: A | Discharge status: Home / Self Care | Payer: Medicare Other | Attending: Internal Medicine | Admitting: Internal Medicine

## 2021-08-06 DIAGNOSIS — J40 Bronchitis, not specified as acute or chronic: Secondary | ICD-10-CM | POA: Diagnosis not present

## 2021-08-06 DIAGNOSIS — R0602 Shortness of breath: Secondary | ICD-10-CM | POA: Diagnosis present

## 2021-08-06 DIAGNOSIS — J449 Chronic obstructive pulmonary disease, unspecified: Secondary | ICD-10-CM | POA: Diagnosis not present

## 2021-08-06 DIAGNOSIS — R55 Syncope and collapse: Secondary | ICD-10-CM | POA: Diagnosis not present

## 2021-08-06 DIAGNOSIS — R042 Hemoptysis: Secondary | ICD-10-CM | POA: Diagnosis not present

## 2021-08-06 DIAGNOSIS — R000 Tachycardia, unspecified: Secondary | ICD-10-CM | POA: Insufficient documentation

## 2021-08-06 DIAGNOSIS — J45909 Unspecified asthma, uncomplicated: Secondary | ICD-10-CM | POA: Insufficient documentation

## 2021-08-06 DIAGNOSIS — J209 Acute bronchitis, unspecified: Secondary | ICD-10-CM | POA: Diagnosis not present

## 2021-08-06 LAB — D-DIMER, QUANTITATIVE: D-Dimer, Quant: 0.83 ug/mL-FEU — ABNORMAL HIGH (ref 0.00–0.50)

## 2021-08-06 LAB — BASIC METABOLIC PANEL
Anion gap: 8 (ref 5–15)
BUN: 13 mg/dL (ref 8–23)
CO2: 29 mmol/L (ref 22–32)
Calcium: 9.1 mg/dL (ref 8.9–10.3)
Chloride: 103 mmol/L (ref 98–111)
Creatinine, Ser: 0.53 mg/dL (ref 0.44–1.00)
GFR, Estimated: >60 mL/min (ref >60)
Glucose, Bld: 100 mg/dL — ABNORMAL HIGH (ref 70–99)
Potassium: 3.9 mmol/L (ref 3.5–5.1)
Sodium: 140 mmol/L (ref 135–145)

## 2021-08-06 LAB — CBC
HCT: 31.4 % — ABNORMAL LOW (ref 36.0–46.0)
Hemoglobin: 10 g/dL — ABNORMAL LOW (ref 12.0–15.0)
MCH: 31.6 pg (ref 26.0–34.0)
MCHC: 31.8 g/dL (ref 30.0–36.0)
MCV: 99.4 fL (ref 80.0–100.0)
Platelets: 102 10*3/uL — ABNORMAL LOW (ref 150–400)
RBC: 3.16 MIL/uL — ABNORMAL LOW (ref 3.87–5.11)
RDW: 16.3 % — ABNORMAL HIGH (ref 11.5–15.5)
WBC: 13.4 10*3/uL — ABNORMAL HIGH (ref 4.0–10.5)
nRBC: 0 % (ref 0.0–0.2)

## 2021-08-06 LAB — LACTIC ACID, PLASMA: Lactic Acid, Venous: 1 mmol/L (ref 0.5–1.9)

## 2021-08-06 LAB — TROPONIN I (HIGH SENSITIVITY)
Troponin I (High Sensitivity): 13 ng/L (ref <18)
Troponin I (High Sensitivity): 14 ng/L (ref <18)

## 2021-08-06 MED ORDER — IOHEXOL 350 MG/ML SOLN
75.0000 mL | Freq: Once | INTRAVENOUS | Status: AC | PRN
  Administered 2021-08-06: 75 mL via INTRAVENOUS

## 2021-08-06 MED ORDER — PREDNISONE 20 MG PO TABS: 60.0000 mg | ORAL_TABLET | Freq: Every day | ORAL | 0 refills | Status: AC

## 2021-08-06 MED ORDER — AZITHROMYCIN 250 MG PO TABS: ORAL_TABLET | ORAL | 0 refills | Status: DC

## 2021-08-06 NOTE — ED Notes (Addendum)
Called for patient to be triaged- was in the restroom

## 2021-08-06 NOTE — ED Triage Notes (Signed)
Pt c/o cough with bloody phlegm, nausea, and congestion x5weeks  Pt is worried about possible pneumonia.   Pt states here highest blood pressure was 160/48 after radiation therapy

## 2021-08-06 NOTE — ED Provider Notes (Signed)
[facility] Provider Note    Event Date/Time   First MD Initiated Contact with Patient 08/06/21 2103     (approximate)   History   Chest Pain   HPI  Jennifer Hodges is a 76 y.o. female with a history of asthma, COPD, degenerative disc disease, IBS, and lung adenocarcinoma on chemo who presents with an episode of chest pain last night associated with some shortness of breath and tachycardia today.  The patient reports an episode last night of sharp pain from her left upper back rating to the left chest that lasted a few seconds.  She has been having cough with increased purulent sputum as well as chest congestion.  These have not been resolved by her albuterol.  She was seen at urgent care earlier today and diagnosed with likely bronchitis and sent home with a prescription for azithromycin.  Subsequently the patient noted intermittent tachycardia up to as high as 140 and had an episode of near syncope.  She was advised to come to the ED for further evaluation including possible rule out of PE.  She is not having any active chest pain at this time and has no significant shortness of breath when at rest.     Physical Exam   Triage Vital Signs: ED Triage Vitals  Enc Vitals Group     BP 08/06/21 1829 102/67     Pulse Rate 08/06/21 1829 (!) 103     Resp 08/06/21 1829 17     Temp 08/06/21 1829 98.7 F (37.1 C)     Temp Source 08/06/21 1829 Oral     SpO2 08/06/21 1829 97 %     Weight --      Height --      Head Circumference --      Peak Flow --      Pain Score 08/06/21 1830 7     Pain Loc --      Pain Edu? --      Excl. in [place]? --     Most recent vital signs: Vitals:   08/06/21 2200 08/06/21 2300  BP: 123/64 115/65  Pulse: 97 85  Resp: 20 18  Temp:    SpO2: 100% 94%     General: Alert and oriented, well-appearing. CV:  Good peripheral perfusion.  Resp:  Normal effort.  Lungs with faint wheezing bilaterally. Abd:  No distention.  Other:  No  peripheral edema.   ED Results / Procedures / Treatments   Labs (all labs ordered are listed, but only abnormal results are displayed) Labs Reviewed  BASIC METABOLIC PANEL - Abnormal; Notable for the following components:      Result Value   Glucose, Bld 100 (*)    All other components within normal limits  CBC - Abnormal; Notable for the following components:   WBC 13.4 (*)    RBC 3.16 (*)    Hemoglobin 10.0 (*)    HCT 31.4 (*)    RDW 16.3 (*)    Platelets 102 (*)    All other components within normal limits  D-DIMER, QUANTITATIVE - Abnormal; Notable for the following components:   D-Dimer, Quant 0.83 (*)    All other components within normal limits  LACTIC ACID, PLASMA  TROPONIN I (HIGH SENSITIVITY)  TROPONIN I (HIGH SENSITIVITY)     EKG  ED ECG REPORT I, Arta Silence, the attending physician, personally viewed and interpreted this ECG.  Date: 08/06/2021 EKG Time: 1843 Rate: 101 Rhythm: Sinus tachycardia  QRS Axis: normal Intervals: normal ST/T Wave abnormalities: normal Narrative Interpretation: no evidence of acute ischemia    RADIOLOGY  CT angio chest: I independently viewed and interpreted the images; there is no evidence of large pulmonary embolus or focal consolidation.  Radiology report indicates as follows:  IMPRESSION:  1. No evidence of pulmonary embolus.  2. Stable post therapeutic changes within the left parahilar region.  3. Stable bilateral lower lobe bronchial wall thickening and  scattered areas of mucous plugging, which could reflect bronchitis  or reactive airway disease. No acute airspace disease.  4. Decreased mediastinal and hilar adenopathy, with no residual  enlarged lymph nodes on this exam.  5. Stable 5 mm right lower lobe subpleural pulmonary nodule.  6. Aortic Atherosclerosis (ICD10-I70.0) and Emphysema (ICD10-J43.9).    PROCEDURES:  Critical Care performed: No  Procedures   MEDICATIONS ORDERED IN ED: Medications   iohexol (OMNIPAQUE) 350 MG/ML injection 75 mL (75 mLs Intravenous Contrast Given 08/06/21 2147)     IMPRESSION / MDM / ASSESSMENT AND PLAN / ED COURSE  I reviewed the triage vital signs and the nursing notes.  76 year old female with PMH as noted above presents with an episode of chest pain last night which has now resolved but also with increased productive cough, shortness of breath, and tachycardia.  On exam the patient is overall well-appearing.  She was slightly tachycardic on arrival with otherwise normal vital signs.  O2 saturation is in the high 90s on room air and the patient does not demonstrate any increased work of breathing or respiratory distress.  There is wheezing bilaterally.  Initial lab work-up is significant for elevated D-dimer and for mild leukocytosis.  Troponin is negative.  Differential diagnosis includes, but is not limited to, acute bronchitis, asthma/COPD exacerbation, pneumonia, or possible PE.  I have a low suspicion for ACS given the lack of CAD history, the negative initial troponin and the resolved chest pain.  Given how brief the episode of pain was and the patient's reassuring vital signs there is no clinical evidence for aortic dissection or other vascular cause.  Patient's presentation is most consistent with acute presentation with potential threat to life or bodily function.  We will obtain a CT angio of the chest, second troponin, lactate, and reassess.  The patient is on the cardiac monitor to evaluate for evidence of arrhythmia and/or significant heart rate changes.  ----------------------------------------- 11:02 PM on 08/06/2021 -----------------------------------------  CT angio shows no evidence of PE and is consistent with acute bronchitis.  Lactate is normal.  Repeat troponin is negative.  The patient's vital signs have remained stable throughout her ED stay over the last 5 hours.  On reassessment she is feeling well.  She is stable for  discharge home at this time.  I counseled her on the results of the work-up.  In addition to the azithromycin that she was already prescribed, I will prescribe prednisone.  She has albuterol at home.  Return precautions given, and she expresses understanding.   FINAL CLINICAL IMPRESSION(S) / ED DIAGNOSES   Final diagnoses:  Acute bronchitis, unspecified organism     Rx / DC Orders   ED Discharge Orders          Ordered    predniSONE (DELTASONE) 20 MG tablet  Daily with breakfast        08/06/21 2238             Note:  This document was prepared using Dragon voice recognition software and may include  unintentional dictation errors.    Arta Silence, MD 08/06/21 2303

## 2021-08-06 NOTE — ED Provider Triage Note (Signed)
Emergency Medicine Provider Triage Evaluation Note  Jennifer Hodges, Jennifer Hodges 76 y.o. female  was evaluated in triage.  Pt complains of chest pain and shortness of breath.  Patient reports that she is a cancer patient receiving chemo, and had an episode of an creased shortness of breath, hypertension, tachycardia this afternoon.  She had been recently seen in the walk-in clinic, and started on antibiotic course for presumed bronchitis.  She presents at this point with no acute distress, but at the advice of her PCP and [facility] is here for evaluation for possible PE.  Review of Systems  Positive: CP, SOB  Negative: FCS, hemoptysis  Physical Exam  BP 102/67 (BP Location: Right Arm)   Pulse (!) 103   Temp 98.7 F (37.1 C) (Oral)   Resp 17   SpO2 97%  Gen:   Awake, no distress  NAD Resp:  Normal effort CTA MSK:   Moves extremities without difficulty  CVS:  RRR  Medical Decision Making  Medically screening exam initiated at 7:15 PM.  Appropriate orders placed.  [name] was informed that the remainder of the evaluation will be completed by another provider, this initial triage assessment does not replace that evaluation, and the importance of remaining in the ED until their evaluation is complete.  Patient to the ED with a history of cancer and chronic recurrent bronchitis, presents to the ED at the advice of her PCP.  Patient is being sent to the ED to rule out PE.   Melvenia Needles, PA-C 08/06/21 1928

## 2021-08-06 NOTE — Discharge Instructions (Addendum)
Continue taking the azithromycin that was prescribed at the urgent care.  In addition you should take the prednisone prescribed today.  You should use your albuterol inhaler every 4-6 hours over the next few days.  Follow-up with your regular doctor next week  In the interim, return to the ER immediately for new, worsening, or persistent severe difficulty breathing, weakness or lightheadedness, palpitations or significantly elevated heart rate, O2 saturation below 90% on her home pulse oximeter, fever, chest pain, or any other new or worsening symptoms that concern you.

## 2021-08-06 NOTE — ED Notes (Signed)
Pt returned from CT °

## 2021-08-06 NOTE — ED Provider Notes (Signed)
[facility]    CSN: [id number] Arrival date & time: 08/06/21  1030      History   Chief Complaint Chief Complaint  Patient presents with   Cough    HPI Jennifer Hodges is a 76 y.o. female who presents due to feeling a sharp pain on her L upper back radiating to her L chest lasting for a few seconds. She also has been having streaks of blood with purulent mucous. She has had chest congestion x 5 weeks and been coughing up yellow to green mucous. Has been using her Albuterol neb and Asthma meds qd.     Past Medical History:  Diagnosis Date   Anemia    Asthma    No Inhalers--Dr. [name] will order as needed   Bronchiectasis ([place])    mild   Chronic headaches     followed by [facility] migraines   COPD (chronic obstructive pulmonary disease) (HCC)    DDD (degenerative disc disease), lumbar    Diverticulosis    Family history of adverse reaction to anesthesia    mother and sisters-PONV   Gall stones    history of   GERD (gastroesophageal reflux disease)    EGD 8/09- non bleeding erosive gastritis, documentd esophageal ulcerations.    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   Meniere disease    Murmur    Osteoarthritis    lumbar disc disease, left hip   Personal history of chemotherapy    Personal history of radiation therapy    Pneumonia 12/2020   PONV (postoperative nausea and vomiting)    Sleep apnea    uses cpap   Vertigo    Weakness of right side of body     Patient Active Problem List   Diagnosis Date Noted   Anemia 05/26/2021   Postobstructive pneumonia 01/06/2021   Benign breast lumps 01/11/2019   Gout 01/11/2019   Hives 01/11/2019   Lung disease 01/11/2019   Migraine headache 01/11/2019   Goals of care, counseling/discussion 12/27/2018   Malignant neoplasm of upper lobe of  left lung (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   Osteopenia of multiple sites 04/27/2016   Left carpal tunnel syndrome 02/02/2016   Carotid stenosis, asymptomatic, bilateral 12/29/2015   Prediabetes 09/28/2015   Primary osteoarthritis involving multiple joints 09/28/2015   Knee pain 05/11/2015   Chest tightness 03/24/2015   Stool incontinence 03/24/2015   Urinary frequency 03/04/2015   Cervical spondylosis with radiculopathy 02/24/2015   Pre-syncope 02/19/2015   URI (upper respiratory infection) 02/15/2015   Neck pain 12/25/2014   Pelvic pain in female 11/16/2014   Primary osteoarthritis of hip 08/05/2014   Heel pain 06/01/2014   Diarrhea 06/01/2014   Health care maintenance 06/01/2014   Right leg pain 03/30/2014   Right arm pain 03/30/2014   Sinusitis 03/30/2014   Internal nasal lesion 11/12/2013   Other specified disorders of nose and nasal sinuses 11/12/2013   Chronic tension-type headache, intractable 10/30/2013   Intractable migraine without aura and without status migrainosus 10/30/2013   Occipital neuralgia of left side 10/30/2013   IBS (irritable bowel syndrome) 09/03/2013   Rib pain 08/04/2013   Cervical radiculitis 08/01/2013   Lumbar radiculitis 08/01/2013   Palpitations 07/25/2013   Benign essential  hypertension 05/28/2013   Neuralgia, neuritis, and radiculitis, unspecified 05/28/2013   Vitamin D deficiency 05/28/2013   Elevated AFP 04/28/2013   Abnormality of alpha-fetoprotein 04/28/2013   Osteoporosis 11/12/2012   Incomplete emptying of bladder 07/11/2012   Prolapse of vaginal vault after hysterectomy 07/11/2012   Nonspecific (abnormal) findings on radiological and other examination of gastrointestinal tract 05/31/2012   Bronchiectasis ([place]) 02/05/2012   Sleep apnea 02/05/2012   Degenerative disc disease, cervical 02/05/2012   Hypercholesterolemia 02/05/2012   GERD (gastroesophageal reflux disease) 02/05/2012    Chronic headaches 02/05/2012    Past Surgical History:  Procedure Laterality Date   ABDOMINAL HYSTERECTOMY  age 61   ANTERIOR CERVICAL DECOMP/DISCECTOMY FUSION N/A 02/24/2015   Procedure: CERVICAL FOUR-FIVE, CERVICAL FIVE-SIX, CERVICAL SIX-SEVEN ANTERIOR CERVICAL DECOMPRESSION/DISCECTOMY FUSION ;  Surgeon: [name], MD;  Location: [facility];  Service: Neurosurgery;  Laterality: N/A;  C45 C56 C67 anterior cervical decompression with fusion interbody prosthesis plating and bonegraft   APPENDECTOMY     BACK SURGERY  1988   4th lumbar fusion   BLADDER SURGERY N/A    with vaginal wall repair   BREAST CYST ASPIRATION Bilateral    neg   BREAST SURGERY Bilateral    cyst removed and reduction   CARDIAC CATHETERIZATION  2014   CARPAL TUNNEL RELEASE Right 02/11/2016   Procedure: CARPAL TUNNEL RELEASE;  Surgeon: [name], MD;  Location: [facility];  Service: Orthopedics;  Laterality: Right;   CATARACT EXTRACTION W/ INTRAOCULAR LENS IMPLANT Bilateral 2015   CHOLECYSTECTOMY     EUS N/A 05/31/2012   Procedure: UPPER ENDOSCOPIC ULTRASOUND (EUS) LINEAR;  Surgeon: [name], MD;  Location: [facility];  Service: Endoscopy;  Laterality: N/A;   EXCISIONAL HEMORRHOIDECTOMY     JOINT REPLACEMENT Bilateral    KNEE ARTHROSCOPY WITH LATERAL MENISECTOMY Right 07/07/2015   Procedure: KNEE ARTHROSCOPY WITH LATERAL MENISECTOMY, PARTIAL SYNOVECTOMY;  Surgeon: [name], MD;  Location: [facility];  Service: Orthopedics;  Laterality: Right;   LUMBAR LAMINECTOMY     PORTA CATH INSERTION N/A 01/07/2019   Procedure: PORTA CATH INSERTION;  Surgeon: [name], MD;  Location: [facility];  Service: Cardiovascular;  Laterality: N/A;   REDUCTION MAMMAPLASTY  1990   RIGHT OOPHORECTOMY     TOTAL HIP ARTHROPLASTY Left 05/01/2014   Dr. [name]   TOTAL HIP ARTHROPLASTY Right 08/05/2014   Procedure: TOTAL HIP ARTHROPLASTY ANTERIOR APPROACH;  Surgeon: [name], MD;  Location: [facility];  Service: Orthopedics;  Laterality: Right;   ULNAR NERVE TRANSPOSITION Right 02/11/2016   Procedure: ULNAR NERVE DECOMPRESSION/TRANSPOSITION;  Surgeon: [name], MD;  Location: [facility];  Service: Orthopedics;  Laterality: Right;   VIDEO BRONCHOSCOPY WITH ENDOBRONCHIAL ULTRASOUND Left 12/19/2018   Procedure: VIDEO BRONCHOSCOPY WITH ENDOBRONCHIAL ULTRASOUND, LEFT, SLEEP APNEA;  Surgeon: [name], MD;  Location: [facility];  Service: Thoracic;  Laterality: Left;   VIDEO BRONCHOSCOPY WITH ENDOBRONCHIAL ULTRASOUND Right 05/14/2021   Procedure: VIDEO BRONCHOSCOPY WITH ENDOBRONCHIAL ULTRASOUND;  Surgeon: Tyler Pita, MD;  Location: [facility];  Service: Pulmonary;  Laterality: Right;    OB History   No obstetric history on file.      Home Medications    Prior to Admission medications   Medication Sig Start Date End Date Taking? Authorizing Provider  albuterol (VENTOLIN HFA) 108 (90 Base) MCG/ACT inhaler Inhale 2 puffs into the lungs every 6 (six) hours as needed for wheezing or shortness of breath.   Yes [provider]  azithromycin (ZITHROMAX Z-PAK)  250 MG tablet 2 today, then one qd x 4 days 08/06/21  Yes Rodriguez-Southworth, Sunday Spillers, PA-C  Bioflavonoid Products (ESTER C PO) Take 1 tablet by mouth 3 (three) times daily. 500 mg   Yes [provider]  Calcium-Magnesium-Zinc (CAL-MAG-ZINC PO) Take 1 tablet by mouth daily.   Yes [provider]  carboxymethylcellulose (REFRESH PLUS) 0.5 % SOLN Place 1 drop into both eyes 3 (three) times daily as needed (dry eyes).   Yes [provider]  cetirizine (ZYRTEC) 5 MG tablet Take 1 tablet (5 mg total) by mouth daily. 04/17/18  Yes Cook, Jayce G, DO  Cholecalciferol (EQL VITAMIN D3) 25 MCG (1000 UT) capsule Take 1,000 Units by mouth daily.   Yes [provider]  Coenzyme Q10 (CO Q 10) 100 MG CAPS Take 100 mg by mouth daily.    Yes [provider]  Cyanocobalamin (VITAMIN B-12) 2500 MCG  SUBL Place 2,500 mcg under the tongue daily.   Yes [provider]  cyclobenzaprine (FLEXERIL) 10 MG tablet Take 10 mg by mouth at bedtime.   Yes [provider]  dexamethasone (DECADRON) 4 MG tablet Take 1 tab every 12 hours the day before pemetrexed chemo, then take 2 tabs once a day for 3 days starting the day after carboplatin. 05/21/21  Yes Sindy Guadeloupe, MD  DHEA 25 MG CAPS Take 25 mg by mouth daily.   Yes [provider]  ezetimibe (ZETIA) 10 MG tablet Take 5 mg by mouth in the morning and at bedtime. 10/26/18  Yes [provider]  folic acid (FOLVITE) 1 MG tablet Take 1 tablet (1 mg total) by mouth daily. Start 7 days before pemetrexed chemotherapy. Continue until 21 days after pemetrexed completed. 05/21/21  Yes Sindy Guadeloupe, MD  folic acid (FOLVITE) 102 MCG tablet Take 400 mcg by mouth daily.   Yes [provider]  Garlic 7253 MG CAPS Take 1,000 mg by mouth daily.   Yes [provider]  Histamine Dihydrochloride (AUSTRALIAN DREAM ARTHRITIS) 0.025 % CREA Apply 1 application. topically 4 (four) times daily as needed (pain).   Yes [provider]  HYDROcodone-acetaminophen (NORCO/VICODIN) 5-325 MG tablet 1-2 tabs po bid prn Patient taking differently: Take 1 tablet by mouth at bedtime. 12/09/18  Yes Norval Gable, MD  levOCARNitine (L-CARNITINE) 250 MG TABS Take 1 tablet by mouth daily. With Chromium 250 mg   Yes [provider]  lidocaine-prilocaine (EMLA) cream Apply to affected area once 05/21/21  Yes Sindy Guadeloupe, MD  meclizine (ANTIVERT) 25 MG tablet Take 1 tablet (25 mg total) by mouth every 6 (six) hours as needed for dizziness. 06/11/19  Yes Sindy Guadeloupe, MD  ondansetron (ZOFRAN) 8 MG tablet Take 1 tablet (8 mg total) by mouth 2 (two) times daily as needed (Nausea or vomiting). Start if needed on the third day after carboplatin. 05/21/21  Yes Sindy Guadeloupe, MD  pantoprazole (PROTONIX) 40 MG tablet Take 40 mg by  mouth 2 (two) times daily.   Yes [provider]  polyethylene glycol powder (GLYCOLAX/MIRALAX) 17 GM/SCOOP powder Take 17 g by mouth daily as needed for moderate constipation.   Yes [provider]  prochlorperazine (COMPAZINE) 10 MG tablet Take 1 tablet (10 mg total) by mouth every 6 (six) hours as needed (Nausea or vomiting). 05/21/21  Yes Sindy Guadeloupe, MD  pyridOXINE (VITAMIN B-6) 100 MG tablet Take 100 mg by mouth daily.   Yes [provider]  Selenium 200 MCG CAPS  Take 200 mcg by mouth daily.   Yes [provider]  Sennosides (SENNA) 8.6 MG CAPS Take 1 capsule by mouth daily as needed (constipation).   Yes [provider]  sucralfate (CARAFATE) 1 GM/10ML suspension Take 1 g by mouth daily. 02/13/19  Yes [provider]  Tiotropium Bromide-Olodaterol (STIOLTO RESPIMAT) 2.5-2.5 MCG/ACT AERS Inhale 2 puffs into the lungs daily. 06/07/21  Yes Tyler Pita, MD  triamcinolone (NASACORT) 55 MCG/ACT AERO nasal inhaler Place 2 sprays into the nose daily.   Yes [provider]  UBRELVY 100 MG TABS Take 100 mg by mouth daily as needed (migraines). 01/30/20  Yes [provider]  Vitamin A 2400 MCG (8000 UT) CAPS Take 8,000 Units by mouth daily.   Yes [provider]  Vitamin E 200 units TABS Take 200 Units by mouth daily.   Yes [provider]    Family History Family History  Problem Relation Age of Onset   Heart disease Mother        s/p stent   Hypertension Mother    Hypercholesterolemia Mother    Diabetes Father    Stomach cancer Other        uncle   Breast cancer Cousin    Breast cancer Paternal Aunt     Social History Social History   Tobacco Use   Smoking status: Former    Packs/day: 1.00    Years: 40.00    Pack years: 40.00    Types: Cigarettes    Quit date: 06/13/2007    Years since quitting: 14.1   Smokeless tobacco: Never  Vaping Use   Vaping Use: Never used  Substance Use  Topics   Alcohol use: No    Alcohol/week: 0.0 standard drinks   Drug use: No     Allergies   Aspirin, Celebrex [celecoxib], Morphine and related, Adhesive [tape], Clarithromycin, Codeine, Darvon [propoxyphene hcl], Demerol [meperidine], Flonase [fluticasone propionate], Simvastatin, and Talwin [pentazocine]   Review of Systems Review of Systems  Constitutional:  Positive for chills and fever. Negative for appetite change and diaphoresis.       Has had low grade temp of less than 100  HENT:  Positive for postnasal drip and rhinorrhea. Negative for congestion.   Respiratory:  Positive for cough and wheezing. Negative for chest tightness and shortness of breath.   Cardiovascular:  Negative for chest pain.    Physical Exam Triage Vital Signs ED Triage Vitals  Enc Vitals Group     BP 08/06/21 1059 120/63     Pulse Rate 08/06/21 1059 94     Resp 08/06/21 1059 18     Temp 08/06/21 1059 98.6 F (37 C)     Temp Source 08/06/21 1059 Oral     SpO2 08/06/21 1059 98 %     Weight 08/06/21 1102 166 lb (75.3 kg)     Height 08/06/21 1102 5\' 1"  (1.549 m)     Head Circumference --      Peak Flow --      Pain Score 08/06/21 1101 10     Pain Loc --      Pain Edu? --      Excl. in [place]? --    No data found.  Updated Vital Signs BP 120/63 (BP Location: Left Arm)   Pulse 94   Temp 98.6 F (37 C) (Oral)   Resp 18   Ht 5\' 1"  (1.549 m)   Wt 166 lb (75.3 kg)   SpO2 98%  BMI 31.37 kg/m   Visual Acuity Right Eye Distance:   Left Eye Distance:   Bilateral Distance:    Right Eye Near:   Left Eye Near:    Bilateral Near:      Physical Exam Constitutional:      General: He is not in acute distress.    Appearance: He is not toxic-appearing.  HENT:     Head: Normocephalic.     Right Ear: Tympanic membrane, ear canal and external ear normal.     Left Ear: Ear canal and external ear normal.     Nose: Nose normal.     Mouth/Throat:     Mouth: Mucous membranes are moist.      Pharynx: Oropharynx is clear.  Eyes:     General: No scleral icterus.    Conjunctiva/sclera: Conjunctivae normal.  Cardiovascular:     Rate and Rhythm: Normal rate and regular rhythm.     Heart sounds: No murmur heard.   Pulmonary:     Effort: Pulmonary effort is normal. No respiratory distress.     Breath sounds: Wheezing present.     Comments: Has auditory wheezing Musculoskeletal:        General: Normal range of motion.     Cervical back: Neck supple.  Lymphadenopathy:     Cervical: No cervical adenopathy.  Skin:    General: Skin is warm and dry.     Findings: No rash.  Neurological:     Mental Status: He is alert and oriented to person, place, and time.     Gait: Gait normal.  Psychiatric:        Mood and Affect: Mood normal.        Behavior: Behavior normal.        Thought Content: Thought content normal.        Judgment: Judgment normal.    UC Treatments / Results  Labs (all labs ordered are listed, but only abnormal results are displayed) Labs Reviewed - No data to display  EKG   Radiology DG Chest 2 View  Result Date: 08/06/2021 CLINICAL DATA:  Hemoptysis EXAM: CHEST - 2 VIEW COMPARISON:  01/05/2021 FINDINGS: Improved left upper lobe aeration since prior. Mild opacification at the left suprahilar lung which is stable from 03/24/2021 CT. Porta catheter on the right with tip at the SVC. There is no edema, consolidation, effusion, or pneumothorax. Normal heart size and mediastinal contours. Eventration of the right diaphragm and left juxta phrenic peak. IMPRESSION: No evidence of active disease.  Stable from chest CT January 2023. Electronically Signed   By: Jorje Guild M.D.   On: 08/06/2021 11:38    Procedures Procedures (including critical care time)  Medications Ordered in UC Medications - No data to display  Initial Impression / Assessment and Plan / UC Course  I have reviewed the triage vital signs and the nursing notes.  Pertinent  imaging results  that were available during my care of the patient were reviewed by me and considered in my medical decision making (see chart for details).  Has acute bronchitis  I placed her on Zpack as noted. May continue with her nebs   Final Clinical Impressions(s) / UC Diagnoses   Final diagnoses:  Bronchitis   Discharge Instructions   None    ED Prescriptions     Medication Sig Dispense Auth. Provider   azithromycin (ZITHROMAX Z-PAK) 250 MG tablet 2 today, then one qd x 4 days 6 tablet Rodriguez-Southworth, Sunday Spillers, PA-C  PDMP not reviewed this encounter.   Shelby Mattocks, PA-C 08/06/21 1215

## 2021-08-06 NOTE — Telephone Encounter (Signed)
Patient called reporting that she was seen at Urgent Care in [place] this morning and was given a Zpak for Acute Bronchitis. Since then, she has not felt good, her b/p is up 139-89 and her pulse rate is up to 143 and she feels as if she is going to pass out. She also reports that last night, she had severe pain in her chest to her shoulder which prompted her visit to Urgent Care today. She states she tried calling the Urgent Care and her PCP of which both were closed for the weekend. I advised her that it is too late in the day for our office to assist her with this and that she needs to go to ER for evaluation or to a different Urgent Care center perhaps [facility] walk in clinic . I stressed that she needs to be checked to be sure that she is not going in and out of Af[facility] and she is in agreement with going to be seen either at [facility] or if they are not open going to the ER.

## 2021-08-06 NOTE — ED Triage Notes (Addendum)
Pt presents to ED with c/o of "possibly having a PE". Pt states seen at a walk in clinic at 1030 this morning. Pt states she was given ABX at clinic. Pt states she is a cancer pt and reports something about not able to get chemo meds and they are trying to do a combination medication instead. Pt states walk in clinic gave a z-pack for bronchitis.  Pt endorses chest pain that started last night at 2200.

## 2021-08-10 ENCOUNTER — Encounter: Payer: Self-pay | Admitting: Oncology

## 2021-08-11 ENCOUNTER — Ambulatory Visit
Admission: RE | Admit: 2021-08-11 | Discharge: 2021-08-11 | Disposition: A | Discharge status: Home / Self Care | Payer: Medicare Other | Source: Ambulatory Visit | Attending: Oncology | Admitting: Oncology

## 2021-08-11 DIAGNOSIS — C3490 Malignant neoplasm of unspecified part of unspecified bronchus or lung: Secondary | ICD-10-CM | POA: Insufficient documentation

## 2021-08-11 MED ORDER — IOHEXOL 300 MG/ML  SOLN
100.0000 mL | Freq: Once | INTRAMUSCULAR | Status: AC | PRN
  Administered 2021-08-11: 100 mL via INTRAVENOUS

## 2021-08-24 MED FILL — Dexamethasone Sodium Phosphate Inj 100 MG/10ML: INTRAMUSCULAR | Qty: 1 | Status: AC

## 2021-08-25 ENCOUNTER — Inpatient Hospital Stay (HOSPITAL_BASED_OUTPATIENT_CLINIC_OR_DEPARTMENT_OTHER): Payer: Medicare Other | Admitting: Oncology

## 2021-08-25 ENCOUNTER — Ambulatory Visit
Admission: RE | Admit: 2021-08-25 | Discharge: 2021-08-25 | Disposition: A | Discharge status: Home / Self Care | Payer: Medicare Other | Source: Ambulatory Visit | Attending: Radiation Oncology | Admitting: Radiation Oncology

## 2021-08-25 ENCOUNTER — Inpatient Hospital Stay: Payer: Medicare Other

## 2021-08-25 ENCOUNTER — Inpatient Hospital Stay: Payer: Medicare Other | Attending: Oncology

## 2021-08-25 ENCOUNTER — Encounter: Payer: Self-pay | Admitting: Oncology

## 2021-08-25 VITALS — BP 111/72 | HR 91 | Temp 98.4°F | Resp 18 | Wt 167.0 lb

## 2021-08-25 DIAGNOSIS — Z5111 Encounter for antineoplastic chemotherapy: Secondary | ICD-10-CM | POA: Insufficient documentation

## 2021-08-25 DIAGNOSIS — C3411 Malignant neoplasm of upper lobe, right bronchus or lung: Secondary | ICD-10-CM | POA: Insufficient documentation

## 2021-08-25 DIAGNOSIS — C3412 Malignant neoplasm of upper lobe, left bronchus or lung: Secondary | ICD-10-CM

## 2021-08-25 DIAGNOSIS — Z9221 Personal history of antineoplastic chemotherapy: Secondary | ICD-10-CM | POA: Insufficient documentation

## 2021-08-25 DIAGNOSIS — Z923 Personal history of irradiation: Secondary | ICD-10-CM | POA: Insufficient documentation

## 2021-08-25 DIAGNOSIS — C3490 Malignant neoplasm of unspecified part of unspecified bronchus or lung: Secondary | ICD-10-CM

## 2021-08-25 DIAGNOSIS — D649 Anemia, unspecified: Secondary | ICD-10-CM

## 2021-08-25 LAB — COMPREHENSIVE METABOLIC PANEL
ALT: 19 U/L (ref 0–44)
AST: 25 U/L (ref 15–41)
Albumin: 3.8 g/dL (ref 3.5–5.0)
Alkaline Phosphatase: 80 U/L (ref 38–126)
Anion gap: 7 (ref 5–15)
BUN: 15 mg/dL (ref 8–23)
CO2: 26 mmol/L (ref 22–32)
Calcium: 8.9 mg/dL (ref 8.9–10.3)
Chloride: 106 mmol/L (ref 98–111)
Creatinine, Ser: 0.53 mg/dL (ref 0.44–1.00)
GFR, Estimated: >60 mL/min (ref >60)
Glucose, Bld: 94 mg/dL (ref 70–99)
Potassium: 3.9 mmol/L (ref 3.5–5.1)
Sodium: 139 mmol/L (ref 135–145)
Total Bilirubin: 0.4 mg/dL (ref 0.3–1.2)
Total Protein: 6.2 g/dL — ABNORMAL LOW (ref 6.5–8.1)

## 2021-08-25 LAB — CBC WITH DIFFERENTIAL/PLATELET
Abs Immature Granulocytes: 0.02 10*3/uL (ref 0.00–0.07)
Basophils Absolute: 0 10*3/uL (ref 0.0–0.1)
Basophils Relative: 0 %
Eosinophils Absolute: 0.1 10*3/uL (ref 0.0–0.5)
Eosinophils Relative: 2 %
HCT: 34.1 % — ABNORMAL LOW (ref 36.0–46.0)
Hemoglobin: 11.2 g/dL — ABNORMAL LOW (ref 12.0–15.0)
Immature Granulocytes: 1 %
Lymphocytes Relative: 14 %
Lymphs Abs: 0.6 10*3/uL — ABNORMAL LOW (ref 0.7–4.0)
MCH: 33.2 pg (ref 26.0–34.0)
MCHC: 32.8 g/dL (ref 30.0–36.0)
MCV: 101.2 fL — ABNORMAL HIGH (ref 80.0–100.0)
Monocytes Absolute: 0.4 10*3/uL (ref 0.1–1.0)
Monocytes Relative: 11 %
Neutro Abs: 2.7 10*3/uL (ref 1.7–7.7)
Neutrophils Relative %: 72 %
Platelets: 207 10*3/uL (ref 150–400)
RBC: 3.37 MIL/uL — ABNORMAL LOW (ref 3.87–5.11)
RDW: 15.9 % — ABNORMAL HIGH (ref 11.5–15.5)
WBC: 3.8 10*3/uL — ABNORMAL LOW (ref 4.0–10.5)
nRBC: 0 % (ref 0.0–0.2)

## 2021-08-25 MED ORDER — HEPARIN SOD (PORK) LOCK FLUSH 100 UNIT/ML IV SOLN
INTRAVENOUS | Status: AC
  Administered 2021-08-25: 500 [IU]
  Filled 2021-08-25: qty 5

## 2021-08-25 MED ORDER — CYANOCOBALAMIN 1000 MCG/ML IJ SOLN
1000.0000 ug | Freq: Once | INTRAMUSCULAR | Status: AC
  Administered 2021-08-25: 1000 ug via INTRAMUSCULAR
  Filled 2021-08-25: qty 1

## 2021-08-25 MED ORDER — SODIUM CHLORIDE 0.9 % IV SOLN
10.0000 mg | Freq: Once | INTRAVENOUS | Status: AC
  Administered 2021-08-25: 10 mg via INTRAVENOUS
  Filled 2021-08-25: qty 10

## 2021-08-25 MED ORDER — SODIUM CHLORIDE 0.9 % IV SOLN
Freq: Once | INTRAVENOUS | Status: AC
  Filled 2021-08-25: qty 250

## 2021-08-25 MED ORDER — SODIUM CHLORIDE 0.9 % IV SOLN
500.0000 mg/m2 | Freq: Once | INTRAVENOUS | Status: AC
  Administered 2021-08-25: 900 mg via INTRAVENOUS
  Filled 2021-08-25: qty 36

## 2021-08-25 MED ORDER — HEPARIN SOD (PORK) LOCK FLUSH 100 UNIT/ML IV SOLN
500.0000 [IU] | Freq: Once | INTRAVENOUS | Status: AC | PRN
  Filled 2021-08-25: qty 5

## 2021-08-25 NOTE — Progress Notes (Signed)
Pt states that she is starting to have pain that she has not had before. Usually, most painful place is in the middle of her back and radiates to her chest. Urgent care advised she had bronchitis and prescribed azirthomycin but did not seem to help. Was advised to go back to urgent care and this time was told it might have been a blood clot but tests were negative for blood clots.

## 2021-08-25 NOTE — Progress Notes (Signed)
Radiation Oncology Follow up Note  Name: Jennifer Hodges   Date:   08/25/2021 MRN:  [id number] DOB: 09-16-1945    This 76 y.o. female presents to the clinic today for 1 month follow-up status post concurrent chemoradiation therapy for.  Right hilar recurrentAdenocarcinoma lung stage III   REFERRING PROVIDER: [name], MD  HPI: Patient is a 76 year old female previously treated over 2-1/2 years prior with concurrent chemoradiation therapy for stage III.  Adenocarcinoma the left lung.  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  She is seen today in routine follow-up is doing well specifically [place] productive cough hemoptysis or chest tightness.  Her p.o. intake is good no dysphagia.  She had a recent CT scan showing unchanged posttreatment radiation changes in the perihilar left lung.  Right sided mass which was hypermetabolic is diminished in size compared to most recent CT scan consistent with treatment response no persistent enlarged mediastinal hilar or axillary lymph nodes were noted.  COMPLICATIONS OF TREATMENT: none  FOLLOW UP COMPLIANCE: keeps appointments   PHYSICAL EXAM:  There were no vitals taken for this visit. Well-developed well-nourished patient in NAD. HEENT reveals PERLA, EOMI, discs not visualized.  Oral cavity is clear. No oral mucosal lesions are identified. Neck is clear without evidence of cervical or supraclavicular adenopathy. Lungs are clear to A&P. Cardiac examination is essentially unremarkable with regular rate and rhythm without murmur rub or thrill. Abdomen is benign with no organomegaly or masses noted. Motor sensory and DTR levels are equal and symmetric in the upper and lower extremities. Cranial nerves II through XII are grossly intact. Proprioception is intact. No peripheral adenopathy or edema is identified. No motor  or sensory levels are noted. Crude visual fields are within normal range.  RADIOLOGY RESULTS: CT scan reviewed compatible with above-stated findings  PLAN: Present time patient is stable doing well.  She continues close follow-up care by medical oncology.  I have asked to see her in 4 months for follow-up.  We will review any CT scans that become available.  Patient comprehends her recommendations well.  I would like to take this opportunity to thank you for allowing me to participate in the care of your patient.Noreene Filbert, MD

## 2021-08-25 NOTE — Patient Instructions (Signed)
[facility]  Discharge Instructions: Thank you for choosing [facility] to provide your oncology and hematology care.  If you have a lab appointment with the Cancer Center, please go directly to the Cancer Center and check in at the registration area.  Wear comfortable clothing and clothing appropriate for easy access to any Portacath or PICC line.   We strive to give you quality time with your provider. You may need to reschedule your appointment if you arrive late (15 or more minutes).  Arriving late affects you and other patients whose appointments are after yours.  Also, if you miss three or more appointments without notifying the office, you may be dismissed from the clinic at the provider's discretion.      For prescription refill requests, have your pharmacy contact our office and allow 72 hours for refills to be completed.    Today you received the following chemotherapy and/or immunotherapy agents: Alimta      To help prevent nausea and vomiting after your treatment, we encourage you to take your nausea medication as directed.  BELOW ARE SYMPTOMS THAT SHOULD BE REPORTED IMMEDIATELY: *FEVER GREATER THAN 100.4 F (38 C) OR HIGHER *CHILLS OR SWEATING *NAUSEA AND VOMITING THAT IS NOT CONTROLLED WITH YOUR NAUSEA MEDICATION *UNUSUAL SHORTNESS OF BREATH *UNUSUAL BRUISING OR BLEEDING *URINARY PROBLEMS (pain or burning when urinating, or frequent urination) *BOWEL PROBLEMS (unusual diarrhea, constipation, pain near the anus) TENDERNESS IN MOUTH AND THROAT WITH OR WITHOUT PRESENCE OF ULCERS (sore throat, sores in mouth, or a toothache) UNUSUAL RASH, SWELLING OR PAIN  UNUSUAL VAGINAL DISCHARGE OR ITCHING   Items with * indicate a potential emergency and should be followed up as soon as possible or go to the Emergency Department if any problems should occur.  Please show the CHEMOTHERAPY ALERT CARD or IMMUNOTHERAPY ALERT CARD at check-in to the  Emergency Department and triage nurse.  Should you have questions after your visit or need to cancel or reschedule your appointment, please contact [facility]  [phone] and follow the prompts.  Office hours are 8:00 a.m. to 4:30 p.m. Monday - Friday. Please note that voicemails left after 4:00 p.m. may not be returned until the following business day.  We are closed weekends and major holidays. You have access to a nurse at all times for urgent questions. Please call the main number to the clinic [phone] and follow the prompts.  For any non-urgent questions, you may also contact your provider using MyChart. We now offer e-Visits for anyone 18 and older to request care online for non-urgent symptoms. For details visit [email or web address].   Also download the MyChart app! Go to the app store, search "MyChart", open the app, select [facility], and log in with your MyChart username and password.  Masks are optional in the cancer centers. If you would like for your care team to wear a mask while they are taking care of you, please let them know. For doctor visits, patients may have with them one support person who is at least 77 years old. At this time, visitors are not allowed in the infusion area.   

## 2021-08-25 NOTE — Progress Notes (Signed)
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  EGFR and Ros as well as MET testing negative. PD-L1 was 50%   Patient completed concurrent chemoradiation with weekly carbotaxol chemotherapy on 03/12/2019.  Maintenance durvalumab started in January 2021 after scan showed partial response.  Patient completed 1 year of maintenance durvalumab in February 2022   Patient was found to have enlarging mediastinal adenopathyOn subsequent CT scan in January 2023.  This was followed by a PET scan which showed hypermetabolic mediastinal and right hilar lymph nodes but no other evidence of disease elsewhere.  This was biopsied and was consistent with non-small cell lung cancer specifically adenocarcinoma.  Cells were positive for TTF-1 and Napsin A and negative for p40.   Patient completed concurrent chemoradiation with 4 cycles of carbo Alimta ending in May 2023.  Scan showed partial response.  Plan is for maintenance Alimta until progression or toxicity   Interval history-patient continues to have recurrent episodes of bronchitis.  Presently she is on a course of  doxycycline.  Cough is overall better.  Denies any fever.  She has baseline exertional shortness of breath which is stable.  ECOG PS- 1 Pain scale- 0   Review of systems- Review of Systems  Constitutional:  Positive for malaise/fatigue. Negative for chills, fever and weight loss.  HENT:  Negative for congestion, ear discharge and nosebleeds.   Eyes:  Negative for blurred vision.  Respiratory:  Positive for shortness of breath. Negative for cough, hemoptysis, sputum production and wheezing.   Cardiovascular:  Negative for chest pain, palpitations, orthopnea and claudication.  Gastrointestinal:  Negative for abdominal pain, blood in stool, constipation, diarrhea, heartburn, melena, nausea and vomiting.  Genitourinary:  Negative for dysuria, flank pain, frequency, hematuria and urgency.  Musculoskeletal:  Negative for back pain, joint pain and myalgias.  Skin:  Negative for rash.  Neurological:  Negative for dizziness, tingling, focal weakness, seizures, weakness and headaches.  Endo/Heme/Allergies:  Does not bruise/bleed easily.  Psychiatric/Behavioral:  Negative for depression and suicidal ideas. The patient does not have insomnia.        Allergies  Allergen Reactions   Aspirin Hives, Shortness Of Breath and Other (See Comments)    Difficulty breathing   Celebrex [Celecoxib] Shortness Of Breath   Morphine And Related Nausea And Vomiting and Swelling   Adhesive [Tape] Other (See Comments)    Took top layer of skin off when removed.   Clarithromycin Nausea And Vomiting   Codeine Nausea And Vomiting   Darvon [Propoxyphene Hcl] Nausea And Vomiting   Demerol [Meperidine] Nausea And Vomiting   Flonase [Fluticasone Propionate] Other (See Comments)    Fungal infection in nose   Simvastatin Other (See Comments)    "caused ulcers  in mouth, and fever"   Talwin [Pentazocine] Nausea And Vomiting     Past Medical History:  Diagnosis Date   Anemia    Asthma    No Inhalers--Dr. [name]  will order as needed   Bronchiectasis ([place])    mild   Chronic headaches     followed by [facility] migraines   COPD (chronic obstructive pulmonary disease) (HCC)    DDD (degenerative disc disease), lumbar    Diverticulosis    Family history of adverse reaction to anesthesia    mother and sisters-PONV   Gall stones    history of   GERD (gastroesophageal reflux disease)    EGD 8/09- non bleeding erosive gastritis, documentd esophageal ulcerations.    Hiatal hernia    small   History of kidney stones    History of pneumonia    Hypercholesterolemia    IBS (irritable bowel syndrome)    Malignant neoplasm of upper lobe of left lung ([place]) 12/27/2018   Meniere disease    Murmur    Osteoarthritis    lumbar disc disease, left hip   Personal history of chemotherapy    Personal history of radiation therapy    Pneumonia 12/2020   PONV (postoperative nausea and vomiting)    Sleep apnea    uses cpap   Vertigo    Weakness of right side of body      Past Surgical History:  Procedure Laterality Date   ABDOMINAL HYSTERECTOMY  age 23   ANTERIOR CERVICAL DECOMP/DISCECTOMY FUSION N/A 02/24/2015   Procedure: CERVICAL FOUR-FIVE, CERVICAL FIVE-SIX, CERVICAL SIX-SEVEN ANTERIOR CERVICAL DECOMPRESSION/DISCECTOMY FUSION ;  Surgeon: [name], MD;  Location: [place] [facility];  Service: Neurosurgery;  Laterality: N/A;  C45 C56 C67 anterior cervical decompression with fusion interbody prosthesis plating and bonegraft   APPENDECTOMY     BACK SURGERY  1988   4th lumbar fusion   BLADDER SURGERY N/A    with vaginal wall repair   BREAST CYST ASPIRATION Bilateral    neg   BREAST SURGERY Bilateral    cyst removed and reduction   CARDIAC CATHETERIZATION  2014   CARPAL TUNNEL RELEASE Right 02/11/2016   Procedure: CARPAL TUNNEL RELEASE;  Surgeon: [name], MD;  Location: [facility];  Service: Orthopedics;  Laterality: Right;   CATARACT EXTRACTION W/ INTRAOCULAR LENS IMPLANT Bilateral 2015    CHOLECYSTECTOMY     EUS N/A 05/31/2012   Procedure: UPPER ENDOSCOPIC ULTRASOUND (EUS) LINEAR;  Surgeon: [name], MD;  Location: [facility];  Service: Endoscopy;  Laterality: N/A;   EXCISIONAL HEMORRHOIDECTOMY     JOINT REPLACEMENT Bilateral    KNEE ARTHROSCOPY WITH LATERAL MENISECTOMY Right 07/07/2015   Procedure: KNEE ARTHROSCOPY WITH LATERAL MENISECTOMY, PARTIAL SYNOVECTOMY;  Surgeon: [name], MD;  Location: [facility];  Service: Orthopedics;  Laterality: Right;   LUMBAR LAMINECTOMY     PORTA CATH INSERTION N/A 01/07/2019   Procedure: PORTA CATH INSERTION;  Surgeon: [name], MD;  Location: [place];  Service: Cardiovascular;  Laterality: N/A;   REDUCTION MAMMAPLASTY  1990   RIGHT OOPHORECTOMY     TOTAL HIP ARTHROPLASTY Left 05/01/2014   Dr. [name]   TOTAL HIP ARTHROPLASTY Right 08/05/2014   Procedure: TOTAL HIP ARTHROPLASTY ANTERIOR APPROACH;  Surgeon: [name], MD;  Location: [facility];  Service: Orthopedics;  Laterality: Right;   ULNAR NERVE TRANSPOSITION Right 02/11/2016   Procedure: ULNAR NERVE DECOMPRESSION/TRANSPOSITION;  Surgeon: [name], MD;  Location: [facility];  Service: Orthopedics;  Laterality:  Right;   VIDEO BRONCHOSCOPY WITH ENDOBRONCHIAL ULTRASOUND Left 12/19/2018   Procedure: VIDEO BRONCHOSCOPY WITH ENDOBRONCHIAL ULTRASOUND, LEFT, SLEEP APNEA;  Surgeon: [name], MD;  Location: [facility];  Service: Thoracic;  Laterality: Left;   VIDEO BRONCHOSCOPY WITH ENDOBRONCHIAL ULTRASOUND Right 05/14/2021   Procedure: VIDEO BRONCHOSCOPY WITH ENDOBRONCHIAL ULTRASOUND;  Surgeon: Tyler Pita, MD;  Location: [facility];  Service: Pulmonary;  Laterality: Right;    Social History   Socioeconomic History   Marital status: Married    Spouse name: Not on file   Number of children: Not on file   Years of education: Not on file   Highest education level: Not on file  Occupational History   Not on file  Tobacco Use   Smoking status:  Former    Packs/day: 1.00    Years: 40.00    Total pack years: 40.00    Types: Cigarettes    Quit date: 06/13/2007    Years since quitting: 14.2   Smokeless tobacco: Never  Vaping Use   Vaping Use: Never used  Substance and Sexual Activity   Alcohol use: No    Alcohol/week: 0.0 standard drinks of alcohol   Drug use: No   Sexual activity: Not on file  Other Topics Concern   Not on file  Social History Narrative   Not on file   Social Determinants of Health   Financial Resource Strain: Not on file  Food Insecurity: Not on file  Transportation Needs: Not on file  Physical Activity: Not on file  Stress: Not on file  Social Connections: Not on file  Intimate Partner Violence: Not on file    Family History  Problem Relation Age of Onset   Heart disease Mother        s/p stent   Hypertension Mother    Hypercholesterolemia Mother    Diabetes Father    Stomach cancer Other        uncle   Breast cancer Cousin    Breast cancer Paternal Aunt      Current Outpatient Medications:    albuterol (VENTOLIN HFA) 108 (90 Base) MCG/ACT inhaler, Inhale 2 puffs into the lungs every 6 (six) hours as needed for wheezing or shortness of breath., Disp: , Rfl:    Bioflavonoid Products (ESTER C PO), Take 1 tablet by mouth 3 (three) times daily. 500 mg, Disp: , Rfl:    Calcium-Magnesium-Zinc (CAL-MAG-ZINC PO), Take 1 tablet by mouth daily., Disp: , Rfl:    carboxymethylcellulose (REFRESH PLUS) 0.5 % SOLN, Place 1 drop into both eyes 3 (three) times daily as needed (dry eyes)., Disp: , Rfl:    cetirizine (ZYRTEC) 5 MG tablet, Take 1 tablet (5 mg total) by mouth daily., Disp: 30 tablet, Rfl: 0   Cholecalciferol (EQL VITAMIN D3) 25 MCG (1000 UT) capsule, Take 1,000 Units by mouth daily., Disp: , Rfl:    Coenzyme Q10 (CO Q 10) 100 MG CAPS, Take 100 mg by mouth daily. , Disp: , Rfl:    Cyanocobalamin (VITAMIN B-12) 2500 MCG SUBL, Place 2,500 mcg under the tongue daily., Disp: , Rfl:     cyclobenzaprine (FLEXERIL) 10 MG tablet, Take 10 mg by mouth at bedtime., Disp: , Rfl:    dexamethasone (DECADRON) 4 MG tablet, Take 1 tab every 12 hours the day before pemetrexed chemo, then take 2 tabs once a day for 3 days starting the day after carboplatin., Disp: 30 tablet, Rfl: 1   DHEA 25 MG CAPS, Take 25 mg by mouth daily.,  Disp: , Rfl:    doxycycline (VIBRAMYCIN) 100 MG capsule, Take 100 mg by mouth 2 (two) times daily., Disp: , Rfl:    ezetimibe (ZETIA) 10 MG tablet, Take 5 mg by mouth in the morning and at bedtime., Disp: , Rfl:    folic acid (FOLVITE) 1 MG tablet, Take 1 tablet (1 mg total) by mouth daily. Start 7 days before pemetrexed chemotherapy. Continue until 21 days after pemetrexed completed., Disp: 100 tablet, Rfl: 3   folic acid (FOLVITE) 563 MCG tablet, Take 400 mcg by mouth daily., Disp: , Rfl:    Garlic 1497 MG CAPS, Take 1,000 mg by mouth daily., Disp: , Rfl:    Histamine Dihydrochloride (AUSTRALIAN DREAM ARTHRITIS) 0.025 % CREA, Apply 1 application. topically 4 (four) times daily as needed (pain)., Disp: , Rfl:    HYDROcodone-acetaminophen (NORCO/VICODIN) 5-325 MG tablet, 1-2 tabs po bid prn (Patient taking differently: Take 1 tablet by mouth at bedtime.), Disp: 10 tablet, Rfl: 0   levOCARNitine (L-CARNITINE) 250 MG TABS, Take 1 tablet by mouth daily. With Chromium 250 mg, Disp: , Rfl:    lidocaine-prilocaine (EMLA) cream, Apply to affected area once, Disp: 30 g, Rfl: 3   meclizine (ANTIVERT) 25 MG tablet, Take 1 tablet (25 mg total) by mouth every 6 (six) hours as needed for dizziness., Disp: 30 tablet, Rfl: 0   ondansetron (ZOFRAN) 8 MG tablet, Take 1 tablet (8 mg total) by mouth 2 (two) times daily as needed (Nausea or vomiting). Start if needed on the third day after carboplatin., Disp: 30 tablet, Rfl: 1   pantoprazole (PROTONIX) 40 MG tablet, Take 40 mg by mouth 2 (two) times daily., Disp: , Rfl:    polyethylene glycol powder (GLYCOLAX/MIRALAX) 17 GM/SCOOP powder, Take  17 g by mouth daily as needed for moderate constipation., Disp: , Rfl:    predniSONE (DELTASONE) 20 MG tablet, Take 60 mg by mouth daily., Disp: , Rfl:    prochlorperazine (COMPAZINE) 10 MG tablet, Take 1 tablet (10 mg total) by mouth every 6 (six) hours as needed (Nausea or vomiting)., Disp: 30 tablet, Rfl: 1   pyridOXINE (VITAMIN B-6) 100 MG tablet, Take 100 mg by mouth daily., Disp: , Rfl:    Selenium 200 MCG CAPS, Take 200 mcg by mouth daily., Disp: , Rfl:    Sennosides (SENNA) 8.6 MG CAPS, Take 1 capsule by mouth daily as needed (constipation)., Disp: , Rfl:    sucralfate (CARAFATE) 1 GM/10ML suspension, Take 1 g by mouth daily., Disp: , Rfl:    Tiotropium Bromide-Olodaterol (STIOLTO RESPIMAT) 2.5-2.5 MCG/ACT AERS, Inhale 2 puffs into the lungs daily., Disp: 4 g, Rfl: 10   triamcinolone (NASACORT) 55 MCG/ACT AERO nasal inhaler, Place 2 sprays into the nose daily., Disp: , Rfl:    UBRELVY 100 MG TABS, Take 100 mg by mouth daily as needed (migraines)., Disp: , Rfl:    Vitamin A 2400 MCG (8000 UT) CAPS, Take 8,000 Units by mouth daily., Disp: , Rfl:    Vitamin E 200 units TABS, Take 200 Units by mouth daily., Disp: , Rfl:  No current facility-administered medications for this visit.  Facility-Administered Medications Ordered in Other Visits:    0.9 %  sodium chloride infusion, , Intravenous, Once, Sindy Guadeloupe, MD   cyanocobalamin ((VITAMIN B-12)) injection 1,000 mcg, 1,000 mcg, Intramuscular, Once, Burns, Anderson Malta E, NP   dexamethasone (DECADRON) 10 mg in sodium chloride 0.9 % 50 mL IVPB, 10 mg, Intravenous, Once, Sindy Guadeloupe, MD   heparin lock flush  100 unit/mL, 500 Units, Intracatheter, Once PRN, Sindy Guadeloupe, MD   PEMEtrexed (ALIMTA) 900 mg in sodium chloride 0.9 % 100 mL chemo infusion, 500 mg/m2 (Treatment Plan Recorded), Intravenous, Once, Sindy Guadeloupe, MD  Physical exam:  Vitals:   08/25/21 0852  BP: 111/72  Pulse: 91  Resp: 18  Temp: 98.4 F (36.9 C)  SpO2: 98%   Weight: 167 lb (75.8 kg)   Physical Exam Constitutional:      General: She is not in acute distress. Cardiovascular:     Rate and Rhythm: Normal rate and regular rhythm.     Heart sounds: Normal heart sounds.  Pulmonary:     Effort: Pulmonary effort is normal.     Breath sounds: Normal breath sounds.  Abdominal:     General: Bowel sounds are normal.     Palpations: Abdomen is soft.  Skin:    General: Skin is warm and dry.  Neurological:     Mental Status: She is alert and oriented to person, place, and time.         Latest Ref Rng & Units 08/25/2021    8:37 AM  CMP  Glucose 70 - 99 mg/dL 94   BUN 8 - 23 mg/dL 15   Creatinine 0.44 - 1.00 mg/dL 0.53   Sodium 135 - 145 mmol/L 139   Potassium 3.5 - 5.1 mmol/L 3.9   Chloride 98 - 111 mmol/L 106   CO2 22 - 32 mmol/L 26   Calcium 8.9 - 10.3 mg/dL 8.9   Total Protein 6.5 - 8.1 g/dL 6.2   Total Bilirubin 0.3 - 1.2 mg/dL 0.4   Alkaline Phos 38 - 126 U/L 80   AST 15 - 41 U/L 25   ALT 0 - 44 U/L 19       Latest Ref Rng & Units 08/25/2021    8:37 AM  CBC  WBC 4.0 - 10.5 K/uL 3.8   Hemoglobin 12.0 - 15.0 g/dL 11.2   Hematocrit 36.0 - 46.0 % 34.1   Platelets 150 - 400 K/uL 207     No images are attached to the encounter.  CT CHEST ABDOMEN PELVIS W CONTRAST  Result Date: 08/11/2021 CLINICAL DATA:  Metastatic lung cancer restaging, status post chemotherapy and radiation * Tracking Code: BO * EXAM: CT CHEST, ABDOMEN, AND PELVIS WITH CONTRAST TECHNIQUE: Multidetector CT imaging of the chest, abdomen and pelvis was performed following the standard protocol during bolus administration of intravenous contrast. RADIATION DOSE REDUCTION: This exam was performed according to the departmental dose-optimization program which includes automated exposure control, adjustment of the mA and/or kV according to patient size and/or use of iterative reconstruction technique. CONTRAST:  13mL OMNIPAQUE IOHEXOL 300 MG/ML SOLN, additional oral enteric  contrast COMPARISON:  CT chest angiogram, 08/06/2021, PET-CT, 04/12/2021, CT chest abdomen pelvis, 03/24/2021 FINDINGS: CT CHEST FINDINGS Cardiovascular: Right chest port catheter. Aortic atherosclerosis. Normal heart size. Three-vessel coronary artery calcifications. No pericardial effusion. Mediastinum/Nodes: Mediastinal lymph nodes are unchanged compared to very recent prior examination of the chest dated 08/06/2021, and as previously reported are diminished in size compared to prior staging CT and PET-CT. Unchanged post treatment appearance of soft tissue about the left hilum (series 2, image 28). No persistently enlarged mediastinal, hilar, or axillary lymph nodes. Thyroid gland, trachea, and esophagus demonstrate no significant findings. Lungs/Pleura: Mild centrilobular emphysema. Unchanged post treatment/post radiation appearance of the perihilar left lung, with bandlike fibrosis and bronchiectasis. Clustered nodularity in the dependent right lower lobe is similar to  prior examination, nonspecific and infectious or inflammatory (series 4, image 97). Unchanged 0.4 cm nodule of the paramedian right lower lobe (series 4, image 110). No pleural effusion or pneumothorax. Musculoskeletal: No chest wall mass or suspicious osseous lesions identified. CT ABDOMEN PELVIS FINDINGS Hepatobiliary: No focal liver abnormality is seen. Status post cholecystectomy. Postoperative biliary dilatation. Pancreas: Unremarkable. No pancreatic ductal dilatation or surrounding inflammatory changes. Spleen: Normal in size without significant abnormality. Adrenals/Urinary Tract: Adrenal glands are unremarkable. Kidneys are normal, without renal calculi, solid lesion, or hydronephrosis. Very limited evaluation of the bladder secondary to dense metallic streak artifact from bilateral hip arthroplasty. Stomach/Bowel: Stomach is within normal limits. Appendix is not clearly visualized and may be surgically absent. No evidence of bowel wall  thickening, distention, or inflammatory changes. Sigmoid diverticula. Vascular/Lymphatic: Aortic atherosclerosis. No enlarged abdominal or pelvic lymph nodes. Reproductive: Dense metallic streak artifact from bilateral hip arthroplasty significantly limits evaluation of the low pelvis. Status post hysterectomy. Other: No abdominal wall hernia or abnormality. No ascites. Musculoskeletal: No acute osseous findings. Status post bilateral hip arthroplasty. IMPRESSION: 1. Unchanged post treatment/post radiation appearance of the perihilar left lung, with bandlike fibrosis and bronchiectasis. 2. As previously reported, although unchanged from recent prior CT angiogram of the chest, enlarged and FDG avid mediastinal lymph nodes are diminished in size compared to most recent staging CT and PET-CT, consistent with treatment response of metastatic lymphadenopathy. No persistently enlarged mediastinal, hilar, or axillary lymph nodes. Unchanged post treatment appearance of soft tissue about the left hilum. 3. Unchanged 0.4 cm nodule of the paramedian right lower lobe, nonspecific. Attention on follow-up. 4. No evidence of metastatic disease within the abdomen or pelvis. 5. Emphysema. 6. Coronary artery disease. Aortic Atherosclerosis (ICD10-I70.0) and Emphysema (ICD10-J43.9). Electronically Signed   By: Delanna Ahmadi M.D.   On: 08/11/2021 14:00   CT Angio Chest PE W and/or Wo Contrast  Result Date: 08/06/2021 CLINICAL DATA:  Cough, hemoptysis, nausea, congestion for 5 weeks, history of lung cancer EXAM: CT ANGIOGRAPHY CHEST WITH CONTRAST TECHNIQUE: Multidetector CT imaging of the chest was performed using the standard protocol during bolus administration of intravenous contrast. Multiplanar CT image reconstructions and MIPs were obtained to evaluate the vascular anatomy. RADIATION DOSE REDUCTION: This exam was performed according to the departmental dose-optimization program which includes automated exposure control,  adjustment of the mA and/or kV according to patient size and/or use of iterative reconstruction technique. CONTRAST:  77mL OMNIPAQUE IOHEXOL 350 MG/ML SOLN COMPARISON:  08/06/2021, 03/24/2021 FINDINGS: Cardiovascular: This is a technically adequate evaluation of the pulmonary vasculature. No filling defects or pulmonary emboli. The heart is unremarkable without pericardial effusion. No evidence of thoracic aortic aneurysm or dissection. Stable atherosclerosis of the aorta and coronary vasculature. Stable right chest wall port. Mediastinum/Nodes: No pathologic adenopathy within the mediastinum, hila, or axillary regions. 0.8 cm right hilar lymph node is noted, previously having measured 1.4 cm. Thyroid, trachea, and esophagus are grossly unremarkable. There is a small hiatal hernia. Lungs/Pleura: Stable left parahilar consolidation consistent with post radiation change. Mild background emphysema unchanged. Subsegmental atelectasis right lower lobe. No acute airspace disease, effusion, or pneumothorax. There is stable left upper lobe bronchiectasis. Bronchial wall thickening and partial mucous plugging within the lower lobe bronchi unchanged. Stable 5 mm subpleural right lower lobe pulmonary nodule reference image 88/5. Upper Abdomen: No acute abnormality. Musculoskeletal: No acute or destructive bony lesions. Reconstructed images demonstrate no additional findings. Review of the MIP images confirms the above findings. IMPRESSION: 1. No evidence of pulmonary embolus.  2. Stable post therapeutic changes within the left parahilar region. 3. Stable bilateral lower lobe bronchial wall thickening and scattered areas of mucous plugging, which could reflect bronchitis or reactive airway disease. No acute airspace disease. 4. Decreased mediastinal and hilar adenopathy, with no residual enlarged lymph nodes on this exam. 5. Stable 5 mm right lower lobe subpleural pulmonary nodule. 6. Aortic Atherosclerosis (ICD10-I70.0) and  Emphysema (ICD10-J43.9). Electronically Signed   By: Randa Ngo M.D.   On: 08/06/2021 22:13   DG Chest 2 View  Result Date: 08/06/2021 CLINICAL DATA:  Hemoptysis EXAM: CHEST - 2 VIEW COMPARISON:  01/05/2021 FINDINGS: Improved left upper lobe aeration since prior. Mild opacification at the left suprahilar lung which is stable from 03/24/2021 CT. Porta catheter on the right with tip at the SVC. There is no edema, consolidation, effusion, or pneumothorax. Normal heart size and mediastinal contours. Eventration of the right diaphragm and left juxta phrenic peak. IMPRESSION: No evidence of active disease.  Stable from chest CT January 2023. Electronically Signed   By: Jorje Guild M.D.   On: 08/06/2021 11:38     Assessment and plan- Patient is a 76 y.o. 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  She is here to discuss CT scan results and for cycle 1 of maintenance Alimta  Patient completed concurrent radiation with [place] Alimta for 4 cycles ending in May 2023.I have reviewed CT chest abdomen pelvis images independently and discussed findings with the patient which shows interval treatment response and reduction in the size of FDG avid mediastinal lymph nodes.  Postradiation changes in the perihilar left lung.  Right lower lobe 0.4 cm lung nodule unchanged.  No evidence of distant metastatic disease.  Given that patient hadIn 1 year of completing maintenance durvalumab I would like to speak to her on maintenance Alimta at this time until progression or toxicity.if she were  to develop any worsening anemia AKI or other side effects from chemotherapy I will have a low threshold to stop Alimta.  She will proceed with cycle 1 of maintenance Alimta I will see her back in 3 weeks for cycle 2 of maintenance Alimta along with B12 injection  Patient verbalized understanding.   Visit Diagnosis 1. Encounter for antineoplastic chemotherapy   2. Malignant neoplasm of upper lobe of left lung  ([place])      Dr. Randa Evens, MD, MPH [facility] [phone] 08/25/2021 10:25 AM

## 2021-09-03 ENCOUNTER — Telehealth: Payer: Self-pay | Admitting: *Deleted

## 2021-09-03 ENCOUNTER — Other Ambulatory Visit: Payer: Self-pay | Admitting: Nurse Practitioner

## 2021-09-03 MED ORDER — FLUCONAZOLE 150 MG PO TABS: 150.0000 mg | ORAL_TABLET | Freq: Every day | ORAL | 0 refills | Status: DC

## 2021-09-06 ENCOUNTER — Telehealth: Payer: Self-pay | Admitting: Oncology

## 2021-09-07 ENCOUNTER — Telehealth: Payer: Self-pay

## 2021-09-07 NOTE — Telephone Encounter (Signed)
Dental clearance for dental cleaning has been faxed over to [facility] at Dr. Ledell Peoples office at [phone] and have recieved a fax confirmation.

## 2021-09-08 ENCOUNTER — Ambulatory Visit: Payer: Medicare Other | Admitting: Radiation Oncology

## 2021-09-15 ENCOUNTER — Inpatient Hospital Stay (HOSPITAL_BASED_OUTPATIENT_CLINIC_OR_DEPARTMENT_OTHER): Payer: Medicare Other | Admitting: Oncology

## 2021-09-15 ENCOUNTER — Inpatient Hospital Stay: Payer: Medicare Other

## 2021-09-15 ENCOUNTER — Encounter: Payer: Self-pay | Admitting: Oncology

## 2021-09-15 ENCOUNTER — Inpatient Hospital Stay: Payer: Medicare Other | Attending: Oncology

## 2021-09-15 VITALS — BP 119/64 | HR 99 | Temp 97.0°F | Resp 16 | Ht 61.0 in | Wt 170.0 lb

## 2021-09-15 DIAGNOSIS — C3411 Malignant neoplasm of upper lobe, right bronchus or lung: Secondary | ICD-10-CM | POA: Diagnosis present

## 2021-09-15 DIAGNOSIS — C3490 Malignant neoplasm of unspecified part of unspecified bronchus or lung: Secondary | ICD-10-CM

## 2021-09-15 DIAGNOSIS — D649 Anemia, unspecified: Secondary | ICD-10-CM | POA: Diagnosis not present

## 2021-09-15 DIAGNOSIS — C3412 Malignant neoplasm of upper lobe, left bronchus or lung: Secondary | ICD-10-CM

## 2021-09-15 DIAGNOSIS — N8110 Cystocele, unspecified: Secondary | ICD-10-CM | POA: Diagnosis not present

## 2021-09-15 DIAGNOSIS — Z5111 Encounter for antineoplastic chemotherapy: Secondary | ICD-10-CM | POA: Insufficient documentation

## 2021-09-15 LAB — COMPREHENSIVE METABOLIC PANEL
ALT: 21 U/L (ref 0–44)
AST: 32 U/L (ref 15–41)
Albumin: 3.8 g/dL (ref 3.5–5.0)
Alkaline Phosphatase: 86 U/L (ref 38–126)
Anion gap: 9 (ref 5–15)
BUN: 12 mg/dL (ref 8–23)
CO2: 25 mmol/L (ref 22–32)
Calcium: 8.6 mg/dL — ABNORMAL LOW (ref 8.9–10.3)
Chloride: 103 mmol/L (ref 98–111)
Creatinine, Ser: 0.52 mg/dL (ref 0.44–1.00)
GFR, Estimated: >60 mL/min (ref >60)
Glucose, Bld: 122 mg/dL — ABNORMAL HIGH (ref 70–99)
Potassium: 3.9 mmol/L (ref 3.5–5.1)
Sodium: 137 mmol/L (ref 135–145)
Total Bilirubin: 0.3 mg/dL (ref 0.3–1.2)
Total Protein: 6.2 g/dL — ABNORMAL LOW (ref 6.5–8.1)

## 2021-09-15 LAB — CBC WITH DIFFERENTIAL/PLATELET
Abs Immature Granulocytes: 0.01 10*3/uL (ref 0.00–0.07)
Basophils Absolute: 0 10*3/uL (ref 0.0–0.1)
Basophils Relative: 1 %
Eosinophils Absolute: 0.1 10*3/uL (ref 0.0–0.5)
Eosinophils Relative: 4 %
HCT: 33.8 % — ABNORMAL LOW (ref 36.0–46.0)
Hemoglobin: 11.3 g/dL — ABNORMAL LOW (ref 12.0–15.0)
Immature Granulocytes: 0 %
Lymphocytes Relative: 17 %
Lymphs Abs: 0.6 10*3/uL — ABNORMAL LOW (ref 0.7–4.0)
MCH: 33.2 pg (ref 26.0–34.0)
MCHC: 33.4 g/dL (ref 30.0–36.0)
MCV: 99.4 fL (ref 80.0–100.0)
Monocytes Absolute: 0.4 10*3/uL (ref 0.1–1.0)
Monocytes Relative: 11 %
Neutro Abs: 2.3 10*3/uL (ref 1.7–7.7)
Neutrophils Relative %: 67 %
Platelets: 255 10*3/uL (ref 150–400)
RBC: 3.4 MIL/uL — ABNORMAL LOW (ref 3.87–5.11)
RDW: 13.1 % (ref 11.5–15.5)
WBC: 3.4 10*3/uL — ABNORMAL LOW (ref 4.0–10.5)
nRBC: 0 % (ref 0.0–0.2)

## 2021-09-15 MED ORDER — HEPARIN SOD (PORK) LOCK FLUSH 100 UNIT/ML IV SOLN
INTRAVENOUS | Status: AC
  Administered 2021-09-15: 500 [IU]
  Filled 2021-09-15: qty 5

## 2021-09-15 MED ORDER — CYANOCOBALAMIN 1000 MCG/ML IJ SOLN
1000.0000 ug | Freq: Once | INTRAMUSCULAR | Status: AC
  Administered 2021-09-15: 1000 ug via INTRAMUSCULAR
  Filled 2021-09-15: qty 1

## 2021-09-15 MED ORDER — SODIUM CHLORIDE 0.9 % IV SOLN
Freq: Once | INTRAVENOUS | Status: AC
  Filled 2021-09-15: qty 250

## 2021-09-15 MED ORDER — SODIUM CHLORIDE 0.9 % IV SOLN
10.0000 mg | Freq: Once | INTRAVENOUS | Status: AC
  Administered 2021-09-15: 10 mg via INTRAVENOUS
  Filled 2021-09-15: qty 1

## 2021-09-15 MED ORDER — SODIUM CHLORIDE 0.9 % IV SOLN
500.0000 mg/m2 | Freq: Once | INTRAVENOUS | Status: AC
  Administered 2021-09-15: 900 mg via INTRAVENOUS
  Filled 2021-09-15: qty 20

## 2021-09-15 MED ORDER — HEPARIN SOD (PORK) LOCK FLUSH 100 UNIT/ML IV SOLN
500.0000 [IU] | Freq: Once | INTRAVENOUS | Status: AC | PRN
  Filled 2021-09-15: qty 5

## 2021-09-15 NOTE — Patient Instructions (Signed)
[facility] [place] [facility]  Discharge Instructions: Thank you for choosing [facility] to provide your oncology and hematology care.  If you have a lab appointment with the Cancer Center, please go directly to the Cancer Center and check in at the registration area.  Wear comfortable clothing and clothing appropriate for easy access to any Portacath or PICC line.   We strive to give you quality time with your provider. You may need to reschedule your appointment if you arrive late (15 or more minutes).  Arriving late affects you and other patients whose appointments are after yours.  Also, if you miss three or more appointments without notifying the office, you may be dismissed from the clinic at the provider's discretion.      For prescription refill requests, have your pharmacy contact our office and allow 72 hours for refills to be completed.    Today you received the following chemotherapy and/or immunotherapy agents: Alimta      To help prevent nausea and vomiting after your treatment, we encourage you to take your nausea medication as directed.  BELOW ARE SYMPTOMS THAT SHOULD BE REPORTED IMMEDIATELY: *FEVER GREATER THAN 100.4 F (38 C) OR HIGHER *CHILLS OR SWEATING *NAUSEA AND VOMITING THAT IS NOT CONTROLLED WITH YOUR NAUSEA MEDICATION *UNUSUAL SHORTNESS OF BREATH *UNUSUAL BRUISING OR BLEEDING *URINARY PROBLEMS (pain or burning when urinating, or frequent urination) *BOWEL PROBLEMS (unusual diarrhea, constipation, pain near the anus) TENDERNESS IN MOUTH AND THROAT WITH OR WITHOUT PRESENCE OF ULCERS (sore throat, sores in mouth, or a toothache) UNUSUAL RASH, SWELLING OR PAIN  UNUSUAL VAGINAL DISCHARGE OR ITCHING   Items with * indicate a potential emergency and should be followed up as soon as possible or go to the Emergency Department if any problems should occur.  Please show the CHEMOTHERAPY ALERT CARD or IMMUNOTHERAPY ALERT CARD at check-in to the  Emergency Department and triage nurse.  Should you have questions after your visit or need to cancel or reschedule your appointment, please contact [facility]  [phone] and follow the prompts.  Office hours are 8:00 a.m. to 4:30 p.m. Monday - Friday. Please note that voicemails left after 4:00 p.m. may not be returned until the following business day.  We are closed weekends and major holidays. You have access to a nurse at all times for urgent questions. Please call the main number to the clinic [phone] and follow the prompts.  For any non-urgent questions, you may also contact your provider using MyChart. We now offer e-Visits for anyone 18 and older to request care online for non-urgent symptoms. For details visit [email or web address].   Also download the MyChart app! Go to the app store, search "MyChart", open the app, select [place], and log in with your MyChart username and password.  Masks are optional in the cancer centers. If you would like for your care team to wear a mask while they are taking care of you, please let them know. For doctor visits, patients may have with them one support person who is at least 77 years old. At this time, visitors are not allowed in the infusion area.   

## 2021-09-15 NOTE — Progress Notes (Signed)
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  EGFR and Ros as well as MET testing negative. PD-L1 was 50%   Patient completed concurrent chemoradiation with weekly carbotaxol chemotherapy on 03/12/2019.  Maintenance durvalumab started in January 2021 after scan showed partial response.  Patient completed 1 year of maintenance durvalumab in February 2022   Patient was found to have enlarging mediastinal adenopathyOn subsequent CT scan in January 2023.  This was followed by a PET scan which showed hypermetabolic mediastinal and right hilar lymph nodes but no other evidence of disease elsewhere.  This was biopsied and was consistent with non-small cell lung cancer specifically adenocarcinoma.  Cells were positive for TTF-1 and Napsin A and negative for p40.   Patient completed concurrent chemoradiation with 4 cycles of carbo Alimta ending in May 2023.  Scan showed partial response.  Plan is for maintenance Alimta until progression or toxicity   Interval history-she has on and off episodes of bronchitis for which she follows up with Dr. Meredeth Ide.  Most  recently she is on Levaquin for the same.  Has chronic barking type of cough  ECOG PS- 1 Pain scale- 0   Review of systems- Review of Systems  Constitutional:  Negative for chills, fever, malaise/fatigue and weight loss.  HENT:  Negative for congestion, ear discharge and nosebleeds.   Eyes:  Negative for blurred vision.  Respiratory:  Positive for cough. Negative for hemoptysis, sputum production, shortness of breath and wheezing.   Cardiovascular:  Negative for chest pain, palpitations, orthopnea and claudication.  Gastrointestinal:  Negative for abdominal pain, blood in stool, constipation, diarrhea, heartburn, melena, nausea and vomiting.  Genitourinary:  Negative for dysuria, flank pain, frequency, hematuria and urgency.  Musculoskeletal:  Negative for back pain, joint pain and myalgias.  Skin:  Negative for rash.  Neurological:  Negative for dizziness, tingling, focal weakness, seizures, weakness and headaches.  Endo/Heme/Allergies:  Does not bruise/bleed easily.  Psychiatric/Behavioral:  Negative for depression and suicidal ideas. The patient does not have insomnia.       Allergies  Allergen Reactions   Aspirin Hives, Shortness Of Breath and Other (See Comments)    Difficulty breathing   Celebrex [Celecoxib] Shortness Of Breath   Morphine And Related Nausea And Vomiting and Swelling   Adhesive [Tape] Other (See Comments)    Took top layer of skin off when removed.   Clarithromycin Nausea And Vomiting   Codeine Nausea And Vomiting   Darvon [Propoxyphene Hcl] Nausea And Vomiting   Demerol [Meperidine] Nausea And Vomiting   Flonase [Fluticasone Propionate] Other (See Comments)    Fungal infection in nose   Simvastatin Other (See Comments)    "caused ulcers in mouth, and fever"   Talwin [Pentazocine]  Nausea And Vomiting     Past Medical History:  Diagnosis Date   Anemia    Asthma    No Inhalers--Dr. [name] will order as needed   Bronchiectasis ([place])    mild    Chronic headaches     followed by [facility] migraines   COPD (chronic obstructive pulmonary disease) (HCC)    DDD (degenerative disc disease), lumbar    Diverticulosis    Family history of adverse reaction to anesthesia    mother and sisters-PONV   Gall stones    history of   GERD (gastroesophageal reflux disease)    EGD 8/09- non bleeding erosive gastritis, documentd esophageal ulcerations.    Hiatal hernia    small   History of kidney stones    History of pneumonia    Hypercholesterolemia    IBS (irritable bowel syndrome)    Malignant neoplasm of upper lobe of left lung ([place]) 12/27/2018   Meniere disease    Murmur    Osteoarthritis    lumbar disc disease, left hip   Personal history of chemotherapy    Personal history of radiation therapy    Pneumonia 12/2020   PONV (postoperative nausea and vomiting)    Sleep apnea    uses cpap   Vertigo    Weakness of right side of body      Past Surgical History:  Procedure Laterality Date   ABDOMINAL HYSTERECTOMY  age 85   ANTERIOR CERVICAL DECOMP/DISCECTOMY FUSION N/A 02/24/2015   Procedure: CERVICAL FOUR-FIVE, CERVICAL FIVE-SIX, CERVICAL SIX-SEVEN ANTERIOR CERVICAL DECOMPRESSION/DISCECTOMY FUSION ;  Surgeon: [name], MD;  Location: [facility];  Service: Neurosurgery;  Laterality: N/A;  C45 C56 C67 anterior cervical decompression with fusion interbody prosthesis plating and bonegraft   APPENDECTOMY     BACK SURGERY  1988   4th lumbar fusion   BLADDER SURGERY N/A    with vaginal wall repair   BREAST CYST ASPIRATION Bilateral    neg   BREAST SURGERY Bilateral    cyst removed and reduction   CARDIAC CATHETERIZATION  2014   CARPAL TUNNEL RELEASE Right 02/11/2016   Procedure: CARPAL TUNNEL RELEASE;  Surgeon: [name], MD;  Location: [facility];  Service: Orthopedics;  Laterality: Right;   CATARACT EXTRACTION W/ INTRAOCULAR LENS IMPLANT Bilateral 2015   CHOLECYSTECTOMY     EUS N/A 05/31/2012   Procedure: UPPER  ENDOSCOPIC ULTRASOUND (EUS) LINEAR;  Surgeon: [name], MD;  Location: [facility];  Service: Endoscopy;  Laterality: N/A;   EXCISIONAL HEMORRHOIDECTOMY     JOINT REPLACEMENT Bilateral    KNEE ARTHROSCOPY WITH LATERAL MENISECTOMY Right 07/07/2015   Procedure: KNEE ARTHROSCOPY WITH LATERAL MENISECTOMY, PARTIAL SYNOVECTOMY;  Surgeon: [name], MD;  Location: [facility];  Service: Orthopedics;  Laterality: Right;   LUMBAR LAMINECTOMY     PORTA CATH INSERTION N/A 01/07/2019   Procedure: PORTA CATH INSERTION;  Surgeon: [name], MD;  Location: [facility];  Service: Cardiovascular;  Laterality: N/A;   REDUCTION MAMMAPLASTY  1990   RIGHT OOPHORECTOMY     TOTAL HIP ARTHROPLASTY Left 05/01/2014   Dr. [name]   TOTAL HIP ARTHROPLASTY Right 08/05/2014   Procedure: TOTAL HIP ARTHROPLASTY ANTERIOR APPROACH;  Surgeon: [name], MD;  Location: [facility];  Service: Orthopedics;  Laterality: Right;   ULNAR NERVE TRANSPOSITION Right 02/11/2016   Procedure: ULNAR NERVE DECOMPRESSION/TRANSPOSITION;  Surgeon: [name], MD;  Location: [facility];  Service: Orthopedics;  Laterality: Right;   VIDEO BRONCHOSCOPY WITH ENDOBRONCHIAL ULTRASOUND  Left 12/19/2018   Procedure: VIDEO BRONCHOSCOPY WITH ENDOBRONCHIAL ULTRASOUND, LEFT, SLEEP APNEA;  Surgeon: [name], MD;  Location: [facility];  Service: Thoracic;  Laterality: Left;   VIDEO BRONCHOSCOPY WITH ENDOBRONCHIAL ULTRASOUND Right 05/14/2021   Procedure: VIDEO BRONCHOSCOPY WITH ENDOBRONCHIAL ULTRASOUND;  Surgeon: Tyler Pita, MD;  Location: [facility];  Service: Pulmonary;  Laterality: Right;    Social History   Socioeconomic History   Marital status: Married    Spouse name: Not on file   Number of children: Not on file   Years of education: Not on file   Highest education level: Not on file  Occupational History   Not on file  Tobacco Use   Smoking status: Former    Packs/day: 1.00    Years: 40.00    Total pack  years: 40.00    Types: Cigarettes    Quit date: 06/13/2007    Years since quitting: 14.2   Smokeless tobacco: Never  Vaping Use   Vaping Use: Never used  Substance and Sexual Activity   Alcohol use: No    Alcohol/week: 0.0 standard drinks of alcohol   Drug use: No   Sexual activity: Not on file  Other Topics Concern   Not on file  Social History Narrative   Not on file   Social Determinants of Health   Financial Resource Strain: Not on file  Food Insecurity: Not on file  Transportation Needs: Not on file  Physical Activity: Not on file  Stress: Not on file  Social Connections: Not on file  Intimate Partner Violence: Not on file    Family History  Problem Relation Age of Onset   Heart disease Mother        s/p stent   Hypertension Mother    Hypercholesterolemia Mother    Diabetes Father    Stomach cancer Other        uncle   Breast cancer Cousin    Breast cancer Paternal Aunt      Current Outpatient Medications:    acetaminophen (TYLENOL) 500 MG tablet, Take 500 mg by mouth 2 (two) times daily., Disp: , Rfl:    albuterol (VENTOLIN HFA) 108 (90 Base) MCG/ACT inhaler, Inhale 2 puffs into the lungs every 6 (six) hours as needed for wheezing or shortness of breath., Disp: , Rfl:    Bioflavonoid Products (ESTER C PO), Take 1 tablet by mouth 3 (three) times daily. 500 mg, Disp: , Rfl:    Calcium-Magnesium-Zinc (CAL-MAG-ZINC PO), Take 1 tablet by mouth daily., Disp: , Rfl:    carboxymethylcellulose (REFRESH PLUS) 0.5 % SOLN, Place 1 drop into both eyes 3 (three) times daily as needed (dry eyes)., Disp: , Rfl:    cetirizine (ZYRTEC) 5 MG tablet, Take 1 tablet (5 mg total) by mouth daily., Disp: 30 tablet, Rfl: 0   Cholecalciferol (EQL VITAMIN D3) 25 MCG (1000 UT) capsule, Take 1,000 Units by mouth daily., Disp: , Rfl:    Coenzyme Q10 (CO Q 10) 100 MG CAPS, Take 100 mg by mouth daily. , Disp: , Rfl:    Cyanocobalamin (VITAMIN B-12) 2500 MCG SUBL, Place 2,500 mcg under the  tongue daily., Disp: , Rfl:    cyclobenzaprine (FLEXERIL) 10 MG tablet, Take 10 mg by mouth at bedtime., Disp: , Rfl:    dexamethasone (DECADRON) 4 MG tablet, Take 1 tab every 12 hours the day before pemetrexed chemo, then take 2 tabs once a day for 3 days starting the day after carboplatin., Disp: 30 tablet, Rfl: 1  DHEA 25 MG CAPS, Take 25 mg by mouth daily., Disp: , Rfl:    ezetimibe (ZETIA) 10 MG tablet, Take 5 mg by mouth in the morning and at bedtime., Disp: , Rfl:    folic acid (FOLVITE) 1 MG tablet, Take 1 tablet (1 mg total) by mouth daily. Start 7 days before pemetrexed chemotherapy. Continue until 21 days after pemetrexed completed., Disp: 100 tablet, Rfl: 3   folic acid (FOLVITE) 573 MCG tablet, Take 400 mcg by mouth daily., Disp: , Rfl:    gabapentin (NEURONTIN) 100 MG capsule, Take 100 mg by mouth 3 (three) times daily., Disp: , Rfl:    Garlic 2202 MG CAPS, Take 1,000 mg by mouth daily., Disp: , Rfl:    Histamine Dihydrochloride (AUSTRALIAN DREAM ARTHRITIS) 0.025 % CREA, Apply 1 application. topically 4 (four) times daily as needed (pain)., Disp: , Rfl:    HYDROcodone-acetaminophen (NORCO/VICODIN) 5-325 MG tablet, 1-2 tabs po bid prn (Patient taking differently: Take 1 tablet by mouth at bedtime.), Disp: 10 tablet, Rfl: 0   levOCARNitine (L-CARNITINE) 250 MG TABS, Take 1 tablet by mouth daily. With Chromium 250 mg, Disp: , Rfl:    levofloxacin (LEVAQUIN) 500 MG tablet, Take 500 mg by mouth daily., Disp: , Rfl:    ondansetron (ZOFRAN) 8 MG tablet, Take 1 tablet (8 mg total) by mouth 2 (two) times daily as needed (Nausea or vomiting). Start if needed on the third day after carboplatin., Disp: 30 tablet, Rfl: 1   pantoprazole (PROTONIX) 40 MG tablet, Take 40 mg by mouth 2 (two) times daily., Disp: , Rfl:    polyethylene glycol powder (GLYCOLAX/MIRALAX) 17 GM/SCOOP powder, Take 17 g by mouth daily as needed for moderate constipation., Disp: , Rfl:    pyridOXINE (VITAMIN B-6) 100 MG tablet,  Take 100 mg by mouth daily., Disp: , Rfl:    Selenium 200 MCG CAPS, Take 200 mcg by mouth daily., Disp: , Rfl:    Sennosides (SENNA) 8.6 MG CAPS, Take 1 capsule by mouth daily as needed (constipation)., Disp: , Rfl:    sucralfate (CARAFATE) 1 GM/10ML suspension, Take 1 g by mouth daily., Disp: , Rfl:    Tiotropium Bromide-Olodaterol (STIOLTO RESPIMAT) 2.5-2.5 MCG/ACT AERS, Inhale 2 puffs into the lungs daily., Disp: 4 g, Rfl: 10   triamcinolone (NASACORT) 55 MCG/ACT AERO nasal inhaler, Place 2 sprays into the nose daily., Disp: , Rfl:    UBRELVY 100 MG TABS, Take 100 mg by mouth daily as needed (migraines)., Disp: , Rfl:    Vitamin A 2400 MCG (8000 UT) CAPS, Take 8,000 Units by mouth daily., Disp: , Rfl:    Vitamin E 200 units TABS, Take 200 Units by mouth daily., Disp: , Rfl:    lidocaine-prilocaine (EMLA) cream, Apply to affected area once (Patient not taking: Reported on 09/15/2021), Disp: 30 g, Rfl: 3   meclizine (ANTIVERT) 25 MG tablet, Take 1 tablet (25 mg total) by mouth every 6 (six) hours as needed for dizziness. (Patient not taking: Reported on 09/15/2021), Disp: 30 tablet, Rfl: 0   predniSONE (DELTASONE) 20 MG tablet, Take 60 mg by mouth daily. (Patient not taking: Reported on 09/15/2021), Disp: , Rfl:    prochlorperazine (COMPAZINE) 10 MG tablet, Take 1 tablet (10 mg total) by mouth every 6 (six) hours as needed (Nausea or vomiting). (Patient not taking: Reported on 09/15/2021), Disp: 30 tablet, Rfl: 1 No current facility-administered medications for this visit.  Facility-Administered Medications Ordered in Other Visits:    heparin lock flush 100 UNIT/ML  injection, , , ,    heparin lock flush 100 unit/mL, 500 Units, Intracatheter, Once PRN, Sindy Guadeloupe, MD   PEMEtrexed (ALIMTA) 900 mg in sodium chloride 0.9 % 100 mL chemo infusion, 500 mg/m2 (Treatment Plan Recorded), Intravenous, Once, Sindy Guadeloupe, MD  Physical exam:  Vitals:   09/15/21 1414  BP: 119/64  Pulse: 99  Resp: 16   Temp: (!) 97 F (36.1 C)  TempSrc: Tympanic  SpO2: 97%  Weight: 170 lb (77.1 kg)  Height: $Remove'5\' 1"'AgckpHf$  (1.549 m)   Physical Exam Constitutional:      General: She is not in acute distress. Cardiovascular:     Rate and Rhythm: Normal rate and regular rhythm.     Heart sounds: Normal heart sounds.  Pulmonary:     Effort: Pulmonary effort is normal.     Comments: Scattered bilateral rhonchi over the bases Abdominal:     General: Bowel sounds are normal.     Palpations: Abdomen is soft.  Skin:    General: Skin is warm and dry.  Neurological:     Mental Status: She is alert and oriented to person, place, and time.         Latest Ref Rng & Units 09/15/2021    1:43 PM  CMP  Glucose 70 - 99 mg/dL 122   BUN 8 - 23 mg/dL 12   Creatinine 0.44 - 1.00 mg/dL 0.52   Sodium 135 - 145 mmol/L 137   Potassium 3.5 - 5.1 mmol/L 3.9   Chloride 98 - 111 mmol/L 103   CO2 22 - 32 mmol/L 25   Calcium 8.9 - 10.3 mg/dL 8.6   Total Protein 6.5 - 8.1 g/dL 6.2   Total Bilirubin 0.3 - 1.2 mg/dL 0.3   Alkaline Phos 38 - 126 U/L 86   AST 15 - 41 U/L 32   ALT 0 - 44 U/L 21       Latest Ref Rng & Units 09/15/2021    1:43 PM  CBC  WBC 4.0 - 10.5 K/uL 3.4   Hemoglobin 12.0 - 15.0 g/dL 11.3   Hematocrit 36.0 - 46.0 % 33.8   Platelets 150 - 400 K/uL 255      Assessment and plan- Patient is a 76 y.o. 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  She is here for on treatment assessment prior to cycle 2 of maintenance Alimta  Counts okay to proceed with cycle 2 of maintenance Alimta today.  I will see her back in 3 weeks for cycle 3.  Plan to repeat scans after 4 cycles.  I will plan to continue Alimta until progression or toxicity.  So far anemia is stable and renal functions remained stable.  Mild lymphopenia continue to monitor  Patient will receive B12 injection at her next cycle   Visit Diagnosis 1. Encounter for antineoplastic chemotherapy   2. Malignant neoplasm of upper lobe of  left lung ([place])      Dr. Randa Evens, MD, MPH [facility] at [facility] [phone] 09/15/2021 3:49 PM

## 2021-09-20 ENCOUNTER — Encounter: Payer: Self-pay | Admitting: Urology

## 2021-09-20 ENCOUNTER — Ambulatory Visit (INDEPENDENT_AMBULATORY_CARE_PROVIDER_SITE_OTHER): Payer: Medicare Other | Admitting: Urology

## 2021-09-20 ENCOUNTER — Telehealth: Payer: Self-pay

## 2021-09-20 VITALS — BP 121/73 | HR 116 | Ht 61.0 in | Wt 166.0 lb

## 2021-09-20 DIAGNOSIS — N3946 Mixed incontinence: Secondary | ICD-10-CM

## 2021-09-20 NOTE — Telephone Encounter (Signed)
Dental Clearance for crowns has been faxed to Dr. Larwance Rote office at [phone] and recieved a fax confirmation.

## 2021-09-20 NOTE — Progress Notes (Signed)
09/20/2021 2:45 PM   Jennifer Hodges 15-Mar-1945 [id number]  Referring provider: [name], MD [place],  [place] [address]  Chief Complaint  Patient presents with   Urinary Incontinence    6wk follow up    HPI: I was consulted to assess the patient's voiding dysfunction.  She can leak with coughing sneezing.  She can have urge incontinence.  She describes foot on the floor syndrome.  She has mild bedwetting.  Most days she does not wear a pad.   She describes a prolapse repair and bladder suspension by gynecology and Dr. [name] about 6 years ago.  She describes emptying her bladder that he wants to keep an eye on like a cyst.   Her flow is sometimes poor sometimes good.  She has had a hysterectomy.  She is prone to constipation   I reviewed the medical records and the symptoms in 2018 were very similar with mild intermittent mixed incontinence generally not wearing a pad.  There was no reference of anything abnormal in the bladder.  She was using some local estrogen cream at the meatus.   She has had radiation and chemotherapy and is lung cancer free currently.    Return in 3-5 weeks for cystoscopy for baseline looking bladder.  Call if urine culture is positive.  I think reassurance will be helpful.  I will check for any foreign body in the bladder and any vaginal issues   She does have mild mixed incontinence that is stable.  She does not want medication.  She sometimes wears a pad especially at night On physical examination she had a large grade 2 or mild grade 3 cystocele that just reached the introitus.  She lost 2 to 3 cm of vaginal length.  No stress incontinence   Cystoscopy: normal   Reassurance given.  Watchful waiting for prolapse.  See as needed   Today I last saw the patient in April 2022.  2 weeks ago the patient leaked without awareness which was new for her.  Now she has stress incontinence if she has a bad cough or sneeze.  She has urge  incontinence.  She wears 1 or 2 pads a day moderately wet or soaked.  She has mild to moderate bedwetting.  She is complaining of vaginal dryness.  Sometimes she has fecal matter with wiping but not daily.  She has had neck and lumbar surgery.  She thinks she gets 2 bladder infections a year.   She is on her second year of chemotherapy and radiation for bilateral lung cancer and perhaps a mediastinal mass   On pelvic examination she had a well supported bladder neck and a high grade 1 cystocele.  She had a large posterior defect and may have an enterocele.  The cuff distended from approximately 8 cm to 4 5 cm.  She had moderate atrophy.    Picture was drawn.  Patient has a posterior defect and based upon age and comorbidities I recommended conservative approaches.  Medical treatment of mixed incontinence discussed.  Call if culture positive.   She has tried estrogen before.  Reevaluate 6 weeks on Myrbetriq 50 mg samples and prescription.  She is motivated to try to help her mixed incontinence  Today\ Patient was not improved on Myrbetriq.  She would like to choose watchful waiting.  No infections.  Gets little bit of burning.  She has been through a lot with her lung cancer and chemotherapy.   PMH:  Past Medical History:  Diagnosis Date   Anemia    Asthma    No Inhalers--Dr. [name] will order as needed   Bronchiectasis ([place])    mild   Chronic headaches     followed by [facility] migraines   COPD (chronic obstructive pulmonary disease) (HCC)    DDD (degenerative disc disease), lumbar    Diverticulosis    Family history of adverse reaction to anesthesia    mother and sisters-PONV   Gall stones    history of   GERD (gastroesophageal reflux disease)    EGD 8/09- non bleeding erosive gastritis, documentd esophageal ulcerations.    Hiatal hernia    small   History of kidney stones    History of pneumonia    Hypercholesterolemia    IBS (irritable bowel syndrome)    Malignant  neoplasm of upper lobe of left lung ([place]) 12/27/2018   Meniere disease    Murmur    Osteoarthritis    lumbar disc disease, left hip   Personal history of chemotherapy    Personal history of radiation therapy    Pneumonia 12/2020   PONV (postoperative nausea and vomiting)    Sleep apnea    uses cpap   Vertigo    Weakness of right side of body     Surgical History: Past Surgical History:  Procedure Laterality Date   ABDOMINAL HYSTERECTOMY  age 89   ANTERIOR CERVICAL DECOMP/DISCECTOMY FUSION N/A 02/24/2015   Procedure: CERVICAL FOUR-FIVE, CERVICAL FIVE-SIX, CERVICAL SIX-SEVEN ANTERIOR CERVICAL DECOMPRESSION/DISCECTOMY FUSION ;  Surgeon: [name], MD;  Location: [facility];  Service: Neurosurgery;  Laterality: N/A;  C45 C56 C67 anterior cervical decompression with fusion interbody prosthesis plating and bonegraft   APPENDECTOMY     BACK SURGERY  1988   4th lumbar fusion   BLADDER SURGERY N/A    with vaginal wall repair   BREAST CYST ASPIRATION Bilateral    neg   BREAST SURGERY Bilateral    cyst removed and reduction   CARDIAC CATHETERIZATION  2014   CARPAL TUNNEL RELEASE Right 02/11/2016   Procedure: CARPAL TUNNEL RELEASE;  Surgeon: [name], MD;  Location: [facility];  Service: Orthopedics;  Laterality: Right;   CATARACT EXTRACTION W/ INTRAOCULAR LENS IMPLANT Bilateral 2015   CHOLECYSTECTOMY     EUS N/A 05/31/2012   Procedure: UPPER ENDOSCOPIC ULTRASOUND (EUS) LINEAR;  Surgeon: [name], MD;  Location: [facility];  Service: Endoscopy;  Laterality: N/A;   EXCISIONAL HEMORRHOIDECTOMY     JOINT REPLACEMENT Bilateral    KNEE ARTHROSCOPY WITH LATERAL MENISECTOMY Right 07/07/2015   Procedure: KNEE ARTHROSCOPY WITH LATERAL MENISECTOMY, PARTIAL SYNOVECTOMY;  Surgeon: [name], MD;  Location: [facility];  Service: Orthopedics;  Laterality: Right;   LUMBAR LAMINECTOMY     PORTA CATH INSERTION N/A 01/07/2019   Procedure: PORTA CATH INSERTION;  Surgeon: [name], MD;  Location: [facility];  Service: Cardiovascular;  Laterality: N/A;   REDUCTION MAMMAPLASTY  1990   RIGHT OOPHORECTOMY     TOTAL HIP ARTHROPLASTY Left 05/01/2014   Dr. [name]   TOTAL HIP ARTHROPLASTY Right 08/05/2014   Procedure: TOTAL HIP ARTHROPLASTY ANTERIOR APPROACH;  Surgeon: [name], MD;  Location: [facility];  Service: Orthopedics;  Laterality: Right;   ULNAR NERVE TRANSPOSITION Right 02/11/2016   Procedure: ULNAR NERVE DECOMPRESSION/TRANSPOSITION;  Surgeon: [name], MD;  Location: [facility];  Service: Orthopedics;  Laterality: Right;   VIDEO BRONCHOSCOPY WITH ENDOBRONCHIAL ULTRASOUND Left 12/19/2018   Procedure: VIDEO  BRONCHOSCOPY WITH ENDOBRONCHIAL ULTRASOUND, LEFT, SLEEP APNEA;  Surgeon: [name], MD;  Location: [facility];  Service: Thoracic;  Laterality: Left;   VIDEO BRONCHOSCOPY WITH ENDOBRONCHIAL ULTRASOUND Right 05/14/2021   Procedure: VIDEO BRONCHOSCOPY WITH ENDOBRONCHIAL ULTRASOUND;  Surgeon: Tyler Pita, MD;  Location: [facility];  Service: Pulmonary;  Laterality: Right;    Home Medications:  Allergies as of 09/20/2021       Reactions   Aspirin Hives, Shortness Of Breath, Other (See Comments)   Difficulty breathing   Celebrex [celecoxib] Shortness Of Breath   Morphine And Related Nausea And Vomiting, Swelling   Adhesive [tape] Other (See Comments)   Took top layer of skin off when removed.   Clarithromycin Nausea And Vomiting   Codeine Nausea And Vomiting   Darvon [propoxyphene Hcl] Nausea And Vomiting   Demerol [meperidine] Nausea And Vomiting   Flonase [fluticasone Propionate] Other (See Comments)   Fungal infection in nose   Simvastatin Other (See Comments)   "caused ulcers in mouth, and fever"   Talwin [pentazocine] Nausea And Vomiting        Medication List        Accurate as of September 20, 2021  2:45 PM. If you have any questions, ask your nurse or doctor.          acetaminophen 500 MG tablet Commonly known  as: TYLENOL Take 500 mg by mouth 2 (two) times daily.   albuterol 108 (90 Base) MCG/ACT inhaler Commonly known as: VENTOLIN HFA Inhale 2 puffs into the lungs every 6 (six) hours as needed for wheezing or shortness of breath.   [place] Dream Arthritis 0.025 % Crea Generic drug: Histamine Dihydrochloride Apply 1 application. topically 4 (four) times daily as needed (pain).   CAL-MAG-ZINC PO Take 1 tablet by mouth daily.   carboxymethylcellulose 0.5 % Soln Commonly known as: REFRESH PLUS Place 1 drop into both eyes 3 (three) times daily as needed (dry eyes).   cetirizine 5 MG tablet Commonly known as: ZYRTEC Take 1 tablet (5 mg total) by mouth daily.   Co Q 10 100 MG Caps Take 100 mg by mouth daily.   cyclobenzaprine 10 MG tablet Commonly known as: FLEXERIL Take 10 mg by mouth at bedtime.   dexamethasone 4 MG tablet Commonly known as: DECADRON Take 1 tab every 12 hours the day before pemetrexed chemo, then take 2 tabs once a day for 3 days starting the day after carboplatin.   DHEA 25 MG Caps Take 25 mg by mouth daily.   EQL Vitamin D3 25 MCG (1000 UT) capsule Generic drug: Cholecalciferol Take 1,000 Units by mouth daily.   ESTER C PO Take 1 tablet by mouth 3 (three) times daily. 500 mg   ezetimibe 10 MG tablet Commonly known as: ZETIA Take 5 mg by mouth in the morning and at bedtime.   folic acid 607 MCG tablet Commonly known as: FOLVITE Take 400 mcg by mouth daily.   folic acid 1 MG tablet Commonly known as: FOLVITE Take 1 tablet (1 mg total) by mouth daily. Start 7 days before pemetrexed chemotherapy. Continue until 21 days after pemetrexed completed.   gabapentin 100 MG capsule Commonly known as: NEURONTIN Take 100 mg by mouth 3 (three) times daily.   Garlic 3710 MG Caps Take 1,000 mg by mouth daily.   HYDROcodone-acetaminophen 5-325 MG tablet Commonly known as: NORCO/VICODIN 1-2 tabs po bid prn What changed:  how much to take how to take  this when to take this additional instructions  L-Carnitine 250 MG Tabs Take 1 tablet by mouth daily. With Chromium 250 mg   levofloxacin 500 MG tablet Commonly known as: LEVAQUIN Take 500 mg by mouth daily.   lidocaine-prilocaine cream Commonly known as: EMLA Apply to affected area once   meclizine 25 MG tablet Commonly known as: ANTIVERT Take 1 tablet (25 mg total) by mouth every 6 (six) hours as needed for dizziness.   ondansetron 8 MG tablet Commonly known as: Zofran Take 1 tablet (8 mg total) by mouth 2 (two) times daily as needed (Nausea or vomiting). Start if needed on the third day after carboplatin.   pantoprazole 40 MG tablet Commonly known as: PROTONIX Take 40 mg by mouth 2 (two) times daily.   polyethylene glycol powder 17 GM/SCOOP powder Commonly known as: GLYCOLAX/MIRALAX Take 17 g by mouth daily as needed for moderate constipation.   predniSONE 20 MG tablet Commonly known as: DELTASONE Take 60 mg by mouth daily.   prochlorperazine 10 MG tablet Commonly known as: COMPAZINE Take 1 tablet (10 mg total) by mouth every 6 (six) hours as needed (Nausea or vomiting).   pyridOXINE 100 MG tablet Commonly known as: VITAMIN B-6 Take 100 mg by mouth daily.   Selenium 200 MCG Caps Take 200 mcg by mouth daily.   Senna 8.6 MG Caps Take 1 capsule by mouth daily as needed (constipation).   Stiolto Respimat 2.5-2.5 MCG/ACT Aers Generic drug: Tiotropium Bromide-Olodaterol Inhale 2 puffs into the lungs daily.   sucralfate 1 GM/10ML suspension Commonly known as: CARAFATE Take 1 g by mouth daily.   triamcinolone 55 MCG/ACT Aero nasal inhaler Commonly known as: NASACORT Place 2 sprays into the nose daily.   Ubrelvy 100 MG Tabs Generic drug: Ubrogepant Take 100 mg by mouth daily as needed (migraines).   Vitamin A 2400 MCG (8000 UT) Caps Take 8,000 Units by mouth daily.   Vitamin B-12 2500 MCG Subl Place 2,500 mcg under the tongue daily.   Vitamin E 200  units Tabs Take 200 Units by mouth daily.        Allergies:  Allergies  Allergen Reactions   Aspirin Hives, Shortness Of Breath and Other (See Comments)    Difficulty breathing   Celebrex [Celecoxib] Shortness Of Breath   Morphine And Related Nausea And Vomiting and Swelling   Adhesive [Tape] Other (See Comments)    Took top layer of skin off when removed.   Clarithromycin Nausea And Vomiting   Codeine Nausea And Vomiting   Darvon [Propoxyphene Hcl] Nausea And Vomiting   Demerol [Meperidine] Nausea And Vomiting   Flonase [Fluticasone Propionate] Other (See Comments)    Fungal infection in nose   Simvastatin Other (See Comments)    "caused ulcers in mouth, and fever"   Talwin [Pentazocine] Nausea And Vomiting    Family History: Family History  Problem Relation Age of Onset   Heart disease Mother        s/p stent   Hypertension Mother    Hypercholesterolemia Mother    Diabetes Father    Stomach cancer Other        uncle   Breast cancer Cousin    Breast cancer Paternal Aunt     Social History:  reports that she quit smoking about 14 years ago. Her smoking use included cigarettes. She has a 40.00 pack-year smoking history. She has never used smokeless tobacco. She reports that she does not drink alcohol and does not use drugs.  ROS:  Physical Exam: BP 121/73   Pulse (!) 116   Ht 5\' 1"  (1.549 m)   Wt 75.3 kg   BMI 31.37 kg/m   Constitutional:  Alert and oriented, No acute distress. HEENT: [place] AT, moist mucus membranes.  Trachea midline, no masses.   Laboratory Data: Lab Results  Component Value Date   WBC 3.4 (L) 09/15/2021   HGB 11.3 (L) 09/15/2021   HCT 33.8 (L) 09/15/2021   MCV 99.4 09/15/2021   PLT 255 09/15/2021    Lab Results  Component Value Date   CREATININE 0.52 09/15/2021    No results found for: "PSA"  No results found for: "TESTOSTERONE"  Lab Results  Component Value Date    HGBA1C 5.7 (H) 02/18/2015    Urinalysis    Component Value Date/Time   COLORURINE YELLOW 07/03/2021 0921   APPEARANCEUR Clear 07/26/2021 1415   LABSPEC 1.010 07/03/2021 0921   PHURINE 6.0 07/03/2021 0921   GLUCOSEU Negative 07/26/2021 1415   GLUCOSEU NEGATIVE 06/19/2014 1002   HGBUR MODERATE (A) 07/03/2021 0921   BILIRUBINUR Negative 07/26/2021 1415   KETONESUR NEGATIVE 07/03/2021 0921   PROTEINUR Negative 07/26/2021 1415   PROTEINUR TRACE (A) 07/03/2021 0921   UROBILINOGEN 1.0 03/04/2015 1339   UROBILINOGEN 0.2 06/19/2014 1002   NITRITE Negative 07/26/2021 1415   NITRITE NEGATIVE 07/03/2021 0921   LEUKOCYTESUR Trace (A) 07/26/2021 1415   LEUKOCYTESUR LARGE (A) 07/03/2021 0921    Pertinent Imaging:   Assessment & Plan: Call if culture positive.  See the patient as needed  1. Mixed incontinence  - Urinalysis, Complete   No follow-ups on file.  Reece Packer, MD  [facility] [address], [place], [place] [address] [phone]

## 2021-09-21 LAB — URINALYSIS, COMPLETE
Bilirubin, UA: NEGATIVE
Glucose, UA: NEGATIVE
Ketones, UA: NEGATIVE
Nitrite, UA: NEGATIVE
Protein,UA: NEGATIVE
Specific Gravity, UA: 1.02 (ref 1.005–1.030)
Urobilinogen, Ur: 0.2 mg/dL (ref 0.2–1.0)
pH, UA: 5.5 (ref 5.0–7.5)

## 2021-09-21 LAB — MICROSCOPIC EXAMINATION
Bacteria, UA: NONE SEEN
RBC, Urine: NONE SEEN /hpf (ref 0–2)

## 2021-09-23 LAB — CULTURE, URINE COMPREHENSIVE

## 2021-10-04 ENCOUNTER — Other Ambulatory Visit: Payer: Self-pay

## 2021-10-05 ENCOUNTER — Other Ambulatory Visit: Payer: Self-pay

## 2021-10-05 DIAGNOSIS — C3412 Malignant neoplasm of upper lobe, left bronchus or lung: Secondary | ICD-10-CM

## 2021-10-05 MED FILL — Dexamethasone Sodium Phosphate Inj 100 MG/10ML: INTRAMUSCULAR | Qty: 1 | Status: AC

## 2021-10-06 ENCOUNTER — Inpatient Hospital Stay: Payer: Medicare Other

## 2021-10-06 ENCOUNTER — Encounter: Payer: Self-pay | Admitting: Oncology

## 2021-10-06 ENCOUNTER — Other Ambulatory Visit: Payer: Self-pay | Admitting: Oncology

## 2021-10-06 ENCOUNTER — Inpatient Hospital Stay (HOSPITAL_BASED_OUTPATIENT_CLINIC_OR_DEPARTMENT_OTHER): Payer: Medicare Other | Admitting: Oncology

## 2021-10-06 VITALS — BP 126/70 | HR 96 | Temp 98.0°F | Resp 18 | Wt 169.4 lb

## 2021-10-06 DIAGNOSIS — C3412 Malignant neoplasm of upper lobe, left bronchus or lung: Secondary | ICD-10-CM

## 2021-10-06 DIAGNOSIS — D649 Anemia, unspecified: Secondary | ICD-10-CM

## 2021-10-06 DIAGNOSIS — Z5111 Encounter for antineoplastic chemotherapy: Secondary | ICD-10-CM | POA: Diagnosis not present

## 2021-10-06 LAB — CBC WITH DIFFERENTIAL/PLATELET
Abs Immature Granulocytes: 0.01 10*3/uL (ref 0.00–0.07)
Basophils Absolute: 0 10*3/uL (ref 0.0–0.1)
Basophils Relative: 1 %
Eosinophils Absolute: 0.1 10*3/uL (ref 0.0–0.5)
Eosinophils Relative: 3 %
HCT: 36.1 % (ref 36.0–46.0)
Hemoglobin: 11.9 g/dL — ABNORMAL LOW (ref 12.0–15.0)
Immature Granulocytes: 0 %
Lymphocytes Relative: 15 %
Lymphs Abs: 0.7 10*3/uL (ref 0.7–4.0)
MCH: 32.3 pg (ref 26.0–34.0)
MCHC: 33 g/dL (ref 30.0–36.0)
MCV: 98.1 fL (ref 80.0–100.0)
Monocytes Absolute: 0.6 10*3/uL (ref 0.1–1.0)
Monocytes Relative: 14 %
Neutro Abs: 3 10*3/uL (ref 1.7–7.7)
Neutrophils Relative %: 67 %
Platelets: 278 10*3/uL (ref 150–400)
RBC: 3.68 MIL/uL — ABNORMAL LOW (ref 3.87–5.11)
RDW: 12.6 % (ref 11.5–15.5)
WBC: 4.4 10*3/uL (ref 4.0–10.5)
nRBC: 0 % (ref 0.0–0.2)

## 2021-10-06 LAB — COMPREHENSIVE METABOLIC PANEL
ALT: 22 U/L (ref 0–44)
AST: 34 U/L (ref 15–41)
Albumin: 4.1 g/dL (ref 3.5–5.0)
Alkaline Phosphatase: 88 U/L (ref 38–126)
Anion gap: 9 (ref 5–15)
BUN: 11 mg/dL (ref 8–23)
CO2: 26 mmol/L (ref 22–32)
Calcium: 9.1 mg/dL (ref 8.9–10.3)
Chloride: 104 mmol/L (ref 98–111)
Creatinine, Ser: 0.48 mg/dL (ref 0.44–1.00)
GFR, Estimated: >60 mL/min (ref >60)
Glucose, Bld: 103 mg/dL — ABNORMAL HIGH (ref 70–99)
Potassium: 4.1 mmol/L (ref 3.5–5.1)
Sodium: 139 mmol/L (ref 135–145)
Total Bilirubin: 0.5 mg/dL (ref 0.3–1.2)
Total Protein: 6.4 g/dL — ABNORMAL LOW (ref 6.5–8.1)

## 2021-10-06 MED ORDER — SODIUM CHLORIDE 0.9 % IV SOLN
10.0000 mg | Freq: Once | INTRAVENOUS | Status: AC
  Administered 2021-10-06: 10 mg via INTRAVENOUS
  Filled 2021-10-06: qty 10

## 2021-10-06 MED ORDER — CYANOCOBALAMIN 1000 MCG/ML IJ SOLN
1000.0000 ug | Freq: Once | INTRAMUSCULAR | Status: AC
  Administered 2021-10-06: 1000 ug via INTRAMUSCULAR
  Filled 2021-10-06: qty 1

## 2021-10-06 MED ORDER — HEPARIN SOD (PORK) LOCK FLUSH 100 UNIT/ML IV SOLN
500.0000 [IU] | Freq: Once | INTRAVENOUS | Status: AC | PRN
  Administered 2021-10-06: 500 [IU]
  Filled 2021-10-06: qty 5

## 2021-10-06 MED ORDER — SODIUM CHLORIDE 0.9 % IV SOLN
500.0000 mg/m2 | Freq: Once | INTRAVENOUS | Status: AC
  Administered 2021-10-06: 900 mg via INTRAVENOUS
  Filled 2021-10-06: qty 36

## 2021-10-06 MED ORDER — SODIUM CHLORIDE 0.9 % IV SOLN
Freq: Once | INTRAVENOUS | Status: AC
  Filled 2021-10-06: qty 250

## 2021-10-06 NOTE — Progress Notes (Unsigned)
  Pt states lately she has been experiencing dizziness; along with left hand being swollen.

## 2021-10-06 NOTE — Progress Notes (Signed)
Hematology/Oncology Consult note 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  EGFR and Ros as well as MET testing negative. PD-L1 was 50%   Patient completed concurrent chemoradiation with weekly carbotaxol chemotherapy on 03/12/2019.  Maintenance durvalumab started in January 2021 after scan showed partial response.  Patient completed 1 year of maintenance durvalumab in February 2022   Patient was found to have enlarging mediastinal adenopathyOn subsequent CT scan in January 2023.  This was followed by a PET scan which showed hypermetabolic mediastinal and right hilar lymph nodes but no other evidence of disease elsewhere.  This was biopsied and was consistent with non-small cell lung cancer specifically adenocarcinoma.  Cells were positive for TTF-1 and Napsin A and negative for p40.   Patient completed concurrent chemoradiation with 4 cycles of carbo Alimta ending in May 2023.  Scan showed partial response.  Plan is for maintenance Alimta until progression or toxicity   Interval history-patient gets on and off episodes of bronchitis for which she gets repeated courses of  antibiotics.  Also has problems with bladder prolapse for which she sees uroGYN  ECOG PS- 1 Pain scale- 0   Review of systems- Review of Systems  Constitutional:  Negative for chills, fever, malaise/fatigue and weight loss.  HENT:  Negative for congestion, ear discharge and nosebleeds.   Eyes:  Negative for blurred vision.  Respiratory:  Positive for cough. Negative for hemoptysis, sputum production, shortness of breath and wheezing.   Cardiovascular:  Negative for chest pain, palpitations, orthopnea and claudication.  Gastrointestinal:  Negative for abdominal pain, blood in stool, constipation, diarrhea, heartburn, melena, nausea and vomiting.  Genitourinary:  Negative for dysuria, flank pain, frequency, hematuria and urgency.  Musculoskeletal:  Negative for back pain, joint pain and myalgias.  Skin:  Negative for rash.  Neurological:  Negative for dizziness, tingling, focal weakness, seizures, weakness and headaches.  Endo/Heme/Allergies:  Does not bruise/bleed easily.  Psychiatric/Behavioral:  Negative for depression and suicidal ideas. The patient does not have insomnia.       Allergies  Allergen Reactions   Aspirin Hives, Shortness Of Breath and Other (See Comments)    Difficulty breathing   Celebrex [Celecoxib] Shortness Of Breath   Morphine And Related Nausea And Vomiting and Swelling   Adhesive [Tape] Other (See Comments)    Took top layer of skin off when removed.   Clarithromycin Nausea And Vomiting   Codeine Nausea And Vomiting   Darvon [Propoxyphene Hcl] Nausea And Vomiting   Demerol [Meperidine] Nausea And Vomiting   Flonase [Fluticasone Propionate] Other (See Comments)    Fungal infection in nose   Simvastatin Other (See Comments)    "caused ulcers in mouth, and fever"   Talwin [Pentazocine] Nausea And Vomiting  Past Medical History:  Diagnosis Date   Anemia    Asthma    No Inhalers--Dr. [name] will order as needed   Bronchiectasis ([place])    mild    Chronic headaches     followed by [facility] migraines   COPD (chronic obstructive pulmonary disease) (HCC)    DDD (degenerative disc disease), lumbar    Diverticulosis    Family history of adverse reaction to anesthesia    mother and sisters-PONV   Gall stones    history of   GERD (gastroesophageal reflux disease)    EGD 8/09- non bleeding erosive gastritis, documentd esophageal ulcerations.    Hiatal hernia    small   History of kidney stones    History of pneumonia    Hypercholesterolemia    IBS (irritable bowel syndrome)    Malignant neoplasm of upper lobe of left lung ([place]) 12/27/2018   Meniere disease    Murmur    Osteoarthritis    lumbar disc disease, left hip   Personal history of chemotherapy    Personal history of radiation therapy    Pneumonia 12/2020   PONV (postoperative nausea and vomiting)    Sleep apnea    uses cpap   Vertigo    Weakness of right side of body      Past Surgical History:  Procedure Laterality Date   ABDOMINAL HYSTERECTOMY  age 25   ANTERIOR CERVICAL DECOMP/DISCECTOMY FUSION N/A 02/24/2015   Procedure: CERVICAL FOUR-FIVE, CERVICAL FIVE-SIX, CERVICAL SIX-SEVEN ANTERIOR CERVICAL DECOMPRESSION/DISCECTOMY FUSION ;  Surgeon: [name], MD;  Location: [place] [facility];  Service: Neurosurgery;  Laterality: N/A;  C45 C56 C67 anterior cervical decompression with fusion interbody prosthesis plating and bonegraft   APPENDECTOMY     BACK SURGERY  1988   4th lumbar fusion   BLADDER SURGERY N/A    with vaginal wall repair   BREAST CYST ASPIRATION Bilateral    neg   BREAST SURGERY Bilateral    cyst removed and reduction   CARDIAC CATHETERIZATION  2014   CARPAL TUNNEL RELEASE Right 02/11/2016   Procedure: CARPAL TUNNEL RELEASE;  Surgeon: [name], MD;  Location: [facility];  Service: Orthopedics;  Laterality: Right;   CATARACT EXTRACTION W/ INTRAOCULAR LENS IMPLANT Bilateral 2015   CHOLECYSTECTOMY     EUS N/A 05/31/2012   Procedure: UPPER  ENDOSCOPIC ULTRASOUND (EUS) LINEAR;  Surgeon: [name], MD;  Location: [facility];  Service: Endoscopy;  Laterality: N/A;   EXCISIONAL HEMORRHOIDECTOMY     JOINT REPLACEMENT Bilateral    KNEE ARTHROSCOPY WITH LATERAL MENISECTOMY Right 07/07/2015   Procedure: KNEE ARTHROSCOPY WITH LATERAL MENISECTOMY, PARTIAL SYNOVECTOMY;  Surgeon: [name], MD;  Location: [facility];  Service: Orthopedics;  Laterality: Right;   LUMBAR LAMINECTOMY     PORTA CATH INSERTION N/A 01/07/2019   Procedure: PORTA CATH INSERTION;  Surgeon: [name], MD;  Location: [place] [facility];  Service: Cardiovascular;  Laterality: N/A;   REDUCTION MAMMAPLASTY  1990   RIGHT OOPHORECTOMY     TOTAL HIP ARTHROPLASTY Left 05/01/2014   Dr. [name]   TOTAL HIP ARTHROPLASTY Right 08/05/2014   Procedure: TOTAL HIP ARTHROPLASTY ANTERIOR APPROACH;  Surgeon: [name], MD;  Location: [facility];  Service: Orthopedics;  Laterality: Right;   ULNAR NERVE TRANSPOSITION Right 02/11/2016   Procedure: ULNAR NERVE DECOMPRESSION/TRANSPOSITION;  Surgeon: [name], MD;  Location: [facility];  Service: Orthopedics;  Laterality: Right;   VIDEO BRONCHOSCOPY WITH ENDOBRONCHIAL ULTRASOUND Left 12/19/2018   Procedure: VIDEO BRONCHOSCOPY  WITH ENDOBRONCHIAL ULTRASOUND, LEFT, SLEEP APNEA;  Surgeon: [name], MD;  Location: [facility];  Service: Thoracic;  Laterality: Left;   VIDEO BRONCHOSCOPY WITH ENDOBRONCHIAL ULTRASOUND Right 05/14/2021   Procedure: VIDEO BRONCHOSCOPY WITH ENDOBRONCHIAL ULTRASOUND;  Surgeon: Tyler Pita, MD;  Location: [facility] OR[facility];  Service: Pulmonary;  Laterality: Right;    Social History   Socioeconomic History   Marital status: Married    Spouse name: Not on file   Number of children: Not on file   Years of education: Not on file   Highest education level: Not on file  Occupational History   Not on file  Tobacco Use   Smoking status: Former    Packs/day: 1.00    Years: 40.00    Total pack  years: 40.00    Types: Cigarettes    Quit date: 06/13/2007    Years since quitting: 14.3   Smokeless tobacco: Never  Vaping Use   Vaping Use: Never used  Substance and Sexual Activity   Alcohol use: No    Alcohol/week: 0.0 standard drinks of alcohol   Drug use: No   Sexual activity: Not on file  Other Topics Concern   Not on file  Social History Narrative   Not on file   Social Determinants of Health   Financial Resource Strain: Not on file  Food Insecurity: Not on file  Transportation Needs: Not on file  Physical Activity: Not on file  Stress: Not on file  Social Connections: Not on file  Intimate Partner Violence: Not on file    Family History  Problem Relation Age of Onset   Heart disease Mother        s/p stent   Hypertension Mother    Hypercholesterolemia Mother    Diabetes Father    Stomach cancer Other        uncle   Breast cancer Cousin    Breast cancer Paternal Aunt      Current Outpatient Medications:    acetaminophen (TYLENOL) 500 MG tablet, Take 500 mg by mouth 2 (two) times daily., Disp: , Rfl:    albuterol (VENTOLIN HFA) 108 (90 Base) MCG/ACT inhaler, Inhale 2 puffs into the lungs every 6 (six) hours as needed for wheezing or shortness of breath., Disp: , Rfl:    Bioflavonoid Products (ESTER C PO), Take 1 tablet by mouth 3 (three) times daily. 500 mg, Disp: , Rfl:    Calcium-Magnesium-Zinc (CAL-MAG-ZINC PO), Take 1 tablet by mouth daily., Disp: , Rfl:    carboxymethylcellulose (REFRESH PLUS) 0.5 % SOLN, Place 1 drop into both eyes 3 (three) times daily as needed (dry eyes)., Disp: , Rfl:    cetirizine (ZYRTEC) 5 MG tablet, Take 1 tablet (5 mg total) by mouth daily., Disp: 30 tablet, Rfl: 0   chlorhexidine (PERIDEX) 0.12 % solution, SMARTSIG:By Mouth, Disp: , Rfl:    Cholecalciferol (EQL VITAMIN D3) 25 MCG (1000 UT) capsule, Take 1,000 Units by mouth daily., Disp: , Rfl:    Coenzyme Q10 (CO Q 10) 100 MG CAPS, Take 100 mg by mouth daily. , Disp: , Rfl:     Cyanocobalamin (VITAMIN B-12) 2500 MCG SUBL, Place 2,500 mcg under the tongue daily., Disp: , Rfl:    cyclobenzaprine (FLEXERIL) 10 MG tablet, Take 10 mg by mouth at bedtime., Disp: , Rfl:    dexamethasone (DECADRON) 4 MG tablet, Take 1 tab every 12 hours the day before pemetrexed chemo, then take 2 tabs once a day for 3 days starting the day after  carboplatin., Disp: 30 tablet, Rfl: 1   DHEA 25 MG CAPS, Take 25 mg by mouth daily., Disp: , Rfl:    ezetimibe (ZETIA) 10 MG tablet, Take 5 mg by mouth in the morning and at bedtime., Disp: , Rfl:    folic acid (FOLVITE) 1 MG tablet, Take 1 tablet (1 mg total) by mouth daily. Start 7 days before pemetrexed chemotherapy. Continue until 21 days after pemetrexed completed., Disp: 100 tablet, Rfl: 3   folic acid (FOLVITE) 914 MCG tablet, Take 400 mcg by mouth daily., Disp: , Rfl:    Garlic 7829 MG CAPS, Take 1,000 mg by mouth daily., Disp: , Rfl:    Histamine Dihydrochloride (AUSTRALIAN DREAM ARTHRITIS) 0.025 % CREA, Apply 1 application. topically 4 (four) times daily as needed (pain)., Disp: , Rfl:    HYDROcodone-acetaminophen (NORCO/VICODIN) 5-325 MG tablet, 1-2 tabs po bid prn (Patient taking differently: Take 1 tablet by mouth at bedtime.), Disp: 10 tablet, Rfl: 0   levOCARNitine (L-CARNITINE) 250 MG TABS, Take 1 tablet by mouth daily. With Chromium 250 mg, Disp: , Rfl:    lidocaine-prilocaine (EMLA) cream, Apply to affected area once, Disp: 30 g, Rfl: 3   meclizine (ANTIVERT) 25 MG tablet, Take 1 tablet (25 mg total) by mouth every 6 (six) hours as needed for dizziness., Disp: 30 tablet, Rfl: 0   ondansetron (ZOFRAN) 8 MG tablet, Take 1 tablet (8 mg total) by mouth 2 (two) times daily as needed (Nausea or vomiting). Start if needed on the third day after carboplatin., Disp: 30 tablet, Rfl: 1   pantoprazole (PROTONIX) 40 MG tablet, Take 40 mg by mouth 2 (two) times daily., Disp: , Rfl:    polyethylene glycol powder (GLYCOLAX/MIRALAX) 17 GM/SCOOP powder,  Take 17 g by mouth daily as needed for moderate constipation., Disp: , Rfl:    pyridOXINE (VITAMIN B-6) 100 MG tablet, Take 100 mg by mouth daily., Disp: , Rfl:    Selenium 200 MCG CAPS, Take 200 mcg by mouth daily., Disp: , Rfl:    Sennosides (SENNA) 8.6 MG CAPS, Take 1 capsule by mouth daily as needed (constipation)., Disp: , Rfl:    sucralfate (CARAFATE) 1 GM/10ML suspension, Take 1 g by mouth daily., Disp: , Rfl:    Tiotropium Bromide-Olodaterol (STIOLTO RESPIMAT) 2.5-2.5 MCG/ACT AERS, Inhale 2 puffs into the lungs daily., Disp: 4 g, Rfl: 10   tobramycin-dexamethasone (TOBRADEX) ophthalmic solution, SMARTSIG:1 Drop(s) In Eye(s) 2-3 Times Daily PRN, Disp: , Rfl:    triamcinolone (NASACORT) 55 MCG/ACT AERO nasal inhaler, Place 2 sprays into the nose daily., Disp: , Rfl:    UBRELVY 100 MG TABS, Take 100 mg by mouth daily as needed (migraines)., Disp: , Rfl:    Vitamin A 2400 MCG (8000 UT) CAPS, Take 8,000 Units by mouth daily., Disp: , Rfl:    Vitamin E 200 units TABS, Take 200 Units by mouth daily., Disp: , Rfl:    gabapentin (NEURONTIN) 100 MG capsule, Take 100 mg by mouth 3 (three) times daily. (Patient not taking: Reported on 10/06/2021), Disp: , Rfl:  No current facility-administered medications for this visit.  Facility-Administered Medications Ordered in Other Visits:    cyanocobalamin (VITAMIN B12) injection 1,000 mcg, 1,000 mcg, Intramuscular, Once, Burns, Anderson Malta E, NP   heparin lock flush 100 unit/mL, 500 Units, Intracatheter, Once PRN, Sindy Guadeloupe, MD   PEMEtrexed (ALIMTA) 900 mg in sodium chloride 0.9 % 100 mL chemo infusion, 500 mg/m2 (Treatment Plan Recorded), Intravenous, Once, Sindy Guadeloupe, MD  Physical exam:  Vitals:   10/06/21 1313  BP: 126/70  Pulse: 96  Resp: 18  Temp: 98 F (36.7 C)  SpO2: 98%  Weight: 169 lb 6.4 oz (76.8 kg)   Physical Exam Constitutional:      General: She is not in acute distress. Cardiovascular:     Rate and Rhythm: Normal rate and  regular rhythm.     Heart sounds: Normal heart sounds.  Pulmonary:     Effort: Pulmonary effort is normal.     Comments: Scattered b/l rhonchi Abdominal:     General: Bowel sounds are normal.     Palpations: Abdomen is soft.  Skin:    General: Skin is warm and dry.  Neurological:     Mental Status: She is alert and oriented to person, place, and time.         Latest Ref Rng & Units 10/06/2021   12:53 PM  CMP  Glucose 70 - 99 mg/dL 103   BUN 8 - 23 mg/dL 11   Creatinine 0.44 - 1.00 mg/dL 0.48   Sodium 135 - 145 mmol/L 139   Potassium 3.5 - 5.1 mmol/L 4.1   Chloride 98 - 111 mmol/L 104   CO2 22 - 32 mmol/L 26   Calcium 8.9 - 10.3 mg/dL 9.1   Total Protein 6.5 - 8.1 g/dL 6.4   Total Bilirubin 0.3 - 1.2 mg/dL 0.5   Alkaline Phos 38 - 126 U/L 88   AST 15 - 41 U/L 34   ALT 0 - 44 U/L 22       Latest Ref Rng & Units 10/06/2021   12:53 PM  CBC  WBC 4.0 - 10.5 K/uL 4.4   Hemoglobin 12.0 - 15.0 g/dL 11.9   Hematocrit 36.0 - 46.0 % 36.1   Platelets 150 - 400 K/uL 278    Assessment and plan- Patient is a 76 y.o. 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  She is here for on treatment assessment prior to cycle 3 of maintenance Alimta  Counts okay to proceed with cycle 3 of maintenance Alimta today.  She will be seen by covering NP in 3 weeks for cycle 4 and I will see her back in 6 weeks for cycle 5.  We will obtain repeat CT chest abdomen and pelvis with contrast in 4 to 5 weeks time.  Patient will be receiving B12 injection every 9 weeks and will receive it with the next cycle.  Normocytic anemia: Hemoglobin is currently stable between 10-11.  If there is further drop in her hemoglobin or evidence of AKI which can be seen with Alimta I will consider giving her a break from Alimta at that time.   Visit Diagnosis 1. Malignant neoplasm of upper lobe of left lung ([place])   2. Encounter for antineoplastic chemotherapy      Dr. Randa Evens, MD, MPH [facility] at  [facility] [phone] 10/06/2021 2:11 PM

## 2021-10-07 ENCOUNTER — Encounter: Payer: Self-pay | Admitting: Oncology

## 2021-10-13 ENCOUNTER — Telehealth: Payer: Self-pay | Admitting: *Deleted

## 2021-10-13 NOTE — Telephone Encounter (Signed)
Pt left message stating that she has noticed her heart rate to be elevated ranging between 105-115 and has noticed her blood pressure is elevated at 152/72. Pt is asking if her symptoms could be related to her treatment.   Please advise.

## 2021-10-13 NOTE — Telephone Encounter (Signed)
Pt made aware of recommendations. Pt states that her BP and HR have been fluctuating throughout the day for the past several months but has not consistently stayed elevated. Encouraged pt to check her BP twice a day and keep a log of the readings to bring with her to her next appt for further review. Informed pt that if her BP and HR remained elevated then will need to seek evaluation at urgent care or ER. Pt verbalized understanding.

## 2021-10-13 NOTE — Telephone Encounter (Signed)
I dont think so. If her heart rate remains elevated she should get it checked out with [facility] or urgent care or ER

## 2021-10-26 MED FILL — Dexamethasone Sodium Phosphate Inj 100 MG/10ML: INTRAMUSCULAR | Qty: 1 | Status: AC

## 2021-10-27 ENCOUNTER — Inpatient Hospital Stay: Payer: Medicare Other

## 2021-10-27 ENCOUNTER — Inpatient Hospital Stay: Payer: Medicare Other | Attending: Oncology

## 2021-10-27 DIAGNOSIS — Z5111 Encounter for antineoplastic chemotherapy: Secondary | ICD-10-CM | POA: Insufficient documentation

## 2021-10-27 DIAGNOSIS — C3411 Malignant neoplasm of upper lobe, right bronchus or lung: Secondary | ICD-10-CM | POA: Insufficient documentation

## 2021-10-27 DIAGNOSIS — C3412 Malignant neoplasm of upper lobe, left bronchus or lung: Secondary | ICD-10-CM

## 2021-10-27 LAB — VITAMIN B12: Vitamin B-12: 1136 pg/mL — ABNORMAL HIGH (ref 180–914)

## 2021-10-27 LAB — COMPREHENSIVE METABOLIC PANEL
ALT: 22 U/L (ref 0–44)
AST: 28 U/L (ref 15–41)
Albumin: 3.7 g/dL (ref 3.5–5.0)
Alkaline Phosphatase: 85 U/L (ref 38–126)
Anion gap: 7 (ref 5–15)
BUN: 10 mg/dL (ref 8–23)
CO2: 26 mmol/L (ref 22–32)
Calcium: 8.7 mg/dL — ABNORMAL LOW (ref 8.9–10.3)
Chloride: 105 mmol/L (ref 98–111)
Creatinine, Ser: 0.48 mg/dL (ref 0.44–1.00)
GFR, Estimated: >60 mL/min (ref >60)
Glucose, Bld: 97 mg/dL (ref 70–99)
Potassium: 4 mmol/L (ref 3.5–5.1)
Sodium: 138 mmol/L (ref 135–145)
Total Bilirubin: 0.6 mg/dL (ref 0.3–1.2)
Total Protein: 6.5 g/dL (ref 6.5–8.1)

## 2021-10-27 LAB — CBC WITH DIFFERENTIAL/PLATELET
Abs Immature Granulocytes: 0.03 10*3/uL (ref 0.00–0.07)
Basophils Absolute: 0 10*3/uL (ref 0.0–0.1)
Basophils Relative: 1 %
Eosinophils Absolute: 0.1 10*3/uL (ref 0.0–0.5)
Eosinophils Relative: 3 %
HCT: 35.8 % — ABNORMAL LOW (ref 36.0–46.0)
Hemoglobin: 11.8 g/dL — ABNORMAL LOW (ref 12.0–15.0)
Immature Granulocytes: 1 %
Lymphocytes Relative: 12 %
Lymphs Abs: 0.5 10*3/uL — ABNORMAL LOW (ref 0.7–4.0)
MCH: 31.8 pg (ref 26.0–34.0)
MCHC: 33 g/dL (ref 30.0–36.0)
MCV: 96.5 fL (ref 80.0–100.0)
Monocytes Absolute: 0.4 10*3/uL (ref 0.1–1.0)
Monocytes Relative: 10 %
Neutro Abs: 2.8 10*3/uL (ref 1.7–7.7)
Neutrophils Relative %: 73 %
Platelets: 285 10*3/uL (ref 150–400)
RBC: 3.71 MIL/uL — ABNORMAL LOW (ref 3.87–5.11)
RDW: 12.7 % (ref 11.5–15.5)
WBC: 3.9 10*3/uL — ABNORMAL LOW (ref 4.0–10.5)
nRBC: 0 % (ref 0.0–0.2)

## 2021-10-27 MED ORDER — SODIUM CHLORIDE 0.9% FLUSH
10.0000 mL | Freq: Once | INTRAVENOUS | Status: AC
  Administered 2021-10-27: 10 mL via INTRAVENOUS
  Filled 2021-10-27: qty 10

## 2021-10-27 MED ORDER — HEPARIN SOD (PORK) LOCK FLUSH 100 UNIT/ML IV SOLN
500.0000 [IU] | Freq: Once | INTRAVENOUS | Status: AC | PRN
  Administered 2021-10-27: 500 [IU]
  Filled 2021-10-27: qty 5

## 2021-10-27 MED ORDER — SODIUM CHLORIDE 0.9 % IV SOLN
10.0000 mg | Freq: Once | INTRAVENOUS | Status: AC
  Administered 2021-10-27: 10 mg via INTRAVENOUS
  Filled 2021-10-27: qty 10

## 2021-10-27 MED ORDER — SODIUM CHLORIDE 0.9 % IV SOLN
Freq: Once | INTRAVENOUS | Status: AC
  Filled 2021-10-27: qty 250

## 2021-10-27 MED ORDER — SODIUM CHLORIDE 0.9 % IV SOLN
500.0000 mg/m2 | Freq: Once | INTRAVENOUS | Status: AC
  Administered 2021-10-27: 900 mg via INTRAVENOUS
  Filled 2021-10-27: qty 36

## 2021-10-27 NOTE — Patient Instructions (Signed)
[facility] AT [place]  Discharge Instructions: Thank you for choosing [facility] to provide your oncology and hematology care.  If you have a lab appointment with the [facility], please go directly to the [facility] and check in at the registration area.  Wear comfortable clothing and clothing appropriate for easy access to any Portacath or PICC line.   We strive to give you quality time with your provider. You may need to reschedule your appointment if you arrive late (15 or more minutes).  Arriving late affects you and other patients whose appointments are after yours.  Also, if you miss three or more appointments without notifying the office, you may be dismissed from the clinic at the provider's discretion.      For prescription refill requests, have your pharmacy contact our office and allow 72 hours for refills to be completed.    Today you received the following chemotherapy and/or immunotherapy agents: PEMEtrexed    To help prevent nausea and vomiting after your treatment, we encourage you to take your nausea medication as directed.  BELOW ARE SYMPTOMS THAT SHOULD BE REPORTED IMMEDIATELY: *FEVER GREATER THAN 100.4 F (38 C) OR HIGHER *CHILLS OR SWEATING *NAUSEA AND VOMITING THAT IS NOT CONTROLLED WITH YOUR NAUSEA MEDICATION *UNUSUAL SHORTNESS OF BREATH *UNUSUAL BRUISING OR BLEEDING *URINARY PROBLEMS (pain or burning when urinating, or frequent urination) *BOWEL PROBLEMS (unusual diarrhea, constipation, pain near the anus) TENDERNESS IN MOUTH AND THROAT WITH OR WITHOUT PRESENCE OF ULCERS (sore throat, sores in mouth, or a toothache) UNUSUAL RASH, SWELLING OR PAIN  UNUSUAL VAGINAL DISCHARGE OR ITCHING   Items with * indicate a potential emergency and should be followed up as soon as possible or go to the Emergency Department if any problems should occur.  Please show the CHEMOTHERAPY ALERT CARD or IMMUNOTHERAPY ALERT CARD at check-in to  the Emergency Department and triage nurse.  Should you have questions after your visit or need to cancel or reschedule your appointment, please contact [facility] [place] AT [place]  [phone] and follow the prompts.  Office hours are 8:00 a.m. to 4:30 p.m. Monday - Friday. Please note that voicemails left after 4:00 p.m. may not be returned until the following business day.  We are closed weekends and major holidays. You have access to a nurse at all times for urgent questions. Please call the main number to the clinic [phone] and follow the prompts.  For any non-urgent questions, you may also contact your provider using MyChart. We now offer e-Visits for anyone 48 and older to request care online for non-urgent symptoms. For details visit [email or web address].   Also download the MyChart app! Go to the app store, search "MyChart", open the app, select [facility], and log in with your MyChart username and password.  Masks are optional in the cancer centers. If you would like for your care team to wear a mask while they are taking care of you, please let them know. For doctor visits, patients may have with them one support person who is at least 77 years old. At this time, visitors are not allowed in the infusion area.

## 2021-11-11 ENCOUNTER — Ambulatory Visit
Admission: RE | Admit: 2021-11-11 | Discharge: 2021-11-11 | Disposition: A | Discharge status: Home / Self Care | Payer: Medicare Other | Source: Ambulatory Visit | Attending: Oncology | Admitting: Oncology

## 2021-11-11 DIAGNOSIS — C3412 Malignant neoplasm of upper lobe, left bronchus or lung: Secondary | ICD-10-CM | POA: Insufficient documentation

## 2021-11-11 MED ORDER — IOHEXOL 300 MG/ML  SOLN
100.0000 mL | Freq: Once | INTRAMUSCULAR | Status: AC | PRN
  Administered 2021-11-11: 100 mL via INTRAVENOUS

## 2021-11-14 ENCOUNTER — Other Ambulatory Visit: Payer: Self-pay | Admitting: Oncology

## 2021-11-14 DIAGNOSIS — C3412 Malignant neoplasm of upper lobe, left bronchus or lung: Secondary | ICD-10-CM

## 2021-11-14 NOTE — Progress Notes (Signed)
OFF PATHWAY REGIMEN - Non-Small Cell Lung  No Change  Continue With Treatment as Ordered.  Original Decision Date/Time: 05/21/2021 12:21   [id number]:Carboplatin AUC=5 + Pemetrexed 500 mg/m2 q21 Days:   A cycle is every 21 days:     Pemetrexed      Carboplatin   **Always confirm dose/schedule in your pharmacy ordering system**  Patient Characteristics: Local Recurrence Therapeutic Status: Local Recurrence Intent of Therapy: Non-Curative / Palliative Intent, Discussed with Patient

## 2021-11-16 ENCOUNTER — Ambulatory Visit: Payer: Medicare Other | Admitting: Oncology

## 2021-11-16 ENCOUNTER — Ambulatory Visit: Payer: Medicare Other

## 2021-11-16 ENCOUNTER — Other Ambulatory Visit: Payer: Medicare Other

## 2021-11-16 MED FILL — Dexamethasone Sodium Phosphate Inj 100 MG/10ML: INTRAMUSCULAR | Qty: 1 | Status: AC

## 2021-11-16 MED FILL — Fosaprepitant Dimeglumine For IV Infusion 150 MG (Base Eq): INTRAVENOUS | Qty: 5 | Status: AC

## 2021-11-17 ENCOUNTER — Encounter: Payer: Self-pay | Admitting: Oncology

## 2021-11-17 ENCOUNTER — Inpatient Hospital Stay: Payer: Medicare Other | Attending: Oncology

## 2021-11-17 ENCOUNTER — Inpatient Hospital Stay (HOSPITAL_BASED_OUTPATIENT_CLINIC_OR_DEPARTMENT_OTHER): Payer: Medicare Other | Admitting: Oncology

## 2021-11-17 ENCOUNTER — Inpatient Hospital Stay: Payer: Medicare Other

## 2021-11-17 VITALS — BP 143/73 | HR 89 | Temp 97.9°F | Resp 18 | Wt 172.9 lb

## 2021-11-17 DIAGNOSIS — C3411 Malignant neoplasm of upper lobe, right bronchus or lung: Secondary | ICD-10-CM | POA: Insufficient documentation

## 2021-11-17 DIAGNOSIS — R918 Other nonspecific abnormal finding of lung field: Secondary | ICD-10-CM | POA: Diagnosis not present

## 2021-11-17 DIAGNOSIS — R9389 Abnormal findings on diagnostic imaging of other specified body structures: Secondary | ICD-10-CM

## 2021-11-17 DIAGNOSIS — Z5111 Encounter for antineoplastic chemotherapy: Secondary | ICD-10-CM

## 2021-11-17 DIAGNOSIS — C3412 Malignant neoplasm of upper lobe, left bronchus or lung: Secondary | ICD-10-CM | POA: Diagnosis not present

## 2021-11-17 LAB — CBC WITH DIFFERENTIAL/PLATELET
Abs Immature Granulocytes: 0.04 10*3/uL (ref 0.00–0.07)
Basophils Absolute: 0 10*3/uL (ref 0.0–0.1)
Basophils Relative: 0 %
Eosinophils Absolute: 0 10*3/uL (ref 0.0–0.5)
Eosinophils Relative: 0 %
HCT: 34.7 % — ABNORMAL LOW (ref 36.0–46.0)
Hemoglobin: 11.6 g/dL — ABNORMAL LOW (ref 12.0–15.0)
Immature Granulocytes: 1 %
Lymphocytes Relative: 8 %
Lymphs Abs: 0.5 10*3/uL — ABNORMAL LOW (ref 0.7–4.0)
MCH: 31.5 pg (ref 26.0–34.0)
MCHC: 33.4 g/dL (ref 30.0–36.0)
MCV: 94.3 fL (ref 80.0–100.0)
Monocytes Absolute: 0.6 10*3/uL (ref 0.1–1.0)
Monocytes Relative: 8 %
Neutro Abs: 6 10*3/uL (ref 1.7–7.7)
Neutrophils Relative %: 83 %
Platelets: 374 10*3/uL (ref 150–400)
RBC: 3.68 MIL/uL — ABNORMAL LOW (ref 3.87–5.11)
RDW: 13.3 % (ref 11.5–15.5)
WBC: 7.2 10*3/uL (ref 4.0–10.5)
nRBC: 0 % (ref 0.0–0.2)

## 2021-11-17 LAB — COMPREHENSIVE METABOLIC PANEL
ALT: 20 U/L (ref 0–44)
AST: 34 U/L (ref 15–41)
Albumin: 4.3 g/dL (ref 3.5–5.0)
Alkaline Phosphatase: 99 U/L (ref 38–126)
Anion gap: 8 (ref 5–15)
BUN: 13 mg/dL (ref 8–23)
CO2: 25 mmol/L (ref 22–32)
Calcium: 9.1 mg/dL (ref 8.9–10.3)
Chloride: 104 mmol/L (ref 98–111)
Creatinine, Ser: 0.57 mg/dL (ref 0.44–1.00)
GFR, Estimated: >60 mL/min (ref >60)
Glucose, Bld: 115 mg/dL — ABNORMAL HIGH (ref 70–99)
Potassium: 4.3 mmol/L (ref 3.5–5.1)
Sodium: 137 mmol/L (ref 135–145)
Total Bilirubin: 0.4 mg/dL (ref 0.3–1.2)
Total Protein: 7.1 g/dL (ref 6.5–8.1)

## 2021-11-17 MED ORDER — SODIUM CHLORIDE 0.9 % IV SOLN
500.0000 mg/m2 | Freq: Once | INTRAVENOUS | Status: AC
  Administered 2021-11-17: 900 mg via INTRAVENOUS
  Filled 2021-11-17: qty 20

## 2021-11-17 MED ORDER — SODIUM CHLORIDE 0.9 % IV SOLN
10.0000 mg | Freq: Once | INTRAVENOUS | Status: AC
  Administered 2021-11-17: 10 mg via INTRAVENOUS
  Filled 2021-11-17: qty 10

## 2021-11-17 MED ORDER — PALONOSETRON HCL INJECTION 0.25 MG/5ML: 0.2500 mg | Freq: Once | INTRAVENOUS | Status: DC

## 2021-11-17 MED ORDER — SODIUM CHLORIDE 0.9 % IV SOLN
Freq: Once | INTRAVENOUS | Status: AC
  Filled 2021-11-17: qty 250

## 2021-11-17 MED ORDER — SODIUM CHLORIDE 0.9 % IV SOLN
150.0000 mg | Freq: Once | INTRAVENOUS | Status: DC
  Filled 2021-11-17: qty 5

## 2021-11-17 MED ORDER — CYANOCOBALAMIN 1000 MCG/ML IJ SOLN
1000.0000 ug | Freq: Once | INTRAMUSCULAR | Status: AC
  Administered 2021-11-17: 1000 ug via INTRAMUSCULAR
  Filled 2021-11-17: qty 1

## 2021-11-17 MED ORDER — HEPARIN SOD (PORK) LOCK FLUSH 100 UNIT/ML IV SOLN
500.0000 [IU] | Freq: Once | INTRAVENOUS | Status: AC | PRN
  Administered 2021-11-17: 500 [IU]
  Filled 2021-11-17: qty 5

## 2021-11-17 MED ORDER — AMOXICILLIN-POT CLAVULANATE 875-125 MG PO TABS: 1.0000 | ORAL_TABLET | Freq: Two times a day (BID) | ORAL | 0 refills | Status: AC

## 2021-11-17 NOTE — Patient Instructions (Signed)
[facility] AT [place]  Discharge Instructions: Thank you for choosing [facility] to provide your oncology and hematology care.  If you have a lab appointment with the [facility], please go directly to the [facility] and check in at the registration area.  Wear comfortable clothing and clothing appropriate for easy access to any Portacath or PICC line.   We strive to give you quality time with your provider. You may need to reschedule your appointment if you arrive late (15 or more minutes).  Arriving late affects you and other patients whose appointments are after yours.  Also, if you miss three or more appointments without notifying the office, you may be dismissed from the clinic at the provider's discretion.      For prescription refill requests, have your pharmacy contact our office and allow 72 hours for refills to be completed.    Today you received the following chemotherapy and/or immunotherapy agents ALIMTA      To help prevent nausea and vomiting after your treatment, we encourage you to take your nausea medication as directed.  BELOW ARE SYMPTOMS THAT SHOULD BE REPORTED IMMEDIATELY: *FEVER GREATER THAN 100.4 F (38 C) OR HIGHER *CHILLS OR SWEATING *NAUSEA AND VOMITING THAT IS NOT CONTROLLED WITH YOUR NAUSEA MEDICATION *UNUSUAL SHORTNESS OF BREATH *UNUSUAL BRUISING OR BLEEDING *URINARY PROBLEMS (pain or burning when urinating, or frequent urination) *BOWEL PROBLEMS (unusual diarrhea, constipation, pain near the anus) TENDERNESS IN MOUTH AND THROAT WITH OR WITHOUT PRESENCE OF ULCERS (sore throat, sores in mouth, or a toothache) UNUSUAL RASH, SWELLING OR PAIN  UNUSUAL VAGINAL DISCHARGE OR ITCHING   Items with * indicate a potential emergency and should be followed up as soon as possible or go to the Emergency Department if any problems should occur.  Please show the CHEMOTHERAPY ALERT CARD or IMMUNOTHERAPY ALERT CARD at check-in to the  Emergency Department and triage nurse.  Should you have questions after your visit or need to cancel or reschedule your appointment, please contact [facility] [place] [facility] [place]  [phone] and follow the prompts.  Office hours are 8:00 a.m. to 4:30 p.m. Monday - Friday. Please note that voicemails left after 4:00 p.m. may not be returned until the following business day.  We are closed weekends and major holidays. You have access to a nurse at all times for urgent questions. Please call the main number to the clinic [phone] and follow the prompts.  For any non-urgent questions, you may also contact your provider using MyChart. We now offer e-Visits for anyone 85 and older to request care online for non-urgent symptoms. For details visit [email or web address].   Also download the MyChart app! Go to the app store, search "MyChart", open the app, select [facility], and log in with your MyChart username and password.  Masks are optional in the cancer centers. If you would like for your care team to wear a mask while they are taking care of you, please let them know. For doctor visits, patients may have with them one support person who is at least 77 years old. At this time, visitors are not allowed in the infusion area.  Pemetrexed Injection What is this medication? PEMETREXED (PEM e TREX ed) treats some types of cancer. It works by slowing down the growth of cancer cells. This medicine may be used for other purposes; ask your health care provider or pharmacist if you have questions. COMMON BRAND NAME(S): Alimta, PEMFEXY What should I tell my care  team before I take this medication? They need to know if you have any of these conditions: Infection, such as chickenpox, cold sores, or herpes Kidney disease Low blood cell levels (white cells, red cells, and platelets) Lung or breathing disease, such as asthma Radiation therapy An unusual or allergic reaction to pemetrexed,  other medications, foods, dyes, or preservatives If you or your partner are pregnant or trying to get pregnant Breast-feeding How should I use this medication? This medication is injected into a vein. It is given by your care team in a hospital or clinic setting. Talk to your care team about the use of this medication in children. Special care may be needed. Overdosage: If you think you have taken too much of this medicine contact a poison control center or emergency room at once. NOTE: This medicine is only for you. Do not share this medicine with others. What if I miss a dose? Keep appointments for follow-up doses. It is important not to miss your dose. Call your care team if you are unable to keep an appointment. What may interact with this medication? Do not take this medication with any of the following: Live virus vaccines This medication may also interact with the following: Ibuprofen This list may not describe all possible interactions. Give your health care provider a list of all the medicines, herbs, non-prescription drugs, or dietary supplements you use. Also tell them if you smoke, drink alcohol, or use illegal drugs. Some items may interact with your medicine. What should I watch for while using this medication? Your condition will be monitored carefully while you are receiving this medication. This medication may make you feel generally unwell. This is not uncommon as chemotherapy can affect healthy cells as well as cancer cells. Report any side effects. Continue your course of treatment even though you feel ill unless your care team tells you to stop. This medication can cause serious side effects. To reduce the risk, your care team may give you other medications to take before receiving this one. Be sure to follow the directions from your care team. This medication can cause a rash or redness in areas of the body that have previously had radiation therapy. If you have had  radiation therapy, tell your care team if you notice a rash in this area. This medication may increase your risk of getting an infection. Call your care team for advice if you get a fever, chills, sore throat, or other symptoms of a cold or flu. Do not treat yourself. Try to avoid being around people who are sick. Be careful brushing or flossing your teeth or using a toothpick because you may get an infection or bleed more easily. If you have any dental work done, tell your dentist you are receiving this medication. Avoid taking medications that contain aspirin, acetaminophen, ibuprofen, naproxen, or ketoprofen unless instructed by your care team. These medications may hide a fever. Check with your care team if you have severe diarrhea, nausea, and vomiting, or if you sweat a lot. The loss of too much body fluid may make it dangerous for you to take this medication. Talk to your care team if you or your partner wish to become pregnant or think either of you might be pregnant. This medication can cause serious birth defects if taken during pregnancy and for 6 months after the last dose. A negative pregnancy test is required before starting this medication. A reliable form of contraception is recommended while taking this medication and  for 6 months after the last dose. Talk to your care team about reliable forms of contraception. Do not father a child while taking this medication and for 3 months after the last dose. Use a condom while having sex during this time period. Do not breastfeed while taking this medication and for 1 week after the last dose. This medication may cause infertility. Talk to your care team if you are concerned about your fertility. What side effects may I notice from receiving this medication? Side effects that you should report to your care team as soon as possible: Allergic reactions--skin rash, itching, hives, swelling of the face, lips, tongue, or throat Dry cough, shortness of  breath or trouble breathing Infection--fever, chills, cough, sore throat, wounds that don't heal, pain or trouble when passing urine, general feeling of discomfort or being unwell Kidney injury--decrease in the amount of urine, swelling of the ankles, hands, or feet Low red blood cell level--unusual weakness or fatigue, dizziness, headache, trouble breathing Redness, blistering, peeling, or loosening of the skin, including inside the mouth Unusual bruising or bleeding Side effects that usually do not require medical attention (report to your care team if they continue or are bothersome): Fatigue Loss of appetite Nausea Vomiting This list may not describe all possible side effects. Call your doctor for medical advice about side effects. You may report side effects to FDA at 1-800-FDA-1088. Where should I keep my medication? This medication is given in a hospital or clinic. It will not be stored at home. NOTE: This sheet is a summary. It may not cover all possible information. If you have questions about this medicine, talk to your doctor, pharmacist, or health care provider.  2023 [employer] Standard (2021-07-05 00:00:00)

## 2021-11-17 NOTE — Progress Notes (Signed)
Pt states she has noticed swelling in her feet and ankle for the past two weeks.  Has soaked her feet in epsom salt and swelling went down some, but legs and knees feel very heavy. Also, noticed BP and pulse has been elevated.

## 2021-11-18 ENCOUNTER — Encounter: Payer: Self-pay | Admitting: Oncology

## 2021-11-18 NOTE — Progress Notes (Signed)
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  EGFR and Ros as well as MET testing negative. PD-L1 was 50%   Patient completed concurrent chemoradiation with weekly carbotaxol chemotherapy on 03/12/2019.  Maintenance durvalumab started in January 2021 after scan showed partial response.  Patient completed 1 year of maintenance durvalumab in February 2022   Patient was found to have enlarging mediastinal adenopathyOn subsequent CT scan in January 2023.  This was followed by a PET scan which showed hypermetabolic mediastinal and right hilar lymph nodes but no other evidence of disease elsewhere.  This was biopsied and was consistent with non-small cell lung cancer specifically adenocarcinoma.  Cells were positive for TTF-1 and Napsin A and negative for p40.   Patient completed concurrent chemoradiation with 4 cycles of carbo Alimta ending in May 2023.  Scan showed partial response.  Plan is for maintenance Alimta until progression or toxicity     Interval history-patient has baseline cough and occasional productive sputum.  Denies any changes in her  nature of cough.  Denies any fever or shortness of breath  ECOG PS- 1 Pain scale- 0   Review of systems- Review of Systems  Constitutional:  Positive for malaise/fatigue. Negative for chills, fever and weight loss.  HENT:  Negative for congestion, ear discharge and nosebleeds.   Eyes:  Negative for blurred vision.  Respiratory:  Positive for cough. Negative for hemoptysis, sputum production, shortness of breath and wheezing.   Cardiovascular:  Negative for chest pain, palpitations, orthopnea and claudication.  Gastrointestinal:  Negative for abdominal pain, blood in stool, constipation, diarrhea, heartburn, melena, nausea and vomiting.  Genitourinary:  Negative for dysuria, flank pain, frequency, hematuria and urgency.  Musculoskeletal:  Negative for back pain, joint pain and myalgias.  Skin:  Negative for rash.  Neurological:  Negative for dizziness, tingling, focal weakness, seizures, weakness and headaches.  Endo/Heme/Allergies:  Does not bruise/bleed easily.  Psychiatric/Behavioral:  Negative for depression and suicidal ideas. The patient does not have insomnia.       Allergies  Allergen Reactions   Aspirin Hives, Shortness Of Breath and Other (See Comments)    Difficulty breathing   Celebrex [Celecoxib] Shortness Of Breath   Morphine And Related Nausea And Vomiting and Swelling   Adhesive [Tape] Other (See Comments)    Took top layer of skin off when removed.   Clarithromycin Nausea And Vomiting   Codeine Nausea And Vomiting   Darvon [Propoxyphene Hcl] Nausea And Vomiting   Demerol [Meperidine] Nausea And Vomiting   Flonase [Fluticasone Propionate] Other (See Comments)    Fungal infection in nose   Simvastatin Other (See Comments)    "caused ulcers in mouth, and fever"   Talwin [Pentazocine] Nausea And Vomiting  Past Medical History:  Diagnosis Date   Anemia    Asthma    No Inhalers--Dr. [name] will order as needed   Bronchiectasis ([place])    mild   Chronic  headaches     followed by [facility] migraines   COPD (chronic obstructive pulmonary disease) (HCC)    DDD (degenerative disc disease), lumbar    Diverticulosis    Family history of adverse reaction to anesthesia    mother and sisters-PONV   Gall stones    history of   GERD (gastroesophageal reflux disease)    EGD 8/09- non bleeding erosive gastritis, documentd esophageal ulcerations.    Hiatal hernia    small   History of kidney stones    History of pneumonia    Hypercholesterolemia    IBS (irritable bowel syndrome)    Malignant neoplasm of upper lobe of left lung ([place]) 12/27/2018   Meniere disease    Murmur    Osteoarthritis    lumbar disc disease, left hip   Personal history of chemotherapy    Personal history of radiation therapy    Pneumonia 12/2020   PONV (postoperative nausea and vomiting)    Sleep apnea    uses cpap   Vertigo    Weakness of right side of body      Past Surgical History:  Procedure Laterality Date   ABDOMINAL HYSTERECTOMY  age 16   ANTERIOR CERVICAL DECOMP/DISCECTOMY FUSION N/A 02/24/2015   Procedure: CERVICAL FOUR-FIVE, CERVICAL FIVE-SIX, CERVICAL SIX-SEVEN ANTERIOR CERVICAL DECOMPRESSION/DISCECTOMY FUSION ;  Surgeon: [name], MD;  Location: [facility];  Service: Neurosurgery;  Laterality: N/A;  C45 C56 C67 anterior cervical decompression with fusion interbody prosthesis plating and bonegraft   APPENDECTOMY     BACK SURGERY  1988   4th lumbar fusion   BLADDER SURGERY N/A    with vaginal wall repair   BREAST CYST ASPIRATION Bilateral    neg   BREAST SURGERY Bilateral    cyst removed and reduction   CARDIAC CATHETERIZATION  2014   CARPAL TUNNEL RELEASE Right 02/11/2016   Procedure: CARPAL TUNNEL RELEASE;  Surgeon: [name], MD;  Location: [facility] ORS;  Service: Orthopedics;  Laterality: Right;   CATARACT EXTRACTION W/ INTRAOCULAR LENS IMPLANT Bilateral 2015   CHOLECYSTECTOMY     EUS N/A 05/31/2012   Procedure: UPPER  ENDOSCOPIC ULTRASOUND (EUS) LINEAR;  Surgeon: [name], MD;  Location: [facility];  Service: Endoscopy;  Laterality: N/A;   EXCISIONAL HEMORRHOIDECTOMY     JOINT REPLACEMENT Bilateral    KNEE ARTHROSCOPY WITH LATERAL MENISECTOMY Right 07/07/2015   Procedure: KNEE ARTHROSCOPY WITH LATERAL MENISECTOMY, PARTIAL SYNOVECTOMY;  Surgeon: [name], MD;  Location: [facility] ORS;  Service: Orthopedics;  Laterality: Right;   LUMBAR LAMINECTOMY     PORTA CATH INSERTION N/A 01/07/2019   Procedure: PORTA CATH INSERTION;  Surgeon: [name], MD;  Location: [facility];  Service: Cardiovascular;  Laterality: N/A;   REDUCTION MAMMAPLASTY  1990   RIGHT OOPHORECTOMY     TOTAL HIP ARTHROPLASTY Left 05/01/2014   Dr. [name]   TOTAL HIP ARTHROPLASTY Right 08/05/2014   Procedure: TOTAL HIP ARTHROPLASTY ANTERIOR APPROACH;  Surgeon: [name], MD;  Location: [facility] ORS;  Service: Orthopedics;  Laterality: Right;   ULNAR NERVE TRANSPOSITION Right 02/11/2016   Procedure: ULNAR NERVE DECOMPRESSION/TRANSPOSITION;  Surgeon: [name], MD;  Location: [facility] ORS;  Service: Orthopedics;  Laterality: Right;   VIDEO BRONCHOSCOPY WITH ENDOBRONCHIAL ULTRASOUND Left 12/19/2018   Procedure: VIDEO BRONCHOSCOPY  WITH ENDOBRONCHIAL ULTRASOUND, LEFT, SLEEP APNEA;  Surgeon: [name], MD;  Location: [facility] ORS;  Service: Thoracic;  Laterality: Left;   VIDEO BRONCHOSCOPY WITH ENDOBRONCHIAL ULTRASOUND Right 05/14/2021   Procedure: VIDEO BRONCHOSCOPY WITH ENDOBRONCHIAL ULTRASOUND;  Surgeon: Tyler Pita, MD;  Location: [facility] ORS;  Service: Pulmonary;  Laterality: Right;    Social History   Socioeconomic History   Marital status: Married    Spouse name: Not on file   Number of children: Not on file   Years of education: Not on file   Highest education level: Not on file  Occupational History   Not on file  Tobacco Use   Smoking status: Former    Packs/day: 1.00    Years: 40.00    Total pack  years: 40.00    Types: Cigarettes    Quit date: 06/13/2007    Years since quitting: 14.4   Smokeless tobacco: Never  Vaping Use   Vaping Use: Never used  Substance and Sexual Activity   Alcohol use: No    Alcohol/week: 0.0 standard drinks of alcohol   Drug use: No   Sexual activity: Not on file  Other Topics Concern   Not on file  Social History Narrative   Not on file   Social Determinants of Health   Financial Resource Strain: Not on file  Food Insecurity: Not on file  Transportation Needs: Not on file  Physical Activity: Not on file  Stress: Not on file  Social Connections: Not on file  Intimate Partner Violence: Not on file    Family History  Problem Relation Age of Onset   Heart disease Mother        s/p stent   Hypertension Mother    Hypercholesterolemia Mother    Diabetes Father    Stomach cancer Other        uncle   Breast cancer Cousin    Breast cancer Paternal Aunt      Current Outpatient Medications:    acetaminophen (TYLENOL) 500 MG tablet, Take 500 mg by mouth 2 (two) times daily., Disp: , Rfl:    albuterol (VENTOLIN HFA) 108 (90 Base) MCG/ACT inhaler, Inhale 2 puffs into the lungs every 6 (six) hours as needed for wheezing or shortness of breath., Disp: , Rfl:    amoxicillin-clavulanate (AUGMENTIN) 875-125 MG tablet, Take 1 tablet by mouth 2 (two) times daily for 10 days., Disp: 20 tablet, Rfl: 0   Bioflavonoid Products (ESTER C PO), Take 1 tablet by mouth 3 (three) times daily. 500 mg, Disp: , Rfl:    Calcium-Magnesium-Zinc (CAL-MAG-ZINC PO), Take 1 tablet by mouth daily., Disp: , Rfl:    carboxymethylcellulose (REFRESH PLUS) 0.5 % SOLN, Place 1 drop into both eyes 3 (three) times daily as needed (dry eyes)., Disp: , Rfl:    cetirizine (ZYRTEC) 5 MG tablet, Take 1 tablet (5 mg total) by mouth daily., Disp: 30 tablet, Rfl: 0   Cholecalciferol (EQL VITAMIN D3) 25 MCG (1000 UT) capsule, Take 1,000 Units by mouth daily., Disp: , Rfl:    Coenzyme Q10 (CO Q  10) 100 MG CAPS, Take 100 mg by mouth daily. , Disp: , Rfl:    Cyanocobalamin (VITAMIN B-12) 2500 MCG SUBL, Place 2,500 mcg under the tongue daily., Disp: , Rfl:    cyclobenzaprine (FLEXERIL) 10 MG tablet, Take 10 mg by mouth at bedtime., Disp: , Rfl:    DHEA 25 MG CAPS, Take 25 mg by mouth daily., Disp: , Rfl:    ezetimibe (ZETIA) 10  MG tablet, Take 5 mg by mouth in the morning and at bedtime., Disp: , Rfl:    folic acid (FOLVITE) 132 MCG tablet, Take 400 mcg by mouth daily., Disp: , Rfl:    Garlic 4401 MG CAPS, Take 1,000 mg by mouth daily., Disp: , Rfl:    Histamine Dihydrochloride (AUSTRALIAN DREAM ARTHRITIS) 0.025 % CREA, Apply 1 application. topically 4 (four) times daily as needed (pain)., Disp: , Rfl:    levOCARNitine (L-CARNITINE) 250 MG TABS, Take 1 tablet by mouth daily. With Chromium 250 mg, Disp: , Rfl:    pantoprazole (PROTONIX) 40 MG tablet, Take 40 mg by mouth 2 (two) times daily., Disp: , Rfl:    polyethylene glycol powder (GLYCOLAX/MIRALAX) 17 GM/SCOOP powder, Take 17 g by mouth daily as needed for moderate constipation., Disp: , Rfl:    pyridOXINE (VITAMIN B-6) 100 MG tablet, Take 100 mg by mouth daily., Disp: , Rfl:    Selenium 200 MCG CAPS, Take 200 mcg by mouth daily., Disp: , Rfl:    Sennosides (SENNA) 8.6 MG CAPS, Take 1 capsule by mouth daily as needed (constipation)., Disp: , Rfl:    sucralfate (CARAFATE) 1 GM/10ML suspension, Take 1 g by mouth daily., Disp: , Rfl:    Tiotropium Bromide-Olodaterol (STIOLTO RESPIMAT) 2.5-2.5 MCG/ACT AERS, Inhale 2 puffs into the lungs daily., Disp: 4 g, Rfl: 10   tobramycin-dexamethasone (TOBRADEX) ophthalmic solution, SMARTSIG:1 Drop(s) In Eye(s) 2-3 Times Daily PRN, Disp: , Rfl:    triamcinolone (NASACORT) 55 MCG/ACT AERO nasal inhaler, Place 2 sprays into the nose daily., Disp: , Rfl:    UBRELVY 100 MG TABS, Take 100 mg by mouth daily as needed (migraines)., Disp: , Rfl:    Vitamin A 2400 MCG (8000 UT) CAPS, Take 8,000 Units by mouth  daily., Disp: , Rfl:    Vitamin E 200 units TABS, Take 200 Units by mouth daily., Disp: , Rfl:    chlorhexidine (PERIDEX) 0.12 % solution, SMARTSIG:By Mouth (Patient not taking: Reported on 11/17/2021), Disp: , Rfl:    gabapentin (NEURONTIN) 100 MG capsule, Take 100 mg by mouth 3 (three) times daily. (Patient not taking: Reported on 10/06/2021), Disp: , Rfl:    HYDROcodone-acetaminophen (NORCO/VICODIN) 5-325 MG tablet, 1-2 tabs po bid prn (Patient not taking: Reported on 11/17/2021), Disp: 10 tablet, Rfl: 0   meclizine (ANTIVERT) 25 MG tablet, Take 1 tablet (25 mg total) by mouth every 6 (six) hours as needed for dizziness. (Patient not taking: Reported on 11/17/2021), Disp: 30 tablet, Rfl: 0  Physical exam:  Vitals:   11/17/21 1359  BP: (!) 143/73  Pulse: 89  Resp: 18  Temp: 97.9 F (36.6 C)  SpO2: 96%  Weight: 172 lb 14.4 oz (78.4 kg)   Physical Exam Constitutional:      General: She is not in acute distress. Cardiovascular:     Rate and Rhythm: Normal rate and regular rhythm.     Heart sounds: Normal heart sounds.  Pulmonary:     Effort: Pulmonary effort is normal.     Breath sounds: Normal breath sounds.  Abdominal:     General: Bowel sounds are normal.     Palpations: Abdomen is soft.  Skin:    General: Skin is warm and dry.  Neurological:     Mental Status: She is alert and oriented to person, place, and time.         Latest Ref Rng & Units 11/17/2021    1:40 PM  CMP  Glucose 70 - 99 mg/dL 115  BUN 8 - 23 mg/dL 13   Creatinine 0.44 - 1.00 mg/dL 0.57   Sodium 135 - 145 mmol/L 137   Potassium 3.5 - 5.1 mmol/L 4.3   Chloride 98 - 111 mmol/L 104   CO2 22 - 32 mmol/L 25   Calcium 8.9 - 10.3 mg/dL 9.1   Total Protein 6.5 - 8.1 g/dL 7.1   Total Bilirubin 0.3 - 1.2 mg/dL 0.4   Alkaline Phos 38 - 126 U/L 99   AST 15 - 41 U/L 34   ALT 0 - 44 U/L 20       Latest Ref Rng & Units 11/17/2021    1:40 PM  CBC  WBC 4.0 - 10.5 K/uL 7.2   Hemoglobin 12.0 - 15.0 g/dL 11.6    Hematocrit 36.0 - 46.0 % 34.7   Platelets 150 - 400 K/uL 374     No images are attached to the encounter.  CT CHEST ABDOMEN PELVIS W CONTRAST  Result Date: 11/12/2021 CLINICAL DATA:  Metastatic lung cancer.  Restaging examination. EXAM: CT CHEST, ABDOMEN, AND PELVIS WITH CONTRAST TECHNIQUE: Multidetector CT imaging of the chest, abdomen and pelvis was performed following the standard protocol during bolus administration of intravenous contrast. RADIATION DOSE REDUCTION: This exam was performed according to the departmental dose-optimization program which includes automated exposure control, adjustment of the mA and/or kV according to patient size and/or use of iterative reconstruction technique. CONTRAST:  152mL OMNIPAQUE IOHEXOL 300 MG/ML  SOLN COMPARISON:  08/11/2021 FINDINGS: CT CHEST FINDINGS Cardiovascular: Mild coronary artery calcification. Global cardiac size within normal limits. Trace pericardial effusion has developed, new since prior examination, without CT evidence of cardiac tamponade. Central pulmonary arteries are of normal caliber. Mild atherosclerotic calcification within the thoracic aorta. No aortic aneurysm. Right internal jugular chest port tip seen within the superior vena cava. Mediastinum/Nodes: Visualized thyroid is unremarkable. No pathologic thoracic adenopathy. Small hiatal hernia. Contrast within the esophagus may relate to changes of gastroesophageal reflux or esophageal dysmotility. Esophagus is otherwise unremarkable. Lungs/Pleura: There is multifocal ground-glass and consolidative infiltrate that has developed within the right mid and lower lung zone, likely infectious in the acute setting. Small right pleural effusion is present. Mild emphysema again noted. Left perihilar and paramediastinal post radiation cicatricial changes are again identified and are unchanged. No recurrent soft tissue mass identified. Interval development of trace left pleural effusion. No  pneumothorax. No central obstructing lesion. Musculoskeletal: No acute bone abnormality. No lytic or blastic bone lesion. Osseous structures are age-appropriate. CT ABDOMEN PELVIS FINDINGS Hepatobiliary: Status post cholecystectomy. Stable mild intra and moderate extrahepatic biliary ductal dilation likely representing post cholecystectomy change. No enhancing intrahepatic mass. Pancreas: Unremarkable Spleen: Unremarkable Adrenals/Urinary Tract: Adrenal glands are unremarkable. Kidneys are normal, without renal calculi, focal lesion, or hydronephrosis. Bladder is obscured by streak artifact from bilateral hip prostheses. Stomach/Bowel: Moderate stool burden throughout the colon without evidence of obstruction. Stomach, small bowel, and large bowel are otherwise unremarkable. No evidence of obstruction or focal inflammation. No free intraperitoneal gas or fluid. Appendix absent. Vascular/Lymphatic: Aortic atherosclerosis. No enlarged abdominal or pelvic lymph nodes. Reproductive: Obscured by streak artifact. Other: Tiny fat containing umbilical hernia. Musculoskeletal: Bilateral total hip arthroplasty has been performed. Degenerative changes are seen within the lumbar spine. No acute bone abnormality. IMPRESSION: 1. Interval development of multifocal ground-glass and consolidative infiltrate within the right mid and lower lung zone, likely infectious in the acute setting. Development of small bilateral pleural effusions, right greater than left. 2. Stable post radiation changes within  the left perihilar and paramediastinal region. No evidence of recurrent or residual disease. 3. Mild emphysema. 4. Mild coronary artery calcification. 5. Moderate stool burden throughout the colon without evidence of obstruction. Aortic Atherosclerosis (ICD10-I70.0) and Emphysema (ICD10-J43.9). Electronically Signed   By: Fidela Salisbury M.D.   On: 11/12/2021 01:00     Assessment and plan- Patient is a 76 y.o. female with recurrent  stage III adenocarcinoma of the lung.  She is here for on treatment assessment prior to cycle 4 of maintenance Alimta  Counts okay to proceed with cycle 4 of maintenance Alimta today.  I have reviewed CT chest abdomen and pelvis images independently and discussed findings with the patient which does not show any evidence of disease progression.  She is tolerating Alimta well and my plan is to continue that until progression or toxicity.  No evidence of anemia or AKI associated with Alimta.  I will see her back in 3 weeks for cycle 5.  CT scan does show evidence of new groundglass opacities and inflammatory findings in the right lower lobe.  Clinically her symptoms are no different than before but given the CT findings I am starting her on Augmentin empirically for 10 days.   Visit Diagnosis 1. Malignant neoplasm of upper lobe of left lung ([place])   2. Encounter for antineoplastic chemotherapy   3. Abnormal CT of the chest      Dr. Randa Evens, MD, MPH [facility] [phone] 11/18/2021 12:12 PM

## 2021-12-07 MED FILL — Dexamethasone Sodium Phosphate Inj 100 MG/10ML: INTRAMUSCULAR | Qty: 1 | Status: AC

## 2021-12-08 ENCOUNTER — Inpatient Hospital Stay (HOSPITAL_BASED_OUTPATIENT_CLINIC_OR_DEPARTMENT_OTHER): Payer: Medicare Other | Admitting: Oncology

## 2021-12-08 ENCOUNTER — Inpatient Hospital Stay: Payer: Medicare Other

## 2021-12-08 ENCOUNTER — Encounter: Payer: Self-pay | Admitting: Oncology

## 2021-12-08 VITALS — BP 128/75 | HR 98 | Temp 97.5°F | Resp 18 | Wt 170.0 lb

## 2021-12-08 DIAGNOSIS — C3412 Malignant neoplasm of upper lobe, left bronchus or lung: Secondary | ICD-10-CM

## 2021-12-08 DIAGNOSIS — Z5111 Encounter for antineoplastic chemotherapy: Secondary | ICD-10-CM

## 2021-12-08 LAB — CBC WITH DIFFERENTIAL/PLATELET
Abs Immature Granulocytes: 0.01 10*3/uL (ref 0.00–0.07)
Basophils Absolute: 0 10*3/uL (ref 0.0–0.1)
Basophils Relative: 1 %
Eosinophils Absolute: 0.1 10*3/uL (ref 0.0–0.5)
Eosinophils Relative: 3 %
HCT: 32.7 % — ABNORMAL LOW (ref 36.0–46.0)
Hemoglobin: 10.9 g/dL — ABNORMAL LOW (ref 12.0–15.0)
Immature Granulocytes: 0 %
Lymphocytes Relative: 16 %
Lymphs Abs: 0.5 10*3/uL — ABNORMAL LOW (ref 0.7–4.0)
MCH: 31.4 pg (ref 26.0–34.0)
MCHC: 33.3 g/dL (ref 30.0–36.0)
MCV: 94.2 fL (ref 80.0–100.0)
Monocytes Absolute: 0.5 10*3/uL (ref 0.1–1.0)
Monocytes Relative: 15 %
Neutro Abs: 2.1 10*3/uL (ref 1.7–7.7)
Neutrophils Relative %: 65 %
Platelets: 251 10*3/uL (ref 150–400)
RBC: 3.47 MIL/uL — ABNORMAL LOW (ref 3.87–5.11)
RDW: 14.4 % (ref 11.5–15.5)
WBC: 3.3 10*3/uL — ABNORMAL LOW (ref 4.0–10.5)
nRBC: 0 % (ref 0.0–0.2)

## 2021-12-08 LAB — COMPREHENSIVE METABOLIC PANEL
ALT: 24 U/L (ref 0–44)
AST: 30 U/L (ref 15–41)
Albumin: 3.7 g/dL (ref 3.5–5.0)
Alkaline Phosphatase: 80 U/L (ref 38–126)
Anion gap: 5 (ref 5–15)
BUN: 12 mg/dL (ref 8–23)
CO2: 27 mmol/L (ref 22–32)
Calcium: 9 mg/dL (ref 8.9–10.3)
Chloride: 106 mmol/L (ref 98–111)
Creatinine, Ser: 0.52 mg/dL (ref 0.44–1.00)
GFR, Estimated: >60 mL/min (ref >60)
Glucose, Bld: 97 mg/dL (ref 70–99)
Potassium: 4.1 mmol/L (ref 3.5–5.1)
Sodium: 138 mmol/L (ref 135–145)
Total Bilirubin: 0.5 mg/dL (ref 0.3–1.2)
Total Protein: 6.5 g/dL (ref 6.5–8.1)

## 2021-12-08 MED ORDER — HEPARIN SOD (PORK) LOCK FLUSH 100 UNIT/ML IV SOLN
500.0000 [IU] | Freq: Once | INTRAVENOUS | Status: AC | PRN
  Administered 2021-12-08: 500 [IU]
  Filled 2021-12-08: qty 5

## 2021-12-08 MED ORDER — SODIUM CHLORIDE 0.9 % IV SOLN
Freq: Once | INTRAVENOUS | Status: AC
  Filled 2021-12-08: qty 250

## 2021-12-08 MED ORDER — SODIUM CHLORIDE 0.9% FLUSH
10.0000 mL | INTRAVENOUS | Status: DC | PRN
  Administered 2021-12-08: 10 mL via INTRAVENOUS
  Filled 2021-12-08: qty 10

## 2021-12-08 MED ORDER — HEPARIN SOD (PORK) LOCK FLUSH 100 UNIT/ML IV SOLN
INTRAVENOUS | Status: AC
  Filled 2021-12-08: qty 5

## 2021-12-08 MED ORDER — SODIUM CHLORIDE 0.9 % IV SOLN
10.0000 mg | Freq: Once | INTRAVENOUS | Status: AC
  Administered 2021-12-08: 10 mg via INTRAVENOUS
  Filled 2021-12-08: qty 10

## 2021-12-08 MED ORDER — SODIUM CHLORIDE 0.9 % IV SOLN
500.0000 mg/m2 | Freq: Once | INTRAVENOUS | Status: AC
  Administered 2021-12-08: 900 mg via INTRAVENOUS
  Filled 2021-12-08: qty 36

## 2021-12-08 MED ORDER — CYANOCOBALAMIN 1000 MCG/ML IJ SOLN
1000.0000 ug | Freq: Once | INTRAMUSCULAR | Status: AC
  Administered 2021-12-08: 1000 ug via INTRAMUSCULAR
  Filled 2021-12-08: qty 1

## 2021-12-08 NOTE — Patient Instructions (Signed)
[facility] CANCER CTR AT [place]  Discharge Instructions: Thank you for choosing [facility] to provide your oncology and hematology care.  If you have a lab appointment with the [facility], please go directly to the [facility] and check in at the registration area.  Wear comfortable clothing and clothing appropriate for easy access to any Portacath or PICC line.   We strive to give you quality time with your provider. You may need to reschedule your appointment if you arrive late (15 or more minutes).  Arriving late affects you and other patients whose appointments are after yours.  Also, if you miss three or more appointments without notifying the office, you may be dismissed from the clinic at the provider's discretion.      For prescription refill requests, have your pharmacy contact our office and allow 72 hours for refills to be completed.    Today you received the following chemotherapy and/or immunotherapy agents Pemetrexed.     To help prevent nausea and vomiting after your treatment, we encourage you to take your nausea medication as directed.  BELOW ARE SYMPTOMS THAT SHOULD BE REPORTED IMMEDIATELY: *FEVER GREATER THAN 100.4 F (38 C) OR HIGHER *CHILLS OR SWEATING *NAUSEA AND VOMITING THAT IS NOT CONTROLLED WITH YOUR NAUSEA MEDICATION *UNUSUAL SHORTNESS OF BREATH *UNUSUAL BRUISING OR BLEEDING *URINARY PROBLEMS (pain or burning when urinating, or frequent urination) *BOWEL PROBLEMS (unusual diarrhea, constipation, pain near the anus) TENDERNESS IN MOUTH AND THROAT WITH OR WITHOUT PRESENCE OF ULCERS (sore throat, sores in mouth, or a toothache) UNUSUAL RASH, SWELLING OR PAIN  UNUSUAL VAGINAL DISCHARGE OR ITCHING   Items with * indicate a potential emergency and should be followed up as soon as possible or go to the Emergency Department if any problems should occur.  Please show the CHEMOTHERAPY ALERT CARD or IMMUNOTHERAPY ALERT CARD at check-in to  the Emergency Department and triage nurse.  Should you have questions after your visit or need to cancel or reschedule your appointment, please contact [facility] CANCER [place] AT [place]  [phone] and follow the prompts.  Office hours are 8:00 a.m. to 4:30 p.m. Monday - Friday. Please note that voicemails left after 4:00 p.m. may not be returned until the following business day.  We are closed weekends and major holidays. You have access to a nurse at all times for urgent questions. Please call the main number to the clinic [phone] and follow the prompts.  For any non-urgent questions, you may also contact your provider using MyChart. We now offer e-Visits for anyone 68 and older to request care online for non-urgent symptoms. For details visit [email or web address].   Also download the MyChart app! Go to the app store, search "MyChart", open the app, select [facility], and log in with your MyChart username and password.  Masks are optional in the cancer centers. If you would like for your care team to wear a mask while they are taking care of you, please let them know. For doctor visits, patients may have with them one support person who is at least 77 years old. At this time, visitors are not allowed in the infusion area.

## 2021-12-08 NOTE — Progress Notes (Signed)
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  EGFR and Ros as well as MET testing negative. PD-L1 was 50%   Patient completed concurrent chemoradiation with weekly carbotaxol chemotherapy on 03/12/2019.  Maintenance durvalumab started in January 2021 after scan showed partial response.  Patient completed 1 year of maintenance durvalumab in February 2022   Patient was found to have enlarging mediastinal adenopathyOn subsequent CT scan in January 2023.  This was followed by a PET scan which showed hypermetabolic mediastinal and right hilar lymph nodes but no other evidence of disease elsewhere.  This was biopsied and was consistent with non-small cell lung cancer specifically adenocarcinoma.  Cells were positive for TTF-1 and Napsin A and negative for p40.   Patient completed concurrent chemoradiation with 4 cycles of carbo Alimta ending in May 2023.  Scan showed partial response.  Plan is for maintenance Alimta until progression or toxicity     Interval history-she has on and off periods of cough.  Denies any worsening shortness of breath.  No  fevers  ECOG PS- 1 Pain scale- 0 Opioid associated constipation- no  Review of systems- Review of Systems  Constitutional:  Positive for malaise/fatigue. Negative for chills, fever and weight loss.  HENT:  Negative for congestion, ear discharge and nosebleeds.   Eyes:  Negative for blurred vision.  Respiratory:  Positive for cough. Negative for hemoptysis, sputum production, shortness of breath and wheezing.   Cardiovascular:  Negative for chest pain, palpitations, orthopnea and claudication.  Gastrointestinal:  Negative for abdominal pain, blood in stool, constipation, diarrhea, heartburn, melena, nausea and vomiting.  Genitourinary:  Negative for dysuria, flank pain, frequency, hematuria and urgency.  Musculoskeletal:  Negative for back pain, joint pain and myalgias.  Skin:  Negative for rash.  Neurological:  Negative for dizziness, tingling, focal weakness, seizures, weakness and headaches.  Endo/Heme/Allergies:  Does not bruise/bleed easily.  Psychiatric/Behavioral:  Negative for depression and suicidal ideas. The patient does not have insomnia.       Allergies  Allergen Reactions   Aspirin Hives, Shortness Of Breath and Other (See Comments)    Difficulty breathing   Celebrex [Celecoxib] Shortness Of Breath   Morphine And Related Nausea And Vomiting and Swelling   Adhesive [Tape] Other (See Comments)    Took top layer of skin off when removed.   Clarithromycin Nausea And Vomiting   Codeine Nausea And Vomiting   Darvon [Propoxyphene Hcl] Nausea And Vomiting   Demerol [Meperidine] Nausea And Vomiting   Flonase [Fluticasone Propionate] Other (See Comments)    Fungal infection in nose   Simvastatin Other (See Comments)    "caused ulcers in mouth, and fever"   Talwin [Pentazocine] Nausea And Vomiting     Past  Medical History:  Diagnosis Date   Anemia    Asthma    No Inhalers--Dr. [name] will order as needed   Bronchiectasis ([place])    mild   Chronic headaches      followed by [facility] migraines   COPD (chronic obstructive pulmonary disease) (HCC)    DDD (degenerative disc disease), lumbar    Diverticulosis    Family history of adverse reaction to anesthesia    mother and sisters-PONV   Gall stones    history of   GERD (gastroesophageal reflux disease)    EGD 8/09- non bleeding erosive gastritis, documentd esophageal ulcerations.    Hiatal hernia    small   History of kidney stones    History of pneumonia    Hypercholesterolemia    IBS (irritable bowel syndrome)    Malignant neoplasm of upper lobe of left lung ([place]) 12/27/2018   Meniere disease    Murmur    Osteoarthritis    lumbar disc disease, left hip   Personal history of chemotherapy    Personal history of radiation therapy    Pneumonia 12/2020   PONV (postoperative nausea and vomiting)    Sleep apnea    uses cpap   Vertigo    Weakness of right side of body      Past Surgical History:  Procedure Laterality Date   ABDOMINAL HYSTERECTOMY  age 57   ANTERIOR CERVICAL DECOMP/DISCECTOMY FUSION N/A 02/24/2015   Procedure: CERVICAL FOUR-FIVE, CERVICAL FIVE-SIX, CERVICAL SIX-SEVEN ANTERIOR CERVICAL DECOMPRESSION/DISCECTOMY FUSION ;  Surgeon: [name], MD;  Location: [facility];  Service: Neurosurgery;  Laterality: N/A;  C45 C56 C67 anterior cervical decompression with fusion interbody prosthesis plating and bonegraft   APPENDECTOMY     BACK SURGERY  1988   4th lumbar fusion   BLADDER SURGERY N/A    with vaginal wall repair   BREAST CYST ASPIRATION Bilateral    neg   BREAST SURGERY Bilateral    cyst removed and reduction   CARDIAC CATHETERIZATION  2014   CARPAL TUNNEL RELEASE Right 02/11/2016   Procedure: CARPAL TUNNEL RELEASE;  Surgeon: [name], MD;  Location: [facility];  Service: Orthopedics;  Laterality: Right;   CATARACT EXTRACTION W/ INTRAOCULAR LENS IMPLANT Bilateral 2015   CHOLECYSTECTOMY     EUS N/A 05/31/2012   Procedure: UPPER ENDOSCOPIC ULTRASOUND  (EUS) LINEAR;  Surgeon: [name], MD;  Location: [facility];  Service: Endoscopy;  Laterality: N/A;   EXCISIONAL HEMORRHOIDECTOMY     JOINT REPLACEMENT Bilateral    KNEE ARTHROSCOPY WITH LATERAL MENISECTOMY Right 07/07/2015   Procedure: KNEE ARTHROSCOPY WITH LATERAL MENISECTOMY, PARTIAL SYNOVECTOMY;  Surgeon: [name], MD;  Location: [facility];  Service: Orthopedics;  Laterality: Right;   LUMBAR LAMINECTOMY     PORTA CATH INSERTION N/A 01/07/2019   Procedure: PORTA CATH INSERTION;  Surgeon: [name], MD;  Location: [facility] [facility];  Service: Cardiovascular;  Laterality: N/A;   REDUCTION MAMMAPLASTY  1990   RIGHT OOPHORECTOMY     TOTAL HIP ARTHROPLASTY Left 05/01/2014   Dr. [name]   TOTAL HIP ARTHROPLASTY Right 08/05/2014   Procedure: TOTAL HIP ARTHROPLASTY ANTERIOR APPROACH;  Surgeon: [name], MD;  Location: [facility];  Service: Orthopedics;  Laterality: Right;   ULNAR NERVE TRANSPOSITION Right 02/11/2016   Procedure: ULNAR NERVE DECOMPRESSION/TRANSPOSITION;  Surgeon: [name], MD;  Location: [facility];  Service: Orthopedics;  Laterality: Right;   VIDEO BRONCHOSCOPY WITH ENDOBRONCHIAL ULTRASOUND Left 12/19/2018   Procedure: VIDEO BRONCHOSCOPY WITH  ENDOBRONCHIAL ULTRASOUND, LEFT, SLEEP APNEA;  Surgeon: [name], MD;  Location: [facility];  Service: Thoracic;  Laterality: Left;   VIDEO BRONCHOSCOPY WITH ENDOBRONCHIAL ULTRASOUND Right 05/14/2021   Procedure: VIDEO BRONCHOSCOPY WITH ENDOBRONCHIAL ULTRASOUND;  Surgeon: Tyler Pita, MD;  Location: [facility];  Service: Pulmonary;  Laterality: Right;    Social History   Socioeconomic History   Marital status: Married    Spouse name: Not on file   Number of children: Not on file   Years of education: Not on file   Highest education level: Not on file  Occupational History   Not on file  Tobacco Use   Smoking status: Former    Packs/day: 1.00    Years: 40.00    Total pack years: 40.00    Types:  Cigarettes    Quit date: 06/13/2007    Years since quitting: 14.4   Smokeless tobacco: Never  Vaping Use   Vaping Use: Never used  Substance and Sexual Activity   Alcohol use: No    Alcohol/week: 0.0 standard drinks of alcohol   Drug use: No   Sexual activity: Not on file  Other Topics Concern   Not on file  Social History Narrative   Not on file   Social Determinants of Health   Financial Resource Strain: Not on file  Food Insecurity: Not on file  Transportation Needs: Not on file  Physical Activity: Not on file  Stress: Not on file  Social Connections: Not on file  Intimate Partner Violence: Not on file    Family History  Problem Relation Age of Onset   Heart disease Mother        s/p stent   Hypertension Mother    Hypercholesterolemia Mother    Diabetes Father    Stomach cancer Other        uncle   Breast cancer Cousin    Breast cancer Paternal Aunt      Current Outpatient Medications:    acetaminophen (TYLENOL) 500 MG tablet, Take 500 mg by mouth 2 (two) times daily., Disp: , Rfl:    albuterol (VENTOLIN HFA) 108 (90 Base) MCG/ACT inhaler, Inhale 2 puffs into the lungs every 6 (six) hours as needed for wheezing or shortness of breath., Disp: , Rfl:    Bioflavonoid Products (ESTER C PO), Take 1 tablet by mouth 3 (three) times daily. 500 mg, Disp: , Rfl:    Calcium-Magnesium-Zinc (CAL-MAG-ZINC PO), Take 1 tablet by mouth daily., Disp: , Rfl:    carboxymethylcellulose (REFRESH PLUS) 0.5 % SOLN, Place 1 drop into both eyes 3 (three) times daily as needed (dry eyes)., Disp: , Rfl:    cetirizine (ZYRTEC) 5 MG tablet, Take 1 tablet (5 mg total) by mouth daily., Disp: 30 tablet, Rfl: 0   Cholecalciferol (EQL VITAMIN D3) 25 MCG (1000 UT) capsule, Take 1,000 Units by mouth daily., Disp: , Rfl:    Coenzyme Q10 (CO Q 10) 100 MG CAPS, Take 100 mg by mouth daily. , Disp: , Rfl:    Cyanocobalamin (VITAMIN B-12) 2500 MCG SUBL, Place 2,500 mcg under the tongue daily., Disp: , Rfl:     cyclobenzaprine (FLEXERIL) 10 MG tablet, Take 10 mg by mouth at bedtime., Disp: , Rfl:    DHEA 25 MG CAPS, Take 25 mg by mouth daily., Disp: , Rfl:    ezetimibe (ZETIA) 10 MG tablet, Take 5 mg by mouth in the morning and at bedtime., Disp: , Rfl:    folic acid (FOLVITE) 250 MCG tablet,  Take 400 mcg by mouth daily., Disp: , Rfl:    Garlic 5093 MG CAPS, Take 1,000 mg by mouth daily., Disp: , Rfl:    Histamine Dihydrochloride (AUSTRALIAN DREAM ARTHRITIS) 0.025 % CREA, Apply 1 application. topically 4 (four) times daily as needed (pain)., Disp: , Rfl:    levOCARNitine (L-CARNITINE) 250 MG TABS, Take 1 tablet by mouth daily. With Chromium 250 mg, Disp: , Rfl:    pantoprazole (PROTONIX) 40 MG tablet, Take 40 mg by mouth 2 (two) times daily., Disp: , Rfl:    polyethylene glycol powder (GLYCOLAX/MIRALAX) 17 GM/SCOOP powder, Take 17 g by mouth daily as needed for moderate constipation., Disp: , Rfl:    pyridOXINE (VITAMIN B-6) 100 MG tablet, Take 100 mg by mouth daily., Disp: , Rfl:    Selenium 200 MCG CAPS, Take 200 mcg by mouth daily., Disp: , Rfl:    Sennosides (SENNA) 8.6 MG CAPS, Take 1 capsule by mouth daily as needed (constipation)., Disp: , Rfl:    sucralfate (CARAFATE) 1 GM/10ML suspension, Take 1 g by mouth daily., Disp: , Rfl:    Tiotropium Bromide-Olodaterol (STIOLTO RESPIMAT) 2.5-2.5 MCG/ACT AERS, Inhale 2 puffs into the lungs daily., Disp: 4 g, Rfl: 10   tobramycin-dexamethasone (TOBRADEX) ophthalmic solution, SMARTSIG:1 Drop(s) In Eye(s) 2-3 Times Daily PRN, Disp: , Rfl:    triamcinolone (NASACORT) 55 MCG/ACT AERO nasal inhaler, Place 2 sprays into the nose daily., Disp: , Rfl:    UBRELVY 100 MG TABS, Take 100 mg by mouth daily as needed (migraines)., Disp: , Rfl:    Vitamin A 2400 MCG (8000 UT) CAPS, Take 8,000 Units by mouth daily., Disp: , Rfl:    Vitamin E 200 units TABS, Take 200 Units by mouth daily., Disp: , Rfl:    chlorhexidine (PERIDEX) 0.12 % solution, SMARTSIG:By Mouth  (Patient not taking: Reported on 11/17/2021), Disp: , Rfl:    gabapentin (NEURONTIN) 100 MG capsule, Take 100 mg by mouth 3 (three) times daily. (Patient not taking: Reported on 10/06/2021), Disp: , Rfl:    HYDROcodone-acetaminophen (NORCO/VICODIN) 5-325 MG tablet, 1-2 tabs po bid prn (Patient not taking: Reported on 11/17/2021), Disp: 10 tablet, Rfl: 0   meclizine (ANTIVERT) 25 MG tablet, Take 1 tablet (25 mg total) by mouth every 6 (six) hours as needed for dizziness. (Patient not taking: Reported on 11/17/2021), Disp: 30 tablet, Rfl: 0 No current facility-administered medications for this visit.  Facility-Administered Medications Ordered in Other Visits:    heparin lock flush 100 UNIT/ML injection, , , ,    sodium chloride flush (NS) 0.9 % injection 10 mL, 10 mL, Intravenous, PRN, Sindy Guadeloupe, MD, 10 mL at 12/08/21 0856  Physical exam:  Vitals:   12/08/21 0926  BP: 128/75  Pulse: 98  Resp: 18  Temp: (!) 97.5 F (36.4 C)  SpO2: 99%  Weight: 170 lb (77.1 kg)   Physical Exam Constitutional:      General: She is not in acute distress. Cardiovascular:     Rate and Rhythm: Normal rate and regular rhythm.     Heart sounds: Normal heart sounds.  Pulmonary:     Effort: Pulmonary effort is normal.     Breath sounds: Normal breath sounds.  Abdominal:     General: Bowel sounds are normal.     Palpations: Abdomen is soft.  Skin:    General: Skin is warm and dry.  Neurological:     Mental Status: She is alert and oriented to person, place, and time.  Latest Ref Rng & Units 12/08/2021    8:54 AM  CMP  Glucose 70 - 99 mg/dL 97   BUN 8 - 23 mg/dL 12   Creatinine 0.44 - 1.00 mg/dL 0.52   Sodium 135 - 145 mmol/L 138   Potassium 3.5 - 5.1 mmol/L 4.1   Chloride 98 - 111 mmol/L 106   CO2 22 - 32 mmol/L 27   Calcium 8.9 - 10.3 mg/dL 9.0   Total Protein 6.5 - 8.1 g/dL 6.5   Total Bilirubin 0.3 - 1.2 mg/dL 0.5   Alkaline Phos 38 - 126 U/L 80   AST 15 - 41 U/L 30   ALT 0 - 44 U/L  24       Latest Ref Rng & Units 12/08/2021    8:54 AM  CBC  WBC 4.0 - 10.5 K/uL 3.3   Hemoglobin 12.0 - 15.0 g/dL 10.9   Hematocrit 36.0 - 46.0 % 32.7   Platelets 150 - 400 K/uL 251     No images are attached to the encounter.  CT CHEST ABDOMEN PELVIS W CONTRAST  Result Date: 11/12/2021 CLINICAL DATA:  Metastatic lung cancer.  Restaging examination. EXAM: CT CHEST, ABDOMEN, AND PELVIS WITH CONTRAST TECHNIQUE: Multidetector CT imaging of the chest, abdomen and pelvis was performed following the standard protocol during bolus administration of intravenous contrast. RADIATION DOSE REDUCTION: This exam was performed according to the departmental dose-optimization program which includes automated exposure control, adjustment of the mA and/or kV according to patient size and/or use of iterative reconstruction technique. CONTRAST:  12m OMNIPAQUE IOHEXOL 300 MG/ML  SOLN COMPARISON:  08/11/2021 FINDINGS: CT CHEST FINDINGS Cardiovascular: Mild coronary artery calcification. Global cardiac size within normal limits. Trace pericardial effusion has developed, new since prior examination, without CT evidence of cardiac tamponade. Central pulmonary arteries are of normal caliber. Mild atherosclerotic calcification within the thoracic aorta. No aortic aneurysm. Right internal jugular chest port tip seen within the superior vena cava. Mediastinum/Nodes: Visualized thyroid is unremarkable. No pathologic thoracic adenopathy. Small hiatal hernia. Contrast within the esophagus may relate to changes of gastroesophageal reflux or esophageal dysmotility. Esophagus is otherwise unremarkable. Lungs/Pleura: There is multifocal ground-glass and consolidative infiltrate that has developed within the right mid and lower lung zone, likely infectious in the acute setting. Small right pleural effusion is present. Mild emphysema again noted. Left perihilar and paramediastinal post radiation cicatricial changes are again identified  and are unchanged. No recurrent soft tissue mass identified. Interval development of trace left pleural effusion. No pneumothorax. No central obstructing lesion. Musculoskeletal: No acute bone abnormality. No lytic or blastic bone lesion. Osseous structures are age-appropriate. CT ABDOMEN PELVIS FINDINGS Hepatobiliary: Status post cholecystectomy. Stable mild intra and moderate extrahepatic biliary ductal dilation likely representing post cholecystectomy change. No enhancing intrahepatic mass. Pancreas: Unremarkable Spleen: Unremarkable Adrenals/Urinary Tract: Adrenal glands are unremarkable. Kidneys are normal, without renal calculi, focal lesion, or hydronephrosis. Bladder is obscured by streak artifact from bilateral hip prostheses. Stomach/Bowel: Moderate stool burden throughout the colon without evidence of obstruction. Stomach, small bowel, and large bowel are otherwise unremarkable. No evidence of obstruction or focal inflammation. No free intraperitoneal gas or fluid. Appendix absent. Vascular/Lymphatic: Aortic atherosclerosis. No enlarged abdominal or pelvic lymph nodes. Reproductive: Obscured by streak artifact. Other: Tiny fat containing umbilical hernia. Musculoskeletal: Bilateral total hip arthroplasty has been performed. Degenerative changes are seen within the lumbar spine. No acute bone abnormality. IMPRESSION: 1. Interval development of multifocal ground-glass and consolidative infiltrate within the right mid and lower lung zone,  likely infectious in the acute setting. Development of small bilateral pleural effusions, right greater than left. 2. Stable post radiation changes within the left perihilar and paramediastinal region. No evidence of recurrent or residual disease. 3. Mild emphysema. 4. Mild coronary artery calcification. 5. Moderate stool burden throughout the colon without evidence of obstruction. Aortic Atherosclerosis (ICD10-I70.0) and Emphysema (ICD10-J43.9). Electronically Signed   By:  Fidela Salisbury M.D.   On: 11/12/2021 01:00     Assessment and plan- Patient is a 76 y.o. 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  She is here for on treatment assessment prior to cycle 5 of maintenance Alimta.  Counts okay to proceed with cycle 5 of maintenance Alimta today.  I will see her back in 3 weeks for cycle 6.  Repeat scans to be done in November 2023.  So far tolerating treatment well without any significant anemia or AKI.  She will continue folic acid daily.  She also receives B12 injections with each treatment   Visit Diagnosis 1. Encounter for antineoplastic chemotherapy   2. Malignant neoplasm of upper lobe of left lung ([place])      Dr. Randa Evens, MD, MPH [facility] [phone] 12/08/2021 12:55 PM

## 2021-12-08 NOTE — Progress Notes (Signed)
Pt will like to discuss her cough becuase it does not seem to be improving.

## 2021-12-28 MED FILL — Dexamethasone Sodium Phosphate Inj 100 MG/10ML: INTRAMUSCULAR | Qty: 1 | Status: AC

## 2021-12-29 ENCOUNTER — Inpatient Hospital Stay: Payer: Medicare Other

## 2021-12-29 ENCOUNTER — Inpatient Hospital Stay (HOSPITAL_BASED_OUTPATIENT_CLINIC_OR_DEPARTMENT_OTHER): Payer: Medicare Other | Admitting: Oncology

## 2021-12-29 ENCOUNTER — Inpatient Hospital Stay: Payer: Medicare Other | Attending: Oncology

## 2021-12-29 ENCOUNTER — Encounter: Payer: Self-pay | Admitting: Oncology

## 2021-12-29 VITALS — BP 134/71 | HR 93 | Resp 18 | Wt 174.0 lb

## 2021-12-29 DIAGNOSIS — C3412 Malignant neoplasm of upper lobe, left bronchus or lung: Secondary | ICD-10-CM

## 2021-12-29 DIAGNOSIS — Z5111 Encounter for antineoplastic chemotherapy: Secondary | ICD-10-CM | POA: Diagnosis present

## 2021-12-29 DIAGNOSIS — C3411 Malignant neoplasm of upper lobe, right bronchus or lung: Secondary | ICD-10-CM | POA: Diagnosis present

## 2021-12-29 LAB — COMPREHENSIVE METABOLIC PANEL
ALT: 22 U/L (ref 0–44)
AST: 32 U/L (ref 15–41)
Albumin: 3.6 g/dL (ref 3.5–5.0)
Alkaline Phosphatase: 91 U/L (ref 38–126)
Anion gap: 6 (ref 5–15)
BUN: 10 mg/dL (ref 8–23)
CO2: 27 mmol/L (ref 22–32)
Calcium: 9.1 mg/dL (ref 8.9–10.3)
Chloride: 106 mmol/L (ref 98–111)
Creatinine, Ser: 0.66 mg/dL (ref 0.44–1.00)
GFR, Estimated: >60 mL/min (ref >60)
Glucose, Bld: 104 mg/dL — ABNORMAL HIGH (ref 70–99)
Potassium: 3.7 mmol/L (ref 3.5–5.1)
Sodium: 139 mmol/L (ref 135–145)
Total Bilirubin: 0.7 mg/dL (ref 0.3–1.2)
Total Protein: 6.5 g/dL (ref 6.5–8.1)

## 2021-12-29 LAB — CBC WITH DIFFERENTIAL/PLATELET
Abs Immature Granulocytes: 0.01 10*3/uL (ref 0.00–0.07)
Basophils Absolute: 0 10*3/uL (ref 0.0–0.1)
Basophils Relative: 1 %
Eosinophils Absolute: 0.1 10*3/uL (ref 0.0–0.5)
Eosinophils Relative: 2 %
HCT: 35 % — ABNORMAL LOW (ref 36.0–46.0)
Hemoglobin: 11.2 g/dL — ABNORMAL LOW (ref 12.0–15.0)
Immature Granulocytes: 0 %
Lymphocytes Relative: 12 %
Lymphs Abs: 0.6 10*3/uL — ABNORMAL LOW (ref 0.7–4.0)
MCH: 31.5 pg (ref 26.0–34.0)
MCHC: 32 g/dL (ref 30.0–36.0)
MCV: 98.3 fL (ref 80.0–100.0)
Monocytes Absolute: 0.6 10*3/uL (ref 0.1–1.0)
Monocytes Relative: 12 %
Neutro Abs: 3.5 10*3/uL (ref 1.7–7.7)
Neutrophils Relative %: 73 %
Platelets: 313 10*3/uL (ref 150–400)
RBC: 3.56 MIL/uL — ABNORMAL LOW (ref 3.87–5.11)
RDW: 15.5 % (ref 11.5–15.5)
WBC: 4.8 10*3/uL (ref 4.0–10.5)
nRBC: 0 % (ref 0.0–0.2)

## 2021-12-29 MED ORDER — HEPARIN SOD (PORK) LOCK FLUSH 100 UNIT/ML IV SOLN
500.0000 [IU] | Freq: Once | INTRAVENOUS | Status: AC | PRN
  Filled 2021-12-29: qty 5

## 2021-12-29 MED ORDER — SODIUM CHLORIDE 0.9 % IV SOLN
Freq: Once | INTRAVENOUS | Status: AC
  Filled 2021-12-29: qty 250

## 2021-12-29 MED ORDER — SODIUM CHLORIDE 0.9 % IV SOLN
10.0000 mg | Freq: Once | INTRAVENOUS | Status: AC
  Administered 2021-12-29: 10 mg via INTRAVENOUS
  Filled 2021-12-29: qty 10

## 2021-12-29 MED ORDER — CYANOCOBALAMIN 1000 MCG/ML IJ SOLN
1000.0000 ug | Freq: Once | INTRAMUSCULAR | Status: AC
  Administered 2021-12-29: 1000 ug via INTRAMUSCULAR
  Filled 2021-12-29: qty 1

## 2021-12-29 MED ORDER — SODIUM CHLORIDE 0.9% FLUSH
10.0000 mL | Freq: Once | INTRAVENOUS | Status: AC
  Administered 2021-12-29: 10 mL via INTRAVENOUS
  Filled 2021-12-29: qty 10

## 2021-12-29 MED ORDER — SODIUM CHLORIDE 0.9 % IV SOLN
500.0000 mg/m2 | Freq: Once | INTRAVENOUS | Status: AC
  Administered 2021-12-29: 900 mg via INTRAVENOUS
  Filled 2021-12-29: qty 20

## 2021-12-29 MED ORDER — ALTEPLASE 2 MG IJ SOLR
2.0000 mg | Freq: Once | INTRAMUSCULAR | Status: AC | PRN
  Administered 2021-12-29: 2 mg
  Filled 2021-12-29: qty 2

## 2021-12-29 MED ORDER — HEPARIN SOD (PORK) LOCK FLUSH 100 UNIT/ML IV SOLN
INTRAVENOUS | Status: AC
  Administered 2021-12-29: 500 [IU]
  Filled 2021-12-29: qty 5

## 2021-12-29 NOTE — Patient Instructions (Signed)
[facility]  Discharge Instructions: Thank you for choosing [facility] to provide your oncology and hematology care.  If you have a lab appointment with the Cancer Center, please go directly to the Cancer Center and check in at the registration area.  Wear comfortable clothing and clothing appropriate for easy access to any Portacath or PICC line.   We strive to give you quality time with your provider. You may need to reschedule your appointment if you arrive late (15 or more minutes).  Arriving late affects you and other patients whose appointments are after yours.  Also, if you miss three or more appointments without notifying the office, you may be dismissed from the clinic at the provider's discretion.      For prescription refill requests, have your pharmacy contact our office and allow 72 hours for refills to be completed.    Today you received the following chemotherapy and/or immunotherapy agents: Alimta      To help prevent nausea and vomiting after your treatment, we encourage you to take your nausea medication as directed.  BELOW ARE SYMPTOMS THAT SHOULD BE REPORTED IMMEDIATELY: *FEVER GREATER THAN 100.4 F (38 C) OR HIGHER *CHILLS OR SWEATING *NAUSEA AND VOMITING THAT IS NOT CONTROLLED WITH YOUR NAUSEA MEDICATION *UNUSUAL SHORTNESS OF BREATH *UNUSUAL BRUISING OR BLEEDING *URINARY PROBLEMS (pain or burning when urinating, or frequent urination) *BOWEL PROBLEMS (unusual diarrhea, constipation, pain near the anus) TENDERNESS IN MOUTH AND THROAT WITH OR WITHOUT PRESENCE OF ULCERS (sore throat, sores in mouth, or a toothache) UNUSUAL RASH, SWELLING OR PAIN  UNUSUAL VAGINAL DISCHARGE OR ITCHING   Items with * indicate a potential emergency and should be followed up as soon as possible or go to the Emergency Department if any problems should occur.  Please show the CHEMOTHERAPY ALERT CARD or IMMUNOTHERAPY ALERT CARD at check-in to the  Emergency Department and triage nurse.  Should you have questions after your visit or need to cancel or reschedule your appointment, please contact [facility]  [phone] and follow the prompts.  Office hours are 8:00 a.m. to 4:30 p.m. Monday - Friday. Please note that voicemails left after 4:00 p.m. may not be returned until the following business day.  We are closed weekends and major holidays. You have access to a nurse at all times for urgent questions. Please call the main number to the clinic [phone] and follow the prompts.  For any non-urgent questions, you may also contact your provider using MyChart. We now offer e-Visits for anyone 18 and older to request care online for non-urgent symptoms. For details visit [email or web address].   Also download the MyChart app! Go to the app store, search "MyChart", open the app, select [facility], and log in with your MyChart username and password.  Masks are optional in the cancer centers. If you would like for your care team to wear a mask while they are taking care of you, please let them know. For doctor visits, patients may have with them one support person who is at least 77 years old. At this time, visitors are not allowed in the infusion area.   

## 2021-12-29 NOTE — Progress Notes (Signed)
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  EGFR and Ros as well as MET testing negative. PD-L1 was 50%   Patient completed concurrent chemoradiation with weekly carbotaxol chemotherapy on 03/12/2019.  Maintenance durvalumab started in January 2021 after scan showed partial response.  Patient completed 1 year of maintenance durvalumab in February 2022   Patient was found to have enlarging mediastinal adenopathyOn subsequent CT scan in January 2023.  This was followed by a PET scan which showed hypermetabolic mediastinal and right hilar lymph nodes but no other evidence of disease elsewhere.  This was biopsied and was consistent with non-small cell lung cancer specifically adenocarcinoma.  Cells were positive for TTF-1 and Napsin A and negative for p40.   Patient completed concurrent chemoradiation with 4 cycles of carbo Alimta ending in May 2023.  Scan showed partial response.  Plan is for maintenance Alimta until progression or toxicity     Interval history- She gets bilateral foot swelling especially in the mornings when she wakes up which  tends to get better with movement.  Continues to have on and off cough and wheezing.  Denies any fever.  Also has ongoing symptoms of vertigo for which she saw ENT and is on meclizine.  Reports right shoulder pain and bilateral hip pain  ECOG PS- 1 Pain scale- 3 Opioid associated constipation- no  Review of systems- Review of Systems  Constitutional:  Positive for malaise/fatigue. Negative for chills, fever and weight loss.  HENT:  Negative for congestion, ear discharge and nosebleeds.   Eyes:  Negative for blurred vision.  Respiratory:  Positive for cough. Negative for hemoptysis, sputum production, shortness of breath and wheezing.   Cardiovascular:  Negative for chest pain, palpitations, orthopnea and claudication.  Gastrointestinal:  Negative for abdominal pain, blood in stool, constipation, diarrhea, heartburn, melena, nausea and vomiting.  Genitourinary:  Negative for dysuria, flank pain, frequency, hematuria and urgency.  Musculoskeletal:  Positive for joint pain. Negative for back pain and myalgias.  Skin:  Negative for rash.  Neurological:  Negative for dizziness, tingling, focal weakness, seizures, weakness and headaches.  Endo/Heme/Allergies:  Does not bruise/bleed easily.  Psychiatric/Behavioral:  Negative for depression and suicidal ideas. The patient does not have insomnia.       Allergies  Allergen Reactions   Aspirin Hives, Shortness Of Breath and Other (See Comments)    Difficulty breathing   Celebrex [Celecoxib] Shortness Of Breath   Morphine And Related Nausea And Vomiting and Swelling   Adhesive [Tape] Other (See Comments)    Took top layer of skin off when removed.   Clarithromycin Nausea And Vomiting   Codeine Nausea And Vomiting   Darvon [Propoxyphene Hcl] Nausea And Vomiting  Demerol [Meperidine] Nausea And Vomiting   Flonase [Fluticasone Propionate] Other (See Comments)    Fungal infection in nose   Simvastatin Other (See Comments)    "caused ulcers in  mouth, and fever"   Talwin [Pentazocine] Nausea And Vomiting     Past Medical History:  Diagnosis Date   Anemia    Asthma    No Inhalers--Dr. [name] will order as needed   Bronchiectasis ([place])    mild   Chronic headaches     followed by [facility] migraines   COPD (chronic obstructive pulmonary disease) ([place])    DDD (degenerative disc disease), lumbar    Diverticulosis    Family history of adverse reaction to anesthesia    mother and sisters-PONV   Gall stones    history of   GERD (gastroesophageal reflux disease)    EGD 8/09- non bleeding erosive gastritis, documentd esophageal ulcerations.    Hiatal hernia    small   History of kidney stones    History of pneumonia    Hypercholesterolemia    IBS (irritable bowel syndrome)    Malignant neoplasm of upper lobe of left lung ([place]) 12/27/2018   Meniere disease    Murmur    Osteoarthritis    lumbar disc disease, left hip   Personal history of chemotherapy    Personal history of radiation therapy    Pneumonia 12/2020   PONV (postoperative nausea and vomiting)    Sleep apnea    uses cpap   Vertigo    Weakness of right side of body      Past Surgical History:  Procedure Laterality Date   ABDOMINAL HYSTERECTOMY  age 89   ANTERIOR CERVICAL DECOMP/DISCECTOMY FUSION N/A 02/24/2015   Procedure: CERVICAL FOUR-FIVE, CERVICAL FIVE-SIX, CERVICAL SIX-SEVEN ANTERIOR CERVICAL DECOMPRESSION/DISCECTOMY FUSION ;  Surgeon: [name], MD;  Location: [place] [facility];  Service: Neurosurgery;  Laterality: N/A;  C45 C56 C67 anterior cervical decompression with fusion interbody prosthesis plating and bonegraft   APPENDECTOMY     BACK SURGERY  1988   4th lumbar fusion   BLADDER SURGERY N/A    with vaginal wall repair   BREAST CYST ASPIRATION Bilateral    neg   BREAST SURGERY Bilateral    cyst removed and reduction   CARDIAC CATHETERIZATION  2014   CARPAL TUNNEL RELEASE Right 02/11/2016   Procedure: CARPAL TUNNEL RELEASE;   Surgeon: [name], MD;  Location: [facility] ORS;  Service: Orthopedics;  Laterality: Right;   CATARACT EXTRACTION W/ INTRAOCULAR LENS IMPLANT Bilateral 2015   CHOLECYSTECTOMY     EUS N/A 05/31/2012   Procedure: UPPER ENDOSCOPIC ULTRASOUND (EUS) LINEAR;  Surgeon: [name], MD;  Location: [facility];  Service: Endoscopy;  Laterality: N/A;   EXCISIONAL HEMORRHOIDECTOMY     JOINT REPLACEMENT Bilateral    KNEE ARTHROSCOPY WITH LATERAL MENISECTOMY Right 07/07/2015   Procedure: KNEE ARTHROSCOPY WITH LATERAL MENISECTOMY, PARTIAL SYNOVECTOMY;  Surgeon: [name], MD;  Location: [facility]S;  Service: Orthopedics;  Laterality: Right;   LUMBAR LAMINECTOMY     PORTA CATH INSERTION N/A 01/07/2019   Procedure: PORTA CATH INSERTION;  Surgeon: [name], MD;  Location: [place] [facility];  Service: Cardiovascular;  Laterality: N/A;   REDUCTION MAMMAPLASTY  1990   RIGHT OOPHORECTOMY     TOTAL HIP ARTHROPLASTY Left 05/01/2014   Dr. [name]   TOTAL HIP ARTHROPLASTY Right 08/05/2014   Procedure: TOTAL HIP ARTHROPLASTY ANTERIOR APPROACH;  Surgeon: [name], MD;  Location: [facility] ORS;  Service:  Orthopedics;  Laterality: Right;   ULNAR NERVE TRANSPOSITION Right 02/11/2016   Procedure: ULNAR NERVE DECOMPRESSION/TRANSPOSITION;  Surgeon: [name], MD;  Location: [facility];  Service: Orthopedics;  Laterality: Right;   VIDEO BRONCHOSCOPY WITH ENDOBRONCHIAL ULTRASOUND Left 12/19/2018   Procedure: VIDEO BRONCHOSCOPY WITH ENDOBRONCHIAL ULTRASOUND, LEFT, SLEEP APNEA;  Surgeon: [name], MD;  Location: [facility] ORS;  Service: Thoracic;  Laterality: Left;   VIDEO BRONCHOSCOPY WITH ENDOBRONCHIAL ULTRASOUND Right 05/14/2021   Procedure: VIDEO BRONCHOSCOPY WITH ENDOBRONCHIAL ULTRASOUND;  Surgeon: Tyler Pita, MD;  Location: [facility] OR[facility];  Service: Pulmonary;  Laterality: Right;    Social History   Socioeconomic History   Marital status: Married    Spouse name: Not on file   Number of children:  Not on file   Years of education: Not on file   Highest education level: Not on file  Occupational History   Not on file  Tobacco Use   Smoking status: Former    Packs/day: 1.00    Years: 40.00    Total pack years: 40.00    Types: Cigarettes    Quit date: 06/13/2007    Years since quitting: 14.5   Smokeless tobacco: Never  Vaping Use   Vaping Use: Never used  Substance and Sexual Activity   Alcohol use: No    Alcohol/week: 0.0 standard drinks of alcohol   Drug use: No   Sexual activity: Not on file  Other Topics Concern   Not on file  Social History Narrative   Not on file   Social Determinants of Health   Financial Resource Strain: Not on file  Food Insecurity: Not on file  Transportation Needs: Not on file  Physical Activity: Not on file  Stress: Not on file  Social Connections: Not on file  Intimate Partner Violence: Not on file    Family History  Problem Relation Age of Onset   Heart disease Mother        s/p stent   Hypertension Mother    Hypercholesterolemia Mother    Diabetes Father    Stomach cancer Other        uncle   Breast cancer Cousin    Breast cancer Paternal Aunt      Current Outpatient Medications:    acetaminophen (TYLENOL) 500 MG tablet, Take 500 mg by mouth 2 (two) times daily., Disp: , Rfl:    albuterol (VENTOLIN HFA) 108 (90 Base) MCG/ACT inhaler, Inhale 2 puffs into the lungs every 6 (six) hours as needed for wheezing or shortness of breath., Disp: , Rfl:    Bioflavonoid Products (ESTER C PO), Take 1 tablet by mouth 3 (three) times daily. 500 mg, Disp: , Rfl:    Calcium-Magnesium-Zinc (CAL-MAG-ZINC PO), Take 1 tablet by mouth daily., Disp: , Rfl:    carboxymethylcellulose (REFRESH PLUS) 0.5 % SOLN, Place 1 drop into both eyes 3 (three) times daily as needed (dry eyes)., Disp: , Rfl:    Cholecalciferol (EQL VITAMIN D3) 25 MCG (1000 UT) capsule, Take 1,000 Units by mouth daily., Disp: , Rfl:    Coenzyme Q10 (CO Q 10) 100 MG CAPS, Take 100  mg by mouth daily. , Disp: , Rfl:    Cyanocobalamin (VITAMIN B-12) 2500 MCG SUBL, Place 2,500 mcg under the tongue daily., Disp: , Rfl:    cyclobenzaprine (FLEXERIL) 10 MG tablet, Take 10 mg by mouth at bedtime., Disp: , Rfl:    DHEA 25 MG CAPS, Take 25 mg by mouth daily., Disp: , Rfl:    ezetimibe (ZETIA)  10 MG tablet, Take 5 mg by mouth in the morning and at bedtime., Disp: , Rfl:    folic acid (FOLVITE) 440 MCG tablet, Take 400 mcg by mouth daily., Disp: , Rfl:    Histamine Dihydrochloride (AUSTRALIAN DREAM ARTHRITIS) 0.025 % CREA, Apply 1 application. topically 4 (four) times daily as needed (pain)., Disp: , Rfl:    levOCARNitine (L-CARNITINE) 250 MG TABS, Take 1 tablet by mouth daily. With Chromium 250 mg, Disp: , Rfl:    ondansetron (ZOFRAN) 8 MG tablet, Take by mouth every 8 (eight) hours as needed for nausea or vomiting., Disp: , Rfl:    pantoprazole (PROTONIX) 40 MG tablet, Take 40 mg by mouth 2 (two) times daily., Disp: , Rfl:    polyethylene glycol powder (GLYCOLAX/MIRALAX) 17 GM/SCOOP powder, Take 17 g by mouth daily as needed for moderate constipation., Disp: , Rfl:    pyridOXINE (VITAMIN B-6) 100 MG tablet, Take 100 mg by mouth daily., Disp: , Rfl:    Selenium 200 MCG CAPS, Take 200 mcg by mouth daily., Disp: , Rfl:    Sennosides (SENNA) 8.6 MG CAPS, Take 1 capsule by mouth daily as needed (constipation)., Disp: , Rfl:    sucralfate (CARAFATE) 1 GM/10ML suspension, Take 1 g by mouth daily., Disp: , Rfl:    Tiotropium Bromide-Olodaterol (STIOLTO RESPIMAT) 2.5-2.5 MCG/ACT AERS, Inhale 2 puffs into the lungs daily., Disp: 4 g, Rfl: 10   tobramycin-dexamethasone (TOBRADEX) ophthalmic solution, SMARTSIG:1 Drop(s) In Eye(s) 2-3 Times Daily PRN, Disp: , Rfl:    triamcinolone (NASACORT) 55 MCG/ACT AERO nasal inhaler, Place 2 sprays into the nose daily., Disp: , Rfl:    UBRELVY 100 MG TABS, Take 100 mg by mouth daily as needed (migraines)., Disp: , Rfl:    Vitamin A 2400 MCG (8000 UT) CAPS,  Take 8,000 Units by mouth daily., Disp: , Rfl:    Vitamin E 200 units TABS, Take 200 Units by mouth daily., Disp: , Rfl:    Wheat Dextrin (EQ FIBER POWDER PO), Take by mouth., Disp: , Rfl:    cetirizine (ZYRTEC) 5 MG tablet, Take 1 tablet (5 mg total) by mouth daily. (Patient not taking: Reported on 12/29/2021), Disp: 30 tablet, Rfl: 0   chlorhexidine (PERIDEX) 0.12 % solution, SMARTSIG:By Mouth (Patient not taking: Reported on 11/17/2021), Disp: , Rfl:    gabapentin (NEURONTIN) 100 MG capsule, Take 100 mg by mouth 3 (three) times daily. (Patient not taking: Reported on 10/06/2021), Disp: , Rfl:    Garlic 3474 MG CAPS, Take 1,000 mg by mouth daily. (Patient not taking: Reported on 12/29/2021), Disp: , Rfl:    HYDROcodone-acetaminophen (NORCO/VICODIN) 5-325 MG tablet, 1-2 tabs po bid prn (Patient not taking: Reported on 11/17/2021), Disp: 10 tablet, Rfl: 0   meclizine (ANTIVERT) 25 MG tablet, Take 1 tablet (25 mg total) by mouth every 6 (six) hours as needed for dizziness. (Patient not taking: Reported on 11/17/2021), Disp: 30 tablet, Rfl: 0  Physical exam:  Vitals:   12/29/21 0948  BP: 134/71  Pulse: 93  Resp: 18  SpO2: 98%  Weight: 174 lb (78.9 kg)   Physical Exam Constitutional:      General: She is not in acute distress. Cardiovascular:     Rate and Rhythm: Normal rate and regular rhythm.     Heart sounds: Normal heart sounds.  Pulmonary:     Effort: Pulmonary effort is normal.     Comments: Mild scattered wheezing Abdominal:     General: Bowel sounds are normal.  Palpations: Abdomen is soft.  Skin:    General: Skin is warm and dry.  Neurological:     Mental Status: She is alert and oriented to person, place, and time.         Latest Ref Rng & Units 12/29/2021    9:02 AM  CMP  Glucose 70 - 99 mg/dL 104   BUN 8 - 23 mg/dL 10   Creatinine 0.44 - 1.00 mg/dL 0.66   Sodium 135 - 145 mmol/L 139   Potassium 3.5 - 5.1 mmol/L 3.7   Chloride 98 - 111 mmol/L 106   CO2 22 - 32  mmol/L 27   Calcium 8.9 - 10.3 mg/dL 9.1   Total Protein 6.5 - 8.1 g/dL 6.5   Total Bilirubin 0.3 - 1.2 mg/dL 0.7   Alkaline Phos 38 - 126 U/L 91   AST 15 - 41 U/L 32   ALT 0 - 44 U/L 22       Latest Ref Rng & Units 12/29/2021    9:02 AM  CBC  WBC 4.0 - 10.5 K/uL 4.8   Hemoglobin 12.0 - 15.0 g/dL 11.2   Hematocrit 36.0 - 46.0 % 35.0   Platelets 150 - 400 K/uL 313      Assessment and plan- Patient is a 76 y.o. 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  She is here for on treatment assessment prior to cycle 6 of maintenance Alimta  Counts okay to proceed with cycle 6 of maintenance Alimta today.  I will see her back in 3 weeks for cycle 7.  So far hemoglobin has remained stable between 10-11.  No evidence of AKI.  She will receive B12 injections with every treatment.  Repeat scans will be due in early December 2023.   Visit Diagnosis 1. Malignant neoplasm of upper lobe of left lung ([place])   2. Encounter for antineoplastic chemotherapy      Dr. Randa Evens, MD, MPH [facility] [phone] 12/29/2021 12:24 PM

## 2021-12-29 NOTE — Progress Notes (Signed)
Pt states she is not feeling better; congestion seems to be getting worse has tried mucinex and other OTC and does not seem to be helping. As well as, dealing with more frequent vertigo episodes; had a vertigo tx done in [place] and seemed to help but episodes seems to be getting worse.

## 2022-01-18 MED FILL — Dexamethasone Sodium Phosphate Inj 100 MG/10ML: INTRAMUSCULAR | Qty: 1 | Status: AC

## 2022-01-19 ENCOUNTER — Inpatient Hospital Stay: Payer: Medicare Other

## 2022-01-19 ENCOUNTER — Inpatient Hospital Stay (HOSPITAL_BASED_OUTPATIENT_CLINIC_OR_DEPARTMENT_OTHER): Payer: Medicare Other | Admitting: Oncology

## 2022-01-19 ENCOUNTER — Inpatient Hospital Stay: Payer: Medicare Other | Attending: Oncology

## 2022-01-19 ENCOUNTER — Encounter: Payer: Self-pay | Admitting: Oncology

## 2022-01-19 VITALS — BP 136/64 | HR 94 | Temp 96.8°F | Resp 16 | Wt 174.7 lb

## 2022-01-19 VITALS — BP 130/82 | HR 81 | Resp 16

## 2022-01-19 DIAGNOSIS — Z5111 Encounter for antineoplastic chemotherapy: Secondary | ICD-10-CM | POA: Diagnosis not present

## 2022-01-19 DIAGNOSIS — C3412 Malignant neoplasm of upper lobe, left bronchus or lung: Secondary | ICD-10-CM

## 2022-01-19 DIAGNOSIS — T451X5A Adverse effect of antineoplastic and immunosuppressive drugs, initial encounter: Secondary | ICD-10-CM | POA: Diagnosis not present

## 2022-01-19 DIAGNOSIS — D6481 Anemia due to antineoplastic chemotherapy: Secondary | ICD-10-CM | POA: Insufficient documentation

## 2022-01-19 LAB — CBC WITH DIFFERENTIAL/PLATELET
Abs Immature Granulocytes: 0.01 10*3/uL (ref 0.00–0.07)
Basophils Absolute: 0 10*3/uL (ref 0.0–0.1)
Basophils Relative: 1 %
Eosinophils Absolute: 0.1 10*3/uL (ref 0.0–0.5)
Eosinophils Relative: 2 %
HCT: 32.6 % — ABNORMAL LOW (ref 36.0–46.0)
Hemoglobin: 10.6 g/dL — ABNORMAL LOW (ref 12.0–15.0)
Immature Granulocytes: 0 %
Lymphocytes Relative: 15 %
Lymphs Abs: 0.6 10*3/uL — ABNORMAL LOW (ref 0.7–4.0)
MCH: 31.2 pg (ref 26.0–34.0)
MCHC: 32.5 g/dL (ref 30.0–36.0)
MCV: 95.9 fL (ref 80.0–100.0)
Monocytes Absolute: 0.5 10*3/uL (ref 0.1–1.0)
Monocytes Relative: 13 %
Neutro Abs: 2.7 10*3/uL (ref 1.7–7.7)
Neutrophils Relative %: 69 %
Platelets: 359 10*3/uL (ref 150–400)
RBC: 3.4 MIL/uL — ABNORMAL LOW (ref 3.87–5.11)
RDW: 15.2 % (ref 11.5–15.5)
WBC: 4 10*3/uL (ref 4.0–10.5)
nRBC: 0 % (ref 0.0–0.2)

## 2022-01-19 LAB — COMPREHENSIVE METABOLIC PANEL
ALT: 16 U/L (ref 0–44)
AST: 27 U/L (ref 15–41)
Albumin: 3.6 g/dL (ref 3.5–5.0)
Alkaline Phosphatase: 94 U/L (ref 38–126)
Anion gap: 8 (ref 5–15)
BUN: 11 mg/dL (ref 8–23)
CO2: 26 mmol/L (ref 22–32)
Calcium: 9 mg/dL (ref 8.9–10.3)
Chloride: 104 mmol/L (ref 98–111)
Creatinine, Ser: 0.54 mg/dL (ref 0.44–1.00)
GFR, Estimated: >60 mL/min (ref >60)
Glucose, Bld: 97 mg/dL (ref 70–99)
Potassium: 3.9 mmol/L (ref 3.5–5.1)
Sodium: 138 mmol/L (ref 135–145)
Total Bilirubin: 0.5 mg/dL (ref 0.3–1.2)
Total Protein: 6.2 g/dL — ABNORMAL LOW (ref 6.5–8.1)

## 2022-01-19 MED ORDER — SODIUM CHLORIDE 0.9 % IV SOLN
500.0000 mg/m2 | Freq: Once | INTRAVENOUS | Status: AC
  Administered 2022-01-19: 900 mg via INTRAVENOUS
  Filled 2022-01-19: qty 20

## 2022-01-19 MED ORDER — HEPARIN SOD (PORK) LOCK FLUSH 100 UNIT/ML IV SOLN
INTRAVENOUS | Status: AC
  Filled 2022-01-19: qty 5

## 2022-01-19 MED ORDER — CYANOCOBALAMIN 1000 MCG/ML IJ SOLN
1000.0000 ug | Freq: Once | INTRAMUSCULAR | Status: AC
  Administered 2022-01-19: 1000 ug via INTRAMUSCULAR
  Filled 2022-01-19: qty 1

## 2022-01-19 MED ORDER — SODIUM CHLORIDE 0.9 % IV SOLN
Freq: Once | INTRAVENOUS | Status: AC
  Filled 2022-01-19: qty 250

## 2022-01-19 MED ORDER — HEPARIN SOD (PORK) LOCK FLUSH 100 UNIT/ML IV SOLN
500.0000 [IU] | Freq: Once | INTRAVENOUS | Status: AC | PRN
  Administered 2022-01-19: 500 [IU]
  Filled 2022-01-19: qty 5

## 2022-01-19 MED ORDER — SODIUM CHLORIDE 0.9 % IV SOLN
10.0000 mg | Freq: Once | INTRAVENOUS | Status: AC
  Administered 2022-01-19: 10 mg via INTRAVENOUS
  Filled 2022-01-19: qty 10

## 2022-01-19 NOTE — Progress Notes (Signed)
   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  EGFR and Ros as well as MET testing negative. PD-L1 was 50%   Patient completed concurrent chemoradiation with weekly carbotaxol chemotherapy on 03/12/2019.  Maintenance durvalumab started in January 2021 after scan showed partial response.  Patient completed 1 year of maintenance durvalumab in February 2022   Patient was found to have enlarging mediastinal adenopathyOn subsequent CT scan in January 2023.  This was followed by a PET scan which showed hypermetabolic mediastinal and right hilar lymph nodes but no other evidence of disease elsewhere.  This was biopsied and was consistent with non-small cell lung cancer specifically adenocarcinoma.  Cells were positive for TTF-1 and Napsin A and negative for p40.   Patient completed concurrent chemoradiation with 4 cycles of carbo Alimta ending in May 2023.  Scan showed partial response.  Plan is for maintenance Alimta until progression or toxicity       Interval history-patient has chronic cough and wheezing and Pediotic flareup of bronchitis.She also  has ongoing bilateral lower extremity swelling.  ECOG PS- 1 Pain scale- 2 Opioid associated constipation- no  Review of systems- Review of Systems  Constitutional:  Positive for malaise/fatigue. Negative for chills, fever and weight loss.  HENT:  Negative for congestion, ear discharge and nosebleeds.   Eyes:  Negative for blurred vision.  Respiratory:  Positive for cough. Negative for hemoptysis, sputum production, shortness of breath and wheezing.   Cardiovascular:  Positive for leg swelling. Negative for chest pain, palpitations, orthopnea and claudication.  Gastrointestinal:  Negative for abdominal pain, blood in stool, constipation, diarrhea, heartburn, melena, nausea and vomiting.  Genitourinary:  Negative for dysuria, flank pain, frequency, hematuria and urgency.  Musculoskeletal:  Negative for back pain, joint pain and myalgias.  Skin:  Negative for rash.  Neurological:  Negative for dizziness, tingling, focal weakness, seizures, weakness and headaches.  Endo/Heme/Allergies:  Does not bruise/bleed easily.  Psychiatric/Behavioral:  Negative for depression and suicidal ideas. The patient does not have insomnia.       Allergies  Allergen Reactions   Aspirin Hives, Shortness Of Breath and Other (See Comments)    Difficulty breathing   Celebrex [Celecoxib] Shortness Of Breath   Morphine And Related Nausea And Vomiting and Swelling   Adhesive [Tape] Other (See Comments)    Took top layer of skin off when removed.   Clarithromycin Nausea And Vomiting   Codeine Nausea And Vomiting   Darvon [Propoxyphene Hcl] Nausea And Vomiting   Demerol [Meperidine] Nausea And Vomiting   Flonase [Fluticasone Propionate] Other (See Comments)    Fungal infection in nose   Simvastatin Other (See Comments)    "caused ulcers in mouth, and fever"   Talwin [  Pentazocine] Nausea And Vomiting     Past Medical History:  Diagnosis Date   Anemia    Asthma    No Inhalers--Dr. [name] will order as  needed   Bronchiectasis ([place])    mild   Chronic headaches     followed by [facility] migraines   COPD (chronic obstructive pulmonary disease) (HCC)    DDD (degenerative disc disease), lumbar    Diverticulosis    Family history of adverse reaction to anesthesia    mother and sisters-PONV   Gall stones    history of   GERD (gastroesophageal reflux disease)    EGD 8/09- non bleeding erosive gastritis, documentd esophageal ulcerations.    Hiatal hernia    small   History of kidney stones    History of pneumonia    Hypercholesterolemia    IBS (irritable bowel syndrome)    Malignant neoplasm of upper lobe of left lung ([place]) 12/27/2018   Meniere disease    Murmur    Osteoarthritis    lumbar disc disease, left hip   Personal history of chemotherapy    Personal history of radiation therapy    Pneumonia 12/2020   PONV (postoperative nausea and vomiting)    Sleep apnea    uses cpap   Vertigo    Weakness of right side of body      Past Surgical History:  Procedure Laterality Date   ABDOMINAL HYSTERECTOMY  age 74   ANTERIOR CERVICAL DECOMP/DISCECTOMY FUSION N/A 02/24/2015   Procedure: CERVICAL FOUR-FIVE, CERVICAL FIVE-SIX, CERVICAL SIX-SEVEN ANTERIOR CERVICAL DECOMPRESSION/DISCECTOMY FUSION ;  Surgeon: [name], MD;  Location: [place] [facility];  Service: Neurosurgery;  Laterality: N/A;  C45 C56 C67 anterior cervical decompression with fusion interbody prosthesis plating and bonegraft   APPENDECTOMY     BACK SURGERY  1988   4th lumbar fusion   BLADDER SURGERY N/A    with vaginal wall repair   BREAST CYST ASPIRATION Bilateral    neg   BREAST SURGERY Bilateral    cyst removed and reduction   CARDIAC CATHETERIZATION  2014   CARPAL TUNNEL RELEASE Right 02/11/2016   Procedure: CARPAL TUNNEL RELEASE;  Surgeon: [name], MD;  Location: [facility] ORS;  Service: Orthopedics;  Laterality: Right;   CATARACT EXTRACTION W/ INTRAOCULAR LENS IMPLANT Bilateral 2015   CHOLECYSTECTOMY      EUS N/A 05/31/2012   Procedure: UPPER ENDOSCOPIC ULTRASOUND (EUS) LINEAR;  Surgeon: [name], MD;  Location: [facility];  Service: Endoscopy;  Laterality: N/A;   EXCISIONAL HEMORRHOIDECTOMY     JOINT REPLACEMENT Bilateral    KNEE ARTHROSCOPY WITH LATERAL MENISECTOMY Right 07/07/2015   Procedure: KNEE ARTHROSCOPY WITH LATERAL MENISECTOMY, PARTIAL SYNOVECTOMY;  Surgeon: [name], MD;  Location: [facility];  Service: Orthopedics;  Laterality: Right;   LUMBAR LAMINECTOMY     PORTA CATH INSERTION N/A 01/07/2019   Procedure: PORTA CATH INSERTION;  Surgeon: [name], MD;  Location: [place] [facility];  Service: Cardiovascular;  Laterality: N/A;   REDUCTION MAMMAPLASTY  1990   RIGHT OOPHORECTOMY     TOTAL HIP ARTHROPLASTY Left 05/01/2014   Dr. [name]   TOTAL HIP ARTHROPLASTY Right 08/05/2014   Procedure: TOTAL HIP ARTHROPLASTY ANTERIOR APPROACH;  Surgeon: [name], MD;  Location: [facility] OR[facility];  Service: Orthopedics;  Laterality: Right;   ULNAR NERVE TRANSPOSITION Right 02/11/2016   Procedure: ULNAR NERVE DECOMPRESSION/TRANSPOSITION;  Surgeon: [name], MD;  Location: [facility];  Service: Orthopedics;  Laterality: Right;   VIDEO BRONCHOSCOPY WITH ENDOBRONCHIAL  ULTRASOUND Left 12/19/2018   Procedure: VIDEO BRONCHOSCOPY WITH ENDOBRONCHIAL ULTRASOUND, LEFT, SLEEP APNEA;  Surgeon: Aleskerov, Fuad, MD;  Location: [facility] OR[facility];  Service: Thoracic;  Laterality: Left;   VIDEO BRONCHOSCOPY WITH ENDOBRONCHIAL ULTRASOUND Right 05/14/2021   Procedure: VIDEO BRONCHOSCOPY WITH ENDOBRONCHIAL ULTRASOUND;  Surgeon: Gonzalez, Carmen L, MD;  Location: [facility];  Service: Pulmonary;  Laterality: Right;    Social History   Socioeconomic History   Marital status: Married    Spouse name: Not on file   Number of children: Not on file   Years of education: Not on file   Highest education level: Not on file  Occupational History   Not on file  Tobacco Use   Smoking status: Former     Packs/day: 1.00    Years: 40.00    Total pack years: 40.00    Types: Cigarettes    Quit date: 06/13/2007    Years since quitting: 14.6   Smokeless tobacco: Never  Vaping Use   Vaping Use: Never used  Substance and Sexual Activity   Alcohol use: No    Alcohol/week: 0.0 standard drinks of alcohol   Drug use: No   Sexual activity: Not on file  Other Topics Concern   Not on file  Social History Narrative   Not on file   Social Determinants of Health   Financial Resource Strain: Not on file  Food Insecurity: Not on file  Transportation Needs: Not on file  Physical Activity: Not on file  Stress: Not on file  Social Connections: Not on file  Intimate Partner Violence: Not on file    Family History  Problem Relation Age of Onset   Heart disease Mother        s/p stent   Hypertension Mother    Hypercholesterolemia Mother    Diabetes Father    Stomach cancer Other        uncle   Breast cancer Cousin    Breast cancer Paternal Aunt      Current Outpatient Medications:    acetaminophen (TYLENOL) 500 MG tablet, Take 500 mg by mouth 2 (two) times daily., Disp: , Rfl:    albuterol (VENTOLIN HFA) 108 (90 Base) MCG/ACT inhaler, Inhale 2 puffs into the lungs every 6 (six) hours as needed for wheezing or shortness of breath., Disp: , Rfl:    Bioflavonoid Products (ESTER C PO), Take 1 tablet by mouth 3 (three) times daily. 500 mg, Disp: , Rfl:    Calcium-Magnesium-Zinc (CAL-MAG-ZINC PO), Take 1 tablet by mouth daily., Disp: , Rfl:    carboxymethylcellulose (REFRESH PLUS) 0.5 % SOLN, Place 1 drop into both eyes 3 (three) times daily as needed (dry eyes)., Disp: , Rfl:    Cholecalciferol (EQL VITAMIN D3) 25 MCG (1000 UT) capsule, Take 1,000 Units by mouth daily., Disp: , Rfl:    Coenzyme Q10 (CO Q 10) 100 MG CAPS, Take 100 mg by mouth daily. , Disp: , Rfl:    Cyanocobalamin (VITAMIN B-12) 2500 MCG SUBL, Place 2,500 mcg under the tongue daily., Disp: , Rfl:    cyclobenzaprine (FLEXERIL) 10  MG tablet, Take 10 mg by mouth at bedtime., Disp: , Rfl:    DHEA 25 MG CAPS, Take 25 mg by mouth daily., Disp: , Rfl:    ezetimibe (ZETIA) 10 MG tablet, Take 5 mg by mouth in the morning and at bedtime., Disp: , Rfl:    folic acid (FOLVITE) 400 MCG tablet, Take 400 mcg by mouth daily., Disp: , Rfl:      Histamine Dihydrochloride (AUSTRALIAN DREAM ARTHRITIS) 0.025 % CREA, Apply 1 application. topically 4 (four) times daily as needed (pain)., Disp: , Rfl:    levOCARNitine (L-CARNITINE) 250 MG TABS, Take 1 tablet by mouth daily. With Chromium 250 mg, Disp: , Rfl:    ondansetron (ZOFRAN) 8 MG tablet, Take by mouth every 8 (eight) hours as needed for nausea or vomiting., Disp: , Rfl:    pantoprazole (PROTONIX) 40 MG tablet, Take 40 mg by mouth 2 (two) times daily., Disp: , Rfl:    polyethylene glycol powder (GLYCOLAX/MIRALAX) 17 GM/SCOOP powder, Take 17 g by mouth daily as needed for moderate constipation., Disp: , Rfl:    pyridOXINE (VITAMIN B-6) 100 MG tablet, Take 100 mg by mouth daily., Disp: , Rfl:    Selenium 200 MCG CAPS, Take 200 mcg by mouth daily., Disp: , Rfl:    Sennosides (SENNA) 8.6 MG CAPS, Take 1 capsule by mouth daily as needed (constipation)., Disp: , Rfl:    sucralfate (CARAFATE) 1 GM/10ML suspension, Take 1 g by mouth daily., Disp: , Rfl:    Tiotropium Bromide-Olodaterol (STIOLTO RESPIMAT) 2.5-2.5 MCG/ACT AERS, Inhale 2 puffs into the lungs daily., Disp: 4 g, Rfl: 10   tobramycin-dexamethasone (TOBRADEX) ophthalmic solution, SMARTSIG:1 Drop(s) In Eye(s) 2-3 Times Daily PRN, Disp: , Rfl:    triamcinolone (NASACORT) 55 MCG/ACT AERO nasal inhaler, Place 2 sprays into the nose daily., Disp: , Rfl:    UBRELVY 100 MG TABS, Take 100 mg by mouth daily as needed (migraines)., Disp: , Rfl:    Vitamin A 2400 MCG (8000 UT) CAPS, Take 8,000 Units by mouth daily., Disp: , Rfl:    Vitamin E 200 units TABS, Take 200 Units by mouth daily., Disp: , Rfl:    Wheat Dextrin (EQ FIBER POWDER PO), Take by  mouth., Disp: , Rfl:    cetirizine (ZYRTEC) 5 MG tablet, Take 1 tablet (5 mg total) by mouth daily. (Patient not taking: Reported on 12/29/2021), Disp: 30 tablet, Rfl: 0   chlorhexidine (PERIDEX) 0.12 % solution, SMARTSIG:By Mouth (Patient not taking: Reported on 11/17/2021), Disp: , Rfl:    gabapentin (NEURONTIN) 100 MG capsule, Take 100 mg by mouth 3 (three) times daily. (Patient not taking: Reported on 10/06/2021), Disp: , Rfl:    Garlic 7741 MG CAPS, Take 1,000 mg by mouth daily. (Patient not taking: Reported on 12/29/2021), Disp: , Rfl:    HYDROcodone-acetaminophen (NORCO/VICODIN) 5-325 MG tablet, 1-2 tabs po bid prn (Patient not taking: Reported on 11/17/2021), Disp: 10 tablet, Rfl: 0   meclizine (ANTIVERT) 25 MG tablet, Take 1 tablet (25 mg total) by mouth every 6 (six) hours as needed for dizziness. (Patient not taking: Reported on 11/17/2021), Disp: 30 tablet, Rfl: 0 No current facility-administered medications for this visit.  Facility-Administered Medications Ordered in Other Visits:    heparin lock flush 100 unit/mL, 500 Units, Intracatheter, Once PRN, Sindy Guadeloupe, MD   PEMEtrexed (ALIMTA) 900 mg in sodium chloride 0.9 % 100 mL chemo infusion, 500 mg/m2 (Treatment Plan Recorded), Intravenous, Once, Sindy Guadeloupe, MD  Physical exam:  Vitals:   01/19/22 0951  BP: 136/64  Pulse: 94  Resp: 16  Temp: (!) 96.8 F (36 C)  SpO2: 96%  Weight: 174 lb 11.2 oz (79.2 kg)   Physical Exam Constitutional:      General: She is not in acute distress. Cardiovascular:     Rate and Rhythm: Normal rate and regular rhythm.     Heart sounds: Normal heart sounds.  Pulmonary:  Effort: Pulmonary effort is normal.     Comments: Scattered bilateral wheezing Abdominal:     General: Bowel sounds are normal.     Palpations: Abdomen is soft.  Musculoskeletal:     Right lower leg: Edema present.     Left lower leg: Edema present.  Skin:    General: Skin is warm and dry.  Neurological:     Mental  Status: She is alert and oriented to person, place, and time.         Latest Ref Rng & Units 01/19/2022    9:18 AM  CMP  Glucose 70 - 99 mg/dL 97   BUN 8 - 23 mg/dL 11   Creatinine 0.44 - 1.00 mg/dL 0.54   Sodium 135 - 145 mmol/L 138   Potassium 3.5 - 5.1 mmol/L 3.9   Chloride 98 - 111 mmol/L 104   CO2 22 - 32 mmol/L 26   Calcium 8.9 - 10.3 mg/dL 9.0   Total Protein 6.5 - 8.1 g/dL 6.2   Total Bilirubin 0.3 - 1.2 mg/dL 0.5   Alkaline Phos 38 - 126 U/L 94   AST 15 - 41 U/L 27   ALT 0 - 44 U/L 16       Latest Ref Rng & Units 01/19/2022    9:18 AM  CBC  WBC 4.0 - 10.5 K/uL 4.0   Hemoglobin 12.0 - 15.0 g/dL 10.6   Hematocrit 36.0 - 46.0 % 32.6   Platelets 150 - 400 K/uL 359      Assessment and plan- Patient is a 76 y.o. 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   She is here for on treatment assessment prior to cycle 7 of maintenance Alimta  Counts okay to proceed with cycle 7 of maintenance Alimta today.  I will see her back in 3 weeks for cycle 8.  Plan to repeat scans after8 cycles.  Her hemoglobin has been gradually drifting down from 12 now presently to 10.6 likely secondary to chemotherapy.  No evidence of AKI.  Patient also reports having more fatigue.  It is unclear if her bilateral lower extremity edema secondary to Alimta.  It is a side effect with Alimta in about 5% of the cases.  I will consider giving her a temporary break from chemotherapy if scans overall show stable disease after 8 cycles and see if her anemia and symptoms of fatigue and leg swelling improved after that.  Patient will continue oral folic acid supplements and is getting her B12 injection today   Visit Diagnosis 1. Malignant neoplasm of upper lobe of left lung ([place])   2. Encounter for antineoplastic chemotherapy   3. Antineoplastic chemotherapy induced anemia      Dr. Randa Evens, MD, MPH [facility] at [facility] [phone] 01/19/2022 11:00 AM

## 2022-01-19 NOTE — Patient Instructions (Signed)
[facility] CANCER CTR AT [place]  Discharge Instructions: Thank you for choosing [place] to provide your oncology and hematology care.  If you have a lab appointment with the [facility], please go directly to the [facility] and check in at the registration area.  Wear comfortable clothing and clothing appropriate for easy access to any Portacath or PICC line.   We strive to give you quality time with your provider. You may need to reschedule your appointment if you arrive late (15 or more minutes).  Arriving late affects you and other patients whose appointments are after yours.  Also, if you miss three or more appointments without notifying the office, you may be dismissed from the clinic at the provider's discretion.      For prescription refill requests, have your pharmacy contact our office and allow 72 hours for refills to be completed.    Today you received the following chemotherapy and/or immunotherapy agents alimta      To help prevent nausea and vomiting after your treatment, we encourage you to take your nausea medication as directed.  BELOW ARE SYMPTOMS THAT SHOULD BE REPORTED IMMEDIATELY: *FEVER GREATER THAN 100.4 F (38 C) OR HIGHER *CHILLS OR SWEATING *NAUSEA AND VOMITING THAT IS NOT CONTROLLED WITH YOUR NAUSEA MEDICATION *UNUSUAL SHORTNESS OF BREATH *UNUSUAL BRUISING OR BLEEDING *URINARY PROBLEMS (pain or burning when urinating, or frequent urination) *BOWEL PROBLEMS (unusual diarrhea, constipation, pain near the anus) TENDERNESS IN MOUTH AND THROAT WITH OR WITHOUT PRESENCE OF ULCERS (sore throat, sores in mouth, or a toothache) UNUSUAL RASH, SWELLING OR PAIN  UNUSUAL VAGINAL DISCHARGE OR ITCHING   Items with * indicate a potential emergency and should be followed up as soon as possible or go to the Emergency Department if any problems should occur.  Please show the CHEMOTHERAPY ALERT CARD or IMMUNOTHERAPY ALERT CARD at check-in to the  Emergency Department and triage nurse.  Should you have questions after your visit or need to cancel or reschedule your appointment, please contact [facility] CANCER [place] AT [place]  [phone] and follow the prompts.  Office hours are 8:00 a.m. to 4:30 p.m. Monday - Friday. Please note that voicemails left after 4:00 p.m. may not be returned until the following business day.  We are closed weekends and major holidays. You have access to a nurse at all times for urgent questions. Please call the main number to the clinic [phone] and follow the prompts.  For any non-urgent questions, you may also contact your provider using MyChart. We now offer e-Visits for anyone 73 and older to request care online for non-urgent symptoms. For details visit [email or web address].   Also download the MyChart app! Go to the app store, search "MyChart", open the app, select [place], and log in with your MyChart username and password.  Masks are optional in the cancer centers. If you would like for your care team to wear a mask while they are taking care of you, please let them know. For doctor visits, patients may have with them one support person who is at least 77 years old. At this time, visitors are not allowed in the infusion area.

## 2022-01-19 NOTE — Progress Notes (Signed)
Pt states ever since starting the ALIMENTA pt does not have any energy and a bland taste with every food or liquid. Along, with chronic bronchitis with her cough that keeps getting worse. States every bone is her body aches and legs and feet are swollen and tends to get worse throughout the day.

## 2022-01-20 DIAGNOSIS — R600 Localized edema: Secondary | ICD-10-CM | POA: Insufficient documentation

## 2022-01-21 ENCOUNTER — Inpatient Hospital Stay (HOSPITAL_BASED_OUTPATIENT_CLINIC_OR_DEPARTMENT_OTHER): Payer: Medicare Other | Admitting: Hospice and Palliative Medicine

## 2022-01-21 ENCOUNTER — Telehealth: Payer: Self-pay | Admitting: *Deleted

## 2022-01-21 ENCOUNTER — Telehealth: Payer: Medicare Other | Admitting: Oncology

## 2022-01-21 ENCOUNTER — Ambulatory Visit
Admission: RE | Admit: 2022-01-21 | Discharge: 2022-01-21 | Disposition: A | Discharge status: Home / Self Care | Payer: Medicare Other | Source: Ambulatory Visit | Attending: Hospice and Palliative Medicine | Admitting: Hospice and Palliative Medicine

## 2022-01-21 DIAGNOSIS — C3412 Malignant neoplasm of upper lobe, left bronchus or lung: Secondary | ICD-10-CM

## 2022-01-21 MED ORDER — GADOBUTROL 1 MMOL/ML IV SOLN
7.0000 mL | Freq: Once | INTRAVENOUS | Status: AC | PRN
  Administered 2022-01-21: 7 mL via INTRAVENOUS

## 2022-01-21 NOTE — Progress Notes (Signed)
Virtual Visit via Telephone Note  I connected with Jennifer Hodges on 01/21/22 at 11:00 AM EST by telephone and verified that I am speaking with the correct person using two identifiers.  Location: Patient: Home Provider: Clinic   I discussed the limitations, risks, security and privacy concerns of performing an evaluation and management service by telephone and the availability of in person appointments. I also discussed with the patient that there may be a patient responsible charge related to this service. The patient expressed understanding and agreed to proceed.   History of Present Illness: Jennifer Hodges is a 76 y.o. 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  Patient completed 4 cycles of carbo Alimta in May 2023 with scans showing partial response and plan for maintenance Alimta until progression or toxicity   Observations/Objective: Patient called into the clinic reporting 24 hours of headache localized to the L. occipital area.  She feels like there is also some swelling to that area on the left side.  She denies any falls or trauma.  She has chronic vertigo but denies any presently.  No visual changes or changes in weakness, speech, functioning, or cognition.  Patient reports that she took some Tylenol earlier today and that has significantly improved her symptoms.  She says that she has checked her vitals today with blood pressure 135/79 and heart rate 99.  Assessment and Plan: Headache -patient has history of chronic migraines and has Ubrelvy prescribed for that.  Pain seems to have lessened after patient utilized acetaminophen.  However, I am concerned with report that there is swelling at the site of pain.  I recommended that she have evaluation in the emergency  department but she declined that and stated that she wanted to remain at home and see if symptoms continue to improve.  Patient said that she would agree to go to the ED for any changes or worsening symptoms.  We will proceed with ordering an MRI of the brain as last imaging was in March 2023.  Case and plan discussed with Dr. Janese Banks  Follow Up Instructions: ED if any changes or worsening in symptoms, otherwise patient to follow-up with Dr. Janese Banks as previously scheduled   I discussed the assessment and treatment plan with the patient. The patient was provided an opportunity to ask questions and all were answered. The patient agreed with the plan and demonstrated an understanding of the instructions.   The patient was advised to call back or seek an in-person evaluation if the symptoms worsen or if the condition fails to improve as anticipated.  I provided 15 minutes of non-face-to-face time during this encounter.   Irean Hong, NP

## 2022-01-21 NOTE — Telephone Encounter (Signed)
Patient called reporting that she is having a different kind of very bad headache this morning. She has pain in the back left side "near the brain stem" ,there is swelling in the area. She took Tylenol and states that she is getting ready to take a Ubrelvy for it. She is asking for a return call to discuss this because it is not her typical migraine.She is asking where to go from here with it.

## 2022-01-27 ENCOUNTER — Encounter: Payer: Self-pay | Admitting: Family Medicine

## 2022-01-27 DIAGNOSIS — Z1231 Encounter for screening mammogram for malignant neoplasm of breast: Secondary | ICD-10-CM

## 2022-01-31 ENCOUNTER — Telehealth: Payer: Self-pay | Admitting: *Deleted

## 2022-01-31 NOTE — Telephone Encounter (Signed)
Go to ER

## 2022-01-31 NOTE — Telephone Encounter (Signed)
Patient called reporting that her sister just died an hour or 2 ago and she is now having right lung pain when she stands or walks. She said that she is using an inhaler fro wheezing, but has another one and is asking if she should use the second inhaler or what she should do because she is having trouble breathing. Please advise if she should go to ER or what to do.

## 2022-01-31 NOTE — Telephone Encounter (Signed)
Spoke to patient who was gasping for air as she was speaking saying that it came all of a sudden. Advised her to get evaluated by ED as advised by Dr. Janese Banks. She agrees and verbalizes understanding

## 2022-02-01 NOTE — Telephone Encounter (Signed)
Called pt to check on her breathing since pt did not go to ER yesterday as advised. Per pt she feels a little better today; still sounds very out of breath but states she has been up cooking since 545am since she has family in town. Pt is feeling down because her sister passed away yesterday and will not able to make it to the funeral due to it being 2.5 hours away. Pt stated she was near the ER last night but there were too many people so she didnt go. Pt also stated if she feels any worse she will most defiently go to the ER. Thanked me for calling to check on her.

## 2022-02-04 ENCOUNTER — Inpatient Hospital Stay: Payer: Medicare Other

## 2022-02-04 ENCOUNTER — Other Ambulatory Visit: Payer: Self-pay

## 2022-02-04 ENCOUNTER — Emergency Department: Payer: Medicare Other

## 2022-02-04 ENCOUNTER — Inpatient Hospital Stay
Admission: EM | Admit: 2022-02-04 | Discharge: 2022-02-09 | DRG: 177 | Disposition: A | Discharge status: Home / Self Care | Payer: Medicare Other | Attending: Internal Medicine | Admitting: Internal Medicine

## 2022-02-04 ENCOUNTER — Encounter: Payer: Self-pay | Admitting: Emergency Medicine

## 2022-02-04 DIAGNOSIS — J4489 Other specified chronic obstructive pulmonary disease: Secondary | ICD-10-CM | POA: Diagnosis present

## 2022-02-04 DIAGNOSIS — J690 Pneumonitis due to inhalation of food and vomit: Principal | ICD-10-CM | POA: Diagnosis present

## 2022-02-04 DIAGNOSIS — Z9842 Cataract extraction status, left eye: Secondary | ICD-10-CM

## 2022-02-04 DIAGNOSIS — Z515 Encounter for palliative care: Secondary | ICD-10-CM | POA: Diagnosis not present

## 2022-02-04 DIAGNOSIS — Z7960 Long term (current) use of unspecified immunomodulators and immunosuppressants: Secondary | ICD-10-CM | POA: Diagnosis not present

## 2022-02-04 DIAGNOSIS — Z79899 Other long term (current) drug therapy: Secondary | ICD-10-CM | POA: Diagnosis not present

## 2022-02-04 DIAGNOSIS — Z1152 Encounter for screening for COVID-19: Secondary | ICD-10-CM

## 2022-02-04 DIAGNOSIS — Z9989 Dependence on other enabling machines and devices: Secondary | ICD-10-CM

## 2022-02-04 DIAGNOSIS — C3491 Malignant neoplasm of unspecified part of right bronchus or lung: Secondary | ICD-10-CM | POA: Diagnosis not present

## 2022-02-04 DIAGNOSIS — E7800 Pure hypercholesterolemia, unspecified: Secondary | ICD-10-CM | POA: Diagnosis present

## 2022-02-04 DIAGNOSIS — J9601 Acute respiratory failure with hypoxia: Secondary | ICD-10-CM | POA: Diagnosis not present

## 2022-02-04 DIAGNOSIS — Z7952 Long term (current) use of systemic steroids: Secondary | ICD-10-CM

## 2022-02-04 DIAGNOSIS — I10 Essential (primary) hypertension: Secondary | ICD-10-CM | POA: Diagnosis not present

## 2022-02-04 DIAGNOSIS — Z8719 Personal history of other diseases of the digestive system: Secondary | ICD-10-CM

## 2022-02-04 DIAGNOSIS — Z886 Allergy status to analgesic agent status: Secondary | ICD-10-CM | POA: Diagnosis not present

## 2022-02-04 DIAGNOSIS — J90 Pleural effusion, not elsewhere classified: Secondary | ICD-10-CM | POA: Diagnosis not present

## 2022-02-04 DIAGNOSIS — B370 Candidal stomatitis: Secondary | ICD-10-CM | POA: Diagnosis present

## 2022-02-04 DIAGNOSIS — Z8701 Personal history of pneumonia (recurrent): Secondary | ICD-10-CM

## 2022-02-04 DIAGNOSIS — J3800 Paralysis of vocal cords and larynx, unspecified: Secondary | ICD-10-CM | POA: Diagnosis present

## 2022-02-04 DIAGNOSIS — Z87891 Personal history of nicotine dependence: Secondary | ICD-10-CM

## 2022-02-04 DIAGNOSIS — J181 Lobar pneumonia, unspecified organism: Secondary | ICD-10-CM | POA: Diagnosis present

## 2022-02-04 DIAGNOSIS — Z801 Family history of malignant neoplasm of trachea, bronchus and lung: Secondary | ICD-10-CM

## 2022-02-04 DIAGNOSIS — Z8249 Family history of ischemic heart disease and other diseases of the circulatory system: Secondary | ICD-10-CM

## 2022-02-04 DIAGNOSIS — C3412 Malignant neoplasm of upper lobe, left bronchus or lung: Secondary | ICD-10-CM | POA: Diagnosis present

## 2022-02-04 DIAGNOSIS — Z961 Presence of intraocular lens: Secondary | ICD-10-CM | POA: Diagnosis present

## 2022-02-04 DIAGNOSIS — J189 Pneumonia, unspecified organism: Secondary | ICD-10-CM | POA: Diagnosis not present

## 2022-02-04 DIAGNOSIS — Z96643 Presence of artificial hip joint, bilateral: Secondary | ICD-10-CM | POA: Diagnosis present

## 2022-02-04 DIAGNOSIS — G4730 Sleep apnea, unspecified: Secondary | ICD-10-CM | POA: Diagnosis present

## 2022-02-04 DIAGNOSIS — K219 Gastro-esophageal reflux disease without esophagitis: Secondary | ICD-10-CM | POA: Diagnosis present

## 2022-02-04 DIAGNOSIS — Z888 Allergy status to other drugs, medicaments and biological substances status: Secondary | ICD-10-CM

## 2022-02-04 DIAGNOSIS — B3781 Candidal esophagitis: Secondary | ICD-10-CM | POA: Diagnosis present

## 2022-02-04 DIAGNOSIS — G4733 Obstructive sleep apnea (adult) (pediatric): Secondary | ICD-10-CM | POA: Diagnosis present

## 2022-02-04 DIAGNOSIS — Z923 Personal history of irradiation: Secondary | ICD-10-CM | POA: Diagnosis not present

## 2022-02-04 DIAGNOSIS — Z885 Allergy status to narcotic agent status: Secondary | ICD-10-CM

## 2022-02-04 DIAGNOSIS — Z83438 Family history of other disorder of lipoprotein metabolism and other lipidemia: Secondary | ICD-10-CM

## 2022-02-04 DIAGNOSIS — Z9841 Cataract extraction status, right eye: Secondary | ICD-10-CM

## 2022-02-04 DIAGNOSIS — J9811 Atelectasis: Secondary | ICD-10-CM | POA: Diagnosis present

## 2022-02-04 DIAGNOSIS — M1990 Unspecified osteoarthritis, unspecified site: Secondary | ICD-10-CM | POA: Diagnosis present

## 2022-02-04 DIAGNOSIS — Z800 Family history of malignant neoplasm of digestive organs: Secondary | ICD-10-CM

## 2022-02-04 DIAGNOSIS — Z87442 Personal history of urinary calculi: Secondary | ICD-10-CM

## 2022-02-04 DIAGNOSIS — Z90721 Acquired absence of ovaries, unilateral: Secondary | ICD-10-CM

## 2022-02-04 DIAGNOSIS — Z66 Do not resuscitate: Secondary | ICD-10-CM | POA: Diagnosis present

## 2022-02-04 DIAGNOSIS — Y95 Nosocomial condition: Secondary | ICD-10-CM | POA: Diagnosis not present

## 2022-02-04 DIAGNOSIS — Z803 Family history of malignant neoplasm of breast: Secondary | ICD-10-CM

## 2022-02-04 DIAGNOSIS — Z90710 Acquired absence of both cervix and uterus: Secondary | ICD-10-CM

## 2022-02-04 DIAGNOSIS — Z91048 Other nonmedicinal substance allergy status: Secondary | ICD-10-CM

## 2022-02-04 DIAGNOSIS — K589 Irritable bowel syndrome without diarrhea: Secondary | ICD-10-CM | POA: Diagnosis present

## 2022-02-04 LAB — GLUCOSE, PLEURAL OR PERITONEAL FLUID: Glucose, Fluid: 104 mg/dL

## 2022-02-04 LAB — COMPREHENSIVE METABOLIC PANEL
ALT: 14 U/L (ref 0–44)
AST: 23 U/L (ref 15–41)
Albumin: 3.7 g/dL (ref 3.5–5.0)
Alkaline Phosphatase: 82 U/L (ref 38–126)
Anion gap: 9 (ref 5–15)
BUN: 10 mg/dL (ref 8–23)
CO2: 27 mmol/L (ref 22–32)
Calcium: 9.4 mg/dL (ref 8.9–10.3)
Chloride: 104 mmol/L (ref 98–111)
Creatinine, Ser: 0.57 mg/dL (ref 0.44–1.00)
GFR, Estimated: >60 mL/min (ref >60)
Glucose, Bld: 106 mg/dL — ABNORMAL HIGH (ref 70–99)
Potassium: 4.2 mmol/L (ref 3.5–5.1)
Sodium: 140 mmol/L (ref 135–145)
Total Bilirubin: 0.6 mg/dL (ref 0.3–1.2)
Total Protein: 6.7 g/dL (ref 6.5–8.1)

## 2022-02-04 LAB — PROCALCITONIN: Procalcitonin: <0.1 ng/mL

## 2022-02-04 LAB — CBC
HCT: 31.9 % — ABNORMAL LOW (ref 36.0–46.0)
Hemoglobin: 10.2 g/dL — ABNORMAL LOW (ref 12.0–15.0)
MCH: 30.4 pg (ref 26.0–34.0)
MCHC: 32 g/dL (ref 30.0–36.0)
MCV: 95.2 fL (ref 80.0–100.0)
Platelets: 295 10*3/uL (ref 150–400)
RBC: 3.35 MIL/uL — ABNORMAL LOW (ref 3.87–5.11)
RDW: 14.7 % (ref 11.5–15.5)
WBC: 4.9 10*3/uL (ref 4.0–10.5)
nRBC: 0 % (ref 0.0–0.2)

## 2022-02-04 LAB — BODY FLUID CELL COUNT WITH DIFFERENTIAL
Lymphs, Fluid: 23 %
Monocyte-Macrophage-Serous Fluid: 72 %
Neutrophil Count, Fluid: 5 %
Total Nucleated Cell Count, Fluid: 569 cu mm

## 2022-02-04 LAB — AMYLASE, PLEURAL OR PERITONEAL FLUID: Amylase, Fluid: 25 U/L

## 2022-02-04 LAB — LACTATE DEHYDROGENASE: LDH: 226 U/L — ABNORMAL HIGH (ref 98–192)

## 2022-02-04 LAB — LACTATE DEHYDROGENASE, PLEURAL OR PERITONEAL FLUID: LD, Fluid: 221 U/L — ABNORMAL HIGH (ref 3–23)

## 2022-02-04 LAB — ALBUMIN: Albumin: 2.9 g/dL — ABNORMAL LOW (ref 3.5–5.0)

## 2022-02-04 LAB — PROTEIN, PLEURAL OR PERITONEAL FLUID: Total protein, fluid: 4 g/dL

## 2022-02-04 LAB — RESP PANEL BY RT-PCR (FLU A&B, COVID) ARPGX2
Influenza A by PCR: NEGATIVE
Influenza B by PCR: NEGATIVE
SARS Coronavirus 2 by RT PCR: NEGATIVE

## 2022-02-04 LAB — BRAIN NATRIURETIC PEPTIDE: B Natriuretic Peptide: 130.6 pg/mL — ABNORMAL HIGH (ref 0.0–100.0)

## 2022-02-04 LAB — TROPONIN I (HIGH SENSITIVITY)
Troponin I (High Sensitivity): 6 ng/L (ref <18)
Troponin I (High Sensitivity): 6 ng/L (ref <18)

## 2022-02-04 LAB — PROTEIN, TOTAL: Total Protein: 4 g/dL — ABNORMAL LOW (ref 6.5–8.1)

## 2022-02-04 MED ORDER — ONDANSETRON HCL 4 MG PO TABS: 4.0000 mg | ORAL_TABLET | Freq: Four times a day (QID) | ORAL | Status: DC | PRN

## 2022-02-04 MED ORDER — FLUCONAZOLE 100 MG PO TABS: 100.0000 mg | ORAL_TABLET | Freq: Every day | ORAL | 1 refills | Status: AC

## 2022-02-04 MED ORDER — VITAMIN B-12 100 MCG PO TABS
2500.0000 ug | ORAL_TABLET | Freq: Every day | ORAL | Status: DC
  Filled 2022-02-04: qty 25

## 2022-02-04 MED ORDER — FOLIC ACID 1 MG PO TABS
500.0000 ug | ORAL_TABLET | Freq: Every day | ORAL | Status: DC
  Administered 2022-02-05 – 2022-02-09 (×5): 0.5 mg via ORAL
  Filled 2022-02-04 (×5): qty 1

## 2022-02-04 MED ORDER — LACTATED RINGERS IV SOLN: INTRAVENOUS | Status: AC

## 2022-02-04 MED ORDER — SODIUM CHLORIDE 0.9 % IV SOLN
500.0000 mg | INTRAVENOUS | Status: DC
  Administered 2022-02-05 – 2022-02-07 (×3): 500 mg via INTRAVENOUS
  Filled 2022-02-04 (×4): qty 5

## 2022-02-04 MED ORDER — IPRATROPIUM-ALBUTEROL 0.5-2.5 (3) MG/3ML IN SOLN
3.0000 mL | Freq: Four times a day (QID) | RESPIRATORY_TRACT | Status: DC
  Administered 2022-02-05 (×4): 3 mL via RESPIRATORY_TRACT
  Filled 2022-02-04 (×5): qty 3

## 2022-02-04 MED ORDER — VANCOMYCIN HCL IN DEXTROSE 1-5 GM/200ML-% IV SOLN
1000.0000 mg | Freq: Once | INTRAVENOUS | Status: DC
  Filled 2022-02-04: qty 200

## 2022-02-04 MED ORDER — SODIUM CHLORIDE 0.9 % IV SOLN
2.0000 g | Freq: Once | INTRAVENOUS | Status: AC
  Administered 2022-02-04: 2 g via INTRAVENOUS
  Filled 2022-02-04: qty 12.5

## 2022-02-04 MED ORDER — POLYETHYLENE GLYCOL 3350 17 G PO PACK
17.0000 g | PACK | Freq: Every day | ORAL | Status: DC | PRN
  Administered 2022-02-06 – 2022-02-09 (×3): 17 g via ORAL
  Filled 2022-02-04 (×3): qty 1

## 2022-02-04 MED ORDER — SUCRALFATE 1 GM/10ML PO SUSP
1.0000 g | Freq: Every day | ORAL | Status: DC
  Administered 2022-02-05 – 2022-02-09 (×5): 1 g via ORAL
  Filled 2022-02-04 (×5): qty 10

## 2022-02-04 MED ORDER — UMECLIDINIUM BROMIDE 62.5 MCG/ACT IN AEPB
1.0000 | INHALATION_SPRAY | Freq: Every day | RESPIRATORY_TRACT | Status: DC
  Filled 2022-02-04: qty 7

## 2022-02-04 MED ORDER — IOHEXOL 350 MG/ML SOLN
75.0000 mL | Freq: Once | INTRAVENOUS | Status: AC | PRN
  Administered 2022-02-04: 75 mL via INTRAVENOUS

## 2022-02-04 MED ORDER — SODIUM CHLORIDE 0.9 % IV SOLN: 3.0000 g | Freq: Four times a day (QID) | INTRAVENOUS | Status: DC

## 2022-02-04 MED ORDER — PIPERACILLIN-TAZOBACTAM 3.375 G IVPB
3.3750 g | Freq: Three times a day (TID) | INTRAVENOUS | Status: DC
  Administered 2022-02-05 – 2022-02-07 (×8): 3.375 g via INTRAVENOUS
  Filled 2022-02-04 (×9): qty 50

## 2022-02-04 MED ORDER — HYDROCODONE-ACETAMINOPHEN 5-325 MG PO TABS
1.0000 | ORAL_TABLET | Freq: Once | ORAL | Status: AC
  Administered 2022-02-04: 1 via ORAL
  Filled 2022-02-04: qty 1

## 2022-02-04 MED ORDER — SENNA 8.6 MG PO TABS
1.0000 | ORAL_TABLET | Freq: Every day | ORAL | Status: DC | PRN
  Administered 2022-02-09: 8.6 mg via ORAL
  Filled 2022-02-04: qty 1

## 2022-02-04 MED ORDER — ACETAMINOPHEN 500 MG PO TABS
500.0000 mg | ORAL_TABLET | Freq: Two times a day (BID) | ORAL | Status: DC
  Administered 2022-02-05 – 2022-02-09 (×10): 500 mg via ORAL
  Filled 2022-02-04 (×10): qty 1

## 2022-02-04 MED ORDER — SODIUM CHLORIDE 3 % IN NEBU
2.0000 mL | INHALATION_SOLUTION | Freq: Two times a day (BID) | RESPIRATORY_TRACT | Status: DC
  Administered 2022-02-05 – 2022-02-07 (×5): 2 mL via RESPIRATORY_TRACT
  Filled 2022-02-04 (×6): qty 4

## 2022-02-04 MED ORDER — CYCLOBENZAPRINE HCL 10 MG PO TABS
10.0000 mg | ORAL_TABLET | Freq: Every day | ORAL | Status: DC
  Administered 2022-02-05 – 2022-02-08 (×5): 10 mg via ORAL
  Filled 2022-02-04 (×5): qty 1

## 2022-02-04 MED ORDER — ONDANSETRON HCL 4 MG/2ML IJ SOLN
4.0000 mg | Freq: Four times a day (QID) | INTRAMUSCULAR | Status: DC | PRN
  Administered 2022-02-05: 4 mg via INTRAVENOUS
  Filled 2022-02-04: qty 2

## 2022-02-04 MED ORDER — VANCOMYCIN HCL 1500 MG/300ML IV SOLN
1500.0000 mg | Freq: Once | INTRAVENOUS | Status: AC
  Administered 2022-02-04: 1500 mg via INTRAVENOUS
  Filled 2022-02-04: qty 300

## 2022-02-04 MED ORDER — UBROGEPANT 100 MG PO TABS: 100.0000 mg | ORAL_TABLET | Freq: Every day | ORAL | Status: DC | PRN

## 2022-02-04 MED ORDER — PANTOPRAZOLE SODIUM 40 MG PO TBEC
40.0000 mg | DELAYED_RELEASE_TABLET | Freq: Two times a day (BID) | ORAL | Status: DC
  Administered 2022-02-05 – 2022-02-09 (×10): 40 mg via ORAL
  Filled 2022-02-04 (×10): qty 1

## 2022-02-04 MED ORDER — SODIUM CHLORIDE 0.9 % IV SOLN: INTRAVENOUS | Status: AC

## 2022-02-04 MED ORDER — POLYVINYL ALCOHOL 1.4 % OP SOLN: 1.0000 [drp] | Freq: Three times a day (TID) | OPHTHALMIC | Status: DC | PRN

## 2022-02-04 MED ORDER — IPRATROPIUM-ALBUTEROL 0.5-2.5 (3) MG/3ML IN SOLN: 3.0000 mL | Freq: Four times a day (QID) | RESPIRATORY_TRACT | Status: DC | PRN

## 2022-02-04 MED ORDER — EZETIMIBE 10 MG PO TABS
5.0000 mg | ORAL_TABLET | Freq: Every day | ORAL | Status: DC
  Administered 2022-02-05 – 2022-02-09 (×5): 5 mg via ORAL
  Filled 2022-02-04 (×5): qty 0.5

## 2022-02-04 MED ORDER — ARFORMOTEROL TARTRATE 15 MCG/2ML IN NEBU
15.0000 ug | INHALATION_SOLUTION | Freq: Two times a day (BID) | RESPIRATORY_TRACT | Status: DC
  Filled 2022-02-04: qty 2

## 2022-02-04 MED ORDER — ENOXAPARIN SODIUM 40 MG/0.4ML IJ SOSY
40.0000 mg | PREFILLED_SYRINGE | INTRAMUSCULAR | Status: DC
  Administered 2022-02-05 – 2022-02-08 (×4): 40 mg via SUBCUTANEOUS
  Filled 2022-02-04 (×5): qty 0.4

## 2022-02-04 NOTE — Progress Notes (Signed)
Pharmacy Antibiotic Note  Jennifer Hodges is a 76 y.o. female admitted on 02/04/2022 with  pneumonia .  Pharmacy has been consulted for Zosyn dosing.   Plan: Zosyn 3.375g IV q8h  Height: 5\' 1"  (154.9 cm) Weight: 79.2 kg (174 lb 9.7 oz) IBW/kg (Calculated) : 47.8  Temp (24hrs), Avg:98.2 F (36.8 C), Min:98 F (36.7 C), Max:98.4 F (36.9 C)  Recent Labs  Lab 02/04/22 1028  WBC 4.9  CREATININE 0.57     Estimated Creatinine Clearance: 56.2 mL/min (by C-G formula based on SCr of 0.57 mg/dL).    Allergies  Allergen Reactions   Aspirin Hives, Shortness Of Breath and Other (See Comments)    Difficulty breathing   Celebrex [Celecoxib] Shortness Of Breath   Morphine And Related Nausea And Vomiting and Swelling   Adhesive [Tape] Other (See Comments)    Took top layer of skin off when removed.   Clarithromycin Nausea And Vomiting   Codeine Nausea And Vomiting   Darvon [Propoxyphene Hcl] Nausea And Vomiting   Demerol [Meperidine] Nausea And Vomiting   Flonase [Fluticasone Propionate] Other (See Comments)    Fungal infection in nose   Simvastatin Other (See Comments)    "caused ulcers in mouth, and fever"   Talwin [Pentazocine] Nausea And Vomiting    Antimicrobials this admission: Cefepime 11/24 x 1   Zosyn 11/24 >>   Dose adjustments this admission:  Microbiology results:   Thank you for allowing pharmacy to be a part of this patient's care.  Paulina Fusi, PharmD, BCPS 02/04/2022 7:29 PM

## 2022-02-04 NOTE — Assessment & Plan Note (Addendum)
Controlled for now

## 2022-02-04 NOTE — Assessment & Plan Note (Addendum)
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 Has completed radiation therapy Follow-up with oncology Dr. Janese Banks as an outpatient

## 2022-02-04 NOTE — Assessment & Plan Note (Addendum)
Patient presents to the ER for evaluation of right-sided pleuritic chest pain, cough productive of blood-tinged phlegm, worsening shortness of breath associated with fever and chills. 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  Hold meds with liquid as able Seen by pulmonary s/p thoracentesis yesterday with pending fluid studies.  Repeat CT chest today Antibiotic changed to Zosyn and Zithromax  Negative blood cultures till now

## 2022-02-04 NOTE — ED Provider Notes (Signed)
[facility] Provider Note    Event Date/Time   First MD Initiated Contact with Patient 02/04/22 1325     (approximate)   History   Shortness of Breath   HPI  Jennifer Hodges is a 76 y.o. female who on review of oncology notes has a history of stage III adenocarcinoma of the lung, vocal cord paralysis and chemoradiation with maintenance and 4 cycles of chemotherapy in May   For about the last 3 or so months patient has been experiencing some shortness of breath, but over the last roughly 2 weeks started developing increased feeling of shortness of breath.  She is talk to her doctor about it, they advised her to be evaluated the ER.  She reports that she is okay at rest but when she gets up and moves about or does activity she will get short of breath.  No wheezing.  Some cough but no fevers or chills.  No productive cough.  No leg swelling except for which she describes as fairly chronic "lymphedema" that has been "medication related with her chemotherapy  Over the last 3 days though she has noticed she has had a more of a sharp component of pain in her right chest.  She takes prescription pain medicine at home.  When she takes a deep breath it is a catching pain in her right chest.  Denies history of blood clots.  She has not had a per se productive cough but at times she will cough up small amounts but like like tiny little drops of blood in it.  Physical Exam   Triage Vital Signs: ED Triage Vitals [02/04/22 1026]  Enc Vitals Group     BP (!) 141/69     Pulse Rate 90     Resp 18     Temp 98.4 F (36.9 C)     Temp Source Oral     SpO2 93 %     Weight 174 lb 9.7 oz (79.2 kg)     Height 5\' 1"  (1.549 m)     Head Circumference      Peak Flow      Pain Score 8     Pain Loc      Pain Edu?      Excl. in [place]?     Most recent vital signs: Vitals:   02/04/22 1026 02/04/22 1450  BP: (!) 141/69 (!) 140/70  Pulse: 90 88  Resp: 18 18  Temp: 98.4 F (36.9 C)  98 F (36.7 C)  SpO2: 93% 94%     General: Awake, no distress.  Resting comfortably.  Oxygen saturation 95 to 96% on spotcheck here.  Normal work of breathing. CV:  Good peripheral perfusion.  Normal tones and rate Resp:  Normal effort.  Lung sounds are clear over the left chest, and slightly diminished over the right mid (with rhonci) and lower lung fields but no crackles or wheezes are to noted.  She does not use accessory muscle use.  If she speaks in a long phrase that she has to stop briefly to sort of catch her breath which she describes it. Abd:  No distention.  Soft nontender Other:  No appreciable lower extremity edema   ED Results / Procedures / Treatments   Labs (all labs ordered are listed, but only abnormal results are displayed) Labs Reviewed  CBC - Abnormal; Notable for the following components:      Result Value   RBC 3.35 (*)  Hemoglobin 10.2 (*)    HCT 31.9 (*)    All other components within normal limits  COMPREHENSIVE METABOLIC PANEL - Abnormal; Notable for the following components:   Glucose, Bld 106 (*)    All other components within normal limits  BRAIN NATRIURETIC PEPTIDE - Abnormal; Notable for the following components:   B Natriuretic Peptide 130.6 (*)    All other components within normal limits  RESP PANEL BY RT-PCR (FLU A&B, COVID) ARPGX2  CULTURE, BLOOD (SINGLE)  CULTURE, BLOOD (ROUTINE X 2)  CULTURE, BLOOD (ROUTINE X 2)  PROCALCITONIN  TROPONIN I (HIGH SENSITIVITY)  TROPONIN I (HIGH SENSITIVITY)     EKG  EKG interpreted at 1031 heart rate 85 QRS 70 QTc 440 Normal sinus rhythm, no evidence of acute ischemia.  Some baseline artifact.   RADIOLOGY  DG Chest 2 View  Result Date: 02/04/2022 CLINICAL DATA:  Shortness of breath. EXAM: CHEST - 2 VIEW COMPARISON:  CT examination dated November 11, 2021 FINDINGS: The heart size and mediastinal contours are within normal limits. Scarring with bronchiectasis in the right lower lobe with volume loss  and elevation of the right hemidiaphragm, unchanged. Small right pleural effusion. There also mild atelectasis/fibrotic changes at the left lung base. Right IJ access MediPort with distal tip in the SVC. Anterior cervical discectomy and fusion hardware. IMPRESSION: 1. Scarring with bronchiectasis in the right lower lobe with volume loss and elevation of the right hemidiaphragm, unchanged. This may represent postsurgical changes/treatment response. 2. Small right pleural effusion, underlying infectious/inflammatory process can not be excluded. Electronically Signed   By: Keane Police D.O.   On: 02/04/2022 10:47    CT PE protocol IMPRESSION: 1. Negative for pulmonary embolism. 2. Signs of RIGHT middle lobe consolidation that has developed since previous imaging where there was mainly ground-glass about the central RIGHT chest. Findings also associated with increasing pleural fluid with loculated appearance in the LEFT chest but without signs of pleural enhancement at this time. Correlate with any symptoms of pneumonia. Findings could also reflect pneumonitis and should be correlated with any ongoing therapy. 3. Slit like appearance of mainstem bronchi raising the question of bronchomalacia. 4. Material in RIGHT lower lobe bronchi may represent aspirated or inspissated secretions. Correlate with any risk factors for or history of aspiration. There is no large volume material in the trachea or LEFT mainstem bronchus. 5. Stable appearance of post treatment changes about the LEFT hilum.   Chest x-ray interpreted by me as scarring and what appears to be chronic findings over the right chest and also possible small pleural effusion over the right lower chest wall  PROCEDURES:  Critical Care performed: No  Procedures   MEDICATIONS ORDERED IN ED: Medications  lactated ringers infusion (has no administration in time range)  vancomycin (VANCOCIN) IVPB 1000 mg/200 mL premix (has no administration  in time range)  ceFEPIme (MAXIPIME) 2 g in sodium chloride 0.9 % 100 mL IVPB (has no administration in time range)  HYDROcodone-acetaminophen (NORCO/VICODIN) 5-325 MG per tablet 1 tablet (1 tablet Oral Given 02/04/22 1356)  iohexol (OMNIPAQUE) 350 MG/ML injection 75 mL (75 mLs Intravenous Contrast Given 02/04/22 1455)     IMPRESSION / MDM / ASSESSMENT AND PLAN / ED COURSE  I reviewed the triage vital signs and the nursing notes.                              Differential diagnosis includes, but is not limited to,  possible infection such as pneumonia, viral infection, pleurisy, pleural effusion, pulmonary embolism and far less likely causes such as ACS.  Her symptoms seem atypical of ACS, dissection or anything that would suggest a referred pain from an intra-abdominal nature.  Plan to obtain CT PE to further evaluate exclude PE and further workup for pleuritic right-sided chest pain.  Labs reveal normal CBC with exception to hemoglobin with slight anemia that appears to be chronic.  Normal comprehensive metabolic panel.  Her initial troponin is normal  Patient's presentation is most consistent with acute presentation with potential threat to life or bodily function.  ----------------------------------------- 4:39 PM on 02/04/2022 ----------------------------------------- Concerned about the potential the patient has a fairly significant pattern concerning for pneumonia with pleuritic discomfort and also associated effusion which may be representative of parapneumonic effusion or other etiology.  Given her structural disease, report of dyspnea and pleuritic pain I think would be best to admit her for treatment with broad antibiotics.    Consulted with the hospitalist, Dr. Francine Graven, will provide admission.  Also anticipate the patient will receive an oncology consultation.  Patient and her husband both understanding agreeable with plan for admission  FINAL CLINICAL IMPRESSION(S) / ED DIAGNOSES    Final diagnoses:  Thrush of mouth and esophagus ([place])  Community acquired pneumonia, unspecified laterality     Rx / DC Orders   ED Discharge Orders          Ordered    fluconazole (DIFLUCAN) 100 MG tablet  Daily        02/04/22 1337             Note:  This document was prepared using Dragon voice recognition software and may include unintentional dictation errors.   Delman Kitten, MD 02/04/22 [phone]

## 2022-02-04 NOTE — Assessment & Plan Note (Addendum)
Continue CPAP at bedtime.  Patient refused it last night

## 2022-02-04 NOTE — Progress Notes (Signed)
PHARMACY -  BRIEF ANTIBIOTIC NOTE   Pharmacy has received consult(s) for vancomycin and cefepime from an ED provider.  The patient's profile has been reviewed for ht/wt/allergies/indication/available labs.    One time order(s) placed for Vancomycin 1500mg  IV and cefepime 2gm IV  Further antibiotics/pharmacy consults should be ordered by admitting physician if indicated.                       Thank you, Alison Murray 02/04/2022  5:01 PM

## 2022-02-04 NOTE — ED Triage Notes (Signed)
Pt here with SOB that started a month ago but worse over the past week. Pt has pain in her right lung when she takes a deep breath especially when she coughs. Pt states she is also coughing up phlegm that has a little tinge of blood in it.

## 2022-02-04 NOTE — Evaluation (Addendum)
Clinical/Bedside Swallow Evaluation Patient Details  Name: Jennifer Hodges MRN: [id number] Date of Birth: January 23, 1946  Today's Date: 02/04/2022 Time: SLP Start Time (ACUTE ONLY): 2831 SLP Stop Time (ACUTE ONLY): 1915 SLP Time Calculation (min) (ACUTE ONLY): 30 min  Past Medical History:  Past Medical History:  Diagnosis Date   Anemia    Asthma    No Inhalers--Dr. [name] will order as needed   Bronchiectasis ([place])    mild   Chronic headaches     followed by [facility] migraines   COPD (chronic obstructive pulmonary disease) (HCC)    DDD (degenerative disc disease), lumbar    Diverticulosis    Family history of adverse reaction to anesthesia    mother and sisters-PONV   Gall stones    history of   GERD (gastroesophageal reflux disease)    EGD 8/09- non bleeding erosive gastritis, documentd esophageal ulcerations.    Hiatal hernia    small   History of kidney stones    History of pneumonia    Hypercholesterolemia    IBS (irritable bowel syndrome)    Malignant neoplasm of upper lobe of left lung ([place]) 12/27/2018   Meniere disease    Murmur    Osteoarthritis    lumbar disc disease, left hip   Personal history of chemotherapy    Personal history of radiation therapy    Pneumonia 12/2020   PONV (postoperative nausea and vomiting)    Sleep apnea    uses cpap   Vertigo    Weakness of right side of body    Past Surgical History:  Past Surgical History:  Procedure Laterality Date   ABDOMINAL HYSTERECTOMY  age 17   ANTERIOR CERVICAL DECOMP/DISCECTOMY FUSION N/A 02/24/2015   Procedure: CERVICAL FOUR-FIVE, CERVICAL FIVE-SIX, CERVICAL SIX-SEVEN ANTERIOR CERVICAL DECOMPRESSION/DISCECTOMY FUSION ;  Surgeon: [name], MD;  Location: [place] [facility];  Service: Neurosurgery;  Laterality: N/A;  C45 C56 C67 anterior cervical decompression with fusion interbody prosthesis plating and bonegraft   APPENDECTOMY     BACK SURGERY  1988   4th lumbar fusion   BLADDER SURGERY N/A     with vaginal wall repair   BREAST CYST ASPIRATION Bilateral    neg   BREAST SURGERY Bilateral    cyst removed and reduction   CARDIAC CATHETERIZATION  2014   CARPAL TUNNEL RELEASE Right 02/11/2016   Procedure: CARPAL TUNNEL RELEASE;  Surgeon: [name], MD;  Location: [facility];  Service: Orthopedics;  Laterality: Right;   CATARACT EXTRACTION W/ INTRAOCULAR LENS IMPLANT Bilateral 2015   CHOLECYSTECTOMY     EUS N/A 05/31/2012   Procedure: UPPER ENDOSCOPIC ULTRASOUND (EUS) LINEAR;  Surgeon: [name], MD;  Location: [facility];  Service: Endoscopy;  Laterality: N/A;   EXCISIONAL HEMORRHOIDECTOMY     JOINT REPLACEMENT Bilateral    KNEE ARTHROSCOPY WITH LATERAL MENISECTOMY Right 07/07/2015   Procedure: KNEE ARTHROSCOPY WITH LATERAL MENISECTOMY, PARTIAL SYNOVECTOMY;  Surgeon: [name], MD;  Location: [facility];  Service: Orthopedics;  Laterality: Right;   LUMBAR LAMINECTOMY     PORTA CATH INSERTION N/A 01/07/2019   Procedure: PORTA CATH INSERTION;  Surgeon: [name], MD;  Location: [place] [facility];  Service: Cardiovascular;  Laterality: N/A;   REDUCTION MAMMAPLASTY  1990   RIGHT OOPHORECTOMY     TOTAL HIP ARTHROPLASTY Left 05/01/2014   Dr. [name]   TOTAL HIP ARTHROPLASTY Right 08/05/2014   Procedure: TOTAL HIP ARTHROPLASTY ANTERIOR APPROACH;  Surgeon: [name], MD;  Location: [facility];  Service: Orthopedics;  Laterality: Right;   ULNAR NERVE TRANSPOSITION Right 02/11/2016   Procedure: ULNAR NERVE DECOMPRESSION/TRANSPOSITION;  Surgeon: [name], MD;  Location: [facility];  Service: Orthopedics;  Laterality: Right;   VIDEO BRONCHOSCOPY WITH ENDOBRONCHIAL ULTRASOUND Left 12/19/2018   Procedure: VIDEO BRONCHOSCOPY WITH ENDOBRONCHIAL ULTRASOUND, LEFT, SLEEP APNEA;  Surgeon: [name], MD;  Location: [facility];  Service: Thoracic;  Laterality: Left;   VIDEO BRONCHOSCOPY WITH ENDOBRONCHIAL ULTRASOUND Right 05/14/2021   Procedure: VIDEO BRONCHOSCOPY WITH  ENDOBRONCHIAL ULTRASOUND;  Surgeon: Tyler Pita, MD;  Location: [facility];  Service: Pulmonary;  Laterality: Right;   HPI:  Jennifer Hodges is a 76 y.o. 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 She states that she has had symptoms that have progressively worsened prompting her visit to the emergency room. She also has difficulty swallowing but with liquids and solids.  She also complains of constipation She denies having any nausea, no vomiting, no abdominal pain, no urinary symptoms, no dizziness, no lightheadedness, no headache, no blurred vision, no focal deficit. Thoracentesis completed on 02/04/22. CT Chest 02/04/22: Negative for pulmonary embolism. Signs of RIGHT middle lobe consolidation that has developed since  previous imaging where there was mainly ground-glass about the central RIGHT chest. Findings also associated with increasing  pleural fluid with loculated appearance in the LEFT chest but without signs of pleural enhancement at this time. Correlate with any symptoms of pneumonia. Findings could also reflect pneumonitis and should be correlated with any ongoing therapy. Slit like appearance of mainstem bronchi raising the question of bronchomalacia. Material in RIGHT lower lobe bronchi may represent aspirated or inspissated secretions. Correlate with any risk factors for or history of aspiration. There is no large volume material in the trachea or LEFT mainstem  bronchus. Stable appearance of post treatment changes about the LEFT hilum. Pt previously seen by SLP services for voice intervention related to left sided vocal fold paralysis in 2021. No history or reported dysphagia intervention.    Assessment / Plan / Recommendation  Clinical Impression  Clinical swallow assessment completed this date in the setting of concern for aspiration PNA. Pt alert, sitting upright on room air consuming dinner meal upon therapist approach. Pt on room air with O2 saturations ranging from 94-96 for duration of session. Vocal quality mildly breathy, hoarse, though pt reported that voicing is close to baseline and likely residual from known vocal fold dysfunction. Oral motor ability grossly intact. Pt seen with trials of thin liquids (cup and spoon) and regular solids (all from dinner tray). No overt or subtle s/sx pharyngeal dysphagia noted. No change to vocal quality across trials. Vitals stable for duration of trials. Oral phase grossly functional with efficient oral manipulation and oral clearance. Pt endorses esophageal dysphagia symptoms including indigestion.  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 Recommend  aspiration and esophageal precautions (slow rate, small bites, elevated HOB during and at least one hour following intake, and alert for intake). Monitor for shortness of breath during intake to determine need for rest.       Given results of assessment, recommend soft solids for energy conservation with thin liquids. SLP will follow up to determine safety with recommended diet, compliance with aspiration precautions, and candidacy for diet advancement. Recommend GI follow up. MD and NSG aware of recommendations. SLP Visit Diagnosis: Dysphagia,  unspecified (R13.10)    Aspiration Risk  Moderate aspiration risk    Diet Recommendation   Dys 3 Mech Soft and Thin Liquids   Medication Administration: Whole meds with liquid (as pt is able)    Other  Recommendations Recommended Consults: Consider GI evaluation Oral Care Recommendations: Oral care BID    Recommendations for follow up therapy are one component of a multi-disciplinary discharge planning process, led by the attending physician.  Recommendations may be updated based on patient status, additional functional criteria and insurance authorization.  Follow up Recommendations  (TBD)      Assistance Recommended at Discharge  Supervision   Functional Status Assessment Patient has had a recent decline in their functional status and demonstrates the ability to make significant improvements in function in a reasonable and predictable amount of time.  Frequency and Duration min 2x/week  1 week       Prognosis Prognosis for Safe Diet Advancement: Fair Barriers to Reach Goals: Time post onset      Swallow Study   General Date of Onset: 02/04/22 HPI: Jennifer Hodges is a 76 y.o. 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 She states that she has had symptoms that have progressively worsened prompting her visit to the emergency room. She also has difficulty swallowing but with liquids and solids.  She also complains of constipation She denies having any nausea, no  vomiting, no abdominal pain, no urinary symptoms, no dizziness, no lightheadedness, no headache, no blurred vision, no focal deficit. Thoracentesis completed on 02/04/22. CT Chest 02/04/22: Negative for pulmonary embolism. Signs of RIGHT middle lobe consolidation that has developed since  previous imaging where there was mainly ground-glass about the central RIGHT chest. Findings also associated with increasing  pleural fluid with loculated appearance in the LEFT chest but without signs of pleural enhancement at this time. Correlate with any symptoms of pneumonia. Findings could also reflect pneumonitis and should be correlated with any ongoing therapy. Slit like appearance of mainstem bronchi raising the question of bronchomalacia. Material in RIGHT lower lobe bronchi may represent aspirated or inspissated secretions. Correlate with any risk factors for or history of aspiration. There is no large volume material in the trachea or LEFT mainstem bronchus. Stable appearance of post treatment changes about the LEFT hilum. Pt previously seen by SLP services for voice intervention related to left sided vocal fold paralysis in 2021. No history or reported dysphagia intervention. Type of Study: Bedside Swallow Evaluation Previous Swallow Assessment: no hx of swallow  intervention or assessment Diet Prior to this Study: Dysphagia 3 (soft);Thin liquids Temperature Spikes Noted: No Respiratory Status: Room air History of Recent Intubation: No Behavior/Cognition: Alert;Cooperative;Pleasant mood Oral Cavity Assessment: Within Functional Limits Oral Care Completed by SLP: Recent completion by staff Oral Cavity - Dentition: Adequate natural dentition Patient Positioning: Upright in bed Baseline Vocal Quality: Hoarse;Breathy;Other (comment) (close to baseline) Volitional Cough: Congested Volitional Swallow: Unable to elicit    Oral/Motor/Sensory Function Overall Oral Motor/Sensory Function: Within functional limits    Ice Chips Ice chips: Not tested   Thin Liquid Thin Liquid: Within functional limits    Nectar Thick Nectar Thick Liquid: Not tested   Honey Thick Honey Thick Liquid: Not tested   Puree Puree: Not tested   Solid    [place] Raevin Wierenga Clapp MS [name] SLP  Solid: Within functional limits      [place] J Clapp 02/04/2022,7:22 PM

## 2022-02-04 NOTE — Consult Note (Addendum)
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  Patient reports that she has been having shortness of breath for the past couple of months that was initially with exertion and slowly progressed to shortness of breath with rest.  Over the past 7 days, she has had further progression in her symptoms whereby she has been extremely winded at rest.  This was associated with a cough that is productive of sputum and report of chills at home.  She did not measure her temperature but thinks she might of had a fever.  The progression of her symptoms prompted her to present to the ED for an evaluation.  She does not have any chest pain or chest tightness, and denies any hemoptysis.  She does not have any GI or GU symptoms.  She has not had any weakness.  She reports subjective fevers and chills but has not measured her temperature.  In the ED, white count was within normal as were her hemodynamics.  She is afebrile in the ED.  Blood cultures were drawn and sent and she was given broad-spectrum antibiotics.  CT scan of the chest with PE protocol was performed and was negative for pulmonary embolism.  Right middle lobe consolidation was noted to be new as compared to prior imaging and there was an associated RIGHT sided pleural effusion, with possible loculation anteriorly. The CT was also notable for material in the right lower lobe bronchi concerning for aspirated/inspissated secretions.  Pulmonology was consulted to aid in the management of the patient's symptoms.  Pertinent  Medical History  Her past  medical history is most notable for stage III adenocarcinoma of the lung diagnosed in September 2020 for which she is followed by Dr. Janese Banks.  At that time, the tumor was expressing PD-L1 at 50%.  She underwent chemoradiation in December 2020 and initiated on maintenance durvalumab in January 2021.  She was found to have enlarging mediastinal adenopathy in January 2023 biopsied and noted for adenocarcinoma of the lung as well.  She underwent 4 cycles of chemoradiation ending in May 2023 and continues to follow closely with oncology.  She was last seen in oncology clinic in November 2023 and continued on maintenance pemetrexed.  She is also followed by [facility] pulmonary (Dr. [name]) for postradiation pneumonitis and recurrent episodes of bronchitis.  Significant Hospital Events: Including procedures, antibiotic start and stop dates in addition to other pertinent events   02/04/2022: admitted, thoracentesis performed   Objective   Blood pressure (!) 140/70, pulse 88, temperature 98 F (36.7 C), temperature source Oral, resp. rate 18, height _0  (1.549 m), weight 79.2 kg, SpO2 94 %.       No intake or output data in the 24 hours ending 02/04/22 1752 Filed Weights   02/04/22 1026  Weight: 79.2 kg    Examination: Physical Exam Constitutional:      Hodges: She is not in acute distress.    Appearance: She is obese. She is not ill-appearing.  HENT:     Mouth/Throat:     Mouth: Mucous membranes are moist.  Eyes:  Extraocular Movements: Extraocular movements intact.     Pupils: Pupils are equal, round, and reactive to light.  Cardiovascular:     Rate and Rhythm: Normal rate and regular rhythm.     Pulses: Normal pulses.     Heart sounds: Normal heart sounds.  Pulmonary:     Effort: Respiratory distress present.     Breath sounds: Rhonchi (diffuse rhonchi, more so on the right.) present. No wheezing.     Comments: Decreased breath sounds on the right Abdominal:     Hodges: There is  distension.     Palpations: Abdomen is soft.  Musculoskeletal:        Hodges: Normal range of motion.     Cervical back: Normal range of motion and neck supple.  Neurological:     Hodges: No focal deficit present.     Mental Status: She is alert and oriented to person, place, and time.     Assessment & Plan:   Jennifer Hodges is a 76 year old female with a history of stage III lung adenocarcinoma presenting to the hospital with shortness of breath concerning for pneumonia with CT imaging notable for a right-sided pleural effusion with signs of loculation.  # Acute hypoxic respiratory failure # Stage III non-small cell lung cancer # Right-sided pleural effusion # Right middle lobe consolidation # History of radiation pneumonitis  The patient is presenting with acute hypoxic respiratory failure secondary to combination of pneumonia and right-sided pleural effusion on the backdrop of radiation pneumonitis and stage III non-small cell lung cancer.  While procalcitonin was negative, this does not preclude her from having a pneumonia given she is immunosuppressed with chemotherapy and has structural lung disease secondary to the cancer and radiation.  I agree with continuation of broad-spectrum antibiotics and would recommend obtaining respiratory cultures in addition to Legionella and strep antigens in the urine.  Would recommend Legionella coverage with azithromycin in addition to the broad-spectrum antibiotic to cover for pneumonia.  The presentation of the consolidation on the CT scan is not consistent with chemotherapy associated pneumotoxicity given its focality.  Should the patient not improve with a course of antibiotics and pleural drainage, she would be a candidate for flexible bronchoscopy for airway evaluation to rule out an endobronchial lesion, mass, or mucous plug.  The pleural effusion is simple appearing on lung ultrasound with no signs of septation or pleural thickening.  The  differential includes a malignant pleural effusion versus a parapneumonic effusion in the setting of pneumonia.  She will require needle thoracostomy for drainage performed by me at the bedside today.  The fluid was sent for cultures, analysis, cytology, and flow cytometry.  I would recommend a repeat CT scan of the chest following thoracentesis to evaluate for any loculation.  Should there be remaining fluid loculation anteriorly, would recommend a consult to interventional radiology for drainage of said anterior effusion to rule out infection (Patient placed NPO after midnight).  Finally, would recommend aggressive pulmonary toilet with flutter device, incentive spirometry, chest physiotherapy, and hypertonic saline followed by DuoNebs. Would switch to standing duonebs for management of her COPD while inpatient.  Recommendations:  -thoracentesis at the bedside today -repeat Chest CT w/o contrast -legionella coverage, check strep and legionella urine antigen -check MRSA PCR, d/c vanc if negative -duonebs standing, duonebs PRN, hypertonic saline -chest physiotherapy, pulmonary toilet -continue broad spectrum antibiotics -follow up pleural fluid analysis, cytology and culture  Labs   CBC: Recent Labs  Lab 02/04/22 1028  WBC 4.9  HGB  10.2*  HCT 31.9*  MCV 95.2  PLT 009    Basic Metabolic Panel: Recent Labs  Lab 02/04/22 1028  NA 140  K 4.2  CL 104  CO2 27  GLUCOSE 106*  BUN 10  CREATININE 0.57  CALCIUM 9.4   GFR: Estimated Creatinine Clearance: 56.2 mL/min (by C-G formula based on SCr of 0.57 mg/dL). Recent Labs  Lab 02/04/22 1028  PROCALCITON <0.10  WBC 4.9    Liver Function Tests: Recent Labs  Lab 02/04/22 1028  AST 23  ALT 14  ALKPHOS 82  BILITOT 0.6  PROT 6.7  ALBUMIN 3.7   No results for input(s): "LIPASE", "AMYLASE" in the last 168 hours. No results for input(s): "AMMONIA" in the last 168 hours.  ABG No results found for: "PHART", "PCO2ART",  "PO2ART", "HCO3", "TCO2", "ACIDBASEDEF", "O2SAT"   Coagulation Profile: No results for input(s): "INR", "PROTIME" in the last 168 hours.  Cardiac Enzymes: No results for input(s): "CKTOTAL", "CKMB", "CKMBINDEX", "TROPONINI" in the last 168 hours.  HbA1C: Hgb A1c MFr Bld  Date/Time Value Ref Range Status  02/18/2015 01:46 PM 5.7 (H) 4.8 - 5.6 % Final    Comment:    (NOTE)         Pre-diabetes: 5.7 - 6.4         Diabetes: >6.4         Glycemic control for adults with diabetes: <7.0   11/11/2013 11:15 AM 5.7 4.6 - 6.5 % Final    Comment:    Glycemic Control Guidelines for People with Diabetes:Non Diabetic:  <6%Goal of Therapy: <7%Additional Action Suggested:  >8%     CBG: No results for input(s): "GLUCAP" in the last 168 hours.  Review of Systems:   As per HPI  Past Medical History:  She,  has a past medical history of Anemia, Asthma, Bronchiectasis ([place]), Chronic headaches, COPD (chronic obstructive pulmonary disease) ([place]), DDD (degenerative disc disease), lumbar, Diverticulosis, Family history of adverse reaction to anesthesia, Gall stones, GERD (gastroesophageal reflux disease), Hiatal hernia, History of kidney stones, History of pneumonia, Hypercholesterolemia, IBS (irritable bowel syndrome), Malignant neoplasm of upper lobe of left lung ([place]) (12/27/2018), Meniere disease, Murmur, Osteoarthritis, Personal history of chemotherapy, Personal history of radiation therapy, Pneumonia (12/2020), PONV (postoperative nausea and vomiting), Sleep apnea, Vertigo, and Weakness of right side of body.   Surgical History:   Past Surgical History:  Procedure Laterality Date   ABDOMINAL HYSTERECTOMY  age 72   ANTERIOR CERVICAL DECOMP/DISCECTOMY FUSION N/A 02/24/2015   Procedure: CERVICAL FOUR-FIVE, CERVICAL FIVE-SIX, CERVICAL SIX-SEVEN ANTERIOR CERVICAL DECOMPRESSION/DISCECTOMY FUSION ;  Surgeon: [name], MD;  Location: [facility];  Service: Neurosurgery;  Laterality: N/A;  C45 C56  C67 anterior cervical decompression with fusion interbody prosthesis plating and bonegraft   APPENDECTOMY     BACK SURGERY  1988   4th lumbar fusion   BLADDER SURGERY N/A    with vaginal wall repair   BREAST CYST ASPIRATION Bilateral    neg   BREAST SURGERY Bilateral    cyst removed and reduction   CARDIAC CATHETERIZATION  2014   CARPAL TUNNEL RELEASE Right 02/11/2016   Procedure: CARPAL TUNNEL RELEASE;  Surgeon: [name], MD;  Location: [facility] OR[facility];  Service: Orthopedics;  Laterality: Right;   CATARACT EXTRACTION W/ INTRAOCULAR LENS IMPLANT Bilateral 2015   CHOLECYSTECTOMY     EUS N/A 05/31/2012   Procedure: UPPER ENDOSCOPIC ULTRASOUND (EUS) LINEAR;  Surgeon: [name], MD;  Location: [facility];  Service: Endoscopy;  Laterality: N/A;  EXCISIONAL HEMORRHOIDECTOMY     JOINT REPLACEMENT Bilateral    KNEE ARTHROSCOPY WITH LATERAL MENISECTOMY Right 07/07/2015   Procedure: KNEE ARTHROSCOPY WITH LATERAL MENISECTOMY, PARTIAL SYNOVECTOMY;  Surgeon: [name], MD;  Location: [facility];  Service: Orthopedics;  Laterality: Right;   LUMBAR LAMINECTOMY     PORTA CATH INSERTION N/A 01/07/2019   Procedure: PORTA CATH INSERTION;  Surgeon: [name], MD;  Location: [place];  Service: Cardiovascular;  Laterality: N/A;   REDUCTION MAMMAPLASTY  1990   RIGHT OOPHORECTOMY     TOTAL HIP ARTHROPLASTY Left 05/01/2014   Dr. [name]   TOTAL HIP ARTHROPLASTY Right 08/05/2014   Procedure: TOTAL HIP ARTHROPLASTY ANTERIOR APPROACH;  Surgeon: [name], MD;  Location: [facility] OR[facility];  Service: Orthopedics;  Laterality: Right;   ULNAR NERVE TRANSPOSITION Right 02/11/2016   Procedure: ULNAR NERVE DECOMPRESSION/TRANSPOSITION;  Surgeon: [name], MD;  Location: [facility];  Service: Orthopedics;  Laterality: Right;   VIDEO BRONCHOSCOPY WITH ENDOBRONCHIAL ULTRASOUND Left 12/19/2018   Procedure: VIDEO BRONCHOSCOPY WITH ENDOBRONCHIAL ULTRASOUND, LEFT, SLEEP APNEA;  Surgeon: [name], MD;  Location: [facility] ORS;  Service: Thoracic;  Laterality: Left;   VIDEO BRONCHOSCOPY WITH ENDOBRONCHIAL ULTRASOUND Right 05/14/2021   Procedure: VIDEO BRONCHOSCOPY WITH ENDOBRONCHIAL ULTRASOUND;  Surgeon: Tyler Pita, MD;  Location: [facility] OR[facility];  Service: Pulmonary;  Laterality: Right;     Social History:   reports that she quit smoking about 14 years ago. Her smoking use included cigarettes. She has a 40.00 pack-year smoking history. She has never used smokeless tobacco. She reports that she does not drink alcohol and does not use drugs.   Family History:  Her family history includes Breast cancer in her cousin and paternal aunt; Diabetes in her father; Heart disease in her mother; Hypercholesterolemia in her mother; Hypertension in her mother; Stomach cancer in an other family member.   Allergies Allergies  Allergen Reactions   Aspirin Hives, Shortness Of Breath and Other (See Comments)    Difficulty breathing   Celebrex [Celecoxib] Shortness Of Breath   Morphine And Related Nausea And Vomiting and Swelling   Adhesive [Tape] Other (See Comments)    Took top layer of skin off when removed.   Clarithromycin Nausea And Vomiting   Codeine Nausea And Vomiting   Darvon [Propoxyphene Hcl] Nausea And Vomiting   Demerol [Meperidine] Nausea And Vomiting   Flonase [Fluticasone Propionate] Other (See Comments)    Fungal infection in nose   Simvastatin Other (See Comments)    "caused ulcers in mouth, and fever"   Talwin [Pentazocine] Nausea And Vomiting     Home Medications  Prior to Admission medications   Medication Sig Start Date End Date Taking? Authorizing Provider  fluconazole (DIFLUCAN) 100 MG tablet Take 1 tablet (100 mg total) by mouth daily for 7 days. 02/04/22 02/11/22 Yes Delman Kitten, MD  acetaminophen (TYLENOL) 500 MG tablet Take 500 mg by mouth 2 (two) times daily.    [provider]  albuterol (VENTOLIN HFA) 108 (90 Base) MCG/ACT inhaler Inhale 2 puffs into the  lungs every 6 (six) hours as needed for wheezing or shortness of breath.    [provider]  Bioflavonoid Products (ESTER C PO) Take 1 tablet by mouth 3 (three) times daily. 500 mg    [provider]  Calcium-Magnesium-Zinc (CAL-MAG-ZINC PO) Take 1 tablet by mouth daily.    [provider]  carboxymethylcellulose (REFRESH PLUS) 0.5 % SOLN Place 1 drop into both eyes 3 (three) times daily  as needed (dry eyes).    [provider]  cetirizine (ZYRTEC) 5 MG tablet Take 1 tablet (5 mg total) by mouth daily. Patient not taking: Reported on 12/29/2021 04/17/18   Coral Spikes, DO  chlorhexidine (PERIDEX) 0.12 % solution SMARTSIG:By Mouth Patient not taking: Reported on 11/17/2021 09/28/21   [provider]  Cholecalciferol (EQL VITAMIN D3) 25 MCG (1000 UT) capsule Take 1,000 Units by mouth daily.    [provider]  Coenzyme Q10 (CO Q 10) 100 MG CAPS Take 100 mg by mouth daily.     [provider]  Cyanocobalamin (VITAMIN B-12) 2500 MCG SUBL Place 2,500 mcg under the tongue daily.    [provider]  cyclobenzaprine (FLEXERIL) 10 MG tablet Take 10 mg by mouth at bedtime.    [provider]  DHEA 25 MG CAPS Take 25 mg by mouth daily.    [provider]  ezetimibe (ZETIA) 10 MG tablet Take 5 mg by mouth in the morning and at bedtime. 10/26/18   [provider]  folic acid (FOLVITE) 409 MCG tablet Take 400 mcg by mouth daily.    [provider]  gabapentin (NEURONTIN) 100 MG capsule Take 100 mg by mouth 3 (three) times daily. Patient not taking: Reported on 10/06/2021    [provider]  Garlic 8119 MG CAPS Take 1,000 mg by mouth daily. Patient not taking: Reported on 12/29/2021    [provider]  Histamine Dihydrochloride (AUSTRALIAN DREAM ARTHRITIS) 0.025 % CREA Apply 1 application. topically 4 (four) times daily as needed (pain).    [provider]  HYDROcodone-acetaminophen  (NORCO/VICODIN) 5-325 MG tablet 1-2 tabs po bid prn Patient not taking: Reported on 11/17/2021 12/09/18   Norval Gable, MD  levOCARNitine (L-CARNITINE) 250 MG TABS Take 1 tablet by mouth daily. With Chromium 250 mg    [provider]  meclizine (ANTIVERT) 25 MG tablet Take 1 tablet (25 mg total) by mouth every 6 (six) hours as needed for dizziness. Patient not taking: Reported on 11/17/2021 06/11/19   Sindy Guadeloupe, MD  ondansetron (ZOFRAN) 8 MG tablet Take by mouth every 8 (eight) hours as needed for nausea or vomiting.    [provider]  pantoprazole (PROTONIX) 40 MG tablet Take 40 mg by mouth 2 (two) times daily.    [provider]  polyethylene glycol powder (GLYCOLAX/MIRALAX) 17 GM/SCOOP powder Take 17 g by mouth daily as needed for moderate constipation.    [provider]  pyridOXINE (VITAMIN B-6) 100 MG tablet Take 100 mg by mouth daily.    [provider]  Selenium 200 MCG CAPS Take 200 mcg by mouth daily.    [provider]  Sennosides (SENNA) 8.6 MG CAPS Take 1 capsule by mouth daily as needed (constipation).    [provider]  sucralfate (CARAFATE) 1 GM/10ML suspension Take 1 g by mouth daily. 02/13/19   [provider]  Tiotropium Bromide-Olodaterol (STIOLTO RESPIMAT) 2.5-2.5 MCG/ACT AERS Inhale 2 puffs into the lungs daily. 06/07/21   Tyler Pita, MD  tobramycin-dexamethasone [facility]) ophthalmic solution SMARTSIG:1 Drop(s) In Eye(s) 2-3 Times Daily PRN 08/26/21   [provider]  triamcinolone (NASACORT) 55 MCG/ACT AERO nasal inhaler Place 2 sprays into the nose daily.    [provider]  UBRELVY 100 MG TABS Take 100 mg by mouth daily as needed (migraines). 01/30/20   [provider]  Vitamin A 2400 MCG (8000 UT) CAPS Take 8,000 Units by mouth daily.  [provider]  Vitamin E 200 units TABS Take 200 Units by mouth daily.    [provider]  Wheat Dextrin (EQ  FIBER POWDER PO) Take by mouth.    [provider]     Total care time: 4 minutes    Armando Reichert, MD [facility]

## 2022-02-04 NOTE — Procedures (Signed)
Thoracentesis  Procedure Note  [facility]  [id number]  [birth year]  Date:02/04/22  Time:6:40 PM   Provider Performing:Santosh Petter   Procedure: Thoracentesis with imaging guidance (07622)  Indication(s) Pleural Effusion  Consent Risks of the procedure as well as the alternatives and risks of each were explained to the patient and/or caregiver.  Consent for the procedure was obtained and is signed in the bedside chart  Anesthesia Topical only with 1% lidocaine    Time Out Verified patient identification, verified procedure, site/side was marked, verified correct patient position, special equipment/implants available, medications/allergies/relevant history reviewed, required imaging and test results available.   Sterile Technique Maximal sterile technique including full sterile barrier drape, hand hygiene, sterile gown, sterile gloves, mask, hair covering, sterile ultrasound probe cover (if used).  Procedure Description Ultrasound was used to identify appropriate pleural anatomy for placement and overlying skin marked.  Area of drainage cleaned and draped in sterile fashion. Lidocaine was used to anesthetize the skin and subcutaneous tissue.  450 cc's of yellow appearing fluid was drained from the right pleural space. Catheter then removed and bandaid applied to site.   Complications/Tolerance None; patient tolerated the procedure well. Chest X-ray is ordered to confirm no post-procedural complication.   EBL Minimal   Specimen(s) Pleural fluid sent for culture, analysis, cytology and flow cytometry.

## 2022-02-04 NOTE — Progress Notes (Signed)
Pharmacy Antibiotic Note  Jennifer Hodges is a 76 y.o. female admitted on 02/04/2022 with  aspiration pneumonia .  Pharmacy has been consulted for Unasyn dosing.  Plan: Unasyn 3g IV q6h  Height: 5\' 1"  (154.9 cm) Weight: 79.2 kg (174 lb 9.7 oz) IBW/kg (Calculated) : 47.8  Temp (24hrs), Avg:98.2 F (36.8 C), Min:98 F (36.7 C), Max:98.4 F (36.9 C)  Recent Labs  Lab 02/04/22 1028  WBC 4.9  CREATININE 0.57    Estimated Creatinine Clearance: 56.2 mL/min (by C-G formula based on SCr of 0.57 mg/dL).    Allergies  Allergen Reactions   Aspirin Hives, Shortness Of Breath and Other (See Comments)    Difficulty breathing   Celebrex [Celecoxib] Shortness Of Breath   Morphine And Related Nausea And Vomiting and Swelling   Adhesive [Tape] Other (See Comments)    Took top layer of skin off when removed.   Clarithromycin Nausea And Vomiting   Codeine Nausea And Vomiting   Darvon [Propoxyphene Hcl] Nausea And Vomiting   Demerol [Meperidine] Nausea And Vomiting   Flonase [Fluticasone Propionate] Other (See Comments)    Fungal infection in nose   Simvastatin Other (See Comments)    "caused ulcers in mouth, and fever"   Talwin [Pentazocine] Nausea And Vomiting    Antimicrobials this admission: Cefepime 11/24 x 1   Unasyn 11/25 >>   Dose adjustments this admission:  Microbiology results:   Thank you for allowing pharmacy to be a part of this patient's care.  Paulina Fusi, PharmD, BCPS 02/04/2022 5:21 PM

## 2022-02-04 NOTE — H&P (Addendum)
History and Physical    Patient: Jennifer Hodges:[id number] DOB: 09-Apr-1945 DOA: 02/04/2022 DOS: the patient was seen and examined on 02/04/2022 PCP: [name], MD  Patient coming from: Home  Chief Complaint:  Chief Complaint  Patient presents with   Shortness of Breath   HPI: Jennifer Hodges is a 76 y.o. 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 She states that she has had symptoms that have progressively worsened prompting her visit to the emergency room. She also has difficulty swallowing but with liquids and solids.  She also complains of constipation She denies having any nausea, no vomiting, no abdominal pain, no urinary symptoms, no dizziness, no lightheadedness, no headache, no blurred vision, no focal deficit. She had a CT angiogram to rule out a PE which was negative for pulmonary embolism. Signs of RIGHT middle lobe consolidation that has developed since previous imaging where there was mainly ground-glass about the central RIGHT chest. Findings also associated with increasing pleural fluid with loculated appearance in the LEFT chest but without signs of pleural enhancement at this time. Slit like appearance of mainstem bronchi raising the question of bronchomalacia. Material in RIGHT lower lobe bronchi may represent aspirated  or inspissated secretions. Correlate with any risk factors for or history of aspiration. There is no large volume material in  the trachea or LEFT mainstem bronchus. Stable appearance of post treatment changes about the LEFT hilum.  She received a dose of cefepime in the ER and will be admitted to the hospital for further evaluation   Review of Systems: As mentioned in the history of present illness. All other systems reviewed and are negative. Past Medical History:  Diagnosis Date   Anemia    Asthma    No Inhalers--Dr. [name] will order as needed   Bronchiectasis ([place])    mild   Chronic headaches     followed by [facility] migraines   COPD (chronic obstructive pulmonary disease) (HCC)    DDD (degenerative disc disease), lumbar    Diverticulosis    Family history of adverse reaction to anesthesia    mother and sisters-PONV   Gall stones    history of   GERD (gastroesophageal reflux disease)    EGD 8/09- non bleeding erosive gastritis, documentd esophageal ulcerations.    Hiatal hernia    small   History of kidney stones    History of pneumonia    Hypercholesterolemia    IBS (irritable bowel syndrome)    Malignant neoplasm of upper lobe of left lung ([place]) 12/27/2018   Meniere disease    Murmur    Osteoarthritis    lumbar disc disease, left hip   Personal history of chemotherapy    Personal history of radiation therapy    Pneumonia 12/2020   PONV (postoperative nausea and vomiting)    Sleep apnea    uses cpap   Vertigo    Weakness of right side of body    Past Surgical History:  Procedure Laterality Date  ABDOMINAL HYSTERECTOMY  age 30   ANTERIOR CERVICAL DECOMP/DISCECTOMY FUSION N/A 02/24/2015   Procedure: CERVICAL FOUR-FIVE, CERVICAL FIVE-SIX, CERVICAL SIX-SEVEN ANTERIOR CERVICAL DECOMPRESSION/DISCECTOMY FUSION ;  Surgeon: [name], MD;  Location: [place] [facility];  Service: Neurosurgery;  Laterality: N/A;  C45 C56 C67 anterior cervical decompression with fusion interbody prosthesis plating and bonegraft   APPENDECTOMY     BACK SURGERY  1988   4th lumbar fusion   BLADDER SURGERY  N/A    with vaginal wall repair   BREAST CYST ASPIRATION Bilateral    neg   BREAST SURGERY Bilateral    cyst removed and reduction   CARDIAC CATHETERIZATION  2014   CARPAL TUNNEL RELEASE Right 02/11/2016   Procedure: CARPAL TUNNEL RELEASE;  Surgeon: [name], MD;  Location: [facility];  Service: Orthopedics;  Laterality: Right;   CATARACT EXTRACTION W/ INTRAOCULAR LENS IMPLANT Bilateral 2015   CHOLECYSTECTOMY     EUS N/A 05/31/2012   Procedure: UPPER ENDOSCOPIC ULTRASOUND (EUS) LINEAR;  Surgeon: [name], MD;  Location: [facility];  Service: Endoscopy;  Laterality: N/A;   EXCISIONAL HEMORRHOIDECTOMY     JOINT REPLACEMENT Bilateral    KNEE ARTHROSCOPY WITH LATERAL MENISECTOMY Right 07/07/2015   Procedure: KNEE ARTHROSCOPY WITH LATERAL MENISECTOMY, PARTIAL SYNOVECTOMY;  Surgeon: [name], MD;  Location: [facility];  Service: Orthopedics;  Laterality: Right;   LUMBAR LAMINECTOMY     PORTA CATH INSERTION N/A 01/07/2019   Procedure: PORTA CATH INSERTION;  Surgeon: [name], MD;  Location: [place] [facility];  Service: Cardiovascular;  Laterality: N/A;   REDUCTION MAMMAPLASTY  1990   RIGHT OOPHORECTOMY     TOTAL HIP ARTHROPLASTY Left 05/01/2014   Dr. [name]   TOTAL HIP ARTHROPLASTY Right 08/05/2014   Procedure: TOTAL HIP ARTHROPLASTY ANTERIOR APPROACH;  Surgeon: [name], MD;  Location: [facility];  Service: Orthopedics;  Laterality: Right;   ULNAR NERVE TRANSPOSITION Right 02/11/2016   Procedure: ULNAR NERVE DECOMPRESSION/TRANSPOSITION;  Surgeon: [name], MD;  Location: [facility];  Service: Orthopedics;  Laterality: Right;   VIDEO BRONCHOSCOPY WITH ENDOBRONCHIAL ULTRASOUND Left 12/19/2018   Procedure: VIDEO BRONCHOSCOPY WITH ENDOBRONCHIAL ULTRASOUND, LEFT, SLEEP APNEA;  Surgeon: [name], MD;  Location: [facility];  Service: Thoracic;  Laterality: Left;   VIDEO BRONCHOSCOPY WITH ENDOBRONCHIAL ULTRASOUND Right 05/14/2021   Procedure: VIDEO BRONCHOSCOPY WITH  ENDOBRONCHIAL ULTRASOUND;  Surgeon: Tyler Pita, MD;  Location: [facility];  Service: Pulmonary;  Laterality: Right;   Social History:  reports that she quit smoking about 14 years ago. Her smoking use included cigarettes. She has a 40.00 pack-year smoking history. She has never used smokeless tobacco. She reports that she does not drink alcohol and does not use drugs.  Allergies  Allergen Reactions   Aspirin Hives, Shortness Of Breath and Other (See Comments)    Difficulty breathing   Celebrex [Celecoxib] Shortness Of Breath   Morphine And Related Nausea And Vomiting and Swelling   Adhesive [Tape] Other (See Comments)    Took top layer of skin off when removed.   Clarithromycin Nausea And Vomiting   Codeine Nausea And Vomiting   Darvon [Propoxyphene Hcl] Nausea And Vomiting   Demerol [Meperidine] Nausea And Vomiting   Flonase [Fluticasone Propionate] Other (See Comments)    Fungal infection in nose   Simvastatin Other (See Comments)    "caused ulcers in mouth, and fever"   Talwin [Pentazocine] Nausea And Vomiting    Family History  Problem Relation Age of Onset   Heart disease  Mother        s/p stent   Hypertension Mother    Hypercholesterolemia Mother    Diabetes Father    Stomach cancer Other        uncle   Breast cancer Cousin    Breast cancer Paternal Aunt     Prior to Admission medications   Medication Sig Start Date End Date Taking? Authorizing Provider  fluconazole (DIFLUCAN) 100 MG tablet Take 1 tablet (100 mg total) by mouth daily for 7 days. 02/04/22 02/11/22 Yes Delman Kitten, MD  acetaminophen (TYLENOL) 500 MG tablet Take 500 mg by mouth 2 (two) times daily.    [provider]  albuterol (VENTOLIN HFA) 108 (90 Base) MCG/ACT inhaler Inhale 2 puffs into the lungs every 6 (six) hours as needed for wheezing or shortness of breath.    [provider]  Bioflavonoid Products (ESTER C PO) Take 1 tablet by mouth 3 (three) times daily. 500 mg     [provider]  Calcium-Magnesium-Zinc (CAL-MAG-ZINC PO) Take 1 tablet by mouth daily.    [provider]  carboxymethylcellulose (REFRESH PLUS) 0.5 % SOLN Place 1 drop into both eyes 3 (three) times daily as needed (dry eyes).    [provider]  cetirizine (ZYRTEC) 5 MG tablet Take 1 tablet (5 mg total) by mouth daily. Patient not taking: Reported on 12/29/2021 04/17/18   Coral Spikes, DO  chlorhexidine (PERIDEX) 0.12 % solution SMARTSIG:By Mouth Patient not taking: Reported on 11/17/2021 09/28/21   [provider]  Cholecalciferol (EQL VITAMIN D3) 25 MCG (1000 UT) capsule Take 1,000 Units by mouth daily.    [provider]  Coenzyme Q10 (CO Q 10) 100 MG CAPS Take 100 mg by mouth daily.     [provider]  Cyanocobalamin (VITAMIN B-12) 2500 MCG SUBL Place 2,500 mcg under the tongue daily.    [provider]  cyclobenzaprine (FLEXERIL) 10 MG tablet Take 10 mg by mouth at bedtime.    [provider]  DHEA 25 MG CAPS Take 25 mg by mouth daily.    [provider]  ezetimibe (ZETIA) 10 MG tablet Take 5 mg by mouth in the morning and at bedtime. 10/26/18   [provider]  folic acid (FOLVITE) 161 MCG tablet Take 400 mcg by mouth daily.    [provider]  gabapentin (NEURONTIN) 100 MG capsule Take 100 mg by mouth 3 (three) times daily. Patient not taking: Reported on 10/06/2021    [provider]  Garlic 0960 MG CAPS Take 1,000 mg by mouth daily. Patient not taking: Reported on 12/29/2021    [provider]  Histamine Dihydrochloride (AUSTRALIAN DREAM ARTHRITIS) 0.025 % CREA Apply 1 application. topically 4 (four) times daily as needed (pain).    [provider]  HYDROcodone-acetaminophen (NORCO/VICODIN) 5-325 MG tablet 1-2 tabs po bid prn Patient not taking: Reported on 11/17/2021 12/09/18   Norval Gable, MD  levOCARNitine (L-CARNITINE) 250 MG TABS Take 1 tablet by mouth daily.  With Chromium 250 mg    [provider]  meclizine (ANTIVERT) 25 MG tablet Take 1 tablet (25 mg total) by mouth every 6 (six) hours as needed for dizziness. Patient not taking: Reported on 11/17/2021 06/11/19   Sindy Guadeloupe, MD  ondansetron (ZOFRAN) 8 MG tablet Take by mouth every 8 (eight) hours as needed for nausea or vomiting.    [provider]  pantoprazole (PROTONIX) 40 MG tablet Take 40 mg by mouth 2 (two) times  daily.    [provider]  polyethylene glycol powder (GLYCOLAX/MIRALAX) 17 GM/SCOOP powder Take 17 g by mouth daily as needed for moderate constipation.    [provider]  pyridOXINE (VITAMIN B-6) 100 MG tablet Take 100 mg by mouth daily.    [provider]  Selenium 200 MCG CAPS Take 200 mcg by mouth daily.    [provider]  Sennosides (SENNA) 8.6 MG CAPS Take 1 capsule by mouth daily as needed (constipation).    [provider]  sucralfate (CARAFATE) 1 GM/10ML suspension Take 1 g by mouth daily. 02/13/19   [provider]  Tiotropium Bromide-Olodaterol (STIOLTO RESPIMAT) 2.5-2.5 MCG/ACT AERS Inhale 2 puffs into the lungs daily. 06/07/21   Tyler Pita, MD  tobramycin-dexamethasone [facility]) ophthalmic solution SMARTSIG:1 Drop(s) In Eye(s) 2-3 Times Daily PRN 08/26/21   [provider]  triamcinolone (NASACORT) 55 MCG/ACT AERO nasal inhaler Place 2 sprays into the nose daily.    [provider]  UBRELVY 100 MG TABS Take 100 mg by mouth daily as needed (migraines). 01/30/20   [provider]  Vitamin A 2400 MCG (8000 UT) CAPS Take 8,000 Units by mouth daily.    [provider]  Vitamin E 200 units TABS Take 200 Units by mouth daily.    [provider]  Wheat Dextrin (EQ FIBER POWDER PO) Take by mouth.    [provider]    Physical Exam: Vitals:   02/04/22 1026 02/04/22 1450  BP: (!) 141/69 (!) 140/70  Pulse: 90 88  Resp: 18 18  Temp: 98.4 F  (36.9 C) 98 F (36.7 C)  TempSrc: Oral Oral  SpO2: 93% 94%  Weight: 79.2 kg   Height: 5\' 1"  (1.549 m)    Physical Exam Vitals and nursing note reviewed.  Constitutional:      Appearance: She is well-developed.     Comments: Chronically ill-appearing  HENT:     Head: Normocephalic.     Mouth/Throat:     Mouth: Mucous membranes are moist.  Eyes:     Pupils: Pupils are equal, round, and reactive to light.  Cardiovascular:     Rate and Rhythm: Tachycardia present.  Pulmonary:     Effort: Tachypnea present.     Breath sounds: Examination of the right-middle field reveals decreased breath sounds and rhonchi. Examination of the right-lower field reveals decreased breath sounds and rhonchi. Decreased breath sounds and rhonchi present.  Abdominal:     General: Bowel sounds are normal.     Palpations: Abdomen is soft.  Musculoskeletal:     Cervical back: Normal range of motion and neck supple.     Right lower leg: Edema present.     Left lower leg: Edema present.  Skin:    General: Skin is warm and dry.  Neurological:     General: No focal deficit present.     Mental Status: She is alert.  Psychiatric:        Mood and Affect: Mood normal.        Behavior: Behavior normal.     Data Reviewed: Relevant notes from primary care and specialist visits, past discharge summaries as available in EHR, including Care Everywhere. Prior diagnostic testing as pertinent to current admission diagnoses Updated medications and problem lists for reconciliation ED course, including vitals, labs, imaging, treatment and response to treatment Triage notes, nursing and pharmacy notes and ED provider's notes Notable results as noted in HPI Labs reviewed.  BNP 130, sodium 140, potassium  4.2, chloride 104, bicarb 27, glucose 106, BUN 10, creatinine 0.57, calcium 9.4, total protein 6.7, albumin 3.7, AST 23, ALT 14, alkaline phosphatase 82, total bilirubin 0.6, white count 4.9, hemoglobin 10.2, hematocrit  31.9, platelet count 295 Respiratory viral panel is negative Chest x-ray reviewed by me shows scarring with bronchiectasis in the right lower lobe with volume loss and elevation of the right hemidiaphragm, unchanged. This may represent postsurgical changes/treatment response.Small right pleural effusion, underlying infectious/inflammatory process can not be excluded. Twelve-lead EKG reviewed by me showed sinus rhythm with fusion complexes. There are no new results to review at this time.  Assessment and Plan: * Malignant neoplasm of upper lobe of left lung [facility]) Patient with a history of recurrent adenocarcinoma of the left lung currently on chemotherapy. Has completed radiation therapy Follow-up with oncology as an outpatient  Aspiration pneumonia [facility]) Patient presents to the ER for evaluation of right-sided pleuritic chest pain, cough productive of blood-tinged phlegm, worsening shortness of breath associated with fever and chills. Has a history of vocal cord dysfunction related to have recovered malignancy She has dysphagia for both solids and liquids We will request speech therapy for swallow function evaluation Consult pulmonary We will treat patient with Unasyn adjusted to renal function Follow-up results of blood cultures  Benign essential hypertension Monitor blood pressure closely  Sleep apnea Continue CPAP at bedtime      Advance Care Planning:   Code Status: DNR   Consults: Pulmonary, speech therapy  Family Communication: Greater than 50% of time was spent discussing patient's condition and plan of care with her at the bedside.  All questions and concerns have been addressed.  She verbalizes understanding and agrees with the plan.  CODE STATUS was discussed and she wishes to be DNR.  Severity of Illness: The appropriate patient status for this patient is INPATIENT. Inpatient status is judged to be reasonable and necessary in order to provide the required intensity of  service to ensure the patient's safety. The patient's presenting symptoms, physical exam findings, and initial radiographic and laboratory data in the context of their chronic comorbidities is felt to place them at high risk for further clinical deterioration. Furthermore, it is not anticipated that the patient will be medically stable for discharge from the hospital within 2 midnights of admission.   * I certify that at the point of admission it is my clinical judgment that the patient will require inpatient hospital care spanning beyond 2 midnights from the point of admission due to high intensity of service, high risk for further deterioration and high frequency of surveillance required.*  Author: Collier Bullock, MD 02/04/2022 5:24 PM  For on call review [email or web address].

## 2022-02-04 NOTE — ED Notes (Signed)
Request made for transport to the floor ?

## 2022-02-05 ENCOUNTER — Inpatient Hospital Stay: Payer: Medicare Other

## 2022-02-05 DIAGNOSIS — I10 Essential (primary) hypertension: Secondary | ICD-10-CM

## 2022-02-05 DIAGNOSIS — J90 Pleural effusion, not elsewhere classified: Secondary | ICD-10-CM | POA: Diagnosis not present

## 2022-02-05 DIAGNOSIS — J690 Pneumonitis due to inhalation of food and vomit: Secondary | ICD-10-CM

## 2022-02-05 DIAGNOSIS — C3412 Malignant neoplasm of upper lobe, left bronchus or lung: Secondary | ICD-10-CM | POA: Diagnosis not present

## 2022-02-05 LAB — CBC
HCT: 29.6 % — ABNORMAL LOW (ref 36.0–46.0)
Hemoglobin: 9.6 g/dL — ABNORMAL LOW (ref 12.0–15.0)
MCH: 30.5 pg (ref 26.0–34.0)
MCHC: 32.4 g/dL (ref 30.0–36.0)
MCV: 94 fL (ref 80.0–100.0)
Platelets: 312 10*3/uL (ref 150–400)
RBC: 3.15 MIL/uL — ABNORMAL LOW (ref 3.87–5.11)
RDW: 14.8 % (ref 11.5–15.5)
WBC: 4 10*3/uL (ref 4.0–10.5)
nRBC: 0 % (ref 0.0–0.2)

## 2022-02-05 LAB — BASIC METABOLIC PANEL
Anion gap: 7 (ref 5–15)
BUN: 8 mg/dL (ref 8–23)
CO2: 27 mmol/L (ref 22–32)
Calcium: 9 mg/dL (ref 8.9–10.3)
Chloride: 111 mmol/L (ref 98–111)
Creatinine, Ser: 0.61 mg/dL (ref 0.44–1.00)
GFR, Estimated: >60 mL/min (ref >60)
Glucose, Bld: 104 mg/dL — ABNORMAL HIGH (ref 70–99)
Potassium: 4.4 mmol/L (ref 3.5–5.1)
Sodium: 145 mmol/L (ref 135–145)

## 2022-02-05 LAB — GLUCOSE, CAPILLARY
Glucose-Capillary: 133 mg/dL — ABNORMAL HIGH (ref 70–99)
Glucose-Capillary: 101 mg/dL — ABNORMAL HIGH (ref 70–99)

## 2022-02-05 LAB — GRAM STAIN

## 2022-02-05 LAB — PROCALCITONIN: Procalcitonin: <0.1 ng/mL

## 2022-02-05 MED ORDER — HYDROCODONE-ACETAMINOPHEN 5-325 MG PO TABS
1.0000 | ORAL_TABLET | Freq: Every day | ORAL | Status: DC
  Administered 2022-02-05 – 2022-02-08 (×4): 1 via ORAL
  Filled 2022-02-05 (×4): qty 1

## 2022-02-05 MED ORDER — VITAMIN B-12 1000 MCG PO TABS
2500.0000 ug | ORAL_TABLET | Freq: Every day | ORAL | Status: DC
  Administered 2022-02-05 – 2022-02-09 (×5): 2500 ug via ORAL
  Filled 2022-02-05 (×5): qty 1

## 2022-02-05 MED ORDER — SUMATRIPTAN SUCCINATE 50 MG PO TABS: 100.0000 mg | ORAL_TABLET | ORAL | Status: DC | PRN

## 2022-02-05 NOTE — Hospital Course (Addendum)
76 y.o. 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  Status post thoracentesis 11/25: Repeat CT chest looks stable or improved from before 11/26: Pending fluid results.  PT eval, still coughing 11/27: Chest PT, Levaquin and prednisone started per pulmonary as patient still quite symptomatic 11/28: Slow improvement, palliative care consult

## 2022-02-05 NOTE — Progress Notes (Signed)
  Progress Note   Patient: CHAMPAGNE PALETTA XKP:[id number] DOB: [birth year] DOA: 02/04/2022     1 DOS: the patient was seen and examined on 02/05/2022   Brief hospital course: 76 y.o. 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  Status post thoracentesis 11/25: Repeat CT chest to evaluate need for chest tube  Assessment and Plan: * Malignant neoplasm of upper lobe of left lung [facility]) Patient with a history of recurrent adenocarcinoma of the left lung currently on chemotherapy. Has completed radiation therapy Follow-up with oncology Dr. Janese Banks as an outpatient  Aspiration pneumonia [facility]) Patient presents to the ER for evaluation of right-sided pleuritic chest pain, cough productive of blood-tinged phlegm, worsening shortness of breath associated with fever and chills. 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  Hold meds with liquid as able Seen by pulmonary s/p thoracentesis yesterday with pending fluid studies.  Repeat CT chest today Antibiotic changed to Zosyn and Zithromax  Negative blood cultures till now  Pleural effusion on right Status post thoracentesis on 11/24 by pulmonary.  Fluid studies sent.  Results pending. Repeat CT chest today to evaluate need for chest tube  Benign essential hypertension Controlled for now  Sleep apnea Continue CPAP at bedtime.  Patient refused it last night        Subjective: Feeling better status post  thoracentesis.  Hoping to eat as she is hungry  Physical Exam: Vitals:   02/05/22 0151 02/05/22 0550 02/05/22 0613 02/05/22 0746  BP:  128/65  106/78  Pulse:  90  88  Resp:  18  18  Temp:  97.8 F (36.6 C)  98.3 F (36.8 C)  TempSrc:  Oral    SpO2: 93% 94% 94% (!) 89%  Weight:      Height:       76 year old female lying in the bed comfortably without any acute distress Lungs decreased breath sounds at the bases right more than the left.  Rhonchi at the right lung base Cardiovascular regular rate and rhythm Abdomen soft, benign Neuro alert and awake, nonfocal Skin no rash or lesion Data Reviewed:  There are no new results to review at this time.  Family Communication: None  Disposition: Status is: Inpatient Remains inpatient appropriate because: Workup for pleural effusion  Planned Discharge Destination: Home   DVT prophylaxis-Lovenox Time spent: 35 minutes  Author: Max Sane, MD 02/05/2022 10:23 AM  For on call review [email or web address].

## 2022-02-05 NOTE — Assessment & Plan Note (Signed)
Status post thoracentesis on 11/24 by pulmonary.  Fluid studies sent.  Results pending. Repeat CT chest today to evaluate need for chest tube

## 2022-02-05 NOTE — Progress Notes (Signed)
Cpap refused 

## 2022-02-05 NOTE — Progress Notes (Signed)
Speech Language Pathology Treatment: Dysphagia  Patient Details Name: Jennifer Hodges MRN: [id number] DOB: 1946-01-15 Today's Date: 02/05/2022 Time: 6712-4580 SLP Time Calculation (min) (ACUTE ONLY): 40 min  Assessment / Plan / Recommendation Clinical Impression  Pt seen for ongoing assessment of swallowing. She is alert, verbally responsive and engaged in conversation w/ SLP; Family present in room.  Pt is on RA; wbc wnl. Afebrile. Pt explained general aspiration precautions and agreed verbally to the need for following them especially sitting upright for all oral intake -- supported behind the back more for full upright sitting. She fed herself late lunch meal of a Salad and coffee:  solids and thin liquids via cup. NO overt clinical s/s of aspiration were noted w/ any consistency; respiratory status remained calm and unlabored, vocal quality clear b/t trials. Oral phase appeared grossly [facility] for bolus management and timely A-P transfer for swallowing; oral clearing achieved w/ all consistencies. Recommended pills whole in a Puree for ease of swallowing tablets; better cohesion. Also discussed REFLUX precautions d/t pt's c/o Esophageal phase dysmotility. NSG denied any deficits in swallowing as well.   Pt appears at reduced risk for aspriation when following general aspiration precautions w/ oral intake. Recommend continue a Regular/mech soft diet for ease of soft foods w/ gravies added to moisten foods; Thin liquids. Recommend general aspiration precautions; Pills Whole in Puree; tray setup and positioning assistance for meals. REFLUX precautions d/t pt's baseline complaints. ST services will sign off at this time w/ MD to reconsult if needed while admitted. NSG updated. Precautions posted at bedside. Pt agreed.      HPI HPI: Jennifer Hodges is a 76 y.o. 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 She states that she has had symptoms that have progressively worsened prompting her visit to the emergency room. She also complains of constipation She denies having any nausea, no vomiting, no abdominal pain, no urinary symptoms, no dizziness, no lightheadedness, no headache, no blurred vision, no focal deficit.  Thoracentesis completed on 02/04/22. CT Chest 02/04/22: Negative for pulmonary embolism. Signs of RIGHT middle lobe consolidation that has developed since  previous imaging where there was mainly ground-glass about the central RIGHT chest. Findings also associated with increasing  pleural fluid with loculated appearance in the LEFT chest but without signs of pleural enhancement at this time. Correlate with any symptoms of pneumonia. Findings could also reflect pneumonitis and should be correlated with any ongoing therapy. Slit like appearance of mainstem bronchi raising the question of bronchomalacia. Material in RIGHT lower lobe bronchi may represent aspirated or inspissated secretions. Correlate with any risk factors for or history of aspiration. There is no large volume material in the trachea or LEFT mainstem bronchus. Stable appearance of post treatment changes about the LEFT hilum. Pt previously seen by SLP services for voice intervention related to left sided vocal fold paralysis in 2021. No history or reported dysphagia intervention.      SLP Plan  All goals met      Recommendations  for follow up therapy are one component of a multi-disciplinary discharge  planning process, led by the attending physician.  Recommendations may be updated based on patient status, additional functional criteria and insurance authorization.    Recommendations  Diet recommendations: Regular;Thin liquid Liquids provided via: Cup Medication Administration: Whole meds with puree (as needed for ease of swallowing; safety of swallowing) Supervision: Patient able to self feed Compensations: Minimize environmental distractions;Slow rate;Small sips/bites;Follow solids with liquid Postural Changes and/or Swallow Maneuvers: Out of bed for meals;Seated upright 90 degrees;Upright 30-60 min after meal (REFLUX precautions)                General recommendations:  (GI f/u for reflux management) Oral Care Recommendations: Oral care BID;Oral care before and after PO;Patient independent with oral care Follow Up Recommendations: No SLP follow up Assistance recommended at discharge: PRN SLP Visit Diagnosis: Dysphagia, unspecified (R13.10) Plan: All goals met             Orinda Kenner, [place], [place]; [place] [phone] (ascom) Ezell Melikian  02/05/2022, 4:58 PM

## 2022-02-05 NOTE — Progress Notes (Signed)
Mobility Specialist - Progress Note   02/05/22 1102  Mobility  Activity Ambulated with assistance in hallway;Stood at bedside;Dangled on edge of bed  Level of Assistance Standby assist, set-up cues, supervision of patient - no hands on  Assistive Device Front wheel walker  Distance Ambulated (ft) 80 ft  Activity Response Tolerated well  Mobility Referral Yes  $Mobility charge 1 Mobility   Pt supine asleep in bed on RA upon arrival. Pt awakens to voice and willing to participate with mobility. Pt completes bed mobility, STS, and ambulates in hallway SBA with no LOB. Pt needs VC for RW management. Pt returns to bed with needs in reach.   Gretchen Short  Mobility Specialist  02/05/22 11:04 AM

## 2022-02-06 ENCOUNTER — Encounter: Payer: Self-pay | Admitting: Oncology

## 2022-02-06 DIAGNOSIS — C3412 Malignant neoplasm of upper lobe, left bronchus or lung: Secondary | ICD-10-CM | POA: Diagnosis not present

## 2022-02-06 DIAGNOSIS — J90 Pleural effusion, not elsewhere classified: Secondary | ICD-10-CM | POA: Diagnosis not present

## 2022-02-06 DIAGNOSIS — I10 Essential (primary) hypertension: Secondary | ICD-10-CM | POA: Diagnosis not present

## 2022-02-06 DIAGNOSIS — J690 Pneumonitis due to inhalation of food and vomit: Secondary | ICD-10-CM | POA: Diagnosis not present

## 2022-02-06 LAB — BASIC METABOLIC PANEL
Anion gap: 8 (ref 5–15)
BUN: 8 mg/dL (ref 8–23)
CO2: 25 mmol/L (ref 22–32)
Calcium: 8.6 mg/dL — ABNORMAL LOW (ref 8.9–10.3)
Chloride: 107 mmol/L (ref 98–111)
Creatinine, Ser: 0.67 mg/dL (ref 0.44–1.00)
GFR, Estimated: >60 mL/min (ref >60)
Glucose, Bld: 92 mg/dL (ref 70–99)
Potassium: 3.9 mmol/L (ref 3.5–5.1)
Sodium: 140 mmol/L (ref 135–145)

## 2022-02-06 LAB — GLUCOSE, CAPILLARY
Glucose-Capillary: 82 mg/dL (ref 70–99)
Glucose-Capillary: 99 mg/dL (ref 70–99)
Glucose-Capillary: 111 mg/dL — ABNORMAL HIGH (ref 70–99)
Glucose-Capillary: 124 mg/dL — ABNORMAL HIGH (ref 70–99)

## 2022-02-06 LAB — CBC
HCT: 28.6 % — ABNORMAL LOW (ref 36.0–46.0)
Hemoglobin: 9.5 g/dL — ABNORMAL LOW (ref 12.0–15.0)
MCH: 31.3 pg (ref 26.0–34.0)
MCHC: 33.2 g/dL (ref 30.0–36.0)
MCV: 94.1 fL (ref 80.0–100.0)
Platelets: 319 10*3/uL (ref 150–400)
RBC: 3.04 MIL/uL — ABNORMAL LOW (ref 3.87–5.11)
RDW: 15 % (ref 11.5–15.5)
WBC: 2.8 10*3/uL — ABNORMAL LOW (ref 4.0–10.5)
nRBC: 0 % (ref 0.0–0.2)

## 2022-02-06 LAB — PROCALCITONIN: Procalcitonin: <0.1 ng/mL

## 2022-02-06 MED ORDER — CHLORHEXIDINE GLUCONATE CLOTH 2 % EX PADS
6.0000 | MEDICATED_PAD | Freq: Every day | CUTANEOUS | Status: DC
  Administered 2022-02-06 – 2022-02-09 (×4): 6 via TOPICAL

## 2022-02-06 MED ORDER — IPRATROPIUM-ALBUTEROL 0.5-2.5 (3) MG/3ML IN SOLN
3.0000 mL | Freq: Three times a day (TID) | RESPIRATORY_TRACT | Status: DC
  Administered 2022-02-06 – 2022-02-07 (×5): 3 mL via RESPIRATORY_TRACT
  Filled 2022-02-06 (×5): qty 3

## 2022-02-06 MED ORDER — SODIUM CHLORIDE 0.9% FLUSH
10.0000 mL | INTRAVENOUS | Status: DC | PRN
  Administered 2022-02-08: 10 mL

## 2022-02-06 NOTE — Progress Notes (Signed)
  Progress Note   Patient: Jennifer Hodges LAG:[id number] DOB: [birth year] DOA: 02/04/2022     2 DOS: the patient was seen and examined on 02/06/2022   Brief hospital course: 76 y.o. 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  Status post thoracentesis 11/25: Repeat CT chest looks stable or improved from before 11/26: Pending fluid results.  PT eval, still coughing  Assessment and Plan: * Malignant neoplasm of upper lobe of left lung [facility]) Patient with a history of recurrent adenocarcinoma of the left lung currently on chemotherapy. Has completed radiation therapy Follow-up with oncology Dr. Janese Banks as an outpatient  Aspiration pneumonia [facility]) Patient presents to the ER for evaluation of right-sided pleuritic chest pain, cough productive of blood-tinged phlegm, worsening shortness of breath associated with fever and chills. 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  Hold meds with liquid as able Seen by pulmonary s/p thoracentesis with pending fluid studies.  Repeat CT chest on 11/25 shows stability or improvement from before Continue Zosyn and Zithromax  Negative blood cultures till now Patient still coughing  Pleural effusion on right Status post thoracentesis on 11/24 by pulmonary.  Fluid studies sent.  Results pending.  Await cytology for oncology to see her tomorrow Repeat CT chest on 11/25 shows improvement/stable findings from before  Benign  essential hypertension Controlled for now  Sleep apnea Continue CPAP at bedtime.         Subjective: Coughing and short of breath.  Port had to be deaccessed this morning by IV team, husband at bedside  Physical Exam: Vitals:   02/05/22 2030 02/06/22 0531 02/06/22 0725 02/06/22 0817  BP: 116/63 115/68 114/66   Pulse: (!) 102 88 85   Resp: 18 16 18    Temp: 98.4 F (36.9 C) 98.1 F (36.7 C) 98.2 F (36.8 C)   TempSrc:      SpO2: 94% 92% 91% 92%  Weight:      Height:       76 year old female lying in the bed comfortably without any acute distress Lungs Rhonchi at the right lung base, expiratory wheezing throughout both lungs Cardiovascular regular rate and rhythm Abdomen soft, benign Neuro alert and awake, nonfocal Skin no rash or lesion Data Reviewed:  There are no new results to review at this time.  Family Communication: Husband updated at bedside  Disposition: Status is: Inpatient Remains inpatient appropriate because: Await pleural fluid studies especially for cytology and improvement in her symptoms before discharge  Planned Discharge Destination: Home with Home Health   DVT prophylaxis-Lovenox Time spent: 35 minutes  Author: Max Sane, MD 02/06/2022 12:11 PM  For on call review [email or web address].

## 2022-02-06 NOTE — Assessment & Plan Note (Addendum)
Patient presents to the ER for evaluation of right-sided pleuritic chest pain, cough productive of blood-tinged phlegm, worsening shortness of breath associated with fever and chills. 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  Hold meds with liquid as able Seen by pulmonary s/p thoracentesis with pending fluid studies.  Repeat CT chest on 11/25 shows stability or improvement from before Continue Zosyn and Zithromax  Negative blood cultures till now Patient still coughing

## 2022-02-06 NOTE — Assessment & Plan Note (Signed)
Continue CPAP at bedtime 

## 2022-02-06 NOTE — Assessment & Plan Note (Signed)
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 Has completed radiation therapy Follow-up with oncology Dr. Janese Banks as an outpatient

## 2022-02-06 NOTE — Assessment & Plan Note (Signed)
Status post thoracentesis on 11/24 by pulmonary.  Fluid studies sent.  Results pending.  Await cytology for oncology to see her tomorrow Repeat CT chest on 11/25 shows improvement/stable findings from before

## 2022-02-06 NOTE — Consult Note (Signed)
Hematology/Oncology Consult note [facility] [phone] [phone]  Patient Care Team: [name], MD as PCP - General (Family Medicine) Telford Nab, RN as Registered Nurse Noreene Filbert, MD as Referring Physician (Radiation Oncology) Sindy Guadeloupe, MD as Consulting Physician (Oncology)   Name of the patient: Jennifer Hodges  [id number]  [birth year]    Reason for consult: Recurrent lung cancer on chemotherapy   Requesting physician: Dr. Max Sane  Date of visit: 02/06/2022  History of presenting illness-patient is a 76 year old female with3 of recurrent stage III lung cancer on outpatient maintenance Alimta.  She was admitted for symptoms of acute hypoxic respiratory failure and worsening cough.  She had a CT angio chest which was negative for pulmonary embolism.  Right middle lobe consolidation and loculated pleural fluid on the left side.  She is currently being treated for healthcare associated pneumonia with IV antibiotics.  She also underwent thoracentesis on 02/04/2022.  Patient feels somewhat better since admission.  She has a known history of chronic bronchitis and repeated episodes of pneumonia which have been treated with multiple courses of antibiotics as an outpatient.  She follows up with Dr. [name].  ECOG PS- 2  Pain scale- 0   Review of systems- Review of Systems  Constitutional:  Positive for malaise/fatigue. Negative for chills, fever and weight loss.  HENT:  Negative for congestion, ear discharge and nosebleeds.   Eyes:  Negative for blurred vision.  Respiratory:  Positive for cough, sputum production and shortness of breath. Negative for hemoptysis and wheezing.   Cardiovascular:  Negative for chest pain, palpitations, orthopnea and claudication.  Gastrointestinal:  Negative for abdominal pain, blood in stool, constipation, diarrhea, heartburn, melena, nausea and vomiting.  Genitourinary:  Negative for  dysuria, flank pain, frequency, hematuria and urgency.  Musculoskeletal:  Negative for back pain, joint pain and myalgias.  Skin:  Negative for rash.  Neurological:  Negative for dizziness, tingling, focal weakness, seizures, weakness and headaches.  Endo/Heme/Allergies:  Does not bruise/bleed easily.  Psychiatric/Behavioral:  Negative for depression and suicidal ideas. The patient does not have insomnia.     Allergies  Allergen Reactions   Aspirin Hives, Shortness Of Breath and Other (See Comments)    Difficulty breathing   Celebrex [Celecoxib] Shortness Of Breath   Morphine And Related Nausea And Vomiting and Swelling   Adhesive [Tape] Other (See Comments)    Took top layer of skin off when removed.   Clarithromycin Nausea And Vomiting   Codeine Nausea And Vomiting   Darvon [Propoxyphene Hcl] Nausea And Vomiting   Demerol [Meperidine] Nausea And Vomiting   Flonase [Fluticasone Propionate] Other (See Comments)    Fungal infection in nose   Simvastatin Other (See Comments)    "caused ulcers in mouth, and fever"   Talwin [Pentazocine] Nausea And Vomiting    Patient Active Problem List   Diagnosis Date Noted   Pleural effusion on right 02/05/2022   Lobar pneumonia ([place]) 02/04/2022   Aspiration pneumonia ([place]) 02/04/2022   Edema of lower extremity 06/16/2021   Chronic anxiety 06/16/2021   Cervical post-laminectomy syndrome 06/16/2021   Adenocarcinoma of lung ([place]) 06/16/2021   Anemia 05/26/2021   Postobstructive pneumonia 01/06/2021   Benign breast lumps 01/11/2019   Gout 01/11/2019   Hives 01/11/2019   Lung disease 01/11/2019   Migraine headache 01/11/2019   Goals of care, counseling/discussion 12/27/2018   Malignant neoplasm of upper lobe of left lung ([place]) 12/27/2018   Night sweats 08/07/2016   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   Osteopenia of multiple sites 04/27/2016   Left carpal tunnel syndrome 02/02/2016   Carotid stenosis, asymptomatic, bilateral 12/29/2015    Prediabetes 09/28/2015   Primary osteoarthritis involving multiple joints 09/28/2015   Knee pain 05/11/2015   Chest tightness 03/24/2015   Stool incontinence 03/24/2015   Urinary frequency 03/04/2015   Cervical spondylosis with radiculopathy 02/24/2015   Pre-syncope 02/19/2015   URI (upper respiratory infection) 02/15/2015   Neck pain 12/25/2014   Pelvic pain in female 11/16/2014   Primary osteoarthritis of hip 08/05/2014   Heel pain 06/01/2014   Diarrhea 06/01/2014   Health care maintenance 06/01/2014   Right leg pain 03/30/2014   Right arm pain 03/30/2014   Sinusitis 03/30/2014   Internal nasal lesion 11/12/2013   Other specified disorders of nose and nasal sinuses 11/12/2013   Chronic tension-type headache, intractable 10/30/2013   Intractable migraine without aura and without status migrainosus 10/30/2013   Occipital neuralgia of left side 10/30/2013   IBS (irritable bowel syndrome) 09/03/2013   Rib pain 08/04/2013   Cervical radiculitis 08/01/2013   Lumbar radiculitis 08/01/2013   Palpitations 07/25/2013   Benign essential hypertension 05/28/2013   Neuralgia, neuritis, and radiculitis, unspecified 05/28/2013   Vitamin D deficiency 05/28/2013   Elevated AFP 04/28/2013   Abnormality of alpha-fetoprotein 04/28/2013   Osteoporosis 11/12/2012   Incomplete emptying of bladder 07/11/2012   Prolapse of vaginal vault after hysterectomy 07/11/2012   Nonspecific (abnormal) findings on radiological and other examination of gastrointestinal tract 05/31/2012   Bronchiectasis ([place]) 02/05/2012   Sleep apnea 02/05/2012   Degenerative disc disease, cervical 02/05/2012   Hypercholesterolemia 02/05/2012   GERD (gastroesophageal reflux disease) 02/05/2012   Chronic headaches 02/05/2012     Past Medical History:  Diagnosis Date   Anemia    Asthma    No Inhalers--Dr. [name] will order as needed   Bronchiectasis ([place])    mild   Chronic headaches     followed by [facility]  migraines   COPD (chronic obstructive pulmonary disease) ([place])    DDD (degenerative disc disease), lumbar    Diverticulosis    Family history of adverse reaction to anesthesia    mother and sisters-PONV   Gall stones    history of   GERD (gastroesophageal reflux disease)    EGD 8/09- non bleeding erosive gastritis, documentd esophageal ulcerations.    Hiatal hernia    small   History of kidney stones    History of pneumonia    Hypercholesterolemia    IBS (irritable bowel syndrome)    Malignant neoplasm of upper lobe of left lung ([place]) 12/27/2018   Meniere disease    Murmur    Osteoarthritis    lumbar disc disease, left hip   Personal history of chemotherapy    Personal history of radiation therapy    Pneumonia 12/2020   PONV (postoperative nausea and vomiting)    Sleep apnea    uses cpap   Vertigo    Weakness of right side of body      Past Surgical History:  Procedure Laterality Date   ABDOMINAL HYSTERECTOMY  age 4   ANTERIOR CERVICAL DECOMP/DISCECTOMY FUSION N/A 02/24/2015   Procedure: CERVICAL FOUR-FIVE, CERVICAL FIVE-SIX, CERVICAL SIX-SEVEN ANTERIOR CERVICAL DECOMPRESSION/DISCECTOMY FUSION ;  Surgeon: [name], MD;  Location: [facility];  Service: Neurosurgery;  Laterality: N/A;  C45 C56 C67 anterior cervical decompression with fusion interbody prosthesis plating and bonegraft   Agency  4th lumbar fusion   BLADDER SURGERY N/A    with vaginal wall repair   BREAST CYST ASPIRATION Bilateral    neg   BREAST SURGERY Bilateral    cyst removed and reduction   CARDIAC CATHETERIZATION  2014   CARPAL TUNNEL RELEASE Right 02/11/2016   Procedure: CARPAL TUNNEL RELEASE;  Surgeon: [name], MD;  Location: [facility];  Service: Orthopedics;  Laterality: Right;   CATARACT EXTRACTION W/ INTRAOCULAR LENS IMPLANT Bilateral 2015   CHOLECYSTECTOMY     EUS N/A 05/31/2012   Procedure: UPPER ENDOSCOPIC ULTRASOUND (EUS) LINEAR;  Surgeon:  [name], MD;  Location: [facility];  Service: Endoscopy;  Laterality: N/A;   EXCISIONAL HEMORRHOIDECTOMY     JOINT REPLACEMENT Bilateral    KNEE ARTHROSCOPY WITH LATERAL MENISECTOMY Right 07/07/2015   Procedure: KNEE ARTHROSCOPY WITH LATERAL MENISECTOMY, PARTIAL SYNOVECTOMY;  Surgeon: [name], MD;  Location: [facility];  Service: Orthopedics;  Laterality: Right;   LUMBAR LAMINECTOMY     PORTA CATH INSERTION N/A 01/07/2019   Procedure: PORTA CATH INSERTION;  Surgeon: [name], MD;  Location: [facility];  Service: Cardiovascular;  Laterality: N/A;   REDUCTION MAMMAPLASTY  1990   RIGHT OOPHORECTOMY     TOTAL HIP ARTHROPLASTY Left 05/01/2014   Dr. [name]   TOTAL HIP ARTHROPLASTY Right 08/05/2014   Procedure: TOTAL HIP ARTHROPLASTY ANTERIOR APPROACH;  Surgeon: [name], MD;  Location: [facility];  Service: Orthopedics;  Laterality: Right;   ULNAR NERVE TRANSPOSITION Right 02/11/2016   Procedure: ULNAR NERVE DECOMPRESSION/TRANSPOSITION;  Surgeon: [name], MD;  Location: [facility];  Service: Orthopedics;  Laterality: Right;   VIDEO BRONCHOSCOPY WITH ENDOBRONCHIAL ULTRASOUND Left 12/19/2018   Procedure: VIDEO BRONCHOSCOPY WITH ENDOBRONCHIAL ULTRASOUND, LEFT, SLEEP APNEA;  Surgeon: [name], MD;  Location: [facility];  Service: Thoracic;  Laterality: Left;   VIDEO BRONCHOSCOPY WITH ENDOBRONCHIAL ULTRASOUND Right 05/14/2021   Procedure: VIDEO BRONCHOSCOPY WITH ENDOBRONCHIAL ULTRASOUND;  Surgeon: Tyler Pita, MD;  Location: [facility];  Service: Pulmonary;  Laterality: Right;    Social History   Socioeconomic History   Marital status: Married    Spouse name: Not on file   Number of children: Not on file   Years of education: Not on file   Highest education level: Not on file  Occupational History   Not on file  Tobacco Use   Smoking status: Former    Packs/day: 1.00    Years: 40.00    Total pack years: 40.00    Types: Cigarettes    Quit date:  06/13/2007    Years since quitting: 14.6   Smokeless tobacco: Never  Vaping Use   Vaping Use: Never used  Substance and Sexual Activity   Alcohol use: No    Alcohol/week: 0.0 standard drinks of alcohol   Drug use: No   Sexual activity: Not on file  Other Topics Concern   Not on file  Social History Narrative   Not on file   Social Determinants of Health   Financial Resource Strain: Not on file  Food Insecurity: No Food Insecurity (02/05/2022)   Hunger Vital Sign    Worried About Running Out of Food in the Last Year: Never true    Ran Out of Food in the Last Year: Never true  Transportation Needs: No Transportation Needs (02/04/2022)   PRAPARE - Hydrologist (Medical): No    Lack of Transportation (Non-Medical): No  Physical Activity: Not on file  Stress:  Not on file  Social Connections: Not on file  Intimate Partner Violence: Not At Risk (02/04/2022)   Humiliation, Afraid, Rape, and Kick questionnaire    Fear of Current or Ex-Partner: No    Emotionally Abused: No    Physically Abused: No    Sexually Abused: No     Family History  Problem Relation Age of Onset   Heart disease Mother        s/p stent   Hypertension Mother    Hypercholesterolemia Mother    Diabetes Father    Stomach cancer Other        uncle   Breast cancer Cousin    Breast cancer Paternal Aunt      Current Facility-Administered Medications:    acetaminophen (TYLENOL) tablet 500 mg, 500 mg, Oral, BID, Agbata, Tochukwu, MD, 500 mg at 02/06/22 0853   azithromycin (ZITHROMAX) 500 mg in sodium chloride 0.9 % 250 mL IVPB, 500 mg, Intravenous, Q24H, Dgayli, Khabib, MD, Last Rate: 250 mL/hr at 02/06/22 0533, 500 mg at 02/06/22 0533   Chlorhexidine Gluconate Cloth 2 % PADS 6 each, 6 each, Topical, Daily, Manuella Ghazi, Vipul, MD   cyanocobalamin (VITAMIN B12) tablet 2,500 mcg, 2,500 mcg, Oral, Daily, Max Sane, MD, 2,500 mcg at 02/06/22 0853   cyclobenzaprine (FLEXERIL) tablet 10 mg, 10  mg, Oral, QHS, Agbata, Tochukwu, MD, 10 mg at 02/05/22 2118   enoxaparin (LOVENOX) injection 40 mg, 40 mg, Subcutaneous, Q24H, Agbata, Tochukwu, MD, 40 mg at 02/05/22 2118   ezetimibe (ZETIA) tablet 5 mg, 5 mg, Oral, Daily, Agbata, Tochukwu, MD, 5 mg at 60/63/01 6010   folic acid (FOLVITE) tablet 0.5 mg, 500 mcg, Oral, Daily, Agbata, Tochukwu, MD, 0.5 mg at 02/06/22 0853   HYDROcodone-acetaminophen (NORCO/VICODIN) 5-325 MG per tablet 1 tablet, 1 tablet, Oral, QHS, Sharion Settler, NP, 1 tablet at 02/05/22 2118   ipratropium-albuterol (DUONEB) 0.5-2.5 (3) MG/3ML nebulizer solution 3 mL, 3 mL, Nebulization, Q6H PRN, Agbata, Tochukwu, MD   ipratropium-albuterol (DUONEB) 0.5-2.5 (3) MG/3ML nebulizer solution 3 mL, 3 mL, Nebulization, TID, Manuella Ghazi, Vipul, MD, 3 mL at 02/06/22 0816   ondansetron (ZOFRAN) tablet 4 mg, 4 mg, Oral, Q6H PRN **OR** ondansetron (ZOFRAN) injection 4 mg, 4 mg, Intravenous, Q6H PRN, Agbata, Tochukwu, MD, 4 mg at 02/05/22 0552   pantoprazole (PROTONIX) EC tablet 40 mg, 40 mg, Oral, BID, Agbata, Tochukwu, MD, 40 mg at 02/06/22 0853   piperacillin-tazobactam (ZOSYN) IVPB 3.375 g, 3.375 g, Intravenous, Q8H, Vira Blanco, RPH, Last Rate: 12.5 mL/hr at 02/06/22 0702, 3.375 g at 02/06/22 9323   polyethylene glycol (MIRALAX / GLYCOLAX) packet 17 g, 17 g, Oral, Daily PRN, Agbata, Tochukwu, MD, 17 g at 02/06/22 0933   polyvinyl alcohol (LIQUIFILM TEARS) 1.4 % ophthalmic solution 1 drop, 1 drop, Both Eyes, TID PRN, Agbata, Tochukwu, MD   senna (SENOKOT) tablet 8.6 mg, 1 tablet, Oral, Daily PRN, Agbata, Tochukwu, MD   sodium chloride flush (NS) 0.9 % injection 10-40 mL, 10-40 mL, Intracatheter, PRN, Manuella Ghazi, Vipul, MD   sodium chloride HYPERTONIC 3 % nebulizer solution 2 mL, 2 mL, Nebulization, BID, Dgayli, Khabib, MD, 2 mL at 02/06/22 0816   sucralfate (CARAFATE) 1 GM/10ML suspension 1 g, 1 g, Oral, Daily, Agbata, Tochukwu, MD, 1 g at 02/06/22 0904   SUMAtriptan (IMITREX) tablet 100 mg, 100 mg,  Oral, Q2H PRN, Dorothe Pea [facility]   Physical exam:  Vitals:   02/05/22 2030 02/06/22 0531 02/06/22 0725 02/06/22 0817  BP: 116/63 115/68 114/66   Pulse: (!) 102  88 85   Resp: 18 16 18    Temp: 98.4 F (36.9 C) 98.1 F (36.7 C) 98.2 F (36.8 C)   TempSrc:      SpO2: 94% 92% 91% 92%  Weight:      Height:       Physical Exam Constitutional:      General: She is not in acute distress. Cardiovascular:     Rate and Rhythm: Regular rhythm. Tachycardia present.     Heart sounds: Normal heart sounds.  Pulmonary:     Comments: Scattered bilateral rhonchi.  Effort of breathing increased Abdominal:     General: Bowel sounds are normal.     Palpations: Abdomen is soft.  Skin:    General: Skin is warm and dry.  Neurological:     Mental Status: She is alert and oriented to person, place, and time.           Latest Ref Rng & Units 02/06/2022    5:49 AM  CMP  Glucose 70 - 99 mg/dL 92   BUN 8 - 23 mg/dL 8   Creatinine 0.44 - 1.00 mg/dL 0.67   Sodium 135 - 145 mmol/L 140   Potassium 3.5 - 5.1 mmol/L 3.9   Chloride 98 - 111 mmol/L 107   CO2 22 - 32 mmol/L 25   Calcium 8.9 - 10.3 mg/dL 8.6       Latest Ref Rng & Units 02/06/2022    5:49 AM  CBC  WBC 4.0 - 10.5 K/uL 2.8   Hemoglobin 12.0 - 15.0 g/dL 9.5   Hematocrit 36.0 - 46.0 % 28.6   Platelets 150 - 400 K/uL 319     @IMAGES @  CT CHEST WO CONTRAST  Result Date: 02/05/2022 CLINICAL DATA:  Non-small cell lung cancer; completed chemoradiation May 2023; * Tracking Code: BO * EXAM: CT CHEST WITHOUT CONTRAST TECHNIQUE: Multidetector CT imaging of the chest was performed following the standard protocol without IV contrast. RADIATION DOSE REDUCTION: This exam was performed according to the departmental dose-optimization program which includes automated exposure control, adjustment of the mA and/or kV according to patient size and/or use of iterative reconstruction technique. COMPARISON:  Chest CT dated February 04, 2022  FINDINGS: Cardiovascular: Normal heart size. Small pericardial effusion, similar to prior exam. Mitral annular calcifications. Moderate coronary artery calcifications. Normal caliber thoracic aorta with moderate calcified plaque. Mediastinum/Nodes: Esophagus is unremarkable. No pathologically enlarged lymph nodes seen in the chest. Lungs/Pleura: Central airways are patent. Moderate centrilobular emphysema. Evolving right perihilar postradiation change with decreased ground-glass opacities and more linear confluent consolidations about the right hilum. Stable left perihilar postradiation change. Small right pleural effusion, decreased in size when compared with prior exam. New trace left pleural effusion. Stable solid right lower lobe pulmonary nodule measuring 4 mm on series 3, image 96. No new or enlarging pulmonary nodules Upper Abdomen: No acute abnormality. Musculoskeletal: No chest wall mass or suspicious bone lesions identified. IMPRESSION: 1. Evolving right perihilar postradiation and stable left perihilar postradiation change. No evidence of progressive metastatic disease in the chest. 2. Small right pleural effusion, decreased in size when compared with prior exam. 3. New trace left pleural effusion. 4. Aortic Atherosclerosis (ICD10-I70.0) and Emphysema (ICD10-J43.9). Electronically Signed   By: Yetta Glassman M.D.   On: 02/05/2022 12:57   DG Chest Port 1 View  Result Date: 02/04/2022 CLINICAL DATA:  Status post right thoracentesis. EXAM: PORTABLE CHEST 1 VIEW COMPARISON:  02/04/2022, earlier same day FINDINGS: 1843 hours. No evidence for right-sided pneumothorax  status post thoracentesis. There is persistent right base collapse/consolidation with probable residual right pleural effusion. Architectural distortion/scarring noted right mid lung. Minimal atelectasis at the left base is stable. The cardiopericardial silhouette is within normal limits for size. The visualized bony structures of the thorax  are unremarkable. Right Port-A-Cath again noted. IMPRESSION: 1. No evidence for right pneumothorax status post thoracentesis. 2. Persistent right base collapse/consolidation with probable residual right pleural effusion. Electronically Signed   By: Misty Stanley M.D.   On: 02/04/2022 18:56   CT Angio Chest PE W and/or Wo Contrast  Result Date: 02/04/2022 CLINICAL DATA:  A 76 year old female with suspected pulmonary embolism. Patient also with history of metastatic lung cancer. * Tracking Code: BO * EXAM: CT ANGIOGRAPHY CHEST WITH CONTRAST TECHNIQUE: Multidetector CT imaging of the chest was performed using the standard protocol during bolus administration of intravenous contrast. Multiplanar CT image reconstructions and MIPs were obtained to evaluate the vascular anatomy. RADIATION DOSE REDUCTION: This exam was performed according to the departmental dose-optimization program which includes automated exposure control, adjustment of the mA and/or kV according to patient size and/or use of iterative reconstruction technique. CONTRAST:  31mL OMNIPAQUE IOHEXOL 350 MG/ML SOLN COMPARISON:  CT of the chest abdomen and pelvis dated November 11, 2021. FINDINGS: Cardiovascular: RIGHT sided Port-A-Cath terminates at the caval to atrial junction. Heart size is normal. There is a small pericardial effusion without nodularity that is unchanged. Aortic atherosclerosis both calcified and noncalcified throughout the thoracic aorta. Aorta displays in normal caliber without gross abnormality on the study optimized for pulmonary arterial assessment. Main pulmonary artery is opacified to 416 Hounsfield units. The study is negative for pulmonary embolism. Mediastinum/Nodes: No signs of discrete adenopathy in the mediastinum. RIGHT hilar fullness is increased and is contiguous with consolidative changes in the RIGHT chest, see below. There is no discrete mass lesion or central adenopathy to explain airspace process in the RIGHT chest.  Post treatment changes about the LEFT hilum are similar to previous imaging. Esophagus is mildly patulous. Lungs/Pleura: Signs of RIGHT middle lobe consolidation that has developed since previous imaging where there was mainly ground-glass about the central RIGHT chest. Ground-glass and septal thickening seen throughout aerated portions of the RIGHT chest. Of note the patient had RIGHT mediastinal and hilar recurrence identified in early 2023 which was subsequently treated. Initial therapy occurred about the LEFT hilum which continues to show post treatment changes which are not changed compared to August of 2023. A small to moderate RIGHT-sided pleural effusion is partially loculated towards the RIGHT lung apex and is increased in volume also loculated along the anterior chest. Mild irregularity of the RIGHT mainstem bronchus with potential material within the lumen. No large volume material in the trachea. Slit like appearance of the LEFT mainstem bronchus. No LEFT-sided effusion. Upper Abdomen: Imaged portions the liver, pancreas, spleen, adrenal glands and kidneys are unremarkable. No acute gastrointestinal findings to the extent evaluated. No upper abdominal lymphadenopathy. Musculoskeletal: No acute bone finding. No destructive bone process. Spinal degenerative changes. Review of the MIP images confirms the above findings. IMPRESSION: 1. Negative for pulmonary embolism. 2. Signs of RIGHT middle lobe consolidation that has developed since previous imaging where there was mainly ground-glass about the central RIGHT chest. Findings also associated with increasing pleural fluid with loculated appearance in the LEFT chest but without signs of pleural enhancement at this time. Correlate with any symptoms of pneumonia. Findings could also reflect pneumonitis and should be correlated with any ongoing therapy. 3. Slit like  appearance of mainstem bronchi raising the question of bronchomalacia. 4. Material in RIGHT lower  lobe bronchi may represent aspirated or inspissated secretions. Correlate with any risk factors for or history of aspiration. There is no large volume material in the trachea or LEFT mainstem bronchus. 5. Stable appearance of post treatment changes about the LEFT hilum. Aortic Atherosclerosis (ICD10-I70.0). Electronically Signed   By: Zetta Bills M.D.   On: 02/04/2022 15:26   DG Chest 2 View  Result Date: 02/04/2022 CLINICAL DATA:  Shortness of breath. EXAM: CHEST - 2 VIEW COMPARISON:  CT examination dated November 11, 2021 FINDINGS: The heart size and mediastinal contours are within normal limits. Scarring with bronchiectasis in the right lower lobe with volume loss and elevation of the right hemidiaphragm, unchanged. Small right pleural effusion. There also mild atelectasis/fibrotic changes at the left lung base. Right IJ access MediPort with distal tip in the SVC. Anterior cervical discectomy and fusion hardware. IMPRESSION: 1. Scarring with bronchiectasis in the right lower lobe with volume loss and elevation of the right hemidiaphragm, unchanged. This may represent postsurgical changes/treatment response. 2. Small right pleural effusion, underlying infectious/inflammatory process can not be excluded. Electronically Signed   By: Keane Police D.O.   On: 02/04/2022 10:47   MR Brain W Wo Contrast  Result Date: 01/21/2022 CLINICAL DATA:  Lung carcinoma.  Brain metastases suspected. EXAM: MRI HEAD WITHOUT AND WITH CONTRAST TECHNIQUE: Multiplanar, multiecho pulse sequences of the brain and surrounding structures were obtained without and with intravenous contrast. CONTRAST:  83mL GADAVIST GADOBUTROL 1 MMOL/ML IV SOLN COMPARISON:  Brain MRI 06/02/2021 FINDINGS: Brain: No acute infarct, mass effect or extra-axial collection. No acute or chronic hemorrhage. Normal white matter signal, parenchymal volume and CSF spaces. The midline structures are normal. There is no abnormal contrast enhancement. Vascular: Major  flow voids are preserved. Skull and upper cervical spine: Normal calvarium and skull base. Visualized upper cervical spine and soft tissues are normal. Sinuses/Orbits:No paranasal sinus fluid levels or advanced mucosal thickening. No mastoid or middle ear effusion. Normal orbits. IMPRESSION: Normal brain MRI. No intracranial metastatic disease. Electronically Signed   By: Ulyses Jarred M.D.   On: 01/21/2022 14:23    Assessment and plan- Patient is a 76 y.o. female with history of recurrent stage III lung cancer currently on maintenance Alimta admitted for acute hypoxic respiratory failure secondary to healthcare associated pneumonia  Healthcare associated pneumonia: Currently on Zosyn and azithromycin.  Being followed by pulmonary.  No evidence of PE.  Clinically making slow improvements.  She was noted to have a loculated left pleural effusion which was drained 2 days ago.  Unclear if this is related to malignancy or not.  Cytology is currently pending.  If cytology is positive for malignancy, outpatient chemotherapy management will be changed accordingly.  However if her effusion is secondary to underlying pneumonia no change in management from an oncology standpoint.  I will follow-up with the patient closely upon discharge.  Visit Diagnosis 1. Thrush of mouth and esophagus ([place])   2. Community acquired pneumonia, unspecified laterality     Dr. Randa Evens, MD, MPH [facility] [phone] 02/06/2022

## 2022-02-06 NOTE — Assessment & Plan Note (Signed)
Controlled for now

## 2022-02-06 NOTE — Evaluation (Signed)
Physical Therapy Evaluation Patient Details Name: Jennifer Hodges MRN: [id number] DOB: 1945/05/20 Today's Date: 02/06/2022  History of Present Illness  Pt is a 76 y/o F admitted on 02/04/22 after presenting with c/o worsening SOB & was found to have R sided pleural effusion. Pt is s/p thoracentesis on 11/24. 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 Pt reports prior to admission she was mod I with rollator, living with her husband in a 1 level home with 2 steps to enter. On this date, pt is mod I for bed mobility, independent for STS, and ambulates 1 lap around nurses station with RW & supervision with ~2 standing rest breaks 2/2 SOB. PT provides education re: pursed lip breathing & safety with mobility as pt multiple times during session would push RW to the side & attempt to ambulate without ensuring IV pole was not going to pull out. Pt reports she typically moves fast.   Pt on room air throughout session with SpO2 reading as low as 86% during gait but able to increase to 88%. SpO2 89% on room air semi fowler in bed and end of session -- nurse notified & reports he will come check on pt.      Recommendations for follow up therapy are one component of a multi-disciplinary discharge planning process, led by the attending physician.  Recommendations may be updated based on patient status, additional functional criteria and insurance authorization.  Follow Up Recommendations No PT follow up      Assistance Recommended at Discharge PRN  Patient can return home with the following  Help with stairs or ramp for entrance;Assistance with cooking/housework    Equipment Recommendations None recommended by PT  Recommendations for Other Services       Functional Status Assessment Patient has had a recent decline in their functional  status and demonstrates the ability to make significant improvements in function in a reasonable and predictable amount of time.     Precautions / Restrictions Precautions Precautions: None Restrictions Weight Bearing Restrictions: No      Mobility  Bed Mobility Overal bed mobility: Modified Independent             General bed mobility comments: supine<>sit with HOB elevated    Transfers Overall transfer level: Independent Equipment used: None                    Ambulation/Gait Ambulation/Gait assistance: Supervision Gait Distance (Feet): 180 Feet Assistive device: Rolling walker (2 wheels)   Gait velocity: WNL     General Gait Details: ~2 standing rest breaks 2/2 SOB  Stairs Stairs:  (declines practicing on this date)          Wheelchair Mobility    Modified Rankin (Stroke Patients Only)       Balance Overall balance assessment: Mild deficits observed, not formally tested Sitting-balance support: No upper extremity supported, Feet supported Sitting balance-Leahy Scale: Good     Standing balance support: No upper extremity supported, During functional activity Standing balance-Leahy Scale: Good                               Pertinent Vitals/Pain Pain Assessment Pain Assessment: No/denies pain    Home Living Family/patient expects to be discharged to:: Private residence Living Arrangements: Spouse/significant  other Available Help at Discharge: Family Type of Home: House Home Access: Stairs to enter Entrance Stairs-Rails:  (1 rail) Technical brewer of Steps: 2   Home Layout: One level Home Equipment: Rollator (4 wheels)      Prior Function Prior Level of Function : Independent/Modified Independent             Mobility Comments: Pt reports mod I with rollator.       Hand Dominance        Extremity/Trunk Assessment   Upper Extremity Assessment Upper Extremity Assessment: Overall WFL for tasks  assessed    Lower Extremity Assessment Lower Extremity Assessment: Overall WFL for tasks assessed       Communication   Communication: No difficulties  Cognition Arousal/Alertness: Awake/alert Behavior During Therapy: WFL for tasks assessed/performed, Impulsive Overall Cognitive Status: Within Functional Limits for tasks assessed                                 General Comments: Pt is impulsive with all mobility despite PT attempts to educate her.        General Comments      Exercises     Assessment/Plan    PT Assessment Patient needs continued PT services  PT Problem List Cardiopulmonary status limiting activity;Decreased activity tolerance;Decreased safety awareness       PT Treatment Interventions DME instruction;Therapeutic exercise;Gait training;Balance training;Stair training;Neuromuscular re-education;Functional mobility training;Therapeutic activities;Patient/family education    PT Goals (Current goals can be found in the Care Plan section)  Acute Rehab PT Goals Patient Stated Goal: to rest PT Goal Formulation: With patient Time For Goal Achievement: 02/20/22 Potential to Achieve Goals: Good    Frequency Min 2X/week     Co-evaluation               AM-PAC PT "6 Clicks" Mobility  Outcome Measure Help needed turning from your back to your side while in a flat bed without using bedrails?: None Help needed moving from lying on your back to sitting on the side of a flat bed without using bedrails?: None Help needed moving to and from a bed to a chair (including a wheelchair)?: None Help needed standing up from a chair using your arms (e.g., wheelchair or bedside chair)?: None Help needed to walk in hospital room?: A Little Help needed climbing 3-5 steps with a railing? : A Little 6 Click Score: 22    End of Session   Activity Tolerance: Patient tolerated treatment well Patient left: with call bell/phone within reach;with bed alarm  set;in bed Nurse Communication: Mobility status (O2) PT Visit Diagnosis: Muscle weakness (generalized) (M62.81)    Time: 0947-0962 PT Time Calculation (min) (ACUTE ONLY): 9 min   Charges:   PT Evaluation $PT Eval Low Complexity: [place], PT, DPT 02/06/22, 2:14 PM   Waunita Schooner 02/06/2022, 2:12 PM

## 2022-02-07 DIAGNOSIS — J690 Pneumonitis due to inhalation of food and vomit: Secondary | ICD-10-CM | POA: Diagnosis not present

## 2022-02-07 DIAGNOSIS — I10 Essential (primary) hypertension: Secondary | ICD-10-CM | POA: Diagnosis not present

## 2022-02-07 DIAGNOSIS — J90 Pleural effusion, not elsewhere classified: Secondary | ICD-10-CM | POA: Diagnosis not present

## 2022-02-07 DIAGNOSIS — C3412 Malignant neoplasm of upper lobe, left bronchus or lung: Secondary | ICD-10-CM | POA: Diagnosis not present

## 2022-02-07 LAB — GLUCOSE, CAPILLARY
Glucose-Capillary: 112 mg/dL — ABNORMAL HIGH (ref 70–99)
Glucose-Capillary: 92 mg/dL (ref 70–99)

## 2022-02-07 MED ORDER — PREDNISONE 20 MG PO TABS: 40.0000 mg | ORAL_TABLET | Freq: Every day | ORAL | Status: DC

## 2022-02-07 MED ORDER — PREDNISONE 20 MG PO TABS: 30.0000 mg | ORAL_TABLET | Freq: Every day | ORAL | Status: DC

## 2022-02-07 MED ORDER — ALBUTEROL SULFATE (2.5 MG/3ML) 0.083% IN NEBU
2.5000 mg | INHALATION_SOLUTION | Freq: Four times a day (QID) | RESPIRATORY_TRACT | Status: DC
  Administered 2022-02-07 – 2022-02-09 (×6): 2.5 mg via RESPIRATORY_TRACT
  Filled 2022-02-07 (×6): qty 3

## 2022-02-07 MED ORDER — PREDNISONE 10 MG PO TABS: 10.0000 mg | ORAL_TABLET | Freq: Every day | ORAL | Status: DC

## 2022-02-07 MED ORDER — PREDNISONE 50 MG PO TABS
50.0000 mg | ORAL_TABLET | Freq: Every day | ORAL | Status: DC
  Administered 2022-02-08 – 2022-02-09 (×2): 50 mg via ORAL
  Filled 2022-02-07 (×2): qty 1

## 2022-02-07 MED ORDER — LEVOFLOXACIN 750 MG PO TABS
750.0000 mg | ORAL_TABLET | Freq: Every day | ORAL | Status: DC
  Administered 2022-02-07 – 2022-02-09 (×3): 750 mg via ORAL
  Filled 2022-02-07 (×3): qty 1

## 2022-02-07 MED ORDER — PREDNISONE 20 MG PO TABS: 20.0000 mg | ORAL_TABLET | Freq: Every day | ORAL | Status: DC

## 2022-02-07 NOTE — Progress Notes (Signed)
  Progress Note   Patient: Jennifer Hodges YIA:[id number] DOB: 06-21-1945 DOA: 02/04/2022     3 DOS: the patient was seen and examined on 02/07/2022   Brief hospital course: 76 y.o. 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  Status post thoracentesis 11/25: Repeat CT chest looks stable or improved from before 11/26: Pending fluid results.  PT eval, still coughing 11/27: Chest PT, Levaquin and prednisone started per pulmonary as patient still quite symptomatic  Assessment and Plan: * Malignant neoplasm of upper lobe of left lung [facility]) Patient with a history of recurrent adenocarcinoma of the left lung currently on chemotherapy. Has completed radiation therapy Follow-up with oncology Dr. Janese Banks as an outpatient  Aspiration pneumonia [facility]) Patient presents to the ER for evaluation of right-sided pleuritic chest pain, cough productive of blood-tinged phlegm, worsening shortness of breath associated with fever and chills. 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  Hold meds with liquid as able Seen by pulmonary s/p thoracentesis with pending cytology.  Repeat CT chest on 11/25 shows stability or improvement from before Initially treated with Zosyn and Zithromax and now switched to oral Levaquin Negative blood cultures till now Patient still coughing -started prednisone, chest PT and Levaquin today by pulmonary  Pleural effusion on right Status post  thoracentesis on 11/24 by pulmonary.  Fluid studies sent.  Cytology results pending.  oncology following Repeat CT chest on 11/25 shows improvement/stable findings from before. Pulmonary seen -chest PT, Levaquin, prednisone started  Benign essential hypertension Controlled for now  Sleep apnea Continue CPAP at bedtime.         Subjective: Still coughing and shortness of breath remains.  Husband at bedside.  Became hypoxic in 80s when ambulated yesterday  Physical Exam: Vitals:   02/07/22 0716 02/07/22 0738 02/07/22 1314 02/07/22 1503  BP:  126/65  120/61  Pulse:  90  94  Resp:  20  16  Temp:  97.8 F (36.6 C)  97.6 F (36.4 C)  TempSrc:    Oral  SpO2: 94% 92% 95% 91%  Weight:      Height:       76 year old female lying in the bed comfortably without any acute distress Lungs Rhonchi at the right lung base, expiratory wheezing throughout both lungs Cardiovascular regular rate and rhythm Abdomen soft, benign Neuro alert and awake, nonfocal Skin no rash or lesion Data Reviewed:  There are no new results to review at this time.  Family Communication: Husband updated at bedside  Disposition: Status is: Inpatient Remains inpatient appropriate because: Pending pleural fluid cytology, still quite symptomatic with cough and shortness of breath  Planned Discharge Destination: Home with Home Health   DVT prophylaxis-Lovenox Time spent: 35 minutes  Author: Max Sane, MD 02/07/2022 4:38 PM  For on call review [email or web address].

## 2022-02-07 NOTE — Assessment & Plan Note (Signed)
Status post thoracentesis on 11/24 by pulmonary.  Fluid studies sent.  Cytology results pending.  oncology following Repeat CT chest on 11/25 shows improvement/stable findings from before. Pulmonary seen -chest PT, Levaquin, prednisone started

## 2022-02-07 NOTE — Assessment & Plan Note (Signed)
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 Has completed radiation therapy Follow-up with oncology Dr. Janese Banks as an outpatient

## 2022-02-07 NOTE — Progress Notes (Signed)
Physical Therapy Treatment Patient Details Name: Jennifer Hodges MRN: [id number] DOB: April 09, 1945 Today's Date: 02/07/2022   History of Present Illness Pt is a 76 y/o F admitted on 02/04/22 after presenting with c/o worsening SOB & was found to have R sided pleural effusion. Pt is s/p thoracentesis on 11/24. 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 Patient on RA throughout activity with spO2 93-97%. Ambulated in hallway with supervision and RW but required 2-3 standing rest breaks due to SOB. Performed standing exercises after seated rest break post-ambulation. Eager to participate and get stronger. No PT follow up recommended at this time.     Recommendations for follow up therapy are one component of a multi-disciplinary discharge planning process, led by the attending physician.  Recommendations may be updated based on patient status, additional functional criteria and insurance authorization.  Follow Up Recommendations  No PT follow up     Assistance Recommended at Discharge PRN  Patient can return home with the following Help with stairs or ramp for entrance;Assistance with cooking/housework   Equipment Recommendations  None recommended by PT    Recommendations for Other Services       Precautions / Restrictions Precautions Precautions: None Restrictions Weight Bearing Restrictions: No     Mobility  Bed Mobility Overal bed mobility: Modified Independent                  Transfers Overall transfer level: Modified independent Equipment used: None                    Ambulation/Gait Ambulation/Gait assistance: Supervision Gait Distance (Feet): 180 Feet Assistive device: Rolling Zanaria Morell (2 wheels) Gait Pattern/deviations: Step-through pattern, Decreased stride length Gait velocity:  decreased     General Gait Details: 2-3 standing rest breaks 2/2 SOB. Supervision for safety   Stairs             Wheelchair Mobility    Modified Rankin (Stroke Patients Only)       Balance Overall balance assessment: Mild deficits observed, not formally tested                                          Cognition Arousal/Alertness: Awake/alert Behavior During Therapy: WFL for tasks assessed/performed, Impulsive Overall Cognitive Status: Within Functional Limits for tasks assessed                                          Exercises General Exercises - Lower Extremity Hip ABduction/ADduction: Both, 10 reps, Standing Hip Flexion/Marching: Both, 10 reps, Standing Mini-Sqauts: Both, 10 reps, Standing    General Comments General comments (skin integrity, edema, etc.): on RA, spO2 93-97% throughout activity      Pertinent Vitals/Pain Pain Assessment Pain Assessment: No/denies pain    Home Living                          Prior Function            PT Goals (current goals can now be found in the care plan section) Acute Rehab PT Goals PT Goal Formulation: With patient  Time For Goal Achievement: 02/20/22 Potential to Achieve Goals: Good Progress towards PT goals: Progressing toward goals    Frequency    Min 2X/week      PT Plan Current plan remains appropriate    Co-evaluation              AM-PAC PT "6 Clicks" Mobility   Outcome Measure  Help needed turning from your back to your side while in a flat bed without using bedrails?: None Help needed moving from lying on your back to sitting on the side of a flat bed without using bedrails?: None Help needed moving to and from a bed to a chair (including a wheelchair)?: None Help needed standing up from a chair using your arms (e.g., wheelchair or bedside chair)?: None Help needed to walk in hospital room?: A Little Help needed climbing 3-5 steps with a  railing? : A Little 6 Click Score: 22    End of Session   Activity Tolerance: Patient tolerated treatment well Patient left: in bed;with call bell/phone within reach Nurse Communication: Mobility status PT Visit Diagnosis: Muscle weakness (generalized) (M62.81)     Time: 1126-1140 PT Time Calculation (min) (ACUTE ONLY): 14 min  Charges:  $Therapeutic Activity: 8-22 mins                     Nyimah Shadduck A. Gilford Rile PT, DPT [facility] - Acute Rehabilitation Services    Jasimine Simms A Tayquan Gassman 02/07/2022, 12:57 PM

## 2022-02-07 NOTE — Progress Notes (Signed)
NAME:  Jennifer Hodges, MRN:  [id number], DOB:  02/05/1946, LOS: 3 ADMISSION DATE:  02/04/2022, CONSULTATION DATE:  02/04/2022 REFERRING MD: Collier Bullock, MD, CHIEF COMPLAINT:  shortness of breath   History of Present Illness:   Jennifer Hodges is a 76 year old female with a past medical history of stage III adenocarcinoma of the lung status post chemoradiation (finished radiation in the spring 2023) who presents to the hospital with shortness of breath.  Patient reports that she has been having shortness of breath for the past couple of months that was initially with exertion and slowly progressed to shortness of breath with rest.  Over the past 7 days, she has had further progression in her symptoms whereby she has been extremely winded at rest.  This was associated with a cough that is productive of sputum and report of chills at home.  She did not measure her temperature but thinks she might of had a fever.  The progression of her symptoms prompted her to present to the ED for an evaluation.  She does not have any chest pain or chest tightness, and denies any hemoptysis.  She does not have any GI or GU symptoms.  She has not had any weakness.  She reports subjective fevers and chills but has not measured her temperature.  In the ED, white count was within normal as were her hemodynamics.  She is afebrile in the ED.  Blood cultures were drawn and sent and she was given broad-spectrum antibiotics.  CT scan of the chest with PE protocol was performed and was negative for pulmonary embolism.  Right middle lobe consolidation was noted to be new as compared to prior imaging and there was an associated RIGHT sided pleural effusion, with possible loculation anteriorly. The CT was also notable for material in the right lower lobe bronchi concerning for aspirated/inspissated secretions.  Pulmonology was consulted to aid in the management of the patient's symptoms.  Physical Exam    Pertinent  Medical  History  Her past medical history is most notable for stage III adenocarcinoma of the lung diagnosed in September 2020 for which she is followed by Dr. Janese Banks.  At that time, the tumor was expressing PD-L1 at 50%.  She underwent chemoradiation in December 2020 and initiated on maintenance durvalumab in January 2021.  She was found to have enlarging mediastinal adenopathy in January 2023 biopsied and noted for adenocarcinoma of the lung as well.  She underwent 4 cycles of chemoradiation ending in May 2023 and continues to follow closely with oncology.  She was last seen in oncology clinic in November 2023 and continued on maintenance pemetrexed.  She is also followed by [facility] pulmonary (Dr. [name]) for postradiation pneumonitis and recurrent episodes of bronchitis.  Significant Hospital Events: Including procedures, antibiotic start and stop dates in addition to other pertinent events   02/04/2022: admitted, thoracentesis performed   Objective   Blood pressure 126/65, pulse 90, temperature 97.8 F (36.6 C), resp. rate 20, height _0  (1.549 m), weight 79.4 kg, SpO2 92 %.       No intake or output data in the 24 hours ending 02/07/22 1252 Filed Weights   02/04/22 1026 02/04/22 2106  Weight: 79.2 kg 79.4 kg    Examination:   Assessment & Plan:   Jennifer Hodges is a 76 year old female with a history of stage III lung adenocarcinoma presenting to the hospital with shortness of breath concerning for pneumonia with CT imaging notable for a right-sided pleural effusion with  signs of loculation.  # Acute hypoxic respiratory failure # Stage III non-small cell lung cancer # Right-sided pleural effusion # Right middle lobe consolidation # History of radiation pneumonitis  The patient is presenting with acute hypoxic respiratory failure secondary to combination of pneumonia and right-sided pleural effusion on the backdrop of radiation pneumonitis and stage III non-small cell lung cancer.  While  procalcitonin was negative, this does not preclude her from having a pneumonia given she is immunosuppressed with chemotherapy and has structural lung disease secondary to the cancer and radiation.  I agree with continuation of broad-spectrum antibiotics and would recommend obtaining respiratory cultures in addition to Legionella and strep antigens in the urine.  Would recommend Legionella coverage with azithromycin in addition to the broad-spectrum antibiotic to cover for pneumonia.  The presentation of the consolidation on the CT scan is not consistent with chemotherapy associated pneumotoxicity given its focality.  Should the patient not improve with a course of antibiotics and pleural drainage, she would be a candidate for flexible bronchoscopy for airway evaluation to rule out an endobronchial lesion, mass, or mucous plug.  The pleural effusion is simple appearing on lung ultrasound with no signs of septation or pleural thickening.  The differential includes a malignant pleural effusion versus a parapneumonic effusion in the setting of pneumonia.  She will require needle thoracostomy for drainage performed by me at the bedside today.  The fluid was sent for cultures, analysis, cytology, and flow cytometry.  I would recommend a repeat CT scan of the chest following thoracentesis to evaluate for any loculation.  Should there be remaining fluid loculation anteriorly, would recommend a consult to interventional radiology for drainage of said anterior effusion to rule out infection (Patient placed NPO after midnight).  Finally, would recommend aggressive pulmonary toilet with flutter device, incentive spirometry, chest physiotherapy, and hypertonic saline followed by DuoNebs. Would switch to standing duonebs for management of her COPD while inpatient.  Recommendations:  -thoracentesis at the bedside today -repeat Chest CT w/o contrast -legionella coverage, check strep and legionella urine antigen -check  MRSA PCR, d/c vanc if negative -duonebs standing, duonebs PRN, hypertonic saline -chest physiotherapy, pulmonary toilet -continue broad spectrum antibiotics -follow up pleural fluid analysis, cytology and culture  Labs   CBC: Recent Labs  Lab 02/04/22 1028 02/05/22 0909 02/06/22 0549  WBC 4.9 4.0 2.8*  HGB 10.2* 9.6* 9.5*  HCT 31.9* 29.6* 28.6*  MCV 95.2 94.0 94.1  PLT 295 312 319     Basic Metabolic Panel: Recent Labs  Lab 02/04/22 1028 02/05/22 0909 02/06/22 0549  NA 140 145 140  K 4.2 4.4 3.9  CL 104 111 107  CO2 _0 GLUCOSE 106* 104* 92  BUN _1 CREATININE 0.57 0.61 0.67  CALCIUM 9.4 9.0 8.6*    GFR: Estimated Creatinine Clearance: 56.2 mL/min (by C-G formula based on SCr of 0.67 mg/dL). Recent Labs  Lab 02/04/22 1028 02/05/22 0909 02/06/22 0549  PROCALCITON <0.10 <0.10 <0.10  WBC 4.9 4.0 2.8*     Liver Function Tests: Recent Labs  Lab 02/04/22 1028 02/04/22 1753  AST 23  --   ALT 14  --   ALKPHOS 82  --   BILITOT 0.6  --   PROT 6.7 4.0*  ALBUMIN 3.7 2.9*    No results for input(s): "LIPASE", "AMYLASE" in the last 168 hours. No results for input(s): "AMMONIA" in the last 168 hours.  ABG No results found for: "PHART", "PCO2ART", "PO2ART", "HCO3", "TCO2", "  ACIDBASEDEF", "O2SAT"   Coagulation Profile: No results for input(s): "INR", "PROTIME" in the last 168 hours.  Cardiac Enzymes: No results for input(s): "CKTOTAL", "CKMB", "CKMBINDEX", "TROPONINI" in the last 168 hours.  HbA1C: Hgb A1c MFr Bld  Date/Time Value Ref Range Status  02/18/2015 01:46 PM 5.7 (H) 4.8 - 5.6 % Final    Comment:    (NOTE)         Pre-diabetes: 5.7 - 6.4         Diabetes: >6.4         Glycemic control for adults with diabetes: <7.0   11/11/2013 11:15 AM 5.7 4.6 - 6.5 % Final    Comment:    Glycemic Control Guidelines for People with Diabetes:Non Diabetic:  <6%Goal of Therapy: <7%Additional Action Suggested:  >8%     CBG: Recent Labs  Lab  02/06/22 1131 02/06/22 1555 02/06/22 2009 02/07/22 0736 02/07/22 1127  GLUCAP 99 111* 124* 112* 92    Review of Systems:   As per HPI  Past Medical History:  She,  has a past medical history of Anemia, Asthma, Bronchiectasis ([place]), Chronic headaches, COPD (chronic obstructive pulmonary disease) ([place]), DDD (degenerative disc disease), lumbar, Diverticulosis, Family history of adverse reaction to anesthesia, Gall stones, GERD (gastroesophageal reflux disease), Hiatal hernia, History of kidney stones, History of pneumonia, Hypercholesterolemia, IBS (irritable bowel syndrome), Malignant neoplasm of upper lobe of left lung ([place]) (12/27/2018), Meniere disease, Murmur, Osteoarthritis, Personal history of chemotherapy, Personal history of radiation therapy, Pneumonia (12/2020), PONV (postoperative nausea and vomiting), Sleep apnea, Vertigo, and Weakness of right side of body.   Surgical History:   Past Surgical History:  Procedure Laterality Date   ABDOMINAL HYSTERECTOMY  age 12   ANTERIOR CERVICAL DECOMP/DISCECTOMY FUSION N/A 02/24/2015   Procedure: CERVICAL FOUR-FIVE, CERVICAL FIVE-SIX, CERVICAL SIX-SEVEN ANTERIOR CERVICAL DECOMPRESSION/DISCECTOMY FUSION ;  Surgeon: [name], MD;  Location: [place] [facility];  Service: Neurosurgery;  Laterality: N/A;  C45 C56 C67 anterior cervical decompression with fusion interbody prosthesis plating and bonegraft   APPENDECTOMY     BACK SURGERY  1988   4th lumbar fusion   BLADDER SURGERY N/A    with vaginal wall repair   BREAST CYST ASPIRATION Bilateral    neg   BREAST SURGERY Bilateral    cyst removed and reduction   CARDIAC CATHETERIZATION  2014   CARPAL TUNNEL RELEASE Right 02/11/2016   Procedure: CARPAL TUNNEL RELEASE;  Surgeon: [name], MD;  Location: [facility] OR[facility];  Service: Orthopedics;  Laterality: Right;   CATARACT EXTRACTION W/ INTRAOCULAR LENS IMPLANT Bilateral 2015   CHOLECYSTECTOMY     EUS N/A 05/31/2012   Procedure: UPPER ENDOSCOPIC  ULTRASOUND (EUS) LINEAR;  Surgeon: [name], MD;  Location: [facility];  Service: Endoscopy;  Laterality: N/A;   EXCISIONAL HEMORRHOIDECTOMY     JOINT REPLACEMENT Bilateral    KNEE ARTHROSCOPY WITH LATERAL MENISECTOMY Right 07/07/2015   Procedure: KNEE ARTHROSCOPY WITH LATERAL MENISECTOMY, PARTIAL SYNOVECTOMY;  Surgeon: [name], MD;  Location: [facility];  Service: Orthopedics;  Laterality: Right;   LUMBAR LAMINECTOMY     PORTA CATH INSERTION N/A 01/07/2019   Procedure: PORTA CATH INSERTION;  Surgeon: [name], MD;  Location: [place] [facility];  Service: Cardiovascular;  Laterality: N/A;   REDUCTION MAMMAPLASTY  1990   RIGHT OOPHORECTOMY     TOTAL HIP ARTHROPLASTY Left 05/01/2014   Dr. [name]   TOTAL HIP ARTHROPLASTY Right 08/05/2014   Procedure: TOTAL HIP ARTHROPLASTY ANTERIOR APPROACH;  Surgeon: [name], MD;  Location: [facility];  Service: Orthopedics;  Laterality: Right;   ULNAR NERVE TRANSPOSITION Right 02/11/2016   Procedure: ULNAR NERVE DECOMPRESSION/TRANSPOSITION;  Surgeon: [name], MD;  Location: [facility];  Service: Orthopedics;  Laterality: Right;   VIDEO BRONCHOSCOPY WITH ENDOBRONCHIAL ULTRASOUND Left 12/19/2018   Procedure: VIDEO BRONCHOSCOPY WITH ENDOBRONCHIAL ULTRASOUND, LEFT, SLEEP APNEA;  Surgeon: [name], MD;  Location: [facility] ORS;  Service: Thoracic;  Laterality: Left;   VIDEO BRONCHOSCOPY WITH ENDOBRONCHIAL ULTRASOUND Right 05/14/2021   Procedure: VIDEO BRONCHOSCOPY WITH ENDOBRONCHIAL ULTRASOUND;  Surgeon: Tyler Pita, MD;  Location: [facility];  Service: Pulmonary;  Laterality: Right;     Social History:   reports that she quit smoking about 14 years ago. Her smoking use included cigarettes. She has a 40.00 pack-year smoking history. She has never used smokeless tobacco. She reports that she does not drink alcohol and does not use drugs.   Family History:  Her family history includes Breast cancer in her cousin and paternal  aunt; Diabetes in her father; Heart disease in her mother; Hypercholesterolemia in her mother; Hypertension in her mother; Stomach cancer in an other family member.   Allergies Allergies  Allergen Reactions   Aspirin Hives, Shortness Of Breath and Other (See Comments)    Difficulty breathing   Celebrex [Celecoxib] Shortness Of Breath   Morphine And Related Nausea And Vomiting and Swelling   Adhesive [Tape] Other (See Comments)    Took top layer of skin off when removed.   Clarithromycin Nausea And Vomiting   Codeine Nausea And Vomiting   Darvon [Propoxyphene Hcl] Nausea And Vomiting   Demerol [Meperidine] Nausea And Vomiting   Flonase [Fluticasone Propionate] Other (See Comments)    Fungal infection in nose   Simvastatin Other (See Comments)    "caused ulcers in mouth, and fever"   Talwin [Pentazocine] Nausea And Vomiting     Home Medications  Prior to Admission medications   Medication Sig Start Date End Date Taking? Authorizing Provider  fluconazole (DIFLUCAN) 100 MG tablet Take 1 tablet (100 mg total) by mouth daily for 7 days. 02/04/22 02/11/22 Yes Delman Kitten, MD  acetaminophen (TYLENOL) 500 MG tablet Take 500 mg by mouth 2 (two) times daily.    [provider]  albuterol (VENTOLIN HFA) 108 (90 Base) MCG/ACT inhaler Inhale 2 puffs into the lungs every 6 (six) hours as needed for wheezing or shortness of breath.    [provider]  Bioflavonoid Products (ESTER C PO) Take 1 tablet by mouth 3 (three) times daily. 500 mg    [provider]  Calcium-Magnesium-Zinc (CAL-MAG-ZINC PO) Take 1 tablet by mouth daily.    [provider]  carboxymethylcellulose (REFRESH PLUS) 0.5 % SOLN Place 1 drop into both eyes 3 (three) times daily as needed (dry eyes).    [provider]  cetirizine (ZYRTEC) 5 MG tablet Take 1 tablet (5 mg total) by mouth daily. Patient not taking: Reported on 12/29/2021 04/17/18   Coral Spikes, DO  chlorhexidine (PERIDEX) 0.12  % solution SMARTSIG:By Mouth Patient not taking: Reported on 11/17/2021 09/28/21   [provider]  Cholecalciferol (EQL VITAMIN D3) 25 MCG (1000 UT) capsule Take 1,000 Units by mouth daily.    [provider]  Coenzyme Q10 (CO Q 10) 100 MG CAPS Take 100 mg by mouth daily.     [provider]  Cyanocobalamin (VITAMIN B-12) 2500 MCG SUBL Place 2,500 mcg under the tongue daily.    [provider]  cyclobenzaprine (FLEXERIL) 10  MG tablet Take 10 mg by mouth at bedtime.    [provider]  DHEA 25 MG CAPS Take 25 mg by mouth daily.    [provider]  ezetimibe (ZETIA) 10 MG tablet Take 5 mg by mouth in the morning and at bedtime. 10/26/18   [provider]  folic acid (FOLVITE) 300 MCG tablet Take 400 mcg by mouth daily.    [provider]  gabapentin (NEURONTIN) 100 MG capsule Take 100 mg by mouth 3 (three) times daily. Patient not taking: Reported on 10/06/2021    [provider]  Garlic 7622 MG CAPS Take 1,000 mg by mouth daily. Patient not taking: Reported on 12/29/2021    [provider]  Histamine Dihydrochloride (AUSTRALIAN DREAM ARTHRITIS) 0.025 % CREA Apply 1 application. topically 4 (four) times daily as needed (pain).    [provider]  HYDROcodone-acetaminophen (NORCO/VICODIN) 5-325 MG tablet 1-2 tabs po bid prn Patient not taking: Reported on 11/17/2021 12/09/18   Norval Gable, MD  levOCARNitine (L-CARNITINE) 250 MG TABS Take 1 tablet by mouth daily. With Chromium 250 mg    [provider]  meclizine (ANTIVERT) 25 MG tablet Take 1 tablet (25 mg total) by mouth every 6 (six) hours as needed for dizziness. Patient not taking: Reported on 11/17/2021 06/11/19   Sindy Guadeloupe, MD  ondansetron (ZOFRAN) 8 MG tablet Take by mouth every 8 (eight) hours as needed for nausea or vomiting.    [provider]  pantoprazole (PROTONIX) 40 MG tablet Take 40 mg by mouth 2 (two) times daily.     [provider]  polyethylene glycol powder (GLYCOLAX/MIRALAX) 17 GM/SCOOP powder Take 17 g by mouth daily as needed for moderate constipation.    [provider]  pyridOXINE (VITAMIN B-6) 100 MG tablet Take 100 mg by mouth daily.    [provider]  Selenium 200 MCG CAPS Take 200 mcg by mouth daily.    [provider]  Sennosides (SENNA) 8.6 MG CAPS Take 1 capsule by mouth daily as needed (constipation).    [provider]  sucralfate (CARAFATE) 1 GM/10ML suspension Take 1 g by mouth daily. 02/13/19   [provider]  Tiotropium Bromide-Olodaterol (STIOLTO RESPIMAT) 2.5-2.5 MCG/ACT AERS Inhale 2 puffs into the lungs daily. 06/07/21   Tyler Pita, MD  tobramycin-dexamethasone [facility]) ophthalmic solution SMARTSIG:1 Drop(s) In Eye(s) 2-3 Times Daily PRN 08/26/21   [provider]  triamcinolone (NASACORT) 55 MCG/ACT AERO nasal inhaler Place 2 sprays into the nose daily.    [provider]  UBRELVY 100 MG TABS Take 100 mg by mouth daily as needed (migraines). 01/30/20   [provider]  Vitamin A 2400 MCG (8000 UT) CAPS Take 8,000 Units by mouth daily.    [provider]  Vitamin E 200 units TABS Take 200 Units by mouth daily.    [provider]  Wheat Dextrin (EQ FIBER POWDER PO) Take by mouth.    [provider]     Total care time: 73 minutes    [name], MD [facility]

## 2022-02-07 NOTE — Assessment & Plan Note (Signed)
Patient presents to the ER for evaluation of right-sided pleuritic chest pain, cough productive of blood-tinged phlegm, worsening shortness of breath associated with fever and chills. 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  Hold meds with liquid as able Seen by pulmonary s/p thoracentesis with pending cytology.  Repeat CT chest on 11/25 shows stability or improvement from before Initially treated with Zosyn and Zithromax and now switched to oral Levaquin Negative blood cultures till now Patient still coughing -started prednisone, chest PT and Levaquin today by pulmonary

## 2022-02-07 NOTE — Telephone Encounter (Signed)
Can we send O2 to her or do we need to bring her into our clinic to document her sats?

## 2022-02-07 NOTE — Assessment & Plan Note (Signed)
Continue CPAP at bedtime 

## 2022-02-07 NOTE — Assessment & Plan Note (Signed)
Controlled for now

## 2022-02-08 ENCOUNTER — Other Ambulatory Visit: Payer: Self-pay | Admitting: Oncology

## 2022-02-08 DIAGNOSIS — J690 Pneumonitis due to inhalation of food and vomit: Secondary | ICD-10-CM | POA: Diagnosis not present

## 2022-02-08 DIAGNOSIS — C3412 Malignant neoplasm of upper lobe, left bronchus or lung: Secondary | ICD-10-CM | POA: Diagnosis not present

## 2022-02-08 DIAGNOSIS — Z515 Encounter for palliative care: Secondary | ICD-10-CM | POA: Diagnosis not present

## 2022-02-08 DIAGNOSIS — J90 Pleural effusion, not elsewhere classified: Secondary | ICD-10-CM | POA: Diagnosis not present

## 2022-02-08 DIAGNOSIS — I10 Essential (primary) hypertension: Secondary | ICD-10-CM | POA: Diagnosis not present

## 2022-02-08 LAB — BASIC METABOLIC PANEL
Anion gap: 6 (ref 5–15)
BUN: 8 mg/dL (ref 8–23)
CO2: 27 mmol/L (ref 22–32)
Calcium: 8.8 mg/dL — ABNORMAL LOW (ref 8.9–10.3)
Chloride: 107 mmol/L (ref 98–111)
Creatinine, Ser: 0.6 mg/dL (ref 0.44–1.00)
GFR, Estimated: >60 mL/min (ref >60)
Glucose, Bld: 93 mg/dL (ref 70–99)
Potassium: 3.7 mmol/L (ref 3.5–5.1)
Sodium: 140 mmol/L (ref 135–145)

## 2022-02-08 LAB — COMP PANEL: LEUKEMIA/LYMPHOMA

## 2022-02-08 LAB — TRIGLYCERIDES, BODY FLUIDS: Triglycerides, Fluid: 24 mg/dL

## 2022-02-08 LAB — MRSA NEXT GEN BY PCR, NASAL: MRSA by PCR Next Gen: NOT DETECTED

## 2022-02-08 LAB — CBC
HCT: 29.6 % — ABNORMAL LOW (ref 36.0–46.0)
Hemoglobin: 9.6 g/dL — ABNORMAL LOW (ref 12.0–15.0)
MCH: 30.8 pg (ref 26.0–34.0)
MCHC: 32.4 g/dL (ref 30.0–36.0)
MCV: 94.9 fL (ref 80.0–100.0)
Platelets: 350 10*3/uL (ref 150–400)
RBC: 3.12 MIL/uL — ABNORMAL LOW (ref 3.87–5.11)
RDW: 14.5 % (ref 11.5–15.5)
WBC: 3 10*3/uL — ABNORMAL LOW (ref 4.0–10.5)
nRBC: 0 % (ref 0.0–0.2)

## 2022-02-08 LAB — CYTOLOGY - NON PAP

## 2022-02-08 NOTE — Progress Notes (Deleted)
Mobility Specialist - Progress Note    02/08/22 1306  Mobility  Activity Ambulated independently in hallway;Stood at bedside  Level of Assistance Independent  Assistive Device None  Distance Ambulated (ft) 480 ft  Activity Response Tolerated well  Mobility Referral Yes  $Mobility charge 1 Mobility   Pt resting in bed on RA upon entry. Pt STS and ambulates indep. Pt returns to bed and left with needs in reach. Daughter present in room.   Loma Sender Mobility Specialist 02/08/22, 1:08 PM

## 2022-02-08 NOTE — Assessment & Plan Note (Signed)
Controlled for now

## 2022-02-08 NOTE — Progress Notes (Signed)
Mobility Specialist - Progress Note   Pre-mobility: SpO2 (95) During mobility: SpO2 (87) Post-mobility: SPO2 (94)    02/08/22 1306  Mobility  Activity Ambulated independently in hallway;Stood at bedside  Level of Assistance Modified independent, requires aide device or extra time  Assistive Device Front wheel walker  Distance Ambulated (ft) 180 ft  Activity Response Tolerated well  Mobility Referral Yes  $Mobility charge 1 Mobility   Pt sitting in chair on RA upon arrival. Pt STS indep. and ambulates ModI in hallway around NS. Pt began displaying symptoms of SOB at about 50 ft while ambulating. Author checked SpO2 level (87) and initiated seated rest break; RN Notified. Pt maintained conversation despite the sudden SOB and SpO2 level improved after 2-3 minutes. Pt continued lap and returned to room. Pt left in chair with needs in reach.   Loma Sender Mobility Specialist 02/08/22, 1:37 PM

## 2022-02-08 NOTE — Progress Notes (Signed)
  Progress Note   Patient: Jennifer Hodges LTJ:[id number] DOB: June 05, 1945 DOA: 02/04/2022     4 DOS: the patient was seen and examined on 02/08/2022   Brief hospital course: 76 y.o. 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  Status post thoracentesis 11/25: Repeat CT chest looks stable or improved from before 11/26: Pending fluid results.  PT eval, still coughing 11/27: Chest PT, Levaquin and prednisone started per pulmonary as patient still quite symptomatic 11/28: Slow improvement, palliative care consult  Assessment and Plan: * Malignant neoplasm of upper lobe of left lung [facility]) Patient with a history of recurrent adenocarcinoma of the left lung currently on chemotherapy. Has completed radiation therapy Follow-up with oncology Jennifer Hodges at cancer center  [facility]) Patient presents to the ER for evaluation of right-sided pleuritic chest pain, cough productive of blood-tinged phlegm, worsening shortness of breath associated with fever and chills. 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  Hold meds with liquid as able Seen by pulmonary s/p thoracentesis with pending cytology.  Repeat CT chest on 11/25 shows stability or improvement from before Initially treated with Zosyn and Zithromax and now switched to oral Levaquin Negative blood cultures till now Patient still coughing -continue prednisone, chest PT and Levaquin.  Patient slowly  making progress  Pleural effusion on right Status post thoracentesis on 11/24 by pulmonary.  Fluid studies sent.  Cytology results pending.  oncology following Repeat CT chest on 11/25 shows improvement/stable findings from before. Pulmonary following-chest PT, Levaquin, prednisone started on 11/27 Slowly improving.  Still having coughing spells and feels short of breath, became hypoxic on minimal ambulation  Benign essential hypertension Controlled for now  Sleep apnea Continue CPAP at bedtime.         Subjective: Coughing and feels short of breath.  Husband at bedside feels patient is not ready to go home  Physical Exam: Vitals:   02/08/22 0349 02/08/22 0823 02/08/22 1200 02/08/22 1205  BP: 123/66 122/77 123/68   Pulse: 88 88 86   Resp: 18 18 18    Temp: 97.9 F (36.6 C) 98 F (36.7 C) 98.3 F (36.8 C)   TempSrc:      SpO2: 92% 92% 96% 93%  Weight:      Height:       76 year old female lying in the bed comfortably without any acute distress Lungs bilateral rhonchi, mild expiratory wheezing throughout both lungs Cardiovascular regular rate and rhythm Abdomen soft, benign Neuro alert and awake, nonfocal Skin no rash or lesion Data Reviewed:  There are no new results to review at this time.  Family Communication: Husband updated at bedside  Disposition: Status is: Inpatient Remains inpatient appropriate because: Slow clinical improvement.  Waiting for pleural fluid cytology results.  Chemotherapy postponed for 2 weeks per oncology as an outpatient.  Palliative care consult  Planned Discharge Destination: Home with Home Health   DVT prophylaxis-Lovenox Time spent: 35 minutes  Author: Max Sane, MD 02/08/2022 1:19 PM  For on call review [email or web address].

## 2022-02-08 NOTE — Care Management Important Message (Signed)
Important Message  Patient Details  Name: Jennifer Hodges MRN: [id number] Date of Birth: Aug 10, 1945   Medicare Important Message Given:  Yes     Juliann Pulse A Bransyn Adami 02/08/2022, 1:43 PM

## 2022-02-08 NOTE — Consult Note (Signed)
[place] at [facility] Telephone:([phone]) [phone] [phone]   Name: Jennifer Hodges Date: 02/08/2022 MRN: [id number]  DOB: November 23, 1945  Patient Care Team: [name], MD as PCP - General (Family Medicine) Telford Nab, RN as Registered Nurse Noreene Filbert, MD as Referring Physician (Radiation Oncology) Sindy Guadeloupe, MD as Consulting Physician (Oncology)    REASON FOR CONSULTATION: Jennifer Hodges is a 76 y.o. 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  Patient completed 4 cycles of carbo Alimta in May 2023 with scans showing partial response and plan for maintenance Alimta until progression or toxicity.  Patient was admitted to hospital 02/04/2022 with shortness of breath and was found to have right-sided pleural effusion on CT.  She has been treated for possible aspiration pneumonia in setting of vocal cord dysfunction and dysphagia.  Palliative care was consulted to help address goals.  SOCIAL HISTORY:     reports that she quit smoking about 14 years ago. Her smoking use included cigarettes. She has a 40.00 pack-year smoking history. She has never used smokeless tobacco. She reports that she does not drink alcohol and does not use drugs.  Patient is married lives at home with her husband.  She has a daughter who lives in [place].  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   ADVANCE DIRECTIVES:  Does not have  CODE STATUS: DNR  PAST MEDICAL HISTORY: Past Medical History:  Diagnosis Date   Anemia    Asthma    No Inhalers--Dr. [name] will order as needed   Bronchiectasis ([place])    mild   Chronic headaches     followed by [facility] migraines   COPD (chronic obstructive  pulmonary disease) (HCC)    DDD (degenerative disc disease), lumbar    Diverticulosis    Family history of adverse reaction to anesthesia    mother and sisters-PONV   Gall stones    history of   GERD (gastroesophageal reflux disease)    EGD 8/09- non bleeding erosive gastritis, documentd esophageal ulcerations.    Hiatal hernia    small   History of kidney stones    History of pneumonia    Hypercholesterolemia    IBS (irritable bowel syndrome)    Malignant neoplasm of upper lobe of left lung ([place]) 12/27/2018   Meniere disease    Murmur    Osteoarthritis    lumbar disc disease, left hip   Personal history of chemotherapy    Personal history of radiation therapy    Pneumonia 12/2020   PONV (postoperative nausea and vomiting)    Sleep apnea    uses cpap   Vertigo    Weakness of right side of body     PAST SURGICAL HISTORY:  Past Surgical History:  Procedure Laterality Date   ABDOMINAL HYSTERECTOMY  age 60   ANTERIOR CERVICAL DECOMP/DISCECTOMY FUSION N/A 02/24/2015   Procedure: CERVICAL FOUR-FIVE, CERVICAL FIVE-SIX, CERVICAL SIX-SEVEN ANTERIOR CERVICAL DECOMPRESSION/DISCECTOMY FUSION ;  Surgeon: [name], MD;  Location: [facility];  Service: Neurosurgery;  Laterality: N/A;  C45 C56 C67 anterior cervical decompression with fusion interbody prosthesis plating and bonegraft   APPENDECTOMY     BACK SURGERY  1988   4th lumbar fusion   BLADDER SURGERY N/A    with  vaginal wall repair   BREAST CYST ASPIRATION Bilateral    neg   BREAST SURGERY Bilateral    cyst removed and reduction   CARDIAC CATHETERIZATION  2014   CARPAL TUNNEL RELEASE Right 02/11/2016   Procedure: CARPAL TUNNEL RELEASE;  Surgeon: [name], MD;  Location: [facility];  Service: Orthopedics;  Laterality: Right;   CATARACT EXTRACTION W/ INTRAOCULAR LENS IMPLANT Bilateral 2015   CHOLECYSTECTOMY     EUS N/A 05/31/2012   Procedure: UPPER ENDOSCOPIC ULTRASOUND (EUS) LINEAR;  Surgeon: [name], MD;   Location: [facility];  Service: Endoscopy;  Laterality: N/A;   EXCISIONAL HEMORRHOIDECTOMY     JOINT REPLACEMENT Bilateral    KNEE ARTHROSCOPY WITH LATERAL MENISECTOMY Right 07/07/2015   Procedure: KNEE ARTHROSCOPY WITH LATERAL MENISECTOMY, PARTIAL SYNOVECTOMY;  Surgeon: [name], MD;  Location: [facility];  Service: Orthopedics;  Laterality: Right;   LUMBAR LAMINECTOMY     PORTA CATH INSERTION N/A 01/07/2019   Procedure: PORTA CATH INSERTION;  Surgeon: [name], MD;  Location: [facility];  Service: Cardiovascular;  Laterality: N/A;   REDUCTION MAMMAPLASTY  1990   RIGHT OOPHORECTOMY     TOTAL HIP ARTHROPLASTY Left 05/01/2014   Dr. [name]   TOTAL HIP ARTHROPLASTY Right 08/05/2014   Procedure: TOTAL HIP ARTHROPLASTY ANTERIOR APPROACH;  Surgeon: [name], MD;  Location: [facility];  Service: Orthopedics;  Laterality: Right;   ULNAR NERVE TRANSPOSITION Right 02/11/2016   Procedure: ULNAR NERVE DECOMPRESSION/TRANSPOSITION;  Surgeon: [name], MD;  Location: [facility];  Service: Orthopedics;  Laterality: Right;   VIDEO BRONCHOSCOPY WITH ENDOBRONCHIAL ULTRASOUND Left 12/19/2018   Procedure: VIDEO BRONCHOSCOPY WITH ENDOBRONCHIAL ULTRASOUND, LEFT, SLEEP APNEA;  Surgeon: [name], MD;  Location: [facility];  Service: Thoracic;  Laterality: Left;   VIDEO BRONCHOSCOPY WITH ENDOBRONCHIAL ULTRASOUND Right 05/14/2021   Procedure: VIDEO BRONCHOSCOPY WITH ENDOBRONCHIAL ULTRASOUND;  Surgeon: Tyler Pita, MD;  Location: [facility];  Service: Pulmonary;  Laterality: Right;    HEMATOLOGY/ONCOLOGY HISTORY:  Oncology History Overview Note  #October 2020-stage III lung cancer-chemoradiation; currently on maintenance durvalumab.   Malignant neoplasm of upper lobe of left lung ([place])  12/27/2018 Initial Diagnosis   Malignant neoplasm of upper lobe of left lung ([place])   12/27/2018 Cancer Staging   Staging form: Lung, AJCC 8th Edition - Clinical stage from 12/27/2018: Stage  IIIA (cT1b, cN2, cM0) - Signed by Sindy Guadeloupe, MD on 12/27/2018   01/14/2019 - 03/12/2019 Chemotherapy   The patient had dexamethasone (DECADRON) 4 MG tablet, 8 mg, Oral, Daily, 1 of 1 cycle, Start date: 12/28/2018, End date: 03/22/2019 palonosetron (ALOXI) injection 0.25 mg, 0.25 mg, Intravenous,  Once, 7 of 7 cycles Administration: 0.25 mg (01/14/2019), 0.25 mg (01/21/2019), 0.25 mg (01/28/2019), 0.25 mg (02/04/2019), 0.25 mg (02/12/2019), 0.25 mg (02/19/2019), 0.25 mg (03/12/2019) CARBOplatin (PARAPLATIN) 170 mg in sodium chloride 0.9 % 100 mL chemo infusion, 170 mg (100 % of original dose 171.2 mg), Intravenous,  Once, 7 of 7 cycles Dose modification:   (original dose 171.2 mg, Cycle 1) Administration: 170 mg (01/14/2019), 170 mg (01/21/2019), 170 mg (01/28/2019), 170 mg (02/04/2019), 170 mg (02/12/2019), 170 mg (02/19/2019), 130 mg (03/12/2019) PACLitaxel (TAXOL) 84 mg in sodium chloride 0.9 % 250 mL chemo infusion (</= 80mg /m2), 45 mg/m2 = 84 mg, Intravenous,  Once, 7 of 7 cycles Administration: 84 mg (01/14/2019), 84 mg (01/21/2019), 84 mg (01/28/2019), 84 mg (02/04/2019), 84 mg (02/12/2019), 84 mg (02/19/2019), 84 mg (03/12/2019)  for chemotherapy treatment.    04/04/2019 -  04/30/2020 Chemotherapy   Patient is on Treatment Plan : LUNG DURVALUMAB [id number]     05/26/2021 - 10/27/2021 Chemotherapy   Patient is on Treatment Plan : LUNG NSCLC Pemetrexed + Carboplatin q21d x 4 Cycles     11/14/2021 -  Chemotherapy   Patient is on Treatment Plan : LUNG NSCLC Pemetrexed + Carboplatin q21d x 3 Cycles       ALLERGIES:  is allergic to aspirin, celebrex [celecoxib], morphine and related, adhesive [tape], clarithromycin, codeine, darvon [propoxyphene hcl], demerol [meperidine], flonase [fluticasone propionate], simvastatin, and talwin [pentazocine].  MEDICATIONS:  Current Facility-Administered Medications  Medication Dose Route Frequency Provider Last Rate Last Admin   acetaminophen (TYLENOL) tablet 500 mg  500 mg  Oral BID Agbata, Tochukwu, MD   500 mg at 02/08/22 0901   albuterol (PROVENTIL) (2.5 MG/3ML) 0.083% nebulizer solution 2.5 mg  2.5 mg Nebulization QID [name], MD   2.5 mg at 02/08/22 1556   Chlorhexidine Gluconate Cloth 2 % PADS 6 each  6 each Topical Daily Max Sane, MD   6 each at 02/08/22 0910   cyanocobalamin (VITAMIN B12) tablet 2,500 mcg  2,500 mcg Oral Daily Max Sane, MD   2,500 mcg at 02/08/22 0859   cyclobenzaprine (FLEXERIL) tablet 10 mg  10 mg Oral QHS Agbata, Tochukwu, MD   10 mg at 02/07/22 2056   enoxaparin (LOVENOX) injection 40 mg  40 mg Subcutaneous Q24H Agbata, Tochukwu, MD   40 mg at 02/07/22 2057   ezetimibe (ZETIA) tablet 5 mg  5 mg Oral Daily Agbata, Tochukwu, MD   5 mg at 60/10/93 2355   folic acid (FOLVITE) tablet 0.5 mg  500 mcg Oral Daily Agbata, Tochukwu, MD   0.5 mg at 02/08/22 0900   HYDROcodone-acetaminophen (NORCO/VICODIN) 5-325 MG per tablet 1 tablet  1 tablet Oral QHS Sharion Settler, NP   1 tablet at 02/07/22 2056   levofloxacin (LEVAQUIN) tablet 750 mg  750 mg Oral Daily [name], MD   750 mg at 02/08/22 0900   ondansetron (ZOFRAN) tablet 4 mg  4 mg Oral Q6H PRN Agbata, Tochukwu, MD       Or   ondansetron (ZOFRAN) injection 4 mg  4 mg Intravenous Q6H PRN Agbata, Tochukwu, MD   4 mg at 02/05/22 0552   pantoprazole (PROTONIX) EC tablet 40 mg  40 mg Oral BID Agbata, Tochukwu, MD   40 mg at 02/08/22 0902   polyethylene glycol (MIRALAX / GLYCOLAX) packet 17 g  17 g Oral Daily PRN Agbata, Tochukwu, MD   17 g at 02/08/22 7322   polyvinyl alcohol (LIQUIFILM TEARS) 1.4 % ophthalmic solution 1 drop  1 drop Both Eyes TID PRN Agbata, Tochukwu, MD       predniSONE (DELTASONE) tablet 50 mg  50 mg Oral Q breakfast [name], MD   50 mg at 02/08/22 0902   Followed by   Derrill Memo ON 02/13/2022] predniSONE (DELTASONE) tablet 40 mg  40 mg Oral Q breakfast [name], MD       Followed by   Derrill Memo ON 02/14/2022] predniSONE (DELTASONE) tablet 30 mg  30 mg  Oral Q breakfast [name], MD       Followed by   Derrill Memo ON 02/15/2022] predniSONE (DELTASONE) tablet 20 mg  20 mg Oral Q breakfast [name], MD       Followed by   Derrill Memo ON 02/16/2022] predniSONE (DELTASONE) tablet 10 mg  10 mg Oral Q breakfast [name], MD  senna (SENOKOT) tablet 8.6 mg  1 tablet Oral Daily PRN Agbata, Tochukwu, MD       sodium chloride flush (NS) 0.9 % injection 10-40 mL  10-40 mL Intracatheter PRN Max Sane, MD       sucralfate (CARAFATE) 1 GM/10ML suspension 1 g  1 g Oral Daily Agbata, Tochukwu, MD   1 g at 02/08/22 0903   SUMAtriptan (IMITREX) tablet 100 mg  100 mg Oral Q2H PRN Dorothe Pea, RPH        VITAL SIGNS: BP 123/68   Pulse 86   Temp 98.3 F (36.8 C)   Resp 18   Ht 5\' 1"  (1.549 m)   Wt 175 lb 0.7 oz (79.4 kg)   SpO2 93%   BMI 33.07 kg/m  Filed Weights   02/04/22 1026 02/04/22 2106  Weight: 174 lb 9.7 oz (79.2 kg) 175 lb 0.7 oz (79.4 kg)    Estimated body mass index is 33.07 kg/m as calculated from the following:   Height as of this encounter: 5\' 1"  (1.549 m).   Weight as of this encounter: 175 lb 0.7 oz (79.4 kg).  LABS: CBC:    Component Value Date/Time   WBC 3.0 (L) 02/08/2022 0353   HGB 9.6 (L) 02/08/2022 0353   HGB 12.2 09/22/2011 0018   HCT 29.6 (L) 02/08/2022 0353   HCT 37.0 09/22/2011 0018   PLT 350 02/08/2022 0353   PLT 241 09/22/2011 0018   MCV 94.9 02/08/2022 0353   MCV 93 09/22/2011 0018   NEUTROABS 2.7 01/19/2022 0918   LYMPHSABS 0.6 (L) 01/19/2022 0918   MONOABS 0.5 01/19/2022 0918   EOSABS 0.1 01/19/2022 0918   BASOSABS 0.0 01/19/2022 0918   Comprehensive Metabolic Panel:    Component Value Date/Time   NA 140 02/08/2022 0353   NA 144 09/22/2011 0018   K 3.7 02/08/2022 0353   K 4.7 09/22/2011 0018   CL 107 02/08/2022 0353   CL 106 09/22/2011 0018   CO2 27 02/08/2022 0353   CO2 29 09/22/2011 0018   BUN 8 02/08/2022 0353   BUN 20 (H) 09/22/2011 0018   CREATININE 0.60 02/08/2022  0353   CREATININE 0.62 09/22/2011 0018   GLUCOSE 93 02/08/2022 0353   GLUCOSE 105 (H) 09/22/2011 0018   CALCIUM 8.8 (L) 02/08/2022 0353   CALCIUM 9.2 09/22/2011 0018   AST 23 02/04/2022 1028   AST 22 09/22/2011 0018   ALT 14 02/04/2022 1028   ALT 20 09/22/2011 0018   ALKPHOS 82 02/04/2022 1028   ALKPHOS 103 09/22/2011 0018   BILITOT 0.6 02/04/2022 1028   BILITOT 0.2 09/22/2011 0018   PROT 4.0 (L) 02/04/2022 1753   PROT 6.4 09/22/2011 0018   ALBUMIN 2.9 (L) 02/04/2022 1753   ALBUMIN 3.8 09/22/2011 0018    RADIOGRAPHIC STUDIES: CT CHEST WO CONTRAST  Result Date: 02/05/2022 CLINICAL DATA:  Non-small cell lung cancer; completed chemoradiation May 2023; * Tracking Code: BO * EXAM: CT CHEST WITHOUT CONTRAST TECHNIQUE: Multidetector CT imaging of the chest was performed following the standard protocol without IV contrast. RADIATION DOSE REDUCTION: This exam was performed according to the departmental dose-optimization program which includes automated exposure control, adjustment of the mA and/or kV according to patient size and/or use of iterative reconstruction technique. COMPARISON:  Chest CT dated February 04, 2022 FINDINGS: Cardiovascular: Normal heart size. Small pericardial effusion, similar to prior exam. Mitral annular calcifications. Moderate coronary artery calcifications. Normal caliber thoracic aorta with moderate calcified plaque. Mediastinum/Nodes: Esophagus is unremarkable.  No pathologically enlarged lymph nodes seen in the chest. Lungs/Pleura: Central airways are patent. Moderate centrilobular emphysema. Evolving right perihilar postradiation change with decreased ground-glass opacities and more linear confluent consolidations about the right hilum. Stable left perihilar postradiation change. Small right pleural effusion, decreased in size when compared with prior exam. New trace left pleural effusion. Stable solid right lower lobe pulmonary nodule measuring 4 mm on series 3, image  96. No new or enlarging pulmonary nodules Upper Abdomen: No acute abnormality. Musculoskeletal: No chest wall mass or suspicious bone lesions identified. IMPRESSION: 1. Evolving right perihilar postradiation and stable left perihilar postradiation change. No evidence of progressive metastatic disease in the chest. 2. Small right pleural effusion, decreased in size when compared with prior exam. 3. New trace left pleural effusion. 4. Aortic Atherosclerosis (ICD10-I70.0) and Emphysema (ICD10-J43.9). Electronically Signed   By: Yetta Glassman M.D.   On: 02/05/2022 12:57   DG Chest Port 1 View  Result Date: 02/04/2022 CLINICAL DATA:  Status post right thoracentesis. EXAM: PORTABLE CHEST 1 VIEW COMPARISON:  02/04/2022, earlier same day FINDINGS: 1843 hours. No evidence for right-sided pneumothorax status post thoracentesis. There is persistent right base collapse/consolidation with probable residual right pleural effusion. Architectural distortion/scarring noted right mid lung. Minimal atelectasis at the left base is stable. The cardiopericardial silhouette is within normal limits for size. The visualized bony structures of the thorax are unremarkable. Right Port-A-Cath again noted. IMPRESSION: 1. No evidence for right pneumothorax status post thoracentesis. 2. Persistent right base collapse/consolidation with probable residual right pleural effusion. Electronically Signed   By: Misty Stanley M.D.   On: 02/04/2022 18:56   CT Angio Chest PE W and/or Wo Contrast  Result Date: 02/04/2022 CLINICAL DATA:  A 76 year old female with suspected pulmonary embolism. Patient also with history of metastatic lung cancer. * Tracking Code: BO * EXAM: CT ANGIOGRAPHY CHEST WITH CONTRAST TECHNIQUE: Multidetector CT imaging of the chest was performed using the standard protocol during bolus administration of intravenous contrast. Multiplanar CT image reconstructions and MIPs were obtained to evaluate the vascular anatomy.  RADIATION DOSE REDUCTION: This exam was performed according to the departmental dose-optimization program which includes automated exposure control, adjustment of the mA and/or kV according to patient size and/or use of iterative reconstruction technique. CONTRAST:  86mL OMNIPAQUE IOHEXOL 350 MG/ML SOLN COMPARISON:  CT of the chest abdomen and pelvis dated November 11, 2021. FINDINGS: Cardiovascular: RIGHT sided Port-A-Cath terminates at the caval to atrial junction. Heart size is normal. There is a small pericardial effusion without nodularity that is unchanged. Aortic atherosclerosis both calcified and noncalcified throughout the thoracic aorta. Aorta displays in normal caliber without gross abnormality on the study optimized for pulmonary arterial assessment. Main pulmonary artery is opacified to 416 Hounsfield units. The study is negative for pulmonary embolism. Mediastinum/Nodes: No signs of discrete adenopathy in the mediastinum. RIGHT hilar fullness is increased and is contiguous with consolidative changes in the RIGHT chest, see below. There is no discrete mass lesion or central adenopathy to explain airspace process in the RIGHT chest. Post treatment changes about the LEFT hilum are similar to previous imaging. Esophagus is mildly patulous. Lungs/Pleura: Signs of RIGHT middle lobe consolidation that has developed since previous imaging where there was mainly ground-glass about the central RIGHT chest. Ground-glass and septal thickening seen throughout aerated portions of the RIGHT chest. Of note the patient had RIGHT mediastinal and hilar recurrence identified in early 2023 which was subsequently treated. Initial therapy occurred about the LEFT hilum which continues to  show post treatment changes which are not changed compared to August of 2023. A small to moderate RIGHT-sided pleural effusion is partially loculated towards the RIGHT lung apex and is increased in volume also loculated along the anterior  chest. Mild irregularity of the RIGHT mainstem bronchus with potential material within the lumen. No large volume material in the trachea. Slit like appearance of the LEFT mainstem bronchus. No LEFT-sided effusion. Upper Abdomen: Imaged portions the liver, pancreas, spleen, adrenal glands and kidneys are unremarkable. No acute gastrointestinal findings to the extent evaluated. No upper abdominal lymphadenopathy. Musculoskeletal: No acute bone finding. No destructive bone process. Spinal degenerative changes. Review of the MIP images confirms the above findings. IMPRESSION: 1. Negative for pulmonary embolism. 2. Signs of RIGHT middle lobe consolidation that has developed since previous imaging where there was mainly ground-glass about the central RIGHT chest. Findings also associated with increasing pleural fluid with loculated appearance in the LEFT chest but without signs of pleural enhancement at this time. Correlate with any symptoms of pneumonia. Findings could also reflect pneumonitis and should be correlated with any ongoing therapy. 3. Slit like appearance of mainstem bronchi raising the question of bronchomalacia. 4. Material in RIGHT lower lobe bronchi may represent aspirated or inspissated secretions. Correlate with any risk factors for or history of aspiration. There is no large volume material in the trachea or LEFT mainstem bronchus. 5. Stable appearance of post treatment changes about the LEFT hilum. Aortic Atherosclerosis (ICD10-I70.0). Electronically Signed   By: Zetta Bills M.D.   On: 02/04/2022 15:26   DG Chest 2 View  Result Date: 02/04/2022 CLINICAL DATA:  Shortness of breath. EXAM: CHEST - 2 VIEW COMPARISON:  CT examination dated November 11, 2021 FINDINGS: The heart size and mediastinal contours are within normal limits. Scarring with bronchiectasis in the right lower lobe with volume loss and elevation of the right hemidiaphragm, unchanged. Small right pleural effusion. There also mild  atelectasis/fibrotic changes at the left lung base. Right IJ access MediPort with distal tip in the SVC. Anterior cervical discectomy and fusion hardware. IMPRESSION: 1. Scarring with bronchiectasis in the right lower lobe with volume loss and elevation of the right hemidiaphragm, unchanged. This may represent postsurgical changes/treatment response. 2. Small right pleural effusion, underlying infectious/inflammatory process can not be excluded. Electronically Signed   By: Keane Police D.O.   On: 02/04/2022 10:47   MR Brain W Wo Contrast  Result Date: 01/21/2022 CLINICAL DATA:  Lung carcinoma.  Brain metastases suspected. EXAM: MRI HEAD WITHOUT AND WITH CONTRAST TECHNIQUE: Multiplanar, multiecho pulse sequences of the brain and surrounding structures were obtained without and with intravenous contrast. CONTRAST:  66mL GADAVIST GADOBUTROL 1 MMOL/ML IV SOLN COMPARISON:  Brain MRI 06/02/2021 FINDINGS: Brain: No acute infarct, mass effect or extra-axial collection. No acute or chronic hemorrhage. Normal white matter signal, parenchymal volume and CSF spaces. The midline structures are normal. There is no abnormal contrast enhancement. Vascular: Major flow voids are preserved. Skull and upper cervical spine: Normal calvarium and skull base. Visualized upper cervical spine and soft tissues are normal. Sinuses/Orbits:No paranasal sinus fluid levels or advanced mucosal thickening. No mastoid or middle ear effusion. Normal orbits. IMPRESSION: Normal brain MRI. No intracranial metastatic disease. Electronically Signed   By: Ulyses Jarred M.D.   On: 01/21/2022 14:23    PERFORMANCE STATUS (ECOG) : 2 - Symptomatic, <50% confined to bed  Review of Systems Unless otherwise noted, a complete review of systems is negative.  Physical Exam General: NAD Cardiovascular: regular rate  and rhythm Pulmonary: clear ant fields Abdomen: soft, nontender, + bowel sounds GU: no suprapubic tenderness Extremities: no edema, no  joint deformities Skin: no rashes Neurological: Weakness but otherwise nonfocal  IMPRESSION: Patient was admitted with shortness of breath found to have pleural effusion status postthoracentesis 11/24.  Cytology pending.  She is on antibiotics for possible aspiration pneumonia.  No evidence of progressive metastatic disease in the chest on CT 02/06/2019.    Patient says that she feels slightly improved today.  She denies distressing symptoms at present.  Patient says that she has been told by pulmonary that her lungs are "bad."  However, it is her hope that she will return to her previous baseline and be able to be fairly independent with her own self-care.  Patient says that she feels like she is reasonably tolerated cancer treatment. She says her only adverse effect is change in taste perception caused by the Alimta.  She remains committed to pursuing cancer treatment if that is the recommended by her oncologist.  At baseline, patient lives at home alone with her husband.  She has been fairly independent with her own self-care but recognizes that she might require more assistance following his hospitalization.  She asked about agencies that could provide aide services.  Will consult TOC to explore options with her.  Patient confirms that she is a DNR/DNI.  She cites the end-of-life care of a previous husband and says that she would not want her daughter to go through similar trauma with the loss of another parent.  Patient does not have advance directives but has previously reached out to her attorney to establish those.  I offered to consult the chaplain to provide those documents while she is hospitalized but she said she would prefer to discuss with her attorney.  PLAN: -Continue current scope of treatment -I am happy to follow up with patient in clinic following discharge from the hospital   Time Total: 60 minutes  Visit consisted of counseling and education dealing with the complex  and emotionally intense issues of symptom management and palliative care in the setting of serious and potentially life-threatening illness.Greater than 50%  of this time was spent counseling and coordinating care related to the above assessment and plan.  Signed by: Altha Harm, PhD, NP-C

## 2022-02-08 NOTE — Assessment & Plan Note (Signed)
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 Has completed radiation therapy Follow-up with oncology Dr. Janese Banks at cancer center

## 2022-02-08 NOTE — Assessment & Plan Note (Signed)
Continue CPAP at bedtime 

## 2022-02-08 NOTE — Assessment & Plan Note (Signed)
Status post thoracentesis on 11/24 by pulmonary.  Fluid studies sent.  Cytology results pending.  oncology following Repeat CT chest on 11/25 shows improvement/stable findings from before. Pulmonary following-chest PT, Levaquin, prednisone started on 11/27 Slowly improving.  Still having coughing spells and feels short of breath, became hypoxic on minimal ambulation

## 2022-02-08 NOTE — Progress Notes (Signed)
   02/08/22 1400  Clinical Encounter Type  Visited With Patient  Visit Type Initial  Referral From Nurse  Consult/Referral To [place] responded to [facility] consult to provide support to patient who recently lost her younger sister. Chaplain intervened with compassionated presence and conversation, allowing patient to express the grief associated with the loss and rehearse and walk through the difficult of missing the death and memorial service.

## 2022-02-08 NOTE — Assessment & Plan Note (Signed)
Patient presents to the ER for evaluation of right-sided pleuritic chest pain, cough productive of blood-tinged phlegm, worsening shortness of breath associated with fever and chills. 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  Hold meds with liquid as able Seen by pulmonary s/p thoracentesis with pending cytology.  Repeat CT chest on 11/25 shows stability or improvement from before Initially treated with Zosyn and Zithromax and now switched to oral Levaquin Negative blood cultures till now Patient still coughing -continue prednisone, chest PT and Levaquin.  Patient slowly making progress

## 2022-02-08 NOTE — Progress Notes (Signed)
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  Patient reports that she has been having shortness of breath for the past couple of months that was initially with exertion and slowly progressed to shortness of breath with rest.  Over the past 7 days, she has had further progression in her symptoms whereby she has been extremely winded at rest.  This was associated with a cough that is productive of sputum and report of chills at home.  She did not measure her temperature but thinks she might of had a fever.  The progression of her symptoms prompted her to present to the ED for an evaluation.  She does not have any chest pain or chest tightness, and denies any hemoptysis.  She does not have any GI or GU symptoms.  She has not had any weakness.  She reports subjective fevers and chills but has not measured her temperature.  In the ED, white count was within normal as were her hemodynamics.  She is afebrile in the ED.  Blood cultures were drawn and sent and she was given broad-spectrum antibiotics.  CT scan of the chest with PE protocol was performed and was negative for pulmonary embolism.  Right middle lobe consolidation was noted to be new as compared to prior imaging and there was an associated RIGHT sided pleural effusion, with possible loculation anteriorly. The CT was also notable for material in the right lower lobe bronchi concerning for aspirated/inspissated secretions.  Pulmonology was consulted to aid in the management of the patient's symptoms.  02/07/02- patient was seen and examined ,  she is on room air but rhonchorous bilaterally.  She had CT chest done which I reviewed, there are areas of atelectasis and possible pneumonitis.  She has been on antibiotics and does not appear acutely ill.  She will need more aggressive BPH with recruitment and we discussed narrowing regimen to PO to reduce IV infusions since she already had pleural effusion drained. We will start steroids for possible overlying pneumonitis.    02/08/22- patient seen and examined at bedside. Reports breathing is improved, she still has rhonchi bilaterally.  She is able to speak in full sentences. She is on levofloxacin and prednisone.  She uses metaneb qid.    Significant Hospital Events: Including procedures, antibiotic start and stop dates in addition to other pertinent events   02/04/2022: admitted, thoracentesis performed   Objective   Blood pressure 123/68, pulse 86, temperature 98.3 F (36.8 C), resp. rate 18, height 5\' 1"  (1.549 m), weight 79.4 kg, SpO2 93 %.        Intake/Output Summary (Last 24 hours) at 02/08/2022 1241 Last data filed at 02/08/2022 1042 Gross per 24 hour  Intake 240 ml  Output --  Net 240 ml   Filed Weights   02/04/22 1026 02/04/22 2106  Weight: 79.2 kg 79.4 kg    Examination:   Assessment & Plan:   Jennifer Hodges is a 76 year old female with a history of stage III lung adenocarcinoma presenting to the hospital with shortness of breath concerning for pneumonia with CT imaging notable for a  right-sided pleural effusion with signs of loculation.  # Acute hypoxic respiratory failure - due to compressive atelectasis ,pneumonitis, pleural effusion, lung cancer, COPD - patietn s/p thoracentesis and is on room air now   # Stage III non-small cell lung cancer- treatment post poned by oncologist while acutely ill  # Right-sided pleural effusion- s/p thoracentesis with cytology pending  # Right middle lobe consolidation- narrowed abx to levoquin only   # History of  radiation pneumonitis- cpntinue with prednisone at this time  Labs   CBC: Recent Labs  Lab 02/04/22 1028 02/05/22 0909 02/06/22 0549 02/08/22 0353  WBC 4.9 4.0 2.8* 3.0*  HGB 10.2* 9.6* 9.5* 9.6*  HCT 31.9* 29.6* 28.6* 29.6*  MCV 95.2 94.0 94.1 94.9  PLT 295 312 319 350     Basic Metabolic Panel: Recent Labs  Lab 02/04/22 1028 02/05/22 0909 02/06/22 0549 02/08/22 0353  NA 140 145 140 140  K 4.2 4.4 3.9 3.7  CL 104 111 107 107  CO2 27 27 25 27   GLUCOSE 106* 104* 92 93  BUN 10 8 8 8   CREATININE 0.57 0.61 0.67 0.60  CALCIUM 9.4 9.0 8.6* 8.8*    GFR: Estimated Creatinine Clearance: 56.2 mL/min (by C-G formula based on SCr of 0.6 mg/dL). Recent Labs  Lab 02/04/22 1028 02/05/22 0909 02/06/22 0549 02/08/22 0353  PROCALCITON <0.10 <0.10 <0.10  --   WBC 4.9 4.0 2.8* 3.0*     Liver Function Tests: Recent Labs  Lab 02/04/22 1028 02/04/22 1753  AST 23  --   ALT 14  --   ALKPHOS 82  --   BILITOT 0.6  --   PROT 6.7 4.0*  ALBUMIN 3.7 2.9*    No results for input(s): "LIPASE", "AMYLASE" in the last 168 hours. No results for input(s): "AMMONIA" in the last 168 hours.  ABG No results found for: "PHART", "PCO2ART", "PO2ART", "HCO3", "TCO2", "ACIDBASEDEF", "O2SAT"   Coagulation Profile: No results for input(s): "INR", "PROTIME" in the last 168 hours.  Cardiac Enzymes: No results for input(s): "CKTOTAL", "CKMB", "CKMBINDEX", "TROPONINI" in the last 168 hours.  HbA1C: Hgb A1c MFr Bld  Date/Time Value Ref Range Status  02/18/2015 01:46 PM 5.7 (H) 4.8 - 5.6 % Final    Comment:    (NOTE)         Pre-diabetes: 5.7 - 6.4         Diabetes: >6.4         Glycemic control for adults with diabetes: <7.0   11/11/2013 11:15 AM 5.7 4.6 - 6.5 % Final    Comment:    Glycemic Control Guidelines for People with Diabetes:Non Diabetic:  <6%Goal of Therapy: <7%Additional Action Suggested:  >8%     CBG: Recent Labs  Lab 02/06/22 1131 02/06/22 1555 02/06/22 2009  02/07/22 0736 02/07/22 1127  GLUCAP 99 111* 124* 112* 92     Review of Systems:   As per HPI  Past Medical History:  She,  has a past medical history of Anemia, Asthma, Bronchiectasis ([place]), Chronic headaches, COPD (chronic obstructive pulmonary disease) ([place]), DDD (degenerative disc disease), lumbar, Diverticulosis, Family history of adverse reaction to anesthesia, Gall stones, GERD (gastroesophageal reflux disease), Hiatal hernia, History of kidney stones, History of pneumonia, Hypercholesterolemia, IBS (irritable bowel syndrome), 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  Surgical History:   Past Surgical History:  Procedure Laterality Date   ABDOMINAL HYSTERECTOMY  age 78   ANTERIOR CERVICAL DECOMP/DISCECTOMY FUSION N/A 02/24/2015   Procedure: CERVICAL FOUR-FIVE, CERVICAL FIVE-SIX, CERVICAL SIX-SEVEN ANTERIOR CERVICAL DECOMPRESSION/DISCECTOMY FUSION ;  Surgeon: [name], MD;  Location: [place] [facility];  Service: Neurosurgery;  Laterality: N/A;  C45 C56 C67 anterior cervical decompression with fusion interbody prosthesis plating and bonegraft   APPENDECTOMY     BACK SURGERY  1988   4th lumbar fusion   BLADDER SURGERY N/A    with vaginal wall repair   BREAST CYST ASPIRATION Bilateral    neg   BREAST SURGERY Bilateral    cyst removed and reduction   CARDIAC CATHETERIZATION  2014   CARPAL TUNNEL RELEASE Right 02/11/2016   Procedure: CARPAL TUNNEL RELEASE;  Surgeon: [name], MD;  Location: [facility];  Service: Orthopedics;  Laterality: Right;   CATARACT EXTRACTION W/ INTRAOCULAR LENS IMPLANT Bilateral 2015   CHOLECYSTECTOMY     EUS N/A 05/31/2012   Procedure: UPPER ENDOSCOPIC ULTRASOUND (EUS) LINEAR;  Surgeon: [name], MD;  Location: [facility];  Service: Endoscopy;  Laterality: N/A;   EXCISIONAL HEMORRHOIDECTOMY     JOINT REPLACEMENT Bilateral    KNEE ARTHROSCOPY WITH LATERAL MENISECTOMY Right 07/07/2015   Procedure: KNEE ARTHROSCOPY WITH LATERAL MENISECTOMY, PARTIAL SYNOVECTOMY;  Surgeon: [name], MD;  Location: [facility];  Service: Orthopedics;  Laterality: Right;   LUMBAR LAMINECTOMY     PORTA CATH INSERTION N/A 01/07/2019   Procedure: PORTA CATH INSERTION;  Surgeon: [name], MD;  Location: [place] [facility];  Service: Cardiovascular;  Laterality: N/A;   REDUCTION MAMMAPLASTY  1990   RIGHT OOPHORECTOMY     TOTAL HIP ARTHROPLASTY Left 05/01/2014   Dr. [name]   TOTAL HIP ARTHROPLASTY Right 08/05/2014   Procedure: TOTAL HIP ARTHROPLASTY ANTERIOR APPROACH;  Surgeon: [name], MD;  Location: [facility];  Service: Orthopedics;  Laterality: Right;   ULNAR NERVE TRANSPOSITION Right 02/11/2016   Procedure: ULNAR NERVE DECOMPRESSION/TRANSPOSITION;  Surgeon: [name], MD;  Location: [facility];  Service: Orthopedics;  Laterality: Right;   VIDEO BRONCHOSCOPY WITH ENDOBRONCHIAL ULTRASOUND Left 12/19/2018   Procedure: VIDEO BRONCHOSCOPY WITH ENDOBRONCHIAL ULTRASOUND, LEFT, SLEEP APNEA;  Surgeon: [name], MD;  Location: [facility];  Service: Thoracic;  Laterality: Left;   VIDEO BRONCHOSCOPY WITH ENDOBRONCHIAL ULTRASOUND Right 05/14/2021   Procedure: VIDEO BRONCHOSCOPY WITH ENDOBRONCHIAL ULTRASOUND;  Surgeon: Tyler Pita, MD;  Location: [facility];  Service: Pulmonary;  Laterality: Right;     Social History:   reports that she quit smoking about 14 years ago. Her smoking use included cigarettes. She has a 40.00 pack-year smoking history. She has never used smokeless tobacco. She reports that she does not drink alcohol and does not use drugs.   Family History:  Her family history includes Breast cancer in her cousin and paternal aunt; Diabetes in her father; Heart disease in  her mother; Hypercholesterolemia in her mother; Hypertension in her mother; Stomach cancer in an other family member.   Allergies Allergies  Allergen Reactions   Aspirin Hives, Shortness Of Breath and Other (See Comments)    Difficulty breathing   Celebrex [Celecoxib] Shortness Of Breath   Morphine And Related Nausea And Vomiting and Swelling   Adhesive [Tape] Other (See Comments)    Took top layer of skin off when removed.   Clarithromycin Nausea And Vomiting   Codeine Nausea And Vomiting   Darvon [Propoxyphene Hcl] Nausea And Vomiting  Demerol [Meperidine] Nausea And Vomiting   Flonase [Fluticasone Propionate] Other (See Comments)    Fungal infection in nose   Simvastatin Other (See Comments)    "caused ulcers in mouth, and fever"   Talwin [Pentazocine] Nausea And Vomiting     Home Medications  Prior to Admission medications   Medication Sig Start Date End Date Taking? Authorizing Provider  fluconazole (DIFLUCAN) 100 MG tablet Take 1 tablet (100 mg total) by mouth daily for 7 days. 02/04/22 02/11/22 Yes Delman Kitten, MD  acetaminophen (TYLENOL) 500 MG tablet Take 500 mg by mouth 2 (two) times daily.    [provider]  albuterol (VENTOLIN HFA) 108 (90 Base) MCG/ACT inhaler Inhale 2 puffs into the lungs every 6 (six) hours as needed for wheezing or shortness of breath.    [provider]  Bioflavonoid Products (ESTER C PO) Take 1 tablet by mouth 3 (three) times daily. 500 mg    [provider]  Calcium-Magnesium-Zinc (CAL-MAG-ZINC PO) Take 1 tablet by mouth daily.    [provider]  carboxymethylcellulose (REFRESH PLUS) 0.5 % SOLN Place 1 drop into both eyes 3 (three) times daily as needed (dry eyes).    [provider]  cetirizine (ZYRTEC) 5 MG tablet Take 1 tablet (5 mg total) by mouth daily. Patient not taking: Reported on 12/29/2021 04/17/18   Coral Spikes, DO  chlorhexidine (PERIDEX) 0.12 % solution SMARTSIG:By Mouth Patient not  taking: Reported on 11/17/2021 09/28/21   [provider]  Cholecalciferol (EQL VITAMIN D3) 25 MCG (1000 UT) capsule Take 1,000 Units by mouth daily.    [provider]  Coenzyme Q10 (CO Q 10) 100 MG CAPS Take 100 mg by mouth daily.     [provider]  Cyanocobalamin (VITAMIN B-12) 2500 MCG SUBL Place 2,500 mcg under the tongue daily.    [provider]  cyclobenzaprine (FLEXERIL) 10 MG tablet Take 10 mg by mouth at bedtime.    [provider]  DHEA 25 MG CAPS Take 25 mg by mouth daily.    [provider]  ezetimibe (ZETIA) 10 MG tablet Take 5 mg by mouth in the morning and at bedtime. 10/26/18   [provider]  folic acid (FOLVITE) 384 MCG tablet Take 400 mcg by mouth daily.    [provider]  gabapentin (NEURONTIN) 100 MG capsule Take 100 mg by mouth 3 (three) times daily. Patient not taking: Reported on 10/06/2021    [provider]  Garlic 6659 MG CAPS Take 1,000 mg by mouth daily. Patient not taking: Reported on 12/29/2021    [provider]  Histamine Dihydrochloride (AUSTRALIAN DREAM ARTHRITIS) 0.025 % CREA Apply 1 application. topically 4 (four) times daily as needed (pain).    [provider]  HYDROcodone-acetaminophen (NORCO/VICODIN) 5-325 MG tablet 1-2 tabs po bid prn Patient not taking: Reported on 11/17/2021 12/09/18   Norval Gable, MD  levOCARNitine (L-CARNITINE) 250 MG TABS Take 1 tablet by mouth daily. With Chromium 250 mg    [provider]  meclizine (ANTIVERT) 25 MG tablet Take 1 tablet (25 mg total) by mouth every 6 (six) hours as needed for dizziness. Patient not taking: Reported on 11/17/2021 06/11/19   Sindy Guadeloupe, MD  ondansetron (ZOFRAN) 8 MG tablet Take by mouth every 8 (eight) hours as needed for nausea or vomiting.    [provider]  pantoprazole (PROTONIX) 40 MG tablet Take 40 mg by mouth 2 (two) times daily.    [provider]  polyethylene  glycol powder (GLYCOLAX/MIRALAX) 17 GM/SCOOP powder Take 17 g by mouth daily as needed for moderate constipation.    [provider]  pyridOXINE (VITAMIN B-6) 100 MG tablet Take 100 mg by mouth daily.    [provider]  Selenium 200 MCG CAPS Take 200 mcg by mouth daily.    [provider]  Sennosides (SENNA) 8.6 MG CAPS Take 1 capsule by mouth daily as needed (constipation).    [provider]  sucralfate (CARAFATE) 1 GM/10ML suspension Take 1 g by mouth daily. 02/13/19   [provider]  Tiotropium Bromide-Olodaterol (STIOLTO RESPIMAT) 2.5-2.5 MCG/ACT AERS Inhale 2 puffs into the lungs daily. 06/07/21   Tyler Pita, MD  tobramycin-dexamethasone [facility]) ophthalmic solution SMARTSIG:1 Drop(s) In Eye(s) 2-3 Times Daily PRN 08/26/21   [provider]  triamcinolone (NASACORT) 55 MCG/ACT AERO nasal inhaler Place 2 sprays into the nose daily.    [provider]  UBRELVY 100 MG TABS Take 100 mg by mouth daily as needed (migraines). 01/30/20   [provider]  Vitamin A 2400 MCG (8000 UT) CAPS Take 8,000 Units by mouth daily.    [provider]  Vitamin E 200 units TABS Take 200 Units by mouth daily.    [provider]  Wheat Dextrin (EQ FIBER POWDER PO) Take by mouth.    [provider]      [name], M.D.  Pulmonary & [place]

## 2022-02-09 ENCOUNTER — Inpatient Hospital Stay: Payer: Medicare Other

## 2022-02-09 ENCOUNTER — Inpatient Hospital Stay: Payer: Medicare Other | Admitting: Oncology

## 2022-02-09 DIAGNOSIS — C3412 Malignant neoplasm of upper lobe, left bronchus or lung: Secondary | ICD-10-CM | POA: Diagnosis not present

## 2022-02-09 LAB — BASIC METABOLIC PANEL
Anion gap: 6 (ref 5–15)
BUN: 9 mg/dL (ref 8–23)
CO2: 24 mmol/L (ref 22–32)
Calcium: 9.3 mg/dL (ref 8.9–10.3)
Chloride: 106 mmol/L (ref 98–111)
Creatinine, Ser: 0.68 mg/dL (ref 0.44–1.00)
GFR, Estimated: >60 mL/min (ref >60)
Glucose, Bld: 94 mg/dL (ref 70–99)
Potassium: 3.6 mmol/L (ref 3.5–5.1)
Sodium: 136 mmol/L (ref 135–145)

## 2022-02-09 LAB — CULTURE, BODY FLUID W GRAM STAIN -BOTTLE: Culture: NO GROWTH

## 2022-02-09 LAB — CULTURE, BLOOD (ROUTINE X 2)
Culture: NO GROWTH
Culture: NO GROWTH

## 2022-02-09 LAB — CBC
HCT: 30.9 % — ABNORMAL LOW (ref 36.0–46.0)
Hemoglobin: 9.9 g/dL — ABNORMAL LOW (ref 12.0–15.0)
MCH: 30 pg (ref 26.0–34.0)
MCHC: 32 g/dL (ref 30.0–36.0)
MCV: 93.6 fL (ref 80.0–100.0)
Platelets: 382 10*3/uL (ref 150–400)
RBC: 3.3 MIL/uL — ABNORMAL LOW (ref 3.87–5.11)
RDW: 14.5 % (ref 11.5–15.5)
WBC: 3.3 10*3/uL — ABNORMAL LOW (ref 4.0–10.5)
nRBC: 0 % (ref 0.0–0.2)

## 2022-02-09 MED ORDER — POLYETHYLENE GLYCOL 3350 17 G PO PACK: 17.0000 g | PACK | Freq: Every day | ORAL | Status: DC

## 2022-02-09 MED ORDER — LEVOFLOXACIN 750 MG PO TABS: 750.0000 mg | ORAL_TABLET | Freq: Every day | ORAL | 0 refills | Status: AC

## 2022-02-09 MED ORDER — PREDNISONE 10 MG PO TABS: ORAL_TABLET | ORAL | 0 refills | Status: AC

## 2022-02-09 MED ORDER — ALBUTEROL SULFATE (2.5 MG/3ML) 0.083% IN NEBU: 2.5000 mg | INHALATION_SOLUTION | Freq: Three times a day (TID) | RESPIRATORY_TRACT | Status: DC

## 2022-02-09 MED ORDER — SENNOSIDES-DOCUSATE SODIUM 8.6-50 MG PO TABS
1.0000 | ORAL_TABLET | Freq: Two times a day (BID) | ORAL | Status: DC
  Administered 2022-02-09: 1 via ORAL
  Filled 2022-02-09: qty 1

## 2022-02-09 MED ORDER — BISACODYL 5 MG PO TBEC: 5.0000 mg | DELAYED_RELEASE_TABLET | Freq: Every day | ORAL | Status: DC | PRN

## 2022-02-09 MED ORDER — HYDROCODONE-ACETAMINOPHEN 5-325 MG PO TABS: 1.0000 | ORAL_TABLET | Freq: Every day | ORAL | 0 refills | Status: DC

## 2022-02-09 NOTE — Discharge Summary (Signed)
Physician Discharge Summary   Patient: Jennifer Hodges MRN: [id number] DOB: 13-Jan-1946  Admit date:     02/04/2022  Discharge date: 02/09/22  Discharge Physician: Ezekiel Slocumb   PCP: [name], MD   Recommendations at discharge:    Follow up with Oncology Follow up with Pulmonology Follow up with Primary Care Repeat CBC, BMP in 1-2 weeks  Discharge Diagnoses: Principal Problem:   Malignant neoplasm of upper lobe of left lung (HCC) Active Problems:   Aspiration pneumonia (HCC)   Sleep apnea   Benign essential hypertension   Pleural effusion on right   Palliative care encounter  Resolved Problems:   * No resolved hospital problems. *  Hospital Course: 76 y.o. 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  Status post thoracentesis 11/25: Repeat CT chest looks stable or improved from before 11/26: Pending fluid results.  PT eval, still coughing 11/27: Chest PT, Levaquin and prednisone started per pulmonary as patient still quite symptomatic 11/28: Slow improvement, palliative care consult  11/29: Medically stable for discharge on PO antibiotic and prednisone taper.  Pulmonology recommends outpatient follow up with Dr. [name]  Assessment and Plan: * Malignant neoplasm of upper lobe of left lung [facility]) Patient with a history of recurrent adenocarcinoma of the left lung currently on chemotherapy. Has completed radiation therapy Follow-up with oncology Dr. Janese Banks at cancer center  Aspiration pneumonia [facility]) Patient presents to the ER for evaluation of right-sided pleuritic chest pain,  cough productive of blood-tinged phlegm, worsening shortness of breath associated with fever and chills. 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  Hold meds with liquid as able Seen by pulmonary s/p thoracentesis with pending cytology.  Repeat CT chest on 11/25 shows stability or improvement from before Initially treated with Zosyn and Zithromax and now switched to oral Levaquin Negative blood cultures till now --continue prednisone taper --continue Levaquin 3 more days.   --chest PT --Pulmonlogy follow up with Dr. [name] outpatient  Pleural effusion on right Status post thoracentesis on 11/24 by pulmonary.  Fluid studies sent.  Cytology results pending.  oncology following Repeat CT chest on 11/25 shows improvement/stable findings from before. Pulmonary following-chest PT, Levaquin, prednisone started on 11/27 Slowly improving.  Still having coughing spells and feels short of breath, became hypoxic on minimal ambulation  Benign essential hypertension Controlled for now  Sleep apnea Continue CPAP at bedtime.          Consultants: Pulmonology, Palliative Care, Oncology Procedures performed: Thoracentesis  Disposition: Home Diet recommendation:  Discharge Diet Orders (From admission, onward)     Start     Ordered   02/09/22 0000  Diet - low sodium heart healthy        02/09/22 1250            DISCHARGE MEDICATION: Allergies as of 02/09/2022       Reactions   Aspirin Hives, Shortness Of Breath, Other (See Comments)   Difficulty breathing   Celebrex [celecoxib] Shortness Of Breath   Morphine And  Related Nausea And Vomiting, Swelling   Adhesive [tape] Other (See Comments)   Took top layer of skin off when removed.   Clarithromycin Nausea And Vomiting   Codeine Nausea And Vomiting   Darvon [propoxyphene Hcl]  Nausea And Vomiting   Demerol [meperidine] Nausea And Vomiting   Flonase [fluticasone Propionate] Other (See Comments)   Fungal infection in nose   Simvastatin Other (See Comments)   "caused ulcers in mouth, and fever"   Talwin [pentazocine] Nausea And Vomiting        Medication List     STOP taking these medications    cetirizine 5 MG tablet Commonly known as: ZYRTEC   chlorhexidine 0.12 % solution Commonly known as: PERIDEX   gabapentin 100 MG capsule Commonly known as: NEURONTIN   Garlic 3086 MG Caps   meclizine 25 MG tablet Commonly known as: ANTIVERT   tobramycin-dexamethasone ophthalmic solution Commonly known as: TOBRADEX       TAKE these medications    acetaminophen 500 MG tablet Commonly known as: TYLENOL Take 500 mg by mouth 2 (two) times daily.   albuterol 108 (90 Base) MCG/ACT inhaler Commonly known as: VENTOLIN HFA Inhale 2 puffs into the lungs every 6 (six) hours as needed for wheezing or shortness of breath.   [place] Dream Arthritis 0.025 % Crea Generic drug: Histamine Dihydrochloride Apply 1 application. topically 4 (four) times daily as needed (pain).   CAL-MAG-ZINC PO Take 1 tablet by mouth daily.   carboxymethylcellulose 0.5 % Soln Commonly known as: REFRESH PLUS Place 1 drop into both eyes 3 (three) times daily as needed (dry eyes).   Co Q 10 100 MG Caps Take 100 mg by mouth daily.   cyclobenzaprine 10 MG tablet Commonly known as: FLEXERIL Take 10 mg by mouth at bedtime.   DHEA 25 MG Caps Take 25 mg by mouth daily.   EQ FIBER POWDER PO Take by mouth.   EQL Vitamin D3 25 MCG (1000 UT) capsule Generic drug: Cholecalciferol Take 1,000 Units by mouth daily.   ESTER C PO Take 1 tablet by mouth 3 (three) times daily. 500 mg   ezetimibe 10 MG tablet Commonly known as: ZETIA Take 5 mg by mouth in the morning and at bedtime.   fluconazole 100 MG tablet Commonly known as: Diflucan Take 1 tablet (100 mg total) by mouth  daily for 7 days.   folic acid 578 MCG tablet Commonly known as: FOLVITE Take 400 mcg by mouth daily.   HYDROcodone-acetaminophen 5-325 MG tablet Commonly known as: NORCO/VICODIN Take 1 tablet by mouth at bedtime. What changed:  how much to take how to take this when to take this additional instructions   L-Carnitine 250 MG Tabs Take 1 tablet by mouth daily. With Chromium 250 mg   levofloxacin 750 MG tablet Commonly known as: LEVAQUIN Take 1 tablet (750 mg total) by mouth daily for 3 days. Start taking on: February 10, 2022   pantoprazole 40 MG tablet Commonly known as: PROTONIX Take 40 mg by mouth 2 (two) times daily.   polyethylene glycol powder 17 GM/SCOOP powder Commonly known as: GLYCOLAX/MIRALAX Take 17 g by mouth daily as needed for moderate constipation.   predniSONE 10 MG tablet Commonly known as: DELTASONE Take 5 tablets (50 mg total) by mouth daily with breakfast for 3 days, THEN 4 tablets (40 mg total) daily with breakfast for 1 day, THEN 3 tablets (30 mg total) daily with breakfast for 1 day, THEN 2 tablets (20 mg total) daily with  breakfast for 1 day, THEN 1 tablet (10 mg total) daily with breakfast for 1 day. Start taking on: February 09, 2022   pyridOXINE 100 MG tablet Commonly known as: VITAMIN B6 Take 100 mg by mouth daily.   Selenium 200 MCG Caps Take 200 mcg by mouth daily.   Senna 8.6 MG Caps Take 1 capsule by mouth daily as needed (constipation).   Stiolto Respimat 2.5-2.5 MCG/ACT Aers Generic drug: Tiotropium Bromide-Olodaterol Inhale 2 puffs into the lungs daily.   sucralfate 1 GM/10ML suspension Commonly known as: CARAFATE Take 1 g by mouth daily.   triamcinolone 55 MCG/ACT Aero nasal inhaler Commonly known as: NASACORT Place 2 sprays into the nose daily.   Ubrelvy 100 MG Tabs Generic drug: Ubrogepant Take 100 mg by mouth daily as needed (migraines).   Vitamin A 2400 MCG (8000 UT) Caps Take 8,000 Units by mouth daily.   Vitamin  B-12 2500 MCG Subl Place 2,500 mcg under the tongue daily.   Vitamin E 200 units Tabs Take 200 Units by mouth daily.   Zofran 8 MG tablet Generic drug: ondansetron Take by mouth every 8 (eight) hours as needed for nausea or vomiting.        Discharge Exam: Filed Weights   02/04/22 1026 02/04/22 2106  Weight: 79.2 kg 79.4 kg   General exam: awake, alert, no acute distress HEENT: atraumatic, clear conjunctiva, anicteric sclera, moist mucus membranes, hearing grossly normal  Respiratory system: CTAB diminished Right base, no wheezes, rales or rhonchi, normal respiratory effort. Cardiovascular system: normal S1/S2, RRR,  no pedal edema.   Gastrointestinal system: soft, NT, ND, no HSM felt, +bowel sounds. Central nervous system: A&O x4. no gross focal neurologic deficits, normal speech Extremities: moves all, no edema, normal tone Skin: dry, intact, normal temperature, normal color, No rashes, lesions or ulcers Psychiatry: normal mood, congruent affect, judgement and insight appear normal   Condition at discharge: good  The results of significant diagnostics from this hospitalization (including imaging, microbiology, ancillary and laboratory) are listed below for reference.   Imaging Studies: DG Chest 2 View  Result Date: 02/09/2022 CLINICAL DATA:  Shortness of breath, chest pain.  Lung cancer. EXAM: CHEST - 2 VIEW COMPARISON:  02/04/2022 and CT chest 02/05/2022. FINDINGS: Right IJ Port-A-Cath terminates in the SVC. Trachea is midline. Heart size is grossly stable. Architectural distortion in the perihilar regions bilaterally, right greater than left, compatible with radiation therapy. Associated volume loss in the right hemithorax with an elevated right hemidiaphragm, stable. No definite new airspace consolidation. No definite pleural fluid. IMPRESSION: Post radiation changes in the lungs bilaterally. No definite superimposed acute process. Electronically Signed   By: Lorin Picket M.D.   On: 02/09/2022 08:28   CT CHEST WO CONTRAST  Result Date: 02/05/2022 CLINICAL DATA:  Non-small cell lung cancer; completed chemoradiation May 2023; * Tracking Code: BO * EXAM: CT CHEST WITHOUT CONTRAST TECHNIQUE: Multidetector CT imaging of the chest was performed following the standard protocol without IV contrast. RADIATION DOSE REDUCTION: This exam was performed according to the departmental dose-optimization program which includes automated exposure control, adjustment of the mA and/or kV according to patient size and/or use of iterative reconstruction technique. COMPARISON:  Chest CT dated February 04, 2022 FINDINGS: Cardiovascular: Normal heart size. Small pericardial effusion, similar to prior exam. Mitral annular calcifications. Moderate coronary artery calcifications. Normal caliber thoracic aorta with moderate calcified plaque. Mediastinum/Nodes: Esophagus is unremarkable. No pathologically enlarged lymph nodes seen in the chest. Lungs/Pleura: Central airways are patent. Moderate  centrilobular emphysema. Evolving right perihilar postradiation change with decreased ground-glass opacities and more linear confluent consolidations about the right hilum. Stable left perihilar postradiation change. Small right pleural effusion, decreased in size when compared with prior exam. New trace left pleural effusion. Stable solid right lower lobe pulmonary nodule measuring 4 mm on series 3, image 96. No new or enlarging pulmonary nodules Upper Abdomen: No acute abnormality. Musculoskeletal: No chest wall mass or suspicious bone lesions identified. IMPRESSION: 1. Evolving right perihilar postradiation and stable left perihilar postradiation change. No evidence of progressive metastatic disease in the chest. 2. Small right pleural effusion, decreased in size when compared with prior exam. 3. New trace left pleural effusion. 4. Aortic Atherosclerosis (ICD10-I70.0) and Emphysema (ICD10-J43.9).  Electronically Signed   By: Yetta Glassman M.D.   On: 02/05/2022 12:57   DG Chest Port 1 View  Result Date: 02/04/2022 CLINICAL DATA:  Status post right thoracentesis. EXAM: PORTABLE CHEST 1 VIEW COMPARISON:  02/04/2022, earlier same day FINDINGS: 1843 hours. No evidence for right-sided pneumothorax status post thoracentesis. There is persistent right base collapse/consolidation with probable residual right pleural effusion. Architectural distortion/scarring noted right mid lung. Minimal atelectasis at the left base is stable. The cardiopericardial silhouette is within normal limits for size. The visualized bony structures of the thorax are unremarkable. Right Port-A-Cath again noted. IMPRESSION: 1. No evidence for right pneumothorax status post thoracentesis. 2. Persistent right base collapse/consolidation with probable residual right pleural effusion. Electronically Signed   By: Misty Stanley M.D.   On: 02/04/2022 18:56   CT Angio Chest PE W and/or Wo Contrast  Result Date: 02/04/2022 CLINICAL DATA:  A 76 year old female with suspected pulmonary embolism. Patient also with history of metastatic lung cancer. * Tracking Code: BO * EXAM: CT ANGIOGRAPHY CHEST WITH CONTRAST TECHNIQUE: Multidetector CT imaging of the chest was performed using the standard protocol during bolus administration of intravenous contrast. Multiplanar CT image reconstructions and MIPs were obtained to evaluate the vascular anatomy. RADIATION DOSE REDUCTION: This exam was performed according to the departmental dose-optimization program which includes automated exposure control, adjustment of the mA and/or kV according to patient size and/or use of iterative reconstruction technique. CONTRAST:  46mL OMNIPAQUE IOHEXOL 350 MG/ML SOLN COMPARISON:  CT of the chest abdomen and pelvis dated November 11, 2021. FINDINGS: Cardiovascular: RIGHT sided Port-A-Cath terminates at the caval to atrial junction. Heart size is normal. There is a small  pericardial effusion without nodularity that is unchanged. Aortic atherosclerosis both calcified and noncalcified throughout the thoracic aorta. Aorta displays in normal caliber without gross abnormality on the study optimized for pulmonary arterial assessment. Main pulmonary artery is opacified to 416 Hounsfield units. The study is negative for pulmonary embolism. Mediastinum/Nodes: No signs of discrete adenopathy in the mediastinum. RIGHT hilar fullness is increased and is contiguous with consolidative changes in the RIGHT chest, see below. There is no discrete mass lesion or central adenopathy to explain airspace process in the RIGHT chest. Post treatment changes about the LEFT hilum are similar to previous imaging. Esophagus is mildly patulous. Lungs/Pleura: Signs of RIGHT middle lobe consolidation that has developed since previous imaging where there was mainly ground-glass about the central RIGHT chest. Ground-glass and septal thickening seen throughout aerated portions of the RIGHT chest. Of note the patient had RIGHT mediastinal and hilar recurrence identified in early 2023 which was subsequently treated. Initial therapy occurred about the LEFT hilum which continues to show post treatment changes which are not changed compared to August of 2023. A small  to moderate RIGHT-sided pleural effusion is partially loculated towards the RIGHT lung apex and is increased in volume also loculated along the anterior chest. Mild irregularity of the RIGHT mainstem bronchus with potential material within the lumen. No large volume material in the trachea. Slit like appearance of the LEFT mainstem bronchus. No LEFT-sided effusion. Upper Abdomen: Imaged portions the liver, pancreas, spleen, adrenal glands and kidneys are unremarkable. No acute gastrointestinal findings to the extent evaluated. No upper abdominal lymphadenopathy. Musculoskeletal: No acute bone finding. No destructive bone process. Spinal degenerative changes.  Review of the MIP images confirms the above findings. IMPRESSION: 1. Negative for pulmonary embolism. 2. Signs of RIGHT middle lobe consolidation that has developed since previous imaging where there was mainly ground-glass about the central RIGHT chest. Findings also associated with increasing pleural fluid with loculated appearance in the LEFT chest but without signs of pleural enhancement at this time. Correlate with any symptoms of pneumonia. Findings could also reflect pneumonitis and should be correlated with any ongoing therapy. 3. Slit like appearance of mainstem bronchi raising the question of bronchomalacia. 4. Material in RIGHT lower lobe bronchi may represent aspirated or inspissated secretions. Correlate with any risk factors for or history of aspiration. There is no large volume material in the trachea or LEFT mainstem bronchus. 5. Stable appearance of post treatment changes about the LEFT hilum. Aortic Atherosclerosis (ICD10-I70.0). Electronically Signed   By: Zetta Bills M.D.   On: 02/04/2022 15:26   DG Chest 2 View  Result Date: 02/04/2022 CLINICAL DATA:  Shortness of breath. EXAM: CHEST - 2 VIEW COMPARISON:  CT examination dated November 11, 2021 FINDINGS: The heart size and mediastinal contours are within normal limits. Scarring with bronchiectasis in the right lower lobe with volume loss and elevation of the right hemidiaphragm, unchanged. Small right pleural effusion. There also mild atelectasis/fibrotic changes at the left lung base. Right IJ access MediPort with distal tip in the SVC. Anterior cervical discectomy and fusion hardware. IMPRESSION: 1. Scarring with bronchiectasis in the right lower lobe with volume loss and elevation of the right hemidiaphragm, unchanged. This may represent postsurgical changes/treatment response. 2. Small right pleural effusion, underlying infectious/inflammatory process can not be excluded. Electronically Signed   By: Keane Police D.O.   On: 02/04/2022  10:47   MR Brain W Wo Contrast  Result Date: 01/21/2022 CLINICAL DATA:  Lung carcinoma.  Brain metastases suspected. EXAM: MRI HEAD WITHOUT AND WITH CONTRAST TECHNIQUE: Multiplanar, multiecho pulse sequences of the brain and surrounding structures were obtained without and with intravenous contrast. CONTRAST:  6mL GADAVIST GADOBUTROL 1 MMOL/ML IV SOLN COMPARISON:  Brain MRI 06/02/2021 FINDINGS: Brain: No acute infarct, mass effect or extra-axial collection. No acute or chronic hemorrhage. Normal white matter signal, parenchymal volume and CSF spaces. The midline structures are normal. There is no abnormal contrast enhancement. Vascular: Major flow voids are preserved. Skull and upper cervical spine: Normal calvarium and skull base. Visualized upper cervical spine and soft tissues are normal. Sinuses/Orbits:No paranasal sinus fluid levels or advanced mucosal thickening. No mastoid or middle ear effusion. Normal orbits. IMPRESSION: Normal brain MRI. No intracranial metastatic disease. Electronically Signed   By: Ulyses Jarred M.D.   On: 01/21/2022 14:23    Microbiology: Results for orders placed or performed during the hospital encounter of 02/04/22  Resp Panel by RT-PCR (Flu A&B, Covid) Anterior Nasal Swab     Status: None   Collection Time: 02/04/22  1:55 PM   Specimen: Anterior Nasal Swab  Result Value Ref  Range Status   SARS Coronavirus 2 by RT PCR NEGATIVE NEGATIVE Final    Comment: (NOTE) SARS-CoV-2 target nucleic acids are NOT DETECTED.  The SARS-CoV-2 RNA is generally detectable in upper respiratory specimens during the acute phase of infection. The lowest concentration of SARS-CoV-2 viral copies this assay can detect is 138 copies/mL. A negative result does not preclude SARS-Cov-2 infection and should not be used as the sole basis for treatment or other patient management decisions. A negative result may occur with  improper specimen collection/handling, submission of specimen  other than nasopharyngeal swab, presence of viral mutation(s) within the areas targeted by this assay, and inadequate number of viral copies(<138 copies/mL). A negative result must be combined with clinical observations, patient history, and epidemiological information. The expected result is Negative.  Fact Sheet for Patients:  EntrepreneurPulse.com.au  Fact Sheet for Healthcare Providers:  IncredibleEmployment.be  This test is no t yet approved or cleared by the [place] FDA and  has been authorized for detection and/or diagnosis of SARS-CoV-2 by FDA under an Emergency Use Authorization (EUA). This EUA will remain  in effect (meaning this test can be used) for the duration of the COVID-19 declaration under Section 564(b)(1) of the Act, 21 U.S.C.section 360bbb-3(b)(1), unless the authorization is terminated  or revoked sooner.       Influenza A by PCR NEGATIVE NEGATIVE Final   Influenza B by PCR NEGATIVE NEGATIVE Final    Comment: (NOTE) The Xpert Xpress SARS-CoV-2/FLU/RSV plus assay is intended as an aid in the diagnosis of influenza from Nasopharyngeal swab specimens and should not be used as a sole basis for treatment. Nasal washings and aspirates are unacceptable for Xpert Xpress SARS-CoV-2/FLU/RSV testing.  Fact Sheet for Patients: EntrepreneurPulse.com.au  Fact Sheet for Healthcare Providers: IncredibleEmployment.be  This test is not yet approved or cleared by the [place] FDA and has been authorized for detection and/or diagnosis of SARS-CoV-2 by FDA under an Emergency Use Authorization (EUA). This EUA will remain in effect (meaning this test can be used) for the duration of the COVID-19 declaration under Section 564(b)(1) of the Act, 21 U.S.C. section 360bbb-3(b)(1), unless the authorization is terminated or revoked.  Performed at [facility], [address]  [place], [place] [address]   Blood culture (routine x 2)     Status: None   Collection Time: 02/04/22  4:31 PM   Specimen: BLOOD RIGHT FOREARM  Result Value Ref Range Status   Specimen Description BLOOD RIGHT FOREARM  Final   Special Requests   Final    BOTTLES DRAWN AEROBIC AND ANAEROBIC Blood Culture results may not be optimal due to an inadequate volume of blood received in culture bottles   Culture   Final    NO GROWTH 5 DAYS Performed at [facility], [address], [place], [place] [address]    Report Status 02/09/2022 FINAL  Final  Blood culture (routine x 2)     Status: None   Collection Time: 02/04/22  4:36 PM   Specimen: Left Antecubital; Blood  Result Value Ref Range Status   Specimen Description LEFT ANTECUBITAL  Final   Special Requests   Final    BOTTLES DRAWN AEROBIC AND ANAEROBIC Blood Culture results may not be optimal due to an inadequate volume of blood received in culture bottles   Culture   Final    NO GROWTH 5 DAYS Performed at [facility], [address] [place], [place] [address]    Report Status 02/09/2022 FINAL  Final  MRSA Next Gen by PCR, Nasal     Status: None   Collection Time: 02/04/22  5:30 PM   Specimen: Nasal Mucosa; Nasal Swab  Result Value Ref Range Status   MRSA by PCR Next Gen NOT DETECTED NOT DETECTED Final    Comment: (NOTE) The GeneXpert MRSA Assay (FDA approved for NASAL specimens only), is one component of a comprehensive MRSA colonization surveillance program. It is not intended to diagnose MRSA infection nor to guide or monitor treatment for MRSA infections. Test performance is not FDA approved in patients less than 58 years old. Performed at [facility], [address] [place], [place] [address]   Culture, Respiratory w Gram Stain     Status: None (Preliminary result)   Collection Time: 02/04/22  6:15 PM   Specimen: SPU; Respiratory  Result Value Ref Range Status   Specimen Description   Final     SPUTUM Performed at [facility], [address], [place], [place] [address]    Special Requests   Final    EXPSU Performed at [facility], [address] [place], [place] [address]    Gram Stain   Final    FEW SQUAMOUS EPITHELIAL CELLS PRESENT FEW GRAM POSITIVE COCCI IN CLUSTERS Performed at [facility], [place] [address], [place], [place] [address]    Culture PENDING  Incomplete   Report Status PENDING  Incomplete  Culture, body fluid w Gram Stain-bottle     Status: None   Collection Time: 02/04/22  9:24 PM   Specimen: Pleura  Result Value Ref Range Status   Specimen Description PLEURAL  Final   Special Requests NONE  Final   Culture   Final    NO GROWTH 5 DAYS Performed at [facility], [address] [place], [place] [address]    Report Status 02/09/2022 FINAL  Final  Gram stain     Status: None   Collection Time: 02/04/22  9:24 PM   Specimen: Pleura  Result Value Ref Range Status   Specimen Description PLEURAL  Final   Special Requests NONE  Final   Gram Stain   Final    FEW WBC PRESENT,BOTH PMN AND MONONUCLEAR NO ORGANISMS SEEN Performed at [facility], [address] [place],  [address]    Report Status 02/05/2022 FINAL  Final    Labs: CBC: Recent Labs  Lab 02/04/22 1028 02/05/22 0909 02/06/22 0549 02/08/22 0353 02/09/22 0525  WBC 4.9 4.0 2.8* 3.0* 3.3*  HGB 10.2* 9.6* 9.5* 9.6* 9.9*  HCT 31.9* 29.6* 28.6* 29.6* 30.9*  MCV 95.2 94.0 94.1 94.9 93.6  PLT 295 312 319 350 157   Basic Metabolic Panel: Recent Labs  Lab 02/04/22 1028 02/05/22 0909 02/06/22 0549 02/08/22 0353 02/09/22 0525  NA 140 145 140 140 136  K 4.2 4.4 3.9 3.7 3.6  CL 104 111 107 107 106  CO2 27 27 25 27 24   GLUCOSE 106* 104* 92 93 94  BUN 10 8 8 8 9   CREATININE 0.57 0.61 0.67 0.60 0.68  CALCIUM 9.4 9.0 8.6* 8.8* 9.3   Liver Function Tests: Recent Labs  Lab 02/04/22 1028 02/04/22 1753  AST 23  --   ALT 14  --   ALKPHOS 82   --   BILITOT 0.6  --   PROT 6.7 4.0*  ALBUMIN 3.7 2.9*   CBG: Recent Labs  Lab 02/06/22 1131 02/06/22 1555 02/06/22 2009 02/07/22 0736 02/07/22 1127  GLUCAP 99 111* 124* 112* 92    Discharge time spent: less than 30 minutes.  Signed: Ezekiel Slocumb, DO [facility] 02/09/2022

## 2022-02-09 NOTE — Progress Notes (Signed)
NAME:  KEYONNI PERCIVAL, MRN:  [id number], DOB:  [birth year], LOS: 5 ADMISSION DATE:  02/04/2022, CONSULTATION DATE:  02/04/2022 REFERRING MD: Collier Bullock, MD, CHIEF COMPLAINT:  shortness of breath   History of Present Illness:   Mrs. 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  Patient reports that she has been having shortness of breath for the past couple of months that was initially with exertion and slowly progressed to shortness of breath with rest.  Over the past 7 days, she has had further progression in her symptoms whereby she has been extremely winded at rest.  This was associated with a cough that is productive of sputum and report of chills at home.  She did not measure her temperature but thinks she might of had a fever.  The progression of her symptoms prompted her to present to the ED for an evaluation.  She does not have any chest pain or chest tightness, and denies any hemoptysis.  She does not have any GI or GU symptoms.  She has not had any weakness.  She reports subjective fevers and chills but has not measured her temperature.  In the ED, white count was within normal as were her hemodynamics.  She is afebrile in the ED.  Blood cultures were drawn and sent and she was given broad-spectrum antibiotics.  CT scan of the chest with PE protocol was performed and was negative for pulmonary embolism.  Right middle lobe consolidation was noted to be new as compared to prior imaging and there was an associated RIGHT sided pleural effusion, with possible loculation anteriorly. The CT was also notable for material in the right lower lobe bronchi concerning for aspirated/inspissated secretions.  Pulmonology was consulted to aid in the management of the patient's symptoms.  02/07/02- patient was seen and examined ,  she is on room air but rhonchorous bilaterally.  She had CT chest done which I reviewed, there are areas of atelectasis and possible pneumonitis.  She has been on antibiotics and does not appear acutely ill.  She will need more aggressive BPH with recruitment and we discussed narrowing regimen to PO to reduce IV infusions since she already had pleural effusion drained. We will start steroids for possible overlying pneumonitis.    02/08/22- patient seen and examined at bedside. Reports breathing is improved, she still has rhonchi bilaterally.  She is able to speak in full sentences. She is on levofloxacin and prednisone.  She uses metaneb qid.   02/09/22- patient is cleared for dc home.  She is to finish levoquin and pred taper and be seen by Dr [name] within 2 weeks  [facility] Events: Including procedures, antibiotic start and stop dates in addition to other pertinent events   02/04/2022: admitted, thoracentesis performed   Objective   Blood pressure 125/69, pulse 89, temperature 98.4 F (36.9 C), temperature source Oral, resp. rate 20, height 5\' 1"  (1.549 m), weight 79.4 kg, SpO2 94 %.        Intake/Output Summary (Last 24 hours) at 02/09/2022 0746 Last data filed at 02/08/2022 1042 Gross per 24 hour  Intake 240 ml  Output --  Net 240 ml    Filed Weights   02/04/22 1026 02/04/22 2106  Weight: 79.2 kg 79.4 kg    Examination:   Assessment & Plan:   Leafy Kindle  is a 76 year old female with a history of stage III lung adenocarcinoma presenting to the hospital with shortness of breath concerning for pneumonia with CT imaging notable for a right-sided pleural effusion with signs of loculation.  # Acute hypoxic respiratory failure - due to compressive atelectasis ,pneumonitis, pleural effusion, lung cancer, COPD - patietn s/p thoracentesis and is on room air now   # Stage III non-small cell lung cancer- treatment post poned by oncologist while acutely ill  #  Right-sided pleural effusion- s/p thoracentesis with cytology pending  # Right middle lobe consolidation- narrowed abx to levoquin only   # History of radiation pneumonitis- cpntinue with prednisone at this time  Labs   CBC: Recent Labs  Lab 02/04/22 1028 02/05/22 0909 02/06/22 0549 02/08/22 0353 02/09/22 0525  WBC 4.9 4.0 2.8* 3.0* 3.3*  HGB 10.2* 9.6* 9.5* 9.6* 9.9*  HCT 31.9* 29.6* 28.6* 29.6* 30.9*  MCV 95.2 94.0 94.1 94.9 93.6  PLT 295 312 319 350 382     Basic Metabolic Panel: Recent Labs  Lab 02/04/22 1028 02/05/22 0909 02/06/22 0549 02/08/22 0353 02/09/22 0525  NA 140 145 140 140 136  K 4.2 4.4 3.9 3.7 3.6  CL 104 111 107 107 106  CO2 27 27 25 27 24   GLUCOSE 106* 104* 92 93 94  BUN 10 8 8 8 9   CREATININE 0.57 0.61 0.67 0.60 0.68  CALCIUM 9.4 9.0 8.6* 8.8* 9.3    GFR: Estimated Creatinine Clearance: 56.2 mL/min (by C-G formula based on SCr of 0.68 mg/dL). Recent Labs  Lab 02/04/22 1028 02/05/22 0909 02/06/22 0549 02/08/22 0353 02/09/22 0525  PROCALCITON <0.10 <0.10 <0.10  --   --   WBC 4.9 4.0 2.8* 3.0* 3.3*     Liver Function Tests: Recent Labs  Lab 02/04/22 1028 02/04/22 1753  AST 23  --   ALT 14  --   ALKPHOS 82  --   BILITOT 0.6  --   PROT 6.7 4.0*  ALBUMIN 3.7 2.9*    No results for input(s): "LIPASE", "AMYLASE" in the last 168 hours. No results for input(s): "AMMONIA" in the last 168 hours.  ABG No results found for: "PHART", "PCO2ART", "PO2ART", "HCO3", "TCO2", "ACIDBASEDEF", "O2SAT"   Coagulation Profile: No results for input(s): "INR", "PROTIME" in the last 168 hours.  Cardiac Enzymes: No results for input(s): "CKTOTAL", "CKMB", "CKMBINDEX", "TROPONINI" in the last 168 hours.  HbA1C: Hgb A1c MFr Bld  Date/Time Value Ref Range Status  02/18/2015 01:46 PM 5.7 (H) 4.8 - 5.6 % Final    Comment:    (NOTE)         Pre-diabetes: 5.7 - 6.4         Diabetes: >6.4         Glycemic control for adults with diabetes: <7.0    11/11/2013 11:15 AM 5.7 4.6 - 6.5 % Final    Comment:    Glycemic Control Guidelines for People with Diabetes:Non Diabetic:  <6%Goal of Therapy: <7%Additional Action Suggested:  >8%     CBG: Recent Labs  Lab 02/06/22 1131 02/06/22 1555 02/06/22 2009 02/07/22 0736 02/07/22 1127  GLUCAP 99 111* 124* 112* 92     Review of Systems:   As per HPI  Past Medical History:  She,  has a past medical history of Anemia, Asthma, Bronchiectasis ([place]), Chronic headaches, COPD (chronic obstructive pulmonary disease) ([place]), DDD (degenerative disc disease), lumbar, Diverticulosis, Family history of adverse reaction to anesthesia, Gall stones, GERD (gastroesophageal reflux disease), Hiatal hernia, History  of kidney stones, History of pneumonia, Hypercholesterolemia, IBS (irritable bowel syndrome), Malignant neoplasm of upper lobe of left lung ([place]) (12/27/2018), Meniere disease, Murmur, Osteoarthritis, Personal history of chemotherapy, Personal history of radiation therapy, Pneumonia (12/2020), PONV (postoperative nausea and vomiting), Sleep apnea, Vertigo, and Weakness of right side of body.   Surgical History:   Past Surgical History:  Procedure Laterality Date   ABDOMINAL HYSTERECTOMY  age 61   ANTERIOR CERVICAL DECOMP/DISCECTOMY FUSION N/A 02/24/2015   Procedure: CERVICAL FOUR-FIVE, CERVICAL FIVE-SIX, CERVICAL SIX-SEVEN ANTERIOR CERVICAL DECOMPRESSION/DISCECTOMY FUSION ;  Surgeon: [name], MD;  Location: [place] [facility];  Service: Neurosurgery;  Laterality: N/A;  C45 C56 C67 anterior cervical decompression with fusion interbody prosthesis plating and bonegraft   APPENDECTOMY     BACK SURGERY  1988   4th lumbar fusion   BLADDER SURGERY N/A    with vaginal wall repair   BREAST CYST ASPIRATION Bilateral    neg   BREAST SURGERY Bilateral    cyst removed and reduction   CARDIAC CATHETERIZATION  2014   CARPAL TUNNEL RELEASE Right 02/11/2016   Procedure: CARPAL TUNNEL RELEASE;  Surgeon:  [name], MD;  Location: [facility];  Service: Orthopedics;  Laterality: Right;   CATARACT EXTRACTION W/ INTRAOCULAR LENS IMPLANT Bilateral 2015   CHOLECYSTECTOMY     EUS N/A 05/31/2012   Procedure: UPPER ENDOSCOPIC ULTRASOUND (EUS) LINEAR;  Surgeon: [name], MD;  Location: [facility];  Service: Endoscopy;  Laterality: N/A;   EXCISIONAL HEMORRHOIDECTOMY     JOINT REPLACEMENT Bilateral    KNEE ARTHROSCOPY WITH LATERAL MENISECTOMY Right 07/07/2015   Procedure: KNEE ARTHROSCOPY WITH LATERAL MENISECTOMY, PARTIAL SYNOVECTOMY;  Surgeon: [name], MD;  Location: [facility];  Service: Orthopedics;  Laterality: Right;   LUMBAR LAMINECTOMY     PORTA CATH INSERTION N/A 01/07/2019   Procedure: PORTA CATH INSERTION;  Surgeon: [name], MD;  Location: [facility];  Service: Cardiovascular;  Laterality: N/A;   REDUCTION MAMMAPLASTY  1990   RIGHT OOPHORECTOMY     TOTAL HIP ARTHROPLASTY Left 05/01/2014   Dr. [name]   TOTAL HIP ARTHROPLASTY Right 08/05/2014   Procedure: TOTAL HIP ARTHROPLASTY ANTERIOR APPROACH;  Surgeon: [name], MD;  Location: [facility];  Service: Orthopedics;  Laterality: Right;   ULNAR NERVE TRANSPOSITION Right 02/11/2016   Procedure: ULNAR NERVE DECOMPRESSION/TRANSPOSITION;  Surgeon: [name], MD;  Location: [facility];  Service: Orthopedics;  Laterality: Right;   VIDEO BRONCHOSCOPY WITH ENDOBRONCHIAL ULTRASOUND Left 12/19/2018   Procedure: VIDEO BRONCHOSCOPY WITH ENDOBRONCHIAL ULTRASOUND, LEFT, SLEEP APNEA;  Surgeon: [name], MD;  Location: [facility];  Service: Thoracic;  Laterality: Left;   VIDEO BRONCHOSCOPY WITH ENDOBRONCHIAL ULTRASOUND Right 05/14/2021   Procedure: VIDEO BRONCHOSCOPY WITH ENDOBRONCHIAL ULTRASOUND;  Surgeon: Tyler Pita, MD;  Location: [facility];  Service: Pulmonary;  Laterality: Right;     Social History:   reports that she quit smoking about 14 years ago. Her smoking use included cigarettes. She has a 40.00  pack-year smoking history. She has never used smokeless tobacco. She reports that she does not drink alcohol and does not use drugs.   Family History:  Her family history includes Breast cancer in her cousin and paternal aunt; Diabetes in her father; Heart disease in her mother; Hypercholesterolemia in her mother; Hypertension in her mother; Stomach cancer in an other family member.   Allergies Allergies  Allergen Reactions   Aspirin Hives, Shortness Of Breath and Other (See Comments)    Difficulty breathing  Celebrex [Celecoxib] Shortness Of Breath   Morphine And Related Nausea And Vomiting and Swelling   Adhesive [Tape] Other (See Comments)    Took top layer of skin off when removed.   Clarithromycin Nausea And Vomiting   Codeine Nausea And Vomiting   Darvon [Propoxyphene Hcl] Nausea And Vomiting   Demerol [Meperidine] Nausea And Vomiting   Flonase [Fluticasone Propionate] Other (See Comments)    Fungal infection in nose   Simvastatin Other (See Comments)    "caused ulcers in mouth, and fever"   Talwin [Pentazocine] Nausea And Vomiting     Home Medications  Prior to Admission medications   Medication Sig Start Date End Date Taking? Authorizing Provider  fluconazole (DIFLUCAN) 100 MG tablet Take 1 tablet (100 mg total) by mouth daily for 7 days. 02/04/22 02/11/22 Yes Delman Kitten, MD  acetaminophen (TYLENOL) 500 MG tablet Take 500 mg by mouth 2 (two) times daily.    [provider]  albuterol (VENTOLIN HFA) 108 (90 Base) MCG/ACT inhaler Inhale 2 puffs into the lungs every 6 (six) hours as needed for wheezing or shortness of breath.    [provider]  Bioflavonoid Products (ESTER C PO) Take 1 tablet by mouth 3 (three) times daily. 500 mg    [provider]  Calcium-Magnesium-Zinc (CAL-MAG-ZINC PO) Take 1 tablet by mouth daily.    [provider]  carboxymethylcellulose (REFRESH PLUS) 0.5 % SOLN Place 1 drop into both eyes 3 (three) times daily as  needed (dry eyes).    [provider]  cetirizine (ZYRTEC) 5 MG tablet Take 1 tablet (5 mg total) by mouth daily. Patient not taking: Reported on 12/29/2021 04/17/18   Coral Spikes, DO  chlorhexidine (PERIDEX) 0.12 % solution SMARTSIG:By Mouth Patient not taking: Reported on 11/17/2021 09/28/21   [provider]  Cholecalciferol (EQL VITAMIN D3) 25 MCG (1000 UT) capsule Take 1,000 Units by mouth daily.    [provider]  Coenzyme Q10 (CO Q 10) 100 MG CAPS Take 100 mg by mouth daily.     [provider]  Cyanocobalamin (VITAMIN B-12) 2500 MCG SUBL Place 2,500 mcg under the tongue daily.    [provider]  cyclobenzaprine (FLEXERIL) 10 MG tablet Take 10 mg by mouth at bedtime.    [provider]  DHEA 25 MG CAPS Take 25 mg by mouth daily.    [provider]  ezetimibe (ZETIA) 10 MG tablet Take 5 mg by mouth in the morning and at bedtime. 10/26/18   [provider]  folic acid (FOLVITE) 557 MCG tablet Take 400 mcg by mouth daily.    [provider]  gabapentin (NEURONTIN) 100 MG capsule Take 100 mg by mouth 3 (three) times daily. Patient not taking: Reported on 10/06/2021    [provider]  Garlic 3220 MG CAPS Take 1,000 mg by mouth daily. Patient not taking: Reported on 12/29/2021    [provider]  Histamine Dihydrochloride (AUSTRALIAN DREAM ARTHRITIS) 0.025 % CREA Apply 1 application. topically 4 (four) times daily as needed (pain).    [provider]  HYDROcodone-acetaminophen (NORCO/VICODIN) 5-325 MG tablet 1-2 tabs po bid prn Patient not taking: Reported on 11/17/2021 12/09/18   Norval Gable, MD  levOCARNitine (L-CARNITINE) 250 MG TABS Take 1 tablet by mouth daily. With Chromium 250 mg    [provider]  meclizine (ANTIVERT) 25 MG tablet Take 1 tablet (25 mg total) by mouth every 6 (six) hours as needed for dizziness. Patient not taking:  Reported on 11/17/2021 06/11/19   Sindy Guadeloupe, MD  ondansetron (ZOFRAN) 8 MG tablet Take by mouth every 8 (eight) hours as needed for nausea or vomiting.    [provider]  pantoprazole (PROTONIX) 40 MG tablet Take 40 mg by mouth 2 (two) times daily.    [provider]  polyethylene glycol powder (GLYCOLAX/MIRALAX) 17 GM/SCOOP powder Take 17 g by mouth daily as needed for moderate constipation.    [provider]  pyridOXINE (VITAMIN B-6) 100 MG tablet Take 100 mg by mouth daily.    [provider]  Selenium 200 MCG CAPS Take 200 mcg by mouth daily.    [provider]  Sennosides (SENNA) 8.6 MG CAPS Take 1 capsule by mouth daily as needed (constipation).    [provider]  sucralfate (CARAFATE) 1 GM/10ML suspension Take 1 g by mouth daily. 02/13/19   [provider]  Tiotropium Bromide-Olodaterol (STIOLTO RESPIMAT) 2.5-2.5 MCG/ACT AERS Inhale 2 puffs into the lungs daily. 06/07/21   Tyler Pita, MD  tobramycin-dexamethasone [facility]) ophthalmic solution SMARTSIG:1 Drop(s) In Eye(s) 2-3 Times Daily PRN 08/26/21   [provider]  triamcinolone (NASACORT) 55 MCG/ACT AERO nasal inhaler Place 2 sprays into the nose daily.    [provider]  UBRELVY 100 MG TABS Take 100 mg by mouth daily as needed (migraines). 01/30/20   [provider]  Vitamin A 2400 MCG (8000 UT) CAPS Take 8,000 Units by mouth daily.    [provider]  Vitamin E 200 units TABS Take 200 Units by mouth daily.    [provider]  Wheat Dextrin (EQ FIBER POWDER PO) Take by mouth.    [provider]      [name], M.D.  [facility] [place]

## 2022-02-09 NOTE — Progress Notes (Signed)
Physical Therapy Treatment Patient Details Name: Jennifer Hodges MRN: [id number] DOB: 1945/08/10 Today's Date: 02/09/2022   History of Present Illness Pt is a 76 y/o F admitted on 02/04/22 after presenting with c/o worsening SOB & was found to have R sided pleural effusion. Pt is s/p thoracentesis on 11/24. 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 Able to increase ambulation distance with improved activity tolerance. One instance of spO2 dropping to 87% but able to recover to >89% within 10 seconds of standing rest break. Prefers to ambulate in hallway with RW but able to ambulate in room with no AD. No PT follow up recommended at this time.     Recommendations for follow up therapy are one component of a multi-disciplinary discharge planning process, led by the attending physician.  Recommendations may be updated based on patient status, additional functional criteria and insurance authorization.  Follow Up Recommendations  No PT follow up     Assistance Recommended at Discharge PRN  Patient can return home with the following Help with stairs or ramp for entrance;Assistance with cooking/housework   Equipment Recommendations  None recommended by PT    Recommendations for Other Services       Precautions / Restrictions Precautions Precautions: None     Mobility  Bed Mobility Overal bed mobility: Modified Independent                  Transfers Overall transfer level: Modified independent Equipment used: None                    Ambulation/Gait Ambulation/Gait assistance: Supervision Gait Distance (Feet): 360 Feet (+15) Assistive device: Rolling Lydia Toren (2 wheels), None Gait Pattern/deviations: Step-through pattern, Decreased stride length Gait velocity: decreased     General Gait Details:  supervision for safety. Preference to ambulate in hallway with RW but able to ambulate in room with no AD. x1 standing rest break during mobility   Stairs             Wheelchair Mobility    Modified Rankin (Stroke Patients Only)       Balance Overall balance assessment: Mild deficits observed, not formally tested                                          Cognition Arousal/Alertness: Awake/alert Behavior During Therapy: WFL for tasks assessed/performed, Impulsive Overall Cognitive Status: Within Functional Limits for tasks assessed                                          Exercises      General Comments General comments (skin integrity, edema, etc.): on RA throughout, spO2 dropped to 87% within 50' but quickly recovered to 89% within 10 seconds of standing rest break      Pertinent Vitals/Pain Pain Assessment Pain Assessment: No/denies pain    Home Living                          Prior Function            PT Goals (current goals can now be  found in the care plan section) Acute Rehab PT Goals PT Goal Formulation: With patient Time For Goal Achievement: 02/20/22 Potential to Achieve Goals: Good Progress towards PT goals: Progressing toward goals    Frequency    Min 2X/week      PT Plan Current plan remains appropriate    Co-evaluation              AM-PAC PT "6 Clicks" Mobility   Outcome Measure  Help needed turning from your back to your side while in a flat bed without using bedrails?: None Help needed moving from lying on your back to sitting on the side of a flat bed without using bedrails?: None Help needed moving to and from a bed to a chair (including a wheelchair)?: None Help needed standing up from a chair using your arms (e.g., wheelchair or bedside chair)?: None Help needed to walk in hospital room?: A Little Help needed climbing 3-5 steps with a railing? : A Little 6 Click Score:  22    End of Session   Activity Tolerance: Patient tolerated treatment well Patient left: in bed;with call bell/phone within reach;with family/visitor present Nurse Communication: Mobility status PT Visit Diagnosis: Muscle weakness (generalized) (M62.81)     Time: 9373-4287 PT Time Calculation (min) (ACUTE ONLY): 21 min  Charges:  $Therapeutic Activity: 8-22 mins                     Mekhai Venuto A. Gilford Rile PT, DPT [facility] - Acute Rehabilitation Services    Deone Leifheit A Darian Cansler 02/09/2022, 12:32 PM

## 2022-02-09 NOTE — Assessment & Plan Note (Signed)
Controlled for now

## 2022-02-09 NOTE — Assessment & Plan Note (Signed)
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 Has completed radiation therapy Follow-up with oncology Dr. Janese Banks at cancer center

## 2022-02-09 NOTE — Assessment & Plan Note (Signed)
Patient presents to the ER for evaluation of right-sided pleuritic chest pain, cough productive of blood-tinged phlegm, worsening shortness of breath associated with fever and chills. 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  Hold meds with liquid as able Seen by pulmonary s/p thoracentesis with pending cytology.  Repeat CT chest on 11/25 shows stability or improvement from before Initially treated with Zosyn and Zithromax and now switched to oral Levaquin Negative blood cultures till now --continue prednisone taper --continue Levaquin 3 more days.   --chest PT --Pulmonlogy follow up with Dr. [name] outpatient

## 2022-02-10 LAB — CHOLESTEROL, BODY FLUID: Cholesterol, Fluid: 79 mg/dL

## 2022-02-11 LAB — CULTURE, RESPIRATORY W GRAM STAIN: Culture: NORMAL

## 2022-02-15 ENCOUNTER — Ambulatory Visit
Admission: RE | Admit: 2022-02-15 | Discharge: 2022-02-15 | Disposition: A | Discharge status: Home / Self Care | Payer: Medicare Other | Source: Ambulatory Visit | Attending: Oncology | Admitting: Oncology

## 2022-02-15 DIAGNOSIS — C3412 Malignant neoplasm of upper lobe, left bronchus or lung: Secondary | ICD-10-CM | POA: Diagnosis not present

## 2022-02-15 MED ORDER — IOHEXOL 300 MG/ML  SOLN
100.0000 mL | Freq: Once | INTRAMUSCULAR | Status: AC | PRN
  Administered 2022-02-15: 100 mL via INTRAVENOUS

## 2022-02-22 MED FILL — Dexamethasone Sodium Phosphate Inj 100 MG/10ML: INTRAMUSCULAR | Qty: 1 | Status: AC

## 2022-02-23 ENCOUNTER — Encounter: Payer: Self-pay | Admitting: Oncology

## 2022-02-23 ENCOUNTER — Inpatient Hospital Stay (HOSPITAL_BASED_OUTPATIENT_CLINIC_OR_DEPARTMENT_OTHER): Payer: Medicare Other | Admitting: Oncology

## 2022-02-23 ENCOUNTER — Inpatient Hospital Stay: Payer: Medicare Other

## 2022-02-23 ENCOUNTER — Inpatient Hospital Stay: Payer: Medicare Other | Attending: Oncology

## 2022-02-23 VITALS — BP 125/72 | HR 98 | Temp 97.7°F | Wt 170.6 lb

## 2022-02-23 DIAGNOSIS — Z7189 Other specified counseling: Secondary | ICD-10-CM

## 2022-02-23 DIAGNOSIS — C3412 Malignant neoplasm of upper lobe, left bronchus or lung: Secondary | ICD-10-CM

## 2022-02-23 DIAGNOSIS — Z5111 Encounter for antineoplastic chemotherapy: Secondary | ICD-10-CM

## 2022-02-23 DIAGNOSIS — J90 Pleural effusion, not elsewhere classified: Secondary | ICD-10-CM | POA: Insufficient documentation

## 2022-02-23 LAB — CBC WITH DIFFERENTIAL/PLATELET
Abs Immature Granulocytes: 0.02 10*3/uL (ref 0.00–0.07)
Basophils Absolute: 0 10*3/uL (ref 0.0–0.1)
Basophils Relative: 1 %
Eosinophils Absolute: 0.2 10*3/uL (ref 0.0–0.5)
Eosinophils Relative: 2 %
HCT: 35.9 % — ABNORMAL LOW (ref 36.0–46.0)
Hemoglobin: 11.6 g/dL — ABNORMAL LOW (ref 12.0–15.0)
Immature Granulocytes: 0 %
Lymphocytes Relative: 13 %
Lymphs Abs: 0.8 10*3/uL (ref 0.7–4.0)
MCH: 30.4 pg (ref 26.0–34.0)
MCHC: 32.3 g/dL (ref 30.0–36.0)
MCV: 94.2 fL (ref 80.0–100.0)
Monocytes Absolute: 0.5 10*3/uL (ref 0.1–1.0)
Monocytes Relative: 8 %
Neutro Abs: 4.8 10*3/uL (ref 1.7–7.7)
Neutrophils Relative %: 76 %
Platelets: 228 10*3/uL (ref 150–400)
RBC: 3.81 MIL/uL — ABNORMAL LOW (ref 3.87–5.11)
RDW: 14 % (ref 11.5–15.5)
WBC: 6.4 10*3/uL (ref 4.0–10.5)
nRBC: 0 % (ref 0.0–0.2)

## 2022-02-23 LAB — COMPREHENSIVE METABOLIC PANEL
ALT: 17 U/L (ref 0–44)
AST: 25 U/L (ref 15–41)
Albumin: 3.5 g/dL (ref 3.5–5.0)
Alkaline Phosphatase: 79 U/L (ref 38–126)
Anion gap: 9 (ref 5–15)
BUN: 15 mg/dL (ref 8–23)
CO2: 24 mmol/L (ref 22–32)
Calcium: 8.9 mg/dL (ref 8.9–10.3)
Chloride: 105 mmol/L (ref 98–111)
Creatinine, Ser: 0.56 mg/dL (ref 0.44–1.00)
GFR, Estimated: >60 mL/min (ref >60)
Glucose, Bld: 102 mg/dL — ABNORMAL HIGH (ref 70–99)
Potassium: 4.4 mmol/L (ref 3.5–5.1)
Sodium: 138 mmol/L (ref 135–145)
Total Bilirubin: 0.3 mg/dL (ref 0.3–1.2)
Total Protein: 6.4 g/dL — ABNORMAL LOW (ref 6.5–8.1)

## 2022-02-23 NOTE — Progress Notes (Signed)
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  EGFR and Ros as well as MET testing negative. PD-L1 was 50%   Patient completed concurrent chemoradiation with weekly carbotaxol chemotherapy on 03/12/2019.  Maintenance durvalumab started in January 2021 after scan showed partial response.  Patient completed 1 year of maintenance durvalumab in February 2022   Patient was found to have enlarging mediastinal adenopathyOn subsequent CT scan in January 2023.  This was followed by a PET scan which showed hypermetabolic mediastinal and right hilar lymph nodes but no other evidence of disease elsewhere.  This was biopsied and was consistent with non-small cell lung cancer specifically adenocarcinoma.  Cells were positive for TTF-1 and Napsin A and negative for p40.   Patient completed concurrent chemoradiation with 4 cycles of carbo Alimta ending in May 2023.  Scan showed partial response.  Plan is for maintenance Alimta until progression or toxicity    Patient was hospitalized in November 2023 for acute on chronic hypoxic respiratory failure secondary to  healthcare associated pneumonia.  She had pleural effusions which were tapped and thoracentesis was negative for malignancy.  Interval history-patient is doing better since her hospital discharge.  She is not requiring oxygen during the day.  She has chronic cough at baseline but improved since hospitalization.  She presently complains of pain in her bilateral knees.  ECOG PS- 2 Pain scale- 3 Opioid associated constipation- no  Review of systems- Review of Systems  Constitutional:  Positive for malaise/fatigue. Negative for chills, fever and weight loss.  HENT:  Negative for congestion, ear discharge and nosebleeds.   Eyes:  Negative for blurred vision.  Respiratory:  Positive for cough. Negative for hemoptysis, sputum production, shortness of breath and wheezing.   Cardiovascular:  Negative for chest pain, palpitations, orthopnea and claudication.  Gastrointestinal:  Negative for abdominal pain, blood in stool, constipation, diarrhea, heartburn, melena, nausea and vomiting.  Genitourinary:  Negative for dysuria, flank pain, frequency, hematuria and urgency.  Musculoskeletal:  Positive for joint pain. Negative for back pain and myalgias.  Skin:  Negative for rash.  Neurological:  Negative for dizziness, tingling, focal weakness, seizures, weakness and headaches.  Endo/Heme/Allergies:  Does not bruise/bleed easily.  Psychiatric/Behavioral:  Negative for depression and suicidal ideas. The patient does not have insomnia.       Allergies  Allergen Reactions   Aspirin Hives, Shortness Of Breath and Other (See Comments)    Difficulty breathing   Celebrex [Celecoxib] Shortness Of Breath   Morphine And Related Nausea And Vomiting and Swelling   Adhesive [Tape] Other (See Comments)    Took top layer of skin off when removed.   Clarithromycin Nausea And Vomiting   Codeine Nausea And Vomiting  Darvon [Propoxyphene Hcl] Nausea And Vomiting   Demerol [Meperidine] Nausea And Vomiting   Flonase  [Fluticasone Propionate] Other (See Comments)    Fungal infection in nose   Simvastatin Other (See Comments)    "caused ulcers in mouth, and fever"   Talwin [Pentazocine] Nausea And Vomiting     Past Medical History:  Diagnosis Date   Anemia    Asthma    No Inhalers--Dr. [name] will order as needed   Bronchiectasis ([place])    mild   Chronic headaches     followed by [facility] migraines   COPD (chronic obstructive pulmonary disease) ([place])    DDD (degenerative disc disease), lumbar    Diverticulosis    Family history of adverse reaction to anesthesia    mother and sisters-PONV   Gall stones    history of   GERD (gastroesophageal reflux disease)    EGD 8/09- non bleeding erosive gastritis, documentd esophageal ulcerations.    Hiatal hernia    small   History of kidney stones    History of pneumonia    Hypercholesterolemia    IBS (irritable bowel syndrome)    Malignant neoplasm of upper lobe of left lung ([place]) 12/27/2018   Meniere disease    Murmur    Osteoarthritis    lumbar disc disease, left hip   Personal history of chemotherapy    Personal history of radiation therapy    Pneumonia 12/2020   PONV (postoperative nausea and vomiting)    Sleep apnea    uses cpap   Vertigo    Weakness of right side of body      Past Surgical History:  Procedure Laterality Date   ABDOMINAL HYSTERECTOMY  age 73   ANTERIOR CERVICAL DECOMP/DISCECTOMY FUSION N/A 02/24/2015   Procedure: CERVICAL FOUR-FIVE, CERVICAL FIVE-SIX, CERVICAL SIX-SEVEN ANTERIOR CERVICAL DECOMPRESSION/DISCECTOMY FUSION ;  Surgeon: [name], MD;  Location: [place] [facility];  Service: Neurosurgery;  Laterality: N/A;  C45 C56 C67 anterior cervical decompression with fusion interbody prosthesis plating and bonegraft   APPENDECTOMY     BACK SURGERY  1988   4th lumbar fusion   BLADDER SURGERY N/A    with vaginal wall repair   BREAST CYST ASPIRATION Bilateral    neg   BREAST SURGERY Bilateral    cyst  removed and reduction   CARDIAC CATHETERIZATION  2014   CARPAL TUNNEL RELEASE Right 02/11/2016   Procedure: CARPAL TUNNEL RELEASE;  Surgeon: [name], MD;  Location: [facility];  Service: Orthopedics;  Laterality: Right;   CATARACT EXTRACTION W/ INTRAOCULAR LENS IMPLANT Bilateral 2015   CHOLECYSTECTOMY     EUS N/A 05/31/2012   Procedure: UPPER ENDOSCOPIC ULTRASOUND (EUS) LINEAR;  Surgeon: [name], MD;  Location: [facility];  Service: Endoscopy;  Laterality: N/A;   EXCISIONAL HEMORRHOIDECTOMY     JOINT REPLACEMENT Bilateral    KNEE ARTHROSCOPY WITH LATERAL MENISECTOMY Right 07/07/2015   Procedure: KNEE ARTHROSCOPY WITH LATERAL MENISECTOMY, PARTIAL SYNOVECTOMY;  Surgeon: [name], MD;  Location: [facility];  Service: Orthopedics;  Laterality: Right;   LUMBAR LAMINECTOMY     PORTA CATH INSERTION N/A 01/07/2019   Procedure: PORTA CATH INSERTION;  Surgeon: [name], MD;  Location: [place] [facility];  Service: Cardiovascular;  Laterality: N/A;   REDUCTION MAMMAPLASTY  1990   RIGHT OOPHORECTOMY     TOTAL HIP ARTHROPLASTY Left 05/01/2014   Dr. [name]   TOTAL HIP ARTHROPLASTY Right 08/05/2014   Procedure: TOTAL HIP ARTHROPLASTY ANTERIOR APPROACH;  Surgeon: Legrand Como  Rudene Christians, MD;  Location: [facility];  Service: Orthopedics;  Laterality: Right;   ULNAR NERVE TRANSPOSITION Right 02/11/2016   Procedure: ULNAR NERVE DECOMPRESSION/TRANSPOSITION;  Surgeon: [name], MD;  Location: [facility];  Service: Orthopedics;  Laterality: Right;   VIDEO BRONCHOSCOPY WITH ENDOBRONCHIAL ULTRASOUND Left 12/19/2018   Procedure: VIDEO BRONCHOSCOPY WITH ENDOBRONCHIAL ULTRASOUND, LEFT, SLEEP APNEA;  Surgeon: [name], MD;  Location: [facility];  Service: Thoracic;  Laterality: Left;   VIDEO BRONCHOSCOPY WITH ENDOBRONCHIAL ULTRASOUND Right 05/14/2021   Procedure: VIDEO BRONCHOSCOPY WITH ENDOBRONCHIAL ULTRASOUND;  Surgeon: Tyler Pita, MD;  Location: [facility];  Service: Pulmonary;   Laterality: Right;    Social History   Socioeconomic History   Marital status: Married    Spouse name: Not on file   Number of children: Not on file   Years of education: Not on file   Highest education level: Not on file  Occupational History   Not on file  Tobacco Use   Smoking status: Former    Packs/day: 1.00    Years: 40.00    Total pack years: 40.00    Types: Cigarettes    Quit date: 06/13/2007    Years since quitting: 14.7   Smokeless tobacco: Never  Vaping Use   Vaping Use: Never used  Substance and Sexual Activity   Alcohol use: No    Alcohol/week: 0.0 standard drinks of alcohol   Drug use: No   Sexual activity: Not on file  Other Topics Concern   Not on file  Social History Narrative   Not on file   Social Determinants of Health   Financial Resource Strain: Not on file  Food Insecurity: No Food Insecurity (02/05/2022)   Hunger Vital Sign    Worried About Running Out of Food in the Last Year: Never true    Ran Out of Food in the Last Year: Never true  Transportation Needs: No Transportation Needs (02/04/2022)   PRAPARE - Hydrologist (Medical): No    Lack of Transportation (Non-Medical): No  Physical Activity: Not on file  Stress: Not on file  Social Connections: Not on file  Intimate Partner Violence: Not At Risk (02/04/2022)   Humiliation, Afraid, Rape, and Kick questionnaire    Fear of Current or Ex-Partner: No    Emotionally Abused: No    Physically Abused: No    Sexually Abused: No    Family History  Problem Relation Age of Onset   Heart disease Mother        s/p stent   Hypertension Mother    Hypercholesterolemia Mother    Diabetes Father    Stomach cancer Other        uncle   Breast cancer Cousin    Breast cancer Paternal Aunt      Current Outpatient Medications:    acetaminophen (TYLENOL) 500 MG tablet, Take 500 mg by mouth 2 (two) times daily., Disp: , Rfl:    albuterol (VENTOLIN HFA) 108 (90 Base)  MCG/ACT inhaler, Inhale 2 puffs into the lungs every 6 (six) hours as needed for wheezing or shortness of breath., Disp: , Rfl:    Bioflavonoid Products (ESTER C PO), Take 1 tablet by mouth 3 (three) times daily. 500 mg, Disp: , Rfl:    Calcium-Magnesium-Zinc (CAL-MAG-ZINC PO), Take 1 tablet by mouth daily., Disp: , Rfl:    carboxymethylcellulose (REFRESH PLUS) 0.5 % SOLN, Place 1 drop into both eyes 3 (three) times daily as needed (dry eyes)., Disp: , Rfl:  Cholecalciferol (EQL VITAMIN D3) 25 MCG (1000 UT) capsule, Take 1,000 Units by mouth daily., Disp: , Rfl:    Coenzyme Q10 (CO Q 10) 100 MG CAPS, Take 100 mg by mouth daily. , Disp: , Rfl:    Cyanocobalamin (VITAMIN B-12) 2500 MCG SUBL, Place 2,500 mcg under the tongue daily., Disp: , Rfl:    cyclobenzaprine (FLEXERIL) 10 MG tablet, Take 10 mg by mouth at bedtime., Disp: , Rfl:    DHEA 25 MG CAPS, Take 25 mg by mouth daily., Disp: , Rfl:    ezetimibe (ZETIA) 10 MG tablet, Take 5 mg by mouth in the morning and at bedtime., Disp: , Rfl:    folic acid (FOLVITE) 009 MCG tablet, Take 400 mcg by mouth daily., Disp: , Rfl:    Histamine Dihydrochloride (AUSTRALIAN DREAM ARTHRITIS) 0.025 % CREA, Apply 1 application. topically 4 (four) times daily as needed (pain)., Disp: , Rfl:    HYDROcodone-acetaminophen (NORCO/VICODIN) 5-325 MG tablet, Take 1 tablet by mouth at bedtime., Disp: 30 tablet, Rfl: 0   levOCARNitine (L-CARNITINE) 250 MG TABS, Take 1 tablet by mouth daily. With Chromium 250 mg, Disp: , Rfl:    ondansetron (ZOFRAN) 8 MG tablet, Take by mouth every 8 (eight) hours as needed for nausea or vomiting., Disp: , Rfl:    pantoprazole (PROTONIX) 40 MG tablet, Take 40 mg by mouth 2 (two) times daily., Disp: , Rfl:    polyethylene glycol powder (GLYCOLAX/MIRALAX) 17 GM/SCOOP powder, Take 17 g by mouth daily as needed for moderate constipation., Disp: , Rfl:    pyridOXINE (VITAMIN B-6) 100 MG tablet, Take 100 mg by mouth daily., Disp: , Rfl:     Selenium 200 MCG CAPS, Take 200 mcg by mouth daily., Disp: , Rfl:    Sennosides (SENNA) 8.6 MG CAPS, Take 1 capsule by mouth daily as needed (constipation)., Disp: , Rfl:    sucralfate (CARAFATE) 1 GM/10ML suspension, Take 1 g by mouth daily., Disp: , Rfl:    Tiotropium Bromide-Olodaterol (STIOLTO RESPIMAT) 2.5-2.5 MCG/ACT AERS, Inhale 2 puffs into the lungs daily., Disp: 4 g, Rfl: 10   triamcinolone (NASACORT) 55 MCG/ACT AERO nasal inhaler, Place 2 sprays into the nose daily., Disp: , Rfl:    UBRELVY 100 MG TABS, Take 100 mg by mouth daily as needed (migraines)., Disp: , Rfl:    Vitamin A 2400 MCG (8000 UT) CAPS, Take 8,000 Units by mouth daily., Disp: , Rfl:    Vitamin E 200 units TABS, Take 200 Units by mouth daily., Disp: , Rfl:    Wheat Dextrin (EQ FIBER POWDER PO), Take by mouth., Disp: , Rfl:   Physical exam: There were no vitals filed for this visit. Physical Exam Cardiovascular:     Rate and Rhythm: Normal rate and regular rhythm.     Heart sounds: Normal heart sounds.  Pulmonary:     Effort: Pulmonary effort is normal.     Breath sounds: Normal breath sounds.  Abdominal:     General: Bowel sounds are normal.     Palpations: Abdomen is soft.  Musculoskeletal:     Comments: Trace bilateral pitting edema.  Left knee appears mildly more swollen than the right.  There is no local warmth or erythema.  No significant effusion noted  Skin:    General: Skin is warm and dry.  Neurological:     Mental Status: She is alert and oriented to person, place, and time.         Latest Ref Rng & Units 02/09/2022  5:25 AM  CMP  Glucose 70 - 99 mg/dL 94   BUN 8 - 23 mg/dL 9   Creatinine 0.44 - 1.00 mg/dL 0.68   Sodium 135 - 145 mmol/L 136   Potassium 3.5 - 5.1 mmol/L 3.6   Chloride 98 - 111 mmol/L 106   CO2 22 - 32 mmol/L 24   Calcium 8.9 - 10.3 mg/dL 9.3       Latest Ref Rng & Units 02/09/2022    5:25 AM  CBC  WBC 4.0 - 10.5 K/uL 3.3   Hemoglobin 12.0 - 15.0 g/dL 9.9    Hematocrit 36.0 - 46.0 % 30.9   Platelets 150 - 400 K/uL 382     No images are attached to the encounter.  CT CHEST ABDOMEN PELVIS W CONTRAST  Result Date: 02/15/2022 CLINICAL DATA:  Metastatic lung cancer in a 76 year old female, follow-up assessment. * Tracking Code: BO * EXAM: CT CHEST, ABDOMEN, AND PELVIS WITH CONTRAST TECHNIQUE: Multidetector CT imaging of the chest, abdomen and pelvis was performed following the standard protocol during bolus administration of intravenous contrast. RADIATION DOSE REDUCTION: This exam was performed according to the departmental dose-optimization program which includes automated exposure control, adjustment of the mA and/or kV according to patient size and/or use of iterative reconstruction technique. CONTRAST:  144m OMNIPAQUE IOHEXOL 300 MG/ML  SOLN COMPARISON:  February 05, 2022 CT of the chest. November 11, 2021 CT of the chest, abdomen and pelvis. FINDINGS: CT CHEST FINDINGS Cardiovascular: RIGHT IJ Port-A-Cath terminates at the caval to atrial junction. Calcified and noncalcified aortic atherosclerotic plaque in the thoracic aorta without aneurysmal dilation. Normal caliber of central pulmonary vessels. Small pericardial effusion similar to previous imaging no frank nodularity. Normal heart size. Mediastinum/Nodes: Patulous esophagus similar to prior studies. No discrete adenopathy about the RIGHT or LEFT hilum or within the mediastinum. No axillary lymphadenopathy. No thoracic inlet lymphadenopathy. Lungs/Pleura: Volume loss and perihilar post treatment changes in the LEFT chest are similar. Ground-glass, septal thickening, pleural effusion and volume loss about the RIGHT hilum and central chest similar to previous imaging as well. Musculoskeletal: No chest wall mass. See below for full musculoskeletal details. CT ABDOMEN PELVIS FINDINGS Hepatobiliary: No focal, suspicious hepatic lesion. Portal vein is patent. Biliary duct dilation is unchanged following  cholecystectomy. Pancreas: Normal, without mass, inflammation or ductal dilatation. Spleen: Normal. Adrenals/Urinary Tract: Adrenal glands are normal. Symmetric renal enhancement without signs of hydronephrosis or suspicious renal lesion. Urinary bladder is obscured by abundant streak artifact about the pelvis due to bilateral hip arthroplasty changes. Stomach/Bowel: Moderate stool in the colon. Pelvic bowel loops with limited evaluation due to streak artifact from hip arthroplasty changes. Query mild thickening of the ascending colon and or under distension with mildly indistinct appearance of the wall the colon in this area. No signs of upstream obstruction. No pneumatosis. Vascular/Lymphatic: Aortic atherosclerosis. No sign of aneurysm. Smooth contour of the IVC. There is no gastrohepatic or hepatoduodenal ligament lymphadenopathy. No retroperitoneal or mesenteric lymphadenopathy. No pelvic sidewall lymphadenopathy. Reproductive: No pelvic mass. Area of about the pelvis is obscured by streak artifact from hip arthroplasty changes. Other: No ascites Musculoskeletal: No acute bone finding. No destructive bone process. Spinal degenerative changes. Post bilateral hip arthroplasty. IMPRESSION: 1. Post treatment changes in the LEFT and RIGHT chest similar to previous imaging, evolving post radiation changes in the RIGHT chest remain associated with small RIGHT-sided pleural effusion. 2. Query mild thickening of the ascending colon and or under distension with mildly indistinct appearance of the wall  the colon in this area. Correlate with any signs of or risk factors for colitis. Consider correlation with recent colonoscopy as well and follow-up colonoscopy if not recently performed. 3. Query developing constipation. 4. No signs of metastatic disease to the abdomen or pelvis. 5. Aortic atherosclerosis. Aortic Atherosclerosis (ICD10-I70.0). Electronically Signed   By: Zetta Bills M.D.   On: 02/15/2022 16:51   DG  Chest 2 View  Result Date: 02/09/2022 CLINICAL DATA:  Shortness of breath, chest pain.  Lung cancer. EXAM: CHEST - 2 VIEW COMPARISON:  02/04/2022 and CT chest 02/05/2022. FINDINGS: Right IJ Port-A-Cath terminates in the SVC. Trachea is midline. Heart size is grossly stable. Architectural distortion in the perihilar regions bilaterally, right greater than left, compatible with radiation therapy. Associated volume loss in the right hemithorax with an elevated right hemidiaphragm, stable. No definite new airspace consolidation. No definite pleural fluid. IMPRESSION: Post radiation changes in the lungs bilaterally. No definite superimposed acute process. Electronically Signed   By: Lorin Picket M.D.   On: 02/09/2022 08:28   CT CHEST WO CONTRAST  Result Date: 02/05/2022 CLINICAL DATA:  Non-small cell lung cancer; completed chemoradiation May 2023; * Tracking Code: BO * EXAM: CT CHEST WITHOUT CONTRAST TECHNIQUE: Multidetector CT imaging of the chest was performed following the standard protocol without IV contrast. RADIATION DOSE REDUCTION: This exam was performed according to the departmental dose-optimization program which includes automated exposure control, adjustment of the mA and/or kV according to patient size and/or use of iterative reconstruction technique. COMPARISON:  Chest CT dated February 04, 2022 FINDINGS: Cardiovascular: Normal heart size. Small pericardial effusion, similar to prior exam. Mitral annular calcifications. Moderate coronary artery calcifications. Normal caliber thoracic aorta with moderate calcified plaque. Mediastinum/Nodes: Esophagus is unremarkable. No pathologically enlarged lymph nodes seen in the chest. Lungs/Pleura: Central airways are patent. Moderate centrilobular emphysema. Evolving right perihilar postradiation change with decreased ground-glass opacities and more linear confluent consolidations about the right hilum. Stable left perihilar postradiation change. Small  right pleural effusion, decreased in size when compared with prior exam. New trace left pleural effusion. Stable solid right lower lobe pulmonary nodule measuring 4 mm on series 3, image 96. No new or enlarging pulmonary nodules Upper Abdomen: No acute abnormality. Musculoskeletal: No chest wall mass or suspicious bone lesions identified. IMPRESSION: 1. Evolving right perihilar postradiation and stable left perihilar postradiation change. No evidence of progressive metastatic disease in the chest. 2. Small right pleural effusion, decreased in size when compared with prior exam. 3. New trace left pleural effusion. 4. Aortic Atherosclerosis (ICD10-I70.0) and Emphysema (ICD10-J43.9). Electronically Signed   By: Yetta Glassman M.D.   On: 02/05/2022 12:57   DG Chest Port 1 View  Result Date: 02/04/2022 CLINICAL DATA:  Status post right thoracentesis. EXAM: PORTABLE CHEST 1 VIEW COMPARISON:  02/04/2022, earlier same day FINDINGS: 1843 hours. No evidence for right-sided pneumothorax status post thoracentesis. There is persistent right base collapse/consolidation with probable residual right pleural effusion. Architectural distortion/scarring noted right mid lung. Minimal atelectasis at the left base is stable. The cardiopericardial silhouette is within normal limits for size. The visualized bony structures of the thorax are unremarkable. Right Port-A-Cath again noted. IMPRESSION: 1. No evidence for right pneumothorax status post thoracentesis. 2. Persistent right base collapse/consolidation with probable residual right pleural effusion. Electronically Signed   By: Misty Stanley M.D.   On: 02/04/2022 18:56   CT Angio Chest PE W and/or Wo Contrast  Result Date: 02/04/2022 CLINICAL DATA:  A 76 year old female with suspected pulmonary embolism.  Patient also with history of metastatic lung cancer. * Tracking Code: BO * EXAM: CT ANGIOGRAPHY CHEST WITH CONTRAST TECHNIQUE: Multidetector CT imaging of the chest was  performed using the standard protocol during bolus administration of intravenous contrast. Multiplanar CT image reconstructions and MIPs were obtained to evaluate the vascular anatomy. RADIATION DOSE REDUCTION: This exam was performed according to the departmental dose-optimization program which includes automated exposure control, adjustment of the mA and/or kV according to patient size and/or use of iterative reconstruction technique. CONTRAST:  16m OMNIPAQUE IOHEXOL 350 MG/ML SOLN COMPARISON:  CT of the chest abdomen and pelvis dated November 11, 2021. FINDINGS: Cardiovascular: RIGHT sided Port-A-Cath terminates at the caval to atrial junction. Heart size is normal. There is a small pericardial effusion without nodularity that is unchanged. Aortic atherosclerosis both calcified and noncalcified throughout the thoracic aorta. Aorta displays in normal caliber without gross abnormality on the study optimized for pulmonary arterial assessment. Main pulmonary artery is opacified to 416 Hounsfield units. The study is negative for pulmonary embolism. Mediastinum/Nodes: No signs of discrete adenopathy in the mediastinum. RIGHT hilar fullness is increased and is contiguous with consolidative changes in the RIGHT chest, see below. There is no discrete mass lesion or central adenopathy to explain airspace process in the RIGHT chest. Post treatment changes about the LEFT hilum are similar to previous imaging. Esophagus is mildly patulous. Lungs/Pleura: Signs of RIGHT middle lobe consolidation that has developed since previous imaging where there was mainly ground-glass about the central RIGHT chest. Ground-glass and septal thickening seen throughout aerated portions of the RIGHT chest. Of note the patient had RIGHT mediastinal and hilar recurrence identified in early 2023 which was subsequently treated. Initial therapy occurred about the LEFT hilum which continues to show post treatment changes which are not changed compared  to August of 2023. A small to moderate RIGHT-sided pleural effusion is partially loculated towards the RIGHT lung apex and is increased in volume also loculated along the anterior chest. Mild irregularity of the RIGHT mainstem bronchus with potential material within the lumen. No large volume material in the trachea. Slit like appearance of the LEFT mainstem bronchus. No LEFT-sided effusion. Upper Abdomen: Imaged portions the liver, pancreas, spleen, adrenal glands and kidneys are unremarkable. No acute gastrointestinal findings to the extent evaluated. No upper abdominal lymphadenopathy. Musculoskeletal: No acute bone finding. No destructive bone process. Spinal degenerative changes. Review of the MIP images confirms the above findings. IMPRESSION: 1. Negative for pulmonary embolism. 2. Signs of RIGHT middle lobe consolidation that has developed since previous imaging where there was mainly ground-glass about the central RIGHT chest. Findings also associated with increasing pleural fluid with loculated appearance in the LEFT chest but without signs of pleural enhancement at this time. Correlate with any symptoms of pneumonia. Findings could also reflect pneumonitis and should be correlated with any ongoing therapy. 3. Slit like appearance of mainstem bronchi raising the question of bronchomalacia. 4. Material in RIGHT lower lobe bronchi may represent aspirated or inspissated secretions. Correlate with any risk factors for or history of aspiration. There is no large volume material in the trachea or LEFT mainstem bronchus. 5. Stable appearance of post treatment changes about the LEFT hilum. Aortic Atherosclerosis (ICD10-I70.0). Electronically Signed   By: GZetta BillsM.D.   On: 02/04/2022 15:26   DG Chest 2 View  Result Date: 02/04/2022 CLINICAL DATA:  Shortness of breath. EXAM: CHEST - 2 VIEW COMPARISON:  CT examination dated November 11, 2021 FINDINGS: The heart size and mediastinal contours are  within  normal limits. Scarring with bronchiectasis in the right lower lobe with volume loss and elevation of the right hemidiaphragm, unchanged. Small right pleural effusion. There also mild atelectasis/fibrotic changes at the left lung base. Right IJ access MediPort with distal tip in the SVC. Anterior cervical discectomy and fusion hardware. IMPRESSION: 1. Scarring with bronchiectasis in the right lower lobe with volume loss and elevation of the right hemidiaphragm, unchanged. This may represent postsurgical changes/treatment response. 2. Small right pleural effusion, underlying infectious/inflammatory process can not be excluded. Electronically Signed   By: Keane Police D.O.   On: 02/04/2022 10:47     Assessment and plan- Patient is a 76 y.o. 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  She is here for on treatment assessment prior to cycle 8 of maintenance Alimta  I have reviewed her recent CT chest abdomen pelvis images independently and discussed findings with the patient which did not show any evidence of progressive disease.  There was evidence of pleural effusion which was tapped and thoracentesis was negative for malignancy.  At this time there is no clear evidence of progression.  However her performance status is limited by her underlying lung issues.  She does have baselineEmphysema and symptoms of chronic bronchitis leading to repeated episodes of pneumonia requiring antibiotics.  She also has likely underlying sleep apnea for which she follows up with cardiology.  I am planning to give her a break from chemotherapy due to her ongoing cardiac and lung issues.  I will see her back in early March 2024 after repeat scans.  I will consider restarting Alimta upon progression.   Visit Diagnosis 1. Encounter for antineoplastic chemotherapy   2. Malignant neoplasm of upper lobe of left lung ([place])   3. Goals of care, counseling/discussion      Dr. Randa Evens, MD, MPH [facility] at [facility] [phone] 02/23/2022 9:26 AM

## 2022-02-24 ENCOUNTER — Other Ambulatory Visit: Payer: Self-pay | Admitting: Gerontology

## 2022-02-24 DIAGNOSIS — Z1231 Encounter for screening mammogram for malignant neoplasm of breast: Secondary | ICD-10-CM

## 2022-03-02 ENCOUNTER — Ambulatory Visit: Payer: Medicare Other

## 2022-03-02 ENCOUNTER — Ambulatory Visit: Payer: Medicare Other | Admitting: Oncology

## 2022-03-02 ENCOUNTER — Other Ambulatory Visit: Payer: Medicare Other

## 2022-03-08 ENCOUNTER — Ambulatory Visit
Admission: RE | Admit: 2022-03-08 | Discharge: 2022-03-08 | Disposition: A | Discharge status: Home / Self Care | Payer: Medicare Other | Source: Ambulatory Visit | Attending: Gerontology | Admitting: Gerontology

## 2022-03-08 DIAGNOSIS — Z1231 Encounter for screening mammogram for malignant neoplasm of breast: Secondary | ICD-10-CM | POA: Diagnosis not present

## 2022-03-15 ENCOUNTER — Telehealth: Payer: Self-pay | Admitting: Oncology

## 2022-03-15 MED FILL — Dexamethasone Sodium Phosphate Inj 100 MG/10ML: INTRAMUSCULAR | Qty: 1 | Status: AC

## 2022-03-15 NOTE — Telephone Encounter (Signed)
Left VM for patient to let her know that Dr. Janese Banks would like her to hold off on chemo until after her CT scan in March. All appointments on 1/3 have been cancelled. Left direct ext for her to call back with any questions or concerns.

## 2022-03-16 ENCOUNTER — Ambulatory Visit: Payer: Medicare Other

## 2022-03-16 ENCOUNTER — Inpatient Hospital Stay: Payer: Medicare Other | Admitting: Oncology

## 2022-03-16 ENCOUNTER — Inpatient Hospital Stay: Payer: Medicare Other

## 2022-03-16 ENCOUNTER — Ambulatory Visit: Payer: Medicare Other | Admitting: Oncology

## 2022-03-16 ENCOUNTER — Other Ambulatory Visit: Payer: Medicare Other

## 2022-03-29 ENCOUNTER — Telehealth: Payer: Self-pay | Admitting: *Deleted

## 2022-03-29 NOTE — Telephone Encounter (Signed)
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 She wants Dr Assunta Gambles input on what she should do. She does report that the area itches and hurts. She was made aware that Dr Smith Robert is out of the office until Thursday. And so she asked my opinion and I told her that I would go ahead and get it done before she has to start chemotherapy again and to get it over with. She stated that she is going to call and schedule to get it done

## 2022-03-31 NOTE — Telephone Encounter (Signed)
Yes she should proceed with biopsy

## 2022-04-04 ENCOUNTER — Telehealth: Payer: Self-pay | Admitting: *Deleted

## 2022-04-04 MED ORDER — FLUCONAZOLE 150 MG PO TABS: 150.0000 mg | ORAL_TABLET | Freq: Once | ORAL | 0 refills | Status: AC

## 2022-04-04 NOTE — Telephone Encounter (Signed)
Patient called reporting that she has thrush again and is asking for another dose of Diflucan 150 mg one dose

## 2022-04-04 NOTE — Telephone Encounter (Signed)
Ok to do that 

## 2022-04-05 ENCOUNTER — Emergency Department: Payer: Medicare Other

## 2022-04-05 ENCOUNTER — Ambulatory Visit
Admission: EM | Admit: 2022-04-05 | Discharge: 2022-04-05 | Payer: Medicare Other | Attending: Emergency Medicine | Admitting: Emergency Medicine

## 2022-04-05 ENCOUNTER — Inpatient Hospital Stay
Admission: EM | Admit: 2022-04-05 | Discharge: 2022-04-07 | DRG: 194 | Disposition: A | Discharge status: Home / Self Care | Payer: Medicare Other | Attending: Internal Medicine | Admitting: Internal Medicine

## 2022-04-05 ENCOUNTER — Other Ambulatory Visit: Payer: Self-pay

## 2022-04-05 ENCOUNTER — Encounter: Payer: Self-pay | Admitting: Emergency Medicine

## 2022-04-05 DIAGNOSIS — Z981 Arthrodesis status: Secondary | ICD-10-CM

## 2022-04-05 DIAGNOSIS — R0602 Shortness of breath: Secondary | ICD-10-CM | POA: Diagnosis present

## 2022-04-05 DIAGNOSIS — J189 Pneumonia, unspecified organism: Principal | ICD-10-CM | POA: Diagnosis present

## 2022-04-05 DIAGNOSIS — Z87891 Personal history of nicotine dependence: Secondary | ICD-10-CM | POA: Diagnosis not present

## 2022-04-05 DIAGNOSIS — Z90710 Acquired absence of both cervix and uterus: Secondary | ICD-10-CM

## 2022-04-05 DIAGNOSIS — Z66 Do not resuscitate: Secondary | ICD-10-CM | POA: Diagnosis not present

## 2022-04-05 DIAGNOSIS — C3412 Malignant neoplasm of upper lobe, left bronchus or lung: Secondary | ICD-10-CM | POA: Diagnosis present

## 2022-04-05 DIAGNOSIS — Z9989 Dependence on other enabling machines and devices: Secondary | ICD-10-CM

## 2022-04-05 DIAGNOSIS — M1612 Unilateral primary osteoarthritis, left hip: Secondary | ICD-10-CM | POA: Diagnosis present

## 2022-04-05 DIAGNOSIS — J441 Chronic obstructive pulmonary disease with (acute) exacerbation: Secondary | ICD-10-CM | POA: Diagnosis present

## 2022-04-05 DIAGNOSIS — Z800 Family history of malignant neoplasm of digestive organs: Secondary | ICD-10-CM

## 2022-04-05 DIAGNOSIS — I10 Essential (primary) hypertension: Secondary | ICD-10-CM | POA: Diagnosis present

## 2022-04-05 DIAGNOSIS — R062 Wheezing: Secondary | ICD-10-CM

## 2022-04-05 DIAGNOSIS — J3800 Paralysis of vocal cords and larynx, unspecified: Secondary | ICD-10-CM | POA: Diagnosis present

## 2022-04-05 DIAGNOSIS — Z1152 Encounter for screening for COVID-19: Secondary | ICD-10-CM | POA: Diagnosis not present

## 2022-04-05 DIAGNOSIS — Z8701 Personal history of pneumonia (recurrent): Secondary | ICD-10-CM | POA: Diagnosis not present

## 2022-04-05 DIAGNOSIS — R079 Chest pain, unspecified: Secondary | ICD-10-CM

## 2022-04-05 DIAGNOSIS — Z885 Allergy status to narcotic agent status: Secondary | ICD-10-CM

## 2022-04-05 DIAGNOSIS — K589 Irritable bowel syndrome without diarrhea: Secondary | ICD-10-CM | POA: Diagnosis present

## 2022-04-05 DIAGNOSIS — Z87442 Personal history of urinary calculi: Secondary | ICD-10-CM

## 2022-04-05 DIAGNOSIS — Z888 Allergy status to other drugs, medicaments and biological substances status: Secondary | ICD-10-CM

## 2022-04-05 DIAGNOSIS — T66XXXS Radiation sickness, unspecified, sequela: Secondary | ICD-10-CM

## 2022-04-05 DIAGNOSIS — Z8249 Family history of ischemic heart disease and other diseases of the circulatory system: Secondary | ICD-10-CM | POA: Diagnosis not present

## 2022-04-05 DIAGNOSIS — E7800 Pure hypercholesterolemia, unspecified: Secondary | ICD-10-CM | POA: Diagnosis present

## 2022-04-05 DIAGNOSIS — Z833 Family history of diabetes mellitus: Secondary | ICD-10-CM

## 2022-04-05 DIAGNOSIS — Z9889 Other specified postprocedural states: Secondary | ICD-10-CM

## 2022-04-05 DIAGNOSIS — M47896 Other spondylosis, lumbar region: Secondary | ICD-10-CM | POA: Diagnosis present

## 2022-04-05 DIAGNOSIS — Z83438 Family history of other disorder of lipoprotein metabolism and other lipidemia: Secondary | ICD-10-CM

## 2022-04-05 DIAGNOSIS — Z79899 Other long term (current) drug therapy: Secondary | ICD-10-CM

## 2022-04-05 DIAGNOSIS — Z881 Allergy status to other antibiotic agents status: Secondary | ICD-10-CM

## 2022-04-05 DIAGNOSIS — Z85820 Personal history of malignant melanoma of skin: Secondary | ICD-10-CM | POA: Diagnosis not present

## 2022-04-05 DIAGNOSIS — J209 Acute bronchitis, unspecified: Secondary | ICD-10-CM | POA: Diagnosis present

## 2022-04-05 DIAGNOSIS — K219 Gastro-esophageal reflux disease without esophagitis: Secondary | ICD-10-CM | POA: Diagnosis present

## 2022-04-05 DIAGNOSIS — G4730 Sleep apnea, unspecified: Secondary | ICD-10-CM | POA: Diagnosis present

## 2022-04-05 DIAGNOSIS — Z803 Family history of malignant neoplasm of breast: Secondary | ICD-10-CM | POA: Diagnosis not present

## 2022-04-05 DIAGNOSIS — Z886 Allergy status to analgesic agent status: Secondary | ICD-10-CM

## 2022-04-05 DIAGNOSIS — J90 Pleural effusion, not elsewhere classified: Secondary | ICD-10-CM | POA: Diagnosis present

## 2022-04-05 DIAGNOSIS — J440 Chronic obstructive pulmonary disease with acute lower respiratory infection: Secondary | ICD-10-CM | POA: Diagnosis present

## 2022-04-05 DIAGNOSIS — Z9049 Acquired absence of other specified parts of digestive tract: Secondary | ICD-10-CM

## 2022-04-05 LAB — COMPREHENSIVE METABOLIC PANEL
ALT: 11 U/L (ref 0–44)
AST: 23 U/L (ref 15–41)
Albumin: 3.9 g/dL (ref 3.5–5.0)
Alkaline Phosphatase: 91 U/L (ref 38–126)
Anion gap: 11 (ref 5–15)
BUN: 12 mg/dL (ref 8–23)
CO2: 26 mmol/L (ref 22–32)
Calcium: 9.4 mg/dL (ref 8.9–10.3)
Chloride: 102 mmol/L (ref 98–111)
Creatinine, Ser: 0.53 mg/dL (ref 0.44–1.00)
GFR, Estimated: >60 mL/min (ref >60)
Glucose, Bld: 137 mg/dL — ABNORMAL HIGH (ref 70–99)
Potassium: 3.8 mmol/L (ref 3.5–5.1)
Sodium: 139 mmol/L (ref 135–145)
Total Bilirubin: 0.7 mg/dL (ref 0.3–1.2)
Total Protein: 6.6 g/dL (ref 6.5–8.1)

## 2022-04-05 LAB — CBC WITH DIFFERENTIAL/PLATELET
Abs Immature Granulocytes: 0.01 10*3/uL (ref 0.00–0.07)
Basophils Absolute: 0 10*3/uL (ref 0.0–0.1)
Basophils Relative: 1 %
Eosinophils Absolute: 0.2 10*3/uL (ref 0.0–0.5)
Eosinophils Relative: 5 %
HCT: 40.3 % (ref 36.0–46.0)
Hemoglobin: 13 g/dL (ref 12.0–15.0)
Immature Granulocytes: 0 %
Lymphocytes Relative: 15 %
Lymphs Abs: 0.7 10*3/uL (ref 0.7–4.0)
MCH: 29.7 pg (ref 26.0–34.0)
MCHC: 32.3 g/dL (ref 30.0–36.0)
MCV: 92.2 fL (ref 80.0–100.0)
Monocytes Absolute: 0.3 10*3/uL (ref 0.1–1.0)
Monocytes Relative: 8 %
Neutro Abs: 3.2 10*3/uL (ref 1.7–7.7)
Neutrophils Relative %: 71 %
Platelets: 294 10*3/uL (ref 150–400)
RBC: 4.37 MIL/uL (ref 3.87–5.11)
RDW: 13.1 % (ref 11.5–15.5)
WBC: 4.4 10*3/uL (ref 4.0–10.5)
nRBC: 0 % (ref 0.0–0.2)

## 2022-04-05 LAB — PROTIME-INR
INR: 1.1 (ref 0.8–1.2)
Prothrombin Time: 13.8 seconds (ref 11.4–15.2)

## 2022-04-05 LAB — TROPONIN I (HIGH SENSITIVITY)
Troponin I (High Sensitivity): 5 ng/L (ref <18)
Troponin I (High Sensitivity): 6 ng/L (ref <18)

## 2022-04-05 LAB — BRAIN NATRIURETIC PEPTIDE: B Natriuretic Peptide: 115.4 pg/mL — ABNORMAL HIGH (ref 0.0–100.0)

## 2022-04-05 LAB — RESP PANEL BY RT-PCR (RSV, FLU A&B, COVID)  RVPGX2
Influenza A by PCR: NEGATIVE
Influenza B by PCR: NEGATIVE
Resp Syncytial Virus by PCR: NEGATIVE
SARS Coronavirus 2 by RT PCR: NEGATIVE

## 2022-04-05 LAB — PROCALCITONIN: Procalcitonin: <0.1 ng/mL

## 2022-04-05 MED ORDER — SODIUM CHLORIDE 0.9 % IV SOLN
500.0000 mg | Freq: Once | INTRAVENOUS | Status: AC
  Administered 2022-04-05: 500 mg via INTRAVENOUS
  Filled 2022-04-05: qty 5

## 2022-04-05 MED ORDER — OXYCODONE-ACETAMINOPHEN 5-325 MG PO TABS
1.0000 | ORAL_TABLET | Freq: Once | ORAL | Status: AC
  Administered 2022-04-06: 1 via ORAL
  Filled 2022-04-05: qty 1

## 2022-04-05 MED ORDER — PREDNISONE 20 MG PO TABS
60.0000 mg | ORAL_TABLET | Freq: Once | ORAL | Status: AC
  Administered 2022-04-05: 60 mg via ORAL
  Filled 2022-04-05: qty 3

## 2022-04-05 MED ORDER — IOHEXOL 350 MG/ML SOLN
75.0000 mL | Freq: Once | INTRAVENOUS | Status: AC | PRN
  Administered 2022-04-05: 75 mL via INTRAVENOUS

## 2022-04-05 MED ORDER — SODIUM CHLORIDE 0.9 % IV SOLN
1.0000 g | Freq: Once | INTRAVENOUS | Status: AC
  Administered 2022-04-05: 1 g via INTRAVENOUS
  Filled 2022-04-05: qty 10

## 2022-04-05 MED ORDER — IPRATROPIUM-ALBUTEROL 0.5-2.5 (3) MG/3ML IN SOLN
9.0000 mL | Freq: Once | RESPIRATORY_TRACT | Status: AC
  Administered 2022-04-05: 9 mL via RESPIRATORY_TRACT
  Filled 2022-04-05: qty 9

## 2022-04-05 NOTE — ED Provider Triage Note (Signed)
Emergency Medicine Provider Triage Evaluation Note  Jennifer Hodges , a 77 y.o. female  was evaluated in triage.  Pt complains of shob, cough, peripheral edema. Cancer patient. HX anemia and currently not doing chemo until blood counts improve. Chest tightness. No abd pain/GI symptoms.  Review of Systems  Positive: Shob, cough, peripheral edema Negative: CP, fever, GI symptoms  Physical Exam  BP 127/67 (BP Location: Left Arm)   Pulse (!) 112   Temp 98.4 F (36.9 C) (Oral)   Resp 18   Ht 5\' 1"  (1.549 m)   Wt 77.1 kg   SpO2 93%   BMI 32.12 kg/m  Gen:   Awake, no distress   Resp:  Slightly increased effort, productive cough MSK:   Moves extremities without difficulty  Other:    Medical Decision Making  Medically screening exam initiated at 4:24 PM.  Appropriate orders placed.  Jennifer Hodges was informed that the remainder of the evaluation will be completed by another provider, this initial triage assessment does not replace that evaluation, and the importance of remaining in the ED until their evaluation is complete.  Labs, EKG, chest xray, swab   Darletta Moll, PA-C 04/05/22 1624

## 2022-04-05 NOTE — ED Triage Notes (Signed)
Pt woke up this morning wheezing and has tried her albuterol and Respimat inhaler with no relief. She has a cough chronic and pt was dx with lung cancer in October 2022 and will start Chemo in March.

## 2022-04-05 NOTE — ED Notes (Signed)
Patient is being discharged from the Urgent Care and sent to the Emergency Department via personal vehicle . Per Dr. Chaney Malling, patient is in need of higher level of care due to wheezing and SOB. Patient is aware and verbalizes understanding of plan of care.  Vitals:   04/05/22 1211  BP: 126/70  Pulse: 98  Resp: 18  Temp: 98.1 F (36.7 C)  SpO2: 95%

## 2022-04-05 NOTE — H&P (Signed)
History and Physical    Patient: Jennifer Hodges:[id number] DOB: 1945-09-22 DOA: 04/05/2022 DOS: the patient was seen and examined on 04/05/2022 PCP: Marina Goodell, MD  Patient coming from: Home  Chief Complaint:  Chief Complaint  Patient presents with   Shortness of Breath    HPI: Jennifer Hodges is a 77 y.o. 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  O2 sat when checked at home was 86%.  Patient has an associated congested cough but denies fever or chills.  Denies chest pain, lower extremity pain or swelling. ED course and data review: Tachycardic to 112 with O2 sat 93% on room air and otherwise normal vitals.  Labs with troponin of 6 and BNP 115.  CBC and CMP unremarkable, procalcitonin less than 0.10 and respiratory viral panel negative for COVID flu and RSV., Personally viewed  EKG viewed and interpreted shows sinus tachycardia at 111 with no ischemic ST-T wave changes.  CTA chest PE protocol shows a moderate right pleural effusion, negative consolidative changes bilaterally which may represent pneumonia, among other chronic findings as outlined below: IMPRESSION: 1. No CT evidence of pulmonary artery embolus. 2. Moderate right pleural effusion, significantly increased since the prior CT. Small left pleural effusion. 3. Post treatment changes in the lungs and perihilar regions bilaterally. There is interval increase consolidative changes of the perihilar region bilaterally which may represent superimposed pneumonia. Recurrent disease is not excluded. Clinical correlation is recommended. 4.  Aortic  Atherosclerosis (ICD10-I70.0).  Patient was treated with cefepime and azithromycin in the ED for pneumonia as well as [facility] hospitalist consulted for admission   Review of Systems: As mentioned in the history of present illness. All other systems reviewed and are negative.  Past Medical History:  Diagnosis Date   Anemia    Asthma    No Inhalers--Dr. Meredeth Ide will order as needed   Bronchiectasis (HCC)    mild   Chronic headaches     followed by [facility] migraines   COPD (chronic obstructive pulmonary disease) (HCC)    DDD (degenerative disc disease), lumbar    Diverticulosis    Family history of adverse reaction to anesthesia    mother and sisters-PONV   Gall stones    history of   GERD (gastroesophageal reflux disease)    EGD 8/09- non bleeding erosive gastritis, documentd esophageal ulcerations.    Hiatal hernia    small   History of kidney stones    History of pneumonia    Hypercholesterolemia    IBS (irritable bowel syndrome)    Malignant neoplasm of upper lobe of left lung (HCC) 12/27/2018   Meniere disease    Murmur    Osteoarthritis    lumbar disc disease, left hip   Personal history of chemotherapy    Personal history of radiation therapy    Pneumonia 12/2020   PONV (postoperative nausea and vomiting)    Sleep apnea    uses cpap   Vertigo    Weakness of right side of body    Past Surgical History:  Procedure Laterality Date   ABDOMINAL HYSTERECTOMY  age 6   ANTERIOR CERVICAL DECOMP/DISCECTOMY FUSION N/A 02/24/2015   Procedure: CERVICAL FOUR-FIVE,  CERVICAL FIVE-SIX, CERVICAL SIX-SEVEN ANTERIOR CERVICAL DECOMPRESSION/DISCECTOMY FUSION ;  Surgeon: Lisbeth Renshaw, MD;  Location: [facility];  Service: Neurosurgery;  Laterality: N/A;  C45 C56 C67 anterior cervical decompression with fusion interbody prosthesis plating and bonegraft   APPENDECTOMY     BACK SURGERY  1988   4th lumbar fusion   BLADDER SURGERY N/A    with vaginal wall repair   BREAST  CYST ASPIRATION Bilateral    neg   BREAST SURGERY Bilateral    cyst removed and reduction   CARDIAC CATHETERIZATION  2014   CARPAL TUNNEL RELEASE Right 02/11/2016   Procedure: CARPAL TUNNEL RELEASE;  Surgeon: Kennedy Bucker, MD;  Location: [facility] OR[facility];  Service: Orthopedics;  Laterality: Right;   CATARACT EXTRACTION W/ INTRAOCULAR LENS IMPLANT Bilateral 2015   CHOLECYSTECTOMY     EUS N/A 05/31/2012   Procedure: UPPER ENDOSCOPIC ULTRASOUND (EUS) LINEAR;  Surgeon: Rachael Fee, MD;  Location: [facility];  Service: Endoscopy;  Laterality: N/A;   EXCISIONAL HEMORRHOIDECTOMY     JOINT REPLACEMENT Bilateral    KNEE ARTHROSCOPY WITH LATERAL MENISECTOMY Right 07/07/2015   Procedure: KNEE ARTHROSCOPY WITH LATERAL MENISECTOMY, PARTIAL SYNOVECTOMY;  Surgeon: Kennedy Bucker, MD;  Location: [facility] OR[facility];  Service: Orthopedics;  Laterality: Right;   LUMBAR LAMINECTOMY     PORTA CATH INSERTION N/A 01/07/2019   Procedure: PORTA CATH INSERTION;  Surgeon: Annice Needy, MD;  Location: [facility];  Service: Cardiovascular;  Laterality: N/A;   REDUCTION MAMMAPLASTY  1990   RIGHT OOPHORECTOMY     TOTAL HIP ARTHROPLASTY Left 05/01/2014   Dr. Sheppard Evens   TOTAL HIP ARTHROPLASTY Right 08/05/2014   Procedure: TOTAL HIP ARTHROPLASTY ANTERIOR APPROACH;  Surgeon: Kennedy Bucker, MD;  Location: [facility];  Service: Orthopedics;  Laterality: Right;   ULNAR NERVE TRANSPOSITION Right 02/11/2016   Procedure: ULNAR NERVE DECOMPRESSION/TRANSPOSITION;  Surgeon: Kennedy Bucker, MD;  Location: [facility];  Service: Orthopedics;  Laterality: Right;   VIDEO BRONCHOSCOPY WITH ENDOBRONCHIAL ULTRASOUND Left 12/19/2018   Procedure: VIDEO BRONCHOSCOPY WITH ENDOBRONCHIAL ULTRASOUND, LEFT, SLEEP APNEA;  Surgeon: Vida Rigger, MD;  Location: [facility];  Service: Thoracic;  Laterality: Left;   VIDEO BRONCHOSCOPY WITH ENDOBRONCHIAL ULTRASOUND Right 05/14/2021   Procedure: VIDEO BRONCHOSCOPY WITH ENDOBRONCHIAL ULTRASOUND;  Surgeon:  Salena Saner, MD;  Location: [facility];  Service: Pulmonary;  Laterality: Right;   Social History:  reports that she quit smoking about 14 years ago. Her smoking use included cigarettes. She has a 40.00 pack-year smoking history. She has never used smokeless tobacco. She reports that she does not drink alcohol and does not use drugs.  Allergies  Allergen Reactions   Aspirin Hives, Shortness Of Breath and Other (See Comments)    Difficulty breathing   Celebrex [Celecoxib] Shortness Of Breath   Morphine And Related Nausea And Vomiting and Swelling   Adhesive [Tape] Other (See Comments)    Took top layer of skin off when removed.   Clarithromycin Nausea And Vomiting   Codeine Nausea And Vomiting   Darvon [Propoxyphene Hcl] Nausea And Vomiting   Demerol [Meperidine] Nausea And Vomiting   Flonase [Fluticasone Propionate] Other (See Comments)    Fungal infection in nose   Simvastatin Other (See Comments)    "caused ulcers in mouth, and fever"   Talwin [Pentazocine] Nausea And Vomiting    Family History  Problem Relation Age of Onset   Heart disease Mother        s/p stent   Hypertension Mother    Hypercholesterolemia  Mother    Diabetes Father    Stomach cancer Other        uncle   Breast cancer Cousin    Breast cancer Paternal Aunt     Prior to Admission medications   Medication Sig Start Date End Date Taking? Authorizing Provider  acetaminophen (TYLENOL) 500 MG tablet Take 500 mg by mouth 2 (two) times daily.    [provider]  albuterol (VENTOLIN HFA) 108 (90 Base) MCG/ACT inhaler Inhale 2 puffs into the lungs every 6 (six) hours as needed for wheezing or shortness of breath.    [provider]  Bioflavonoid Products (ESTER C PO) Take 1 tablet by mouth 3 (three) times daily. 500 mg    [provider]  Calcium-Magnesium-Zinc (CAL-MAG-ZINC PO) Take 1 tablet by mouth daily.    [provider]  carboxymethylcellulose (REFRESH PLUS) 0.5 %  SOLN Place 1 drop into both eyes 3 (three) times daily as needed (dry eyes).    [provider]  Cholecalciferol (EQL VITAMIN D3) 25 MCG (1000 UT) capsule Take 1,000 Units by mouth daily.    [provider]  Coenzyme Q10 (CO Q 10) 100 MG CAPS Take 100 mg by mouth daily.     [provider]  Cyanocobalamin (VITAMIN B-12) 2500 MCG SUBL Place 2,500 mcg under the tongue daily.    [provider]  cyclobenzaprine (FLEXERIL) 10 MG tablet Take 10 mg by mouth at bedtime.    [provider]  DHEA 25 MG CAPS Take 25 mg by mouth daily.    [provider]  ezetimibe (ZETIA) 10 MG tablet Take 5 mg by mouth in the morning and at bedtime. 10/26/18   [provider]  folic acid (FOLVITE) 400 MCG tablet Take 400 mcg by mouth daily.    [provider]  Histamine Dihydrochloride (AUSTRALIAN DREAM ARTHRITIS) 0.025 % CREA Apply 1 application. topically 4 (four) times daily as needed (pain).    [provider]  HYDROcodone-acetaminophen (NORCO/VICODIN) 5-325 MG tablet Take 1 tablet by mouth at bedtime. 02/09/22   Pennie Banter, DO  levOCARNitine (L-CARNITINE) 250 MG TABS Take 1 tablet by mouth daily. With Chromium 250 mg    [provider]  ondansetron (ZOFRAN) 8 MG tablet Take by mouth every 8 (eight) hours as needed for nausea or vomiting.    [provider]  pantoprazole (PROTONIX) 40 MG tablet Take 40 mg by mouth 2 (two) times daily.    [provider]  polyethylene glycol powder (GLYCOLAX/MIRALAX) 17 GM/SCOOP powder Take 17 g by mouth daily as needed for moderate constipation.    [provider]  pyridOXINE (VITAMIN B-6) 100 MG tablet Take 100 mg by mouth daily.    [provider]  Selenium 200 MCG CAPS Take 200 mcg by mouth daily.    [provider]  Sennosides (SENNA) 8.6 MG CAPS Take 1 capsule by mouth daily as needed (constipation).    [provider]  sucralfate  (CARAFATE) 1 GM/10ML suspension Take 1 g by mouth daily. 02/13/19   [provider]  Tiotropium Bromide-Olodaterol (STIOLTO RESPIMAT) 2.5-2.5 MCG/ACT AERS Inhale 2 puffs into the lungs daily. 06/07/21   Salena Saner, MD  triamcinolone (NASACORT) 55 MCG/ACT AERO nasal inhaler Place 2 sprays into the nose daily.    [provider]  UBRELVY 100 MG TABS Take 100 mg by mouth daily as needed (migraines). 01/30/20   [provider]  Vitamin A 2400 MCG (8000 UT) CAPS  Take 8,000 Units by mouth daily.    [provider]  Vitamin E 200 units TABS Take 200 Units by mouth daily.    [provider]  Wheat Dextrin (EQ FIBER POWDER PO) Take by mouth.    [provider]    Physical Exam: Vitals:   04/05/22 1616 04/05/22 1849  BP: 127/67 126/80  Pulse: (!) 112 92  Resp: 18 16  Temp: 98.4 F (36.9 C) 97.8 F (36.6 C)  TempSrc: Oral Oral  SpO2: 93% 98%  Weight: 77.1 kg   Height: 5\' 1"  (1.549 m)    Physical Exam  Labs on Admission: I have personally reviewed following labs and imaging studies  CBC: Recent Labs  Lab 04/05/22 1621  WBC 4.4  NEUTROABS 3.2  HGB 13.0  HCT 40.3  MCV 92.2  PLT 294   Basic Metabolic Panel: Recent Labs  Lab 04/05/22 1621  NA 139  K 3.8  CL 102  CO2 26  GLUCOSE 137*  BUN 12  CREATININE 0.53  CALCIUM 9.4   GFR: Estimated Creatinine Clearance: 55.3 mL/min (by C-G formula based on SCr of 0.53 mg/dL). Liver Function Tests: Recent Labs  Lab 04/05/22 1621  AST 23  ALT 11  ALKPHOS 91  BILITOT 0.7  PROT 6.6  ALBUMIN 3.9   No results for input(s): "LIPASE", "AMYLASE" in the last 168 hours. No results for input(s): "AMMONIA" in the last 168 hours. Coagulation Profile: Recent Labs  Lab 04/05/22 1621  INR 1.1   Cardiac Enzymes: No results for input(s): "CKTOTAL", "CKMB", "CKMBINDEX", "TROPONINI" in the last 168 hours. BNP (last 3 results) No results for input(s): "PROBNP" in the last 8760  hours. HbA1C: No results for input(s): "HGBA1C" in the last 72 hours. CBG: No results for input(s): "GLUCAP" in the last 168 hours. Lipid Profile: No results for input(s): "CHOL", "HDL", "LDLCALC", "TRIG", "CHOLHDL", "LDLDIRECT" in the last 72 hours. Thyroid Function Tests: No results for input(s): "TSH", "T4TOTAL", "FREET4", "T3FREE", "THYROIDAB" in the last 72 hours. Anemia Panel: No results for input(s): "VITAMINB12", "FOLATE", "FERRITIN", "TIBC", "IRON", "RETICCTPCT" in the last 72 hours. Urine analysis:    Component Value Date/Time   COLORURINE YELLOW 07/03/2021 0921   APPEARANCEUR Clear 09/20/2021 1449   LABSPEC 1.010 07/03/2021 0921   PHURINE 6.0 07/03/2021 0921   GLUCOSEU Negative 09/20/2021 1449   GLUCOSEU NEGATIVE 06/19/2014 1002   HGBUR MODERATE (A) 07/03/2021 0921   BILIRUBINUR Negative 09/20/2021 1449   KETONESUR NEGATIVE 07/03/2021 0921   PROTEINUR Negative 09/20/2021 1449   PROTEINUR TRACE (A) 07/03/2021 0921   UROBILINOGEN 1.0 03/04/2015 1339   UROBILINOGEN 0.2 06/19/2014 1002   NITRITE Negative 09/20/2021 1449   NITRITE NEGATIVE 07/03/2021 0921   LEUKOCYTESUR Trace (A) 09/20/2021 1449   LEUKOCYTESUR LARGE (A) 07/03/2021 0921    Radiological Exams on Admission: CT Angio Chest PE W/Cm &/Or Wo Cm  Result Date: 04/05/2022 CLINICAL DATA:  Concern for pulmonary embolism. Metastatic lung cancer. EXAM: CT ANGIOGRAPHY CHEST WITH CONTRAST TECHNIQUE: Multidetector CT imaging of the chest was performed using the standard protocol during bolus administration of intravenous contrast. Multiplanar CT image reconstructions and MIPs were obtained to evaluate the vascular anatomy. RADIATION DOSE REDUCTION: This exam was performed according to the departmental dose-optimization program which includes automated exposure control, adjustment of the mA and/or kV according to patient size and/or use of iterative reconstruction technique. CONTRAST:  56mL OMNIPAQUE IOHEXOL 350 MG/ML SOLN  COMPARISON:  CT dated 02/15/2022. FINDINGS: Cardiovascular: There is no cardiomegaly or pericardial effusion.  There is coronary vascular calcification. Small pericardial effusion measuring 7 mm in thickness, increased since the prior CT. Mild atherosclerotic calcification of the thoracic aorta. No aneurysmal dilatation or dissection. The origins of the great vessels of the aortic arch are patent. No pulmonary artery embolus identified. Mediastinum/Nodes: Evaluation of the hilar and mediastinal lymph nodes is limited due to consolidative changes of the lungs. The esophagus is grossly unremarkable. No mediastinal fluid collection. Lungs/Pleura: Background of emphysema. Moderate right pleural effusion, significantly increased since the prior CT. Small left pleural effusion. Post treatment changes in the lungs and perihilar regions bilaterally. There is however interval increase consolidative changes of the perihilar region bilaterally which may represent superimposed pneumonia. Recurrent disease is not excluded clinical correlation is recommended. Increased consolidation of the right upper lobe and left lower lobe. No pneumothorax. There is mucous impaction of the segmental left lower lobe bronchus. Additionally there is narrowing of the right lower lobe bronchus. Upper Abdomen: Cholecystectomy. Musculoskeletal: Degenerative changes of the spine. Partially visualized lower cervical ACDF. No acute osseous pathology. Review of the MIP images confirms the above findings. IMPRESSION: 1. No CT evidence of pulmonary artery embolus. 2. Moderate right pleural effusion, significantly increased since the prior CT. Small left pleural effusion. 3. Post treatment changes in the lungs and perihilar regions bilaterally. There is interval increase consolidative changes of the perihilar region bilaterally which may represent superimposed pneumonia. Recurrent disease is not excluded. Clinical correlation is recommended. 4.  Aortic  Atherosclerosis (ICD10-I70.0). Electronically Signed   By: Elgie Collard M.D.   On: 04/05/2022 22:56   DG Chest 2 View  Result Date: 04/05/2022 CLINICAL DATA:  Shortness of breath and cough EXAM: CHEST - 2 VIEW COMPARISON:  X-ray 02/09/2022 and older.  CT scan December 2023 FINDINGS: Fixation hardware along the cervical spine. Right IJ chest port with tip at the central SVC. Once again there is volume loss right hemithorax with pleural thickening or small effusions. Nodular fullness of the right lung hilum. Interstitial changes. Left lung is grossly clear. Tenting of the left hemidiaphragm. No pneumothorax. Stable cardiopericardial silhouette. Degenerative changes of the spine on lateral view. IMPRESSION: Slight increase in right-sided pleural effusion Electronically Signed   By: Karen Kays M.D.   On: 04/05/2022 17:06     Data Reviewed: Relevant notes from primary care and specialist visits, past discharge summaries as available in EHR, including Care Everywhere. Prior diagnostic testing as pertinent to current admission diagnoses Updated medications and problem lists for reconciliation ED course, including vitals, labs, imaging, treatment and response to treatment Triage notes, nursing and pharmacy notes and ED provider's notes Notable results as noted in HPI   Assessment and Plan: * Pneumonia COPD with acute bronchitis Right pleural effusion Patient with shortness of breath and wheezing, WBC normal and procalcitonin less than 0.10 CTA chest showing perihilar consolidation and possible pneumonia.  Negative for PE Received Rocephin and azithromycin in the ED Given procalcitonin less than 0.1 will not continue antibiotics Suspect symptoms mostly related to COPD and pleural effusion Scheduled and as needed nebulized bronchodilators Steroids Arrange for cystoscopy with IR  Malignant neoplasm of upper lobe of left lung (HCC) S/p radiation and chemo  Benign essential hypertension Not  currently on antihypertensives Patient is normotensive so we will continue to monitor     DVT prophylaxis: SCD  Consults: none  Advance Care Planning:   Code Status: Prior   Family Communication: none  Disposition Plan: Back to previous home environment  Severity of Illness: The  appropriate patient status for this patient is INPATIENT. Inpatient status is judged to be reasonable and necessary in order to provide the required intensity of service to ensure the patient's safety. The patient's presenting symptoms, physical exam findings, and initial radiographic and laboratory data in the context of their chronic comorbidities is felt to place them at high risk for further clinical deterioration. Furthermore, it is not anticipated that the patient will be medically stable for discharge from the hospital within 2 midnights of admission.   * I certify that at the point of admission it is my clinical judgment that the patient will require inpatient hospital care spanning beyond 2 midnights from the point of admission due to high intensity of service, high risk for further deterioration and high frequency of surveillance required.*  Author: Andris Baumann, MD 04/05/2022 11:51 PM  For on call review [email or web address].

## 2022-04-05 NOTE — ED Provider Notes (Signed)
[facility] Provider Note    Event Date/Time   First MD Initiated Contact with Patient 04/05/22 2020     (approximate)   History   Chief Complaint Shortness of Breath   HPI  Jennifer Hodges is a 77 y.o. female with past medical history of lung cancer, vocal cord dysfunction, COPD, and GERD who presents to the ED complaining of shortness of breath.  Patient reports that she has been dealing with increasing difficulty breathing over the past couple of weeks that seem to get acutely worse last night.  She states her breathing seems to be worse when she is laying flat or with any exertion.  She deals with a chronic nonproductive cough that has been unchanged, additionally reports chronic discomfort in her chest when coughing that has been unchanged.  She denies any fevers, states her legs stay swollen and have been no worse than usual.  She does feel like fluid has reaccumulated in her chest, required admission to the hospital and thoracentesis for this back in November.     Physical Exam   Triage Vital Signs: ED Triage Vitals [04/05/22 1616]  Enc Vitals Group     BP 127/67     Pulse Rate (!) 112     Resp 18     Temp 98.4 F (36.9 C)     Temp Source Oral     SpO2 93 %     Weight 170 lb (77.1 kg)     Height 5\' 1"  (1.549 m)     Head Circumference      Peak Flow      Pain Score      Pain Loc      Pain Edu?      Excl. in GC?     Most recent vital signs: Vitals:   04/05/22 1616 04/05/22 1849  BP: 127/67 126/80  Pulse: (!) 112 92  Resp: 18 16  Temp: 98.4 F (36.9 C) 97.8 F (36.6 C)  SpO2: 93% 98%    Constitutional: Alert and oriented. Eyes: Conjunctivae are normal. Head: Atraumatic. Nose: No congestion/rhinnorhea. Mouth/Throat: Mucous membranes are moist.  Cardiovascular: Normal rate, regular rhythm. Grossly normal heart sounds.  2+ radial pulses bilaterally. Respiratory: Normal respiratory effort.  No retractions. Lungs with Rales to right  base, wheezing noted throughout. Gastrointestinal: Soft and nontender. No distention. Musculoskeletal: No lower extremity tenderness, 1+ pitting edema to knees bilaterally. Neurologic:  Normal speech and language. No gross focal neurologic deficits are appreciated.    ED Results / Procedures / Treatments   Labs (all labs ordered are listed, but only abnormal results are displayed) Labs Reviewed  COMPREHENSIVE METABOLIC PANEL - Abnormal; Notable for the following components:      Result Value   Glucose, Bld 137 (*)    All other components within normal limits  BRAIN NATRIURETIC PEPTIDE - Abnormal; Notable for the following components:   B Natriuretic Peptide 115.4 (*)    All other components within normal limits  RESP PANEL BY RT-PCR (RSV, FLU A&B, COVID)  [id number]  CBC WITH DIFFERENTIAL/PLATELET  PROTIME-INR  PROCALCITONIN  URINALYSIS, ROUTINE W REFLEX MICROSCOPIC  TROPONIN I (HIGH SENSITIVITY)  TROPONIN I (HIGH SENSITIVITY)     EKG  ED ECG REPORT I, 04/07/22, the attending physician, personally viewed and interpreted this ECG.   Date: 04/05/2022  EKG Time: 16:18  Rate: 111  Rhythm: sinus tachycardia  Axis: Normal  Intervals:none  ST&T Change: None  RADIOLOGY Chest x-ray reviewed and  interpreted by me with right pleural effusion, no infiltrate or edema noted.  PROCEDURES:  Critical Care performed: No  Procedures   MEDICATIONS ORDERED IN ED: Medications  ceFEPIme (MAXIPIME) 1 g in sodium chloride 0.9 % 100 mL IVPB (1 g Intravenous New Bag/Given 04/05/22 2326)  azithromycin (ZITHROMAX) 500 mg in sodium chloride 0.9 % 250 mL IVPB (500 mg Intravenous New Bag/Given 04/05/22 2329)  oxyCODONE-acetaminophen (PERCOCET/ROXICET) 5-325 MG per tablet 1 tablet (has no administration in time range)  predniSONE (DELTASONE) tablet 60 mg (60 mg Oral Given 04/05/22 2320)  ipratropium-albuterol (DUONEB) 0.5-2.5 (3) MG/3ML nebulizer solution 9 mL (9 mLs Nebulization Given  04/05/22 2316)  iohexol (OMNIPAQUE) 350 MG/ML injection 75 mL (75 mLs Intravenous Contrast Given 04/05/22 2238)     IMPRESSION / MDM / ASSESSMENT AND PLAN / ED COURSE  I reviewed the triage vital signs and the nursing notes.                              77 y.o. female with past medical history of lung cancer, vocal cord dysfunction, COPD, and GERD who presents to the ED with increasing difficulty breathing over the past 2 weeks, acutely worse last night.  Patient's presentation is most consistent with acute presentation with potential threat to life or bodily function.  Differential diagnosis includes, but is not limited to, ACS, PE, pneumonia, CHF, COPD exacerbation, pleural effusion.  Patient nontoxic-appearing and in no acute distress, vital signs are unremarkable, patient maintaining oxygen saturations at 98% on room air while at rest.  She does have wheezing throughout and rales noted at the right base.  Chest x-ray concerning for recurrent right-sided pleural effusion, for which she has required thoracentesis in the past.  We will treat apparent COPD exacerbation with DuoNebs and dose of steroids.  CTA chest is negative for PE but does show moderate right-sided effusion and large compared to prior imaging.  There is possible development of pneumonia and we will treat with cefepime and azithromycin.  Labs without significant anemia, leukocytosis, tract abnormality, or AKI.  2 sets of troponin are negative and I doubt ACS.  COVID and flu testing is negative, case discussed with hospitalist for admission.      FINAL CLINICAL IMPRESSION(S) / ED DIAGNOSES   Final diagnoses:  Shortness of breath  COPD exacerbation (HCC)  Pleural effusion     Rx / DC Orders   ED Discharge Orders     None        Note:  This document was prepared using Dragon voice recognition software and may include unintentional dictation errors.   Chesley Noon, MD 04/05/22 [phone]

## 2022-04-05 NOTE — ED Triage Notes (Signed)
Patient to ED for SOB. States O2 was 86% on RA at home last PM and took a breathing treatment at home that somewhat helped. 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 Currently has lung cancer- not currently getting chemo due to being anemic. Congested cough noted.

## 2022-04-05 NOTE — ED Notes (Signed)
Patient is being discharged from the Urgent Care and sent to the Emergency Department via POV . Per Domenick Gong, MD patient is in need of higher level of care due needing further evaluation & chest pain. Patient is aware and verbalizes understanding of plan of care.  Vitals:   04/05/22 1211  BP: 126/70  Pulse: 98  Resp: 18  Temp: 98.1 F (36.7 C)  SpO2: 95%

## 2022-04-05 NOTE — ED Provider Notes (Signed)
HPI  SUBJECTIVE:  Jennifer Hodges is a 77 y.o. female who presents with severe shortness of breath, wheezing and hypoxia to 86% on room air this morning.  She has had a worsening and more frequent nonproductive cough, wheezing, worsening dyspnea on exertion, PND, fatigue, abdominal pain over the past week.  Thinks that she may have gained some weight over the past week.  No unintentional weight loss.  No change in her baseline lower extremity edema, although the right is slightly larger than the left after having a biopsy 1 week ago for melanoma.  She already sleeps sitting up in a chair with her CPAP machine.  She also reports 2 days of intermittent, minutes long, nonradiating episodes of substernal chest pain described as soreness that gets worse with exertion.  She reports accompanying diaphoresis, no nausea.  She tried her Respimat and albuterol inhaler without improvement in her symptoms.  States that she has been doing 2 puffs from her albuterol inhaler every 4 hours without improvement.  She has also been drinking lemon water to try and alleviate the cough.  Her symptoms are worse with exposure to cold weather, exertion and with talking.  She has an extensive past medical history including asthma/COPD, lung adenocarcinoma, right pulmonary effusion that was drained in November 23, pneumonia, pulmonary fibrosis, hypertension, anemia, melanoma, lymphedema bilateral lower extremities, coronary disease, hypercholesterolemia and OSA on CPAP.  No history of CHF, DVT, PE, MI, diabetes.  She is not currently on steroids or chemotherapy.  PCP: [facility].    Past Medical History:  Diagnosis Date   Anemia    Asthma    No Inhalers--Dr. Meredeth Ide will order as needed   Bronchiectasis (HCC)    mild   Chronic headaches     followed by [facility] migra[facility]   COPD (chronic obstructive pulmonary disease) (HCC)    DDD (degenerative disc disease), lumbar    Diverticulosis    Family history of adverse  reaction to anesthesia    mother and sisters-PONV   Gall stones    history of   GERD (gastroesophageal reflux disease)    EGD 8/09- non bleeding erosive gastritis, documentd esophageal ulcerations.    Hiatal hernia    small   History of kidney stones    History of pneumonia    Hypercholesterolemia    IBS (irritable bowel syndrome)    Malignant neoplasm of upper lobe of left lung (HCC) 12/27/2018   Meniere disease    Murmur    Osteoarthritis    lumbar disc disease, left hip   Personal history of chemotherapy    Personal history of radiation therapy    Pneumonia 12/2020   PONV (postoperative nausea and vomiting)    Sleep apnea    uses cpap   Vertigo    Weakness of right side of body     Past Surgical History:  Procedure Laterality Date   ABDOMINAL HYSTERECTOMY  age 85   ANTERIOR CERVICAL DECOMP/DISCECTOMY FUSION N/A 02/24/2015   Procedure: CERVICAL FOUR-FIVE, CERVICAL FIVE-SIX, CERVICAL SIX-SEVEN ANTERIOR CERVICAL DECOMPRESSION/DISCECTOMY FUSION ;  Surgeon: Lisbeth Renshaw, MD;  Location: [facility];  Service: Neurosurgery;  Laterality: N/A;  C45 C56 C67 anterior cervical decompression with fusion interbody prosthesis plating and bonegraft   APPENDECTOMY     BACK SURGERY  1988   4th lumbar fusion   BLADDER SURGERY N/A    with vaginal wall repair   BREAST CYST ASPIRATION Bilateral    neg   BREAST SURGERY Bilateral  cyst removed and reduction   CARDIAC CATHETERIZATION  2014   CARPAL TUNNEL RELEASE Right 02/11/2016   Procedure: CARPAL TUNNEL RELEASE;  Surgeon: Kennedy Bucker, MD;  Location: [facility];  Service: Orthopedics;  Laterality: Right;   CATARACT EXTRACTION W/ INTRAOCULAR LENS IMPLANT Bilateral 2015   CHOLECYSTECTOMY     EUS N/A 05/31/2012   Procedure: UPPER ENDOSCOPIC ULTRASOUND (EUS) LINEAR;  Surgeon: Rachael Fee, MD;  Location: [facility];  Service: Endoscopy;  Laterality: N/A;   EXCISIONAL HEMORRHOIDECTOMY     JOINT REPLACEMENT Bilateral    KNEE  ARTHROSCOPY WITH LATERAL MENISECTOMY Right 07/07/2015   Procedure: KNEE ARTHROSCOPY WITH LATERAL MENISECTOMY, PARTIAL SYNOVECTOMY;  Surgeon: Kennedy Bucker, MD;  Location: [facility];  Service: Orthopedics;  Laterality: Right;   LUMBAR LAMINECTOMY     PORTA CATH INSERTION N/A 01/07/2019   Procedure: PORTA CATH INSERTION;  Surgeon: Annice Needy, MD;  Location: [facility];  Service: Cardiovascular;  Laterality: N/A;   REDUCTION MAMMAPLASTY  1990   RIGHT OOPHORECTOMY     TOTAL HIP ARTHROPLASTY Left 05/01/2014   Dr. Sheppard Evens   TOTAL HIP ARTHROPLASTY Right 08/05/2014   Procedure: TOTAL HIP ARTHROPLASTY ANTERIOR APPROACH;  Surgeon: Kennedy Bucker, MD;  Location: [facility];  Service: Orthopedics;  Laterality: Right;   ULNAR NERVE TRANSPOSITION Right 02/11/2016   Procedure: ULNAR NERVE DECOMPRESSION/TRANSPOSITION;  Surgeon: Kennedy Bucker, MD;  Location: [facility];  Service: Orthopedics;  Laterality: Right;   VIDEO BRONCHOSCOPY WITH ENDOBRONCHIAL ULTRASOUND Left 12/19/2018   Procedure: VIDEO BRONCHOSCOPY WITH ENDOBRONCHIAL ULTRASOUND, LEFT, SLEEP APNEA;  Surgeon: Vida Rigger, MD;  Location: [facility];  Service: Thoracic;  Laterality: Left;   VIDEO BRONCHOSCOPY WITH ENDOBRONCHIAL ULTRASOUND Right 05/14/2021   Procedure: VIDEO BRONCHOSCOPY WITH ENDOBRONCHIAL ULTRASOUND;  Surgeon: Salena Saner, MD;  Location: [facility];  Service: Pulmonary;  Laterality: Right;    Family History  Problem Relation Age of Onset   Heart disease Mother        s/p stent   Hypertension Mother    Hypercholesterolemia Mother    Diabetes Father    Stomach cancer Other        uncle   Breast cancer Cousin    Breast cancer Paternal Aunt     Social History   Tobacco Use   Smoking status: Former    Packs/day: 1.00    Years: 40.00    Total pack years: 40.00    Types: Cigarettes    Quit date: 06/13/2007    Years since quitting: 14.8   Smokeless tobacco: Never  Vaping Use   Vaping Use: Never used  Substance  Use Topics   Alcohol use: No    Alcohol/week: 0.0 standard drinks of alcohol   Drug use: No    No current facility-administered medications for this encounter.  Current Outpatient Medications:    acetaminophen (TYLENOL) 500 MG tablet, Take 500 mg by mouth 2 (two) times daily., Disp: , Rfl:    albuterol (VENTOLIN HFA) 108 (90 Base) MCG/ACT inhaler, Inhale 2 puffs into the lungs every 6 (six) hours as needed for wheezing or shortness of breath., Disp: , Rfl:    Bioflavonoid Products (ESTER C PO), Take 1 tablet by mouth 3 (three) times daily. 500 mg, Disp: , Rfl:    Calcium-Magnesium-Zinc (CAL-MAG-ZINC PO), Take 1 tablet by mouth daily., Disp: , Rfl:    carboxymethylcellulose (REFRESH PLUS) 0.5 % SOLN, Place 1 drop into both eyes 3 (three) times daily as needed (dry eyes)., Disp: , Rfl:  Cholecalciferol (EQL VITAMIN D3) 25 MCG (1000 UT) capsule, Take 1,000 Units by mouth daily., Disp: , Rfl:    Coenzyme Q10 (CO Q 10) 100 MG CAPS, Take 100 mg by mouth daily. , Disp: , Rfl:    Cyanocobalamin (VITAMIN B-12) 2500 MCG SUBL, Place 2,500 mcg under the tongue daily., Disp: , Rfl:    cyclobenzaprine (FLEXERIL) 10 MG tablet, Take 10 mg by mouth at bedtime., Disp: , Rfl:    DHEA 25 MG CAPS, Take 25 mg by mouth daily., Disp: , Rfl:    ezetimibe (ZETIA) 10 MG tablet, Take 5 mg by mouth in the morning and at bedtime., Disp: , Rfl:    folic acid (FOLVITE) 161 MCG tablet, Take 400 mcg by mouth daily., Disp: , Rfl:    Histamine Dihydrochloride (AUSTRALIAN DREAM ARTHRITIS) 0.025 % CREA, Apply 1 application. topically 4 (four) times daily as needed (pain)., Disp: , Rfl:    HYDROcodone-acetaminophen (NORCO/VICODIN) 5-325 MG tablet, Take 1 tablet by mouth at bedtime., Disp: 30 tablet, Rfl: 0   levOCARNitine (L-CARNITINE) 250 MG TABS, Take 1 tablet by mouth daily. With Chromium 250 mg, Disp: , Rfl:    ondansetron (ZOFRAN) 8 MG tablet, Take by mouth every 8 (eight) hours as needed for nausea or vomiting., Disp: ,  Rfl:    pantoprazole (PROTONIX) 40 MG tablet, Take 40 mg by mouth 2 (two) times daily., Disp: , Rfl:    polyethylene glycol powder (GLYCOLAX/MIRALAX) 17 GM/SCOOP powder, Take 17 g by mouth daily as needed for moderate constipation., Disp: , Rfl:    pyridOXINE (VITAMIN B-6) 100 MG tablet, Take 100 mg by mouth daily., Disp: , Rfl:    Selenium 200 MCG CAPS, Take 200 mcg by mouth daily., Disp: , Rfl:    Sennosides (SENNA) 8.6 MG CAPS, Take 1 capsule by mouth daily as needed (constipation)., Disp: , Rfl:    sucralfate (CARAFATE) 1 GM/10ML suspension, Take 1 g by mouth daily., Disp: , Rfl:    Tiotropium Bromide-Olodaterol (STIOLTO RESPIMAT) 2.5-2.5 MCG/ACT AERS, Inhale 2 puffs into the lungs daily., Disp: 4 g, Rfl: 10   triamcinolone (NASACORT) 55 MCG/ACT AERO nasal inhaler, Place 2 sprays into the nose daily., Disp: , Rfl:    UBRELVY 100 MG TABS, Take 100 mg by mouth daily as needed (migraines)., Disp: , Rfl:    Vitamin A 2400 MCG (8000 UT) CAPS, Take 8,000 Units by mouth daily., Disp: , Rfl:    Vitamin E 200 units TABS, Take 200 Units by mouth daily., Disp: , Rfl:    Wheat Dextrin (EQ FIBER POWDER PO), Take by mouth., Disp: , Rfl:   Allergies  Allergen Reactions   Aspirin Hives, Shortness Of Breath and Other (See Comments)    Difficulty breathing   Celebrex [Celecoxib] Shortness Of Breath   Morphine And Related Nausea And Vomiting and Swelling   Adhesive [Tape] Other (See Comments)    Took top layer of skin off when removed.   Clarithromycin Nausea And Vomiting   Codeine Nausea And Vomiting   Darvon [Propoxyphene Hcl] Nausea And Vomiting   Demerol [Meperidine] Nausea And Vomiting   Flonase [Fluticasone Propionate] Other (See Comments)    Fungal infection in nose   Simvastatin Other (See Comments)    "caused ulcers in mouth, and fever"   Talwin [Pentazocine] Nausea And Vomiting     ROS  As noted in HPI.   Physical Exam  BP 126/70 (BP Location: Left Arm)   Pulse 98   Temp 98.1 F  (  36.7 C) (Oral)   Resp 18   SpO2 95%   Constitutional: Well developed, well nourished, slightly dyspneic when talking.  Speaking in full sentences. Eyes:  EOMI, conjunctiva normal bilaterally HENT: Normocephalic, atraumatic,mucus membranes moist Respiratory: Normal inspiratory effort.  Diffuse wheezing, rhonchi, decreased breath sounds right lower lobe.  Posterior left chest wall tenderness where patient had radiation. Cardiovascular: Normal rate, regular rhythm, no murmurs rubs or gallops GI: nondistended skin: No rash, skin intact Musculoskeletal: 1+ bilateral lower extremity edema Neurologic: Alert & oriented x 3, no focal neuro deficits Psychiatric: Speech and behavior appropriate   ED Course   Medications - No data to display  Orders Placed This Encounter  Procedures   ED EKG    Standing Status:   Standing    Number of Occurrences:   1    Order Specific Question:   Reason for Exam    Answer:   Shortness of breath   EKG 12-Lead    Standing Status:   Standing    Number of Occurrences:   1   EKG 12-Lead    Standing Status:   Standing    Number of Occurrences:   1    No results found for this or any previous visit (from the past 24 hour(s)). No results found.  ED Clinical Impression  1. Shortness of breath   2. Wheezing   3. Chest pain, unspecified type      ED Assessment/Plan     In the differential for shortness of breath and hypoxia is worsening cancer, pulmonary effusion, pulmonary edema, acute CHF, MI, PE, asthma/COPD exacerbation, acute anemia.  She is also describing 2 days of substernal, intermittent, minutes long chest pain that gets worse with exertion, which is worrisome for ACS.  Discussed with patient that the differential of her symptoms is vast, and that I feel that she would benefit from more comprehensive workup than what can be done here at the urgent care to make sure that she is safe to go home.  Will get an EKG here.  EKG: Normal sinus  rhythm, rate 98.  Normal axis, normal intervals.  No hypertrophy.  No ST-T wave changes.  Patient asymptomatic while EKG was obtained.  Patient does not want to go by EMS.  Her vitals are stable, her EKG is normal.  Feel that patient is stable to go by private vehicle, although I emphasized that she needs to go immediately to the emergency department..  Discussed rationale for transfer to the emergency department with the patient and spouse.  They agree to go.   No orders of the defined types were placed in this encounter.     *This clinic note was created using Dragon dictation software. Therefore, there may be occasional mistakes despite careful proofreading.  ?    Domenick Gong, MD 04/05/22 1400

## 2022-04-05 NOTE — Discharge Instructions (Signed)
Your EKG was normal.  However, as we discussed, there are many serious causes of your symptoms, and I feel that you need a more thorough evaluation than what can be done here at the urgent care to make sure that you are safe to go home.  Please go immediately to the emergency department of your choice.  Let them know if your symptoms change, get worse, or for other concerns.

## 2022-04-06 ENCOUNTER — Inpatient Hospital Stay: Payer: Medicare Other

## 2022-04-06 DIAGNOSIS — Z9889 Other specified postprocedural states: Secondary | ICD-10-CM

## 2022-04-06 DIAGNOSIS — J90 Pleural effusion, not elsewhere classified: Secondary | ICD-10-CM

## 2022-04-06 DIAGNOSIS — J441 Chronic obstructive pulmonary disease with (acute) exacerbation: Secondary | ICD-10-CM

## 2022-04-06 DIAGNOSIS — R0602 Shortness of breath: Secondary | ICD-10-CM

## 2022-04-06 LAB — URINALYSIS, ROUTINE W REFLEX MICROSCOPIC
Bacteria, UA: NONE SEEN
Bilirubin Urine: NEGATIVE
Glucose, UA: NEGATIVE mg/dL
Hgb urine dipstick: NEGATIVE
Ketones, ur: 5 mg/dL — AB
Nitrite: NEGATIVE
Protein, ur: NEGATIVE mg/dL
Specific Gravity, Urine: 1.03 (ref 1.005–1.030)
pH: 5 (ref 5.0–8.0)

## 2022-04-06 LAB — CBC
HCT: 38.3 % (ref 36.0–46.0)
Hemoglobin: 12.4 g/dL (ref 12.0–15.0)
MCH: 29.5 pg (ref 26.0–34.0)
MCHC: 32.4 g/dL (ref 30.0–36.0)
MCV: 91.2 fL (ref 80.0–100.0)
Platelets: 263 10*3/uL (ref 150–400)
RBC: 4.2 MIL/uL (ref 3.87–5.11)
RDW: 13 % (ref 11.5–15.5)
WBC: 4.2 10*3/uL (ref 4.0–10.5)
nRBC: 0 % (ref 0.0–0.2)

## 2022-04-06 LAB — PATHOLOGIST SMEAR REVIEW

## 2022-04-06 LAB — BODY FLUID CELL COUNT WITH DIFFERENTIAL
Eos, Fluid: 1 %
Lymphs, Fluid: 88 %
Monocyte-Macrophage-Serous Fluid: 8 %
Neutrophil Count, Fluid: 3 %
Total Nucleated Cell Count, Fluid: 4300 cu mm

## 2022-04-06 LAB — CREATININE, SERUM
Creatinine, Ser: 0.57 mg/dL (ref 0.44–1.00)
GFR, Estimated: >60 mL/min (ref >60)

## 2022-04-06 MED ORDER — ACETAMINOPHEN 325 MG PO TABS
650.0000 mg | ORAL_TABLET | Freq: Four times a day (QID) | ORAL | Status: DC | PRN
  Administered 2022-04-07: 650 mg via ORAL
  Filled 2022-04-06: qty 2

## 2022-04-06 MED ORDER — ACETAMINOPHEN 650 MG RE SUPP: 650.0000 mg | Freq: Four times a day (QID) | RECTAL | Status: DC | PRN

## 2022-04-06 MED ORDER — SENNA 8.6 MG PO TABS: 1.0000 | ORAL_TABLET | Freq: Every day | ORAL | Status: DC | PRN

## 2022-04-06 MED ORDER — ALBUTEROL SULFATE (2.5 MG/3ML) 0.083% IN NEBU: 2.5000 mg | INHALATION_SOLUTION | RESPIRATORY_TRACT | Status: DC | PRN

## 2022-04-06 MED ORDER — LIDOCAINE HCL (PF) 1 % IJ SOLN
10.0000 mL | Freq: Once | INTRAMUSCULAR | Status: AC
  Administered 2022-04-06: 10 mL via INTRADERMAL

## 2022-04-06 MED ORDER — ONDANSETRON HCL 4 MG/2ML IJ SOLN: 4.0000 mg | Freq: Four times a day (QID) | INTRAMUSCULAR | Status: DC | PRN

## 2022-04-06 MED ORDER — ENOXAPARIN SODIUM 40 MG/0.4ML IJ SOSY: 40.0000 mg | PREFILLED_SYRINGE | INTRAMUSCULAR | Status: DC

## 2022-04-06 MED ORDER — ONDANSETRON HCL 4 MG PO TABS
4.0000 mg | ORAL_TABLET | Freq: Four times a day (QID) | ORAL | Status: DC | PRN
  Administered 2022-04-06: 4 mg via ORAL
  Filled 2022-04-06: qty 1

## 2022-04-06 MED ORDER — BUDESONIDE 0.25 MG/2ML IN SUSP
0.2500 mg | Freq: Two times a day (BID) | RESPIRATORY_TRACT | Status: DC
  Administered 2022-04-06 – 2022-04-07 (×2): 0.25 mg via RESPIRATORY_TRACT
  Filled 2022-04-06 (×2): qty 2

## 2022-04-06 MED ORDER — ARFORMOTEROL TARTRATE 15 MCG/2ML IN NEBU
15.0000 ug | INHALATION_SOLUTION | Freq: Two times a day (BID) | RESPIRATORY_TRACT | Status: DC
  Administered 2022-04-06 – 2022-04-07 (×2): 15 ug via RESPIRATORY_TRACT
  Filled 2022-04-06 (×3): qty 2

## 2022-04-06 MED ORDER — GUAIFENESIN ER 600 MG PO TB12
600.0000 mg | ORAL_TABLET | Freq: Two times a day (BID) | ORAL | Status: DC
  Administered 2022-04-06 – 2022-04-07 (×4): 600 mg via ORAL
  Filled 2022-04-06 (×4): qty 1

## 2022-04-06 MED ORDER — IPRATROPIUM-ALBUTEROL 0.5-2.5 (3) MG/3ML IN SOLN
3.0000 mL | Freq: Three times a day (TID) | RESPIRATORY_TRACT | Status: DC
  Administered 2022-04-07: 3 mL via RESPIRATORY_TRACT
  Filled 2022-04-06: qty 3

## 2022-04-06 MED ORDER — IPRATROPIUM-ALBUTEROL 0.5-2.5 (3) MG/3ML IN SOLN
3.0000 mL | Freq: Four times a day (QID) | RESPIRATORY_TRACT | Status: DC
  Administered 2022-04-06 (×4): 3 mL via RESPIRATORY_TRACT
  Filled 2022-04-06 (×4): qty 3

## 2022-04-06 MED ORDER — HYDROCODONE-ACETAMINOPHEN 5-325 MG PO TABS
1.0000 | ORAL_TABLET | Freq: Every day | ORAL | Status: DC
  Administered 2022-04-06 (×2): 1 via ORAL
  Filled 2022-04-06 (×2): qty 1

## 2022-04-06 MED ORDER — PREDNISONE 20 MG PO TABS
40.0000 mg | ORAL_TABLET | Freq: Every day | ORAL | Status: DC
  Administered 2022-04-06 – 2022-04-07 (×2): 40 mg via ORAL
  Filled 2022-04-06 (×2): qty 2

## 2022-04-06 NOTE — Assessment & Plan Note (Addendum)
COPD with acute bronchitis Right pleural effusion Patient with shortness of breath and wheezing, WBC normal and procalcitonin less than 0.10 CTA chest showing perihilar consolidation and possible pneumonia.  Negative for PE Received Rocephin and azithromycin in the ED Given procalcitonin less than 0.1 will not continue antibiotics Suspect symptoms mostly related to COPD and pleural effusion Scheduled and as needed nebulized bronchodilators Steroids Arrange for cystoscopy with IR

## 2022-04-06 NOTE — Assessment & Plan Note (Signed)
Not currently on antihypertensives Patient is normotensive so we will continue to monitor

## 2022-04-06 NOTE — ED Notes (Signed)
Informed RN bed assigned 

## 2022-04-06 NOTE — Progress Notes (Signed)
PROGRESS NOTE    Jennifer Hodges  DGU:[id number] DOB: 1945/04/17 DOA: 04/05/2022 PCP: [name], MD    Brief Narrative:  77 y.o. 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  O2 sat when checked at home was 86%.  Patient has an associated congested cough but denies fever or chills.  Denies chest pain, lower extremity pain or swelling. Patient states she had a recent melanoma resection on her right shin earlier in the day. ED course and data review: Tachycardic to 112 with O2 sat 93% on room air and otherwise normal vitals.  Labs with troponin of 6 and BNP 115.  CBC and CMP unremarkable, procalcitonin less than 0.10 and respiratory viral panel negative for COVID flu and RSV., Personally viewed  EKG viewed and interpreted shows sinus tachycardia at 111 with no ischemic ST-T wave changes.  CTA chest PE protocol shows a moderate right pleural effusion, negative consolidative changes bilaterally which may represent pneumonia  Procalcitonin normal.  Antibiotics discontinued.  Patient does endorse some symptomatic improvement on 1/24.  Thoracentesis completed on this date.  0.8 L fluid removed.   Assessment & Plan:   Principal Problem:   Pneumonia Active Problems:   COPD with acute exacerbation (HCC)   Pleural effusion on right   Malignant neoplasm of upper lobe of left lung (HCC)   Benign essential hypertension  COPD with exacerbation Right pleural effusion Patient presented with shortness of breath and wheezing.  WBC normal, procalcitonin negative.  Antibiotics discontinued.  Patient received Rocephin and  azithromycin in the ED.  Symptoms likely related to underlying COPD and resultant pleural effusion. Plan: Treat as decompensated COPD.  Scheduled nebulizer therapy.  Daily steroids.  Monitor postthoracentesis.  Oxygen as necessary.  Possible discharge in 24 hours.  Malignant neoplasm of upper lobe of left lung (HCC) S/p radiation and chemo   Benign essential hypertension Not currently on antihypertensives Patient is normotensive so we will continue to monitor   DVT prophylaxis: SCD Code Status: DNR Family Communication: None Disposition Plan: Status is: Inpatient Remains inpatient appropriate because: Decompensated COPD.  Possible discharge in 24 hours.   Level of care: Med-Surg  Consultants:  None  Procedures:  Thoracentesis 1/24  Antimicrobials: None   Subjective: Seen and examined.  Sitting up in bed.  Endorses symptomatic improvement in respiratory status since admission.  No pain complaints.  Objective: Vitals:   04/06/22 1130 04/06/22 1132 04/06/22 1243 04/06/22 1309  BP: 103/70  116/75   Pulse: (!) 110  (!) 104   Resp: 20  18   Temp: 97.9 F (36.6 C)  98.1 F (36.7 C)   TempSrc: Oral  Oral   SpO2: (!) 88% 92% 93% 95%  Weight:      Height:        Intake/Output Summary (Last 24 hours) at 04/06/2022 1446 Last data filed at 04/06/2022 0441 Gross per 24 hour  Intake --  Output 50 ml  Net -50 ml   Filed Weights   04/05/22 1616  Weight: 77.1 kg    Examination:  General exam: Appears calm and comfortable  Respiratory system: End expiratory wheeze bilateral upper lung fields.  Lung sounds diminished on right.  Normal work of breathing. Cardiovascular system: S1-S2, RRR, no murmurs, no pedal edema Gastrointestinal system: Soft, NT/ND, normal bowel sounds Central nervous system: Alert and oriented. No focal neurological deficits. Extremities: Symmetric 5 x 5 power. Skin: No rashes, lesions or ulcers Psychiatry: Judgement and insight appear normal. Mood  & affect appropriate.     Data Reviewed: I have personally reviewed following labs and imaging studies  CBC: Recent Labs  Lab 04/05/22 1621 04/06/22 0342  WBC 4.4 4.2  NEUTROABS 3.2  --   HGB 13.0 12.4  HCT 40.3 38.3  MCV 92.2 91.2  PLT 294 505   Basic Metabolic Panel: Recent Labs  Lab 04/05/22 1621 04/06/22 0342  NA 139  --   K 3.8  --   CL 102  --   CO2 26  --   GLUCOSE 137*  --   BUN 12  --   CREATININE 0.53 0.57  CALCIUM 9.4  --    GFR: Estimated Creatinine Clearance: 55.3 mL/min (by C-G formula based on SCr of 0.57 mg/dL). Liver Function Tests: Recent Labs  Lab 04/05/22 1621  AST 23  ALT 11  ALKPHOS 91  BILITOT 0.7  PROT 6.6  ALBUMIN 3.9   No results for input(s): "LIPASE", "AMYLASE" in the last 168 hours. No results for input(s): "AMMONIA" in the last 168 hours. Coagulation Profile: Recent Labs  Lab 04/05/22 1621  INR 1.1   Cardiac Enzymes: No results for input(s): "CKTOTAL", "CKMB", "CKMBINDEX", "TROPONINI" in the last 168 hours. BNP (last 3 results) No results for input(s): "PROBNP" in the last 8760 hours. HbA1C: No results for input(s): "HGBA1C" in the last 72 hours. CBG: No results for input(s): "GLUCAP" in the last 168 hours. Lipid Profile: No results for input(s): "CHOL", "HDL", "LDLCALC", "TRIG", "CHOLHDL", "LDLDIRECT" in the last 72 hours. Thyroid Function Tests: No results for input(s): "TSH", "T4TOTAL", "FREET4", "T3FREE", "THYROIDAB" in the last 72 hours. Anemia Panel: No results for input(s): "VITAMINB12", "FOLATE", "FERRITIN", "TIBC", "IRON", "RETICCTPCT" in the last 72 hours. Sepsis Labs: Recent Labs  Lab 04/05/22 1821  PROCALCITON <0.10    Recent Results (from the past 240 hour(s))  Resp panel by RT-PCR (RSV, Flu A&B, Covid) Anterior Nasal Swab     Status: None   Collection Time: 04/05/22  4:21 PM   Specimen: Anterior Nasal Swab  Result Value Ref Range Status   SARS Coronavirus 2 by RT PCR NEGATIVE NEGATIVE Final     Comment: (NOTE) SARS-CoV-2 target nucleic acids are NOT DETECTED.  The SARS-CoV-2 RNA is generally detectable in upper respiratory specimens during the acute phase of infection. The lowest concentration of SARS-CoV-2 viral copies this assay can detect is 138 copies/mL. A negative result does not preclude SARS-Cov-2 infection and should not be used as the sole basis for treatment or other patient management decisions. A negative result may occur with  improper specimen collection/handling, submission of specimen other than nasopharyngeal swab, presence of viral mutation(s) within the areas targeted by this assay, and inadequate number of viral copies(<138 copies/mL). A negative result must be combined with clinical observations, patient history, and epidemiological information. The expected result is Negative.  Fact Sheet for Patients:  EntrepreneurPulse.com.au  Fact Sheet for Healthcare Providers:  IncredibleEmployment.be  This test is no t yet approved or cleared by the [place] FDA and  has been authorized for detection and/or diagnosis of SARS-CoV-2 by FDA under an Emergency Use Authorization (EUA). This EUA will remain  in effect (meaning this test can be used) for  the duration of the COVID-19 declaration under Section 564(b)(1) of the Act, 21 U.S.C.section 360bbb-3(b)(1), unless the authorization is terminated  or revoked sooner.       Influenza A by PCR NEGATIVE NEGATIVE Final   Influenza B by PCR NEGATIVE NEGATIVE Final    Comment: (NOTE) The Xpert Xpress SARS-CoV-2/FLU/RSV plus assay is intended as an aid in the diagnosis of influenza from Nasopharyngeal swab specimens and should not be used as a sole basis for treatment. Nasal washings and aspirates are unacceptable for Xpert Xpress SARS-CoV-2/FLU/RSV testing.  Fact Sheet for Patients: EntrepreneurPulse.com.au  Fact Sheet for Healthcare  Providers: IncredibleEmployment.be  This test is not yet approved or cleared by the [place] FDA and has been authorized for detection and/or diagnosis of SARS-CoV-2 by FDA under an Emergency Use Authorization (EUA). This EUA will remain in effect (meaning this test can be used) for the duration of the COVID-19 declaration under Section 564(b)(1) of the Act, 21 U.S.C. section 360bbb-3(b)(1), unless the authorization is terminated or revoked.     Resp Syncytial Virus by PCR NEGATIVE NEGATIVE Final    Comment: (NOTE) Fact Sheet for Patients: EntrepreneurPulse.com.au  Fact Sheet for Healthcare Providers: IncredibleEmployment.be  This test is not yet approved or cleared by the [place] FDA and has been authorized for detection and/or diagnosis of SARS-CoV-2 by FDA under an Emergency Use Authorization (EUA). This EUA will remain in effect (meaning this test can be used) for the duration of the COVID-19 declaration under Section 564(b)(1) of the Act, 21 U.S.C. section 360bbb-3(b)(1), unless the authorization is terminated or revoked.  Performed at [facility], [address] [place], [place] [address]          Radiology Studies: US THORACENTESIS [place] PLEURAL SPACE W/IMG GUIDE  Result Date: 04/06/2022 INDICATION: Patient with history of COPD, stage III adenocarcinoma of lung with vocal cord paralysis status post radiation, currently on chemotherapy. Patient presented to ED complaining of DOE found to have moderate right pleural effusion. Patient was referred to IR for diagnostic and therapeutic right thoracentesis. EXAM: ULTRASOUND GUIDED DIAGNOSTIC AND THERAPEUTIC RIGHT THORACENTESIS MEDICATIONS: 10 mL 1 % lidocaine COMPLICATIONS: SIR Level A - No therapy, no consequence. Postprocedural chest radiograph demonstrates development of a suspected ex vacuo hydropneumothorax within the right costophrenic angle.  PROCEDURE: An ultrasound guided thoracentesis was thoroughly discussed with the patient and questions answered. The benefits, risks, alternatives and complications were also discussed. The patient understands and wishes to proceed with the procedure. Written consent was obtained. Ultrasound was performed to localize and mark an adequate pocket of fluid in the right chest. The area was then prepped and draped in the normal sterile fashion. 1% Lidocaine was used for local anesthesia. Under ultrasound guidance a 6 Fr Safe-T-Centesis catheter was introduced. Thoracentesis was performed. The catheter was removed and a dressing applied. FINDINGS: A total of approximately 800 cc of clear, amber fluid was removed. Samples were sent to the laboratory as requested by the clinical team. IMPRESSION: Successful ultrasound guided right thoracentesis yielding 800 cc of pleural fluid. Procedure complicated by development of an asymptomatic suspected ex vacuo hydropneumothorax within the right costophrenic angle which was evaluated on subsequent delayed chest radiograph and found to reduce in volume and conspicuity. Read by: Narda Rutherford, AGNP-BC Electronically Signed   By: Sandi Mariscal M.D.   On: 04/06/2022 14:11   DG Chest Port 1 View  Result Date: 04/06/2022 CLINICAL DATA:  Pneumothorax on the right. Status post thoracentesis. EXAM: PORTABLE CHEST 1 VIEW  COMPARISON:  CXR 04/06/22 FINDINGS: No large pleural effusion. Interval decrease in size of the likely subdiaphragmatic pocket of air compared to same day chest radiograph. There may also be a trace right apical pneumothorax. Right-sided chest port with tip at the cavoatrial junction. Linear opacities at the right lung base are unchanged from prior exam. No displaced rib fractures. No new focal airspace opacity. Partially visualized cervical spinal fusion hardware in place. IMPRESSION: 1. Interval decrease in size of the likely subdiaphragmatic pocket of air compared to same day  chest radiograph. There may also be a trace right apical pneumothorax. 2. Unchanged linear opacities at the right lung base. Electronically Signed   By: Marin Roberts M.D.   On: 04/06/2022 14:01   DG Chest Port 1 View  Result Date: 04/06/2022 CLINICAL DATA:  RIGHT pleural effusion post thoracentesis EXAM: PORTABLE CHEST 1 VIEW COMPARISON:  Portable exam at 1043 hours compared to CT chest of 04/05/2022 FINDINGS: LEFT jugular Port-A-Cath with tip projecting over SVC. Stable heart size and pulmonary vascularity. Atherosclerotic calcification aorta. Bibasilar atelectasis much greater on RIGHT with chronic elevation of RIGHT diaphragm. Decreased RIGHT pleural effusion post thoracentesis. No apical pneumothorax identified. New gas collection identified lateral to liver, may represent a loculated pneumothorax at the posterolateral sulcus of the RIGHT lower lobe, which is low lying based on sagittal CT images. However, a loculated intra-abdominal collection is not entirely excluded. Osseous demineralization. IMPRESSION: Decreased RIGHT pleural effusion post thoracentesis. New loculated gas collection lateral to the liver, favor loculated pneumothorax in ED RIGHT posterolateral sulcus as noted on CT chest, though a subdiaphragmatic gas collection is not entirely excluded. Discussed with Dr. Ronny Bacon on 04/06/2022 at 1110 hours; plan for short-term follow-up chest radiograph assessment. Electronically Signed   By: Lavonia Dana M.D.   On: 04/06/2022 11:14   CT Angio Chest PE W/Cm &/Or Wo Cm  Result Date: 04/05/2022 CLINICAL DATA:  Concern for pulmonary embolism. Metastatic lung cancer. EXAM: CT ANGIOGRAPHY CHEST WITH CONTRAST TECHNIQUE: Multidetector CT imaging of the chest was performed using the standard protocol during bolus administration of intravenous contrast. Multiplanar CT image reconstructions and MIPs were obtained to evaluate the vascular anatomy. RADIATION DOSE REDUCTION: This exam was performed according  to the departmental dose-optimization program which includes automated exposure control, adjustment of the mA and/or kV according to patient size and/or use of iterative reconstruction technique. CONTRAST:  15mL OMNIPAQUE IOHEXOL 350 MG/ML SOLN COMPARISON:  CT dated 02/15/2022. FINDINGS: Cardiovascular: There is no cardiomegaly or pericardial effusion. There is coronary vascular calcification. Small pericardial effusion measuring 7 mm in thickness, increased since the prior CT. Mild atherosclerotic calcification of the thoracic aorta. No aneurysmal dilatation or dissection. The origins of the great vessels of the aortic arch are patent. No pulmonary artery embolus identified. Mediastinum/Nodes: Evaluation of the hilar and mediastinal lymph nodes is limited due to consolidative changes of the lungs. The esophagus is grossly unremarkable. No mediastinal fluid collection. Lungs/Pleura: Background of emphysema. Moderate right pleural effusion, significantly increased since the prior CT. Small left pleural effusion. Post treatment changes in the lungs and perihilar regions bilaterally. There is however interval increase consolidative changes of the perihilar region bilaterally which may represent superimposed pneumonia. Recurrent disease is not excluded clinical correlation is recommended. Increased consolidation of the right upper lobe and left lower lobe. No pneumothorax. There is mucous impaction of the segmental left lower lobe bronchus. Additionally there is narrowing of the right lower lobe bronchus. Upper Abdomen: Cholecystectomy. Musculoskeletal: Degenerative changes of the  spine. Partially visualized lower cervical ACDF. No acute osseous pathology. Review of the MIP images confirms the above findings. IMPRESSION: 1. No CT evidence of pulmonary artery embolus. 2. Moderate right pleural effusion, significantly increased since the prior CT. Small left pleural effusion. 3. Post treatment changes in the lungs and  perihilar regions bilaterally. There is interval increase consolidative changes of the perihilar region bilaterally which may represent superimposed pneumonia. Recurrent disease is not excluded. Clinical correlation is recommended. 4.  Aortic Atherosclerosis (ICD10-I70.0). Electronically Signed   By: Anner Crete M.D.   On: 04/05/2022 22:56   DG Chest 2 View  Result Date: 04/05/2022 CLINICAL DATA:  Shortness of breath and cough EXAM: CHEST - 2 VIEW COMPARISON:  X-ray 02/09/2022 and older.  CT scan December 2023 FINDINGS: Fixation hardware along the cervical spine. Right IJ chest port with tip at the central SVC. Once again there is volume loss right hemithorax with pleural thickening or small effusions. Nodular fullness of the right lung hilum. Interstitial changes. Left lung is grossly clear. Tenting of the left hemidiaphragm. No pneumothorax. Stable cardiopericardial silhouette. Degenerative changes of the spine on lateral view. IMPRESSION: Slight increase in right-sided pleural effusion Electronically Signed   By: Jill Side M.D.   On: 04/05/2022 17:06        Scheduled Meds:  arformoterol  15 mcg Nebulization BID   budesonide (PULMICORT) nebulizer solution  0.25 mg Nebulization BID   guaiFENesin  600 mg Oral BID   HYDROcodone-acetaminophen  1 tablet Oral QHS   ipratropium-albuterol  3 mL Nebulization Q6H   predniSONE  40 mg Oral Q breakfast   Continuous Infusions:   LOS: 1 day    Sidney Ace, MD [facility]   If 7PM-7AM, please contact night-coverage  04/06/2022, 2:46 PM

## 2022-04-06 NOTE — Procedures (Signed)
PROCEDURE SUMMARY:  Successful US guided diagnostic and therapeutic right thoracentesis. Yielded 800 cc of clear, amber fluid. Pt tolerated procedure well. No immediate complications.  Specimen was sent for labs. CXR ordered.  EBL < 1 mL  Tyson Alias, AGNP 04/06/2022 10:50 AM

## 2022-04-06 NOTE — Assessment & Plan Note (Signed)
S/p radiation and chemo

## 2022-04-07 ENCOUNTER — Inpatient Hospital Stay: Payer: Medicare Other

## 2022-04-07 DIAGNOSIS — J441 Chronic obstructive pulmonary disease with (acute) exacerbation: Secondary | ICD-10-CM | POA: Diagnosis not present

## 2022-04-07 MED ORDER — PREDNISONE 20 MG PO TABS: 40.0000 mg | ORAL_TABLET | Freq: Every day | ORAL | 0 refills | Status: AC

## 2022-04-07 NOTE — Discharge Summary (Signed)
Physician Discharge Summary  LALISA KIEHN ZOX:[id number] DOB: 1946-01-28 DOA: 04/05/2022  PCP: [name], MD  Admit date: 04/05/2022 Discharge date: 04/07/2022  Admitted From: Home Disposition:  Home  Recommendations for Outpatient Follow-up:  Follow up with PCP in 1-2 weeks   Home Health:No Equipment/Devices:None   Discharge Condition:Stable  CODE STATUS:DNR Diet recommendation: Reg  Brief/Interim Summary:  77 y.o. 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  O2 sat when checked at home was 86%.  Patient has an associated congested cough but denies fever or chills.  Denies chest pain, lower extremity pain or swelling. Patient states she had a recent melanoma resection on her right shin earlier in the day. ED course and data review: Tachycardic to 112 with O2 sat 93% on room air and otherwise normal vitals.  Labs with troponin of 6 and BNP 115.  CBC and CMP unremarkable, procalcitonin less than 0.10 and respiratory viral panel negative for COVID flu and RSV., Personally viewed  EKG viewed and interpreted shows sinus tachycardia at 111 with no ischemic ST-T wave changes.  CTA chest PE protocol shows a moderate right pleural effusion, negative consolidative changes bilaterally which may represent pneumonia   Procalcitonin normal.  Antibiotics discontinued.  Patient does endorse some symptomatic improvement on 1/24.  Thoracentesis completed on this date.  0.8 L fluid removed.  Patient's respiratory status back to baseline at time of discharge.  Lung sounds improved.  Repeat chest x-ray demonstrates possibility  of persistent right pleural effusion but patient's respiratory status is stable.  Breath sounds return to normal.  Patient was on room air.  Ambulating without difficulty.  Appropriate for discharge home at this time.  Will recommend short course of p.o. prednisone and resumption of home bronchodilator regimen.  Follow-up outpatient PCP and pulmonology.   Discharge Diagnoses:  Principal Problem:   Pneumonia Active Problems:   COPD with acute exacerbation (HCC)   Pleural effusion on right   Malignant neoplasm of upper lobe of left lung (HCC)   Benign essential hypertension  COPD with exacerbation Right pleural effusion Patient presented with shortness of breath and wheezing.  WBC normal, procalcitonin negative.  Antibiotics discontinued.  Patient received Rocephin and azithromycin in the ED.  Symptoms likely related to underlying COPD and resultant pleural effusion. Plan: Patient responded to treatment for decompensated COPD.  At time of discharge will transition to p.o. prednisone.  Additional 4 days prescribed.  Patient will resume her home bronchodilator regimen.  Will follow-up outpatient PCP and pulmonology.   Malignant neoplasm of upper lobe of left lung [facility]) S/p radiation and chemo   Benign essential hypertension Not currently on antihypertensives Patient is normotensive so we will continue to monitor  Discharge Instructions  Discharge Instructions     Diet - low sodium heart healthy   Complete by: As directed    Increase activity slowly   Complete by: As directed       Allergies as of 04/07/2022       Reactions   Aspirin Hives, Shortness Of Breath, Other (See Comments)   Difficulty breathing   Celebrex [celecoxib] Shortness Of Breath   Morphine And Related Nausea And Vomiting, Swelling   Adhesive [  tape] Other (See Comments)   Took top layer of skin off when removed.   Clarithromycin Nausea And Vomiting   Codeine Nausea And Vomiting   Darvon [propoxyphene Hcl]  Nausea And Vomiting   Demerol [meperidine] Nausea And Vomiting   Flonase [fluticasone Propionate] Other (See Comments)   Fungal infection in nose   Simvastatin Other (See Comments)   "caused ulcers in mouth, and fever"   Talwin [pentazocine] Nausea And Vomiting        Medication List     STOP taking these medications    carboxymethylcellulose 0.5 % Soln Commonly known as: REFRESH PLUS   Selenium 200 MCG Caps       TAKE these medications    acetaminophen 500 MG tablet Commonly known as: TYLENOL Take 500 mg by mouth 2 (two) times daily.   albuterol 108 (90 Base) MCG/ACT inhaler Commonly known as: VENTOLIN HFA Inhale 2 puffs into the lungs every 6 (six) hours as needed for wheezing or shortness of breath.   [place] Dream Arthritis 0.025 % Crea Generic drug: Histamine Dihydrochloride Apply 1 application. topically 4 (four) times daily as needed (pain).   CAL-MAG-ZINC PO Take 1 tablet by mouth daily.   cephALEXin 500 MG capsule Commonly known as: KEFLEX Take 500 mg by mouth 2 (two) times daily.   Co Q 10 100 MG Caps Take 100 mg by mouth daily.   cyclobenzaprine 10 MG tablet Commonly known as: FLEXERIL Take 10 mg by mouth at bedtime.   DHEA 25 MG Caps Take 25 mg by mouth daily.   EQ FIBER POWDER PO Take 1 Dose by mouth as needed.   EQL Vitamin D3 25 MCG (1000 UT) capsule Generic drug: Cholecalciferol Take 1,000 Units by mouth daily.   ESTER C PO Take 1 tablet by mouth daily. 500 mg   ezetimibe 10 MG tablet Commonly known as: ZETIA Take 5 mg by mouth in the morning and at bedtime.   folic acid 716 MCG tablet Commonly known as: FOLVITE Take 400 mcg by mouth daily.   HYDROcodone-acetaminophen 5-325 MG tablet Commonly known as: NORCO/VICODIN Take 1 tablet by mouth at bedtime.   L-Carnitine 250 MG Tabs Take 1 tablet by mouth daily. With Chromium 250 mg   loratadine 10 MG tablet Commonly known as: CLARITIN Take 10 mg by mouth daily.    pantoprazole 40 MG tablet Commonly known as: PROTONIX Take 40 mg by mouth 2 (two) times daily.   polyethylene glycol powder 17 GM/SCOOP powder Commonly known as: GLYCOLAX/MIRALAX Take 17 g by mouth daily as needed for moderate constipation.   predniSONE 20 MG tablet Commonly known as: DELTASONE Take 2 tablets (40 mg total) by mouth daily with breakfast for 4 days.   pyridOXINE 100 MG tablet Commonly known as: VITAMIN B6 Take 100 mg by mouth daily.   Senna 8.6 MG Caps Take 1 capsule by mouth daily as needed (constipation).   Stiolto Respimat 2.5-2.5 MCG/ACT Aers Generic drug: Tiotropium Bromide-Olodaterol Inhale 2 puffs into the lungs daily.   sucralfate 1 GM/10ML suspension Commonly known as: CARAFATE Take 1 g by mouth daily as needed.   Systane Hydration PF 0.4-0.3 % Soln Generic drug: Polyethyl Glyc-Propyl Glyc PF Place 1 drop into both eyes as needed. as   triamcinolone 55 MCG/ACT Aero nasal inhaler Commonly known as: NASACORT Place 2 sprays into the nose daily as needed.   Ubrelvy 100 MG Tabs Generic drug: Ubrogepant Take 100 mg by mouth daily as needed (migraines).   Vitamin A  2400 MCG (8000 UT) Caps Take 8,000 Units by mouth daily.   Vitamin B-12 2500 MCG Subl Place 2,500 mcg under the tongue daily.   Vitamin E 200 units Tabs Take 200 Units by mouth daily.   Zofran 8 MG tablet Generic drug: ondansetron Take by mouth every 8 (eight) hours as needed for nausea or vomiting.        Allergies  Allergen Reactions   Aspirin Hives, Shortness Of Breath and Other (See Comments)    Difficulty breathing   Celebrex [Celecoxib] Shortness Of Breath   Morphine And Related Nausea And Vomiting and Swelling   Adhesive [Tape] Other (See Comments)    Took top layer of skin off when removed.   Clarithromycin Nausea And Vomiting   Codeine Nausea And Vomiting   Darvon [Propoxyphene Hcl] Nausea And Vomiting   Demerol [Meperidine] Nausea And Vomiting   Flonase  [Fluticasone Propionate] Other (See Comments)    Fungal infection in nose   Simvastatin Other (See Comments)    "caused ulcers in mouth, and fever"   Talwin [Pentazocine] Nausea And Vomiting    Consultations: None   Procedures/Studies: DG Chest Port 1 View  Result Date: 04/07/2022 CLINICAL DATA:  Follow-up pneumothorax EXAM: PORTABLE CHEST 1 VIEW COMPARISON:  04/06/2022 FINDINGS: Right chest port remains in place. Stable heart size. Aortic atherosclerosis. Near complete resolution of previously seen subdiaphragmatic air collection at the right lung base. Increasing right-sided pleural effusion. Persistent right greater than left bibasilar opacities. IMPRESSION: 1. Increasing right-sided pleural effusion. 2. Near complete resolution of previously seen subdiaphragmatic air collection at the right lung base. Electronically Signed   By: Davina Poke D.O.   On: 04/07/2022 08:50   US THORACENTESIS ASP PLEURAL SPACE W/IMG GUIDE  Result Date: 04/06/2022 INDICATION: Patient with history of COPD, stage III adenocarcinoma of lung with vocal cord paralysis status post radiation, currently on chemotherapy. Patient presented to ED complaining of DOE found to have moderate right pleural effusion. Patient was referred to IR for diagnostic and therapeutic right thoracentesis. EXAM: ULTRASOUND GUIDED DIAGNOSTIC AND THERAPEUTIC RIGHT THORACENTESIS MEDICATIONS: 10 mL 1 % lidocaine COMPLICATIONS: SIR Level A - No therapy, no consequence. Postprocedural chest radiograph demonstrates development of a suspected ex vacuo hydropneumothorax within the right costophrenic angle. PROCEDURE: An ultrasound guided thoracentesis was thoroughly discussed with the patient and questions answered. The benefits, risks, alternatives and complications were also discussed. The patient understands and wishes to proceed with the procedure. Written consent was obtained. Ultrasound was performed to localize and mark an adequate pocket of  fluid in the right chest. The area was then prepped and draped in the normal sterile fashion. 1% Lidocaine was used for local anesthesia. Under ultrasound guidance a 6 Fr Safe-T-Centesis catheter was introduced. Thoracentesis was performed. The catheter was removed and a dressing applied. FINDINGS: A total of approximately 800 cc of clear, amber fluid was removed. Samples were sent to the laboratory as requested by the clinical team. IMPRESSION: Successful ultrasound guided right thoracentesis yielding 800 cc of pleural fluid. Procedure complicated by development of an asymptomatic suspected ex vacuo hydropneumothorax within the right costophrenic angle which was evaluated on subsequent delayed chest radiograph and found to reduce in volume and conspicuity. Read by: Narda Rutherford, AGNP-BC Electronically Signed   By: Sandi Mariscal M.D.   On: 04/06/2022 14:11   DG Chest Port 1 View  Result Date: 04/06/2022 CLINICAL DATA:  Pneumothorax on the right. Status post thoracentesis. EXAM: PORTABLE CHEST 1 VIEW COMPARISON:  CXR 04/06/22 FINDINGS:  No large pleural effusion. Interval decrease in size of the likely subdiaphragmatic pocket of air compared to same day chest radiograph. There may also be a trace right apical pneumothorax. Right-sided chest port with tip at the cavoatrial junction. Linear opacities at the right lung base are unchanged from prior exam. No displaced rib fractures. No new focal airspace opacity. Partially visualized cervical spinal fusion hardware in place. IMPRESSION: 1. Interval decrease in size of the likely subdiaphragmatic pocket of air compared to same day chest radiograph. There may also be a trace right apical pneumothorax. 2. Unchanged linear opacities at the right lung base. Electronically Signed   By: Marin Roberts M.D.   On: 04/06/2022 14:01   DG Chest Port 1 View  Result Date: 04/06/2022 CLINICAL DATA:  RIGHT pleural effusion post thoracentesis EXAM: PORTABLE CHEST 1 VIEW COMPARISON:   Portable exam at 1043 hours compared to CT chest of 04/05/2022 FINDINGS: LEFT jugular Port-A-Cath with tip projecting over SVC. Stable heart size and pulmonary vascularity. Atherosclerotic calcification aorta. Bibasilar atelectasis much greater on RIGHT with chronic elevation of RIGHT diaphragm. Decreased RIGHT pleural effusion post thoracentesis. No apical pneumothorax identified. New gas collection identified lateral to liver, may represent a loculated pneumothorax at the posterolateral sulcus of the RIGHT lower lobe, which is low lying based on sagittal CT images. However, a loculated intra-abdominal collection is not entirely excluded. Osseous demineralization. IMPRESSION: Decreased RIGHT pleural effusion post thoracentesis. New loculated gas collection lateral to the liver, favor loculated pneumothorax in ED RIGHT posterolateral sulcus as noted on CT chest, though a subdiaphragmatic gas collection is not entirely excluded. Discussed with Dr. Ronny Bacon on 04/06/2022 at 1110 hours; plan for short-term follow-up chest radiograph assessment. Electronically Signed   By: Lavonia Dana M.D.   On: 04/06/2022 11:14   CT Angio Chest PE W/Cm &/Or Wo Cm  Result Date: 04/05/2022 CLINICAL DATA:  Concern for pulmonary embolism. Metastatic lung cancer. EXAM: CT ANGIOGRAPHY CHEST WITH CONTRAST TECHNIQUE: Multidetector CT imaging of the chest was performed using the standard protocol during bolus administration of intravenous contrast. Multiplanar CT image reconstructions and MIPs were obtained to evaluate the vascular anatomy. RADIATION DOSE REDUCTION: This exam was performed according to the departmental dose-optimization program which includes automated exposure control, adjustment of the mA and/or kV according to patient size and/or use of iterative reconstruction technique. CONTRAST:  36mL OMNIPAQUE IOHEXOL 350 MG/ML SOLN COMPARISON:  CT dated 02/15/2022. FINDINGS: Cardiovascular: There is no cardiomegaly or pericardial  effusion. There is coronary vascular calcification. Small pericardial effusion measuring 7 mm in thickness, increased since the prior CT. Mild atherosclerotic calcification of the thoracic aorta. No aneurysmal dilatation or dissection. The origins of the great vessels of the aortic arch are patent. No pulmonary artery embolus identified. Mediastinum/Nodes: Evaluation of the hilar and mediastinal lymph nodes is limited due to consolidative changes of the lungs. The esophagus is grossly unremarkable. No mediastinal fluid collection. Lungs/Pleura: Background of emphysema. Moderate right pleural effusion, significantly increased since the prior CT. Small left pleural effusion. Post treatment changes in the lungs and perihilar regions bilaterally. There is however interval increase consolidative changes of the perihilar region bilaterally which may represent superimposed pneumonia. Recurrent disease is not excluded clinical correlation is recommended. Increased consolidation of the right upper lobe and left lower lobe. No pneumothorax. There is mucous impaction of the segmental left lower lobe bronchus. Additionally there is narrowing of the right lower lobe bronchus. Upper Abdomen: Cholecystectomy. Musculoskeletal: Degenerative changes of the spine. Partially visualized lower cervical  ACDF. No acute osseous pathology. Review of the MIP images confirms the above findings. IMPRESSION: 1. No CT evidence of pulmonary artery embolus. 2. Moderate right pleural effusion, significantly increased since the prior CT. Small left pleural effusion. 3. Post treatment changes in the lungs and perihilar regions bilaterally. There is interval increase consolidative changes of the perihilar region bilaterally which may represent superimposed pneumonia. Recurrent disease is not excluded. Clinical correlation is recommended. 4.  Aortic Atherosclerosis (ICD10-I70.0). Electronically Signed   By: Anner Crete M.D.   On: 04/05/2022 22:56    DG Chest 2 View  Result Date: 04/05/2022 CLINICAL DATA:  Shortness of breath and cough EXAM: CHEST - 2 VIEW COMPARISON:  X-ray 02/09/2022 and older.  CT scan December 2023 FINDINGS: Fixation hardware along the cervical spine. Right IJ chest port with tip at the central SVC. Once again there is volume loss right hemithorax with pleural thickening or small effusions. Nodular fullness of the right lung hilum. Interstitial changes. Left lung is grossly clear. Tenting of the left hemidiaphragm. No pneumothorax. Stable cardiopericardial silhouette. Degenerative changes of the spine on lateral view. IMPRESSION: Slight increase in right-sided pleural effusion Electronically Signed   By: Jill Side M.D.   On: 04/05/2022 17:06      Subjective: Seen and examined at time of discharge.  Stable no distress.  Stable for discharge home.  Discharge Exam: Vitals:   04/07/22 0540 04/07/22 0858  BP: (!) 103/49 (!) 105/52  Pulse: 97 96  Resp: 16 16  Temp: 98.4 F (36.9 C) 98.2 F (36.8 C)  SpO2: 91% 91%   Vitals:   04/06/22 1928 04/06/22 2122 04/07/22 0540 04/07/22 0858  BP: 112/63  (!) 103/49 (!) 105/52  Pulse: (!) 102  97 96  Resp: 16  16 16   Temp: 97.7 F (36.5 C)  98.4 F (36.9 C) 98.2 F (36.8 C)  TempSrc: Oral   Oral  SpO2: 92% 93% 91% 91%  Weight:      Height:        General: Pt is alert, awake, not in acute distress Cardiovascular: RRR, S1/S2 +, no rubs, no gallops Respiratory: CTA bilaterally, no wheezing, no rhonchi Abdominal: Soft, NT, ND, bowel sounds + Extremities: no edema, no cyanosis    The results of significant diagnostics from this hospitalization (including imaging, microbiology, ancillary and laboratory) are listed below for reference.     Microbiology: Recent Results (from the past 240 hour(s))  Resp panel by RT-PCR (RSV, Flu A&B, Covid) Anterior Nasal Swab     Status: None   Collection Time: 04/05/22  4:21 PM   Specimen: Anterior Nasal Swab  Result Value Ref  Range Status   SARS Coronavirus 2 by RT PCR NEGATIVE NEGATIVE Final    Comment: (NOTE) SARS-CoV-2 target nucleic acids are NOT DETECTED.  The SARS-CoV-2 RNA is generally detectable in upper respiratory specimens during the acute phase of infection. The lowest concentration of SARS-CoV-2 viral copies this assay can detect is 138 copies/mL. A negative result does not preclude SARS-Cov-2 infection and should not be used as the sole basis for treatment or other patient management decisions. A negative result may occur with  improper specimen collection/handling, submission of specimen other than nasopharyngeal swab, presence of viral mutation(s) within the areas targeted by this assay, and inadequate number of viral copies(<138 copies/mL). A negative result must be combined with clinical observations, patient history, and epidemiological information. The expected result is Negative.  Fact Sheet for Patients:  EntrepreneurPulse.com.au  Fact Sheet for  Healthcare Providers:  IncredibleEmployment.be  This test is no t yet approved or cleared by the [place] and  has been authorized for detection and/or diagnosis of SARS-CoV-2 by FDA under an Emergency Use Authorization (EUA). This EUA will remain  in effect (meaning this test can be used) for the duration of the COVID-19 declaration under Section 564(b)(1) of the Act, 21 U.S.C.section 360bbb-3(b)(1), unless the authorization is terminated  or revoked sooner.       Influenza A by PCR NEGATIVE NEGATIVE Final   Influenza B by PCR NEGATIVE NEGATIVE Final    Comment: (NOTE) The Xpert Xpress SARS-CoV-2/FLU/RSV plus assay is intended as an aid in the diagnosis of influenza from Nasopharyngeal swab specimens and should not be used as a sole basis for treatment. Nasal washings and aspirates are unacceptable for Xpert Xpress SARS-CoV-2/FLU/RSV testing.  Fact Sheet for  Patients: EntrepreneurPulse.com.au  Fact Sheet for Healthcare Providers: IncredibleEmployment.be  This test is not yet approved or cleared by the [place] FDA and has been authorized for detection and/or diagnosis of SARS-CoV-2 by FDA under an Emergency Use Authorization (EUA). This EUA will remain in effect (meaning this test can be used) for the duration of the COVID-19 declaration under Section 564(b)(1) of the Act, 21 U.S.C. section 360bbb-3(b)(1), unless the authorization is terminated or revoked.     Resp Syncytial Virus by PCR NEGATIVE NEGATIVE Final    Comment: (NOTE) Fact Sheet for Patients: EntrepreneurPulse.com.au  Fact Sheet for Healthcare Providers: IncredibleEmployment.be  This test is not yet approved or cleared by the [place] FDA and has been authorized for detection and/or diagnosis of SARS-CoV-2 by FDA under an Emergency Use Authorization (EUA). This EUA will remain in effect (meaning this test can be used) for the duration of the COVID-19 declaration under Section 564(b)(1) of the Act, 21 U.S.C. section 360bbb-3(b)(1), unless the authorization is terminated or revoked.  Performed at [facility], [address] [place], [place] [address]   Body fluid culture w Gram Stain     Status: None (Preliminary result)   Collection Time: 04/06/22 10:44 AM   Specimen: PATH Cytology Pleural fluid  Result Value Ref Range Status   Specimen Description   Final    PLEURAL Performed at [facility], [address], [place], [place] [address]    Special Requests   Final    PLEURAL Performed at [facility], [address] [place], [place] [address]    Gram Stain   Final    FEW WBC PRESENT, PREDOMINANTLY MONONUCLEAR NO ORGANISMS SEEN    Culture   Final    NO GROWTH < 24 HOURS Performed at [facility], [place] [address], [place],  [address]     Report Status PENDING  Incomplete     Labs: BNP (last 3 results) Recent Labs    02/04/22 1028 04/05/22 1621  BNP 130.6* 035.0*   Basic Metabolic Panel: Recent Labs  Lab 04/05/22 1621 04/06/22 0342  NA 139  --   K 3.8  --   CL 102  --   CO2 26  --   GLUCOSE 137*  --   BUN 12  --   CREATININE 0.53 0.57  CALCIUM 9.4  --    Liver Function Tests: Recent Labs  Lab 04/05/22 1621  AST 23  ALT 11  ALKPHOS 91  BILITOT 0.7  PROT 6.6  ALBUMIN 3.9   No results for input(s): "LIPASE", "AMYLASE" in the last 168 hours. No  results for input(s): "AMMONIA" in the last 168 hours. CBC: Recent Labs  Lab 04/05/22 1621 04/06/22 0342  WBC 4.4 4.2  NEUTROABS 3.2  --   HGB 13.0 12.4  HCT 40.3 38.3  MCV 92.2 91.2  PLT 294 263   Cardiac Enzymes: No results for input(s): "CKTOTAL", "CKMB", "CKMBINDEX", "TROPONINI" in the last 168 hours. BNP: Invalid input(s): "POCBNP" CBG: No results for input(s): "GLUCAP" in the last 168 hours. D-Dimer No results for input(s): "DDIMER" in the last 72 hours. Hgb A1c No results for input(s): "HGBA1C" in the last 72 hours. Lipid Profile No results for input(s): "CHOL", "HDL", "LDLCALC", "TRIG", "CHOLHDL", "LDLDIRECT" in the last 72 hours. Thyroid function studies No results for input(s): "TSH", "T4TOTAL", "T3FREE", "THYROIDAB" in the last 72 hours.  Invalid input(s): "FREET3" Anemia work up No results for input(s): "VITAMINB12", "FOLATE", "FERRITIN", "TIBC", "IRON", "RETICCTPCT" in the last 72 hours. Urinalysis    Component Value Date/Time   COLORURINE YELLOW (A) 04/06/2022 0428   APPEARANCEUR CLEAR (A) 04/06/2022 0428   APPEARANCEUR Clear 09/20/2021 1449   LABSPEC 1.030 04/06/2022 0428   PHURINE 5.0 04/06/2022 0428   GLUCOSEU NEGATIVE 04/06/2022 0428   GLUCOSEU NEGATIVE 06/19/2014 1002   HGBUR NEGATIVE 04/06/2022 0428   BILIRUBINUR NEGATIVE 04/06/2022 0428   BILIRUBINUR Negative 09/20/2021 1449   KETONESUR 5 (A) 04/06/2022 0428    PROTEINUR NEGATIVE 04/06/2022 0428   UROBILINOGEN 1.0 03/04/2015 1339   UROBILINOGEN 0.2 06/19/2014 1002   NITRITE NEGATIVE 04/06/2022 0428   LEUKOCYTESUR TRACE (A) 04/06/2022 0428   Sepsis Labs Recent Labs  Lab 04/05/22 1621 04/06/22 0342  WBC 4.4 4.2   Microbiology Recent Results (from the past 240 hour(s))  Resp panel by RT-PCR (RSV, Flu A&B, Covid) Anterior Nasal Swab     Status: None   Collection Time: 04/05/22  4:21 PM   Specimen: Anterior Nasal Swab  Result Value Ref Range Status   SARS Coronavirus 2 by RT PCR NEGATIVE NEGATIVE Final    Comment: (NOTE) SARS-CoV-2 target nucleic acids are NOT DETECTED.  The SARS-CoV-2 RNA is generally detectable in upper respiratory specimens during the acute phase of infection. The lowest concentration of SARS-CoV-2 viral copies this assay can detect is 138 copies/mL. A negative result does not preclude SARS-Cov-2 infection and should not be used as the sole basis for treatment or other patient management decisions. A negative result may occur with  improper specimen collection/handling, submission of specimen other than nasopharyngeal swab, presence of viral mutation(s) within the areas targeted by this assay, and inadequate number of viral copies(<138 copies/mL). A negative result must be combined with clinical observations, patient history, and epidemiological information. The expected result is Negative.  Fact Sheet for Patients:  EntrepreneurPulse.com.au  Fact Sheet for Healthcare Providers:  IncredibleEmployment.be  This test is no t yet approved or cleared by the [place] FDA and  has been authorized for detection and/or diagnosis of SARS-CoV-2 by FDA under an Emergency Use Authorization (EUA). This EUA will remain  in effect (meaning this test can be used) for the duration of the COVID-19 declaration under Section 564(b)(1) of the Act, 21 U.S.C.section 360bbb-3(b)(1), unless the  authorization is terminated  or revoked sooner.       Influenza A by PCR NEGATIVE NEGATIVE Final   Influenza B by PCR NEGATIVE NEGATIVE Final    Comment: (NOTE) The Xpert Xpress SARS-CoV-2/FLU/RSV plus assay is intended as an aid in the diagnosis of influenza from Nasopharyngeal swab specimens and should not be used as a  sole basis for treatment. Nasal washings and aspirates are unacceptable for Xpert Xpress SARS-CoV-2/FLU/RSV testing.  Fact Sheet for Patients: EntrepreneurPulse.com.au  Fact Sheet for Healthcare Providers: IncredibleEmployment.be  This test is not yet approved or cleared by the [place] FDA and has been authorized for detection and/or diagnosis of SARS-CoV-2 by FDA under an Emergency Use Authorization (EUA). This EUA will remain in effect (meaning this test can be used) for the duration of the COVID-19 declaration under Section 564(b)(1) of the Act, 21 U.S.C. section 360bbb-3(b)(1), unless the authorization is terminated or revoked.     Resp Syncytial Virus by PCR NEGATIVE NEGATIVE Final    Comment: (NOTE) Fact Sheet for Patients: EntrepreneurPulse.com.au  Fact Sheet for Healthcare Providers: IncredibleEmployment.be  This test is not yet approved or cleared by the [place] FDA and has been authorized for detection and/or diagnosis of SARS-CoV-2 by FDA under an Emergency Use Authorization (EUA). This EUA will remain in effect (meaning this test can be used) for the duration of the COVID-19 declaration under Section 564(b)(1) of the Act, 21 U.S.C. section 360bbb-3(b)(1), unless the authorization is terminated or revoked.  Performed at [facility], [address] [place], [place] [address]   Body fluid culture w Gram Stain     Status: None (Preliminary result)   Collection Time: 04/06/22 10:44 AM   Specimen: PATH Cytology Pleural fluid  Result Value Ref Range  Status   Specimen Description   Final    PLEURAL Performed at [facility], [address], [place], [place] [address]    Special Requests   Final    PLEURAL Performed at [facility], [address] [place], [place] [address]    Gram Stain   Final    FEW WBC PRESENT, PREDOMINANTLY MONONUCLEAR NO ORGANISMS SEEN    Culture   Final    NO GROWTH < 24 HOURS Performed at [facility], [place] [address], [place], [place] [address]    Report Status PENDING  Incomplete     Time coordinating discharge: Over 30 minutes  SIGNED:   Sidney Ace, MD  [facility] 04/07/2022, 1:39 PM Pager   If 7PM-7AM, please contact night-coverage

## 2022-04-07 NOTE — Plan of Care (Signed)
  Problem: Education: Goal: Knowledge of disease or condition will improve Outcome: Progressing Goal: Knowledge of the prescribed therapeutic regimen will improve Outcome: Progressing Goal: Individualized Educational Video(s) Outcome: Progressing   Problem: Activity: Goal: Ability to tolerate increased activity will improve Outcome: Progressing

## 2022-04-07 NOTE — TOC CM/SW Note (Signed)
Patient has orders to discharge home today. Chart reviewed. No TOC needs identified. CSW signing off.  Dayton Scrape, [place]

## 2022-04-07 NOTE — Progress Notes (Incomplete)
Jennifer Hodges to be discharged Home per MD order. Discussed prescriptions and follow up appointments with the patient. Prescriptions given to patient, medication list explained in detail. Patient verbalized understanding.  Allergies as of 04/07/2022       Reactions   Aspirin Hives, Shortness Of Breath, Other (See Comments)   Difficulty breathing   Celebrex [celecoxib] Shortness Of Breath   Morphine And Related Nausea And Vomiting, Swelling   Adhesive [tape] Other (See Comments)   Took top layer of skin off when removed.   Clarithromycin Nausea And Vomiting   Codeine Nausea And Vomiting   Darvon [propoxyphene Hcl] Nausea And Vomiting   Demerol [meperidine] Nausea And Vomiting   Flonase [fluticasone Propionate] Other (See Comments)   Fungal infection in nose   Simvastatin Other (See Comments)   "caused ulcers in mouth, and fever"   Talwin [pentazocine] Nausea And Vomiting        Medication List     STOP taking these medications    carboxymethylcellulose 0.5 % Soln Commonly known as: REFRESH PLUS   Selenium 200 MCG Caps       TAKE these medications    acetaminophen 500 MG tablet Commonly known as: TYLENOL Take 500 mg by mouth 2 (two) times daily.   albuterol 108 (90 Base) MCG/ACT inhaler Commonly known as: VENTOLIN HFA Inhale 2 puffs into the lungs every 6 (six) hours as needed for wheezing or shortness of breath.   [place] Dream Arthritis 0.025 % Crea Generic drug: Histamine Dihydrochloride Apply 1 application. topically 4 (four) times daily as needed (pain).   CAL-MAG-ZINC PO Take 1 tablet by mouth daily.   cephALEXin 500 MG capsule Commonly known as: KEFLEX Take 500 mg by mouth 2 (two) times daily.   Co Q 10 100 MG Caps Take 100 mg by mouth daily.   cyclobenzaprine 10 MG tablet Commonly known as: FLEXERIL Take 10 mg by mouth at bedtime.   DHEA 25 MG Caps Take 25 mg by mouth daily.   EQ FIBER POWDER PO Take 1 Dose by mouth as needed.   EQL  Vitamin D3 25 MCG (1000 UT) capsule Generic drug: Cholecalciferol Take 1,000 Units by mouth daily.   ESTER C PO Take 1 tablet by mouth daily. 500 mg   ezetimibe 10 MG tablet Commonly known as: ZETIA Take 5 mg by mouth in the morning and at bedtime.   folic acid 035 MCG tablet Commonly known as: FOLVITE Take 400 mcg by mouth daily.   HYDROcodone-acetaminophen 5-325 MG tablet Commonly known as: NORCO/VICODIN Take 1 tablet by mouth at bedtime.   L-Carnitine 250 MG Tabs Take 1 tablet by mouth daily. With Chromium 250 mg   loratadine 10 MG tablet Commonly known as: CLARITIN Take 10 mg by mouth daily.   pantoprazole 40 MG tablet Commonly known as: PROTONIX Take 40 mg by mouth 2 (two) times daily.   polyethylene glycol powder 17 GM/SCOOP powder Commonly known as: GLYCOLAX/MIRALAX Take 17 g by mouth daily as needed for moderate constipation.   predniSONE 20 MG tablet Commonly known as: DELTASONE Take 2 tablets (40 mg total) by mouth daily with breakfast for 4 days.   pyridOXINE 100 MG tablet Commonly known as: VITAMIN B6 Take 100 mg by mouth daily.   Senna 8.6 MG Caps Take 1 capsule by mouth daily as needed (constipation).   Stiolto Respimat 2.5-2.5 MCG/ACT Aers Generic drug: Tiotropium Bromide-Olodaterol Inhale 2 puffs into the lungs daily.   sucralfate 1 GM/10ML suspension Commonly known as:  CARAFATE Take 1 g by mouth daily as needed.   Systane Hydration PF 0.4-0.3 % Soln Generic drug: Polyethyl Glyc-Propyl Glyc PF Place 1 drop into both eyes as needed. as   triamcinolone 55 MCG/ACT Aero nasal inhaler Commonly known as: NASACORT Place 2 sprays into the nose daily as needed.   Ubrelvy 100 MG Tabs Generic drug: Ubrogepant Take 100 mg by mouth daily as needed (migraines).   Vitamin A 2400 MCG (8000 UT) Caps Take 8,000 Units by mouth daily.   Vitamin B-12 2500 MCG Subl Place 2,500 mcg under the tongue daily.   Vitamin E 200 units Tabs Take 200 Units by  mouth daily.   Zofran 8 MG tablet Generic drug: ondansetron Take by mouth every 8 (eight) hours as needed for nausea or vomiting.        Vitals:   04/07/22 0540 04/07/22 0858  BP: (!) 103/49 (!) 105/52  Pulse: 97 96  Resp: 16 16  Temp: 98.4 F (36.9 C) 98.2 F (36.8 C)  SpO2: 91% 91%    Skin clean, dry and intact without evidence of skin break down and or skin tears. IV catheter discontinued intact. Site without signs and symptoms of complications. Dressing and pressure applied. Patient denies pain at this time. No complaints noted.  An After Visit Summary was printed and given to the patient. Patient escorted via wheelchair and discharged Home home via private auto.  Fuller Mandril, RN

## 2022-04-10 LAB — BODY FLUID CULTURE W GRAM STAIN: Culture: NO GROWTH

## 2022-04-26 ENCOUNTER — Encounter: Payer: Self-pay | Admitting: Pulmonary Disease

## 2022-04-26 ENCOUNTER — Other Ambulatory Visit: Payer: Self-pay | Admitting: Pulmonary Disease

## 2022-04-26 DIAGNOSIS — J90 Pleural effusion, not elsewhere classified: Secondary | ICD-10-CM

## 2022-04-27 ENCOUNTER — Ambulatory Visit
Admission: RE | Admit: 2022-04-27 | Discharge: 2022-04-27 | Disposition: A | Discharge status: Home / Self Care | Payer: Medicare Other | Source: Ambulatory Visit | Attending: Pulmonary Disease | Admitting: Pulmonary Disease

## 2022-04-27 ENCOUNTER — Other Ambulatory Visit: Payer: Self-pay | Admitting: Pulmonary Disease

## 2022-04-27 DIAGNOSIS — Z923 Personal history of irradiation: Secondary | ICD-10-CM | POA: Diagnosis not present

## 2022-04-27 DIAGNOSIS — J449 Chronic obstructive pulmonary disease, unspecified: Secondary | ICD-10-CM | POA: Insufficient documentation

## 2022-04-27 DIAGNOSIS — J90 Pleural effusion, not elsewhere classified: Secondary | ICD-10-CM | POA: Diagnosis present

## 2022-04-27 LAB — BODY FLUID CELL COUNT WITH DIFFERENTIAL
Eos, Fluid: 1 %
Lymphs, Fluid: 95 %
Monocyte-Macrophage-Serous Fluid: 3 %
Neutrophil Count, Fluid: 1 %
Total Nucleated Cell Count, Fluid: 2174 cu mm

## 2022-04-27 LAB — GLUCOSE, PLEURAL OR PERITONEAL FLUID: Glucose, Fluid: 95 mg/dL

## 2022-04-27 LAB — LACTATE DEHYDROGENASE, PLEURAL OR PERITONEAL FLUID: LD, Fluid: 120 U/L — ABNORMAL HIGH (ref 3–23)

## 2022-04-27 MED ORDER — LIDOCAINE HCL (PF) 1 % IJ SOLN
10.0000 mL | Freq: Once | INTRAMUSCULAR | Status: AC
  Administered 2022-04-27: 10 mL via INTRADERMAL
  Filled 2022-04-27: qty 10

## 2022-04-27 NOTE — Procedures (Signed)
PROCEDURE SUMMARY:  Successful US guided diagnostic and therapeutic right thoracentesis. Yielded 750 cc of clear, amber fluid. Pt tolerated procedure well. No immediate complications.  Specimen was sent for labs. CXR ordered.  EBL < 1 mL  Tyson Alias, AGNP 04/27/2022 10:16 AM

## 2022-04-29 LAB — CYTOLOGY - NON PAP

## 2022-05-07 LAB — MISC LABCORP TEST (SEND OUT): Labcorp test code: 9985

## 2022-05-13 ENCOUNTER — Ambulatory Visit
Admission: RE | Admit: 2022-05-13 | Discharge: 2022-05-13 | Disposition: A | Discharge status: Home / Self Care | Payer: Medicare Other | Source: Ambulatory Visit | Attending: Oncology | Admitting: Oncology

## 2022-05-13 ENCOUNTER — Inpatient Hospital Stay: Payer: Medicare Other | Attending: Oncology

## 2022-05-13 ENCOUNTER — Other Ambulatory Visit: Payer: Self-pay | Admitting: *Deleted

## 2022-05-13 DIAGNOSIS — C3412 Malignant neoplasm of upper lobe, left bronchus or lung: Secondary | ICD-10-CM

## 2022-05-13 DIAGNOSIS — Z452 Encounter for adjustment and management of vascular access device: Secondary | ICD-10-CM | POA: Diagnosis not present

## 2022-05-13 DIAGNOSIS — Z95828 Presence of other vascular implants and grafts: Secondary | ICD-10-CM

## 2022-05-13 LAB — COMPREHENSIVE METABOLIC PANEL
ALT: 12 U/L (ref 0–44)
AST: 19 U/L (ref 15–41)
Albumin: 3.9 g/dL (ref 3.5–5.0)
Alkaline Phosphatase: 86 U/L (ref 38–126)
Anion gap: 8 (ref 5–15)
BUN: 15 mg/dL (ref 8–23)
CO2: 27 mmol/L (ref 22–32)
Calcium: 8.9 mg/dL (ref 8.9–10.3)
Chloride: 103 mmol/L (ref 98–111)
Creatinine, Ser: 0.52 mg/dL (ref 0.44–1.00)
GFR, Estimated: >60 mL/min (ref >60)
Glucose, Bld: 103 mg/dL — ABNORMAL HIGH (ref 70–99)
Potassium: 4 mmol/L (ref 3.5–5.1)
Sodium: 138 mmol/L (ref 135–145)
Total Bilirubin: 0.4 mg/dL (ref 0.3–1.2)
Total Protein: 6.5 g/dL (ref 6.5–8.1)

## 2022-05-13 LAB — CBC WITH DIFFERENTIAL/PLATELET
Abs Immature Granulocytes: 0.01 10*3/uL (ref 0.00–0.07)
Basophils Absolute: 0 10*3/uL (ref 0.0–0.1)
Basophils Relative: 1 %
Eosinophils Absolute: 0.2 10*3/uL (ref 0.0–0.5)
Eosinophils Relative: 5 %
HCT: 37.6 % (ref 36.0–46.0)
Hemoglobin: 12.3 g/dL (ref 12.0–15.0)
Immature Granulocytes: 0 %
Lymphocytes Relative: 16 %
Lymphs Abs: 0.7 10*3/uL (ref 0.7–4.0)
MCH: 29.6 pg (ref 26.0–34.0)
MCHC: 32.7 g/dL (ref 30.0–36.0)
MCV: 90.4 fL (ref 80.0–100.0)
Monocytes Absolute: 0.4 10*3/uL (ref 0.1–1.0)
Monocytes Relative: 9 %
Neutro Abs: 2.9 10*3/uL (ref 1.7–7.7)
Neutrophils Relative %: 69 %
Platelets: 284 10*3/uL (ref 150–400)
RBC: 4.16 MIL/uL (ref 3.87–5.11)
RDW: 13.2 % (ref 11.5–15.5)
WBC: 4.2 10*3/uL (ref 4.0–10.5)
nRBC: 0 % (ref 0.0–0.2)

## 2022-05-13 MED ORDER — IOHEXOL 300 MG/ML  SOLN
100.0000 mL | Freq: Once | INTRAMUSCULAR | Status: AC | PRN
  Administered 2022-05-13: 100 mL via INTRAVENOUS

## 2022-05-13 MED ORDER — HEPARIN SOD (PORK) LOCK FLUSH 100 UNIT/ML IV SOLN
500.0000 [IU] | Freq: Once | INTRAVENOUS | Status: AC
  Administered 2022-05-13: 500 [IU] via INTRAVENOUS
  Filled 2022-05-13: qty 5

## 2022-05-13 MED ORDER — SODIUM CHLORIDE 0.9% FLUSH
10.0000 mL | Freq: Once | INTRAVENOUS | Status: AC
  Administered 2022-05-13: 10 mL via INTRAVENOUS
  Filled 2022-05-13: qty 10

## 2022-05-17 ENCOUNTER — Ambulatory Visit
Admission: RE | Admit: 2022-05-17 | Discharge: 2022-05-17 | Disposition: A | Discharge status: Home / Self Care | Payer: Medicare Other | Source: Ambulatory Visit | Attending: Internal Medicine | Admitting: Internal Medicine

## 2022-05-17 ENCOUNTER — Other Ambulatory Visit: Payer: Self-pay | Admitting: Internal Medicine

## 2022-05-17 ENCOUNTER — Ambulatory Visit
Admission: RE | Admit: 2022-05-17 | Discharge: 2022-05-17 | Disposition: A | Discharge status: Home / Self Care | Payer: Medicare Other | Source: Ambulatory Visit | Attending: Oncology | Admitting: Oncology

## 2022-05-17 ENCOUNTER — Encounter: Payer: Self-pay | Admitting: *Deleted

## 2022-05-17 VITALS — BP 119/64 | HR 115 | Resp 19

## 2022-05-17 DIAGNOSIS — J90 Pleural effusion, not elsewhere classified: Secondary | ICD-10-CM

## 2022-05-17 DIAGNOSIS — J449 Chronic obstructive pulmonary disease, unspecified: Secondary | ICD-10-CM | POA: Diagnosis not present

## 2022-05-17 DIAGNOSIS — Z923 Personal history of irradiation: Secondary | ICD-10-CM | POA: Insufficient documentation

## 2022-05-17 DIAGNOSIS — Z9889 Other specified postprocedural states: Secondary | ICD-10-CM

## 2022-05-17 DIAGNOSIS — C3490 Malignant neoplasm of unspecified part of unspecified bronchus or lung: Secondary | ICD-10-CM

## 2022-05-17 DIAGNOSIS — C3412 Malignant neoplasm of upper lobe, left bronchus or lung: Secondary | ICD-10-CM

## 2022-05-17 LAB — PROTEIN, PLEURAL OR PERITONEAL FLUID: Total protein, fluid: 4.4 g/dL

## 2022-05-17 NOTE — Procedures (Addendum)
PROCEDURE SUMMARY:  Request for left thoracentesis.  Upon US examination, there was no fluid on L and large effusion on right. Successful US guided diagnostic and therapeutic right thoracentesis. Yielded 800 cc of clear, amber fluid. Pt tolerated procedure well. No immediate complications.  Specimen was sent for labs. CXR ordered.  EBL < 1 mL  Tyson Alias, AGNP 05/17/2022 1:21 PM

## 2022-05-17 NOTE — Progress Notes (Signed)
Per Dr. Janese Banks, pt needs to be scheduled for thoracentesis for left pleural effusion and check cytology on pleural fluid. If fluid is negative then will need to obtain PET scan to further characterize pleural thickening seen on recent CT scan to determine if malignancy is involved. Dr. Janese Banks will follow up with pt on 3/19 to review results of thoracentesis and PET scan if needed. Orders placed and will notify pt with appts once scheduled.

## 2022-05-18 LAB — CYTOLOGY - NON PAP

## 2022-05-18 NOTE — Addendum Note (Signed)
Addended by: Telford Nab on: 05/18/2022 02:42 PM   Modules accepted: Orders

## 2022-05-21 LAB — BODY FLUID CULTURE W GRAM STAIN: Culture: NO GROWTH

## 2022-05-30 ENCOUNTER — Ambulatory Visit
Admission: RE | Admit: 2022-05-30 | Discharge: 2022-05-30 | Disposition: A | Discharge status: Home / Self Care | Payer: Medicare Other | Source: Ambulatory Visit | Attending: Oncology | Admitting: Oncology

## 2022-05-30 DIAGNOSIS — Z923 Personal history of irradiation: Secondary | ICD-10-CM | POA: Diagnosis not present

## 2022-05-30 DIAGNOSIS — C3490 Malignant neoplasm of unspecified part of unspecified bronchus or lung: Secondary | ICD-10-CM

## 2022-05-30 DIAGNOSIS — J90 Pleural effusion, not elsewhere classified: Secondary | ICD-10-CM | POA: Insufficient documentation

## 2022-05-30 DIAGNOSIS — I700 Atherosclerosis of aorta: Secondary | ICD-10-CM | POA: Insufficient documentation

## 2022-05-30 DIAGNOSIS — Z85820 Personal history of malignant melanoma of skin: Secondary | ICD-10-CM | POA: Insufficient documentation

## 2022-05-30 DIAGNOSIS — J929 Pleural plaque without asbestos: Secondary | ICD-10-CM | POA: Diagnosis not present

## 2022-05-30 DIAGNOSIS — I6523 Occlusion and stenosis of bilateral carotid arteries: Secondary | ICD-10-CM | POA: Diagnosis not present

## 2022-05-30 DIAGNOSIS — R918 Other nonspecific abnormal finding of lung field: Secondary | ICD-10-CM | POA: Diagnosis not present

## 2022-05-30 LAB — GLUCOSE, CAPILLARY: Glucose-Capillary: 88 mg/dL (ref 70–99)

## 2022-05-30 MED ORDER — FLUDEOXYGLUCOSE F - 18 (FDG) INJECTION
8.8000 | Freq: Once | INTRAVENOUS | Status: AC | PRN
  Administered 2022-05-30: 9.63 via INTRAVENOUS

## 2022-05-31 ENCOUNTER — Encounter: Payer: Self-pay | Admitting: Oncology

## 2022-05-31 ENCOUNTER — Inpatient Hospital Stay (HOSPITAL_BASED_OUTPATIENT_CLINIC_OR_DEPARTMENT_OTHER): Payer: Medicare Other | Admitting: Oncology

## 2022-05-31 VITALS — BP 126/73 | HR 106 | Temp 97.0°F | Wt 165.8 lb

## 2022-05-31 DIAGNOSIS — Z7189 Other specified counseling: Secondary | ICD-10-CM | POA: Diagnosis not present

## 2022-05-31 DIAGNOSIS — C3412 Malignant neoplasm of upper lobe, left bronchus or lung: Secondary | ICD-10-CM | POA: Diagnosis not present

## 2022-05-31 MED ORDER — ONDANSETRON HCL 8 MG PO TABS: 8.0000 mg | ORAL_TABLET | Freq: Three times a day (TID) | ORAL | 0 refills | Status: DC | PRN

## 2022-05-31 NOTE — Progress Notes (Addendum)
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  EGFR and Ros as well as MET testing negative. PD-L1 was 50%   Patient completed concurrent chemoradiation with weekly carbotaxol chemotherapy on 03/12/2019.  Maintenance durvalumab started in January 2021 after scan showed partial response.  Patient completed 1 year of maintenance durvalumab in February 2022   Patient was found to have enlarging mediastinal adenopathyOn subsequent CT scan in January 2023.  This was followed by a PET scan which showed hypermetabolic mediastinal and right hilar lymph nodes but no other evidence of disease elsewhere.  This was biopsied and was consistent with non-small cell lung cancer specifically adenocarcinoma.  Cells were positive for TTF-1 and Napsin A and negative for p40.   Patient completed concurrent chemoradiation with 4 cycles of carbo Alimta ending in May 2023.  Scan showed partial response.  Plan is for maintenance Alimta until progression or toxicity.  Alimta was stopped in November 2023 due to progressive anemia and stable scans   Patient was hospitalized in November 2023  for acute on chronic hypoxic respiratory failure secondary to healthcare associated pneumonia.  She had pleural effusions which were tapped and thoracentesis was negative for malignancy.  Interval history- No recent hospitalizations since January 2024.  She does have chronic shortness of breath and does use oxygen as needed at night.  ECOG PS- 2 Pain scale- 0 Opioid associated constipation- no  Review of systems- Review of Systems  Constitutional:  Positive for malaise/fatigue. Negative for chills, fever and weight loss.  HENT:  Negative for congestion, ear discharge and nosebleeds.   Eyes:  Negative for blurred vision.  Respiratory:  Positive for shortness of breath. Negative for cough, hemoptysis, sputum production and wheezing.   Cardiovascular:  Negative for chest pain, palpitations, orthopnea and claudication.  Gastrointestinal:  Negative for abdominal pain, blood in stool, constipation, diarrhea, heartburn, melena, nausea and vomiting.  Genitourinary:  Negative for dysuria, flank pain, frequency, hematuria and urgency.  Musculoskeletal:  Negative for back pain, joint pain and myalgias.  Skin:  Negative for rash.  Neurological:  Negative for dizziness, tingling, focal weakness, seizures, weakness and headaches.  Endo/Heme/Allergies:  Does not bruise/bleed easily.  Psychiatric/Behavioral:  Negative for depression and suicidal ideas. The patient does not have insomnia.       Allergies  Allergen Reactions   Aspirin Hives, Shortness Of Breath and Other (See Comments)    Difficulty breathing   Celebrex [Celecoxib] Shortness Of Breath   Morphine And Related Nausea And Vomiting and Swelling   Adhesive [Tape] Other (See Comments)    Took top layer of skin off when removed.   Clarithromycin Nausea And Vomiting   Codeine Nausea And Vomiting   Darvon [Propoxyphene Hcl] Nausea And Vomiting  Demerol [Meperidine] Nausea And Vomiting   Flonase [Fluticasone Propionate] Other (See Comments)     Fungal infection in nose   Simvastatin Other (See Comments)    "caused ulcers in mouth, and fever"   Talwin [Pentazocine] Nausea And Vomiting     Past Medical History:  Diagnosis Date   Anemia    Asthma    No Inhalers--Dr. [name] will order as needed   Bronchiectasis ([place])    mild   Chronic headaches     followed by [facility] migraines   COPD (chronic obstructive pulmonary disease) ([place])    DDD (degenerative disc disease), lumbar    Diverticulosis    Family history of adverse reaction to anesthesia    mother and sisters-PONV   Gall stones    history of   GERD (gastroesophageal reflux disease)    EGD 8/09- non bleeding erosive gastritis, documentd esophageal ulcerations.    Hiatal hernia    small   History of kidney stones    History of pneumonia    Hypercholesterolemia    IBS (irritable bowel syndrome)    Malignant neoplasm of upper lobe of left lung ([place]) 12/27/2018   Meniere disease    Murmur    Osteoarthritis    lumbar disc disease, left hip   Personal history of chemotherapy    Personal history of radiation therapy    Pneumonia 12/2020   PONV (postoperative nausea and vomiting)    Sleep apnea    uses cpap   Vertigo    Weakness of right side of body      Past Surgical History:  Procedure Laterality Date   ABDOMINAL HYSTERECTOMY  age 36   ANTERIOR CERVICAL DECOMP/DISCECTOMY FUSION N/A 02/24/2015   Procedure: CERVICAL FOUR-FIVE, CERVICAL FIVE-SIX, CERVICAL SIX-SEVEN ANTERIOR CERVICAL DECOMPRESSION/DISCECTOMY FUSION ;  Surgeon: [name], MD;  Location: [facility];  Service: Neurosurgery;  Laterality: N/A;  C45 C56 C67 anterior cervical decompression with fusion interbody prosthesis plating and bonegraft   APPENDECTOMY     BACK SURGERY  1988   4th lumbar fusion   BLADDER SURGERY N/A    with vaginal wall repair   BREAST CYST ASPIRATION Bilateral    neg   BREAST SURGERY Bilateral    cyst removed and reduction   CARDIAC CATHETERIZATION   2014   CARPAL TUNNEL RELEASE Right 02/11/2016   Procedure: CARPAL TUNNEL RELEASE;  Surgeon: [name], MD;  Location: [facility];  Service: Orthopedics;  Laterality: Right;   CATARACT EXTRACTION W/ INTRAOCULAR LENS IMPLANT Bilateral 2015   CHOLECYSTECTOMY     EUS N/A 05/31/2012   Procedure: UPPER ENDOSCOPIC ULTRASOUND (EUS) LINEAR;  Surgeon: [name], MD;  Location: [facility];  Service: Endoscopy;  Laterality: N/A;   EXCISIONAL HEMORRHOIDECTOMY     JOINT REPLACEMENT Bilateral    KNEE ARTHROSCOPY WITH LATERAL MENISECTOMY Right 07/07/2015   Procedure: KNEE ARTHROSCOPY WITH LATERAL MENISECTOMY, PARTIAL SYNOVECTOMY;  Surgeon: [name], MD;  Location: [facility];  Service: Orthopedics;  Laterality: Right;   LUMBAR LAMINECTOMY     PORTA CATH INSERTION N/A 01/07/2019   Procedure: PORTA CATH INSERTION;  Surgeon: [name], MD;  Location: [place] [facility];  Service: Cardiovascular;  Laterality: N/A;   REDUCTION MAMMAPLASTY  1990   RIGHT OOPHORECTOMY     TOTAL HIP ARTHROPLASTY Left 05/01/2014   Dr. [name]   TOTAL HIP ARTHROPLASTY Right 08/05/2014   Procedure: TOTAL HIP ARTHROPLASTY ANTERIOR APPROACH;  Surgeon: [name], MD;  Location: [facility];  Service:  Orthopedics;  Laterality: Right;   ULNAR NERVE TRANSPOSITION Right 02/11/2016   Procedure: ULNAR NERVE DECOMPRESSION/TRANSPOSITION;  Surgeon: [name], MD;  Location: [facility];  Service: Orthopedics;  Laterality: Right;   VIDEO BRONCHOSCOPY WITH ENDOBRONCHIAL ULTRASOUND Left 12/19/2018   Procedure: VIDEO BRONCHOSCOPY WITH ENDOBRONCHIAL ULTRASOUND, LEFT, SLEEP APNEA;  Surgeon: [name], MD;  Location: [facility];  Service: Thoracic;  Laterality: Left;   VIDEO BRONCHOSCOPY WITH ENDOBRONCHIAL ULTRASOUND Right 05/14/2021   Procedure: VIDEO BRONCHOSCOPY WITH ENDOBRONCHIAL ULTRASOUND;  Surgeon: Tyler Pita, MD;  Location: [facility];  Service: Pulmonary;  Laterality: Right;    Social History   Socioeconomic  History   Marital status: Married    Spouse name: Not on file   Number of children: Not on file   Years of education: Not on file   Highest education level: Not on file  Occupational History   Not on file  Tobacco Use   Smoking status: Former    Packs/day: 1.00    Years: 40.00    Additional pack years: 0.00    Total pack years: 40.00    Types: Cigarettes    Quit date: 06/13/2007    Years since quitting: 14.9   Smokeless tobacco: Never  Vaping Use   Vaping Use: Never used  Substance and Sexual Activity   Alcohol use: No    Alcohol/week: 0.0 standard drinks of alcohol   Drug use: No   Sexual activity: Not on file  Other Topics Concern   Not on file  Social History Narrative   Not on file   Social Determinants of Health   Financial Resource Strain: Not on file  Food Insecurity: No Food Insecurity (04/06/2022)   Hunger Vital Sign    Worried About Running Out of Food in the Last Year: Never true    Ran Out of Food in the Last Year: Never true  Transportation Needs: No Transportation Needs (04/06/2022)   PRAPARE - Hydrologist (Medical): No    Lack of Transportation (Non-Medical): No  Physical Activity: Not on file  Stress: Not on file  Social Connections: Not on file  Intimate Partner Violence: Not At Risk (04/06/2022)   Humiliation, Afraid, Rape, and Kick questionnaire    Fear of Current or Ex-Partner: No    Emotionally Abused: No    Physically Abused: No    Sexually Abused: No    Family History  Problem Relation Age of Onset   Heart disease Mother        s/p stent   Hypertension Mother    Hypercholesterolemia Mother    Diabetes Father    Stomach cancer Other        uncle   Breast cancer Cousin    Breast cancer Paternal Aunt      Current Outpatient Medications:    acetaminophen (TYLENOL) 500 MG tablet, Take 500 mg by mouth 2 (two) times daily., Disp: , Rfl:    cyclobenzaprine (FLEXERIL) 10 MG tablet, Take 10 mg by mouth at  bedtime., Disp: , Rfl:    ezetimibe (ZETIA) 10 MG tablet, Take 5 mg by mouth in the morning and at bedtime., Disp: , Rfl:    Histamine Dihydrochloride (AUSTRALIAN DREAM ARTHRITIS) 0.025 % CREA, Apply 1 application. topically 4 (four) times daily as needed (pain)., Disp: , Rfl:    HYDROcodone-acetaminophen (NORCO/VICODIN) 5-325 MG tablet, Take 1 tablet by mouth at bedtime., Disp: 30 tablet, Rfl: 0   pantoprazole (PROTONIX) 40 MG tablet, Take 40 mg by  mouth 2 (two) times daily., Disp: , Rfl:    Polyethyl Glyc-Propyl Glyc PF (SYSTANE HYDRATION PF) 0.4-0.3 % SOLN, Place 1 drop into both eyes as needed. as, Disp: , Rfl:    polyethylene glycol powder (GLYCOLAX/MIRALAX) 17 GM/SCOOP powder, Take 17 g by mouth daily as needed for moderate constipation., Disp: , Rfl:    pyridOXINE (VITAMIN B-6) 100 MG tablet, Take 100 mg by mouth daily., Disp: , Rfl:    spironolactone (ALDACTONE) 25 MG tablet, Take 25 mg by mouth., Disp: , Rfl:    torsemide (DEMADEX) 20 MG tablet, Take 20 mg by mouth once., Disp: , Rfl:    triamcinolone (NASACORT) 55 MCG/ACT AERO nasal inhaler, Place 2 sprays into the nose daily as needed., Disp: , Rfl:    UBRELVY 100 MG TABS, Take 100 mg by mouth daily as needed (migraines)., Disp: , Rfl:    Vitamin E 200 units TABS, Take 200 Units by mouth daily., Disp: , Rfl:    albuterol (VENTOLIN HFA) 108 (90 Base) MCG/ACT inhaler, Inhale 2 puffs into the lungs every 6 (six) hours as needed for wheezing or shortness of breath. (Patient not taking: Reported on 05/31/2022), Disp: , Rfl:    Bioflavonoid Products (ESTER C PO), Take 1 tablet by mouth daily. 500 mg (Patient not taking: Reported on 05/31/2022), Disp: , Rfl:    Calcium-Magnesium-Zinc (CAL-MAG-ZINC PO), Take 1 tablet by mouth daily. (Patient not taking: Reported on 05/31/2022), Disp: , Rfl:    cephALEXin (KEFLEX) 500 MG capsule, Take 500 mg by mouth 2 (two) times daily. (Patient not taking: Reported on 05/31/2022), Disp: , Rfl:    Cholecalciferol  (EQL VITAMIN D3) 25 MCG (1000 UT) capsule, Take 1,000 Units by mouth daily. (Patient not taking: Reported on 05/31/2022), Disp: , Rfl:    Coenzyme Q10 (CO Q 10) 100 MG CAPS, Take 100 mg by mouth daily.  (Patient not taking: Reported on 05/31/2022), Disp: , Rfl:    Cyanocobalamin (VITAMIN B-12) 2500 MCG SUBL, Place 2,500 mcg under the tongue daily. (Patient not taking: Reported on 05/31/2022), Disp: , Rfl:    DHEA 25 MG CAPS, Take 25 mg by mouth daily. (Patient not taking: Reported on 05/31/2022), Disp: , Rfl:    folic acid (FOLVITE) A999333 MCG tablet, Take 400 mcg by mouth daily. (Patient not taking: Reported on 05/31/2022), Disp: , Rfl:    levOCARNitine (L-CARNITINE) 250 MG TABS, Take 1 tablet by mouth daily. With Chromium 250 mg (Patient not taking: Reported on 05/31/2022), Disp: , Rfl:    loratadine (CLARITIN) 10 MG tablet, Take 10 mg by mouth daily. (Patient not taking: Reported on 05/31/2022), Disp: , Rfl:    ondansetron (ZOFRAN) 8 MG tablet, Take 1 tablet (8 mg total) by mouth every 8 (eight) hours as needed for nausea or vomiting., Disp: 20 tablet, Rfl: 0   Sennosides (SENNA) 8.6 MG CAPS, Take 1 capsule by mouth daily as needed (constipation). (Patient not taking: Reported on 05/31/2022), Disp: , Rfl:    sucralfate (CARAFATE) 1 GM/10ML suspension, Take 1 g by mouth daily as needed. (Patient not taking: Reported on 05/31/2022), Disp: , Rfl:    Tiotropium Bromide-Olodaterol (STIOLTO RESPIMAT) 2.5-2.5 MCG/ACT AERS, Inhale 2 puffs into the lungs daily. (Patient not taking: Reported on 05/31/2022), Disp: 4 g, Rfl: 10   Vitamin A 2400 MCG (8000 UT) CAPS, Take 8,000 Units by mouth daily. (Patient not taking: Reported on 05/31/2022), Disp: , Rfl:    Wheat Dextrin (EQ FIBER POWDER PO), Take 1 Dose by mouth as  needed. (Patient not taking: Reported on 05/31/2022), Disp: , Rfl:   Physical exam:  Vitals:   05/31/22 1007  BP: 126/73  Pulse: (!) 106  Temp: (!) 97 F (36.1 C)  TempSrc: Tympanic  SpO2: 97%  Weight:  165 lb 12.8 oz (75.2 kg)   Physical Exam Cardiovascular:     Rate and Rhythm: Normal rate and regular rhythm.     Heart sounds: Normal heart sounds.  Pulmonary:     Effort: Pulmonary effort is normal.     Comments: Breath sounds decreased over right lung base Abdominal:     General: Bowel sounds are normal.     Palpations: Abdomen is soft.  Skin:    General: Skin is warm and dry.  Neurological:     Mental Status: She is alert and oriented to person, place, and time.         Latest Ref Rng & Units 05/13/2022    9:26 AM  CMP  Glucose 70 - 99 mg/dL 103   BUN 8 - 23 mg/dL 15   Creatinine 0.44 - 1.00 mg/dL 0.52   Sodium 135 - 145 mmol/L 138   Potassium 3.5 - 5.1 mmol/L 4.0   Chloride 98 - 111 mmol/L 103   CO2 22 - 32 mmol/L 27   Calcium 8.9 - 10.3 mg/dL 8.9   Total Protein 6.5 - 8.1 g/dL 6.5   Total Bilirubin 0.3 - 1.2 mg/dL 0.4   Alkaline Phos 38 - 126 U/L 86   AST 15 - 41 U/L 19   ALT 0 - 44 U/L 12       Latest Ref Rng & Units 05/13/2022    9:26 AM  CBC  WBC 4.0 - 10.5 K/uL 4.2   Hemoglobin 12.0 - 15.0 g/dL 12.3   Hematocrit 36.0 - 46.0 % 37.6   Platelets 150 - 400 K/uL 284     No images are attached to the encounter.  NM PET Image Restag (PS) Skull Base To Thigh  Result Date: 05/31/2022 CLINICAL DATA:  Subsequent treatment strategy for non-small cell lung cancer. Increasing pleural effusions and pleural thickening on CT. Thoracentesis performed 05/17/2022. EXAM: NUCLEAR MEDICINE PET SKULL BASE TO THIGH TECHNIQUE: 9.63 mCi F-18 FDG was injected intravenously. Full-ring PET imaging was performed from the skull base to thigh after the radiotracer. CT data was obtained and used for attenuation correction and anatomic localization. Fasting blood glucose: 88 mg/dl COMPARISON:  CT of the chest, abdomen and pelvis 05/13/2022 and 02/15/2022. PET-CT 04/12/2021. FINDINGS: Mediastinal blood pool activity: SUV max 2.2 NECK: No hypermetabolic cervical lymph nodes are identified. No  suspicious activity identified within the pharyngeal mucosal space. Incidental CT findings: Bilateral carotid atherosclerosis. CHEST: There are recurrent hypermetabolic mediastinal and bilateral hilar lymph nodes. Right paratracheal nodes measuring 6 mm short axis on image 42/4 have an SUV max 11.5. There is a small hypermetabolic precarinal node with an SUV max of 5.6. There are small mildly hypermetabolic hilar lymph nodes bilaterally (SUV max 3.9 on the left). No axillary adenopathy. Prominent hypermetabolic activity along the right atrial appendage is also noted (SUV max 6.2). This activity is nonspecific and potentially physiologic, although has increased from the previous PET-CT could reflect pericardial or pleural disease. No other significant hypermetabolic activity within the pleural space. There are 2 hypermetabolic right lung nodules. Approximately 9 mm right upper lobe nodule on image 52/4 has an SUV max of 6.4. Medial right lower lobe nodule measuring 9 mm on image 60/4 has an SUV  max of 8.0. No hypermetabolic pulmonary activity or suspicious nodularity in the left lung. Incidental CT findings: Right IJ Port-A-Cath extends to the lower SVC. Atherosclerosis of the aorta, great vessels and coronary arteries. A small pericardial effusion and a moderate size dependent right pleural effusion appear unchanged from recent CT. Grossly stable perihilar fibrotic changes in both lungs attributed to prior radiation therapy. ABDOMEN/PELVIS: There is no hypermetabolic activity within the liver, adrenal glands, spleen or pancreas. There is no hypermetabolic nodal activity in the abdomen or pelvis. Incidental CT findings: Stable mild extrahepatic biliary dilatation status post cholecystectomy. Punctate nonobstructing calculus in the lower pole of the right kidney. Aortic and branch vessel atherosclerosis without evidence of aneurysm. Previous hysterectomy. SKELETON: There is no hypermetabolic activity to suggest  osseous metastatic disease. Incidental CT findings: Previous bilateral total hip arthroplasty. IMPRESSION: 1. Recurrent hypermetabolic mediastinal and bilateral hilar lymph nodes consistent with metastatic disease. 2. Hypermetabolic right lung nodules consistent with metastatic disease. 3. Nonspecific hypermetabolic activity along the right atrial appendage, potentially physiologic, although increased from the previous PET-CT. This could reflect pericardial or pleural disease. No other abnormal pericardial or pleural metabolic activity. A moderate size right pleural effusion small pericardial effusion are unchanged in volume from recent diagnostic CTs. 4. No evidence of metastatic disease in the abdomen, pelvis or bones. 5. Stable perihilar fibrotic changes in both lungs attributed to prior radiation therapy. 6.  Aortic Atherosclerosis (ICD10-I70.0). Electronically Signed   By: Richardean Sale M.D.   On: 05/31/2022 14:06   US THORACENTESIS ASP PLEURAL SPACE W/IMG GUIDE  Result Date: 05/17/2022 INDICATION: History of COPD, stage III adenocarcinoma of lung with vocal cord paralysis status post radiation, currently on chemotherapy with recurrent right pleural effusion. Request received for left diagnostic and therapeutic thoracentesis. Upon ultrasound exam, there was no fluid on left and large right pleural effusion with patient complaint of dyspnea. EXAM: ULTRASOUND GUIDED DIAGNOSTIC AND THERAPEUTIC RIGHT THORACENTESIS MEDICATIONS: 10 mL 1 % lidocaine COMPLICATIONS: None immediate. PROCEDURE: An ultrasound guided thoracentesis was thoroughly discussed with the patient and questions answered. The benefits, risks, alternatives and complications were also discussed. The patient understands and wishes to proceed with the procedure. Written consent was obtained. Ultrasound was performed to localize and mark an adequate pocket of fluid in the right chest. The area was then prepped and draped in the normal sterile  fashion. 1% Lidocaine was used for local anesthesia. Under ultrasound guidance a 6 Fr Safe-T-Centesis catheter was introduced. Thoracentesis was performed. The catheter was removed and a dressing applied. FINDINGS: A total of approximately 800 cc of clear, amber fluid was removed. Samples were sent to the laboratory as requested by the clinical team. IMPRESSION: Successful ultrasound guided right thoracentesis yielding 800 cc of pleural fluid. Read by: Narda Rutherford, AGNP-BC Electronically Signed   By: Albin Felling M.D.   On: 05/17/2022 14:34   DG Chest Port 1 View  Result Date: 05/17/2022 CLINICAL DATA:  Pleural effusion EXAM: PORTABLE CHEST 1 VIEW COMPARISON:  Chest x-ray dated April 27, 2022; CT chest, abdomen and pelvis dated May 13, 2022 FINDINGS: Cardiac and mediastinal contours are unchanged. Stable position of right chest wall port. Unchanged right-greater-than-left perihilar linear consolidations, likely posttreatment change. Elevation of the right hemidiaphragm. Small right pleural effusion, new when compared with prior chest x-ray present on recent CT. No evidence of pneumothorax. IMPRESSION: 1. No evidence of pneumothorax. 2. Small right pleural effusion. Electronically Signed   By: Yetta Glassman M.D.   On: 05/17/2022 13:35  CT CHEST ABDOMEN PELVIS W CONTRAST  Result Date: 05/15/2022 CLINICAL DATA:  Lung cancer restaging * Tracking Code: BO * EXAM: CT CHEST, ABDOMEN, AND PELVIS WITH CONTRAST TECHNIQUE: Multidetector CT imaging of the chest, abdomen and pelvis was performed following the standard protocol during bolus administration of intravenous contrast. RADIATION DOSE REDUCTION: This exam was performed according to the departmental dose-optimization program which includes automated exposure control, adjustment of the mA and/or kV according to patient size and/or use of iterative reconstruction technique. CONTRAST:  121mL OMNIPAQUE IOHEXOL 300 MG/ML SOLN oral enteric contrast COMPARISON:   CT chest abdomen pelvis, 02/15/2022 FINDINGS: CT CHEST FINDINGS Cardiovascular: Right chest port catheter. Aortic atherosclerosis. Normal heart size. Scattered three-vessel coronary artery calcifications. Moderate pericardial effusion, increased compared to prior examination. Mediastinum/Nodes: No enlarged mediastinal, hilar, or axillary lymph nodes. Thyroid gland, trachea, and esophagus demonstrate no significant findings. Lungs/Pleura: Moderate right, small left loculated pleural effusions, increased compared to prior examination with some associated pleural thickening. Otherwise similar post treatment appearance of the chest, with bilateral perihilar consolidation, fibrosis, and bronchiectasis. Moderate centrilobular emphysema. Musculoskeletal: No chest wall abnormality. No acute osseous findings. CT ABDOMEN PELVIS FINDINGS Hepatobiliary: No focal liver abnormality is seen. Status post cholecystectomy. Mild postoperative biliary ductal dilatation. Pancreas: Unremarkable. No pancreatic ductal dilatation or surrounding inflammatory changes. Spleen: Normal in size without significant abnormality. Adrenals/Urinary Tract: Adrenal glands are unremarkable. Kidneys are normal, without renal calculi, solid lesion, or hydronephrosis. Evaluation of the low pelvis is significantly limited by dense metallic streak artifact from bilateral hip arthroplasty. Within this limitation, no obvious abnormality. Stomach/Bowel: Stomach is within normal limits. Appendix appears normal. No evidence of bowel wall thickening, distention, or inflammatory changes. Large burden of stool throughout the colon. Sigmoid diverticula. Vascular/Lymphatic: Aortic atherosclerosis. No enlarged abdominal or pelvic lymph nodes. Reproductive: Evaluation of the low pelvis is significantly limited by dense metallic streak artifact from bilateral hip arthroplasty. Within this limitation, no obvious abnormality. Other: No abdominal wall hernia or abnormality.  No ascites. Musculoskeletal: No acute osseous findings. Status post bilateral hip total arthroplasty. IMPRESSION: 1. Moderate right, small left loculated pleural effusions, increased compared to prior examination with some associated pleural thickening, presumably malignant. 2. Otherwise similar post treatment appearance of the chest, with bilateral post treatment/post radiation perihilar consolidation, fibrosis, and bronchiectasis. 3. No evidence of lymphadenopathy or metastatic disease in the chest, abdomen, or pelvis. 4. Moderate pericardial effusion, increased compared to prior examination. 5. Large burden of stool throughout the colon. 6. Sigmoid diverticulosis without evidence of acute diverticulitis. 7. Coronary artery disease. 8. Emphysema. Aortic Atherosclerosis (ICD10-I70.0) and Emphysema (ICD10-J43.9). Electronically Signed   By: Delanna Ahmadi M.D.   On: 05/15/2022 09:32     Assessment and plan- Patient is a 77 y.o. 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  However PET scan had also shown hypermetabolism in the right lower lobe which was concerning to me.  After the patient left the clinic PET CT scan was reported to show hypermetabolism in mediastinum and bilateral hilar lymph nodes which are subcentimeter but appear hypermetabolic along with hypermetabolism involving the right atrial appendage and hypermetabolic lung nodules consistent with metastatic disease.  I will discuss her case at tumor board in 2  days to see if we can get repeat biopsy at this time as well as send tissue for NGS testing.  She did not have any actionable mutations based on NGS testing back in October  2020.  Follow-up with me to be decided based on consensus at tumor board and need for repeat biopsy.  Patient also was found to have moderate pericardial effusion on her CT scan in March 2024.  She has a follow-up with cardiology later this month.  Pleural effusions have been tapped and so far have been negative for malignancy.  They did show lymphocytic predominance and I will consider sending flow cytometry as well to rule out lymphoma however that seems to be less likely   Visit Diagnosis 1. Goals of care, counseling/discussion   2. Malignant neoplasm of upper lobe of left lung ([place])      Dr. Randa Evens, MD, MPH [facility] at [facility] [id number] 05/31/2022 3:53 PM

## 2022-06-02 ENCOUNTER — Other Ambulatory Visit: Payer: Medicare Other

## 2022-06-07 ENCOUNTER — Inpatient Hospital Stay (HOSPITAL_BASED_OUTPATIENT_CLINIC_OR_DEPARTMENT_OTHER): Payer: Medicare Other | Admitting: Oncology

## 2022-06-07 ENCOUNTER — Encounter: Payer: Self-pay | Admitting: Oncology

## 2022-06-07 VITALS — BP 122/69 | HR 110 | Temp 98.1°F | Resp 18 | Ht 61.0 in | Wt 164.3 lb

## 2022-06-07 DIAGNOSIS — Z7189 Other specified counseling: Secondary | ICD-10-CM

## 2022-06-07 DIAGNOSIS — C3412 Malignant neoplasm of upper lobe, left bronchus or lung: Secondary | ICD-10-CM | POA: Diagnosis not present

## 2022-06-07 NOTE — Progress Notes (Signed)
Patient states that she has had loose stools off and on and hot water coming up in her mouth.

## 2022-06-12 ENCOUNTER — Encounter: Payer: Self-pay | Admitting: Oncology

## 2022-06-12 NOTE — Progress Notes (Signed)
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  EGFR and Ros as well as MET testing negative. PD-L1 was 50%   Patient completed concurrent chemoradiation with weekly carbotaxol chemotherapy on 03/12/2019.  Maintenance durvalumab started in January 2021 after scan showed partial response.  Patient completed 1 year of maintenance durvalumab in February 2022   Patient was found to have enlarging mediastinal adenopathyOn subsequent CT scan in January 2023.  This was followed by a PET scan which showed hypermetabolic mediastinal and right hilar lymph nodes but no other evidence of disease elsewhere.  This was biopsied and was consistent with non-small cell lung cancer specifically adenocarcinoma.  Cells were positive for TTF-1 and Napsin A and negative for p40.   Patient completed concurrent chemoradiation with 4 cycles of carbo Alimta ending in May 2023.  Scan showed partial response.  Plan is for maintenance Alimta until progression or toxicity.  Alimta was stopped in November 2023 due to progressive anemia and stable scans   Patient was hospitalized in November 2023 for acute on  chronic hypoxic respiratory failure secondary to healthcare associated pneumonia.  She had pleural effusions which were tapped and thoracentesis was negative for malignancy.  Interval history-patient is at her baseline state of health and requires on and off oxygen especially at night.  No hospitalizations since January 2024.  ECOG PS- 2 Pain scale- 0   Review of systems- Review of Systems  Constitutional:  Positive for malaise/fatigue. Negative for chills, fever and weight loss.  HENT:  Negative for congestion, ear discharge and nosebleeds.   Eyes:  Negative for blurred vision.  Respiratory:  Positive for cough and shortness of breath. Negative for hemoptysis, sputum production and wheezing.   Cardiovascular:  Negative for chest pain, palpitations, orthopnea and claudication.  Gastrointestinal:  Negative for abdominal pain, blood in stool, constipation, diarrhea, heartburn, melena, nausea and vomiting.  Genitourinary:  Negative for dysuria, flank pain, frequency, hematuria and urgency.  Musculoskeletal:  Negative for back pain, joint pain and myalgias.  Skin:  Negative for rash.  Neurological:  Negative for dizziness, tingling, focal weakness, seizures, weakness and headaches.  Endo/Heme/Allergies:  Does not bruise/bleed easily.  Psychiatric/Behavioral:  Negative for depression and suicidal ideas. The patient does not have insomnia.       Allergies  Allergen Reactions   Aspirin Hives, Shortness Of Breath and Other (See Comments)    Difficulty breathing   Celebrex [Celecoxib] Shortness Of Breath   Morphine And Related Nausea And Vomiting and Swelling   Adhesive [Tape] Other (See Comments)    Took top layer of skin off when removed.   Clarithromycin Nausea And Vomiting   Codeine Nausea And Vomiting   Darvon [Propoxyphene Hcl] Nausea And Vomiting   Demerol [Meperidine]  Nausea And Vomiting   Flonase [Fluticasone Propionate] Other (See Comments)    Fungal infection in nose    Simvastatin Other (See Comments)    "caused ulcers in mouth, and fever"   Talwin [Pentazocine] Nausea And Vomiting     Past Medical History:  Diagnosis Date   Anemia    Asthma    No Inhalers--Dr. [name] will order as needed   Bronchiectasis ([place])    mild   Chronic headaches     followed by [facility] migraines   COPD (chronic obstructive pulmonary disease) ([place])    DDD (degenerative disc disease), lumbar    Diverticulosis    Family history of adverse reaction to anesthesia    mother and sisters-PONV   Gall stones    history of   GERD (gastroesophageal reflux disease)    EGD 8/09- non bleeding erosive gastritis, documentd esophageal ulcerations.    Hiatal hernia    small   History of kidney stones    History of pneumonia    Hypercholesterolemia    IBS (irritable bowel syndrome)    Malignant neoplasm of upper lobe of left lung ([place]) 12/27/2018   Meniere disease    Murmur    Osteoarthritis    lumbar disc disease, left hip   Personal history of chemotherapy    Personal history of radiation therapy    Pneumonia 12/2020   PONV (postoperative nausea and vomiting)    Sleep apnea    uses cpap   Vertigo    Weakness of right side of body      Past Surgical History:  Procedure Laterality Date   ABDOMINAL HYSTERECTOMY  age 3   ANTERIOR CERVICAL DECOMP/DISCECTOMY FUSION N/A 02/24/2015   Procedure: CERVICAL FOUR-FIVE, CERVICAL FIVE-SIX, CERVICAL SIX-SEVEN ANTERIOR CERVICAL DECOMPRESSION/DISCECTOMY FUSION ;  Surgeon: [name], MD;  Location: [facility];  Service: Neurosurgery;  Laterality: N/A;  C45 C56 C67 anterior cervical decompression with fusion interbody prosthesis plating and bonegraft   APPENDECTOMY     BACK SURGERY  1988   4th lumbar fusion   BLADDER SURGERY N/A    with vaginal wall repair   BREAST CYST ASPIRATION Bilateral    neg   BREAST SURGERY Bilateral    cyst removed and reduction   CARDIAC CATHETERIZATION  2014   CARPAL TUNNEL RELEASE  Right 02/11/2016   Procedure: CARPAL TUNNEL RELEASE;  Surgeon: [name], MD;  Location: [facility] ORS;  Service: Orthopedics;  Laterality: Right;   CATARACT EXTRACTION W/ INTRAOCULAR LENS IMPLANT Bilateral 2015   CHOLECYSTECTOMY     EUS N/A 05/31/2012   Procedure: UPPER ENDOSCOPIC ULTRASOUND (EUS) LINEAR;  Surgeon: [name], MD;  Location: [facility];  Service: Endoscopy;  Laterality: N/A;   EXCISIONAL HEMORRHOIDECTOMY     JOINT REPLACEMENT Bilateral    KNEE ARTHROSCOPY WITH LATERAL MENISECTOMY Right 07/07/2015   Procedure: KNEE ARTHROSCOPY WITH LATERAL MENISECTOMY, PARTIAL SYNOVECTOMY;  Surgeon: [name], MD;  Location: [facility] ORS;  Service: Orthopedics;  Laterality: Right;   LUMBAR LAMINECTOMY     PORTA CATH INSERTION N/A 01/07/2019   Procedure: PORTA CATH INSERTION;  Surgeon: [name], MD;  Location: [facility];  Service: Cardiovascular;  Laterality: N/A;   REDUCTION MAMMAPLASTY  1990   RIGHT OOPHORECTOMY     TOTAL HIP ARTHROPLASTY Left 05/01/2014   Dr. [name]   TOTAL HIP ARTHROPLASTY Right 08/05/2014   Procedure: TOTAL HIP ARTHROPLASTY ANTERIOR APPROACH;  Surgeon: [name], MD;  Location: [facility] ORS;  Service: Orthopedics;  Laterality: Right;   ULNAR NERVE TRANSPOSITION Right 02/11/2016   Procedure: ULNAR NERVE DECOMPRESSION/TRANSPOSITION;  Surgeon: [name], MD;  Location: [facility] OR[facility];  Service: Orthopedics;  Laterality: Right;   VIDEO BRONCHOSCOPY WITH ENDOBRONCHIAL ULTRASOUND Left 12/19/2018   Procedure: VIDEO BRONCHOSCOPY WITH ENDOBRONCHIAL ULTRASOUND, LEFT, SLEEP APNEA;  Surgeon: [name], MD;  Location: [facility] ORS;  Service: Thoracic;  Laterality: Left;   VIDEO BRONCHOSCOPY WITH ENDOBRONCHIAL ULTRASOUND Right 05/14/2021   Procedure: VIDEO BRONCHOSCOPY WITH ENDOBRONCHIAL ULTRASOUND;  Surgeon: Tyler Pita, MD;  Location: [facility] ORS;  Service: Pulmonary;  Laterality: Right;    Social History   Socioeconomic History   Marital status:  Married    Spouse name: Not on file   Number of children: Not on file   Years of education: Not on file   Highest education level: Not on file  Occupational History   Not on file  Tobacco Use   Smoking status: Former    Packs/day: 1.00    Years: 40.00    Additional pack years: 0.00    Total pack years: 40.00    Types: Cigarettes    Quit date: 06/13/2007    Years since quitting: 15.0   Smokeless tobacco: Never  Vaping Use   Vaping Use: Never used  Substance and Sexual Activity   Alcohol use: No    Alcohol/week: 0.0 standard drinks of alcohol   Drug use: No   Sexual activity: Not on file  Other Topics Concern   Not on file  Social History Narrative   Not on file   Social Determinants of Health   Financial Resource Strain: Not on file  Food Insecurity: No Food Insecurity (04/06/2022)   Hunger Vital Sign    Worried About Running Out of Food in the Last Year: Never true    Ran Out of Food in the Last Year: Never true  Transportation Needs: No Transportation Needs (04/06/2022)   PRAPARE - Hydrologist (Medical): No    Lack of Transportation (Non-Medical): No  Physical Activity: Not on file  Stress: Not on file  Social Connections: Not on file  Intimate Partner Violence: Not At Risk (04/06/2022)   Humiliation, Afraid, Rape, and Kick questionnaire    Fear of Current or Ex-Partner: No    Emotionally Abused: No    Physically Abused: No    Sexually Abused: No    Family History  Problem Relation Age of Onset   Heart disease Mother        s/p stent   Hypertension Mother    Hypercholesterolemia Mother    Diabetes Father    Stomach cancer Other        uncle   Breast cancer Cousin    Breast cancer Paternal Aunt      Current Outpatient Medications:    acetaminophen (TYLENOL) 500 MG tablet, Take 500 mg by mouth 2 (two) times daily., Disp: , Rfl:    albuterol (VENTOLIN HFA) 108 (90 Base) MCG/ACT inhaler, Inhale 2 puffs into the lungs every 6  (six) hours as needed for wheezing or shortness of breath., Disp: , Rfl:    Bioflavonoid Products (ESTER C PO), Take 1 tablet by mouth daily. 500 mg, Disp: , Rfl:    Calcium-Magnesium-Zinc (CAL-MAG-ZINC PO), Take 1 tablet by mouth daily., Disp: , Rfl:    cephALEXin (KEFLEX) 500 MG capsule, Take 500 mg by mouth 2 (two) times daily., Disp: , Rfl:    chlorhexidine (PERIDEX) 0.12 % solution, , Disp: , Rfl:  Cholecalciferol (EQL VITAMIN D3) 25 MCG (1000 UT) capsule, Take 1,000 Units by mouth daily., Disp: , Rfl:    ciprofloxacin (CIPRO) 500 MG tablet, , Disp: , Rfl:    Coenzyme Q10 (CO Q 10) 100 MG CAPS, Take 100 mg by mouth daily., Disp: , Rfl:    Cyanocobalamin (VITAMIN B-12) 2500 MCG SUBL, Place 2,500 mcg under the tongue daily., Disp: , Rfl:    cyclobenzaprine (FLEXERIL) 10 MG tablet, Take 10 mg by mouth at bedtime., Disp: , Rfl:    DHEA 25 MG CAPS, Take 1 capsule by mouth daily., Disp: , Rfl:    ezetimibe (ZETIA) 10 MG tablet, Take 5 mg by mouth in the morning and at bedtime., Disp: , Rfl:    fluconazole (DIFLUCAN) 150 MG tablet, , Disp: , Rfl:    folic acid (FOLVITE) A999333 MCG tablet, Take 400 mcg by mouth daily., Disp: , Rfl:    gabapentin (NEURONTIN) 100 MG capsule, , Disp: , Rfl:    Histamine Dihydrochloride (AUSTRALIAN DREAM ARTHRITIS) 0.025 % CREA, Apply 1 application. topically 4 (four) times daily as needed (pain)., Disp: , Rfl:    HYDROcodone-acetaminophen (NORCO/VICODIN) 5-325 MG tablet, Take 1 tablet by mouth at bedtime., Disp: 30 tablet, Rfl: 0   levOCARNitine (L-CARNITINE) 250 MG TABS, Take 1 tablet by mouth daily. With Chromium 250 mg, Disp: , Rfl:    loratadine (CLARITIN) 10 MG tablet, Take 10 mg by mouth daily., Disp: , Rfl:    ondansetron (ZOFRAN) 8 MG tablet, Take 1 tablet (8 mg total) by mouth every 8 (eight) hours as needed for nausea or vomiting., Disp: 20 tablet, Rfl: 0   pantoprazole (PROTONIX) 40 MG tablet, Take 40 mg by mouth 2 (two) times daily., Disp: , Rfl:     Polyethyl Glyc-Propyl Glyc PF (SYSTANE HYDRATION PF) 0.4-0.3 % SOLN, Place 1 drop into both eyes as needed. as, Disp: , Rfl:    polyethylene glycol powder (GLYCOLAX/MIRALAX) 17 GM/SCOOP powder, Take 17 g by mouth daily as needed for moderate constipation., Disp: , Rfl:    pyridOXINE (VITAMIN B-6) 100 MG tablet, Take 100 mg by mouth daily., Disp: , Rfl:    Sennosides (SENNA) 8.6 MG CAPS, Take 1 capsule by mouth daily as needed (constipation)., Disp: , Rfl:    spironolactone (ALDACTONE) 25 MG tablet, Take 25 mg by mouth., Disp: , Rfl:    sucralfate (CARAFATE) 1 GM/10ML suspension, Take 1 g by mouth daily as needed., Disp: , Rfl:    Tiotropium Bromide-Olodaterol (STIOLTO RESPIMAT) 2.5-2.5 MCG/ACT AERS, Inhale 2 puffs into the lungs daily., Disp: 4 g, Rfl: 10   torsemide (DEMADEX) 20 MG tablet, Take 20 mg by mouth once., Disp: , Rfl:    triamcinolone (NASACORT) 55 MCG/ACT AERO nasal inhaler, Place 2 sprays into the nose daily as needed., Disp: , Rfl:    UBRELVY 100 MG TABS, Take 100 mg by mouth daily as needed (migraines)., Disp: , Rfl:    Vitamin A 2400 MCG (8000 UT) CAPS, Take 8,000 Units by mouth daily., Disp: , Rfl:    Vitamin E 200 units TABS, Take 200 Units by mouth daily., Disp: , Rfl:    Wheat Dextrin (EQ FIBER POWDER PO), Take 1 Dose by mouth as needed., Disp: , Rfl:   Physical exam:  Vitals:   06/07/22 1353  BP: 122/69  Pulse: (!) 110  Resp: 18  Temp: 98.1 F (36.7 C)  TempSrc: Tympanic  SpO2: 98%  Weight: 164 lb 4.8 oz (74.5 kg)  Height: 5\' 1"  (  1.549 m)   Physical Exam Cardiovascular:     Rate and Rhythm: Normal rate and regular rhythm.     Heart sounds: Normal heart sounds.  Pulmonary:     Effort: Pulmonary effort is normal.     Comments: Mild bilateral wheezing Abdominal:     General: Bowel sounds are normal.     Palpations: Abdomen is soft.  Skin:    General: Skin is warm and dry.  Neurological:     Mental Status: She is alert and oriented to person, place, and time.          Latest Ref Rng & Units 05/13/2022    9:26 AM  CMP  Glucose 70 - 99 mg/dL 103   BUN 8 - 23 mg/dL 15   Creatinine 0.44 - 1.00 mg/dL 0.52   Sodium 135 - 145 mmol/L 138   Potassium 3.5 - 5.1 mmol/L 4.0   Chloride 98 - 111 mmol/L 103   CO2 22 - 32 mmol/L 27   Calcium 8.9 - 10.3 mg/dL 8.9   Total Protein 6.5 - 8.1 g/dL 6.5   Total Bilirubin 0.3 - 1.2 mg/dL 0.4   Alkaline Phos 38 - 126 U/L 86   AST 15 - 41 U/L 19   ALT 0 - 44 U/L 12       Latest Ref Rng & Units 05/13/2022    9:26 AM  CBC  WBC 4.0 - 10.5 K/uL 4.2   Hemoglobin 12.0 - 15.0 g/dL 12.3   Hematocrit 36.0 - 46.0 % 37.6   Platelets 150 - 400 K/uL 284     No images are attached to the encounter.  NM PET Image Restag (PS) Skull Base To Thigh  Result Date: 05/31/2022 CLINICAL DATA:  Subsequent treatment strategy for non-small cell lung cancer. Increasing pleural effusions and pleural thickening on CT. Thoracentesis performed 05/17/2022. EXAM: NUCLEAR MEDICINE PET SKULL BASE TO THIGH TECHNIQUE: 9.63 mCi F-18 FDG was injected intravenously. Full-ring PET imaging was performed from the skull base to thigh after the radiotracer. CT data was obtained and used for attenuation correction and anatomic localization. Fasting blood glucose: 88 mg/dl COMPARISON:  CT of the chest, abdomen and pelvis 05/13/2022 and 02/15/2022. PET-CT 04/12/2021. FINDINGS: Mediastinal blood pool activity: SUV max 2.2 NECK: No hypermetabolic cervical lymph nodes are identified. No suspicious activity identified within the pharyngeal mucosal space. Incidental CT findings: Bilateral carotid atherosclerosis. CHEST: There are recurrent hypermetabolic mediastinal and bilateral hilar lymph nodes. Right paratracheal nodes measuring 6 mm short axis on image 42/4 have an SUV max 11.5. There is a small hypermetabolic precarinal node with an SUV max of 5.6. There are small mildly hypermetabolic hilar lymph nodes bilaterally (SUV max 3.9 on the left). No axillary  adenopathy. Prominent hypermetabolic activity along the right atrial appendage is also noted (SUV max 6.2). This activity is nonspecific and potentially physiologic, although has increased from the previous PET-CT could reflect pericardial or pleural disease. No other significant hypermetabolic activity within the pleural space. There are 2 hypermetabolic right lung nodules. Approximately 9 mm right upper lobe nodule on image 52/4 has an SUV max of 6.4. Medial right lower lobe nodule measuring 9 mm on image 60/4 has an SUV max of 8.0. No hypermetabolic pulmonary activity or suspicious nodularity in the left lung. Incidental CT findings: Right IJ Port-A-Cath extends to the lower SVC. Atherosclerosis of the aorta, great vessels and coronary arteries. A small pericardial effusion and a moderate size dependent right pleural effusion appear unchanged from recent  CT. Grossly stable perihilar fibrotic changes in both lungs attributed to prior radiation therapy. ABDOMEN/PELVIS: There is no hypermetabolic activity within the liver, adrenal glands, spleen or pancreas. There is no hypermetabolic nodal activity in the abdomen or pelvis. Incidental CT findings: Stable mild extrahepatic biliary dilatation status post cholecystectomy. Punctate nonobstructing calculus in the lower pole of the right kidney. Aortic and branch vessel atherosclerosis without evidence of aneurysm. Previous hysterectomy. SKELETON: There is no hypermetabolic activity to suggest osseous metastatic disease. Incidental CT findings: Previous bilateral total hip arthroplasty. IMPRESSION: 1. Recurrent hypermetabolic mediastinal and bilateral hilar lymph nodes consistent with metastatic disease. 2. Hypermetabolic right lung nodules consistent with metastatic disease. 3. Nonspecific hypermetabolic activity along the right atrial appendage, potentially physiologic, although increased from the previous PET-CT. This could reflect pericardial or pleural disease. No  other abnormal pericardial or pleural metabolic activity. A moderate size right pleural effusion small pericardial effusion are unchanged in volume from recent diagnostic CTs. 4. No evidence of metastatic disease in the abdomen, pelvis or bones. 5. Stable perihilar fibrotic changes in both lungs attributed to prior radiation therapy. 6.  Aortic Atherosclerosis (ICD10-I70.0). Electronically Signed   By: Richardean Sale M.D.   On: 05/31/2022 14:06   US THORACENTESIS ASP PLEURAL SPACE W/IMG GUIDE  Result Date: 05/17/2022 INDICATION: History of COPD, stage III adenocarcinoma of lung with vocal cord paralysis status post radiation, currently on chemotherapy with recurrent right pleural effusion. Request received for left diagnostic and therapeutic thoracentesis. Upon ultrasound exam, there was no fluid on left and large right pleural effusion with patient complaint of dyspnea. EXAM: ULTRASOUND GUIDED DIAGNOSTIC AND THERAPEUTIC RIGHT THORACENTESIS MEDICATIONS: 10 mL 1 % lidocaine COMPLICATIONS: None immediate. PROCEDURE: An ultrasound guided thoracentesis was thoroughly discussed with the patient and questions answered. The benefits, risks, alternatives and complications were also discussed. The patient understands and wishes to proceed with the procedure. Written consent was obtained. Ultrasound was performed to localize and mark an adequate pocket of fluid in the right chest. The area was then prepped and draped in the normal sterile fashion. 1% Lidocaine was used for local anesthesia. Under ultrasound guidance a 6 Fr Safe-T-Centesis catheter was introduced. Thoracentesis was performed. The catheter was removed and a dressing applied. FINDINGS: A total of approximately 800 cc of clear, amber fluid was removed. Samples were sent to the laboratory as requested by the clinical team. IMPRESSION: Successful ultrasound guided right thoracentesis yielding 800 cc of pleural fluid. Read by: Narda Rutherford, AGNP-BC Electronically  Signed   By: Albin Felling M.D.   On: 05/17/2022 14:34   DG Chest Port 1 View  Result Date: 05/17/2022 CLINICAL DATA:  Pleural effusion EXAM: PORTABLE CHEST 1 VIEW COMPARISON:  Chest x-ray dated April 27, 2022; CT chest, abdomen and pelvis dated May 13, 2022 FINDINGS: Cardiac and mediastinal contours are unchanged. Stable position of right chest wall port. Unchanged right-greater-than-left perihilar linear consolidations, likely posttreatment change. Elevation of the right hemidiaphragm. Small right pleural effusion, new when compared with prior chest x-ray present on recent CT. No evidence of pneumothorax. IMPRESSION: 1. No evidence of pneumothorax. 2. Small right pleural effusion. Electronically Signed   By: Yetta Glassman M.D.   On: 05/17/2022 13:35     Assessment and plan- Patient is a 77 y.o. female with history of recurrent locally advanced non-small cell lung cancer adenocarcinoma here to discuss further management  Patient was initially treated for stage 3 non-small cell lung cancer adenocarcinoma in 2020 with concurrent chemoradiation with weekly CarboTaxol.  She  completed maintenance durvalumab in February 2022.  In January 2023 she had biopsy-proven recurrent adenocarcinoma.  She completed second round of chemoradiation with [place] Alimta ending in May 2023 and was on maintenance Alimta which were stopped in November 2023 due to stable scans as well as progressive anemia.  Unfortunately a repeat PET CT scan at this time again shows evidence of hypermetabolic mediastinal and bilateral hilar adenopathy as well as hypermetabolic right lung nodules consistent with metastatic disease.  We discussed her case at tumor board and given her chronic lung disease as well as need for oxygen and difficulty with prior bronchoscopies both bronchoscopy and CT-guided biopsy has not been deemed to be safe in her case.  Her pleural fluid is already been tapped 3 times and it was negative for malignancy.  We  therefore do not have any tissue diagnosis for the third time however the PET scan is highly concerning for recurrent disease.  I will therefore plan to restart single agent Alimta chemotherapy as she had responded to this in the past.  We will plan to start this next week.  If she progresses on Alimta I will switch her to alternative chemo regimens.  I am also sending of peripheral blood foundation 1 testing at this time to see if there is any actionable mutation.  In the past her NGS testing was negative for that.  Patient is also already received immunotherapy in the past and therefore rechallenging her with immunotherapy may not be helpful but is a consideration.  Again discussed risks and benefits of single agent Alimta including all but not limited to nausea vomiting low blood counts, risk of infections and hospitalizations as well as anemia and kidney injury.  Treatment will be given with a palliative intent.  Patient understands and agrees to proceed as planned   Visit Diagnosis 1. Malignant neoplasm of upper lobe of left lung ([place])   2. Goals of care, counseling/discussion      Dr. Randa Evens, MD, MPH [facility] at [facility] [id number] 06/12/2022 7:30 PM

## 2022-06-13 ENCOUNTER — Other Ambulatory Visit: Payer: Self-pay | Admitting: Oncology

## 2022-06-15 MED FILL — Dexamethasone Sodium Phosphate Inj 100 MG/10ML: INTRAMUSCULAR | Qty: 1 | Status: AC

## 2022-06-16 ENCOUNTER — Other Ambulatory Visit: Payer: Self-pay | Admitting: Oncology

## 2022-06-16 ENCOUNTER — Telehealth: Payer: Self-pay | Admitting: *Deleted

## 2022-06-16 ENCOUNTER — Inpatient Hospital Stay: Payer: Medicare Other

## 2022-06-16 NOTE — Telephone Encounter (Signed)
Called pt back and told her that she will come 4/8 at 8:45 for labs, then see rao and get tx. She is ok with this appts

## 2022-06-16 NOTE — Telephone Encounter (Signed)
Called to tell pt that we did not get her chemo drug in so we are not able to give chemo today. We want to get tx on Monday, but I will let pt know. She is ok with this.

## 2022-06-17 MED FILL — Dexamethasone Sodium Phosphate Inj 100 MG/10ML: INTRAMUSCULAR | Qty: 1 | Status: AC

## 2022-06-20 ENCOUNTER — Inpatient Hospital Stay: Payer: Medicare Other | Attending: Oncology

## 2022-06-20 ENCOUNTER — Inpatient Hospital Stay: Payer: Medicare Other

## 2022-06-20 ENCOUNTER — Inpatient Hospital Stay (HOSPITAL_BASED_OUTPATIENT_CLINIC_OR_DEPARTMENT_OTHER): Payer: Medicare Other | Admitting: Oncology

## 2022-06-20 ENCOUNTER — Encounter: Payer: Self-pay | Admitting: Oncology

## 2022-06-20 VITALS — BP 119/73 | HR 95 | Temp 96.8°F | Resp 18 | Wt 168.0 lb

## 2022-06-20 DIAGNOSIS — Z5111 Encounter for antineoplastic chemotherapy: Secondary | ICD-10-CM | POA: Insufficient documentation

## 2022-06-20 DIAGNOSIS — D6481 Anemia due to antineoplastic chemotherapy: Secondary | ICD-10-CM | POA: Insufficient documentation

## 2022-06-20 DIAGNOSIS — C3412 Malignant neoplasm of upper lobe, left bronchus or lung: Secondary | ICD-10-CM

## 2022-06-20 DIAGNOSIS — J90 Pleural effusion, not elsewhere classified: Secondary | ICD-10-CM | POA: Insufficient documentation

## 2022-06-20 LAB — COMPREHENSIVE METABOLIC PANEL
ALT: 10 U/L (ref 0–44)
AST: 18 U/L (ref 15–41)
Albumin: 3.7 g/dL (ref 3.5–5.0)
Alkaline Phosphatase: 77 U/L (ref 38–126)
Anion gap: 7 (ref 5–15)
BUN: 14 mg/dL (ref 8–23)
CO2: 27 mmol/L (ref 22–32)
Calcium: 8.7 mg/dL — ABNORMAL LOW (ref 8.9–10.3)
Chloride: 103 mmol/L (ref 98–111)
Creatinine, Ser: 0.56 mg/dL (ref 0.44–1.00)
GFR, Estimated: >60 mL/min (ref >60)
Glucose, Bld: 93 mg/dL (ref 70–99)
Potassium: 3.7 mmol/L (ref 3.5–5.1)
Sodium: 137 mmol/L (ref 135–145)
Total Bilirubin: 0.5 mg/dL (ref 0.3–1.2)
Total Protein: 6.3 g/dL — ABNORMAL LOW (ref 6.5–8.1)

## 2022-06-20 LAB — CBC WITH DIFFERENTIAL/PLATELET
Abs Immature Granulocytes: 0.01 10*3/uL (ref 0.00–0.07)
Basophils Absolute: 0 10*3/uL (ref 0.0–0.1)
Basophils Relative: 1 %
Eosinophils Absolute: 0.1 10*3/uL (ref 0.0–0.5)
Eosinophils Relative: 3 %
HCT: 33.5 % — ABNORMAL LOW (ref 36.0–46.0)
Hemoglobin: 11 g/dL — ABNORMAL LOW (ref 12.0–15.0)
Immature Granulocytes: 0 %
Lymphocytes Relative: 18 %
Lymphs Abs: 0.8 10*3/uL (ref 0.7–4.0)
MCH: 29.3 pg (ref 26.0–34.0)
MCHC: 32.8 g/dL (ref 30.0–36.0)
MCV: 89.1 fL (ref 80.0–100.0)
Monocytes Absolute: 0.3 10*3/uL (ref 0.1–1.0)
Monocytes Relative: 8 %
Neutro Abs: 3.1 10*3/uL (ref 1.7–7.7)
Neutrophils Relative %: 70 %
Platelets: 258 10*3/uL (ref 150–400)
RBC: 3.76 MIL/uL — ABNORMAL LOW (ref 3.87–5.11)
RDW: 13.2 % (ref 11.5–15.5)
WBC: 4.3 10*3/uL (ref 4.0–10.5)
nRBC: 0 % (ref 0.0–0.2)

## 2022-06-20 MED ORDER — HEPARIN SOD (PORK) LOCK FLUSH 100 UNIT/ML IV SOLN
500.0000 [IU] | Freq: Once | INTRAVENOUS | Status: AC | PRN
  Administered 2022-06-20: 500 [IU]
  Filled 2022-06-20: qty 5

## 2022-06-20 MED ORDER — CYANOCOBALAMIN 1000 MCG/ML IJ SOLN
1000.0000 ug | Freq: Once | INTRAMUSCULAR | Status: AC
  Administered 2022-06-20: 1000 ug via INTRAMUSCULAR
  Filled 2022-06-20: qty 1

## 2022-06-20 MED ORDER — SODIUM CHLORIDE 0.9 % IV SOLN
500.0000 mg/m2 | Freq: Once | INTRAVENOUS | Status: AC
  Administered 2022-06-20: 900 mg via INTRAVENOUS
  Filled 2022-06-20: qty 20

## 2022-06-20 MED ORDER — SODIUM CHLORIDE 0.9 % IV SOLN
10.0000 mg | Freq: Once | INTRAVENOUS | Status: AC
  Administered 2022-06-20: 10 mg via INTRAVENOUS
  Filled 2022-06-20: qty 10

## 2022-06-20 MED ORDER — SODIUM CHLORIDE 0.9 % IV SOLN
Freq: Once | INTRAVENOUS | Status: AC
  Filled 2022-06-20: qty 250

## 2022-06-20 NOTE — Progress Notes (Signed)
Pt in for follow up, reports doing well. Pt did have several medications that needed updated.  Pt also reports additional of iron to other the counter medications.

## 2022-06-20 NOTE — Progress Notes (Signed)
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  EGFR and Ros as well as MET testing negative. PD-L1 was 50%   Patient completed concurrent chemoradiation with weekly carbotaxol chemotherapy on 03/12/2019.  Maintenance durvalumab started in January 2021 after scan showed partial response.  Patient completed 1 year of maintenance durvalumab in February 2022   Patient was found to have enlarging mediastinal adenopathyOn subsequent CT scan in January 2023.  This was followed by a PET scan which showed hypermetabolic mediastinal and right hilar lymph nodes but no other evidence of disease elsewhere.  This was biopsied and was consistent with non-small cell lung cancer specifically adenocarcinoma.  Cells were positive for TTF-1 and Napsin A and negative for p40.   Patient completed concurrent chemoradiation with 4 cycles of carbo Alimta ending in May 2023.  Scan showed partial response.  Plan is for maintenance Alimta until progression or toxicity.  Alimta was stopped in November 2023 due to progressive anemia and stable scans   Patient was  hospitalized in November 2023 for acute on chronic hypoxic respiratory failure secondary to healthcare associated pneumonia.  She had pleural effusions which were tapped and thoracentesis was negative for malignancy.   Scans in March 2024 showed concern for progressive disease with appearance of new right lung nodules as well as progressive mediastinal adenopathy.  Also noted to have right pleural effusion which was tapped and was negative for malignancy  Interval history-today she feels well overall.  She is not coughing and not wheezing as much.  She is on 2 L of chronic oxygen.  Her daughter visited her recently and she was able to get out of the house and walk around more.  ECOG PS- 2 Pain scale- 0   Review of systems- Review of Systems  Constitutional:  Positive for malaise/fatigue. Negative for chills, fever and weight loss.  HENT:  Negative for congestion, ear discharge and nosebleeds.   Eyes:  Negative for blurred vision.  Respiratory:  Negative for cough, hemoptysis, sputum production, shortness of breath and wheezing.   Cardiovascular:  Negative for chest pain, palpitations, orthopnea and claudication.  Gastrointestinal:  Negative for abdominal pain, blood in stool, constipation, diarrhea, heartburn, melena, nausea and vomiting.  Genitourinary:  Negative for dysuria, flank pain, frequency, hematuria and urgency.  Musculoskeletal:  Negative for back pain, joint pain and myalgias.  Skin:  Negative for rash.  Neurological:  Negative for dizziness, tingling, focal weakness, seizures, weakness and headaches.  Endo/Heme/Allergies:  Does not bruise/bleed easily.  Psychiatric/Behavioral:  Negative for depression and suicidal ideas. The patient does not have insomnia.       Allergies  Allergen Reactions   Aspirin Hives, Shortness Of Breath and Other (  See Comments)    Difficulty breathing   Celebrex [Celecoxib] Shortness Of Breath   Morphine And Related Nausea And Vomiting and Swelling    Adhesive [Tape] Other (See Comments)    Took top layer of skin off when removed.   Clarithromycin Nausea And Vomiting   Codeine Nausea And Vomiting   Darvon [Propoxyphene Hcl] Nausea And Vomiting   Demerol [Meperidine] Nausea And Vomiting   Flonase [Fluticasone Propionate] Other (See Comments)    Fungal infection in nose   Simvastatin Other (See Comments)    "caused ulcers in mouth, and fever"   Talwin [Pentazocine] Nausea And Vomiting     Past Medical History:  Diagnosis Date   Anemia    Asthma    No Inhalers--Dr. Meredeth Ide will order as needed   Bronchiectasis    mild   Chronic headaches     followed by [facility] migraines   COPD (chronic obstructive pulmonary disease)    DDD (degenerative disc disease), lumbar    Diverticulosis    Family history of adverse reaction to anesthesia    mother and sisters-PONV   Gall stones    history of   GERD (gastroesophageal reflux disease)    EGD 8/09- non bleeding erosive gastritis, documentd esophageal ulcerations.    Hiatal hernia    small   History of kidney stones    History of pneumonia    Hypercholesterolemia    IBS (irritable bowel syndrome)    Malignant neoplasm of upper lobe of left lung 12/27/2018   Meniere disease    Murmur    Osteoarthritis    lumbar disc disease, left hip   Personal history of chemotherapy    Personal history of radiation therapy    Pneumonia 12/2020   PONV (postoperative nausea and vomiting)    Sleep apnea    uses cpap   Vertigo    Weakness of right side of body      Past Surgical History:  Procedure Laterality Date   ABDOMINAL HYSTERECTOMY  age 62   ANTERIOR CERVICAL DECOMP/DISCECTOMY FUSION N/A 02/24/2015   Procedure: CERVICAL FOUR-FIVE, CERVICAL FIVE-SIX, CERVICAL SIX-SEVEN ANTERIOR CERVICAL DECOMPRESSION/DISCECTOMY FUSION ;  Surgeon: Lisbeth Renshaw, MD;  Location: [facility];  Service: Neurosurgery;  Laterality: N/A;  C45 C56 C67 anterior cervical decompression with fusion  interbody prosthesis plating and bonegraft   APPENDECTOMY     BACK SURGERY  1988   4th lumbar fusion   BLADDER SURGERY N/A    with vaginal wall repair   BREAST CYST ASPIRATION Bilateral    neg   BREAST SURGERY Bilateral    cyst removed and reduction   CARDIAC CATHETERIZATION  2014   CARPAL TUNNEL RELEASE Right 02/11/2016   Procedure: CARPAL TUNNEL RELEASE;  Surgeon: Kennedy Bucker, MD;  Location: [facility] OR[facility];  Service: Orthopedics;  Laterality: Right;   CATARACT EXTRACTION W/ INTRAOCULAR LENS IMPLANT Bilateral 2015   CHOLECYSTECTOMY     EUS N/A 05/31/2012   Procedure: UPPER ENDOSCOPIC ULTRASOUND (EUS) LINEAR;  Surgeon: Rachael Fee, MD;  Location: [facility];  Service: Endoscopy;  Laterality: N/A;   EXCISIONAL HEMORRHOIDECTOMY     JOINT REPLACEMENT Bilateral    KNEE ARTHROSCOPY WITH LATERAL MENISECTOMY Right 07/07/2015   Procedure: KNEE ARTHROSCOPY WITH LATERAL MENISECTOMY, PARTIAL SYNOVECTOMY;  Surgeon: Kennedy Bucker, MD;  Location: [facility];  Service: Orthopedics;  Laterality: Right;   LUMBAR LAMINECTOMY     PORTA CATH INSERTION N/A 01/07/2019   Procedure: PORTA CATH INSERTION;  Surgeon: Annice Needy, MD;  Location: [facility];  Service: Cardiovascular;  Laterality: N/A;   REDUCTION MAMMAPLASTY  1990   RIGHT OOPHORECTOMY     TOTAL HIP ARTHROPLASTY Left 05/01/2014   Dr. Sheppard Evens   TOTAL HIP ARTHROPLASTY Right 08/05/2014   Procedure: TOTAL HIP ARTHROPLASTY ANTERIOR APPROACH;  Surgeon: Kennedy Bucker, MD;  Location: [facility];  Service: Orthopedics;  Laterality: Right;   ULNAR NERVE TRANSPOSITION Right 02/11/2016   Procedure: ULNAR NERVE DECOMPRESSION/TRANSPOSITION;  Surgeon: Kennedy Bucker, MD;  Location: [facility];  Service: Orthopedics;  Laterality: Right;   VIDEO BRONCHOSCOPY WITH ENDOBRONCHIAL ULTRASOUND Left 12/19/2018   Procedure: VIDEO BRONCHOSCOPY WITH ENDOBRONCHIAL ULTRASOUND, LEFT, SLEEP APNEA;  Surgeon: Vida Rigger, MD;  Location: [facility] OR[facility];  Service: Thoracic;   Laterality: Left;   VIDEO BRONCHOSCOPY WITH ENDOBRONCHIAL ULTRASOUND Right 05/14/2021   Procedure: VIDEO BRONCHOSCOPY WITH ENDOBRONCHIAL ULTRASOUND;  Surgeon: Salena Saner, MD;  Location: [facility];  Service: Pulmonary;  Laterality: Right;    Social History   Socioeconomic History   Marital status: Married    Spouse name: Not on file   Number of children: Not on file   Years of education: Not on file   Highest education level: Not on file  Occupational History   Not on file  Tobacco Use   Smoking status: Former    Packs/day: 1.00    Years: 40.00    Additional pack years: 0.00    Total pack years: 40.00    Types: Cigarettes    Quit date: 06/13/2007    Years since quitting: 15.0   Smokeless tobacco: Never  Vaping Use   Vaping Use: Never used  Substance and Sexual Activity   Alcohol use: No    Alcohol/week: 0.0 standard drinks of alcohol   Drug use: No   Sexual activity: Not on file  Other Topics Concern   Not on file  Social History Narrative   Not on file   Social Determinants of Health   Financial Resource Strain: Not on file  Food Insecurity: No Food Insecurity (04/06/2022)   Hunger Vital Sign    Worried About Running Out of Food in the Last Year: Never true    Ran Out of Food in the Last Year: Never true  Transportation Needs: No Transportation Needs (04/06/2022)   PRAPARE - Administrator, Civil Service (Medical): No    Lack of Transportation (Non-Medical): No  Physical Activity: Not on file  Stress: Not on file  Social Connections: Not on file  Intimate Partner Violence: Not At Risk (04/06/2022)   Humiliation, Afraid, Rape, and Kick questionnaire    Fear of Current or Ex-Partner: No    Emotionally Abused: No    Physically Abused: No    Sexually Abused: No    Family History  Problem Relation Age of Onset   Heart disease Mother        s/p stent   Hypertension Mother    Hypercholesterolemia Mother    Diabetes Father    Stomach cancer Other         uncle   Breast cancer Cousin    Breast cancer Paternal Aunt      Current Outpatient Medications:    acetaminophen (TYLENOL) 500 MG tablet, Take 500 mg by mouth 2 (two) times daily., Disp: , Rfl:    Bioflavonoid Products (ESTER C PO), Take 1 tablet by mouth daily. 500 mg, Disp: , Rfl:    Calcium-Magnesium-Zinc (CAL-MAG-ZINC PO), Take 1 tablet by mouth daily., Disp: , Rfl:  Cholecalciferol (EQL VITAMIN D3) 25 MCG (1000 UT) capsule, Take 1,000 Units by mouth daily., Disp: , Rfl:    Coenzyme Q10 (CO Q 10) 100 MG CAPS, Take 100 mg by mouth daily., Disp: , Rfl:    cyclobenzaprine (FLEXERIL) 10 MG tablet, Take 10 mg by mouth at bedtime., Disp: , Rfl:    DHEA 25 MG CAPS, Take 1 capsule by mouth daily., Disp: , Rfl:    ezetimibe (ZETIA) 10 MG tablet, Take 5 mg by mouth in the morning and at bedtime., Disp: , Rfl:    Histamine Dihydrochloride (AUSTRALIAN DREAM ARTHRITIS) 0.025 % CREA, Apply 1 application. topically 4 (four) times daily as needed (pain)., Disp: , Rfl:    HYDROcodone-acetaminophen (NORCO/VICODIN) 5-325 MG tablet, Take 1 tablet by mouth at bedtime., Disp: 30 tablet, Rfl: 0   loratadine (CLARITIN) 10 MG tablet, Take 10 mg by mouth daily., Disp: , Rfl:    pantoprazole (PROTONIX) 40 MG tablet, Take 40 mg by mouth 2 (two) times daily., Disp: , Rfl:    Polyethyl Glyc-Propyl Glyc PF (SYSTANE HYDRATION PF) 0.4-0.3 % SOLN, Place 1 drop into both eyes as needed. as, Disp: , Rfl:    polyethylene glycol powder (GLYCOLAX/MIRALAX) 17 GM/SCOOP powder, Take 17 g by mouth daily as needed for moderate constipation., Disp: , Rfl:    Sennosides (SENNA) 8.6 MG CAPS, Take 1 capsule by mouth daily as needed (constipation)., Disp: , Rfl:    spironolactone (ALDACTONE) 25 MG tablet, Take 25 mg by mouth., Disp: , Rfl:    torsemide (DEMADEX) 20 MG tablet, Take 20 mg by mouth once., Disp: , Rfl:    UBRELVY 100 MG TABS, Take 100 mg by mouth daily as needed (migraines)., Disp: , Rfl:    Wheat Dextrin (EQ  FIBER POWDER PO), Take 1 Dose by mouth as needed., Disp: , Rfl:    albuterol (VENTOLIN HFA) 108 (90 Base) MCG/ACT inhaler, Inhale 2 puffs into the lungs every 6 (six) hours as needed for wheezing or shortness of breath. (Patient not taking: Reported on 06/20/2022), Disp: , Rfl:    chlorhexidine (PERIDEX) 0.12 % solution, , Disp: , Rfl:    ondansetron (ZOFRAN) 8 MG tablet, Take 1 tablet (8 mg total) by mouth every 8 (eight) hours as needed for nausea or vomiting. (Patient not taking: Reported on 06/20/2022), Disp: 20 tablet, Rfl: 0   triamcinolone (NASACORT) 55 MCG/ACT AERO nasal inhaler, Place 2 sprays into the nose daily as needed. (Patient not taking: Reported on 06/20/2022), Disp: , Rfl:  No current facility-administered medications for this visit.  Facility-Administered Medications Ordered in Other Visits:    0.9 %  sodium chloride infusion, , Intravenous, Once, Creig Hines, MD   cyanocobalamin (VITAMIN B12) injection 1,000 mcg, 1,000 mcg, Intramuscular, Once, Creig Hines, MD   dexamethasone (DECADRON) 10 mg in sodium chloride 0.9 % 50 mL IVPB, 10 mg, Intravenous, Once, Creig Hines, MD   PEMEtrexed (ALIMTA) 900 mg in sodium chloride 0.9 % 100 mL chemo infusion, 500 mg/m2 (Treatment Plan Recorded), Intravenous, Once, Creig Hines, MD  Physical exam:  Vitals:   06/20/22 0918  BP: 119/73  Pulse: 95  Resp: 18  Temp: (!) 96.8 F (36 C)  TempSrc: Tympanic  SpO2: 100%  Weight: 168 lb (76.2 kg)   Physical Exam Cardiovascular:     Rate and Rhythm: Normal rate and regular rhythm.     Heart sounds: Normal heart sounds.  Pulmonary:     Effort: Pulmonary effort is normal.  Breath sounds: Normal breath sounds.     Comments: She is on 2 L of oxygen Abdominal:     General: Bowel sounds are normal.     Palpations: Abdomen is soft.  Skin:    General: Skin is warm and dry.  Neurological:     Mental Status: She is alert and oriented to person, place, and time.         Latest Ref  Rng & Units 06/20/2022    8:52 AM  CMP  Glucose 70 - 99 mg/dL 93   BUN 8 - 23 mg/dL 14   Creatinine 1.44 - 1.00 mg/dL 8.18   Sodium 563 - 149 mmol/L 137   Potassium 3.5 - 5.1 mmol/L 3.7   Chloride 98 - 111 mmol/L 103   CO2 22 - 32 mmol/L 27   Calcium 8.9 - 10.3 mg/dL 8.7   Total Protein 6.5 - 8.1 g/dL 6.3   Total Bilirubin 0.3 - 1.2 mg/dL 0.5   Alkaline Phos 38 - 126 U/L 77   AST 15 - 41 U/L 18   ALT 0 - 44 U/L 10       Latest Ref Rng & Units 06/20/2022    8:52 AM  CBC  WBC 4.0 - 10.5 K/uL 4.3   Hemoglobin 12.0 - 15.0 g/dL 70.2   Hematocrit 63.7 - 46.0 % 33.5   Platelets 150 - 400 K/uL 258     No images are attached to the encounter.  NM PET Image Restag (PS) Skull Base To Thigh  Result Date: 05/31/2022 CLINICAL DATA:  Subsequent treatment strategy for non-small cell lung cancer. Increasing pleural effusions and pleural thickening on CT. Thoracentesis performed 05/17/2022. EXAM: NUCLEAR MEDICINE PET SKULL BASE TO THIGH TECHNIQUE: 9.63 mCi F-18 FDG was injected intravenously. Full-ring PET imaging was performed from the skull base to thigh after the radiotracer. CT data was obtained and used for attenuation correction and anatomic localization. Fasting blood glucose: 88 mg/dl COMPARISON:  CT of the chest, abdomen and pelvis 05/13/2022 and 02/15/2022. PET-CT 04/12/2021. FINDINGS: Mediastinal blood pool activity: SUV max 2.2 NECK: No hypermetabolic cervical lymph nodes are identified. No suspicious activity identified within the pharyngeal mucosal space. Incidental CT findings: Bilateral carotid atherosclerosis. CHEST: There are recurrent hypermetabolic mediastinal and bilateral hilar lymph nodes. Right paratracheal nodes measuring 6 mm short axis on image 42/4 have an SUV max 11.5. There is a small hypermetabolic precarinal node with an SUV max of 5.6. There are small mildly hypermetabolic hilar lymph nodes bilaterally (SUV max 3.9 on the left). No axillary adenopathy. Prominent  hypermetabolic activity along the right atrial appendage is also noted (SUV max 6.2). This activity is nonspecific and potentially physiologic, although has increased from the previous PET-CT could reflect pericardial or pleural disease. No other significant hypermetabolic activity within the pleural space. There are 2 hypermetabolic right lung nodules. Approximately 9 mm right upper lobe nodule on image 52/4 has an SUV max of 6.4. Medial right lower lobe nodule measuring 9 mm on image 60/4 has an SUV max of 8.0. No hypermetabolic pulmonary activity or suspicious nodularity in the left lung. Incidental CT findings: Right IJ Port-A-Cath extends to the lower SVC. Atherosclerosis of the aorta, great vessels and coronary arteries. A small pericardial effusion and a moderate size dependent right pleural effusion appear unchanged from recent CT. Grossly stable perihilar fibrotic changes in both lungs attributed to prior radiation therapy. ABDOMEN/PELVIS: There is no hypermetabolic activity within the liver, adrenal glands, spleen or pancreas. There is  no hypermetabolic nodal activity in the abdomen or pelvis. Incidental CT findings: Stable mild extrahepatic biliary dilatation status post cholecystectomy. Punctate nonobstructing calculus in the lower pole of the right kidney. Aortic and branch vessel atherosclerosis without evidence of aneurysm. Previous hysterectomy. SKELETON: There is no hypermetabolic activity to suggest osseous metastatic disease. Incidental CT findings: Previous bilateral total hip arthroplasty. IMPRESSION: 1. Recurrent hypermetabolic mediastinal and bilateral hilar lymph nodes consistent with metastatic disease. 2. Hypermetabolic right lung nodules consistent with metastatic disease. 3. Nonspecific hypermetabolic activity along the right atrial appendage, potentially physiologic, although increased from the previous PET-CT. This could reflect pericardial or pleural disease. No other abnormal  pericardial or pleural metabolic activity. A moderate size right pleural effusion small pericardial effusion are unchanged in volume from recent diagnostic CTs. 4. No evidence of metastatic disease in the abdomen, pelvis or bones. 5. Stable perihilar fibrotic changes in both lungs attributed to prior radiation therapy. 6.  Aortic Atherosclerosis (ICD10-I70.0). Electronically Signed   By: Carey Bullocks M.D.   On: 05/31/2022 14:06     Assessment and plan- Patient is a 77 y.o. female with history of recurrent locally advanced non-small cell lung cancer adenocarcinoma.  She is here for on treatment assessment prior to next cycle of Alimta  Alimta was on hold since November 2023 because of stable scans as well as progressive anemia.  Her anemia has improved and scans show progression of mediastinal adenopathy and appearance of new right lung nodules.  Plan is to do 3-4 cycles of Alimta followed by repeat scans.  She will proceed with Alimta today and I will see her back in 3 weeks for cycle 2.  She will receive a B12 injection today and continue that every 9 weeks.     Visit Diagnosis 1. Malignant neoplasm of upper lobe of left lung   2. Encounter for antineoplastic chemotherapy      Dr. Owens Shark, MD, MPH [facility] [phone] 06/20/2022 9:42 AM

## 2022-06-20 NOTE — Patient Instructions (Signed)
[facility]  Discharge Instructions: Thank you for choosing [facility] to provide your oncology and hematology care.  If you have a lab appointment with the Cancer Center, please go directly to the Cancer Center and check in at the registration area.  Wear comfortable clothing and clothing appropriate for easy access to any Portacath or PICC line.   We strive to give you quality time with your provider. You may need to reschedule your appointment if you arrive late (15 or more minutes).  Arriving late affects you and other patients whose appointments are after yours.  Also, if you miss three or more appointments without notifying the office, you may be dismissed from the clinic at the provider's discretion.      For prescription refill requests, have your pharmacy contact our office and allow 72 hours for refills to be completed.     To help prevent nausea and vomiting after your treatment, we encourage you to take your nausea medication as directed.  BELOW ARE SYMPTOMS THAT SHOULD BE REPORTED IMMEDIATELY: *FEVER GREATER THAN 100.4 F (38 C) OR HIGHER *CHILLS OR SWEATING *NAUSEA AND VOMITING THAT IS NOT CONTROLLED WITH YOUR NAUSEA MEDICATION *UNUSUAL SHORTNESS OF BREATH *UNUSUAL BRUISING OR BLEEDING *URINARY PROBLEMS (pain or burning when urinating, or frequent urination) *BOWEL PROBLEMS (unusual diarrhea, constipation, pain near the anus) TENDERNESS IN MOUTH AND THROAT WITH OR WITHOUT PRESENCE OF ULCERS (sore throat, sores in mouth, or a toothache) UNUSUAL RASH, SWELLING OR PAIN  UNUSUAL VAGINAL DISCHARGE OR ITCHING   Items with * indicate a potential emergency and should be followed up as soon as possible or go to the Emergency Department if any problems should occur.  Please show the CHEMOTHERAPY ALERT CARD or IMMUNOTHERAPY ALERT CARD at check-in to the Emergency Department and triage nurse.  Should you have questions after your visit  or need to cancel or reschedule your appointment, please contact [facility]  [phone] and follow the prompts.  Office hours are 8:00 a.m. to 4:30 p.m. Monday - Friday. Please note that voicemails left after 4:00 p.m. may not be returned until the following business day.  We are closed weekends and major holidays. You have access to a nurse at all times for urgent questions. Please call the main number to the clinic [phone] and follow the prompts.  For any non-urgent questions, you may also contact your provider using MyChart. We now offer e-Visits for anyone 18 and older to request care online for non-urgent symptoms. For details visit [email or web address].   Also download the MyChart app! Go to the app store, search "MyChart", open the app, select [facility], and log in with your MyChart username and password.    

## 2022-06-22 ENCOUNTER — Other Ambulatory Visit: Payer: Self-pay | Admitting: Pulmonary Disease

## 2022-06-22 DIAGNOSIS — J90 Pleural effusion, not elsewhere classified: Secondary | ICD-10-CM

## 2022-06-23 ENCOUNTER — Other Ambulatory Visit: Payer: Self-pay | Admitting: Pulmonary Disease

## 2022-06-23 ENCOUNTER — Ambulatory Visit
Admission: RE | Admit: 2022-06-23 | Discharge: 2022-06-23 | Disposition: A | Discharge status: Home / Self Care | Payer: Medicare Other | Source: Ambulatory Visit | Attending: Pulmonary Disease | Admitting: Pulmonary Disease

## 2022-06-23 DIAGNOSIS — J90 Pleural effusion, not elsewhere classified: Secondary | ICD-10-CM | POA: Insufficient documentation

## 2022-06-23 MED ORDER — LIDOCAINE HCL (PF) 1 % IJ SOLN
10.0000 mL | Freq: Once | INTRAMUSCULAR | Status: AC
  Administered 2022-06-23: 10 mL via INTRADERMAL

## 2022-06-23 NOTE — Procedures (Signed)
PROCEDURE SUMMARY:  Successful US guided right thoracentesis. Yielded 650 mL of amber colored fluid. Pt tolerated procedure well. No immediate complications.  Specimen was not sent for labs. CXR ordered.  EBL < 61mL  Cloretta Ned 06/23/2022 10:55 AM

## 2022-07-05 ENCOUNTER — Ambulatory Visit: Payer: Medicare Other | Admitting: Oncology

## 2022-07-05 ENCOUNTER — Other Ambulatory Visit: Payer: Medicare Other

## 2022-07-05 ENCOUNTER — Ambulatory Visit: Payer: Medicare Other

## 2022-07-08 MED FILL — Dexamethasone Sodium Phosphate Inj 100 MG/10ML: INTRAMUSCULAR | Qty: 1 | Status: AC

## 2022-07-11 ENCOUNTER — Encounter: Payer: Self-pay | Admitting: Oncology

## 2022-07-11 ENCOUNTER — Inpatient Hospital Stay (HOSPITAL_BASED_OUTPATIENT_CLINIC_OR_DEPARTMENT_OTHER): Payer: Medicare Other | Admitting: Oncology

## 2022-07-11 ENCOUNTER — Inpatient Hospital Stay: Payer: Medicare Other

## 2022-07-11 VITALS — BP 116/75 | HR 102 | Temp 97.3°F | Resp 18 | Ht 61.0 in | Wt 159.9 lb

## 2022-07-11 DIAGNOSIS — Z5111 Encounter for antineoplastic chemotherapy: Secondary | ICD-10-CM | POA: Diagnosis not present

## 2022-07-11 DIAGNOSIS — C3412 Malignant neoplasm of upper lobe, left bronchus or lung: Secondary | ICD-10-CM | POA: Diagnosis not present

## 2022-07-11 DIAGNOSIS — D6481 Anemia due to antineoplastic chemotherapy: Secondary | ICD-10-CM | POA: Diagnosis not present

## 2022-07-11 DIAGNOSIS — T451X5A Adverse effect of antineoplastic and immunosuppressive drugs, initial encounter: Secondary | ICD-10-CM

## 2022-07-11 LAB — COMPREHENSIVE METABOLIC PANEL
ALT: 12 U/L (ref 0–44)
AST: 18 U/L (ref 15–41)
Albumin: 3.4 g/dL — ABNORMAL LOW (ref 3.5–5.0)
Alkaline Phosphatase: 81 U/L (ref 38–126)
Anion gap: 8 (ref 5–15)
BUN: 15 mg/dL (ref 8–23)
CO2: 30 mmol/L (ref 22–32)
Calcium: 9.1 mg/dL (ref 8.9–10.3)
Chloride: 95 mmol/L — ABNORMAL LOW (ref 98–111)
Creatinine, Ser: 0.63 mg/dL (ref 0.44–1.00)
GFR, Estimated: >60 mL/min (ref >60)
Glucose, Bld: 92 mg/dL (ref 70–99)
Potassium: 3.9 mmol/L (ref 3.5–5.1)
Sodium: 133 mmol/L — ABNORMAL LOW (ref 135–145)
Total Bilirubin: 0.3 mg/dL (ref 0.3–1.2)
Total Protein: 6.6 g/dL (ref 6.5–8.1)

## 2022-07-11 LAB — CBC WITH DIFFERENTIAL/PLATELET
Abs Immature Granulocytes: 0.02 10*3/uL (ref 0.00–0.07)
Basophils Absolute: 0 10*3/uL (ref 0.0–0.1)
Basophils Relative: 1 %
Eosinophils Absolute: 0.1 10*3/uL (ref 0.0–0.5)
Eosinophils Relative: 1 %
HCT: 31.1 % — ABNORMAL LOW (ref 36.0–46.0)
Hemoglobin: 10.3 g/dL — ABNORMAL LOW (ref 12.0–15.0)
Immature Granulocytes: 1 %
Lymphocytes Relative: 15 %
Lymphs Abs: 0.7 10*3/uL (ref 0.7–4.0)
MCH: 29.7 pg (ref 26.0–34.0)
MCHC: 33.1 g/dL (ref 30.0–36.0)
MCV: 89.6 fL (ref 80.0–100.0)
Monocytes Absolute: 0.6 10*3/uL (ref 0.1–1.0)
Monocytes Relative: 13 %
Neutro Abs: 3.1 10*3/uL (ref 1.7–7.7)
Neutrophils Relative %: 69 %
Platelets: 444 10*3/uL — ABNORMAL HIGH (ref 150–400)
RBC: 3.47 MIL/uL — ABNORMAL LOW (ref 3.87–5.11)
RDW: 13.8 % (ref 11.5–15.5)
WBC: 4.4 10*3/uL (ref 4.0–10.5)
nRBC: 0 % (ref 0.0–0.2)

## 2022-07-11 MED ORDER — SODIUM CHLORIDE 0.9 % IV SOLN
500.0000 mg/m2 | Freq: Once | INTRAVENOUS | Status: AC
  Administered 2022-07-11: 900 mg via INTRAVENOUS
  Filled 2022-07-11: qty 36

## 2022-07-11 MED ORDER — CYANOCOBALAMIN 1000 MCG/ML IJ SOLN
1000.0000 ug | Freq: Once | INTRAMUSCULAR | Status: AC
  Administered 2022-07-11: 1000 ug via INTRAMUSCULAR
  Filled 2022-07-11: qty 1

## 2022-07-11 MED ORDER — SODIUM CHLORIDE 0.9% FLUSH
10.0000 mL | INTRAVENOUS | Status: DC | PRN
  Filled 2022-07-11: qty 10

## 2022-07-11 MED ORDER — SODIUM CHLORIDE 0.9 % IV SOLN
10.0000 mg | Freq: Once | INTRAVENOUS | Status: AC
  Administered 2022-07-11: 10 mg via INTRAVENOUS
  Filled 2022-07-11: qty 10

## 2022-07-11 MED ORDER — SODIUM CHLORIDE 0.9 % IV SOLN
Freq: Once | INTRAVENOUS | Status: AC
  Filled 2022-07-11: qty 250

## 2022-07-11 MED ORDER — HEPARIN SOD (PORK) LOCK FLUSH 100 UNIT/ML IV SOLN
500.0000 [IU] | Freq: Once | INTRAVENOUS | Status: AC | PRN
  Administered 2022-07-11: 500 [IU]
  Filled 2022-07-11: qty 5

## 2022-07-11 NOTE — Patient Instructions (Signed)
[facility]  Discharge Instructions: Thank you for choosing [facility] to provide your oncology and hematology care.  If you have a lab appointment with the Cancer Center, please go directly to the Cancer Center and check in at the registration area.  Wear comfortable clothing and clothing appropriate for easy access to any Portacath or PICC line.   We strive to give you quality time with your provider. You may need to reschedule your appointment if you arrive late (15 or more minutes).  Arriving late affects you and other patients whose appointments are after yours.  Also, if you miss three or more appointments without notifying the office, you may be dismissed from the clinic at the provider's discretion.      For prescription refill requests, have your pharmacy contact our office and allow 72 hours for refills to be completed.     To help prevent nausea and vomiting after your treatment, we encourage you to take your nausea medication as directed.  BELOW ARE SYMPTOMS THAT SHOULD BE REPORTED IMMEDIATELY: *FEVER GREATER THAN 100.4 F (38 C) OR HIGHER *CHILLS OR SWEATING *NAUSEA AND VOMITING THAT IS NOT CONTROLLED WITH YOUR NAUSEA MEDICATION *UNUSUAL SHORTNESS OF BREATH *UNUSUAL BRUISING OR BLEEDING *URINARY PROBLEMS (pain or burning when urinating, or frequent urination) *BOWEL PROBLEMS (unusual diarrhea, constipation, pain near the anus) TENDERNESS IN MOUTH AND THROAT WITH OR WITHOUT PRESENCE OF ULCERS (sore throat, sores in mouth, or a toothache) UNUSUAL RASH, SWELLING OR PAIN  UNUSUAL VAGINAL DISCHARGE OR ITCHING   Items with * indicate a potential emergency and should be followed up as soon as possible or go to the Emergency Department if any problems should occur.  Please show the CHEMOTHERAPY ALERT CARD or IMMUNOTHERAPY ALERT CARD at check-in to the Emergency Department and triage nurse.  Should you have questions after your visit  or need to cancel or reschedule your appointment, please contact [facility]  [phone] and follow the prompts.  Office hours are 8:00 a.m. to 4:30 p.m. Monday - Friday. Please note that voicemails left after 4:00 p.m. may not be returned until the following business day.  We are closed weekends and major holidays. You have access to a nurse at all times for urgent questions. Please call the main number to the clinic [phone] and follow the prompts.  For any non-urgent questions, you may also contact your provider using MyChart. We now offer e-Visits for anyone 18 and older to request care online for non-urgent symptoms. For details visit [email or web address].   Also download the MyChart app! Go to the app store, search "MyChart", open the app, select [facility], and log in with your MyChart username and password.    

## 2022-07-11 NOTE — Progress Notes (Signed)
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  EGFR and Ros as well as MET testing negative. PD-L1 was 50%   Patient completed concurrent chemoradiation with weekly carbotaxol chemotherapy on 03/12/2019.  Maintenance durvalumab started in January 2021 after scan showed partial response.  Patient completed 1 year of maintenance durvalumab in February 2022   Patient was found to have enlarging mediastinal adenopathyOn subsequent CT scan in January 2023.  This was followed by a PET scan which showed hypermetabolic mediastinal and right hilar lymph nodes but no other evidence of disease elsewhere.  This was biopsied and was consistent with non-small cell lung cancer specifically adenocarcinoma.  Cells were positive for TTF-1 and Napsin A and negative for p40.   Patient completed concurrent chemoradiation with 4 cycles of carbo Alimta ending in May 2023.  Scan showed partial response.  Plan is for maintenance Alimta until progression or toxicity.  Alimta was stopped in November 2023 due to progressive anemia and stable scans   Patient was  hospitalized in November 2023 for acute on chronic hypoxic respiratory failure secondary to healthcare associated pneumonia.  She had pleural effusions which were tapped and thoracentesis was negative for malignancy.   Scans in March 2024 showed concern for progressive disease with appearance of new right lung nodules as well as progressive mediastinal adenopathy.  Also noted to have right pleural effusion which was tapped and was negative for malignancy  Interval history-she has baseline fatigue and exertional shortness of breath with mild to moderate exertion for which she uses as needed oxygen.  Denies any significant cough or expectoration presently  ECOG PS- 2 Pain scale- 0   Review of systems- Review of Systems  Constitutional:  Positive for malaise/fatigue. Negative for chills, fever and weight loss.  HENT:  Negative for congestion, ear discharge and nosebleeds.   Eyes:  Negative for blurred vision.  Respiratory:  Positive for shortness of breath. Negative for cough, hemoptysis, sputum production and wheezing.   Cardiovascular:  Negative for chest pain, palpitations, orthopnea and claudication.  Gastrointestinal:  Negative for abdominal pain, blood in stool, constipation, diarrhea, heartburn, melena, nausea and vomiting.  Genitourinary:  Negative for dysuria, flank pain, frequency, hematuria and urgency.  Musculoskeletal:  Negative for back pain, joint pain and myalgias.  Skin:  Negative for rash.  Neurological:  Negative for dizziness, tingling, focal weakness, seizures, weakness and headaches.  Endo/Heme/Allergies:  Does not bruise/bleed easily.  Psychiatric/Behavioral:  Negative for depression and suicidal ideas. The patient does not have insomnia.       Allergies  Allergen Reactions   Aspirin Hives, Shortness Of Breath and Other (See Comments)    Difficulty breathing   Celebrex [Celecoxib] Shortness Of Breath  Morphine And Related Nausea And Vomiting and Swelling    Adhesive [Tape] Other (See Comments)    Took top layer of skin off when removed.   Clarithromycin Nausea And Vomiting   Codeine Nausea And Vomiting   Darvon [Propoxyphene Hcl] Nausea And Vomiting   Demerol [Meperidine] Nausea And Vomiting   Flonase [Fluticasone Propionate] Other (See Comments)    Fungal infection in nose   Simvastatin Other (See Comments)    "caused ulcers in mouth, and fever"   Talwin [Pentazocine] Nausea And Vomiting     Past Medical History:  Diagnosis Date   Anemia    Asthma    No Inhalers--Dr. Meredeth Ide will order as needed   Bronchiectasis (HCC)    mild   Chronic headaches     followed by [facility] migraines   COPD (chronic obstructive pulmonary disease) (HCC)    DDD (degenerative disc disease), lumbar    Diverticulosis    Family history of adverse reaction to anesthesia    mother and sisters-PONV   Gall stones    history of   GERD (gastroesophageal reflux disease)    EGD 8/09- non bleeding erosive gastritis, documentd esophageal ulcerations.    Hiatal hernia    small   History of kidney stones    History of pneumonia    Hypercholesterolemia    IBS (irritable bowel syndrome)    Malignant neoplasm of upper lobe of left lung (HCC) 12/27/2018   Meniere disease    Murmur    Osteoarthritis    lumbar disc disease, left hip   Personal history of chemotherapy    Personal history of radiation therapy    Pneumonia 12/2020   PONV (postoperative nausea and vomiting)    Sleep apnea    uses cpap   Vertigo    Weakness of right side of body      Past Surgical History:  Procedure Laterality Date   ABDOMINAL HYSTERECTOMY  age 65   ANTERIOR CERVICAL DECOMP/DISCECTOMY FUSION N/A 02/24/2015   Procedure: CERVICAL FOUR-FIVE, CERVICAL FIVE-SIX, CERVICAL SIX-SEVEN ANTERIOR CERVICAL DECOMPRESSION/DISCECTOMY FUSION ;  Surgeon: Lisbeth Renshaw, MD;  Location: [facility];  Service: Neurosurgery;  Laterality: N/A;  C45 C56 C67 anterior cervical decompression  with fusion interbody prosthesis plating and bonegraft   APPENDECTOMY     BACK SURGERY  1988   4th lumbar fusion   BLADDER SURGERY N/A    with vaginal wall repair   BREAST CYST ASPIRATION Bilateral    neg   BREAST SURGERY Bilateral    cyst removed and reduction   CARDIAC CATHETERIZATION  2014   CARPAL TUNNEL RELEASE Right 02/11/2016   Procedure: CARPAL TUNNEL RELEASE;  Surgeon: Kennedy Bucker, MD;  Location: [facility] OR[facility];  Service: Orthopedics;  Laterality: Right;   CATARACT EXTRACTION W/ INTRAOCULAR LENS IMPLANT Bilateral 2015   CHOLECYSTECTOMY     EUS N/A 05/31/2012   Procedure: UPPER ENDOSCOPIC ULTRASOUND (EUS) LINEAR;  Surgeon: Rachael Fee, MD;  Location: [facility];  Service: Endoscopy;  Laterality: N/A;   EXCISIONAL HEMORRHOIDECTOMY     JOINT REPLACEMENT Bilateral    KNEE ARTHROSCOPY WITH LATERAL MENISECTOMY Right 07/07/2015   Procedure: KNEE ARTHROSCOPY WITH LATERAL MENISECTOMY, PARTIAL SYNOVECTOMY;  Surgeon: Kennedy Bucker, MD;  Location: [facility] OR[facility];  Service: Orthopedics;  Laterality: Right;   LUMBAR LAMINECTOMY     PORTA CATH INSERTION N/A 01/07/2019   Procedure: PORTA CATH INSERTION;  Surgeon: Annice Needy, MD;  Location: [facility];  Service: Cardiovascular;  Laterality: N/A;  REDUCTION MAMMAPLASTY  1990   RIGHT OOPHORECTOMY     TOTAL HIP ARTHROPLASTY Left 05/01/2014   Dr. Sheppard Evens   TOTAL HIP ARTHROPLASTY Right 08/05/2014   Procedure: TOTAL HIP ARTHROPLASTY ANTERIOR APPROACH;  Surgeon: Kennedy Bucker, MD;  Location: [facility] OR[facility];  Service: Orthopedics;  Laterality: Right;   ULNAR NERVE TRANSPOSITION Right 02/11/2016   Procedure: ULNAR NERVE DECOMPRESSION/TRANSPOSITION;  Surgeon: Kennedy Bucker, MD;  Location: [facility];  Service: Orthopedics;  Laterality: Right;   VIDEO BRONCHOSCOPY WITH ENDOBRONCHIAL ULTRASOUND Left 12/19/2018   Procedure: VIDEO BRONCHOSCOPY WITH ENDOBRONCHIAL ULTRASOUND, LEFT, SLEEP APNEA;  Surgeon: Vida Rigger, MD;  Location: [facility] OR[facility];   Service: Thoracic;  Laterality: Left;   VIDEO BRONCHOSCOPY WITH ENDOBRONCHIAL ULTRASOUND Right 05/14/2021   Procedure: VIDEO BRONCHOSCOPY WITH ENDOBRONCHIAL ULTRASOUND;  Surgeon: Salena Saner, MD;  Location: [facility] OR[facility];  Service: Pulmonary;  Laterality: Right;    Social History   Socioeconomic History   Marital status: Married    Spouse name: Not on file   Number of children: Not on file   Years of education: Not on file   Highest education level: Not on file  Occupational History   Not on file  Tobacco Use   Smoking status: Former    Packs/day: 1.00    Years: 40.00    Additional pack years: 0.00    Total pack years: 40.00    Types: Cigarettes    Quit date: 06/13/2007    Years since quitting: 15.0   Smokeless tobacco: Never  Vaping Use   Vaping Use: Never used  Substance and Sexual Activity   Alcohol use: No    Alcohol/week: 0.0 standard drinks of alcohol   Drug use: No   Sexual activity: Not on file  Other Topics Concern   Not on file  Social History Narrative   Not on file   Social Determinants of Health   Financial Resource Strain: Not on file  Food Insecurity: No Food Insecurity (04/06/2022)   Hunger Vital Sign    Worried About Running Out of Food in the Last Year: Never true    Ran Out of Food in the Last Year: Never true  Transportation Needs: No Transportation Needs (04/06/2022)   PRAPARE - Administrator, Civil Service (Medical): No    Lack of Transportation (Non-Medical): No  Physical Activity: Not on file  Stress: Not on file  Social Connections: Not on file  Intimate Partner Violence: Not At Risk (04/06/2022)   Humiliation, Afraid, Rape, and Kick questionnaire    Fear of Current or Ex-Partner: No    Emotionally Abused: No    Physically Abused: No    Sexually Abused: No    Family History  Problem Relation Age of Onset   Heart disease Mother        s/p stent   Hypertension Mother    Hypercholesterolemia Mother    Diabetes Father     Stomach cancer Other        uncle   Breast cancer Cousin    Breast cancer Paternal Aunt      Current Outpatient Medications:    acetaminophen (TYLENOL) 500 MG tablet, Take 500 mg by mouth 2 (two) times daily., Disp: , Rfl:    Bioflavonoid Products (ESTER C PO), Take 1 tablet by mouth daily. 500 mg, Disp: , Rfl:    Calcium-Magnesium-Zinc (CAL-MAG-ZINC PO), Take 1 tablet by mouth daily., Disp: , Rfl:    Cholecalciferol (EQL VITAMIN D3) 25 MCG (1000 UT) capsule, Take 1,000  Units by mouth daily., Disp: , Rfl:    Coenzyme Q10 (CO Q 10) 100 MG CAPS, Take 100 mg by mouth daily., Disp: , Rfl:    cyclobenzaprine (FLEXERIL) 10 MG tablet, Take 10 mg by mouth at bedtime., Disp: , Rfl:    DHEA 25 MG CAPS, Take 1 capsule by mouth daily., Disp: , Rfl:    ezetimibe (ZETIA) 10 MG tablet, Take 5 mg by mouth in the morning and at bedtime., Disp: , Rfl:    Histamine Dihydrochloride (AUSTRALIAN DREAM ARTHRITIS) 0.025 % CREA, Apply 1 application. topically 4 (four) times daily as needed (pain)., Disp: , Rfl:    HYDROcodone-acetaminophen (NORCO/VICODIN) 5-325 MG tablet, Take 1 tablet by mouth at bedtime., Disp: 30 tablet, Rfl: 0   loratadine (CLARITIN) 10 MG tablet, Take 10 mg by mouth daily., Disp: , Rfl:    pantoprazole (PROTONIX) 40 MG tablet, Take 40 mg by mouth 2 (two) times daily., Disp: , Rfl:    Polyethyl Glyc-Propyl Glyc PF (SYSTANE HYDRATION PF) 0.4-0.3 % SOLN, Place 1 drop into both eyes as needed. as, Disp: , Rfl:    polyethylene glycol powder (GLYCOLAX/MIRALAX) 17 GM/SCOOP powder, Take 17 g by mouth daily as needed for moderate constipation., Disp: , Rfl:    Sennosides (SENNA) 8.6 MG CAPS, Take 1 capsule by mouth daily as needed (constipation)., Disp: , Rfl:    spironolactone (ALDACTONE) 50 MG tablet, , Disp: , Rfl:    torsemide (DEMADEX) 20 MG tablet, Take 20 mg by mouth once., Disp: , Rfl:    torsemide (DEMADEX) 20 MG tablet, Take by mouth., Disp: , Rfl:    UBRELVY 100 MG TABS, Take 100 mg by mouth  daily as needed (migraines)., Disp: , Rfl:    Wheat Dextrin (EQ FIBER POWDER PO), Take 1 Dose by mouth as needed., Disp: , Rfl:    albuterol (VENTOLIN HFA) 108 (90 Base) MCG/ACT inhaler, Inhale 2 puffs into the lungs every 6 (six) hours as needed for wheezing or shortness of breath. (Patient not taking: Reported on 06/20/2022), Disp: , Rfl:    chlorhexidine (PERIDEX) 0.12 % solution, , Disp: , Rfl:    ondansetron (ZOFRAN) 8 MG tablet, Take 1 tablet (8 mg total) by mouth every 8 (eight) hours as needed for nausea or vomiting. (Patient not taking: Reported on 06/20/2022), Disp: 20 tablet, Rfl: 0   triamcinolone (NASACORT) 55 MCG/ACT AERO nasal inhaler, Place 2 sprays into the nose daily as needed. (Patient not taking: Reported on 06/20/2022), Disp: , Rfl:   Physical exam:  Vitals:   07/11/22 0844  BP: 116/75  Pulse: (!) 102  Resp: 18  Temp: (!) 97.3 F (36.3 C)  TempSrc: Tympanic  SpO2: 100%  Weight: 159 lb 14.4 oz (72.5 kg)  Height: 5\' 1"  (1.549 m)   Physical Exam Cardiovascular:     Rate and Rhythm: Normal rate and regular rhythm.     Heart sounds: Normal heart sounds.  Pulmonary:     Effort: Pulmonary effort is normal.     Comments: Scattered bilateral rhonchi.  Chronic Abdominal:     General: Bowel sounds are normal.     Palpations: Abdomen is soft.  Skin:    General: Skin is warm and dry.  Neurological:     Mental Status: She is alert and oriented to person, place, and time.         Latest Ref Rng & Units 06/20/2022    8:52 AM  CMP  Glucose 70 - 99 mg/dL 93  BUN 8 - 23 mg/dL 14   Creatinine 4.33 - 1.00 mg/dL 2.95   Sodium 188 - 416 mmol/L 137   Potassium 3.5 - 5.1 mmol/L 3.7   Chloride 98 - 111 mmol/L 103   CO2 22 - 32 mmol/L 27   Calcium 8.9 - 10.3 mg/dL 8.7   Total Protein 6.5 - 8.1 g/dL 6.3   Total Bilirubin 0.3 - 1.2 mg/dL 0.5   Alkaline Phos 38 - 126 U/L 77   AST 15 - 41 U/L 18   ALT 0 - 44 U/L 10       Latest Ref Rng & Units 06/20/2022    8:52 AM  CBC  WBC  4.0 - 10.5 K/uL 4.3   Hemoglobin 12.0 - 15.0 g/dL 60.6   Hematocrit 30.1 - 46.0 % 33.5   Platelets 150 - 400 K/uL 258     No images are attached to the encounter.  US THORACENTESIS ASP PLEURAL SPACE W/IMG GUIDE  Result Date: 06/23/2022 INDICATION: Patient with recurrent right pleural effusion and shortness of breath, history of lung cancer. EXAM: ULTRASOUND GUIDED RIGHT THORACENTESIS MEDICATIONS: Local lidocaine 1% only. COMPLICATIONS: None immediate. PROCEDURE: An ultrasound guided thoracentesis was thoroughly discussed with the patient and questions answered. The benefits, risks, alternatives and complications were also discussed. The patient understands and wishes to proceed with the procedure. Written consent was obtained. Ultrasound was performed to localize and mark an adequate pocket of fluid in the right chest. The area was then prepped and draped in the normal sterile fashion. 1% Lidocaine was used for local anesthesia. Under ultrasound guidance a 19 gauge, 7-cm, Yueh catheter was introduced. Thoracentesis was performed. The catheter was removed and a dressing applied. FINDINGS: A total of approximately 650 mL of amber colored fluid was removed. IMPRESSION: Successful ultrasound guided right thoracentesis yielding 650 mL of pleural fluid. This exam was performed by Pattricia Boss PA-C, and was supervised and interpreted by Dr. Elby Showers. Electronically Signed   By: Marliss Coots M.D.   On: 06/23/2022 11:48   DG Chest Port 1 View  Result Date: 06/23/2022 CLINICAL DATA:  Status post right-sided thoracentesis. EXAM: PORTABLE CHEST 1 VIEW COMPARISON:  05/17/2022 FINDINGS: Right chest wall port a catheter is stable with tip in the projection of the distal SVC. Stable cardiomediastinal contours. Masslike architectural distortion, fibrosis and volume loss within the right midlung compatible with post treatment changes. Decreased volume of right pleural effusion status thoracentesis. No pneumothorax.  IMPRESSION: Decrease in volume of right pleural effusion status post thoracentesis. No pneumothorax. Electronically Signed   By: Signa Kell M.D.   On: 06/23/2022 11:09     Assessment and plan- Patient is a 77 y.o. female with history of recurrent locally advanced non-small cell lung cancer adenocarcinoma.  She is here for on treatment assessment prior to next cycle of maintenance Alimta  Alimta was on hold until November 2023 due to stable scans and worsening anemia.  It was restarted in April 2024 due to progressive disease.  This is her second cycle of Alimta today and I will see her back in 3 weeks for cycle 3.  Plan to repeat scans after 4 cycles.  Chemo-induced anemia: I will continue to monitor her hemoglobin closely and have a low threshold to lower the dose of Alimta given that her hemoglobin is gradually drifting down with 2 cycles of Alimta.  Was 12.36 weeks ago and is presently at 10.3   Visit Diagnosis 1. Encounter for antineoplastic chemotherapy   2.  Malignant neoplasm of upper lobe of left lung (HCC)   3. Antineoplastic chemotherapy induced anemia      Dr. Owens Shark, MD, MPH [facility] [phone] 07/11/2022 8:58 AM

## 2022-07-11 NOTE — Patient Instructions (Signed)

## 2022-07-18 ENCOUNTER — Ambulatory Visit
Admission: RE | Admit: 2022-07-18 | Discharge: 2022-07-18 | Disposition: A | Discharge status: Home / Self Care | Payer: Medicare Other | Source: Ambulatory Visit | Attending: Oncology | Admitting: Oncology

## 2022-07-18 ENCOUNTER — Other Ambulatory Visit: Payer: Self-pay | Admitting: *Deleted

## 2022-07-18 ENCOUNTER — Inpatient Hospital Stay: Payer: Medicare Other | Attending: Oncology | Admitting: Hospice and Palliative Medicine

## 2022-07-18 ENCOUNTER — Encounter: Payer: Self-pay | Admitting: Oncology

## 2022-07-18 ENCOUNTER — Inpatient Hospital Stay: Payer: Medicare Other

## 2022-07-18 ENCOUNTER — Telehealth: Payer: Self-pay | Admitting: *Deleted

## 2022-07-18 ENCOUNTER — Encounter: Payer: Self-pay | Admitting: Hospice and Palliative Medicine

## 2022-07-18 DIAGNOSIS — C3412 Malignant neoplasm of upper lobe, left bronchus or lung: Secondary | ICD-10-CM

## 2022-07-18 DIAGNOSIS — R0602 Shortness of breath: Secondary | ICD-10-CM | POA: Insufficient documentation

## 2022-07-18 DIAGNOSIS — D6481 Anemia due to antineoplastic chemotherapy: Secondary | ICD-10-CM | POA: Insufficient documentation

## 2022-07-18 DIAGNOSIS — D649 Anemia, unspecified: Secondary | ICD-10-CM

## 2022-07-18 DIAGNOSIS — J90 Pleural effusion, not elsewhere classified: Secondary | ICD-10-CM

## 2022-07-18 DIAGNOSIS — Z5111 Encounter for antineoplastic chemotherapy: Secondary | ICD-10-CM | POA: Insufficient documentation

## 2022-07-18 DIAGNOSIS — R110 Nausea: Secondary | ICD-10-CM | POA: Insufficient documentation

## 2022-07-18 LAB — CMP (CANCER CENTER ONLY)
ALT: 15 U/L (ref 0–44)
AST: 23 U/L (ref 15–41)
Albumin: 3.6 g/dL (ref 3.5–5.0)
Alkaline Phosphatase: 77 U/L (ref 38–126)
Anion gap: 16 — ABNORMAL HIGH (ref 5–15)
BUN: 23 mg/dL (ref 8–23)
CO2: 30 mmol/L (ref 22–32)
Calcium: 9.3 mg/dL (ref 8.9–10.3)
Chloride: 88 mmol/L — ABNORMAL LOW (ref 98–111)
Creatinine: 0.66 mg/dL (ref 0.44–1.00)
GFR, Estimated: >60 mL/min (ref >60)
Glucose, Bld: 116 mg/dL — ABNORMAL HIGH (ref 70–99)
Potassium: 3.5 mmol/L (ref 3.5–5.1)
Sodium: 134 mmol/L — ABNORMAL LOW (ref 135–145)
Total Bilirubin: 0.8 mg/dL (ref 0.3–1.2)
Total Protein: 7.1 g/dL (ref 6.5–8.1)

## 2022-07-18 LAB — CBC WITH DIFFERENTIAL (CANCER CENTER ONLY)
Abs Immature Granulocytes: 0.01 10*3/uL (ref 0.00–0.07)
Basophils Absolute: 0 10*3/uL (ref 0.0–0.1)
Basophils Relative: 1 %
Eosinophils Absolute: 0 10*3/uL (ref 0.0–0.5)
Eosinophils Relative: 1 %
HCT: 32.5 % — ABNORMAL LOW (ref 36.0–46.0)
Hemoglobin: 11 g/dL — ABNORMAL LOW (ref 12.0–15.0)
Immature Granulocytes: 1 %
Lymphocytes Relative: 27 %
Lymphs Abs: 0.6 10*3/uL — ABNORMAL LOW (ref 0.7–4.0)
MCH: 29.5 pg (ref 26.0–34.0)
MCHC: 33.8 g/dL (ref 30.0–36.0)
MCV: 87.1 fL (ref 80.0–100.0)
Monocytes Absolute: 0.5 10*3/uL (ref 0.1–1.0)
Monocytes Relative: 22 %
Neutro Abs: 1 10*3/uL — ABNORMAL LOW (ref 1.7–7.7)
Neutrophils Relative %: 48 %
Platelet Count: 246 10*3/uL (ref 150–400)
RBC: 3.73 MIL/uL — ABNORMAL LOW (ref 3.87–5.11)
RDW: 13.4 % (ref 11.5–15.5)
WBC Count: 2.1 10*3/uL — ABNORMAL LOW (ref 4.0–10.5)
nRBC: 0 % (ref 0.0–0.2)

## 2022-07-18 MED ORDER — HEPARIN SOD (PORK) LOCK FLUSH 100 UNIT/ML IV SOLN
500.0000 [IU] | Freq: Once | INTRAVENOUS | Status: AC
  Administered 2022-07-18: 500 [IU] via INTRAVENOUS
  Filled 2022-07-18: qty 5

## 2022-07-18 MED ORDER — SODIUM CHLORIDE 0.9 % IV SOLN
INTRAVENOUS | Status: DC
  Filled 2022-07-18 (×2): qty 250

## 2022-07-18 MED ORDER — SODIUM CHLORIDE 0.9% FLUSH
10.0000 mL | Freq: Once | INTRAVENOUS | Status: AC
  Administered 2022-07-18: 10 mL via INTRAVENOUS
  Filled 2022-07-18: qty 10

## 2022-07-18 MED ORDER — ONDANSETRON HCL 4 MG/2ML IJ SOLN
8.0000 mg | Freq: Once | INTRAMUSCULAR | Status: AC
  Administered 2022-07-18: 8 mg via INTRAVENOUS
  Filled 2022-07-18: qty 4

## 2022-07-18 NOTE — Telephone Encounter (Signed)
Pt called and said that she is not feeling good with breathing, thinks she may have fluid build up.I called the pt and let her know that [facility]30 and then 11 am  to see [facility] cancer center. Pt is ok with this .

## 2022-07-18 NOTE — Progress Notes (Signed)
Pt c/o shortness of breath, C/o episodes of dizziness/intermittent headaches, c/o constipation (small stools), dyphagia with solids/lqd. She reports that she ate a sandwich last thurs from [employer] and has felt like she had indigestion since. She c/o left groin pain - acute x 2-3 days. "feels like a muscle cramp."

## 2022-07-18 NOTE — Progress Notes (Signed)
[facility] at [facility] [phone] [phone]  Patient Care Team: Marina Goodell, MD as PCP - General (Family Medicine) Glory Buff, RN as Registered Nurse Carmina Miller, MD as Referring Physician (Radiation Oncology) Creig Hines, MD as Consulting Physician (Oncology)   NAME OF PATIENT: Jennifer Hodges  [id number]  07-16-1945   DATE OF VISIT: 07/18/22  REASON FOR CONSULT: ALYSSON RESSLER is a 77 y.o. 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  Patient on maintenance Alimta until November 2023 when treatment was held due to worsening anemia.  He was restarted in April 2024 due to progressive disease.  INTERVAL HISTORY: Patient saw Dr. Smith Robert on 07/11/2022 at which time she received cycle 2 Alimta.  Patient presents [facility] today with a myriad of complaints including fatigue, constipation, nausea, and poor oral intake, which patient states started at time of the last chemotherapy.  Additionally, patient has had some exertional dyspnea but describes it unchanged in characteristic.  She denies chest pain or pressure.  She does have a history of requiring thoracentesis.  Denies any neurologic complaints. Denies recent fevers or illnesses. Denies any easy bleeding or bruising. Reports fair appetite and denies weight loss. Denies chest pain.  Denies urinary complaints. Patient offers no further specific complaints today.   PAST MEDICAL HISTORY: Past Medical History:  Diagnosis Date   Anemia    Asthma    No Inhalers--Dr. Meredeth Ide will order as needed   Bronchiectasis (HCC)    mild   Chronic headaches     followed by [facility] migraines   COPD (chronic obstructive pulmonary disease) (HCC)    DDD (degenerative disc disease), lumbar    Diverticulosis    Family history of adverse reaction to  anesthesia    mother and sisters-PONV   Gall stones    history of   GERD (gastroesophageal reflux disease)    EGD 8/09- non bleeding erosive gastritis, documentd esophageal ulcerations.    Hiatal hernia    small   History of kidney stones    History of pneumonia    Hypercholesterolemia    IBS (irritable bowel syndrome)    Malignant neoplasm of upper lobe of left lung (HCC) 12/27/2018   Meniere disease    Murmur    Osteoarthritis    lumbar disc disease, left hip   Personal history of chemotherapy    Personal history of radiation therapy    Pneumonia 12/2020   PONV (postoperative nausea and vomiting)    Sleep apnea    uses cpap   Vertigo    Weakness of right side of body     PAST SURGICAL HISTORY:  Past Surgical History:  Procedure Laterality Date   ABDOMINAL HYSTERECTOMY  age 71   ANTERIOR CERVICAL DECOMP/DISCECTOMY FUSION N/A 02/24/2015   Procedure: CERVICAL FOUR-FIVE, CERVICAL FIVE-SIX, CERVICAL SIX-SEVEN ANTERIOR CERVICAL DECOMPRESSION/DISCECTOMY FUSION ;  Surgeon: Lisbeth Renshaw, MD;  Location: [facility];  Service: Neurosurgery;  Laterality: N/A;  C45 C56 C67 anterior cervical decompression with fusion interbody prosthesis plating and bonegraft   APPENDECTOMY     BACK SURGERY  1988   4th lumbar fusion   BLADDER SURGERY N/A    with vaginal wall repair   BREAST CYST ASPIRATION Bilateral    neg   BREAST SURGERY Bilateral    cyst removed and reduction   CARDIAC CATHETERIZATION  2014   CARPAL  TUNNEL RELEASE Right 02/11/2016   Procedure: CARPAL TUNNEL RELEASE;  Surgeon: Kennedy Bucker, MD;  Location: [facility] ORS;  Service: Orthopedics;  Laterality: Right;   CATARACT EXTRACTION W/ INTRAOCULAR LENS IMPLANT Bilateral 2015   CHOLECYSTECTOMY     EUS N/A 05/31/2012   Procedure: UPPER ENDOSCOPIC ULTRASOUND (EUS) LINEAR;  Surgeon: Rachael Fee, MD;  Location: [facility];  Service: Endoscopy;  Laterality: N/A;   EXCISIONAL HEMORRHOIDECTOMY     JOINT REPLACEMENT Bilateral     KNEE ARTHROSCOPY WITH LATERAL MENISECTOMY Right 07/07/2015   Procedure: KNEE ARTHROSCOPY WITH LATERAL MENISECTOMY, PARTIAL SYNOVECTOMY;  Surgeon: Kennedy Bucker, MD;  Location: [facility] ORS;  Service: Orthopedics;  Laterality: Right;   LUMBAR LAMINECTOMY     PORTA CATH INSERTION N/A 01/07/2019   Procedure: PORTA CATH INSERTION;  Surgeon: Annice Needy, MD;  Location: [facility] INV[facility] CV [facility];  Service: Cardiovascular;  Laterality: N/A;   REDUCTION MAMMAPLASTY  1990   RIGHT OOPHORECTOMY     TOTAL HIP ARTHROPLASTY Left 05/01/2014   Dr. Sheppard Evens   TOTAL HIP ARTHROPLASTY Right 08/05/2014   Procedure: TOTAL HIP ARTHROPLASTY ANTERIOR APPROACH;  Surgeon: Kennedy Bucker, MD;  Location: [facility] ORS;  Service: Orthopedics;  Laterality: Right;   ULNAR NERVE TRANSPOSITION Right 02/11/2016   Procedure: ULNAR NERVE DECOMPRESSION/TRANSPOSITION;  Surgeon: Kennedy Bucker, MD;  Location: [facility] ORS;  Service: Orthopedics;  Laterality: Right;   VIDEO BRONCHOSCOPY WITH ENDOBRONCHIAL ULTRASOUND Left 12/19/2018   Procedure: VIDEO BRONCHOSCOPY WITH ENDOBRONCHIAL ULTRASOUND, LEFT, SLEEP APNEA;  Surgeon: Vida Rigger, MD;  Location: [facility] ORS;  Service: Thoracic;  Laterality: Left;   VIDEO BRONCHOSCOPY WITH ENDOBRONCHIAL ULTRASOUND Right 05/14/2021   Procedure: VIDEO BRONCHOSCOPY WITH ENDOBRONCHIAL ULTRASOUND;  Surgeon: Salena Saner, MD;  Location: [facility] ORS;  Service: Pulmonary;  Laterality: Right;    HEMATOLOGY/ONCOLOGY HISTORY:  Oncology History Overview Note  #October 2020-stage III lung cancer-chemoradiation; currently on maintenance durvalumab.   Malignant neoplasm of upper lobe of left lung (HCC)  12/27/2018 Initial Diagnosis   Malignant neoplasm of upper lobe of left lung (HCC)   12/27/2018 Cancer Staging   Staging form: Lung, AJCC 8th Edition - Clinical stage from 12/27/2018: Stage IIIA (cT1b, cN2, cM0) - Signed by Creig Hines, MD on 12/27/2018   01/14/2019 - 03/12/2019 Chemotherapy   The patient had  dexamethasone (DECADRON) 4 MG tablet, 8 mg, Oral, Daily, 1 of 1 cycle, Start date: 12/28/2018, End date: 03/22/2019 palonosetron (ALOXI) injection 0.25 mg, 0.25 mg, Intravenous,  Once, 7 of 7 cycles Administration: 0.25 mg (01/14/2019), 0.25 mg (01/21/2019), 0.25 mg (01/28/2019), 0.25 mg (02/04/2019), 0.25 mg (02/12/2019), 0.25 mg (02/19/2019), 0.25 mg (03/12/2019) CARBOplatin (PARAPLATIN) 170 mg in sodium chloride 0.9 % 100 mL chemo infusion, 170 mg (100 % of original dose 171.2 mg), Intravenous,  Once, 7 of 7 cycles Dose modification:   (original dose 171.2 mg, Cycle 1) Administration: 170 mg (01/14/2019), 170 mg (01/21/2019), 170 mg (01/28/2019), 170 mg (02/04/2019), 170 mg (02/12/2019), 170 mg (02/19/2019), 130 mg (03/12/2019) PACLitaxel (TAXOL) 84 mg in sodium chloride 0.9 % 250 mL chemo infusion (</= 80mg /m2), 45 mg/m2 = 84 mg, Intravenous,  Once, 7 of 7 cycles Administration: 84 mg (01/14/2019), 84 mg (01/21/2019), 84 mg (01/28/2019), 84 mg (02/04/2019), 84 mg (02/12/2019), 84 mg (02/19/2019), 84 mg (03/12/2019)  for chemotherapy treatment.    04/04/2019 - 04/30/2020 Chemotherapy   Patient is on Treatment Plan : LUNG DURVALUMAB Q14D     05/26/2021 - 10/27/2021 Chemotherapy   Patient is on Treatment Plan : LUNG NSCLC Pemetrexed +  Carboplatin q21d x 4 Cycles     11/14/2021 -  Chemotherapy   Patient is on Treatment Plan : LUNG NSCLC Pemetrexed + Carboplatin q21d x 3 Cycles       ALLERGIES:  is allergic to aspirin, celebrex [celecoxib], morphine and related, adhesive [tape], clarithromycin, codeine, darvon [propoxyphene hcl], demerol [meperidine], flonase [fluticasone propionate], simvastatin, and talwin [pentazocine].  MEDICATIONS:  Current Outpatient Medications  Medication Sig Dispense Refill   acetaminophen (TYLENOL) 500 MG tablet Take 500 mg by mouth 2 (two) times daily.     cyclobenzaprine (FLEXERIL) 10 MG tablet Take 10 mg by mouth at bedtime.     ezetimibe (ZETIA) 10 MG tablet Take 5 mg by mouth  in the morning and at bedtime.     HYDROcodone-acetaminophen (NORCO/VICODIN) 5-325 MG tablet Take 1 tablet by mouth at bedtime. 30 tablet 0   loratadine (CLARITIN) 10 MG tablet Take 10 mg by mouth daily.     ondansetron (ZOFRAN) 8 MG tablet Take 1 tablet (8 mg total) by mouth every 8 (eight) hours as needed for nausea or vomiting. 20 tablet 0   pantoprazole (PROTONIX) 40 MG tablet Take 40 mg by mouth 2 (two) times daily.     Polyethyl Glyc-Propyl Glyc PF (SYSTANE HYDRATION PF) 0.4-0.3 % SOLN Place 1 drop into both eyes as needed. as     polyethylene glycol powder (GLYCOLAX/MIRALAX) 17 GM/SCOOP powder Take 17 g by mouth daily as needed for moderate constipation.     spironolactone (ALDACTONE) 50 MG tablet Take 50 mg by mouth 2 (two) times daily.     torsemide (DEMADEX) 20 MG tablet Take 40 mg by mouth once.     UBRELVY 100 MG TABS Take 100 mg by mouth daily as needed (migraines).     Wheat Dextrin (EQ FIBER POWDER PO) Take 1 Dose by mouth as needed.     albuterol (VENTOLIN HFA) 108 (90 Base) MCG/ACT inhaler Inhale 2 puffs into the lungs every 6 (six) hours as needed for wheezing or shortness of breath. (Patient not taking: Reported on 06/20/2022)     Bioflavonoid Products (ESTER C PO) Take 1 tablet by mouth daily. 500 mg (Patient not taking: Reported on 07/18/2022)     Calcium-Magnesium-Zinc (CAL-MAG-ZINC PO) Take 1 tablet by mouth daily. (Patient not taking: Reported on 07/18/2022)     chlorhexidine (PERIDEX) 0.12 % solution  (Patient not taking: Reported on 06/20/2022)     Cholecalciferol (EQL VITAMIN D3) 25 MCG (1000 UT) capsule Take 1,000 Units by mouth daily. (Patient not taking: Reported on 07/18/2022)     Coenzyme Q10 (CO Q 10) 100 MG CAPS Take 100 mg by mouth daily. (Patient not taking: Reported on 07/18/2022)     DHEA 25 MG CAPS Take 1 capsule by mouth daily. (Patient not taking: Reported on 07/18/2022)     Histamine Dihydrochloride (AUSTRALIAN DREAM ARTHRITIS) 0.025 % CREA Apply 1 application. topically  4 (four) times daily as needed (pain). (Patient not taking: Reported on 07/18/2022)     Sennosides (SENNA) 8.6 MG CAPS Take 1 capsule by mouth daily as needed (constipation). (Patient not taking: Reported on 07/18/2022)     triamcinolone (NASACORT) 55 MCG/ACT AERO nasal inhaler Place 2 sprays into the nose daily as needed. (Patient not taking: Reported on 06/20/2022)     Current Facility-Administered Medications  Medication Dose Route Frequency Provider Last Rate Last Admin   heparin lock flush 100 unit/mL  500 Units Intravenous Once Isha Seefeld, Daryl Eastern, NP       Facility-Administered  Medications Ordered in Other Visits  Medication Dose Route Frequency Provider Last Rate Last Admin   0.9 %  sodium chloride infusion   Intravenous Continuous Melvinia Ashby, Daryl Eastern, NP 500 mL/hr at 07/18/22 1209 New Bag at 07/18/22 1209    VITAL SIGNS: BP 122/80   Pulse (!) 118   Temp 97.9 F (36.6 C) (Tympanic)   Resp 18   Ht 5\' 1"  (1.549 m)   Wt 155 lb (70.3 kg)   SpO2 97% Comment: room air  BMI 29.29 kg/m  Filed Weights   07/18/22 1100  Weight: 155 lb (70.3 kg)    Estimated body mass index is 29.29 kg/m as calculated from the following:   Height as of this encounter: 5\' 1"  (1.549 m).   Weight as of this encounter: 155 lb (70.3 kg).  LABS: CBC:    Component Value Date/Time   WBC 2.1 (L) 07/18/2022 1154   WBC 4.4 07/11/2022 0836   HGB 11.0 (L) 07/18/2022 1154   HGB 12.2 09/22/2011 0018   HCT 32.5 (L) 07/18/2022 1154   HCT 37.0 09/22/2011 0018   PLT 246 07/18/2022 1154   PLT 241 09/22/2011 0018   MCV 87.1 07/18/2022 1154   MCV 93 09/22/2011 0018   NEUTROABS 1.0 (L) 07/18/2022 1154   LYMPHSABS 0.6 (L) 07/18/2022 1154   MONOABS 0.5 07/18/2022 1154   EOSABS 0.0 07/18/2022 1154   BASOSABS 0.0 07/18/2022 1154   Comprehensive Metabolic Panel:    Component Value Date/Time   NA 134 (L) 07/18/2022 1154   NA 144 09/22/2011 0018   K 3.5 07/18/2022 1154   K 4.7 09/22/2011 0018   CL 88 (L) 07/18/2022  1154   CL 106 09/22/2011 0018   CO2 30 07/18/2022 1154   CO2 29 09/22/2011 0018   BUN 23 07/18/2022 1154   BUN 20 (H) 09/22/2011 0018   CREATININE 0.66 07/18/2022 1154   CREATININE 0.62 09/22/2011 0018   GLUCOSE 116 (H) 07/18/2022 1154   GLUCOSE 105 (H) 09/22/2011 0018   CALCIUM 9.3 07/18/2022 1154   CALCIUM 9.2 09/22/2011 0018   AST 23 07/18/2022 1154   ALT 15 07/18/2022 1154   ALT 20 09/22/2011 0018   ALKPHOS 77 07/18/2022 1154   ALKPHOS 103 09/22/2011 0018   BILITOT 0.8 07/18/2022 1154   PROT 7.1 07/18/2022 1154   PROT 6.4 09/22/2011 0018   ALBUMIN 3.6 07/18/2022 1154   ALBUMIN 3.8 09/22/2011 0018    RADIOGRAPHIC STUDIES: DG Chest 2 View  Result Date: 07/18/2022 CLINICAL DATA:  shortness of breath; pleural effusion EXAM: CHEST - 2 VIEW COMPARISON:  06/23/2022 FINDINGS: Chest port catheter tip overlies the distal SVC. Unchanged cardiomediastinal silhouette. There is a persistent small right pleural effusion with adjacent masslike architectural distortion, fibrosis, and volume loss, similar to prior exam. Trace left pleural effusion, similar to prior, with adjacent left basilar atelectasis/scarring. No new airspace disease. No evidence of pneumothorax. No acute osseous abnormality. Partially visualized cervical spine fusion hardware. IMPRESSION: Unchanged small right pleural effusion and adjacent posttreatment pulmonary parenchymal changes. Unchanged trace left pleural effusion. No new airspace disease. Electronically Signed   By: Caprice Renshaw M.D.   On: 07/18/2022 11:04   US THORACENTESIS ASP PLEURAL SPACE W/IMG GUIDE  Result Date: 06/23/2022 INDICATION: Patient with recurrent right pleural effusion and shortness of breath, history of lung cancer. EXAM: ULTRASOUND GUIDED RIGHT THORACENTESIS MEDICATIONS: Local lidocaine 1% only. COMPLICATIONS: None immediate. PROCEDURE: An ultrasound guided thoracentesis was thoroughly discussed with the patient and questions answered. The  benefits,  risks, alternatives and complications were also discussed. The patient understands and wishes to proceed with the procedure. Written consent was obtained. Ultrasound was performed to localize and mark an adequate pocket of fluid in the right chest. The area was then prepped and draped in the normal sterile fashion. 1% Lidocaine was used for local anesthesia. Under ultrasound guidance a 19 gauge, 7-cm, Yueh catheter was introduced. Thoracentesis was performed. The catheter was removed and a dressing applied. FINDINGS: A total of approximately 650 mL of amber colored fluid was removed. IMPRESSION: Successful ultrasound guided right thoracentesis yielding 650 mL of pleural fluid. This exam was performed by Pattricia Boss PA-C, and was supervised and interpreted by Dr. Elby Showers. Electronically Signed   By: Marliss Coots M.D.   On: 06/23/2022 11:48   DG Chest Port 1 View  Result Date: 06/23/2022 CLINICAL DATA:  Status post right-sided thoracentesis. EXAM: PORTABLE CHEST 1 VIEW COMPARISON:  05/17/2022 FINDINGS: Right chest wall port a catheter is stable with tip in the projection of the distal SVC. Stable cardiomediastinal contours. Masslike architectural distortion, fibrosis and volume loss within the right midlung compatible with post treatment changes. Decreased volume of right pleural effusion status thoracentesis. No pneumothorax. IMPRESSION: Decrease in volume of right pleural effusion status post thoracentesis. No pneumothorax. Electronically Signed   By: Signa Kell M.D.   On: 06/23/2022 11:09    PERFORMANCE STATUS (ECOG) : 1 - Symptomatic but completely ambulatory  Review of Systems Unless otherwise noted, a complete review of systems is negative.  Physical Exam General: NAD Cardiovascular: regular rate and rhythm Pulmonary: clear ant fields Abdomen: soft, nontender, + bowel sounds GU: no suprapubic tenderness Extremities: no edema, no joint deformities Skin: no rashes Neurological: Weakness  but otherwise nonfocal  IMPRESSION/PLAN: Recurrent locally advanced non-small cell lung cancer -on Alimta  Nausea/Fatigue -symptoms likely secondary to chemotherapy.  Patient nontoxic appearing and labs grossly unchanged from baseline.  Will proceed with IV fluids and supportive care.  Patient instructed on use of her antiemetics.    Constipation -patient is currently on MiraLAX.  Recommended that she add daily senna with target of 1 soft bowel movement daily or every other day.  Shortness of breath -this sounds unchanged in characteristic or severity.  Likely secondary to underlying malignancy.  Patient sent for chest x-ray today with small/unchanged right pleural effusion and posttreatment pulmonary parenchymal changes.  Low suspicion for PE.  Could consider CTA if symptoms worsen.  Case and plan discussed with Dr. Smith Robert.  Will bring patient back next week for follow-up supportive care.  Patient will then see Dr. Smith Robert the following.  Patient expressed understanding and was in agreement with this plan. She also understands that She can call clinic at any time with any questions, concerns, or complaints.   Thank you for allowing me to participate in the care of this very pleasant patient.   Time Total: 20 minutes  Visit consisted of counseling and education dealing with the complex and emotionally intense issues of symptom management in the setting of serious illness.Greater than 50%  of this time was spent counseling and coordinating care related to the above assessment and plan.  Signed by: Laurette Schimke, PhD, NP-C

## 2022-07-25 ENCOUNTER — Inpatient Hospital Stay: Payer: Medicare Other

## 2022-07-25 ENCOUNTER — Other Ambulatory Visit: Payer: Self-pay | Admitting: *Deleted

## 2022-07-25 DIAGNOSIS — C3412 Malignant neoplasm of upper lobe, left bronchus or lung: Secondary | ICD-10-CM

## 2022-07-25 DIAGNOSIS — Z5111 Encounter for antineoplastic chemotherapy: Secondary | ICD-10-CM | POA: Diagnosis not present

## 2022-07-25 LAB — CMP (CANCER CENTER ONLY)
ALT: 13 U/L (ref 0–44)
AST: 26 U/L (ref 15–41)
Albumin: 3.3 g/dL — ABNORMAL LOW (ref 3.5–5.0)
Alkaline Phosphatase: 81 U/L (ref 38–126)
Anion gap: 13 (ref 5–15)
BUN: 14 mg/dL (ref 8–23)
CO2: 30 mmol/L (ref 22–32)
Calcium: 8.9 mg/dL (ref 8.9–10.3)
Chloride: 92 mmol/L — ABNORMAL LOW (ref 98–111)
Creatinine: 0.62 mg/dL (ref 0.44–1.00)
GFR, Estimated: >60 mL/min (ref >60)
Glucose, Bld: 120 mg/dL — ABNORMAL HIGH (ref 70–99)
Potassium: 3.2 mmol/L — ABNORMAL LOW (ref 3.5–5.1)
Sodium: 135 mmol/L (ref 135–145)
Total Bilirubin: 0.4 mg/dL (ref 0.3–1.2)
Total Protein: 6.4 g/dL — ABNORMAL LOW (ref 6.5–8.1)

## 2022-07-25 LAB — CBC WITH DIFFERENTIAL (CANCER CENTER ONLY)
Abs Immature Granulocytes: 0.02 10*3/uL (ref 0.00–0.07)
Basophils Absolute: 0 10*3/uL (ref 0.0–0.1)
Basophils Relative: 0 %
Eosinophils Absolute: 0.1 10*3/uL (ref 0.0–0.5)
Eosinophils Relative: 1 %
HCT: 29.1 % — ABNORMAL LOW (ref 36.0–46.0)
Hemoglobin: 9.6 g/dL — ABNORMAL LOW (ref 12.0–15.0)
Immature Granulocytes: 0 %
Lymphocytes Relative: 11 %
Lymphs Abs: 0.6 10*3/uL — ABNORMAL LOW (ref 0.7–4.0)
MCH: 29.8 pg (ref 26.0–34.0)
MCHC: 33 g/dL (ref 30.0–36.0)
MCV: 90.4 fL (ref 80.0–100.0)
Monocytes Absolute: 0.6 10*3/uL (ref 0.1–1.0)
Monocytes Relative: 13 %
Neutro Abs: 3.6 10*3/uL (ref 1.7–7.7)
Neutrophils Relative %: 75 %
Platelet Count: 228 10*3/uL (ref 150–400)
RBC: 3.22 MIL/uL — ABNORMAL LOW (ref 3.87–5.11)
RDW: 14.7 % (ref 11.5–15.5)
WBC Count: 4.9 10*3/uL (ref 4.0–10.5)
nRBC: 0 % (ref 0.0–0.2)

## 2022-07-25 MED ORDER — HEPARIN SOD (PORK) LOCK FLUSH 100 UNIT/ML IV SOLN
500.0000 [IU] | Freq: Once | INTRAVENOUS | Status: AC
  Administered 2022-07-25: 500 [IU] via INTRAVENOUS
  Filled 2022-07-25: qty 5

## 2022-07-25 MED ORDER — POTASSIUM CHLORIDE 20 MEQ/100ML IV SOLN
20.0000 meq | Freq: Once | INTRAVENOUS | Status: AC
  Administered 2022-07-25: 20 meq via INTRAVENOUS

## 2022-07-25 MED ORDER — SODIUM CHLORIDE 0.9% FLUSH
10.0000 mL | Freq: Once | INTRAVENOUS | Status: AC
  Administered 2022-07-25: 10 mL via INTRAVENOUS
  Filled 2022-07-25: qty 10

## 2022-07-25 MED ORDER — SODIUM CHLORIDE 0.9 % IV SOLN
INTRAVENOUS | Status: DC
  Filled 2022-07-25 (×2): qty 250

## 2022-07-25 MED ORDER — POTASSIUM CHLORIDE CRYS ER 20 MEQ PO TBCR: 20.0000 meq | EXTENDED_RELEASE_TABLET | Freq: Every day | ORAL | 0 refills | Status: DC

## 2022-07-25 NOTE — Progress Notes (Signed)
K 3.2 today. Per Elouise Munroe NP give patient 20 mEq of K today in clinic IV. Script to be sent in for oral potassium for pt to take for one week. Pt aware.

## 2022-07-29 MED FILL — Dexamethasone Sodium Phosphate Inj 100 MG/10ML: INTRAMUSCULAR | Qty: 1 | Status: AC

## 2022-08-01 ENCOUNTER — Encounter: Payer: Self-pay | Admitting: Oncology

## 2022-08-01 ENCOUNTER — Inpatient Hospital Stay (HOSPITAL_BASED_OUTPATIENT_CLINIC_OR_DEPARTMENT_OTHER): Payer: Medicare Other | Admitting: Oncology

## 2022-08-01 ENCOUNTER — Inpatient Hospital Stay: Payer: Medicare Other

## 2022-08-01 VITALS — BP 106/64 | HR 106 | Temp 96.7°F | Resp 18 | Ht 61.0 in | Wt 160.4 lb

## 2022-08-01 DIAGNOSIS — C3412 Malignant neoplasm of upper lobe, left bronchus or lung: Secondary | ICD-10-CM

## 2022-08-01 DIAGNOSIS — D649 Anemia, unspecified: Secondary | ICD-10-CM

## 2022-08-01 DIAGNOSIS — Z5111 Encounter for antineoplastic chemotherapy: Secondary | ICD-10-CM | POA: Diagnosis not present

## 2022-08-01 LAB — CBC WITH DIFFERENTIAL/PLATELET
Abs Immature Granulocytes: 0.01 10*3/uL (ref 0.00–0.07)
Basophils Absolute: 0 10*3/uL (ref 0.0–0.1)
Basophils Relative: 0 %
Eosinophils Absolute: 0.1 10*3/uL (ref 0.0–0.5)
Eosinophils Relative: 1 %
HCT: 30.5 % — ABNORMAL LOW (ref 36.0–46.0)
Hemoglobin: 9.9 g/dL — ABNORMAL LOW (ref 12.0–15.0)
Immature Granulocytes: 0 %
Lymphocytes Relative: 13 %
Lymphs Abs: 0.7 10*3/uL (ref 0.7–4.0)
MCH: 30.2 pg (ref 26.0–34.0)
MCHC: 32.5 g/dL (ref 30.0–36.0)
MCV: 93 fL (ref 80.0–100.0)
Monocytes Absolute: 0.5 10*3/uL (ref 0.1–1.0)
Monocytes Relative: 9 %
Neutro Abs: 4.1 10*3/uL (ref 1.7–7.7)
Neutrophils Relative %: 77 %
Platelets: 433 10*3/uL — ABNORMAL HIGH (ref 150–400)
RBC: 3.28 MIL/uL — ABNORMAL LOW (ref 3.87–5.11)
RDW: 15.8 % — ABNORMAL HIGH (ref 11.5–15.5)
WBC: 5.3 10*3/uL (ref 4.0–10.5)
nRBC: 0 % (ref 0.0–0.2)

## 2022-08-01 LAB — COMPREHENSIVE METABOLIC PANEL
ALT: 12 U/L (ref 0–44)
AST: 20 U/L (ref 15–41)
Albumin: 3.2 g/dL — ABNORMAL LOW (ref 3.5–5.0)
Alkaline Phosphatase: 78 U/L (ref 38–126)
Anion gap: 13 (ref 5–15)
BUN: 13 mg/dL (ref 8–23)
CO2: 22 mmol/L (ref 22–32)
Calcium: 8.6 mg/dL — ABNORMAL LOW (ref 8.9–10.3)
Chloride: 101 mmol/L (ref 98–111)
Creatinine, Ser: 0.45 mg/dL (ref 0.44–1.00)
GFR, Estimated: >60 mL/min (ref >60)
Glucose, Bld: 106 mg/dL — ABNORMAL HIGH (ref 70–99)
Potassium: 4.1 mmol/L (ref 3.5–5.1)
Sodium: 136 mmol/L (ref 135–145)
Total Bilirubin: 0.3 mg/dL (ref 0.3–1.2)
Total Protein: 6.3 g/dL — ABNORMAL LOW (ref 6.5–8.1)

## 2022-08-01 MED ORDER — SODIUM CHLORIDE 0.9 % IV SOLN
10.0000 mg | Freq: Once | INTRAVENOUS | Status: AC
  Administered 2022-08-01: 10 mg via INTRAVENOUS
  Filled 2022-08-01: qty 10

## 2022-08-01 MED ORDER — SODIUM CHLORIDE 0.9 % IV SOLN
Freq: Once | INTRAVENOUS | Status: AC
  Filled 2022-08-01: qty 250

## 2022-08-01 MED ORDER — SODIUM CHLORIDE 0.9 % IV SOLN
500.0000 mg/m2 | Freq: Once | INTRAVENOUS | Status: AC
  Administered 2022-08-01: 900 mg via INTRAVENOUS
  Filled 2022-08-01: qty 36

## 2022-08-01 MED ORDER — HEPARIN SOD (PORK) LOCK FLUSH 100 UNIT/ML IV SOLN
500.0000 [IU] | Freq: Once | INTRAVENOUS | Status: AC | PRN
  Administered 2022-08-01: 500 [IU]
  Filled 2022-08-01: qty 5

## 2022-08-01 MED ORDER — CYANOCOBALAMIN 1000 MCG/ML IJ SOLN
1000.0000 ug | Freq: Once | INTRAMUSCULAR | Status: AC
  Administered 2022-08-01: 1000 ug via INTRAMUSCULAR
  Filled 2022-08-01: qty 1

## 2022-08-01 NOTE — Progress Notes (Signed)
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  EGFR and Ros as well as MET testing negative. PD-L1 was 50%   Patient completed concurrent chemoradiation with weekly carbotaxol chemotherapy on 03/12/2019.  Maintenance durvalumab started in January 2021 after scan showed partial response.  Patient completed 1 year of maintenance durvalumab in February 2022   Patient was found to have enlarging mediastinal adenopathyOn subsequent CT scan in January 2023.  This was followed by a PET scan which showed hypermetabolic mediastinal and right hilar lymph nodes but no other evidence of disease elsewhere.  This was biopsied and was consistent with non-small cell lung cancer specifically adenocarcinoma.  Cells were positive for TTF-1 and Napsin A and negative for p40.   Patient completed concurrent chemoradiation with 4 cycles of carbo Alimta ending in May 2023.  Scan showed partial response.  Plan is for maintenance Alimta until progression or toxicity.  Alimta was stopped in November 2023 due to progressive anemia and stable scans   Patient was hospitalized in November  2023 for acute on chronic hypoxic respiratory failure secondary to healthcare associated pneumonia.  She had pleural effusions which were tapped and thoracentesis was negative for malignancy.   Scans in March 2024 showed concern for progressive disease with appearance of new right lung nodules as well as progressive mediastinal adenopathy.  Also noted to have right pleural effusion which was tapped and was negative for malignancy    Interval history-overall patient feels at her baseline.  She has some ongoing fatigue and exertional shortness of breath which has remained unchanged.  She has chronic body aches and joint pains  ECOG PS- 2 Pain scale- 4 Opioid associated constipation- no  Review of systems- Review of Systems  Constitutional:  Positive for malaise/fatigue. Negative for chills, fever and weight loss.  HENT:  Negative for congestion, ear discharge and nosebleeds.   Eyes:  Negative for blurred vision.  Respiratory:  Positive for shortness of breath. Negative for cough, hemoptysis, sputum production and wheezing.   Cardiovascular:  Negative for chest pain, palpitations, orthopnea and claudication.  Gastrointestinal:  Negative for abdominal pain, blood in stool, constipation, diarrhea, heartburn, melena, nausea and vomiting.  Genitourinary:  Negative for dysuria, flank pain, frequency, hematuria and urgency.  Musculoskeletal:  Negative for back pain, joint pain and myalgias.  Skin:  Negative for rash.  Neurological:  Negative for dizziness, tingling, focal weakness, seizures, weakness and headaches.  Endo/Heme/Allergies:  Does not bruise/bleed easily.  Psychiatric/Behavioral:  Negative for depression and suicidal ideas. The patient does not have insomnia.       Allergies  Allergen Reactions   Aspirin Hives, Shortness Of Breath and Other (See Comments)    Difficulty breathing   Celebrex [Celecoxib]  Shortness Of Breath   Morphine And Codeine Nausea And Vomiting and Swelling    Adhesive [Tape] Other (See Comments)    Took top layer of skin off when removed.   Clarithromycin Nausea And Vomiting   Codeine Nausea And Vomiting   Darvon [Propoxyphene Hcl] Nausea And Vomiting   Demerol [Meperidine] Nausea And Vomiting   Flonase [Fluticasone Propionate] Other (See Comments)    Fungal infection in nose   Simvastatin Other (See Comments)    "caused ulcers in mouth, and fever"   Talwin [Pentazocine] Nausea And Vomiting     Past Medical History:  Diagnosis Date   Anemia    Asthma    No Inhalers--Dr. Meredeth Ide will order as needed   Bronchiectasis (HCC)    mild   Chronic headaches     followed by [facility] migraines   COPD (chronic obstructive pulmonary disease) (HCC)    DDD (degenerative disc disease), lumbar    Diverticulosis    Family history of adverse reaction to anesthesia    mother and sisters-PONV   Gall stones    history of   GERD (gastroesophageal reflux disease)    EGD 8/09- non bleeding erosive gastritis, documentd esophageal ulcerations.    Hiatal hernia    small   History of kidney stones    History of pneumonia    Hypercholesterolemia    IBS (irritable bowel syndrome)    Malignant neoplasm of upper lobe of left lung (HCC) 12/27/2018   Meniere disease    Murmur    Osteoarthritis    lumbar disc disease, left hip   Personal history of chemotherapy    Personal history of radiation therapy    Pneumonia 12/2020   PONV (postoperative nausea and vomiting)    Sleep apnea    uses cpap   Vertigo    Weakness of right side of body      Past Surgical History:  Procedure Laterality Date   ABDOMINAL HYSTERECTOMY  age 2   ANTERIOR CERVICAL DECOMP/DISCECTOMY FUSION N/A 02/24/2015   Procedure: CERVICAL FOUR-FIVE, CERVICAL FIVE-SIX, CERVICAL SIX-SEVEN ANTERIOR CERVICAL DECOMPRESSION/DISCECTOMY FUSION ;  Surgeon: Lisbeth Renshaw, MD;  Location: [facility];  Service: Neurosurgery;  Laterality: N/A;  C45 C56 C67 anterior cervical decompression  with fusion interbody prosthesis plating and bonegraft   APPENDECTOMY     BACK SURGERY  1988   4th lumbar fusion   BLADDER SURGERY N/A    with vaginal wall repair   BREAST CYST ASPIRATION Bilateral    neg   BREAST SURGERY Bilateral    cyst removed and reduction   CARDIAC CATHETERIZATION  2014   CARPAL TUNNEL RELEASE Right 02/11/2016   Procedure: CARPAL TUNNEL RELEASE;  Surgeon: Kennedy Bucker, MD;  Location: [facility] ORS;  Service: Orthopedics;  Laterality: Right;   CATARACT EXTRACTION W/ INTRAOCULAR LENS IMPLANT Bilateral 2015   CHOLECYSTECTOMY     EUS N/A 05/31/2012   Procedure: UPPER ENDOSCOPIC ULTRASOUND (EUS) LINEAR;  Surgeon: Rachael Fee, MD;  Location: [facility];  Service: Endoscopy;  Laterality: N/A;   EXCISIONAL HEMORRHOIDECTOMY     JOINT REPLACEMENT Bilateral    KNEE ARTHROSCOPY WITH LATERAL MENISECTOMY Right 07/07/2015   Procedure: KNEE ARTHROSCOPY WITH LATERAL MENISECTOMY, PARTIAL SYNOVECTOMY;  Surgeon: Kennedy Bucker, MD;  Location: [facility] ORS;  Service: Orthopedics;  Laterality: Right;   LUMBAR LAMINECTOMY     PORTA CATH INSERTION N/A 01/07/2019   Procedure: PORTA CATH INSERTION;  Surgeon: Annice Needy, MD;  Location: [facility];  Service: Cardiovascular;  Laterality: N/A;   REDUCTION MAMMAPLASTY  1990   RIGHT OOPHORECTOMY     TOTAL HIP ARTHROPLASTY Left 05/01/2014   Dr. Sheppard Evens   TOTAL HIP ARTHROPLASTY Right 08/05/2014   Procedure: TOTAL HIP ARTHROPLASTY ANTERIOR APPROACH;  Surgeon: Kennedy Bucker, MD;  Location: [facility] ORS;  Service: Orthopedics;  Laterality: Right;   ULNAR NERVE TRANSPOSITION Right 02/11/2016   Procedure: ULNAR NERVE DECOMPRESSION/TRANSPOSITION;  Surgeon: Kennedy Bucker, MD;  Location: [facility];  Service: Orthopedics;  Laterality: Right;   VIDEO BRONCHOSCOPY WITH ENDOBRONCHIAL ULTRASOUND Left 12/19/2018   Procedure: VIDEO BRONCHOSCOPY WITH ENDOBRONCHIAL ULTRASOUND, LEFT, SLEEP APNEA;  Surgeon: Vida Rigger, MD;  Location: [facility] ORS;   Service: Thoracic;  Laterality: Left;   VIDEO BRONCHOSCOPY WITH ENDOBRONCHIAL ULTRASOUND Right 05/14/2021   Procedure: VIDEO BRONCHOSCOPY WITH ENDOBRONCHIAL ULTRASOUND;  Surgeon: Salena Saner, MD;  Location: [facility] OR[facility];  Service: Pulmonary;  Laterality: Right;    Social History   Socioeconomic History   Marital status: Married    Spouse name: Not on file   Number of children: Not on file   Years of education: Not on file   Highest education level: Not on file  Occupational History   Not on file  Tobacco Use   Smoking status: Former    Packs/day: 1.00    Years: 40.00    Additional pack years: 0.00    Total pack years: 40.00    Types: Cigarettes    Quit date: 06/13/2007    Years since quitting: 15.1   Smokeless tobacco: Never  Vaping Use   Vaping Use: Never used  Substance and Sexual Activity   Alcohol use: No    Alcohol/week: 0.0 standard drinks of alcohol   Drug use: No   Sexual activity: Not on file  Other Topics Concern   Not on file  Social History Narrative   Not on file   Social Determinants of Health   Financial Resource Strain: Not on file  Food Insecurity: No Food Insecurity (04/06/2022)   Hunger Vital Sign    Worried About Running Out of Food in the Last Year: Never true    Ran Out of Food in the Last Year: Never true  Transportation Needs: No Transportation Needs (04/06/2022)   PRAPARE - Administrator, Civil Service (Medical): No    Lack of Transportation (Non-Medical): No  Physical Activity: Not on file  Stress: Not on file  Social Connections: Not on file  Intimate Partner Violence: Not At Risk (04/06/2022)   Humiliation, Afraid, Rape, and Kick questionnaire    Fear of Current or Ex-Partner: No    Emotionally Abused: No    Physically Abused: No    Sexually Abused: No    Family History  Problem Relation Age of Onset   Heart disease Mother        s/p stent   Hypertension Mother    Hypercholesterolemia Mother    Diabetes Father     Stomach cancer Other        uncle   Breast cancer Cousin    Breast cancer Paternal Aunt      Current Outpatient Medications:    acetaminophen (TYLENOL) 500 MG tablet, Take 500 mg by mouth 2 (two) times daily., Disp: , Rfl:    ezetimibe (ZETIA) 10 MG tablet, Take 5 mg by mouth in the morning and at bedtime., Disp: , Rfl:    HYDROcodone-acetaminophen (NORCO/VICODIN) 5-325 MG tablet, Take 1 tablet by mouth at bedtime., Disp: 30 tablet, Rfl: 0  loratadine (CLARITIN) 10 MG tablet, Take 10 mg by mouth daily., Disp: , Rfl:    ondansetron (ZOFRAN) 8 MG tablet, Take 1 tablet (8 mg total) by mouth every 8 (eight) hours as needed for nausea or vomiting., Disp: 20 tablet, Rfl: 0   pantoprazole (PROTONIX) 40 MG tablet, Take 40 mg by mouth 2 (two) times daily., Disp: , Rfl:    Polyethyl Glyc-Propyl Glyc PF (SYSTANE HYDRATION PF) 0.4-0.3 % SOLN, Place 1 drop into both eyes as needed. as, Disp: , Rfl:    polyethylene glycol powder (GLYCOLAX/MIRALAX) 17 GM/SCOOP powder, Take 17 g by mouth daily as needed for moderate constipation., Disp: , Rfl:    potassium chloride SA (KLOR-CON M) 20 MEQ tablet, Take 1 tablet (20 mEq total) by mouth daily., Disp: 7 tablet, Rfl: 0   spironolactone (ALDACTONE) 50 MG tablet, Take 50 mg by mouth 2 (two) times daily., Disp: , Rfl:    torsemide (DEMADEX) 20 MG tablet, Take 40 mg by mouth once., Disp: , Rfl:    UBRELVY 100 MG TABS, Take 100 mg by mouth daily as needed (migraines)., Disp: , Rfl:    Wheat Dextrin (EQ FIBER POWDER PO), Take 1 Dose by mouth as needed., Disp: , Rfl:    albuterol (VENTOLIN HFA) 108 (90 Base) MCG/ACT inhaler, Inhale 2 puffs into the lungs every 6 (six) hours as needed for wheezing or shortness of breath. (Patient not taking: Reported on 06/20/2022), Disp: , Rfl:    Bioflavonoid Products (ESTER C PO), Take 1 tablet by mouth daily. 500 mg (Patient not taking: Reported on 07/18/2022), Disp: , Rfl:    Calcium-Magnesium-Zinc (CAL-MAG-ZINC PO), Take 1 tablet by  mouth daily. (Patient not taking: Reported on 07/18/2022), Disp: , Rfl:    chlorhexidine (PERIDEX) 0.12 % solution, , Disp: , Rfl:    Cholecalciferol (EQL VITAMIN D3) 25 MCG (1000 UT) capsule, Take 1,000 Units by mouth daily. (Patient not taking: Reported on 07/18/2022), Disp: , Rfl:    Coenzyme Q10 (CO Q 10) 100 MG CAPS, Take 100 mg by mouth daily. (Patient not taking: Reported on 07/18/2022), Disp: , Rfl:    cyclobenzaprine (FLEXERIL) 10 MG tablet, Take 10 mg by mouth at bedtime. (Patient not taking: Reported on 08/01/2022), Disp: , Rfl:    DHEA 25 MG CAPS, Take 1 capsule by mouth daily. (Patient not taking: Reported on 07/18/2022), Disp: , Rfl:    Histamine Dihydrochloride (AUSTRALIAN DREAM ARTHRITIS) 0.025 % CREA, Apply 1 application. topically 4 (four) times daily as needed (pain). (Patient not taking: Reported on 07/18/2022), Disp: , Rfl:    Sennosides (SENNA) 8.6 MG CAPS, Take 1 capsule by mouth daily as needed (constipation). (Patient not taking: Reported on 07/18/2022), Disp: , Rfl:    triamcinolone (NASACORT) 55 MCG/ACT AERO nasal inhaler, Place 2 sprays into the nose daily as needed. (Patient not taking: Reported on 06/20/2022), Disp: , Rfl:   Physical exam:  Vitals:   08/01/22 0843  BP: 106/64  Pulse: (!) 106  Resp: 18  Temp: (!) 96.7 F (35.9 C)  TempSrc: Tympanic  SpO2: 100%  Weight: 160 lb 6.4 oz (72.8 kg)  Height: 5\' 1"  (1.549 m)   Physical Exam Cardiovascular:     Rate and Rhythm: Normal rate and regular rhythm.     Heart sounds: Normal heart sounds.  Pulmonary:     Effort: Pulmonary effort is normal.     Breath sounds: Normal breath sounds.  Abdominal:     General: Bowel sounds are normal.  Palpations: Abdomen is soft.  Skin:    General: Skin is warm and dry.  Neurological:     Mental Status: She is alert and oriented to person, place, and time.         Latest Ref Rng & Units 08/01/2022    8:26 AM  CMP  Glucose 70 - 99 mg/dL 409   BUN 8 - 23 mg/dL 13   Creatinine  8.11 - 1.00 mg/dL 9.14   Sodium 782 - 956 mmol/L 136   Potassium 3.5 - 5.1 mmol/L 4.1   Chloride 98 - 111 mmol/L 101   CO2 22 - 32 mmol/L 22   Calcium 8.9 - 10.3 mg/dL 8.6   Total Protein 6.5 - 8.1 g/dL 6.3   Total Bilirubin 0.3 - 1.2 mg/dL 0.3   Alkaline Phos 38 - 126 U/L 78   AST 15 - 41 U/L 20   ALT 0 - 44 U/L 12       Latest Ref Rng & Units 08/01/2022    8:26 AM  CBC  WBC 4.0 - 10.5 K/uL 5.3   Hemoglobin 12.0 - 15.0 g/dL 9.9   Hematocrit 21.3 - 46.0 % 30.5   Platelets 150 - 400 K/uL 433     No images are attached to the encounter.  DG Chest 2 View  Result Date: 07/18/2022 CLINICAL DATA:  shortness of breath; pleural effusion EXAM: CHEST - 2 VIEW COMPARISON:  06/23/2022 FINDINGS: Chest port catheter tip overlies the distal SVC. Unchanged cardiomediastinal silhouette. There is a persistent small right pleural effusion with adjacent masslike architectural distortion, fibrosis, and volume loss, similar to prior exam. Trace left pleural effusion, similar to prior, with adjacent left basilar atelectasis/scarring. No new airspace disease. No evidence of pneumothorax. No acute osseous abnormality. Partially visualized cervical spine fusion hardware. IMPRESSION: Unchanged small right pleural effusion and adjacent posttreatment pulmonary parenchymal changes. Unchanged trace left pleural effusion. No new airspace disease. Electronically Signed   By: Caprice Renshaw M.D.   On: 07/18/2022 11:04     Assessment and plan- Patient is a 77 y.o. female with history of recurrent locally advanced non-small cell lung cancer adenocarcinoma.  She is here for on treatment assessment prior to next cycle of maintenance Alimta  Counseled proceed with cycle 3 of Alimta today along with B12 and I will see her back in 3 weeks for cycle 4.  Plan to repeat scans after 4 cycles  Chemo-induced anemia: Hemoglobin has gradually trended down from 12-9.9 presently.  Check ferritin and iron studies B12 and folate at next  visit.  Kidney functions are normal so far.   Visit Diagnosis 1. Malignant neoplasm of upper lobe of left lung (HCC)   2. Anemia, unspecified type      Dr. Owens Shark, MD, MPH [facility] [id number] 08/01/2022 12:44 PM

## 2022-08-01 NOTE — Patient Instructions (Signed)
[facility]  Discharge Instructions: Thank you for choosing [facility] to provide your oncology and hematology care.  If you have a lab appointment with the Cancer Center, please go directly to the Cancer Center and check in at the registration area.  Wear comfortable clothing and clothing appropriate for easy access to any Portacath or PICC line.   We strive to give you quality time with your provider. You may need to reschedule your appointment if you arrive late (15 or more minutes).  Arriving late affects you and other patients whose appointments are after yours.  Also, if you miss three or more appointments without notifying the office, you may be dismissed from the clinic at the provider's discretion.      For prescription refill requests, have your pharmacy contact our office and allow 72 hours for refills to be completed.     To help prevent nausea and vomiting after your treatment, we encourage you to take your nausea medication as directed.  BELOW ARE SYMPTOMS THAT SHOULD BE REPORTED IMMEDIATELY: *FEVER GREATER THAN 100.4 F (38 C) OR HIGHER *CHILLS OR SWEATING *NAUSEA AND VOMITING THAT IS NOT CONTROLLED WITH YOUR NAUSEA MEDICATION *UNUSUAL SHORTNESS OF BREATH *UNUSUAL BRUISING OR BLEEDING *URINARY PROBLEMS (pain or burning when urinating, or frequent urination) *BOWEL PROBLEMS (unusual diarrhea, constipation, pain near the anus) TENDERNESS IN MOUTH AND THROAT WITH OR WITHOUT PRESENCE OF ULCERS (sore throat, sores in mouth, or a toothache) UNUSUAL RASH, SWELLING OR PAIN  UNUSUAL VAGINAL DISCHARGE OR ITCHING   Items with * indicate a potential emergency and should be followed up as soon as possible or go to the Emergency Department if any problems should occur.  Please show the CHEMOTHERAPY ALERT CARD or IMMUNOTHERAPY ALERT CARD at check-in to the Emergency Department and triage nurse.  Should you have questions after your visit  or need to cancel or reschedule your appointment, please contact [facility]  [phone] and follow the prompts.  Office hours are 8:00 a.m. to 4:30 p.m. Monday - Friday. Please note that voicemails left after 4:00 p.m. may not be returned until the following business day.  We are closed weekends and major holidays. You have access to a nurse at all times for urgent questions. Please call the main number to the clinic [phone] and follow the prompts.  For any non-urgent questions, you may also contact your provider using MyChart. We now offer e-Visits for anyone 18 and older to request care online for non-urgent symptoms. For details visit [email or web address].   Also download the MyChart app! Go to the app store, search "MyChart", open the app, select [facility], and log in with your MyChart username and password.    

## 2022-08-05 ENCOUNTER — Other Ambulatory Visit: Payer: Self-pay

## 2022-08-07 ENCOUNTER — Emergency Department: Payer: Medicare Other

## 2022-08-07 ENCOUNTER — Other Ambulatory Visit: Payer: Self-pay

## 2022-08-07 ENCOUNTER — Inpatient Hospital Stay
Admission: EM | Admit: 2022-08-07 | Discharge: 2022-08-09 | DRG: 309 | Disposition: A | Discharge status: Home / Self Care | Payer: Medicare Other | Attending: Hospitalist | Admitting: Hospitalist

## 2022-08-07 ENCOUNTER — Observation Stay: Payer: Medicare Other

## 2022-08-07 ENCOUNTER — Encounter: Payer: Self-pay | Admitting: Internal Medicine

## 2022-08-07 DIAGNOSIS — J910 Malignant pleural effusion: Secondary | ICD-10-CM | POA: Diagnosis present

## 2022-08-07 DIAGNOSIS — Z803 Family history of malignant neoplasm of breast: Secondary | ICD-10-CM

## 2022-08-07 DIAGNOSIS — E878 Other disorders of electrolyte and fluid balance, not elsewhere classified: Secondary | ICD-10-CM

## 2022-08-07 DIAGNOSIS — Z96643 Presence of artificial hip joint, bilateral: Secondary | ICD-10-CM | POA: Diagnosis present

## 2022-08-07 DIAGNOSIS — J9811 Atelectasis: Secondary | ICD-10-CM | POA: Diagnosis present

## 2022-08-07 DIAGNOSIS — Z9221 Personal history of antineoplastic chemotherapy: Secondary | ICD-10-CM

## 2022-08-07 DIAGNOSIS — E7800 Pure hypercholesterolemia, unspecified: Secondary | ICD-10-CM | POA: Diagnosis present

## 2022-08-07 DIAGNOSIS — Z888 Allergy status to other drugs, medicaments and biological substances status: Secondary | ICD-10-CM

## 2022-08-07 DIAGNOSIS — Z87891 Personal history of nicotine dependence: Secondary | ICD-10-CM

## 2022-08-07 DIAGNOSIS — Z800 Family history of malignant neoplasm of digestive organs: Secondary | ICD-10-CM

## 2022-08-07 DIAGNOSIS — E860 Dehydration: Secondary | ICD-10-CM | POA: Diagnosis present

## 2022-08-07 DIAGNOSIS — Z96641 Presence of right artificial hip joint: Secondary | ICD-10-CM | POA: Diagnosis present

## 2022-08-07 DIAGNOSIS — Z981 Arthrodesis status: Secondary | ICD-10-CM

## 2022-08-07 DIAGNOSIS — R531 Weakness: Secondary | ICD-10-CM

## 2022-08-07 DIAGNOSIS — Z8249 Family history of ischemic heart disease and other diseases of the circulatory system: Secondary | ICD-10-CM

## 2022-08-07 DIAGNOSIS — Z91048 Other nonmedicinal substance allergy status: Secondary | ICD-10-CM

## 2022-08-07 DIAGNOSIS — Z90721 Acquired absence of ovaries, unilateral: Secondary | ICD-10-CM

## 2022-08-07 DIAGNOSIS — Z90710 Acquired absence of both cervix and uterus: Secondary | ICD-10-CM

## 2022-08-07 DIAGNOSIS — J4489 Other specified chronic obstructive pulmonary disease: Secondary | ICD-10-CM | POA: Diagnosis present

## 2022-08-07 DIAGNOSIS — Z885 Allergy status to narcotic agent status: Secondary | ICD-10-CM

## 2022-08-07 DIAGNOSIS — K219 Gastro-esophageal reflux disease without esophagitis: Secondary | ICD-10-CM | POA: Diagnosis present

## 2022-08-07 DIAGNOSIS — Z833 Family history of diabetes mellitus: Secondary | ICD-10-CM

## 2022-08-07 DIAGNOSIS — C3412 Malignant neoplasm of upper lobe, left bronchus or lung: Secondary | ICD-10-CM | POA: Diagnosis present

## 2022-08-07 DIAGNOSIS — I4719 Other supraventricular tachycardia: Secondary | ICD-10-CM | POA: Diagnosis not present

## 2022-08-07 DIAGNOSIS — Z923 Personal history of irradiation: Secondary | ICD-10-CM

## 2022-08-07 DIAGNOSIS — I471 Supraventricular tachycardia, unspecified: Secondary | ICD-10-CM | POA: Diagnosis not present

## 2022-08-07 DIAGNOSIS — G4730 Sleep apnea, unspecified: Secondary | ICD-10-CM | POA: Diagnosis present

## 2022-08-07 DIAGNOSIS — N179 Acute kidney failure, unspecified: Secondary | ICD-10-CM | POA: Diagnosis present

## 2022-08-07 DIAGNOSIS — M47816 Spondylosis without myelopathy or radiculopathy, lumbar region: Secondary | ICD-10-CM | POA: Diagnosis present

## 2022-08-07 DIAGNOSIS — J479 Bronchiectasis, uncomplicated: Secondary | ICD-10-CM | POA: Diagnosis present

## 2022-08-07 DIAGNOSIS — D709 Neutropenia, unspecified: Secondary | ICD-10-CM | POA: Diagnosis not present

## 2022-08-07 DIAGNOSIS — I10 Essential (primary) hypertension: Secondary | ICD-10-CM | POA: Diagnosis present

## 2022-08-07 DIAGNOSIS — Z881 Allergy status to other antibiotic agents status: Secondary | ICD-10-CM

## 2022-08-07 DIAGNOSIS — E876 Hypokalemia: Secondary | ICD-10-CM | POA: Diagnosis present

## 2022-08-07 DIAGNOSIS — M1612 Unilateral primary osteoarthritis, left hip: Secondary | ICD-10-CM | POA: Diagnosis present

## 2022-08-07 DIAGNOSIS — Z8701 Personal history of pneumonia (recurrent): Secondary | ICD-10-CM

## 2022-08-07 DIAGNOSIS — I3139 Other pericardial effusion (noninflammatory): Secondary | ICD-10-CM

## 2022-08-07 DIAGNOSIS — Z9981 Dependence on supplemental oxygen: Secondary | ICD-10-CM

## 2022-08-07 DIAGNOSIS — E871 Hypo-osmolality and hyponatremia: Secondary | ICD-10-CM | POA: Diagnosis present

## 2022-08-07 DIAGNOSIS — I3131 Malignant pericardial effusion in diseases classified elsewhere: Secondary | ICD-10-CM | POA: Diagnosis present

## 2022-08-07 DIAGNOSIS — J9611 Chronic respiratory failure with hypoxia: Secondary | ICD-10-CM | POA: Diagnosis present

## 2022-08-07 DIAGNOSIS — J90 Pleural effusion, not elsewhere classified: Secondary | ICD-10-CM

## 2022-08-07 DIAGNOSIS — Z87442 Personal history of urinary calculi: Secondary | ICD-10-CM

## 2022-08-07 DIAGNOSIS — Z961 Presence of intraocular lens: Secondary | ICD-10-CM | POA: Diagnosis present

## 2022-08-07 DIAGNOSIS — Z886 Allergy status to analgesic agent status: Secondary | ICD-10-CM

## 2022-08-07 DIAGNOSIS — Z9049 Acquired absence of other specified parts of digestive tract: Secondary | ICD-10-CM

## 2022-08-07 DIAGNOSIS — Z8719 Personal history of other diseases of the digestive system: Secondary | ICD-10-CM

## 2022-08-07 DIAGNOSIS — K589 Irritable bowel syndrome without diarrhea: Secondary | ICD-10-CM | POA: Diagnosis present

## 2022-08-07 DIAGNOSIS — Z9842 Cataract extraction status, left eye: Secondary | ICD-10-CM

## 2022-08-07 DIAGNOSIS — Z83438 Family history of other disorder of lipoprotein metabolism and other lipidemia: Secondary | ICD-10-CM

## 2022-08-07 DIAGNOSIS — Z96653 Presence of artificial knee joint, bilateral: Secondary | ICD-10-CM | POA: Diagnosis present

## 2022-08-07 DIAGNOSIS — J8410 Pulmonary fibrosis, unspecified: Secondary | ICD-10-CM | POA: Diagnosis present

## 2022-08-07 DIAGNOSIS — Z9841 Cataract extraction status, right eye: Secondary | ICD-10-CM

## 2022-08-07 DIAGNOSIS — H8109 Meniere's disease, unspecified ear: Secondary | ICD-10-CM | POA: Diagnosis present

## 2022-08-07 LAB — COMPREHENSIVE METABOLIC PANEL
ALT: 14 U/L (ref 0–44)
AST: 27 U/L (ref 15–41)
Albumin: 3.9 g/dL (ref 3.5–5.0)
Alkaline Phosphatase: 90 U/L (ref 38–126)
Anion gap: 15 (ref 5–15)
BUN: 24 mg/dL — ABNORMAL HIGH (ref 8–23)
CO2: 27 mmol/L (ref 22–32)
Calcium: 8.8 mg/dL — ABNORMAL LOW (ref 8.9–10.3)
Chloride: 88 mmol/L — ABNORMAL LOW (ref 98–111)
Creatinine, Ser: 1.02 mg/dL — ABNORMAL HIGH (ref 0.44–1.00)
GFR, Estimated: 56 mL/min — ABNORMAL LOW (ref >60)
Glucose, Bld: 125 mg/dL — ABNORMAL HIGH (ref 70–99)
Potassium: 3.4 mmol/L — ABNORMAL LOW (ref 3.5–5.1)
Sodium: 130 mmol/L — ABNORMAL LOW (ref 135–145)
Total Bilirubin: 0.8 mg/dL (ref 0.3–1.2)
Total Protein: 7.3 g/dL (ref 6.5–8.1)

## 2022-08-07 LAB — CBC WITH DIFFERENTIAL/PLATELET
Abs Immature Granulocytes: 0.02 10*3/uL (ref 0.00–0.07)
Basophils Absolute: 0 10*3/uL (ref 0.0–0.1)
Basophils Relative: 1 %
Eosinophils Absolute: 0 10*3/uL (ref 0.0–0.5)
Eosinophils Relative: 1 %
HCT: 34.3 % — ABNORMAL LOW (ref 36.0–46.0)
Hemoglobin: 11.4 g/dL — ABNORMAL LOW (ref 12.0–15.0)
Immature Granulocytes: 1 %
Lymphocytes Relative: 17 %
Lymphs Abs: 0.7 10*3/uL (ref 0.7–4.0)
MCH: 29.9 pg (ref 26.0–34.0)
MCHC: 33.2 g/dL (ref 30.0–36.0)
MCV: 90 fL (ref 80.0–100.0)
Monocytes Absolute: 0.2 10*3/uL (ref 0.1–1.0)
Monocytes Relative: 5 %
Neutro Abs: 3 10*3/uL (ref 1.7–7.7)
Neutrophils Relative %: 75 %
Platelets: 376 10*3/uL (ref 150–400)
RBC: 3.81 MIL/uL — ABNORMAL LOW (ref 3.87–5.11)
RDW: 14.7 % (ref 11.5–15.5)
WBC: 4 10*3/uL (ref 4.0–10.5)
nRBC: 0 % (ref 0.0–0.2)

## 2022-08-07 LAB — TSH: TSH: 3.044 u[IU]/mL (ref 0.350–4.500)

## 2022-08-07 LAB — PROCALCITONIN: Procalcitonin: <0.1 ng/mL

## 2022-08-07 LAB — LACTIC ACID, PLASMA
Lactic Acid, Venous: 2.1 mmol/L (ref 0.5–1.9)
Lactic Acid, Venous: 1 mmol/L (ref 0.5–1.9)

## 2022-08-07 LAB — BRAIN NATRIURETIC PEPTIDE: B Natriuretic Peptide: 354.9 pg/mL — ABNORMAL HIGH (ref 0.0–100.0)

## 2022-08-07 LAB — TROPONIN I (HIGH SENSITIVITY): Troponin I (High Sensitivity): 10 ng/L (ref <18)

## 2022-08-07 MED ORDER — ONDANSETRON HCL 4 MG/2ML IJ SOLN
4.0000 mg | Freq: Once | INTRAMUSCULAR | Status: AC
  Administered 2022-08-07: 4 mg via INTRAVENOUS
  Filled 2022-08-07: qty 2

## 2022-08-07 MED ORDER — ACETAMINOPHEN 325 MG PO TABS: 650.0000 mg | ORAL_TABLET | Freq: Four times a day (QID) | ORAL | Status: DC | PRN

## 2022-08-07 MED ORDER — PANTOPRAZOLE SODIUM 40 MG PO TBEC
40.0000 mg | DELAYED_RELEASE_TABLET | Freq: Two times a day (BID) | ORAL | Status: DC
  Administered 2022-08-07 – 2022-08-09 (×4): 40 mg via ORAL
  Filled 2022-08-07 (×4): qty 1

## 2022-08-07 MED ORDER — ACETAMINOPHEN 650 MG RE SUPP: 650.0000 mg | Freq: Four times a day (QID) | RECTAL | Status: DC | PRN

## 2022-08-07 MED ORDER — CYCLOBENZAPRINE HCL 10 MG PO TABS
10.0000 mg | ORAL_TABLET | Freq: Every day | ORAL | Status: DC
  Filled 2022-08-07 (×2): qty 1

## 2022-08-07 MED ORDER — LACTATED RINGERS IV BOLUS
500.0000 mL | Freq: Once | INTRAVENOUS | Status: AC
  Administered 2022-08-07: 500 mL via INTRAVENOUS

## 2022-08-07 MED ORDER — SENNA 8.6 MG PO TABS: 1.0000 | ORAL_TABLET | Freq: Every day | ORAL | Status: DC | PRN

## 2022-08-07 MED ORDER — ADULT MULTIVITAMIN W/MINERALS CH
1.0000 | ORAL_TABLET | Freq: Every day | ORAL | Status: DC
  Administered 2022-08-07 – 2022-08-09 (×3): 1 via ORAL
  Filled 2022-08-07 (×3): qty 1

## 2022-08-07 MED ORDER — DILTIAZEM HCL 25 MG/5ML IV SOLN
10.0000 mg | Freq: Once | INTRAVENOUS | Status: AC
  Administered 2022-08-07: 10 mg via INTRAVENOUS
  Filled 2022-08-07: qty 5

## 2022-08-07 MED ORDER — ONDANSETRON HCL 4 MG/2ML IJ SOLN
4.0000 mg | Freq: Four times a day (QID) | INTRAMUSCULAR | Status: DC | PRN
  Administered 2022-08-09: 4 mg via INTRAVENOUS
  Filled 2022-08-07: qty 2

## 2022-08-07 MED ORDER — SODIUM CHLORIDE 0.9% FLUSH
3.0000 mL | Freq: Two times a day (BID) | INTRAVENOUS | Status: DC
  Administered 2022-08-07 – 2022-08-09 (×4): 3 mL via INTRAVENOUS

## 2022-08-07 MED ORDER — FOLIC ACID 1 MG PO TABS
1.0000 mg | ORAL_TABLET | Freq: Every day | ORAL | Status: DC
  Administered 2022-08-07 – 2022-08-09 (×3): 1 mg via ORAL
  Filled 2022-08-07 (×3): qty 1

## 2022-08-07 MED ORDER — ONDANSETRON HCL 4 MG PO TABS: 4.0000 mg | ORAL_TABLET | Freq: Four times a day (QID) | ORAL | Status: DC | PRN

## 2022-08-07 MED ORDER — IOHEXOL 350 MG/ML SOLN
75.0000 mL | Freq: Once | INTRAVENOUS | Status: AC | PRN
  Administered 2022-08-07: 75 mL via INTRAVENOUS

## 2022-08-07 MED ORDER — HYDROCODONE-ACETAMINOPHEN 5-325 MG PO TABS
1.0000 | ORAL_TABLET | Freq: Every day | ORAL | Status: DC
  Administered 2022-08-07 – 2022-08-08 (×2): 1 via ORAL
  Filled 2022-08-07 (×2): qty 1

## 2022-08-07 MED ORDER — THIAMINE MONONITRATE 100 MG PO TABS
100.0000 mg | ORAL_TABLET | Freq: Every day | ORAL | Status: DC
  Administered 2022-08-07 – 2022-08-09 (×3): 100 mg via ORAL
  Filled 2022-08-07 (×3): qty 1

## 2022-08-07 MED ORDER — EZETIMIBE 10 MG PO TABS
5.0000 mg | ORAL_TABLET | Freq: Two times a day (BID) | ORAL | Status: DC
  Administered 2022-08-07 – 2022-08-09 (×4): 5 mg via ORAL
  Filled 2022-08-07 (×4): qty 1

## 2022-08-07 MED ORDER — POTASSIUM CHLORIDE 20 MEQ PO PACK
40.0000 meq | PACK | Freq: Once | ORAL | Status: AC
  Administered 2022-08-07: 40 meq via ORAL
  Filled 2022-08-07: qty 2

## 2022-08-07 NOTE — Progress Notes (Signed)
Patient called with a heart rate of 160 feeling dizzy.  Recommend calling EMS/going to emergency room.  Patient reluctantly agreed.  GB

## 2022-08-07 NOTE — H&P (Addendum)
History and Physical    Patient: Jennifer Hodges:[id number] DOB: 1945/10/08 DOA: 08/07/2022 DOS: the patient was seen and examined on 08/07/2022 PCP: Marina Goodell, MD  Patient coming from: Home  Chief Complaint:  Chief Complaint  Patient presents with   Weakness   HPI: Jennifer Hodges is a 77 y.o. 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  [name] states that she has been experiencing gradually worsening general weakness over the last few months, however since her most recent chemotherapy session on 5/20 at which time she received Alimta, she has been experiencing severe generalized weakness that has made it difficult for her to care for herself.  In addition, she has been experiencing elevated heart rate at rest and that is exacerbated by any exertion.  Heart rates at home have been as high as 150-160. She has noticed that her blood pressure is a little lower than normal.  She endorses worsening shortness of breath and dyspnea on exertion but states that her lower extremity edema has significantly improved.  She endorses nausea and poor appetite denies any fever, chills, vomiting, diarrhea.  ED course: On arrival to the ED, patient was normotensive at 103/76 with heart rate of 150.  She was saturating at 100% on home oxygen.  She was afebrile at 47 Fahrenheit.  Initial workup notable for hemoglobin of 11.4, sodium of 130, potassium 3.4, chloride 88, glucose 125, BUN 24, creatinine 1.02 with GFR 56.  Lactic acid elevated 2.1.  Procalcitonin negative at less than 0.10.  EKG obtained was concerning for SVTs.  Patient was given a bolus  of diltiazem and converted to sinus rhythm.  Chest x-ray was obtained that demonstrated persistent moderate right-sided pleural effusion.  CTA was obtained that demonstrated no evidence of PE, however similar post radiation fibrosis of the lungs, small right pleural effusion with associated atelectasis, and an enlarged mediastinal lymphadenopathy with moderate pericardial effusion. Due to continued generalized weakness and shortness of breath.  TRH contacted for admission.  Review of Systems: As mentioned in the history of present illness. All other systems reviewed and are negative.  Past Medical History:  Diagnosis Date   Anemia    Asthma    No Inhalers--Dr. Meredeth Ide will order as needed   Bronchiectasis (HCC)    mild   Chronic headaches     followed by [facility] migraines   COPD (chronic obstructive pulmonary disease) (HCC)    DDD (degenerative disc disease), lumbar    Diverticulosis    Family history of adverse reaction to anesthesia    mother and sisters-PONV   Gall stones    history of   GERD (gastroesophageal reflux disease)    EGD 8/09- non bleeding erosive gastritis, documentd esophageal ulcerations.    Hiatal hernia    small   History of kidney stones    History of pneumonia    Hypercholesterolemia    IBS (irritable bowel syndrome)    Malignant neoplasm of upper lobe of left lung (HCC) 12/27/2018   Meniere disease    Murmur    Osteoarthritis    lumbar disc disease, left hip   Personal history of chemotherapy    Personal history of radiation therapy  Pneumonia 12/2020   PONV (postoperative nausea and vomiting)    Sleep apnea    uses cpap   Vertigo    Weakness of right side of body    Past Surgical History:  Procedure Laterality Date   ABDOMINAL HYSTERECTOMY  age 46   ANTERIOR CERVICAL DECOMP/DISCECTOMY FUSION N/A 02/24/2015   Procedure: CERVICAL FOUR-FIVE, CERVICAL FIVE-SIX, CERVICAL SIX-SEVEN ANTERIOR CERVICAL DECOMPRESSION/DISCECTOMY FUSION ;  Surgeon:  Lisbeth Renshaw, MD;  Location: [facility];  Service: Neurosurgery;  Laterality: N/A;  C45 C56 C67 anterior cervical decompression with fusion interbody prosthesis plating and bonegraft   APPENDECTOMY     BACK SURGERY  1988   4th lumbar fusion   BLADDER SURGERY N/A    with vaginal wall repair   BREAST CYST ASPIRATION Bilateral    neg   BREAST SURGERY Bilateral    cyst removed and reduction   CARDIAC CATHETERIZATION  2014   CARPAL TUNNEL RELEASE Right 02/11/2016   Procedure: CARPAL TUNNEL RELEASE;  Surgeon: Kennedy Bucker, MD;  Location: [facility];  Service: Orthopedics;  Laterality: Right;   CATARACT EXTRACTION W/ INTRAOCULAR LENS IMPLANT Bilateral 2015   CHOLECYSTECTOMY     EUS N/A 05/31/2012   Procedure: UPPER ENDOSCOPIC ULTRASOUND (EUS) LINEAR;  Surgeon: Rachael Fee, MD;  Location: [facility];  Service: Endoscopy;  Laterality: N/A;   EXCISIONAL HEMORRHOIDECTOMY     JOINT REPLACEMENT Bilateral    KNEE ARTHROSCOPY WITH LATERAL MENISECTOMY Right 07/07/2015   Procedure: KNEE ARTHROSCOPY WITH LATERAL MENISECTOMY, PARTIAL SYNOVECTOMY;  Surgeon: Kennedy Bucker, MD;  Location: [facility];  Service: Orthopedics;  Laterality: Right;   LUMBAR LAMINECTOMY     PORTA CATH INSERTION N/A 01/07/2019   Procedure: PORTA CATH INSERTION;  Surgeon: Annice Needy, MD;  Location: [facility];  Service: Cardiovascular;  Laterality: N/A;   REDUCTION MAMMAPLASTY  1990   RIGHT OOPHORECTOMY     TOTAL HIP ARTHROPLASTY Left 05/01/2014   Dr. Sheppard Evens   TOTAL HIP ARTHROPLASTY Right 08/05/2014   Procedure: TOTAL HIP ARTHROPLASTY ANTERIOR APPROACH;  Surgeon: Kennedy Bucker, MD;  Location: [facility];  Service: Orthopedics;  Laterality: Right;   ULNAR NERVE TRANSPOSITION Right 02/11/2016   Procedure: ULNAR NERVE DECOMPRESSION/TRANSPOSITION;  Surgeon: Kennedy Bucker, MD;  Location: [facility];  Service: Orthopedics;  Laterality: Right;   VIDEO BRONCHOSCOPY WITH ENDOBRONCHIAL ULTRASOUND Left 12/19/2018    Procedure: VIDEO BRONCHOSCOPY WITH ENDOBRONCHIAL ULTRASOUND, LEFT, SLEEP APNEA;  Surgeon: Vida Rigger, MD;  Location: [facility];  Service: Thoracic;  Laterality: Left;   VIDEO BRONCHOSCOPY WITH ENDOBRONCHIAL ULTRASOUND Right 05/14/2021   Procedure: VIDEO BRONCHOSCOPY WITH ENDOBRONCHIAL ULTRASOUND;  Surgeon: Salena Saner, MD;  Location: [facility];  Service: Pulmonary;  Laterality: Right;   Social History:  reports that she quit smoking about 15 years ago. Her smoking use included cigarettes. She has a 40.00 pack-year smoking history. She has never used smokeless tobacco. She reports that she does not drink alcohol and does not use drugs.  Allergies  Allergen Reactions   Aspirin Hives, Shortness Of Breath and Other (See Comments)    Difficulty breathing   Celebrex [Celecoxib] Shortness Of Breath   Morphine And Codeine Nausea And Vomiting and Swelling   Adhesive [Tape] Other (See Comments)    Took top layer of skin off when removed.   Clarithromycin Nausea And Vomiting   Codeine Nausea And Vomiting   Darvon [Propoxyphene Hcl] Nausea And Vomiting   Demerol [Meperidine] Nausea And Vomiting   Flonase [Fluticasone Propionate] Other (See Comments)  Fungal infection in nose   Simvastatin Other (See Comments)    "caused ulcers in mouth, and fever"   Talwin [Pentazocine] Nausea And Vomiting    Family History  Problem Relation Age of Onset   Heart disease Mother        s/p stent   Hypertension Mother    Hypercholesterolemia Mother    Diabetes Father    Stomach cancer Other        uncle   Breast cancer Cousin    Breast cancer Paternal Aunt     Prior to Admission medications   Medication Sig Start Date End Date Taking? Authorizing Provider  acetaminophen (TYLENOL) 500 MG tablet Take 500 mg by mouth 2 (two) times daily.    [provider]  albuterol (VENTOLIN HFA) 108 (90 Base) MCG/ACT inhaler Inhale 2 puffs into the lungs every 6 (six) hours as needed for wheezing or  shortness of breath. Patient not taking: Reported on 06/20/2022    [provider]  Bioflavonoid Products (ESTER C PO) Take 1 tablet by mouth daily. 500 mg Patient not taking: Reported on 07/18/2022    [provider]  Calcium-Magnesium-Zinc (CAL-MAG-ZINC PO) Take 1 tablet by mouth daily. Patient not taking: Reported on 07/18/2022    [provider]  chlorhexidine (PERIDEX) 0.12 % solution  09/28/21   [provider]  Cholecalciferol (EQL VITAMIN D3) 25 MCG (1000 UT) capsule Take 1,000 Units by mouth daily. Patient not taking: Reported on 07/18/2022    [provider]  Coenzyme Q10 (CO Q 10) 100 MG CAPS Take 100 mg by mouth daily. Patient not taking: Reported on 07/18/2022    [provider]  cyclobenzaprine (FLEXERIL) 10 MG tablet Take 10 mg by mouth at bedtime. Patient not taking: Reported on 08/01/2022    [provider]  DHEA 25 MG CAPS Take 1 capsule by mouth daily. Patient not taking: Reported on 07/18/2022    [provider]  ezetimibe (ZETIA) 10 MG tablet Take 5 mg by mouth in the morning and at bedtime. 10/26/18   [provider]  Histamine Dihydrochloride (AUSTRALIAN DREAM ARTHRITIS) 0.025 % CREA Apply 1 application. topically 4 (four) times daily as needed (pain). Patient not taking: Reported on 07/18/2022    [provider]  HYDROcodone-acetaminophen (NORCO/VICODIN) 5-325 MG tablet Take 1 tablet by mouth at bedtime. 02/09/22   Pennie Banter, DO  loratadine (CLARITIN) 10 MG tablet Take 10 mg by mouth daily.    [provider]  ondansetron (ZOFRAN) 8 MG tablet Take 1 tablet (8 mg total) by mouth every 8 (eight) hours as needed for nausea or vomiting. 05/31/22   Creig Hines, MD  pantoprazole (PROTONIX) 40 MG tablet Take 40 mg by mouth 2 (two) times daily.    [provider]  Polyethyl Glyc-Propyl Glyc PF (SYSTANE HYDRATION PF) 0.4-0.3 % SOLN Place 1 drop into both eyes as needed. as     [provider]  polyethylene glycol powder (GLYCOLAX/MIRALAX) 17 GM/SCOOP powder Take 17 g by mouth daily as needed for moderate constipation.    [provider]  potassium chloride SA (KLOR-CON M) 20 MEQ tablet Take 1 tablet (20 mEq total) by mouth daily. 07/25/22   Borders, Daryl Eastern, NP  Sennosides (SENNA) 8.6 MG CAPS Take 1 capsule by mouth daily as needed (constipation). Patient not taking: Reported on 07/18/2022    [provider]  spironolactone (ALDACTONE) 50 MG tablet Take 50 mg by mouth 2 (two) times daily. 06/21/22  [provider]  torsemide (DEMADEX) 20 MG tablet Take 40 mg by mouth once. 04/19/22   [provider]  triamcinolone (NASACORT) 55 MCG/ACT AERO nasal inhaler Place 2 sprays into the nose daily as needed. Patient not taking: Reported on 06/20/2022    [provider]  UBRELVY 100 MG TABS Take 100 mg by mouth daily as needed (migraines). 01/30/20   [provider]  Wheat Dextrin (EQ FIBER POWDER PO) Take 1 Dose by mouth as needed.    [provider]    Physical Exam: Vitals:   08/07/22 1230 08/07/22 1300 08/07/22 1330 08/07/22 1608  BP: 102/60 105/75 107/68   Pulse: (!) 155 (!) 156 87   Resp: 17 (!) 22 16   Temp:    98.4 F (36.9 C)  SpO2: 100% 100% 100%   Weight:       Physical Exam Vitals and nursing note reviewed.  Constitutional:      Appearance: She is ill-appearing (Chronically).  HENT:     Head: Normocephalic and atraumatic.  Eyes:     Conjunctiva/sclera: Conjunctivae normal.     Pupils: Pupils are equal, round, and reactive to light.  Cardiovascular:     Rate and Rhythm: Regular rhythm. Tachycardia present.     Pulses:          Dorsalis pedis pulses are 2+ on the right side and 2+ on the left side.     Heart sounds: Heart sounds are distant. No murmur heard.    Comments: Left leg larger than the right, however nonpitting Pulmonary:     Effort: Tachypnea present.     Breath sounds:  Rales (Right middle and lower lung fields) present. No wheezing.  Abdominal:     General: Bowel sounds are normal.     Palpations: Abdomen is soft.  Musculoskeletal:     Right lower leg: No edema.     Left lower leg: No edema.  Skin:    General: Skin is warm and dry.     Coloration: Skin is pale.  Neurological:     Mental Status: She is alert and oriented to person, place, and time.     Motor: Weakness present.     Comments:  4 out of 5 strength throughout  Psychiatric:        Mood and Affect: Mood normal.        Behavior: Behavior normal.    Data Reviewed: CBC with WBC of 4.0, hemoglobin 11.4, MCV 90 and platelets of 376 CMP with sodium of 130, potassium 3.4, chloride 88, bicarb 27, glucose 125, BUN 24, creatinine 1.02, AST 27, ALT 14 and GFR 56 Troponin negative at 10 Lactic acid elevated 2.1 with downtrend to 1.0 Procalcitonin less than 0.10 TSH within normal limits at 3.0  Initial EKG personally reviewed.  Narrow complex regular tachycardia, consistent with SVT.  Rate of 153.  Repeat EKG personally reviewed.  Sinus rhythm with rate of 86.  Multiple PACs and PVCs.  Low voltage noted.  US Venous Img Lower Unilateral Left (DVT)  Result Date: 08/07/2022 CLINICAL DATA:  Left lower extremity edema. EXAM: LEFT LOWER EXTREMITY VENOUS DOPPLER ULTRASOUND TECHNIQUE: Gray-scale sonography with compression, as well as color and duplex ultrasound, were performed to evaluate the deep venous system(s) from the level of the common femoral vein through the popliteal and proximal calf veins. COMPARISON:  None Available. FINDINGS: VENOUS Normal compressibility of the common femoral, superficial femoral, and popliteal veins, as well as the visualized calf veins. Visualized  portions of profunda femoral vein and great saphenous vein unremarkable. No filling defects to suggest DVT on grayscale or color Doppler imaging. Doppler waveforms show normal direction of venous flow, normal respiratory plasticity  and response to augmentation. Limited views of the contralateral common femoral vein are unremarkable. OTHER Fluid collection in the left popliteal fossa measures 2.5 by 0.9 x 2.6 cm. Limitations: None IMPRESSION: 1. No evidence for left lower extremity DVT. 2. Small left Baker's cyst. Electronically Signed   By: Signa Kell M.D.   On: 08/07/2022 18:01   CT Angio Chest PE W and/or Wo Contrast  Result Date: 08/07/2022 CLINICAL DATA:  PE suspected lung cancer * Tracking Code: BO * EXAM: CT ANGIOGRAPHY CHEST WITH CONTRAST TECHNIQUE: Multidetector CT imaging of the chest was performed using the standard protocol during bolus administration of intravenous contrast. Multiplanar CT image reconstructions and MIPs were obtained to evaluate the vascular anatomy. RADIATION DOSE REDUCTION: This exam was performed according to the departmental dose-optimization program which includes automated exposure control, adjustment of the mA and/or kV according to patient size and/or use of iterative reconstruction technique. CONTRAST:  75mL OMNIPAQUE IOHEXOL 350 MG/ML SOLN COMPARISON:  PET-CT, 05/30/2022 FINDINGS: Cardiovascular: Satisfactory opacification of the pulmonary arteries to the segmental level. No evidence of pulmonary embolism. Cardiomegaly. Left and right coronary artery calcifications. Moderate pericardial effusion, increased compared to prior examination. Right chest port catheter. Aortic atherosclerosis Mediastinum/Nodes: Unchanged matted soft tissue throughout the mediastinum and hila without discretely enlarged lymph nodes. Thyroid gland, trachea, and esophagus demonstrate no significant findings. Lungs/Pleura: Small right pleural effusion and associated atelectasis or consolidation, improved compared to prior examination. Similar post treatment appearance of the chest, with dense paramedian perihilar consolidation fibrosis of the lungs, with some interval evolution of radiation fibrosis. Moderate underlying  emphysema. Upper Abdomen: No acute abnormality. Musculoskeletal: No chest wall abnormality. No acute osseous findings. Review of the MIP images confirms the above findings. IMPRESSION: 1. Negative examination for pulmonary embolism. 2. Similar post treatment appearance of the chest, with dense paramedian perihilar consolidation fibrosis of the lungs, with some interval evolution of radiation fibrosis. 3. Small right pleural effusion and associated atelectasis or consolidation, improved compared to prior examination. 4. Unchanged matted soft tissue throughout the mediastinum and hila without discretely enlarged lymph nodes. 5. Cardiomegaly and coronary artery disease. 6. Moderate pericardial effusion, increased compared to prior examination. Aortic Atherosclerosis (ICD10-I70.0). Electronically Signed   By: Jearld Lesch M.D.   On: 08/07/2022 14:58   DG Chest Port 1 View  Result Date: 08/07/2022 CLINICAL DATA:  [id number] SOB (shortness of breath) [id number] EXAM: PORTABLE CHEST - 1 VIEW COMPARISON:  07/18/2022 FINDINGS: Persistent moderate right pleural effusion with adjacent opacities at the right lung base. Coarse non anatomic opacities at the right hilum slightly more conspicuous. Right IJ port catheter stable, tip distal SVC. Coarse left perihilar interstitial opacities. Heart size and mediastinal contours are within normal limits. Aortic Atherosclerosis (ICD10-170.0). No pneumothorax. Cervical fixation hardware partially visualized. IMPRESSION: Persistent moderate right pleural effusion with adjacent right lower lung atelectasis/infiltrate. Electronically Signed   By: Corlis Leak M.D.   On: 08/07/2022 12:25    Results are pending, will review when available.  Assessment and Plan:  * SVT (supraventricular tachycardia) Patient is presenting with tachycardia with EKG consistent with SVT's.  She converted to sinus rhythm with rate around 100 after one-time dose of diltiazem.  Patient is that her heart rate has  frequently been increasing up to 160 at home, so I suspect this  is possibly recurrent.  I am concerned this is either due to recent dose of chemotherapy versus enlarging pericardial effusion.  Electrolyte abnormalities are likely also contributing.  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  At last echo in March 2024, effusion was measured be approximately 1 cm.  On CT imaging obtained around that time, it measured less than 1 cm.  On CTA obtained today, effusion measures at least 2 cm at its largest.  Given patient's symptoms of worsening dyspnea on exertion, worsening tachycardia and generalized weakness, I am concerned for possible impending tamponade  Addendum: Bedside echo with Dr. Juliann Pares with small pericardial effusion, no evidence of tamponade.   - Stat echocardiogram ordered - Cardiology consulted; appreciate their recommendations  AKI (acute kidney injury) (HCC) Likely due to aggressive diuresis in light of poor p.o. intake.  She appears euvolemic on exam, despite pleural effusions and pericardial effusion, which are likely malignant in nature.  - Hold further diuretics - S/p 1 L of fluids - Repeat BMP in the a.m.  Pleural effusion on right History of recurrent right-sided pleural effusion with most recent thoracentesis on 4/11.  Pleural effusion at this point is small with no access for drainage.  In the setting of AKI, holding further diuretics.  Chronic hypoxic respiratory failure (HCC) Patient currently wears 3 L of supplemental oxygen at baseline in the setting of COPD, postradiation fibrosis, and chronic pleural effusions.  Currently experiencing worsening dyspnea on exertion and shortness of breath, however oxygen saturations has  maintained on her home levels.  - Continue home supplemental oxygen  Electrolyte abnormality Patient presenting with mild hyponatremia and hypokalemia likely due to poor p.o. intake and diuretic use.  - 1 L of IV fluids - One-time dose of Kdur - Repeat BMP in the a.m.  Generalized weakness Likely multifactorial in the setting of progressive and recurrent stage III adenocarcinoma of the lung, deconditioning the setting of ongoing chemotherapy, and pericardial effusion may potentially be contributing.  - PT/OT  Malignant neoplasm of upper lobe of left lung [facility]) Patient has a history of stage IIIa adenocarcinoma of the lung s/p chemoradiation, currently going chemoradiation for recurrence.  Most recent dose of chemotherapy was on 5/20.  Advance Care Planning:   Code Status: Full Code Mrs. Scarpino states although she was previously a DO NOT RESUSCITATE, she would like to be resuscitated at this time in case of cardiac arrest so her family has time to come by and be with her.  Consults: Cardiology  Family Communication: No family at bedside  Severity of Illness: The appropriate patient status for this patient is OBSERVATION. Observation status is judged to be reasonable and necessary in order to provide the required intensity of service to ensure the patient's safety. The patient's presenting symptoms, physical exam findings, and initial radiographic and laboratory data in the context of their medical condition is felt to place them at decreased risk for further clinical deterioration. Furthermore, it is anticipated that the patient will be medically stable for discharge from the hospital within 2 midnights of admission.   Author: Verdene Lennert, MD 08/07/2022 7:53 PM  For on call review [email or web address].

## 2022-08-07 NOTE — Assessment & Plan Note (Addendum)
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  At last echo in March 2024, effusion was measured be approximately 1 cm.  On CT imaging obtained around that time, it measured less than 1 cm.  On CTA obtained today, effusion measures at least 2 cm at its largest.  Given patient's symptoms of worsening dyspnea on exertion, worsening tachycardia and generalized weakness, I am concerned for possible impending tamponade  Addendum: Bedside echo with Dr. Juliann Pares with small pericardial effusion, no evidence of tamponade.   - Stat echocardiogram ordered - Cardiology consulted; appreciate their recommendations

## 2022-08-07 NOTE — Assessment & Plan Note (Signed)
History of recurrent right-sided pleural effusion with most recent thoracentesis on 4/11.  Pleural effusion at this point is small with no access for drainage.  In the setting of AKI, holding further diuretics.

## 2022-08-07 NOTE — Assessment & Plan Note (Signed)
Patient is presenting with tachycardia with EKG consistent with SVT's.  She converted to sinus rhythm with rate around 100 after one-time dose of diltiazem.  Patient is that her heart rate has frequently been increasing up to 160 at home, so I suspect this is possibly recurrent.  I am concerned this is either due to recent dose of chemotherapy versus enlarging pericardial effusion.  Electrolyte abnormalities are likely also contributing.  - Telemetry monitoring - Diltiazem 10 mg IV push for sustained heart rate above 120 - Electrolyte replacement as noted below - Management of pericardial effusion as noted below

## 2022-08-07 NOTE — ED Triage Notes (Signed)
Pt arrived via EMS. Pt is having weakness. Pt sts that her chemo amount has been increased and since than she has been having extreme weakness. Pt has stage 3 lung cancer. Pt wears 3l/min via [place]. Pt sts that she has been SOB for the last month also.

## 2022-08-07 NOTE — Assessment & Plan Note (Signed)
Likely due to aggressive diuresis in light of poor p.o. intake.  She appears euvolemic on exam, despite pleural effusions and pericardial effusion, which are likely malignant in nature.  - Hold further diuretics - S/p 1 L of fluids - Repeat BMP in the a.m.

## 2022-08-07 NOTE — Assessment & Plan Note (Signed)
Patient has a history of stage IIIa adenocarcinoma of the lung s/p chemoradiation, currently going chemoradiation for recurrence.  Most recent dose of chemotherapy was on 5/20.

## 2022-08-07 NOTE — Assessment & Plan Note (Signed)
Patient presenting with mild hyponatremia and hypokalemia likely due to poor p.o. intake and diuretic use.  - 1 L of IV fluids - One-time dose of Kdur - Repeat BMP in the a.m.

## 2022-08-07 NOTE — ED Provider Notes (Addendum)
[facility] Provider Note    Event Date/Time   First MD Initiated Contact with Patient 08/07/22 1146     (approximate)   History   Weakness   HPI  Jennifer Hodges is a 77 y.o. female with past medical history of stage III adenocarcinoma of the lung, vocal cord paralysis who presents because of weakness.  Patient notes that since having her last chemotherapy May 20th she has been extremely fatigued and weak.  Feels very rundown.  She has a chronic cough which does seem to be worse than normal.  She is more dyspneic than her baseline denies chest pain.  Denies lower extremity swelling.  Has had some nausea with some vomiting.  No abdominal pain diarrhea constipation.  Denies fevers.  She said she felt very shaky today and noted that her heart rate was in the 150s which is atypical for her.  I reviewed last oncology note.  Patient has completed chemo and radiation for her stage III adenocarcinoma.  In March 2024 scans showed progressive disease with new right lung nodules and progressive mediastinal adenopathy.  She is currently on Alimta, started on cycle 3.     Past Medical History:  Diagnosis Date   Anemia    Asthma    No Inhalers--Dr. Meredeth Ide will order as needed   Bronchiectasis (HCC)    mild   Chronic headaches     followed by [facility] migraines   COPD (chronic obstructive pulmonary disease) (HCC)    DDD (degenerative disc disease), lumbar    Diverticulosis    Family history of adverse reaction to anesthesia    mother and sisters-PONV   Gall stones    history of   GERD (gastroesophageal reflux disease)    EGD 8/09- non bleeding erosive gastritis, documentd esophageal ulcerations.    Hiatal hernia    small   History of kidney stones    History of pneumonia    Hypercholesterolemia    IBS (irritable bowel syndrome)    Malignant neoplasm of upper lobe of left lung (HCC) 12/27/2018   Meniere disease    Murmur    Osteoarthritis    lumbar  disc disease, left hip   Personal history of chemotherapy    Personal history of radiation therapy    Pneumonia 12/2020   PONV (postoperative nausea and vomiting)    Sleep apnea    uses cpap   Vertigo    Weakness of right side of body     Patient Active Problem List   Diagnosis Date Noted   Nausea without vomiting 07/18/2022   Pneumonia 04/05/2022   COPD with acute exacerbation (HCC) 04/05/2022   Palliative care encounter 02/08/2022   Pleural effusion on right 02/05/2022   Lobar pneumonia (HCC) 02/04/2022   Aspiration pneumonia (HCC) 02/04/2022   Edema of lower extremity 06/16/2021   Chronic anxiety 06/16/2021   Cervical post-laminectomy syndrome 06/16/2021   Adenocarcinoma of lung (HCC) 06/16/2021   Anemia 05/26/2021   Postobstructive pneumonia 01/06/2021   Benign breast lumps 01/11/2019   Gout 01/11/2019   Hives 01/11/2019   Lung disease 01/11/2019   Migraine headache 01/11/2019   Goals of care, counseling/discussion 12/27/2018   Malignant neoplasm of upper lobe of left lung (HCC) 12/27/2018   Night sweats 08/07/2016   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   Osteopenia of multiple sites 04/27/2016   Left carpal tunnel syndrome 02/02/2016   Carotid stenosis, asymptomatic, bilateral 12/29/2015   Prediabetes 09/28/2015   Primary osteoarthritis involving multiple  joints 09/28/2015   Knee pain 05/11/2015   Chest tightness 03/24/2015   Stool incontinence 03/24/2015   Urinary frequency 03/04/2015   Cervical spondylosis with radiculopathy 02/24/2015   Pre-syncope 02/19/2015   URI (upper respiratory infection) 02/15/2015   Neck pain 12/25/2014   Pelvic pain in female 11/16/2014   Primary osteoarthritis of hip 08/05/2014   Heel pain 06/01/2014   Diarrhea 06/01/2014   Health care maintenance 06/01/2014   Right leg pain 03/30/2014   Right arm pain 03/30/2014   Sinusitis 03/30/2014   Internal nasal lesion 11/12/2013   Other specified disorders of nose and nasal sinuses  11/12/2013   Chronic tension-type headache, intractable 10/30/2013   Intractable migraine without aura and without status migrainosus 10/30/2013   Occipital neuralgia of left side 10/30/2013   IBS (irritable bowel syndrome) 09/03/2013   Rib pain 08/04/2013   Cervical radiculitis 08/01/2013   Lumbar radiculitis 08/01/2013   Palpitations 07/25/2013   Benign essential hypertension 05/28/2013   Neuralgia, neuritis, and radiculitis, unspecified 05/28/2013   Vitamin D deficiency 05/28/2013   Elevated AFP 04/28/2013   Abnormality of alpha-fetoprotein 04/28/2013   Osteoporosis 11/12/2012   Incomplete emptying of bladder 07/11/2012   Prolapse of vaginal vault after hysterectomy 07/11/2012   Nonspecific (abnormal) findings on radiological and other examination of gastrointestinal tract 05/31/2012   Bronchiectasis (HCC) 02/05/2012   Sleep apnea 02/05/2012   Degenerative disc disease, cervical 02/05/2012   Hypercholesterolemia 02/05/2012   GERD (gastroesophageal reflux disease) 02/05/2012   Chronic headaches 02/05/2012     Physical Exam  Triage Vital Signs: ED Triage Vitals  Enc Vitals Group     BP      Pulse      Resp      Temp      Temp src      SpO2      Weight      Height      Head Circumference      Peak Flow      Pain Score      Pain Loc      Pain Edu?      Excl. in GC?     Most recent vital signs: Vitals:   08/07/22 1300 08/07/22 1330  BP: 105/75 107/68  Pulse: (!) 156 87  Resp: (!) 22 16  Temp:    SpO2: 100% 100%     General: Awake, no distress.  CV:  Good peripheral perfusion.  Resp:  Normal effort. Nml wob, no wheezing, lungs are clear  Abd:  No distention. Soft, nontender Neuro:             Awake, Alert, Oriented x 3  Other:     ED Results / Procedures / Treatments  Labs (all labs ordered are listed, but only abnormal results are displayed) Labs Reviewed  LACTIC ACID, PLASMA - Abnormal; Notable for the following components:      Result Value    Lactic Acid, Venous 2.1 (*)    All other components within normal limits  CBC WITH DIFFERENTIAL/PLATELET - Abnormal; Notable for the following components:   RBC 3.81 (*)    Hemoglobin 11.4 (*)    HCT 34.3 (*)    All other components within normal limits  COMPREHENSIVE METABOLIC PANEL - Abnormal; Notable for the following components:   Sodium 130 (*)    Potassium 3.4 (*)    Chloride 88 (*)    Glucose, Bld 125 (*)    BUN 24 (*)    Creatinine, Ser 1.02 (*)  Calcium 8.8 (*)    GFR, Estimated 56 (*)    All other components within normal limits  CULTURE, BLOOD (ROUTINE X 2)  CULTURE, BLOOD (ROUTINE X 2)  TSH  PROCALCITONIN  LACTIC ACID, PLASMA  TROPONIN I (HIGH SENSITIVITY)     EKG  EKG reviewed interpreted myself shows a narrow complex tachycardia that is regular with rates in the 150s with some diffuse ST changes no obvious P wave   RADIOLOGY I reviewed and interpreted patient CT angio of the chest which is negative for pulmonary embolism   PROCEDURES:  Critical Care performed: Yes, see critical care procedure note(s)  Procedures  The patient is on the cardiac monitor to evaluate for evidence of arrhythmia and/or significant heart rate changes.   MEDICATIONS ORDERED IN ED: Medications  lactated ringers bolus 500 mL (0 mLs Intravenous Stopped 08/07/22 1310)  diltiazem (CARDIZEM) injection 10 mg (10 mg Intravenous Given 08/07/22 1301)  ondansetron (ZOFRAN) injection 4 mg (4 mg Intravenous Given 08/07/22 1343)  lactated ringers bolus 500 mL (500 mLs Intravenous New Bag/Given 08/07/22 1338)  iohexol (OMNIPAQUE) 350 MG/ML injection 75 mL (75 mLs Intravenous Contrast Given 08/07/22 1355)     IMPRESSION / MDM / ASSESSMENT AND PLAN / ED COURSE  I reviewed the triage vital signs and the nursing notes.                              Patient's presentation is most consistent with acute presentation with potential threat to life or bodily function.  Differential diagnosis  includes, but is not limited to, atrial flutter, supraventricular tachycardia, electrolyte abnormality, anemia, pneumonia, recurrent pleural effusions, pulmonary embolism Patient is a 77 year old female with stage III locally recurrent adenocarcinoma currently on chemotherapy who presents today because of generalized weakness.  Since her last chemotherapy 6 days ago she has been extremely weak and today noted elevated heart rates in the 150s.  She has had some dyspnea and increased cough above her baseline no fevers chills.  Has had some nausea vomiting poor p.o. intake.  Denies urinary symptoms.  On arrival patient is afebrile mildly tachypneic with sats 100% on her baseline 2 L.  Heart rate is in the 150s and EKG shows a narrow complex tachycardia without clear P waves differential includes SVT versus atrial flutter.  Heart rate is not significantly wavering from 150 so I do suspect this could be flutter.  She is not anticoagulated has no history of such.  Patient otherwise looks fairly well she does not appear volume overloaded abdominal exam is benign.  Does look somewhat dry.  Plan to check labs including CBC CMP lactate troponin.  Will obtain chest x-ray given her worsening dyspnea.  Will give a small fluid bolus and likely treat the rate with IV diltiazem.  Patient does have normal EF on last echo, and the dose will treat  an SVT or lfutter.   After IV Dilt patient's heart rate did slowly downtrend and then she converted to what appears to be sinus rhythm.  Repeat EKG is somewhat difficult to discern P waves but I do see small P waves in lead V1 primarily.  It is regular with some ectopy.  Patient's chest x-ray does show persistent moderate-sized pleural effusion.  Difficult to tell whether there is underlying infection but given worsening cough and dyspnea will obtain CT angio to further evaluate for PE and underlying infiltrate.  Patient's labs are notable for AKI with  creatinine around 1 from  baseline around 0.6.  No leukocytosis.  Troponin is negative.  Lactate mildly elevated at 2.1.  CT angio is negative for pulmonary embolism.  Patient has a small pleural effusion with some compressive atelectasis versus consolidation but overall radiology comments that this is improved from prior.  Patient has no leukocytosis and procalcitonin is negative.  Initial lactate was mildly elevated will repeat but I have low suspicion for sepsis at this time this could have been in the setting of her tachycardia.   Discussed with patient option for discharge and outpatient follow-up with cardiology versus admission to the hospital and she strongly preferred to come into the hospital if she continues to feel weak and very dyspneic with ambulation.  I think this is reasonable given her SVT today AKI and underlying degree of illness.    Clinical Course as of 08/07/22 1523  Sun Aug 07, 2022  1221 RBC(!): 3.81 [KM]    Clinical Course User Index [KM] Georga Hacking, MD     FINAL CLINICAL IMPRESSION(S) / ED DIAGNOSES   Final diagnoses:  SVT (supraventricular tachycardia)  Pleural effusion  AKI (acute kidney injury) (HCC)     Rx / DC Orders   ED Discharge Orders     None        Note:  This document was prepared using Dragon voice recognition software and may include unintentional dictation errors.   Georga Hacking, MD 08/07/22 1507    Georga Hacking, MD 08/07/22 [phone]

## 2022-08-07 NOTE — Assessment & Plan Note (Signed)
Likely multifactorial in the setting of progressive and recurrent stage III adenocarcinoma of the lung, deconditioning the setting of ongoing chemotherapy, and pericardial effusion may potentially be contributing.  - PT/OT

## 2022-08-07 NOTE — Assessment & Plan Note (Addendum)
Patient currently wears 3 L of supplemental oxygen at baseline in the setting of COPD, postradiation fibrosis, and chronic pleural effusions.  Currently experiencing worsening dyspnea on exertion and shortness of breath, however oxygen saturations has maintained on her home levels.  - Continue home supplemental oxygen

## 2022-08-08 ENCOUNTER — Other Ambulatory Visit: Payer: Self-pay

## 2022-08-08 ENCOUNTER — Observation Stay
Admit: 2022-08-08 | Discharge: 2022-08-08 | Disposition: A | Discharge status: Home / Self Care | Payer: Medicare Other | Attending: Internal Medicine | Admitting: Internal Medicine

## 2022-08-08 ENCOUNTER — Encounter: Payer: Self-pay | Admitting: Internal Medicine

## 2022-08-08 DIAGNOSIS — E860 Dehydration: Secondary | ICD-10-CM | POA: Diagnosis present

## 2022-08-08 DIAGNOSIS — J8410 Pulmonary fibrosis, unspecified: Secondary | ICD-10-CM | POA: Diagnosis present

## 2022-08-08 DIAGNOSIS — M1612 Unilateral primary osteoarthritis, left hip: Secondary | ICD-10-CM | POA: Diagnosis present

## 2022-08-08 DIAGNOSIS — I10 Essential (primary) hypertension: Secondary | ICD-10-CM | POA: Diagnosis present

## 2022-08-08 DIAGNOSIS — J9611 Chronic respiratory failure with hypoxia: Secondary | ICD-10-CM | POA: Diagnosis present

## 2022-08-08 DIAGNOSIS — G4730 Sleep apnea, unspecified: Secondary | ICD-10-CM | POA: Diagnosis present

## 2022-08-08 DIAGNOSIS — K219 Gastro-esophageal reflux disease without esophagitis: Secondary | ICD-10-CM | POA: Diagnosis present

## 2022-08-08 DIAGNOSIS — I3131 Malignant pericardial effusion in diseases classified elsewhere: Secondary | ICD-10-CM | POA: Diagnosis present

## 2022-08-08 DIAGNOSIS — D709 Neutropenia, unspecified: Secondary | ICD-10-CM | POA: Diagnosis not present

## 2022-08-08 DIAGNOSIS — I4719 Other supraventricular tachycardia: Secondary | ICD-10-CM | POA: Diagnosis present

## 2022-08-08 DIAGNOSIS — I471 Supraventricular tachycardia, unspecified: Secondary | ICD-10-CM

## 2022-08-08 DIAGNOSIS — J479 Bronchiectasis, uncomplicated: Secondary | ICD-10-CM | POA: Diagnosis present

## 2022-08-08 DIAGNOSIS — J4489 Other specified chronic obstructive pulmonary disease: Secondary | ICD-10-CM | POA: Diagnosis present

## 2022-08-08 DIAGNOSIS — C3412 Malignant neoplasm of upper lobe, left bronchus or lung: Secondary | ICD-10-CM | POA: Diagnosis present

## 2022-08-08 DIAGNOSIS — E871 Hypo-osmolality and hyponatremia: Secondary | ICD-10-CM | POA: Diagnosis present

## 2022-08-08 DIAGNOSIS — N179 Acute kidney failure, unspecified: Secondary | ICD-10-CM | POA: Diagnosis present

## 2022-08-08 DIAGNOSIS — J9811 Atelectasis: Secondary | ICD-10-CM | POA: Diagnosis present

## 2022-08-08 DIAGNOSIS — H8109 Meniere's disease, unspecified ear: Secondary | ICD-10-CM | POA: Diagnosis present

## 2022-08-08 DIAGNOSIS — Z96653 Presence of artificial knee joint, bilateral: Secondary | ICD-10-CM | POA: Diagnosis present

## 2022-08-08 DIAGNOSIS — E7800 Pure hypercholesterolemia, unspecified: Secondary | ICD-10-CM | POA: Diagnosis present

## 2022-08-08 DIAGNOSIS — Z9981 Dependence on supplemental oxygen: Secondary | ICD-10-CM | POA: Diagnosis not present

## 2022-08-08 DIAGNOSIS — E876 Hypokalemia: Secondary | ICD-10-CM | POA: Diagnosis present

## 2022-08-08 DIAGNOSIS — M47816 Spondylosis without myelopathy or radiculopathy, lumbar region: Secondary | ICD-10-CM | POA: Diagnosis present

## 2022-08-08 DIAGNOSIS — K589 Irritable bowel syndrome without diarrhea: Secondary | ICD-10-CM | POA: Diagnosis present

## 2022-08-08 DIAGNOSIS — J910 Malignant pleural effusion: Secondary | ICD-10-CM | POA: Diagnosis present

## 2022-08-08 LAB — ECHOCARDIOGRAM COMPLETE
AR max vel: 2.86 cm2
AV Area VTI: 2.98 cm2
AV Area mean vel: 2.72 cm2
AV Mean grad: 2 mmHg
AV Peak grad: 3.8 mmHg
Ao pk vel: 0.97 m/s
Area-P 1/2: 4.6 cm2
Calc EF: 55.4 %
Height: 61 in
S' Lateral: 2.3 cm
Single Plane A2C EF: 51.2 %
Single Plane A4C EF: 59.1 %
Weight: 2544 oz

## 2022-08-08 LAB — BASIC METABOLIC PANEL
Anion gap: 7 (ref 5–15)
BUN: 19 mg/dL (ref 8–23)
CO2: 32 mmol/L (ref 22–32)
Calcium: 8.8 mg/dL — ABNORMAL LOW (ref 8.9–10.3)
Chloride: 94 mmol/L — ABNORMAL LOW (ref 98–111)
Creatinine, Ser: 0.76 mg/dL (ref 0.44–1.00)
GFR, Estimated: >60 mL/min (ref >60)
Glucose, Bld: 96 mg/dL (ref 70–99)
Potassium: 3.6 mmol/L (ref 3.5–5.1)
Sodium: 133 mmol/L — ABNORMAL LOW (ref 135–145)

## 2022-08-08 LAB — PROTIME-INR
INR: 1.1 (ref 0.8–1.2)
Prothrombin Time: 14.3 seconds (ref 11.4–15.2)

## 2022-08-08 LAB — GLUCOSE, CAPILLARY: Glucose-Capillary: 190 mg/dL — ABNORMAL HIGH (ref 70–99)

## 2022-08-08 LAB — CBC
HCT: 27.5 % — ABNORMAL LOW (ref 36.0–46.0)
Hemoglobin: 8.9 g/dL — ABNORMAL LOW (ref 12.0–15.0)
MCH: 30 pg (ref 26.0–34.0)
MCHC: 32.4 g/dL (ref 30.0–36.0)
MCV: 92.6 fL (ref 80.0–100.0)
Platelets: 252 10*3/uL (ref 150–400)
RBC: 2.97 MIL/uL — ABNORMAL LOW (ref 3.87–5.11)
RDW: 14.9 % (ref 11.5–15.5)
WBC: 1.6 10*3/uL — ABNORMAL LOW (ref 4.0–10.5)
nRBC: 0 % (ref 0.0–0.2)

## 2022-08-08 LAB — CULTURE, BLOOD (ROUTINE X 2): Culture: NO GROWTH

## 2022-08-08 MED ORDER — ENOXAPARIN SODIUM 40 MG/0.4ML IJ SOSY
40.0000 mg | PREFILLED_SYRINGE | INTRAMUSCULAR | Status: DC
  Administered 2022-08-08: 40 mg via SUBCUTANEOUS
  Filled 2022-08-08: qty 0.4

## 2022-08-08 NOTE — Evaluation (Signed)
Occupational Therapy Evaluation Patient Details Name: Jennifer Hodges MRN: [id number] DOB: 1945-10-03 Today's Date: 08/08/2022   History of Present Illness Jennifer Hodges is a 77 y.o. 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   Clinical Impression   Jennifer Hodges was seen for OT evaluation this date. Prior to hospital admission, pt was MOD I using RW. Pt lives with significant other. Pt currently requires SUPERVISION toilet t/f no AD and standing grooming tasks - improves to MOD I + RW for ~120 ft functional mobility. IND don gown and slippers in sitting. No skilled acute OT needs identified, all education complete, will sign off. Upon hospital discharge, recommend no OT follow up.   Recommendations for follow up therapy are one component of a multi-disciplinary discharge planning process, led by the attending physician.  Recommendations may be updated based on patient status, additional functional criteria and insurance authorization.   Assistance Recommended at Discharge Set up Supervision/Assistance  Patient can return home with the following Help with stairs or ramp for entrance    Functional Status Assessment  Patient has had a recent decline in their functional status and demonstrates the ability to make significant improvements in function in a reasonable and predictable amount of time.  Equipment Recommendations  None recommended by OT    Recommendations for Other Services       Precautions / Restrictions Precautions Precautions: None Restrictions Weight Bearing Restrictions: No      Mobility Bed Mobility Overal bed  mobility: Independent                  Transfers Overall transfer level: Modified independent Equipment used: Rolling walker (2 wheels)                      Balance Overall balance assessment: No apparent balance deficits (not formally assessed)                                         ADL either performed or assessed with clinical judgement   ADL Overall ADL's : Needs assistance/impaired                                       General ADL Comments: SUPERVISION toilet t/f no AD and standing grooming tasks improves to MOD I + RW for ~120 ft functional mobility. IND don gown and slippers in sitting      Pertinent Vitals/Pain Pain Assessment Pain Assessment: No/denies pain     Hand Dominance     Extremity/Trunk Assessment Upper Extremity Assessment Upper Extremity Assessment: Overall WFL for tasks assessed   Lower Extremity Assessment Lower Extremity Assessment: Overall WFL for tasks assessed       Communication Communication Communication: No difficulties   Cognition Arousal/Alertness: Awake/alert Behavior During Therapy: WFL for tasks assessed/performed Overall Cognitive Status: Within Functional Limits for tasks assessed  Home Living Family/patient expects to be discharged to:: Private residence Living Arrangements: Spouse/significant other Available Help at Discharge: Family;Available 24 hours/day Type of Home: House Home Access: Stairs to enter [employer] of Steps: 2   Home Layout: One level     Bathroom Shower/Tub: Tub/shower unit         Home Equipment: Agricultural consultant (2 wheels);Shower seat;Grab bars - tub/shower          Prior Functioning/Environment Prior Level of Function : Independent/Modified Independent                        OT Problem List: Decreased safety awareness         OT Goals(Current  goals can be found in the care plan section) Acute Rehab OT Goals Patient Stated Goal: to go home OT Goal Formulation: With patient/family Time For Goal Achievement: 08/22/22 Potential to Achieve Goals: Good   AM-PAC OT "6 Clicks" Daily Activity     Outcome Measure Help from another person eating meals?: None Help from another person taking care of personal grooming?: None Help from another person toileting, which includes using toliet, bedpan, or urinal?: A Little Help from another person bathing (including washing, rinsing, drying)?: A Little Help from another person to put on and taking off regular upper body clothing?: None Help from another person to put on and taking off regular lower body clothing?: None 6 Click Score: 22   End of Session    Activity Tolerance: Patient tolerated treatment well Patient left: in bed;with call bell/phone within reach;with family/visitor present  OT Visit Diagnosis: Unsteadiness on feet (R26.81);Muscle weakness (generalized) (M62.81)                Time: 9604-5409 OT Time Calculation (min): 24 min Charges:  OT General Charges $OT Visit: 1 Visit OT Evaluation $OT Eval Low Complexity: 1 Low OT Treatments $Self Care/Home Management : 8-22 mins  Kathie Dike, M.S. OTR/L  08/08/22, 1:18 PM  ascom [phone]

## 2022-08-08 NOTE — Progress Notes (Signed)
PROGRESS NOTE    Jennifer Hodges  ZOX:[id number] DOB: [birth year] DOA: 08/07/2022 PCP: Marina Goodell, MD  [id number]  LOS: 0 days   Brief hospital course:   Assessment & Plan: Jennifer Hodges is a 77 y.o. 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    * SVT (supraventricular tachycardia) Patient is presenting with tachycardia with EKG consistent with SVT's.  She converted to sinus rhythm with rate around 100 after one-time dose of diltiazem.  Patient is that her heart rate has frequently been increasing up to 160 at home, so suspect this is possibly recurrent.   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   --Bedside echo with Dr. Juliann Pares with small pericardial effusion, no evidence of tamponade.  Current Echo showed Mild/Mod pericardial effusion.  AKI (acute kidney injury) (HCC) --Cr 1.02 on presentation, up from 0.45 about 1 week ago.  Likely due to aggressive diuresis in light of poor p.o. intake.   - S/p 1 L of fluids --hold diuretic for now  Pleural effusion on right History of recurrent right-sided pleural effusion  with most recent thoracentesis on 4/11.  Pleural effusion at this point is small with no access for drainage.  In the setting of AKI, holding further diuretics.  Chronic hypoxic respiratory failure on 3L home O2 Patient currently wears 3 L of supplemental oxygen at baseline in the setting of COPD, postradiation fibrosis, and chronic pleural effusions.  Currently experiencing  worsening dyspnea on exertion and shortness of breath, however oxygen saturations has maintained on her home levels.  - Continue home supplemental oxygen  Hypokalemia --monitor and replete PRN  Hyponatremia --improved with IVF  Generalized weakness Likely multifactorial in the setting of progressive and recurrent stage III adenocarcinoma of the lung, deconditioning the setting of ongoing chemotherapy, and pericardial effusion may potentially be contributing.  - PT/OT  Malignant neoplasm of upper lobe of left lung [facility]) Patient has a history of stage IIIa adenocarcinoma of the lung s/p chemoradiation, currently going chemoradiation for recurrence.  Most recent dose of chemotherapy was on 5/20.   DVT prophylaxis: Lovenox SQ Code Status: Full code  Family Communication: husband updated at bedside today Level of care: Telemetry Cardiac Dispo:   The patient is from: home Anticipated d/c is to: home Anticipated d/c date is: tomorrow   Subjective and Interval History:  Pt reported feeling better.  Pt mentioned that her "burning" in her kidneys that's been going on for months, and was told it's due to diuretics.   Objective: Vitals:   08/08/22 1400 08/08/22 1416 08/08/22 1426 08/08/22 1654  BP:   115/63 124/73  Pulse: (!) 108  95 (!) 111  Resp: 18  16 16   Temp:   97.8 F (36.6 C) 98.2 F (36.8 C)  TempSrc:      SpO2: 100%  95% 99%  Weight:  69.8 kg    Height:  5\' 1"  (1.549 m)     No intake or output data in the 24 hours ending 08/08/22 1659 Filed Weights   08/07/22 1149 08/08/22 1416  Weight: 72.1 kg 69.8 kg    Examination:  Constitutional: NAD, AAOx3 HEENT: conjunctivae and lids normal, EOMI CV: No cyanosis.   RESP: normal respiratory effort, on 3L Neuro: II - XII grossly intact.   Psych: Normal mood and affect.  Appropriate judgement and reason   Data Reviewed: I have personally reviewed labs and imaging studies  Time spent: 50 minutes  [name],  MD [facility] If 7PM-7AM, please contact night-coverage 08/08/2022, 4:59 PM

## 2022-08-08 NOTE — Progress Notes (Signed)
PT Cancellation Note  Patient Details Name: Jennifer Hodges MRN: [id number] DOB: 1945/12/14   Cancelled Treatment:    Reason Eval/Treat Not Completed: PT screened, no needs identified, will sign off. Orders received and Chart reviewed. Per OT and pt, pt at her baseline level of function. Pt declining acute PT needs. PT to sign off.    Delphia Grates. Fairly IV, PT, DPT Physical Therapist- [place]  [employer]  08/08/2022, 12:53 PM

## 2022-08-08 NOTE — Progress Notes (Signed)
Pt refused bed alarm but was educated about its importance. Will continue to monitor.  Update [id number]: Laboratory staff called and reported a critical WBC 1.4. MD Fran Lowes  and incoming shift made aware . Will continue to monitor.  Update 0645: No new order for pt. Will continue to monitor.

## 2022-08-08 NOTE — Progress Notes (Signed)
Pt arrived by wheelchair from the ED with her husband Ronnie at 30. Standing weight is 153.9 pounds.

## 2022-08-09 DIAGNOSIS — I471 Supraventricular tachycardia, unspecified: Secondary | ICD-10-CM | POA: Diagnosis not present

## 2022-08-09 LAB — CBC WITH DIFFERENTIAL/PLATELET
Abs Immature Granulocytes: 0.01 10*3/uL (ref 0.00–0.07)
Basophils Absolute: 0 10*3/uL (ref 0.0–0.1)
Basophils Relative: 1 %
Eosinophils Absolute: 0.1 10*3/uL (ref 0.0–0.5)
Eosinophils Relative: 5 %
HCT: 25.8 % — ABNORMAL LOW (ref 36.0–46.0)
Hemoglobin: 8.4 g/dL — ABNORMAL LOW (ref 12.0–15.0)
Immature Granulocytes: 1 %
Lymphocytes Relative: 29 %
Lymphs Abs: 0.4 10*3/uL — ABNORMAL LOW (ref 0.7–4.0)
MCH: 29.7 pg (ref 26.0–34.0)
MCHC: 32.6 g/dL (ref 30.0–36.0)
MCV: 91.2 fL (ref 80.0–100.0)
Monocytes Absolute: 0.4 10*3/uL (ref 0.1–1.0)
Monocytes Relative: 31 %
Neutro Abs: 0.5 10*3/uL — ABNORMAL LOW (ref 1.7–7.7)
Neutrophils Relative %: 33 %
Platelets: 218 10*3/uL (ref 150–400)
RBC: 2.83 MIL/uL — ABNORMAL LOW (ref 3.87–5.11)
RDW: 14.7 % (ref 11.5–15.5)
WBC: 1.4 10*3/uL — CL (ref 4.0–10.5)
nRBC: 0 % (ref 0.0–0.2)

## 2022-08-09 LAB — CBC
HCT: 26 % — ABNORMAL LOW (ref 36.0–46.0)
Hemoglobin: 8.5 g/dL — ABNORMAL LOW (ref 12.0–15.0)
MCH: 29.7 pg (ref 26.0–34.0)
MCHC: 32.7 g/dL (ref 30.0–36.0)
MCV: 90.9 fL (ref 80.0–100.0)
Platelets: 207 10*3/uL (ref 150–400)
RBC: 2.86 MIL/uL — ABNORMAL LOW (ref 3.87–5.11)
RDW: 14.6 % (ref 11.5–15.5)
WBC: 1.4 10*3/uL — CL (ref 4.0–10.5)
nRBC: 0 % (ref 0.0–0.2)

## 2022-08-09 LAB — BASIC METABOLIC PANEL
Anion gap: 10 (ref 5–15)
BUN: 15 mg/dL (ref 8–23)
CO2: 31 mmol/L (ref 22–32)
Calcium: 8.7 mg/dL — ABNORMAL LOW (ref 8.9–10.3)
Chloride: 94 mmol/L — ABNORMAL LOW (ref 98–111)
Creatinine, Ser: 0.56 mg/dL (ref 0.44–1.00)
GFR, Estimated: >60 mL/min (ref >60)
Glucose, Bld: 93 mg/dL (ref 70–99)
Potassium: 3.3 mmol/L — ABNORMAL LOW (ref 3.5–5.1)
Sodium: 135 mmol/L (ref 135–145)

## 2022-08-09 LAB — CULTURE, BLOOD (ROUTINE X 2): Special Requests: ADEQUATE

## 2022-08-09 LAB — MAGNESIUM: Magnesium: 2.1 mg/dL (ref 1.7–2.4)

## 2022-08-09 MED ORDER — POTASSIUM CHLORIDE CRYS ER 20 MEQ PO TBCR
40.0000 meq | EXTENDED_RELEASE_TABLET | Freq: Once | ORAL | Status: AC
  Administered 2022-08-09: 40 meq via ORAL
  Filled 2022-08-09: qty 2

## 2022-08-09 MED ORDER — METOPROLOL TARTRATE 25 MG PO TABS: 12.5000 mg | ORAL_TABLET | Freq: Two times a day (BID) | ORAL | 2 refills | Status: AC

## 2022-08-09 MED ORDER — TORSEMIDE 20 MG PO TABS: ORAL_TABLET | ORAL | Status: DC

## 2022-08-09 MED ORDER — METOPROLOL TARTRATE 25 MG PO TABS
12.5000 mg | ORAL_TABLET | Freq: Two times a day (BID) | ORAL | Status: DC
  Administered 2022-08-09: 12.5 mg via ORAL
  Filled 2022-08-09: qty 1

## 2022-08-09 MED ORDER — AMOXICILLIN-POT CLAVULANATE 875-125 MG PO TABS: 1.0000 | ORAL_TABLET | Freq: Two times a day (BID) | ORAL | 0 refills | Status: AC

## 2022-08-09 MED ORDER — AUGMENTIN 125-31.25 MG/5ML PO SUSR: 875.0000 mg | Freq: Two times a day (BID) | ORAL | 0 refills | Status: DC

## 2022-08-09 MED ORDER — SPIRONOLACTONE 50 MG PO TABS: ORAL_TABLET | ORAL | Status: DC

## 2022-08-09 MED ORDER — AMOXICILLIN-POT CLAVULANATE 875-125 MG PO TABS
1.0000 | ORAL_TABLET | Freq: Two times a day (BID) | ORAL | Status: DC
  Administered 2022-08-09: 1 via ORAL
  Filled 2022-08-09: qty 1

## 2022-08-09 NOTE — Plan of Care (Signed)
  Problem: Health Behavior/Discharge Planning: Goal: Ability to manage health-related needs will improve Outcome: Progressing   Problem: Clinical Measurements: Goal: Respiratory complications will improve Outcome: Progressing   Problem: Clinical Measurements: Goal: Cardiovascular complication will be avoided Outcome: Progressing   Problem: Activity: Goal: Risk for activity intolerance will decrease Outcome: Progressing   Problem: Nutrition: Goal: Adequate nutrition will be maintained Outcome: Progressing   Problem: Elimination: Goal: Will not experience complications related to bowel motility Outcome: Progressing   Problem: Elimination: Goal: Will not experience complications related to urinary retention Outcome: Progressing   Problem: Pain Managment: Goal: General experience of comfort will improve Outcome: Progressing   Problem: Safety: Goal: Ability to remain free from injury will improve Outcome: Progressing   

## 2022-08-09 NOTE — Consult Note (Signed)
CARDIOLOGY CONSULT NOTE               Patient ID: Jennifer Hodges MRN: [id number] DOB/AGE: 06-16-1945 77 y.o.  Admit date: 08/07/2022 Referring Physician Dr. Erin Sons spondylous Primary Physician Dr. Maryjane Hurter primary Primary Cardiologist Dr. Tressa Busman [facility]iology [facility] Reason for Consultation SVT pericardial effusion  HPI: Patient is a 77 year old female long history of adenocarcinoma of the lung stage III status post chemo with recurrence in 2023 2024 currently on ALIMTA.  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  Evidence of pericardial effusion on CT.  Patient had chronic hypoxic respiratory failure on 3 L hypertension COPD asthma bronchiectasis many years hyperlipidemia presented to emergency room with generalized weakness found to be in SVT rate of 150 given Cardizem and subsequently converted to sinus rhythm from 150 down to about 90 patient had some nausea vomiting generalized weakness no fever no chills no bleeding no chest pain.  Complains of some tachycardia and palpitations but in general just has generalized weakness and fatigue bedside echocardiogram shows small mild pericardial effusion but no evidence of tamponade formal echocardiogram was to be ordered no indication for pericardiocentesis was identified  Review of systems complete and found to be negative unless listed above     Past Medical History:  Diagnosis Date   Anemia    Asthma    No Inhalers--Dr. Meredeth Ide will order as needed   Bronchiectasis (HCC)    mild   Chronic headaches     followed by [facility] migraines   COPD (chronic obstructive pulmonary disease) (HCC)    DDD (degenerative disc disease), lumbar    Diverticulosis    Family history of adverse reaction to anesthesia    mother and sisters-PONV   Gall stones    history of   GERD (gastroesophageal reflux disease)    EGD 8/09- non bleeding erosive gastritis, documentd esophageal ulcerations.    Hiatal hernia    small    History of kidney stones    History of pneumonia    Hypercholesterolemia    IBS (irritable bowel syndrome)    Malignant neoplasm of upper lobe of left lung (HCC) 12/27/2018   Meniere disease    Murmur    Osteoarthritis    lumbar disc disease, left hip   Personal history of chemotherapy    Personal history of radiation therapy    Pneumonia 12/2020   PONV (postoperative nausea and vomiting)    Sleep apnea    uses cpap   Vertigo    Weakness of right side of body     Past Surgical History:  Procedure Laterality Date   ABDOMINAL HYSTERECTOMY  age 46   ANTERIOR CERVICAL DECOMP/DISCECTOMY FUSION N/A 02/24/2015   Procedure: CERVICAL FOUR-FIVE, CERVICAL FIVE-SIX, CERVICAL SIX-SEVEN ANTERIOR CERVICAL DECOMPRESSION/DISCECTOMY FUSION ;  Surgeon: Lisbeth Renshaw, MD;  Location: [facility];  Service: Neurosurgery;  Laterality: N/A;  C45 C56 C67 anterior cervical decompression with fusion interbody prosthesis plating and bonegraft   APPENDECTOMY     BACK SURGERY  1988   4th lumbar fusion   BLADDER SURGERY N/A    with vaginal wall repair   BREAST CYST ASPIRATION Bilateral    neg   BREAST SURGERY Bilateral    cyst removed and reduction   CARDIAC CATHETERIZATION  2014   CARPAL TUNNEL RELEASE Right 02/11/2016   Procedure: CARPAL TUNNEL RELEASE;  Surgeon: Kennedy Bucker, MD;  Location: [facility];  Service: Orthopedics;  Laterality: Right;   CATARACT EXTRACTION W/ INTRAOCULAR LENS IMPLANT Bilateral 2015  CHOLECYSTECTOMY     EUS N/A 05/31/2012   Procedure: UPPER ENDOSCOPIC ULTRASOUND (EUS) LINEAR;  Surgeon: Rachael Fee, MD;  Location: [facility];  Service: Endoscopy;  Laterality: N/A;   EXCISIONAL HEMORRHOIDECTOMY     JOINT REPLACEMENT Bilateral    KNEE ARTHROSCOPY WITH LATERAL MENISECTOMY Right 07/07/2015   Procedure: KNEE ARTHROSCOPY WITH LATERAL MENISECTOMY, PARTIAL SYNOVECTOMY;  Surgeon: Kennedy Bucker, MD;  Location: [facility];  Service: Orthopedics;  Laterality: Right;   LUMBAR  LAMINECTOMY     PORTA CATH INSERTION N/A 01/07/2019   Procedure: PORTA CATH INSERTION;  Surgeon: Annice Needy, MD;  Location: [facility];  Service: Cardiovascular;  Laterality: N/A;   REDUCTION MAMMAPLASTY  1990   RIGHT OOPHORECTOMY     TOTAL HIP ARTHROPLASTY Left 05/01/2014   Dr. Sheppard Evens   TOTAL HIP ARTHROPLASTY Right 08/05/2014   Procedure: TOTAL HIP ARTHROPLASTY ANTERIOR APPROACH;  Surgeon: Kennedy Bucker, MD;  Location: [facility];  Service: Orthopedics;  Laterality: Right;   ULNAR NERVE TRANSPOSITION Right 02/11/2016   Procedure: ULNAR NERVE DECOMPRESSION/TRANSPOSITION;  Surgeon: Kennedy Bucker, MD;  Location: [facility];  Service: Orthopedics;  Laterality: Right;   VIDEO BRONCHOSCOPY WITH ENDOBRONCHIAL ULTRASOUND Left 12/19/2018   Procedure: VIDEO BRONCHOSCOPY WITH ENDOBRONCHIAL ULTRASOUND, LEFT, SLEEP APNEA;  Surgeon: Vida Rigger, MD;  Location: [facility];  Service: Thoracic;  Laterality: Left;   VIDEO BRONCHOSCOPY WITH ENDOBRONCHIAL ULTRASOUND Right 05/14/2021   Procedure: VIDEO BRONCHOSCOPY WITH ENDOBRONCHIAL ULTRASOUND;  Surgeon: Salena Saner, MD;  Location: [facility];  Service: Pulmonary;  Laterality: Right;    Medications Prior to Admission  Medication Sig Dispense Refill Last Dose   acetaminophen (TYLENOL) 500 MG tablet Take 500 mg by mouth 2 (two) times daily.   08/07/2022 at AM   Calcium-Magnesium-Zinc (CAL-MAG-ZINC PO) Take 1 tablet by mouth daily.   Past Month   cyclobenzaprine (FLEXERIL) 10 MG tablet Take 10 mg by mouth at bedtime.   08/06/2022 at NIGHT   ezetimibe (ZETIA) 10 MG tablet Take 5 mg by mouth in the morning and at bedtime.   08/06/2022 at NIGHT   Histamine Dihydrochloride (AUSTRALIAN DREAM ARTHRITIS) 0.025 % CREA Apply 1 application  topically 4 (four) times daily as needed (pain).   08/06/2022   HYDROcodone-acetaminophen (NORCO/VICODIN) 5-325 MG tablet Take 1 tablet by mouth at bedtime. 30 tablet 0 08/06/2022 at HS   loratadine (CLARITIN) 10 MG tablet  Take 10 mg by mouth daily.   Past Month   ondansetron (ZOFRAN) 8 MG tablet Take 1 tablet (8 mg total) by mouth every 8 (eight) hours as needed for nausea or vomiting. 20 tablet 0 Past Month at PRN   pantoprazole (PROTONIX) 40 MG tablet Take 40 mg by mouth 2 (two) times daily.   08/07/2022 at 0400   Polyethyl Glyc-Propyl Glyc PF (SYSTANE HYDRATION PF) 0.4-0.3 % SOLN Place 1 drop into both eyes as needed. as   Past Week at PRN   polyethylene glycol powder (GLYCOLAX/MIRALAX) 17 GM/SCOOP powder Take 17 g by mouth daily as needed for moderate constipation.   Past Week at PRN   Sennosides (SENNA) 8.6 MG CAPS Take 1 capsule by mouth daily as needed (constipation).   Past Week at PRN   spironolactone (ALDACTONE) 50 MG tablet Take 50 mg by mouth 2 (two) times daily.   08/07/2022 at AM   torsemide (DEMADEX) 20 MG tablet Take 40 mg by mouth once.   08/07/2022 at AM   triamcinolone (NASACORT) 55 MCG/ACT AERO nasal inhaler Place 2 sprays  into the nose daily as needed.   Past Month at PRN   Wheat Dextrin (EQ FIBER POWDER PO) Take 1 Dose by mouth as needed.   Past Week at PRN   albuterol (VENTOLIN HFA) 108 (90 Base) MCG/ACT inhaler Inhale 2 puffs into the lungs every 6 (six) hours as needed for wheezing or shortness of breath. (Patient not taking: Reported on 06/20/2022)   Not Taking   Bioflavonoid Products (ESTER C PO) Take 1 tablet by mouth daily. 500 mg   08/03/2022   chlorhexidine (PERIDEX) 0.12 % solution  (Patient not taking: Reported on 06/20/2022)   Completed Course   Cholecalciferol (EQL VITAMIN D3) 25 MCG (1000 UT) capsule Take 1,000 Units by mouth daily. (Patient not taking: Reported on 07/18/2022)   Not Taking   Coenzyme Q10 (CO Q 10) 100 MG CAPS Take 100 mg by mouth daily. (Patient not taking: Reported on 07/18/2022)   Not Taking   DHEA 25 MG CAPS Take 1 capsule by mouth daily. (Patient not taking: Reported on 07/18/2022)      potassium chloride SA (KLOR-CON M) 20 MEQ tablet Take 1 tablet (20 mEq total) by mouth  daily. (Patient not taking: Reported on 08/07/2022) 7 tablet 0 Completed Course   UBRELVY 100 MG TABS Take 100 mg by mouth daily as needed (migraines).   PRN at PRN   Social History   Socioeconomic History   Marital status: Married    Spouse name: Not on file   Number of children: Not on file   Years of education: Not on file   Highest education level: Not on file  Occupational History   Not on file  Tobacco Use   Smoking status: Former    Packs/day: 1.00    Years: 40.00    Additional pack years: 0.00    Total pack years: 40.00    Types: Cigarettes    Quit date: 06/13/2007    Years since quitting: 15.1   Smokeless tobacco: Never  Vaping Use   Vaping Use: Never used  Substance and Sexual Activity   Alcohol use: No    Alcohol/week: 0.0 standard drinks of alcohol   Drug use: No   Sexual activity: Not on file  Other Topics Concern   Not on file  Social History Narrative   Not on file   Social Determinants of Health   Financial Resource Strain: Not on file  Food Insecurity: No Food Insecurity (08/08/2022)   Hunger Vital Sign    Worried About Running Out of Food in the Last Year: Never true    Ran Out of Food in the Last Year: Never true  Transportation Needs: No Transportation Needs (08/08/2022)   PRAPARE - Administrator, Civil Service (Medical): No    Lack of Transportation (Non-Medical): No  Physical Activity: Not on file  Stress: Not on file  Social Connections: Not on file  Intimate Partner Violence: Not At Risk (08/08/2022)   Humiliation, Afraid, Rape, and Kick questionnaire    Fear of Current or Ex-Partner: No    Emotionally Abused: No    Physically Abused: No    Sexually Abused: No    Family History  Problem Relation Age of Onset   Heart disease Mother        s/p stent   Hypertension Mother    Hypercholesterolemia Mother    Diabetes Father    Stomach cancer Other        uncle   Breast cancer Cousin  Breast cancer Paternal Aunt        Review of systems complete and found to be negative unless listed above      PHYSICAL EXAM  General: Well developed, well nourished, in no acute distress HEENT:  Normocephalic and atramatic Neck:  No JVD.  Lungs: Clear bilaterally to auscultation and percussion. Heart: HRRR . Normal S1 and S2 without gallops or murmurs.  Abdomen: Bowel sounds are positive, abdomen soft and non-tender  Msk:  Back normal, normal gait. Normal strength and tone for age. Extremities: No clubbing, cyanosis or edema.   Neuro: Alert and oriented X 3. Psych:  Good affect, responds appropriately  Labs:   Lab Results  Component Value Date   WBC 1.4 (LL) 08/09/2022   HGB 8.5 (L) 08/09/2022   HCT 26.0 (L) 08/09/2022   MCV 90.9 08/09/2022   PLT 207 08/09/2022    Recent Labs  Lab 08/07/22 1201 08/08/22 0536 08/09/22 0511  NA 130*   < > 135  K 3.4*   < > 3.3*  CL 88*   < > 94*  CO2 27   < > 31  BUN 24*   < > 15  CREATININE 1.02*   < > 0.56  CALCIUM 8.8*   < > 8.7*  PROT 7.3  --   --   BILITOT 0.8  --   --   ALKPHOS 90  --   --   ALT 14  --   --   AST 27  --   --   GLUCOSE 125*   < > 93   < > = values in this interval not displayed.   Lab Results  Component Value Date   CKTOTAL 48 07/25/2013   CKMB 1.1 09/22/2011   TROPONINI < 0.02 09/22/2011    Lab Results  Component Value Date   CHOL 191 11/14/2014   CHOL 171 06/02/2014   CHOL 216 (H) 11/11/2013   Lab Results  Component Value Date   HDL 49.80 11/14/2014   HDL 49.70 06/02/2014   HDL 51.90 11/11/2013   Lab Results  Component Value Date   LDLCALC 117 (H) 11/14/2014   LDLCALC 92 06/02/2014   LDLCALC 141 (H) 11/11/2013   Lab Results  Component Value Date   TRIG 120.0 11/14/2014   TRIG 145.0 06/02/2014   TRIG 114.0 11/11/2013   Lab Results  Component Value Date   CHOLHDL 4 11/14/2014   CHOLHDL 3 06/02/2014   CHOLHDL 4 11/11/2013   Lab Results  Component Value Date   LDLDIRECT 147.5 11/07/2012   LDLDIRECT 137.0  06/21/2012      Radiology: ECHOCARDIOGRAM COMPLETE  Result Date: 08/08/2022    ECHOCARDIOGRAM LIMITED REPORT   Patient Name:   Jennifer Hodges Date of Exam: 08/08/2022 Medical Rec #:  [id number]      Height:       61.0 in Accession #:    [id number]     Weight:       159.0 lb Date of Birth:  01/13/1946       BSA:          1.713 m Patient Age:    77 years       BP:           101/50 mmHg Patient Gender: F              HR:           83 bpm. Exam Location:  [facility] Procedure: 2D Echo, Color Doppler  and Cardiac Doppler Indications:     I31.3 Pericardial Effusion  History:         Patient has prior history of Echocardiogram examinations.                  Murmur; COPD and Lug CA.  Sonographer:     L. Thornton-Maynard Referring Phys:  [id number] Verdene Lennert Diagnosing Phys: Alwyn Pea MD  Sonographer Comments: Image acquisition challenging due to COPD. IMPRESSIONS  1. Mild/Mod pericardial effusion . No evidence of tamponade.  2. Left ventricular ejection fraction, by estimation, is 70 to 75%. Left ventricular ejection fraction by PLAX is 73 %. The left ventricle has hyperdynamic function. The left ventricle has no regional wall motion abnormalities. Left ventricular diastolic parameters are consistent with Grade I diastolic dysfunction (impaired relaxation).  3. Right ventricular systolic function is normal. The right ventricular size is normal.  4. Moderate pericardial effusion.  5. The mitral valve is normal in structure. No evidence of mitral valve regurgitation. FINDINGS  Left Ventricle: Left ventricular ejection fraction, by estimation, is 70 to 75%. Left ventricular ejection fraction by PLAX is 73 %. The left ventricle has hyperdynamic function. The left ventricle has no regional wall motion abnormalities. The left ventricular internal cavity size was normal in size. There is no left ventricular hypertrophy. Left ventricular diastolic parameters are consistent with Grade I diastolic dysfunction (impaired  relaxation). Right Ventricle: The right ventricular size is normal. No increase in right ventricular wall thickness. Right ventricular systolic function is normal. Left Atrium: Left atrial size was normal in size. Right Atrium: Right atrial size was normal in size. Pericardium: A moderately sized pericardial effusion is present. Mitral Valve: The mitral valve is normal in structure. Tricuspid Valve: The tricuspid valve is normal in structure. Tricuspid valve regurgitation is trivial. Aortic Valve: Aortic valve mean gradient measures 2.0 mmHg. Aortic valve peak gradient measures 3.8 mmHg. Aortic valve area, by VTI measures 2.98 cm. Pulmonic Valve: The pulmonic valve was normal in structure. Pulmonic valve regurgitation is not visualized. Aorta: The ascending aorta was not well visualized. Additional Comments: Mild/Mod pericardial effusion . No evidence of tamponade.  LEFT VENTRICLE PLAX 2D LV EF:         Left            Diastology                ventricular     LV e' medial:    5.55 cm/s                ejection        LV E/e' medial:  14.8                fraction by     LV e' lateral:   7.62 cm/s                PLAX is 73      LV E/e' lateral: 10.8                %. LVIDd:         3.90 cm LVIDs:         2.30 cm LV PW:         1.00 cm LV IVS:        1.10 cm LVOT diam:     2.00 cm LV SV:         45 LV SV Index:   26 LVOT Area:     3.14  cm  LV Volumes (MOD) LV vol d, MOD    28.7 ml A2C: LV vol d, MOD    34.7 ml A4C: LV vol s, MOD    14.0 ml A2C: LV vol s, MOD    14.2 ml A4C: LV SV MOD A2C:   14.7 ml LV SV MOD A4C:   34.7 ml LV SV MOD BP:    17.5 ml RIGHT VENTRICLE RV S prime:     8.27 cm/s TAPSE (M-mode): 2.6 cm LEFT ATRIUM           Index        RIGHT ATRIUM           Index LA diam:      3.50 cm 2.04 cm/m   RA Area:     10.70 cm LA Vol (A4C): 24.9 ml 14.53 ml/m  RA Volume:   20.00 ml  11.67 ml/m  AORTIC VALVE                    PULMONIC VALVE AV Area (Vmax):    2.86 cm     PV Vmax:          0.57 m/s AV Area  (Vmean):   2.72 cm     PV Peak grad:     1.3 mmHg AV Area (VTI):     2.98 cm     PR End Diast Vel: 2.26 msec AV Vmax:           97.20 cm/s AV Vmean:          66.000 cm/s AV VTI:            0.152 m AV Peak Grad:      3.8 mmHg AV Mean Grad:      2.0 mmHg LVOT Vmax:         88.60 cm/s LVOT Vmean:        57.100 cm/s LVOT VTI:          0.144 m LVOT/AV VTI ratio: 0.95  AORTA Ao Root diam: 3.20 cm Ao Asc diam:  3.60 cm MITRAL VALVE MV Area (PHT): 4.60 cm    SHUNTS MV Decel Time: 165 msec    Systemic VTI:  0.14 m MV E velocity: 82.30 cm/s  Systemic Diam: 2.00 cm MV A velocity: 94.30 cm/s MV E/A ratio:  0.87 Tru Rana D Trice Aspinall MD Electronically signed by Alwyn Pea MD Signature Date/Time: 08/08/2022/11:46:15 AM    Final    US Venous Img Lower Unilateral Left (DVT)  Result Date: 08/07/2022 CLINICAL DATA:  Left lower extremity edema. EXAM: LEFT LOWER EXTREMITY VENOUS DOPPLER ULTRASOUND TECHNIQUE: Gray-scale sonography with compression, as well as color and duplex ultrasound, were performed to evaluate the deep venous system(s) from the level of the common femoral vein through the popliteal and proximal calf veins. COMPARISON:  None Available. FINDINGS: VENOUS Normal compressibility of the common femoral, superficial femoral, and popliteal veins, as well as the visualized calf veins. Visualized portions of profunda femoral vein and great saphenous vein unremarkable. No filling defects to suggest DVT on grayscale or color Doppler imaging. Doppler waveforms show normal direction of venous flow, normal respiratory plasticity and response to augmentation. Limited views of the contralateral common femoral vein are unremarkable. OTHER Fluid collection in the left popliteal fossa measures 2.5 by 0.9 x 2.6 cm. Limitations: None IMPRESSION: 1. No evidence for left lower extremity DVT. 2. Small left Baker's cyst. Electronically Signed   By: Veronda Prude.D.  On: 08/07/2022 18:01   CT Angio Chest PE W and/or Wo  Contrast  Result Date: 08/07/2022 CLINICAL DATA:  PE suspected lung cancer * Tracking Code: BO * EXAM: CT ANGIOGRAPHY CHEST WITH CONTRAST TECHNIQUE: Multidetector CT imaging of the chest was performed using the standard protocol during bolus administration of intravenous contrast. Multiplanar CT image reconstructions and MIPs were obtained to evaluate the vascular anatomy. RADIATION DOSE REDUCTION: This exam was performed according to the departmental dose-optimization program which includes automated exposure control, adjustment of the mA and/or kV according to patient size and/or use of iterative reconstruction technique. CONTRAST:  75mL OMNIPAQUE IOHEXOL 350 MG/ML SOLN COMPARISON:  PET-CT, 05/30/2022 FINDINGS: Cardiovascular: Satisfactory opacification of the pulmonary arteries to the segmental level. No evidence of pulmonary embolism. Cardiomegaly. Left and right coronary artery calcifications. Moderate pericardial effusion, increased compared to prior examination. Right chest port catheter. Aortic atherosclerosis Mediastinum/Nodes: Unchanged matted soft tissue throughout the mediastinum and hila without discretely enlarged lymph nodes. Thyroid gland, trachea, and esophagus demonstrate no significant findings. Lungs/Pleura: Small right pleural effusion and associated atelectasis or consolidation, improved compared to prior examination. Similar post treatment appearance of the chest, with dense paramedian perihilar consolidation fibrosis of the lungs, with some interval evolution of radiation fibrosis. Moderate underlying emphysema. Upper Abdomen: No acute abnormality. Musculoskeletal: No chest wall abnormality. No acute osseous findings. Review of the MIP images confirms the above findings. IMPRESSION: 1. Negative examination for pulmonary embolism. 2. Similar post treatment appearance of the chest, with dense paramedian perihilar consolidation fibrosis of the lungs, with some interval evolution of radiation  fibrosis. 3. Small right pleural effusion and associated atelectasis or consolidation, improved compared to prior examination. 4. Unchanged matted soft tissue throughout the mediastinum and hila without discretely enlarged lymph nodes. 5. Cardiomegaly and coronary artery disease. 6. Moderate pericardial effusion, increased compared to prior examination. Aortic Atherosclerosis (ICD10-I70.0). Electronically Signed   By: Jearld Lesch M.D.   On: 08/07/2022 14:58   DG Chest Port 1 View  Result Date: 08/07/2022 CLINICAL DATA:  [id number] SOB (shortness of breath) [id number] EXAM: PORTABLE CHEST - 1 VIEW COMPARISON:  07/18/2022 FINDINGS: Persistent moderate right pleural effusion with adjacent opacities at the right lung base. Coarse non anatomic opacities at the right hilum slightly more conspicuous. Right IJ port catheter stable, tip distal SVC. Coarse left perihilar interstitial opacities. Heart size and mediastinal contours are within normal limits. Aortic Atherosclerosis (ICD10-170.0). No pneumothorax. Cervical fixation hardware partially visualized. IMPRESSION: Persistent moderate right pleural effusion with adjacent right lower lung atelectasis/infiltrate. Electronically Signed   By: Corlis Leak M.D.   On: 08/07/2022 12:25   DG Chest 2 View  Result Date: 07/18/2022 CLINICAL DATA:  shortness of breath; pleural effusion EXAM: CHEST - 2 VIEW COMPARISON:  06/23/2022 FINDINGS: Chest port catheter tip overlies the distal SVC. Unchanged cardiomediastinal silhouette. There is a persistent small right pleural effusion with adjacent masslike architectural distortion, fibrosis, and volume loss, similar to prior exam. Trace left pleural effusion, similar to prior, with adjacent left basilar atelectasis/scarring. No new airspace disease. No evidence of pneumothorax. No acute osseous abnormality. Partially visualized cervical spine fusion hardware. IMPRESSION: Unchanged small right pleural effusion and adjacent posttreatment  pulmonary parenchymal changes. Unchanged trace left pleural effusion. No new airspace disease. Electronically Signed   By: Caprice Renshaw M.D.   On: 07/18/2022 11:04    EKG: SVT rate of 150 narrow complex converted to sinus  ASSESSMENT AND PLAN:  SVT narrow complex Adenocarcinoma of the lung stage III Status postchemotherapy  recurrent skin 2823 2024 on Alimta Malignant effusion Pericardial effusion Chronic hypoxic respiratory failure on 3 L Hypertension COPD asthma Mild bronchiectasis Hyperlipidemia Generalized weakness . Plan Will admit to telemetry follow-up EKGs troponins Echocardiogram for evaluation of pericardial effusion rule out tamponade Supplemental oxygen therapy for shortness of breath dyspnea COPD Inhalers as necessary for COPD asthma symptoms Maintain supplemental oxygen therapy for hypoxemia Recommend oncology input for malignant stage III adenocarcinoma with probable malignant pleural effusion Continue lipid management Advance p.o. intake for poor appetite Denies any significant pain continue conservative management Do not recommend pericardiocentesis for what I consider to be a small mild pericardial effusion with no evidence of tamponade   Signed: Alwyn Pea MD 08/09/2022, 7:51 AM

## 2022-08-09 NOTE — Plan of Care (Signed)

## 2022-08-09 NOTE — Progress Notes (Signed)
[facility] CLINIC CARDIOLOGY CONSULT NOTE       Patient ID: Jennifer Hodges MRN: [id number] DOB/AGE: 08-09-1945 77 y.o.  Admit date: 08/07/2022 Referring Physician Dr. Verdene Lennert Primary Physician Laren Everts, NP  Primary Cardiologist Dr. Tiajuana Amass (previous Dr. 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  CT chest showed moderate pericardial effusion, mild to moderate by echocardiogram without tamponade physiology.  The patient also developed paroxysmal supraventricular tachycardia, which terminated after IV diltiazem x 1.  Interval history: -Feels "the best she has in days" today -No chest pain, shortness of breath, or wheezing. -No heart racing or palpitations -In sinus rhythm on telemetry without evidence of recurrent SVT or other arrhythmias   Review of systems complete and found to be negative unless listed above     Past Medical History:  Diagnosis Date   Anemia    Asthma    No Inhalers--Dr. Meredeth Ide will order as needed   Bronchiectasis (HCC)    mild   Chronic headaches     followed by [facility] migraines   COPD (chronic obstructive pulmonary disease) (HCC)    DDD (degenerative disc disease), lumbar    Diverticulosis    Family history of adverse reaction to anesthesia    mother and sisters-PONV   Gall stones    history of   GERD (gastroesophageal reflux disease)    EGD 8/09- non bleeding erosive gastritis, documentd esophageal ulcerations.    Hiatal hernia    small   History of kidney stones    History of pneumonia    Hypercholesterolemia    IBS (irritable bowel syndrome)    Malignant neoplasm of upper lobe of left lung (HCC) 12/27/2018   Meniere disease     Murmur    Osteoarthritis    lumbar disc disease, left hip   Personal history of chemotherapy    Personal history of radiation therapy    Pneumonia 12/2020   PONV (postoperative nausea and vomiting)    Sleep apnea    uses cpap   Vertigo    Weakness of right side of body     Past Surgical History:  Procedure Laterality Date   ABDOMINAL HYSTERECTOMY  age 28   ANTERIOR CERVICAL DECOMP/DISCECTOMY FUSION N/A 02/24/2015   Procedure: CERVICAL FOUR-FIVE, CERVICAL FIVE-SIX, CERVICAL SIX-SEVEN ANTERIOR CERVICAL DECOMPRESSION/DISCECTOMY FUSION ;  Surgeon: Lisbeth Renshaw, MD;  Location: [facility];  Service: Neurosurgery;  Laterality: N/A;  C45 C56 C67 anterior cervical decompression with fusion interbody prosthesis plating and bonegraft   APPENDECTOMY     BACK SURGERY  1988   4th lumbar fusion   BLADDER SURGERY N/A    with vaginal wall repair   BREAST CYST ASPIRATION Bilateral    neg   BREAST SURGERY Bilateral    cyst removed and reduction   CARDIAC CATHETERIZATION  2014   CARPAL TUNNEL RELEASE Right 02/11/2016   Procedure: CARPAL TUNNEL RELEASE;  Surgeon: Kennedy Bucker, MD;  Location: [facility] ORS;  Service: Orthopedics;  Laterality: Right;   CATARACT EXTRACTION W/ INTRAOCULAR LENS IMPLANT Bilateral 2015   CHOLECYSTECTOMY     EUS N/A 05/31/2012   Procedure: UPPER ENDOSCOPIC ULTRASOUND (EUS) LINEAR;  Surgeon: Rachael Fee, MD;  Location: [facility] ENDOSCOPY;  Service: Endoscopy;  Laterality: N/A;   EXCISIONAL HEMORRHOIDECTOMY     JOINT REPLACEMENT Bilateral    KNEE ARTHROSCOPY WITH LATERAL MENISECTOMY Right 07/07/2015   Procedure: KNEE ARTHROSCOPY WITH LATERAL MENISECTOMY, PARTIAL SYNOVECTOMY;  Surgeon: Kennedy Bucker, MD;  Location: [facility] ORS;  Service: Orthopedics;  Laterality: Right;   LUMBAR LAMINECTOMY     PORTA CATH INSERTION N/A 01/07/2019   Procedure: PORTA CATH INSERTION;  Surgeon: Annice Needy, MD;  Location: [facility] INVASIVE CV LAB;  Service: Cardiovascular;  Laterality: N/A;    REDUCTION MAMMAPLASTY  1990   RIGHT OOPHORECTOMY     TOTAL HIP ARTHROPLASTY Left 05/01/2014   Dr. Sheppard Evens   TOTAL HIP ARTHROPLASTY Right 08/05/2014   Procedure: TOTAL HIP ARTHROPLASTY ANTERIOR APPROACH;  Surgeon: Kennedy Bucker, MD;  Location: [facility] ORS;  Service: Orthopedics;  Laterality: Right;   ULNAR NERVE TRANSPOSITION Right 02/11/2016   Procedure: ULNAR NERVE DECOMPRESSION/TRANSPOSITION;  Surgeon: Kennedy Bucker, MD;  Location: [facility] ORS;  Service: Orthopedics;  Laterality: Right;   VIDEO BRONCHOSCOPY WITH ENDOBRONCHIAL ULTRASOUND Left 12/19/2018   Procedure: VIDEO BRONCHOSCOPY WITH ENDOBRONCHIAL ULTRASOUND, LEFT, SLEEP APNEA;  Surgeon: Vida Rigger, MD;  Location: [facility] ORS;  Service: Thoracic;  Laterality: Left;   VIDEO BRONCHOSCOPY WITH ENDOBRONCHIAL ULTRASOUND Right 05/14/2021   Procedure: VIDEO BRONCHOSCOPY WITH ENDOBRONCHIAL ULTRASOUND;  Surgeon: Salena Saner, MD;  Location: [facility] ORS;  Service: Pulmonary;  Laterality: Right;    Medications Prior to Admission  Medication Sig Dispense Refill Last Dose   acetaminophen (TYLENOL) 500 MG tablet Take 500 mg by mouth 2 (two) times daily.   08/07/2022 at AM   Calcium-Magnesium-Zinc (CAL-MAG-ZINC PO) Take 1 tablet by mouth daily.   Past Month   cyclobenzaprine (FLEXERIL) 10 MG tablet Take 10 mg by mouth at bedtime.   08/06/2022 at NIGHT   ezetimibe (ZETIA) 10 MG tablet Take 5 mg by mouth in the morning and at bedtime.   08/06/2022 at NIGHT   Histamine Dihydrochloride (AUSTRALIAN DREAM ARTHRITIS) 0.025 % CREA Apply 1 application  topically 4 (four) times daily as needed (pain).   08/06/2022   HYDROcodone-acetaminophen (NORCO/VICODIN) 5-325 MG tablet Take 1 tablet by mouth at bedtime. 30 tablet 0 08/06/2022 at HS   loratadine (CLARITIN) 10 MG tablet Take 10 mg by mouth daily.   Past Month   ondansetron (ZOFRAN) 8 MG tablet Take 1 tablet (8 mg total) by mouth every 8 (eight) hours as needed for nausea or vomiting. 20 tablet 0 Past Month at PRN    pantoprazole (PROTONIX) 40 MG tablet Take 40 mg by mouth 2 (two) times daily.   08/07/2022 at 0400   Polyethyl Glyc-Propyl Glyc PF (SYSTANE HYDRATION PF) 0.4-0.3 % SOLN Place 1 drop into both eyes as needed. as   Past Week at PRN   polyethylene glycol powder (GLYCOLAX/MIRALAX) 17 GM/SCOOP powder Take 17 g by mouth daily as needed for moderate constipation.   Past Week at PRN   Sennosides (SENNA) 8.6 MG CAPS Take 1 capsule by mouth daily as needed (constipation).   Past Week at PRN   triamcinolone (NASACORT) 55 MCG/ACT AERO nasal inhaler Place 2 sprays into the nose daily as needed.   Past Month at PRN   Wheat Dextrin (EQ FIBER POWDER PO) Take 1 Dose by mouth as needed.   Past Week at PRN   [DISCONTINUED] spironolactone (ALDACTONE) 50 MG tablet Take 50 mg by mouth 2 (two) times daily.   08/07/2022 at AM   [DISCONTINUED] torsemide (DEMADEX) 20 MG tablet Take 40 mg by mouth once.  08/07/2022 at AM   albuterol (VENTOLIN HFA) 108 (90 Base) MCG/ACT inhaler Inhale 2 puffs into the lungs every 6 (six) hours as needed for wheezing or shortness of breath. (Patient not taking: Reported on 06/20/2022)   Not Taking   Bioflavonoid Products (ESTER C PO) Take 1 tablet by mouth daily. 500 mg   08/03/2022   chlorhexidine (PERIDEX) 0.12 % solution  (Patient not taking: Reported on 06/20/2022)   Completed Course   Cholecalciferol (EQL VITAMIN D3) 25 MCG (1000 UT) capsule Take 1,000 Units by mouth daily. (Patient not taking: Reported on 07/18/2022)   Not Taking   Coenzyme Q10 (CO Q 10) 100 MG CAPS Take 100 mg by mouth daily. (Patient not taking: Reported on 07/18/2022)   Not Taking   DHEA 25 MG CAPS Take 1 capsule by mouth daily. (Patient not taking: Reported on 07/18/2022)      potassium chloride SA (KLOR-CON M) 20 MEQ tablet Take 1 tablet (20 mEq total) by mouth daily. (Patient not taking: Reported on 08/07/2022) 7 tablet 0 Completed Course   UBRELVY 100 MG TABS Take 100 mg by mouth daily as needed (migraines).   PRN at PRN    Social History   Socioeconomic History   Marital status: Married    Spouse name: Not on file   Number of children: Not on file   Years of education: Not on file   Highest education level: Not on file  Occupational History   Not on file  Tobacco Use   Smoking status: Former    Packs/day: 1.00    Years: 40.00    Additional pack years: 0.00    Total pack years: 40.00    Types: Cigarettes    Quit date: 06/13/2007    Years since quitting: 15.1   Smokeless tobacco: Never  Vaping Use   Vaping Use: Never used  Substance and Sexual Activity   Alcohol use: No    Alcohol/week: 0.0 standard drinks of alcohol   Drug use: No   Sexual activity: Not on file  Other Topics Concern   Not on file  Social History Narrative   Not on file   Social Determinants of Health   Financial Resource Strain: Not on file  Food Insecurity: No Food Insecurity (08/08/2022)   Hunger Vital Sign    Worried About Running Out of Food in the Last Year: Never true    Ran Out of Food in the Last Year: Never true  Transportation Needs: No Transportation Needs (08/08/2022)   PRAPARE - Administrator, Civil Service (Medical): No    Lack of Transportation (Non-Medical): No  Physical Activity: Not on file  Stress: Not on file  Social Connections: Not on file  Intimate Partner Violence: Not At Risk (08/08/2022)   Humiliation, Afraid, Rape, and Kick questionnaire    Fear of Current or Ex-Partner: No    Emotionally Abused: No    Physically Abused: No    Sexually Abused: No    Family History  Problem Relation Age of Onset   Heart disease Mother        s/p stent   Hypertension Mother    Hypercholesterolemia Mother    Diabetes Father    Stomach cancer Other        uncle   Breast cancer Cousin    Breast cancer Paternal Aunt       Intake/Output Summary (Last 24 hours) at 08/09/2022 1223 Last data filed at 08/09/2022 1010 Gross per 24 hour  Intake 840 ml  Output --  Net 840 ml    Vitals:    08/08/22 1958 08/09/22 0021 08/09/22 0351 08/09/22 0836  BP: 102/64 122/73 (!) 106/58 (!) 112/58  Pulse: (!) 103 (!) 105 91 93  Resp: 18 18 18 14   Temp: 97.7 F (36.5 C) 98 F (36.7 C) 97.8 F (36.6 C) 98.3 F (36.8 C)  TempSrc: Axillary     SpO2: 100% 98% 100% 100%  Weight:      Height:        PHYSICAL EXAM General: Elderly Caucasian female, well nourished, in no acute distress.  Sitting upright in chair with husband present. HEENT:  Normocephalic and atraumatic. Neck:  No JVD.  Lungs: Some conversational dyspnea on room air.  Decreased breath sounds throughout without appreciable crackles or wheezes.   Heart: HRRR . Normal S1 and S2 without gallops or murmurs.  Abdomen: Non-distended appearing.  Msk: Normal strength and tone for age. Extremities: Warm and well perfused. No clubbing, cyanosis.  No peripheral edema.  Neuro: Alert and oriented X 3. Psych:  Answers questions appropriately.   Labs: Basic Metabolic Panel: Recent Labs    08/08/22 0536 08/09/22 0511  NA 133* 135  K 3.6 3.3*  CL 94* 94*  CO2 32 31  GLUCOSE 96 93  BUN 19 15  CREATININE 0.76 0.56  CALCIUM 8.8* 8.7*  MG  --  2.1   Liver Function Tests: Recent Labs    08/07/22 1201  AST 27  ALT 14  ALKPHOS 90  BILITOT 0.8  PROT 7.3  ALBUMIN 3.9   No results for input(s): "LIPASE", "AMYLASE" in the last 72 hours. CBC: Recent Labs    08/07/22 1201 08/08/22 0536 08/09/22 0511  WBC 4.0 1.6* 1.4*  1.4*  NEUTROABS 3.0  --  0.5*  HGB 11.4* 8.9* 8.4*  8.5*  HCT 34.3* 27.5* 25.8*  26.0*  MCV 90.0 92.6 91.2  90.9  PLT 376 252 218  207   Cardiac Enzymes: Recent Labs    08/07/22 1201  TROPONINIHS 10   BNP: Recent Labs    08/07/22 1201  BNP 354.9*   D-Dimer: No results for input(s): "DDIMER" in the last 72 hours. Hemoglobin A1C: No results for input(s): "HGBA1C" in the last 72 hours. Fasting Lipid Panel: No results for input(s): "CHOL", "HDL", "LDLCALC", "TRIG", "CHOLHDL", "LDLDIRECT"  in the last 72 hours. Thyroid Function Tests: Recent Labs    08/07/22 1201  TSH 3.044   Anemia Panel: No results for input(s): "VITAMINB12", "FOLATE", "FERRITIN", "TIBC", "IRON", "RETICCTPCT" in the last 72 hours.   Radiology: ECHOCARDIOGRAM COMPLETE  Result Date: 08/08/2022    ECHOCARDIOGRAM LIMITED REPORT   Patient Name:   EMMILYNN GARLINGER Date of Exam: 08/08/2022 Medical Rec #:  [id number]      Height:       61.0 in Accession #:    [id number]     Weight:       159.0 lb Date of Birth:  1946/01/03       BSA:          1.713 m Patient Age:    77 years       BP:           101/50 mmHg Patient Gender: F              HR:           83 bpm. Exam Location:  [facility] Procedure: 2D Echo, Color Doppler and Cardiac Doppler Indications:  I31.3 Pericardial Effusion  History:         Patient has prior history of Echocardiogram examinations.                  Murmur; COPD and Lug CA.  Sonographer:     L. Thornton-Maynard Referring Phys:  [id number] Verdene Lennert Diagnosing Phys: Alwyn Pea MD  Sonographer Comments: Image acquisition challenging due to COPD. IMPRESSIONS  1. Mild/Mod pericardial effusion . No evidence of tamponade.  2. Left ventricular ejection fraction, by estimation, is 70 to 75%. Left ventricular ejection fraction by PLAX is 73 %. The left ventricle has hyperdynamic function. The left ventricle has no regional wall motion abnormalities. Left ventricular diastolic parameters are consistent with Grade I diastolic dysfunction (impaired relaxation).  3. Right ventricular systolic function is normal. The right ventricular size is normal.  4. Moderate pericardial effusion.  5. The mitral valve is normal in structure. No evidence of mitral valve regurgitation. FINDINGS  Left Ventricle: Left ventricular ejection fraction, by estimation, is 70 to 75%. Left ventricular ejection fraction by PLAX is 73 %. The left ventricle has hyperdynamic function. The left ventricle has no regional wall motion abnormalities. The  left ventricular internal cavity size was normal in size. There is no left ventricular hypertrophy. Left ventricular diastolic parameters are consistent with Grade I diastolic dysfunction (impaired relaxation). Right Ventricle: The right ventricular size is normal. No increase in right ventricular wall thickness. Right ventricular systolic function is normal. Left Atrium: Left atrial size was normal in size. Right Atrium: Right atrial size was normal in size. Pericardium: A moderately sized pericardial effusion is present. Mitral Valve: The mitral valve is normal in structure. Tricuspid Valve: The tricuspid valve is normal in structure. Tricuspid valve regurgitation is trivial. Aortic Valve: Aortic valve mean gradient measures 2.0 mmHg. Aortic valve peak gradient measures 3.8 mmHg. Aortic valve area, by VTI measures 2.98 cm. Pulmonic Valve: The pulmonic valve was normal in structure. Pulmonic valve regurgitation is not visualized. Aorta: The ascending aorta was not well visualized. Additional Comments: Mild/Mod pericardial effusion . No evidence of tamponade.  LEFT VENTRICLE PLAX 2D LV EF:         Left            Diastology                ventricular     LV e' medial:    5.55 cm/s                ejection        LV E/e' medial:  14.8                fraction by     LV e' lateral:   7.62 cm/s                PLAX is 73      LV E/e' lateral: 10.8                %. LVIDd:         3.90 cm LVIDs:         2.30 cm LV PW:         1.00 cm LV IVS:        1.10 cm LVOT diam:     2.00 cm LV SV:         45 LV SV Index:   26 LVOT Area:     3.14 cm  LV Volumes (MOD) LV vol d,  MOD    28.7 ml A2C: LV vol d, MOD    34.7 ml A4C: LV vol s, MOD    14.0 ml A2C: LV vol s, MOD    14.2 ml A4C: LV SV MOD A2C:   14.7 ml LV SV MOD A4C:   34.7 ml LV SV MOD BP:    17.5 ml RIGHT VENTRICLE RV S prime:     8.27 cm/s TAPSE (M-mode): 2.6 cm LEFT ATRIUM           Index        RIGHT ATRIUM           Index LA diam:      3.50 cm 2.04 cm/m   RA Area:      10.70 cm LA Vol (A4C): 24.9 ml 14.53 ml/m  RA Volume:   20.00 ml  11.67 ml/m  AORTIC VALVE                    PULMONIC VALVE AV Area (Vmax):    2.86 cm     PV Vmax:          0.57 m/s AV Area (Vmean):   2.72 cm     PV Peak grad:     1.3 mmHg AV Area (VTI):     2.98 cm     PR End Diast Vel: 2.26 msec AV Vmax:           97.20 cm/s AV Vmean:          66.000 cm/s AV VTI:            0.152 m AV Peak Grad:      3.8 mmHg AV Mean Grad:      2.0 mmHg LVOT Vmax:         88.60 cm/s LVOT Vmean:        57.100 cm/s LVOT VTI:          0.144 m LVOT/AV VTI ratio: 0.95  AORTA Ao Root diam: 3.20 cm Ao Asc diam:  3.60 cm MITRAL VALVE MV Area (PHT): 4.60 cm    SHUNTS MV Decel Time: 165 msec    Systemic VTI:  0.14 m MV E velocity: 82.30 cm/s  Systemic Diam: 2.00 cm MV A velocity: 94.30 cm/s MV E/A ratio:  0.87 Dwayne D Callwood MD Electronically signed by Alwyn Pea MD Signature Date/Time: 08/08/2022/11:46:15 AM    Final    US Venous Img Lower Unilateral Left (DVT)  Result Date: 08/07/2022 CLINICAL DATA:  Left lower extremity edema. EXAM: LEFT LOWER EXTREMITY VENOUS DOPPLER ULTRASOUND TECHNIQUE: Gray-scale sonography with compression, as well as color and duplex ultrasound, were performed to evaluate the deep venous system(s) from the level of the common femoral vein through the popliteal and proximal calf veins. COMPARISON:  None Available. FINDINGS: VENOUS Normal compressibility of the common femoral, superficial femoral, and popliteal veins, as well as the visualized calf veins. Visualized portions of profunda femoral vein and great saphenous vein unremarkable. No filling defects to suggest DVT on grayscale or color Doppler imaging. Doppler waveforms show normal direction of venous flow, normal respiratory plasticity and response to augmentation. Limited views of the contralateral common femoral vein are unremarkable. OTHER Fluid collection in the left popliteal fossa measures 2.5 by 0.9 x 2.6 cm. Limitations: None  IMPRESSION: 1. No evidence for left lower extremity DVT. 2. Small left Baker's cyst. Electronically Signed   By: Signa Kell M.D.   On: 08/07/2022 18:01   CT Angio  Chest PE W and/or Wo Contrast  Result Date: 08/07/2022 CLINICAL DATA:  PE suspected lung cancer * Tracking Code: BO * EXAM: CT ANGIOGRAPHY CHEST WITH CONTRAST TECHNIQUE: Multidetector CT imaging of the chest was performed using the standard protocol during bolus administration of intravenous contrast. Multiplanar CT image reconstructions and MIPs were obtained to evaluate the vascular anatomy. RADIATION DOSE REDUCTION: This exam was performed according to the departmental dose-optimization program which includes automated exposure control, adjustment of the mA and/or kV according to patient size and/or use of iterative reconstruction technique. CONTRAST:  75mL OMNIPAQUE IOHEXOL 350 MG/ML SOLN COMPARISON:  PET-CT, 05/30/2022 FINDINGS: Cardiovascular: Satisfactory opacification of the pulmonary arteries to the segmental level. No evidence of pulmonary embolism. Cardiomegaly. Left and right coronary artery calcifications. Moderate pericardial effusion, increased compared to prior examination. Right chest port catheter. Aortic atherosclerosis Mediastinum/Nodes: Unchanged matted soft tissue throughout the mediastinum and hila without discretely enlarged lymph nodes. Thyroid gland, trachea, and esophagus demonstrate no significant findings. Lungs/Pleura: Small right pleural effusion and associated atelectasis or consolidation, improved compared to prior examination. Similar post treatment appearance of the chest, with dense paramedian perihilar consolidation fibrosis of the lungs, with some interval evolution of radiation fibrosis. Moderate underlying emphysema. Upper Abdomen: No acute abnormality. Musculoskeletal: No chest wall abnormality. No acute osseous findings. Review of the MIP images confirms the above findings. IMPRESSION: 1. Negative  examination for pulmonary embolism. 2. Similar post treatment appearance of the chest, with dense paramedian perihilar consolidation fibrosis of the lungs, with some interval evolution of radiation fibrosis. 3. Small right pleural effusion and associated atelectasis or consolidation, improved compared to prior examination. 4. Unchanged matted soft tissue throughout the mediastinum and hila without discretely enlarged lymph nodes. 5. Cardiomegaly and coronary artery disease. 6. Moderate pericardial effusion, increased compared to prior examination. Aortic Atherosclerosis (ICD10-I70.0). Electronically Signed   By: Jearld Lesch M.D.   On: 08/07/2022 14:58   DG Chest Port 1 View  Result Date: 08/07/2022 CLINICAL DATA:  [id number] SOB (shortness of breath) [id number] EXAM: PORTABLE CHEST - 1 VIEW COMPARISON:  07/18/2022 FINDINGS: Persistent moderate right pleural effusion with adjacent opacities at the right lung base. Coarse non anatomic opacities at the right hilum slightly more conspicuous. Right IJ port catheter stable, tip distal SVC. Coarse left perihilar interstitial opacities. Heart size and mediastinal contours are within normal limits. Aortic Atherosclerosis (ICD10-170.0). No pneumothorax. Cervical fixation hardware partially visualized. IMPRESSION: Persistent moderate right pleural effusion with adjacent right lower lung atelectasis/infiltrate. Electronically Signed   By: Corlis Leak M.D.   On: 08/07/2022 12:25   DG Chest 2 View  Result Date: 07/18/2022 CLINICAL DATA:  shortness of breath; pleural effusion EXAM: CHEST - 2 VIEW COMPARISON:  06/23/2022 FINDINGS: Chest port catheter tip overlies the distal SVC. Unchanged cardiomediastinal silhouette. There is a persistent small right pleural effusion with adjacent masslike architectural distortion, fibrosis, and volume loss, similar to prior exam. Trace left pleural effusion, similar to prior, with adjacent left basilar atelectasis/scarring. No new airspace  disease. No evidence of pneumothorax. No acute osseous abnormality. Partially visualized cervical spine fusion hardware. IMPRESSION: Unchanged small right pleural effusion and adjacent posttreatment pulmonary parenchymal changes. Unchanged trace left pleural effusion. No new airspace disease. Electronically Signed   By: Caprice Renshaw M.D.   On: 07/18/2022 11:04      TELEMETRY reviewed by me (LT) 08/09/2022 : NSR rate 80s to 90s  EKG reviewed by me: SVT rate 153  Data reviewed by me (LT) 08/09/2022: Hospitalist progress note, ED note  last 24h vitals tele labs imaging I/O   Principal Problem:   SVT (supraventricular tachycardia) Active Problems:   Malignant neoplasm of upper lobe of left lung (HCC)   Pleural effusion on right   Pericardial effusion   Generalized weakness   AKI (acute kidney injury) (HCC)   Electrolyte abnormality   Chronic hypoxic respiratory failure (HCC)    ASSESSMENT AND PLAN:  Charlot Boepple is a 70yoF with a PMH of stage III adenocarcinoma of the lung s/p chemo and XRT with recurrence (currently on Alimta), current pleural effusion, small pericardial effusion, hyperlipidemia, HTN who presented to [facility] ED 08/07/2022 with generalized weakness.  CT chest showed moderate pericardial effusion, mild to moderate by echocardiogram without tamponade physiology.  The patient also developed paroxysmal supraventricular tachycardia, which terminated after IV diltiazem x 1.  # Adenocarcinoma of the lung -Agree with current therapy per primary team and oncology  # Paroxysmal SVT Remains in NSR on telemetry, without recurrence of SVT. -Continue metoprolol tartrate 12.5 mg twice daily -Defer additional cardiac diagnostics at this time.  # Small-moderate pericardial effusion Initially discovered on CT scan, formal echo delineated a mild-moderate pericardial effusion without evidence of cardiac tamponade physiology.  Continue surveillance at this time without further cardiac  diagnostics.  Ok for discharge today from a cardiac perspective. Will arrange for follow up in clinic with Dr. Lorelle Gibbs in 1-2 weeks.    This patient's plan of care was discussed and created with Dr. Darrold Junker and he is in agreement.  Signed: Rebeca Allegra , PA-C 08/09/2022, 12:23 PM [facility]

## 2022-08-09 NOTE — Progress Notes (Signed)
Pt leaving the unit alert and oriented x 4. Education and teaching done and all questions answered. Denies pain upon leaving. 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  Care plan and education complete for discharge.

## 2022-08-09 NOTE — Discharge Summary (Signed)
Physician Discharge Summary   Jennifer Hodges  female DOB: Sep 12, 1945  ZOX:[id number]  PCP: Marina Goodell, MD  Admit date: 08/07/2022 Discharge date: 08/09/2022  Admitted From: home Disposition:  home CODE STATUS: Full code  Discharge Instructions     Discharge instructions   Complete by: As directed    Cardiology has started you on Lopressor to control your heart rate.  Since your white blood count is low, Dr. Smith Robert wants you to take oral antibiotic Augmentin for 7 days.  I prescribed both liquid and pill forms in case your pharmacy doesn't have liquid, just pick up 1 of the 2.  You can crush the pills.  Please hold spironolactone and torsemide until followup with outpatient provider since you appeared dehydrated on presentation and had mild acute kidney injury (which has since recovered with IV fluids).   Dr. [name] [facility] Course:  For full details, please see H&P, progress notes, consult notes and ancillary notes.  Briefly,  Jennifer Hodges is a 77 y.o. 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    * SVT (supraventricular tachycardia) Patient is presenting with tachycardia with EKG consistent with SVT's.  She converted to sinus rhythm with rate around 100 after one-time dose of diltiazem.  Patient stated that her heart rate has frequently been increasing up to 160 at home, so suspect this is possibly recurrent.   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   --Bedside echo with Dr. Juliann Pares found  small pericardial effusion, no evidence of tamponade.  Current Echo showed Mild/Mod pericardial effusion.   AKI (acute kidney injury) (HCC) --Cr 1.02 on presentation, up from 0.45 about 1 week ago.  Likely due to aggressive diuresis in light of poor p.o. intake.   - S/p 1 L of fluids.  Cr 0.56 prior to discharge. --hold home spironolactone and torsemide until outpatient followup    Pleural effusion on right History of recurrent right-sided pleural effusion  with most recent thoracentesis on 4/11.  Pleural effusion at this point is small with no access for drainage.  In the setting of AKI, holding diuretics.   Chronic hypoxic respiratory failure on 3L home O2 Patient currently wears 3 L of supplemental oxygen at baseline in the setting of COPD, postradiation fibrosis, and chronic pleural effusions.  Currently experiencing worsening dyspnea on exertion and shortness of breath, however oxygen saturations has maintained on her home levels. - Continue home supplemental oxygen   Hypokalemia --monitored and repleted PRN   Hyponatremia --improved with IVF   Generalized weakness - PT/OT found no need.   Malignant neoplasm of upper lobe of left lung [facility]) Patient has a history of stage IIIa adenocarcinoma of the lung s/p chemoradiation, currently going chemoradiation for recurrence.  Most recent dose of chemotherapy was on 5/20.  Neutropenia WBC normal on presentation, but started to trend down the next day.  ANC 0.5.  Messaged pt's oncologist Dr. Smith Robert who rec Augmentin for 7 days and f/u with outpatient clinic in 1 week.   Unless noted above, medications under "STOP" list are ones  pt was not taking PTA.  Discharge Diagnoses:  Principal Problem:   SVT (supraventricular tachycardia) Active Problems:   Pericardial effusion   AKI (acute kidney injury) (HCC)   Pleural effusion on right   Chronic hypoxic respiratory failure (HCC)   Electrolyte abnormality   Generalized weakness   Malignant  neoplasm of upper lobe of left lung (HCC)   30 Day Unplanned Readmission Risk Score    Flowsheet Row ED to Hosp-Admission (Current) from 08/07/2022 in [facility]  30 Day Unplanned Readmission Risk Score (%) 27.61 Filed at 08/09/2022 0801       This score is the patient's risk of an unplanned readmission within 30 days of being discharged (0 -100%). The score is based on dignosis, age, lab data, medications, orders, and past utilization.   Low:  0-14.9   Medium: 15-21.9   High: 22-29.9   Extreme: 30 and above         Discharge Instructions:  Allergies as of 08/09/2022       Reactions   Aspirin Hives, Shortness Of Breath, Other (See Comments)   Difficulty breathing   Celebrex [celecoxib] Shortness Of Breath   Morphine And Codeine Nausea And Vomiting, Swelling   Adhesive [tape] Other (See Comments)   Took top layer of skin off when removed.   Clarithromycin Nausea And Vomiting   Codeine Nausea And Vomiting   Darvon [propoxyphene Hcl] Nausea And Vomiting   Demerol [meperidine] Nausea And Vomiting   Flonase [fluticasone Propionate] Other (See Comments)   Fungal infection in nose   Simvastatin Other (See Comments)   "caused ulcers in mouth, and fever"   Talwin [pentazocine] Nausea And Vomiting        Medication List     STOP taking these medications    albuterol 108 (90 Base) MCG/ACT inhaler Commonly known as: VENTOLIN HFA   chlorhexidine 0.12 % solution Commonly known as: PERIDEX   Co Q 10 100 MG Caps   DHEA 25 MG Caps   EQL Vitamin D3 25 MCG (1000 UT) capsule Generic drug: Cholecalciferol   potassium chloride SA 20 MEQ tablet Commonly known as: KLOR-CON M       TAKE these medications    acetaminophen 500 MG tablet Commonly known as: TYLENOL Take 500 mg by mouth 2 (two) times daily.   amoxicillin-clavulanate 875-125 MG tablet Commonly known as: AUGMENTIN Take 1 tablet by mouth every 12 (twelve) hours for 7 days.   Augmentin  125-31.25 MG/5ML suspension Generic drug: amoxicillin-clavulanate Take 35 mLs (875 mg total) by mouth 2 (two) times daily for 7 days.   [place] Dream Arthritis 0.025 % Crea Generic drug: Histamine Dihydrochloride Apply 1 application  topically 4 (four) times daily as needed (pain).   CAL-MAG-ZINC PO Take 1 tablet by mouth daily.   cyclobenzaprine 10 MG tablet Commonly known as: FLEXERIL Take 10 mg by mouth at bedtime.   EQ FIBER POWDER PO Take 1 Dose by mouth as needed.   ESTER C PO Take 1 tablet by mouth daily. 500 mg   ezetimibe 10 MG tablet Commonly known as: ZETIA Take 5 mg by mouth in the morning and at bedtime.   HYDROcodone-acetaminophen 5-325 MG tablet Commonly known as: NORCO/VICODIN Take 1 tablet by mouth at bedtime.   loratadine 10 MG tablet Commonly known as: CLARITIN Take 10 mg by mouth daily.   metoprolol tartrate 25 MG tablet Commonly known as: LOPRESSOR Take 0.5 tablets (12.5 mg total) by mouth 2 (two) times daily.  ondansetron 8 MG tablet Commonly known as: Zofran Take 1 tablet (8 mg total) by mouth every 8 (eight) hours as needed for nausea or vomiting.   pantoprazole 40 MG tablet Commonly known as: PROTONIX Take 40 mg by mouth 2 (two) times daily.   polyethylene glycol powder 17 GM/SCOOP powder Commonly known as: GLYCOLAX/MIRALAX Take 17 g by mouth daily as needed for moderate constipation.   Senna 8.6 MG Caps Take 1 capsule by mouth daily as needed (constipation).   spironolactone 50 MG tablet Commonly known as: ALDACTONE Hold until followup with outpatient provider due to acute kidney injury. What changed:  how much to take how to take this when to take this additional instructions   Systane Hydration PF 0.4-0.3 % Soln Generic drug: Polyethyl Glyc-Propyl Glyc PF Place 1 drop into both eyes as needed. as   torsemide 20 MG tablet Commonly known as: DEMADEX Hold until followup with outpatient provider due to acute kidney  injury. What changed:  how much to take how to take this when to take this additional instructions   triamcinolone 55 MCG/ACT Aero nasal inhaler Commonly known as: NASACORT Place 2 sprays into the nose daily as needed.   Ubrelvy 100 MG Tabs Generic drug: Ubrogepant Take 100 mg by mouth daily as needed (migraines).         Follow-up Information     Pendyal, Allene Dillon, MD Follow up in 1 week(s).   Specialty: Cardiology Why: [facility] Contact information: [address] [place] [address] [phone]         Malachy Moan, NP Follow up in 1 week(s).   Specialty: Hospice and Palliative Medicine Contact information: [address] [place] [address] [phone]                 Allergies  Allergen Reactions   Aspirin Hives, Shortness Of Breath and Other (See Comments)    Difficulty breathing   Celebrex [Celecoxib] Shortness Of Breath   Morphine And Codeine Nausea And Vomiting and Swelling   Adhesive [Tape] Other (See Comments)    Took top layer of skin off when removed.   Clarithromycin Nausea And Vomiting   Codeine Nausea And Vomiting   Darvon [Propoxyphene Hcl] Nausea And Vomiting   Demerol [Meperidine] Nausea And Vomiting   Flonase [Fluticasone Propionate] Other (See Comments)    Fungal infection in nose   Simvastatin Other (See Comments)    "caused ulcers in mouth, and fever"   Talwin [Pentazocine] Nausea And Vomiting     The results of significant diagnostics from this hospitalization (including imaging, microbiology, ancillary and laboratory) are listed below for reference.   Consultations:   Procedures/Studies: ECHOCARDIOGRAM COMPLETE  Result Date: 08/08/2022    ECHOCARDIOGRAM LIMITED REPORT   Patient Name:   ANNIA SOLOFF Date of Exam: 08/08/2022 Medical Rec #:  [id number]      Height:       61.0 in Accession #:    [id number]     Weight:       159.0 lb Date of Birth:  Oct 02, 1945       BSA:          1.713 m  Patient Age:    77 years       BP:           101/50 mmHg Patient Gender: F              HR:  83 bpm. Exam Location:  [facility] Procedure: 2D Echo, Color Doppler and Cardiac Doppler Indications:     I31.3 Pericardial Effusion  History:         Patient has prior history of Echocardiogram examinations.                  Murmur; COPD and Lug CA.  Sonographer:     L. Thornton-Maynard Referring Phys:  [id number] Verdene Lennert Diagnosing Phys: Alwyn Pea MD  Sonographer Comments: Image acquisition challenging due to COPD. IMPRESSIONS  1. Mild/Mod pericardial effusion . No evidence of tamponade.  2. Left ventricular ejection fraction, by estimation, is 70 to 75%. Left ventricular ejection fraction by PLAX is 73 %. The left ventricle has hyperdynamic function. The left ventricle has no regional wall motion abnormalities. Left ventricular diastolic parameters are consistent with Grade I diastolic dysfunction (impaired relaxation).  3. Right ventricular systolic function is normal. The right ventricular size is normal.  4. Moderate pericardial effusion.  5. The mitral valve is normal in structure. No evidence of mitral valve regurgitation. FINDINGS  Left Ventricle: Left ventricular ejection fraction, by estimation, is 70 to 75%. Left ventricular ejection fraction by PLAX is 73 %. The left ventricle has hyperdynamic function. The left ventricle has no regional wall motion abnormalities. The left ventricular internal cavity size was normal in size. There is no left ventricular hypertrophy. Left ventricular diastolic parameters are consistent with Grade I diastolic dysfunction (impaired relaxation). Right Ventricle: The right ventricular size is normal. No increase in right ventricular wall thickness. Right ventricular systolic function is normal. Left Atrium: Left atrial size was normal in size. Right Atrium: Right atrial size was normal in size. Pericardium: A moderately sized pericardial effusion is present. Mitral  Valve: The mitral valve is normal in structure. Tricuspid Valve: The tricuspid valve is normal in structure. Tricuspid valve regurgitation is trivial. Aortic Valve: Aortic valve mean gradient measures 2.0 mmHg. Aortic valve peak gradient measures 3.8 mmHg. Aortic valve area, by VTI measures 2.98 cm. Pulmonic Valve: The pulmonic valve was normal in structure. Pulmonic valve regurgitation is not visualized. Aorta: The ascending aorta was not well visualized. Additional Comments: Mild/Mod pericardial effusion . No evidence of tamponade.  LEFT VENTRICLE PLAX 2D LV EF:         Left            Diastology                ventricular     LV e' medial:    5.55 cm/s                ejection        LV E/e' medial:  14.8                fraction by     LV e' lateral:   7.62 cm/s                PLAX is 73      LV E/e' lateral: 10.8                %. LVIDd:         3.90 cm LVIDs:         2.30 cm LV PW:         1.00 cm LV IVS:        1.10 cm LVOT diam:     2.00 cm LV SV:         45 LV SV  Index:   26 LVOT Area:     3.14 cm  LV Volumes (MOD) LV vol d, MOD    28.7 ml A2C: LV vol d, MOD    34.7 ml A4C: LV vol s, MOD    14.0 ml A2C: LV vol s, MOD    14.2 ml A4C: LV SV MOD A2C:   14.7 ml LV SV MOD A4C:   34.7 ml LV SV MOD BP:    17.5 ml RIGHT VENTRICLE RV S prime:     8.27 cm/s TAPSE (M-mode): 2.6 cm LEFT ATRIUM           Index        RIGHT ATRIUM           Index LA diam:      3.50 cm 2.04 cm/m   RA Area:     10.70 cm LA Vol (A4C): 24.9 ml 14.53 ml/m  RA Volume:   20.00 ml  11.67 ml/m  AORTIC VALVE                    PULMONIC VALVE AV Area (Vmax):    2.86 cm     PV Vmax:          0.57 m/s AV Area (Vmean):   2.72 cm     PV Peak grad:     1.3 mmHg AV Area (VTI):     2.98 cm     PR End Diast Vel: 2.26 msec AV Vmax:           97.20 cm/s AV Vmean:          66.000 cm/s AV VTI:            0.152 m AV Peak Grad:      3.8 mmHg AV Mean Grad:      2.0 mmHg LVOT Vmax:         88.60 cm/s LVOT Vmean:        57.100 cm/s LVOT VTI:          0.144 m  LVOT/AV VTI ratio: 0.95  AORTA Ao Root diam: 3.20 cm Ao Asc diam:  3.60 cm MITRAL VALVE MV Area (PHT): 4.60 cm    SHUNTS MV Decel Time: 165 msec    Systemic VTI:  0.14 m MV E velocity: 82.30 cm/s  Systemic Diam: 2.00 cm MV A velocity: 94.30 cm/s MV E/A ratio:  0.87 Dwayne D Callwood MD Electronically signed by Alwyn Pea MD Signature Date/Time: 08/08/2022/11:46:15 AM    Final    US Venous Img Lower Unilateral Left (DVT)  Result Date: 08/07/2022 CLINICAL DATA:  Left lower extremity edema. EXAM: LEFT LOWER EXTREMITY VENOUS DOPPLER ULTRASOUND TECHNIQUE: Gray-scale sonography with compression, as well as color and duplex ultrasound, were performed to evaluate the deep venous system(s) from the level of the common femoral vein through the popliteal and proximal calf veins. COMPARISON:  None Available. FINDINGS: VENOUS Normal compressibility of the common femoral, superficial femoral, and popliteal veins, as well as the visualized calf veins. Visualized portions of profunda femoral vein and great saphenous vein unremarkable. No filling defects to suggest DVT on grayscale or color Doppler imaging. Doppler waveforms show normal direction of venous flow, normal respiratory plasticity and response to augmentation. Limited views of the contralateral common femoral vein are unremarkable. OTHER Fluid collection in the left popliteal fossa measures 2.5 by 0.9 x 2.6 cm. Limitations: None IMPRESSION: 1. No evidence for left lower extremity DVT. 2. Small left Baker's cyst.  Electronically Signed   By: Signa Kell M.D.   On: 08/07/2022 18:01   CT Angio Chest PE W and/or Wo Contrast  Result Date: 08/07/2022 CLINICAL DATA:  PE suspected lung cancer * Tracking Code: BO * EXAM: CT ANGIOGRAPHY CHEST WITH CONTRAST TECHNIQUE: Multidetector CT imaging of the chest was performed using the standard protocol during bolus administration of intravenous contrast. Multiplanar CT image reconstructions and MIPs were obtained to  evaluate the vascular anatomy. RADIATION DOSE REDUCTION: This exam was performed according to the departmental dose-optimization program which includes automated exposure control, adjustment of the mA and/or kV according to patient size and/or use of iterative reconstruction technique. CONTRAST:  75mL OMNIPAQUE IOHEXOL 350 MG/ML SOLN COMPARISON:  PET-CT, 05/30/2022 FINDINGS: Cardiovascular: Satisfactory opacification of the pulmonary arteries to the segmental level. No evidence of pulmonary embolism. Cardiomegaly. Left and right coronary artery calcifications. Moderate pericardial effusion, increased compared to prior examination. Right chest port catheter. Aortic atherosclerosis Mediastinum/Nodes: Unchanged matted soft tissue throughout the mediastinum and hila without discretely enlarged lymph nodes. Thyroid gland, trachea, and esophagus demonstrate no significant findings. Lungs/Pleura: Small right pleural effusion and associated atelectasis or consolidation, improved compared to prior examination. Similar post treatment appearance of the chest, with dense paramedian perihilar consolidation fibrosis of the lungs, with some interval evolution of radiation fibrosis. Moderate underlying emphysema. Upper Abdomen: No acute abnormality. Musculoskeletal: No chest wall abnormality. No acute osseous findings. Review of the MIP images confirms the above findings. IMPRESSION: 1. Negative examination for pulmonary embolism. 2. Similar post treatment appearance of the chest, with dense paramedian perihilar consolidation fibrosis of the lungs, with some interval evolution of radiation fibrosis. 3. Small right pleural effusion and associated atelectasis or consolidation, improved compared to prior examination. 4. Unchanged matted soft tissue throughout the mediastinum and hila without discretely enlarged lymph nodes. 5. Cardiomegaly and coronary artery disease. 6. Moderate pericardial effusion, increased compared to prior  examination. Aortic Atherosclerosis (ICD10-I70.0). Electronically Signed   By: Jearld Lesch M.D.   On: 08/07/2022 14:58   DG Chest Port 1 View  Result Date: 08/07/2022 CLINICAL DATA:  [id number] SOB (shortness of breath) [id number] EXAM: PORTABLE CHEST - 1 VIEW COMPARISON:  07/18/2022 FINDINGS: Persistent moderate right pleural effusion with adjacent opacities at the right lung base. Coarse non anatomic opacities at the right hilum slightly more conspicuous. Right IJ port catheter stable, tip distal SVC. Coarse left perihilar interstitial opacities. Heart size and mediastinal contours are within normal limits. Aortic Atherosclerosis (ICD10-170.0). No pneumothorax. Cervical fixation hardware partially visualized. IMPRESSION: Persistent moderate right pleural effusion with adjacent right lower lung atelectasis/infiltrate. Electronically Signed   By: Corlis Leak M.D.   On: 08/07/2022 12:25   DG Chest 2 View  Result Date: 07/18/2022 CLINICAL DATA:  shortness of breath; pleural effusion EXAM: CHEST - 2 VIEW COMPARISON:  06/23/2022 FINDINGS: Chest port catheter tip overlies the distal SVC. Unchanged cardiomediastinal silhouette. There is a persistent small right pleural effusion with adjacent masslike architectural distortion, fibrosis, and volume loss, similar to prior exam. Trace left pleural effusion, similar to prior, with adjacent left basilar atelectasis/scarring. No new airspace disease. No evidence of pneumothorax. No acute osseous abnormality. Partially visualized cervical spine fusion hardware. IMPRESSION: Unchanged small right pleural effusion and adjacent posttreatment pulmonary parenchymal changes. Unchanged trace left pleural effusion. No new airspace disease. Electronically Signed   By: Caprice Renshaw M.D.   On: 07/18/2022 11:04      Labs: BNP (last 3 results) Recent Labs    02/04/22 1028 04/05/22 1621  08/07/22 1201  BNP 130.6* 115.4* 354.9*   Basic Metabolic Panel: Recent Labs  Lab  08/07/22 1201 08/08/22 0536 08/09/22 0511  NA 130* 133* 135  K 3.4* 3.6 3.3*  CL 88* 94* 94*  CO2 27 32 31  GLUCOSE 125* 96 93  BUN 24* 19 15  CREATININE 1.02* 0.76 0.56  CALCIUM 8.8* 8.8* 8.7*  MG  --   --  2.1   Liver Function Tests: Recent Labs  Lab 08/07/22 1201  AST 27  ALT 14  ALKPHOS 90  BILITOT 0.8  PROT 7.3  ALBUMIN 3.9   No results for input(s): "LIPASE", "AMYLASE" in the last 168 hours. No results for input(s): "AMMONIA" in the last 168 hours. CBC: Recent Labs  Lab 08/07/22 1201 08/08/22 0536 08/09/22 0511  WBC 4.0 1.6* 1.4*  1.4*  NEUTROABS 3.0  --  0.5*  HGB 11.4* 8.9* 8.4*  8.5*  HCT 34.3* 27.5* 25.8*  26.0*  MCV 90.0 92.6 91.2  90.9  PLT 376 252 218  207   Cardiac Enzymes: No results for input(s): "CKTOTAL", "CKMB", "CKMBINDEX", "TROPONINI" in the last 168 hours. BNP: Invalid input(s): "POCBNP" CBG: Recent Labs  Lab 08/08/22 1652  GLUCAP 190*   D-Dimer No results for input(s): "DDIMER" in the last 72 hours. Hgb A1c No results for input(s): "HGBA1C" in the last 72 hours. Lipid Profile No results for input(s): "CHOL", "HDL", "LDLCALC", "TRIG", "CHOLHDL", "LDLDIRECT" in the last 72 hours. Thyroid function studies Recent Labs    08/07/22 1201  TSH 3.044   Anemia work up No results for input(s): "VITAMINB12", "FOLATE", "FERRITIN", "TIBC", "IRON", "RETICCTPCT" in the last 72 hours. Urinalysis    Component Value Date/Time   COLORURINE YELLOW (A) 04/06/2022 0428   APPEARANCEUR CLEAR (A) 04/06/2022 0428   APPEARANCEUR Clear 09/20/2021 1449   LABSPEC 1.030 04/06/2022 0428   PHURINE 5.0 04/06/2022 0428   GLUCOSEU NEGATIVE 04/06/2022 0428   GLUCOSEU NEGATIVE 06/19/2014 1002   HGBUR NEGATIVE 04/06/2022 0428   BILIRUBINUR NEGATIVE 04/06/2022 0428   BILIRUBINUR Negative 09/20/2021 1449   KETONESUR 5 (A) 04/06/2022 0428   PROTEINUR NEGATIVE 04/06/2022 0428   UROBILINOGEN 1.0 03/04/2015 1339   UROBILINOGEN 0.2 06/19/2014 1002    NITRITE NEGATIVE 04/06/2022 0428   LEUKOCYTESUR TRACE (A) 04/06/2022 0428   Sepsis Labs Recent Labs  Lab 08/07/22 1201 08/08/22 0536 08/09/22 0511  WBC 4.0 1.6* 1.4*  1.4*   Microbiology Recent Results (from the past 240 hour(s))  Blood culture (routine x 2)     Status: None (Preliminary result)   Collection Time: 08/07/22 12:01 PM   Specimen: BLOOD  Result Value Ref Range Status   Specimen Description BLOOD BLOOD LEFT ARM  Final   Special Requests   Final    BOTTLES DRAWN AEROBIC AND ANAEROBIC Blood Culture adequate volume   Culture   Final    NO GROWTH 2 DAYS Performed at [facility], [address], [place], [place] [address]    Report Status PENDING  Incomplete  Blood culture (routine x 2)     Status: None (Preliminary result)   Collection Time: 08/07/22 12:02 PM   Specimen: BLOOD  Result Value Ref Range Status   Specimen Description BLOOD BLOOD RIGHT ARM  Final   Special Requests   Final    BOTTLES DRAWN AEROBIC AND ANAEROBIC Blood Culture adequate volume   Culture   Final    NO GROWTH 2 DAYS Performed at [facility], [address], [place], [place] [address]  Report Status PENDING  Incomplete     Total time spend on discharging this patient, including the last patient exam, discussing the hospital stay, instructions for ongoing care as it relates to all pertinent caregivers, as well as preparing the medical discharge records, prescriptions, and/or referrals as applicable, is 40 minutes.    [name], MD  [facility] 08/09/2022, 12:10 PM

## 2022-08-10 ENCOUNTER — Other Ambulatory Visit: Payer: Self-pay | Admitting: *Deleted

## 2022-08-10 DIAGNOSIS — I471 Supraventricular tachycardia, unspecified: Secondary | ICD-10-CM

## 2022-08-10 DIAGNOSIS — J90 Pleural effusion, not elsewhere classified: Secondary | ICD-10-CM

## 2022-08-10 DIAGNOSIS — C3412 Malignant neoplasm of upper lobe, left bronchus or lung: Secondary | ICD-10-CM

## 2022-08-11 LAB — CULTURE, BLOOD (ROUTINE X 2): Culture: NO GROWTH

## 2022-08-12 LAB — CULTURE, BLOOD (ROUTINE X 2): Special Requests: ADEQUATE

## 2022-08-16 ENCOUNTER — Other Ambulatory Visit: Payer: Self-pay

## 2022-08-16 ENCOUNTER — Inpatient Hospital Stay: Payer: Medicare Other | Attending: Oncology

## 2022-08-16 ENCOUNTER — Inpatient Hospital Stay (HOSPITAL_BASED_OUTPATIENT_CLINIC_OR_DEPARTMENT_OTHER): Payer: Medicare Other | Admitting: Hospice and Palliative Medicine

## 2022-08-16 VITALS — BP 118/71 | HR 95 | Temp 97.9°F | Resp 22

## 2022-08-16 DIAGNOSIS — Z5111 Encounter for antineoplastic chemotherapy: Secondary | ICD-10-CM | POA: Diagnosis present

## 2022-08-16 DIAGNOSIS — I3139 Other pericardial effusion (noninflammatory): Secondary | ICD-10-CM | POA: Insufficient documentation

## 2022-08-16 DIAGNOSIS — C3412 Malignant neoplasm of upper lobe, left bronchus or lung: Secondary | ICD-10-CM | POA: Diagnosis present

## 2022-08-16 DIAGNOSIS — I471 Supraventricular tachycardia, unspecified: Secondary | ICD-10-CM

## 2022-08-16 DIAGNOSIS — D6481 Anemia due to antineoplastic chemotherapy: Secondary | ICD-10-CM | POA: Diagnosis not present

## 2022-08-16 DIAGNOSIS — J90 Pleural effusion, not elsewhere classified: Secondary | ICD-10-CM

## 2022-08-16 DIAGNOSIS — R0602 Shortness of breath: Secondary | ICD-10-CM

## 2022-08-16 LAB — BASIC METABOLIC PANEL
Anion gap: 10 (ref 5–15)
BUN: 18 mg/dL (ref 8–23)
CO2: 30 mmol/L (ref 22–32)
Calcium: 8.9 mg/dL (ref 8.9–10.3)
Chloride: 96 mmol/L — ABNORMAL LOW (ref 98–111)
Creatinine, Ser: 0.63 mg/dL (ref 0.44–1.00)
GFR, Estimated: >60 mL/min (ref >60)
Glucose, Bld: 102 mg/dL — ABNORMAL HIGH (ref 70–99)
Potassium: 3.8 mmol/L (ref 3.5–5.1)
Sodium: 136 mmol/L (ref 135–145)

## 2022-08-16 LAB — CBC WITH DIFFERENTIAL/PLATELET
Abs Immature Granulocytes: 0.05 10*3/uL (ref 0.00–0.07)
Basophils Absolute: 0 10*3/uL (ref 0.0–0.1)
Basophils Relative: 0 %
Eosinophils Absolute: 0.1 10*3/uL (ref 0.0–0.5)
Eosinophils Relative: 2 %
HCT: 28.3 % — ABNORMAL LOW (ref 36.0–46.0)
Hemoglobin: 9.1 g/dL — ABNORMAL LOW (ref 12.0–15.0)
Immature Granulocytes: 1 %
Lymphocytes Relative: 14 %
Lymphs Abs: 0.8 10*3/uL (ref 0.7–4.0)
MCH: 30.3 pg (ref 26.0–34.0)
MCHC: 32.2 g/dL (ref 30.0–36.0)
MCV: 94.3 fL (ref 80.0–100.0)
Monocytes Absolute: 0.7 10*3/uL (ref 0.1–1.0)
Monocytes Relative: 13 %
Neutro Abs: 3.9 10*3/uL (ref 1.7–7.7)
Neutrophils Relative %: 70 %
Platelets: 254 10*3/uL (ref 150–400)
RBC: 3 MIL/uL — ABNORMAL LOW (ref 3.87–5.11)
RDW: 16.8 % — ABNORMAL HIGH (ref 11.5–15.5)
WBC: 5.4 10*3/uL (ref 4.0–10.5)
nRBC: 0 % (ref 0.0–0.2)

## 2022-08-16 MED ORDER — IPRATROPIUM-ALBUTEROL 0.5-2.5 (3) MG/3ML IN SOLN: 3.0000 mL | Freq: Four times a day (QID) | RESPIRATORY_TRACT | 0 refills | Status: DC | PRN

## 2022-08-16 NOTE — Progress Notes (Signed)
[facility] at [facility] [phone] [phone]  Patient Care Team: Marina Goodell, MD as PCP - General (Family Medicine) Glory Buff, RN as Registered Nurse Carmina Miller, MD as Referring Physician (Radiation Oncology) Creig Hines, MD as Consulting Physician (Oncology)   NAME OF PATIENT: Jennifer Hodges  [id number]  04/10/1945   DATE OF VISIT: 08/16/22  REASON FOR CONSULT: Jennifer Hodges is a 77 y.o. 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  Patient on maintenance Alimta until November 2023 when treatment was held due to worsening anemia.  She was restarted in April 2024 due to progressive disease.  INTERVAL HISTORY: Patient saw Dr. Smith Robert on 08/01/2018 for cycle 3 Alimta.  Plan is to repeat scans after 4 cycles.  Patient was hospitalized 08/07/2022 to 08/09/2022 with generalized weakness and was found to have SVT which converted to normal sinus with diltiazem.  Patient found to have mild pericardial effusion on echocardiogram.  Patient presents [facility] today for post hospital follow-up.  She says that she is feeling much improved since her hospitalization.  She says that she has continued to have intermittent sternal pain but denies angina or pressure.  She says that she was having this during her hospitalization and cardiology was aware.  Patient says that she is also had intermittent wheezing but says that she is out of her nebulizers and request refill of same.  She says that she has pending follow-up with cardiology and pulmonary later this month.  Denies any neurologic complaints. Denies recent fevers or illnesses. Denies any easy bleeding or bruising. Reports fair appetite and denies weight loss. Denies chest pain.  Denies urinary complaints. Patient offers no further  specific complaints today.   PAST MEDICAL HISTORY: Past Medical History:  Diagnosis Date   Anemia    Asthma    No Inhalers--Dr. Meredeth Ide will order as needed   Bronchiectasis (HCC)    mild   Chronic headaches     followed by [facility] migraines   COPD (chronic obstructive pulmonary disease) (HCC)    DDD (degenerative disc disease), lumbar    Diverticulosis    Family history of adverse reaction to anesthesia    mother and sisters-PONV   Gall stones    history of   GERD (gastroesophageal reflux disease)    EGD 8/09- non bleeding erosive gastritis, documentd esophageal ulcerations.    Hiatal hernia    small   History of kidney stones    History of pneumonia    Hypercholesterolemia    IBS (irritable bowel syndrome)    Malignant neoplasm of upper lobe of left lung (HCC) 12/27/2018   Meniere disease    Murmur    Osteoarthritis    lumbar disc disease, left hip   Personal history of chemotherapy    Personal history of radiation therapy    Pneumonia 12/2020   PONV (postoperative nausea and vomiting)    Sleep apnea    uses cpap   Vertigo    Weakness of right side of body     PAST SURGICAL HISTORY:  Past Surgical History:  Procedure Laterality Date   ABDOMINAL HYSTERECTOMY  age 59   ANTERIOR CERVICAL DECOMP/DISCECTOMY FUSION N/A 02/24/2015   Procedure: CERVICAL FOUR-FIVE, CERVICAL FIVE-SIX, CERVICAL SIX-SEVEN ANTERIOR CERVICAL DECOMPRESSION/DISCECTOMY FUSION ;  Surgeon: Lisbeth Renshaw, MD;  Location: [facility];  Service:  Neurosurgery;  Laterality: N/A;  C45 C56 C67 anterior cervical decompression with fusion interbody prosthesis plating and bonegraft   APPENDECTOMY     BACK SURGERY  1988   4th lumbar fusion   BLADDER SURGERY N/A    with vaginal wall repair   BREAST CYST ASPIRATION Bilateral    neg   BREAST SURGERY Bilateral    cyst removed and reduction   CARDIAC CATHETERIZATION  2014   CARPAL TUNNEL RELEASE Right 02/11/2016   Procedure: CARPAL TUNNEL  RELEASE;  Surgeon: Kennedy Bucker, MD;  Location: [facility] ORS;  Service: Orthopedics;  Laterality: Right;   CATARACT EXTRACTION W/ INTRAOCULAR LENS IMPLANT Bilateral 2015   CHOLECYSTECTOMY     EUS N/A 05/31/2012   Procedure: UPPER ENDOSCOPIC ULTRASOUND (EUS) LINEAR;  Surgeon: Rachael Fee, MD;  Location: [facility];  Service: Endoscopy;  Laterality: N/A;   EXCISIONAL HEMORRHOIDECTOMY     JOINT REPLACEMENT Bilateral    KNEE ARTHROSCOPY WITH LATERAL MENISECTOMY Right 07/07/2015   Procedure: KNEE ARTHROSCOPY WITH LATERAL MENISECTOMY, PARTIAL SYNOVECTOMY;  Surgeon: Kennedy Bucker, MD;  Location: [facility] ORS;  Service: Orthopedics;  Laterality: Right;   LUMBAR LAMINECTOMY     PORTA CATH INSERTION N/A 01/07/2019   Procedure: PORTA CATH INSERTION;  Surgeon: Annice Needy, MD;  Location: [facility] INVASIVE CV LAB;  Service: Cardiovascular;  Laterality: N/A;   REDUCTION MAMMAPLASTY  1990   RIGHT OOPHORECTOMY     TOTAL HIP ARTHROPLASTY Left 05/01/2014   Dr. Sheppard Evens   TOTAL HIP ARTHROPLASTY Right 08/05/2014   Procedure: TOTAL HIP ARTHROPLASTY ANTERIOR APPROACH;  Surgeon: Kennedy Bucker, MD;  Location: [facility] ORS;  Service: Orthopedics;  Laterality: Right;   ULNAR NERVE TRANSPOSITION Right 02/11/2016   Procedure: ULNAR NERVE DECOMPRESSION/TRANSPOSITION;  Surgeon: Kennedy Bucker, MD;  Location: [facility] ORS;  Service: Orthopedics;  Laterality: Right;   VIDEO BRONCHOSCOPY WITH ENDOBRONCHIAL ULTRASOUND Left 12/19/2018   Procedure: VIDEO BRONCHOSCOPY WITH ENDOBRONCHIAL ULTRASOUND, LEFT, SLEEP APNEA;  Surgeon: Vida Rigger, MD;  Location: [facility] ORS;  Service: Thoracic;  Laterality: Left;   VIDEO BRONCHOSCOPY WITH ENDOBRONCHIAL ULTRASOUND Right 05/14/2021   Procedure: VIDEO BRONCHOSCOPY WITH ENDOBRONCHIAL ULTRASOUND;  Surgeon: Salena Saner, MD;  Location: [facility] ORS;  Service: Pulmonary;  Laterality: Right;    HEMATOLOGY/ONCOLOGY HISTORY:  Oncology History Overview Note  #October 2020-stage III lung  cancer-chemoradiation; currently on maintenance durvalumab.   Malignant neoplasm of upper lobe of left lung (HCC)  12/27/2018 Initial Diagnosis   Malignant neoplasm of upper lobe of left lung (HCC)   12/27/2018 Cancer Staging   Staging form: Lung, AJCC 8th Edition - Clinical stage from 12/27/2018: Stage IIIA (cT1b, cN2, cM0) - Signed by Creig Hines, MD on 12/27/2018   01/14/2019 - 03/12/2019 Chemotherapy   The patient had dexamethasone (DECADRON) 4 MG tablet, 8 mg, Oral, Daily, 1 of 1 cycle, Start date: 12/28/2018, End date: 03/22/2019 palonosetron (ALOXI) injection 0.25 mg, 0.25 mg, Intravenous,  Once, 7 of 7 cycles Administration: 0.25 mg (01/14/2019), 0.25 mg (01/21/2019), 0.25 mg (01/28/2019), 0.25 mg (02/04/2019), 0.25 mg (02/12/2019), 0.25 mg (02/19/2019), 0.25 mg (03/12/2019) CARBOplatin (PARAPLATIN) 170 mg in sodium chloride 0.9 % 100 mL chemo infusion, 170 mg (100 % of original dose 171.2 mg), Intravenous,  Once, 7 of 7 cycles Dose modification:   (original dose 171.2 mg, Cycle 1) Administration: 170 mg (01/14/2019), 170 mg (01/21/2019), 170 mg (01/28/2019), 170 mg (02/04/2019), 170 mg (02/12/2019), 170 mg (02/19/2019), 130 mg (03/12/2019) PACLitaxel (TAXOL) 84 mg in sodium chloride 0.9 % 250 mL chemo infusion (</=  80mg /m2), 45 mg/m2 = 84 mg, Intravenous,  Once, 7 of 7 cycles Administration: 84 mg (01/14/2019), 84 mg (01/21/2019), 84 mg (01/28/2019), 84 mg (02/04/2019), 84 mg (02/12/2019), 84 mg (02/19/2019), 84 mg (03/12/2019)  for chemotherapy treatment.    04/04/2019 - 04/30/2020 Chemotherapy   Patient is on Treatment Plan : LUNG DURVALUMAB Q14D     05/26/2021 - 10/27/2021 Chemotherapy   Patient is on Treatment Plan : LUNG NSCLC Pemetrexed + Carboplatin q21d x 4 Cycles     11/14/2021 -  Chemotherapy   Patient is on Treatment Plan : LUNG NSCLC Pemetrexed + Carboplatin q21d x 3 Cycles       ALLERGIES:  is allergic to aspirin, celebrex [celecoxib], morphine and codeine, adhesive [tape],  clarithromycin, codeine, darvon [propoxyphene hcl], demerol [meperidine], flonase [fluticasone propionate], simvastatin, and talwin [pentazocine].  MEDICATIONS:  Current Outpatient Medications  Medication Sig Dispense Refill   acetaminophen (TYLENOL) 500 MG tablet Take 500 mg by mouth 2 (two) times daily.     amoxicillin-clavulanate (AUGMENTIN) 875-125 MG tablet Take 1 tablet by mouth every 12 (twelve) hours for 7 days. 14 tablet 0   Bioflavonoid Products (ESTER C PO) Take 1 tablet by mouth daily. 500 mg     Calcium-Magnesium-Zinc (CAL-MAG-ZINC PO) Take 1 tablet by mouth daily.     cyclobenzaprine (FLEXERIL) 10 MG tablet Take 10 mg by mouth at bedtime.     ezetimibe (ZETIA) 10 MG tablet Take 5 mg by mouth in the morning and at bedtime.     glycerin adult 2 g suppository Place 1 suppository rectally as needed for constipation.     Histamine Dihydrochloride (AUSTRALIAN DREAM ARTHRITIS) 0.025 % CREA Apply 1 application  topically 4 (four) times daily as needed (pain).     HYDROcodone-acetaminophen (NORCO/VICODIN) 5-325 MG tablet Take 1 tablet by mouth at bedtime. 30 tablet 0   loratadine (CLARITIN) 10 MG tablet Take 10 mg by mouth daily.     metoprolol tartrate (LOPRESSOR) 25 MG tablet Take 0.5 tablets (12.5 mg total) by mouth 2 (two) times daily. 30 tablet 2   ondansetron (ZOFRAN) 8 MG tablet Take 1 tablet (8 mg total) by mouth every 8 (eight) hours as needed for nausea or vomiting. 20 tablet 0   pantoprazole (PROTONIX) 40 MG tablet Take 40 mg by mouth 2 (two) times daily.     Polyethyl Glyc-Propyl Glyc PF (SYSTANE HYDRATION PF) 0.4-0.3 % SOLN Place 1 drop into both eyes as needed. as     polyethylene glycol powder (GLYCOLAX/MIRALAX) 17 GM/SCOOP powder Take 17 g by mouth daily as needed for moderate constipation.     Sennosides (SENNA) 8.6 MG CAPS Take 1 capsule by mouth daily as needed (constipation).     spironolactone (ALDACTONE) 50 MG tablet Hold until followup with outpatient provider due to  acute kidney injury.     torsemide (DEMADEX) 20 MG tablet Hold until followup with outpatient provider due to acute kidney injury.     triamcinolone (NASACORT) 55 MCG/ACT AERO nasal inhaler Place 2 sprays into the nose daily as needed.     UBRELVY 100 MG TABS Take 100 mg by mouth daily as needed (migraines).     Wheat Dextrin (EQ FIBER POWDER PO) Take 1 Dose by mouth as needed.     No current facility-administered medications for this visit.    VITAL SIGNS: BP 118/71   Pulse 95   Temp 97.9 F (36.6 C) (Tympanic)   Resp (!) 22   SpO2 96%  There were no vitals  filed for this visit.   Estimated body mass index is 29.08 kg/m as calculated from the following:   Height as of 08/08/22: 5\' 1"  (1.549 m).   Weight as of 08/08/22: 153 lb 14.4 oz (69.8 kg).  LABS: CBC:    Component Value Date/Time   WBC 5.4 08/16/2022 1049   HGB 9.1 (L) 08/16/2022 1049   HGB 9.6 (L) 07/25/2022 0901   HGB 12.2 09/22/2011 0018   HCT 28.3 (L) 08/16/2022 1049   HCT 37.0 09/22/2011 0018   PLT 254 08/16/2022 1049   PLT 228 07/25/2022 0901   PLT 241 09/22/2011 0018   MCV 94.3 08/16/2022 1049   MCV 93 09/22/2011 0018   NEUTROABS 3.9 08/16/2022 1049   LYMPHSABS 0.8 08/16/2022 1049   MONOABS 0.7 08/16/2022 1049   EOSABS 0.1 08/16/2022 1049   BASOSABS 0.0 08/16/2022 1049   Comprehensive Metabolic Panel:    Component Value Date/Time   NA 136 08/16/2022 1049   NA 144 09/22/2011 0018   K 3.8 08/16/2022 1049   K 4.7 09/22/2011 0018   CL 96 (L) 08/16/2022 1049   CL 106 09/22/2011 0018   CO2 30 08/16/2022 1049   CO2 29 09/22/2011 0018   BUN 18 08/16/2022 1049   BUN 20 (H) 09/22/2011 0018   CREATININE 0.63 08/16/2022 1049   CREATININE 0.62 07/25/2022 0901   CREATININE 0.62 09/22/2011 0018   GLUCOSE 102 (H) 08/16/2022 1049   GLUCOSE 105 (H) 09/22/2011 0018   CALCIUM 8.9 08/16/2022 1049   CALCIUM 9.2 09/22/2011 0018   AST 27 08/07/2022 1201   AST 26 07/25/2022 0901   ALT 14 08/07/2022 1201   ALT 13  07/25/2022 0901   ALT 20 09/22/2011 0018   ALKPHOS 90 08/07/2022 1201   ALKPHOS 103 09/22/2011 0018   BILITOT 0.8 08/07/2022 1201   BILITOT 0.4 07/25/2022 0901   PROT 7.3 08/07/2022 1201   PROT 6.4 09/22/2011 0018   ALBUMIN 3.9 08/07/2022 1201   ALBUMIN 3.8 09/22/2011 0018    RADIOGRAPHIC STUDIES: ECHOCARDIOGRAM COMPLETE  Result Date: 08/08/2022    ECHOCARDIOGRAM LIMITED REPORT   Patient Name:   ORMA ULLMAN Date of Exam: 08/08/2022 Medical Rec #:  [id number]      Height:       61.0 in Accession #:    [id number]     Weight:       159.0 lb Date of Birth:  May 11, 1945       BSA:          1.713 m Patient Age:    77 years       BP:           101/50 mmHg Patient Gender: F              HR:           83 bpm. Exam Location:  [facility] Procedure: 2D Echo, Color Doppler and Cardiac Doppler Indications:     I31.3 Pericardial Effusion  History:         Patient has prior history of Echocardiogram examinations.                  Murmur; COPD and Lug CA.  Sonographer:     L. Thornton-Maynard Referring Phys:  [id number] Verdene Lennert Diagnosing Phys: Alwyn Pea MD  Sonographer Comments: Image acquisition challenging due to COPD. IMPRESSIONS  1. Mild/Mod pericardial effusion . No evidence of tamponade.  2. Left ventricular ejection fraction, by estimation, is 70 to  75%. Left ventricular ejection fraction by PLAX is 73 %. The left ventricle has hyperdynamic function. The left ventricle has no regional wall motion abnormalities. Left ventricular diastolic parameters are consistent with Grade I diastolic dysfunction (impaired relaxation).  3. Right ventricular systolic function is normal. The right ventricular size is normal.  4. Moderate pericardial effusion.  5. The mitral valve is normal in structure. No evidence of mitral valve regurgitation. FINDINGS  Left Ventricle: Left ventricular ejection fraction, by estimation, is 70 to 75%. Left ventricular ejection fraction by PLAX is 73 %. The left ventricle has hyperdynamic  function. The left ventricle has no regional wall motion abnormalities. The left ventricular internal cavity size was normal in size. There is no left ventricular hypertrophy. Left ventricular diastolic parameters are consistent with Grade I diastolic dysfunction (impaired relaxation). Right Ventricle: The right ventricular size is normal. No increase in right ventricular wall thickness. Right ventricular systolic function is normal. Left Atrium: Left atrial size was normal in size. Right Atrium: Right atrial size was normal in size. Pericardium: A moderately sized pericardial effusion is present. Mitral Valve: The mitral valve is normal in structure. Tricuspid Valve: The tricuspid valve is normal in structure. Tricuspid valve regurgitation is trivial. Aortic Valve: Aortic valve mean gradient measures 2.0 mmHg. Aortic valve peak gradient measures 3.8 mmHg. Aortic valve area, by VTI measures 2.98 cm. Pulmonic Valve: The pulmonic valve was normal in structure. Pulmonic valve regurgitation is not visualized. Aorta: The ascending aorta was not well visualized. Additional Comments: Mild/Mod pericardial effusion . No evidence of tamponade.  LEFT VENTRICLE PLAX 2D LV EF:         Left            Diastology                ventricular     LV e' medial:    5.55 cm/s                ejection        LV E/e' medial:  14.8                fraction by     LV e' lateral:   7.62 cm/s                PLAX is 73      LV E/e' lateral: 10.8                %. LVIDd:         3.90 cm LVIDs:         2.30 cm LV PW:         1.00 cm LV IVS:        1.10 cm LVOT diam:     2.00 cm LV SV:         45 LV SV Index:   26 LVOT Area:     3.14 cm  LV Volumes (MOD) LV vol d, MOD    28.7 ml A2C: LV vol d, MOD    34.7 ml A4C: LV vol s, MOD    14.0 ml A2C: LV vol s, MOD    14.2 ml A4C: LV SV MOD A2C:   14.7 ml LV SV MOD A4C:   34.7 ml LV SV MOD BP:    17.5 ml RIGHT VENTRICLE RV S prime:     8.27 cm/s TAPSE (M-mode): 2.6 cm LEFT ATRIUM           Index  RIGHT ATRIUM           Index LA diam:      3.50 cm 2.04 cm/m   RA Area:     10.70 cm LA Vol (A4C): 24.9 ml 14.53 ml/m  RA Volume:   20.00 ml  11.67 ml/m  AORTIC VALVE                    PULMONIC VALVE AV Area (Vmax):    2.86 cm     PV Vmax:          0.57 m/s AV Area (Vmean):   2.72 cm     PV Peak grad:     1.3 mmHg AV Area (VTI):     2.98 cm     PR End Diast Vel: 2.26 msec AV Vmax:           97.20 cm/s AV Vmean:          66.000 cm/s AV VTI:            0.152 m AV Peak Grad:      3.8 mmHg AV Mean Grad:      2.0 mmHg LVOT Vmax:         88.60 cm/s LVOT Vmean:        57.100 cm/s LVOT VTI:          0.144 m LVOT/AV VTI ratio: 0.95  AORTA Ao Root diam: 3.20 cm Ao Asc diam:  3.60 cm MITRAL VALVE MV Area (PHT): 4.60 cm    SHUNTS MV Decel Time: 165 msec    Systemic VTI:  0.14 m MV E velocity: 82.30 cm/s  Systemic Diam: 2.00 cm MV A velocity: 94.30 cm/s MV E/A ratio:  0.87 Dwayne D Callwood MD Electronically signed by Alwyn Pea MD Signature Date/Time: 08/08/2022/11:46:15 AM    Final    US Venous Img Lower Unilateral Left (DVT)  Result Date: 08/07/2022 CLINICAL DATA:  Left lower extremity edema. EXAM: LEFT LOWER EXTREMITY VENOUS DOPPLER ULTRASOUND TECHNIQUE: Gray-scale sonography with compression, as well as color and duplex ultrasound, were performed to evaluate the deep venous system(s) from the level of the common femoral vein through the popliteal and proximal calf veins. COMPARISON:  None Available. FINDINGS: VENOUS Normal compressibility of the common femoral, superficial femoral, and popliteal veins, as well as the visualized calf veins. Visualized portions of profunda femoral vein and great saphenous vein unremarkable. No filling defects to suggest DVT on grayscale or color Doppler imaging. Doppler waveforms show normal direction of venous flow, normal respiratory plasticity and response to augmentation. Limited views of the contralateral common femoral vein are unremarkable. OTHER Fluid collection in  the left popliteal fossa measures 2.5 by 0.9 x 2.6 cm. Limitations: None IMPRESSION: 1. No evidence for left lower extremity DVT. 2. Small left Baker's cyst. Electronically Signed   By: Signa Kell M.D.   On: 08/07/2022 18:01   CT Angio Chest PE W and/or Wo Contrast  Result Date: 08/07/2022 CLINICAL DATA:  PE suspected lung cancer * Tracking Code: BO * EXAM: CT ANGIOGRAPHY CHEST WITH CONTRAST TECHNIQUE: Multidetector CT imaging of the chest was performed using the standard protocol during bolus administration of intravenous contrast. Multiplanar CT image reconstructions and MIPs were obtained to evaluate the vascular anatomy. RADIATION DOSE REDUCTION: This exam was performed according to the departmental dose-optimization program which includes automated exposure control, adjustment of the mA and/or kV according to patient size and/or use of iterative reconstruction technique. CONTRAST:  75mL OMNIPAQUE  IOHEXOL 350 MG/ML SOLN COMPARISON:  PET-CT, 05/30/2022 FINDINGS: Cardiovascular: Satisfactory opacification of the pulmonary arteries to the segmental level. No evidence of pulmonary embolism. Cardiomegaly. Left and right coronary artery calcifications. Moderate pericardial effusion, increased compared to prior examination. Right chest port catheter. Aortic atherosclerosis Mediastinum/Nodes: Unchanged matted soft tissue throughout the mediastinum and hila without discretely enlarged lymph nodes. Thyroid gland, trachea, and esophagus demonstrate no significant findings. Lungs/Pleura: Small right pleural effusion and associated atelectasis or consolidation, improved compared to prior examination. Similar post treatment appearance of the chest, with dense paramedian perihilar consolidation fibrosis of the lungs, with some interval evolution of radiation fibrosis. Moderate underlying emphysema. Upper Abdomen: No acute abnormality. Musculoskeletal: No chest wall abnormality. No acute osseous findings. Review of the  MIP images confirms the above findings. IMPRESSION: 1. Negative examination for pulmonary embolism. 2. Similar post treatment appearance of the chest, with dense paramedian perihilar consolidation fibrosis of the lungs, with some interval evolution of radiation fibrosis. 3. Small right pleural effusion and associated atelectasis or consolidation, improved compared to prior examination. 4. Unchanged matted soft tissue throughout the mediastinum and hila without discretely enlarged lymph nodes. 5. Cardiomegaly and coronary artery disease. 6. Moderate pericardial effusion, increased compared to prior examination. Aortic Atherosclerosis (ICD10-I70.0). Electronically Signed   By: Jearld Lesch M.D.   On: 08/07/2022 14:58   DG Chest Port 1 View  Result Date: 08/07/2022 CLINICAL DATA:  [id number] SOB (shortness of breath) [id number] EXAM: PORTABLE CHEST - 1 VIEW COMPARISON:  07/18/2022 FINDINGS: Persistent moderate right pleural effusion with adjacent opacities at the right lung base. Coarse non anatomic opacities at the right hilum slightly more conspicuous. Right IJ port catheter stable, tip distal SVC. Coarse left perihilar interstitial opacities. Heart size and mediastinal contours are within normal limits. Aortic Atherosclerosis (ICD10-170.0). No pneumothorax. Cervical fixation hardware partially visualized. IMPRESSION: Persistent moderate right pleural effusion with adjacent right lower lung atelectasis/infiltrate. Electronically Signed   By: Corlis Leak M.D.   On: 08/07/2022 12:25   DG Chest 2 View  Result Date: 07/18/2022 CLINICAL DATA:  shortness of breath; pleural effusion EXAM: CHEST - 2 VIEW COMPARISON:  06/23/2022 FINDINGS: Chest port catheter tip overlies the distal SVC. Unchanged cardiomediastinal silhouette. There is a persistent small right pleural effusion with adjacent masslike architectural distortion, fibrosis, and volume loss, similar to prior exam. Trace left pleural effusion, similar to prior, with  adjacent left basilar atelectasis/scarring. No new airspace disease. No evidence of pneumothorax. No acute osseous abnormality. Partially visualized cervical spine fusion hardware. IMPRESSION: Unchanged small right pleural effusion and adjacent posttreatment pulmonary parenchymal changes. Unchanged trace left pleural effusion. No new airspace disease. Electronically Signed   By: Caprice Renshaw M.D.   On: 07/18/2022 11:04    PERFORMANCE STATUS (ECOG) : 1 - Symptomatic but completely ambulatory  Review of Systems Unless otherwise noted, a complete review of systems is negative.  Physical Exam General: NAD Cardiovascular: regular rate and rhythm Pulmonary: clear ant fields Abdomen: soft, nontender, + bowel sounds GU: no suprapubic tenderness Extremities: no edema, no joint deformities Skin: no rashes Neurological: Weakness but otherwise nonfocal  IMPRESSION/PLAN: Recurrent locally advanced non-small cell lung cancer -on Alimta. Labs stable today and patient is stable appearing. CTA 08/07/22 showed posttreatment changes with interval development of radiation fibrosis. She is scheduled for cycle #4 Alimta on 6/10.  Wheezing-will refill DuoNebs per patient request.  Follow-up with pulmonary as scheduled later this month.  Shortness of breath -patient symptomatically improved since discharging from the hospital.  She is  not currently utilizing her O2 and was encouraged to do so as needed.  Sternal pain - chronic problem and patient states cardiology is aware. Likely secondary to known pericardial effusion. Recommend follow up with cardiology. ED triggers reviewed.  Case and plan discussed with Dr. Smith Robert. Patient to follow-up with Dr. Smith Robert as scheduled next week  Patient expressed understanding and was in agreement with this plan. She also understands that She can call clinic at any time with any questions, concerns, or complaints.   Thank you for allowing me to participate in the care of this very  pleasant patient.   Time Total: 20 minutes  Visit consisted of counseling and education dealing with the complex and emotionally intense issues of symptom management in the setting of serious illness.Greater than 50%  of this time was spent counseling and coordinating care related to the above assessment and plan.  Signed by: Laurette Schimke, PhD, NP-C

## 2022-08-18 ENCOUNTER — Other Ambulatory Visit: Payer: Self-pay

## 2022-08-18 ENCOUNTER — Emergency Department: Payer: Medicare Other

## 2022-08-18 ENCOUNTER — Emergency Department
Admission: EM | Admit: 2022-08-18 | Discharge: 2022-08-19 | Disposition: A | Discharge status: Home / Self Care | Payer: Medicare Other | Attending: Emergency Medicine | Admitting: Emergency Medicine

## 2022-08-18 DIAGNOSIS — J45909 Unspecified asthma, uncomplicated: Secondary | ICD-10-CM | POA: Insufficient documentation

## 2022-08-18 DIAGNOSIS — J90 Pleural effusion, not elsewhere classified: Secondary | ICD-10-CM | POA: Diagnosis not present

## 2022-08-18 DIAGNOSIS — I700 Atherosclerosis of aorta: Secondary | ICD-10-CM | POA: Diagnosis not present

## 2022-08-18 DIAGNOSIS — R079 Chest pain, unspecified: Secondary | ICD-10-CM | POA: Diagnosis present

## 2022-08-18 DIAGNOSIS — D72819 Decreased white blood cell count, unspecified: Secondary | ICD-10-CM | POA: Insufficient documentation

## 2022-08-18 DIAGNOSIS — I4892 Unspecified atrial flutter: Secondary | ICD-10-CM | POA: Diagnosis not present

## 2022-08-18 DIAGNOSIS — I10 Essential (primary) hypertension: Secondary | ICD-10-CM | POA: Insufficient documentation

## 2022-08-18 DIAGNOSIS — J441 Chronic obstructive pulmonary disease with (acute) exacerbation: Secondary | ICD-10-CM | POA: Insufficient documentation

## 2022-08-18 DIAGNOSIS — R5383 Other fatigue: Secondary | ICD-10-CM | POA: Insufficient documentation

## 2022-08-18 DIAGNOSIS — J449 Chronic obstructive pulmonary disease, unspecified: Secondary | ICD-10-CM | POA: Insufficient documentation

## 2022-08-18 LAB — TROPONIN I (HIGH SENSITIVITY)
Troponin I (High Sensitivity): 10 ng/L (ref <18)
Troponin I (High Sensitivity): 12 ng/L (ref <18)

## 2022-08-18 LAB — CBC
HCT: 30.8 % — ABNORMAL LOW (ref 36.0–46.0)
Hemoglobin: 10 g/dL — ABNORMAL LOW (ref 12.0–15.0)
MCH: 30.9 pg (ref 26.0–34.0)
MCHC: 32.5 g/dL (ref 30.0–36.0)
MCV: 95.1 fL (ref 80.0–100.0)
Platelets: 442 10*3/uL — ABNORMAL HIGH (ref 150–400)
RBC: 3.24 MIL/uL — ABNORMAL LOW (ref 3.87–5.11)
RDW: 16.9 % — ABNORMAL HIGH (ref 11.5–15.5)
WBC: 5.1 10*3/uL (ref 4.0–10.5)
nRBC: 0 % (ref 0.0–0.2)

## 2022-08-18 LAB — BASIC METABOLIC PANEL
Anion gap: 12 (ref 5–15)
BUN: 25 mg/dL — ABNORMAL HIGH (ref 8–23)
CO2: 31 mmol/L (ref 22–32)
Calcium: 9.4 mg/dL (ref 8.9–10.3)
Chloride: 95 mmol/L — ABNORMAL LOW (ref 98–111)
Creatinine, Ser: 0.75 mg/dL (ref 0.44–1.00)
GFR, Estimated: >60 mL/min (ref >60)
Glucose, Bld: 142 mg/dL — ABNORMAL HIGH (ref 70–99)
Potassium: 4.3 mmol/L (ref 3.5–5.1)
Sodium: 138 mmol/L (ref 135–145)

## 2022-08-18 LAB — BRAIN NATRIURETIC PEPTIDE: B Natriuretic Peptide: 226.4 pg/mL — ABNORMAL HIGH (ref 0.0–100.0)

## 2022-08-18 MED ORDER — APIXABAN 5 MG PO TABS: 5.0000 mg | ORAL_TABLET | Freq: Two times a day (BID) | ORAL | 0 refills | Status: AC

## 2022-08-18 MED ORDER — METOPROLOL TARTRATE 25 MG PO TABS
25.0000 mg | ORAL_TABLET | Freq: Once | ORAL | Status: AC
  Administered 2022-08-18: 25 mg via ORAL
  Filled 2022-08-18: qty 1

## 2022-08-18 MED ORDER — APIXABAN 5 MG PO TABS
5.0000 mg | ORAL_TABLET | Freq: Once | ORAL | Status: AC
  Administered 2022-08-19: 5 mg via ORAL
  Filled 2022-08-18: qty 1

## 2022-08-18 NOTE — ED Triage Notes (Addendum)
Pt presents ambulatory to triage via POV with complaints of R sided CP with associated SOB x3 hours.  Pt has hx of stage 3 lung cancer on active chemo. Pt is tachypneic in triage - she informs this RN she is supposed to be wearing 3L but forgot her O2 at home. RA sats were 88%; placed on her baseline 3L [place]. A&Ox4 at this time. Denies fevers, chills, cough.

## 2022-08-18 NOTE — Discharge Instructions (Addendum)
You went into a rhythm called atrial flutter. This puts you at risk for stroke, so we are starting you on eliquis.   Please start taking the metoprolol 12.5 twice daily.   I would like you to follow-up with cardiology in the next several days. We have placed an urgent referral. If you do not hear from the office, call the number above to schedule an appointment.

## 2022-08-18 NOTE — ED Provider Notes (Addendum)
[facility] Provider Note    Event Date/Time   First MD Initiated Contact with Patient 08/18/22 2129     (approximate)   History   Chest Pain   HPI  Jennifer Hodges is a 77 y.o. female medical history of COPD on 3 L nasal cannula at baseline, vocal cord cyst, stage III adenocarcinoma who presents because of chest pain.  This evening patient felt like her heart rate was racing and it was in the 140s when she checked it.  Blood pressure was also significantly elevated.  She is having intermittent pains in the right lung which she describes as aching pressure-like feeling comes and goes.  She has been having this for a while.  It is nonpleuritic not clearly exertional she is not having it currently.  Last several days has felt somewhat fatigued he has not had fevers or chills or urinary symptoms.  Was constipated earlier this week but this is resolving after taking stool softeners.  Last month for SVT.  Was started on 12.5 twice daily of metoprolol by cardiology.  She took the metoprolol this evening when her heart rates been high.     Past Medical History:  Diagnosis Date   Anemia    Asthma    No Inhalers--Dr. Meredeth Ide will order as needed   Bronchiectasis (HCC)    mild   Chronic headaches     followed by [facility] migraines   COPD (chronic obstructive pulmonary disease) (HCC)    DDD (degenerative disc disease), lumbar    Diverticulosis    Family history of adverse reaction to anesthesia    mother and sisters-PONV   Gall stones    history of   GERD (gastroesophageal reflux disease)    EGD 8/09- non bleeding erosive gastritis, documentd esophageal ulcerations.    Hiatal hernia    small   History of kidney stones    History of pneumonia    Hypercholesterolemia    IBS (irritable bowel syndrome)    Malignant neoplasm of upper lobe of left lung (HCC) 12/27/2018   Meniere disease    Murmur    Osteoarthritis    lumbar disc disease, left hip    Personal history of chemotherapy    Personal history of radiation therapy    Pneumonia 12/2020   PONV (postoperative nausea and vomiting)    Sleep apnea    uses cpap   Vertigo    Weakness of right side of body     Patient Active Problem List   Diagnosis Date Noted   SVT (supraventricular tachycardia) 08/07/2022   Pericardial effusion 08/07/2022   Generalized weakness 08/07/2022   AKI (acute kidney injury) (HCC) 08/07/2022   Electrolyte abnormality 08/07/2022   Chronic hypoxic respiratory failure (HCC) 08/07/2022   Nausea without vomiting 07/18/2022   Pneumonia 04/05/2022   COPD with acute exacerbation (HCC) 04/05/2022   Palliative care encounter 02/08/2022   Pleural effusion on right 02/05/2022   Lobar pneumonia (HCC) 02/04/2022   Aspiration pneumonia (HCC) 02/04/2022   Bilateral leg edema 01/20/2022   Edema of lower extremity 06/16/2021   Chronic anxiety 06/16/2021   Cervical post-laminectomy syndrome 06/16/2021   Adenocarcinoma of lung (HCC) 06/16/2021   Anemia 05/26/2021   Postobstructive pneumonia 01/06/2021   Benign breast lumps 01/11/2019   Gout 01/11/2019   Hives 01/11/2019   Lung disease 01/11/2019   Migraine headache 01/11/2019   Goals of care, counseling/discussion 12/27/2018   Malignant neoplasm of upper lobe of left lung (HCC)  12/27/2018   Night sweats 08/07/2016   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   Osteopenia of multiple sites 04/27/2016   Left carpal tunnel syndrome 02/02/2016   Carotid stenosis, asymptomatic, bilateral 12/29/2015   Prediabetes 09/28/2015   Primary osteoarthritis involving multiple joints 09/28/2015   Knee pain 05/11/2015   Chest tightness 03/24/2015   Stool incontinence 03/24/2015   Urinary frequency 03/04/2015   Cervical spondylosis with radiculopathy 02/24/2015   Pre-syncope 02/19/2015   URI (upper respiratory infection) 02/15/2015   Neck pain 12/25/2014   Pelvic pain in female 11/16/2014   Primary osteoarthritis of hip  08/05/2014   Heel pain 06/01/2014   Diarrhea 06/01/2014   Health care maintenance 06/01/2014   Right leg pain 03/30/2014   Right arm pain 03/30/2014   Sinusitis 03/30/2014   Internal nasal lesion 11/12/2013   Other specified disorders of nose and nasal sinuses 11/12/2013   Chronic tension-type headache, intractable 10/30/2013   Intractable migraine without aura and without status migrainosus 10/30/2013   Occipital neuralgia of left side 10/30/2013   IBS (irritable bowel syndrome) 09/03/2013   Rib pain 08/04/2013   Cervical radiculitis 08/01/2013   Lumbar radiculitis 08/01/2013   Palpitations 07/25/2013   Benign essential hypertension 05/28/2013   Neuralgia, neuritis, and radiculitis, unspecified 05/28/2013   Vitamin D deficiency 05/28/2013   Elevated AFP 04/28/2013   Abnormality of alpha-fetoprotein 04/28/2013   Osteoporosis 11/12/2012   Incomplete emptying of bladder 07/11/2012   Prolapse of vaginal vault after hysterectomy 07/11/2012   Nonspecific (abnormal) findings on radiological and other examination of gastrointestinal tract 05/31/2012   Bronchiectasis (HCC) 02/05/2012   Sleep apnea 02/05/2012   Degenerative disc disease, cervical 02/05/2012   Hypercholesterolemia 02/05/2012   GERD (gastroesophageal reflux disease) 02/05/2012   Chronic headaches 02/05/2012     Physical Exam  Triage Vital Signs: ED Triage Vitals  Enc Vitals Group     BP 08/18/22 1924 123/75     Pulse Rate 08/18/22 1924 (!) 114     Resp 08/18/22 1924 (!) 22     Temp 08/18/22 1924 98.5 F (36.9 C)     Temp Source 08/18/22 1924 Oral     SpO2 08/18/22 1924 97 %     Weight 08/18/22 1928 154 lb 0.9 oz (69.9 kg)     Height 08/18/22 1928 5\' 1"  (1.549 m)     Head Circumference --      Peak Flow --      Pain Score 08/18/22 1925 5     Pain Loc --      Pain Edu? --      Excl. in GC? --     Most recent vital signs: Vitals:   08/18/22 2315 08/18/22 2327  BP:    Pulse: (!) 143   Resp: 19   Temp:   98.3 F (36.8 C)  SpO2: 100%      General: Awake, no distress.  CV:  Good peripheral perfusion.  No lower extremity swelling or pain or asymmetry Resp:  Normal effort.  Lung sounds are no wheezing no increased work of breathing Abd:  No distention.  Abdomen is soft and nontender throughout Neuro:             Awake, Alert, Oriented x 3  Other:     ED Results / Procedures / Treatments  Labs (all labs ordered are listed, but only abnormal results are displayed) Labs Reviewed  BASIC METABOLIC PANEL - Abnormal; Notable for the following components:      Result Value  Chloride 95 (*)    Glucose, Bld 142 (*)    BUN 25 (*)    All other components within normal limits  CBC - Abnormal; Notable for the following components:   RBC 3.24 (*)    Hemoglobin 10.0 (*)    HCT 30.8 (*)    RDW 16.9 (*)    Platelets 442 (*)    All other components within normal limits  BRAIN NATRIURETIC PEPTIDE - Abnormal; Notable for the following components:   B Natriuretic Peptide 226.4 (*)    All other components within normal limits  TROPONIN I (HIGH SENSITIVITY)  TROPONIN I (HIGH SENSITIVITY)     EKG  Reviewed interpreted patient's EKG which shows sinus tachycardia low-voltage acute ischemic changes   RADIOLOGY   I reviewed and interpreted patient's chest x-ray which shows stable right-sided pleural effusion  PROCEDURES:  Critical Care performed: No  .1-3 Lead EKG Interpretation  Performed by: Georga Hacking, MD Authorized by: Georga Hacking, MD     Interpretation: normal     ECG rate assessment: normal     Rhythm: sinus rhythm     Ectopy: none     Conduction: normal     The patient is on the cardiac monitor to evaluate for evidence of arrhythmia and/or significant heart rate changes.   MEDICATIONS ORDERED IN ED: Medications  metoprolol tartrate (LOPRESSOR) tablet 25 mg (has no administration in time range)  apixaban (ELIQUIS) tablet 5 mg (has no administration in time  range)     IMPRESSION / MDM / ASSESSMENT AND PLAN / ED COURSE  I reviewed the triage vital signs and the nursing notes.                              Patient's presentation is most consistent with acute presentation with potential threat to life or bodily function.  Differential diagnosis includes, but is not limited to, pneumonia, pneumothorax, PE, bronchospasm, recurrent SVT, ACS, pericardial effusion, pericardial  The patient is a 77 yo female with multiple medical comorbidities including COPD, adenocarcinoma of the lung for which she is currently undergoing chemotherapy presents today because of elevated heart rate and chest pain.  Her chest pain has been an ongoing issue and is intermittent primarily right-sided nonpleuritic and nonexertional.  She did progress today tells me this is pretty stable.  This afternoon she felt like her heart was racing and when she checked it was in the 140s blood pressure was also elevated which is the main reason she came to cardiology.  She was recently hospitalized late last month for SVT which responded to diltiazem.  Started low-dose metoprolol by cardiology.  On arrival to the ED patient's heart rate is in the 110s respiratory rate 22 she is saturating well on her baseline 3 L.  When I evaluated the patient heart rate is in 90s, she is breathing comfortably without hypoxia.  Lung sounds relatively clear.  She does not have signs of volume overload or DVT.  Chest x-ray was obtained which showed stable pleural effusion, scarring. Troponins x 2 were negative.  EKG is nonischemic and is sinus tachycardia.  CBC is reassuring with leukopenia and hemoglobin is uptrending.  Overall given patient is feeling improved without symptoms to suggest ACS and with stable chest x-ray and vital signs I do feel that she can be discharged.  Consider diagnosis of PE but patient's chest pain has been an ongoing issue she had a  CTA last admission just several days ago when she was  having similar pain that was negative for PE, so despite her being at higher risk given her cancer feel the utility of repeating this today because well.  Discussed with patient possibility of going up on her metoprolol.  She tells me when she took the full 25 mg she had significant headache and that she is hesitant to increase the dose.  Given her heart rate has improved it is okay to keep at the 12.5 especially since she has been stable at this dose since discharge.  I did discuss with her that if she has recurrent episodes of SVT a dose will likely need to be increased.  Patient was about to be discharged but was then noted to have heart rates in the 160s.  EKG at this time looks to be atrial flutter.  This was very brief and then she went back to sinus rhythm.  Given this I did discuss anticoagulation with the patient that she is okay with starting Eliquis.  Discussed risks with bleeding and the importance of returning to ED if she were to have any trauma or bleeding.  On further history patient has stopped taking the metoprolol twice a day and decreased it to just in the morning.  This could be why she is now having recurrent arrhythmia.  Will give her a dose of 25 mg metoprolol now.  Will plan to observe to ensure she is staying in sinus rhythm or staying rate controlled.  Advised the patient to resume 12.5 twice daily.  She has an appointment with cardiology at the end of the month.  I placed the urgent cardiology referral so that she can hopefully be seen sooner.  Patient would still like to be discharged possible.  Signed out to oncoming provider pending period of observation.    FINAL CLINICAL IMPRESSION(S) / ED DIAGNOSES   Final diagnoses:  Atrial flutter, unspecified type (HCC)     Rx / DC Orders   ED Discharge Orders          Ordered    Ambulatory referral to Cardiology       Comments: If you have not heard from the Cardiology office within the next 72 hours please call [phone].    08/18/22 2332    apixaban (ELIQUIS) 5 MG TABS tablet  2 times daily        08/18/22 2332             Note:  This document was prepared using Dragon voice recognition software and may include unintentional dictation errors.   Georga Hacking, MD 08/18/22 2252    Georga Hacking, MD 08/18/22 [phone]

## 2022-08-19 ENCOUNTER — Telehealth: Payer: Self-pay | Admitting: *Deleted

## 2022-08-19 ENCOUNTER — Other Ambulatory Visit: Payer: Self-pay | Admitting: Oncology

## 2022-08-19 DIAGNOSIS — C3412 Malignant neoplasm of upper lobe, left bronchus or lung: Secondary | ICD-10-CM

## 2022-08-19 DIAGNOSIS — I4892 Unspecified atrial flutter: Secondary | ICD-10-CM | POA: Diagnosis not present

## 2022-08-19 MED FILL — Dexamethasone Sodium Phosphate Inj 100 MG/10ML: INTRAMUSCULAR | Qty: 1 | Status: AC

## 2022-08-19 NOTE — Telephone Encounter (Signed)
Can you call her? I can postpone rx tentatively by 1 week

## 2022-08-19 NOTE — ED Provider Notes (Signed)
-----------------------------------------   1:05 AM on 08/19/2022 -----------------------------------------   HR 90.  Patient feeling significantly better.  Will prepare to discharge slowly.  ----------------------------------------- 2:03 AM on 08/19/2022 -----------------------------------------   Addendum on chart review:  Patient was discharged without incident in good and stable condition.  She will follow-up closely with her cardiologist.  Strict return precautions were given.  Patient and spouse verbalized understanding and agreed with plan of care.   Irean Hong, MD 08/19/22 [phone]

## 2022-08-19 NOTE — Telephone Encounter (Signed)
I spoke with patient and reviewed plan to delay treatment for 1 week. She says she was happy about this news.

## 2022-08-19 NOTE — Telephone Encounter (Signed)
Patient called reporting that she had to go to the ER las night for her heart. She states that as she was preparing to leave, her HR went up to 200 prompting intervention and she was finally discharged with lopressor and blood thinner, and told to follow up with cardiaology. She states that she has treatment scheduled for [place] and she is unsure she should take treatment at this time as hse is very weak form this episode and she would like to discuss this with Dr Smith Robert please

## 2022-08-20 ENCOUNTER — Other Ambulatory Visit: Payer: Self-pay

## 2022-08-22 ENCOUNTER — Inpatient Hospital Stay: Payer: Medicare Other

## 2022-08-22 ENCOUNTER — Inpatient Hospital Stay: Payer: Medicare Other | Admitting: Oncology

## 2022-08-24 ENCOUNTER — Other Ambulatory Visit: Payer: Self-pay | Admitting: *Deleted

## 2022-08-24 MED ORDER — IPRATROPIUM-ALBUTEROL 0.5-2.5 (3) MG/3ML IN SOLN: 3.0000 mL | Freq: Four times a day (QID) | RESPIRATORY_TRACT | 0 refills | Status: DC | PRN

## 2022-08-25 ENCOUNTER — Other Ambulatory Visit: Payer: Self-pay

## 2022-08-26 MED FILL — Dexamethasone Sodium Phosphate Inj 100 MG/10ML: INTRAMUSCULAR | Qty: 1 | Status: AC

## 2022-08-29 ENCOUNTER — Encounter: Payer: Self-pay | Admitting: Oncology

## 2022-08-29 ENCOUNTER — Inpatient Hospital Stay: Payer: Medicare Other

## 2022-08-29 ENCOUNTER — Inpatient Hospital Stay (HOSPITAL_BASED_OUTPATIENT_CLINIC_OR_DEPARTMENT_OTHER): Payer: Medicare Other | Admitting: Oncology

## 2022-08-29 VITALS — BP 111/66 | HR 96 | Temp 96.3°F | Resp 18 | Ht 61.0 in | Wt 157.6 lb

## 2022-08-29 DIAGNOSIS — D649 Anemia, unspecified: Secondary | ICD-10-CM

## 2022-08-29 DIAGNOSIS — Z5111 Encounter for antineoplastic chemotherapy: Secondary | ICD-10-CM

## 2022-08-29 DIAGNOSIS — C3412 Malignant neoplasm of upper lobe, left bronchus or lung: Secondary | ICD-10-CM

## 2022-08-29 DIAGNOSIS — C3490 Malignant neoplasm of unspecified part of unspecified bronchus or lung: Secondary | ICD-10-CM | POA: Diagnosis not present

## 2022-08-29 LAB — COMPREHENSIVE METABOLIC PANEL
ALT: 10 U/L (ref 0–44)
AST: 21 U/L (ref 15–41)
Albumin: 3.6 g/dL (ref 3.5–5.0)
Alkaline Phosphatase: 93 U/L (ref 38–126)
Anion gap: 9 (ref 5–15)
BUN: 22 mg/dL (ref 8–23)
CO2: 29 mmol/L (ref 22–32)
Calcium: 9.3 mg/dL (ref 8.9–10.3)
Chloride: 98 mmol/L (ref 98–111)
Creatinine, Ser: 0.74 mg/dL (ref 0.44–1.00)
GFR, Estimated: >60 mL/min (ref >60)
Glucose, Bld: 105 mg/dL — ABNORMAL HIGH (ref 70–99)
Potassium: 4 mmol/L (ref 3.5–5.1)
Sodium: 136 mmol/L (ref 135–145)
Total Bilirubin: 0.3 mg/dL (ref 0.3–1.2)
Total Protein: 7.1 g/dL (ref 6.5–8.1)

## 2022-08-29 LAB — CBC WITH DIFFERENTIAL/PLATELET
Abs Immature Granulocytes: 0.01 10*3/uL (ref 0.00–0.07)
Basophils Absolute: 0 10*3/uL (ref 0.0–0.1)
Basophils Relative: 1 %
Eosinophils Absolute: 0.1 10*3/uL (ref 0.0–0.5)
Eosinophils Relative: 2 %
HCT: 31.9 % — ABNORMAL LOW (ref 36.0–46.0)
Hemoglobin: 10.3 g/dL — ABNORMAL LOW (ref 12.0–15.0)
Immature Granulocytes: 0 %
Lymphocytes Relative: 10 %
Lymphs Abs: 0.6 10*3/uL — ABNORMAL LOW (ref 0.7–4.0)
MCH: 30.1 pg (ref 26.0–34.0)
MCHC: 32.3 g/dL (ref 30.0–36.0)
MCV: 93.3 fL (ref 80.0–100.0)
Monocytes Absolute: 0.5 10*3/uL (ref 0.1–1.0)
Monocytes Relative: 10 %
Neutro Abs: 4.4 10*3/uL (ref 1.7–7.7)
Neutrophils Relative %: 77 %
Platelets: 354 10*3/uL (ref 150–400)
RBC: 3.42 MIL/uL — ABNORMAL LOW (ref 3.87–5.11)
RDW: 15.5 % (ref 11.5–15.5)
WBC: 5.7 10*3/uL (ref 4.0–10.5)
nRBC: 0 % (ref 0.0–0.2)

## 2022-08-29 LAB — VITAMIN B12: Vitamin B-12: 995 pg/mL — ABNORMAL HIGH (ref 180–914)

## 2022-08-29 LAB — IRON AND TIBC
Iron: 50 ug/dL (ref 28–170)
Saturation Ratios: 13 % (ref 10.4–31.8)
TIBC: 386 ug/dL (ref 250–450)
UIBC: 336 ug/dL

## 2022-08-29 LAB — FERRITIN: Ferritin: 390 ng/mL — ABNORMAL HIGH (ref 11–307)

## 2022-08-29 LAB — FOLATE: Folate: 10.7 ng/mL (ref >5.9)

## 2022-08-29 MED ORDER — SODIUM CHLORIDE 0.9 % IV SOLN
Freq: Once | INTRAVENOUS | Status: AC
  Filled 2022-08-29: qty 250

## 2022-08-29 MED ORDER — SODIUM CHLORIDE 0.9 % IV SOLN
500.0000 mg/m2 | Freq: Once | INTRAVENOUS | Status: AC
  Administered 2022-08-29: 900 mg via INTRAVENOUS
  Filled 2022-08-29: qty 20

## 2022-08-29 MED ORDER — CYANOCOBALAMIN 1000 MCG/ML IJ SOLN
1000.0000 ug | Freq: Once | INTRAMUSCULAR | Status: AC
  Administered 2022-08-29: 1000 ug via INTRAMUSCULAR
  Filled 2022-08-29: qty 1

## 2022-08-29 MED ORDER — HEPARIN SOD (PORK) LOCK FLUSH 100 UNIT/ML IV SOLN
500.0000 [IU] | Freq: Once | INTRAVENOUS | Status: AC | PRN
  Administered 2022-08-29: 500 [IU]
  Filled 2022-08-29: qty 5

## 2022-08-29 MED ORDER — SODIUM CHLORIDE 0.9 % IV SOLN
10.0000 mg | Freq: Once | INTRAVENOUS | Status: AC
  Administered 2022-08-29: 10 mg via INTRAVENOUS
  Filled 2022-08-29: qty 10
  Filled 2022-08-29: qty 1

## 2022-08-29 NOTE — Progress Notes (Signed)
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  EGFR and Ros as well as MET testing negative. PD-L1 was 50%   Patient completed concurrent chemoradiation with weekly carbotaxol chemotherapy on 03/12/2019.  Maintenance durvalumab started in January 2021 after scan showed partial response.  Patient completed 1 year of maintenance durvalumab in February 2022   Patient was found to have enlarging mediastinal adenopathyOn subsequent CT scan in January 2023.  This was followed by a PET scan which showed hypermetabolic mediastinal and right hilar lymph nodes but no other evidence of disease elsewhere.  This was biopsied and was consistent with non-small cell lung cancer specifically adenocarcinoma.  Cells were positive for TTF-1 and Napsin A and negative for p40.   Patient completed concurrent chemoradiation with 4 cycles of carbo Alimta ending in May 2023.  Scan showed partial response.  Plan is for maintenance Alimta until progression or toxicity.  Alimta was stopped in November 2023 due to progressive anemia and stable scans   Patient was hospitalized in November  2023 for acute on chronic hypoxic respiratory failure secondary to healthcare associated pneumonia.  She had pleural effusions which were tapped and thoracentesis was negative for malignancy.   Scans in March 2024 showed concern for progressive disease with appearance of new right lung nodules as well as progressive mediastinal adenopathy.  Also noted to have right pleural effusion which was tapped and was negative for malignancy.  Single agent Alimta was restarted  Interval history-patient was recently admitted to the hospital for weakness and tachycardia.  She was found to be in SVT and requiredMedical cardioversion.  She was found to have moderate pericardial effusion on CT scan but echocardiogram and as per cardiology assessment this was mild to moderate and therefore was not required to be tapped.  Presently she feels that her palpitations and shortness of breath is improved.  ECOG PS- 2 Pain scale- 0   Review of systems- Review of Systems  Constitutional:  Negative for chills, fever, malaise/fatigue and weight loss.  HENT:  Negative for congestion, ear discharge and nosebleeds.   Eyes:  Negative for blurred vision.  Respiratory:  Negative for cough, hemoptysis, sputum production, shortness of breath and wheezing.   Cardiovascular:  Negative for chest pain, palpitations, orthopnea and claudication.  Gastrointestinal:  Negative for abdominal pain, blood in stool, constipation, diarrhea, heartburn, melena, nausea and vomiting.  Genitourinary:  Negative for dysuria, flank pain, frequency, hematuria and urgency.  Musculoskeletal:  Negative for back pain, joint pain and myalgias.  Skin:  Negative for rash.  Neurological:  Negative for dizziness, tingling, focal weakness, seizures, weakness and headaches.  Endo/Heme/Allergies:  Does not bruise/bleed easily.  Psychiatric/Behavioral:  Negative for depression and suicidal ideas. The patient  does not have insomnia.       Allergies  Allergen  Reactions   Aspirin Hives, Shortness Of Breath and Other (See Comments)    Difficulty breathing   Celebrex [Celecoxib] Shortness Of Breath   Morphine And Codeine Nausea And Vomiting and Swelling   Adhesive [Tape] Other (See Comments)    Took top layer of skin off when removed.   Clarithromycin Nausea And Vomiting   Codeine Nausea And Vomiting   Darvon [Propoxyphene Hcl] Nausea And Vomiting   Demerol [Meperidine] Nausea And Vomiting   Flonase [Fluticasone Propionate] Other (See Comments)    Fungal infection in nose   Simvastatin Other (See Comments)    "caused ulcers in mouth, and fever"   Talwin [Pentazocine] Nausea And Vomiting     Past Medical History:  Diagnosis Date   Anemia    Asthma    No Inhalers--Dr. Meredeth Ide will order as needed   Bronchiectasis (HCC)    mild   Chronic headaches     followed by [facility] migraines   COPD (chronic obstructive pulmonary disease) (HCC)    DDD (degenerative disc disease), lumbar    Diverticulosis    Family history of adverse reaction to anesthesia    mother and sisters-PONV   Gall stones    history of   GERD (gastroesophageal reflux disease)    EGD 8/09- non bleeding erosive gastritis, documentd esophageal ulcerations.    Hiatal hernia    small   History of kidney stones    History of pneumonia    Hypercholesterolemia    IBS (irritable bowel syndrome)    Malignant neoplasm of upper lobe of left lung (HCC) 12/27/2018   Meniere disease    Murmur    Osteoarthritis    lumbar disc disease, left hip   Personal history of chemotherapy    Personal history of radiation therapy    Pneumonia 12/2020   PONV (postoperative nausea and vomiting)    Sleep apnea    uses cpap   Vertigo    Weakness of right side of body      Past Surgical History:  Procedure Laterality Date   ABDOMINAL HYSTERECTOMY  age 72   ANTERIOR CERVICAL DECOMP/DISCECTOMY FUSION N/A 02/24/2015   Procedure: CERVICAL FOUR-FIVE, CERVICAL FIVE-SIX, CERVICAL  SIX-SEVEN ANTERIOR CERVICAL DECOMPRESSION/DISCECTOMY FUSION ;  Surgeon: Lisbeth Renshaw, MD;  Location: [facility];  Service: Neurosurgery;  Laterality: N/A;  C45 C56 C67 anterior cervical decompression with fusion interbody prosthesis plating and bonegraft   APPENDECTOMY     BACK SURGERY  1988   4th lumbar fusion   BLADDER SURGERY N/A    with vaginal wall repair   BREAST CYST ASPIRATION Bilateral    neg   BREAST SURGERY Bilateral    cyst removed and reduction   CARDIAC CATHETERIZATION  2014   CARPAL TUNNEL RELEASE Right 02/11/2016   Procedure: CARPAL TUNNEL RELEASE;  Surgeon: Kennedy Bucker, MD;  Location: [facility];  Service: Orthopedics;  Laterality: Right;   CATARACT EXTRACTION W/ INTRAOCULAR LENS IMPLANT Bilateral 2015   CHOLECYSTECTOMY     EUS N/A 05/31/2012   Procedure: UPPER ENDOSCOPIC ULTRASOUND (EUS) LINEAR;  Surgeon: Rachael Fee, MD;  Location: [facility];  Service: Endoscopy;  Laterality: N/A;   EXCISIONAL HEMORRHOIDECTOMY     JOINT REPLACEMENT Bilateral    KNEE ARTHROSCOPY WITH LATERAL MENISECTOMY Right 07/07/2015   Procedure: KNEE ARTHROSCOPY WITH LATERAL MENISECTOMY, PARTIAL SYNOVECTOMY;  Surgeon: Kennedy Bucker, MD;  Location: [facility];  Service: Orthopedics;  Laterality: Right;  LUMBAR LAMINECTOMY     PORTA CATH INSERTION N/A 01/07/2019   Procedure: PORTA CATH INSERTION;  Surgeon: Annice Needy, MD;  Location: [facility];  Service: Cardiovascular;  Laterality: N/A;   REDUCTION MAMMAPLASTY  1990   RIGHT OOPHORECTOMY     TOTAL HIP ARTHROPLASTY Left 05/01/2014   Dr. Sheppard Evens   TOTAL HIP ARTHROPLASTY Right 08/05/2014   Procedure: TOTAL HIP ARTHROPLASTY ANTERIOR APPROACH;  Surgeon: Kennedy Bucker, MD;  Location: [facility];  Service: Orthopedics;  Laterality: Right;   ULNAR NERVE TRANSPOSITION Right 02/11/2016   Procedure: ULNAR NERVE DECOMPRESSION/TRANSPOSITION;  Surgeon: Kennedy Bucker, MD;  Location: [facility];  Service: Orthopedics;  Laterality: Right;    VIDEO BRONCHOSCOPY WITH ENDOBRONCHIAL ULTRASOUND Left 12/19/2018   Procedure: VIDEO BRONCHOSCOPY WITH ENDOBRONCHIAL ULTRASOUND, LEFT, SLEEP APNEA;  Surgeon: Vida Rigger, MD;  Location: [facility];  Service: Thoracic;  Laterality: Left;   VIDEO BRONCHOSCOPY WITH ENDOBRONCHIAL ULTRASOUND Right 05/14/2021   Procedure: VIDEO BRONCHOSCOPY WITH ENDOBRONCHIAL ULTRASOUND;  Surgeon: Salena Saner, MD;  Location: [facility];  Service: Pulmonary;  Laterality: Right;    Social History   Socioeconomic History   Marital status: Married    Spouse name: Not on file   Number of children: Not on file   Years of education: Not on file   Highest education level: Not on file  Occupational History   Not on file  Tobacco Use   Smoking status: Former    Packs/day: 1.00    Years: 40.00    Additional pack years: 0.00    Total pack years: 40.00    Types: Cigarettes    Quit date: 06/13/2007    Years since quitting: 15.2   Smokeless tobacco: Never  Vaping Use   Vaping Use: Never used  Substance and Sexual Activity   Alcohol use: No    Alcohol/week: 0.0 standard drinks of alcohol   Drug use: No   Sexual activity: Not on file  Other Topics Concern   Not on file  Social History Narrative   Not on file   Social Determinants of Health   Financial Resource Strain: Not on file  Food Insecurity: No Food Insecurity (08/08/2022)   Hunger Vital Sign    Worried About Running Out of Food in the Last Year: Never true    Ran Out of Food in the Last Year: Never true  Transportation Needs: No Transportation Needs (08/08/2022)   PRAPARE - Administrator, Civil Service (Medical): No    Lack of Transportation (Non-Medical): No  Physical Activity: Not on file  Stress: Not on file  Social Connections: Not on file  Intimate Partner Violence: Not At Risk (08/08/2022)   Humiliation, Afraid, Rape, and Kick questionnaire    Fear of Current or Ex-Partner: No    Emotionally Abused: No    Physically Abused: No     Sexually Abused: No    Family History  Problem Relation Age of Onset   Heart disease Mother        s/p stent   Hypertension Mother    Hypercholesterolemia Mother    Diabetes Father    Stomach cancer Other        uncle   Breast cancer Cousin    Breast cancer Paternal Aunt      Current Outpatient Medications:    acetaminophen (TYLENOL) 500 MG tablet, Take 500 mg by mouth 2 (two) times daily., Disp: , Rfl:    apixaban (ELIQUIS) 5 MG TABS tablet, Take 1  tablet (5 mg total) by mouth 2 (two) times daily., Disp: 60 tablet, Rfl: 0   Bioflavonoid Products (ESTER C PO), Take 1 tablet by mouth daily. 500 mg, Disp: , Rfl:    Calcium-Magnesium-Zinc (CAL-MAG-ZINC PO), Take 1 tablet by mouth daily., Disp: , Rfl:    cyclobenzaprine (FLEXERIL) 10 MG tablet, Take 10 mg by mouth at bedtime., Disp: , Rfl:    ezetimibe (ZETIA) 10 MG tablet, Take 5 mg by mouth in the morning and at bedtime., Disp: , Rfl:    glycerin adult 2 g suppository, Place 1 suppository rectally as needed for constipation., Disp: , Rfl:    Histamine Dihydrochloride (AUSTRALIAN DREAM ARTHRITIS) 0.025 % CREA, Apply 1 application  topically 4 (four) times daily as needed (pain)., Disp: , Rfl:    HYDROcodone-acetaminophen (NORCO/VICODIN) 5-325 MG tablet, Take 1 tablet by mouth at bedtime., Disp: 30 tablet, Rfl: 0   ipratropium-albuterol (DUONEB) 0.5-2.5 (3) MG/3ML SOLN, Take 3 mLs by nebulization every 6 (six) hours as needed., Disp: 360 mL, Rfl: 0   loratadine (CLARITIN) 10 MG tablet, Take 10 mg by mouth daily., Disp: , Rfl:    metoprolol tartrate (LOPRESSOR) 25 MG tablet, Take 0.5 tablets (12.5 mg total) by mouth 2 (two) times daily., Disp: 30 tablet, Rfl: 2   ondansetron (ZOFRAN) 8 MG tablet, Take 1 tablet (8 mg total) by mouth every 8 (eight) hours as needed for nausea or vomiting., Disp: 20 tablet, Rfl: 0   pantoprazole (PROTONIX) 40 MG tablet, Take 40 mg by mouth 2 (two) times daily., Disp: , Rfl:    Polyethyl Glyc-Propyl Glyc  PF (SYSTANE HYDRATION PF) 0.4-0.3 % SOLN, Place 1 drop into both eyes as needed. as, Disp: , Rfl:    polyethylene glycol powder (GLYCOLAX/MIRALAX) 17 GM/SCOOP powder, Take 17 g by mouth daily as needed for moderate constipation., Disp: , Rfl:    Sennosides (SENNA) 8.6 MG CAPS, Take 1 capsule by mouth daily as needed (constipation)., Disp: , Rfl:    spironolactone (ALDACTONE) 50 MG tablet, Hold until followup with outpatient provider due to acute kidney injury., Disp: , Rfl:    torsemide (DEMADEX) 20 MG tablet, Hold until followup with outpatient provider due to acute kidney injury., Disp: , Rfl:    triamcinolone (NASACORT) 55 MCG/ACT AERO nasal inhaler, Place 2 sprays into the nose daily as needed., Disp: , Rfl:    UBRELVY 100 MG TABS, Take 100 mg by mouth daily as needed (migraines)., Disp: , Rfl:    Wheat Dextrin (EQ FIBER POWDER PO), Take 1 Dose by mouth as needed., Disp: , Rfl:   Physical exam: There were no vitals filed for this visit. Physical Exam Cardiovascular:     Rate and Rhythm: Normal rate and regular rhythm.     Heart sounds: Normal heart sounds.  Pulmonary:     Effort: Pulmonary effort is normal.     Breath sounds: Normal breath sounds.  Abdominal:     General: Bowel sounds are normal.     Palpations: Abdomen is soft.  Musculoskeletal:     Comments: Trace bilateral edema  Skin:    General: Skin is warm and dry.  Neurological:     Mental Status: She is alert and oriented to person, place, and time.         Latest Ref Rng & Units 08/29/2022    8:32 AM  CMP  Glucose 70 - 99 mg/dL 811   BUN 8 - 23 mg/dL 22   Creatinine 9.14 - 1.00 mg/dL 7.82  Sodium 135 - 145 mmol/L 136   Potassium 3.5 - 5.1 mmol/L 4.0   Chloride 98 - 111 mmol/L 98   CO2 22 - 32 mmol/L 29   Calcium 8.9 - 10.3 mg/dL 9.3   Total Protein 6.5 - 8.1 g/dL 7.1   Total Bilirubin 0.3 - 1.2 mg/dL 0.3   Alkaline Phos 38 - 126 U/L 93   AST 15 - 41 U/L 21   ALT 0 - 44 U/L 10       Latest Ref Rng & Units  08/29/2022    8:32 AM  CBC  WBC 4.0 - 10.5 K/uL 5.7   Hemoglobin 12.0 - 15.0 g/dL 82.9   Hematocrit 56.2 - 46.0 % 31.9   Platelets 150 - 400 K/uL 354     No images are attached to the encounter.  DG Chest 2 View  Result Date: 08/18/2022 CLINICAL DATA:  Right-sided chest pain or shortness of breath EXAM: CHEST - 2 VIEW COMPARISON:  08/07/2022 FINDINGS: Stable cardiomediastinal silhouette. Aortic atherosclerotic calcification. Right chest wall Port-A-Cath with tip in the mid SVC. Persistent right-greater-than-left perihilar and basilar atelectasis/scarring. Small right pleural effusion. Right apical pleural-parenchymal scarring. No pneumothorax. No displaced rib fractures. Cervical spine fusion hardware. IMPRESSION: No change from 08/07/2022. Perihilar and basilar atelectasis/scarring greater on the right. Small right pleural effusion. Electronically Signed   By: Minerva Fester M.D.   On: 08/18/2022 20:15   ECHOCARDIOGRAM COMPLETE  Result Date: 08/08/2022    ECHOCARDIOGRAM LIMITED REPORT   Patient Name:   AREONA FRIERSON Date of Exam: 08/08/2022 Medical Rec #:  [id number]      Height:       61.0 in Accession #:    [id number]     Weight:       159.0 lb Date of Birth:  [birth year]       BSA:          1.713 m Patient Age:    77 years       BP:           101/50 mmHg Patient Gender: F              HR:           83 bpm. Exam Location:  [facility] Procedure: 2D Echo, Color Doppler and Cardiac Doppler Indications:     I31.3 Pericardial Effusion  History:         Patient has prior history of Echocardiogram examinations.                  Murmur; COPD and Lug CA.  Sonographer:     L. Thornton-Maynard Referring Phys:  [id number] Verdene Lennert Diagnosing Phys: Alwyn Pea MD  Sonographer Comments: Image acquisition challenging due to COPD. IMPRESSIONS  1. Mild/Mod pericardial effusion . No evidence of tamponade.  2. Left ventricular ejection fraction, by estimation, is 70 to 75%. Left ventricular ejection fraction by PLAX  is 73 %. The left ventricle has hyperdynamic function. The left ventricle has no regional wall motion abnormalities. Left ventricular diastolic parameters are consistent with Grade I diastolic dysfunction (impaired relaxation).  3. Right ventricular systolic function is normal. The right ventricular size is normal.  4. Moderate pericardial effusion.  5. The mitral valve is normal in structure. No evidence of mitral valve regurgitation. FINDINGS  Left Ventricle: Left ventricular ejection fraction, by estimation, is 70 to 75%. Left ventricular ejection fraction by PLAX is 73 %. The left ventricle has hyperdynamic function. The left ventricle  has no regional wall motion abnormalities. The left ventricular internal cavity size was normal in size. There is no left ventricular hypertrophy. Left ventricular diastolic parameters are consistent with Grade I diastolic dysfunction (impaired relaxation). Right Ventricle: The right ventricular size is normal. No increase in right ventricular wall thickness. Right ventricular systolic function is normal. Left Atrium: Left atrial size was normal in size. Right Atrium: Right atrial size was normal in size. Pericardium: A moderately sized pericardial effusion is present. Mitral Valve: The mitral valve is normal in structure. Tricuspid Valve: The tricuspid valve is normal in structure. Tricuspid valve regurgitation is trivial. Aortic Valve: Aortic valve mean gradient measures 2.0 mmHg. Aortic valve peak gradient measures 3.8 mmHg. Aortic valve area, by VTI measures 2.98 cm. Pulmonic Valve: The pulmonic valve was normal in structure. Pulmonic valve regurgitation is not visualized. Aorta: The ascending aorta was not well visualized. Additional Comments: Mild/Mod pericardial effusion . No evidence of tamponade.  LEFT VENTRICLE PLAX 2D LV EF:         Left            Diastology                ventricular     LV e' medial:    5.55 cm/s                ejection        LV E/e' medial:  14.8                 fraction by     LV e' lateral:   7.62 cm/s                PLAX is 73      LV E/e' lateral: 10.8                %. LVIDd:         3.90 cm LVIDs:         2.30 cm LV PW:         1.00 cm LV IVS:        1.10 cm LVOT diam:     2.00 cm LV SV:         45 LV SV Index:   26 LVOT Area:     3.14 cm  LV Volumes (MOD) LV vol d, MOD    28.7 ml A2C: LV vol d, MOD    34.7 ml A4C: LV vol s, MOD    14.0 ml A2C: LV vol s, MOD    14.2 ml A4C: LV SV MOD A2C:   14.7 ml LV SV MOD A4C:   34.7 ml LV SV MOD BP:    17.5 ml RIGHT VENTRICLE RV S prime:     8.27 cm/s TAPSE (M-mode): 2.6 cm LEFT ATRIUM           Index        RIGHT ATRIUM           Index LA diam:      3.50 cm 2.04 cm/m   RA Area:     10.70 cm LA Vol (A4C): 24.9 ml 14.53 ml/m  RA Volume:   20.00 ml  11.67 ml/m  AORTIC VALVE                    PULMONIC VALVE AV Area (Vmax):    2.86 cm     PV Vmax:          0.57 m/s  AV Area (Vmean):   2.72 cm     PV Peak grad:     1.3 mmHg AV Area (VTI):     2.98 cm     PR End Diast Vel: 2.26 msec AV Vmax:           97.20 cm/s AV Vmean:          66.000 cm/s AV VTI:            0.152 m AV Peak Grad:      3.8 mmHg AV Mean Grad:      2.0 mmHg LVOT Vmax:         88.60 cm/s LVOT Vmean:        57.100 cm/s LVOT VTI:          0.144 m LVOT/AV VTI ratio: 0.95  AORTA Ao Root diam: 3.20 cm Ao Asc diam:  3.60 cm MITRAL VALVE MV Area (PHT): 4.60 cm    SHUNTS MV Decel Time: 165 msec    Systemic VTI:  0.14 m MV E velocity: 82.30 cm/s  Systemic Diam: 2.00 cm MV A velocity: 94.30 cm/s MV E/A ratio:  0.87 Dwayne D Callwood MD Electronically signed by Alwyn Pea MD Signature Date/Time: 08/08/2022/11:46:15 AM    Final    US Venous Img Lower Unilateral Left (DVT)  Result Date: 08/07/2022 CLINICAL DATA:  Left lower extremity edema. EXAM: LEFT LOWER EXTREMITY VENOUS DOPPLER ULTRASOUND TECHNIQUE: Gray-scale sonography with compression, as well as color and duplex ultrasound, were performed to evaluate the deep venous system(s) from the level of  the common femoral vein through the popliteal and proximal calf veins. COMPARISON:  None Available. FINDINGS: VENOUS Normal compressibility of the common femoral, superficial femoral, and popliteal veins, as well as the visualized calf veins. Visualized portions of profunda femoral vein and great saphenous vein unremarkable. No filling defects to suggest DVT on grayscale or color Doppler imaging. Doppler waveforms show normal direction of venous flow, normal respiratory plasticity and response to augmentation. Limited views of the contralateral common femoral vein are unremarkable. OTHER Fluid collection in the left popliteal fossa measures 2.5 by 0.9 x 2.6 cm. Limitations: None IMPRESSION: 1. No evidence for left lower extremity DVT. 2. Small left Baker's cyst. Electronically Signed   By: Signa Kell M.D.   On: 08/07/2022 18:01   CT Angio Chest PE W and/or Wo Contrast  Result Date: 08/07/2022 CLINICAL DATA:  PE suspected lung cancer * Tracking Code: BO * EXAM: CT ANGIOGRAPHY CHEST WITH CONTRAST TECHNIQUE: Multidetector CT imaging of the chest was performed using the standard protocol during bolus administration of intravenous contrast. Multiplanar CT image reconstructions and MIPs were obtained to evaluate the vascular anatomy. RADIATION DOSE REDUCTION: This exam was performed according to the departmental dose-optimization program which includes automated exposure control, adjustment of the mA and/or kV according to patient size and/or use of iterative reconstruction technique. CONTRAST:  75mL OMNIPAQUE IOHEXOL 350 MG/ML SOLN COMPARISON:  PET-CT, 05/30/2022 FINDINGS: Cardiovascular: Satisfactory opacification of the pulmonary arteries to the segmental level. No evidence of pulmonary embolism. Cardiomegaly. Left and right coronary artery calcifications. Moderate pericardial effusion, increased compared to prior examination. Right chest port catheter. Aortic atherosclerosis Mediastinum/Nodes: Unchanged matted  soft tissue throughout the mediastinum and hila without discretely enlarged lymph nodes. Thyroid gland, trachea, and esophagus demonstrate no significant findings. Lungs/Pleura: Small right pleural effusion and associated atelectasis or consolidation, improved compared to prior examination. Similar post treatment appearance of the chest, with dense paramedian perihilar consolidation fibrosis of  the lungs, with some interval evolution of radiation fibrosis. Moderate underlying emphysema. Upper Abdomen: No acute abnormality. Musculoskeletal: No chest wall abnormality. No acute osseous findings. Review of the MIP images confirms the above findings. IMPRESSION: 1. Negative examination for pulmonary embolism. 2. Similar post treatment appearance of the chest, with dense paramedian perihilar consolidation fibrosis of the lungs, with some interval evolution of radiation fibrosis. 3. Small right pleural effusion and associated atelectasis or consolidation, improved compared to prior examination. 4. Unchanged matted soft tissue throughout the mediastinum and hila without discretely enlarged lymph nodes. 5. Cardiomegaly and coronary artery disease. 6. Moderate pericardial effusion, increased compared to prior examination. Aortic Atherosclerosis (ICD10-I70.0). Electronically Signed   By: Jearld Lesch M.D.   On: 08/07/2022 14:58   DG Chest Port 1 View  Result Date: 08/07/2022 CLINICAL DATA:  [id number] SOB (shortness of breath) [id number] EXAM: PORTABLE CHEST - 1 VIEW COMPARISON:  07/18/2022 FINDINGS: Persistent moderate right pleural effusion with adjacent opacities at the right lung base. Coarse non anatomic opacities at the right hilum slightly more conspicuous. Right IJ port catheter stable, tip distal SVC. Coarse left perihilar interstitial opacities. Heart size and mediastinal contours are within normal limits. Aortic Atherosclerosis (ICD10-170.0). No pneumothorax. Cervical fixation hardware partially visualized. IMPRESSION:  Persistent moderate right pleural effusion with adjacent right lower lung atelectasis/infiltrate. Electronically Signed   By: Corlis Leak M.D.   On: 08/07/2022 12:25     Assessment and plan- Patient is a 77 y.o. female with history of recurrent locally advanced non-small cell lung cancer adenocarcinoma.  She is here for on treatment assessment prior to cycle 4 of maintenance Alimta  Patient was recently admitted to the hospital for supraventricular tachycardia requiring medical cardioversion.  She was found to have mild to moderate pericardial effusion and cardiology was consulted.  Pericardial fluid was not required to be tapped.  In the past patient has had at least 3-4 episodes of pleural effusion which has been tapped and so far have been negative for malignancy.  Therefore we do not have any convincing evidence to say that her pleural or pericardial fluid is malignant in nature.She had a CT angio chest for PE rule out in the hospital which did not show any evidence of pulmonary embolism and similar posttreatment changes in the lungs secondary to radiation.  Unchanged soft tissue in the mediastinum and hila without discretely enlarged lymph nodes.  Based on that CT scan we do not have any convincing evidence of progression and my plan is to continue with single agent Alimta at this time until progression or toxicity.  I will consider getting a PET scan sometime in August 2024  Counts okay to proceed with cycle 4 of maintenance Alimta today.  I will see her back in 3 weeks for cycle 5.    Chemo-induced anemia: Hemoglobin dropped down to 8.4 during hospitalization and has not presently improved to 10.  Continue to monitor   Visit Diagnosis 1. Encounter for antineoplastic chemotherapy   2. Recurrent non-small cell lung cancer (HCC)      Dr. Owens Shark, MD, MPH [facility] at [facility] [phone] 08/29/2022 9:31 AM

## 2022-08-29 NOTE — Patient Instructions (Signed)
[place] [facility]  Discharge Instructions: Thank you for choosing [facility] to provide your oncology and hematology care.  If you have a lab appointment with the Cancer Center, please go directly to the Cancer Center and check in at the registration area.  Wear comfortable clothing and clothing appropriate for easy access to any Portacath or PICC line.   We strive to give you quality time with your provider. You may need to reschedule your appointment if you arrive late (15 or more minutes).  Arriving late affects you and other patients whose appointments are after yours.  Also, if you miss three or more appointments without notifying the office, you may be dismissed from the clinic at the provider's discretion.      For prescription refill requests, have your pharmacy contact our office and allow 72 hours for refills to be completed.    Today you received the following chemotherapy and/or immunotherapy agents Pemetrexed      To help prevent nausea and vomiting after your treatment, we encourage you to take your nausea medication as directed.  BELOW ARE SYMPTOMS THAT SHOULD BE REPORTED IMMEDIATELY: *FEVER GREATER THAN 100.4 F (38 C) OR HIGHER *CHILLS OR SWEATING *NAUSEA AND VOMITING THAT IS NOT CONTROLLED WITH YOUR NAUSEA MEDICATION *UNUSUAL SHORTNESS OF BREATH *UNUSUAL BRUISING OR BLEEDING *URINARY PROBLEMS (pain or burning when urinating, or frequent urination) *BOWEL PROBLEMS (unusual diarrhea, constipation, pain near the anus) TENDERNESS IN MOUTH AND THROAT WITH OR WITHOUT PRESENCE OF ULCERS (sore throat, sores in mouth, or a toothache) UNUSUAL RASH, SWELLING OR PAIN  UNUSUAL VAGINAL DISCHARGE OR ITCHING   Items with * indicate a potential emergency and should be followed up as soon as possible or go to the Emergency Department if any problems should occur.  Please show the CHEMOTHERAPY ALERT CARD or IMMUNOTHERAPY ALERT CARD at check-in to  the Emergency Department and triage nurse.  Should you have questions after your visit or need to cancel or reschedule your appointment, please contact [place] [facility]  [phone] and follow the prompts.  Office hours are 8:00 a.m. to 4:30 p.m. Monday - Friday. Please note that voicemails left after 4:00 p.m. may not be returned until the following business day.  We are closed weekends and major holidays. You have access to a nurse at all times for urgent questions. Please call the main number to the clinic [phone] and follow the prompts.  For any non-urgent questions, you may also contact your provider using MyChart. We now offer e-Visits for anyone 58 and older to request care online for non-urgent symptoms. For details visit [email or web address].   Also download the MyChart app! Go to the app store, search "MyChart", open the app, select [place], and log in with your MyChart username and password.

## 2022-08-30 ENCOUNTER — Other Ambulatory Visit: Payer: Self-pay

## 2022-09-01 ENCOUNTER — Other Ambulatory Visit: Payer: Self-pay

## 2022-09-02 ENCOUNTER — Ambulatory Visit: Payer: Medicare Other

## 2022-09-07 ENCOUNTER — Encounter: Payer: Self-pay | Admitting: Radiation Oncology

## 2022-09-07 ENCOUNTER — Ambulatory Visit
Admission: RE | Admit: 2022-09-07 | Discharge: 2022-09-07 | Disposition: A | Discharge status: Home / Self Care | Payer: Medicare Other | Source: Ambulatory Visit | Attending: Radiation Oncology | Admitting: Radiation Oncology

## 2022-09-07 VITALS — BP 122/72 | HR 99 | Temp 97.4°F | Resp 16 | Wt 157.0 lb

## 2022-09-07 DIAGNOSIS — Z85118 Personal history of other malignant neoplasm of bronchus and lung: Secondary | ICD-10-CM | POA: Diagnosis present

## 2022-09-07 DIAGNOSIS — Z923 Personal history of irradiation: Secondary | ICD-10-CM | POA: Diagnosis not present

## 2022-09-07 DIAGNOSIS — C3412 Malignant neoplasm of upper lobe, left bronchus or lung: Secondary | ICD-10-CM

## 2022-09-07 NOTE — Progress Notes (Signed)
Radiation Oncology Follow up Note  Name: Jennifer Hodges   Date:   09/07/2022 MRN:  [id number] DOB: 1946/01/30    This 77 y.o. female presents to the clinic today for 3 and half year follow-up status post concurrent chemoradiation therapy for stage III right hilar adenocarcinoma.  REFERRING PROVIDER: Marina Goodell, MD  HPI: Patient is a 77 year old female now out over 3 and half years having completed concurrent chemoradiation therapy for stage III adenocarcinoma of the right lung.  She had previously been treated with concurrent chemoradiation therapy for adenocarcinoma the left lung..  Seen today in routine follow-up she is doing well specifically [place] cough hemoptysis chest tightness or any change in her pulmonary status.  She had a CT scan in May which I reviewed showing posttreatment appearance of the chest with dense paramedial perihilar consolidation fibrosis consistent with radiation changes.  No evidence of progression disease or recurrent disease noted.  COMPLICATIONS OF TREATMENT: none  FOLLOW UP COMPLIANCE: keeps appointments   PHYSICAL EXAM:  BP 122/72   Pulse 99   Temp (!) 97.4 F (36.3 C) (Tympanic)   Resp 16   Wt 157 lb (71.2 kg)   BMI 29.66 kg/m  Well-developed well-nourished patient in NAD. HEENT reveals PERLA, EOMI, discs not visualized.  Oral cavity is clear. No oral mucosal lesions are identified. Neck is clear without evidence of cervical or supraclavicular adenopathy. Lungs are clear to A&P. Cardiac examination is essentially unremarkable with regular rate and rhythm without murmur rub or thrill. Abdomen is benign with no organomegaly or masses noted. Motor sensory and DTR levels are equal and symmetric in the upper and lower extremities. Cranial nerves II through XII are grossly intact. Proprioception is intact. No peripheral adenopathy or edema is identified. No motor or sensory levels are noted. Crude visual fields are within normal range.  RADIOLOGY RESULTS:  CT scans reviewed compatible with above-stated findings  PLAN: Present time patient is doing well now out over 3 and half years with no evidence of disease.  I will turn follow-up care over to medical oncology.  I would be happy to reevaluate patient anytime should that be indicated.  I would like to take this opportunity to thank you for allowing me to participate in the care of your patient.Carmina Miller, MD

## 2022-09-12 ENCOUNTER — Ambulatory Visit: Payer: Medicare Other

## 2022-09-12 ENCOUNTER — Ambulatory Visit: Payer: Medicare Other | Admitting: Internal Medicine

## 2022-09-12 ENCOUNTER — Other Ambulatory Visit: Payer: Medicare Other

## 2022-09-14 ENCOUNTER — Other Ambulatory Visit: Payer: Medicare Other

## 2022-09-15 ENCOUNTER — Other Ambulatory Visit: Payer: Self-pay

## 2022-09-19 ENCOUNTER — Other Ambulatory Visit: Payer: Self-pay | Admitting: *Deleted

## 2022-09-19 ENCOUNTER — Encounter: Payer: Self-pay | Admitting: Oncology

## 2022-09-19 ENCOUNTER — Ambulatory Visit
Admission: RE | Admit: 2022-09-19 | Discharge: 2022-09-19 | Disposition: A | Discharge status: Home / Self Care | Payer: Medicare Other | Source: Ambulatory Visit | Attending: Oncology | Admitting: Oncology

## 2022-09-19 ENCOUNTER — Inpatient Hospital Stay (HOSPITAL_BASED_OUTPATIENT_CLINIC_OR_DEPARTMENT_OTHER): Payer: Medicare Other | Admitting: Oncology

## 2022-09-19 ENCOUNTER — Inpatient Hospital Stay: Payer: Medicare Other | Attending: Oncology

## 2022-09-19 ENCOUNTER — Inpatient Hospital Stay: Payer: Medicare Other

## 2022-09-19 VITALS — BP 122/69 | HR 107 | Temp 97.8°F | Ht 61.0 in | Wt 158.3 lb

## 2022-09-19 VITALS — BP 117/66 | HR 88 | Resp 18

## 2022-09-19 DIAGNOSIS — C3412 Malignant neoplasm of upper lobe, left bronchus or lung: Secondary | ICD-10-CM

## 2022-09-19 DIAGNOSIS — T451X5A Adverse effect of antineoplastic and immunosuppressive drugs, initial encounter: Secondary | ICD-10-CM

## 2022-09-19 DIAGNOSIS — D6481 Anemia due to antineoplastic chemotherapy: Secondary | ICD-10-CM | POA: Insufficient documentation

## 2022-09-19 DIAGNOSIS — J90 Pleural effusion, not elsewhere classified: Secondary | ICD-10-CM | POA: Insufficient documentation

## 2022-09-19 DIAGNOSIS — R0602 Shortness of breath: Secondary | ICD-10-CM

## 2022-09-19 DIAGNOSIS — Z5111 Encounter for antineoplastic chemotherapy: Secondary | ICD-10-CM

## 2022-09-19 LAB — CBC WITH DIFFERENTIAL/PLATELET
Abs Immature Granulocytes: 0.01 10*3/uL (ref 0.00–0.07)
Basophils Absolute: 0 10*3/uL (ref 0.0–0.1)
Basophils Relative: 1 %
Eosinophils Absolute: 0.1 10*3/uL (ref 0.0–0.5)
Eosinophils Relative: 1 %
HCT: 30.6 % — ABNORMAL LOW (ref 36.0–46.0)
Hemoglobin: 10 g/dL — ABNORMAL LOW (ref 12.0–15.0)
Immature Granulocytes: 0 %
Lymphocytes Relative: 11 %
Lymphs Abs: 0.6 10*3/uL — ABNORMAL LOW (ref 0.7–4.0)
MCH: 30.8 pg (ref 26.0–34.0)
MCHC: 32.7 g/dL (ref 30.0–36.0)
MCV: 94.2 fL (ref 80.0–100.0)
Monocytes Absolute: 0.7 10*3/uL (ref 0.1–1.0)
Monocytes Relative: 13 %
Neutro Abs: 3.8 10*3/uL (ref 1.7–7.7)
Neutrophils Relative %: 74 %
Platelets: 495 10*3/uL — ABNORMAL HIGH (ref 150–400)
RBC: 3.25 MIL/uL — ABNORMAL LOW (ref 3.87–5.11)
RDW: 16.1 % — ABNORMAL HIGH (ref 11.5–15.5)
WBC: 5.2 10*3/uL (ref 4.0–10.5)
nRBC: 0 % (ref 0.0–0.2)

## 2022-09-19 LAB — COMPREHENSIVE METABOLIC PANEL
ALT: 15 U/L (ref 0–44)
AST: 21 U/L (ref 15–41)
Albumin: 3.5 g/dL (ref 3.5–5.0)
Alkaline Phosphatase: 82 U/L (ref 38–126)
Anion gap: 7 (ref 5–15)
BUN: 18 mg/dL (ref 8–23)
CO2: 29 mmol/L (ref 22–32)
Calcium: 9 mg/dL (ref 8.9–10.3)
Chloride: 97 mmol/L — ABNORMAL LOW (ref 98–111)
Creatinine, Ser: 0.64 mg/dL (ref 0.44–1.00)
GFR, Estimated: >60 mL/min (ref >60)
Glucose, Bld: 87 mg/dL (ref 70–99)
Potassium: 4 mmol/L (ref 3.5–5.1)
Sodium: 133 mmol/L — ABNORMAL LOW (ref 135–145)
Total Bilirubin: 0.4 mg/dL (ref 0.3–1.2)
Total Protein: 7.1 g/dL (ref 6.5–8.1)

## 2022-09-19 MED ORDER — SODIUM CHLORIDE 0.9 % IV SOLN
10.0000 mg | Freq: Once | INTRAVENOUS | Status: AC
  Administered 2022-09-19: 10 mg via INTRAVENOUS
  Filled 2022-09-19: qty 10

## 2022-09-19 MED ORDER — SODIUM CHLORIDE 0.9 % IV SOLN
500.0000 mg/m2 | Freq: Once | INTRAVENOUS | Status: AC
  Administered 2022-09-19: 900 mg via INTRAVENOUS
  Filled 2022-09-19: qty 20

## 2022-09-19 MED ORDER — SODIUM CHLORIDE 0.9 % IV SOLN
Freq: Once | INTRAVENOUS | Status: AC
  Filled 2022-09-19: qty 250

## 2022-09-19 MED ORDER — CYANOCOBALAMIN 1000 MCG/ML IJ SOLN
1000.0000 ug | Freq: Once | INTRAMUSCULAR | Status: AC
  Administered 2022-09-19: 1000 ug via INTRAMUSCULAR
  Filled 2022-09-19: qty 1

## 2022-09-19 MED ORDER — HEPARIN SOD (PORK) LOCK FLUSH 100 UNIT/ML IV SOLN
500.0000 [IU] | Freq: Once | INTRAVENOUS | Status: AC | PRN
  Administered 2022-09-19: 500 [IU]
  Filled 2022-09-19: qty 5

## 2022-09-19 NOTE — Progress Notes (Signed)
Hematology/Oncology Consult note [facility]  Telephone:([phone] [phone]  Patient Care Team: Marina Goodell, MD as PCP - General (Family Medicine) Glory Buff, RN as Registered Nurse Carmina Miller, MD as Referring Physician (Radiation Oncology) Creig Hines, MD as Consulting Physician (Oncology)   Name of the patient: Jennifer Hodges  [id number]  10-Oct-1945   Date of visit: 09/19/22  Diagnosis- locally recurrent adenocarcinoma of the lung stage III   Chief complaint/ Reason for visit-on treatment assessment prior to next cycle of Alimta  Heme/Onc history: Patient is a 77 year old female diagnosed with T1b N2 M0 stage III adenocarcinoma of the lung in September 2020.  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  EGFR and Ros as well as MET testing negative. PD-L1 was 50%   Patient completed concurrent chemoradiation with weekly carbotaxol chemotherapy on 03/12/2019.  Maintenance durvalumab started in January 2021 after scan showed partial response.  Patient completed 1 year of maintenance durvalumab in February 2022   Patient was found to have enlarging mediastinal adenopathyOn subsequent CT scan in January 2023.  This was followed by a PET scan which showed hypermetabolic mediastinal and right hilar lymph nodes but no other evidence of disease elsewhere.  This was biopsied and was consistent with non-small cell lung cancer specifically adenocarcinoma.  Cells were positive for TTF-1 and Napsin A and negative for p40.   Patient completed concurrent chemoradiation with 4 cycles of carbo Alimta ending in May 2023.  Scan showed partial response.  Plan is for maintenance Alimta until progression or toxicity.  Alimta was stopped in November 2023 due to progressive anemia and stable scans   Patient was hospitalized in November  2023 for acute on chronic hypoxic respiratory failure secondary to healthcare associated pneumonia.  She had pleural effusions which were tapped and thoracentesis was negative for malignancy.   Scans in March 2024 showed concern for progressive disease with appearance of new right lung nodules as well as progressive mediastinal adenopathy.  Also noted to have right pleural effusion which was tapped and was negative for malignancy.  Single agent Alimta was restarted  Interval history-she feels that her shortness of breath has gotten worse over the last 1 week or so.  She did not bring her home oxygen with her today.  She uses it as needed.  She has chronic fatigue which has remained unchanged  ECOG PS- 2 Pain scale- 0   Review of systems- Review of Systems  Constitutional:  Positive for malaise/fatigue. Negative for chills, fever and weight loss.  HENT:  Negative for congestion, ear discharge and nosebleeds.   Eyes:  Negative for blurred vision.  Respiratory:  Positive for shortness of breath. Negative for cough, hemoptysis, sputum production and wheezing.   Cardiovascular:  Negative for chest pain, palpitations, orthopnea and claudication.  Gastrointestinal:  Negative for abdominal pain, blood in stool, constipation, diarrhea, heartburn, melena, nausea and vomiting.  Genitourinary:  Negative for dysuria, flank pain, frequency, hematuria and urgency.  Musculoskeletal:  Negative for back pain, joint pain and myalgias.  Skin:  Negative for rash.  Neurological:  Negative for dizziness, tingling, focal weakness, seizures, weakness and headaches.  Endo/Heme/Allergies:  Does not bruise/bleed easily.  Psychiatric/Behavioral:  Negative for depression and suicidal ideas. The patient does not have insomnia.       Allergies  Allergen Reactions   Aspirin Hives, Shortness Of Breath  and Other (See Comments)    Difficulty breathing   Celebrex [Celecoxib] Shortness Of Breath   Morphine And Codeine  Nausea And Vomiting and Swelling   Adhesive [Tape] Other (See Comments)    Took top layer of skin off when removed.   Clarithromycin Nausea And Vomiting   Codeine Nausea And Vomiting   Darvon [Propoxyphene Hcl] Nausea And Vomiting   Demerol [Meperidine] Nausea And Vomiting   Flonase [Fluticasone Propionate] Other (See Comments)    Fungal infection in nose   Simvastatin Other (See Comments)    "caused ulcers in mouth, and fever"   Talwin [Pentazocine] Nausea And Vomiting     Past Medical History:  Diagnosis Date   Anemia    Asthma    No Inhalers--Dr. Meredeth Ide will order as needed   Bronchiectasis (HCC)    mild   Chronic headaches     followed by [facility] migraines   COPD (chronic obstructive pulmonary disease) (HCC)    DDD (degenerative disc disease), lumbar    Diverticulosis    Family history of adverse reaction to anesthesia    mother and sisters-PONV   Gall stones    history of   GERD (gastroesophageal reflux disease)    EGD 8/09- non bleeding erosive gastritis, documentd esophageal ulcerations.    Hiatal hernia    small   History of kidney stones    History of pneumonia    Hypercholesterolemia    IBS (irritable bowel syndrome)    Malignant neoplasm of upper lobe of left lung (HCC) 12/27/2018   Meniere disease    Murmur    Osteoarthritis    lumbar disc disease, left hip   Personal history of chemotherapy    Personal history of radiation therapy    Pneumonia 12/2020   PONV (postoperative nausea and vomiting)    Sleep apnea    uses cpap   Vertigo    Weakness of right side of body      Past Surgical History:  Procedure Laterality Date   ABDOMINAL HYSTERECTOMY  age 39   ANTERIOR CERVICAL DECOMP/DISCECTOMY FUSION N/A 02/24/2015   Procedure: CERVICAL FOUR-FIVE, CERVICAL FIVE-SIX, CERVICAL SIX-SEVEN ANTERIOR CERVICAL DECOMPRESSION/DISCECTOMY FUSION ;  Surgeon: Lisbeth Renshaw, MD;  Location: [facility];  Service: Neurosurgery;  Laterality: N/A;  C45 C56  C67 anterior cervical decompression with fusion interbody prosthesis plating and bonegraft   APPENDECTOMY     BACK SURGERY  1988   4th lumbar fusion   BLADDER SURGERY N/A    with vaginal wall repair   BREAST CYST ASPIRATION Bilateral    neg   BREAST SURGERY Bilateral    cyst removed and reduction   CARDIAC CATHETERIZATION  2014   CARPAL TUNNEL RELEASE Right 02/11/2016   Procedure: CARPAL TUNNEL RELEASE;  Surgeon: Kennedy Bucker, MD;  Location: [facility] ORS;  Service: Orthopedics;  Laterality: Right;   CATARACT EXTRACTION W/ INTRAOCULAR LENS IMPLANT Bilateral 2015   CHOLECYSTECTOMY     EUS N/A 05/31/2012   Procedure: UPPER ENDOSCOPIC ULTRASOUND (EUS) LINEAR;  Surgeon: Rachael Fee, MD;  Location: [facility];  Service: Endoscopy;  Laterality: N/A;   EXCISIONAL HEMORRHOIDECTOMY     JOINT REPLACEMENT Bilateral    KNEE ARTHROSCOPY WITH LATERAL MENISECTOMY Right 07/07/2015   Procedure: KNEE ARTHROSCOPY WITH LATERAL MENISECTOMY, PARTIAL SYNOVECTOMY;  Surgeon: Kennedy Bucker, MD;  Location: [facility] ORS;  Service: Orthopedics;  Laterality: Right;   LUMBAR LAMINECTOMY     PORTA CATH INSERTION N/A 01/07/2019   Procedure: PORTA CATH INSERTION;  Surgeon:  Annice Needy, MD;  Location: [facility] INVAS[facility];  Service: Cardiovascular;  Laterality: N/A;   REDUCTION MAMMAPLASTY  1990   RIGHT OOPHORECTOMY     TOTAL HIP ARTHROPLASTY Left 05/01/2014   Dr. Sheppard Evens   TOTAL HIP ARTHROPLASTY Right 08/05/2014   Procedure: TOTAL HIP ARTHROPLASTY ANTERIOR APPROACH;  Surgeon: Kennedy Bucker, MD;  Location: [facility] ORS;  Service: Orthopedics;  Laterality: Right;   ULNAR NERVE TRANSPOSITION Right 02/11/2016   Procedure: ULNAR NERVE DECOMPRESSION/TRANSPOSITION;  Surgeon: Kennedy Bucker, MD;  Location: [facility] ORS;  Service: Orthopedics;  Laterality: Right;   VIDEO BRONCHOSCOPY WITH ENDOBRONCHIAL ULTRASOUND Left 12/19/2018   Procedure: VIDEO BRONCHOSCOPY WITH ENDOBRONCHIAL ULTRASOUND, LEFT, SLEEP APNEA;  Surgeon: Vida Rigger, MD;  Location: [facility] ORS;  Service: Thoracic;  Laterality: Left;   VIDEO BRONCHOSCOPY WITH ENDOBRONCHIAL ULTRASOUND Right 05/14/2021   Procedure: VIDEO BRONCHOSCOPY WITH ENDOBRONCHIAL ULTRASOUND;  Surgeon: Salena Saner, MD;  Location: [facility] ORS;  Service: Pulmonary;  Laterality: Right;    Social History   Socioeconomic History   Marital status: Married    Spouse name: Not on file   Number of children: Not on file   Years of education: Not on file   Highest education level: Not on file  Occupational History   Not on file  Tobacco Use   Smoking status: Former    Packs/day: 1.00    Years: 40.00    Additional pack years: 0.00    Total pack years: 40.00    Types: Cigarettes    Quit date: 06/13/2007    Years since quitting: 15.2   Smokeless tobacco: Never  Vaping Use   Vaping Use: Never used  Substance and Sexual Activity   Alcohol use: No    Alcohol/week: 0.0 standard drinks of alcohol   Drug use: No   Sexual activity: Not on file  Other Topics Concern   Not on file  Social History Narrative   Not on file   Social Determinants of Health   Financial Resource Strain: Not on file  Food Insecurity: No Food Insecurity (08/08/2022)   Hunger Vital Sign    Worried About Running Out of Food in the Last Year: Never true    Ran Out of Food in the Last Year: Never true  Transportation Needs: No Transportation Needs (08/08/2022)   PRAPARE - Administrator, Civil Service (Medical): No    Lack of Transportation (Non-Medical): No  Physical Activity: Not on file  Stress: Not on file  Social Connections: Not on file  Intimate Partner Violence: Not At Risk (08/08/2022)   Humiliation, Afraid, Rape, and Kick questionnaire    Fear of Current or Ex-Partner: No    Emotionally Abused: No    Physically Abused: No    Sexually Abused: No    Family History  Problem Relation Age of Onset   Heart disease Mother        s/p stent   Hypertension Mother    Hypercholesterolemia  Mother    Diabetes Father    Stomach cancer Other        uncle   Breast cancer Cousin    Breast cancer Paternal Aunt      Current Outpatient Medications:    acetaminophen (TYLENOL) 500 MG tablet, Take 500 mg by mouth 2 (two) times daily., Disp: , Rfl:    Bioflavonoid Products (ESTER C PO), Take 1 tablet by mouth daily. 500 mg, Disp: , Rfl:    Calcium-Magnesium-Zinc (CAL-MAG-ZINC PO), Take 1 tablet by mouth  daily., Disp: , Rfl:    cyclobenzaprine (FLEXERIL) 10 MG tablet, Take 10 mg by mouth at bedtime., Disp: , Rfl:    ezetimibe (ZETIA) 10 MG tablet, Take 5 mg by mouth in the morning and at bedtime., Disp: , Rfl:    glycerin adult 2 g suppository, Place 1 suppository rectally as needed for constipation., Disp: , Rfl:    Histamine Dihydrochloride (AUSTRALIAN DREAM ARTHRITIS) 0.025 % CREA, Apply 1 application  topically 4 (four) times daily as needed (pain)., Disp: , Rfl:    HYDROcodone-acetaminophen (NORCO/VICODIN) 5-325 MG tablet, Take 1 tablet by mouth at bedtime., Disp: 30 tablet, Rfl: 0   ipratropium-albuterol (DUONEB) 0.5-2.5 (3) MG/3ML SOLN, Take 3 mLs by nebulization every 6 (six) hours as needed., Disp: 360 mL, Rfl: 0   metoprolol tartrate (LOPRESSOR) 25 MG tablet, Take 0.5 tablets (12.5 mg total) by mouth 2 (two) times daily., Disp: 30 tablet, Rfl: 2   ondansetron (ZOFRAN) 8 MG tablet, Take 1 tablet (8 mg total) by mouth every 8 (eight) hours as needed for nausea or vomiting., Disp: 20 tablet, Rfl: 0   pantoprazole (PROTONIX) 40 MG tablet, Take 40 mg by mouth 2 (two) times daily., Disp: , Rfl:    Polyethyl Glyc-Propyl Glyc PF (SYSTANE HYDRATION PF) 0.4-0.3 % SOLN, Place 1 drop into both eyes as needed. as, Disp: , Rfl:    polyethylene glycol powder (GLYCOLAX/MIRALAX) 17 GM/SCOOP powder, Take 17 g by mouth daily as needed for moderate constipation., Disp: , Rfl:    Sennosides (SENNA) 8.6 MG CAPS, Take 1 capsule by mouth daily as needed (constipation)., Disp: , Rfl:    spironolactone  (ALDACTONE) 50 MG tablet, Hold until followup with outpatient provider due to acute kidney injury., Disp: , Rfl:    torsemide (DEMADEX) 20 MG tablet, Hold until followup with outpatient provider due to acute kidney injury., Disp: , Rfl:    triamcinolone (NASACORT) 55 MCG/ACT AERO nasal inhaler, Place 2 sprays into the nose daily as needed., Disp: , Rfl:    UBRELVY 100 MG TABS, Take 100 mg by mouth daily as needed (migraines)., Disp: , Rfl:    Wheat Dextrin (EQ FIBER POWDER PO), Take 1 Dose by mouth as needed., Disp: , Rfl:    apixaban (ELIQUIS) 5 MG TABS tablet, Take 1 tablet (5 mg total) by mouth 2 (two) times daily., Disp: 60 tablet, Rfl: 0   loratadine (CLARITIN) 10 MG tablet, Take 10 mg by mouth daily. (Patient not taking: Reported on 09/19/2022), Disp: , Rfl:   Physical exam:  Vitals:   09/19/22 0850  BP: 122/69  Pulse: (!) 107  Temp: 97.8 F (36.6 C)  TempSrc: Tympanic  SpO2: 97%  Weight: 158 lb 4.8 oz (71.8 kg)  Height: 5\' 1"  (1.549 m)   Physical Exam Cardiovascular:     Rate and Rhythm: Normal rate and regular rhythm.     Heart sounds: Normal heart sounds.  Pulmonary:     Effort: Pulmonary effort is normal.     Comments: Breath sounds mildly decreased over right lung base Abdominal:     General: Bowel sounds are normal.     Palpations: Abdomen is soft.  Musculoskeletal:     Right lower leg: No edema.     Left lower leg: No edema.  Skin:    General: Skin is warm and dry.  Neurological:     Mental Status: She is alert and oriented to person, place, and time.         Latest Ref Rng &  Units 09/19/2022    8:52 AM  CMP  Glucose 70 - 99 mg/dL 87   BUN 8 - 23 mg/dL 18   Creatinine 1.61 - 1.00 mg/dL 0.96   Sodium 045 - 409 mmol/L 133   Potassium 3.5 - 5.1 mmol/L 4.0   Chloride 98 - 111 mmol/L 97   CO2 22 - 32 mmol/L 29   Calcium 8.9 - 10.3 mg/dL 9.0   Total Protein 6.5 - 8.1 g/dL 7.1   Total Bilirubin 0.3 - 1.2 mg/dL 0.4   Alkaline Phos 38 - 126 U/L 82   AST 15 - 41  U/L 21   ALT 0 - 44 U/L 15       Latest Ref Rng & Units 09/19/2022    8:52 AM  CBC  WBC 4.0 - 10.5 K/uL 5.2   Hemoglobin 12.0 - 15.0 g/dL 81.1   Hematocrit 91.4 - 46.0 % 30.6   Platelets 150 - 400 K/uL 495      Assessment and plan- Patient is a 77 y.o. female with history of recurrent locally advanced non-small cell lung cancer adenocarcinoma.  She is here for on treatment assessment prior to cycle 5 of maintenance Alimta  Counts okay to proceed with cycle 5 of maintenance Alimta today.  I will see her back in 3 weeks for cycle 6.  I will plan to get a PET scan prior to my visit to see how she is responding in terms of her lung cancer.  She had a CT angio chest for PE in May 2024 which did not show any evidence of progressive lung cancer or PE.  Patient does get pleural effusions from time to time which have been tapped in the past and have been negative for malignancy.  We will obtain a chest x-ray today to see if she needs another thoracentesis.  Chemo-induced anemia: Stable around 10 continue to monitor   Visit Diagnosis 1. Malignant neoplasm of upper lobe of left lung (HCC)   2. Encounter for antineoplastic chemotherapy   3. Antineoplastic chemotherapy induced anemia      Dr. Owens Shark, MD, MPH [facility] at [facility] [phone] 09/19/2022 2:16 PM

## 2022-09-19 NOTE — Progress Notes (Signed)
Pt states she stopped Claritin due to SOB.  C/o bottom of feet hurting.

## 2022-09-19 NOTE — Patient Instructions (Signed)

## 2022-09-19 NOTE — Patient Instructions (Signed)
[facility]  Discharge Instructions: Thank you for choosing [facility] to provide your oncology and hematology care.  If you have a lab appointment with the Cancer Center, please go directly to the Cancer Center and check in at the registration area.  Wear comfortable clothing and clothing appropriate for easy access to any Portacath or PICC line.   We strive to give you quality time with your provider. You may need to reschedule your appointment if you arrive late (15 or more minutes).  Arriving late affects you and other patients whose appointments are after yours.  Also, if you miss three or more appointments without notifying the office, you may be dismissed from the clinic at the provider's discretion.      For prescription refill requests, have your pharmacy contact our office and allow 72 hours for refills to be completed.    Today you received the following chemotherapy and/or immunotherapy agents Pemetrexed      To help prevent nausea and vomiting after your treatment, we encourage you to take your nausea medication as directed.  BELOW ARE SYMPTOMS THAT SHOULD BE REPORTED IMMEDIATELY: *FEVER GREATER THAN 100.4 F (38 C) OR HIGHER *CHILLS OR SWEATING *NAUSEA AND VOMITING THAT IS NOT CONTROLLED WITH YOUR NAUSEA MEDICATION *UNUSUAL SHORTNESS OF BREATH *UNUSUAL BRUISING OR BLEEDING *URINARY PROBLEMS (pain or burning when urinating, or frequent urination) *BOWEL PROBLEMS (unusual diarrhea, constipation, pain near the anus) TENDERNESS IN MOUTH AND THROAT WITH OR WITHOUT PRESENCE OF ULCERS (sore throat, sores in mouth, or a toothache) UNUSUAL RASH, SWELLING OR PAIN  UNUSUAL VAGINAL DISCHARGE OR ITCHING   Items with * indicate a potential emergency and should be followed up as soon as possible or go to the Emergency Department if any problems should occur.  Please show the CHEMOTHERAPY ALERT CARD or IMMUNOTHERAPY ALERT CARD at check-in to  the Emergency Department and triage nurse.  Should you have questions after your visit or need to cancel or reschedule your appointment, please contact [facility]  [phone] and follow the prompts.  Office hours are 8:00 a.m. to 4:30 p.m. Monday - Friday. Please note that voicemails left after 4:00 p.m. may not be returned until the following business day.  We are closed weekends and major holidays. You have access to a nurse at all times for urgent questions. Please call the main number to the clinic [phone] and follow the prompts.  For any non-urgent questions, you may also contact your provider using MyChart. We now offer e-Visits for anyone 18 and older to request care online for non-urgent symptoms. For details visit [email or web address].   Also download the MyChart app! Go to the app store, search "MyChart", open the app, select , and log in with your MyChart username and password.    

## 2022-09-20 ENCOUNTER — Other Ambulatory Visit: Payer: Self-pay

## 2022-09-22 ENCOUNTER — Other Ambulatory Visit: Payer: Self-pay

## 2022-09-22 ENCOUNTER — Encounter: Payer: Self-pay | Admitting: Emergency Medicine

## 2022-09-22 ENCOUNTER — Emergency Department
Admission: EM | Admit: 2022-09-22 | Discharge: 2022-09-22 | Disposition: A | Discharge status: Home / Self Care | Payer: Medicare Other | Source: Home / Self Care | Attending: Emergency Medicine | Admitting: Emergency Medicine

## 2022-09-22 ENCOUNTER — Emergency Department: Payer: Medicare Other

## 2022-09-22 DIAGNOSIS — R079 Chest pain, unspecified: Secondary | ICD-10-CM | POA: Diagnosis not present

## 2022-09-22 DIAGNOSIS — C3490 Malignant neoplasm of unspecified part of unspecified bronchus or lung: Secondary | ICD-10-CM | POA: Diagnosis not present

## 2022-09-22 LAB — BASIC METABOLIC PANEL
Anion gap: 9 (ref 5–15)
BUN: 19 mg/dL (ref 8–23)
CO2: 29 mmol/L (ref 22–32)
Calcium: 9.2 mg/dL (ref 8.9–10.3)
Chloride: 98 mmol/L (ref 98–111)
Creatinine, Ser: 0.62 mg/dL (ref 0.44–1.00)
GFR, Estimated: >60 mL/min (ref >60)
Glucose, Bld: 95 mg/dL (ref 70–99)
Potassium: 3.9 mmol/L (ref 3.5–5.1)
Sodium: 136 mmol/L (ref 135–145)

## 2022-09-22 LAB — CBC
HCT: 32.4 % — ABNORMAL LOW (ref 36.0–46.0)
Hemoglobin: 10.4 g/dL — ABNORMAL LOW (ref 12.0–15.0)
MCH: 30.5 pg (ref 26.0–34.0)
MCHC: 32.1 g/dL (ref 30.0–36.0)
MCV: 95 fL (ref 80.0–100.0)
Platelets: 463 10*3/uL — ABNORMAL HIGH (ref 150–400)
RBC: 3.41 MIL/uL — ABNORMAL LOW (ref 3.87–5.11)
RDW: 15.6 % — ABNORMAL HIGH (ref 11.5–15.5)
WBC: 6 10*3/uL (ref 4.0–10.5)
nRBC: 0 % (ref 0.0–0.2)

## 2022-09-22 LAB — PROTIME-INR
INR: 1.2 (ref 0.8–1.2)
Prothrombin Time: 15.7 seconds — ABNORMAL HIGH (ref 11.4–15.2)

## 2022-09-22 LAB — TROPONIN I (HIGH SENSITIVITY): Troponin I (High Sensitivity): 13 ng/L (ref <18)

## 2022-09-22 MED ORDER — HEPARIN SOD (PORK) LOCK FLUSH 100 UNIT/ML IV SOLN
500.0000 [IU] | Freq: Once | INTRAVENOUS | Status: AC
  Administered 2022-09-22: 500 [IU] via INTRAVENOUS
  Filled 2022-09-22: qty 5

## 2022-09-22 NOTE — ED Notes (Signed)
Phlebotomist contacted for lab draw.

## 2022-09-22 NOTE — ED Notes (Signed)
Called lab again to inquire about lab tech coming to draw pt's blood. Lab on the way to stick pt who is a hard stick.

## 2022-09-22 NOTE — ED Triage Notes (Signed)
Patient to ED via POV for centralized chest pain that started this AM. States her pulse was 199 at home. Currently getting chemo for lung cancer every 3 weeks- had it last week. Wears 3L of O2 at baseline.

## 2022-09-22 NOTE — ED Provider Notes (Signed)
[facility] Provider Note   Event Date/Time   First MD Initiated Contact with Patient 09/22/22 1523     (approximate) History  Chest Pain  HPI Jennifer Hodges is a 77 y.o. female with a past medical history of lung cancer currently on chemotherapy every 3 weeks with the last dose last week and on 3 L of O2 at baseline who presents complaining of chest pain that began this morning and has no exacerbating or relieving factors.  Patient also endorses palpitations.  Patient states that she has palpitations and chest pain after each chemotherapy infusion.  Patient was told that this chemotherapy may be "harmful to her heart". ROS: Patient currently denies any vision changes, tinnitus, difficulty speaking, facial droop, sore throat, abdominal pain, nausea/vomiting/diarrhea, dysuria, or weakness/numbness/paresthesias in any extremity   Physical Exam  Triage Vital Signs: ED Triage Vitals  Encounter Vitals Group     BP 09/22/22 1142 106/73     Systolic BP Percentile --      Diastolic BP Percentile --      Pulse Rate 09/22/22 1142 91     Resp 09/22/22 1142 18     Temp 09/22/22 1142 98.3 F (36.8 C)     Temp Source 09/22/22 1142 Oral     SpO2 09/22/22 1142 99 %     Weight --      Height --      Head Circumference --      Peak Flow --      Pain Score 09/22/22 1141 8     Pain Loc --      Pain Education --      Exclude from Growth Chart --    Most recent vital signs: Vitals:   09/22/22 1142 09/22/22 1600  BP: 106/73 111/67  Pulse: 91 90  Resp: 18 16  Temp: 98.3 F (36.8 C)   SpO2: 99% 100%   General: Awake, oriented x4. CV:  Good peripheral perfusion.  Resp:  Normal effort.  Abd:  No distention.  Other:  Elderly overweight Caucasian female laying in bed in no acute distress. ED Results / Procedures / Treatments  Labs (all labs ordered are listed, but only abnormal results are displayed) Labs Reviewed  CBC - Abnormal; Notable for the following  components:      Result Value   RBC 3.41 (*)    Hemoglobin 10.4 (*)    HCT 32.4 (*)    RDW 15.6 (*)    Platelets 463 (*)    All other components within normal limits  PROTIME-INR - Abnormal; Notable for the following components:   Prothrombin Time 15.7 (*)    All other components within normal limits  BASIC METABOLIC PANEL  TROPONIN I (HIGH SENSITIVITY)   EKG ED ECG REPORT I, Merwyn Katos, the attending physician, personally viewed and interpreted this ECG. Date: 09/22/2022 EKG Time: 1143 Rate: 89 Rhythm: normal sinus rhythm QRS Axis: normal Intervals: normal ST/T Wave abnormalities: normal Narrative Interpretation: no evidence of acute ischemia RADIOLOGY ED MD interpretation: 2 view chest x-ray interpreted independently by me and shows no new infiltrates or signs of pulmonary edema.  There is increased markings in both perihilar regions with opacification of the right lower lung field that have not changed from previous x-rays.  There is small to moderate right pleural effusion that is also unchanged -Agree with radiology assessment Official radiology report(s): DG Chest 2 View  Result Date: 09/22/2022 CLINICAL DATA:  Chest pain, undergoing chemotherapy for  lung carcinoma EXAM: CHEST - 2 VIEW COMPARISON:  Previous studies including the examination of 09/19/2022 FINDINGS: Transverse diameter of heart is slightly increased. There are no signs of alveolar pulmonary edema. There is partial opacification of right lower lung field. There are linear densities in both parahilar regions, more so on the right side. There is blunting of right lateral CP angle. Left lateral CP angle is clear. There is no pneumothorax. Tip of right IJ chest port is seen in superior vena cava. There is surgical fusion in cervical spine. IMPRESSION: There are no new infiltrates or signs of pulmonary edema. Increased markings in both parahilar regions and opacification of right lower lung field have not changed.  Small to moderate right pleural effusion. Electronically Signed   By: Ernie Avena M.D.   On: 09/22/2022 13:10   PROCEDURES: Critical Care performed: No .1-3 Lead EKG Interpretation  Performed by: Merwyn Katos, MD Authorized by: Merwyn Katos, MD     Interpretation: normal     ECG rate:  91   ECG rate assessment: normal     Rhythm: sinus rhythm     Ectopy: none     Conduction: normal    MEDICATIONS ORDERED IN ED: Medications - No data to display IMPRESSION / MDM / ASSESSMENT AND PLAN / ED COURSE  I reviewed the triage vital signs and the nursing notes.                             The patient is on the cardiac monitor to evaluate for evidence of arrhythmia and/or significant heart rate changes. Patient's presentation is most consistent with acute presentation with potential threat to life or bodily function. Workup: ECG, CXR, CBC, BMP, Troponin Findings: ECG: No overt evidence of STEMI. No evidence of Brugadas sign, delta wave, epsilon wave, significantly prolonged QTc, or malignant arrhythmia HS Troponin: Negative x1 Other Labs unremarkable for emergent problems. CXR: Without PTX, PNA, or widened mediastinum HEART Score: 4  Given History, Exam, and Workup I have low suspicion for ACS, Pneumothorax, Pneumonia, Pulmonary Embolus, Tamponade, Aortic Dissection or other emergent problem as a cause for this presentation.   Reassesment: Prior to discharge patients pain was controlled and they were well appearing.  Disposition:  Discharge. Strict return precautions discussed with patient with full understanding. Advised patient to follow up promptly with primary care provider    FINAL CLINICAL IMPRESSION(S) / ED DIAGNOSES   Final diagnoses:  Chest pain, unspecified type   Rx / DC Orders   ED Discharge Orders     None      Note:  This document was prepared using Dragon voice recognition software and may include unintentional dictation errors.   Merwyn Katos, MD 09/22/22 [phone]

## 2022-10-03 ENCOUNTER — Ambulatory Visit
Admission: RE | Admit: 2022-10-03 | Discharge: 2022-10-03 | Disposition: A | Discharge status: Home / Self Care | Payer: Medicare Other | Source: Ambulatory Visit | Attending: Oncology | Admitting: Oncology

## 2022-10-03 DIAGNOSIS — C3412 Malignant neoplasm of upper lobe, left bronchus or lung: Secondary | ICD-10-CM | POA: Diagnosis present

## 2022-10-03 LAB — GLUCOSE, CAPILLARY: Glucose-Capillary: 81 mg/dL (ref 70–99)

## 2022-10-03 MED ORDER — FLUDEOXYGLUCOSE F - 18 (FDG) INJECTION
8.2000 | Freq: Once | INTRAVENOUS | Status: AC | PRN
  Administered 2022-10-03: 8.99 via INTRAVENOUS

## 2022-10-07 MED FILL — Dexamethasone Sodium Phosphate Inj 100 MG/10ML: INTRAMUSCULAR | Qty: 1 | Status: AC

## 2022-10-10 ENCOUNTER — Inpatient Hospital Stay: Payer: Medicare Other | Admitting: Oncology

## 2022-10-10 ENCOUNTER — Encounter: Payer: Self-pay | Admitting: Oncology

## 2022-10-10 ENCOUNTER — Inpatient Hospital Stay: Payer: Medicare Other

## 2022-10-10 VITALS — BP 113/71 | HR 103 | Temp 99.3°F | Resp 18 | Ht 61.0 in | Wt 163.4 lb

## 2022-10-10 DIAGNOSIS — Z5111 Encounter for antineoplastic chemotherapy: Secondary | ICD-10-CM | POA: Diagnosis not present

## 2022-10-10 DIAGNOSIS — C3412 Malignant neoplasm of upper lobe, left bronchus or lung: Secondary | ICD-10-CM | POA: Diagnosis not present

## 2022-10-10 LAB — COMPREHENSIVE METABOLIC PANEL
ALT: 15 U/L (ref 0–44)
AST: 20 U/L (ref 15–41)
Albumin: 2.9 g/dL — ABNORMAL LOW (ref 3.5–5.0)
Alkaline Phosphatase: 76 U/L (ref 38–126)
Anion gap: 7 (ref 5–15)
BUN: 10 mg/dL (ref 8–23)
CO2: 29 mmol/L (ref 22–32)
Calcium: 8.3 mg/dL — ABNORMAL LOW (ref 8.9–10.3)
Chloride: 96 mmol/L — ABNORMAL LOW (ref 98–111)
Creatinine, Ser: 0.45 mg/dL (ref 0.44–1.00)
GFR, Estimated: >60 mL/min (ref >60)
Glucose, Bld: 96 mg/dL (ref 70–99)
Potassium: 3.6 mmol/L (ref 3.5–5.1)
Sodium: 132 mmol/L — ABNORMAL LOW (ref 135–145)
Total Bilirubin: 0.3 mg/dL (ref 0.3–1.2)
Total Protein: 6.1 g/dL — ABNORMAL LOW (ref 6.5–8.1)

## 2022-10-10 LAB — CBC WITH DIFFERENTIAL/PLATELET
Abs Immature Granulocytes: 0.01 10*3/uL (ref 0.00–0.07)
Basophils Absolute: 0 10*3/uL (ref 0.0–0.1)
Basophils Relative: 1 %
Eosinophils Absolute: 0.1 10*3/uL (ref 0.0–0.5)
Eosinophils Relative: 3 %
HCT: 28.9 % — ABNORMAL LOW (ref 36.0–46.0)
Hemoglobin: 9.3 g/dL — ABNORMAL LOW (ref 12.0–15.0)
Immature Granulocytes: 0 %
Lymphocytes Relative: 9 %
Lymphs Abs: 0.4 10*3/uL — ABNORMAL LOW (ref 0.7–4.0)
MCH: 30.4 pg (ref 26.0–34.0)
MCHC: 32.2 g/dL (ref 30.0–36.0)
MCV: 94.4 fL (ref 80.0–100.0)
Monocytes Absolute: 0.7 10*3/uL (ref 0.1–1.0)
Monocytes Relative: 15 %
Neutro Abs: 3.3 10*3/uL (ref 1.7–7.7)
Neutrophils Relative %: 72 %
Platelets: 402 10*3/uL — ABNORMAL HIGH (ref 150–400)
RBC: 3.06 MIL/uL — ABNORMAL LOW (ref 3.87–5.11)
RDW: 16.6 % — ABNORMAL HIGH (ref 11.5–15.5)
WBC: 4.6 10*3/uL (ref 4.0–10.5)
nRBC: 0 % (ref 0.0–0.2)

## 2022-10-10 MED ORDER — SODIUM CHLORIDE 0.9% FLUSH
10.0000 mL | Freq: Once | INTRAVENOUS | Status: AC
  Administered 2022-10-10: 10 mL via INTRAVENOUS
  Filled 2022-10-10: qty 10

## 2022-10-10 MED ORDER — HEPARIN SOD (PORK) LOCK FLUSH 100 UNIT/ML IV SOLN
500.0000 [IU] | Freq: Once | INTRAVENOUS | Status: AC
  Administered 2022-10-10: 500 [IU] via INTRAVENOUS
  Filled 2022-10-10: qty 5

## 2022-10-10 NOTE — Progress Notes (Signed)
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  EGFR and Ros as well as MET testing negative. PD-L1 was 50%   Patient completed concurrent chemoradiation with weekly carbotaxol chemotherapy on 03/12/2019.  Maintenance durvalumab started in January 2021 after scan showed partial response.  Patient completed 1 year of maintenance durvalumab in February 2022   Patient was found to have enlarging mediastinal adenopathyOn subsequent CT scan in January 2023.  This was followed by a PET scan which showed hypermetabolic mediastinal and right hilar lymph nodes but no other evidence of disease elsewhere.  This was biopsied and was consistent with non-small cell lung cancer specifically adenocarcinoma.  Cells were positive for TTF-1 and Napsin A and negative for p40.   Patient completed concurrent chemoradiation with 4 cycles of carbo Alimta ending in May 2023.  Scan showed partial response.  Plan is for maintenance Alimta until progression or toxicity.  Alimta was stopped in November 2023 due to progressive anemia and stable scans   Patient was hospitalized in November  2023 for acute on chronic hypoxic respiratory failure secondary to healthcare associated pneumonia.  She had pleural effusions which were tapped and thoracentesis was negative for malignancy.   Scans in March 2024 showed concern for progressive disease with appearance of new right lung nodules as well as progressive mediastinal adenopathy.  Also noted to have right pleural effusion which was tapped and was negative for malignancy.  Single agent Alimta was restarted  Interval history-patient has been feeling more short of breath over the last 2 weeks.  No recent falls.  Also reports more fatigue.  ECOG PS- 3 Pain scale- 0 Opioid associated constipation- no  Review of systems- Review of Systems  Constitutional:  Positive for malaise/fatigue. Negative for chills, fever and weight loss.  HENT:  Negative for congestion, ear discharge and nosebleeds.   Eyes:  Negative for blurred vision.  Respiratory:  Positive for shortness of breath. Negative for cough, hemoptysis, sputum production and wheezing.   Cardiovascular:  Negative for chest pain, palpitations, orthopnea and claudication.  Gastrointestinal:  Negative for abdominal pain, blood in stool, constipation, diarrhea, heartburn, melena, nausea and vomiting.  Genitourinary:  Negative for dysuria, flank pain, frequency, hematuria and urgency.  Musculoskeletal:  Negative for back pain, joint pain and myalgias.  Skin:  Negative for rash.  Neurological:  Negative for dizziness, tingling, focal weakness, seizures, weakness and headaches.  Endo/Heme/Allergies:  Does not bruise/bleed easily.  Psychiatric/Behavioral:  Negative for depression and suicidal ideas. The patient does not have insomnia.       Allergies  Allergen Reactions   Aspirin Hives, Shortness Of Breath and Other (See Comments)    Difficulty breathing   Celebrex [Celecoxib] Shortness Of Breath  Morphine And Codeine Nausea And Vomiting and Swelling   Adhesive [Tape] Other (See  Comments)    Took top layer of skin off when removed.   Clarithromycin Nausea And Vomiting   Codeine Nausea And Vomiting   Darvon [Propoxyphene Hcl] Nausea And Vomiting   Demerol [Meperidine] Nausea And Vomiting   Flonase [Fluticasone Propionate] Other (See Comments)    Fungal infection in nose   Simvastatin Other (See Comments)    "caused ulcers in mouth, and fever"   Talwin [Pentazocine] Nausea And Vomiting     Past Medical History:  Diagnosis Date   Anemia    Asthma    No Inhalers--Dr. Meredeth Ide will order as needed   Bronchiectasis (HCC)    mild   Chronic headaches     followed by [facility] migraines   COPD (chronic obstructive pulmonary disease) (HCC)    DDD (degenerative disc disease), lumbar    Diverticulosis    Family history of adverse reaction to anesthesia    mother and sisters-PONV   Gall stones    history of   GERD (gastroesophageal reflux disease)    EGD 8/09- non bleeding erosive gastritis, documentd esophageal ulcerations.    Hiatal hernia    small   History of kidney stones    History of pneumonia    Hypercholesterolemia    IBS (irritable bowel syndrome)    Malignant neoplasm of upper lobe of left lung (HCC) 12/27/2018   Meniere disease    Murmur    Osteoarthritis    lumbar disc disease, left hip   Personal history of chemotherapy    Personal history of radiation therapy    Pneumonia 12/2020   PONV (postoperative nausea and vomiting)    Sleep apnea    uses cpap   Vertigo    Weakness of right side of body      Past Surgical History:  Procedure Laterality Date   ABDOMINAL HYSTERECTOMY  age 1   ANTERIOR CERVICAL DECOMP/DISCECTOMY FUSION N/A 02/24/2015   Procedure: CERVICAL FOUR-FIVE, CERVICAL FIVE-SIX, CERVICAL SIX-SEVEN ANTERIOR CERVICAL DECOMPRESSION/DISCECTOMY FUSION ;  Surgeon: Lisbeth Renshaw, MD;  Location: [facility];  Service: Neurosurgery;  Laterality: N/A;  C45 C56 C67 anterior cervical decompression with fusion interbody  prosthesis plating and bonegraft   APPENDECTOMY     BACK SURGERY  1988   4th lumbar fusion   BLADDER SURGERY N/A    with vaginal wall repair   BREAST CYST ASPIRATION Bilateral    neg   BREAST SURGERY Bilateral    cyst removed and reduction   CARDIAC CATHETERIZATION  2014   CARPAL TUNNEL RELEASE Right 02/11/2016   Procedure: CARPAL TUNNEL RELEASE;  Surgeon: Kennedy Bucker, MD;  Location: [facility] ORS;  Service: Orthopedics;  Laterality: Right;   CATARACT EXTRACTION W/ INTRAOCULAR LENS IMPLANT Bilateral 2015   CHOLECYSTECTOMY     EUS N/A 05/31/2012   Procedure: UPPER ENDOSCOPIC ULTRASOUND (EUS) LINEAR;  Surgeon: Rachael Fee, MD;  Location: [facility];  Service: Endoscopy;  Laterality: N/A;   EXCISIONAL HEMORRHOIDECTOMY     JOINT REPLACEMENT Bilateral    KNEE ARTHROSCOPY WITH LATERAL MENISECTOMY Right 07/07/2015   Procedure: KNEE ARTHROSCOPY WITH LATERAL MENISECTOMY, PARTIAL SYNOVECTOMY;  Surgeon: Kennedy Bucker, MD;  Location: [facility] ORS;  Service: Orthopedics;  Laterality: Right;   LUMBAR LAMINECTOMY     PORTA CATH INSERTION N/A 01/07/2019   Procedure: PORTA CATH INSERTION;  Surgeon: Annice Needy, MD;  Location: [facility];  Service: Cardiovascular;  Laterality: N/A;  REDUCTION MAMMAPLASTY  1990   RIGHT OOPHORECTOMY     TOTAL HIP ARTHROPLASTY Left 05/01/2014   Dr. Sheppard Evens   TOTAL HIP ARTHROPLASTY Right 08/05/2014   Procedure: TOTAL HIP ARTHROPLASTY ANTERIOR APPROACH;  Surgeon: Kennedy Bucker, MD;  Location: [facility] OR[facility];  Service: Orthopedics;  Laterality: Right;   ULNAR NERVE TRANSPOSITION Right 02/11/2016   Procedure: ULNAR NERVE DECOMPRESSION/TRANSPOSITION;  Surgeon: Kennedy Bucker, MD;  Location: [facility];  Service: Orthopedics;  Laterality: Right;   VIDEO BRONCHOSCOPY WITH ENDOBRONCHIAL ULTRASOUND Left 12/19/2018   Procedure: VIDEO BRONCHOSCOPY WITH ENDOBRONCHIAL ULTRASOUND, LEFT, SLEEP APNEA;  Surgeon: Vida Rigger, MD;  Location: [facility] OR[facility];  Service: Thoracic;   Laterality: Left;   VIDEO BRONCHOSCOPY WITH ENDOBRONCHIAL ULTRASOUND Right 05/14/2021   Procedure: VIDEO BRONCHOSCOPY WITH ENDOBRONCHIAL ULTRASOUND;  Surgeon: Salena Saner, MD;  Location: [facility] OR[facility];  Service: Pulmonary;  Laterality: Right;    Social History   Socioeconomic History   Marital status: Married    Spouse name: Not on file   Number of children: Not on file   Years of education: Not on file   Highest education level: Not on file  Occupational History   Not on file  Tobacco Use   Smoking status: Former    Current packs/day: 0.00    Average packs/day: 1 pack/day for 40.0 years (40.0 ttl pk-yrs)    Types: Cigarettes    Start date: 06/13/1967    Quit date: 06/13/2007    Years since quitting: 15.3   Smokeless tobacco: Never  Vaping Use   Vaping status: Never Used  Substance and Sexual Activity   Alcohol use: No    Alcohol/week: 0.0 standard drinks of alcohol   Drug use: No   Sexual activity: Not on file  Other Topics Concern   Not on file  Social History Narrative   Not on file   Social Determinants of Health   Financial Resource Strain: Not on file  Food Insecurity: No Food Insecurity (08/08/2022)   Hunger Vital Sign    Worried About Running Out of Food in the Last Year: Never true    Ran Out of Food in the Last Year: Never true  Transportation Needs: No Transportation Needs (08/08/2022)   PRAPARE - Administrator, Civil Service (Medical): No    Lack of Transportation (Non-Medical): No  Physical Activity: Not on file  Stress: Not on file  Social Connections: Not on file  Intimate Partner Violence: Not At Risk (08/08/2022)   Humiliation, Afraid, Rape, and Kick questionnaire    Fear of Current or Ex-Partner: No    Emotionally Abused: No    Physically Abused: No    Sexually Abused: No    Family History  Problem Relation Age of Onset   Heart disease Mother        s/p stent   Hypertension Mother    Hypercholesterolemia Mother    Diabetes Father     Stomach cancer Other        uncle   Breast cancer Cousin    Breast cancer Paternal Aunt      Current Outpatient Medications:    acetaminophen (TYLENOL) 500 MG tablet, Take 500 mg by mouth 2 (two) times daily., Disp: , Rfl:    apixaban (ELIQUIS) 5 MG TABS tablet, Take 1 tablet (5 mg total) by mouth 2 (two) times daily., Disp: 60 tablet, Rfl: 0   Bioflavonoid Products (ESTER C PO), Take 1 tablet by mouth daily. 500 mg, Disp: , Rfl:  Calcium-Magnesium-Zinc (CAL-MAG-ZINC PO), Take 1 tablet by mouth daily., Disp: , Rfl:    cyclobenzaprine (FLEXERIL) 10 MG tablet, Take 10 mg by mouth at bedtime., Disp: , Rfl:    ezetimibe (ZETIA) 10 MG tablet, Take 5 mg by mouth in the morning and at bedtime., Disp: , Rfl:    glycerin adult 2 g suppository, Place 1 suppository rectally as needed for constipation., Disp: , Rfl:    Histamine Dihydrochloride (AUSTRALIAN DREAM ARTHRITIS) 0.025 % CREA, Apply 1 application  topically 4 (four) times daily as needed (pain)., Disp: , Rfl:    HYDROcodone-acetaminophen (NORCO/VICODIN) 5-325 MG tablet, Take 1 tablet by mouth at bedtime., Disp: 30 tablet, Rfl: 0   ipratropium-albuterol (DUONEB) 0.5-2.5 (3) MG/3ML SOLN, Take 3 mLs by nebulization every 6 (six) hours as needed., Disp: 360 mL, Rfl: 0   metoprolol tartrate (LOPRESSOR) 25 MG tablet, Take 0.5 tablets (12.5 mg total) by mouth 2 (two) times daily., Disp: 30 tablet, Rfl: 2   ondansetron (ZOFRAN) 8 MG tablet, Take 1 tablet (8 mg total) by mouth every 8 (eight) hours as needed for nausea or vomiting., Disp: 20 tablet, Rfl: 0   pantoprazole (PROTONIX) 40 MG tablet, Take 40 mg by mouth 2 (two) times daily., Disp: , Rfl:    Polyethyl Glyc-Propyl Glyc PF (SYSTANE HYDRATION PF) 0.4-0.3 % SOLN, Place 1 drop into both eyes as needed. as, Disp: , Rfl:    polyethylene glycol powder (GLYCOLAX/MIRALAX) 17 GM/SCOOP powder, Take 17 g by mouth daily as needed for moderate constipation., Disp: , Rfl:    Sennosides (SENNA) 8.6 MG  CAPS, Take 1 capsule by mouth daily as needed (constipation)., Disp: , Rfl:    spironolactone (ALDACTONE) 50 MG tablet, Hold until followup with outpatient provider due to acute kidney injury., Disp: , Rfl:    torsemide (DEMADEX) 20 MG tablet, Hold until followup with outpatient provider due to acute kidney injury., Disp: , Rfl:    triamcinolone (NASACORT) 55 MCG/ACT AERO nasal inhaler, Place 2 sprays into the nose daily as needed., Disp: , Rfl:    UBRELVY 100 MG TABS, Take 100 mg by mouth daily as needed (migraines)., Disp: , Rfl:    Wheat Dextrin (EQ FIBER POWDER PO), Take 1 Dose by mouth as needed., Disp: , Rfl:    loratadine (CLARITIN) 10 MG tablet, Take 10 mg by mouth daily. (Patient not taking: Reported on 09/19/2022), Disp: , Rfl:  No current facility-administered medications for this visit.  Facility-Administered Medications Ordered in Other Visits:    sodium chloride flush (NS) 0.9 % injection 10 mL, 10 mL, Intravenous, Once, Creig Hines, MD  Physical exam:  Vitals:   10/10/22 0854  BP: 113/71  Pulse: (!) 103  Resp: 18  Temp: 99.3 F (37.4 C)  TempSrc: Tympanic  SpO2: 98%  Weight: 163 lb 6.4 oz (74.1 kg)  Height: 5\' 1"  (1.549 m)   Physical Exam Cardiovascular:     Rate and Rhythm: Regular rhythm. Tachycardia present.     Heart sounds: Normal heart sounds.  Pulmonary:     Effort: Pulmonary effort is normal.     Comments: Scattered bilateral wheezing Abdominal:     General: Bowel sounds are normal.     Palpations: Abdomen is soft.  Skin:    General: Skin is warm and dry.  Neurological:     Mental Status: She is alert and oriented to person, place, and time.         Latest Ref Rng & Units 09/22/2022  4:02 PM  CMP  Glucose 70 - 99 mg/dL 95   BUN 8 - 23 mg/dL 19   Creatinine 4.09 - 1.00 mg/dL 8.11   Sodium 914 - 782 mmol/L 136   Potassium 3.5 - 5.1 mmol/L 3.9   Chloride 98 - 111 mmol/L 98   CO2 22 - 32 mmol/L 29   Calcium 8.9 - 10.3 mg/dL 9.2       Latest  Ref Rng & Units 09/22/2022    4:02 PM  CBC  WBC 4.0 - 10.5 K/uL 6.0   Hemoglobin 12.0 - 15.0 g/dL 95.6   Hematocrit 21.3 - 46.0 % 32.4   Platelets 150 - 400 K/uL 463     No images are attached to the encounter.  DG Chest 2 View  Result Date: 09/23/2022 CLINICAL DATA:  Lung cancer currently undergoing treatment, short of breath EXAM: CHEST - 2 VIEW COMPARISON:  08/18/2022 FINDINGS: Frontal and lateral views of the chest demonstrates stable right chest wall port. Cardiac silhouette is unremarkable. Chronic densities in the bilateral perihilar regions are likely post therapeutic. Chronic small right pleural effusion versus pleural thickening. No new airspace disease or pneumothorax. No acute bony abnormalities. IMPRESSION: 1. No significant change. Stable small right pleural effusion versus pleural thickening, as well as post therapeutic changes within the perihilar regions as before. Electronically Signed   By: Sharlet Salina M.D.   On: 09/23/2022 10:00   DG Chest 2 View  Result Date: 09/22/2022 CLINICAL DATA:  Chest pain, undergoing chemotherapy for lung carcinoma EXAM: CHEST - 2 VIEW COMPARISON:  Previous studies including the examination of 09/19/2022 FINDINGS: Transverse diameter of heart is slightly increased. There are no signs of alveolar pulmonary edema. There is partial opacification of right lower lung field. There are linear densities in both parahilar regions, more so on the right side. There is blunting of right lateral CP angle. Left lateral CP angle is clear. There is no pneumothorax. Tip of right IJ chest port is seen in superior vena cava. There is surgical fusion in cervical spine. IMPRESSION: There are no new infiltrates or signs of pulmonary edema. Increased markings in both parahilar regions and opacification of right lower lung field have not changed. Small to moderate right pleural effusion. Electronically Signed   By: Ernie Avena M.D.   On: 09/22/2022 13:10      Assessment and plan- Patient is a 77 y.o. female with history of locally recurrent non-small cell lung cancer adenocarcinoma.  SheIs here for on treatment assessment prior to cycle 6 of maintenance Alimta  PET CT scan report was pending at the time of my visit today.  I have reviewed PET CT scan images independently and I will be calling the patient later today with the results.  PET scan does not Show any evidence of local progression or distant metastatic disease.  Overall mediastinal adenopathy has improved.  She has a small right pleural effusion.  Given that she feels more fatigued and is more short of breath today I am holding her treatment.  Her hemoglobin is also lower at 9.3 as compared to 10.4.  I will defer her treatment by 2 weeks.  Will also reach out to Dr. Evlyn Clines to see if there is anything else that can be done to optimize her respiratory status.  She has significant bilateral wheezing on today's exam.  I will consider dose reducing Alimta with next cycle given her anemia.   Visit Diagnosis 1. Encounter for antineoplastic chemotherapy  2. Malignant neoplasm of upper lobe of left lung (HCC)      Dr. Owens Shark, MD, MPH [facility] [phone] 10/10/2022 8:34 AM

## 2022-10-10 NOTE — Progress Notes (Signed)
No treatment today per MD.  

## 2022-10-19 ENCOUNTER — Other Ambulatory Visit: Payer: Self-pay | Admitting: Pulmonary Disease

## 2022-10-19 DIAGNOSIS — R079 Chest pain, unspecified: Secondary | ICD-10-CM

## 2022-10-19 DIAGNOSIS — R000 Tachycardia, unspecified: Secondary | ICD-10-CM

## 2022-10-20 ENCOUNTER — Other Ambulatory Visit: Payer: Self-pay | Admitting: Pulmonary Disease

## 2022-10-20 ENCOUNTER — Ambulatory Visit
Admission: RE | Admit: 2022-10-20 | Discharge: 2022-10-20 | Disposition: A | Discharge status: Home / Self Care | Payer: Medicare Other | Source: Ambulatory Visit | Attending: Pulmonary Disease | Admitting: Pulmonary Disease

## 2022-10-20 DIAGNOSIS — R000 Tachycardia, unspecified: Secondary | ICD-10-CM | POA: Insufficient documentation

## 2022-10-20 DIAGNOSIS — R079 Chest pain, unspecified: Secondary | ICD-10-CM | POA: Diagnosis present

## 2022-10-20 MED ORDER — IOHEXOL 350 MG/ML SOLN
100.0000 mL | Freq: Once | INTRAVENOUS | Status: AC | PRN
  Administered 2022-10-20: 75 mL via INTRAVENOUS

## 2022-10-21 MED FILL — Dexamethasone Sodium Phosphate Inj 100 MG/10ML: INTRAMUSCULAR | Qty: 1 | Status: AC

## 2022-10-22 ENCOUNTER — Encounter: Payer: Self-pay | Admitting: Oncology

## 2022-10-22 ENCOUNTER — Other Ambulatory Visit: Payer: Self-pay | Admitting: *Deleted

## 2022-10-22 DIAGNOSIS — R530 Neoplastic (malignant) related fatigue: Secondary | ICD-10-CM

## 2022-10-22 DIAGNOSIS — C3412 Malignant neoplasm of upper lobe, left bronchus or lung: Secondary | ICD-10-CM

## 2022-10-22 DIAGNOSIS — R0602 Shortness of breath: Secondary | ICD-10-CM

## 2022-10-24 ENCOUNTER — Inpatient Hospital Stay (HOSPITAL_BASED_OUTPATIENT_CLINIC_OR_DEPARTMENT_OTHER): Payer: Medicare Other | Admitting: Oncology

## 2022-10-24 ENCOUNTER — Encounter: Payer: Self-pay | Admitting: Oncology

## 2022-10-24 ENCOUNTER — Inpatient Hospital Stay: Payer: Medicare Other | Attending: Oncology

## 2022-10-24 ENCOUNTER — Ambulatory Visit: Payer: Medicare Other

## 2022-10-24 ENCOUNTER — Inpatient Hospital Stay: Payer: Medicare Other

## 2022-10-24 VITALS — BP 114/71 | HR 99 | Temp 96.2°F | Resp 18 | Ht 61.0 in | Wt 157.1 lb

## 2022-10-24 VITALS — BP 119/79 | HR 97 | Temp 96.7°F | Resp 18

## 2022-10-24 DIAGNOSIS — J9611 Chronic respiratory failure with hypoxia: Secondary | ICD-10-CM | POA: Diagnosis not present

## 2022-10-24 DIAGNOSIS — C3412 Malignant neoplasm of upper lobe, left bronchus or lung: Secondary | ICD-10-CM

## 2022-10-24 DIAGNOSIS — Z5111 Encounter for antineoplastic chemotherapy: Secondary | ICD-10-CM

## 2022-10-24 DIAGNOSIS — D649 Anemia, unspecified: Secondary | ICD-10-CM

## 2022-10-24 DIAGNOSIS — I3139 Other pericardial effusion (noninflammatory): Secondary | ICD-10-CM | POA: Diagnosis not present

## 2022-10-24 DIAGNOSIS — J4489 Other specified chronic obstructive pulmonary disease: Secondary | ICD-10-CM | POA: Diagnosis not present

## 2022-10-24 DIAGNOSIS — Z79631 Long term (current) use of antimetabolite agent: Secondary | ICD-10-CM | POA: Diagnosis not present

## 2022-10-24 DIAGNOSIS — R0602 Shortness of breath: Secondary | ICD-10-CM

## 2022-10-24 DIAGNOSIS — R110 Nausea: Secondary | ICD-10-CM

## 2022-10-24 DIAGNOSIS — R530 Neoplastic (malignant) related fatigue: Secondary | ICD-10-CM

## 2022-10-24 LAB — CBC WITH DIFFERENTIAL/PLATELET
Abs Immature Granulocytes: 0.02 10*3/uL (ref 0.00–0.07)
Basophils Absolute: 0.1 10*3/uL (ref 0.0–0.1)
Basophils Relative: 1 %
Eosinophils Absolute: 0.5 10*3/uL (ref 0.0–0.5)
Eosinophils Relative: 8 %
HCT: 32 % — ABNORMAL LOW (ref 36.0–46.0)
Hemoglobin: 10.2 g/dL — ABNORMAL LOW (ref 12.0–15.0)
Immature Granulocytes: 0 %
Lymphocytes Relative: 9 %
Lymphs Abs: 0.6 10*3/uL — ABNORMAL LOW (ref 0.7–4.0)
MCH: 29.1 pg (ref 26.0–34.0)
MCHC: 31.9 g/dL (ref 30.0–36.0)
MCV: 91.4 fL (ref 80.0–100.0)
Monocytes Absolute: 0.6 10*3/uL (ref 0.1–1.0)
Monocytes Relative: 10 %
Neutro Abs: 4.6 10*3/uL (ref 1.7–7.7)
Neutrophils Relative %: 72 %
Platelets: 364 10*3/uL (ref 150–400)
RBC: 3.5 MIL/uL — ABNORMAL LOW (ref 3.87–5.11)
RDW: 15.4 % (ref 11.5–15.5)
WBC: 6.3 10*3/uL (ref 4.0–10.5)
nRBC: 0 % (ref 0.0–0.2)

## 2022-10-24 LAB — COMPREHENSIVE METABOLIC PANEL
ALT: 11 U/L (ref 0–44)
AST: 21 U/L (ref 15–41)
Albumin: 3.5 g/dL (ref 3.5–5.0)
Alkaline Phosphatase: 77 U/L (ref 38–126)
Anion gap: 12 (ref 5–15)
BUN: 13 mg/dL (ref 8–23)
CO2: 29 mmol/L (ref 22–32)
Calcium: 9.2 mg/dL (ref 8.9–10.3)
Chloride: 95 mmol/L — ABNORMAL LOW (ref 98–111)
Creatinine, Ser: 0.72 mg/dL (ref 0.44–1.00)
GFR, Estimated: >60 mL/min (ref >60)
Glucose, Bld: 82 mg/dL (ref 70–99)
Potassium: 3.5 mmol/L (ref 3.5–5.1)
Sodium: 136 mmol/L (ref 135–145)
Total Bilirubin: 0.3 mg/dL (ref 0.3–1.2)
Total Protein: 6.9 g/dL (ref 6.5–8.1)

## 2022-10-24 LAB — TSH: TSH: 3.698 u[IU]/mL (ref 0.350–4.500)

## 2022-10-24 MED ORDER — SODIUM CHLORIDE 0.9 % IV SOLN
Freq: Once | INTRAVENOUS | Status: AC
  Filled 2022-10-24: qty 250

## 2022-10-24 MED ORDER — SODIUM CHLORIDE 0.9 % IV SOLN
10.0000 mg | Freq: Once | INTRAVENOUS | Status: AC
  Administered 2022-10-24: 10 mg via INTRAVENOUS
  Filled 2022-10-24: qty 10
  Filled 2022-10-24: qty 1

## 2022-10-24 MED ORDER — HEPARIN SOD (PORK) LOCK FLUSH 100 UNIT/ML IV SOLN
500.0000 [IU] | Freq: Once | INTRAVENOUS | Status: DC
  Filled 2022-10-24: qty 5

## 2022-10-24 MED ORDER — HEPARIN SOD (PORK) LOCK FLUSH 100 UNIT/ML IV SOLN
500.0000 [IU] | Freq: Once | INTRAVENOUS | Status: AC | PRN
  Administered 2022-10-24: 500 [IU]
  Filled 2022-10-24: qty 5

## 2022-10-24 MED ORDER — SODIUM CHLORIDE 0.9% FLUSH
10.0000 mL | Freq: Once | INTRAVENOUS | Status: AC
  Administered 2022-10-24: 10 mL via INTRAVENOUS
  Filled 2022-10-24: qty 10

## 2022-10-24 MED ORDER — CYANOCOBALAMIN 1000 MCG/ML IJ SOLN
1000.0000 ug | Freq: Once | INTRAMUSCULAR | Status: DC
Start: 2022-10-24 — End: 2022-10-24

## 2022-10-24 MED ORDER — CYANOCOBALAMIN 1000 MCG/ML IJ SOLN
1000.0000 ug | Freq: Once | INTRAMUSCULAR | Status: AC
  Administered 2022-10-24: 1000 ug via INTRAMUSCULAR
  Filled 2022-10-24: qty 1

## 2022-10-24 MED ORDER — SODIUM CHLORIDE 0.9 % IV SOLN
500.0000 mg/m2 | Freq: Once | INTRAVENOUS | Status: AC
  Administered 2022-10-24: 900 mg via INTRAVENOUS
  Filled 2022-10-24: qty 20

## 2022-10-24 NOTE — Patient Instructions (Signed)
[facility]  Discharge Instructions: Thank you for choosing [facility] to provide your oncology and hematology care.  If you have a lab appointment with the Cancer Center, please go directly to the Cancer Center and check in at the registration area.  Wear comfortable clothing and clothing appropriate for easy access to any Portacath or PICC line.   We strive to give you quality time with your provider. You may need to reschedule your appointment if you arrive late (15 or more minutes).  Arriving late affects you and other patients whose appointments are after yours.  Also, if you miss three or more appointments without notifying the office, you may be dismissed from the clinic at the provider's discretion.      For prescription refill requests, have your pharmacy contact our office and allow 72 hours for refills to be completed.    Today you received the following chemotherapy and/or immunotherapy agents Alimta      To help prevent nausea and vomiting after your treatment, we encourage you to take your nausea medication as directed.  BELOW ARE SYMPTOMS THAT SHOULD BE REPORTED IMMEDIATELY: *FEVER GREATER THAN 100.4 F (38 C) OR HIGHER *CHILLS OR SWEATING *NAUSEA AND VOMITING THAT IS NOT CONTROLLED WITH YOUR NAUSEA MEDICATION *UNUSUAL SHORTNESS OF BREATH *UNUSUAL BRUISING OR BLEEDING *URINARY PROBLEMS (pain or burning when urinating, or frequent urination) *BOWEL PROBLEMS (unusual diarrhea, constipation, pain near the anus) TENDERNESS IN MOUTH AND THROAT WITH OR WITHOUT PRESENCE OF ULCERS (sore throat, sores in mouth, or a toothache) UNUSUAL RASH, SWELLING OR PAIN  UNUSUAL VAGINAL DISCHARGE OR ITCHING   Items with * indicate a potential emergency and should be followed up as soon as possible or go to the Emergency Department if any problems should occur.  Please show the CHEMOTHERAPY ALERT CARD or IMMUNOTHERAPY ALERT CARD at check-in to the  Emergency Department and triage nurse.  Should you have questions after your visit or need to cancel or reschedule your appointment, please contact [facility]  [phone] and follow the prompts.  Office hours are 8:00 a.m. to 4:30 p.m. Monday - Friday. Please note that voicemails left after 4:00 p.m. may not be returned until the following business day.  We are closed weekends and major holidays. You have access to a nurse at all times for urgent questions. Please call the main number to the clinic [phone] and follow the prompts.  For any non-urgent questions, you may also contact your provider using MyChart. We now offer e-Visits for anyone 62 and older to request care online for non-urgent symptoms. For details visit [email or web address].   Also download the MyChart app! Go to the app store, search "MyChart", open the app, select [place], and log in with your MyChart username and password.

## 2022-10-25 ENCOUNTER — Other Ambulatory Visit: Payer: Self-pay

## 2022-10-25 ENCOUNTER — Ambulatory Visit
Admission: RE | Admit: 2022-10-25 | Discharge: 2022-10-25 | Disposition: A | Discharge status: Home / Self Care | Payer: Medicare Other | Source: Ambulatory Visit | Attending: Pulmonary Disease | Admitting: Pulmonary Disease

## 2022-10-25 ENCOUNTER — Encounter: Payer: Self-pay | Admitting: Oncology

## 2022-10-25 DIAGNOSIS — R079 Chest pain, unspecified: Secondary | ICD-10-CM | POA: Diagnosis present

## 2022-10-25 DIAGNOSIS — R000 Tachycardia, unspecified: Secondary | ICD-10-CM | POA: Diagnosis not present

## 2022-10-25 NOTE — Progress Notes (Signed)
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  EGFR and Ros as well as MET testing negative. PD-L1 was 50%   Patient completed concurrent chemoradiation with weekly carbotaxol chemotherapy on 03/12/2019.  Maintenance durvalumab started in January 2021 after scan showed partial response.  Patient completed 1 year of maintenance durvalumab in February 2022   Patient was found to have enlarging mediastinal adenopathyOn subsequent CT scan in January 2023.  This was followed by a PET scan which showed hypermetabolic mediastinal and right hilar lymph nodes but no other evidence of disease elsewhere.  This was biopsied and was consistent with non-small cell lung cancer specifically adenocarcinoma.  Cells were positive for TTF-1 and Napsin A and negative for p40.   Patient completed concurrent chemoradiation with 4 cycles of carbo Alimta ending in May 2023.  Scan showed partial response.  Plan is for maintenance Alimta until progression or toxicity.  Alimta was stopped in November 2023 due to progressive anemia and stable scans   Patient was hospitalized in November  2023 for acute on chronic hypoxic respiratory failure secondary to healthcare associated pneumonia.  She had pleural effusions which were tapped and thoracentesis was negative for malignancy.   Scans in March 2024 showed concern for progressive disease with appearance of new right lung nodules as well as progressive mediastinal adenopathy.  Also noted to have right pleural effusion which was tapped and was negative for malignancy.  Single agent Alimta was restarted    Interval history-patient continues to have chronic shortness of breath and was recently evaluated by Dr. Erling Conte as well.  SheUnderwent CT angio chest which did not show any evidence of pulmonary embolism.  Small left pleural and pericardial effusion.  Stable postradiation changes in the hilum and right basilar regions.  ECOG PS- 2 Pain scale- 3  Review of systems- Review of Systems  Constitutional:  Positive for malaise/fatigue. Negative for chills, fever and weight loss.  HENT:  Negative for congestion, ear discharge and nosebleeds.   Eyes:  Negative for blurred vision.  Respiratory:  Positive for shortness of breath. Negative for cough, hemoptysis, sputum production and wheezing.   Cardiovascular:  Negative for chest pain, palpitations, orthopnea and claudication.  Gastrointestinal:  Negative for abdominal pain, blood in stool, constipation, diarrhea, heartburn, melena, nausea and vomiting.  Genitourinary:  Negative for dysuria, flank pain, frequency, hematuria and urgency.  Musculoskeletal:  Negative for back pain, joint pain and myalgias.  Skin:  Negative for rash.  Neurological:  Negative for dizziness, tingling, focal weakness, seizures, weakness and headaches.  Endo/Heme/Allergies:  Does not bruise/bleed easily.  Psychiatric/Behavioral:  Negative for depression and suicidal ideas. The patient does not have insomnia.       Allergies  Allergen  Reactions   Aspirin Hives, Shortness Of Breath and Other (See Comments)     Difficulty breathing   Celebrex [Celecoxib] Shortness Of Breath   Morphine And Codeine Nausea And Vomiting and Swelling   Adhesive [Tape] Other (See Comments)    Took top layer of skin off when removed.   Clarithromycin Nausea And Vomiting   Codeine Nausea And Vomiting   Darvon [Propoxyphene Hcl] Nausea And Vomiting   Demerol [Meperidine] Nausea And Vomiting   Flonase [Fluticasone Propionate] Other (See Comments)    Fungal infection in nose   Simvastatin Other (See Comments)    "caused ulcers in mouth, and fever"   Talwin [Pentazocine] Nausea And Vomiting     Past Medical History:  Diagnosis Date   Anemia    Asthma    No Inhalers--Dr. Meredeth Ide will order as needed   Bronchiectasis (HCC)    mild   Chronic headaches     followed by [facility] migraines   COPD (chronic obstructive pulmonary disease) (HCC)    DDD (degenerative disc disease), lumbar    Diverticulosis    Family history of adverse reaction to anesthesia    mother and sisters-PONV   Gall stones    history of   GERD (gastroesophageal reflux disease)    EGD 8/09- non bleeding erosive gastritis, documentd esophageal ulcerations.    Hiatal hernia    small   History of kidney stones    History of pneumonia    Hypercholesterolemia    IBS (irritable bowel syndrome)    Malignant neoplasm of upper lobe of left lung (HCC) 12/27/2018   Meniere disease    Murmur    Osteoarthritis    lumbar disc disease, left hip   Personal history of chemotherapy    Personal history of radiation therapy    Pneumonia 12/2020   PONV (postoperative nausea and vomiting)    Sleep apnea    uses cpap   Vertigo    Weakness of right side of body      Past Surgical History:  Procedure Laterality Date   ABDOMINAL HYSTERECTOMY  age 29   ANTERIOR CERVICAL DECOMP/DISCECTOMY FUSION N/A 02/24/2015   Procedure: CERVICAL FOUR-FIVE, CERVICAL FIVE-SIX, CERVICAL SIX-SEVEN ANTERIOR CERVICAL DECOMPRESSION/DISCECTOMY FUSION ;  Surgeon:  Lisbeth Renshaw, MD;  Location: [facility];  Service: Neurosurgery;  Laterality: N/A;  C45 C56 C67 anterior cervical decompression with fusion interbody prosthesis plating and bonegraft   APPENDECTOMY     BACK SURGERY  1988   4th lumbar fusion   BLADDER SURGERY N/A    with vaginal wall repair   BREAST CYST ASPIRATION Bilateral    neg   BREAST SURGERY Bilateral    cyst removed and reduction   CARDIAC CATHETERIZATION  2014   CARPAL TUNNEL RELEASE Right 02/11/2016   Procedure: CARPAL TUNNEL RELEASE;  Surgeon: Kennedy Bucker, MD;  Location: [facility];  Service: Orthopedics;  Laterality: Right;   CATARACT EXTRACTION W/ INTRAOCULAR LENS IMPLANT Bilateral 2015   CHOLECYSTECTOMY     EUS N/A 05/31/2012   Procedure: UPPER ENDOSCOPIC ULTRASOUND (EUS) LINEAR;  Surgeon: Rachael Fee, MD;  Location: [facility];  Service: Endoscopy;  Laterality: N/A;   EXCISIONAL HEMORRHOIDECTOMY     JOINT REPLACEMENT Bilateral    KNEE ARTHROSCOPY WITH LATERAL MENISECTOMY Right 07/07/2015   Procedure: KNEE ARTHROSCOPY WITH LATERAL MENISECTOMY, PARTIAL SYNOVECTOMY;  Surgeon: Kennedy Bucker, MD;  Location: [facility];  Service: Orthopedics;  Laterality: Right;   LUMBAR LAMINECTOMY     PORTA CATH INSERTION N/A 01/07/2019  Procedure: PORTA CATH INSERTION;  Surgeon: Annice Needy, MD;  Location: [facility];  Service: Cardiovascular;  Laterality: N/A;   REDUCTION MAMMAPLASTY  1990   RIGHT OOPHORECTOMY     TOTAL HIP ARTHROPLASTY Left 05/01/2014   Dr. Sheppard Evens   TOTAL HIP ARTHROPLASTY Right 08/05/2014   Procedure: TOTAL HIP ARTHROPLASTY ANTERIOR APPROACH;  Surgeon: Kennedy Bucker, MD;  Location: [facility];  Service: Orthopedics;  Laterality: Right;   ULNAR NERVE TRANSPOSITION Right 02/11/2016   Procedure: ULNAR NERVE DECOMPRESSION/TRANSPOSITION;  Surgeon: Kennedy Bucker, MD;  Location: [facility];  Service: Orthopedics;  Laterality: Right;   VIDEO BRONCHOSCOPY WITH ENDOBRONCHIAL ULTRASOUND Left 12/19/2018    Procedure: VIDEO BRONCHOSCOPY WITH ENDOBRONCHIAL ULTRASOUND, LEFT, SLEEP APNEA;  Surgeon: Vida Rigger, MD;  Location: [facility];  Service: Thoracic;  Laterality: Left;   VIDEO BRONCHOSCOPY WITH ENDOBRONCHIAL ULTRASOUND Right 05/14/2021   Procedure: VIDEO BRONCHOSCOPY WITH ENDOBRONCHIAL ULTRASOUND;  Surgeon: Salena Saner, MD;  Location: [facility];  Service: Pulmonary;  Laterality: Right;    Social History   Socioeconomic History   Marital status: Married    Spouse name: Not on file   Number of children: Not on file   Years of education: Not on file   Highest education level: Not on file  Occupational History   Not on file  Tobacco Use   Smoking status: Former    Current packs/day: 0.00    Average packs/day: 1 pack/day for 40.0 years (40.0 ttl pk-yrs)    Types: Cigarettes    Start date: 06/13/1967    Quit date: 06/13/2007    Years since quitting: 15.3   Smokeless tobacco: Never  Vaping Use   Vaping status: Never Used  Substance and Sexual Activity   Alcohol use: No    Alcohol/week: 0.0 standard drinks of alcohol   Drug use: No   Sexual activity: Not on file  Other Topics Concern   Not on file  Social History Narrative   Not on file   Social Determinants of Health   Financial Resource Strain: Not on file  Food Insecurity: No Food Insecurity (08/08/2022)   Hunger Vital Sign    Worried About Running Out of Food in the Last Year: Never true    Ran Out of Food in the Last Year: Never true  Transportation Needs: No Transportation Needs (08/08/2022)   PRAPARE - Administrator, Civil Service (Medical): No    Lack of Transportation (Non-Medical): No  Physical Activity: Not on file  Stress: Not on file  Social Connections: Not on file  Intimate Partner Violence: Not At Risk (08/08/2022)   Humiliation, Afraid, Rape, and Kick questionnaire    Fear of Current or Ex-Partner: No    Emotionally Abused: No    Physically Abused: No    Sexually Abused: No    Family  History  Problem Relation Age of Onset   Heart disease Mother        s/p stent   Hypertension Mother    Hypercholesterolemia Mother    Diabetes Father    Stomach cancer Other        uncle   Breast cancer Cousin    Breast cancer Paternal Aunt      Current Outpatient Medications:    acetaminophen (TYLENOL) 500 MG tablet, Take 500 mg by mouth 2 (two) times daily., Disp: , Rfl:    apixaban (ELIQUIS) 5 MG TABS tablet, Take 1 tablet (5 mg total) by mouth 2 (two) times daily., Disp: 60  tablet, Rfl: 0   Bioflavonoid Products (ESTER C PO), Take 1 tablet by mouth daily. 500 mg, Disp: , Rfl:    Calcium-Magnesium-Zinc (CAL-MAG-ZINC PO), Take 1 tablet by mouth daily., Disp: , Rfl:    cyclobenzaprine (FLEXERIL) 10 MG tablet, Take 10 mg by mouth at bedtime., Disp: , Rfl:    ezetimibe (ZETIA) 10 MG tablet, Take 5 mg by mouth in the morning and at bedtime., Disp: , Rfl:    glycerin adult 2 g suppository, Place 1 suppository rectally as needed for constipation., Disp: , Rfl:    Histamine Dihydrochloride (AUSTRALIAN DREAM ARTHRITIS) 0.025 % CREA, Apply 1 application  topically 4 (four) times daily as needed (pain)., Disp: , Rfl:    HYDROcodone-acetaminophen (NORCO/VICODIN) 5-325 MG tablet, Take 1 tablet by mouth at bedtime., Disp: 30 tablet, Rfl: 0   ipratropium-albuterol (DUONEB) 0.5-2.5 (3) MG/3ML SOLN, Take 3 mLs by nebulization every 6 (six) hours as needed., Disp: 360 mL, Rfl: 0   metoprolol tartrate (LOPRESSOR) 25 MG tablet, Take 0.5 tablets (12.5 mg total) by mouth 2 (two) times daily., Disp: 30 tablet, Rfl: 2   ondansetron (ZOFRAN) 8 MG tablet, Take 1 tablet (8 mg total) by mouth every 8 (eight) hours as needed for nausea or vomiting., Disp: 20 tablet, Rfl: 0   pantoprazole (PROTONIX) 40 MG tablet, Take 40 mg by mouth 2 (two) times daily., Disp: , Rfl:    Polyethyl Glyc-Propyl Glyc PF (SYSTANE HYDRATION PF) 0.4-0.3 % SOLN, Place 1 drop into both eyes as needed. as, Disp: , Rfl:    polyethylene  glycol powder (GLYCOLAX/MIRALAX) 17 GM/SCOOP powder, Take 17 g by mouth daily as needed for moderate constipation., Disp: , Rfl:    Sennosides (SENNA) 8.6 MG CAPS, Take 1 capsule by mouth daily as needed (constipation)., Disp: , Rfl:    spironolactone (ALDACTONE) 50 MG tablet, Hold until followup with outpatient provider due to acute kidney injury., Disp: , Rfl:    torsemide (DEMADEX) 20 MG tablet, Hold until followup with outpatient provider due to acute kidney injury., Disp: , Rfl:    triamcinolone (NASACORT) 55 MCG/ACT AERO nasal inhaler, Place 2 sprays into the nose daily as needed., Disp: , Rfl:    UBRELVY 100 MG TABS, Take 100 mg by mouth daily as needed (migraines)., Disp: , Rfl:    Wheat Dextrin (EQ FIBER POWDER PO), Take 1 Dose by mouth as needed., Disp: , Rfl:    loratadine (CLARITIN) 10 MG tablet, Take 10 mg by mouth daily. (Patient not taking: Reported on 09/19/2022), Disp: , Rfl:   Physical exam:  Vitals:   10/24/22 0835  BP: 114/71  Pulse: 99  Resp: 18  Temp: (!) 96.2 F (35.7 C)  TempSrc: Tympanic  SpO2: 100%  Weight: 157 lb 1.6 oz (71.3 kg)  Height: 5\' 1"  (1.549 m)   Physical Exam Constitutional:      Comments: On home oxygen 3 L  Cardiovascular:     Rate and Rhythm: Normal rate and regular rhythm.     Heart sounds: Normal heart sounds.  Pulmonary:     Effort: Pulmonary effort is normal.     Comments: Breath sounds decreased over right lung base Abdominal:     General: Bowel sounds are normal.     Palpations: Abdomen is soft.  Skin:    General: Skin is warm and dry.  Neurological:     Mental Status: She is alert and oriented to person, place, and time.         Latest  Ref Rng & Units 10/24/2022    8:24 AM  CMP  Glucose 70 - 99 mg/dL 82   BUN 8 - 23 mg/dL 13   Creatinine 1.47 - 1.00 mg/dL 8.29   Sodium 562 - 130 mmol/L 136   Potassium 3.5 - 5.1 mmol/L 3.5   Chloride 98 - 111 mmol/L 95   CO2 22 - 32 mmol/L 29   Calcium 8.9 - 10.3 mg/dL 9.2   Total  Protein 6.5 - 8.1 g/dL 6.9   Total Bilirubin 0.3 - 1.2 mg/dL 0.3   Alkaline Phos 38 - 126 U/L 77   AST 15 - 41 U/L 21   ALT 0 - 44 U/L 11       Latest Ref Rng & Units 10/24/2022    8:24 AM  CBC  WBC 4.0 - 10.5 K/uL 6.3   Hemoglobin 12.0 - 15.0 g/dL 86.5   Hematocrit 78.4 - 46.0 % 32.0   Platelets 150 - 400 K/uL 364     No images are attached to the encounter.  CT Angio Chest Pulmonary Embolism (PE) W or WO Contrast  Result Date: 10/20/2022 CLINICAL DATA:  Cough. EXAM: CT ANGIOGRAPHY CHEST WITH CONTRAST TECHNIQUE: Multidetector CT imaging of the chest was performed using the standard protocol during bolus administration of intravenous contrast. Multiplanar CT image reconstructions and MIPs were obtained to evaluate the vascular anatomy. RADIATION DOSE REDUCTION: This exam was performed according to the departmental dose-optimization program which includes automated exposure control, adjustment of the mA and/or kV according to patient size and/or use of iterative reconstruction technique. CONTRAST:  75mL OMNIPAQUE IOHEXOL 350 MG/ML SOLN COMPARISON:  Aug 07, 2022.  October 03, 2022. FINDINGS: Cardiovascular: Satisfactory opacification of the pulmonary arteries to the segmental level. No evidence of pulmonary embolism. Normal heart size. Small pericardial effusion. Mediastinum/Nodes: Thyroid gland is unremarkable. Post radiation changes are noted in left hilar region. Esophagus is unremarkable. No definite adenopathy is noted. Lungs/Pleura: Emphysematous disease is noted. Small left pleural effusion is noted. No pneumothorax is noted. Stable post treatment change is noted in right lower lobe. Upper Abdomen: No acute abnormality. Musculoskeletal: No chest wall abnormality. No acute or significant osseous findings. Review of the MIP images confirms the above findings. IMPRESSION: No definite evidence of pulmonary embolus. Stable post radiation changes are noted in left hilar and right basilar regions. Small  pericardial effusion. Small left pleural effusion. Aortic Atherosclerosis (ICD10-I70.0) and Emphysema (ICD10-J43.9). Electronically Signed   By: Lupita Raider M.D.   On: 10/20/2022 15:10   NM PET Image Restag (PS) Skull Base To Thigh  Result Date: 10/10/2022 CLINICAL DATA:  Subsequent treatment strategy for Lung cancer. EXAM: NUCLEAR MEDICINE PET SKULL BASE TO THIGH TECHNIQUE: 8.99 mCi F-18 FDG was injected intravenously. Full-ring PET imaging was performed from the skull base to thigh after the radiotracer. CT data was obtained and used for attenuation correction and anatomic localization. Fasting blood glucose: 81 mg/dl COMPARISON:  PET-CT 69/62/9528 FINDINGS: Mediastinal blood pool activity: SUV max 2.87 Liver activity: SUV max NA NECK: No hypermetabolic lymph nodes in the neck. Incidental CT findings: None. CHEST: Previous tracer avid high right paratracheal lymph node has resolved in the interval. Index lower right paratracheal lymph node measures 4 mm with SUV max 4.35, image 46/4. Previously 5 mm with SUV max of 5.56. Previous tracer uptake within the left hilum has an SUV max of 4.11 on today's study. Previously 3.94. Tracer avid nodule within the superior segment of right lower lobe measures 1  cm with SUV max of 5.89, image 58/4. Formally this measured 9 mm with SUV max of 6.4. Index medial right lower lobe lung nodule measures 6 mm and has an SUV max 4.06, image 63/4. Previously this measured 9 mm with SUV max of 8.0. Incidental CT findings: Significant reduction in volume of previous right pleural effusion, now small in volume. Paramediastinal and perihilar radiation change is again identified within both lungs. Aortic atherosclerosis and coronary artery calcifications. Small pericardial effusion. ABDOMEN/PELVIS: No abnormal hypermetabolic activity within the liver, pancreas, adrenal glands, or spleen. No hypermetabolic lymph nodes in the abdomen or pelvis. Incidental CT findings: Aortic  atherosclerosis. Small stone noted within inferior pole of right kidney. SKELETON: No focal hypermetabolic activity to suggest skeletal metastasis. Incidental CT findings: Status post bilateral hip arthroplasty. IMPRESSION: 1. Interval resolution of previous tracer avid high right paratracheal lymph node. Similar degree of tracer uptake associated with the left hilum consistent with nodal metastasis. 2. Nodule within the superior segment of right lower is unchanged in size and degree of tracer uptake. Decreased tracer uptake and size associated with medial right lower lobe lung nodule. 3. Interval decrease in size of right pleural effusion, now small in volume. 4. No new sites of disease identified. 5. Aortic Atherosclerosis (ICD10-I70.0). Electronically Signed   By: Signa Kell M.D.   On: 10/10/2022 09:39     Assessment and plan- Patient is a 77 y.o. female with history of locally recurrent non-small cell lung cancer adenocarcinoma.  She is here for on treatment assessment prior to cycle 7 of maintenance Alimta  Last PET scan on 10/03/2022 showed no evidence of progressive disease.  There was resolution of uptake in the right paratracheal lymph node.  Plan was to continue Alimta until progression or toxicity.  Her hemoglobin has remained stable around 10 on Alimta.  Renal functions are within normal limits.  Counts otherwise okay to proceed with Alimta today and I will see her back in 3 weeks for cycle 8.  She will also get her B12 injections today.  Patient has chronic hypoxic respiratory failure which is multifactorial secondary to underlying COPD, postradiation as well as underlying lung cancer.  Her quality of life is more limited because of her chronic respiratory failure rather than underlying lung cancer at this point.   Visit Diagnosis 1. Encounter for antineoplastic chemotherapy   2. Malignant neoplasm of upper lobe of left lung (HCC)   3. Chronic hypoxic respiratory failure (HCC)       Dr. Owens Shark, MD, MPH [facility] at [facility] [phone] 10/25/2022 9:12 AM

## 2022-10-31 ENCOUNTER — Other Ambulatory Visit: Payer: Medicare Other

## 2022-10-31 ENCOUNTER — Ambulatory Visit: Payer: Medicare Other

## 2022-10-31 ENCOUNTER — Ambulatory Visit: Payer: Medicare Other | Admitting: Oncology

## 2022-11-03 ENCOUNTER — Other Ambulatory Visit: Payer: Self-pay

## 2022-11-03 ENCOUNTER — Encounter: Payer: Self-pay | Admitting: Emergency Medicine

## 2022-11-03 ENCOUNTER — Emergency Department: Payer: Medicare Other

## 2022-11-03 ENCOUNTER — Emergency Department
Admission: EM | Admit: 2022-11-03 | Discharge: 2022-11-03 | Disposition: A | Discharge status: Home / Self Care | Payer: Medicare Other | Attending: Emergency Medicine | Admitting: Emergency Medicine

## 2022-11-03 DIAGNOSIS — S7002XA Contusion of left hip, initial encounter: Secondary | ICD-10-CM | POA: Diagnosis not present

## 2022-11-03 DIAGNOSIS — S0990XA Unspecified injury of head, initial encounter: Secondary | ICD-10-CM | POA: Insufficient documentation

## 2022-11-03 DIAGNOSIS — J449 Chronic obstructive pulmonary disease, unspecified: Secondary | ICD-10-CM | POA: Diagnosis not present

## 2022-11-03 DIAGNOSIS — R519 Headache, unspecified: Secondary | ICD-10-CM | POA: Diagnosis present

## 2022-11-03 DIAGNOSIS — I10 Essential (primary) hypertension: Secondary | ICD-10-CM | POA: Diagnosis not present

## 2022-11-03 DIAGNOSIS — W010XXA Fall on same level from slipping, tripping and stumbling without subsequent striking against object, initial encounter: Secondary | ICD-10-CM | POA: Diagnosis not present

## 2022-11-03 DIAGNOSIS — S8002XA Contusion of left knee, initial encounter: Secondary | ICD-10-CM | POA: Insufficient documentation

## 2022-11-03 DIAGNOSIS — W19XXXA Unspecified fall, initial encounter: Secondary | ICD-10-CM

## 2022-11-03 NOTE — ED Triage Notes (Signed)
Pt to ED via [facility] from home. Pt was walking out back door and slipped on rug. Pt fell forward hitting the right side of her head and landing on her left hip. Pt reports right sided head pain, left hip pain and left knee pain. Per EMS pt with hx bilateral hip replacements. Pt A&Ox4. Pt unsure if LOC.

## 2022-11-03 NOTE — Discharge Instructions (Signed)
Your CT scans and x-rays did not show any acute injuries.  Please follow-up with your outpatient provider.  Please return for any new, worsening, or change in symptoms or other concerns.  It was a pleasure caring for you today.

## 2022-11-03 NOTE — ED Provider Notes (Signed)
[facility] Provider Note    Event Date/Time   First MD Initiated Contact with Patient 11/03/22 [phone]     (approximate)   History   No chief complaint on file.   HPI  Jennifer Hodges is a 77 y.o. female with a past medical history of anxiety, SVT, adenocarcinoma of the lung, hypertension, osteoporosis who presents today for evaluation after a fall.  Patient reports that she stepped out onto her back porch and slipped on a rug.  She reports that she fell backwards and hit her head and landed on her left hip.  She was able to crawl herself back into her house and called an ambulance.  She denies loss of consciousness.  She has not attempted to weight-bear.  She feels nauseated but has not had any vomiting.  No paresthesias.  No chest pain or shortness of breath.  No abdominal pain, vomiting, or diarrhea.  No visual changes.  She takes Eliquis.  Patient Active Problem List   Diagnosis Date Noted   SVT (supraventricular tachycardia) 08/07/2022   Pericardial effusion 08/07/2022   Generalized weakness 08/07/2022   AKI (acute kidney injury) (HCC) 08/07/2022   Electrolyte abnormality 08/07/2022   Chronic hypoxic respiratory failure (HCC) 08/07/2022   Nausea without vomiting 07/18/2022   Pneumonia 04/05/2022   COPD with acute exacerbation (HCC) 04/05/2022   Palliative care encounter 02/08/2022   Pleural effusion on right 02/05/2022   Lobar pneumonia (HCC) 02/04/2022   Aspiration pneumonia (HCC) 02/04/2022   Bilateral leg edema 01/20/2022   Edema of lower extremity 06/16/2021   Chronic anxiety 06/16/2021   Cervical post-laminectomy syndrome 06/16/2021   Adenocarcinoma of lung (HCC) 06/16/2021   Anemia 05/26/2021   Postobstructive pneumonia 01/06/2021   Benign breast lumps 01/11/2019   Gout 01/11/2019   Hives 01/11/2019   Lung disease 01/11/2019   Migraine headache 01/11/2019   Goals of care, counseling/discussion 12/27/2018   Malignant neoplasm of upper lobe  of left lung (HCC) 12/27/2018   Night sweats 08/07/2016   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   Osteopenia of multiple sites 04/27/2016   Left carpal tunnel syndrome 02/02/2016   Carotid stenosis, asymptomatic, bilateral 12/29/2015   Prediabetes 09/28/2015   Primary osteoarthritis involving multiple joints 09/28/2015   Knee pain 05/11/2015   Chest tightness 03/24/2015   Stool incontinence 03/24/2015   Urinary frequency 03/04/2015   Cervical spondylosis with radiculopathy 02/24/2015   Pre-syncope 02/19/2015   URI (upper respiratory infection) 02/15/2015   Neck pain 12/25/2014   Pelvic pain in female 11/16/2014   Primary osteoarthritis of hip 08/05/2014   Heel pain 06/01/2014   Diarrhea 06/01/2014   Health care maintenance 06/01/2014   Right leg pain 03/30/2014   Right arm pain 03/30/2014   Sinusitis 03/30/2014   Internal nasal lesion 11/12/2013   Other specified disorders of nose and nasal sinuses 11/12/2013   Chronic tension-type headache, intractable 10/30/2013   Intractable migraine without aura and without status migrainosus 10/30/2013   Occipital neuralgia of left side 10/30/2013   IBS (irritable bowel syndrome) 09/03/2013   Rib pain 08/04/2013   Cervical radiculitis 08/01/2013   Lumbar radiculitis 08/01/2013   Palpitations 07/25/2013   Benign essential hypertension 05/28/2013   Neuralgia, neuritis, and radiculitis, unspecified 05/28/2013   Vitamin D deficiency 05/28/2013   Elevated AFP 04/28/2013   Abnormality of alpha-fetoprotein 04/28/2013   Osteoporosis 11/12/2012   Incomplete emptying of bladder 07/11/2012   Prolapse of vaginal vault after hysterectomy 07/11/2012   Nonspecific (abnormal) findings on radiological  and other examination of gastrointestinal tract 05/31/2012   Bronchiectasis (HCC) 02/05/2012   Sleep apnea 02/05/2012   Degenerative disc disease, cervical 02/05/2012   Hypercholesterolemia 02/05/2012   GERD (gastroesophageal reflux disease)  02/05/2012   Chronic headaches 02/05/2012          Physical Exam   Triage Vital Signs: ED Triage Vitals [11/03/22 0909]  Encounter Vitals Group     BP (!) 142/86     Systolic BP Percentile      Diastolic BP Percentile      Pulse Rate 90     Resp 20     Temp 98 F (36.7 C)     Temp Source Oral     SpO2 98 %     Weight 157 lb 1.6 oz (71.3 kg)     Height 5\' 1"  (1.549 m)     Head Circumference      Peak Flow      Pain Score 6     Pain Loc      Pain Education      Exclude from Growth Chart     Most recent vital signs: Vitals:   11/03/22 0909  BP: (!) 142/86  Pulse: 90  Resp: 20  Temp: 98 F (36.7 C)  SpO2: 98%    Physical Exam Vitals and nursing note reviewed.  Constitutional:      General: Awake and alert. No acute distress.    Appearance: Normal appearance. The patient is overweight.  HENT:     Head: Normocephalic and atraumatic.     Mouth: Mucous membranes are moist.  Eyes:     General: PERRL. Normal EOMs        Right eye: No discharge.        Left eye: No discharge.     Conjunctiva/sclera: Conjunctivae normal.  Cardiovascular:     Rate and Rhythm: Normal rate     Pulses: Normal pulses.  Pulmonary:     Effort: Pulmonary effort is normal. No respiratory distress.     Breath sounds: Normal breath sounds.  Abdominal:     Abdomen is soft. There is no abdominal tenderness. No rebound or guarding. No distention. Musculoskeletal:        General: No swelling. Normal range of motion.     Cervical back: Normal range of motion and neck supple.  No midline cervical spine tenderness.  Full range of motion of neck.  Negative Spurling test.  Negative Lhermitte sign.  Normal strength and sensation in bilateral upper extremities. Normal grip strength bilaterally.  Normal intrinsic muscle function of the hand bilaterally.  Normal radial pulses bilaterally. Pelvis stable.  Able to lift bilateral legs up off of the stretcher.  Negative logroll bilaterally.  Full and  normal range of motion of bilateral upper extremities. Skin:    General: Skin is warm and dry.     Capillary Refill: Capillary refill takes less than 2 seconds.     Findings: No rash.  Neurological:     Mental Status: The patient is awake and alert.   Neurological: GCS 15 alert and oriented x3 Normal speech, no expressive or receptive aphasia or dysarthria Cranial nerves II through XII intact Normal visual fields 5 out of 5 strength in all 4 extremities with intact sensation throughout No extremity drift Normal finger-to-nose testing, no limb or truncal ataxia    ED Results / Procedures / Treatments   Labs (all labs ordered are listed, but only abnormal results are displayed) Labs Reviewed - No  data to display   EKG     RADIOLOGY I independently reviewed and interpreted imaging and agree with radiologists findings.     PROCEDURES:  Critical Care performed:   Procedures   MEDICATIONS ORDERED IN ED: Medications - No data to display   IMPRESSION / MDM / ASSESSMENT AND PLAN / ED COURSE  I reviewed the triage vital signs and the nursing notes.   Differential diagnosis includes, but is not limited to, fracture, dislocation, contusion, intracranial hemorrhage, cervical spine injury.  Patient is awake and alert, hemodynamically stable and neurologically and neurovascularly intact.  She is able to move all extremities easily.  She has no focal neurological deficits.  She has no cervical spine tenderness.  She has normal strength and sensation of bilateral extremities, normal grip strength, do not suspect central cord syndrome.  CT head and neck obtained per [place] criteria are negative for any acute findings.  X-rays of her hip, knee, hand were obtained in triage are also negative for any acute traumatic findings.  She does have known degenerative changes.  No evidence of hematoma anywhere on exam.  She declined analgesia in the emergency department.  We discussed her  findings and she and her husband are reassured.  They feel ready for discharge home.  We discussed return precautions and outpatient follow-up.  Patient or stands and agrees with plan.  She was discharged in stable condition with her husband.  She is able to ambulate.   Patient's presentation is most consistent with acute complicated illness / injury requiring diagnostic workup.    FINAL CLINICAL IMPRESSION(S) / ED DIAGNOSES   Final diagnoses:  Fall, initial encounter  Contusion of left hip, initial encounter  Contusion of left knee, initial encounter  Injury of head, initial encounter     Rx / DC Orders   ED Discharge Orders     None        Note:  This document was prepared using Dragon voice recognition software and may include unintentional dictation errors.   Keturah Shavers 11/03/22 1433    Chesley Noon, MD 11/03/22 1455

## 2022-11-15 ENCOUNTER — Inpatient Hospital Stay (HOSPITAL_BASED_OUTPATIENT_CLINIC_OR_DEPARTMENT_OTHER): Payer: Medicare Other | Admitting: Oncology

## 2022-11-15 ENCOUNTER — Inpatient Hospital Stay: Payer: Medicare Other | Attending: Oncology

## 2022-11-15 ENCOUNTER — Encounter: Payer: Self-pay | Admitting: Oncology

## 2022-11-15 ENCOUNTER — Inpatient Hospital Stay: Payer: Medicare Other

## 2022-11-15 VITALS — BP 129/67 | HR 83

## 2022-11-15 VITALS — BP 123/86 | HR 86 | Temp 96.0°F | Resp 20 | Ht 61.0 in | Wt 159.2 lb

## 2022-11-15 DIAGNOSIS — D6481 Anemia due to antineoplastic chemotherapy: Secondary | ICD-10-CM

## 2022-11-15 DIAGNOSIS — Z5111 Encounter for antineoplastic chemotherapy: Secondary | ICD-10-CM | POA: Diagnosis not present

## 2022-11-15 DIAGNOSIS — C3412 Malignant neoplasm of upper lobe, left bronchus or lung: Secondary | ICD-10-CM

## 2022-11-15 DIAGNOSIS — E876 Hypokalemia: Secondary | ICD-10-CM | POA: Insufficient documentation

## 2022-11-15 DIAGNOSIS — T451X5A Adverse effect of antineoplastic and immunosuppressive drugs, initial encounter: Secondary | ICD-10-CM | POA: Diagnosis not present

## 2022-11-15 DIAGNOSIS — D649 Anemia, unspecified: Secondary | ICD-10-CM

## 2022-11-15 DIAGNOSIS — R110 Nausea: Secondary | ICD-10-CM

## 2022-11-15 LAB — CBC WITH DIFFERENTIAL/PLATELET
Abs Immature Granulocytes: 0.03 10*3/uL (ref 0.00–0.07)
Basophils Absolute: 0 10*3/uL (ref 0.0–0.1)
Basophils Relative: 0 %
Eosinophils Absolute: 0.1 10*3/uL (ref 0.0–0.5)
Eosinophils Relative: 2 %
HCT: 29.3 % — ABNORMAL LOW (ref 36.0–46.0)
Hemoglobin: 9.1 g/dL — ABNORMAL LOW (ref 12.0–15.0)
Immature Granulocytes: 1 %
Lymphocytes Relative: 10 %
Lymphs Abs: 0.5 10*3/uL — ABNORMAL LOW (ref 0.7–4.0)
MCH: 29.2 pg (ref 26.0–34.0)
MCHC: 31.1 g/dL (ref 30.0–36.0)
MCV: 93.9 fL (ref 80.0–100.0)
Monocytes Absolute: 0.6 10*3/uL (ref 0.1–1.0)
Monocytes Relative: 12 %
Neutro Abs: 3.9 10*3/uL (ref 1.7–7.7)
Neutrophils Relative %: 75 %
Platelets: 540 10*3/uL — ABNORMAL HIGH (ref 150–400)
RBC: 3.12 MIL/uL — ABNORMAL LOW (ref 3.87–5.11)
RDW: 18 % — ABNORMAL HIGH (ref 11.5–15.5)
WBC: 5.2 10*3/uL (ref 4.0–10.5)
nRBC: 0 % (ref 0.0–0.2)

## 2022-11-15 LAB — COMPREHENSIVE METABOLIC PANEL
ALT: 19 U/L (ref 0–44)
AST: 24 U/L (ref 15–41)
Albumin: 3.6 g/dL (ref 3.5–5.0)
Alkaline Phosphatase: 82 U/L (ref 38–126)
Anion gap: 11 (ref 5–15)
BUN: 11 mg/dL (ref 8–23)
CO2: 30 mmol/L (ref 22–32)
Calcium: 9.1 mg/dL (ref 8.9–10.3)
Chloride: 94 mmol/L — ABNORMAL LOW (ref 98–111)
Creatinine, Ser: 0.73 mg/dL (ref 0.44–1.00)
GFR, Estimated: >60 mL/min (ref >60)
Glucose, Bld: 103 mg/dL — ABNORMAL HIGH (ref 70–99)
Potassium: 3.7 mmol/L (ref 3.5–5.1)
Sodium: 135 mmol/L (ref 135–145)
Total Bilirubin: 0.6 mg/dL (ref 0.3–1.2)
Total Protein: 6.7 g/dL (ref 6.5–8.1)

## 2022-11-15 MED ORDER — SODIUM CHLORIDE 0.9% FLUSH
10.0000 mL | INTRAVENOUS | Status: DC | PRN
  Administered 2022-11-15: 10 mL via INTRAVENOUS
  Filled 2022-11-15: qty 10

## 2022-11-15 MED ORDER — SODIUM CHLORIDE 0.9 % IV SOLN
375.0000 mg/m2 | Freq: Once | INTRAVENOUS | Status: AC
  Administered 2022-11-15: 700 mg via INTRAVENOUS
  Filled 2022-11-15: qty 20

## 2022-11-15 MED ORDER — SODIUM CHLORIDE 0.9 % IV SOLN
Freq: Once | INTRAVENOUS | Status: AC
  Filled 2022-11-15: qty 250

## 2022-11-15 MED ORDER — HEPARIN SOD (PORK) LOCK FLUSH 100 UNIT/ML IV SOLN
500.0000 [IU] | Freq: Once | INTRAVENOUS | Status: AC | PRN
  Administered 2022-11-15: 500 [IU]
  Filled 2022-11-15: qty 5

## 2022-11-15 MED ORDER — CYANOCOBALAMIN 1000 MCG/ML IJ SOLN
1000.0000 ug | Freq: Once | INTRAMUSCULAR | Status: AC
  Administered 2022-11-15: 1000 ug via INTRAMUSCULAR
  Filled 2022-11-15: qty 1

## 2022-11-15 MED ORDER — SODIUM CHLORIDE 0.9 % IV SOLN
10.0000 mg | Freq: Once | INTRAVENOUS | Status: AC
  Administered 2022-11-15: 10 mg via INTRAVENOUS
  Filled 2022-11-15: qty 10

## 2022-11-15 NOTE — Progress Notes (Signed)
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  EGFR and Ros as well as MET testing negative. PD-L1 was 50%   Patient completed concurrent chemoradiation with weekly carbotaxol chemotherapy on 03/12/2019.  Maintenance durvalumab started in January 2021 after scan showed partial response.  Patient completed 1 year of maintenance durvalumab in February 2022   Patient was found to have enlarging mediastinal adenopathyOn subsequent CT scan in January 2023.  This was followed by a PET scan which showed hypermetabolic mediastinal and right hilar lymph nodes but no other evidence of disease elsewhere.  This was biopsied and was consistent with non-small cell lung cancer specifically adenocarcinoma.  Cells were positive for TTF-1 and Napsin A and negative for p40.   Patient completed concurrent chemoradiation with 4 cycles of carbo Alimta ending in May 2023.  Scan showed partial response.  Plan is for maintenance Alimta until progression or toxicity.  Alimta was stopped in November 2023 due to progressive anemia and stable scans   Patient was hospitalized in November  2023 for acute on chronic hypoxic respiratory failure secondary to healthcare associated pneumonia.  She had pleural effusions which were tapped and thoracentesis was negative for malignancy.   Scans in March 2024 showed concern for progressive disease with appearance of new right lung nodules as well as progressive mediastinal adenopathy.  Also noted to have right pleural effusion which was tapped and was negative for malignancy.  Single agent Alimta was restarted      Interval history-patient had a fall at home about 10 days ago when she tripped on a rug and had to go to the ER.  She had extensive bruising over her left ankle but imaging studies did not show any evidence of fracture.  She is gradually recovering from this.  She has baseline fatigue and exertional shortness of breath.  She was recently prescribed a new inhaler by Dr. Karna Christmas  ECOG PS- 2 Pain scale- 0   Review of systems- Review of Systems  Constitutional:  Positive for malaise/fatigue.  Respiratory:  Positive for shortness of breath.   Musculoskeletal:  Positive for joint pain.      Allergies  Allergen Reactions   Aspirin Hives, Shortness Of Breath and Other (See Comments)    Difficulty breathing   Celebrex [Celecoxib] Shortness Of Breath   Morphine And Codeine Nausea And Vomiting and Swelling   Adhesive [Tape] Other (See Comments)    Took top layer of skin off when removed.   Clarithromycin Nausea And Vomiting   Codeine Nausea And Vomiting   Darvon [Propoxyphene Hcl] Nausea And Vomiting   Demerol [Meperidine] Nausea And Vomiting   Flonase [Fluticasone Propionate] Other (See Comments)    Fungal infection in nose  Simvastatin Other (See Comments)    "caused ulcers in mouth, and fever"   Talwin [Pentazocine] Nausea And Vomiting     Past Medical History:  Diagnosis Date   Anemia    Asthma    No Inhalers--Dr. Meredeth Ide will order as needed   Bronchiectasis (HCC)    mild   Chronic headaches     followed by  [facility] migraines   COPD (chronic obstructive pulmonary disease) (HCC)    DDD (degenerative disc disease), lumbar    Diverticulosis    Family history of adverse reaction to anesthesia    mother and sisters-PONV   Gall stones    history of   GERD (gastroesophageal reflux disease)    EGD 8/09- non bleeding erosive gastritis, documentd esophageal ulcerations.    Hiatal hernia    small   History of kidney stones    History of pneumonia    Hypercholesterolemia    IBS (irritable bowel syndrome)    Malignant neoplasm of upper lobe of left lung (HCC) 12/27/2018   Meniere disease    Murmur    Osteoarthritis    lumbar disc disease, left hip   Personal history of chemotherapy    Personal history of radiation therapy    Pneumonia 12/2020   PONV (postoperative nausea and vomiting)    Sleep apnea    uses cpap   Vertigo    Weakness of right side of body      Past Surgical History:  Procedure Laterality Date   ABDOMINAL HYSTERECTOMY  age 46   ANTERIOR CERVICAL DECOMP/DISCECTOMY FUSION N/A 02/24/2015   Procedure: CERVICAL FOUR-FIVE, CERVICAL FIVE-SIX, CERVICAL SIX-SEVEN ANTERIOR CERVICAL DECOMPRESSION/DISCECTOMY FUSION ;  Surgeon: Lisbeth Renshaw, MD;  Location: [facility];  Service: Neurosurgery;  Laterality: N/A;  C45 C56 C67 anterior cervical decompression with fusion interbody prosthesis plating and bonegraft   APPENDECTOMY     BACK SURGERY  1988   4th lumbar fusion   BLADDER SURGERY N/A    with vaginal wall repair   BREAST CYST ASPIRATION Bilateral    neg   BREAST SURGERY Bilateral    cyst removed and reduction   CARDIAC CATHETERIZATION  2014   CARPAL TUNNEL RELEASE Right 02/11/2016   Procedure: CARPAL TUNNEL RELEASE;  Surgeon: Kennedy Bucker, MD;  Location: [facility] ORS;  Service: Orthopedics;  Laterality: Right;   CATARACT EXTRACTION W/ INTRAOCULAR LENS IMPLANT Bilateral 2015   CHOLECYSTECTOMY     EUS N/A 05/31/2012   Procedure: UPPER ENDOSCOPIC ULTRASOUND (EUS) LINEAR;   Surgeon: Rachael Fee, MD;  Location: [facility];  Service: Endoscopy;  Laterality: N/A;   EXCISIONAL HEMORRHOIDECTOMY     JOINT REPLACEMENT Bilateral    KNEE ARTHROSCOPY WITH LATERAL MENISECTOMY Right 07/07/2015   Procedure: KNEE ARTHROSCOPY WITH LATERAL MENISECTOMY, PARTIAL SYNOVECTOMY;  Surgeon: Kennedy Bucker, MD;  Location: [facility];  Service: Orthopedics;  Laterality: Right;   LUMBAR LAMINECTOMY     PORTA CATH INSERTION N/A 01/07/2019   Procedure: PORTA CATH INSERTION;  Surgeon: Annice Needy, MD;  Location: [facility];  Service: Cardiovascular;  Laterality: N/A;   REDUCTION MAMMAPLASTY  1990   RIGHT OOPHORECTOMY     TOTAL HIP ARTHROPLASTY Left 05/01/2014   Dr. Sheppard Evens   TOTAL HIP ARTHROPLASTY Right 08/05/2014   Procedure: TOTAL HIP ARTHROPLASTY ANTERIOR APPROACH;  Surgeon: Kennedy Bucker, MD;  Location: [facility];  Service: Orthopedics;  Laterality: Right;   ULNAR NERVE TRANSPOSITION Right 02/11/2016   Procedure: ULNAR NERVE DECOMPRESSION/TRANSPOSITION;  Surgeon: Kennedy Bucker, MD;  Location: [facility];  Service: Orthopedics;  Laterality: Right;   VIDEO BRONCHOSCOPY WITH ENDOBRONCHIAL ULTRASOUND Left 12/19/2018   Procedure: VIDEO BRONCHOSCOPY WITH ENDOBRONCHIAL ULTRASOUND, LEFT, SLEEP APNEA;  Surgeon: Vida Rigger, MD;  Location: [facility];  Service: Thoracic;  Laterality: Left;   VIDEO BRONCHOSCOPY WITH ENDOBRONCHIAL ULTRASOUND Right 05/14/2021   Procedure: VIDEO BRONCHOSCOPY WITH ENDOBRONCHIAL ULTRASOUND;  Surgeon: Salena Saner, MD;  Location: [facility];  Service: Pulmonary;  Laterality: Right;    Social History   Socioeconomic History   Marital status: Married    Spouse name: Not on file   Number of children: Not on file   Years of education: Not on file   Highest education level: Not on file  Occupational History   Not on file  Tobacco Use   Smoking status: Former    Current packs/day: 0.00    Average packs/day: 1 pack/day for 40.0 years (40.0 ttl  pk-yrs)    Types: Cigarettes    Start date: 06/13/1967    Quit date: 06/13/2007    Years since quitting: 15.4   Smokeless tobacco: Never  Vaping Use   Vaping status: Never Used  Substance and Sexual Activity   Alcohol use: No    Alcohol/week: 0.0 standard drinks of alcohol   Drug use: No   Sexual activity: Yes  Other Topics Concern   Not on file  Social History Narrative   Not on file   Social Determinants of Health   Financial Resource Strain: Not on file  Food Insecurity: No Food Insecurity (08/08/2022)   Hunger Vital Sign    Worried About Running Out of Food in the Last Year: Never true    Ran Out of Food in the Last Year: Never true  Transportation Needs: No Transportation Needs (08/08/2022)   PRAPARE - Administrator, Civil Service (Medical): No    Lack of Transportation (Non-Medical): No  Physical Activity: Not on file  Stress: Not on file  Social Connections: Not on file  Intimate Partner Violence: Not At Risk (08/08/2022)   Humiliation, Afraid, Rape, and Kick questionnaire    Fear of Current or Ex-Partner: No    Emotionally Abused: No    Physically Abused: No    Sexually Abused: No    Family History  Problem Relation Age of Onset   Heart disease Mother        s/p stent   Hypertension Mother    Hypercholesterolemia Mother    Diabetes Father    Stomach cancer Other        uncle   Breast cancer Cousin    Breast cancer Paternal Aunt      Current Outpatient Medications:    acetaminophen (TYLENOL) 500 MG tablet, Take 500 mg by mouth 2 (two) times daily., Disp: , Rfl:    apixaban (ELIQUIS) 5 MG TABS tablet, Take 1 tablet (5 mg total) by mouth 2 (two) times daily., Disp: 60 tablet, Rfl: 0   Bioflavonoid Products (ESTER C PO), Take 1 tablet by mouth daily. 500 mg, Disp: , Rfl:    Calcium-Magnesium-Zinc (CAL-MAG-ZINC PO), Take 1 tablet by mouth daily., Disp: , Rfl:    cyclobenzaprine (FLEXERIL) 10 MG tablet, Take 10 mg by mouth at bedtime., Disp: , Rfl:     ezetimibe (ZETIA) 10 MG tablet, Take 5 mg by mouth in the morning and at bedtime., Disp: , Rfl:    glycerin adult 2 g suppository, Place 1 suppository rectally as needed for constipation., Disp: , Rfl:  Histamine Dihydrochloride (AUSTRALIAN DREAM ARTHRITIS) 0.025 % CREA, Apply 1 application  topically 4 (four) times daily as needed (pain)., Disp: , Rfl:    HYDROcodone-acetaminophen (NORCO/VICODIN) 5-325 MG tablet, Take 1 tablet by mouth at bedtime., Disp: 30 tablet, Rfl: 0   ipratropium-albuterol (DUONEB) 0.5-2.5 (3) MG/3ML SOLN, Take 3 mLs by nebulization every 6 (six) hours as needed., Disp: 360 mL, Rfl: 0   loratadine (CLARITIN) 10 MG tablet, Take 10 mg by mouth daily., Disp: , Rfl:    metoprolol tartrate (LOPRESSOR) 25 MG tablet, Take 0.5 tablets (12.5 mg total) by mouth 2 (two) times daily., Disp: 30 tablet, Rfl: 2   ondansetron (ZOFRAN) 8 MG tablet, Take 1 tablet (8 mg total) by mouth every 8 (eight) hours as needed for nausea or vomiting., Disp: 20 tablet, Rfl: 0   pantoprazole (PROTONIX) 40 MG tablet, Take 40 mg by mouth 2 (two) times daily., Disp: , Rfl:    Polyethyl Glyc-Propyl Glyc PF (SYSTANE HYDRATION PF) 0.4-0.3 % SOLN, Place 1 drop into both eyes as needed. as, Disp: , Rfl:    polyethylene glycol powder (GLYCOLAX/MIRALAX) 17 GM/SCOOP powder, Take 17 g by mouth daily as needed for moderate constipation., Disp: , Rfl:    Sennosides (SENNA) 8.6 MG CAPS, Take 1 capsule by mouth daily as needed (constipation)., Disp: , Rfl:    spironolactone (ALDACTONE) 50 MG tablet, Hold until followup with outpatient provider due to acute kidney injury., Disp: , Rfl:    torsemide (DEMADEX) 20 MG tablet, Hold until followup with outpatient provider due to acute kidney injury., Disp: , Rfl:    triamcinolone (NASACORT) 55 MCG/ACT AERO nasal inhaler, Place 2 sprays into the nose daily as needed., Disp: , Rfl:    UBRELVY 100 MG TABS, Take 100 mg by mouth daily as needed (migraines)., Disp: , Rfl:    Wheat  Dextrin (EQ FIBER POWDER PO), Take 1 Dose by mouth as needed., Disp: , Rfl:  No current facility-administered medications for this visit.  Facility-Administered Medications Ordered in Other Visits:    sodium chloride flush (NS) 0.9 % injection 10 mL, 10 mL, Intravenous, PRN, Creig Hines, MD, 10 mL at 11/15/22 0835  Physical exam:  Vitals:   11/15/22 0900  BP: 123/86  Pulse: 86  Resp: 20  Temp: (!) 96 F (35.6 C)  TempSrc: Tympanic  SpO2: 100%  Weight: 159 lb 3.2 oz (72.2 kg)  Height: 5\' 1"  (1.549 m)   Physical Exam Constitutional:      Comments: She is on home oxygen  Cardiovascular:     Rate and Rhythm: Normal rate and regular rhythm.     Heart sounds: Normal heart sounds.  Pulmonary:     Effort: Pulmonary effort is normal.     Comments: Mild scattered bilateral rhonchi Abdominal:     General: Bowel sounds are normal.     Palpations: Abdomen is soft.  Musculoskeletal:     Comments: Bruising note around right ankle  Skin:    General: Skin is warm and dry.  Neurological:     Mental Status: She is alert and oriented to person, place, and time.         Latest Ref Rng & Units 11/15/2022    8:35 AM  CMP  Glucose 70 - 99 mg/dL 960   BUN 8 - 23 mg/dL 11   Creatinine 4.54 - 1.00 mg/dL 0.98   Sodium 119 - 147 mmol/L 135   Potassium 3.5 - 5.1 mmol/L 3.7   Chloride 98 -  111 mmol/L 94   CO2 22 - 32 mmol/L 30   Calcium 8.9 - 10.3 mg/dL 9.1   Total Protein 6.5 - 8.1 g/dL 6.7   Total Bilirubin 0.3 - 1.2 mg/dL 0.6   Alkaline Phos 38 - 126 U/L 82   AST 15 - 41 U/L 24   ALT 0 - 44 U/L 19       Latest Ref Rng & Units 11/15/2022    8:35 AM  CBC  WBC 4.0 - 10.5 K/uL 5.2   Hemoglobin 12.0 - 15.0 g/dL 9.1   Hematocrit 16.1 - 46.0 % 29.3   Platelets 150 - 400 K/uL 540     No images are attached to the encounter.  DG Hand Complete Left  Result Date: 11/03/2022 CLINICAL DATA:  Pain after fall. EXAM: LEFT HAND - COMPLETE 3+ VIEW COMPARISON:  None Available. FINDINGS:  Mildly decreased bone mineralization. 1.5 mm ulnar negative variance. Severe triscaphe joint space narrowing and subchondral sclerosis. Severe thumb carpometacarpal joint space narrowing, subchondral sclerosis, peripheral osteophytosis. Moderate to severe lateral and mild medial thumb interphalangeal joint space narrowing, subchondral sclerosis, and peripheral osteophytosis. Mild second through fifth DIP greater than PIP joint space narrowing and peripheral osteophytosis. No acute fracture or dislocation. IMPRESSION: 1. No acute fracture or dislocation. 2. Severe triscaphe and thumb carpometacarpal osteoarthritis. 3. Moderate to severe lateral aspect of the thumb interphalangeal joint osteoarthritis. Electronically Signed   By: Neita Garnet M.D.   On: 11/03/2022 11:02   DG Hip Unilat W or Wo Pelvis 2-3 Views Left  Result Date: 11/03/2022 CLINICAL DATA:  Status post fall. EXAM: DG HIP (WITH OR WITHOUT PELVIS) 2-3V LEFT COMPARISON:  CT chest, abdomen, and pelvis 02/15/2022 FINDINGS: Status post bilateral total hip arthroplasty. No perihardware lucency is seen to indicate hardware failure or loosening. Please note the distal right femoral stem is not included on the current pelvis and left hip radiographs. Moderate pubic symphysis joint space narrowing, subchondral sclerosis, peripheral osteophytosis, similar to prior. The bilateral sacroiliac joint spaces are maintained with mild subchondral sclerosis degenerative change. Moderate L4-5 disc space narrowing, endplate sclerosis, and peripheral osteophytosis. No acute fracture or dislocation. Vascular phleboliths overlie the pelvis. IMPRESSION: 1. Status post bilateral total hip arthroplasty. No evidence of hardware failure or loosening. No acute fracture is seen. 2. Moderate pubic symphysis osteoarthritis. 3. Moderate L4-5 degenerative disc disease. Electronically Signed   By: Neita Garnet M.D.   On: 11/03/2022 11:00   DG Knee Complete 4 Views Left  Result  Date: 11/03/2022 CLINICAL DATA:  Status post fall. EXAM: LEFT KNEE - COMPLETE 4+ VIEW COMPARISON:  None Available. FINDINGS: Mild medial and lateral compartment joint space narrowing. Mild lateral compartment chondrocalcinosis. No joint effusion. No acute fracture is seen. There is mild smooth benign-appearing cortical thickening of the lateral aspect of the proximal fibular diaphysis, centered approximately 14 cm distal to the most proximal aspect of the fibula. This appears chronic and benign. It does not appear to represent an acute fracture. IMPRESSION: 1. Mild medial and lateral compartment of the knee osteoarthritis. 2. No acute fracture is seen. Mild smooth benign-appearing cortical thickening of the lateral aspect of the proximal fibular diaphysis. This appears chronic and benign. It does not appear to represent an acute fracture. Recommend clinical correlation for point tenderness. Electronically Signed   By: Neita Garnet M.D.   On: 11/03/2022 10:58   CT Cervical Spine Wo Contrast  Result Date: 11/03/2022 CLINICAL DATA:  77 year old female status post fall forward out  of the back door, striking head. EXAM: CT CERVICAL SPINE WITHOUT CONTRAST TECHNIQUE: Multidetector CT imaging of the cervical spine was performed without intravenous contrast. Multiplanar CT image reconstructions were also generated. RADIATION DOSE REDUCTION: This exam was performed according to the departmental dose-optimization program which includes automated exposure control, adjustment of the mA and/or kV according to patient size and/or use of iterative reconstruction technique. COMPARISON:  CT cervical spine 08/17/2005. FINDINGS: Alignment: Improved cervical lordosis since 2007. Cervicothoracic junction alignment is within normal limits. Bilateral posterior element alignment is within normal limits. Skull base and vertebrae: Visualized skull base is intact. No atlanto-occipital dissociation. C1 and C2 appear intact and aligned.  Postoperative details are below. No acute osseous abnormality identified. Soft tissues and spinal canal: No prevertebral fluid or swelling. No visible canal hematoma. Right IJ approach Port-A-Cath. Calcified carotid atherosclerosis bilaterally in the neck. Disc levels: Cervical ACDF C4 through C7 is new from the 2007 CT. Evidence of solid arthrodesis. No hardware loosening. Adjacent segment disease at C3-C4 and C7-T1. Mild if any subsequent spinal stenosis at C3-C4 by CT. Upper chest: Emphysema. Layering left pleural effusion. Right chest Port-A-Cath. Visible upper thoracic levels appear intact. Other: Bilateral TMJ degeneration greater on the left. IMPRESSION: 1. No acute traumatic injury identified in the cervical spine. 2. Previous C4 through C7 ACDF with adjacent segment disease but no other adverse features. 3.  Emphysema (ICD10-J43.9).  Layering left pleural effusion. Electronically Signed   By: Odessa Fleming M.D.   On: 11/03/2022 10:30   CT Head Wo Contrast  Result Date: 11/03/2022 CLINICAL DATA:  77 year old female status post fall forward out of the back door, striking head. EXAM: CT HEAD WITHOUT CONTRAST TECHNIQUE: Contiguous axial images were obtained from the base of the skull through the vertex without intravenous contrast. RADIATION DOSE REDUCTION: This exam was performed according to the departmental dose-optimization program which includes automated exposure control, adjustment of the mA and/or kV according to patient size and/or use of iterative reconstruction technique. COMPARISON:  Brain MRI 01/21/2022.  Head CT 08/17/2005. FINDINGS: Brain: Cerebral volume is stable from the MRI last year, normal for age. No midline shift, ventriculomegaly, mass effect, evidence of mass lesion, intracranial hemorrhage or evidence of cortically based acute infarction. Gray-white matter differentiation is within normal limits for age. Vascular: No suspicious intracranial vascular hyperdensity. Calcified  atherosclerosis at the skull base. Skull: No acute osseous abnormality identified. Sinuses/Orbits: Visualized paranasal sinuses and mastoids are stable and well aerated. Other: No acute orbit or scalp soft tissue injury identified. IMPRESSION: No acute traumatic injury identified. Normal for age noncontrast CT appearance of the brain. Electronically Signed   By: Odessa Fleming M.D.   On: 11/03/2022 10:26   US Venous Img Lower Bilateral (DVT)  Result Date: 10/25/2022 CLINICAL DATA:  Positive D-dimer Pain and edema History of lung malignancy EXAM: Bilateral lower Extremity Venous Doppler Ultrasound TECHNIQUE: Gray-scale sonography with compression, as well as color and duplex ultrasound, were performed to evaluate the deep venous system(s) from the level of the common femoral vein through the popliteal and proximal calf veins. COMPARISON:  None available FINDINGS: VENOUS Normal compressibility of the common femoral, superficial femoral, and popliteal veins, as well as the visualized calf veins. Visualized portions of profunda femoral vein and great saphenous vein unremarkable. No filling defects to suggest DVT on grayscale or color Doppler imaging. Doppler waveforms show normal direction of venous flow, normal respiratory plasticity and response to augmentation. OTHER None. Limitations: none IMPRESSION: No  lower extremity DVT. Electronically  Signed   By: Acquanetta Belling M.D.   On: 10/25/2022 15:08   CT Angio Chest Pulmonary Embolism (PE) W or WO Contrast  Result Date: 10/20/2022 CLINICAL DATA:  Cough. EXAM: CT ANGIOGRAPHY CHEST WITH CONTRAST TECHNIQUE: Multidetector CT imaging of the chest was performed using the standard protocol during bolus administration of intravenous contrast. Multiplanar CT image reconstructions and MIPs were obtained to evaluate the vascular anatomy. RADIATION DOSE REDUCTION: This exam was performed according to the departmental dose-optimization program which includes automated exposure  control, adjustment of the mA and/or kV according to patient size and/or use of iterative reconstruction technique. CONTRAST:  75mL OMNIPAQUE IOHEXOL 350 MG/ML SOLN COMPARISON:  Aug 07, 2022.  October 03, 2022. FINDINGS: Cardiovascular: Satisfactory opacification of the pulmonary arteries to the segmental level. No evidence of pulmonary embolism. Normal heart size. Small pericardial effusion. Mediastinum/Nodes: Thyroid gland is unremarkable. Post radiation changes are noted in left hilar region. Esophagus is unremarkable. No definite adenopathy is noted. Lungs/Pleura: Emphysematous disease is noted. Small left pleural effusion is noted. No pneumothorax is noted. Stable post treatment change is noted in right lower lobe. Upper Abdomen: No acute abnormality. Musculoskeletal: No chest wall abnormality. No acute or significant osseous findings. Review of the MIP images confirms the above findings. IMPRESSION: No definite evidence of pulmonary embolus. Stable post radiation changes are noted in left hilar and right basilar regions. Small pericardial effusion. Small left pleural effusion. Aortic Atherosclerosis (ICD10-I70.0) and Emphysema (ICD10-J43.9). Electronically Signed   By: Lupita Raider M.D.   On: 10/20/2022 15:10     Assessment and plan- Patient is a 77 y.o. female with history of locally recurrent non-small cell lung cancer adenocarcinoma.  She is here for on treatment assessment prior to cycle 8 of maintenance Alimta  Maintenance Alimta was restarted in March 2024 after scan showed concerns for recurrent disease.  Her last PET scan in July 2024 overall showed stable disease.  She does have progressive anemia with a hemoglobin of 9.1 presently.  Iron studies and B12 folate are normal.  She is getting B12 injections every 3 weeks.  She will proceed with cycle 8 of maintenance Alimta today and I am reducing the dose from 500 mg/m to 375 mg/m.  If there is any further worsening of her anemia may have to  consider giving her a treatment break or switching her to alternative treatment.  I will see her back in 3 weeks for cycle 9   Visit Diagnosis 1. Malignant neoplasm of upper lobe of left lung (HCC)   2. Antineoplastic chemotherapy induced anemia   3. Encounter for antineoplastic chemotherapy      Dr. Owens Shark, MD, MPH [facility] [phone] 11/15/2022 12:53 PM

## 2022-11-15 NOTE — Patient Instructions (Signed)
 [facility]  Discharge Instructions: Thank you for choosing [facility] to provide your oncology and hematology care.  If you have a lab appointment with the Cancer Center, please go directly to the Cancer Center and check in at the registration area.  Wear comfortable clothing and clothing appropriate for easy access to any Portacath or PICC line.   We strive to give you quality time with your provider. You may need to reschedule your appointment if you arrive late (15 or more minutes).  Arriving late affects you and other patients whose appointments are after yours.  Also, if you miss three or more appointments without notifying the office, you may be dismissed from the clinic at the provider's discretion.      For prescription refill requests, have your pharmacy contact our office and allow 72 hours for refills to be completed.    Today you received the following chemotherapy and/or immunotherapy agents Alimta      To help prevent nausea and vomiting after your treatment, we encourage you to take your nausea medication as directed.  BELOW ARE SYMPTOMS THAT SHOULD BE REPORTED IMMEDIATELY: *FEVER GREATER THAN 100.4 F (38 C) OR HIGHER *CHILLS OR SWEATING *NAUSEA AND VOMITING THAT IS NOT CONTROLLED WITH YOUR NAUSEA MEDICATION *UNUSUAL SHORTNESS OF BREATH *UNUSUAL BRUISING OR BLEEDING *URINARY PROBLEMS (pain or burning when urinating, or frequent urination) *BOWEL PROBLEMS (unusual diarrhea, constipation, pain near the anus) TENDERNESS IN MOUTH AND THROAT WITH OR WITHOUT PRESENCE OF ULCERS (sore throat, sores in mouth, or a toothache) UNUSUAL RASH, SWELLING OR PAIN  UNUSUAL VAGINAL DISCHARGE OR ITCHING   Items with * indicate a potential emergency and should be followed up as soon as possible or go to the Emergency Department if any problems should occur.  Please show the CHEMOTHERAPY ALERT CARD or IMMUNOTHERAPY ALERT CARD at check-in to the  Emergency Department and triage nurse.  Should you have questions after your visit or need to cancel or reschedule your appointment, please contact [facility]  [phone] and follow the prompts.  Office hours are 8:00 a.m. to 4:30 p.m. Monday - Friday. Please note that voicemails left after 4:00 p.m. may not be returned until the following business day.  We are closed weekends and major holidays. You have access to a nurse at all times for urgent questions. Please call the main number to the clinic [phone] and follow the prompts.  For any non-urgent questions, you may also contact your provider using MyChart. We now offer e-Visits for anyone 62 and older to request care online for non-urgent symptoms. For details visit [email or web address].   Also download the MyChart app! Go to the app store, search "MyChart", open the app, select [place], and log in with your MyChart username and password.

## 2022-11-16 ENCOUNTER — Other Ambulatory Visit: Payer: Self-pay

## 2022-11-22 ENCOUNTER — Telehealth: Payer: Self-pay | Admitting: Oncology

## 2022-11-22 ENCOUNTER — Telehealth: Payer: Self-pay | Admitting: *Deleted

## 2022-11-22 ENCOUNTER — Encounter: Payer: Self-pay | Admitting: *Deleted

## 2022-11-22 NOTE — Telephone Encounter (Signed)
This patient called to cancel infusion appointment for 9/23. She request to keep the Dr and labs but the chemo is making her to sick and she states she doesn't want to have it anymore. Please advise

## 2022-11-22 NOTE — Telephone Encounter (Signed)
Pt. Called with : This patient called to cancel infusion appointment for 9/23. She request to keep the Dr and labs but the chemo is making her to sick and she states she doesn't want to have it anymore. Please advise Dr. Smith Robert says for [place] to cancel the infusion but the pt will come 9/23 for labs and see md. I sent the message on my chart.

## 2022-12-02 ENCOUNTER — Encounter: Payer: Self-pay | Admitting: Oncology

## 2022-12-02 MED FILL — Dexamethasone Sodium Phosphate Inj 100 MG/10ML: INTRAMUSCULAR | Qty: 1 | Status: AC

## 2022-12-05 ENCOUNTER — Encounter: Payer: Self-pay | Admitting: Oncology

## 2022-12-05 ENCOUNTER — Inpatient Hospital Stay (HOSPITAL_BASED_OUTPATIENT_CLINIC_OR_DEPARTMENT_OTHER): Payer: Medicare Other | Admitting: Oncology

## 2022-12-05 ENCOUNTER — Inpatient Hospital Stay: Payer: Medicare Other

## 2022-12-05 ENCOUNTER — Other Ambulatory Visit: Payer: Self-pay | Admitting: *Deleted

## 2022-12-05 ENCOUNTER — Ambulatory Visit: Payer: Medicare Other

## 2022-12-05 VITALS — BP 116/72 | HR 80 | Temp 96.1°F | Resp 18 | Wt 154.2 lb

## 2022-12-05 VITALS — BP 108/61 | HR 78

## 2022-12-05 DIAGNOSIS — C3412 Malignant neoplasm of upper lobe, left bronchus or lung: Secondary | ICD-10-CM

## 2022-12-05 DIAGNOSIS — D6481 Anemia due to antineoplastic chemotherapy: Secondary | ICD-10-CM

## 2022-12-05 DIAGNOSIS — E876 Hypokalemia: Secondary | ICD-10-CM

## 2022-12-05 DIAGNOSIS — Z95828 Presence of other vascular implants and grafts: Secondary | ICD-10-CM

## 2022-12-05 DIAGNOSIS — T451X5A Adverse effect of antineoplastic and immunosuppressive drugs, initial encounter: Secondary | ICD-10-CM

## 2022-12-05 LAB — CBC WITH DIFFERENTIAL/PLATELET
Abs Immature Granulocytes: 0.03 10*3/uL (ref 0.00–0.07)
Basophils Absolute: 0 10*3/uL (ref 0.0–0.1)
Basophils Relative: 0 %
Eosinophils Absolute: 0.1 10*3/uL (ref 0.0–0.5)
Eosinophils Relative: 1 %
HCT: 27.5 % — ABNORMAL LOW (ref 36.0–46.0)
Hemoglobin: 8.9 g/dL — ABNORMAL LOW (ref 12.0–15.0)
Immature Granulocytes: 1 %
Lymphocytes Relative: 10 %
Lymphs Abs: 0.6 10*3/uL — ABNORMAL LOW (ref 0.7–4.0)
MCH: 29.9 pg (ref 26.0–34.0)
MCHC: 32.4 g/dL (ref 30.0–36.0)
MCV: 92.3 fL (ref 80.0–100.0)
Monocytes Absolute: 0.8 10*3/uL (ref 0.1–1.0)
Monocytes Relative: 14 %
Neutro Abs: 4.3 10*3/uL (ref 1.7–7.7)
Neutrophils Relative %: 74 %
Platelets: 511 10*3/uL — ABNORMAL HIGH (ref 150–400)
RBC: 2.98 MIL/uL — ABNORMAL LOW (ref 3.87–5.11)
RDW: 18.9 % — ABNORMAL HIGH (ref 11.5–15.5)
WBC: 5.8 10*3/uL (ref 4.0–10.5)
nRBC: 0 % (ref 0.0–0.2)

## 2022-12-05 LAB — COMPREHENSIVE METABOLIC PANEL
ALT: 17 U/L (ref 0–44)
AST: 23 U/L (ref 15–41)
Albumin: 3.6 g/dL (ref 3.5–5.0)
Alkaline Phosphatase: 93 U/L (ref 38–126)
Anion gap: 16 — ABNORMAL HIGH (ref 5–15)
BUN: 21 mg/dL (ref 8–23)
CO2: 37 mmol/L — ABNORMAL HIGH (ref 22–32)
Calcium: 8.9 mg/dL (ref 8.9–10.3)
Chloride: 80 mmol/L — ABNORMAL LOW (ref 98–111)
Creatinine, Ser: 0.93 mg/dL (ref 0.44–1.00)
GFR, Estimated: >60 mL/min (ref >60)
Glucose, Bld: 105 mg/dL — ABNORMAL HIGH (ref 70–99)
Potassium: 2.5 mmol/L — CL (ref 3.5–5.1)
Sodium: 133 mmol/L — ABNORMAL LOW (ref 135–145)
Total Bilirubin: 0.5 mg/dL (ref 0.3–1.2)
Total Protein: 6.5 g/dL (ref 6.5–8.1)

## 2022-12-05 MED ORDER — HEPARIN SOD (PORK) LOCK FLUSH 100 UNIT/ML IV SOLN
500.0000 [IU] | Freq: Once | INTRAVENOUS | Status: AC
  Administered 2022-12-05: 500 [IU] via INTRAVENOUS
  Filled 2022-12-05: qty 5

## 2022-12-05 MED ORDER — POTASSIUM CHLORIDE IN NACL 20-0.9 MEQ/L-% IV SOLN
Freq: Once | INTRAVENOUS | Status: AC
  Filled 2022-12-05: qty 1000

## 2022-12-05 MED ORDER — POTASSIUM ACETATE CRYS: 20.0000 meq | CRYSTALS | Freq: Every day | 0 refills | Status: DC

## 2022-12-05 MED ORDER — SODIUM CHLORIDE 0.9% FLUSH
10.0000 mL | INTRAVENOUS | Status: DC | PRN
  Administered 2022-12-05: 10 mL via INTRAVENOUS
  Filled 2022-12-05: qty 10

## 2022-12-05 MED ORDER — SODIUM CHLORIDE 0.9% FLUSH
10.0000 mL | Freq: Once | INTRAVENOUS | Status: AC
  Administered 2022-12-05: 10 mL via INTRAVENOUS
  Filled 2022-12-05: qty 10

## 2022-12-05 NOTE — Progress Notes (Signed)
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  EGFR and Ros as well as MET testing negative. PD-L1 was 50%   Patient completed concurrent chemoradiation with weekly carbotaxol chemotherapy on 03/12/2019.  Maintenance durvalumab started in January 2021 after scan showed partial response.  Patient completed 1 year of maintenance durvalumab in February 2022   Patient was found to have enlarging mediastinal adenopathyOn subsequent CT scan in January 2023.  This was followed by a PET scan which showed hypermetabolic mediastinal and right hilar lymph nodes but no other evidence of disease elsewhere.  This was biopsied and was consistent with non-small cell lung cancer specifically adenocarcinoma.  Cells were positive for TTF-1 and Napsin A and negative for p40.   Patient completed concurrent chemoradiation with 4 cycles of carbo Alimta ending in May 2023.  Scan showed partial response.  Plan is for maintenance Alimta until progression or toxicity.  Alimta was stopped in November 2023 due to progressive anemia and stable scans   Patient was hospitalized in November  2023 for acute on chronic hypoxic respiratory failure secondary to healthcare associated pneumonia.  She had pleural effusions which were tapped and thoracentesis was negative for malignancy.   Scans in March 2024 showed concern for progressive disease with appearance of new right lung nodules as well as progressive mediastinal adenopathy.  Also noted to have right pleural effusion which was tapped and was negative for malignancy.  Single agent Alimta was restarted  Interval history-patient reports ongoing fatigue.  She is on home oxygen 3 L.  She reports dryness in her throat due to oxygen which can cause difficulty swallowing at times.    Pain scale- 3 Opioid associated constipation- no  Review of systems- Review of Systems  Constitutional:  Positive for malaise/fatigue. Negative for chills, fever and weight loss.  HENT:  Negative for congestion, ear discharge and nosebleeds.   Eyes:  Negative for blurred vision.  Respiratory:  Positive for shortness of breath. Negative for cough, hemoptysis, sputum production and wheezing.   Cardiovascular:  Negative for chest pain, palpitations, orthopnea and claudication.  Gastrointestinal:  Negative for abdominal pain, blood in stool, constipation, diarrhea, heartburn, melena, nausea and vomiting.  Genitourinary:  Negative for dysuria, flank pain, frequency, hematuria and urgency.  Musculoskeletal:  Negative for back pain, joint pain and myalgias.  Skin:  Negative for rash.  Neurological:  Negative for dizziness, tingling, focal weakness, seizures, weakness and headaches.  Endo/Heme/Allergies:  Does not bruise/bleed easily.  Psychiatric/Behavioral:  Negative for depression and suicidal ideas. The patient does not have insomnia.       Allergies  Allergen Reactions   Aspirin Hives, Shortness Of Breath and Other (See Comments)    Difficulty breathing  Celebrex [Celecoxib] Shortness Of Breath   Morphine And Codeine Nausea And Vomiting and Swelling    Adhesive [Tape] Other (See Comments)    Took top layer of skin off when removed.   Clarithromycin Nausea And Vomiting   Codeine Nausea And Vomiting   Darvon [Propoxyphene Hcl] Nausea And Vomiting   Demerol [Meperidine] Nausea And Vomiting   Flonase [Fluticasone Propionate] Other (See Comments)    Fungal infection in nose   Simvastatin Other (See Comments)    "caused ulcers in mouth, and fever"   Talwin [Pentazocine] Nausea And Vomiting     Past Medical History:  Diagnosis Date   Anemia    Asthma    No Inhalers--Dr. Meredeth Ide will order as needed   Bronchiectasis (HCC)    mild   Chronic headaches     followed by [facility] migraines   COPD (chronic obstructive pulmonary disease) (HCC)    DDD (degenerative disc disease), lumbar    Diverticulosis    Family history of adverse reaction to anesthesia    mother and sisters-PONV   Gall stones    history of   GERD (gastroesophageal reflux disease)    EGD 8/09- non bleeding erosive gastritis, documentd esophageal ulcerations.    Hiatal hernia    small   History of kidney stones    History of pneumonia    Hypercholesterolemia    IBS (irritable bowel syndrome)    Malignant neoplasm of upper lobe of left lung (HCC) 12/27/2018   Meniere disease    Murmur    Osteoarthritis    lumbar disc disease, left hip   Personal history of chemotherapy    Personal history of radiation therapy    Pneumonia 12/2020   PONV (postoperative nausea and vomiting)    Sleep apnea    uses cpap   Vertigo    Weakness of right side of body      Past Surgical History:  Procedure Laterality Date   ABDOMINAL HYSTERECTOMY  age 66   ANTERIOR CERVICAL DECOMP/DISCECTOMY FUSION N/A 02/24/2015   Procedure: CERVICAL FOUR-FIVE, CERVICAL FIVE-SIX, CERVICAL SIX-SEVEN ANTERIOR CERVICAL DECOMPRESSION/DISCECTOMY FUSION ;  Surgeon: Lisbeth Renshaw, MD;  Location: [facility];  Service: Neurosurgery;  Laterality: N/A;  C45 C56 C67 anterior cervical decompression  with fusion interbody prosthesis plating and bonegraft   APPENDECTOMY     BACK SURGERY  1988   4th lumbar fusion   BLADDER SURGERY N/A    with vaginal wall repair   BREAST CYST ASPIRATION Bilateral    neg   BREAST SURGERY Bilateral    cyst removed and reduction   CARDIAC CATHETERIZATION  2014   CARPAL TUNNEL RELEASE Right 02/11/2016   Procedure: CARPAL TUNNEL RELEASE;  Surgeon: Kennedy Bucker, MD;  Location: [facility] ORS;  Service: Orthopedics;  Laterality: Right;   CATARACT EXTRACTION W/ INTRAOCULAR LENS IMPLANT Bilateral 2015   CHOLECYSTECTOMY     EUS N/A 05/31/2012   Procedure: UPPER ENDOSCOPIC ULTRASOUND (EUS) LINEAR;  Surgeon: Rachael Fee, MD;  Location: [facility];  Service: Endoscopy;  Laterality: N/A;   EXCISIONAL HEMORRHOIDECTOMY     JOINT REPLACEMENT Bilateral    KNEE ARTHROSCOPY WITH LATERAL MENISECTOMY Right 07/07/2015   Procedure: KNEE ARTHROSCOPY WITH LATERAL MENISECTOMY, PARTIAL SYNOVECTOMY;  Surgeon: Kennedy Bucker, MD;  Location: [facility] ORS;  Service: Orthopedics;  Laterality: Right;   LUMBAR LAMINECTOMY     PORTA CATH INSERTION N/A 01/07/2019   Procedure: PORTA CATH INSERTION;  Surgeon: Annice Needy, MD;  Location: [facility];  Service: Cardiovascular;  Laterality: N/A;   REDUCTION MAMMAPLASTY  1990   RIGHT OOPHORECTOMY     TOTAL HIP ARTHROPLASTY Left 05/01/2014   Dr. Sheppard Evens   TOTAL HIP ARTHROPLASTY Right 08/05/2014   Procedure: TOTAL HIP ARTHROPLASTY ANTERIOR APPROACH;  Surgeon: Kennedy Bucker, MD;  Location: [facility] ORS;  Service: Orthopedics;  Laterality: Right;   ULNAR NERVE TRANSPOSITION Right 02/11/2016   Procedure: ULNAR NERVE DECOMPRESSION/TRANSPOSITION;  Surgeon: Kennedy Bucker, MD;  Location: [facility] ORS;  Service: Orthopedics;  Laterality: Right;   VIDEO BRONCHOSCOPY WITH ENDOBRONCHIAL ULTRASOUND Left 12/19/2018   Procedure: VIDEO BRONCHOSCOPY WITH ENDOBRONCHIAL ULTRASOUND, LEFT, SLEEP APNEA;  Surgeon: Vida Rigger, MD;  Location: [facility] ORS;   Service: Thoracic;  Laterality: Left;   VIDEO BRONCHOSCOPY WITH ENDOBRONCHIAL ULTRASOUND Right 05/14/2021   Procedure: VIDEO BRONCHOSCOPY WITH ENDOBRONCHIAL ULTRASOUND;  Surgeon: Salena Saner, MD;  Location: [facility] ORS;  Service: Pulmonary;  Laterality: Right;    Social History   Socioeconomic History   Marital status: Married    Spouse name: Not on file   Number of children: Not on file   Years of education: Not on file   Highest education level: Not on file  Occupational History   Not on file  Tobacco Use   Smoking status: Former    Current packs/day: 0.00    Average packs/day: 1 pack/day for 40.0 years (40.0 ttl pk-yrs)    Types: Cigarettes    Start date: 06/13/1967    Quit date: 06/13/2007    Years since quitting: 15.4   Smokeless tobacco: Never  Vaping Use   Vaping status: Never Used  Substance and Sexual Activity   Alcohol use: No    Alcohol/week: 0.0 standard drinks of alcohol   Drug use: No   Sexual activity: Yes  Other Topics Concern   Not on file  Social History Narrative   Not on file   Social Determinants of Health   Financial Resource Strain: Not on file  Food Insecurity: No Food Insecurity (08/08/2022)   Hunger Vital Sign    Worried About Running Out of Food in the Last Year: Never true    Ran Out of Food in the Last Year: Never true  Transportation Needs: No Transportation Needs (08/08/2022)   PRAPARE - Administrator, Civil Service (Medical): No    Lack of Transportation (Non-Medical): No  Physical Activity: Not on file  Stress: Not on file  Social Connections: Not on file  Intimate Partner Violence: Not At Risk (08/08/2022)   Humiliation, Afraid, Rape, and Kick questionnaire    Fear of Current or Ex-Partner: No    Emotionally Abused: No    Physically Abused: No    Sexually Abused: No    Family History  Problem Relation Age of Onset   Heart disease Mother        s/p stent   Hypertension Mother    Hypercholesterolemia Mother     Diabetes Father    Stomach cancer Other        uncle   Breast cancer Cousin    Breast cancer Paternal Aunt      Current Outpatient Medications:    acetaminophen (TYLENOL) 500 MG tablet, Take 500 mg by mouth 2 (two) times daily., Disp: , Rfl:    amiodarone (PACERONE) 400 MG tablet, Take by mouth., Disp: , Rfl:    apixaban (ELIQUIS) 5 MG TABS tablet, Take 1 tablet (5 mg total) by mouth 2 (two) times daily., Disp: 60 tablet, Rfl: 0  Bioflavonoid Products (ESTER C PO), Take 1 tablet by mouth daily. 500 mg, Disp: , Rfl:    Calcium-Magnesium-Zinc (CAL-MAG-ZINC PO), Take 1 tablet by mouth daily., Disp: , Rfl:    ezetimibe (ZETIA) 10 MG tablet, Take 5 mg by mouth in the morning and at bedtime., Disp: , Rfl:    glycerin adult 2 g suppository, Place 1 suppository rectally as needed for constipation., Disp: , Rfl:    Histamine Dihydrochloride (AUSTRALIAN DREAM ARTHRITIS) 0.025 % CREA, Apply 1 application  topically 4 (four) times daily as needed (pain)., Disp: , Rfl:    HYDROcodone-acetaminophen (NORCO/VICODIN) 5-325 MG tablet, Take 1 tablet by mouth at bedtime., Disp: 30 tablet, Rfl: 0   ipratropium-albuterol (DUONEB) 0.5-2.5 (3) MG/3ML SOLN, Take 3 mLs by nebulization every 6 (six) hours as needed., Disp: 360 mL, Rfl: 0   loratadine (CLARITIN) 10 MG tablet, Take 10 mg by mouth daily., Disp: , Rfl:    metoprolol tartrate (LOPRESSOR) 25 MG tablet, Take 0.5 tablets (12.5 mg total) by mouth 2 (two) times daily., Disp: 30 tablet, Rfl: 2   ondansetron (ZOFRAN) 8 MG tablet, Take 1 tablet (8 mg total) by mouth every 8 (eight) hours as needed for nausea or vomiting., Disp: 20 tablet, Rfl: 0   pantoprazole (PROTONIX) 40 MG tablet, Take 40 mg by mouth 2 (two) times daily., Disp: , Rfl:    Polyethyl Glyc-Propyl Glyc PF (SYSTANE HYDRATION PF) 0.4-0.3 % SOLN, Place 1 drop into both eyes as needed. as, Disp: , Rfl:    polyethylene glycol powder (GLYCOLAX/MIRALAX) 17 GM/SCOOP powder, Take 17 g by mouth daily as  needed for moderate constipation., Disp: , Rfl:    Potassium Acetate CRYS, 20 mEq by Does not apply route daily., Disp: 10 g, Rfl: 0   Sennosides (SENNA) 8.6 MG CAPS, Take 1 capsule by mouth daily as needed (constipation)., Disp: , Rfl:    spironolactone (ALDACTONE) 50 MG tablet, Hold until followup with outpatient provider due to acute kidney injury., Disp: , Rfl:    torsemide (DEMADEX) 20 MG tablet, Hold until followup with outpatient provider due to acute kidney injury., Disp: , Rfl:    triamcinolone (NASACORT) 55 MCG/ACT AERO nasal inhaler, Place 2 sprays into the nose daily as needed., Disp: , Rfl:    UBRELVY 100 MG TABS, Take 100 mg by mouth daily as needed (migraines)., Disp: , Rfl:    Wheat Dextrin (EQ FIBER POWDER PO), Take 1 Dose by mouth as needed., Disp: , Rfl:    cyclobenzaprine (FLEXERIL) 10 MG tablet, Take 10 mg by mouth at bedtime. (Patient not taking: Reported on 12/05/2022), Disp: , Rfl:  No current facility-administered medications for this visit.  Facility-Administered Medications Ordered in Other Visits:    sodium chloride flush (NS) 0.9 % injection 10 mL, 10 mL, Intravenous, PRN, Creig Hines, MD, 10 mL at 12/05/22 1011  Physical exam:  Vitals:   12/05/22 0926  BP: 116/72  Pulse: 80  Resp: 18  Temp: (!) 96.1 F (35.6 C)  TempSrc: Tympanic  SpO2: 100%  Weight: 154 lb 3.2 oz (69.9 kg)   Physical Exam Cardiovascular:     Rate and Rhythm: Normal rate and regular rhythm.     Heart sounds: Normal heart sounds.  Pulmonary:     Comments: Effort of breathing increased.  Breath sounds decreased over right lung base Abdominal:     General: Bowel sounds are normal.     Palpations: Abdomen is soft.  Skin:    General: Skin is warm  and dry.  Neurological:     Mental Status: She is alert and oriented to person, place, and time.         Latest Ref Rng & Units 12/05/2022    9:04 AM  CMP  Glucose 70 - 99 mg/dL 846   BUN 8 - 23 mg/dL 21   Creatinine 9.62 - 1.00 mg/dL  9.52   Sodium 841 - 324 mmol/L 133   Potassium 3.5 - 5.1 mmol/L 2.5   Chloride 98 - 111 mmol/L 80   CO2 22 - 32 mmol/L 37   Calcium 8.9 - 10.3 mg/dL 8.9   Total Protein 6.5 - 8.1 g/dL 6.5   Total Bilirubin 0.3 - 1.2 mg/dL 0.5   Alkaline Phos 38 - 126 U/L 93   AST 15 - 41 U/L 23   ALT 0 - 44 U/L 17       Latest Ref Rng & Units 12/05/2022    9:04 AM  CBC  WBC 4.0 - 10.5 K/uL 5.8   Hemoglobin 12.0 - 15.0 g/dL 8.9   Hematocrit 40.1 - 46.0 % 27.5   Platelets 150 - 400 K/uL 511      Assessment and plan- Patient is a 77 y.o. female  with history of locally recurrent non-small cell lung cancer adenocarcinoma. She is here for on treatment assessment prior to cycle 9 of maintenance alimita.   Patient is more anemic today with a hemoglobin of 8.9.  Suspect this is secondary to Alimta.  I will hold off on giving her Alimta until her next scan.  I will plan to see her back in early November 2024 with repeat CT chest abdomen pelvis with contrast prior.  Hypokalemia: Will give IV potassium today and send her home with oral potassium.  Repeat BMP checked again this week   Visit Diagnosis 1. Hypokalemia   2. Malignant neoplasm of upper lobe of left lung (HCC)   3. Antineoplastic chemotherapy induced anemia      Dr. Owens Shark, MD, MPH [facility] [phone] 12/05/2022 12:37 PM

## 2022-12-06 ENCOUNTER — Other Ambulatory Visit: Payer: Self-pay

## 2022-12-08 ENCOUNTER — Telehealth: Payer: Self-pay | Admitting: *Deleted

## 2022-12-08 ENCOUNTER — Inpatient Hospital Stay: Payer: Medicare Other

## 2022-12-08 DIAGNOSIS — C3412 Malignant neoplasm of upper lobe, left bronchus or lung: Secondary | ICD-10-CM

## 2022-12-08 DIAGNOSIS — E876 Hypokalemia: Secondary | ICD-10-CM

## 2022-12-08 LAB — BASIC METABOLIC PANEL
Anion gap: 10 (ref 5–15)
BUN: 12 mg/dL (ref 8–23)
CO2: 36 mmol/L — ABNORMAL HIGH (ref 22–32)
Calcium: 8.9 mg/dL (ref 8.9–10.3)
Chloride: 87 mmol/L — ABNORMAL LOW (ref 98–111)
Creatinine, Ser: 0.58 mg/dL (ref 0.44–1.00)
GFR, Estimated: >60 mL/min (ref >60)
Glucose, Bld: 104 mg/dL — ABNORMAL HIGH (ref 70–99)
Potassium: 2.6 mmol/L — CL (ref 3.5–5.1)
Sodium: 133 mmol/L — ABNORMAL LOW (ref 135–145)

## 2022-12-08 MED ORDER — SODIUM CHLORIDE 0.9 % IV SOLN
40.0000 meq | Freq: Once | INTRAVENOUS | Status: DC
Start: 2022-12-08 — End: 2022-12-08

## 2022-12-08 MED ORDER — POTASSIUM CHLORIDE CRYS ER 20 MEQ PO TBCR: 20.0000 meq | EXTENDED_RELEASE_TABLET | Freq: Two times a day (BID) | ORAL | 0 refills | Status: DC

## 2022-12-08 MED ORDER — SODIUM CHLORIDE 0.9% FLUSH
10.0000 mL | Freq: Once | INTRAVENOUS | Status: AC
  Administered 2022-12-08: 10 mL via INTRAVENOUS
  Filled 2022-12-08: qty 10

## 2022-12-08 MED ORDER — HEPARIN SOD (PORK) LOCK FLUSH 100 UNIT/ML IV SOLN
500.0000 [IU] | Freq: Once | INTRAVENOUS | Status: AC
  Administered 2022-12-08: 500 [IU] via INTRAVENOUS
  Filled 2022-12-08: qty 5

## 2022-12-08 MED ORDER — POTASSIUM CHLORIDE 20 MEQ/100ML IV SOLN
20.0000 meq | Freq: Once | INTRAVENOUS | Status: AC
  Administered 2022-12-08: 20 meq via INTRAVENOUS

## 2022-12-08 MED ORDER — SODIUM CHLORIDE 0.9 % IV SOLN
INTRAVENOUS | Status: DC
  Filled 2022-12-08 (×2): qty 250

## 2022-12-08 NOTE — Telephone Encounter (Signed)
Called the pt.and let her know about double up on the oral potassium. Dr. Smith Robert says to come 10/1 at 9:15 so we can check the potassium .

## 2022-12-11 ENCOUNTER — Other Ambulatory Visit: Payer: Self-pay | Admitting: Oncology

## 2022-12-12 ENCOUNTER — Other Ambulatory Visit: Payer: Self-pay | Admitting: Oncology

## 2022-12-12 NOTE — Telephone Encounter (Signed)
Component Ref Range & Units 4 d ago (12/08/22) 7 d ago (12/05/22) 3 wk ago (11/15/22) 1 mo ago (10/24/22) 2 mo ago (10/10/22) 2 mo ago (09/22/22) 2 mo ago (09/19/22)  Potassium 3.5 - 5.1 mmol/L 2.6 Low Panic  2.5 Low Panic  CM 3.7 3.5 3.6 3.9 4.0

## 2022-12-13 ENCOUNTER — Inpatient Hospital Stay: Payer: Medicare Other | Attending: Oncology

## 2022-12-13 DIAGNOSIS — E876 Hypokalemia: Secondary | ICD-10-CM | POA: Diagnosis present

## 2022-12-13 DIAGNOSIS — C3412 Malignant neoplasm of upper lobe, left bronchus or lung: Secondary | ICD-10-CM | POA: Diagnosis present

## 2022-12-13 LAB — BASIC METABOLIC PANEL
Anion gap: 12 (ref 5–15)
BUN: 22 mg/dL (ref 8–23)
CO2: 35 mmol/L — ABNORMAL HIGH (ref 22–32)
Calcium: 9.2 mg/dL (ref 8.9–10.3)
Chloride: 90 mmol/L — ABNORMAL LOW (ref 98–111)
Creatinine, Ser: 0.85 mg/dL (ref 0.44–1.00)
GFR, Estimated: >60 mL/min (ref >60)
Glucose, Bld: 121 mg/dL — ABNORMAL HIGH (ref 70–99)
Potassium: 4.4 mmol/L (ref 3.5–5.1)
Sodium: 137 mmol/L (ref 135–145)

## 2023-01-09 ENCOUNTER — Other Ambulatory Visit: Payer: Self-pay | Admitting: Oncology

## 2023-01-09 NOTE — Telephone Encounter (Signed)
Component Ref Range & Units 3 wk ago (12/13/22) 1 mo ago (12/08/22) 1 mo ago (12/05/22) 1 mo ago (11/15/22) 2 mo ago (10/24/22) 3 mo ago (10/10/22) 3 mo ago (09/22/22)  Potassium 3.5 - 5.1 mmol/L 4.4 2.6 Low Panic  CM 2.5 Low Panic  CM 3.7 3.5 3.6 3.9

## 2023-01-18 ENCOUNTER — Telehealth: Payer: Self-pay | Admitting: *Deleted

## 2023-01-18 ENCOUNTER — Ambulatory Visit
Admission: RE | Admit: 2023-01-18 | Discharge: 2023-01-18 | Disposition: A | Discharge status: Home / Self Care | Payer: Medicare Other | Source: Ambulatory Visit | Attending: Oncology | Admitting: Oncology

## 2023-01-18 DIAGNOSIS — C3412 Malignant neoplasm of upper lobe, left bronchus or lung: Secondary | ICD-10-CM | POA: Insufficient documentation

## 2023-01-18 MED ORDER — IOHEXOL 300 MG/ML  SOLN
100.0000 mL | Freq: Once | INTRAMUSCULAR | Status: AC | PRN
  Administered 2023-01-18: 100 mL via INTRAVENOUS

## 2023-01-18 NOTE — Telephone Encounter (Signed)
Patient called asking if we can help her get Home Health care Please advise

## 2023-01-19 ENCOUNTER — Telehealth: Payer: Self-pay | Admitting: *Deleted

## 2023-01-19 ENCOUNTER — Encounter: Payer: Self-pay | Admitting: Oncology

## 2023-01-19 NOTE — Telephone Encounter (Signed)
Patient wants to see if she can get home health.  I spoke to Dr. Smith Robert and she says it is fine to do that and they are going to get the CT scan read and patient will be coming over to the cancer center on next Monday to go over all the things about the scan.  We asked the patient if she wanted to do a video visit today with Sharia Reeve so they can get the home health started and the patient says that she did not have a good night sleep and she does not want to do it today.  She says that she can do it tomorrow and they gave her an appointment date and time for Friday.  1 PM and they are going to do it by her husband's phone- he has her my chart on his phone

## 2023-01-20 ENCOUNTER — Other Ambulatory Visit: Payer: Self-pay

## 2023-01-20 ENCOUNTER — Inpatient Hospital Stay: Payer: Medicare Other | Attending: Oncology | Admitting: Hospice and Palliative Medicine

## 2023-01-20 DIAGNOSIS — C3412 Malignant neoplasm of upper lobe, left bronchus or lung: Secondary | ICD-10-CM | POA: Diagnosis not present

## 2023-01-20 DIAGNOSIS — D649 Anemia, unspecified: Secondary | ICD-10-CM | POA: Insufficient documentation

## 2023-01-20 MED ORDER — BENZONATATE 100 MG PO CAPS: 100.0000 mg | ORAL_CAPSULE | Freq: Three times a day (TID) | ORAL | 0 refills | Status: DC | PRN

## 2023-01-20 NOTE — Progress Notes (Signed)
Virtual Visit via Telephone Note  I connected with Jennifer Hodges on 01/20/23 at  1:00 PM EST by telephone and verified that I am speaking with the correct person using two identifiers.  Location: Patient: Home Provider: Clinic   I discussed the limitations, risks, security and privacy concerns of performing an evaluation and management service by telephone and the availability of in person appointments. I also discussed with the patient that there may be a patient responsible charge related to this service. The patient expressed understanding and agreed to proceed.   History of Present Illness: Jennifer Hodges is a 77 y.o. 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  Patient on maintenance Alimta until November 2023 when treatment was held due to worsening anemia.  She was restarted in April 2024 due to progressive disease.    Observations/Objective: I was scheduled to have a virtual visit today with patient for consideration of home health orders.  However, patient unable to get her phone to connect virtually.  Instead, I called and spoke with her by phone.  Patient noticeably short of breath sounding on the phone.  She says that she has had worse shortness of breath over the last week or so.  Of note, patient had CT of the chest abdomen pelvis on 01/18/2023 but this has not been read yet by radiology.  Patient denies fever or chills.  She does have dry persistent cough and requests that I refill her benzonatate.  Assessment and Plan: Locally recurrent non-small cell lung cancer -last received maintenance Alimta on 11/15/2022.  Patient dyspneic sounding on the phone.  Discussed clinic visit or hospitalization and patient declined both.  She has follow-up visit scheduled for Monday to see Dr. Smith Robert.  Patient says that she would prefer just to keep that clinic  visit as scheduled.  I encouraged her to utilize the ED should symptoms worsen.  Will refill benzonatate per patient's request.  I have also called [facility] radiology to request stat read of her CT.  Weakness -unfortunately, I am not able to sign the face-to-face attestation for home health certification as we were unable to do a virtual visit today.  However, patient will be seen in clinic on Monday.  Will send referral to community palliative care also.  Follow Up Instructions: Next week as scheduled  Case and plan discussed with Dr. Smith Robert   I discussed the assessment and treatment plan with the patient. The patient was provided an opportunity to ask questions and all were answered. The patient agreed with the plan and demonstrated an understanding of the instructions.   The patient was advised to call back or seek an in-person evaluation if the symptoms worsen or if the condition fails to improve as anticipated.  I provided 15 minutes of non-face-to-face time during this encounter.   Malachy Moan, NP

## 2023-01-23 ENCOUNTER — Encounter: Payer: Self-pay | Admitting: Oncology

## 2023-01-23 ENCOUNTER — Inpatient Hospital Stay: Payer: Medicare Other

## 2023-01-23 ENCOUNTER — Encounter: Payer: Self-pay | Admitting: *Deleted

## 2023-01-23 ENCOUNTER — Inpatient Hospital Stay (HOSPITAL_BASED_OUTPATIENT_CLINIC_OR_DEPARTMENT_OTHER): Payer: Medicare Other | Admitting: Oncology

## 2023-01-23 VITALS — BP 151/87 | HR 98 | Temp 97.8°F | Resp 17 | Wt 159.8 lb

## 2023-01-23 DIAGNOSIS — Z7189 Other specified counseling: Secondary | ICD-10-CM

## 2023-01-23 DIAGNOSIS — C3412 Malignant neoplasm of upper lobe, left bronchus or lung: Secondary | ICD-10-CM | POA: Diagnosis present

## 2023-01-23 DIAGNOSIS — R0602 Shortness of breath: Secondary | ICD-10-CM

## 2023-01-23 DIAGNOSIS — D649 Anemia, unspecified: Secondary | ICD-10-CM

## 2023-01-23 DIAGNOSIS — R059 Cough, unspecified: Secondary | ICD-10-CM

## 2023-01-23 DIAGNOSIS — J90 Pleural effusion, not elsewhere classified: Secondary | ICD-10-CM

## 2023-01-23 DIAGNOSIS — E876 Hypokalemia: Secondary | ICD-10-CM | POA: Diagnosis present

## 2023-01-23 LAB — CBC
HCT: 35.8 % — ABNORMAL LOW (ref 36.0–46.0)
Hemoglobin: 11.2 g/dL — ABNORMAL LOW (ref 12.0–15.0)
MCH: 28.5 pg (ref 26.0–34.0)
MCHC: 31.3 g/dL (ref 30.0–36.0)
MCV: 91.1 fL (ref 80.0–100.0)
Platelets: 422 10*3/uL — ABNORMAL HIGH (ref 150–400)
RBC: 3.93 MIL/uL (ref 3.87–5.11)
RDW: 15.5 % (ref 11.5–15.5)
WBC: 8.4 10*3/uL (ref 4.0–10.5)
nRBC: 0 % (ref 0.0–0.2)

## 2023-01-23 LAB — VITAMIN B12: Vitamin B-12: 2347 pg/mL — ABNORMAL HIGH (ref 180–914)

## 2023-01-23 LAB — IRON AND TIBC
Iron: 37 ug/dL (ref 28–170)
Saturation Ratios: 10 % — ABNORMAL LOW (ref 10.4–31.8)
TIBC: 356 ug/dL (ref 250–450)
UIBC: 319 ug/dL

## 2023-01-23 LAB — FERRITIN: Ferritin: 553 ng/mL — ABNORMAL HIGH (ref 11–307)

## 2023-01-23 NOTE — Progress Notes (Signed)
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  EGFR and Ros as well as MET testing negative. PD-L1 was 50%   Patient completed concurrent chemoradiation with weekly carbotaxol chemotherapy on 03/12/2019.  Maintenance durvalumab started in January 2021 after scan showed partial response.  Patient completed 1 year of maintenance durvalumab in February 2022   Patient was found to have enlarging mediastinal adenopathyOn subsequent CT scan in January 2023.  This was followed by a PET scan which showed hypermetabolic mediastinal and right hilar lymph nodes but no other evidence of disease elsewhere.  This was biopsied and was consistent with non-small cell lung cancer specifically adenocarcinoma.  Cells were positive for TTF-1 and Napsin A and negative for p40.   Patient completed concurrent chemoradiation with 4 cycles of carbo Alimta ending in May 2023.  Scan showed partial response.  Plan is for maintenance Alimta until progression or toxicity.  Alimta was stopped in November 2023 due to progressive anemia and stable scans   Patient was hospitalized in November 2023 for  acute on chronic hypoxic respiratory failure secondary to healthcare associated pneumonia.  She had pleural effusions which were tapped and thoracentesis was negative for malignancy.   Scans in March 2024 showed concern for progressive disease with appearance of new right lung nodules as well as progressive mediastinal adenopathy.  Also noted to have right pleural effusion which was tapped and was negative for malignancy.  Single agent Alimta was restarted  Interval history-patient has been feeling progressive fatigue and exertional shortness of breath.  Overall her condition has declined in the last 2 to 3 months.  She is on home oxygen 3 L  ECOG PS- 3 Pain scale- 2   Review of systems- Review of Systems  Constitutional:  Positive for malaise/fatigue. Negative for chills, fever and weight loss.  HENT:  Negative for congestion, ear discharge and nosebleeds.   Eyes:  Negative for blurred vision.  Respiratory:  Positive for shortness of breath. Negative for cough, hemoptysis, sputum production and wheezing.   Cardiovascular:  Negative for chest pain, palpitations, orthopnea and claudication.  Gastrointestinal:  Negative for abdominal pain, blood in stool, constipation, diarrhea, heartburn, melena, nausea and vomiting.  Genitourinary:  Negative for dysuria, flank pain, frequency, hematuria and urgency.  Musculoskeletal:  Negative for back pain, joint pain and myalgias.  Skin:  Negative for rash.  Neurological:  Negative for dizziness, tingling, focal weakness, seizures, weakness and headaches.  Endo/Heme/Allergies:  Does not bruise/bleed easily.  Psychiatric/Behavioral:  Negative for depression and suicidal ideas. The patient does not have insomnia.       Allergies  Allergen Reactions   Aspirin Hives, Shortness Of Breath and Other (See Comments)    Difficulty breathing   Celebrex [  Celecoxib] Shortness Of Breath   Morphine And Codeine Nausea And Vomiting and Swelling   Adhesive [Tape]  Other (See Comments)    Took top layer of skin off when removed.   Clarithromycin Nausea And Vomiting   Codeine Nausea And Vomiting   Darvon [Propoxyphene Hcl] Nausea And Vomiting   Demerol [Meperidine] Nausea And Vomiting   Flonase [Fluticasone Propionate] Other (See Comments)    Fungal infection in nose   Simvastatin Other (See Comments)    "caused ulcers in mouth, and fever"   Talwin [Pentazocine] Nausea And Vomiting     Past Medical History:  Diagnosis Date   Anemia    Asthma    No Inhalers--Dr. Meredeth Ide will order as needed   Bronchiectasis (HCC)    mild   Chronic headaches     followed by [facility] migraines   COPD (chronic obstructive pulmonary disease) (HCC)    DDD (degenerative disc disease), lumbar    Diverticulosis    Family history of adverse reaction to anesthesia    mother and sisters-PONV   Gall stones    history of   GERD (gastroesophageal reflux disease)    EGD 8/09- non bleeding erosive gastritis, documentd esophageal ulcerations.    Hiatal hernia    small   History of kidney stones    History of pneumonia    Hypercholesterolemia    IBS (irritable bowel syndrome)    Malignant neoplasm of upper lobe of left lung (HCC) 12/27/2018   Meniere disease    Murmur    Osteoarthritis    lumbar disc disease, left hip   Personal history of chemotherapy    Personal history of radiation therapy    Pneumonia 12/2020   PONV (postoperative nausea and vomiting)    Sleep apnea    uses cpap   Vertigo    Weakness of right side of body      Past Surgical History:  Procedure Laterality Date   ABDOMINAL HYSTERECTOMY  age 30   ANTERIOR CERVICAL DECOMP/DISCECTOMY FUSION N/A 02/24/2015   Procedure: CERVICAL FOUR-FIVE, CERVICAL FIVE-SIX, CERVICAL SIX-SEVEN ANTERIOR CERVICAL DECOMPRESSION/DISCECTOMY FUSION ;  Surgeon: Lisbeth Renshaw, MD;  Location: [facility];  Service: Neurosurgery;  Laterality: N/A;  C45 C56 C67 anterior cervical decompression with fusion  interbody prosthesis plating and bonegraft   APPENDECTOMY     BACK SURGERY  1988   4th lumbar fusion   BLADDER SURGERY N/A    with vaginal wall repair   BREAST CYST ASPIRATION Bilateral    neg   BREAST SURGERY Bilateral    cyst removed and reduction   CARDIAC CATHETERIZATION  2014   CARPAL TUNNEL RELEASE Right 02/11/2016   Procedure: CARPAL TUNNEL RELEASE;  Surgeon: Kennedy Bucker, MD;  Location: [facility];  Service: Orthopedics;  Laterality: Right;   CATARACT EXTRACTION W/ INTRAOCULAR LENS IMPLANT Bilateral 2015   CHOLECYSTECTOMY     EUS N/A 05/31/2012   Procedure: UPPER ENDOSCOPIC ULTRASOUND (EUS) LINEAR;  Surgeon: Rachael Fee, MD;  Location: [facility];  Service: Endoscopy;  Laterality: N/A;   EXCISIONAL HEMORRHOIDECTOMY     JOINT REPLACEMENT Bilateral    KNEE ARTHROSCOPY WITH LATERAL MENISECTOMY Right 07/07/2015   Procedure: KNEE ARTHROSCOPY WITH LATERAL MENISECTOMY, PARTIAL SYNOVECTOMY;  Surgeon: Kennedy Bucker, MD;  Location: [facility];  Service: Orthopedics;  Laterality: Right;   LUMBAR LAMINECTOMY     PORTA CATH INSERTION N/A 01/07/2019   Procedure: PORTA CATH INSERTION;  Surgeon: Annice Needy, MD;  Location: [facility];  Service:  Cardiovascular;  Laterality: N/A;   REDUCTION MAMMAPLASTY  1990   RIGHT OOPHORECTOMY     TOTAL HIP ARTHROPLASTY Left 05/01/2014   Dr. Sheppard Evens   TOTAL HIP ARTHROPLASTY Right 08/05/2014   Procedure: TOTAL HIP ARTHROPLASTY ANTERIOR APPROACH;  Surgeon: Kennedy Bucker, MD;  Location: [facility];  Service: Orthopedics;  Laterality: Right;   ULNAR NERVE TRANSPOSITION Right 02/11/2016   Procedure: ULNAR NERVE DECOMPRESSION/TRANSPOSITION;  Surgeon: Kennedy Bucker, MD;  Location: [facility];  Service: Orthopedics;  Laterality: Right;   VIDEO BRONCHOSCOPY WITH ENDOBRONCHIAL ULTRASOUND Left 12/19/2018   Procedure: VIDEO BRONCHOSCOPY WITH ENDOBRONCHIAL ULTRASOUND, LEFT, SLEEP APNEA;  Surgeon: Vida Rigger, MD;  Location: [facility];  Service: Thoracic;   Laterality: Left;   VIDEO BRONCHOSCOPY WITH ENDOBRONCHIAL ULTRASOUND Right 05/14/2021   Procedure: VIDEO BRONCHOSCOPY WITH ENDOBRONCHIAL ULTRASOUND;  Surgeon: Salena Saner, MD;  Location: [facility];  Service: Pulmonary;  Laterality: Right;    Social History   Socioeconomic History   Marital status: Married    Spouse name: Not on file   Number of children: Not on file   Years of education: Not on file   Highest education level: Not on file  Occupational History   Not on file  Tobacco Use   Smoking status: Former    Current packs/day: 0.00    Average packs/day: 1 pack/day for 40.0 years (40.0 ttl pk-yrs)    Types: Cigarettes    Start date: 06/13/1967    Quit date: 06/13/2007    Years since quitting: 15.6   Smokeless tobacco: Never  Vaping Use   Vaping status: Never Used  Substance and Sexual Activity   Alcohol use: No    Alcohol/week: 0.0 standard drinks of alcohol   Drug use: No   Sexual activity: Yes  Other Topics Concern   Not on file  Social History Narrative   Not on file   Social Determinants of Health   Financial Resource Strain: Not on file  Food Insecurity: No Food Insecurity (08/08/2022)   Hunger Vital Sign    Worried About Running Out of Food in the Last Year: Never true    Ran Out of Food in the Last Year: Never true  Transportation Needs: No Transportation Needs (08/08/2022)   PRAPARE - Administrator, Civil Service (Medical): No    Lack of Transportation (Non-Medical): No  Physical Activity: Not on file  Stress: Not on file  Social Connections: Not on file  Intimate Partner Violence: Not At Risk (08/08/2022)   Humiliation, Afraid, Rape, and Kick questionnaire    Fear of Current or Ex-Partner: No    Emotionally Abused: No    Physically Abused: No    Sexually Abused: No    Family History  Problem Relation Age of Onset   Heart disease Mother        s/p stent   Hypertension Mother    Hypercholesterolemia Mother    Diabetes Father     Stomach cancer Other        uncle   Breast cancer Cousin    Breast cancer Paternal Aunt      Current Outpatient Medications:    acetaminophen (TYLENOL) 500 MG tablet, Take 500 mg by mouth 2 (two) times daily., Disp: , Rfl:    amiodarone (PACERONE) 400 MG tablet, Take by mouth., Disp: , Rfl:    apixaban (ELIQUIS) 5 MG TABS tablet, Take 1 tablet (5 mg total) by mouth 2 (two) times daily., Disp: 60 tablet, Rfl: 0   benzonatate (  TESSALON) 100 MG capsule, Take 1 capsule (100 mg total) by mouth 3 (three) times daily as needed for cough., Disp: 60 capsule, Rfl: 0   Bioflavonoid Products (ESTER C PO), Take 1 tablet by mouth daily. 500 mg, Disp: , Rfl:    Calcium-Magnesium-Zinc (CAL-MAG-ZINC PO), Take 1 tablet by mouth daily., Disp: , Rfl:    cyclobenzaprine (FLEXERIL) 10 MG tablet, Take 10 mg by mouth at bedtime., Disp: , Rfl:    ezetimibe (ZETIA) 10 MG tablet, Take 5 mg by mouth in the morning and at bedtime., Disp: , Rfl:    glycerin adult 2 g suppository, Place 1 suppository rectally as needed for constipation., Disp: , Rfl:    Histamine Dihydrochloride (AUSTRALIAN DREAM ARTHRITIS) 0.025 % CREA, Apply 1 application  topically 4 (four) times daily as needed (pain)., Disp: , Rfl:    HYDROcodone-acetaminophen (NORCO/VICODIN) 5-325 MG tablet, Take 1 tablet by mouth at bedtime., Disp: 30 tablet, Rfl: 0   ipratropium-albuterol (DUONEB) 0.5-2.5 (3) MG/3ML SOLN, Take 3 mLs by nebulization every 6 (six) hours as needed., Disp: 360 mL, Rfl: 0   loratadine (CLARITIN) 10 MG tablet, Take 10 mg by mouth daily., Disp: , Rfl:    ondansetron (ZOFRAN) 8 MG tablet, Take 1 tablet (8 mg total) by mouth every 8 (eight) hours as needed for nausea or vomiting., Disp: 20 tablet, Rfl: 0   pantoprazole (PROTONIX) 40 MG tablet, Take 40 mg by mouth 2 (two) times daily., Disp: , Rfl:    Polyethyl Glyc-Propyl Glyc PF (SYSTANE HYDRATION PF) 0.4-0.3 % SOLN, Place 1 drop into both eyes as needed. as, Disp: , Rfl:    polyethylene  glycol powder (GLYCOLAX/MIRALAX) 17 GM/SCOOP powder, Take 17 g by mouth daily as needed for moderate constipation., Disp: , Rfl:    Potassium Chloride ER 20 MEQ TBCR, TAKE 1 TABLET BY MOUTH DAILY, Disp: 30 tablet, Rfl: 0   potassium chloride SA (KLOR-CON M) 20 MEQ tablet, Take 1 tablet (20 mEq total) by mouth 2 (two) times daily., Disp: 10 tablet, Rfl: 0   Sennosides (SENNA) 8.6 MG CAPS, Take 1 capsule by mouth daily as needed (constipation)., Disp: , Rfl:    spironolactone (ALDACTONE) 50 MG tablet, Hold until followup with outpatient provider due to acute kidney injury., Disp: , Rfl:    torsemide (DEMADEX) 20 MG tablet, Hold until followup with outpatient provider due to acute kidney injury., Disp: , Rfl:    triamcinolone (NASACORT) 55 MCG/ACT AERO nasal inhaler, Place 2 sprays into the nose daily as needed., Disp: , Rfl:    UBRELVY 100 MG TABS, Take 100 mg by mouth daily as needed (migraines)., Disp: , Rfl:    Wheat Dextrin (EQ FIBER POWDER PO), Take 1 Dose by mouth as needed., Disp: , Rfl:    metoprolol tartrate (LOPRESSOR) 25 MG tablet, Take 0.5 tablets (12.5 mg total) by mouth 2 (two) times daily., Disp: 30 tablet, Rfl: 2  Physical exam:  Vitals:   01/23/23 1405  BP: (!) 151/87  Pulse: 98  Resp: 17  Temp: 97.8 F (36.6 C)  SpO2: 90%  Weight: 159 lb 12.8 oz (72.5 kg)   Physical Exam Constitutional:      Comments: She is sitting in a wheelchair and appears frail and fatigued  Cardiovascular:     Rate and Rhythm: Normal rate and regular rhythm.     Heart sounds: Normal heart sounds.  Pulmonary:     Effort: Pulmonary effort is normal.     Comments: Breath sounds decreased bilaterally  diffusely Abdominal:     General: Bowel sounds are normal.     Palpations: Abdomen is soft.  Skin:    General: Skin is warm and dry.  Neurological:     Mental Status: She is alert and oriented to person, place, and time.         Latest Ref Rng & Units 12/13/2022    9:06 AM  CMP  Glucose 70 -  99 mg/dL 161   BUN 8 - 23 mg/dL 22   Creatinine 0.96 - 1.00 mg/dL 0.45   Sodium 409 - 811 mmol/L 137   Potassium 3.5 - 5.1 mmol/L 4.4   Chloride 98 - 111 mmol/L 90   CO2 22 - 32 mmol/L 35   Calcium 8.9 - 10.3 mg/dL 9.2       Latest Ref Rng & Units 01/23/2023    2:55 PM  CBC  WBC 4.0 - 10.5 K/uL 8.4   Hemoglobin 12.0 - 15.0 g/dL 91.4   Hematocrit 78.2 - 46.0 % 35.8   Platelets 150 - 400 K/uL 422     No images are attached to the encounter.  CT CHEST ABDOMEN PELVIS W CONTRAST  Result Date: 01/20/2023 CLINICAL DATA:  Restaging adenocarcinoma of the lung. * Tracking Code: BO * EXAM: CT CHEST, ABDOMEN, AND PELVIS WITH CONTRAST TECHNIQUE: Multidetector CT imaging of the chest, abdomen and pelvis was performed following the standard protocol during bolus administration of intravenous contrast. RADIATION DOSE REDUCTION: This exam was performed according to the departmental dose-optimization program which includes automated exposure control, adjustment of the mA and/or kV according to patient size and/or use of iterative reconstruction technique. CONTRAST:  OMNIPAQUE IOHEXOL 300 MG/ML  SOLN COMPARISON:  PET-CT 10/03/2022 and CT angio chest from 10/20/2022. FINDINGS: CT CHEST FINDINGS Cardiovascular: The heart size appears normal. There is a small to moderate pericardial effusion with new pericardial enhancement. This measures up to 1.8 cm over the left apex compared with 1.1 cm previously. Aortic atherosclerotic calcifications. Coronary artery calcifications. Mediastinum/Nodes: No enlarged mediastinal, hilar, or axillary lymph nodes. The index low right paratracheal lymph node measures 5 mm, image 18/2. On the previous PET-CT this measured 4 mm and had an SUV max of 4.35. Thyroid gland, trachea, and esophagus demonstrate no significant findings. Lungs/Pleura: Increased volume of left pleural effusion which is now moderate with signs of early loculation. Trace scratch set small volume of loculated  right pleural fluid is similar in volume to the previous exam. Bandlike area of perihilar and paramediastinal right lung radiation changes identified, image 61/4. Similar appearance of left lung perihilar and paramediastinal radiation change. Index tracer avid nodules from the PET-CT dated 10/03/2022 include: -Index nodule within the lateral aspect of the superior segment of right lower lobe measures 1.6 cm, image 78/4. On the PET-CT from 10/03/2022 this nodule measured 1.3 cm and had an SUV max of 5.89. -Posteromedial subpleural nodule in the right lower lobe measures 0.9 cm, image 85/4. Previously 0.8 cm with SUV max 4.06. -Posterior right lung base nodule measures 0.9 cm, image 103/4. On the previous PET-CT this measured 0.9 cm. -nodule within the posteromedial right lower lobe measures 6 mm, image 79/4. Previously this measured the same. No new lung nodules. Musculoskeletal: No chest wall mass or suspicious bone lesions identified. CT ABDOMEN PELVIS FINDINGS Hepatobiliary: No focal liver abnormality is seen. Status post cholecystectomy. Common bile duct measures 1 cm, image 37/5. Mild intrahepatic bile duct dilatation. Pancreas: Unremarkable. No pancreatic ductal dilatation or surrounding inflammatory changes. Spleen:  Normal in size without focal abnormality. Adrenals/Urinary Tract: Adrenal glands are unremarkable. Punctate stone noted within inferior pole of the right kidney. No kidney mass or hydronephrosis. Bladder is largely obscured by artifact from bilateral hip prostheses. Stomach/Bowel: Stomach is within normal limits. No evidence of bowel wall thickening, distention, or inflammatory changes. Vascular/Lymphatic: Aortic atherosclerosis. Patent abdominal vascularity. No signs abdominopelvic adenopathy. Streak artifact limits assessment of bilateral pelvic sidewall lymph nodes. Reproductive: No mass identified. Other: No free fluid or fluid collections. No signs of pneumoperitoneum. Musculoskeletal:  Bilateral hip arthroplasty. No aggressive lytic or sclerotic bone lesions identified. IMPRESSION: 1. Increased volume of left pleural effusion which is now moderate with signs of early loculation. Consider further evaluation with diagnostic thoracentesis to exclude underlying malignant effusion. 2. Previous tracer avid nodule within the periphery of the superior segment of right lower lobe is mildly increased in size compared with the previous exam. This measures 1.6 cm on today's study versus 1.3 cm previously. 3. Unchanged index low right paratracheal lymph node measures 5 mm. On the previous PET-CT this measured 4 mm and had an SUV max of 4.35. 4. Small to moderate pericardial effusion. This measures up to 1.8 cm over the left apex compared with 1.1 cm previously. There is new enhancement of the overlying pericardium which may be seen in the setting of pericarditis but also malignant pericardial effusion. 5. No signs of metastatic disease within the abdomen or pelvis. 6. Status post cholecystectomy with mild intrahepatic and extrahepatic bile duct dilatation. This is nonspecific and in the absence of any clinical signs or symptoms of biliary obstruction likely reflects post cholecystectomy physiology. 7.  Aortic Atherosclerosis (ICD10-I70.0). Electronically Signed   By: Signa Kell M.D.   On: 01/20/2023 13:47     Assessment and plan- Patient is a 77 y.o. female with history of locally recurrent non-small cell lung cancer adenocarcinoma.  She is here to discuss CT scan results and further management  Patient was on maintenance Alimta up until September 2024 for locally recurrent non-small cell adenocarcinoma.  However given progressive anemia as well as concern for fatigue Alimta was stopped.  Her repeat hemoglobin today is improved at 11.2.  However patient reports worsening fatigue and shortness of breath.  I have reviewed CT chest abdomen and pelvis images independently and discussed findings with the  patient which does not show any evidence of distant metastatic disease.  Her lung nodule and mediastinal adenopathy is overall stable.  She does have a large left pleural effusion and I will plan for thoracentesis tomorrow.  She has had at least 3 cytologies checked on her pleural fluid in the past and they have not been positive for malignancy.  CT scan also shows small to moderate pleural effusion again which is chronic and has not required any drainage.  There has been a concern for pericardial effusion to be malignant but this has not been proven.  We will reach out to Dr. Halina Maidens team and we will consider getting echocardiogram in the near future.  Overall given patient's poor performance status it is unlikely that she would need pericardiocentesis unless effusion is large.  I discussed overall goals of care with patient and daughter.  Even if there is no overt evidence of disease progression as far as her lung cancer is concerned, her performance status has continued to go down also partly due to her chronic lung disease.  We discussed consideration for hospice.  Patient would like to continue monitoring at this time and consider  hospice down the line but she is not quite ready yet.  I will see her back in 1 month with chest x-ray prior for possible thoracentesis   Visit Diagnosis 1. Malignant neoplasm of upper lobe of left lung (HCC)   2. Anemia, unspecified type   3. SOB (shortness of breath)   4. Cough, unspecified type      Dr. Owens Shark, MD, MPH [facility] [phone] 01/23/2023 3:56 PM

## 2023-01-24 ENCOUNTER — Other Ambulatory Visit: Payer: Self-pay | Admitting: Oncology

## 2023-01-24 ENCOUNTER — Ambulatory Visit
Admission: RE | Admit: 2023-01-24 | Discharge: 2023-01-24 | Disposition: A | Discharge status: Home / Self Care | Payer: Medicare Other | Source: Ambulatory Visit | Attending: Oncology | Admitting: Oncology

## 2023-01-24 ENCOUNTER — Other Ambulatory Visit: Payer: Self-pay

## 2023-01-24 ENCOUNTER — Telehealth: Payer: Self-pay | Admitting: *Deleted

## 2023-01-24 ENCOUNTER — Other Ambulatory Visit: Payer: Self-pay | Admitting: *Deleted

## 2023-01-24 DIAGNOSIS — R059 Cough, unspecified: Secondary | ICD-10-CM

## 2023-01-24 DIAGNOSIS — Z9889 Other specified postprocedural states: Secondary | ICD-10-CM | POA: Diagnosis not present

## 2023-01-24 DIAGNOSIS — Z85118 Personal history of other malignant neoplasm of bronchus and lung: Secondary | ICD-10-CM | POA: Diagnosis not present

## 2023-01-24 DIAGNOSIS — C3412 Malignant neoplasm of upper lobe, left bronchus or lung: Secondary | ICD-10-CM

## 2023-01-24 DIAGNOSIS — D649 Anemia, unspecified: Secondary | ICD-10-CM

## 2023-01-24 DIAGNOSIS — J90 Pleural effusion, not elsewhere classified: Secondary | ICD-10-CM

## 2023-01-24 DIAGNOSIS — R0602 Shortness of breath: Secondary | ICD-10-CM

## 2023-01-24 DIAGNOSIS — Z7189 Other specified counseling: Secondary | ICD-10-CM

## 2023-01-24 LAB — PROTEIN, PLEURAL OR PERITONEAL FLUID: Total protein, fluid: 3.3 g/dL

## 2023-01-24 LAB — LACTATE DEHYDROGENASE, PLEURAL OR PERITONEAL FLUID: LD, Fluid: 195 U/L — ABNORMAL HIGH (ref 3–23)

## 2023-01-24 MED ORDER — LIDOCAINE HCL (PF) 1 % IJ SOLN
10.0000 mL | Freq: Once | INTRAMUSCULAR | Status: AC
  Administered 2023-01-24: 10 mL via INTRADERMAL
  Filled 2023-01-24: qty 10

## 2023-01-24 NOTE — Procedures (Signed)
PROCEDURE SUMMARY:  Successful US guided left thoracentesis. Yielded 1.3 L of amber-colored fluid. Pt tolerated procedure well. No immediate complications.  Specimen sent for labs. CXR ordered; no post-procedure pneumothorax identified.   EBL < 2 mL  Mickie Kay, NP 01/24/2023 1:56 PM

## 2023-01-24 NOTE — Telephone Encounter (Signed)
Message left with Dr. Glennis Brink office regarding new finding of pericardium enhancement found on recent CT scan. His office will call back to offer further recommendations on how to follow up/manage finding. Awaiting callback.

## 2023-01-25 LAB — CYTOLOGY - NON PAP

## 2023-01-27 LAB — BODY FLUID CULTURE W GRAM STAIN
Culture: NO GROWTH
Gram Stain: NONE SEEN

## 2023-01-27 NOTE — Telephone Encounter (Signed)
Received message from Dr. Glennis Brink office that Dr. Juliann Pares will be ordering ECHO and follow up to further workup abnormal cardiac finding on recent imaging.

## 2023-01-31 ENCOUNTER — Encounter: Payer: Self-pay | Admitting: *Deleted

## 2023-02-11 ENCOUNTER — Other Ambulatory Visit: Payer: Self-pay | Admitting: Oncology

## 2023-02-13 ENCOUNTER — Encounter: Payer: Self-pay | Admitting: Oncology

## 2023-02-13 NOTE — Telephone Encounter (Signed)
Component Ref Range & Units 2 mo ago (12/13/22) 2 mo ago (12/08/22) 2 mo ago (12/05/22) 3 mo ago (11/15/22) 3 mo ago (10/24/22) 4 mo ago (10/10/22) 4 mo ago (09/22/22)  Sodium 135 - 145 mmol/L 137 133 Low  133 Low  135 136 132 Low  136  Potassium 3.5 - 5.1 mmol/L 4.4 2.6 Low Panic  CM 2.5 Low Panic  CM 3.7 3.5 3.6 3.9

## 2023-02-21 ENCOUNTER — Other Ambulatory Visit: Payer: Medicare Other

## 2023-02-21 ENCOUNTER — Ambulatory Visit: Payer: Medicare Other | Admitting: Oncology

## 2023-02-21 ENCOUNTER — Inpatient Hospital Stay (HOSPITAL_BASED_OUTPATIENT_CLINIC_OR_DEPARTMENT_OTHER): Admitting: Hospice and Palliative Medicine

## 2023-02-21 ENCOUNTER — Inpatient Hospital Stay: Attending: Oncology

## 2023-02-21 ENCOUNTER — Inpatient Hospital Stay (HOSPITAL_BASED_OUTPATIENT_CLINIC_OR_DEPARTMENT_OTHER): Admitting: Oncology

## 2023-02-21 ENCOUNTER — Inpatient Hospital Stay

## 2023-02-21 ENCOUNTER — Other Ambulatory Visit: Payer: Self-pay

## 2023-02-21 VITALS — BP 115/80 | HR 83 | Temp 97.2°F | Ht 61.0 in | Wt 151.0 lb

## 2023-02-21 DIAGNOSIS — Z7189 Other specified counseling: Secondary | ICD-10-CM | POA: Diagnosis not present

## 2023-02-21 DIAGNOSIS — G4733 Obstructive sleep apnea (adult) (pediatric): Secondary | ICD-10-CM | POA: Insufficient documentation

## 2023-02-21 DIAGNOSIS — G893 Neoplasm related pain (acute) (chronic): Secondary | ICD-10-CM | POA: Diagnosis not present

## 2023-02-21 DIAGNOSIS — J9611 Chronic respiratory failure with hypoxia: Secondary | ICD-10-CM | POA: Diagnosis not present

## 2023-02-21 DIAGNOSIS — C3412 Malignant neoplasm of upper lobe, left bronchus or lung: Secondary | ICD-10-CM

## 2023-02-21 DIAGNOSIS — R52 Pain, unspecified: Secondary | ICD-10-CM | POA: Diagnosis not present

## 2023-02-21 DIAGNOSIS — Z95828 Presence of other vascular implants and grafts: Secondary | ICD-10-CM

## 2023-02-21 DIAGNOSIS — R110 Nausea: Secondary | ICD-10-CM | POA: Insufficient documentation

## 2023-02-21 DIAGNOSIS — Z515 Encounter for palliative care: Secondary | ICD-10-CM | POA: Insufficient documentation

## 2023-02-21 DIAGNOSIS — D649 Anemia, unspecified: Secondary | ICD-10-CM

## 2023-02-21 DIAGNOSIS — R0602 Shortness of breath: Secondary | ICD-10-CM

## 2023-02-21 DIAGNOSIS — Z452 Encounter for adjustment and management of vascular access device: Secondary | ICD-10-CM | POA: Insufficient documentation

## 2023-02-21 DIAGNOSIS — J90 Pleural effusion, not elsewhere classified: Secondary | ICD-10-CM | POA: Insufficient documentation

## 2023-02-21 DIAGNOSIS — R059 Cough, unspecified: Secondary | ICD-10-CM

## 2023-02-21 LAB — BASIC METABOLIC PANEL
Anion gap: 11 (ref 5–15)
BUN: 18 mg/dL (ref 8–23)
CO2: 31 mmol/L (ref 22–32)
Calcium: 9.3 mg/dL (ref 8.9–10.3)
Chloride: 94 mmol/L — ABNORMAL LOW (ref 98–111)
Creatinine, Ser: 0.8 mg/dL (ref 0.44–1.00)
GFR, Estimated: >60 mL/min (ref >60)
Glucose, Bld: 135 mg/dL — ABNORMAL HIGH (ref 70–99)
Potassium: 3.9 mmol/L (ref 3.5–5.1)
Sodium: 136 mmol/L (ref 135–145)

## 2023-02-21 LAB — CBC
HCT: 35.7 % — ABNORMAL LOW (ref 36.0–46.0)
Hemoglobin: 11.1 g/dL — ABNORMAL LOW (ref 12.0–15.0)
MCH: 27.2 pg (ref 26.0–34.0)
MCHC: 31.1 g/dL (ref 30.0–36.0)
MCV: 87.5 fL (ref 80.0–100.0)
Platelets: 327 10*3/uL (ref 150–400)
RBC: 4.08 MIL/uL (ref 3.87–5.11)
RDW: 15.9 % — ABNORMAL HIGH (ref 11.5–15.5)
WBC: 6 10*3/uL (ref 4.0–10.5)
nRBC: 0 % (ref 0.0–0.2)

## 2023-02-21 MED ORDER — HEPARIN SOD (PORK) LOCK FLUSH 100 UNIT/ML IV SOLN
500.0000 [IU] | Freq: Once | INTRAVENOUS | Status: AC
Start: 2023-02-21 — End: 2023-02-21
  Administered 2023-02-21: 500 [IU]
  Filled 2023-02-21: qty 5

## 2023-02-21 MED ORDER — HYDROCODONE-ACETAMINOPHEN 5-325 MG PO TABS: 1.0000 | ORAL_TABLET | Freq: Four times a day (QID) | ORAL | 0 refills | Status: DC | PRN

## 2023-02-21 MED ORDER — SODIUM CHLORIDE 0.9% FLUSH
10.0000 mL | Freq: Once | INTRAVENOUS | Status: AC
  Administered 2023-02-21: 10 mL via INTRAVENOUS
  Filled 2023-02-21: qty 10

## 2023-02-21 NOTE — Progress Notes (Signed)
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  EGFR and Ros as well as MET testing negative. PD-L1 was 50%   Patient completed concurrent chemoradiation with weekly carbotaxol chemotherapy on 03/12/2019.  Maintenance durvalumab started in January 2021 after scan showed partial response.  Patient completed 1 year of maintenance durvalumab in February 2022   Patient was found to have enlarging mediastinal adenopathyOn subsequent CT scan in January 2023.  This was followed by a PET scan which showed hypermetabolic mediastinal and right hilar lymph nodes but no other evidence of disease elsewhere.  This was biopsied and was consistent with non-small cell lung cancer specifically adenocarcinoma.  Cells were positive for TTF-1 and Napsin A and negative for p40.   Patient completed concurrent chemoradiation with 4 cycles of carbo Alimta ending in May 2023.  Scan showed partial response.  Plan is for maintenance Alimta until progression or toxicity.  Alimta was stopped in November 2023 due to progressive anemia and stable scans   Patient was hospitalized in November 2023 for acute on  chronic hypoxic respiratory failure secondary to healthcare associated pneumonia.  She had pleural effusions which were tapped and thoracentesis was negative for malignancy.   Scans in March 2024 showed concern for progressive disease with appearance of new right lung nodules as well as progressive mediastinal adenopathy.  Also noted to have right pleural effusion which was tapped and was negative for malignancy.  Single agent Alimta was restarted    Interval history-patient has been gradually feeling worse over the last few months.  Despite having a fair appetite she has continued to lose weight.  Also feels fatigued all the time and is unable to carry out ADLs without taking treatment breaks due to her shortness of breath.  She was given hydrocodone to help her with sleep and her pain which she takes at once a night.  She is concerned that someone is stealing her pain medications.  She has been working with her care hospice  ECOG PS- 3 Pain scale- 5 Opioid associated constipation- no  Review of systems- Review of Systems  Constitutional:  Positive for malaise/fatigue and weight loss. Negative for chills and fever.  HENT:  Negative for congestion, ear discharge and nosebleeds.   Eyes:  Negative for blurred vision.  Respiratory:  Positive for shortness of breath. Negative for cough, hemoptysis, sputum production and wheezing.   Cardiovascular:  Negative for chest pain, palpitations, orthopnea and claudication.  Gastrointestinal:  Negative for abdominal pain, blood in stool, constipation, diarrhea, heartburn, melena, nausea and vomiting.  Genitourinary:  Negative for dysuria, flank pain, frequency, hematuria and urgency.  Musculoskeletal:  Negative for back pain, joint pain and myalgias.  Skin:  Negative for rash.  Neurological:  Negative for  dizziness, tingling, focal weakness, seizures, weakness and headaches.  Endo/Heme/Allergies:  Does not bruise/bleed easily.  Psychiatric/Behavioral:   Negative for depression and suicidal ideas. The patient does not have insomnia.       Allergies  Allergen Reactions   Aspirin Hives, Shortness Of Breath and Other (See Comments)    Difficulty breathing   Celebrex [Celecoxib] Shortness Of Breath   Morphine And Codeine Nausea And Vomiting and Swelling   Adhesive [Tape] Other (See Comments)    Took top layer of skin off when removed.   Clarithromycin Nausea And Vomiting   Codeine Nausea And Vomiting   Darvon [Propoxyphene Hcl] Nausea And Vomiting   Demerol [Meperidine] Nausea And Vomiting   Flonase [Fluticasone Propionate] Other (See Comments)    Fungal infection in nose   Simvastatin Other (See Comments)    "caused ulcers in mouth, and fever"   Talwin [Pentazocine] Nausea And Vomiting     Past Medical History:  Diagnosis Date   Anemia    Asthma    No Inhalers--Dr. Meredeth Ide will order as needed   Bronchiectasis (HCC)    mild   Chronic headaches     followed by [facility] migraines   COPD (chronic obstructive pulmonary disease) (HCC)    DDD (degenerative disc disease), lumbar    Diverticulosis    Family history of adverse reaction to anesthesia    mother and sisters-PONV   Gall stones    history of   GERD (gastroesophageal reflux disease)    EGD 8/09- non bleeding erosive gastritis, documentd esophageal ulcerations.    Hiatal hernia    small   History of kidney stones    History of pneumonia    Hypercholesterolemia    IBS (irritable bowel syndrome)    Malignant neoplasm of upper lobe of left lung (HCC) 12/27/2018   Meniere disease    Murmur    Osteoarthritis    lumbar disc disease, left hip   Personal history of chemotherapy    Personal history of radiation therapy    Pneumonia 12/2020   PONV (postoperative nausea and vomiting)    Sleep apnea    uses cpap   Vertigo    Weakness of right side of body      Past Surgical History:  Procedure Laterality Date   ABDOMINAL HYSTERECTOMY  age 37   ANTERIOR  CERVICAL DECOMP/DISCECTOMY FUSION N/A 02/24/2015   Procedure: CERVICAL FOUR-FIVE, CERVICAL FIVE-SIX, CERVICAL SIX-SEVEN ANTERIOR CERVICAL DECOMPRESSION/DISCECTOMY FUSION ;  Surgeon: Lisbeth Renshaw, MD;  Location: [facility];  Service: Neurosurgery;  Laterality: N/A;  C45 C56 C67 anterior cervical decompression with fusion interbody prosthesis plating and bonegraft   APPENDECTOMY     BACK SURGERY  1988   4th lumbar fusion   BLADDER SURGERY N/A    with vaginal wall repair   BREAST CYST ASPIRATION Bilateral    neg   BREAST SURGERY Bilateral    cyst removed and reduction   CARDIAC CATHETERIZATION  2014   CARPAL TUNNEL RELEASE Right 02/11/2016   Procedure: CARPAL TUNNEL RELEASE;  Surgeon: Kennedy Bucker, MD;  Location: [facility];  Service: Orthopedics;  Laterality: Right;   CATARACT EXTRACTION W/ INTRAOCULAR LENS IMPLANT Bilateral 2015   CHOLECYSTECTOMY     EUS N/A 05/31/2012   Procedure: UPPER ENDOSCOPIC ULTRASOUND (EUS) LINEAR;  Surgeon: Rachael Fee, MD;  Location: [facility];  Service: Endoscopy;  Laterality: N/A;   EXCISIONAL HEMORRHOIDECTOMY     JOINT REPLACEMENT Bilateral    KNEE ARTHROSCOPY WITH LATERAL MENISECTOMY Right  07/07/2015   Procedure: KNEE ARTHROSCOPY WITH LATERAL MENISECTOMY, PARTIAL SYNOVECTOMY;  Surgeon: Kennedy Bucker, MD;  Location: [facility];  Service: Orthopedics;  Laterality: Right;   LUMBAR LAMINECTOMY     PORTA CATH INSERTION N/A 01/07/2019   Procedure: PORTA CATH INSERTION;  Surgeon: Annice Needy, MD;  Location: [facility];  Service: Cardiovascular;  Laterality: N/A;   REDUCTION MAMMAPLASTY  1990   RIGHT OOPHORECTOMY     TOTAL HIP ARTHROPLASTY Left 05/01/2014   Dr. Sheppard Evens   TOTAL HIP ARTHROPLASTY Right 08/05/2014   Procedure: TOTAL HIP ARTHROPLASTY ANTERIOR APPROACH;  Surgeon: Kennedy Bucker, MD;  Location: [facility];  Service: Orthopedics;  Laterality: Right;   ULNAR NERVE TRANSPOSITION Right 02/11/2016   Procedure: ULNAR NERVE  DECOMPRESSION/TRANSPOSITION;  Surgeon: Kennedy Bucker, MD;  Location: [facility];  Service: Orthopedics;  Laterality: Right;   VIDEO BRONCHOSCOPY WITH ENDOBRONCHIAL ULTRASOUND Left 12/19/2018   Procedure: VIDEO BRONCHOSCOPY WITH ENDOBRONCHIAL ULTRASOUND, LEFT, SLEEP APNEA;  Surgeon: Vida Rigger, MD;  Location: [facility];  Service: Thoracic;  Laterality: Left;   VIDEO BRONCHOSCOPY WITH ENDOBRONCHIAL ULTRASOUND Right 05/14/2021   Procedure: VIDEO BRONCHOSCOPY WITH ENDOBRONCHIAL ULTRASOUND;  Surgeon: Salena Saner, MD;  Location: [facility];  Service: Pulmonary;  Laterality: Right;    Social History   Socioeconomic History   Marital status: Married    Spouse name: Not on file   Number of children: Not on file   Years of education: Not on file   Highest education level: Not on file  Occupational History   Not on file  Tobacco Use   Smoking status: Former    Current packs/day: 0.00    Average packs/day: 1 pack/day for 40.0 years (40.0 ttl pk-yrs)    Types: Cigarettes    Start date: 06/13/1967    Quit date: 06/13/2007    Years since quitting: 15.7   Smokeless tobacco: Never  Vaping Use   Vaping status: Never Used  Substance and Sexual Activity   Alcohol use: No    Alcohol/week: 0.0 standard drinks of alcohol   Drug use: No   Sexual activity: Yes  Other Topics Concern   Not on file  Social History Narrative   Not on file   Social Determinants of Health   Financial Resource Strain: Not on file  Food Insecurity: No Food Insecurity (08/08/2022)   Hunger Vital Sign    Worried About Running Out of Food in the Last Year: Never true    Ran Out of Food in the Last Year: Never true  Transportation Needs: No Transportation Needs (08/08/2022)   PRAPARE - Administrator, Civil Service (Medical): No    Lack of Transportation (Non-Medical): No  Physical Activity: Not on file  Stress: Not on file  Social Connections: Not on file  Intimate Partner Violence: Not At Risk (08/08/2022)    Humiliation, Afraid, Rape, and Kick questionnaire    Fear of Current or Ex-Partner: No    Emotionally Abused: No    Physically Abused: No    Sexually Abused: No    Family History  Problem Relation Age of Onset   Heart disease Mother        s/p stent   Hypertension Mother    Hypercholesterolemia Mother    Diabetes Father    Stomach cancer Other        uncle   Breast cancer Cousin    Breast cancer Paternal Aunt      Current Outpatient Medications:    acetaminophen (  TYLENOL) 500 MG tablet, Take 500 mg by mouth 2 (two) times daily., Disp: , Rfl:    amiodarone (PACERONE) 400 MG tablet, Take 400 mg by mouth daily., Disp: , Rfl:    apixaban (ELIQUIS) 5 MG TABS tablet, Take 1 tablet (5 mg total) by mouth 2 (two) times daily., Disp: 60 tablet, Rfl: 0   benzonatate (TESSALON) 100 MG capsule, Take 1 capsule (100 mg total) by mouth 3 (three) times daily as needed for cough., Disp: 60 capsule, Rfl: 0   Bioflavonoid Products (ESTER C PO), Take 1 tablet by mouth daily. 500 mg, Disp: , Rfl:    Calcium-Magnesium-Zinc (CAL-MAG-ZINC PO), Take 1 tablet by mouth daily., Disp: , Rfl:    cyclobenzaprine (FLEXERIL) 10 MG tablet, Take 10 mg by mouth at bedtime., Disp: , Rfl:    ezetimibe (ZETIA) 10 MG tablet, Take 5 mg by mouth in the morning and at bedtime., Disp: , Rfl:    glycerin adult 2 g suppository, Place 1 suppository rectally as needed for constipation., Disp: , Rfl:    Histamine Dihydrochloride (AUSTRALIAN DREAM ARTHRITIS) 0.025 % CREA, Apply 1 application  topically 4 (four) times daily as needed (pain)., Disp: , Rfl:    ipratropium-albuterol (DUONEB) 0.5-2.5 (3) MG/3ML SOLN, Take 3 mLs by nebulization every 6 (six) hours as needed., Disp: 360 mL, Rfl: 0   loratadine (CLARITIN) 10 MG tablet, Take 10 mg by mouth daily., Disp: , Rfl:    pantoprazole (PROTONIX) 40 MG tablet, Take 40 mg by mouth 2 (two) times daily., Disp: , Rfl:    Polyethyl Glyc-Propyl Glyc PF (SYSTANE HYDRATION PF) 0.4-0.3 %  SOLN, Place 1 drop into both eyes as needed. as, Disp: , Rfl:    Potassium Chloride ER 20 MEQ TBCR, TAKE 1 TABLET BY MOUTH DAILY, Disp: 30 tablet, Rfl: 0   Sennosides (SENNA) 8.6 MG CAPS, Take 1 capsule by mouth daily as needed (constipation)., Disp: , Rfl:    spironolactone (ALDACTONE) 50 MG tablet, Hold until followup with outpatient provider due to acute kidney injury. (Patient taking differently: Take 50 mg by mouth once.), Disp: , Rfl:    torsemide (DEMADEX) 20 MG tablet, Hold until followup with outpatient provider due to acute kidney injury. (Patient taking differently: Take 20 mg by mouth once.), Disp: , Rfl:    triamcinolone (NASACORT) 55 MCG/ACT AERO nasal inhaler, Place 2 sprays into the nose daily as needed., Disp: , Rfl:    UBRELVY 100 MG TABS, Take 100 mg by mouth daily as needed (migraines)., Disp: , Rfl:    Wheat Dextrin (EQ FIBER POWDER PO), Take 1 Dose by mouth as needed., Disp: , Rfl:    HYDROcodone-acetaminophen (NORCO/VICODIN) 5-325 MG tablet, Take 1-2 tablets by mouth every 6 (six) hours as needed for moderate pain (pain score 4-6)., Disp: 30 tablet, Rfl: 0   metoprolol tartrate (LOPRESSOR) 25 MG tablet, Take 0.5 tablets (12.5 mg total) by mouth 2 (two) times daily. (Patient not taking: Reported on 02/21/2023), Disp: 30 tablet, Rfl: 2   ondansetron (ZOFRAN) 8 MG tablet, Take 1 tablet (8 mg total) by mouth every 8 (eight) hours as needed for nausea or vomiting. (Patient not taking: Reported on 02/21/2023), Disp: 20 tablet, Rfl: 0   polyethylene glycol powder (GLYCOLAX/MIRALAX) 17 GM/SCOOP powder, Take 17 g by mouth daily as needed for moderate constipation. (Patient not taking: Reported on 02/21/2023), Disp: , Rfl:   Physical exam:  Vitals:   02/21/23 1043  BP: 115/80  Pulse: 83  Temp: (!) 97.2 F (  36.2 C)  TempSrc: Tympanic  SpO2: 94%  Weight: 151 lb (68.5 kg)  Height: 5\' 1"  (1.549 m)   Physical Exam Cardiovascular:     Rate and Rhythm: Normal rate and regular rhythm.      Heart sounds: Normal heart sounds.  Pulmonary:     Comments: Effort of breathing increased.  Breath sounds decreased bilaterally over lung bases Abdominal:     General: Bowel sounds are normal.     Palpations: Abdomen is soft.  Skin:    General: Skin is warm and dry.  Neurological:     Mental Status: She is alert and oriented to person, place, and time.         Latest Ref Rng & Units 02/21/2023   10:34 AM  CMP  Glucose 70 - 99 mg/dL 161   BUN 8 - 23 mg/dL 18   Creatinine 0.96 - 1.00 mg/dL 0.45   Sodium 409 - 811 mmol/L 136   Potassium 3.5 - 5.1 mmol/L 3.9   Chloride 98 - 111 mmol/L 94   CO2 22 - 32 mmol/L 31   Calcium 8.9 - 10.3 mg/dL 9.3       Latest Ref Rng & Units 02/21/2023   10:34 AM  CBC  WBC 4.0 - 10.5 K/uL 6.0   Hemoglobin 12.0 - 15.0 g/dL 91.4   Hematocrit 78.2 - 46.0 % 35.7   Platelets 150 - 400 K/uL 327     No images are attached to the encounter.  US THORACENTESIS ASP PLEURAL SPACE W/IMG GUIDE  Result Date: 01/24/2023 INDICATION: Patient with a history of lung cancer presents today with a left pleural effusion. Diagnostic and therapeutic thoracentesis requested. EXAM: ULTRASOUND GUIDED THORACENTESIS MEDICATIONS: 1% lidocaine 10 mL COMPLICATIONS: None immediate. PROCEDURE: An ultrasound guided thoracentesis was thoroughly discussed with the patient and questions answered. The benefits, risks, alternatives and complications were also discussed. The patient understands and wishes to proceed with the procedure. Written consent was obtained. Ultrasound was performed to localize and mark an adequate pocket of fluid in the left chest. The area was then prepped and draped in the normal sterile fashion. 1% Lidocaine was used for local anesthesia. Under ultrasound guidance a 6 Fr Safe-T-Centesis catheter was introduced. Thoracentesis was performed. The catheter was removed and a dressing applied. FINDINGS: A total of approximately 1.2 L of amber colored fluid was  removed. Samples were sent to the laboratory as requested by the clinical team. IMPRESSION: Successful ultrasound guided left thoracentesis yielding 1.2 L of pleural fluid. Procedure performed by Alwyn Ren NP Electronically Signed   By: Marliss Coots M.D.   On: 01/24/2023 13:57   DG Chest Port 1 View  Result Date: 01/24/2023 CLINICAL DATA:  77 year old female status post left thoracentesis. History of lung cancer. EXAM: PORTABLE CHEST - 1 VIEW COMPARISON:  09/19/2022, 01/18/2023 FINDINGS: Unchanged cardiomediastinal silhouette. Unchanged position of right jugular vein single-lumen Port-A-Cath with catheter tip near the cavoatrial junction. Similar appearing extensive right greater than left perihilar scarring with blunting of the bilateral costophrenic angles, right greater than left. No evidence of left pneumothorax. No acute osseous abnormality. Lower cervical ACDF hardware in place. IMPRESSION: 1. No evidence of pneumothorax after left thoracentesis. Trace residual left pleural effusion. 2. Similar appearing right greater than left bilateral perihilar scarring. Electronically Signed   By: Marliss Coots M.D.   On: 01/24/2023 13:54     Assessment and plan- Patient is a 77 y.o. female with history of locally recurrent non-small cell adenocarcinoma of the  lung here to discuss further management  Patient was on maintenance Alimta until September 2024 and it was subsequently stopped due to worsening anemia fatigue as well as concern for kidney injury.  She had CT chest abdomen andPelvis with contrast in November 2024 which did not show any evidence of distant metastatic disease or locally progressive disease.  She does get chronic left-sided pleural effusions for which she has undergone thoracentesis in the past but all these have been negative for malignancy.  She was also noted to have some pericardial effusion for which she follows up with cardiology and again this has not been proven to be  malignant.  She does have lung nodules measuring about 1.5 cm which have not changed in size over the last 2 scans.  Overall there was no overt concern for disease progression and therefore no treatment was restarted.  Given her progressive decline in her performance status, patient's daughter wanted to get hospice involved.  Today patient affirms that she does not wish to undergo any future CT scans and wishes to focus on her quality of life and wants to continue with hospice.  We will reach out to [facility] care as well and make sure that she is enrolled in hospice.  I have encouraged her to use hydrocodone during the day as well to help her with pain and breathing.  She is hesitant to use morphine due to nausea associated with it.  She also does not wish to try Ativan at this time.  I will have her see NP Laurette Schimke from palliative care today as well.   Visit Diagnosis 1. Goals of care, counseling/discussion   2. Chronic hypoxic respiratory failure (HCC)   3. Malignant neoplasm of upper lobe of left lung (HCC)      Dr. Owens Shark, MD, MPH [facility] [phone] 02/21/2023 12:46 PM

## 2023-02-21 NOTE — Progress Notes (Signed)
Palliative Medicine [facility] at [facility] [phone] [phone]   Name: Jennifer Hodges Date: 02/21/2023 MRN: [id number]  DOB: [birth year]  Patient Care Team: Marina Goodell, MD as PCP - General (Family Medicine) Glory Buff, RN as Registered Nurse Carmina Miller, MD as Referring Physician (Radiation Oncology) Creig Hines, MD as Consulting Physician (Oncology)    REASON FOR CONSULTATION: Jennifer Hodges is a 77 y.o. 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 Patient on maintenance Alimta until November 2023 when treatment was held due to worsening anemia. She was restarted in April 2024 due to progressive disease.  Patient subsequently had declining performance status and was referred to hospice.  SOCIAL HISTORY:     reports that she quit smoking about 15 years ago. Her smoking use included cigarettes. She started smoking about 55 years ago. She has a 40 pack-year smoking history. She has never used smokeless tobacco. She reports that she does not drink alcohol and does not use drugs.  Patient lives at home alone.  Her daughter is involved.  ADVANCE DIRECTIVES:  Not on file  CODE STATUS: DNR  PAST MEDICAL HISTORY: Past Medical History:  Diagnosis Date   Anemia    Asthma    No Inhalers--Dr. Meredeth Ide will order as needed   Bronchiectasis (HCC)    mild   Chronic headaches     followed by [facility] migraines   COPD (chronic obstructive pulmonary disease) (HCC)    DDD (degenerative disc disease), lumbar    Diverticulosis    Family history of adverse reaction to anesthesia    mother and sisters-PONV   Gall stones    history of   GERD (gastroesophageal reflux disease)    EGD 8/09- non bleeding erosive gastritis, documentd esophageal ulcerations.    Hiatal hernia    small    History of kidney stones    History of pneumonia    Hypercholesterolemia    IBS (irritable bowel syndrome)    Malignant neoplasm of upper lobe of left lung (HCC) 12/27/2018   Meniere disease    Murmur    Osteoarthritis    lumbar disc disease, left hip   Personal history of chemotherapy    Personal history of radiation therapy    Pneumonia 12/2020   PONV (postoperative nausea and vomiting)    Sleep apnea    uses cpap   Vertigo    Weakness of right side of body     PAST SURGICAL HISTORY:  Past Surgical History:  Procedure Laterality Date   ABDOMINAL HYSTERECTOMY  age 4   ANTERIOR CERVICAL DECOMP/DISCECTOMY FUSION N/A 02/24/2015   Procedure: CERVICAL FOUR-FIVE, CERVICAL FIVE-SIX, CERVICAL SIX-SEVEN ANTERIOR CERVICAL DECOMPRESSION/DISCECTOMY FUSION ;  Surgeon: Lisbeth Renshaw, MD;  Location: [facility];  Service: Neurosurgery;  Laterality: N/A;  C45 C56 C67 anterior cervical decompression with fusion interbody prosthesis plating and bonegraft   APPENDECTOMY     BACK SURGERY  1988   4th lumbar fusion   BLADDER SURGERY N/A    with vaginal wall repair   BREAST CYST ASPIRATION Bilateral    neg   BREAST SURGERY Bilateral    cyst removed and reduction   CARDIAC CATHETERIZATION  2014   CARPAL TUNNEL RELEASE Right 02/11/2016   Procedure: CARPAL TUNNEL RELEASE;  Surgeon: Kennedy Bucker, MD;  Location: [facility] ORS;  Service: Orthopedics;  Laterality: Right;   CATARACT EXTRACTION W/ INTRAOCULAR LENS IMPLANT Bilateral 2015   CHOLECYSTECTOMY     EUS N/A 05/31/2012   Procedure: UPPER ENDOSCOPIC ULTRASOUND (EUS) LINEAR;  Surgeon: Rachael Fee, MD;  Location: [facility];  Service: Endoscopy;  Laterality: N/A;   EXCISIONAL HEMORRHOIDECTOMY     JOINT REPLACEMENT Bilateral    KNEE ARTHROSCOPY WITH LATERAL MENISECTOMY Right 07/07/2015   Procedure: KNEE ARTHROSCOPY WITH LATERAL MENISECTOMY, PARTIAL SYNOVECTOMY;  Surgeon: Kennedy Bucker, MD;  Location: [facility] OR[facility];  Service: Orthopedics;   Laterality: Right;   LUMBAR LAMINECTOMY     PORTA CATH INSERTION N/A 01/07/2019   Procedure: PORTA CATH INSERTION;  Surgeon: Annice Needy, MD;  Location: [facility];  Service: Cardiovascular;  Laterality: N/A;   REDUCTION MAMMAPLASTY  1990   RIGHT OOPHORECTOMY     TOTAL HIP ARTHROPLASTY Left 05/01/2014   Dr. Sheppard Evens   TOTAL HIP ARTHROPLASTY Right 08/05/2014   Procedure: TOTAL HIP ARTHROPLASTY ANTERIOR APPROACH;  Surgeon: Kennedy Bucker, MD;  Location: [facility] ORS;  Service: Orthopedics;  Laterality: Right;   ULNAR NERVE TRANSPOSITION Right 02/11/2016   Procedure: ULNAR NERVE DECOMPRESSION/TRANSPOSITION;  Surgeon: Kennedy Bucker, MD;  Location: [facility] OR[facility];  Service: Orthopedics;  Laterality: Right;   VIDEO BRONCHOSCOPY WITH ENDOBRONCHIAL ULTRASOUND Left 12/19/2018   Procedure: VIDEO BRONCHOSCOPY WITH ENDOBRONCHIAL ULTRASOUND, LEFT, SLEEP APNEA;  Surgeon: Vida Rigger, MD;  Location: [facility] ORS;  Service: Thoracic;  Laterality: Left;   VIDEO BRONCHOSCOPY WITH ENDOBRONCHIAL ULTRASOUND Right 05/14/2021   Procedure: VIDEO BRONCHOSCOPY WITH ENDOBRONCHIAL ULTRASOUND;  Surgeon: Salena Saner, MD;  Location: [facility] OR[facility];  Service: Pulmonary;  Laterality: Right;    HEMATOLOGY/ONCOLOGY HISTORY:  Oncology History Overview Note  #October 2020-stage III lung cancer-chemoradiation; currently on maintenance durvalumab.   Malignant neoplasm of upper lobe of left lung (HCC)  12/27/2018 Initial Diagnosis   Malignant neoplasm of upper lobe of left lung (HCC)   12/27/2018 Cancer Staging   Staging form: Lung, AJCC 8th Edition - Clinical stage from 12/27/2018: Stage IIIA (cT1b, cN2, cM0) - Signed by Creig Hines, MD on 12/27/2018   01/14/2019 - 03/12/2019 Chemotherapy   The patient had dexamethasone (DECADRON) 4 MG tablet, 8 mg, Oral, Daily, 1 of 1 cycle, Start date: 12/28/2018, End date: 03/22/2019 palonosetron (ALOXI) injection 0.25 mg, 0.25 mg, Intravenous,  Once, 7 of 7 cycles Administration:  0.25 mg (01/14/2019), 0.25 mg (01/21/2019), 0.25 mg (01/28/2019), 0.25 mg (02/04/2019), 0.25 mg (02/12/2019), 0.25 mg (02/19/2019), 0.25 mg (03/12/2019) CARBOplatin (PARAPLATIN) 170 mg in sodium chloride 0.9 % 100 mL chemo infusion, 170 mg (100 % of original dose 171.2 mg), Intravenous,  Once, 7 of 7 cycles Dose modification:   (original dose 171.2 mg, Cycle 1) Administration: 170 mg (01/14/2019), 170 mg (01/21/2019), 170 mg (01/28/2019), 170 mg (02/04/2019), 170 mg (02/12/2019), 170 mg (02/19/2019), 130 mg (03/12/2019) PACLitaxel (TAXOL) 84 mg in sodium chloride 0.9 % 250 mL chemo infusion (</= 80mg /m2), 45 mg/m2 = 84 mg, Intravenous,  Once, 7 of 7 cycles Administration: 84 mg (01/14/2019), 84 mg (01/21/2019), 84 mg (01/28/2019), 84 mg (02/04/2019), 84 mg (02/12/2019), 84 mg (02/19/2019), 84 mg (03/12/2019)  for chemotherapy treatment.    04/04/2019 - 04/30/2020 Chemotherapy   Patient is on Treatment Plan : LUNG DURVALUMAB Q14D     05/26/2021 - 10/27/2021 Chemotherapy   Patient is on Treatment Plan : LUNG NSCLC Pemetrexed + Carboplatin q21d x 4 Cycles     11/14/2021 -  Chemotherapy   Patient is on Treatment Plan : LUNG NSCLC  Pemetrexed + Carboplatin q21d x 3 Cycles       ALLERGIES:  is allergic to aspirin, celebrex [celecoxib], morphine and codeine, adhesive [tape], clarithromycin, codeine, darvon [propoxyphene hcl], demerol [meperidine], flonase [fluticasone propionate], simvastatin, and talwin [pentazocine].  MEDICATIONS:  Current Outpatient Medications  Medication Sig Dispense Refill   acetaminophen (TYLENOL) 500 MG tablet Take 500 mg by mouth 2 (two) times daily.     amiodarone (PACERONE) 400 MG tablet Take 400 mg by mouth daily.     apixaban (ELIQUIS) 5 MG TABS tablet Take 1 tablet (5 mg total) by mouth 2 (two) times daily. 60 tablet 0   benzonatate (TESSALON) 100 MG capsule Take 1 capsule (100 mg total) by mouth 3 (three) times daily as needed for cough. 60 capsule 0   Bioflavonoid Products (ESTER C  PO) Take 1 tablet by mouth daily. 500 mg     Calcium-Magnesium-Zinc (CAL-MAG-ZINC PO) Take 1 tablet by mouth daily.     cyclobenzaprine (FLEXERIL) 10 MG tablet Take 10 mg by mouth at bedtime.     ezetimibe (ZETIA) 10 MG tablet Take 5 mg by mouth in the morning and at bedtime.     glycerin adult 2 g suppository Place 1 suppository rectally as needed for constipation.     Histamine Dihydrochloride (AUSTRALIAN DREAM ARTHRITIS) 0.025 % CREA Apply 1 application  topically 4 (four) times daily as needed (pain).     HYDROcodone-acetaminophen (NORCO/VICODIN) 5-325 MG tablet Take 1 tablet by mouth at bedtime. 30 tablet 0   ipratropium-albuterol (DUONEB) 0.5-2.5 (3) MG/3ML SOLN Take 3 mLs by nebulization every 6 (six) hours as needed. 360 mL 0   loratadine (CLARITIN) 10 MG tablet Take 10 mg by mouth daily.     metoprolol tartrate (LOPRESSOR) 25 MG tablet Take 0.5 tablets (12.5 mg total) by mouth 2 (two) times daily. (Patient not taking: Reported on 02/21/2023) 30 tablet 2   ondansetron (ZOFRAN) 8 MG tablet Take 1 tablet (8 mg total) by mouth every 8 (eight) hours as needed for nausea or vomiting. (Patient not taking: Reported on 02/21/2023) 20 tablet 0   pantoprazole (PROTONIX) 40 MG tablet Take 40 mg by mouth 2 (two) times daily.     Polyethyl Glyc-Propyl Glyc PF (SYSTANE HYDRATION PF) 0.4-0.3 % SOLN Place 1 drop into both eyes as needed. as     polyethylene glycol powder (GLYCOLAX/MIRALAX) 17 GM/SCOOP powder Take 17 g by mouth daily as needed for moderate constipation. (Patient not taking: Reported on 02/21/2023)     Potassium Chloride ER 20 MEQ TBCR TAKE 1 TABLET BY MOUTH DAILY 30 tablet 0   Sennosides (SENNA) 8.6 MG CAPS Take 1 capsule by mouth daily as needed (constipation).     spironolactone (ALDACTONE) 50 MG tablet Hold until followup with outpatient provider due to acute kidney injury. (Patient taking differently: Take 50 mg by mouth once.)     torsemide (DEMADEX) 20 MG tablet Hold until followup  with outpatient provider due to acute kidney injury. (Patient taking differently: Take 20 mg by mouth once.)     triamcinolone (NASACORT) 55 MCG/ACT AERO nasal inhaler Place 2 sprays into the nose daily as needed.     UBRELVY 100 MG TABS Take 100 mg by mouth daily as needed (migraines).     Wheat Dextrin (EQ FIBER POWDER PO) Take 1 Dose by mouth as needed.     No current facility-administered medications for this visit.    VITAL SIGNS: There were no vitals taken for this visit. There were no  vitals filed for this visit.  Estimated body mass index is 28.53 kg/m as calculated from the following:   Height as of an earlier encounter on 02/21/23: 5\' 1"  (1.549 m).   Weight as of an earlier encounter on 02/21/23: 151 lb (68.5 kg).  LABS: CBC:    Component Value Date/Time   WBC 6.0 02/21/2023 1034   HGB 11.1 (L) 02/21/2023 1034   HGB 9.6 (L) 07/25/2022 0901   HGB 12.2 09/22/2011 0018   HCT 35.7 (L) 02/21/2023 1034   HCT 37.0 09/22/2011 0018   PLT 327 02/21/2023 1034   PLT 228 07/25/2022 0901   PLT 241 09/22/2011 0018   MCV 87.5 02/21/2023 1034   MCV 93 09/22/2011 0018   NEUTROABS 4.3 12/05/2022 0904   LYMPHSABS 0.6 (L) 12/05/2022 0904   MONOABS 0.8 12/05/2022 0904   EOSABS 0.1 12/05/2022 0904   BASOSABS 0.0 12/05/2022 0904   Comprehensive Metabolic Panel:    Component Value Date/Time   NA 136 02/21/2023 1034   NA 144 09/22/2011 0018   K 3.9 02/21/2023 1034   K 4.7 09/22/2011 0018   CL 94 (L) 02/21/2023 1034   CL 106 09/22/2011 0018   CO2 31 02/21/2023 1034   CO2 29 09/22/2011 0018   BUN 18 02/21/2023 1034   BUN 20 (H) 09/22/2011 0018   CREATININE 0.80 02/21/2023 1034   CREATININE 0.62 07/25/2022 0901   CREATININE 0.62 09/22/2011 0018   GLUCOSE 135 (H) 02/21/2023 1034   GLUCOSE 105 (H) 09/22/2011 0018   CALCIUM 9.3 02/21/2023 1034   CALCIUM 9.2 09/22/2011 0018   AST 23 12/05/2022 0904   AST 26 07/25/2022 0901   ALT 17 12/05/2022 0904   ALT 13 07/25/2022 0901   ALT  20 09/22/2011 0018   ALKPHOS 93 12/05/2022 0904   ALKPHOS 103 09/22/2011 0018   BILITOT 0.5 12/05/2022 0904   BILITOT 0.4 07/25/2022 0901   PROT 6.5 12/05/2022 0904   PROT 6.4 09/22/2011 0018   ALBUMIN 3.6 12/05/2022 0904   ALBUMIN 3.8 09/22/2011 0018    RADIOGRAPHIC STUDIES: US THORACENTESIS ASP PLEURAL SPACE W/IMG GUIDE  Result Date: 01/24/2023 INDICATION: Patient with a history of lung cancer presents today with a left pleural effusion. Diagnostic and therapeutic thoracentesis requested. EXAM: ULTRASOUND GUIDED THORACENTESIS MEDICATIONS: 1% lidocaine 10 mL COMPLICATIONS: None immediate. PROCEDURE: An ultrasound guided thoracentesis was thoroughly discussed with the patient and questions answered. The benefits, risks, alternatives and complications were also discussed. The patient understands and wishes to proceed with the procedure. Written consent was obtained. Ultrasound was performed to localize and mark an adequate pocket of fluid in the left chest. The area was then prepped and draped in the normal sterile fashion. 1% Lidocaine was used for local anesthesia. Under ultrasound guidance a 6 Fr Safe-T-Centesis catheter was introduced. Thoracentesis was performed. The catheter was removed and a dressing applied. FINDINGS: A total of approximately 1.2 L of amber colored fluid was removed. Samples were sent to the laboratory as requested by the clinical team. IMPRESSION: Successful ultrasound guided left thoracentesis yielding 1.2 L of pleural fluid. Procedure performed by Alwyn Ren NP Electronically Signed   By: Marliss Coots M.D.   On: 01/24/2023 13:57   DG Chest Port 1 View  Result Date: 01/24/2023 CLINICAL DATA:  77 year old female status post left thoracentesis. History of lung cancer. EXAM: PORTABLE CHEST - 1 VIEW COMPARISON:  09/19/2022, 01/18/2023 FINDINGS: Unchanged cardiomediastinal silhouette. Unchanged position of right jugular vein single-lumen Port-A-Cath with catheter tip  near  the cavoatrial junction. Similar appearing extensive right greater than left perihilar scarring with blunting of the bilateral costophrenic angles, right greater than left. No evidence of left pneumothorax. No acute osseous abnormality. Lower cervical ACDF hardware in place. IMPRESSION: 1. No evidence of pneumothorax after left thoracentesis. Trace residual left pleural effusion. 2. Similar appearing right greater than left bilateral perihilar scarring. Electronically Signed   By: Marliss Coots M.D.   On: 01/24/2023 13:54    PERFORMANCE STATUS (ECOG) : 2 - Symptomatic, <50% confined to bed  Review of Systems Unless otherwise noted, a complete review of systems is negative.  Physical Exam General: NAD Pulmonary: Unlabored Extremities: no edema, no joint deformities Skin: no rashes Neurological: Weakness but otherwise nonfocal  IMPRESSION: Patient seen today by Dr. Smith Robert.  Patient was an add-on to my clinic schedule today to address symptom management.  Patient recently enrolled in hospice care at home.  Patient reports generalized pain.  Historically, she is taking Norco at bedtime as needed.  However, patient says that she is now having more pain during the day.  Patient has tried Norco 1 tablet during the day but does not find it to be particularly helpful.  Discussed liberalizing dosing and frequency of Norco.  Patient does voice some concerns that some of her Norco tablets went missing after daughter hired a Optometrist.  Patient says that that person is no longer in the home.  Discussed safe storage of pain medications including recommended use of a lock box.  Also communicated with hospice physician.  PLAN: -Best supportive care -Hospice at home -Liberalize Norco 1 to 2 tablets every 6 hours as needed #30 -Daily bowel regimen -Follow-up as needed  Case and plan discussed with Dr. Smith Robert  Patient expressed understanding and was in agreement with this plan. She also  understands that She can call the clinic at any time with any questions, concerns, or complaints.     Time Total: 15 minutes  Visit consisted of counseling and education dealing with the complex and emotionally intense issues of symptom management and palliative care in the setting of serious and potentially life-threatening illness.Greater than 50%  of this time was spent counseling and coordinating care related to the above assessment and plan.  Signed by: Laurette Schimke, PhD, NP-C

## 2023-03-12 ENCOUNTER — Other Ambulatory Visit: Payer: Self-pay | Admitting: Oncology

## 2023-03-13 ENCOUNTER — Encounter: Payer: Self-pay | Admitting: Oncology

## 2023-03-13 NOTE — Telephone Encounter (Signed)
Component Ref Range & Units (hover) 2 wk ago (02/21/23) 3 mo ago (12/13/22) 3 mo ago (12/08/22) 3 mo ago (12/05/22) 3 mo ago (11/15/22) 4 mo ago (10/24/22) 5 mo ago (10/10/22)  Potassium 3.9 4.4 2.6 Low Panic  CM 2.5 Low Panic  CM 3.7 3.5 3.6

## 2023-03-16 ENCOUNTER — Encounter: Payer: Self-pay | Admitting: Oncology

## 2023-04-04 ENCOUNTER — Encounter: Payer: Self-pay | Admitting: Oncology

## 2023-04-05 ENCOUNTER — Encounter: Payer: Self-pay | Admitting: Oncology

## 2023-04-14 ENCOUNTER — Telehealth: Payer: Self-pay | Admitting: Hospice and Palliative Medicine

## 2023-04-14 NOTE — Telephone Encounter (Signed)
Received a call from hospice nurse, Harless Litten.  Per Vernona Rieger, patient is having more shortness of breath.  Vital stable.  Likely multifactorial due to progressive lung cancer and/or COPD.  Discussed maximizing use of her her prescribed/nebulizers.  Patient has history of intolerance to morphine which caused vomiting. Can utilize Norco and lorazepam. Could retry morphine with addition of antiemetics if patient agreeable.

## 2023-04-19 ENCOUNTER — Other Ambulatory Visit: Payer: Self-pay | Admitting: Nurse Practitioner

## 2023-05-13 DEATH — deceased
